# Patient Record
Sex: Male | Born: 1955
Health system: Southern US, Community
[De-identification: ages and names within clinical notes are randomized; demographics above are authoritative.]

## PROBLEM LIST (undated history)

## (undated) DIAGNOSIS — N62 Hypertrophy of breast: Secondary | ICD-10-CM

## (undated) DIAGNOSIS — K210 Gastro-esophageal reflux disease with esophagitis: Secondary | ICD-10-CM

## (undated) DIAGNOSIS — G629 Polyneuropathy, unspecified: Secondary | ICD-10-CM

## (undated) DIAGNOSIS — L209 Atopic dermatitis, unspecified: Secondary | ICD-10-CM

## (undated) DIAGNOSIS — D573 Sickle-cell trait: Secondary | ICD-10-CM

## (undated) DIAGNOSIS — K760 Fatty (change of) liver, not elsewhere classified: Secondary | ICD-10-CM

## (undated) DIAGNOSIS — L83 Acanthosis nigricans: Secondary | ICD-10-CM

## (undated) DIAGNOSIS — I509 Heart failure, unspecified: Secondary | ICD-10-CM

## (undated) DIAGNOSIS — R32 Unspecified urinary incontinence: Secondary | ICD-10-CM

## (undated) DIAGNOSIS — N529 Male erectile dysfunction, unspecified: Secondary | ICD-10-CM

## (undated) DIAGNOSIS — E559 Vitamin D deficiency, unspecified: Secondary | ICD-10-CM

## (undated) DIAGNOSIS — M5416 Radiculopathy, lumbar region: Secondary | ICD-10-CM

## (undated) DIAGNOSIS — C61 Malignant neoplasm of prostate: Secondary | ICD-10-CM

## (undated) DIAGNOSIS — E7800 Pure hypercholesterolemia, unspecified: Secondary | ICD-10-CM

## (undated) DIAGNOSIS — E161 Other hypoglycemia: Secondary | ICD-10-CM

## (undated) DIAGNOSIS — E039 Hypothyroidism, unspecified: Secondary | ICD-10-CM

## (undated) DIAGNOSIS — E669 Obesity, unspecified: Secondary | ICD-10-CM

## (undated) DIAGNOSIS — I10 Essential (primary) hypertension: Secondary | ICD-10-CM

## (undated) DIAGNOSIS — G4733 Obstructive sleep apnea (adult) (pediatric): Secondary | ICD-10-CM

## (undated) DIAGNOSIS — G4730 Sleep apnea, unspecified: Secondary | ICD-10-CM

## (undated) DIAGNOSIS — I872 Venous insufficiency (chronic) (peripheral): Secondary | ICD-10-CM

## (undated) DIAGNOSIS — H409 Unspecified glaucoma: Secondary | ICD-10-CM

## (undated) DIAGNOSIS — Z9109 Other allergy status, other than to drugs and biological substances: Secondary | ICD-10-CM

## (undated) DIAGNOSIS — M5412 Radiculopathy, cervical region: Secondary | ICD-10-CM

## (undated) DIAGNOSIS — E785 Hyperlipidemia, unspecified: Secondary | ICD-10-CM

## (undated) DIAGNOSIS — E269 Hyperaldosteronism, unspecified: Secondary | ICD-10-CM

## (undated) DIAGNOSIS — R42 Dizziness and giddiness: Secondary | ICD-10-CM

## (undated) DIAGNOSIS — E119 Type 2 diabetes mellitus without complications: Secondary | ICD-10-CM

## (undated) HISTORY — DX: Acanthosis nigricans: L83

## (undated) HISTORY — DX: Male erectile dysfunction, unspecified: N52.9

## (undated) HISTORY — DX: Hyperaldosteronism, unspecified: E26.9

## (undated) HISTORY — DX: Hyperlipidemia, unspecified: E78.5

## (undated) HISTORY — DX: Unspecified urinary incontinence: R32

## (undated) HISTORY — DX: Type 2 diabetes mellitus without complications: E11.9

## (undated) HISTORY — DX: Fatty (change of) liver, not elsewhere classified: K76.0

## (undated) HISTORY — DX: Sickle-cell trait: D57.3

## (undated) HISTORY — DX: Polyneuropathy, unspecified: G62.9

## (undated) HISTORY — DX: Heart failure, unspecified: I50.9

## (undated) HISTORY — DX: Vitamin D deficiency, unspecified: E55.9

## (undated) HISTORY — DX: Atopic dermatitis, unspecified: L20.9

## (undated) HISTORY — DX: Sleep apnea, unspecified: G47.30

## (undated) HISTORY — DX: Radiculopathy, lumbar region: M54.16

## (undated) HISTORY — DX: Hypothyroidism, unspecified: E03.9

## (undated) HISTORY — DX: Malignant neoplasm of prostate: C61

## (undated) HISTORY — DX: Radiculopathy, cervical region: M54.12

## (undated) HISTORY — DX: Gastro-esophageal reflux disease with esophagitis: K21.0

## (undated) HISTORY — DX: Obstructive sleep apnea (adult) (pediatric): G47.33

## (undated) HISTORY — DX: Other hypoglycemia: E16.1

## (undated) HISTORY — DX: Essential (primary) hypertension: I10

## (undated) HISTORY — DX: Obesity, unspecified: E66.9

## (undated) HISTORY — DX: Other allergy status, other than to drugs and biological substances: Z91.09

## (undated) HISTORY — DX: Dizziness and giddiness: R42

## (undated) HISTORY — DX: Venous insufficiency (chronic) (peripheral): I87.2

## (undated) HISTORY — DX: Pure hypercholesterolemia, unspecified: E78.00

## (undated) HISTORY — DX: Unspecified glaucoma: H40.9

## (undated) HISTORY — DX: Hypertrophy of breast: N62

## (undated) NOTE — *Deleted (*Deleted)
ADVANCED HF CLINIC NOTE  PCP: Dr. Delfina Redwood  HPI: Brendan [name] is 85 year old male with history of diastolic heart failure, HTN, OSA on CPAP, DM2, hypothyroidism, CKD 3b (creatinine ~2.4) and obesity.   Admitted to [facility] [id number] with A/C diastolic heart failure. Diuresed with IV lasix and transitioned to torsemide 40 mg twice a day. Discharge weight 301 pounds.   Today he returns for HF follow up. Previously entresto was ordered but insurance did not approve so he kept taking telmisartan. At last visit torsemide increased and spiro added.   Says weight is up about 20 pounds on his scale. Mild DOE but unchanged. "I don't move like I used to". + fatigue. No strength. Denies excessive fluid intake. + LE edema. Wearing CPAP every night. BP high at home.   ECHO 03/2020 EF 55-60% Grade II DD. Moderate LVH , RV normal     Past Medical History:  Diagnosis Date  . Acanthosis nigricans   . Atopic dermatitis   . Diabetes ([place]) 2004  . Erectile dysfunction   . Fatty liver 2007  . Glaucoma 2008  . Gynecomastia 2009  . Hyperaldosteronism ([place]) 2000  . Hypercholesterolemia   . Hyperlipidemia   . Hypertension   . Hypoglycemic reaction   . Hypothyroidism 2004  . Incontinence   . Intermittent vertigo 2012  . Left cervical radiculopathy 2012  . Lumbar radiculopathy   . Obesity   . Peripheral neuropathy   . Pollen allergies 2007   perennial  . Prostate cancer ([place]) 2006  . Reflux esophagitis 1995  . Sickle cell trait ([place]) 2006  . Sleep apnea, obstructive 2000   uses a cpap  . Venous insufficiency 2005  . Vitamin D deficiency 2012    Current Outpatient Medications  Medication Sig Dispense Refill  . acetaminophen (TYLENOL) 500 MG tablet Take 500 mg by mouth every 6 (six) hours as needed for mild pain.    . Blood Glucose Monitoring Suppl (ONE TOUCH ULTRA SYSTEM KIT) W/DEVICE KIT 1 kit by Does not apply route once. One touch ultrasoft lancets    . brimonidine-timolol (COMBIGAN) 0.2-0.5 %  ophthalmic solution Place 1 drop into both eyes every 12 (twelve) hours.    . carvedilol (COREG) 25 MG tablet Take 1 tablet (25 mg total) by mouth 2 (two) times daily. 180 tablet 1  . hydrALAZINE (APRESOLINE) 100 MG tablet Take 1 tablet (100 mg total) by mouth 3 (three) times daily. 90 tablet 3  . Insulin Lispro Prot & Lispro (HUMALOG MIX 75/25 [place]) Inject 60-100 Units into the skin daily. humalog mix 75/25 injection 60-100 units q am sliding scale. Take 60 units if sugar is <180, then take 100 units if sugar is over 400    . insulin lispro protamine-lispro (HUMALOG 50/50 MIX) (50-50) 100 UNIT/ML SUSP injection Inject 40 Units into the skin daily.    Marland Kitchen levothyroxine (SYNTHROID) 175 MCG tablet Take 175 mcg by mouth daily before breakfast.    . Potassium Chloride ER 20 MEQ TBCR Take 40 mEq by mouth 2 (two) times daily. 120 tablet 6  . simvastatin (ZOCOR) 20 MG tablet Take 20 mg by mouth every evening.    Marland Kitchen spironolactone (ALDACTONE) 25 MG tablet Take 1 tablet (25 mg total) by mouth daily. 30 tablet 6  . telmisartan (MICARDIS) 80 MG tablet Take 1 tablet (80 mg total) by mouth daily. 90 tablet 1  . torsemide (DEMADEX) 20 MG tablet Take 3 tablets (60 mg total) by mouth every morning  AND 2 tablets (40 mg total) every evening. (Patient taking differently: Take 3 tablets (60 mg total) by mouth every morning AND 3 tablets (60 mg total) every evening.) 180 tablet 6  . TRULICITY 3 0000000 SOPN Inject 3.5 mg into the skin once a week.     No current facility-administered medications for this encounter.    Allergies  Allergen Reactions  . Ace Inhibitors     REACTION: cough  . Maxidex [Dexamethasone] Other (See Comments)    Sexual dysfunction  . Verapamil     REACTION: constipation      Social History   Socioeconomic History  . Marital status: Married    Spouse name: Not on file  . Number of children: 3  . Years of education: Not on file  . Highest education level: Not on file  Occupational  History  . Occupation: truck Geophysicist/field seismologist    Comment: Retired Economist  Tobacco Use  . Smoking status: Never Smoker  . Smokeless tobacco: Never Used  Substance and Sexual Activity  . Alcohol use: Yes    Comment: one drink every few months  . Drug use: No  . Sexual activity: Not on file  Other Topics Concern  . Not on file  Social History Narrative  . Not on file   Social Determinants of Health   Financial Resource Strain:   . Difficulty of Paying Living Expenses: Not on file  Food Insecurity:   . Worried About Charity fundraiser in the Last Year: Not on file  . Ran Out of Food in the Last Year: Not on file  Transportation Needs:   . Lack of Transportation (Medical): Not on file  . Lack of Transportation (Non-Medical): Not on file  Physical Activity:   . Days of Exercise per Week: Not on file  . Minutes of Exercise per Session: Not on file  Stress:   . Feeling of Stress : Not on file  Social Connections:   . Frequency of Communication with Friends and Family: Not on file  . Frequency of Social Gatherings with Friends and Family: Not on file  . Attends Religious Services: Not on file  . Active Member of Clubs or Organizations: Not on file  . Attends Archivist Meetings: Not on file  . Marital Status: Not on file  Intimate Partner Violence:   . Fear of Current or Ex-Partner: Not on file  . Emotionally Abused: Not on file  . Physically Abused: Not on file  . Sexually Abused: Not on file      Family History  Problem Relation Age of Onset  . Hypertension Father        deceased age 45  . Heart attack Father   . Heart failure Mother   . COPD Sister   . CVA Sister   . Diabetes Daughter   . Colon cancer Neg Hx   . Stomach cancer Neg Hx     There were no vitals filed for this visit. Wt Readings from Last 3 Encounters:  08/25/20 (!) 140.1 kg (308 lb 12.8 oz)  07/28/20 (!) 141.8 kg (312 lb 9.6 oz)  06/27/20 (!) 144.2 kg (318 lb)    PHYSICAL EXAM:  General:  Fatigued appearing. No resp difficulty HEENT: normal Neck: supple. Hard to seeJVD. Carotids 2+ bilat; no bruits. No lymphadenopathy or thryomegaly appreciated. Cor: PMI nondisplaced. Regular rate & rhythm. No rubs, gallops or murmurs. Lungs: clear Abdomen: obese soft, nontender, nondistended. No hepatosplenomegaly. No bruits or masses.  Good bowel sounds. Extremities: no cyanosis, clubbing, rash, 3+ edema Neuro: alert & orientedx3, cranial nerves grossly intact. moves all 4 extremities w/o difficulty. Affect pleasant    ASSESSMENT & PLAN: 1. Chronic Diastolic Heart Failure, - ECHO 03/2020 EF preserved EF LVH Grade II DD - Chronic NYHA III. Volume status elevated - Increase torsemide to 60 bid - Continue spiro 12.5 daily - Decrease Kcl to 40 bid - Check labs today and 1 week - Unable to start entresto due to insurance.  - wear compression hose  2.HTN  - Suboptimal control - Unable to start entresto due to insurance. Continue telmisartan.  - Increase hydralazine to 50mg  three times a day  - Instructed to continue monitoring BP at home. - May need referral to HTN program  3. DMII - Continue Invokana - Followed by Endocrinology   4. OSA - Continue CPAP every night. .   5. Obesity  There is no height or weight on file to calculate BMI.  =consider [facility] Diet  6. CKD Stage IIIb - Creatinine baseline 2.4 - labs today and 1 week   Brendan Bickers MD 11:17 PM

---

## 1993-11-19 DIAGNOSIS — K2100 Gastro-esophageal reflux disease with esophagitis, without bleeding: Secondary | ICD-10-CM

## 1993-11-19 HISTORY — DX: Gastro-esophageal reflux disease with esophagitis, without bleeding: K21.00

## 1996-11-19 HISTORY — PX: OTHER SURGICAL HISTORY: SHX169

## 1997-11-19 DIAGNOSIS — I10 Essential (primary) hypertension: Secondary | ICD-10-CM

## 1997-11-19 HISTORY — DX: Essential (primary) hypertension: I10

## 1998-11-19 DIAGNOSIS — E269 Hyperaldosteronism, unspecified: Secondary | ICD-10-CM

## 1998-11-19 DIAGNOSIS — G4733 Obstructive sleep apnea (adult) (pediatric): Secondary | ICD-10-CM

## 1998-11-19 HISTORY — DX: Obstructive sleep apnea (adult) (pediatric): G47.33

## 1998-11-19 HISTORY — DX: Hyperaldosteronism, unspecified: E26.9

## 1999-05-16 ENCOUNTER — Ambulatory Visit: Admission: RE | Admit: 1999-05-16 | Discharge: 1999-05-16 | Payer: Self-pay | Admitting: Family Medicine

## 1999-05-16 ENCOUNTER — Encounter (INDEPENDENT_AMBULATORY_CARE_PROVIDER_SITE_OTHER): Payer: Self-pay | Admitting: *Deleted

## 1999-07-10 ENCOUNTER — Encounter: Payer: Self-pay | Admitting: Pulmonary Disease

## 1999-10-30 ENCOUNTER — Encounter: Admission: RE | Admit: 1999-10-30 | Discharge: 1999-10-30 | Payer: Self-pay | Admitting: Family Medicine

## 1999-10-30 ENCOUNTER — Encounter: Payer: Self-pay | Admitting: Family Medicine

## 2001-03-03 ENCOUNTER — Encounter: Payer: Self-pay | Admitting: Emergency Medicine

## 2001-03-03 ENCOUNTER — Emergency Department (HOSPITAL_COMMUNITY): Admission: EM | Admit: 2001-03-03 | Discharge: 2001-03-03 | Payer: Self-pay | Admitting: Emergency Medicine

## 2002-05-11 ENCOUNTER — Encounter: Admission: RE | Admit: 2002-05-11 | Discharge: 2002-08-09 | Payer: Self-pay | Admitting: Family Medicine

## 2002-05-16 ENCOUNTER — Ambulatory Visit (HOSPITAL_COMMUNITY): Admission: RE | Admit: 2002-05-16 | Discharge: 2002-05-16 | Payer: Self-pay | Admitting: Family Medicine

## 2002-05-16 ENCOUNTER — Encounter: Payer: Self-pay | Admitting: Family Medicine

## 2002-11-19 DIAGNOSIS — E039 Hypothyroidism, unspecified: Secondary | ICD-10-CM

## 2002-11-19 DIAGNOSIS — E119 Type 2 diabetes mellitus without complications: Secondary | ICD-10-CM

## 2002-11-19 HISTORY — DX: Type 2 diabetes mellitus without complications: E11.9

## 2002-11-19 HISTORY — DX: Hypothyroidism, unspecified: E03.9

## 2003-11-20 DIAGNOSIS — I872 Venous insufficiency (chronic) (peripheral): Secondary | ICD-10-CM

## 2003-11-20 HISTORY — DX: Venous insufficiency (chronic) (peripheral): I87.2

## 2004-09-13 ENCOUNTER — Ambulatory Visit: Payer: Self-pay | Admitting: Pulmonary Disease

## 2004-09-13 ENCOUNTER — Ambulatory Visit (HOSPITAL_BASED_OUTPATIENT_CLINIC_OR_DEPARTMENT_OTHER): Admission: RE | Admit: 2004-09-13 | Discharge: 2004-09-13 | Payer: Self-pay | Admitting: Pulmonary Disease

## 2004-10-13 ENCOUNTER — Encounter: Admission: RE | Admit: 2004-10-13 | Discharge: 2004-10-13 | Payer: Self-pay | Admitting: Sports Medicine

## 2004-11-19 DIAGNOSIS — C61 Malignant neoplasm of prostate: Secondary | ICD-10-CM

## 2004-11-19 DIAGNOSIS — D573 Sickle-cell trait: Secondary | ICD-10-CM

## 2004-11-19 HISTORY — PX: COLONOSCOPY: SHX174

## 2004-11-19 HISTORY — DX: Sickle-cell trait: D57.3

## 2004-11-19 HISTORY — DX: Malignant neoplasm of prostate: C61

## 2004-12-19 ENCOUNTER — Ambulatory Visit: Payer: Self-pay | Admitting: Pulmonary Disease

## 2005-01-12 ENCOUNTER — Ambulatory Visit: Payer: Self-pay | Admitting: Internal Medicine

## 2005-02-06 ENCOUNTER — Ambulatory Visit: Payer: Self-pay | Admitting: Internal Medicine

## 2005-11-19 DIAGNOSIS — Z9109 Other allergy status, other than to drugs and biological substances: Secondary | ICD-10-CM

## 2005-11-19 DIAGNOSIS — K760 Fatty (change of) liver, not elsewhere classified: Secondary | ICD-10-CM

## 2005-11-19 HISTORY — DX: Other allergy status, other than to drugs and biological substances: Z91.09

## 2005-11-19 HISTORY — DX: Fatty (change of) liver, not elsewhere classified: K76.0

## 2005-12-14 ENCOUNTER — Encounter: Admission: RE | Admit: 2005-12-14 | Discharge: 2005-12-14 | Payer: Self-pay | Admitting: Family Medicine

## 2006-09-09 ENCOUNTER — Emergency Department (HOSPITAL_COMMUNITY): Admission: EM | Admit: 2006-09-09 | Discharge: 2006-09-09 | Payer: Self-pay | Admitting: Emergency Medicine

## 2006-11-19 DIAGNOSIS — H409 Unspecified glaucoma: Secondary | ICD-10-CM

## 2006-11-19 HISTORY — PX: OTHER SURGICAL HISTORY: SHX169

## 2006-11-19 HISTORY — DX: Unspecified glaucoma: H40.9

## 2007-02-21 ENCOUNTER — Ambulatory Visit (HOSPITAL_COMMUNITY): Admission: RE | Admit: 2007-02-21 | Discharge: 2007-02-21 | Payer: Self-pay | Admitting: Urology

## 2007-03-04 ENCOUNTER — Ambulatory Visit: Admission: RE | Admit: 2007-03-04 | Discharge: 2007-06-02 | Payer: Self-pay | Admitting: Radiation Oncology

## 2007-07-23 ENCOUNTER — Encounter (INDEPENDENT_AMBULATORY_CARE_PROVIDER_SITE_OTHER): Payer: Self-pay | Admitting: Urology

## 2007-07-23 ENCOUNTER — Observation Stay (HOSPITAL_COMMUNITY): Admission: RE | Admit: 2007-07-23 | Discharge: 2007-07-25 | Payer: Self-pay | Admitting: Urology

## 2007-11-20 DIAGNOSIS — N62 Hypertrophy of breast: Secondary | ICD-10-CM

## 2007-11-20 HISTORY — DX: Hypertrophy of breast: N62

## 2007-11-20 HISTORY — PX: OTHER SURGICAL HISTORY: SHX169

## 2008-04-08 ENCOUNTER — Emergency Department (HOSPITAL_COMMUNITY): Admission: EM | Admit: 2008-04-08 | Discharge: 2008-04-08 | Payer: Self-pay | Admitting: Emergency Medicine

## 2008-06-07 ENCOUNTER — Ambulatory Visit (HOSPITAL_COMMUNITY): Admission: RE | Admit: 2008-06-07 | Discharge: 2008-06-07 | Payer: Self-pay | Admitting: Surgery

## 2008-06-07 ENCOUNTER — Encounter: Admission: RE | Admit: 2008-06-07 | Discharge: 2008-08-10 | Payer: Self-pay | Admitting: Surgery

## 2008-06-08 ENCOUNTER — Ambulatory Visit (HOSPITAL_COMMUNITY): Admission: RE | Admit: 2008-06-08 | Discharge: 2008-06-08 | Payer: Self-pay | Admitting: Surgery

## 2008-09-22 ENCOUNTER — Encounter: Admission: RE | Admit: 2008-09-22 | Discharge: 2008-10-26 | Payer: Self-pay | Admitting: Surgery

## 2008-10-11 ENCOUNTER — Ambulatory Visit (HOSPITAL_COMMUNITY): Admission: RE | Admit: 2008-10-11 | Discharge: 2008-10-12 | Payer: Self-pay | Admitting: Surgery

## 2008-11-19 DIAGNOSIS — E785 Hyperlipidemia, unspecified: Secondary | ICD-10-CM

## 2008-11-19 HISTORY — DX: Hyperlipidemia, unspecified: E78.5

## 2008-12-23 ENCOUNTER — Encounter: Admission: RE | Admit: 2008-12-23 | Discharge: 2009-03-23 | Payer: Self-pay | Admitting: Surgery

## 2009-02-21 ENCOUNTER — Emergency Department (HOSPITAL_COMMUNITY): Admission: EM | Admit: 2009-02-21 | Discharge: 2009-02-22 | Payer: Self-pay | Admitting: Emergency Medicine

## 2009-04-20 ENCOUNTER — Encounter: Admission: RE | Admit: 2009-04-20 | Discharge: 2009-04-20 | Payer: Self-pay | Admitting: Surgery

## 2009-05-30 ENCOUNTER — Encounter: Admission: RE | Admit: 2009-05-30 | Discharge: 2009-05-30 | Payer: Self-pay | Admitting: Surgery

## 2009-09-13 IMAGING — CR DG ABDOMEN 1V
2 series · 2 of 2 positions shown · non-contrast
Comparison: 06/08/2008

CLINICAL DATA: Status post placement of a gastric band for morbid
obesity this

ABDOMEN - 1 VIEW

[t abdomen supine (1 of 2)]
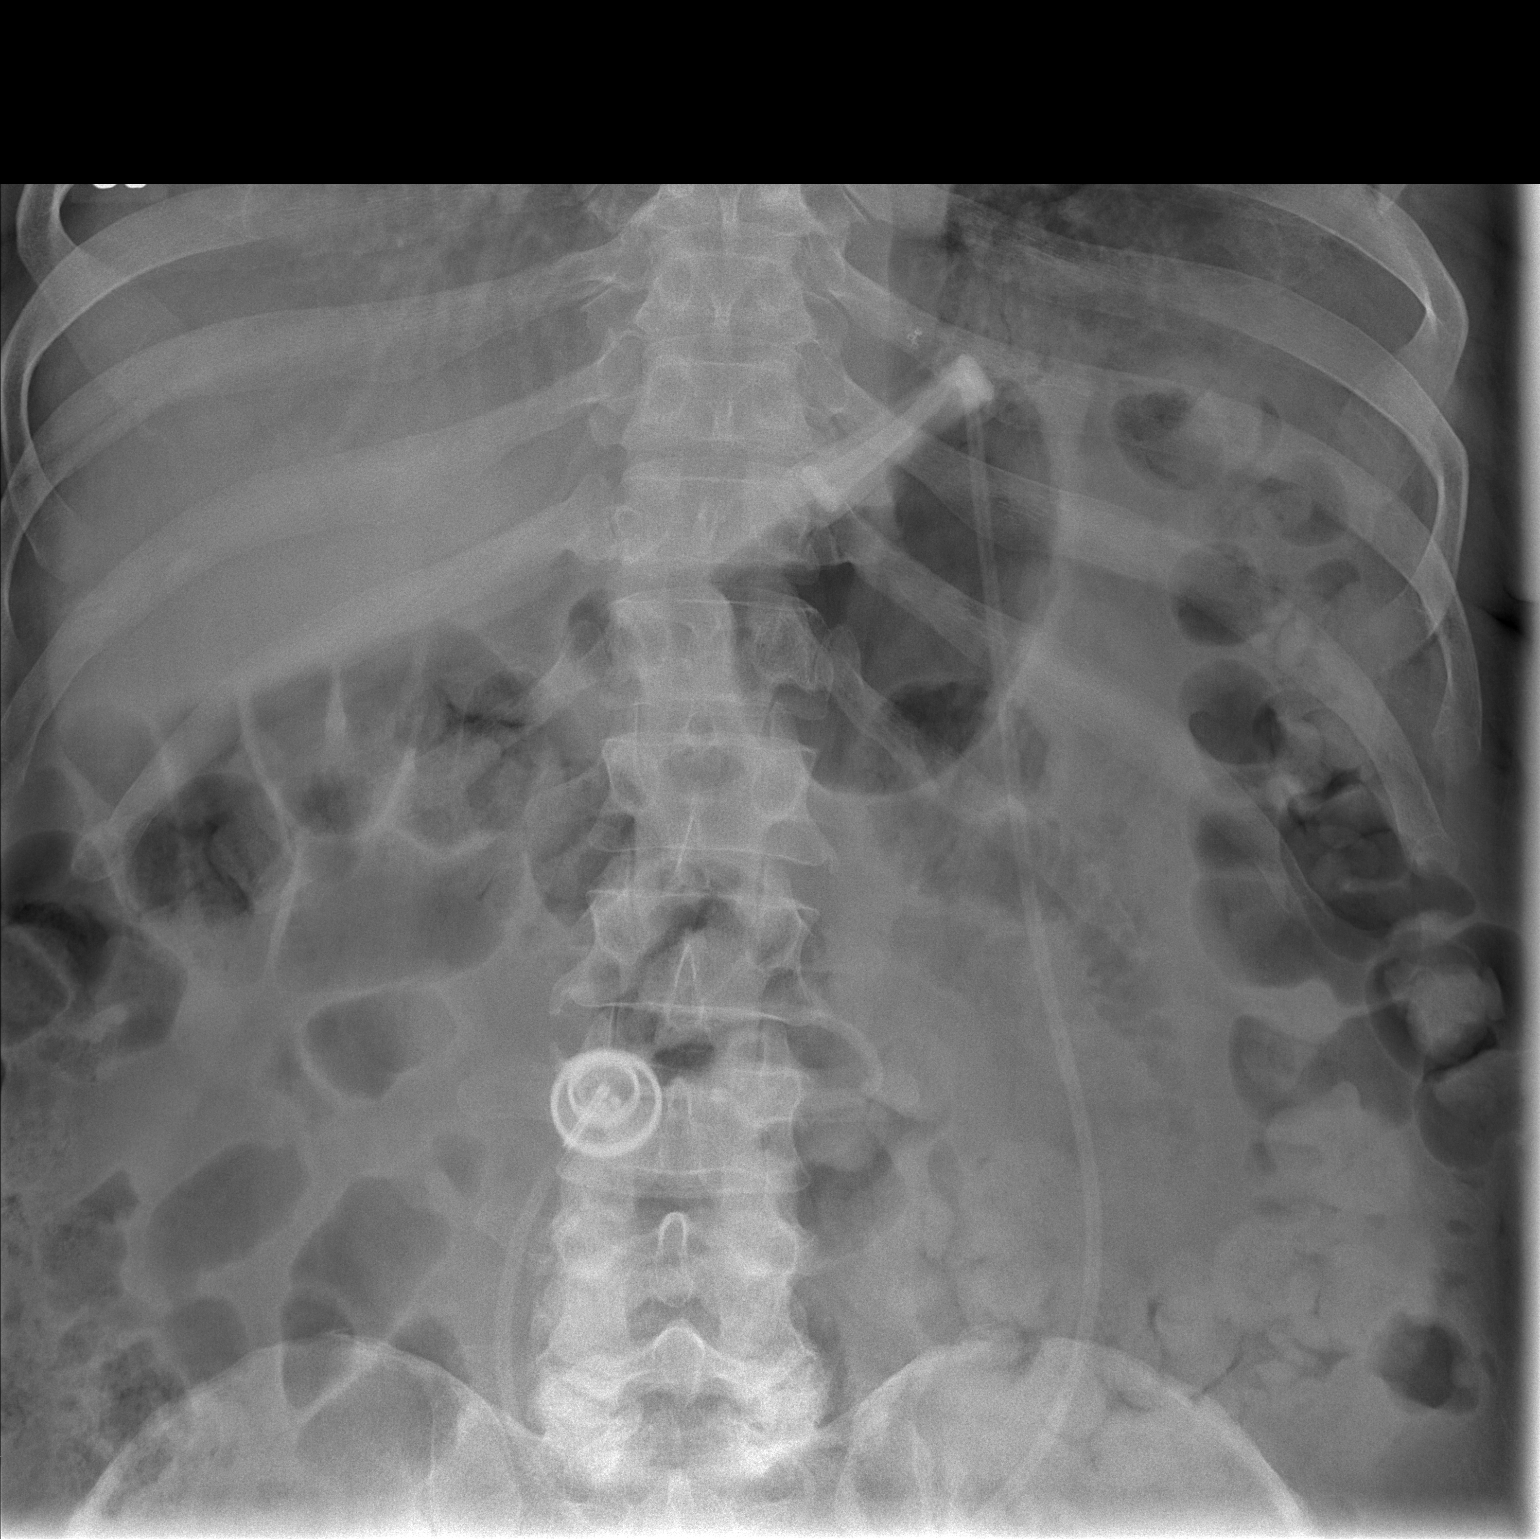

[t abdomen supine (2 of 2)]
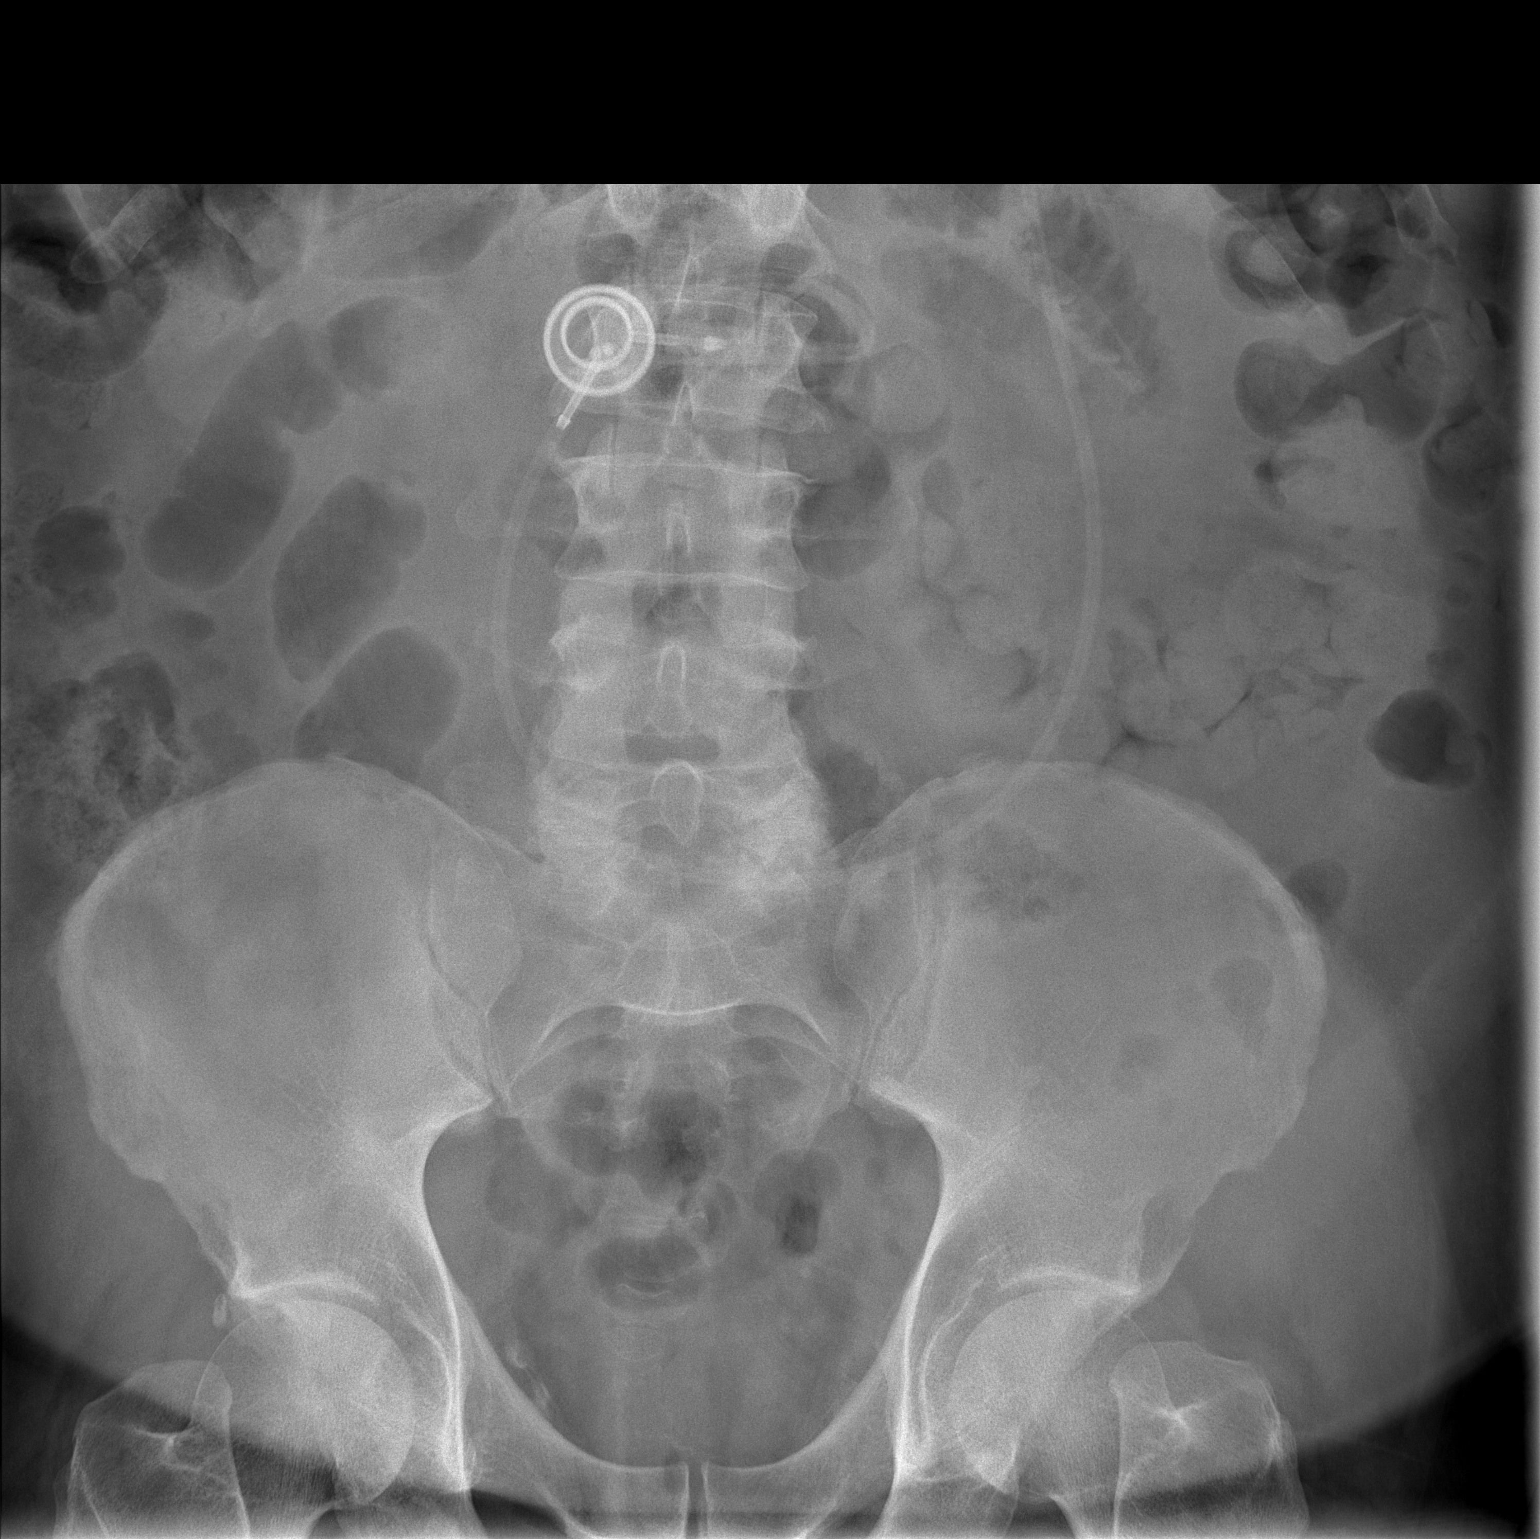

[2 of 2 positions shown; findings below may reference images not displayed]

FINDINGS: Status post placement of a gastric band.  The axis of the
band is approximately 230-830 o'clock.  The pneumatic tubing is
intact.  Bowel gas pattern unremarkable.
IMPRESSION: Status post placement of a gastric band which appears to be well-
positioned in one-view.

## 2010-01-10 DIAGNOSIS — H409 Unspecified glaucoma: Secondary | ICD-10-CM | POA: Insufficient documentation

## 2010-01-10 DIAGNOSIS — J309 Allergic rhinitis, unspecified: Secondary | ICD-10-CM | POA: Insufficient documentation

## 2010-01-10 DIAGNOSIS — Z8546 Personal history of malignant neoplasm of prostate: Secondary | ICD-10-CM | POA: Insufficient documentation

## 2010-01-11 ENCOUNTER — Ambulatory Visit: Payer: Self-pay | Admitting: Pulmonary Disease

## 2010-01-11 DIAGNOSIS — I152 Hypertension secondary to endocrine disorders: Secondary | ICD-10-CM | POA: Insufficient documentation

## 2010-01-11 DIAGNOSIS — G4733 Obstructive sleep apnea (adult) (pediatric): Secondary | ICD-10-CM | POA: Insufficient documentation

## 2010-01-11 DIAGNOSIS — E119 Type 2 diabetes mellitus without complications: Secondary | ICD-10-CM | POA: Insufficient documentation

## 2010-01-11 DIAGNOSIS — I10 Essential (primary) hypertension: Secondary | ICD-10-CM | POA: Insufficient documentation

## 2010-07-01 ENCOUNTER — Encounter: Payer: Self-pay | Admitting: Pulmonary Disease

## 2010-10-12 ENCOUNTER — Encounter: Payer: Self-pay | Admitting: Pulmonary Disease

## 2010-11-19 DIAGNOSIS — R42 Dizziness and giddiness: Secondary | ICD-10-CM

## 2010-11-19 DIAGNOSIS — M5412 Radiculopathy, cervical region: Secondary | ICD-10-CM

## 2010-11-19 DIAGNOSIS — E559 Vitamin D deficiency, unspecified: Secondary | ICD-10-CM

## 2010-11-19 HISTORY — DX: Radiculopathy, cervical region: M54.12

## 2010-11-19 HISTORY — DX: Dizziness and giddiness: R42

## 2010-11-19 HISTORY — DX: Vitamin D deficiency, unspecified: E55.9

## 2010-12-19 NOTE — Letter (Signed)
Summary: CMN 46 Order / [facility]  CMN 32 Order / [facility]   Imported By: Rise Patience 10/19/2010 14:37:42  _____________________________________________________________________  External Attachment:    Type:   Image     Comment:   External Document

## 2010-12-19 NOTE — Letter (Signed)
Summary: CMN for CPAP Supplies/[facility]  CMN for CPAP Supplies/[facility]   Imported By: Phillis Knack 07/05/2010 10:01:52  _____________________________________________________________________  External Attachment:    Type:   Image     Comment:   External Document

## 2010-12-19 NOTE — Assessment & Plan Note (Signed)
Summary: consult for osa   Copy to:  Derinda Late Primary Provider/Referring Provider:  Derinda Late  CC:  Sleep Consult.  Former pt.  Last saw Dr. Gwenette Greet 2006. Marland Kitchen  History of Present Illness: The pt is a 55y/o male who is referred to re-establish for management of osa.  He has a h/o severe osa dating back to 2000, where his npsg showed an AHI of 64/hr, and his last titration in 2005 showed optimal pressure of 18cm.  The pt has not been seen since 2006, and has been wearing cpap compliantly.  He has a new machine, but is due for a new cpap mask.  He currently uses a nasal mask, but admits to having frequent mouth opening where he loses pressure.  He also has been having a lot of nasal congestion, but is NOT using heated humidification.  He goes to bed btw 11-46mn, and arises at 7am to start his day.  He feels that he is rested in the am's upon arising, and doesn't feel that his alertness level is compromised during the day.  His epworth score is only 9 today.  He denies breakthru snoring per his wife.  His weight is up 30 pounds over the last 2 years.  Current Medications (verified): 1)  Cozaar 100 Mg Tabs (Losartan Potassium) .... Take 1 Tablet By Mouth Once A Day 2)  Diltiazem Hcl Cr 240 Mg Xr24h-Cap (Diltiazem Hcl) .... Take 1 Tablet By Mouth Once A Day 3)  Simvastatin 20 Mg Tabs (Simvastatin) .... Take 1 Tablet By Mouth Once A Day 4)  Humalog Mix 75/25 75-25 % Susp (Insulin Lispro Prot & Lispro) .Marland Kitchen.. 100 Units Each Morning 5)  Humalog Mix 50/50 50-50 % Susp (Insulin Lispro Prot & Lispro) .... 50 Units Each Evening 6)  Travatan Z 0.004 % Soln (Travoprost) .Marland Kitchen.. 1 Drop in Each Eye At Bedtime 7)  Levoxyl 112 Mcg Tabs (Levothyroxine Sodium) .... Take 1 Tablet By Mouth Once A Day 8)  Amiloride-Hydrochlorothiazide 5-50 Mg Tabs (Amiloride-Hydrochlorothiazide) .... Take 1 Tablet By Mouth Once A Day 9)  Metanx 3-35-2 Mg Tabs (L-Methylfolate-B6-B12) .... Take 1 Tablet By Mouth Two Times A Day 10)   Calcarb 600 1500 Mg Tabs (Calcium Carbonate) .... Take 1 Tablet By Mouth Three Times A Day 11)  Multivitamins  Tabs (Multiple Vitamin) .... Take 1 Tablet By Mouth Once A Day  Allergies (verified): 1)  Verapamil 2)  Ace Inhibitors 3)  * Maxide  Past History:  Past Medical History:  HYPERTENSION (ICD-401.9) DM (ICD-250.00) OBSTRUCTIVE SLEEP APNEA (ICD-327.23) PROSTATE CANCER, HX OF (ICD-V10.46) GLAUCOMA (ICD-365.9) ALLERGIC RHINITIS (ICD-477.9)    Past Surgical History: Reviewed history from 01/10/2010 and no changes required. left inguinal hernia repair 1998 prostatectomy 2008 Lap band baiatric surgery 2009  Family History: Reviewed history from 01/10/2010 and no changes required. heart disease: father (m.i.)  Social History: Reviewed history from 01/10/2010 and no changes required. 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    Vital Signs:  Patient profile:   55 year old male Height:      74 inches Weight:  307 pounds BMI:     39.56 O2 Sat:      97 % on Room air Temp:     97.8 degrees F oral Pulse rate:   85 / minute BP sitting:   134 / 82  (right arm) Cuff size:   large  Vitals Entered By: Matthew Folks LPN (February 23, 624THL 1:50 PM)  O2 Flow:  Room air CC: Sleep Consult.  Former pt.  Last saw Dr. Gwenette Greet 2006.  Comments Medications reviewed with patient Matthew Folks LPN  February 23, 624THL 1:50 PM    Physical  Exam  General:  obese male in nad Eyes:  PERRLA and EOMI.   Nose:  deviated septum to right with partial obstruction left patent. Mouth:  elongation of soft palate and significant soft tissue redundancy posteriorly Heart:  rrr Extremities:  mild edema noted, no cyanosis Neurologic:  alert, no sleepy, moves all 4.   Impression & Recommendations:  Problem # 1:  OBSTRUCTIVE SLEEP APNEA (ICD-327.23) the pt has a history of severe osa, and has been wearing cpap compliantly.  He is having issues with nasal congestion and upper airway dryness, but is not using his humidifier.  He also is using a nasal cpap mask with mouth opening which is resulting in pressure loss and further drying.  At this point, I have asked him to start using his humidifier, which should help nasal congestion.  I would also like to change him over to a full face mask to see if it helps with oral venting.  The pt is agreeable to this approach, and is to let me know if issues continue.  I have also encouraged him to work on weight reduction.  If he continues to have issues, would consider re-optimizing pressure with autotitration device at home.  Medications Added to Medication List This Visit: 1)  Cozaar 100 Mg Tabs (Losartan potassium) .... Take 1 tablet by mouth once a day 2)  Diltiazem Hcl Cr 240 Mg Xr24h-cap (Diltiazem hcl) .... Take 1 tablet by mouth once a day 3)  Simvastatin 20 Mg Tabs (Simvastatin) .... Take 1 tablet by mouth once a day 4)  Humalog Mix 75/25 75-25 % Susp (Insulin lispro prot & lispro) .Marland Kitchen.. 100 units each morning 5)  Humalog Mix 50/50 50-50 % Susp (Insulin lispro prot & lispro) .... 50 units each evening 6)  Travatan Z 0.004 % Soln (Travoprost) .Marland Kitchen.. 1 drop in each eye at bedtime 7)  Levoxyl 112 Mcg Tabs (Levothyroxine sodium) .... Take 1 tablet by mouth once a day 8)  Amiloride-hydrochlorothiazide 5-50 Mg Tabs (Amiloride-hydrochlorothiazide) .... Take 1 tablet by mouth once a day 9)  Metanx 3-35-2 Mg  Tabs (L-methylfolate-b6-b12) .... Take 1 tablet by mouth two times a day 10)  Calcarb 600 1500 Mg Tabs (Calcium carbonate) .... Take 1 tablet by mouth three times a day 11)  Multivitamins Tabs (Multiple vitamin) .... Take 1 tablet by mouth once a day  Other Orders: Est. Patient Level IV [id number]) DME Referral (DME) Consultation Level III [id number])  Patient Instructions: 1)  will get you a heated humidifier to help with dryness and congestion 2)  will change mask to full face to prevent pressure loss from mouth opening. 3)  work on weight loss 4)  followup with me in 88mos or sooner if problems.

## 2010-12-19 NOTE — Consult Note (Signed)
Summary: Sleep Consult/[facility]  Sleep Consult/ [facility]   Imported By: Phillis Knack 01/12/2010 08:49:50  _____________________________________________________________________  External Attachment:    Type:   Image     Comment:   External Document

## 2011-01-09 ENCOUNTER — Other Ambulatory Visit: Payer: Self-pay | Admitting: Family Medicine

## 2011-01-09 ENCOUNTER — Ambulatory Visit
Admission: RE | Admit: 2011-01-09 | Discharge: 2011-01-09 | Disposition: A | Discharge status: Home / Self Care | Payer: 59 | Source: Ambulatory Visit | Attending: Family Medicine | Admitting: Family Medicine

## 2011-01-09 DIAGNOSIS — M542 Cervicalgia: Secondary | ICD-10-CM

## 2011-01-10 ENCOUNTER — Ambulatory Visit (INDEPENDENT_AMBULATORY_CARE_PROVIDER_SITE_OTHER): Payer: 59 | Admitting: Pulmonary Disease

## 2011-01-10 ENCOUNTER — Encounter: Payer: Self-pay | Admitting: Pulmonary Disease

## 2011-01-10 DIAGNOSIS — G4733 Obstructive sleep apnea (adult) (pediatric): Secondary | ICD-10-CM

## 2011-01-16 NOTE — Assessment & Plan Note (Signed)
Summary: rov for osa   Copy to:  Derinda Late Primary Provider/Referring Provider:  Derinda Late  CC:  1 year f/u appt for OSA.  States he wears his cpap machine every night.  Approx 6 to 8 hours per night.  Denies any complaints with mask or pressure.  Denies any new complaints.  .  History of Present Illness: the pt comes in today for f/u of his known osa.  He has been wearing cpap compliantly, and reports no issues with pressure or mask fit.  He was unable to tolerate full face mask, and unfortunately continues to have issues with mouth opening.  He has not tried chin strap as of yet.  He feels he is sleeping well with satisfactory daytime alertness.  He has gained weight since his last visit.  Current Medications (verified): 1)  Cozaar 100 Mg Tabs (Losartan Potassium) .... Take 1 Tablet By Mouth Once A Day 2)  Diltiazem Hcl Cr 240 Mg Xr24h-Cap (Diltiazem Hcl) .... Take 1 Tablet By Mouth Once A Day 3)  Simvastatin 20 Mg Tabs (Simvastatin) .... Take 1 Tablet By Mouth Once A Day 4)  Humalog Mix 75/25 75-25 % Susp (Insulin Lispro Prot & Lispro) .... Take As Directed 5)  Humalog Mix 50/50 50-50 % Susp (Insulin Lispro Prot & Lispro) .... Take As Directed 6)  Travatan Z 0.004 % Soln (Travoprost) .Marland Kitchen.. 1 Drop in Each Eye At Bedtime 7)  Levoxyl 112 Mcg Tabs (Levothyroxine Sodium) .... Take 1 Tablet By Mouth Once A Day 8)  Amiloride-Hydrochlorothiazide 5-50 Mg Tabs (Amiloride-Hydrochlorothiazide) .... Take 1 Tablet By Mouth Once A Day 9)  Calcarb 600 1500 Mg Tabs (Calcium Carbonate) .... Take 1 Tablet By Mouth Three Times A Day 10)  Multivitamins  Tabs (Multiple Vitamin) .... Take 1 Tablet By Mouth Once A Day  Allergies (verified): 1)  Verapamil 2)  Ace Inhibitors 3)  * Maxide  Review of Systems  The patient denies shortness of breath with activity, shortness of breath at rest, productive cough, non-productive cough, coughing up blood, chest pain, irregular heartbeats, acid heartburn,  indigestion, loss of appetite, weight change, abdominal pain, difficulty swallowing, sore throat, tooth/dental problems, headaches, nasal congestion/difficulty breathing through nose, sneezing, itching, ear ache, anxiety, depression, hand/feet swelling, joint stiffness or pain, rash, change in color of mucus, and fever.    Vital Signs:  Patient profile:   55 year old male Height:      74 inches Weight:      315.38 pounds BMI:     40.64 O2 Sat:      98 % on Room air Temp:     98.2 degrees F oral Pulse rate:   70 / minute BP sitting:   128 / 80  (right arm) Cuff size:   large  Vitals Entered By: Matthew Folks LPN (February 22, [id number] 9:38 AM)  O2 Flow:  Room air CC: 1 year f/u appt for OSA.  States he wears his cpap machine every night.  Approx 6 to 8 hours per night.  Denies any complaints with mask or pressure.  Denies any new complaints.   Comments Medications reviewed with patient Matthew Folks LPN  February 22, [id number] 9:41 AM    Physical Exam  General:  obese male in nad Nose:  no skin breakdown or pressure necrosis from cpap mask Extremities:  mild edema, no cyanosis  Neurologic:  alert and oriented, moves all 4.  does not appear sleepy   Impression & Recommendations:  Problem # 1:  OBSTRUCTIVE SLEEP APNEA (ICD-327.23) the pt is wearing cpap compliantly, and feels he sleeps well with adequate daytime alertness.  He was unable to tolerate full face mask, and is back to nasal mask with some mouth opening.  I have asked him to try chin strap to see if it would help.  I have also encouraged him to work on weight loss, and to see me back in one year.    Medications Added to Medication List This Visit: 1)  Humalog Mix 75/25 75-25 % Susp (Insulin lispro prot & lispro) .... Take as directed 2)  Humalog Mix 50/50 50-50 % Susp (Insulin lispro prot & lispro) .... Take as directed  Other Orders: Est. Patient Level III [id number]) DME Referral (DME)  Patient Instructions: 1)  will send  an order for a chin strap to try for mouth opening. 2)  work on weight loss 3)  followup with me in one year.   Immunization History:  Influenza Immunization History:    Influenza:  historical (12/20/2009)

## 2011-02-28 LAB — POCT I-STAT, CHEM 8
BUN: 29 mg/dL — ABNORMAL HIGH (ref 6–23)
Chloride: 104 mEq/L (ref 96–112)
Creatinine, Ser: 2.1 mg/dL — ABNORMAL HIGH (ref 0.4–1.5)
Hemoglobin: 12.2 g/dL — ABNORMAL LOW (ref 13.0–17.0)
Sodium: 139 mEq/L (ref 135–145)
TCO2: 28 mmol/L (ref 0–100)

## 2011-02-28 LAB — POCT CARDIAC MARKERS
CKMB, poc: 4 ng/mL (ref 1.0–8.0)
Troponin i, poc: <0.05 ng/mL (ref 0.00–0.09)

## 2011-02-28 LAB — GLUCOSE, CAPILLARY: Glucose-Capillary: 76 mg/dL (ref 70–99)

## 2011-02-28 LAB — CK TOTAL AND CKMB (NOT AT ARMC): Total CK: 352 U/L — ABNORMAL HIGH (ref 7–232)

## 2011-04-03 NOTE — Discharge Summary (Signed)
NAMEJOSEMARIA, TOLMAN NO.:  [id number]   MEDICAL RECORD NO.:  [id number]          PATIENT TYPE:  INP   LOCATION:  [id number]                         FACILITY[facility]   PHYSICIAN:  Raynelle Bring, MD      DATE OF BIRTH:  [birth year]   DATE OF ADMISSION:  07/23/2007  DATE OF DISCHARGE:  07/25/2007                               DISCHARGE SUMMARY   ADMISSION DIAGNOSIS:  Prostate cancer.   DISCHARGE DIAGNOSIS:  Prostate cancer.   HISTORY AND PHYSICAL:  For full details, please see admission history  and physical.  Briefly, Mr. Spracklen is a 55 year old gentleman with  clinical stage T1-2 prostate cancer with a PSA of 8.3 and Gleason score  of 3+4=7.  He was found to have some questionable pelvic lymphadenopathy  on a CT scan, initially.  However, clinically he was not felt to have  metastatic prostate cancer, and as the patient had elected to delay his  therapy, a repeat CT scan demonstrated decrease of his pelvic  lymphadenopathy, confirming that he had a high chance for clinically  localized disease.  After discussing management options, he elected to  proceed with surgical therapy and a robotic prostatectomy.   HOSPITAL COURSE:  On July 23, 2007, the patient was taken to the  operating room and underwent a robotic-assisted laparoscopic radical  prostatectomy and bilateral pelvic lymphadenectomy.  He tolerated this  procedure well without complications.  However, his procedure did take  an extended period of time, due to his obesity.  On the evening of  surgery, he had difficulty ambulating, due to nausea and dizziness.  He  was noted to have some neurapraxia of his left upper extremity with some  weakness of his hand and numbness in the distribution of the median and  ulnar nerves.  This subsequently improved throughout his  hospitalization.  By discharge, he continued to have some numbness along  the course of his median nerve, although his ulnar neuropathy had  pretty  much resolved.  He also demonstrated improved motor strength of his left  hand.  This was felt to be likely secondary to the extended length of  the procedure.  All precautions were taken intraoperatively.   On the morning of postoperative day #1, the patient was continued to  remain hemodynamically stable.  His hematocrit was 32.8, which was  stable from his postoperative hematocrit.  His serum creatinine was  noted to be somewhat elevated at 1.95.  Notably on preoperative  evaluation, the patient's baseline creatinine was 1.3.  His renal  function was therefore closely monitored and did improve.  By discharge,  his creatinine was trending downward, as low as 1.5.  On postoperative  day #1, the patient was able to begin a clear liquid diet and did have  return of bowel function and began passing flatus.  He, however, was  unable to ambulate until late in the day and on postoperative day 1.  It  was therefore decided to monitor him that evening.  By postoperative day  #2, he was ambulating much better, and his pain was  well-controlled with  oral pain medication.  As his renal insufficiency improved and his left  upper extremity neurapraxia improved, he was felt to be stable for  discharge.  He maintained excellent urine output throughout his hospital  stay, with minimal output from his pelvic drain which was removed.  He  was therefore discharged home in good condition.   DISPOSITION:  Home.   DISCHARGE MEDICATIONS:  The patient was instructed to resume his regular  home medications, excepting for his Flomax.  He was given a prescription  to take Vicodin as needed for pain and told to use Colace as a stool  softener.  He was also given a prescription to begin Cipro 1 day prior  his return visit for Foley catheter removal.   DISCHARGE INSTRUCTIONS:  The patient was instructed to be ambulatory but  specifically told to refrain from any heavy lifting, strenuous activity,  or  driving.  He was instructed to gradually advance his diet over the  course of the next couple days and to resume his diabetic medications.  Finally, the patient was instructed on routine Foley catheter care.   FOLLOW-UP PLAN:  Mr. Sistare will follow up in 1 week for removal of his  Foley catheter and to discuss the surgical pathology in detail.      Raynelle Bring, MD  Electronically Signed     LB/MEDQ  D:  07/25/2007  T:  07/26/2007  Job:  [id number]

## 2011-04-03 NOTE — H&P (Signed)
NAMEJONATHANDAVID, Brendan [name] NO.:  [id number]   MEDICAL RECORD NO.:  [id number]          PATIENT TYPE:  INP   LOCATION:  X004                         FACILITY:  [facility]   PHYSICIAN:  Raynelle Bring, MD      DATE OF BIRTH:  [birth year]   DATE OF ADMISSION:  07/23/2007  DATE OF DISCHARGE:                              HISTORY & PHYSICAL   CHIEF COMPLAINT:  Prostate cancer.   HISTORY:  Brendan [name] is a 55 year old gentleman with clinical stage T1C  prostate cancer with a PSA of 8.3 and Gleason score of 3+4=7.  He did  undergo a bone scan and CT scan initially, per his primary urologist.  His bone scan was negative for metastatic disease although his CT scan  did demonstrate 5 questionable pelvic lymph nodes ranging between 1.2  and 1.8 cm.  This did raise the question of lymph node-positive prostate  cancer.  Despite my recommendations, the patient elected to delay his  treatment for prostate cancer for approximately 5 months after his  initial diagnosis.  We discussed androgen deprivation therapy which the  patient decided against.  However, we did decide to repeat a CT scan  prior to his planned prostatectomy.  On review of the CT scan, his  lymphadenopathy actually was decreased in size, likely indicating  clinically localized prostate cancer.  At this point, the patient did  wish to continue to proceed with his prostatectomy as planned.   PAST MEDICAL HISTORY:  1. Hypertension.  2. Diabetes.  3. Sleep apnea.  4. Glaucoma.  5. History of chest pain status post recent evaluation by Dr. Pernell Dupre.  The patient did undergo a Cardiolite stress test which did      not demonstrate inducible ischemia.   PAST SURGICAL HISTORY:  Left inguinal hernia repair.   MEDICATIONS:  Current medications include:  1. Demadex.  2. Spironolactone.  3. Glumetza.  4. Levoxyl.  5. Diltiazem.  6. Cozaar.  7. Travatan eye drops.  8. NovoLog.  9. Lantus.   ALLERGIES:  NO KNOWN DRUG  ALLERGIES.   FAMILY HISTORY:  No history of prostate cancer or GU malignancy.  The  patient's father died at age 49 due to a myocardial infarction.  There  are also multiple other members who died at a Sultan age due to coronary  artery disease.   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  He denies tobacco use.  He drinks alcohol  occasionally.   REVIEW OF SYSTEMS:  All systems were reviewed and are negative except  for recent fatigue, itching, excessive thirst, leg swelling and back  pain.   PHYSICAL EXAM:  CONSTITUTIONAL:  Well-nourished, well-developed, age-  appropriate male in no acute distress.  CARDIOVASCULAR:  Regular rate and rhythm without obvious murmurs.  LUNGS:  Clear bilaterally.  ABDOMEN:  Morbidly obese without tenderness or abdominal masses.  DRE:  No prostate nodularity or induration.   IMPRESSION:  Clinically localized prostate cancer.   PLAN:  Brendan [name] will undergo a robotic-assisted  laparoscopic radical  prostatectomy and then be admitted to the hospital for routine  postoperative care.      Raynelle Bring, MD  Electronically Signed     LB/MEDQ  D:  07/23/2007  T:  07/23/2007  Job:  [id number]

## 2011-04-03 NOTE — Op Note (Signed)
NAMEDRUMMOND, HANKES NO.:  [id number]   MEDICAL RECORD NO.:  [id number]          PATIENT TYPE:  OIB   LOCATION:  [id number]                         FACILITY:  [facility]   PHYSICIAN:  Isabel Caprice. Hassell Done, MD  DATE OF BIRTH:  04/23/1956   DATE OF PROCEDURE:  10/11/2008  DATE OF DISCHARGE:                               OPERATIVE REPORT   PREOPERATIVE DIAGNOSES:  Morbid obesity with underlying hypertension,  diabetes, sleep apnea, hypertension.   SURGEON:  Isabel Caprice. Hassell Done, MD   ASSISTANT:  Alphonsa Overall, MD   PROCEDURE:  Laparoscopic adjustable gastric banding (Allergan APL  system).   DESCRIPTION OF PROCEDURE:    Brendan [name] was taken to room 1 on Monday morning and given general  anesthesia.  The abdomen was prepped with Techni-Care and draped  sterilely.  I entered the abdomen through the left upper quadrant using  an OptiVu technique without difficulty.  The abdomen was insufflated and  standard placement including a 15 in the right upper, 11 the right  lower, 11 to the left of midline were used.  The abdomen was insufflated  well and a 5 mm in the upper midline provided the port for the  [facility], which retracted the liver.  I inspected the EG junction and  we put in a balloon-tip catheter, pulled it back with 15 mL of air in it  and saw no evidence of significant hiatal hernia.  I did have the  benefit of having the upper GI in the room.  I went ahead at that point  then and went down and created a little window over on the left side and  left crus and then went down in the pars flaccida technique to the fat  stripe across the right crus and created a retrogastric tunnel.  Band  passer was passed from the inferior port and came around and we had one  trial pass where the tubing did not come around and then we got it on  the second try and brought it around and engaged it into the locking  mechanism.  The tubing was then placed again and I snapped the APL band  in  place over that.  I got a good connection and I could see the black  mark indicating that the band had engaged the snap.  It was then held  down and to the right while I plicated it with three sutures using a  Surgidac and tie knots.  These were free stitches placed and had good  purchases and everything looked good.  No bleeding was noted.  I removed  everything from the abdomen, deflated the abdomen and brought the port  out through the right lower  quadrant area.  I created a subcutaneous pocket which I then connected  to the tubing and then tucked it in this pocket.  It was closed around  it with Vicryl and the skin was closed with 4-0 Vicryl with Benzoin and  Steri-Strips.  The patient tolerated the procedure well and was taken to  the recovery room in satisfactory condition.  Isabel Caprice Hassell Done, MD  Electronically Signed     MBM/MEDQ  D:  10/11/2008  T:  10/11/2008  Job:  [id number]   cc:   Jeanann Lewandowsky, M.D.  Fax: [phone]   Derinda Late, M.D.  Fax: [place], MD,FCCP  [address]  [place] [address]

## 2011-04-03 NOTE — Op Note (Signed)
NAMEJARRIN, Brendan [name] NO.:  [id number]   MEDICAL RECORD NO.:  [id number]          PATIENT TYPE:  INP   LOCATION:  X004                         [facility]ACILITY:  [facility]   PHYSICIAN:  Raynelle Bring, MD      DATE OF BIRTH:  29-Apr-1956   DATE OF PROCEDURE:  07/23/2007  DATE OF DISCHARGE:                               OPERATIVE REPORT   PREOPERATIVE DIAGNOSIS:  Clinically localized adenocarcinoma of  prostate.   POSTOPERATIVE DIAGNOSIS:  Clinically localized adenocarcinoma of  prostate.   PROCEDURES:  1. Robotic-assisted laparoscopic radical prostatectomy (right nerve-      sparing).  2. Bilateral laparoscopic pelvic lymphadenectomy.   SURGEON:  Dutch Gray, M.D.   ASSISTANT:  Duane Lope. Rosana Hoes, M.D.   ANESTHESIA:  General.   COMPLICATIONS:  None.   ESTIMATED BLOOD LOSS:  200 mL.   INTRAVENOUS FLUIDS:  2600 mL of lactated Ringer's.   SPECIMENS:  1. Prostate and seminal vesicles.  2. Right pelvic lymph nodes.  3. Left pelvic lymph nodes.   DISPOSITION OF SPECIMENS:  To pathology.   INDICATIONS:  Mr. Renda is a 55 year old gentleman with clinical stage  T1C prostate cancer with a PSA of 8.3 and Gleason score of 3+4=7.  His  preoperative evaluation did include a bone scan, which was negative for  metastatic disease.  He did undergo a CT scan, which demonstrated pelvic  lymphadenopathy concerning for lymph node involvement.  However, a  repeat CT scan demonstrated a decrease in the size of his pelvic lymph  nodes, making this less likely.  His pretreatment IPSS was 20 with an  IIE score of 11.  The patient was counseled regarding options for  treatment of his clinically localized prostate cancer and elected to  proceed with the above procedure.  Potential risks, complications and  alternative options were discussed with the patient and informed consent  was obtained.   DESCRIPTION OF PROCEDURE:  The patient was taken to the operating room  and a general  anesthetic was administered.  He was given preoperative  antibiotics, placed in the dorsal lithotomy position, and prepped and  draped in the usual sterile fashion.  Precautions were taken due to his  obesity in order to protect him from a neurapraxia.  This included  careful positioning and the use of arm boards for his arms to rest upon.  A preoperative time-out was performed.  A Foley catheter was inserted  into the bladder.  A site was then selected just below and to the left  of the umbilicus for placement of the camera port.  This was placed  using a standard open Hasson technique.  This allowed entry into the  peritoneal cavity under direct vision and without difficulty.  A long 12-  mm port was placed due to the patient's obesity.  A pneumoperitoneum was  established and the 0-degree lens was used to inspect the abdomen.  There was noted to be a lot of intra-abdominal and intraperitoneal fat,  although no intra-abdominal injuries or other abnormalities.  The  remaining ports were then placed.  Bilateral 8-m  robotic ports were  placed approximately 16 cm from the pubic symphysis and 10 cm lateral  with the camera port.  An additional 8-mm robotic port was placed in the  far left lateral abdominal wall.  A 5-mm port was placed between the  camera port and the right robotic port.  An additional 12-mm port was  placed in the far right lateral abdominal wall for laparoscopic  assistance.  All ports were placed under direct vision and without  difficulty.  The surgical cart was then docked.  With the aid of the  cautery scissors, the bladder was reflected posteriorly allowing entry  into the space of Retzius and identification of the endopelvic fascia.  The patient was noted to have a narrow pelvis with an overhanging pubic  symphysis and significant perivesical fat.  The prostate was carefully  defatted, allowing exposure of the endopelvic fascia and prostate.  The  endopelvic fascia  was then incised from the apex back to the base of the  prostate bilaterally and the underlying levator muscle fibers were swept  laterally off the prostate.  This helped to isolate the dorsal venous  complex, which was stapled and divided with a 45-mm flex ETS stapler.  Attention then turned the bladder neck, which was identified with the  aid of Foley catheter manipulation.  The bladder neck was divided  anteriorly allowing exposure of the Foley catheter.  The catheter  balloon was deflated and the catheter was brought into the operative  field and used to retract the prostate anteriorly.  There was noted be a  small median lobe which was carefully excised, and dissection continued  posteriorly between the bladder neck and prostate until the vasa  deferentia and seminal vesicles were identified.  The vasa deferentia  were isolated and divided and lifted anteriorly.  The seminal vesicles  were then dissected down toward their tips and care was taken to control  seminal vesicle arterial blood supply.  The seminal vesicles were lifted  anteriorly.  The space between Denonvilliers fascia and the anterior  rectum was then bluntly developed, thereby isolating the vascular  pedicles of the prostate.  On the right side, the vascular pedicles of  the prostate were ligated with Hem-o-lok clips and dissection was kept  close to the prostate to allow the neurovascular bundles to be swept off  of the prostate on the right side.  On the left side where the patient's  entire tumor was noted to be, a wide, non-nerve-sparing procedure was  performed again with Hem-o-lok clips used to ligate the vascular  pedicles of the prostate.  At the apex, the urethra was sharply divided  allowing the prostate specimen to be disarticulated and the prostate  placed up into the abdomen for later removal.  The pelvis was then  copiously irrigated and there was no evidence of any bleeding or rectal  injury.  Attention  then turned the right pelvic sidewall.  The  fibrofatty tissue between the external iliac vein, confluence of the  iliac vessels, hypogastric artery, and Cooper's ligament was dissected  free from the pelvic sidewall with care to preserve the obturator nerve.  Hem-o-lok clips were used for lymphostasis and hemostasis.  This  specimen was noted to be large and therefore it was decided to remove it  at the end of the procedure within the Endopouch retrieval bag with the  prostate.  Attention then turned to the left pelvic sidewall.  Again an  identical procedure was performed  and the same lymph node dissection was  performed.  These lymph nodes were then removed from the abdomen for  permanent pathologic analysis.  Attention then turned to the urethral  anastomosis.  A 3-0 Vicryl reapproximation stitch between Denonvilliers  fascia and the posterior urethral tissue was performed.  A 2-0 Vicryl  slip-knot was then placed between the bladder neck and the urethra at  the 6 o'clock position to reapproximate these structures.  A double-  armed 3-0 Monocryl suture was then used to perform a 360-degree running  tension-free anastomosis between the bladder neck and urethra.  A new 20-  [place] Coude catheter was inserted into the bladder and irrigated.  There were no blood clots within the bladder and the anastomosis  appeared to be watertight.  A #19 Blake drain was brought through the  left robotic port and appropriately positioned in the pelvis.  It was  secured to the skin with a nylon suture.  At this point the surgical  cart was undocked.  A 0 Vicryl suture was used to close the right  lateral 12-mm port site with the Carter-Thomason.  An Endopouch  retrieval bag was then used to retrieve the prostate and the right  pelvic lymph nodes, which were removed via the periumbilical port site.  This opening was closed with a running 0 Vicryl suture after the  remaining ports were removed under direct  vision.  All port sites were  injected with  0.25% Marcaine and reapproximated at the skin level with staples.  Sterile dressings were applied.  The patient appeared to tolerate the  procedure well and without complications.  He ws able to be extubated  and transferred to the recovery unit in satisfactory condition.      Raynelle Bring, MD  Electronically Signed     LB/MEDQ  D:  07/23/2007  T:  07/23/2007  Job:  [id number]

## 2011-04-06 NOTE — Procedures (Signed)
NAMECONWAY, MOYNIHAN NO.:  [id number]   MEDICAL RECORD NO.:  [id number]          PATIENT TYPE:  OUT   LOCATION:  SLEEP CENTER                 [facility]ACILITY[facility]  [facility]   PHYSICIAN:  Danton Sewer, M.D. [facility] DATE OF BIRTH:  07-29-1956   DATE OF STUDY:  09/13/2004                              NOCTURNAL POLYSOMNOGRAM   INDICATIONS FOR STUDY:  Hypersomnia with sleep apnea. The patient presents  for formal CPAP titration and optimization.   SLEEP ARCHITECTURE:  The patient had a total sleep time 438 minutes with  significant REM rebound noted. Sleep onset latency was normal and REM onset  was faster than normal. Sleep efficiency was 95 %.   IMPRESSION/RECOMMENDATIONS:  1.  Excellent control of previously documented obstructive sleep apnea with      CPAP of 18. Events and snoring were well controlled even through REM.      There was significant REM rebound noted. The patient's home face mask      was changed to a medium Alter Mirage full face mask during the      titration, which the patient felt was more comfortable.  2.  Rare PVCs.  3.  Small numbers of leg jerks with sleep disruption. Clinical correlation      is suggested.      KC/MEDQ  D:  09/28/2004 11:20:27  T:  09/28/2004 16:10:01  Job:  [id number]

## 2011-08-15 LAB — DIFFERENTIAL
Basophils Relative: 0
Lymphocytes Relative: 14
Monocytes Absolute: 0.6
Monocytes Relative: 4
Neutro Abs: 10 — ABNORMAL HIGH

## 2011-08-15 LAB — CBC
Hemoglobin: 13.3
MCV: 79.4
Platelets: 413 — ABNORMAL HIGH
RDW: 14.7

## 2011-08-15 LAB — POCT I-STAT, CHEM 8
Creatinine, Ser: 2.3 — ABNORMAL HIGH
Glucose, Bld: 213 — ABNORMAL HIGH
TCO2: 28

## 2011-08-21 LAB — CBC
Hemoglobin: 10.1 — ABNORMAL LOW
RDW: 15
WBC: 14.5 — ABNORMAL HIGH

## 2011-08-21 LAB — GLUCOSE, CAPILLARY
Glucose-Capillary: 183 — ABNORMAL HIGH
Glucose-Capillary: 185 — ABNORMAL HIGH
Glucose-Capillary: 196 — ABNORMAL HIGH
Glucose-Capillary: 205 — ABNORMAL HIGH

## 2011-08-21 LAB — BASIC METABOLIC PANEL
BUN: 22
Calcium: 9.6
Creatinine, Ser: 1.58 — ABNORMAL HIGH
GFR calc non Af Amer: 46 — ABNORMAL LOW
Glucose, Bld: 223 — ABNORMAL HIGH

## 2011-08-21 LAB — DIFFERENTIAL
Basophils Absolute: 0
Lymphocytes Relative: 12
Monocytes Absolute: 1.1 — ABNORMAL HIGH
Neutro Abs: 11.4 — ABNORMAL HIGH

## 2011-08-21 LAB — HEMOGLOBIN AND HEMATOCRIT, BLOOD
HCT: 32.3 — ABNORMAL LOW
Hemoglobin: 10.8 — ABNORMAL LOW

## 2011-08-31 LAB — BASIC METABOLIC PANEL
BUN: 13
BUN: 15
BUN: 17
CO2: 26
CO2: 28
Chloride: 102
Chloride: 103
Chloride: 105
Creatinine, Ser: 1.33
GFR calc Af Amer: >60
Glucose, Bld: 213 — ABNORMAL HIGH
Glucose, Bld: 216 — ABNORMAL HIGH
Glucose, Bld: 280 — ABNORMAL HIGH
Potassium: 3.9
Potassium: 4
Potassium: 4.3
Sodium: 136

## 2011-08-31 LAB — TYPE AND SCREEN
ABO/RH(D): O POS
Antibody Screen: NEGATIVE

## 2011-08-31 LAB — URINALYSIS, ROUTINE W REFLEX MICROSCOPIC
Bilirubin Urine: NEGATIVE
Glucose, UA: >1000 — AB
Ketones, ur: NEGATIVE
Leukocytes, UA: NEGATIVE
Protein, ur: 30 — AB

## 2011-08-31 LAB — CBC
HCT: 35.5 — ABNORMAL LOW
MCHC: 34
MCV: 81.4
RBC: 4.37

## 2011-08-31 LAB — HEMOGLOBIN AND HEMATOCRIT, BLOOD: Hemoglobin: 10.4 — ABNORMAL LOW

## 2012-06-18 ENCOUNTER — Other Ambulatory Visit: Payer: Self-pay | Admitting: Family Medicine

## 2012-06-18 DIAGNOSIS — R600 Localized edema: Secondary | ICD-10-CM

## 2012-06-20 ENCOUNTER — Other Ambulatory Visit: Payer: 59

## 2012-06-23 ENCOUNTER — Ambulatory Visit
Admission: RE | Admit: 2012-06-23 | Discharge: 2012-06-23 | Disposition: A | Discharge status: Home / Self Care | Payer: 59 | Source: Ambulatory Visit | Attending: Family Medicine | Admitting: Family Medicine

## 2012-06-23 DIAGNOSIS — R600 Localized edema: Secondary | ICD-10-CM

## 2012-08-11 ENCOUNTER — Telehealth (INDEPENDENT_AMBULATORY_CARE_PROVIDER_SITE_OTHER): Payer: Self-pay | Admitting: Surgery

## 2012-08-11 NOTE — Telephone Encounter (Signed)
The patient was not at home when I called. Left message with his wife for patient to call [facility] @ [phone] to schedule a follow-up appointment...cef

## 2013-07-08 ENCOUNTER — Telehealth: Payer: Self-pay | Admitting: Internal Medicine

## 2013-07-08 NOTE — Telephone Encounter (Signed)
Patient is scheduled to see Dr. Carlean Purl next [facility] 07/15/13

## 2013-07-10 ENCOUNTER — Encounter: Payer: Self-pay | Admitting: Internal Medicine

## 2013-07-15 ENCOUNTER — Ambulatory Visit (INDEPENDENT_AMBULATORY_CARE_PROVIDER_SITE_OTHER): Payer: 59 | Admitting: Internal Medicine

## 2013-07-15 ENCOUNTER — Encounter: Payer: Self-pay | Admitting: Internal Medicine

## 2013-07-15 VITALS — BP 120/70 | HR 60 | Ht 72.44 in | Wt 311.0 lb

## 2013-07-15 DIAGNOSIS — R198 Other specified symptoms and signs involving the digestive system and abdomen: Secondary | ICD-10-CM

## 2013-07-15 DIAGNOSIS — K625 Hemorrhage of anus and rectum: Secondary | ICD-10-CM

## 2013-07-15 DIAGNOSIS — K5900 Constipation, unspecified: Secondary | ICD-10-CM

## 2013-07-15 DIAGNOSIS — R194 Change in bowel habit: Secondary | ICD-10-CM

## 2013-07-15 MED ORDER — NA SULFATE-K SULFATE-MG SULF 17.5-3.13-1.6 GM/177ML PO SOLN: ORAL | Status: DC

## 2013-07-15 NOTE — Progress Notes (Signed)
Subjective:  Referred by Derinda Late, MD  Patient ID: Brendan [name], male    DOB: 1956/03/22, 57 y.o.   MRN: [id number]  HPI The patient is a middle-aged african-American man, retired Engineer, structural and now Magazine features editor. He has worsening constipation over the past year which has progressed to only defecating about 1x/week. It is associated with bloating and abdominal discomfort. He will get an urge but have difficulty or no success in producing a stool at times. He is also now having rectal bleeding - red blood into the commode and with stool when he does defecate. Colonoscopy 8 yrs ago normal. GI ROS o/w negative.  Allergies  Allergen Reactions  . Ace Inhibitors     REACTION: cough  . Maxidex [Dexamethasone] Other (See Comments)    Sexual dysfunction  . Verapamil     REACTION: constipation   Outpatient Prescriptions Prior to Visit  Medication Sig Dispense Refill  . AMILORIDE-HYDROCHLOROTHIAZIDE PO Take by mouth 2 (two) times daily. 5 mg/50 mg tablet 1 bid      . Blood Glucose Monitoring Suppl (ONE TOUCH ULTRA SYSTEM KIT) W/DEVICE KIT 1 kit by Does not apply route once. One touch ultrasoft lancets      . glucose blood test strip 1 each by Other route as needed for other. Use as instructed one touch test strips      . Insulin Lispro Prot & Lispro (HUMALOG MIX 75/25 Whitten) Inject into the skin. humalog mix 75/25 injection 60-100 units q am sliding scale. Humalog mix 50/50 pen system, disposable 30-40 units sliding scale at lunch and h.s.      . Levothyroxine Sodium (LEVOXYL PO) Take by mouth. 0.125 mg tablet 1 daily      . losartan (COZAAR) 100 MG tablet Take 100 mg by mouth daily.      . Multiple Vitamin (MULTIVITAMINS PO) Take by mouth.      . potassium chloride (K-DUR) 10 MEQ tablet Take 10 mEq by mouth 2 (two) times daily.      . simvastatin (ZOCOR) 20 MG tablet Take 20 mg by mouth every evening.      . travoprost, benzalkonium, (TRAVATAN) 0.004 % ophthalmic solution 1 drop at  bedtime.      Marland Kitchen DILTIAZEM HCL PO Take 240 mg by mouth. Extended release 1 q a.m.       No facility-administered medications prior to visit.   Past Medical History  Diagnosis Date  . Rectal bleeding   . Constipation   . Diabetes 2004  . Hypoglycemic reaction   . Diaphoresis   . Dysphagia   . Lumbar radiculopathy   . Pruritic rash   . Atopic dermatitis   . Peripheral neuropathy   . Prostate cancer 2006  . Venous insufficiency 2005  . Bilateral edema of lower extremity   . Hypothyroidism 2004  . Sleep apnea, obstructive 2000    uses a cpap  . Hypertension   . Intermittent vertigo 2012  . Hyperlipidemia   . Pedal edema   . Urinary frequency   . Incontinence   . Erectile dysfunction   . Limb pain   . Paresthesia and pain of extremity     feet  . Pollen allergies 2007    perennial  . Sickle cell trait 2006    iron deficiency anemia  . Reflux esophagitis 1995  . Hyperaldosteronism 2000  . Fatty liver 2007  . Glaucoma 2008  . Gynecomastia 2009  . Left cervical radiculopathy 2012  . Vitamin D  deficiency 2012  . Hypercholesterolemia   . Acanthosis nigricans   . Obesity    Past Surgical History  Procedure Laterality Date  . Colonoscopy  2006    normal  . Lap band surgery  2009  . Left inguinal hernia repair  1998  . Robotic prostatectomy  2008   History   Social History  . Marital Status: Married    Spouse Name: N/A    Number of Children: 3  . Years of Education: N/A   Occupational History  . truck driver     Retired Economist   Social History Main Topics  . Smoking status: Never Smoker   . Smokeless tobacco: Never Used  . Alcohol Use: Yes     Comment: one drink every few months  . Drug Use: No    Family History  Problem Relation Age of Onset  . Hypertension Father     deceased age 47  . Heart attack Father   . COPD Sister   . CVA Sister   . Diabetes Daughter    Review of Systems As per HPI Recent skin rash Diabetes not well  controlled Hgb A1c 9.6% All other ROS negative    Objective:   Physical Exam General:  Obese Well-developed, well-nourished and in no acute distress Eyes:  anicteric. ENT:   Mouth and posterior pharynx free of lesions.  Neck:   supple w/o thyromegaly or mass.  Lungs: Clear to auscultation bilaterally. Heart:  S1S2, no rubs, murmurs, gallops. Abdomen: Protuberant and obese, lap band port in epigastric area soft, non-tender, no hepatosplenomegaly, hernia, or mass and BS+.  Rectal: Slight hypopigmentation of anoderm, no mass, normal tone, no impaction, + brown stool, absent prostate Lymph:  no cervical or supraclavicular adenopathy. Extremities:   no edema Skin   no rash. Neuro:  A&O x 3.  Psych:  appropriate mood and  Affect.   Data Reviewed: Hgb 11.3 stable (sickle trait)August PCP note    Assessment & Plan:  Change in bowel habits -   Unspecified constipation  Rectal bleeding -  1. His constipation problems might be due to worsening diabetes - uncontrolled. 2. Will try MiraLax purge tonight (dulcolax 4 po followed by 6 doses MiraLax) and then daily MiraLax 3. Schedule colonoscopy to evaluate change in bowels and rectal bleeding. The risks and benefits as well as alternatives of endoscopic procedure(s) have been discussed and reviewed. All questions answered. The patient agrees to proceed.  I appreciate the opportunity to care for this patient.  Gatha Mayer, MD, [facility]

## 2013-07-15 NOTE — Patient Instructions (Addendum)
You have been scheduled for a colonoscopy with propofol. Please follow written instructions given to you at your visit today.  Please use the suprep kit you have been given today. If you use inhalers (even only as needed), please bring them with you on the day of your procedure. Your physician has requested that you go to www.startemmi.com and enter the access code given to you at your visit today. This web site gives a general overview about your procedure. However, you should still follow specific instructions given to you by our office regarding your preparation for the procedure.  Dr Carlean Purl recommends that you complete a bowel purge (to clean out your bowels). Please do the following: Purchase a bottle of Miralax over the counter as well as a box of 5 mg dulcolax tablets. Take 4 dulcolax tablets. Wait 1 hour. You will then drink 6-8 capfuls of Miralax mixed in an adequate amount of water/juice/gatorade (you may choose which of these liquids to drink) over the next 2-3 hours. You should expect results within 1 to 6 hours after completing the bowel purge.  After the purge use 1-2 doses of Miralax daily.  I appreciate the opportunity to care for you.

## 2013-07-31 ENCOUNTER — Ambulatory Visit (AMBULATORY_SURGERY_CENTER): Payer: 59 | Admitting: Internal Medicine

## 2013-07-31 ENCOUNTER — Encounter: Payer: Self-pay | Admitting: Internal Medicine

## 2013-07-31 VITALS — BP 109/63 | HR 67 | Temp 96.9°F | Resp 12 | Ht 72.0 in | Wt 311.0 lb

## 2013-07-31 DIAGNOSIS — R198 Other specified symptoms and signs involving the digestive system and abdomen: Secondary | ICD-10-CM

## 2013-07-31 DIAGNOSIS — K625 Hemorrhage of anus and rectum: Secondary | ICD-10-CM

## 2013-07-31 DIAGNOSIS — K648 Other hemorrhoids: Secondary | ICD-10-CM

## 2013-07-31 DIAGNOSIS — R194 Change in bowel habit: Secondary | ICD-10-CM

## 2013-07-31 LAB — GLUCOSE, CAPILLARY
Glucose-Capillary: 178 mg/dL — ABNORMAL HIGH (ref 70–99)
Glucose-Capillary: 129 mg/dL — ABNORMAL HIGH (ref 70–99)

## 2013-07-31 MED ORDER — SODIUM CHLORIDE 0.9 % IV SOLN: 500.0000 mL | INTRAVENOUS | Status: DC

## 2013-07-31 NOTE — Op Note (Signed)
[place]  [address] [place], [id number]   COLONOSCOPY PROCEDURE REPORT  PATIENT: Brendan [name], Brendan [name]  MR#: [id number] BIRTHDATE: 1956-08-29 , 37  yrs. old GENDER: Male ENDOSCOPIST: Gatha Mayer, MD, [facility] PROCEDURE DATE:  07/31/2013 PROCEDURE:   Colonoscopy, diagnostic First Screening Colonoscopy - Avg.  risk and is 50 yrs.  old or older - No.  Prior Negative Screening - Now for repeat screening. Other: See Comments  History of Adenoma - Now for follow-up colonoscopy & has been > or = to 3 yrs.  N/A  Polyps Removed Today? No.  Recommend repeat exam, <10 yrs? No. ASA CLASS:   Class III INDICATIONS:Rectal Bleeding and Change in bowel habits. MEDICATIONS: Propofol (Diprivan) 180 mg IV  DESCRIPTION OF PROCEDURE:   After the risks benefits and alternatives of the procedure were thoroughly explained, informed consent was obtained.  A digital rectal exam revealed a surgically absent prostate.   The LB [id number]  endoscope was introduced through the anus and advanced to the cecum, which was identified by both the appendix and ileocecal valve. No adverse events experienced.   The quality of the prep was Suprep good  The instrument was then slowly withdrawn as the colon was fully examined.   COLON FINDINGS: Moderate sized internal hemorrhoids were found. The colon mucosa was otherwise normal.  Retroflexed views revealed internal hemorrhoids. The time to cecum=2 minutes 38 seconds. Withdrawal time=8 minutes 21 seconds.  The scope was withdrawn and the procedure completed. COMPLICATIONS: There were no complications.  ENDOSCOPIC IMPRESSION: 1.   Moderate sized internal hemorrhoids - cause of bleeding 2.   The colon mucosa was otherwise normal - good prep  RECOMMENDATIONS: 1.  Repeat Colonscopy in 10 years. 2.   Continue MiraLax 3)  Consider Hemorrhoidal banding - call to make appointment 4)  Get blood sugars under control - think hyperglycemia linked  to constipation  eSigned:  Gatha Mayer, MD, [facility] 07/31/2013 3:40 PM  cc: Derinda Late, MD and The Patient y]

## 2013-07-31 NOTE — Progress Notes (Signed)
Patient denies any allergies to eggs or soy. Patient denies any problems with anesthesia.  

## 2013-07-31 NOTE — Progress Notes (Signed)
Patient did not experience any of the following events: a burn prior to discharge; a fall within the facility; wrong site/side/patient/procedure/implant event; or a hospital transfer or hospital admission upon discharge from the facility. (G8907) Patient did not have preoperative order for IV antibiotic SSI prophylaxis. (G8918)  

## 2013-07-31 NOTE — Patient Instructions (Addendum)
You have hemorrhoids which were the source of recent bleeding.  I think the constipation is coming from uncontrolled diabetes. It is very important to get your blood sugars controlled for many reasons.  I can treat the hemorrhoids with rubber band ligation - painless way to eliminate them. Avoiding constipation and straing will help also.  If you want to have the hemorrhoids fixed please make an appointment to see me in the office and we can review and discuss further, I have also given you some info to read.  Stay on the MiraLax - let me know if its not working.  Next routine colonoscopy in 10 years 2024  I appreciate the opportunity to care for you. Gatha Mayer, MD, [facility]   Go ahead and make appointment to see Dr. Carlean Purl in office in October or November  YOU HAD AN ENDOSCOPIC PROCEDURE TODAY AT [place]: Refer to the procedure report that was given to you for any specific questions about what was found during the examination.  If the procedure report does not answer your questions, please call your gastroenterologist to clarify.  If you requested that your care partner not be given the details of your procedure findings, then the procedure report has been included in a sealed envelope for you to review at your convenience later.  YOU SHOULD EXPECT: Some feelings of bloating in the abdomen. Passage of more gas than usual.  Walking can help get rid of the air that was put into your GI tract during the procedure and reduce the bloating. If you had a lower endoscopy (such as a colonoscopy or flexible sigmoidoscopy) you may notice spotting of blood in your stool or on the toilet paper. If you underwent a bowel prep for your procedure, then you may not have a normal bowel movement for a few days.  DIET: Your first meal following the procedure should be a light meal and then it is ok to progress to your normal diet.  A half-sandwich or bowl of soup is an example of a good  first meal.  Heavy or fried foods are harder to digest and may make you feel nauseous or bloated.  Likewise meals heavy in dairy and vegetables can cause extra gas to form and this can also increase the bloating.  Drink plenty of fluids but you should avoid alcoholic beverages for 24 hours.  ACTIVITY: Your care partner should take you home directly after the procedure.  You should plan to take it easy, moving slowly for the rest of the day.  You can resume normal activity the day after the procedure however you should NOT DRIVE or use heavy machinery for 24 hours (because of the sedation medicines used during the test).    SYMPTOMS TO REPORT IMMEDIATELY: A gastroenterologist can be reached at any hour.  During normal business hours, 8:30 AM to 5:00 PM Monday through Friday, call [phone].  After hours and on weekends, please call the GI answering service at [phone] who will take a message and have the physician on call contact you.   Following lower endoscopy (colonoscopy or flexible sigmoidoscopy):  Excessive amounts of blood in the stool  Significant tenderness or worsening of abdominal pains  Swelling of the abdomen that is new, acute  Fever of 100F or higher    FOLLOW UP: If any biopsies were taken you will be contacted by phone or by letter within the next 1-3 weeks.  Call your gastroenterologist if you have  not heard about the biopsies in 3 weeks.  Our staff will call the home number listed on your records the next business day following your procedure to check on you and address any questions or concerns that you may have at that time regarding the information given to you following your procedure. This is a courtesy call and so if there is no answer at the home number and we have not heard from you through the emergency physician on call, we will assume that you have returned to your regular daily activities without incident.  SIGNATURES/CONFIDENTIALITY: You and/or your  care partner have signed paperwork which will be entered into your electronic medical record.  These signatures attest to the fact that that the information above on your After Visit Summary has been reviewed and is understood.  Full responsibility of the confidentiality of this discharge information lies with you and/or your care-partner.  Information on hemorrhoids given to you today also

## 2013-07-31 NOTE — Progress Notes (Signed)
Report to pacu rn, vss, bbs=clear 

## 2013-08-03 ENCOUNTER — Telehealth: Payer: Self-pay | Admitting: *Deleted

## 2013-08-03 NOTE — Telephone Encounter (Signed)
  Follow up Call-  Call back number 07/31/2013  Post procedure Call Back phone  # [phone]  Permission to leave phone message Yes     Patient questions:  Do you have a fever, pain , or abdominal swelling? no Pain Score  0 *  Have you tolerated food without any problems? yes  Have you been able to return to your normal activities? yes  Do you have any questions about your discharge instructions: Diet   no Medications  no Follow up visit  no  Do you have questions or concerns about your Care? no  Actions: * If pain score is 4 or above: No action needed, pain <4.

## 2013-10-21 ENCOUNTER — Encounter: Payer: Self-pay | Admitting: Pulmonary Disease

## 2013-10-21 ENCOUNTER — Ambulatory Visit (INDEPENDENT_AMBULATORY_CARE_PROVIDER_SITE_OTHER): Payer: 59 | Admitting: Pulmonary Disease

## 2013-10-21 VITALS — BP 138/88 | HR 62 | Temp 97.7°F | Ht 74.0 in | Wt 315.8 lb

## 2013-10-21 DIAGNOSIS — G4733 Obstructive sleep apnea (adult) (pediatric): Secondary | ICD-10-CM

## 2013-10-21 NOTE — Patient Instructions (Signed)
Will get you a new machine, and set on the automatic mode to see if your dizziness improves.  If it does not, then you will need further evaluation with Dr. Sandi Mariscal. Work on weight loss If you are doing well, followup with me again in one year.  If having issues, please call me so we can sort out.

## 2013-10-21 NOTE — Progress Notes (Signed)
   Subjective:    Patient ID: Brendan [name], male    DOB: [birth year], 57 y.o.   MRN: [id number]  HPI The patient comes in today for followup of his obstructive sleep apnea. I have not seen him in approximately 2 years, but overall he has done fairly well with CPAP. He has an aged CPAP machine and now the display is not working.  He is due for a replacement. The patient feels that he has slept well with the device and has had a good response to therapy. However, most recently he has had issues with some dizziness and headaches, and wants to no if it is related to the pressure from the CPAP machine. I have discussed trying him on the automatic setting which will lower overall the mean CPAP pressure.   Review of Systems  Constitutional: Negative for fever and unexpected weight change.  HENT: Positive for postnasal drip. Negative for congestion, dental problem, ear pain, nosebleeds, rhinorrhea, sinus pressure, sneezing, sore throat and trouble swallowing.   Eyes: Negative for redness and itching.  Respiratory: Positive for cough. Negative for chest tightness, shortness of breath and wheezing.   Cardiovascular: Negative for palpitations and leg swelling.  Gastrointestinal: Positive for nausea. Negative for vomiting.  Genitourinary: Negative for dysuria.  Musculoskeletal: Negative for joint swelling.  Skin: Negative for rash.  Neurological: Positive for dizziness. Negative for headaches.  Hematological: Does not bruise/bleed easily.  Psychiatric/Behavioral: Negative for dysphoric mood. The patient is not nervous/anxious.        Objective:   Physical Exam Obese male in no acute distress Nose without purulence or discharge noted No skin breakdown or pressure necrosis from the CPAP mask Neck without lymphadenopathy or thyromegaly Lower extremities with mild edema, no cyanosis Alert, does not appear to be sleepy, moves all 4 extremities.       Assessment & Plan:

## 2013-10-21 NOTE — Assessment & Plan Note (Signed)
The patient has a history of severe obstructive sleep apnea, and has done fairly well with CPAP overall. He has an age to CPAP machine, and now the display is not functioning. I will give him a new machine, and will also try the automatic mode to see if it makes any difference to his pressure tolerance. He is having some dizziness, and it is unclear if this is even related to his CPAP. If his symptoms continue, I suspect this has nothing to do with his CPAP device, and he will need further workup. I have encouraged him to work aggressively on weight loss, and he is to call me if he has issues with his device. I will see him back in one year for followup.

## 2013-12-20 ENCOUNTER — Other Ambulatory Visit: Payer: Self-pay | Admitting: Pulmonary Disease

## 2014-03-09 ENCOUNTER — Telehealth (HOSPITAL_COMMUNITY): Payer: Self-pay

## 2014-03-09 NOTE — Telephone Encounter (Signed)
This patient is overdue for recommended follow-up with a bariatric surgeon at [facility]. Call attempted today to reestablish post-op care with [facility], but unable to reach patient by phone.  A letter will be mailed to the patient today to the address on file from [facility] advising the patient on the benefits of follow-up care and directing them to call [facility] at [phone] to schedule an appointment at their earliest convenience.  ° °Amanda T. Fleming °Bariatric Office Coordinator °[phone] ° °

## 2014-10-22 ENCOUNTER — Ambulatory Visit (INDEPENDENT_AMBULATORY_CARE_PROVIDER_SITE_OTHER): Payer: 59 | Admitting: Pulmonary Disease

## 2014-10-22 ENCOUNTER — Encounter: Payer: Self-pay | Admitting: Pulmonary Disease

## 2014-10-22 VITALS — BP 134/62 | HR 64 | Temp 98.2°F | Ht 72.0 in | Wt 318.8 lb

## 2014-10-22 DIAGNOSIS — G4733 Obstructive sleep apnea (adult) (pediatric): Secondary | ICD-10-CM

## 2014-10-22 NOTE — Patient Instructions (Signed)
Your download shows great control of your sleep apnea.   Keep up with mask cushion changes and supplies. Work on weight loss followup with me again in one year.

## 2014-10-22 NOTE — Assessment & Plan Note (Signed)
The patient is doing extremely well with his current CPAP setup, and his download shows no significant breakthrough apnea or mask leak. I have asked him to continue on his current settings, and to work aggressively on weight loss. I will see him back in one year if he is doing well.

## 2014-10-22 NOTE — Progress Notes (Signed)
   Subjective:    Patient ID: Brendan [name], male    DOB: 1956-05-28, 58 y.o.   MRN: [id number]  HPI The patient comes in today for follow-up of his obstructive sleep apnea. He is wearing C Pap compliantly by his download, and has no issues with his mask fit or pressure. He has excellent control of his AHI. Of note, his weight is up a few pounds from the last visit a year ago. He feels that he is sleeping well with the device, and has adequate daytime alertness.   Review of Systems  Constitutional: Negative for fever and unexpected weight change.  HENT: Negative for congestion, dental problem, ear pain, nosebleeds, postnasal drip, rhinorrhea, sinus pressure, sneezing, sore throat and trouble swallowing.   Eyes: Negative for redness and itching.  Respiratory: Negative for cough, chest tightness, shortness of breath and wheezing.   Cardiovascular: Negative for palpitations and leg swelling.  Gastrointestinal: Negative for nausea and vomiting.  Genitourinary: Negative for dysuria.  Musculoskeletal: Negative for joint swelling.  Skin: Negative for rash.  Neurological: Negative for headaches.  Hematological: Does not bruise/bleed easily.  Psychiatric/Behavioral: Negative for dysphoric mood. The patient is not nervous/anxious.        Objective:   Physical Exam Obese male in no acute distress Nose without purulence or discharge noted No skin breakdown or pressure necrosis from the C Pap mask Neck without lymphadenopathy or thyromegaly Lower extremities with mild edema, no cyanosis Alert, does not appear to be sleepy, moves all 4 extremities.       Assessment & Plan:

## 2015-03-16 ENCOUNTER — Encounter: Payer: Self-pay | Admitting: Internal Medicine

## 2015-06-10 ENCOUNTER — Other Ambulatory Visit: Payer: Self-pay | Admitting: Family Medicine

## 2015-06-10 DIAGNOSIS — R1310 Dysphagia, unspecified: Secondary | ICD-10-CM

## 2015-06-13 ENCOUNTER — Other Ambulatory Visit: Payer: Self-pay

## 2015-07-07 ENCOUNTER — Other Ambulatory Visit (INDEPENDENT_AMBULATORY_CARE_PROVIDER_SITE_OTHER): Payer: Self-pay | Admitting: Physician Assistant

## 2015-07-07 DIAGNOSIS — R0989 Other specified symptoms and signs involving the circulatory and respiratory systems: Secondary | ICD-10-CM

## 2015-07-07 DIAGNOSIS — R198 Other specified symptoms and signs involving the digestive system and abdomen: Secondary | ICD-10-CM

## 2015-07-07 DIAGNOSIS — Z9884 Bariatric surgery status: Secondary | ICD-10-CM

## 2015-07-15 ENCOUNTER — Other Ambulatory Visit (INDEPENDENT_AMBULATORY_CARE_PROVIDER_SITE_OTHER): Payer: Self-pay | Admitting: Physician Assistant

## 2015-07-15 ENCOUNTER — Ambulatory Visit
Admission: RE | Admit: 2015-07-15 | Discharge: 2015-07-15 | Disposition: A | Discharge status: Home / Self Care | Payer: 59 | Source: Ambulatory Visit | Attending: Physician Assistant | Admitting: Physician Assistant

## 2015-07-15 DIAGNOSIS — Z9884 Bariatric surgery status: Secondary | ICD-10-CM

## 2015-07-15 DIAGNOSIS — R0989 Other specified symptoms and signs involving the circulatory and respiratory systems: Secondary | ICD-10-CM

## 2015-07-15 DIAGNOSIS — R198 Other specified symptoms and signs involving the digestive system and abdomen: Secondary | ICD-10-CM

## 2015-10-28 ENCOUNTER — Encounter: Payer: Self-pay | Admitting: Adult Health

## 2015-10-28 ENCOUNTER — Ambulatory Visit (INDEPENDENT_AMBULATORY_CARE_PROVIDER_SITE_OTHER): Payer: 59 | Admitting: Adult Health

## 2015-10-28 ENCOUNTER — Ambulatory Visit: Payer: 59 | Admitting: Pulmonary Disease

## 2015-10-28 VITALS — BP 138/76 | HR 63 | Temp 98.3°F | Ht 74.0 in | Wt 321.0 lb

## 2015-10-28 DIAGNOSIS — J309 Allergic rhinitis, unspecified: Secondary | ICD-10-CM

## 2015-10-28 DIAGNOSIS — G4733 Obstructive sleep apnea (adult) (pediatric): Secondary | ICD-10-CM | POA: Diagnosis not present

## 2015-10-28 DIAGNOSIS — E66811 Obesity, class 1: Secondary | ICD-10-CM | POA: Insufficient documentation

## 2015-10-28 NOTE — Progress Notes (Signed)
Subjective:    Patient ID: Brendan [name], male    DOB: 01-30-1956, 59 y.o.   MRN: [id number]  HPI  59 year old male with known history of severe sleep apnea followed previously by Dr. Gwenette Greet   TEST  NPSG 2000>AHI 64/h   10/28/2015 follow-up sleep apnea Patient presents for one-year follow-up for sleep apnea. Patient is on nocturnal C Pap at [employer]. Says he uses CPAP 7-8 hours nightly, nasal pillows fits well. Would like to discuss getting new supplies. Feels rested with no sign daytime sleepiness. Says he never misses a night.  Has 2 machines , uses old machine when he travels.  Download shows good compliance on main machine (no chip for old machine. )  AHI 0.2. Avg usage 7 hr . On [facility].   Complains of 1 day of nasal drainage and stuffiness. No fever or discolored mucus  No cough or sob.  No otc used.    Past Medical History  Diagnosis Date  . Diabetes ([place]) 2004  . Hypoglycemic reaction   . Lumbar radiculopathy   . Atopic dermatitis   . Peripheral neuropathy ([place])   . Prostate cancer ([place]) 2006  . Venous insufficiency 2005  . Hypothyroidism 2004  . Sleep apnea, obstructive 2000    uses a cpap  . Hypertension   . Intermittent vertigo 2012  . Hyperlipidemia   . Incontinence   . Erectile dysfunction   . Pollen allergies 2007    perennial  . Sickle cell trait ([place]) 2006  . Reflux esophagitis 1995  . Hyperaldosteronism ([place]) 2000  . Fatty liver 2007  . Glaucoma 2008  . Gynecomastia 2009  . Left cervical radiculopathy 2012  . Vitamin D deficiency 2012  . Hypercholesterolemia   . Acanthosis nigricans   . Obesity    Current Outpatient Prescriptions on File Prior to Visit  Medication Sig Dispense Refill  . AMILORIDE-HYDROCHLOROTHIAZIDE PO Take by mouth 2 (two) times daily. 5 mg/50 mg tablet 1 bid    . Blood Glucose Monitoring Suppl (ONE TOUCH ULTRA SYSTEM KIT) W/DEVICE KIT 1 kit by Does not apply route once. One touch ultrasoft lancets    . diltiazem (CARDIZEM  CD) 240 MG 24 hr capsule Take 240 mg by mouth daily.     Marland Kitchen glucose blood test strip 1 each by Other route as needed for other. Use as instructed one touch test strips    . Insulin Lispro Prot & Lispro (HUMALOG MIX 75/25 [place]) Inject into the skin. humalog mix 75/25 injection 60-100 units q am sliding scale. Humalog mix 50/50 pen system, disposable 30-40 units sliding scale at lunch and h.s.    . Lancets (ONETOUCH ULTRASOFT) lancets     . Levothyroxine Sodium (LEVOXYL PO) Take by mouth. 0.125 mg tablet 1 daily    . losartan (COZAAR) 100 MG tablet Take 100 mg by mouth daily.    . polyethylene glycol (MIRALAX / GLYCOLAX) packet Take 17 g by mouth daily.    . potassium chloride (K-DUR) 10 MEQ tablet Take 10 mEq by mouth 2 (two) times daily.    . simvastatin (ZOCOR) 20 MG tablet Take 20 mg by mouth every evening.    . Multiple Vitamin (MULTIVITAMINS PO) Take by mouth.    . travoprost, benzalkonium, (TRAVATAN) 0.004 % ophthalmic solution 1 drop at bedtime.    . Vitamin D, Ergocalciferol, (DRISDOL) 50000 UNITS CAPS capsule Take 50,000 Units by mouth.     No current facility-administered medications on file prior to visit.  Review of Systems Constitutional:   No  weight loss, night sweats,  Fevers, chills, fatigue, or  lassitude.  HEENT:   No headaches,  Difficulty swallowing,  Tooth/dental problems, or  Sore throat,                No sneezing, itching, ear ache, + nasal congestion, post nasal drip,   CV:  No chest pain,  Orthopnea, PND, swelling in lower extremities, anasarca, dizziness, palpitations, syncope.   GI  No heartburn, indigestion, abdominal pain, nausea, vomiting, diarrhea, change in bowel habits, loss of appetite, bloody stools.   Resp: No shortness of breath with exertion or at rest.  No excess mucus, no productive cough,  No non-productive cough,  No coughing up of blood.  No change in color of mucus.  No wheezing.  No chest wall deformity  Skin: no rash or lesions.  GU: no  dysuria, change in color of urine, no urgency or frequency.  No flank pain, no hematuria   MS:  No joint pain or swelling.  No decreased range of motion.  No back pain.  Psych:  No change in mood or affect. No depression or anxiety.  No memory loss.         Objective:   Physical Exam GEN: A/Ox3; pleasant , NAD, obese   HEENT:  [place]/AT,  EACs-clear, TMs-wnl, NOSE-clear, THROAT-clear, no lesions, no postnasal drip or exudate noted. Class 3 MP airway   NECK:  Supple w/ fair ROM; no JVD; normal carotid impulses w/o bruits; no thyromegaly or nodules palpated; no lymphadenopathy.  RESP  Clear  P & A; w/o, wheezes/ rales/ or rhonchi.no accessory muscle use, no dullness to percussion  CARD:  RRR, no m/r/g  , no peripheral edema, pulses intact, no cyanosis or clubbing.  GI:   Soft & nt; nml bowel sounds; no organomegaly or masses detected.  Musco: Warm bil, no deformities or joint swelling noted.   Neuro: alert, no focal deficits noted.    Skin: Warm, no lesions or rashes         Assessment & Plan:

## 2015-10-28 NOTE — Assessment & Plan Note (Signed)
Well controlled on CPAP   Plan  Continue on CPAP At bedtime   Do not drive if sleepy  Work on weight loss.  May use Zyrtec 10mg  At bedtime  As needed  Drainage  Saline nasal rinses As needed   Follow up Dr. Halford Chessman  In 1 year and As needed   Please contact office for sooner follow up if symptoms do not improve or worsen or seek emergency care

## 2015-10-28 NOTE — Assessment & Plan Note (Signed)
AR /URI   Plan  May use Zyrtec 10mg  At bedtime  As needed  Drainage  Saline nasal rinses As needed   Follow up Dr. Halford Chessman  In 1 year and As needed   Please contact office for sooner follow up if symptoms do not improve or worsen or seek emergency care

## 2015-10-28 NOTE — Progress Notes (Signed)
Reviewed and agree with assessment/plan. 

## 2015-10-28 NOTE — Assessment & Plan Note (Signed)
Work no wt loss

## 2015-10-28 NOTE — Addendum Note (Signed)
Addended by: Osa Craver on: 10/28/2015 09:44 AM   Modules accepted: Orders

## 2015-10-28 NOTE — Patient Instructions (Signed)
Continue on CPAP At bedtime   Do not drive if sleepy  Work on weight loss.  May use Zyrtec 10mg  At bedtime  As needed  Drainage  Saline nasal rinses As needed   Follow up Dr. Halford Chessman  In 1 year and As needed   Please contact office for sooner follow up if symptoms do not improve or worsen or seek emergency care

## 2015-11-02 ENCOUNTER — Encounter: Payer: Self-pay | Admitting: Pulmonary Disease

## 2016-05-10 ENCOUNTER — Ambulatory Visit: Payer: 59 | Attending: Otolaryngology

## 2016-05-10 DIAGNOSIS — H8113 Benign paroxysmal vertigo, bilateral: Secondary | ICD-10-CM | POA: Diagnosis present

## 2016-05-10 DIAGNOSIS — R2689 Other abnormalities of gait and mobility: Secondary | ICD-10-CM | POA: Insufficient documentation

## 2016-05-10 NOTE — Therapy (Signed)
[facility] [address] [place], [place], [address] Phone: [phone]   Fax:  [phone]  Physical Therapy Evaluation  Patient Details  Name: Brendan [name] MRN: [id number] Date of Birth: 10-24-1956 Referring Provider: Dr. Constance Holster  Encounter Date: 05/10/2016      PT End of Session - 05/10/16 1253    Visit Number 1   Number of Visits 5   Date for PT Re-Evaluation 06/09/16   Authorization Type UHC Medicare: G-CODE every 10th visit and progress note.   PT Start Time 0802   PT Stop Time 0848   PT Time Calculation (min) 46 min   Activity Tolerance Patient tolerated treatment well   Behavior During Therapy [facility] for tasks assessed/performed      Past Medical History  Diagnosis Date  . Diabetes ([place]) 2004  . Hypoglycemic reaction   . Lumbar radiculopathy   . Atopic dermatitis   . Peripheral neuropathy ([place])   . Prostate cancer ([place]) 2006  . Venous insufficiency 2005  . Hypothyroidism 2004  . Sleep apnea, obstructive 2000    uses a cpap  . Hypertension   . Intermittent vertigo 2012  . Hyperlipidemia   . Incontinence   . Erectile dysfunction   . Pollen allergies 2007    perennial  . Sickle cell trait ([place]) 2006  . Reflux esophagitis 1995  . Hyperaldosteronism ([place]) 2000  . Fatty liver 2007  . Glaucoma 2008  . Gynecomastia 2009  . Left cervical radiculopathy 2012  . Vitamin D deficiency 2012  . Hypercholesterolemia   . Acanthosis nigricans   . Obesity     Past Surgical History  Procedure Laterality Date  . Colonoscopy  2006    normal  . Lap band surgery  2009  . Left inguinal hernia repair  1998  . Robotic prostatectomy  2008    There were no vitals filed for this visit.       Subjective Assessment - 05/10/16 0807    Subjective Pt reported dizziness began about 2-3 years ago. Pt reports he can't lay on his back without getting dizzy or getting sick. He went to the dentist about 6 months ago and  was unable to lie back due to getting sick. Pt describes dizziness as a spinning sensation, "worse than being drunk" and pt believes it is getting worse.  Pt reports the spinning sensation lasts about one minute and then he feels woozy. Lying down makes it worse and sitting up makes it better. Pt denied falls in the last 6 months.    Pertinent History DM, peripheral neuropathy, HTN, glaucoma, hx prostate CA, OSA, hypothyroidism, obesity, hyperlipidemia   Patient Stated Goals I want to be able to lie on my back so I can work on my car   Currently in Pain? No/denies            [facility] PT Assessment - 05/10/16 0813    Assessment   Medical Diagnosis Vertigo   Referring Provider Dr. Constance Holster   Onset Date/Surgical Date 05/10/14   Hand Dominance Right   Prior Therapy none   Precautions   Precautions None   Restrictions   Weight Bearing Restrictions No   Balance Screen   Has the patient fallen in the past 6 months No   Has the patient had a decrease in activity level because of a fear of falling?  No   Is the patient reluctant to leave their home because of a fear of falling?  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   Ambulation/Gait   Ambulation/Gait Yes    Ambulation/Gait Assistance 7: Independent   Ambulation Distance (Feet) 100 Feet   Assistive device None   Gait Pattern Within Functional Limits   Ambulation Surface Level;Indoor   Gait velocity 3.56ft/sec.            Vestibular Assessment - 05/10/16 0817    Symptom Behavior   Type of Dizziness Spinning   Frequency of Dizziness every day if he lies down   Duration of Dizziness Less than a minute and then woozy/nauseated   Aggravating Factors Lying supine   Relieving Factors Comments  sitting upright   Occulomotor Exam   Occulomotor Alignment Normal   Spontaneous Absent   Gaze-induced Absent   Smooth Pursuits Intact   Saccades Intact   Comment No dizziness reported during saccades or smooth pursuits   Vestibulo-Occular Reflex   VOR 1 Head Only (x 1 viewing) WFL and no reported dizziness.   Positional Testing   Dix-Hallpike Dix-Hallpike Right;Dix-Hallpike Left   Horizontal Canal Testing Horizontal Canal Right;Horizontal Canal Left   Dix-Hallpike Right   Dix-Hallpike Right Duration 8/10 after approx. 30 sec. Nystagmus delayed   Dix-Hallpike Right Symptoms Upbeat, right rotatory nystagmus  slow beats, delayed onset   Dix-Hallpike Left   Dix-Hallpike Left Duration slight dizziness but no spinning   Dix-Hallpike Left Symptoms No nystagmus   Horizontal Canal Right   Horizontal Canal Right Duration none   Horizontal Canal Right Symptoms Normal   Horizontal Canal Left   Horizontal Canal Left Duration none   Horizontal Canal Left Symptoms Normal                Vestibular Treatment/Exercise - 05/10/16 [id number]    Vestibular Treatment/Exercise   Vestibular Treatment Provided Canalith Repositioning   Canalith Repositioning Epley Manuever Right    EPLEY MANUEVER RIGHT   Number of Reps  2   Overall Response Symptoms Worsened   Response Details  Pt reported dizziness incr. to 9/10 after second Epleys treatment. However, amplitude of nystagmus decr.                PT Education - 05/10/16 1253    Education provided Yes   Education Details PT educated pt on frequency/duration and BPPV.   Person(s) Educated Patient   Methods Explanation;Demonstration   Comprehension Verbalized understanding          PT Short Term Goals - 05/10/16 1300    PT SHORT TERM GOAL #1   Title same as LTGs           PT Long Term Goals - 05/10/16 1300    PT LONG TERM GOAL #1   Title Pt will  report 0/10 dizziness and negative nystagmus during B positional testing to improve functional mobility. Target date: 06/07/16   Status New   PT LONG TERM GOAL #2   Title Pt will be IND in HEP to improve dizziness and balance. Target date: 06/07/16   Status New   PT LONG TERM GOAL #3   Title Perform FGA and write goal in appropriate. Target date: 06/07/16   Status New               Plan - 05/10/16 1254    Clinical Impression Statement Pt is a pleasant 60y/o male presenting to OPPT neuro with vertigo. During vestibular assessment pt dizziness and nystagmus consistent with R pBPPV (R upbeating rotatory nystagmus and dizziness) and potential L pBPPV as pt reported slight dizziness during L Dix-Hallpike but no nystagmus. PT performed R Epleys treatment, however, pt reported dizziness sx's incr., despite nystagmus amplitude decr. This could be due to pt's hx of vertigo. Pt negative for horizontal canal BPPV. PT will formally assess balance next session prn, pt's gait WNL during exam.  Pt also mentioned during exam that he occasionally forgets where things are located when he is driving, which he finds odd as he is a retired Leisure centre manager with surroundings, PT encouraged pt to inform MD.  [place] goal not written, as his 10% score indicates dizziness has a mild impact on quality of life.   Rehab Potential Good   Clinical Impairments Affecting Rehab Potential hx of vertigo, co-morbidities   PT Frequency 1x / week   PT Duration 4 weeks   PT Treatment/Interventions ADLs/Self  Care Home Management;Biofeedback;Canalith Repostioning;Patient/family education;Neuromuscular re-education;Balance training;Therapeutic exercise;Therapeutic activities;Gait training;Manual techniques;Functional mobility training;Vestibular   PT Next Visit Plan Reassess for B pBPPV and treat prn, perform FGA and provide HEP for balance prn.   Consulted and Agree with Plan of Care Patient      Patient will benefit from skilled therapeutic intervention in order to improve the following deficits and impairments:  Decreased balance, Dizziness, Decreased activity tolerance  Visit Diagnosis: BPPV (benign paroxysmal positional vertigo), bilateral - Plan: PT plan of care cert/re-cert  Other abnormalities of gait and mobility - Plan: PT plan of care cert/re-cert      G-Codes - [id number] 1302    Functional Assessment Tool Used 8-9/10 dizzinesss in supine, DHI: 10%   Functional Limitation Changing and maintaining body position   Changing and Maintaining Body Position Current Status [id number]) At least 80 percent but less than 100 percent impaired, limited or restricted   Changing and Maintaining Body Position Goal Status [id number]) At least 1 percent but less than 20 percent impaired, limited or restricted       Problem List Patient Active Problem List   Diagnosis Date Noted  . Morbid obesity ([place]) 10/28/2015  . Internal hemorrhoids with bleeding 07/31/2013  . DM 01/11/2010  . Obstructive sleep apnea 01/11/2010  . HYPERTENSION 01/11/2010  . GLAUCOMA 01/10/2010  . Allergic rhinitis 01/10/2010  . PROSTATE CANCER, HX OF 01/10/2010    Miller,Jennifer L 05/10/2016, 1:04 PM  [facility] [address] [place], [place], [address] Phone: [phone]   Fax:  [phone]  Name: Brendan [name] MRN: [id number] Date of Birth: 14-Aug-1956   Geoffry Paradise, PT,DPT 05/10/2016 1:04 PM Phone: [phone] Fax: [phone]

## 2016-05-24 ENCOUNTER — Ambulatory Visit: Payer: 59 | Attending: Otolaryngology

## 2016-05-24 DIAGNOSIS — R2689 Other abnormalities of gait and mobility: Secondary | ICD-10-CM | POA: Diagnosis present

## 2016-05-24 DIAGNOSIS — H8113 Benign paroxysmal vertigo, bilateral: Secondary | ICD-10-CM

## 2016-05-24 NOTE — Patient Instructions (Signed)
Sit to Supine (Active)    From sitting position, lie on side, top leg bent. Roll slowly to back. Complete __1_ sets of _3-5__ repetitions. Perform _2__ sessions per day.  Copyright  VHI. All rights reserved.   Perform at counter with hand support as needed.  Up / Down Head Motion    Perform without assistive device. Walking on solid surface, move head and eyes toward ceiling for __2__ steps.  Then, move head and eyes toward floor for _2___ steps. Repeat __4__ times per session. Do __1__ sessions per day.   Copyright  VHI. All rights reserved.  Side to Side Head Motion    Perform without assistive device. Walking on solid surface, turn head and eyes to left for __2__ steps.  Then, turn head and eyes to opposite side for __2__ steps. Repeat sequence __4__ times per session. Do __1__ sessions per day.   Copyright  VHI. All rights reserved.  Walking    Walk on solid surface with hand close to support with eyes closed (10 feet). Attempt to keep hand away from support for longer periods of time, keeping a straight path. Repeat __4__ times per session. Do __1__ sessions per day.  Copyright  VHI. All rights reserved.  Feet Heel-Toe "Tandem"    Arms outstretched, walk a straight line bringing one foot directly in front of the other, hold counter as needed. Repeat for __10__ steps per session, repeat 4 times. Do __1__ sessions per day.  Copyright  VHI. All rights reserved.

## 2016-05-24 NOTE — Therapy (Signed)
[facility] [address] [place], [place], [address] Phone: [phone]   Fax:  [phone]  Physical Therapy Treatment  Patient Details  Name: Brendan [name] MRN: [id number] Date of Birth: 11/02/1956 Referring Provider: Dr. Constance Holster  Encounter Date: 05/24/2016      PT End of Session - 05/24/16 1037    Visit Number 2   Number of Visits 5   Date for PT Re-Evaluation 06/09/16   Authorization Type UHC Medicare: G-CODE every 10th visit and progress note.   PT Start Time [phone]   PT Stop Time 0927   PT Time Calculation (min) 38 min   Equipment Utilized During Treatment Gait belt   Activity Tolerance Patient tolerated treatment well   Behavior During Therapy WFL for tasks assessed/performed      Past Medical History  Diagnosis Date  . Diabetes ([place]) 2004  . Hypoglycemic reaction   . Lumbar radiculopathy   . Atopic dermatitis   . Peripheral neuropathy ([place])   . Prostate cancer ([place]) 2006  . Venous insufficiency 2005  . Hypothyroidism 2004  . Sleep apnea, obstructive 2000    uses a cpap  . Hypertension   . Intermittent vertigo 2012  . Hyperlipidemia   . Incontinence   . Erectile dysfunction   . Pollen allergies 2007    perennial  . Sickle cell trait ([place]) 2006  . Reflux esophagitis 1995  . Hyperaldosteronism ([place]) 2000  . Fatty liver 2007  . Glaucoma 2008  . Gynecomastia 2009  . Left cervical radiculopathy 2012  . Vitamin D deficiency 2012  . Hypercholesterolemia   . Acanthosis nigricans   . Obesity     Past Surgical History  Procedure Laterality Date  . Colonoscopy  2006    normal  . Lap band surgery  2009  . Left inguinal hernia repair  1998  . Robotic prostatectomy  2008    There were no vitals filed for this visit.      Subjective Assessment - 05/24/16 0851    Subjective Pt reported he tried to lie on his back last night and he feels that dizziness was better. Pt reported he was woozy after last  session but feels better overall.    Pertinent History DM, peripheral neuropathy, HTN, glaucoma, hx prostate CA, OSA, hypothyroidism, obesity, hyperlipidemia   Patient Stated Goals I want to be able to lie on my back so I can work on my car   Currently in Pain? No/denies            [facility] PT Assessment - 05/24/16 0858    Functional Gait  Assessment   Gait assessed  Yes   Gait Level Surface Walks 20 ft in less than 7 sec but greater than 5.5 sec, uses assistive device, slower speed, mild gait deviations, or deviates 6-10 in outside of the 12 in walkway width.   Change in Gait Speed Able to change speed, demonstrates mild gait deviations, deviates 6-10 in outside of the 12 in walkway width, or no gait deviations, unable to achieve a major change in velocity, or uses a change in velocity, or uses an assistive device.   Gait with Horizontal Head Turns Performs head turns smoothly with slight change in gait velocity (eg, minor disruption to smooth gait path), deviates 6-10 in outside 12 in walkway width, or uses an assistive device.   Gait with Vertical Head Turns Performs task with slight change in gait velocity (eg, minor disruption to smooth gait path), deviates  6 - 10 in outside 12 in walkway width or uses assistive device   Gait and Pivot Turn Pivot turns safely within 3 sec and stops quickly with no loss of balance.   Step Over Obstacle Is able to step over 2 stacked shoe boxes taped together (9 in total height) without changing gait speed. No evidence of imbalance.   Gait with Narrow Base of Support Ambulates 4-7 steps.   Gait with Eyes Closed Walks 20 ft, slow speed, abnormal gait pattern, evidence for imbalance, deviates 10-15 in outside 12 in walkway width. Requires more than 9 sec to ambulate 20 ft.   Ambulating Backwards Walks 20 ft, slow speed, abnormal gait pattern, evidence for imbalance, deviates 10-15 in outside 12 in walkway width.   Steps Alternating feet, no rail.   Total Score  20            Vestibular Assessment - 05/24/16 0902    Dix-Hallpike Right   Dix-Hallpike Right Duration 0/10 dizziness   Dix-Hallpike Right Symptoms No nystagmus   Dix-Hallpike Left   Dix-Hallpike Left Duration 3/10 wooziness. Pt reported "not as bad as last time." No spinning.   Dix-Hallpike Left Symptoms No nystagmus     Neuro re-ed: Pt performed habituation (supine<>sit) and balance HEP (in // bars) with cues and demonstration for technique. Pt noted to experience incr. Postural sway during exercises that require incr. Input from vestibular system (head turns/nods and eyes closed). Pt performed each activity 4x7'. Please see pt instructions for details.             [facility] Adult PT Treatment/Exercise - 05/24/16 0930    Ambulation/Gait   Ambulation/Gait Yes   Ambulation/Gait Assistance 5: Supervision   Ambulation/Gait Assistance Details S for safety. No overt LOB, incr. sway with head turns/nods.   Ambulation Distance (Feet) 500 Feet   Assistive device None   Gait Pattern Step-to pattern;Decreased stride length   Ambulation Surface Level;Unlevel;Indoor;Outdoor;Paved;Other (comment);Gravel  mulch         Vestibular Treatment/Exercise - 05/24/16 0930    Vestibular Treatment/Exercise   Vestibular Treatment Provided Habituation   Habituation Exercises Comment  supine<>sit x2 reps, see pt instructions for details.               PT Education - 05/24/16 1035    Education provided Yes   Education Details PT discussed the likelihood of BPPV returning and provided pt with habituation and balance HEP. PT also discussed d/c early based on progress. PT discussed FGA results.    Person(s) Educated Patient   Methods Explanation;Demonstration;Verbal cues;Handout   Comprehension Returned demonstration;Verbalized understanding          PT Short Term Goals - 05/10/16 1300    PT SHORT TERM GOAL #1   Title same as LTGs           PT Long Term Goals - 05/24/16  1040    PT LONG TERM GOAL #1   Title Pt will report 0/10 dizziness and negative nystagmus during B positional testing to improve functional mobility. Target date: 06/07/16   Status On-going   PT LONG TERM GOAL #2   Title Pt will be IND in HEP to improve dizziness and balance. Target date: 06/07/16   Status On-going   PT LONG TERM GOAL #3   Title Perform FGA and write goal in appropriate. Target date: 06/07/16   Status Achieved   PT LONG TERM GOAL #4   Title Pt will incr. FGA score to >/=28/30 to decr.  falls risk. Target date: 06/07/16   Status New               Plan - 05/24/16 1037    Clinical Impression Statement Pt demonstrated progress, as B Dix-Hallpike negative for nystagmus and spinning sensation. Pt did report wooziness during L Dix-Hallpike, which PT provided pt with habituation HEP to decr. wooziness. Pt's FGA score of 20/30 indicates pt is at a medium risk for falls. PT will assess for vertigo next session and likely d/c if pt continues to demonstrate progress.    Rehab Potential Good   Clinical Impairments Affecting Rehab Potential hx of vertigo, co-morbidities   PT Frequency 1x / week   PT Duration 4 weeks   PT Treatment/Interventions ADLs/Self Care Home Management;Biofeedback;Canalith Repostioning;Patient/family education;Neuromuscular re-education;Balance training;Therapeutic exercise;Therapeutic activities;Gait training;Manual techniques;Functional mobility training;Vestibular   PT Next Visit Plan Reassess for B pBPPV and treat prn, review HEP, d/c if appropriate. Check goals and G-CODE.   PT Home Exercise Plan Habituation and balance HEP.   Consulted and Agree with Plan of Care Patient      Patient will benefit from skilled therapeutic intervention in order to improve the following deficits and impairments:  Decreased balance, Dizziness, Decreased activity tolerance  Visit Diagnosis: BPPV (benign paroxysmal positional vertigo), bilateral  Other abnormalities of  gait and mobility     Problem List Patient Active Problem List   Diagnosis Date Noted  . Morbid obesity ([place]) 10/28/2015  . Internal hemorrhoids with bleeding 07/31/2013  . DM 01/11/2010  . Obstructive sleep apnea 01/11/2010  . HYPERTENSION 01/11/2010  . GLAUCOMA 01/10/2010  . Allergic rhinitis 01/10/2010  . PROSTATE CANCER, HX OF 01/10/2010    Mykeisha Dysert L 05/24/2016, 10:41 AM  [facility] [address] [place], [place], [address] Phone: [phone]   Fax:  [phone]  Name: Brendan [name] MRN: [id number] Date of Birth: 05-15-1956    Geoffry Paradise, PT,DPT 05/24/2016 10:41 AM Phone: [phone] Fax: [phone]

## 2016-06-14 ENCOUNTER — Ambulatory Visit: Payer: 59

## 2016-07-19 NOTE — Therapy (Signed)
[facility] [address] [place], [place], [address] Phone: [phone]   Fax:  [phone]  Patient Details  Name: Brendan [name] MRN: [id number] Date of Birth: 04/17/1956 Referring Provider:  No ref. provider found  Encounter Date: 07/19/2016  PHYSICAL THERAPY DISCHARGE SUMMARY  Visits from Start of Care: 2  Current functional level related to goals / functional outcomes:     PT Long Term Goals - 05/24/16 1040      PT LONG TERM GOAL #1   Title Pt will report 0/10 dizziness and negative nystagmus during B positional testing to improve functional mobility. Target date: 06/07/16   Status On-going     PT LONG TERM GOAL #2   Title Pt will be IND in HEP to improve dizziness and balance. Target date: 06/07/16   Status On-going     PT LONG TERM GOAL #3   Title Perform FGA and write goal in appropriate. Target date: 06/07/16   Status Achieved     PT LONG TERM GOAL #4   Title Pt will incr. FGA score to >/=28/30 to decr. falls risk. Target date: 06/07/16   Status New        Remaining deficits: Pt did not return since last visit, stated he felt better and was going out of town.   Education / Equipment: HEP  Plan: Patient agrees to discharge.  Patient goals were not met. Patient is being discharged due to not returning since the last visit.  ?????       Miller,Jennifer L 07/19/2016, 12:00 PM   [facility] [address] [place], [place], [address] Phone: [phone]   Fax:  [phone]   Jennifer Miller, PT,DPT 07/19/16 12:00 PM Phone: [phone] Fax: [phone]   

## 2016-09-19 ENCOUNTER — Other Ambulatory Visit (HOSPITAL_COMMUNITY): Payer: Self-pay | Admitting: Internal Medicine

## 2016-09-19 ENCOUNTER — Other Ambulatory Visit: Payer: Self-pay | Admitting: Internal Medicine

## 2016-09-19 DIAGNOSIS — R600 Localized edema: Secondary | ICD-10-CM

## 2016-09-19 DIAGNOSIS — Z952 Presence of prosthetic heart valve: Secondary | ICD-10-CM

## 2016-09-26 ENCOUNTER — Other Ambulatory Visit (HOSPITAL_COMMUNITY): Payer: 59

## 2016-09-27 ENCOUNTER — Other Ambulatory Visit: Payer: Self-pay

## 2016-09-27 ENCOUNTER — Ambulatory Visit (HOSPITAL_COMMUNITY): Payer: 59 | Attending: Internal Medicine

## 2016-09-27 DIAGNOSIS — R600 Localized edema: Secondary | ICD-10-CM | POA: Diagnosis present

## 2016-10-26 ENCOUNTER — Ambulatory Visit: Payer: 59 | Admitting: Pulmonary Disease

## 2016-10-29 ENCOUNTER — Encounter: Payer: Self-pay | Admitting: Adult Health

## 2016-10-29 ENCOUNTER — Ambulatory Visit (INDEPENDENT_AMBULATORY_CARE_PROVIDER_SITE_OTHER): Payer: 59 | Admitting: Adult Health

## 2016-10-29 DIAGNOSIS — G4733 Obstructive sleep apnea (adult) (pediatric): Secondary | ICD-10-CM

## 2016-10-29 NOTE — Assessment & Plan Note (Signed)
Well controlled on CPAP w/ excellent compliance   Plan  Patient Instructions  Continue on CPAP At bedtime   Do not drive if sleepy  Work on weight loss.  Saline nasal rinses As needed   Follow up Dr. Halford Chessman  In 1 year and As needed   Please contact office for sooner follow up if symptoms do not improve or worsen or seek emergency care

## 2016-10-29 NOTE — Progress Notes (Signed)
Reviewed and agree with assessment/plan.  Chesley Mires, MD [facility] Pulmonary/Critical Care 10/29/2016, 4:30 PM Pager:  [phone]

## 2016-10-29 NOTE — Patient Instructions (Signed)
Continue on CPAP At bedtime   Do not drive if sleepy  Work on weight loss.  Saline nasal rinses As needed   Follow up Dr. Halford Chessman  In 1 year and As needed   Please contact office for sooner follow up if symptoms do not improve or worsen or seek emergency care

## 2016-10-29 NOTE — Progress Notes (Signed)
Subjective:    Patient ID: Brendan [name], male    DOB: June 06, 1956, 60 y.o.   MRN: [id number]  HPI  60 year old male with known history of severe sleep apnea followed previously by Dr. Gwenette Greet   TEST  NPSG 2000>AHI 64/h   10/29/2016 Follow-up sleep apnea Patient presents for one-year follow-up for severe sleep apnea. Patient is on nocturnal C Pap at [facility]. Says he uses CPAP 7 hours nightly, nasal pillows fits well.   Feels rested with no sign daytime sleepiness.   Download shows good compliance on main machine  AHI 0.5 Avg usage  6.5 hr . On [facility]. 5 to 20cmH20.   Had a cold last week, congestion is now resolving . No fever, discolored mucus . Appetite No chest pain , orthopnea or hemoptysis .   Past Medical History:  Diagnosis Date  . Acanthosis nigricans   . Atopic dermatitis   . Diabetes ([place]) 2004  . Erectile dysfunction   . Fatty liver 2007  . Glaucoma 2008  . Gynecomastia 2009  . Hyperaldosteronism ([place]) 2000  . Hypercholesterolemia   . Hyperlipidemia   . Hypertension   . Hypoglycemic reaction   . Hypothyroidism 2004  . Incontinence   . Intermittent vertigo 2012  . Left cervical radiculopathy 2012  . Lumbar radiculopathy   . Obesity   . Peripheral neuropathy ([place])   . Pollen allergies 2007   perennial  . Prostate cancer ([place]) 2006  . Reflux esophagitis 1995  . Sickle cell trait ([place]) 2006  . Sleep apnea, obstructive 2000   uses a cpap  . Venous insufficiency 2005  . Vitamin D deficiency 2012   Current Outpatient Prescriptions on File Prior to Visit  Medication Sig Dispense Refill  . AMILORIDE-HYDROCHLOROTHIAZIDE PO Take by mouth 2 (two) times daily. 5 mg/50 mg tablet 1 bid    . aspirin 81 MG tablet Take 81 mg by mouth daily.    . Blood Glucose Monitoring Suppl (ONE TOUCH ULTRA SYSTEM KIT) W/DEVICE KIT 1 kit by Does not apply route once. One touch ultrasoft lancets    . diltiazem (CARDIZEM CD) 240 MG 24 hr capsule Take 240 mg by mouth daily.     Marland Kitchen  glucose blood test strip 1 each by Other route as needed for other. Use as instructed one touch test strips    . Insulin Lispro Prot & Lispro (HUMALOG MIX 75/25 [place]) Inject into the skin. humalog mix 75/25 injection 60-100 units q am sliding scale. Humalog mix 50/50 pen system, disposable 30-40 units sliding scale at lunch and h.s.    . Lancets (ONETOUCH ULTRASOFT) lancets     . Levothyroxine Sodium (LEVOXYL PO) Take by mouth. 0.125 mg tablet 1 daily    . losartan (COZAAR) 100 MG tablet Take 100 mg by mouth daily.    . potassium chloride (K-DUR) 10 MEQ tablet Take 20 mEq by mouth 2 (two) times daily.     . simvastatin (ZOCOR) 20 MG tablet Take 20 mg by mouth every evening.    . Multiple Vitamin (MULTIVITAMINS PO) Take by mouth. Reported on 05/10/2016    . polyethylene glycol (MIRALAX / GLYCOLAX) packet Take 17 g by mouth daily. Reported on 05/10/2016    . Vitamin D, Ergocalciferol, (DRISDOL) 50000 UNITS CAPS capsule Take 50,000 Units by mouth. Reported on 05/10/2016     No current facility-administered medications on file prior to visit.      Review of Systems Constitutional:   No  weight loss, night sweats,  Fevers, chills, fatigue, or  lassitude.  HEENT:   No headaches,  Difficulty swallowing,  Tooth/dental problems, or  Sore throat,                No sneezing, itching, ear ache, + nasal congestion, post nasal drip,   CV:  No chest pain,  Orthopnea, PND, swelling in lower extremities, anasarca, dizziness, palpitations, syncope.   GI  No heartburn, indigestion, abdominal pain, nausea, vomiting, diarrhea, change in bowel habits, loss of appetite, bloody stools.   Resp: No shortness of breath with exertion or at rest.  No excess mucus, no productive cough,  No non-productive cough,  No coughing up of blood.  No change in color of mucus.  No wheezing.  No chest wall deformity  Skin: no rash or lesions.  GU: no dysuria, change in color of urine, no urgency or frequency.  No flank pain, no  hematuria   MS:  No joint pain or swelling.  No decreased range of motion.  No back pain.  Psych:  No change in mood or affect. No depression or anxiety.  No memory loss.         Objective:   Physical Exam  Vitals:   10/29/16 0911  BP: 138/80  Pulse: 63  Temp: 98 F (36.7 C)  TempSrc: Oral  SpO2: 97%  Weight: (!) 326 lb 9.6 oz (148.1 kg)  Height: 6' 2"  (1.88 m)    GEN: A/Ox3; pleasant , NAD, obese    HEENT:  [place]/AT,  EACs-clear, TMs-wnl, NOSE-clear, THROAT-clear, no lesions, no postnasal drip or exudate noted. Class 3 MP airway   NECK:  Supple w/ fair ROM; no JVD; normal carotid impulses w/o bruits; no thyromegaly or nodules palpated; no lymphadenopathy.    RESP  Clear  P & A; w/o, wheezes/ rales/ or rhonchi. no accessory muscle use, no dullness to percussion  CARD:  RRR, no m/r/g  , no peripheral edema, pulses intact, no cyanosis or clubbing.  GI:   Soft & nt; nml bowel sounds; no organomegaly or masses detected.   Musco: Warm bil, no deformities or joint swelling noted.   Neuro: alert, no focal deficits noted.    Skin: Warm, no lesions or rashes    Aerin Delany NP-C  [facility]  [phone]   10/29/2016

## 2016-11-01 ENCOUNTER — Encounter: Payer: Self-pay | Admitting: Adult Health

## 2016-12-26 ENCOUNTER — Encounter (HOSPITAL_COMMUNITY): Payer: Self-pay | Admitting: Emergency Medicine

## 2016-12-26 ENCOUNTER — Ambulatory Visit (HOSPITAL_COMMUNITY)
Admission: EM | Admit: 2016-12-26 | Discharge: 2016-12-26 | Disposition: A | Discharge status: Home / Self Care | Payer: 59 | Attending: Family Medicine | Admitting: Family Medicine

## 2016-12-26 ENCOUNTER — Ambulatory Visit (INDEPENDENT_AMBULATORY_CARE_PROVIDER_SITE_OTHER): Payer: 59

## 2016-12-26 DIAGNOSIS — J40 Bronchitis, not specified as acute or chronic: Secondary | ICD-10-CM

## 2016-12-26 DIAGNOSIS — R0602 Shortness of breath: Secondary | ICD-10-CM | POA: Diagnosis not present

## 2016-12-26 DIAGNOSIS — R05 Cough: Secondary | ICD-10-CM | POA: Diagnosis not present

## 2016-12-26 MED ORDER — ALBUTEROL SULFATE HFA 108 (90 BASE) MCG/ACT IN AERS: 2.0000 | INHALATION_SPRAY | RESPIRATORY_TRACT | 1 refills | Status: DC | PRN

## 2016-12-26 MED ORDER — HYDROCODONE-HOMATROPINE 5-1.5 MG/5ML PO SYRP: 5.0000 mL | ORAL_SOLUTION | Freq: Four times a day (QID) | ORAL | 0 refills | Status: DC | PRN

## 2016-12-26 MED ORDER — AZITHROMYCIN 250 MG PO TABS: 250.0000 mg | ORAL_TABLET | Freq: Every day | ORAL | 0 refills | Status: DC

## 2016-12-26 NOTE — ED Provider Notes (Signed)
[facility]    CSN: [id number] Arrival date & time: 12/26/16  1003     History   Chief Complaint Chief Complaint  Patient presents with  . URI    HPI Brendan [name] is a 61 y.o. male.   This is 61 year old man has had cough and congestion for over a week. The symptoms are associated with fatigue.  No appetite.  Some chest soreness.  PMHx:  Sleep apnea  Sig Negs:  No h/o COPD      Past Medical History:  Diagnosis Date  . Acanthosis nigricans   . Atopic dermatitis   . Diabetes ([place]) 2004  . Erectile dysfunction   . Fatty liver 2007  . Glaucoma 2008  . Gynecomastia 2009  . Hyperaldosteronism ([place]) 2000  . Hypercholesterolemia   . Hyperlipidemia   . Hypertension   . Hypoglycemic reaction   . Hypothyroidism 2004  . Incontinence   . Intermittent vertigo 2012  . Left cervical radiculopathy 2012  . Lumbar radiculopathy   . Obesity   . Peripheral neuropathy ([place])   . Pollen allergies 2007   perennial  . Prostate cancer ([place]) 2006  . Reflux esophagitis 1995  . Sickle cell trait ([place]) 2006  . Sleep apnea, obstructive 2000   uses a cpap  . Venous insufficiency 2005  . Vitamin D deficiency 2012    Patient Active Problem List   Diagnosis Date Noted  . Morbid obesity ([place]) 10/28/2015  . Internal hemorrhoids with bleeding 07/31/2013  . DM 01/11/2010  . Obstructive sleep apnea 01/11/2010  . HYPERTENSION 01/11/2010  . GLAUCOMA 01/10/2010  . Allergic rhinitis 01/10/2010  . PROSTATE CANCER, HX OF 01/10/2010    Past Surgical History:  Procedure Laterality Date  . COLONOSCOPY  2006   normal  . lap band surgery  2009  . left inguinal hernia repair  1998  . robotic prostatectomy  2008       Home Medications    Prior to Admission medications   Medication Sig Start Date End Date Taking? Authorizing Provider  albuterol (PROVENTIL HFA;VENTOLIN HFA) 108 (90 [place]) MCG/ACT inhaler Inhale 2 puffs into the lungs every 4 (four) hours as needed for  wheezing or shortness of breath (cough, shortness of breath or wheezing.). 12/26/16   Robyn Haber, MD  AMILORIDE-HYDROCHLOROTHIAZIDE PO Take by mouth 2 (two) times daily. 5 mg/50 mg tablet 1 bid    Historical Provider, MD  aspirin 81 MG tablet Take 81 mg by mouth daily.    Historical Provider, MD  azithromycin (ZITHROMAX) 250 MG tablet Take 1 tablet (250 mg total) by mouth daily. Take first 2 tablets together, then 1 every day until finished. 12/26/16   Robyn Haber, MD  Blood Glucose Monitoring Suppl (ONE TOUCH ULTRA SYSTEM KIT) W/DEVICE KIT 1 kit by Does not apply route once. One touch ultrasoft lancets    Historical Provider, MD  diltiazem (CARDIZEM CD) 240 MG 24 hr capsule Take 240 mg by mouth daily.  07/05/13   Historical Provider, MD  glucose blood test strip 1 each by Other route as needed for other. Use as instructed one touch test strips    Historical Provider, MD  HYDROcodone-homatropine (HYCODAN) 5-1.5 MG/5ML syrup Take 5 mLs by mouth every 6 (six) hours as needed for cough. 12/26/16   Robyn Haber, MD  Insulin Lispro Prot & Lispro (HUMALOG MIX 75/25 [place]) Inject into the skin. humalog mix 75/25 injection 60-100 units q am sliding scale. Humalog mix 50/50 pen system, disposable 30-40 units  sliding scale at lunch and h.s.    Historical Provider, MD  Lancets Glory Rosebush ULTRASOFT) lancets  06/24/13   Historical Provider, MD  Levothyroxine Sodium (LEVOXYL PO) Take by mouth. 0.125 mg tablet 1 daily    Historical Provider, MD  losartan (COZAAR) 100 MG tablet Take 100 mg by mouth daily.    Historical Provider, MD  Multiple Vitamin (MULTIVITAMINS PO) Take by mouth. Reported on 05/10/2016    Historical Provider, MD  polyethylene glycol (MIRALAX / GLYCOLAX) packet Take 17 g by mouth daily. Reported on 05/10/2016    Historical Provider, MD  potassium chloride (K-DUR) 10 MEQ tablet Take 20 mEq by mouth 2 (two) times daily.     Historical Provider, MD  simvastatin (ZOCOR) 20 MG tablet Take 20 mg by mouth  every evening.    Historical Provider, MD  Vitamin D, Ergocalciferol, (DRISDOL) 50000 UNITS CAPS capsule Take 50,000 Units by mouth. Reported on 05/10/2016    Historical Provider, MD    Family History Family History  Problem Relation Age of Onset  . Hypertension Father     deceased age 22  . Heart attack Father   . COPD Sister   . CVA Sister   . Diabetes Daughter   . Colon cancer Neg Hx   . Stomach cancer Neg Hx     Social History Social History  Substance Use Topics  . Smoking status: Never Smoker  . Smokeless tobacco: Never Used  . Alcohol use Yes     Comment: one drink every few months     Allergies   Ace inhibitors; Maxidex [dexamethasone]; and Verapamil   Review of Systems Review of Systems  Constitutional: Positive for fatigue and fever.  Respiratory: Positive for cough.   Gastrointestinal: Positive for constipation.  Neurological: Positive for dizziness.     Physical Exam Triage Vital Signs ED Triage Vitals [12/26/16 1107]  Enc Vitals Group     BP 140/73     Pulse Rate 67     Resp 20     Temp 98.1 F (36.7 C)     Temp Source Oral     SpO2 97 %     Weight      Height      Head Circumference      Peak Flow      Pain Score      Pain Loc      Pain Edu?      Excl. in [place]?    No data found.   Updated Vital Signs BP 140/73 (BP Location: Right Arm) Comment (BP Location): large cuff  Pulse 67   Temp 98.1 F (36.7 C) (Oral)   Resp 20   SpO2 97%    Physical Exam  Constitutional: He is oriented to person, place, and time. He appears well-developed and well-nourished.  HENT:  Right Ear: External ear normal.  Left Ear: External ear normal.  Mouth/Throat: Oropharynx is clear and moist.  Eyes: Conjunctivae are normal. Pupils are equal, round, and reactive to light.  Neck: Normal range of motion. Neck supple.  Pulmonary/Chest: Effort normal. He has wheezes. He has rales.  Musculoskeletal: Normal range of motion.  Neurological: He is alert and  oriented to person, place, and time.  Skin: Skin is warm and dry.  Nursing note and vitals reviewed.    UC Treatments / Results  Labs (all labs ordered are listed, but only abnormal results are displayed) Labs Reviewed - No data to display  EKG  EKG Interpretation None  Radiology No infiltrate seen on x-ray of chest, increased bronchial markings consistent with bronchitis. Cardiac shadow is normal Procedures Procedures (including critical care time)  Medications Ordered in UC Medications - No data to display   Initial Impression / Assessment and Plan / UC Course  I have reviewed the triage vital signs and the nursing notes.  Pertinent labs & imaging results that were available during my care of the patient were reviewed by me and considered in my medical decision making (see chart for details).     Final Clinical Impressions(s) / UC Diagnoses   Final diagnoses:  Bronchitis    New Prescriptions New Prescriptions   ALBUTEROL (PROVENTIL HFA;VENTOLIN HFA) 108 (90 BASE) MCG/ACT INHALER    Inhale 2 puffs into the lungs every 4 (four) hours as needed for wheezing or shortness of breath (cough, shortness of breath or wheezing.).   AZITHROMYCIN (ZITHROMAX) 250 MG TABLET    Take 1 tablet (250 mg total) by mouth daily. Take first 2 tablets together, then 1 every day until finished.   HYDROCODONE-HOMATROPINE (HYCODAN) 5-1.5 MG/5ML SYRUP    Take 5 mLs by mouth every 6 (six) hours as needed for cough.     Robyn Haber, MD 12/26/16 1200

## 2016-12-26 NOTE — ED Triage Notes (Addendum)
Cough and chest congestion for over a week.  Patient is very tired and feels sob with activity and chest hurts when coughing

## 2017-01-10 DIAGNOSIS — H401131 Primary open-angle glaucoma, bilateral, mild stage: Secondary | ICD-10-CM | POA: Diagnosis not present

## 2017-01-10 DIAGNOSIS — D2311 Other benign neoplasm of skin of right eyelid, including canthus: Secondary | ICD-10-CM | POA: Diagnosis not present

## 2017-01-15 DIAGNOSIS — E7800 Pure hypercholesterolemia, unspecified: Secondary | ICD-10-CM | POA: Diagnosis not present

## 2017-01-15 DIAGNOSIS — I10 Essential (primary) hypertension: Secondary | ICD-10-CM | POA: Diagnosis not present

## 2017-01-15 DIAGNOSIS — E1122 Type 2 diabetes mellitus with diabetic chronic kidney disease: Secondary | ICD-10-CM | POA: Diagnosis not present

## 2017-01-15 DIAGNOSIS — E038 Other specified hypothyroidism: Secondary | ICD-10-CM | POA: Diagnosis not present

## 2017-01-15 DIAGNOSIS — N183 Chronic kidney disease, stage 3 (moderate): Secondary | ICD-10-CM | POA: Diagnosis not present

## 2017-01-29 DIAGNOSIS — I10 Essential (primary) hypertension: Secondary | ICD-10-CM | POA: Diagnosis not present

## 2017-01-29 DIAGNOSIS — E038 Other specified hypothyroidism: Secondary | ICD-10-CM | POA: Diagnosis not present

## 2017-01-29 DIAGNOSIS — R600 Localized edema: Secondary | ICD-10-CM | POA: Diagnosis not present

## 2017-03-11 DIAGNOSIS — E038 Other specified hypothyroidism: Secondary | ICD-10-CM | POA: Diagnosis not present

## 2017-03-22 DIAGNOSIS — E038 Other specified hypothyroidism: Secondary | ICD-10-CM | POA: Diagnosis not present

## 2017-05-06 ENCOUNTER — Telehealth: Payer: Self-pay | Admitting: Pulmonary Disease

## 2017-05-06 NOTE — Telephone Encounter (Signed)
Order for CPAP supplies received  Signed by TP and faxed back to [facility] LMOM TCB x1 to inform patient

## 2017-05-06 NOTE — Telephone Encounter (Signed)
Pt called back and he is aware that [facility] called and had [facility] refax the form back to [place] so this can be signed by VS.  Will forward to AG to follow up on form being signed by VS.  Thanks

## 2017-05-06 NOTE — Telephone Encounter (Signed)
lmom tcb x1 to pt  FYI to nurse: Checked with Janett Billow she does not think she received anything from [facility] on this pt. Called [facility] and they will refax the order.

## 2017-05-07 NOTE — Telephone Encounter (Signed)
Called and lmom to make the pt aware that the form has been signed and faxed back to [facility]

## 2017-05-09 DIAGNOSIS — G4733 Obstructive sleep apnea (adult) (pediatric): Secondary | ICD-10-CM | POA: Diagnosis not present

## 2017-05-10 DIAGNOSIS — N183 Chronic kidney disease, stage 3 (moderate): Secondary | ICD-10-CM | POA: Diagnosis not present

## 2017-05-10 DIAGNOSIS — E1122 Type 2 diabetes mellitus with diabetic chronic kidney disease: Secondary | ICD-10-CM | POA: Diagnosis not present

## 2017-05-10 DIAGNOSIS — E1165 Type 2 diabetes mellitus with hyperglycemia: Secondary | ICD-10-CM | POA: Diagnosis not present

## 2017-05-10 DIAGNOSIS — E039 Hypothyroidism, unspecified: Secondary | ICD-10-CM | POA: Diagnosis not present

## 2017-05-10 DIAGNOSIS — E7800 Pure hypercholesterolemia, unspecified: Secondary | ICD-10-CM | POA: Diagnosis not present

## 2017-06-15 ENCOUNTER — Encounter (HOSPITAL_COMMUNITY): Payer: Self-pay

## 2017-06-15 ENCOUNTER — Other Ambulatory Visit: Payer: Self-pay

## 2017-06-15 ENCOUNTER — Emergency Department (HOSPITAL_COMMUNITY): Payer: 59

## 2017-06-15 ENCOUNTER — Emergency Department (HOSPITAL_COMMUNITY)
Admission: EM | Admit: 2017-06-15 | Discharge: 2017-06-16 | Disposition: A | Discharge status: Home / Self Care | Payer: 59 | Attending: Emergency Medicine | Admitting: Emergency Medicine

## 2017-06-15 DIAGNOSIS — Z5321 Procedure and treatment not carried out due to patient leaving prior to being seen by health care provider: Secondary | ICD-10-CM | POA: Insufficient documentation

## 2017-06-15 DIAGNOSIS — R079 Chest pain, unspecified: Secondary | ICD-10-CM | POA: Insufficient documentation

## 2017-06-15 LAB — BASIC METABOLIC PANEL
Anion gap: 7 (ref 5–15)
BUN: 15 mg/dL (ref 6–20)
CHLORIDE: 103 mmol/L (ref 101–111)
CO2: 25 mmol/L (ref 22–32)
CREATININE: 1.59 mg/dL — AB (ref 0.61–1.24)
Calcium: 8.6 mg/dL — ABNORMAL LOW (ref 8.9–10.3)
GFR calc Af Amer: 52 mL/min — ABNORMAL LOW (ref >60)
GFR calc non Af Amer: 45 mL/min — ABNORMAL LOW (ref >60)
Glucose, Bld: 283 mg/dL — ABNORMAL HIGH (ref 65–99)
Potassium: 3.4 mmol/L — ABNORMAL LOW (ref 3.5–5.1)
SODIUM: 135 mmol/L (ref 135–145)

## 2017-06-15 LAB — I-STAT TROPONIN, ED: Troponin i, poc: 0 ng/mL (ref 0.00–0.08)

## 2017-06-15 LAB — CBC
HCT: 33.6 % — ABNORMAL LOW (ref 39.0–52.0)
Hemoglobin: 11.2 g/dL — ABNORMAL LOW (ref 13.0–17.0)
MCH: 26.1 pg (ref 26.0–34.0)
MCHC: 33.3 g/dL (ref 30.0–36.0)
MCV: 78.3 fL (ref 78.0–100.0)
PLATELETS: 352 10*3/uL (ref 150–400)
RBC: 4.29 MIL/uL (ref 4.22–5.81)
RDW: 14.5 % (ref 11.5–15.5)
WBC: 12 10*3/uL — ABNORMAL HIGH (ref 4.0–10.5)

## 2017-06-15 NOTE — ED Triage Notes (Signed)
Pt reports he was routinely checking his BP today and noted it to be 225/115. Pt then reported left sided chest pain described as "tingling" which he states just started upon his arrival here. Pt denies headache or blurry vision.BP at triage 176/79.

## 2017-06-15 NOTE — ED Notes (Signed)
Pt states that he will be leaving and following up with his primary care physician.

## 2017-07-08 ENCOUNTER — Other Ambulatory Visit: Payer: Self-pay | Admitting: Ophthalmology

## 2017-07-08 DIAGNOSIS — D2311 Other benign neoplasm of skin of right eyelid, including canthus: Secondary | ICD-10-CM | POA: Diagnosis not present

## 2017-07-09 DIAGNOSIS — E038 Other specified hypothyroidism: Secondary | ICD-10-CM | POA: Diagnosis not present

## 2017-07-09 DIAGNOSIS — R600 Localized edema: Secondary | ICD-10-CM | POA: Diagnosis not present

## 2017-07-30 DIAGNOSIS — H401131 Primary open-angle glaucoma, bilateral, mild stage: Secondary | ICD-10-CM | POA: Diagnosis not present

## 2017-10-07 DIAGNOSIS — Z0000 Encounter for general adult medical examination without abnormal findings: Secondary | ICD-10-CM | POA: Diagnosis not present

## 2017-10-07 DIAGNOSIS — E1122 Type 2 diabetes mellitus with diabetic chronic kidney disease: Secondary | ICD-10-CM | POA: Diagnosis not present

## 2017-10-07 DIAGNOSIS — E039 Hypothyroidism, unspecified: Secondary | ICD-10-CM | POA: Diagnosis not present

## 2017-10-07 DIAGNOSIS — Z23 Encounter for immunization: Secondary | ICD-10-CM | POA: Diagnosis not present

## 2017-10-07 DIAGNOSIS — E7800 Pure hypercholesterolemia, unspecified: Secondary | ICD-10-CM | POA: Diagnosis not present

## 2018-01-28 DIAGNOSIS — H401131 Primary open-angle glaucoma, bilateral, mild stage: Secondary | ICD-10-CM | POA: Diagnosis not present

## 2018-03-20 DIAGNOSIS — G4733 Obstructive sleep apnea (adult) (pediatric): Secondary | ICD-10-CM | POA: Diagnosis not present

## 2018-04-07 ENCOUNTER — Ambulatory Visit
Admission: RE | Admit: 2018-04-07 | Discharge: 2018-04-07 | Disposition: A | Discharge status: Home / Self Care | Payer: 59 | Source: Ambulatory Visit | Attending: Internal Medicine | Admitting: Internal Medicine

## 2018-04-07 ENCOUNTER — Other Ambulatory Visit: Payer: Self-pay | Admitting: Internal Medicine

## 2018-04-07 DIAGNOSIS — N183 Chronic kidney disease, stage 3 (moderate): Secondary | ICD-10-CM | POA: Diagnosis not present

## 2018-04-07 DIAGNOSIS — K5900 Constipation, unspecified: Secondary | ICD-10-CM | POA: Diagnosis not present

## 2018-04-07 DIAGNOSIS — E1122 Type 2 diabetes mellitus with diabetic chronic kidney disease: Secondary | ICD-10-CM | POA: Diagnosis not present

## 2018-04-07 DIAGNOSIS — E039 Hypothyroidism, unspecified: Secondary | ICD-10-CM | POA: Diagnosis not present

## 2018-04-07 DIAGNOSIS — E7800 Pure hypercholesterolemia, unspecified: Secondary | ICD-10-CM | POA: Diagnosis not present

## 2018-05-02 DIAGNOSIS — D649 Anemia, unspecified: Secondary | ICD-10-CM | POA: Diagnosis not present

## 2018-09-15 DIAGNOSIS — H401131 Primary open-angle glaucoma, bilateral, mild stage: Secondary | ICD-10-CM | POA: Diagnosis not present

## 2018-09-15 DIAGNOSIS — H52203 Unspecified astigmatism, bilateral: Secondary | ICD-10-CM | POA: Diagnosis not present

## 2018-09-15 DIAGNOSIS — E119 Type 2 diabetes mellitus without complications: Secondary | ICD-10-CM | POA: Diagnosis not present

## 2018-11-03 DIAGNOSIS — E7800 Pure hypercholesterolemia, unspecified: Secondary | ICD-10-CM | POA: Diagnosis not present

## 2018-11-03 DIAGNOSIS — Z0100 Encounter for examination of eyes and vision without abnormal findings: Secondary | ICD-10-CM | POA: Diagnosis not present

## 2018-11-03 DIAGNOSIS — E039 Hypothyroidism, unspecified: Secondary | ICD-10-CM | POA: Diagnosis not present

## 2018-11-03 DIAGNOSIS — Z23 Encounter for immunization: Secondary | ICD-10-CM | POA: Diagnosis not present

## 2018-11-03 DIAGNOSIS — I10 Essential (primary) hypertension: Secondary | ICD-10-CM | POA: Diagnosis not present

## 2018-11-03 DIAGNOSIS — E1122 Type 2 diabetes mellitus with diabetic chronic kidney disease: Secondary | ICD-10-CM | POA: Diagnosis not present

## 2018-11-20 DIAGNOSIS — R944 Abnormal results of kidney function studies: Secondary | ICD-10-CM | POA: Diagnosis not present

## 2018-11-27 DIAGNOSIS — H1132 Conjunctival hemorrhage, left eye: Secondary | ICD-10-CM | POA: Diagnosis not present

## 2019-03-16 DIAGNOSIS — H401131 Primary open-angle glaucoma, bilateral, mild stage: Secondary | ICD-10-CM | POA: Diagnosis not present

## 2019-04-21 ENCOUNTER — Other Ambulatory Visit: Payer: Self-pay | Admitting: Ophthalmology

## 2020-01-29 ENCOUNTER — Ambulatory Visit
Admission: RE | Admit: 2020-01-29 | Discharge: 2020-01-29 | Disposition: A | Discharge status: Home / Self Care | Payer: 59 | Source: Ambulatory Visit | Attending: Internal Medicine | Admitting: Internal Medicine

## 2020-01-29 ENCOUNTER — Other Ambulatory Visit: Payer: Self-pay | Admitting: Internal Medicine

## 2020-01-29 DIAGNOSIS — M79641 Pain in right hand: Secondary | ICD-10-CM

## 2020-03-25 ENCOUNTER — Other Ambulatory Visit: Payer: Self-pay

## 2020-03-25 ENCOUNTER — Encounter (HOSPITAL_COMMUNITY): Payer: Self-pay | Admitting: Emergency Medicine

## 2020-03-25 ENCOUNTER — Other Ambulatory Visit: Payer: Self-pay | Admitting: Internal Medicine

## 2020-03-25 ENCOUNTER — Inpatient Hospital Stay (HOSPITAL_COMMUNITY)
Admission: EM | Admit: 2020-03-25 | Discharge: 2020-03-27 | DRG: 291 | Disposition: A | Discharge status: Home / Self Care | Payer: 59 | Source: Ambulatory Visit | Attending: Internal Medicine | Admitting: Internal Medicine

## 2020-03-25 ENCOUNTER — Ambulatory Visit
Admission: RE | Admit: 2020-03-25 | Discharge: 2020-03-25 | Disposition: A | Discharge status: Home / Self Care | Payer: 59 | Source: Ambulatory Visit | Attending: Internal Medicine | Admitting: Internal Medicine

## 2020-03-25 DIAGNOSIS — Z833 Family history of diabetes mellitus: Secondary | ICD-10-CM

## 2020-03-25 DIAGNOSIS — R079 Chest pain, unspecified: Secondary | ICD-10-CM | POA: Diagnosis not present

## 2020-03-25 DIAGNOSIS — E1122 Type 2 diabetes mellitus with diabetic chronic kidney disease: Secondary | ICD-10-CM | POA: Diagnosis present

## 2020-03-25 DIAGNOSIS — H409 Unspecified glaucoma: Secondary | ICD-10-CM | POA: Diagnosis present

## 2020-03-25 DIAGNOSIS — I10 Essential (primary) hypertension: Secondary | ICD-10-CM | POA: Diagnosis present

## 2020-03-25 DIAGNOSIS — Z823 Family history of stroke: Secondary | ICD-10-CM

## 2020-03-25 DIAGNOSIS — E785 Hyperlipidemia, unspecified: Secondary | ICD-10-CM | POA: Diagnosis present

## 2020-03-25 DIAGNOSIS — Z6841 Body Mass Index (BMI) 40.0 and over, adult: Secondary | ICD-10-CM

## 2020-03-25 DIAGNOSIS — Z7982 Long term (current) use of aspirin: Secondary | ICD-10-CM

## 2020-03-25 DIAGNOSIS — R918 Other nonspecific abnormal finding of lung field: Secondary | ICD-10-CM

## 2020-03-25 DIAGNOSIS — M109 Gout, unspecified: Secondary | ICD-10-CM | POA: Diagnosis present

## 2020-03-25 DIAGNOSIS — Z825 Family history of asthma and other chronic lower respiratory diseases: Secondary | ICD-10-CM

## 2020-03-25 DIAGNOSIS — R0600 Dyspnea, unspecified: Secondary | ICD-10-CM

## 2020-03-25 DIAGNOSIS — E119 Type 2 diabetes mellitus without complications: Secondary | ICD-10-CM

## 2020-03-25 DIAGNOSIS — E039 Hypothyroidism, unspecified: Secondary | ICD-10-CM | POA: Diagnosis present

## 2020-03-25 DIAGNOSIS — I152 Hypertension secondary to endocrine disorders: Secondary | ICD-10-CM | POA: Diagnosis present

## 2020-03-25 DIAGNOSIS — E876 Hypokalemia: Secondary | ICD-10-CM | POA: Diagnosis present

## 2020-03-25 DIAGNOSIS — I130 Hypertensive heart and chronic kidney disease with heart failure and stage 1 through stage 4 chronic kidney disease, or unspecified chronic kidney disease: Principal | ICD-10-CM | POA: Diagnosis present

## 2020-03-25 DIAGNOSIS — N289 Disorder of kidney and ureter, unspecified: Secondary | ICD-10-CM

## 2020-03-25 DIAGNOSIS — I5021 Acute systolic (congestive) heart failure: Secondary | ICD-10-CM | POA: Diagnosis present

## 2020-03-25 DIAGNOSIS — E1142 Type 2 diabetes mellitus with diabetic polyneuropathy: Secondary | ICD-10-CM | POA: Diagnosis present

## 2020-03-25 DIAGNOSIS — E7800 Pure hypercholesterolemia, unspecified: Secondary | ICD-10-CM | POA: Diagnosis present

## 2020-03-25 DIAGNOSIS — D72829 Elevated white blood cell count, unspecified: Secondary | ICD-10-CM | POA: Diagnosis present

## 2020-03-25 DIAGNOSIS — R0609 Other forms of dyspnea: Secondary | ICD-10-CM

## 2020-03-25 DIAGNOSIS — Z8546 Personal history of malignant neoplasm of prostate: Secondary | ICD-10-CM

## 2020-03-25 DIAGNOSIS — R0789 Other chest pain: Secondary | ICD-10-CM | POA: Diagnosis not present

## 2020-03-25 DIAGNOSIS — Z888 Allergy status to other drugs, medicaments and biological substances status: Secondary | ICD-10-CM

## 2020-03-25 DIAGNOSIS — G4733 Obstructive sleep apnea (adult) (pediatric): Secondary | ICD-10-CM | POA: Diagnosis present

## 2020-03-25 DIAGNOSIS — I509 Heart failure, unspecified: Secondary | ICD-10-CM

## 2020-03-25 DIAGNOSIS — Z20822 Contact with and (suspected) exposure to covid-19: Secondary | ICD-10-CM | POA: Diagnosis present

## 2020-03-25 DIAGNOSIS — E669 Obesity, unspecified: Secondary | ICD-10-CM | POA: Diagnosis present

## 2020-03-25 DIAGNOSIS — M10342 Gout due to renal impairment, left hand: Secondary | ICD-10-CM | POA: Diagnosis not present

## 2020-03-25 DIAGNOSIS — M5416 Radiculopathy, lumbar region: Secondary | ICD-10-CM | POA: Diagnosis present

## 2020-03-25 DIAGNOSIS — I5033 Acute on chronic diastolic (congestive) heart failure: Secondary | ICD-10-CM | POA: Diagnosis not present

## 2020-03-25 DIAGNOSIS — E269 Hyperaldosteronism, unspecified: Secondary | ICD-10-CM | POA: Diagnosis present

## 2020-03-25 DIAGNOSIS — N183 Chronic kidney disease, stage 3 unspecified: Secondary | ICD-10-CM | POA: Diagnosis present

## 2020-03-25 DIAGNOSIS — Z8249 Family history of ischemic heart disease and other diseases of the circulatory system: Secondary | ICD-10-CM

## 2020-03-25 DIAGNOSIS — Z794 Long term (current) use of insulin: Secondary | ICD-10-CM

## 2020-03-25 DIAGNOSIS — I5043 Acute on chronic combined systolic (congestive) and diastolic (congestive) heart failure: Secondary | ICD-10-CM | POA: Diagnosis present

## 2020-03-25 LAB — CBC
HCT: 34.9 % — ABNORMAL LOW (ref 39.0–52.0)
Hemoglobin: 11.2 g/dL — ABNORMAL LOW (ref 13.0–17.0)
MCH: 27.3 pg (ref 26.0–34.0)
MCHC: 32.1 g/dL (ref 30.0–36.0)
MCV: 85.1 fL (ref 80.0–100.0)
Platelets: 383 10*3/uL (ref 150–400)
RBC: 4.1 MIL/uL — ABNORMAL LOW (ref 4.22–5.81)
RDW: 13.6 % (ref 11.5–15.5)
WBC: 14.8 10*3/uL — ABNORMAL HIGH (ref 4.0–10.5)
nRBC: 0 % (ref 0.0–0.2)

## 2020-03-25 LAB — BASIC METABOLIC PANEL
Anion gap: 13 (ref 5–15)
BUN: 19 mg/dL (ref 8–23)
CO2: 27 mmol/L (ref 22–32)
Calcium: 8.4 mg/dL — ABNORMAL LOW (ref 8.9–10.3)
Chloride: 99 mmol/L (ref 98–111)
Creatinine, Ser: 2.24 mg/dL — ABNORMAL HIGH (ref 0.61–1.24)
GFR calc Af Amer: 35 mL/min — ABNORMAL LOW (ref >60)
GFR calc non Af Amer: 30 mL/min — ABNORMAL LOW (ref >60)
Glucose, Bld: 114 mg/dL — ABNORMAL HIGH (ref 70–99)
Potassium: 2.6 mmol/L — CL (ref 3.5–5.1)
Sodium: 139 mmol/L (ref 135–145)

## 2020-03-25 LAB — GLUCOSE, CAPILLARY: Glucose-Capillary: 66 mg/dL — ABNORMAL LOW (ref 70–99)

## 2020-03-25 LAB — BRAIN NATRIURETIC PEPTIDE: B Natriuretic Peptide: 665.2 pg/mL — ABNORMAL HIGH (ref 0.0–100.0)

## 2020-03-25 LAB — MAGNESIUM: Magnesium: 1.5 mg/dL — ABNORMAL LOW (ref 1.7–2.4)

## 2020-03-25 LAB — TROPONIN I (HIGH SENSITIVITY)
Troponin I (High Sensitivity): 28 ng/L — ABNORMAL HIGH (ref <18)
Troponin I (High Sensitivity): 26 ng/L — ABNORMAL HIGH (ref <18)

## 2020-03-25 MED ORDER — INSULIN ASPART 100 UNIT/ML ~~LOC~~ SOLN
0.0000 [IU] | Freq: Three times a day (TID) | SUBCUTANEOUS | Status: DC
  Administered 2020-03-26 (×2): 5 [IU] via SUBCUTANEOUS
  Administered 2020-03-26: 3 [IU] via SUBCUTANEOUS
  Administered 2020-03-27: 9 [IU] via SUBCUTANEOUS

## 2020-03-25 MED ORDER — SODIUM CHLORIDE 0.9% FLUSH
3.0000 mL | Freq: Two times a day (BID) | INTRAVENOUS | Status: DC
  Administered 2020-03-25 – 2020-03-27 (×4): 3 mL via INTRAVENOUS

## 2020-03-25 MED ORDER — COLCHICINE 0.6 MG PO TABS
0.6000 mg | ORAL_TABLET | Freq: Once | ORAL | Status: AC
  Administered 2020-03-25: 0.6 mg via ORAL
  Filled 2020-03-25: qty 1

## 2020-03-25 MED ORDER — LEVOTHYROXINE SODIUM 75 MCG PO TABS
175.0000 ug | ORAL_TABLET | Freq: Every day | ORAL | Status: DC
  Administered 2020-03-26 – 2020-03-27 (×2): 175 ug via ORAL
  Filled 2020-03-25 (×2): qty 1

## 2020-03-25 MED ORDER — HEPARIN SODIUM (PORCINE) 5000 UNIT/ML IJ SOLN
5000.0000 [IU] | Freq: Three times a day (TID) | INTRAMUSCULAR | Status: DC
  Administered 2020-03-26 – 2020-03-27 (×3): 5000 [IU] via SUBCUTANEOUS
  Filled 2020-03-25 (×3): qty 1

## 2020-03-25 MED ORDER — POTASSIUM CHLORIDE 10 MEQ/100ML IV SOLN
10.0000 meq | INTRAVENOUS | Status: DC
  Filled 2020-03-25: qty 100

## 2020-03-25 MED ORDER — CARVEDILOL 25 MG PO TABS
25.0000 mg | ORAL_TABLET | Freq: Two times a day (BID) | ORAL | Status: DC
  Administered 2020-03-25 – 2020-03-27 (×4): 25 mg via ORAL
  Filled 2020-03-25 (×4): qty 1

## 2020-03-25 MED ORDER — ACETAMINOPHEN 325 MG PO TABS
650.0000 mg | ORAL_TABLET | ORAL | Status: DC | PRN
  Administered 2020-03-25: 650 mg via ORAL
  Filled 2020-03-25: qty 2

## 2020-03-25 MED ORDER — BRIMONIDINE TARTRATE 0.2 % OP SOLN
1.0000 [drp] | Freq: Two times a day (BID) | OPHTHALMIC | Status: DC
  Administered 2020-03-26 – 2020-03-27 (×3): 1 [drp] via OPHTHALMIC
  Filled 2020-03-25: qty 5

## 2020-03-25 MED ORDER — POTASSIUM CHLORIDE CRYS ER 20 MEQ PO TBCR
20.0000 meq | EXTENDED_RELEASE_TABLET | Freq: Two times a day (BID) | ORAL | Status: DC
  Administered 2020-03-26 – 2020-03-27 (×3): 20 meq via ORAL
  Filled 2020-03-25 (×3): qty 1

## 2020-03-25 MED ORDER — INSULIN GLARGINE 100 UNIT/ML ~~LOC~~ SOLN
10.0000 [IU] | Freq: Every day | SUBCUTANEOUS | Status: DC
  Administered 2020-03-26: 22:00:00 10 [IU] via SUBCUTANEOUS
  Filled 2020-03-25 (×3): qty 0.1

## 2020-03-25 MED ORDER — ONDANSETRON HCL 4 MG/2ML IJ SOLN: 4.0000 mg | Freq: Four times a day (QID) | INTRAMUSCULAR | Status: DC | PRN

## 2020-03-25 MED ORDER — SODIUM CHLORIDE 0.9% FLUSH: 3.0000 mL | Freq: Once | INTRAVENOUS | Status: DC

## 2020-03-25 MED ORDER — TIMOLOL MALEATE 0.5 % OP SOLN
1.0000 [drp] | Freq: Two times a day (BID) | OPHTHALMIC | Status: DC
  Administered 2020-03-26 – 2020-03-27 (×3): 1 [drp] via OPHTHALMIC
  Filled 2020-03-25: qty 5

## 2020-03-25 MED ORDER — SIMVASTATIN 20 MG PO TABS
20.0000 mg | ORAL_TABLET | Freq: Every evening | ORAL | Status: DC
  Administered 2020-03-25 – 2020-03-26 (×2): 20 mg via ORAL
  Filled 2020-03-25 (×2): qty 1

## 2020-03-25 MED ORDER — POTASSIUM CHLORIDE CRYS ER 20 MEQ PO TBCR
40.0000 meq | EXTENDED_RELEASE_TABLET | Freq: Once | ORAL | Status: AC
  Administered 2020-03-25: 40 meq via ORAL
  Filled 2020-03-25: qty 2

## 2020-03-25 MED ORDER — MAGNESIUM SULFATE 2 GM/50ML IV SOLN
2.0000 g | Freq: Once | INTRAVENOUS | Status: AC
  Administered 2020-03-25: 2 g via INTRAVENOUS
  Filled 2020-03-25: qty 50

## 2020-03-25 MED ORDER — SODIUM CHLORIDE 0.9 % IV SOLN: 250.0000 mL | INTRAVENOUS | Status: DC | PRN

## 2020-03-25 MED ORDER — POTASSIUM CHLORIDE 10 MEQ/100ML IV SOLN
10.0000 meq | INTRAVENOUS | Status: AC
  Administered 2020-03-26 (×3): 10 meq via INTRAVENOUS
  Filled 2020-03-25 (×3): qty 100

## 2020-03-25 MED ORDER — ALBUTEROL SULFATE (2.5 MG/3ML) 0.083% IN NEBU: 2.5000 mg | INHALATION_SOLUTION | RESPIRATORY_TRACT | Status: DC | PRN

## 2020-03-25 MED ORDER — BRIMONIDINE TARTRATE-TIMOLOL 0.2-0.5 % OP SOLN: 1.0000 [drp] | Freq: Two times a day (BID) | OPHTHALMIC | Status: DC

## 2020-03-25 MED ORDER — SODIUM CHLORIDE 0.9% FLUSH: 3.0000 mL | INTRAVENOUS | Status: DC | PRN

## 2020-03-25 NOTE — ED Provider Notes (Signed)
[facility] EMERGENCY DEPARTMENT Provider Note   CSN: [id number] Arrival date & time: 03/25/20  1608     History Chief Complaint  Patient presents with  . Chest Pain  . Shortness of Breath    Brendan [name] is a 64 y.o. male with past medical history of hypertension, hyperlipidemia, diabetes presenting to the ED with a chief complaint of chest pain and shortness of breath.  1 month ago patient had bilateral lower extremity edema.  He was prescribed 80 mg of Lasix twice a day by his PCP.  He may have noticed some improvement with this medication although 1 week ago started developing worsening swelling, chest pain and shortness of breath worse with exertion.  He continues to take his potassium pills and Lasix but continues to be symptomatic.  He was seen and evaluated by his PCP and sent to the ED for concerns for CHF.  He denies any supplemental oxygen use at baseline.  Denies any chest pain currently.  He denies any fever, cough, abdominal pain, vomiting or diarrhea.  He denies history of DVT or PE, recent immobilization.  He does note that his left lower extremity is slightly more swollen than his right.  HPI     Past Medical History:  Diagnosis Date  . Acanthosis nigricans   . Atopic dermatitis   . Diabetes ([place]) 2004  . Erectile dysfunction   . Fatty liver 2007  . Glaucoma 2008  . Gynecomastia 2009  . Hyperaldosteronism ([place]) 2000  . Hypercholesterolemia   . Hyperlipidemia   . Hypertension   . Hypoglycemic reaction   . Hypothyroidism 2004  . Incontinence   . Intermittent vertigo 2012  . Left cervical radiculopathy 2012  . Lumbar radiculopathy   . Obesity   . Peripheral neuropathy   . Pollen allergies 2007   perennial  . Prostate cancer ([place]) 2006  . Reflux esophagitis 1995  . Sickle cell trait ([place]) 2006  . Sleep apnea, obstructive 2000   uses a cpap  . Venous insufficiency 2005  . Vitamin D deficiency 2012    Patient Active Problem List   Diagnosis Date Noted  . Acute on chronic diastolic CHF (congestive heart failure) ([place]) 03/25/2020  . Hypokalemia 03/25/2020  . Leukocytosis 03/25/2020  . Renal insufficiency 03/25/2020  . Chest pain 03/25/2020  . Hypothyroidism 03/25/2020  . Morbid obesity ([place]) 10/28/2015  . Internal hemorrhoids with bleeding 07/31/2013  . Insulin-requiring or dependent type II diabetes mellitus ([place]) 01/11/2010  . Obstructive sleep apnea 01/11/2010  . Hypertension 01/11/2010  . GLAUCOMA 01/10/2010  . Allergic rhinitis 01/10/2010  . PROSTATE CANCER, HX OF 01/10/2010    Past Surgical History:  Procedure Laterality Date  . COLONOSCOPY  2006   normal  . lap band surgery  2009  . left inguinal hernia repair  1998  . robotic prostatectomy  2008       Family History  Problem Relation Age of Onset  . Hypertension Father        deceased age 34  . Heart attack Father   . COPD Sister   . CVA Sister   . Diabetes Daughter   . Colon cancer Neg Hx   . Stomach cancer Neg Hx     Social History   Tobacco Use  . Smoking status: Never Smoker  . Smokeless tobacco: Never Used  Substance Use Topics  . Alcohol use: Yes    Comment: one drink every few months  . Drug use: No  Home Medications Prior to Admission medications   Medication Sig Start Date End Date Taking? Authorizing Provider  albuterol (PROVENTIL HFA;VENTOLIN HFA) 108 (90 Base) MCG/ACT inhaler Inhale 2 puffs into the lungs every 4 (four) hours as needed for wheezing or shortness of breath (cough, shortness of breath or wheezing.). 12/26/16   Robyn Haber, MD  AMILORIDE-HYDROCHLOROTHIAZIDE PO Take by mouth 2 (two) times daily. 5 mg/50 mg tablet 1 bid    [provider]  aspirin 81 MG tablet Take 81 mg by mouth daily.    [provider]  azithromycin (ZITHROMAX) 250 MG tablet Take 1 tablet (250 mg total) by mouth daily. Take first 2 tablets together, then 1 every day until finished. 12/26/16   Robyn Haber, MD    Blood Glucose Monitoring Suppl (ONE TOUCH ULTRA SYSTEM KIT) W/DEVICE KIT 1 kit by Does not apply route once. One touch ultrasoft lancets    [provider]  diltiazem (CARDIZEM CD) 240 MG 24 hr capsule Take 240 mg by mouth daily.  07/05/13   [provider]  glucose blood test strip 1 each by Other route as needed for other. Use as instructed one touch test strips    [provider]  HYDROcodone-homatropine (HYCODAN) 5-1.5 MG/5ML syrup Take 5 mLs by mouth every 6 (six) hours as needed for cough. 12/26/16   Robyn Haber, MD  Insulin Lispro Prot & Lispro (HUMALOG MIX 75/25 [place]) Inject into the skin. humalog mix 75/25 injection 60-100 units q am sliding scale. Humalog mix 50/50 pen system, disposable 30-40 units sliding scale at lunch and h.s.    [provider]  Lancets Glory Rosebush ULTRASOFT) lancets  06/24/13   [provider]  Levothyroxine Sodium (LEVOXYL PO) Take by mouth. 0.125 mg tablet 1 daily    [provider]  losartan (COZAAR) 100 MG tablet Take 100 mg by mouth daily.    [provider]  Multiple Vitamin (MULTIVITAMINS PO) Take by mouth. Reported on 05/10/2016    [provider]  polyethylene glycol (MIRALAX / GLYCOLAX) packet Take 17 g by mouth daily. Reported on 05/10/2016    [provider]  potassium chloride (K-DUR) 10 MEQ tablet Take 20 mEq by mouth 2 (two) times daily.     [provider]  simvastatin (ZOCOR) 20 MG tablet Take 20 mg by mouth every evening.    [provider]  Vitamin D, Ergocalciferol, (DRISDOL) 50000 UNITS CAPS capsule Take 50,000 Units by mouth. Reported on 05/10/2016    [provider]    Allergies    Ace inhibitors, Maxidex [dexamethasone], and Verapamil  Review of Systems   Review of Systems  Constitutional: Negative for appetite change, chills and fever.  HENT: Negative for ear pain, rhinorrhea, sneezing and sore throat.   Eyes: Negative for photophobia  and visual disturbance.  Respiratory: Positive for shortness of breath. Negative for cough, chest tightness and wheezing.   Cardiovascular: Positive for chest pain and leg swelling. Negative for palpitations.  Gastrointestinal: Negative for abdominal pain, blood in stool, constipation, diarrhea, nausea and vomiting.  Genitourinary: Negative for dysuria, hematuria and urgency.  Musculoskeletal: Negative for myalgias.  Skin: Negative for rash.  Neurological: Negative for dizziness, weakness and light-headedness.    Physical Exam Updated Vital Signs BP (!) 177/77 (BP Location: Left Arm)   Pulse 64   Temp 98.4 F (36.9 C) (Oral)   Resp 18   Ht _0  (1.88 m)   Wt (!) 147.4 kg   SpO2 97%  BMI 41.73 kg/m   Physical Exam Vitals and nursing note reviewed.  Constitutional:      General: He is not in acute distress.    Appearance: He is well-developed. He is obese.     Comments: Speaking in complete sentences without difficulty.  Ambulatory without difficulty.  HENT:     Head: Normocephalic and atraumatic.     Nose: Nose normal.  Eyes:     General: No scleral icterus.       Left eye: No discharge.     Conjunctiva/sclera: Conjunctivae normal.  Cardiovascular:     Rate and Rhythm: Normal rate and regular rhythm.     Heart sounds: Normal heart sounds. No murmur. No friction rub. No gallop.   Pulmonary:     Effort: Pulmonary effort is normal. No respiratory distress.     Breath sounds: Normal breath sounds.  Abdominal:     General: Bowel sounds are normal. There is no distension.     Palpations: Abdomen is soft.     Tenderness: There is no abdominal tenderness. There is no guarding.  Musculoskeletal:        General: Normal range of motion.     Cervical back: Normal range of motion and neck supple.     Right lower leg: Edema present.     Left lower leg: Edema present.     Comments: Pitting edema to bilateral lower extremities, left slightly greater than right.  Some edema noted  to the left hand as well.  2+ DP pulse noted bilaterally.   Skin:    General: Skin is warm and dry.     Findings: No rash.  Neurological:     Mental Status: He is alert.     Motor: No abnormal muscle tone.     Coordination: Coordination normal.     ED Results / Procedures / Treatments   Labs (all labs ordered are listed, but only abnormal results are displayed) Labs Reviewed  BASIC METABOLIC PANEL - Abnormal; Notable for the following components:      Result Value   Potassium 2.6 (*)    Glucose, Bld 114 (*)    Creatinine, Ser 2.24 (*)    Calcium 8.4 (*)    GFR calc non Af Amer 30 (*)    GFR calc Af Amer 35 (*)    All other components within normal limits  CBC - Abnormal; Notable for the following components:   WBC 14.8 (*)    RBC 4.10 (*)    Hemoglobin 11.2 (*)    HCT 34.9 (*)    All other components within normal limits  BRAIN NATRIURETIC PEPTIDE - Abnormal; Notable for the following components:   B Natriuretic Peptide 665.2 (*)    All other components within normal limits  TROPONIN I (HIGH SENSITIVITY) - Abnormal; Notable for the following components:   Troponin I (High Sensitivity) 28 (*)    All other components within normal limits  SARS CORONAVIRUS 2 (TAT 6-24 HRS)  MAGNESIUM  TROPONIN I (HIGH SENSITIVITY)    EKG None  Radiology DG Chest 2 View  Result Date: 03/25/2020 CLINICAL DATA:  Shortness of breath. EXAM: CHEST - 2 VIEW COMPARISON:  06/15/2017. FINDINGS: Cardiomegaly with bilateral interstitial prominence. Small right pleural effusion cannot be excluded. Findings suggest CHF. Pneumonitis cannot be excluded. Biapical pleural thickening again noted consistent scarring. No pneumothorax. Degenerative change thoracic spine. IMPRESSION: Cardiomegaly with mild bilateral interstitial prominence. Small right pleural effusion cannot be excluded. Findings suggest CHF. Electronically Signed  By: Thompson   On: 03/25/2020 12:15   DG Chest Right  Decubitus  Result Date: 03/25/2020 CLINICAL DATA:  Dyspnea on exertion for 2 weeks. EXAM: CHEST - RIGHT DECUBITUS COMPARISON:  Chest x-ray 03/25/2020 FINDINGS: Examination limited by body habitus but findings suspicious for a small layering right pleural effusion. IMPRESSION: Suspect small layering right pleural effusion. Electronically Signed   By: Marijo Sanes M.D.   On: 03/25/2020 12:17    Procedures .Critical Care Performed by: Delia Heady, PA-C Authorized by: Delia Heady, PA-C   Critical care provider statement:    Critical care time (minutes):  35   Critical care was necessary to treat or prevent imminent or life-threatening deterioration of the following conditions:  Cardiac failure, circulatory failure, respiratory failure, renal failure and endocrine crisis   Critical care was time spent personally by me on the following activities:  Development of treatment plan with patient or surrogate, discussions with consultants, evaluation of patient's response to treatment, examination of patient, interpretation of cardiac output measurements, obtaining history from patient or surrogate, ordering and performing treatments and interventions, ordering and review of laboratory studies, ordering and review of radiographic studies, pulse oximetry, re-evaluation of patient's condition and review of old charts   (including critical care time)  Medications Ordered in ED Medications  sodium chloride flush (NS) 0.9 % injection 3 mL (has no administration in time range)  potassium chloride SA (KLOR-CON) CR tablet 40 mEq (has no administration in time range)    ED Course  I have reviewed the triage vital signs and the nursing notes.  Pertinent labs & imaging results that were available during my care of the patient were reviewed by me and considered in my medical decision making (see chart for details).    MDM Rules/Calculators/A&P                      63 year old male with past medical history  of hypertension, hyperlipidemia presenting to the ED with a chief complaint of chest pain and shortness of breath.  States that 1 month ago was prescribed Lasix by his PCP for bilateral lower extremity edema.  1 week ago started experiencing chest pain, shortness of breath worse with exertion.  Also noticed worsening of his bilateral lower extremity edema.  He was sent to the ED because his chest x-ray today showed cardiomegaly and signs of CHF.  On exam patient does have bilateral lower extremity edema, left slightly greater than right.  He also has left hand swelling which he was told was due to gout by his PCP today.  His oxygen saturations are above 95% on room air.  Lab work here significant for hypokalemia of 2.6, elevation in BNP to 665, slight elevation troponin to 85 which I believe could be due to the new CHF.  Magnesium level has been ordered.  Creatinine slightly elevated at 2.2 although unsure how similar this is to his baseline as we have no recent lab values for him.  Patient was given supplemental potassium here.  He will require ongoing management of diuresis and repletion of his potassium during this process.  Will admit to hospitalist service.  All imaging, if done today, including plain films, CT scans, and ultrasounds, independently reviewed by me, and interpretations confirmed via formal radiology reads.   Portions of this note were generated with [employer]. Dictation errors may occur despite best attempts at proofreading.  Final Clinical Impression(s) / ED Diagnoses Final diagnoses:  Acute  congestive heart failure, unspecified heart failure type ([place])  Hypokalemia    Rx / DC Orders ED Discharge Orders    None       Delia Heady, PA-C 03/25/20 2048    Charlesetta Shanks, MD 03/25/20 2309

## 2020-03-25 NOTE — H&P (Signed)
History and Physical    Janice Seales VZS:[id number] DOB: 10/13/1956 DOA: 03/25/2020  PCP: Seward Carol, MD   Patient coming from: Home   Chief Complaint: Bilateral leg swelling, DOE, chest pain   HPI: Ariz Terrones is a 64 y.o. male with medical history significant for chronic kidney disease, OSA, hypertension, insulin-dependent diabetes mellitus, hypothyroidism, chronic diastolic CHF, and gout, presenting to the emergency department for evaluation of bilateral lower extremity edema, exertional dyspnea, and chest discomfort.  Patient reports approximately 2 weeks of chest discomfort and shortness of breath with progressive bilateral leg swelling.  He has been taking 80 mg of Lasix with potassium supplement at home, but has not noticed much of any improvement.  Chest discomfort is mild, localized to the central chest, associated with shortness of breath, but no nausea or diaphoresis.  Denies any cough, fevers, chills, or sick contacts.  He has been unable to lay flat recently but reports that this is due to vertigo more than dyspnea.  He has had several days of left hand pain, swelling, and redness similar to gout attacks that he has experienced in the contralateral hand.  There is no fevers, chills, wound, or drainage associated with this.  ED Course: Upon arrival to the ED, patient is found to be afebrile, saturating well on room air, and hypertensive to 180/80.  EKG features sinus rhythm with sinus arrhythmia.  Chest x-ray notable for cardiomegaly, interstitial prominence bilaterally, and suspected small pleural effusion, suspicious for CHF.  Chemistry panel notable for potassium 2.6 and creatinine 2.24, up from 1.6 in 2018.  CBC with leukocytosis that appears to be chronic and a slight normocytic anemia.  Troponin is elevated to 28 and BNP elevated to 665.  40 mEq of IV potassium, COVID-19 screening test, and magnesium levels were ordered from the ED and hospitalist asked to admit.  Review of Systems:   All other systems reviewed and apart from HPI, are negative.  Past Medical History:  Diagnosis Date  . Acanthosis nigricans   . Atopic dermatitis   . Diabetes ([place]) 2004  . Erectile dysfunction   . Fatty liver 2007  . Glaucoma 2008  . Gynecomastia 2009  . Hyperaldosteronism ([place]) 2000  . Hypercholesterolemia   . Hyperlipidemia   . Hypertension   . Hypoglycemic reaction   . Hypothyroidism 2004  . Incontinence   . Intermittent vertigo 2012  . Left cervical radiculopathy 2012  . Lumbar radiculopathy   . Obesity   . Peripheral neuropathy   . Pollen allergies 2007   perennial  . Prostate cancer ([place]) 2006  . Reflux esophagitis 1995  . Sickle cell trait ([place]) 2006  . Sleep apnea, obstructive 2000   uses a cpap  . Venous insufficiency 2005  . Vitamin D deficiency 2012    Past Surgical History:  Procedure Laterality Date  . COLONOSCOPY  2006   normal  . lap band surgery  2009  . left inguinal hernia repair  1998  . robotic prostatectomy  2008     reports that he has never smoked. He has never used smokeless tobacco. He reports current alcohol use. He reports that he does not use drugs.  Allergies  Allergen Reactions  . Ace Inhibitors     REACTION: cough  . Maxidex [Dexamethasone] Other (See Comments)    Sexual dysfunction  . Verapamil     REACTION: constipation    Family History  Problem Relation Age of Onset  . Hypertension Father  deceased age 28  . Heart attack Father   . COPD Sister   . CVA Sister   . Diabetes Daughter   . Colon cancer Neg Hx   . Stomach cancer Neg Hx      Prior to Admission medications   Medication Sig Start Date End Date Taking? Authorizing Provider  acetaminophen (TYLENOL) 500 MG tablet Take 500 mg by mouth every 6 (six) hours as needed for mild pain.   Yes [provider]  albuterol (PROVENTIL HFA;VENTOLIN HFA) 108 (90 Base) MCG/ACT inhaler Inhale 2 puffs into the lungs every 4 (four) hours as needed for wheezing  or shortness of breath (cough, shortness of breath or wheezing.). 12/26/16  Yes Robyn Haber, MD  Blood Glucose Monitoring Suppl (ONE TOUCH ULTRA SYSTEM KIT) W/DEVICE KIT 1 kit by Does not apply route once. One touch ultrasoft lancets   Yes [provider]  brimonidine-timolol (COMBIGAN) 0.2-0.5 % ophthalmic solution Place 1 drop into both eyes every 12 (twelve) hours.   Yes [provider]  carvedilol (COREG) 25 MG tablet Take 25 mg by mouth 2 (two) times daily. 03/15/20  Yes [provider]  cetirizine (ZYRTEC) 10 MG tablet Take 10 mg by mouth daily as needed for allergies.   Yes [provider]  furosemide (LASIX) 40 MG tablet Take 80 mg by mouth daily.   Yes [provider]  glucose blood test strip 1 each by Other route as needed for other. Use as instructed one touch test strips   Yes [provider]  Insulin Lispro Prot & Lispro (HUMALOG MIX 75/25 [place]) Inject 60-100 Units into the skin daily. humalog mix 75/25 injection 60-100 units q am sliding scale. Take 60 units if sugar is <180, then take 100 units if sugar is over 400   Yes [provider]  insulin lispro protamine-lispro (HUMALOG 50/50 MIX) (50-50) 100 UNIT/ML SUSP injection Inject 40 Units into the skin daily.   Yes [provider]  Lancets Glory Rosebush ULTRASOFT) lancets  06/24/13  Yes [provider]  levothyroxine (SYNTHROID) 175 MCG tablet Take 175 mcg by mouth daily before breakfast.   Yes [provider]  potassium chloride (K-DUR) 10 MEQ tablet Take 20 mEq by mouth 2 (two) times daily.    Yes [provider]  simvastatin (ZOCOR) 20 MG tablet Take 20 mg by mouth every evening.   Yes [provider]  telmisartan (MICARDIS) 80 MG tablet Take 80 mg by mouth in the morning and at bedtime.   Yes [provider]  TRULICITY 3 OY/7.7AJ SOPN Inject 3.5 mg into the skin once a week. 03/05/20  Yes [provider]    azithromycin (ZITHROMAX) 250 MG tablet Take 1 tablet (250 mg total) by mouth daily. Take first 2 tablets together, then 1 every day until finished. Patient not taking: Reported on 03/25/2020 12/26/16   Robyn Haber, MD  HYDROcodone-homatropine [facility]) 5-1.5 MG/5ML syrup Take 5 mLs by mouth every 6 (six) hours as needed for cough. Patient not taking: Reported on 03/25/2020 12/26/16   Robyn Haber, MD    Physical Exam: Vitals:   03/25/20 1618  BP: (!) 177/77  Pulse: 64  Resp: 18  Temp: 98.4 F (36.9 C)  TempSrc: Oral  SpO2: 97%  Weight: (!) 147.4 kg  Height: 6' 2"  (1.88 m)    Constitutional: NAD, calm  Eyes: PERTLA, lids and conjunctivae normal ENMT: Mucous membranes are moist. Posterior pharynx clear of any exudate or lesions.   Neck: normal,  supple, no masses, no thyromegaly Respiratory:  no wheezing, no crackles. No accessory muscle use.  Cardiovascular: S1 & S2 heard, regular rate and rhythm. Pitting edema involving bilateral legs. Abdomen: No distension, no tenderness, soft. Bowel sounds active.  Musculoskeletal: no clubbing / cyanosis. Dorsal left hand edema, warmth, and tenderness.   Skin: no significant rashes, lesions, ulcers. Warm, dry, well-perfused. Neurologic: CN 2-12 grossly intact. Sensation intact. Moving all extremities.  Psychiatric: Alert and oriented to person, place, and situation. Very pleasant and cooperative.    Labs and Imaging on Admission: I have personally reviewed following labs and imaging studies  CBC: Recent Labs  Lab 03/25/20 1805  WBC 14.8*  HGB 11.2*  HCT 34.9*  MCV 85.1  PLT 333   Basic Metabolic Panel: Recent Labs  Lab 03/25/20 1805  NA 139  K 2.6*  CL 99  CO2 27  GLUCOSE 114*  BUN 19  CREATININE 2.24*  CALCIUM 8.4*   GFR: Estimated Creatinine Clearance: 51 mL/min (A) (by C-G formula based on SCr of 2.24 mg/dL (H)). Liver Function Tests: No results for input(s): AST, ALT, ALKPHOS, BILITOT, PROT, ALBUMIN in the last  168 hours. No results for input(s): LIPASE, AMYLASE in the last 168 hours. No results for input(s): AMMONIA in the last 168 hours. Coagulation Profile: No results for input(s): INR, PROTIME in the last 168 hours. Cardiac Enzymes: No results for input(s): CKTOTAL, CKMB, CKMBINDEX, TROPONINI in the last 168 hours. BNP (last 3 results) No results for input(s): PROBNP in the last 8760 hours. HbA1C: No results for input(s): HGBA1C in the last 72 hours. CBG: No results for input(s): GLUCAP in the last 168 hours. Lipid Profile: No results for input(s): CHOL, HDL, LDLCALC, TRIG, CHOLHDL, LDLDIRECT in the last 72 hours. Thyroid Function Tests: No results for input(s): TSH, T4TOTAL, FREET4, T3FREE, THYROIDAB in the last 72 hours. Anemia Panel: No results for input(s): VITAMINB12, FOLATE, FERRITIN, TIBC, IRON, RETICCTPCT in the last 72 hours. Urine analysis:    Component Value Date/Time   COLORURINE YELLOW 07/16/2007 0900   APPEARANCEUR CLEAR 07/16/2007 0900   LABSPEC 1.020 07/16/2007 0900   PHURINE 6.0 07/16/2007 0900   GLUCOSEU >1000 (A) 07/16/2007 0900   HGBUR NEGATIVE 07/16/2007 0900   BILIRUBINUR NEGATIVE 07/16/2007 0900   KETONESUR NEGATIVE 07/16/2007 0900   PROTEINUR 30 (A) 07/16/2007 0900   UROBILINOGEN 0.2 07/16/2007 0900   NITRITE NEGATIVE 07/16/2007 0900   LEUKOCYTESUR NEGATIVE 07/16/2007 0900   Sepsis Labs: @LABRCNTIP (procalcitonin:4,lacticidven:4) )No results found for this or any previous visit (from the past 240 hour(s)).   Radiological Exams on Admission: DG Chest 2 View  Result Date: 03/25/2020 CLINICAL DATA:  Shortness of breath. EXAM: CHEST - 2 VIEW COMPARISON:  06/15/2017. FINDINGS: Cardiomegaly with bilateral interstitial prominence. Small right pleural effusion cannot be excluded. Findings suggest CHF. Pneumonitis cannot be excluded. Biapical pleural thickening again noted consistent scarring. No pneumothorax. Degenerative change thoracic spine. IMPRESSION:  Cardiomegaly with mild bilateral interstitial prominence. Small right pleural effusion cannot be excluded. Findings suggest CHF. Electronically Signed   By: [place]   On: 03/25/2020 12:15   DG Chest Right Decubitus  Result Date: 03/25/2020 CLINICAL DATA:  Dyspnea on exertion for 2 weeks. EXAM: CHEST - RIGHT DECUBITUS COMPARISON:  Chest x-ray 03/25/2020 FINDINGS: Examination limited by body habitus but findings suspicious for a small layering right pleural effusion. IMPRESSION: Suspect small layering right pleural effusion. Electronically Signed   By: Marijo Sanes M.D.   On: 03/25/2020 12:17    EKG: Independently  reviewed. Sinus rhythm with sinus arrhythmia.   Assessment/Plan   1. Acute on chronic diastolic CHF   - Presents with 2 weeks of progressive bilateral leg swelling, DOE, and chest discomfort despite taking Lasix 80 mg daily at home  - BNP is elevated to 665 and CXR findings consistent with CHF  - Echo from 2017 with preserved EF, grade 1 diastolic dysfunction, mild LVH, and mild LAE  - Potassium is critically-low and patient not hypoxic or in any respiratory distress, so plan to hold diuresis until electrolytes improved, SLIV, fluid-restrict, follow weights and I/Os, continue Coreg, hold ARB given increased SCr with uncertain baseline, and update echocardiogram   2. Hypokalemia  - Serum potassium is 2.6 in ED despite taking supplemental potassium at home  - 40 mEq IV potassium was ordered from ED and another 40 mEq oral potassium will be given  - Mag level pending  - Continue cardiac monitoring, repeat chemistries after supplementation   3. Chest pain  - Patient reports 2 weeks of chest discomfort in setting of acute CHF  - Troponin is 28 in ED, CXR findings consistent with CHF, no acute ischemic features noted on EKG  - Continue cardiac monitoring, check 2nd troponin and echo, and continue statin and beta-blocker    4. Gout  - Patient reports several days of left hand  pain and swelling without fever and similar to prior attacks involving the hands   - Treated with colchicine    5. Insulin-dependent DM  - No A1c available  - Continue CBGs and insulin    6. Chronic kidney disease  - SCr is 2.24 in ED, up from 1.6 in 2018 with no more recent labs available for review  - Renally-dose medications, hold ARB given uncertain baseline, monitor    7. Hypothyroidism  - Continue Synthroid   8. Hypertension  - Continue Coreg, hold ARB given increased SCr with unknown baseline    DVT prophylaxis: sq heparin  Code Status: Full  Family Communication: wife updated at bedside Disposition Plan:  Patient is from: Home  Anticipated d/c is to: home  Anticipated d/c date is: 03/26/20 Patient currently: Pending improvement in electrolytes, echocardiogram  Consults called: None  Admission status: Observation     Vianne Bulls, MD [facility] Pager: See [email or web address]  If 7AM-7PM, please contact the daytime attending [email or web address]  03/25/2020, 9:23 PM

## 2020-03-25 NOTE — ED Triage Notes (Signed)
Patient arrives to ED from his PCP with complaints of SOB and Chest pain for the last two weeks. CP has been centralized with complaints of tightness. Patient endorses new onset dyspnea on exertion, now has trouble walking up steps.

## 2020-03-25 NOTE — ED Notes (Signed)
Two unsuccessful IV attempts.

## 2020-03-26 ENCOUNTER — Observation Stay (HOSPITAL_COMMUNITY): Payer: 59

## 2020-03-26 DIAGNOSIS — E876 Hypokalemia: Secondary | ICD-10-CM | POA: Diagnosis present

## 2020-03-26 DIAGNOSIS — Z823 Family history of stroke: Secondary | ICD-10-CM | POA: Diagnosis not present

## 2020-03-26 DIAGNOSIS — E269 Hyperaldosteronism, unspecified: Secondary | ICD-10-CM | POA: Diagnosis present

## 2020-03-26 DIAGNOSIS — I5041 Acute combined systolic (congestive) and diastolic (congestive) heart failure: Secondary | ICD-10-CM

## 2020-03-26 DIAGNOSIS — M5416 Radiculopathy, lumbar region: Secondary | ICD-10-CM | POA: Diagnosis present

## 2020-03-26 DIAGNOSIS — R0789 Other chest pain: Secondary | ICD-10-CM | POA: Diagnosis present

## 2020-03-26 DIAGNOSIS — H409 Unspecified glaucoma: Secondary | ICD-10-CM | POA: Diagnosis present

## 2020-03-26 DIAGNOSIS — Z8249 Family history of ischemic heart disease and other diseases of the circulatory system: Secondary | ICD-10-CM | POA: Diagnosis not present

## 2020-03-26 DIAGNOSIS — E1122 Type 2 diabetes mellitus with diabetic chronic kidney disease: Secondary | ICD-10-CM | POA: Diagnosis present

## 2020-03-26 DIAGNOSIS — I5021 Acute systolic (congestive) heart failure: Secondary | ICD-10-CM | POA: Diagnosis present

## 2020-03-26 DIAGNOSIS — Z20822 Contact with and (suspected) exposure to covid-19: Secondary | ICD-10-CM | POA: Diagnosis present

## 2020-03-26 DIAGNOSIS — I5033 Acute on chronic diastolic (congestive) heart failure: Secondary | ICD-10-CM | POA: Diagnosis not present

## 2020-03-26 DIAGNOSIS — Z8546 Personal history of malignant neoplasm of prostate: Secondary | ICD-10-CM | POA: Diagnosis not present

## 2020-03-26 DIAGNOSIS — Z794 Long term (current) use of insulin: Secondary | ICD-10-CM | POA: Diagnosis not present

## 2020-03-26 DIAGNOSIS — N183 Chronic kidney disease, stage 3 unspecified: Secondary | ICD-10-CM | POA: Diagnosis present

## 2020-03-26 DIAGNOSIS — E785 Hyperlipidemia, unspecified: Secondary | ICD-10-CM | POA: Diagnosis present

## 2020-03-26 DIAGNOSIS — E1142 Type 2 diabetes mellitus with diabetic polyneuropathy: Secondary | ICD-10-CM | POA: Diagnosis present

## 2020-03-26 DIAGNOSIS — Z833 Family history of diabetes mellitus: Secondary | ICD-10-CM | POA: Diagnosis not present

## 2020-03-26 DIAGNOSIS — I5043 Acute on chronic combined systolic (congestive) and diastolic (congestive) heart failure: Secondary | ICD-10-CM | POA: Diagnosis present

## 2020-03-26 DIAGNOSIS — Z7982 Long term (current) use of aspirin: Secondary | ICD-10-CM | POA: Diagnosis not present

## 2020-03-26 DIAGNOSIS — Z6841 Body Mass Index (BMI) 40.0 and over, adult: Secondary | ICD-10-CM | POA: Diagnosis not present

## 2020-03-26 DIAGNOSIS — E7800 Pure hypercholesterolemia, unspecified: Secondary | ICD-10-CM | POA: Diagnosis present

## 2020-03-26 DIAGNOSIS — Z825 Family history of asthma and other chronic lower respiratory diseases: Secondary | ICD-10-CM | POA: Diagnosis not present

## 2020-03-26 DIAGNOSIS — E669 Obesity, unspecified: Secondary | ICD-10-CM | POA: Diagnosis present

## 2020-03-26 DIAGNOSIS — M109 Gout, unspecified: Secondary | ICD-10-CM | POA: Diagnosis present

## 2020-03-26 DIAGNOSIS — E039 Hypothyroidism, unspecified: Secondary | ICD-10-CM | POA: Diagnosis present

## 2020-03-26 DIAGNOSIS — G4733 Obstructive sleep apnea (adult) (pediatric): Secondary | ICD-10-CM | POA: Diagnosis present

## 2020-03-26 DIAGNOSIS — I130 Hypertensive heart and chronic kidney disease with heart failure and stage 1 through stage 4 chronic kidney disease, or unspecified chronic kidney disease: Secondary | ICD-10-CM | POA: Diagnosis present

## 2020-03-26 LAB — GLUCOSE, CAPILLARY
Glucose-Capillary: 175 mg/dL — ABNORMAL HIGH (ref 70–99)
Glucose-Capillary: 202 mg/dL — ABNORMAL HIGH (ref 70–99)
Glucose-Capillary: 261 mg/dL — ABNORMAL HIGH (ref 70–99)
Glucose-Capillary: 290 mg/dL — ABNORMAL HIGH (ref 70–99)
Glucose-Capillary: 311 mg/dL — ABNORMAL HIGH (ref 70–99)

## 2020-03-26 LAB — BASIC METABOLIC PANEL
Anion gap: 10 (ref 5–15)
Anion gap: 9 (ref 5–15)
Anion gap: 8 (ref 5–15)
BUN: 21 mg/dL (ref 8–23)
BUN: 22 mg/dL (ref 8–23)
BUN: 22 mg/dL (ref 8–23)
CO2: 26 mmol/L (ref 22–32)
CO2: 26 mmol/L (ref 22–32)
CO2: 27 mmol/L (ref 22–32)
Calcium: 8.2 mg/dL — ABNORMAL LOW (ref 8.9–10.3)
Calcium: 8.5 mg/dL — ABNORMAL LOW (ref 8.9–10.3)
Calcium: 8.6 mg/dL — ABNORMAL LOW (ref 8.9–10.3)
Chloride: 102 mmol/L (ref 98–111)
Chloride: 102 mmol/L (ref 98–111)
Chloride: 103 mmol/L (ref 98–111)
Creatinine, Ser: 2.2 mg/dL — ABNORMAL HIGH (ref 0.61–1.24)
Creatinine, Ser: 2.11 mg/dL — ABNORMAL HIGH (ref 0.61–1.24)
Creatinine, Ser: 2.27 mg/dL — ABNORMAL HIGH (ref 0.61–1.24)
GFR calc Af Amer: 35 mL/min — ABNORMAL LOW (ref >60)
GFR calc Af Amer: 37 mL/min — ABNORMAL LOW (ref >60)
GFR calc Af Amer: 34 mL/min — ABNORMAL LOW (ref >60)
GFR calc non Af Amer: 31 mL/min — ABNORMAL LOW (ref >60)
GFR calc non Af Amer: 32 mL/min — ABNORMAL LOW (ref >60)
GFR calc non Af Amer: 29 mL/min — ABNORMAL LOW (ref >60)
Glucose, Bld: 221 mg/dL — ABNORMAL HIGH (ref 70–99)
Glucose, Bld: 342 mg/dL — ABNORMAL HIGH (ref 70–99)
Glucose, Bld: 356 mg/dL — ABNORMAL HIGH (ref 70–99)
Potassium: 2.8 mmol/L — ABNORMAL LOW (ref 3.5–5.1)
Potassium: 4 mmol/L (ref 3.5–5.1)
Potassium: 3.8 mmol/L (ref 3.5–5.1)
Sodium: 138 mmol/L (ref 135–145)
Sodium: 137 mmol/L (ref 135–145)
Sodium: 138 mmol/L (ref 135–145)

## 2020-03-26 LAB — CBC WITH DIFFERENTIAL/PLATELET
Abs Immature Granulocytes: 0.07 10*3/uL (ref 0.00–0.07)
Basophils Absolute: 0.1 10*3/uL (ref 0.0–0.1)
Basophils Relative: 0 %
Eosinophils Absolute: 0.8 10*3/uL — ABNORMAL HIGH (ref 0.0–0.5)
Eosinophils Relative: 6 %
HCT: 31.3 % — ABNORMAL LOW (ref 39.0–52.0)
Hemoglobin: 10.6 g/dL — ABNORMAL LOW (ref 13.0–17.0)
Immature Granulocytes: 1 %
Lymphocytes Relative: 11 %
Lymphs Abs: 1.7 10*3/uL (ref 0.7–4.0)
MCH: 28.3 pg (ref 26.0–34.0)
MCHC: 33.9 g/dL (ref 30.0–36.0)
MCV: 83.7 fL (ref 80.0–100.0)
Monocytes Absolute: 1.2 10*3/uL — ABNORMAL HIGH (ref 0.1–1.0)
Monocytes Relative: 8 %
Neutro Abs: 11.2 10*3/uL — ABNORMAL HIGH (ref 1.7–7.7)
Neutrophils Relative %: 74 %
Platelets: 338 10*3/uL (ref 150–400)
RBC: 3.74 MIL/uL — ABNORMAL LOW (ref 4.22–5.81)
RDW: 13.5 % (ref 11.5–15.5)
WBC: 15 10*3/uL — ABNORMAL HIGH (ref 4.0–10.5)
nRBC: 0 % (ref 0.0–0.2)

## 2020-03-26 LAB — HEMOGLOBIN A1C
Hgb A1c MFr Bld: 8.4 % — ABNORMAL HIGH (ref 4.8–5.6)
Mean Plasma Glucose: 194.38 mg/dL

## 2020-03-26 LAB — ECHOCARDIOGRAM COMPLETE
Height: 74 in
Weight: 5166.4 oz

## 2020-03-26 LAB — HIV ANTIBODY (ROUTINE TESTING W REFLEX): HIV Screen 4th Generation wRfx: NONREACTIVE

## 2020-03-26 LAB — MAGNESIUM: Magnesium: 1.8 mg/dL (ref 1.7–2.4)

## 2020-03-26 LAB — SARS CORONAVIRUS 2 (TAT 6-24 HRS): SARS Coronavirus 2: NEGATIVE

## 2020-03-26 MED ORDER — POTASSIUM CHLORIDE 20 MEQ/15ML (10%) PO SOLN
40.0000 meq | ORAL | Status: AC
  Administered 2020-03-26 (×3): 40 meq via ORAL
  Filled 2020-03-26 (×3): qty 30

## 2020-03-26 MED ORDER — FUROSEMIDE 10 MG/ML IJ SOLN
80.0000 mg | Freq: Four times a day (QID) | INTRAMUSCULAR | Status: DC
  Administered 2020-03-26 – 2020-03-27 (×4): 80 mg via INTRAVENOUS
  Filled 2020-03-26 (×4): qty 8

## 2020-03-26 MED ORDER — PERFLUTREN LIPID MICROSPHERE
1.0000 mL | INTRAVENOUS | Status: AC | PRN
  Administered 2020-03-26: 10:00:00 2 mL via INTRAVENOUS
  Filled 2020-03-26: qty 10

## 2020-03-26 NOTE — Progress Notes (Addendum)
PROGRESS NOTE    Brendan [name]  DXI:[id number] DOB: 1956/06/20 DOA: 03/25/2020 PCP: Seward Carol, MD   Brief Narrative:  Brendan [name] is a 64 y.o. male with medical history significant for chronic kidney disease, OSA, hypertension, insulin-dependent diabetes mellitus, hypothyroidism, chronic diastolic CHF, and gout, presenting to the emergency department for evaluation of bilateral lower extremity edema, exertional dyspnea, and chest discomfort.  Patient reports approximately 2 weeks of chest discomfort and shortness of breath with progressive bilateral leg swelling.  He has been taking 80 mg of Lasix with potassium supplement at home, but has not noticed much of any improvement.  Chest discomfort is mild, localized to the central chest, associated with shortness of breath, but no nausea or diaphoresis.  Denies any cough, fevers, chills, or sick contacts.  He has been unable to lay flat recently but reports that this is due to vertigo more than dyspnea.  He has had several days of left hand pain, swelling, and redness similar to gout attacks that he has experienced in the contralateral hand.  There is no fevers, chills, wound, or drainage associated with this. Upon arrival to the ED, patient is found to be afebrile, saturating well on room air, and hypertensive to 180/80.  EKG features sinus rhythm with sinus arrhythmia.  Chest x-ray notable for cardiomegaly, interstitial prominence bilaterally, and suspected small pleural effusion, suspicious for CHF.  Chemistry panel notable for potassium 2.6 and creatinine 2.24, up from 1.6 in 2018.  CBC with leukocytosis that appears to be chronic and a slight normocytic anemia.  Troponin is elevated to 28 and BNP elevated to 665.  40 mEq of IV potassium, COVID-19 screening test, and magnesium levels were ordered from the ED and hospitalist asked to admit.  **Afternoon update - K WNL on recheck, initiate aggressive IV lasix - patient on 80po daily at home - 80iv q6h x4  doses today/overnight. Condom cath for accurate I/Os - repeat BMP pending q4h to follow K. If less than 3.0 overnight would hold further doses of lasix until K can be repleted.  Assessment & Plan:   Principal Problem:   Acute on chronic diastolic CHF (congestive heart failure) (HCC) Active Problems:   Insulin-requiring or dependent type II diabetes mellitus (HCC)   Obstructive sleep apnea   Hypertension   Hypokalemia   Leukocytosis   Renal insufficiency   Chest pain   Hypothyroidism   Gout attack  Acute on chronic diastolic CHF, POA ongoing   - Worsening edema/SHOB/DOE/orthopnea despite Lasix 80 mg daily at home  - Echo from 2017 with preserved EF, grade 1 diastolic dysfunction, mild LVH, and mild LAE : Repeat echo pending - Potassium remains critically low, unable to initiate Lasix still despite potassium repletion overnight -see below -Continue strict I's and O's, core measures including beta-blocker -hold ACE/ARB given below  Hypokalemia, severe, questionably acute  - Likely secondary to aggressive diuresis in the outpatient setting with Lasix 80  - Potassium 2.6 at admission, 2.8 this morning on recheck despite an additional 80 mEq over the night -Additional 120 mEq given p.o. today - Mag level within normal limits  - Repeat BMP every 4 hours today pending -Patient may benefit from spironolactone in the outpatient setting if this appears to be a chronic issue  Chest pain  - Patient reports 2 weeks of chest discomfort in setting of acute CHF  - Troponin is 28 in ED, CXR findings consistent with CHF, no acute ischemic features noted on EKG  - Continue cardiac monitoring,  check 2nd troponin and echo, and continue statin and beta-blocker    Gout, without acute flare  - Patient reports several days of left hand pain and swelling without fever and similar to prior attacks involving the hands   -Continue colchicine    Insulin-dependent DM, uncontrolled Lab Results   Component Value Date   HGBA1C 8.4 (H) 03/26/2020  - Continue point-of-care checks, sliding scale insulin, hypoglycemic protocol  Chronic kidney disease, questionably 3B, POA -Unknown recent baseline, patient was CKD 3 a/b over a year ago -Possibly new baseline versus AKI on CKD Lab Results  Component Value Date   CREATININE 2.20 (H) 03/26/2020   CREATININE 2.24 (H) 03/25/2020   CREATININE 1.59 (H) 06/15/2017    Hypothyroidism  - Continue Synthroid   Hypertension  - Continue Coreg, hold ARB given increased SCr with unknown baseline   DVT prophylaxis: sq heparin  Code Status: Full  Family Communication: wife updated at bedside  Status is: Inpatient  Dispo: The patient is from: Home              Anticipated d/c is to: Home              Anticipated d/c date is: 48 to 72 hours              Patient currently not medically stable for discharge, continues to require close monitoring, telemetry in the setting of severe hypokalemia, volume overload in need of IV diuretics  Subjective: No acute issues or events overnight,somewhat still edematous and short of breath but no worse than admission.  Denies nausea, vomiting, diarrhea, constipation, headache, fevers, chills.  Objective: Vitals:   03/25/20 2205 03/25/20 2206 03/25/20 2208 03/26/20 0313  BP: (!) 181/98 (!) 181/98  (!) 183/94  Pulse: 66 65  64  Resp:  18 19 20   Temp:  98.7 F (37.1 C)  98.3 F (36.8 C)  TempSrc:  Oral  Oral  SpO2: 100% 98%  98%  Weight:    (!) 146.5 kg  Height:        Intake/Output Summary (Last 24 hours) at 03/26/2020 0740 Last data filed at 03/26/2020 0313 Gross per 24 hour  Intake -  Output 300 ml  Net -300 ml   Filed Weights   03/25/20 1618 03/26/20 0313  Weight: (!) 147.4 kg (!) 146.5 kg    Examination:  General:  Pleasantly resting in bed, No acute distress. HEENT:  Normocephalic atraumatic.  Sclerae nonicteric, noninjected.  Extraocular movements intact bilaterally. Neck:  Without  mass or deformity.  Trachea is midline. Lungs:  Clear to auscultate bilaterally without rhonchi, wheeze, or rales. Heart:  Regular rate and rhythm.  Without murmurs, rubs, or gallops. Abdomen:  Soft, nontender, nondistended.  Without guarding or rebound. Extremities: Without cyanosis, clubbing, 3+ edema, or obvious deformity. Vascular:  Dorsalis pedis and posterior tibial pulses palpable bilaterally. Skin:  Warm and dry, no erythema, no ulcerations.  Data Reviewed: I have personally reviewed following labs and imaging studies  CBC: Recent Labs  Lab 03/25/20 1805 03/26/20 0637  WBC 14.8* 15.0*  NEUTROABS  --  11.2*  HGB 11.2* 10.6*  HCT 34.9* 31.3*  MCV 85.1 83.7  PLT 383 301   Basic Metabolic Panel: Recent Labs  Lab 03/25/20 1805 03/25/20 2155  NA 139  --   K 2.6*  --   CL 99  --   CO2 27  --   GLUCOSE 114*  --   BUN 19  --   CREATININE 2.24*  --  CALCIUM 8.4*  --   MG  --  1.5*   GFR: Estimated Creatinine Clearance: 50.8 mL/min (A) (by C-G formula based on SCr of 2.24 mg/dL (H)). Liver Function Tests: No results for input(s): AST, ALT, ALKPHOS, BILITOT, PROT, ALBUMIN in the last 168 hours. No results for input(s): LIPASE, AMYLASE in the last 168 hours. No results for input(s): AMMONIA in the last 168 hours. Coagulation Profile: No results for input(s): INR, PROTIME in the last 168 hours. Cardiac Enzymes: No results for input(s): CKTOTAL, CKMB, CKMBINDEX, TROPONINI in the last 168 hours. BNP (last 3 results) No results for input(s): PROBNP in the last 8760 hours. HbA1C: No results for input(s): HGBA1C in the last 72 hours. CBG: Recent Labs  Lab 03/25/20 2338 03/26/20 0312  GLUCAP 66* 175*   Lipid Profile: No results for input(s): CHOL, HDL, LDLCALC, TRIG, CHOLHDL, LDLDIRECT in the last 72 hours. Thyroid Function Tests: No results for input(s): TSH, T4TOTAL, FREET4, T3FREE, THYROIDAB in the last 72 hours. Anemia Panel: No results for input(s):  VITAMINB12, FOLATE, FERRITIN, TIBC, IRON, RETICCTPCT in the last 72 hours. Sepsis Labs: No results for input(s): PROCALCITON, LATICACIDVEN in the last 168 hours.  Recent Results (from the past 240 hour(s))  SARS CORONAVIRUS 2 (TAT 6-24 HRS) Nasopharyngeal Nasopharyngeal Swab     Status: None   Collection Time: 03/25/20  9:01 PM   Specimen: Nasopharyngeal Swab  Result Value Ref Range Status   SARS Coronavirus 2 NEGATIVE NEGATIVE Final    Comment: (NOTE) SARS-CoV-2 target nucleic acids are NOT DETECTED. The SARS-CoV-2 RNA is generally detectable in upper and lower respiratory specimens during the acute phase of infection. Negative results do not preclude SARS-CoV-2 infection, do not rule out co-infections with other pathogens, and should not be used as the sole basis for treatment or other patient management decisions. Negative results must be combined with clinical observations, patient history, and epidemiological information. The expected result is Negative. Fact Sheet for Patients: SugarRoll.be Fact Sheet for Healthcare Providers: https://www.woods-mathews.com/ This test is not yet approved or cleared by the [place] FDA and  has been authorized for detection and/or diagnosis of SARS-CoV-2 by FDA under an Emergency Use Authorization (EUA). This EUA will remain  in effect (meaning this test can be used) for the duration of the COVID-19 declaration under Section 56 4(b)(1) of the Act, 21 U.S.C. section 360bbb-3(b)(1), unless the authorization is terminated or revoked sooner. Performed at [facility], [place] [address], [place], [place] [address]          Radiology Studies: DG Chest 2 View  Result Date: 03/25/2020 CLINICAL DATA:  Shortness of breath. EXAM: CHEST - 2 VIEW COMPARISON:  06/15/2017. FINDINGS: Cardiomegaly with bilateral interstitial prominence. Small right pleural effusion cannot be excluded. Findings suggest CHF.  Pneumonitis cannot be excluded. Biapical pleural thickening again noted consistent scarring. No pneumothorax. Degenerative change thoracic spine. IMPRESSION: Cardiomegaly with mild bilateral interstitial prominence. Small right pleural effusion cannot be excluded. Findings suggest CHF. Electronically Signed   By: [place]   On: 03/25/2020 12:15   DG Chest Right Decubitus  Result Date: 03/25/2020 CLINICAL DATA:  Dyspnea on exertion for 2 weeks. EXAM: CHEST - RIGHT DECUBITUS COMPARISON:  Chest x-ray 03/25/2020 FINDINGS: Examination limited by body habitus but findings suspicious for a small layering right pleural effusion. IMPRESSION: Suspect small layering right pleural effusion. Electronically Signed   By: Marijo Sanes M.D.   On: 03/25/2020 12:17        Scheduled Meds: .  timolol  1 drop Both Eyes BID   And  . brimonidine  1 drop Both Eyes BID  . carvedilol  25 mg Oral BID WC  . heparin  5,000 Units Subcutaneous Q8H  . insulin aspart  0-9 Units Subcutaneous TID WC  . insulin glargine  10 Units Subcutaneous QHS  . levothyroxine  175 mcg Oral QAC breakfast  . potassium chloride SA  20 mEq Oral BID  . simvastatin  20 mg Oral QPM  . sodium chloride flush  3 mL Intravenous Once  . sodium chloride flush  3 mL Intravenous Q12H   Continuous Infusions: . sodium chloride       LOS: 0 days   Time spent: 69min  Wilhelm Ganaway C Zekiah Caruth, DO [facility]  If 7PM-7AM, please contact night-coverage [email or web address]  03/26/2020, 7:40 AM

## 2020-03-26 NOTE — Progress Notes (Signed)
Auto CPAP (Min 6-Max 18) set up for pt with nasal mask. Pt states he can place mask on himself when ready for bed. Advised pt to notify for RT if any further assistance is needed.

## 2020-03-26 NOTE — Progress Notes (Signed)
  Echocardiogram 2D Echocardiogram has been performed.  Brendan [name] 03/26/2020, 10:37 AM

## 2020-03-27 LAB — BASIC METABOLIC PANEL
Anion gap: 12 (ref 5–15)
BUN: 24 mg/dL — ABNORMAL HIGH (ref 8–23)
CO2: 24 mmol/L (ref 22–32)
Calcium: 8.5 mg/dL — ABNORMAL LOW (ref 8.9–10.3)
Chloride: 104 mmol/L (ref 98–111)
Creatinine, Ser: 2.19 mg/dL — ABNORMAL HIGH (ref 0.61–1.24)
GFR calc Af Amer: 36 mL/min — ABNORMAL LOW (ref >60)
GFR calc non Af Amer: 31 mL/min — ABNORMAL LOW (ref >60)
Glucose, Bld: 372 mg/dL — ABNORMAL HIGH (ref 70–99)
Potassium: 3.3 mmol/L — ABNORMAL LOW (ref 3.5–5.1)
Sodium: 140 mmol/L (ref 135–145)

## 2020-03-27 LAB — CBC
HCT: 32.3 % — ABNORMAL LOW (ref 39.0–52.0)
Hemoglobin: 10.6 g/dL — ABNORMAL LOW (ref 13.0–17.0)
MCH: 27.5 pg (ref 26.0–34.0)
MCHC: 32.8 g/dL (ref 30.0–36.0)
MCV: 83.9 fL (ref 80.0–100.0)
Platelets: 356 10*3/uL (ref 150–400)
RBC: 3.85 MIL/uL — ABNORMAL LOW (ref 4.22–5.81)
RDW: 13.4 % (ref 11.5–15.5)
WBC: 10.5 10*3/uL (ref 4.0–10.5)
nRBC: 0 % (ref 0.0–0.2)

## 2020-03-27 LAB — GLUCOSE, CAPILLARY
Glucose-Capillary: 373 mg/dL — ABNORMAL HIGH (ref 70–99)
Glucose-Capillary: 560 mg/dL (ref 70–99)
Glucose-Capillary: 506 mg/dL (ref 70–99)
Glucose-Capillary: 426 mg/dL — ABNORMAL HIGH (ref 70–99)

## 2020-03-27 MED ORDER — TORSEMIDE 20 MG PO TABS: 40.0000 mg | ORAL_TABLET | Freq: Two times a day (BID) | ORAL | 0 refills | Status: DC

## 2020-03-27 MED ORDER — INSULIN ASPART 100 UNIT/ML ~~LOC~~ SOLN
20.0000 [IU] | Freq: Once | SUBCUTANEOUS | Status: AC
  Administered 2020-03-27: 20 [IU] via SUBCUTANEOUS

## 2020-03-27 MED ORDER — POTASSIUM CHLORIDE CRYS ER 20 MEQ PO TBCR
40.0000 meq | EXTENDED_RELEASE_TABLET | Freq: Once | ORAL | Status: AC
  Administered 2020-03-27: 12:00:00 40 meq via ORAL
  Filled 2020-03-27: qty 2

## 2020-03-27 MED ORDER — INSULIN ASPART 100 UNIT/ML ~~LOC~~ SOLN
20.0000 [IU] | Freq: Once | SUBCUTANEOUS | Status: AC
  Administered 2020-03-27: 14:00:00 20 [IU] via SUBCUTANEOUS

## 2020-03-27 MED ORDER — POTASSIUM CHLORIDE 20 MEQ/15ML (10%) PO SOLN: 40.0000 meq | ORAL | Status: DC

## 2020-03-27 MED ORDER — POTASSIUM CHLORIDE ER 20 MEQ PO TBCR: 20.0000 meq | EXTENDED_RELEASE_TABLET | Freq: Three times a day (TID) | ORAL | 0 refills | Status: DC

## 2020-03-27 NOTE — Progress Notes (Signed)
Inpatient Diabetes Program Recommendations  AACE/ADA: New Consensus Statement on Inpatient Glycemic Control (2015)  Target Ranges:  Prepandial:   less than 140 mg/dL      Peak postprandial:   less than 180 mg/dL (1-2 hours)      Critically ill patients:  140 - 180 mg/dL   Lab Results  Component Value Date   GLUCAP 506 ([place]) 03/27/2020   HGBA1C 8.4 (H) 03/26/2020    Review of Glycemic Control Results for MCKENNON, ZWART (MRN [id number]) as of 03/27/2020 14:16  Ref. Range 03/26/2020 11:29 03/26/2020 16:45 03/26/2020 20:57 03/27/2020 07:55 03/27/2020 11:22 03/27/2020 13:19  Glucose-Capillary Latest Ref Range: 70 - 99 mg/dL 261 (H) 290 (H) 311 (H) 373 (H) 560 (HH) 506 (HH)   Diabetes history: DM 2 Outpatient Diabetes medications:  Humalog 75/25 60-100 units in the AM and 40 units in the evening Current orders for Inpatient glycemic control:  Novolog sensitive tid with meals Lantus 10 units q HS  Inpatient Diabetes Program Recommendations:    Please note that patient was on significant doses of 75/25 prior to admit.  Please restart 70/30 50 units bid (needs first dose this evening with dinner).   If 70/30 restarted, d/c Lantus.  Thanks Adah Perl, RN, BC-ADM Inpatient Diabetes Coordinator Pager [phone] (8a-5p)

## 2020-03-27 NOTE — Discharge Summary (Signed)
Physician Discharge Summary  [name] HQP:[id number] DOB: 08-04-1956 DOA: 04/25/2020  PCP: Seward Carol, MD  Admit date: 03/25/2020 Discharge date: 03/27/2020  Admitted From: Home Disposition: Home  Recommendations for Outpatient Follow-up:  1. Follow up with PCP in 1-2 weeks, heart failure clinic in 1 to 2 weeks 2. Please obtain BMP/CBC in one week 3. Please follow up on the following pending results:  Discharge Condition: Stable CODE STATUS: Full Diet recommendation: Diabetic diet, fluid restricted to 2 L, low salt less than 2 g diet  Brief/Interim Summary: [name] a 64 y.o.malewith medical history significant forchronic kidney disease, OSA, hypertension, insulin-dependent diabetes mellitus, hypothyroidism, chronic diastolic CHF, and gout, presenting to the emergency department for evaluation of bilateral lower extremity edema, exertional dyspnea, and chest discomfort. Patient reports approximately 2 weeks of chest discomfort and shortness of breath with progressive bilateral leg swelling. He has been taking 80 mg of Lasix with potassium supplement at home, but has not noticed much of any improvement. Chest discomfort is mild, localized to the central chest, associated with shortness of breath, but no nausea or diaphoresis. Denies any cough, fevers, chills,or sick contacts. He has been unable to lay flat recently but reports that this is due to vertigo more than dyspnea. He has had several days of left hand pain, swelling, and redness similar to gout attacks that he has experienced in the contralateral hand. There is no fevers, chills, wound, or drainage associated with this. Upon arrival to the ED, patient is found to be afebrile, saturating well on room air, and hypertensive to 180/80. EKG features sinus rhythm with sinus arrhythmia. Chest x-ray notable for cardiomegaly, interstitial prominence bilaterally, and suspected small pleural effusion, suspicious for CHF. Chemistry  panel notable for potassium 2.6 and creatinine 2.24, up from 1.6 in 2018. CBC with leukocytosis that appears to be chronic and a slight normocytic anemia. Troponin is elevated to 28 and BNP elevated to 665. 40 mEq of IV potassium, COVID-19 screening test, and magnesium levels were ordered from the ED and hospitalist asked to admit.  Patient admitted as above with acute volume overload in the setting of heart failure exacerbation.  Unfortunately patient also had remarkably low potassium limiting diuresis.  Patient's potassium resolved late yesterday afternoon, patient received 80 IV Lasix multiple times throughout the night and this morning with remarkable urine output, down nearly 20 pounds in the last 24 hours.  Patient appears to be slightly volume overloaded but markedly improving, patient's creatinine remains stable near what appears to be his baseline CKD 3 a versus B, unspecified baseline, despite aggressive diuresis.  We had lengthy discussion about need for medication compliance, dietary compliance, patient will be transitioned to p.o. torsemide from furosemide as well as will have minimally increased potassium at home given remarkable hypokalemia at intake.  He has been requested to discharge and follow-up with heart failure clinic given his poor dietary and health regimen, patient was not fully aware of salt restriction fluid restriction or dietary restrictions with diagnosis of heart failure likely he was volume overloaded.  Patient has poorly controlled diabetes as well and A1c of 8.4, we discussed need for tight dietary compliance in the setting of diabetes as well.  At this time patient appears quite well, otherwise stable and agreeable for discharge home.  Discharge Diagnoses:  Principal Problem:   Acute on chronic diastolic CHF (congestive heart failure) (HCC) Active Problems:   Insulin-requiring or dependent type II diabetes mellitus ([place])   Obstructive sleep apnea   Hypertension  Hypokalemia   Leukocytosis   Renal insufficiency   Chest pain   Hypothyroidism   Gout attack   Heart failure, systolic, acute [facility])    Discharge Instructions  Discharge Instructions    AMB referral to CHF clinic   Complete by: As directed    Call MD for:  difficulty breathing, headache or visual disturbances   Complete by: As directed    Call MD for:  extreme fatigue   Complete by: As directed    Call MD for:  hives   Complete by: As directed    Call MD for:  persistant dizziness or light-headedness   Complete by: As directed    Call MD for:  persistant nausea and vomiting   Complete by: As directed    Call MD for:  redness, tenderness, or signs of infection (pain, swelling, redness, odor or green/yellow discharge around incision site)   Complete by: As directed    Left leg wound   Call MD for:  severe uncontrolled pain   Complete by: As directed    Call MD for:  temperature >100.4   Complete by: As directed    Diet - low sodium heart healthy   Complete by: As directed    Less than 2 g of salt(sodium) per day, less than 2 L of fluids per day Strict low-carb diet   Increase activity slowly   Complete by: As directed      Allergies as of 03/27/2020      Reactions   Ace Inhibitors    REACTION: cough   Maxidex [dexamethasone] Other (See Comments)   Sexual dysfunction   Verapamil    REACTION: constipation      Medication List    STOP taking these medications   furosemide 40 MG tablet Commonly known as: LASIX     TAKE these medications   acetaminophen 500 MG tablet Commonly known as: TYLENOL Take 500 mg by mouth every 6 (six) hours as needed for mild pain.   albuterol 108 (90 Base) MCG/ACT inhaler Commonly known as: VENTOLIN HFA Inhale 2 puffs into the lungs every 4 (four) hours as needed for wheezing or shortness of breath (cough, shortness of breath or wheezing.).   brimonidine-timolol 0.2-0.5 % ophthalmic solution Commonly known as: COMBIGAN Place 1 drop  into both eyes every 12 (twelve) hours.   carvedilol 25 MG tablet Commonly known as: COREG Take 25 mg by mouth 2 (two) times daily.   cetirizine 10 MG tablet Commonly known as: ZYRTEC Take 10 mg by mouth daily as needed for allergies.   glucose blood test strip 1 each by Other route as needed for other. Use as instructed one touch test strips   insulin lispro protamine-lispro (50-50) 100 UNIT/ML Susp injection Commonly known as: HUMALOG 50/50 MIX Inject 40 Units into the skin daily.   HUMALOG MIX 75/25 [place] Inject 60-100 Units into the skin daily. humalog mix 75/25 injection 60-100 units q am sliding scale. Take 60 units if sugar is <180, then take 100 units if sugar is over 400   levothyroxine 175 MCG tablet Commonly known as: SYNTHROID Take 175 mcg by mouth daily before breakfast.   ONE TOUCH ULTRA SYSTEM KIT w/Device Kit 1 kit by Does not apply route once. One touch ultrasoft lancets   onetouch ultrasoft lancets   Potassium Chloride ER 20 MEQ Tbcr Take 20 mEq by mouth 3 (three) times daily. What changed:   medication strength  when to take this   simvastatin 20 MG  tablet Commonly known as: ZOCOR Take 20 mg by mouth every evening.   telmisartan 80 MG tablet Commonly known as: MICARDIS Take 80 mg by mouth in the morning and at bedtime.   torsemide 20 MG tablet Commonly known as: Demadex Take 2 tablets (40 mg total) by mouth 2 (two) times daily.   Trulicity 3 IR/6.7EL Sopn Generic drug: Dulaglutide Inject 3.5 mg into the skin once a week.       Allergies  Allergen Reactions  . Ace Inhibitors     REACTION: cough  . Maxidex [Dexamethasone] Other (See Comments)    Sexual dysfunction  . Verapamil     REACTION: constipation    Consultations: None  Procedures/Studies: DG Chest 2 View  Result Date: 03/25/2020 CLINICAL DATA:  Shortness of breath. EXAM: CHEST - 2 VIEW COMPARISON:  06/15/2017. FINDINGS: Cardiomegaly with bilateral interstitial prominence.  Small right pleural effusion cannot be excluded. Findings suggest CHF. Pneumonitis cannot be excluded. Biapical pleural thickening again noted consistent scarring. No pneumothorax. Degenerative change thoracic spine. IMPRESSION: Cardiomegaly with mild bilateral interstitial prominence. Small right pleural effusion cannot be excluded. Findings suggest CHF. Electronically Signed   By: [place]   On: 03/25/2020 12:15   DG Chest Right Decubitus  Result Date: 03/25/2020 CLINICAL DATA:  Dyspnea on exertion for 2 weeks. EXAM: CHEST - RIGHT DECUBITUS COMPARISON:  Chest x-ray 03/25/2020 FINDINGS: Examination limited by body habitus but findings suspicious for a small layering right pleural effusion. IMPRESSION: Suspect small layering right pleural effusion. Electronically Signed   By: Marijo Sanes M.D.   On: 03/25/2020 12:17   ECHOCARDIOGRAM COMPLETE  Result Date: 03/26/2020    ECHOCARDIOGRAM REPORT   Patient Name:   Brendan [name] Date of Exam: 03/26/2020 Medical Rec #:  [id number]   Height:       74.0 in Accession #:    [id number]  Weight:       322.9 lb Date of Birth:  [birth year]   BSA:          2.664 m Patient Age:    64 years    BP:           162/79 mmHg Patient Gender: M           HR:           64 bpm. Exam Location:  Inpatient Procedure: 2D Echo, Cardiac Doppler, Color Doppler and Intracardiac            Opacification Agent Indications:    I50.40* Unspecified combined systolic (congestive) and diastolic                 (congestive) heart failure  History:        Patient has prior history of Echocardiogram examinations, most                 recent 09/27/2016. CHF, Signs/Symptoms:Chest Pain; Risk                 Factors:Diabetes and Sleep Apnea. Edema. Cancer.  Sonographer:    Roseanna Rainbow RDCS Referring Phys: [id number] TIMOTHY S OPYD  Sonographer Comments: Patient is morbidly obese, Technically difficult study due to poor echo windows, suboptimal subcostal window, suboptimal apical window and suboptimal parasternal  window. Image acquisition challenging due to patient body habitus. IMPRESSIONS  1. Left ventricular ejection fraction, by estimation, is 55 to 60%. The left ventricle has normal function. Left ventricular endocardial border not optimally defined to evaluate regional wall motion. There is moderate left ventricular hypertrophy.  Left ventricular diastolic parameters are consistent with Grade II diastolic dysfunction (pseudonormalization). Elevated left ventricular end-diastolic pressure.  2. Right ventricular systolic function is normal. The right ventricular size is normal. Tricuspid regurgitation signal is inadequate for assessing PA pressure.  3. The mitral valve is abnormal. Trivial mitral valve regurgitation.  4. The aortic valve was not well visualized. Aortic valve regurgitation is not visualized.  5. The inferior vena cava is dilated in size with <50% respiratory variability, suggesting right atrial pressure of 15 mmHg. FINDINGS  Left Ventricle: Left ventricular ejection fraction, by estimation, is 55 to 60%. The left ventricle has normal function. Left ventricular endocardial border not optimally defined to evaluate regional wall motion. Definity contrast agent was given IV to delineate the left ventricular endocardial borders. The left ventricular internal cavity size was normal in size. There is moderate left ventricular hypertrophy. Left ventricular diastolic parameters are consistent with Grade II diastolic dysfunction (pseudonormalization). Elevated left ventricular end-diastolic pressure. Right Ventricle: The right ventricular size is normal. No increase in right ventricular wall thickness. Right ventricular systolic function is normal. Tricuspid regurgitation signal is inadequate for assessing PA pressure. Left Atrium: Left atrial size was normal in size. Right Atrium: Right atrial size was normal in size. Pericardium: There is no evidence of pericardial effusion. Mitral Valve: The mitral valve is  abnormal. There is mild thickening of the mitral valve leaflet(s). Trivial mitral valve regurgitation. Tricuspid Valve: The tricuspid valve is grossly normal. Tricuspid valve regurgitation is trivial. Aortic Valve: The aortic valve was not well visualized. Aortic valve regurgitation is not visualized. Pulmonic Valve: The pulmonic valve was not well visualized. Pulmonic valve regurgitation is not visualized. Aorta: The aortic root and ascending aorta are structurally normal, with no evidence of dilitation. Venous: The inferior vena cava is dilated in size with less than 50% respiratory variability, suggesting right atrial pressure of 15 mmHg. IAS/Shunts: No atrial level shunt detected by color flow Doppler.  LEFT VENTRICLE PLAX 2D LVIDd:         6.00 cm      Diastology LVIDs:         4.60 cm      LV e' lateral:   4.54 cm/s LV PW:         1.40 cm      LV E/e' lateral: 20.9 LV IVS:        1.60 cm      LV e' medial:    4.05 cm/s LVOT diam:     1.90 cm      LV E/e' medial:  23.4 LV SV:         77 LV SV Index:   29 LVOT Area:     2.84 cm  LV Volumes (MOD) LV vol d, MOD A2C: 120.0 ml LV vol d, MOD A4C: 122.0 ml LV vol s, MOD A2C: 62.2 ml LV vol s, MOD A4C: 49.8 ml LV SV MOD A2C:     57.8 ml LV SV MOD A4C:     122.0 ml LV SV MOD BP:      64.2 ml RIGHT VENTRICLE             IVC RV S prime:     11.50 cm/s  IVC diam: 2.50 cm TAPSE (M-mode): 3.0 cm LEFT ATRIUM             Index       RIGHT ATRIUM           Index LA diam:  4.80 cm 1.80 cm/m  RA Area:     16.40 cm LA Vol (A2C):   78.9 ml 29.61 ml/m RA Volume:   40.60 ml  15.24 ml/m LA Vol (A4C):   88.9 ml 33.37 ml/m LA Biplane Vol: 83.4 ml 31.30 ml/m  AORTIC VALVE LVOT Vmax:   122.00 cm/s LVOT Vmean:  87.400 cm/s LVOT VTI:    0.273 m  AORTA Ao Root diam: 3.20 cm Ao Asc diam:  3.10 cm MITRAL VALVE MV Area (PHT): 3.28 cm    SHUNTS MV Decel Time: 231 msec    Systemic VTI:  0.27 m MV E velocity: 94.83 cm/s  Systemic Diam: 1.90 cm MV A velocity: 57.75 cm/s MV E/A ratio:   1.64 Lyman Bishop MD Electronically signed by Lyman Bishop MD Signature Date/Time: 03/26/2020/1:34:27 PM    Final      Subjective: No acute issues or events overnight, patient diuresed quite well denies chest pain, shortness of breath, nausea, vomiting, diarrhea, constipation, headache liters, chills.  Indicates his edema orthopnea and shortness of breath with exertion have markedly improved.   Discharge Exam: Vitals:   03/27/20 0405 03/27/20 1324  BP: (!) 146/68 (!) 182/93  Pulse: 60 64  Resp: 18 20  Temp: 98.7 F (37.1 C) 98.2 F (36.8 C)  SpO2: 99% 99%   Vitals:   03/26/20 2121 03/27/20 0033 03/27/20 0405 03/27/20 1324  BP: (!) 155/86 (!) 162/86 (!) 146/68 (!) 182/93  Pulse: 60  60 64  Resp: 18  18 20   Temp: 98.5 F (36.9 C)  98.7 F (37.1 C) 98.2 F (36.8 C)  TempSrc: Oral  Oral Oral  SpO2: 100%  99% 99%  Weight:   (!) 145 kg   Height:        General: Pt is alert, awake, not in acute distress Cardiovascular: RRR, S1/S2 +, no rubs, no gallops Respiratory: CTA bilaterally, no wheezing, no rhonchi Abdominal: Soft, NT, ND, bowel sounds + Extremities: 1+ bilateral lower extremity edema, no cyanosis    The results of significant diagnostics from this hospitalization (including imaging, microbiology, ancillary and laboratory) are listed below for reference.     Microbiology: Recent Results (from the past 240 hour(s))  SARS CORONAVIRUS 2 (TAT 6-24 HRS) Nasopharyngeal Nasopharyngeal Swab     Status: None   Collection Time: 03/25/20  9:01 PM   Specimen: Nasopharyngeal Swab  Result Value Ref Range Status   SARS Coronavirus 2 NEGATIVE NEGATIVE Final    Comment: (NOTE) SARS-CoV-2 target nucleic acids are NOT DETECTED. The SARS-CoV-2 RNA is generally detectable in upper and lower respiratory specimens during the acute phase of infection. Negative results do not preclude SARS-CoV-2 infection, do not rule out co-infections with other pathogens, and should not be used as  the sole basis for treatment or other patient management decisions. Negative results must be combined with clinical observations, patient history, and epidemiological information. The expected result is Negative. Fact Sheet for Patients: SugarRoll.be Fact Sheet for Healthcare Providers: https://www.woods-mathews.com/ This test is not yet approved or cleared by the [place] FDA and  has been authorized for detection and/or diagnosis of SARS-CoV-2 by FDA under an Emergency Use Authorization (EUA). This EUA will remain  in effect (meaning this test can be used) for the duration of the COVID-19 declaration under Section 56 4(b)(1) of the Act, 21 U.S.C. section 360bbb-3(b)(1), unless the authorization is terminated or revoked sooner. Performed at [facility], [place] [address], [place], [place] [address]  Labs: BNP (last 3 results) Recent Labs    03/25/20 1805  BNP 825.0*   Basic Metabolic Panel: Recent Labs  Lab 03/25/20 1805 03/25/20 2155 03/26/20 0637 03/26/20 1645 03/26/20 2102 03/27/20 0542  NA 139  --  138 137 138 140  K 2.6*  --  2.8* 4.0 3.8 3.3*  CL 99  --  102 102 103 104  CO2 27  --  26 26 27 24   GLUCOSE 114*  --  221* 342* 356* 372*  BUN 19  --  21 22 22  24*  CREATININE 2.24*  --  2.20* 2.11* 2.27* 2.19*  CALCIUM 8.4*  --  8.2* 8.5* 8.6* 8.5*  MG  --  1.5* 1.8  --   --   --    Liver Function Tests: No results for input(s): AST, ALT, ALKPHOS, BILITOT, PROT, ALBUMIN in the last 168 hours. No results for input(s): LIPASE, AMYLASE in the last 168 hours. No results for input(s): AMMONIA in the last 168 hours. CBC: Recent Labs  Lab 03/25/20 1805 03/26/20 0637 03/27/20 0542  WBC 14.8* 15.0* 10.5  NEUTROABS  --  11.2*  --   HGB 11.2* 10.6* 10.6*  HCT 34.9* 31.3* 32.3*  MCV 85.1 83.7 83.9  PLT 383 338 356   Cardiac Enzymes: No results for input(s): CKTOTAL, CKMB, CKMBINDEX, TROPONINI in the last 168  hours. BNP: Invalid input(s): POCBNP CBG: Recent Labs  Lab 03/26/20 2057 03/27/20 0755 03/27/20 1122 03/27/20 1319 03/27/20 1514  GLUCAP 311* 373* 560* 506* 426*   D-Dimer No results for input(s): DDIMER in the last 72 hours. Hgb A1c Recent Labs    03/26/20 0637  HGBA1C 8.4*   Lipid Profile No results for input(s): CHOL, HDL, LDLCALC, TRIG, CHOLHDL, LDLDIRECT in the last 72 hours. Thyroid function studies No results for input(s): TSH, T4TOTAL, T3FREE, THYROIDAB in the last 72 hours.  Invalid input(s): FREET3 Anemia work up No results for input(s): VITAMINB12, FOLATE, FERRITIN, TIBC, IRON, RETICCTPCT in the last 72 hours. Urinalysis    Component Value Date/Time   COLORURINE YELLOW 07/16/2007 0900   APPEARANCEUR CLEAR 07/16/2007 0900   LABSPEC 1.020 07/16/2007 0900   PHURINE 6.0 07/16/2007 0900   GLUCOSEU >1000 (A) 07/16/2007 0900   HGBUR NEGATIVE 07/16/2007 0900   BILIRUBINUR NEGATIVE 07/16/2007 0900   KETONESUR NEGATIVE 07/16/2007 0900   PROTEINUR 30 (A) 07/16/2007 0900   UROBILINOGEN 0.2 07/16/2007 0900   NITRITE NEGATIVE 07/16/2007 0900   LEUKOCYTESUR NEGATIVE 07/16/2007 0900   Sepsis Labs Invalid input(s): PROCALCITONIN,  WBC,  LACTICIDVEN Microbiology Recent Results (from the past 240 hour(s))  SARS CORONAVIRUS 2 (TAT 6-24 HRS) Nasopharyngeal Nasopharyngeal Swab     Status: None   Collection Time: 03/25/20  9:01 PM   Specimen: Nasopharyngeal Swab  Result Value Ref Range Status   SARS Coronavirus 2 NEGATIVE NEGATIVE Final    Comment: (NOTE) SARS-CoV-2 target nucleic acids are NOT DETECTED. The SARS-CoV-2 RNA is generally detectable in upper and lower respiratory specimens during the acute phase of infection. Negative results do not preclude SARS-CoV-2 infection, do not rule out co-infections with other pathogens, and should not be used as the sole basis for treatment or other patient management decisions. Negative results must be combined with clinical  observations, patient history, and epidemiological information. The expected result is Negative. Fact Sheet for Patients: SugarRoll.be Fact Sheet for Healthcare Providers: https://www.woods-mathews.com/ This test is not yet approved or cleared by the [place] FDA and  has been authorized for detection and/or diagnosis  of SARS-CoV-2 by FDA under an Emergency Use Authorization (EUA). This EUA will remain  in effect (meaning this test can be used) for the duration of the COVID-19 declaration under Section 56 4(b)(1) of the Act, 21 U.S.C. section 360bbb-3(b)(1), unless the authorization is terminated or revoked sooner. Performed at [facility], [place] [address], [place], [place] [address]      Time coordinating discharge: Over 30 minutes  SIGNED:   Little Ishikawa, DO [facility] 03/27/2020, 3:18 PM Pager   If 7PM-7AM, please contact night-coverage [email or web address]

## 2020-03-27 NOTE — Progress Notes (Signed)
BS 506. Dr. Avon Gully notified. New orders given. Carroll Kinds RN

## 2020-03-27 NOTE — Progress Notes (Signed)
Made Dr. Avon Gully aware BS 560. New orders given. Carroll Kinds RN

## 2020-03-27 NOTE — Progress Notes (Signed)
Dr. Avon Gully aware pt's BS remains 426. States ok to cont with discharge and take normal insulin dose when he gets home. Carroll Kinds RN

## 2020-04-05 ENCOUNTER — Other Ambulatory Visit: Payer: Self-pay

## 2020-04-05 ENCOUNTER — Ambulatory Visit (HOSPITAL_COMMUNITY)
Admission: RE | Admit: 2020-04-05 | Discharge: 2020-04-05 | Disposition: A | Discharge status: Home / Self Care | Payer: 59 | Source: Ambulatory Visit | Attending: Internal Medicine | Admitting: Internal Medicine

## 2020-04-05 ENCOUNTER — Encounter (HOSPITAL_COMMUNITY): Payer: Self-pay

## 2020-04-05 VITALS — BP 166/84 | HR 60 | Ht 74.0 in | Wt 315.0 lb

## 2020-04-05 DIAGNOSIS — Z9989 Dependence on other enabling machines and devices: Secondary | ICD-10-CM | POA: Diagnosis not present

## 2020-04-05 DIAGNOSIS — E1122 Type 2 diabetes mellitus with diabetic chronic kidney disease: Secondary | ICD-10-CM | POA: Diagnosis not present

## 2020-04-05 DIAGNOSIS — Z833 Family history of diabetes mellitus: Secondary | ICD-10-CM | POA: Diagnosis not present

## 2020-04-05 DIAGNOSIS — K760 Fatty (change of) liver, not elsewhere classified: Secondary | ICD-10-CM | POA: Diagnosis not present

## 2020-04-05 DIAGNOSIS — Z79899 Other long term (current) drug therapy: Secondary | ICD-10-CM | POA: Diagnosis not present

## 2020-04-05 DIAGNOSIS — I872 Venous insufficiency (chronic) (peripheral): Secondary | ICD-10-CM | POA: Insufficient documentation

## 2020-04-05 DIAGNOSIS — N1831 Chronic kidney disease, stage 3a: Secondary | ICD-10-CM | POA: Insufficient documentation

## 2020-04-05 DIAGNOSIS — I130 Hypertensive heart and chronic kidney disease with heart failure and stage 1 through stage 4 chronic kidney disease, or unspecified chronic kidney disease: Secondary | ICD-10-CM | POA: Insufficient documentation

## 2020-04-05 DIAGNOSIS — Z79890 Hormone replacement therapy (postmenopausal): Secondary | ICD-10-CM | POA: Insufficient documentation

## 2020-04-05 DIAGNOSIS — I10 Essential (primary) hypertension: Secondary | ICD-10-CM

## 2020-04-05 DIAGNOSIS — E039 Hypothyroidism, unspecified: Secondary | ICD-10-CM | POA: Diagnosis not present

## 2020-04-05 DIAGNOSIS — I5032 Chronic diastolic (congestive) heart failure: Secondary | ICD-10-CM | POA: Diagnosis not present

## 2020-04-05 DIAGNOSIS — I517 Cardiomegaly: Secondary | ICD-10-CM

## 2020-04-05 DIAGNOSIS — Z713 Dietary counseling and surveillance: Secondary | ICD-10-CM | POA: Diagnosis not present

## 2020-04-05 DIAGNOSIS — Z794 Long term (current) use of insulin: Secondary | ICD-10-CM | POA: Insufficient documentation

## 2020-04-05 DIAGNOSIS — Z6841 Body Mass Index (BMI) 40.0 and over, adult: Secondary | ICD-10-CM | POA: Diagnosis not present

## 2020-04-05 DIAGNOSIS — E7800 Pure hypercholesterolemia, unspecified: Secondary | ICD-10-CM | POA: Diagnosis not present

## 2020-04-05 DIAGNOSIS — E669 Obesity, unspecified: Secondary | ICD-10-CM | POA: Insufficient documentation

## 2020-04-05 DIAGNOSIS — E1142 Type 2 diabetes mellitus with diabetic polyneuropathy: Secondary | ICD-10-CM | POA: Diagnosis not present

## 2020-04-05 DIAGNOSIS — Z8249 Family history of ischemic heart disease and other diseases of the circulatory system: Secondary | ICD-10-CM | POA: Diagnosis not present

## 2020-04-05 DIAGNOSIS — E785 Hyperlipidemia, unspecified: Secondary | ICD-10-CM | POA: Diagnosis not present

## 2020-04-05 DIAGNOSIS — G4733 Obstructive sleep apnea (adult) (pediatric): Secondary | ICD-10-CM | POA: Diagnosis not present

## 2020-04-05 LAB — BASIC METABOLIC PANEL
Anion gap: 10 (ref 5–15)
BUN: 28 mg/dL — ABNORMAL HIGH (ref 8–23)
CO2: 28 mmol/L (ref 22–32)
Calcium: 8.6 mg/dL — ABNORMAL LOW (ref 8.9–10.3)
Chloride: 100 mmol/L (ref 98–111)
Creatinine, Ser: 2.41 mg/dL — ABNORMAL HIGH (ref 0.61–1.24)
GFR calc Af Amer: 32 mL/min — ABNORMAL LOW (ref >60)
GFR calc non Af Amer: 27 mL/min — ABNORMAL LOW (ref >60)
Glucose, Bld: 201 mg/dL — ABNORMAL HIGH (ref 70–99)
Potassium: 3.2 mmol/L — ABNORMAL LOW (ref 3.5–5.1)
Sodium: 138 mmol/L (ref 135–145)

## 2020-04-05 LAB — BRAIN NATRIURETIC PEPTIDE: B Natriuretic Peptide: 248.5 pg/mL — ABNORMAL HIGH (ref 0.0–100.0)

## 2020-04-05 MED ORDER — SACUBITRIL-VALSARTAN 49-51 MG PO TABS: 1.0000 | ORAL_TABLET | Freq: Two times a day (BID) | ORAL | 5 refills | Status: DC

## 2020-04-05 MED ORDER — SACUBITRIL-VALSARTAN 49-51 MG PO TABS
1.0000 | ORAL_TABLET | Freq: Two times a day (BID) | ORAL | 5 refills | Status: DC
Start: 2020-04-05 — End: 2020-04-05

## 2020-04-05 NOTE — Progress Notes (Signed)
ADVANCED HF CLINIC CONSULT NOTE  Referring Physician: Dr Avon Gully  Primary Care: Primary Cardiologist: None   HPI: Brendan [name] is a 64 year old referred by Dr Avon Gully for heart failure consultation.   Brendan [name] is 64 year old with history of diastolic heart failure, HTN, OSA on CPAP, DMII, hypothyroidism, amd obesity. Rarely drinks alcohol.   Admitted to [facility] 07/20/46 with A/C diastolic heart failure. Diuresed with IV lasix and transitioned to torsemide 40 mg twice a day. Discharge weight 301 pounds.   Today he returns for HF follow up.Overall feeling fine. Says he is not very active due to joint pain and shortness of breath. SOB with steps. Complains of burning on left side of his leg. Denies PND/Orthopnea. Using CPAP at night. Appetite ok. No fever or chills. Weight at home trending 306-311  pounds. Taking all medications. Retired Engineer, structural.   ECHO 03/2020 EF 55-60% Grade II DD. Moderate LVH , RV normal    Review of Systems: [y] = yes, [ ]  = no   General: Weight gain [ ] ; Weight loss [ ] ; Anorexia [ ] ; Fatigue [ ] ; Fever [ ] ; Chills [ ] ; Weakness [ ]   Cardiac: Chest pain/pressure [ ] ; Resting SOB [ ] ; Exertional SOB Y[ ] ; Orthopnea [ ] ; Pedal Edema [ Y]; Palpitations [ ] ; Syncope [ ] ; Presyncope [ ] ; Paroxysmal nocturnal dyspnea[ ]   Pulmonary: Cough [ ] ; Wheezing[ ] ; Hemoptysis[ ] ; Sputum [ ] ; Snoring [ ]   GI: Vomiting[ ] ; Dysphagia[ ] ; Melena[ ] ; Hematochezia [ ] ; Heartburn[ ] ; Abdominal pain [ ] ; Constipation [ ] ; Diarrhea [ ] ; BRBPR [ ]   GU: Hematuria[ ] ; Dysuria [ ] ; Nocturia[ ]   Vascular: Pain in legs with walking [ ] ; Pain in feet with lying flat [ ] ; Non-healing sores [ ] ; Stroke [ ] ; TIA [ ] ; Slurred speech [ ] ;  Neuro: Headaches[ ] ; Vertigo[ ] ; Seizures[ ] ; Paresthesias[ ] ;Blurred vision [ ] ; Diplopia [ ] ; Vision changes [ ]   Ortho/Skin: Arthritis [Y ]; Joint pain [ Y]; Muscle pain [ ] ; Joint swelling [ ] ; Back Pain [ ] ; Rash [ ]   Psych: Depression[ ] ; Anxiety[ ]   Heme:  Bleeding problems [ ] ; Clotting disorders [ ] ; Anemia [ ]   Endocrine: Diabetes [Y ]; Thyroid dysfunction[Y ]   Past Medical History:  Diagnosis Date  . Acanthosis nigricans   . Atopic dermatitis   . Diabetes ([place]) 2004  . Erectile dysfunction   . Fatty liver 2007  . Glaucoma 2008  . Gynecomastia 2009  . Hyperaldosteronism ([place]) 2000  . Hypercholesterolemia   . Hyperlipidemia   . Hypertension   . Hypoglycemic reaction   . Hypothyroidism 2004  . Incontinence   . Intermittent vertigo 2012  . Left cervical radiculopathy 2012  . Lumbar radiculopathy   . Obesity   . Peripheral neuropathy   . Pollen allergies 2007   perennial  . Prostate cancer ([place]) 2006  . Reflux esophagitis 1995  . Sickle cell trait ([place]) 2006  . Sleep apnea, obstructive 2000   uses a cpap  . Venous insufficiency 2005  . Vitamin D deficiency 2012    Current Outpatient Medications  Medication Sig Dispense Refill  . acetaminophen (TYLENOL) 500 MG tablet Take 500 mg by mouth every 6 (six) hours as needed for mild pain.    . Blood Glucose Monitoring Suppl (ONE TOUCH ULTRA SYSTEM KIT) W/DEVICE KIT 1 kit by Does not apply route once. One touch ultrasoft lancets    . brimonidine-timolol (COMBIGAN) 0.2-0.5 %  ophthalmic solution Place 1 drop into both eyes every 12 (twelve) hours.    . carvedilol (COREG) 25 MG tablet Take 25 mg by mouth 2 (two) times daily.    Marland Kitchen glucose blood test strip 1 each by Other route as needed for other. Use as instructed one touch test strips    . Insulin Lispro Prot & Lispro (HUMALOG MIX 75/25 [place]) Inject 60-100 Units into the skin daily. humalog mix 75/25 injection 60-100 units q am sliding scale. Take 60 units if sugar is <180, then take 100 units if sugar is over 400    . insulin lispro protamine-lispro (HUMALOG 50/50 MIX) (50-50) 100 UNIT/ML SUSP injection Inject 40 Units into the skin daily.    . Lancets (ONETOUCH ULTRASOFT) lancets     . levothyroxine (SYNTHROID) 175 MCG tablet Take  175 mcg by mouth daily before breakfast.    . Potassium Chloride ER 20 MEQ TBCR Take 20 mEq by mouth 3 (three) times daily. 90 tablet 0  . simvastatin (ZOCOR) 20 MG tablet Take 20 mg by mouth every evening.    Marland Kitchen telmisartan (MICARDIS) 80 MG tablet Take 80 mg by mouth in the morning and at bedtime.    . torsemide (DEMADEX) 20 MG tablet Take 2 tablets (40 mg total) by mouth 2 (two) times daily. 940 tablet 0  . TRULICITY 3 HW/8.0SU SOPN Inject 3.5 mg into the skin once a week.     No current facility-administered medications for this encounter.    Allergies  Allergen Reactions  . Ace Inhibitors     REACTION: cough  . Maxidex [Dexamethasone] Other (See Comments)    Sexual dysfunction  . Verapamil     REACTION: constipation      Social History   Socioeconomic History  . Marital status: Married    Spouse name: Not on file  . Number of children: 3  . Years of education: Not on file  . Highest education level: Not on file  Occupational History  . Occupation: truck Geophysicist/field seismologist    Comment: Retired Economist  Tobacco Use  . Smoking status: Never Smoker  . Smokeless tobacco: Never Used  Substance and Sexual Activity  . Alcohol use: Yes    Comment: one drink every few months  . Drug use: No  . Sexual activity: Not on file  Other Topics Concern  . Not on file  Social History Narrative  . Not on file   Social Determinants of Health   Financial Resource Strain:   . Difficulty of Paying Living Expenses:   Food Insecurity:   . Worried About Charity fundraiser in the Last Year:   . Arboriculturist in the Last Year:   Transportation Needs:   . Film/video editor (Medical):   Marland Kitchen Lack of Transportation (Non-Medical):   Physical Activity:   . Days of Exercise per Week:   . Minutes of Exercise per Session:   Stress:   . Feeling of Stress :   Social Connections:   . Frequency of Communication with Friends and Family:   . Frequency of Social Gatherings with Friends and  Family:   . Attends Religious Services:   . Active Member of Clubs or Organizations:   . Attends Archivist Meetings:   Marland Kitchen Marital Status:   Intimate Partner Violence:   . Fear of Current or Ex-Partner:   . Emotionally Abused:   Marland Kitchen Physically Abused:   . Sexually Abused:  Family History  Problem Relation Age of Onset  . Hypertension Father        deceased age 58  . Heart attack Father   . COPD Sister   . CVA Sister   . Diabetes Daughter   . Colon cancer Neg Hx   . Stomach cancer Neg Hx     Vitals:   04/05/20 1428  BP: (!) 166/84  Pulse: 60  SpO2: 100%  Weight: (!) 142.9 kg  Height: 6' 2"  (1.88 m)   Wt Readings from Last 3 Encounters:  04/05/20 (!) 142.9 kg  03/27/20 (!) 145 kg  10/29/16 (!) 148.1 kg    PHYSICAL EXAM: General:  Well appearing. No respiratory difficulty HEENT: normal Neck: supple. JVP 10-11  Carotids 2+ bilat; no bruits. No lymphadenopathy or thryomegaly appreciated. Cor: PMI nondisplaced. Regular rate & rhythm. No rubs, gallops or murmurs. Lungs: clear Abdomen: obese, soft, nontender, nondistended. No hepatosplenomegaly. No bruits or masses. Good bowel sounds. Extremities: no cyanosis, clubbing, rash, R and LLE trace edema Neuro: alert & oriented x 3, cranial nerves grossly intact. moves all 4 extremities w/o difficulty. Affect pleasant.     ASSESSMENT & PLAN: 1. Chronic Diastolic Heart Failure, ECHO 03/2020 EF preserved EF LVH Grade II DD - With LVH will check myeloma panel for amyloidosis.  NYHA III. Volume status mildly elevated. Continue torsemide 40 mg twice a day  - Add entresto 49-51 mg twice a day.  - Discussed low salt food choices and limiting fluid intake to < 2 liters per day  2.HTN  Elevated. Stop telmisartan. Start entresto 49-51 mg twice a day with first dose tonight.   - Check BMET in 7 days.   3. DMII Hgb  A1C >6  Followed by Endocrinology   4. OSA Continue CPAP every night.   5. Obesity  Body mass  index is 40.44 kg/m.  Discussed portion control   6. CKD Stage IIIa  Creatinine baseline ~ 2 - Check BMET today.   Follow up in 7 days with BMET and 14 days for visit. Consider spiro at that time.   Follow up in 3 months with Dr Donnamae Jude NP-C  4:57 PM

## 2020-04-05 NOTE — Patient Instructions (Signed)
STOP Telmisartan  START Entresto 49/51mg  (1 tab) twice a day    Labs today We will only contact you if something comes back abnormal or we need to make some changes. Otherwise no news is good news!  Repeat labs in 1 week Tuesday May 25th, 2021 at [place] recommends that you schedule a follow-up appointment in: 2 weeks with the Nurse Practitioner  Appointment  Thursday June 3rd, 2021 at [facility] code 5007    Please call office at [phone] option 2 if you have any questions or concerns.    At the [facility], you and your health needs are our priority. As part of our continuing mission to provide you with exceptional heart care, we have created designated Provider Care Teams. These Care Teams include your primary Cardiologist (physician) and Advanced Practice Providers (APPs- Physician Assistants and Nurse Practitioners) who all work together to provide you with the care you need, when you need it.   You may see any of the following providers on your designated Care Team at your next follow up: Marland Kitchen Dr Glori Bickers . Dr Loralie Champagne . Darrick Grinder, NP . Lyda Jester, PA . Audry Riles, PharmD   Please be sure to bring in all your medications bottles to every appointment.

## 2020-04-07 ENCOUNTER — Ambulatory Visit: Payer: 59 | Admitting: Cardiovascular Disease

## 2020-04-08 LAB — MULTIPLE MYELOMA PANEL, SERUM
Albumin SerPl Elph-Mcnc: 2.8 g/dL — ABNORMAL LOW (ref 2.9–4.4)
Albumin/Glob SerPl: 0.9 (ref 0.7–1.7)
Alpha 1: 0.2 g/dL (ref 0.0–0.4)
Alpha2 Glob SerPl Elph-Mcnc: 0.9 g/dL (ref 0.4–1.0)
B-Globulin SerPl Elph-Mcnc: 0.9 g/dL (ref 0.7–1.3)
Gamma Glob SerPl Elph-Mcnc: 1.4 g/dL (ref 0.4–1.8)
Globulin, Total: 3.4 g/dL (ref 2.2–3.9)
IgA: 253 mg/dL (ref 61–437)
IgG (Immunoglobin G), Serum: 1620 mg/dL — ABNORMAL HIGH (ref 603–1613)
IgM (Immunoglobulin M), Srm: 49 mg/dL (ref 20–172)
Total Protein ELP: 6.2 g/dL (ref 6.0–8.5)

## 2020-04-12 ENCOUNTER — Telehealth (HOSPITAL_COMMUNITY): Payer: Self-pay | Admitting: Cardiology

## 2020-04-12 ENCOUNTER — Other Ambulatory Visit: Payer: Self-pay

## 2020-04-12 ENCOUNTER — Telehealth (HOSPITAL_COMMUNITY): Payer: Self-pay | Admitting: Pharmacy Technician

## 2020-04-12 ENCOUNTER — Ambulatory Visit (HOSPITAL_COMMUNITY)
Admission: RE | Admit: 2020-04-12 | Discharge: 2020-04-12 | Disposition: A | Discharge status: Home / Self Care | Payer: 59 | Source: Ambulatory Visit | Attending: Internal Medicine | Admitting: Internal Medicine

## 2020-04-12 DIAGNOSIS — I5032 Chronic diastolic (congestive) heart failure: Secondary | ICD-10-CM | POA: Diagnosis not present

## 2020-04-12 LAB — BASIC METABOLIC PANEL
Anion gap: 9 (ref 5–15)
BUN: 20 mg/dL (ref 8–23)
CO2: 28 mmol/L (ref 22–32)
Calcium: 8.5 mg/dL — ABNORMAL LOW (ref 8.9–10.3)
Chloride: 104 mmol/L (ref 98–111)
Creatinine, Ser: 2.41 mg/dL — ABNORMAL HIGH (ref 0.61–1.24)
GFR calc Af Amer: 32 mL/min — ABNORMAL LOW (ref >60)
GFR calc non Af Amer: 27 mL/min — ABNORMAL LOW (ref >60)
Glucose, Bld: 153 mg/dL — ABNORMAL HIGH (ref 70–99)
Potassium: 3.1 mmol/L — ABNORMAL LOW (ref 3.5–5.1)
Sodium: 141 mmol/L (ref 135–145)

## 2020-04-12 MED ORDER — POTASSIUM CHLORIDE ER 20 MEQ PO TBCR: EXTENDED_RELEASE_TABLET | ORAL | 1 refills | Status: DC

## 2020-04-12 NOTE — Telephone Encounter (Signed)
-----   Message from Conrad Athens, NP sent at 04/12/2020 12:16 PM EDT ----- Renal function stable. Increase K to 40/40/20 .

## 2020-04-12 NOTE — Telephone Encounter (Signed)
Patient Advocate Encounter   Received notification from [place] that prior authorization for Delene Loll is required.   PA submitted on CoverMyMeds Key [id number] Status is pending   Will continue to follow.

## 2020-04-15 ENCOUNTER — Telehealth (HOSPITAL_COMMUNITY): Payer: Self-pay | Admitting: Pharmacist

## 2020-04-15 NOTE — Telephone Encounter (Signed)
Prior authorization for Brendan [name] was denied. Appeal submitted to [facility] on 04/15/2020. Application pending, will continue to follow.   Audry Riles, PharmD, BCPS, BCCP, CPP Heart Failure Clinic Pharmacist [phone]

## 2020-04-20 NOTE — Progress Notes (Signed)
ADVANCED HF CLINIC CONSULT NOTE  Referring Physician: Dr Avon Gully  Primary Care: Primary Cardiologist: None   HPI: Brendan [name] is 64 year old with history of diastolic heart failure, HTN, OSA on CPAP, DMII, hypothyroidism, amd obesity. Rarely drinks alcohol.   Admitted to [facility] 04/19/94 with A/C diastolic heart failure. Diuresed with IV lasix and transitioned to torsemide 40 mg twice a day. Discharge weight 301 pounds.   Today he returns for HF follow up. Last visit entresto was ordered but insurance did not approve so he kept taking telmisartan. BP at home continues to run high. Overall feeling ok but having left hand pain. SOB with exertion. Denies PND/Orthopnea. Using CPAP at night. Appetite ok. No fever or chills. Weight at home 305-309  pounds. Taking all medications. Says he was started on medication for gout but never started it.   ECHO 03/2020 EF 55-60% Grade II DD. Moderate LVH , RV normal     Past Medical History:  Diagnosis Date  . Acanthosis nigricans   . Atopic dermatitis   . Diabetes ([place]) 2004  . Erectile dysfunction   . Fatty liver 2007  . Glaucoma 2008  . Gynecomastia 2009  . Hyperaldosteronism ([place]) 2000  . Hypercholesterolemia   . Hyperlipidemia   . Hypertension   . Hypoglycemic reaction   . Hypothyroidism 2004  . Incontinence   . Intermittent vertigo 2012  . Left cervical radiculopathy 2012  . Lumbar radiculopathy   . Obesity   . Peripheral neuropathy   . Pollen allergies 2007   perennial  . Prostate cancer ([place]) 2006  . Reflux esophagitis 1995  . Sickle cell trait ([place]) 2006  . Sleep apnea, obstructive 2000   uses a cpap  . Venous insufficiency 2005  . Vitamin D deficiency 2012    Current Outpatient Medications  Medication Sig Dispense Refill  . acetaminophen (TYLENOL) 500 MG tablet Take 500 mg by mouth every 6 (six) hours as needed for mild pain.    . Blood Glucose Monitoring Suppl (ONE TOUCH ULTRA SYSTEM KIT) W/DEVICE KIT 1 kit by Does not apply  route once. One touch ultrasoft lancets    . brimonidine-timolol (COMBIGAN) 0.2-0.5 % ophthalmic solution Place 1 drop into both eyes every 12 (twelve) hours.    . carvedilol (COREG) 25 MG tablet Take 25 mg by mouth 2 (two) times daily.    Marland Kitchen glucose blood test strip 1 each by Other route as needed for other. Use as instructed one touch test strips    . Insulin Lispro Prot & Lispro (HUMALOG MIX 75/25 [place]) Inject 60-100 Units into the skin daily. humalog mix 75/25 injection 60-100 units q am sliding scale. Take 60 units if sugar is <180, then take 100 units if sugar is over 400    . insulin lispro protamine-lispro (HUMALOG 50/50 MIX) (50-50) 100 UNIT/ML SUSP injection Inject 40 Units into the skin daily.    . INVOKANA 100 MG TABS tablet Take 100 mg by mouth daily.    . Lancets (ONETOUCH ULTRASOFT) lancets     . levothyroxine (SYNTHROID) 175 MCG tablet Take 175 mcg by mouth daily before breakfast.    . Potassium Chloride ER 20 MEQ TBCR 40 meq in the AM, 40 meq at lunch, and 20 meq in the PM 150 tablet 1  . simvastatin (ZOCOR) 20 MG tablet Take 20 mg by mouth every evening.    Marland Kitchen telmisartan (MICARDIS) 80 MG tablet Take 80 mg by mouth in the morning and at bedtime.    Marland Kitchen  torsemide (DEMADEX) 20 MG tablet Take 2 tablets (40 mg total) by mouth 2 (two) times daily. 458 tablet 0  . TRULICITY 3 KD/9.8PJ SOPN Inject 3.5 mg into the skin once a week.     No current facility-administered medications for this encounter.    Allergies  Allergen Reactions  . Ace Inhibitors     REACTION: cough  . Maxidex [Dexamethasone] Other (See Comments)    Sexual dysfunction  . Verapamil     REACTION: constipation      Social History   Socioeconomic History  . Marital status: Married    Spouse name: Not on file  . Number of children: 3  . Years of education: Not on file  . Highest education level: Not on file  Occupational History  . 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 Smoking status: Never Smoker  . Smokeless tobacco: Never Used  Substance and Sexual Activity  . Alcohol use: Yes    Comment: one drink every few months  . Drug use: No  . Sexual activity: Not on file  Other Topics Concern  . Not on file  Social History Narrative  . Not on file   Social Determinants of Health   Financial Resource Strain:   . Difficulty of Paying Living Expenses:   Food Insecurity:   . Worried About Charity fundraiser in the Last Year:   . Arboriculturist in the Last Year:   Transportation Needs:   . Film/video editor (Medical):   Marland Kitchen Lack of Transportation (Non-Medical):   Physical Activity:   . Days of Exercise per Week:   . Minutes of Exercise per Session:   Stress:   . Feeling of Stress :   Social Connections:   . Frequency of Communication with Friends and Family:   . Frequency of Social Gatherings with Friends and Family:   . Attends Religious Services:   . Active Member of Clubs or Organizations:   . Attends Archivist Meetings:   Marland Kitchen Marital Status:   Intimate Partner Violence:   . Fear of Current or Ex-Partner:   . Emotionally Abused:   Marland Kitchen Physically Abused:   . Sexually Abused:       Family History  Problem Relation Age of Onset  . Hypertension Father        deceased age 64  . Heart attack Father   . COPD Sister   . CVA Sister   . Diabetes Daughter   . Colon cancer Neg Hx   . Stomach cancer Neg Hx     Vitals:   04/21/20 0908  BP: (!) 166/82  Pulse: 69  SpO2: 98%  Weight: (!) 143.4 kg (316 lb 3.2 oz)   Wt Readings from Last 3 Encounters:  04/21/20 (!) 143.4 kg (316 lb 3.2 oz)  04/05/20 (!) 142.9 kg (315 lb)  03/27/20 (!) 145 kg (319 lb 10.7 oz)    PHYSICAL EXAM: General:  Well appearing. No resp difficulty HEENT: normal Neck: supple. no JVD. Carotids 2+ bilat; no bruits. No lymphadenopathy or thryomegaly appreciated. Cor: PMI nondisplaced. Regular rate & rhythm. No rubs, gallops or murmurs. Lungs:  clear Abdomen: soft, nontender, nondistended. No hepatosplenomegaly. No bruits or masses. Good bowel sounds. Extremities: no cyanosis, clubbing, rash, R and LLE 1+ edema. L hand 4th finger edematous and tender to touch.  Neuro: alert & orientedx3, cranial nerves grossly intact. moves all 4 extremities w/o difficulty.  Affect pleasant    ASSESSMENT & PLAN: 1. Chronic Diastolic Heart Failure, ECHO 03/2020 EF preserved EF LVH Grade II DD - With LVH will check myeloma panel for amyloidosis.  NYHA III. Volume status mildly elevated.Continue torsemide 40 mg twice a day will not push diuretics at this time with suspected gout flar.  - Unable to start entresto due to insurance.  - Discussed low salt food choices and limiting fluid intake to < 2 liters per day  2.HTN  Unable to start entresto due to insurance. Continue telmisartan.  - Add hydralazine 25 mg three times a day  - Instructed to continue monitoring BP at home.  3. DMII Hgb  A1C >6  Followed by Endocrinology   4. OSA Continue CPAP every night. .   5. Obesity  Body mass index is 40.6 kg/m.  Discussed portion control   6. CKD Stage IIIa  Creatinine baseline ~ 2  7. L 4th finger pain, Suspected Gout  Check uric acid.  - He given medications for suspected gout but he did not start.  - I will call him back with results. Needs follow up with PCP  Follow up with Dr Haroldine Laws 6-8 weeks.     Karolyne Timmons NP-C  9:34 AM

## 2020-04-21 ENCOUNTER — Ambulatory Visit (HOSPITAL_COMMUNITY)
Admission: RE | Admit: 2020-04-21 | Discharge: 2020-04-21 | Disposition: A | Discharge status: Home / Self Care | Payer: 59 | Source: Ambulatory Visit | Attending: Adult Health | Admitting: Adult Health

## 2020-04-21 ENCOUNTER — Other Ambulatory Visit: Payer: Self-pay

## 2020-04-21 ENCOUNTER — Encounter (HOSPITAL_COMMUNITY): Payer: Self-pay

## 2020-04-21 VITALS — BP 166/82 | HR 69 | Wt 316.2 lb

## 2020-04-21 DIAGNOSIS — Z833 Family history of diabetes mellitus: Secondary | ICD-10-CM | POA: Diagnosis not present

## 2020-04-21 DIAGNOSIS — M79645 Pain in left finger(s): Secondary | ICD-10-CM | POA: Diagnosis not present

## 2020-04-21 DIAGNOSIS — Z79890 Hormone replacement therapy (postmenopausal): Secondary | ICD-10-CM | POA: Insufficient documentation

## 2020-04-21 DIAGNOSIS — I10 Essential (primary) hypertension: Secondary | ICD-10-CM

## 2020-04-21 DIAGNOSIS — N1831 Chronic kidney disease, stage 3a: Secondary | ICD-10-CM | POA: Diagnosis not present

## 2020-04-21 DIAGNOSIS — E1142 Type 2 diabetes mellitus with diabetic polyneuropathy: Secondary | ICD-10-CM | POA: Insufficient documentation

## 2020-04-21 DIAGNOSIS — G4733 Obstructive sleep apnea (adult) (pediatric): Secondary | ICD-10-CM | POA: Insufficient documentation

## 2020-04-21 DIAGNOSIS — E039 Hypothyroidism, unspecified: Secondary | ICD-10-CM | POA: Diagnosis not present

## 2020-04-21 DIAGNOSIS — K760 Fatty (change of) liver, not elsewhere classified: Secondary | ICD-10-CM | POA: Diagnosis not present

## 2020-04-21 DIAGNOSIS — I517 Cardiomegaly: Secondary | ICD-10-CM

## 2020-04-21 DIAGNOSIS — Z91128 Patient's intentional underdosing of medication regimen for other reason: Secondary | ICD-10-CM | POA: Insufficient documentation

## 2020-04-21 DIAGNOSIS — I5032 Chronic diastolic (congestive) heart failure: Secondary | ICD-10-CM | POA: Insufficient documentation

## 2020-04-21 DIAGNOSIS — E1122 Type 2 diabetes mellitus with diabetic chronic kidney disease: Secondary | ICD-10-CM | POA: Insufficient documentation

## 2020-04-21 DIAGNOSIS — Z79899 Other long term (current) drug therapy: Secondary | ICD-10-CM | POA: Insufficient documentation

## 2020-04-21 DIAGNOSIS — E7800 Pure hypercholesterolemia, unspecified: Secondary | ICD-10-CM | POA: Diagnosis not present

## 2020-04-21 DIAGNOSIS — T50906A Underdosing of unspecified drugs, medicaments and biological substances, initial encounter: Secondary | ICD-10-CM | POA: Insufficient documentation

## 2020-04-21 DIAGNOSIS — E269 Hyperaldosteronism, unspecified: Secondary | ICD-10-CM | POA: Diagnosis not present

## 2020-04-21 DIAGNOSIS — Z6841 Body Mass Index (BMI) 40.0 and over, adult: Secondary | ICD-10-CM | POA: Diagnosis not present

## 2020-04-21 DIAGNOSIS — Z8249 Family history of ischemic heart disease and other diseases of the circulatory system: Secondary | ICD-10-CM | POA: Insufficient documentation

## 2020-04-21 DIAGNOSIS — E785 Hyperlipidemia, unspecified: Secondary | ICD-10-CM | POA: Insufficient documentation

## 2020-04-21 DIAGNOSIS — I130 Hypertensive heart and chronic kidney disease with heart failure and stage 1 through stage 4 chronic kidney disease, or unspecified chronic kidney disease: Secondary | ICD-10-CM | POA: Diagnosis not present

## 2020-04-21 DIAGNOSIS — I5022 Chronic systolic (congestive) heart failure: Secondary | ICD-10-CM | POA: Diagnosis not present

## 2020-04-21 DIAGNOSIS — E669 Obesity, unspecified: Secondary | ICD-10-CM | POA: Insufficient documentation

## 2020-04-21 DIAGNOSIS — Z9989 Dependence on other enabling machines and devices: Secondary | ICD-10-CM | POA: Diagnosis not present

## 2020-04-21 DIAGNOSIS — Z794 Long term (current) use of insulin: Secondary | ICD-10-CM | POA: Insufficient documentation

## 2020-04-21 LAB — URIC ACID: Uric Acid, Serum: 10.4 mg/dL — ABNORMAL HIGH (ref 3.7–8.6)

## 2020-04-21 MED ORDER — HYDRALAZINE HCL 25 MG PO TABS
25.0000 mg | ORAL_TABLET | Freq: Three times a day (TID) | ORAL | 3 refills | Status: DC
Start: 2020-04-21 — End: 2020-06-16

## 2020-04-21 NOTE — Patient Instructions (Addendum)
START Hydralazine 25mg  (1 tab) three times a day  Labs today We will only contact you if something comes back abnormal or we need to make some changes. Otherwise no news is good news!  Your physician recommends that you schedule a follow-up appointment in: 8 weeks with Dr Haroldine Laws  Please call office at [phone] option 2 if you have any questions or concerns.   At the [facility], you and your health needs are our priority. As part of our continuing mission to provide you with exceptional heart care, we have created designated Provider Care Teams. These Care Teams include your primary Cardiologist (physician) and Advanced Practice Providers (APPs- Physician Assistants and Nurse Practitioners) who all work together to provide you with the care you need, when you need it.   You may see any of the following providers on your designated Care Team at your next follow up: Marland Kitchen Dr Glori Bickers . Dr Loralie Champagne . Darrick Grinder, NP . Lyda Jester, PA . Audry Riles, PharmD   Please be sure to bring in all your medications bottles to every appointment.

## 2020-05-09 ENCOUNTER — Telehealth (HOSPITAL_COMMUNITY): Payer: Self-pay | Admitting: Pharmacy Technician

## 2020-05-09 NOTE — Telephone Encounter (Signed)
Brendan [name] was approved on appeal. Copay $0.00.    Audry Riles, PharmD, BCPS, BCCP, CPP Heart Failure Clinic Pharmacist [phone]

## 2020-05-09 NOTE — Telephone Encounter (Signed)
Called and left patient message regarding approval and $0 co-pay.  Charlann Boxer, CPhT

## 2020-06-07 ENCOUNTER — Other Ambulatory Visit: Payer: Self-pay | Admitting: Internal Medicine

## 2020-06-07 ENCOUNTER — Ambulatory Visit
Admission: RE | Admit: 2020-06-07 | Discharge: 2020-06-07 | Disposition: A | Discharge status: Home / Self Care | Payer: No Typology Code available for payment source | Source: Ambulatory Visit | Attending: Internal Medicine | Admitting: Internal Medicine

## 2020-06-07 DIAGNOSIS — M25552 Pain in left hip: Secondary | ICD-10-CM

## 2020-06-16 ENCOUNTER — Ambulatory Visit (HOSPITAL_COMMUNITY)
Admission: RE | Admit: 2020-06-16 | Discharge: 2020-06-16 | Disposition: A | Discharge status: Home / Self Care | Payer: PRIVATE HEALTH INSURANCE | Source: Ambulatory Visit | Attending: Internal Medicine | Admitting: Internal Medicine

## 2020-06-16 ENCOUNTER — Other Ambulatory Visit: Payer: Self-pay

## 2020-06-16 ENCOUNTER — Encounter (HOSPITAL_COMMUNITY): Payer: Self-pay | Admitting: Internal Medicine

## 2020-06-16 VITALS — BP 188/96 | HR 63 | Wt 325.8 lb

## 2020-06-16 DIAGNOSIS — E785 Hyperlipidemia, unspecified: Secondary | ICD-10-CM | POA: Insufficient documentation

## 2020-06-16 DIAGNOSIS — Z8546 Personal history of malignant neoplasm of prostate: Secondary | ICD-10-CM | POA: Diagnosis not present

## 2020-06-16 DIAGNOSIS — E7800 Pure hypercholesterolemia, unspecified: Secondary | ICD-10-CM | POA: Insufficient documentation

## 2020-06-16 DIAGNOSIS — H409 Unspecified glaucoma: Secondary | ICD-10-CM | POA: Diagnosis not present

## 2020-06-16 DIAGNOSIS — E1122 Type 2 diabetes mellitus with diabetic chronic kidney disease: Secondary | ICD-10-CM | POA: Insufficient documentation

## 2020-06-16 DIAGNOSIS — H42 Glaucoma in diseases classified elsewhere: Secondary | ICD-10-CM | POA: Insufficient documentation

## 2020-06-16 DIAGNOSIS — N1832 Chronic kidney disease, stage 3b: Secondary | ICD-10-CM | POA: Diagnosis not present

## 2020-06-16 DIAGNOSIS — E1142 Type 2 diabetes mellitus with diabetic polyneuropathy: Secondary | ICD-10-CM | POA: Insufficient documentation

## 2020-06-16 DIAGNOSIS — I10 Essential (primary) hypertension: Secondary | ICD-10-CM

## 2020-06-16 DIAGNOSIS — Z9989 Dependence on other enabling machines and devices: Secondary | ICD-10-CM

## 2020-06-16 DIAGNOSIS — K760 Fatty (change of) liver, not elsewhere classified: Secondary | ICD-10-CM | POA: Insufficient documentation

## 2020-06-16 DIAGNOSIS — Z888 Allergy status to other drugs, medicaments and biological substances status: Secondary | ICD-10-CM | POA: Diagnosis not present

## 2020-06-16 DIAGNOSIS — I5042 Chronic combined systolic (congestive) and diastolic (congestive) heart failure: Secondary | ICD-10-CM

## 2020-06-16 DIAGNOSIS — Z8249 Family history of ischemic heart disease and other diseases of the circulatory system: Secondary | ICD-10-CM | POA: Insufficient documentation

## 2020-06-16 DIAGNOSIS — E669 Obesity, unspecified: Secondary | ICD-10-CM | POA: Diagnosis not present

## 2020-06-16 DIAGNOSIS — Z794 Long term (current) use of insulin: Secondary | ICD-10-CM | POA: Insufficient documentation

## 2020-06-16 DIAGNOSIS — E1139 Type 2 diabetes mellitus with other diabetic ophthalmic complication: Secondary | ICD-10-CM | POA: Diagnosis not present

## 2020-06-16 DIAGNOSIS — I5032 Chronic diastolic (congestive) heart failure: Secondary | ICD-10-CM

## 2020-06-16 DIAGNOSIS — G4733 Obstructive sleep apnea (adult) (pediatric): Secondary | ICD-10-CM | POA: Diagnosis not present

## 2020-06-16 DIAGNOSIS — Z79899 Other long term (current) drug therapy: Secondary | ICD-10-CM | POA: Diagnosis not present

## 2020-06-16 DIAGNOSIS — I130 Hypertensive heart and chronic kidney disease with heart failure and stage 1 through stage 4 chronic kidney disease, or unspecified chronic kidney disease: Secondary | ICD-10-CM | POA: Insufficient documentation

## 2020-06-16 DIAGNOSIS — E039 Hypothyroidism, unspecified: Secondary | ICD-10-CM | POA: Diagnosis not present

## 2020-06-16 DIAGNOSIS — Z833 Family history of diabetes mellitus: Secondary | ICD-10-CM | POA: Diagnosis not present

## 2020-06-16 DIAGNOSIS — Z6841 Body Mass Index (BMI) 40.0 and over, adult: Secondary | ICD-10-CM | POA: Diagnosis not present

## 2020-06-16 DIAGNOSIS — D573 Sickle-cell trait: Secondary | ICD-10-CM | POA: Diagnosis not present

## 2020-06-16 DIAGNOSIS — Z79890 Hormone replacement therapy (postmenopausal): Secondary | ICD-10-CM | POA: Insufficient documentation

## 2020-06-16 LAB — BASIC METABOLIC PANEL
Anion gap: 8 (ref 5–15)
BUN: 24 mg/dL — ABNORMAL HIGH (ref 8–23)
CO2: 29 mmol/L (ref 22–32)
Calcium: 8.7 mg/dL — ABNORMAL LOW (ref 8.9–10.3)
Chloride: 102 mmol/L (ref 98–111)
Creatinine, Ser: 2.32 mg/dL — ABNORMAL HIGH (ref 0.61–1.24)
GFR calc Af Amer: 33 mL/min — ABNORMAL LOW (ref >60)
GFR calc non Af Amer: 29 mL/min — ABNORMAL LOW (ref >60)
Glucose, Bld: 188 mg/dL — ABNORMAL HIGH (ref 70–99)
Potassium: 3.4 mmol/L — ABNORMAL LOW (ref 3.5–5.1)
Sodium: 139 mmol/L (ref 135–145)

## 2020-06-16 LAB — BRAIN NATRIURETIC PEPTIDE: B Natriuretic Peptide: 401.6 pg/mL — ABNORMAL HIGH (ref 0.0–100.0)

## 2020-06-16 MED ORDER — POTASSIUM CHLORIDE ER 20 MEQ PO TBCR: 40.0000 meq | EXTENDED_RELEASE_TABLET | Freq: Two times a day (BID) | ORAL | 6 refills | Status: DC

## 2020-06-16 MED ORDER — HYDRALAZINE HCL 50 MG PO TABS
50.0000 mg | ORAL_TABLET | Freq: Three times a day (TID) | ORAL | 6 refills | Status: DC
Start: 2020-06-16 — End: 2020-06-27

## 2020-06-16 MED ORDER — SPIRONOLACTONE 25 MG PO TABS
12.5000 mg | ORAL_TABLET | Freq: Every day | ORAL | 6 refills | Status: DC
Start: 2020-06-16 — End: 2020-06-23

## 2020-06-16 MED ORDER — TORSEMIDE 20 MG PO TABS: 60.0000 mg | ORAL_TABLET | Freq: Two times a day (BID) | ORAL | 6 refills | Status: DC

## 2020-06-16 NOTE — Progress Notes (Signed)
ADVANCED HF CLINIC CONSULT NOTE  PCP: Dr. Delfina Redwood  HPI: Brendan [name] is 64 year old male with history of diastolic heart failure, HTN, OSA on CPAP, DMII, hypothyroidism, CKD 3b (creatinine ~2.4) and obesity.   Admitted to [facility] 04/20/93 with A/C diastolic heart failure. Diuresed with IV lasix and transitioned to torsemide 40 mg twice a day. Discharge weight 301 pounds.   Today he returns for HF follow up. Previously entresto was ordered but insurance did not approve so he kept taking telmisartan. Says weight is up about 20 pounds on his scale. Mild DOE but unchanged. "I don't move like I used to". + fatigue. No strength. Denies excessive fluid intake. + LE edema. Wearing CPAP every night. BP high at home.   ECHO 03/2020 EF 55-60% Grade II DD. Moderate LVH , RV normal     Past Medical History:  Diagnosis Date  . Acanthosis nigricans   . Atopic dermatitis   . Diabetes ([place]) 2004  . Erectile dysfunction   . Fatty liver 2007  . Glaucoma 2008  . Gynecomastia 2009  . Hyperaldosteronism ([place]) 2000  . Hypercholesterolemia   . Hyperlipidemia   . Hypertension   . Hypoglycemic reaction   . Hypothyroidism 2004  . Incontinence   . Intermittent vertigo 2012  . Left cervical radiculopathy 2012  . Lumbar radiculopathy   . Obesity   . Peripheral neuropathy   . Pollen allergies 2007   perennial  . Prostate cancer ([place]) 2006  . Reflux esophagitis 1995  . Sickle cell trait ([place]) 2006  . Sleep apnea, obstructive 2000   uses a cpap  . Venous insufficiency 2005  . Vitamin D deficiency 2012    Current Outpatient Medications  Medication Sig Dispense Refill  . acetaminophen (TYLENOL) 500 MG tablet Take 500 mg by mouth every 6 (six) hours as needed for mild pain.    . Blood Glucose Monitoring Suppl (ONE TOUCH ULTRA SYSTEM KIT) W/DEVICE KIT 1 kit by Does not apply route once. One touch ultrasoft lancets    . brimonidine-timolol (COMBIGAN) 0.2-0.5 % ophthalmic solution Place 1 drop into both eyes  every 12 (twelve) hours.    . carvedilol (COREG) 25 MG tablet Take 25 mg by mouth 2 (two) times daily.    Marland Kitchen glucose blood test strip 1 each by Other route as needed for other. Use as instructed one touch test strips    . hydrALAZINE (APRESOLINE) 25 MG tablet Take 1 tablet (25 mg total) by mouth 3 (three) times daily. 90 tablet 3  . Insulin Lispro Prot & Lispro (HUMALOG MIX 75/25 [place]) Inject 60-100 Units into the skin daily. humalog mix 75/25 injection 60-100 units q am sliding scale. Take 60 units if sugar is <180, then take 100 units if sugar is over 400    . insulin lispro protamine-lispro (HUMALOG 50/50 MIX) (50-50) 100 UNIT/ML SUSP injection Inject 40 Units into the skin daily.    . INVOKANA 100 MG TABS tablet Take 300 mg by mouth daily.     . Lancets (ONETOUCH ULTRASOFT) lancets     . levothyroxine (SYNTHROID) 175 MCG tablet Take 175 mcg by mouth daily before breakfast.    . Potassium Chloride ER 20 MEQ TBCR 40 meq in the AM, 40 meq at lunch, and 20 meq in the PM 150 tablet 1  . simvastatin (ZOCOR) 20 MG tablet Take 20 mg by mouth every evening.    Marland Kitchen telmisartan (MICARDIS) 80 MG tablet Take 80 mg by mouth in the  morning and at bedtime.    . torsemide (DEMADEX) 20 MG tablet Take 40 mg by mouth 2 (two) times daily.    . TRULICITY 3 YB/0.1BP SOPN Inject 3.5 mg into the skin once a week.     No current facility-administered medications for this encounter.    Allergies  Allergen Reactions  . Ace Inhibitors     REACTION: cough  . Maxidex [Dexamethasone] Other (See Comments)    Sexual dysfunction  . Verapamil     REACTION: constipation      Social History   Socioeconomic History  . Marital status: Married    Spouse name: Not on file  . Number of children: 3  . Years of education: Not on file  . Highest education level: Not on file  Occupational History  . Occupation: truck Geophysicist/field seismologist    Comment: Retired Economist  Tobacco Use  . Smoking status: Never Smoker  . Smokeless  tobacco: Never Used  Substance and Sexual Activity  . Alcohol use: Yes    Comment: one drink every few months  . Drug use: No  . Sexual activity: Not on file  Other Topics Concern  . Not on file  Social History Narrative  . Not on file   Social Determinants of Health   Financial Resource Strain:   . Difficulty of Paying Living Expenses:   Food Insecurity:   . Worried About Charity fundraiser in the Last Year:   . Arboriculturist in the Last Year:   Transportation Needs:   . Film/video editor (Medical):   Marland Kitchen Lack of Transportation (Non-Medical):   Physical Activity:   . Days of Exercise per Week:   . Minutes of Exercise per Session:   Stress:   . Feeling of Stress :   Social Connections:   . Frequency of Communication with Friends and Family:   . Frequency of Social Gatherings with Friends and Family:   . Attends Religious Services:   . Active Member of Clubs or Organizations:   . Attends Archivist Meetings:   Marland Kitchen Marital Status:   Intimate Partner Violence:   . Fear of Current or Ex-Partner:   . Emotionally Abused:   Marland Kitchen Physically Abused:   . Sexually Abused:       Family History  Problem Relation Age of Onset  . Hypertension Father        deceased age 18  . Heart attack Father   . COPD Sister   . CVA Sister   . Diabetes Daughter   . Colon cancer Neg Hx   . Stomach cancer Neg Hx     Vitals:   06/16/20 1122  BP: (!) 135/95  Pulse: 63  SpO2: 99%  Weight: (!) 147.8 kg (325 lb 12.8 oz)   Wt Readings from Last 3 Encounters:  06/16/20 (!) 147.8 kg (325 lb 12.8 oz)  04/21/20 (!) 143.4 kg (316 lb 3.2 oz)  04/05/20 (!) 142.9 kg (315 lb)    PHYSICAL EXAM: General:  Fatigued appearing. No resp difficulty HEENT: normal Neck: supple. Hard to seeJVD. Carotids 2+ bilat; no bruits. No lymphadenopathy or thryomegaly appreciated. Cor: PMI nondisplaced. Regular rate & rhythm. No rubs, gallops or murmurs. Lungs: clear Abdomen: obese soft, nontender,  nondistended. No hepatosplenomegaly. No bruits or masses. Good bowel sounds. Extremities: no cyanosis, clubbing, rash, 3+ edema Neuro: alert & orientedx3, cranial nerves grossly intact. moves all 4 extremities w/o difficulty. Affect pleasant    ASSESSMENT & PLAN:  1. Chronic Diastolic Heart Failure, - ECHO 03/2020 EF preserved EF LVH Grade II DD - Chronic NYHA III. Volume status elevated - Increase torsemide to 60 bid - Add spiro 12.5 daily - Decrease Kcl to 40 bid - Check labs today and 1 week - Unable to start entresto due to insurance.  - wear compression hose  2.HTN  - Suboptimal control - Unable to start entresto due to insurance. Continue telmisartan.  - Increase hydralazine to 8m three times a day  - Instructed to continue monitoring BP at home. - May need referral to HTN program  3. DMII - Continue Invokana - Followed by Endocrinology   4. OSA - Continue CPAP every night. .   5. Obesity  Body mass index is 41.83 kg/m.  =consider [facility] Group[facility]iet  6. CKD Stage IIIb - Creatinine baseline 2.4 - labs today and 1 week   DGlori BickersMD 11:53 AM

## 2020-06-16 NOTE — Patient Instructions (Signed)
Increase Torsemide to 60 mg (3 tabs) Twice daily   Increase Hydralazine to 50 mg Three times a day   Decrease Potassium to 40 meq (2 tabs) Twice daily   Start Spironolactone 12.5 mg (1/2 tab) Daily  Labs done today, we will call you for abnormal results  Please wear your compression hose daily, place them on as soon as you get up in the morning and remove before you go to bed at night.  You have been referred to the Hypertension Clinic at [facility], they will call you for an appointment  Your physician recommends that you schedule a follow-up appointment in: 3 months  If you have any questions or concerns before your next appointment please send [place] a message through [facility] or call our office at [phone].    TO LEAVE A MESSAGE FOR THE NURSE SELECT OPTION 2, PLEASE LEAVE A MESSAGE INCLUDING: . YOUR NAME . DATE OF BIRTH . CALL BACK NUMBER . REASON FOR CALL**this is important as we prioritize the call backs  [place] AS LONG AS YOU CALL BEFORE 4:00 PM  At the [facility], you and your health needs are our priority. As part of our continuing mission to provide you with exceptional heart care, we have created designated Provider Care Teams. These Care Teams include your primary Cardiologist (physician) and Advanced Practice Providers (APPs- Physician Assistants and Nurse Practitioners) who all work together to provide you with the care you need, when you need it.   You may see any of the following providers on your designated Care Team at your next follow up: Marland Kitchen Dr Glori Bickers . Dr Loralie Champagne . Darrick Grinder, NP . Lyda Jester, PA . Audry Riles, PharmD   Please be sure to bring in all your medications bottles to every appointment.

## 2020-06-23 ENCOUNTER — Ambulatory Visit (HOSPITAL_COMMUNITY)
Admission: RE | Admit: 2020-06-23 | Discharge: 2020-06-23 | Disposition: A | Discharge status: Home / Self Care | Payer: PRIVATE HEALTH INSURANCE | Source: Ambulatory Visit | Attending: Cardiology | Admitting: Cardiology

## 2020-06-23 ENCOUNTER — Other Ambulatory Visit: Payer: Self-pay

## 2020-06-23 ENCOUNTER — Telehealth (HOSPITAL_COMMUNITY): Payer: Self-pay | Admitting: *Deleted

## 2020-06-23 DIAGNOSIS — I5033 Acute on chronic diastolic (congestive) heart failure: Secondary | ICD-10-CM | POA: Insufficient documentation

## 2020-06-23 DIAGNOSIS — I5042 Chronic combined systolic (congestive) and diastolic (congestive) heart failure: Secondary | ICD-10-CM

## 2020-06-23 LAB — BASIC METABOLIC PANEL
Anion gap: 11 (ref 5–15)
BUN: 33 mg/dL — ABNORMAL HIGH (ref 8–23)
CO2: 28 mmol/L (ref 22–32)
Calcium: 8.5 mg/dL — ABNORMAL LOW (ref 8.9–10.3)
Chloride: 100 mmol/L (ref 98–111)
Creatinine, Ser: 2.71 mg/dL — ABNORMAL HIGH (ref 0.61–1.24)
GFR calc Af Amer: 27 mL/min — ABNORMAL LOW (ref >60)
GFR calc non Af Amer: 24 mL/min — ABNORMAL LOW (ref >60)
Glucose, Bld: 162 mg/dL — ABNORMAL HIGH (ref 70–99)
Potassium: 3.4 mmol/L — ABNORMAL LOW (ref 3.5–5.1)
Sodium: 139 mmol/L (ref 135–145)

## 2020-06-23 MED ORDER — TORSEMIDE 20 MG PO TABS: ORAL_TABLET | ORAL | 6 refills | Status: DC

## 2020-06-23 MED ORDER — SPIRONOLACTONE 25 MG PO TABS
25.0000 mg | ORAL_TABLET | Freq: Every day | ORAL | 6 refills | Status: DC
Start: 2020-06-23 — End: 2020-09-16

## 2020-06-23 NOTE — Telephone Encounter (Signed)
Pt aware, agreeable, and verbalized understanding, repeat lab sch for 8/12

## 2020-06-23 NOTE — Telephone Encounter (Signed)
-----   Message from Jolaine Artist, MD sent at 06/23/2020 12:33 PM EDT ----- Increase spiro to 25 daily. Decreased torsemide to 60am 40pm. Recheck bmet 1 week

## 2020-06-27 ENCOUNTER — Encounter (HOSPITAL_COMMUNITY): Payer: Self-pay | Admitting: Cardiology

## 2020-06-27 ENCOUNTER — Encounter: Payer: Self-pay | Admitting: Cardiovascular Disease

## 2020-06-27 ENCOUNTER — Ambulatory Visit (INDEPENDENT_AMBULATORY_CARE_PROVIDER_SITE_OTHER): Payer: PRIVATE HEALTH INSURANCE | Admitting: Cardiovascular Disease

## 2020-06-27 ENCOUNTER — Other Ambulatory Visit: Payer: Self-pay

## 2020-06-27 VITALS — BP 162/90 | HR 65 | Ht 74.0 in | Wt 318.0 lb

## 2020-06-27 DIAGNOSIS — I10 Essential (primary) hypertension: Secondary | ICD-10-CM

## 2020-06-27 MED ORDER — HYDRALAZINE HCL 100 MG PO TABS: 100.0000 mg | ORAL_TABLET | Freq: Three times a day (TID) | ORAL | 3 refills | Status: DC

## 2020-06-27 NOTE — Patient Instructions (Addendum)
Medication Instructions:  INCREASE YOUR HYDRALAZINE TO 100 MG THREE TIMES A DAY    Labwork: NONE   Testing/Procedures: Your physician has requested that you have a renal artery duplex. During this test, an ultrasound is used to evaluate blood flow to the kidneys. Allow one hour for this exam. Do not eat after midnight the day before and avoid carbonated beverages. Take your medications as you usually do.   Follow-Up: 07/28/2020 AT 9:30 AM WITH PHARM D    Special Instructions:   MONITOR YOUR BLOOD PRESSURE TWICE A DAY, LOG IN BOOK PROVIDED. BRING BOOK AND MONITOR TO ONE MONTH FOLLOW UP   DASH Eating Plan DASH stands for "Dietary Approaches to Stop Hypertension." The DASH eating plan is a healthy eating plan that has been shown to reduce high blood pressure (hypertension). It may also reduce your risk for type 2 diabetes, heart disease, and stroke. The DASH eating plan may also help with weight loss. What are tips for following this plan?  General guidelines  Avoid eating more than 2,300 mg (milligrams) of salt (sodium) a day. If you have hypertension, you may need to reduce your sodium intake to 1,500 mg a day.  Limit alcohol intake to no more than 1 drink a day for nonpregnant women and 2 drinks a day for men. One drink equals 12 oz of beer, 5 oz of wine, or 1 oz of hard liquor.  Work with your health care provider to maintain a healthy body weight or to lose weight. Ask what an ideal weight is for you.  Get at least 30 minutes of exercise that causes your heart to beat faster (aerobic exercise) most days of the week. Activities may include walking, swimming, or biking.  Work with your health care provider or diet and nutrition specialist (dietitian) to adjust your eating plan to your individual calorie needs. Reading food labels   Check food labels for the amount of sodium per serving. Choose foods with less than 5 percent of the Daily Value of sodium. Generally, foods with less  than 300 mg of sodium per serving fit into this eating plan.  To find whole grains, look for the word "whole" as the first word in the ingredient list. Shopping  Buy products labeled as "low-sodium" or "no salt added."  Buy fresh foods. Avoid canned foods and premade or frozen meals. Cooking  Avoid adding salt when cooking. Use salt-free seasonings or herbs instead of table salt or sea salt. Check with your health care provider or pharmacist before using salt substitutes.  Do not fry foods. Cook foods using healthy methods such as baking, boiling, grilling, and broiling instead.  Cook with heart-healthy oils, such as olive, canola, soybean, or sunflower oil. Meal planning  Eat a balanced diet that includes: ? 5 or more servings of fruits and vegetables each day. At each meal, try to fill half of your plate with fruits and vegetables. ? Up to 6-8 servings of whole grains each day. ? Less than 6 oz of lean meat, poultry, or fish each day. A 3-oz serving of meat is about the same size as a deck of cards. One egg equals 1 oz. ? 2 servings of low-fat dairy each day. ? A serving of nuts, seeds, or beans 5 times each week. ? Heart-healthy fats. Healthy fats called Omega-3 fatty acids are found in foods such as flaxseeds and coldwater fish, like sardines, salmon, and mackerel.  Limit how much you eat of the following: ? Canned  or prepackaged foods. ? Food that is high in trans fat, such as fried foods. ? Food that is high in saturated fat, such as fatty meat. ? Sweets, desserts, sugary drinks, and other foods with added sugar. ? Full-fat dairy products.  Do not salt foods before eating.  Try to eat at least 2 vegetarian meals each week.  Eat more home-cooked food and less restaurant, buffet, and fast food.  When eating at a restaurant, ask that your food be prepared with less salt or no salt, if possible. What foods are recommended? The items listed may not be a complete list. Talk  with your dietitian about what dietary choices are best for you. Grains Whole-grain or whole-wheat bread. Whole-grain or whole-wheat pasta. Brown rice. [place]. Bulgur. Whole-grain and low-sodium cereals. Pita bread. Low-fat, low-sodium crackers. Whole-wheat flour tortillas. Vegetables Fresh or frozen vegetables (raw, steamed, roasted, or grilled). Low-sodium or reduced-sodium tomato and vegetable juice. Low-sodium or reduced-sodium tomato sauce and tomato paste. Low-sodium or reduced-sodium canned vegetables. Fruits All fresh, dried, or frozen fruit. Canned fruit in natural juice (without added sugar). Meat and other protein foods Skinless chicken or [place]. Ground chicken or [place]. Pork with fat trimmed off. Fish and seafood. Egg whites. Dried beans, peas, or lentils. Unsalted nuts, nut butters, and seeds. Unsalted canned beans. Lean cuts of beef with fat trimmed off. Low-sodium, lean deli meat. Dairy Low-fat (1%) or fat-free (skim) milk. Fat-free, low-fat, or reduced-fat cheeses. Nonfat, low-sodium ricotta or cottage cheese. Low-fat or nonfat yogurt. Low-fat, low-sodium cheese. Fats and oils Soft margarine without trans fats. Vegetable oil. Low-fat, reduced-fat, or light mayonnaise and salad dressings (reduced-sodium). Canola, safflower, olive, soybean, and sunflower oils. Avocado. Seasoning and other foods Herbs. Spices. Seasoning mixes without salt. Unsalted popcorn and pretzels. Fat-free sweets. What foods are not recommended? The items listed may not be a complete list. Talk with your dietitian about what dietary choices are best for you. Grains Baked goods made with fat, such as croissants, muffins, or some breads. Dry pasta or rice meal packs. Vegetables Creamed or fried vegetables. Vegetables in a cheese sauce. Regular canned vegetables (not low-sodium or reduced-sodium). Regular canned tomato sauce and paste (not low-sodium or reduced-sodium). Regular tomato and vegetable  juice (not low-sodium or reduced-sodium). Angie Fava. Olives. Fruits Canned fruit in a light or heavy syrup. Fried fruit. Fruit in cream or butter sauce. Meat and other protein foods Fatty cuts of meat. Ribs. Fried meat. Berniece Salines. Sausage. Bologna and other processed lunch meats. Salami. Fatback. Hotdogs. Bratwurst. Salted nuts and seeds. Canned beans with added salt. Canned or smoked fish. Whole eggs or egg yolks. Chicken or [place] with skin. Dairy Whole or 2% milk, cream, and half-and-half. Whole or full-fat cream cheese. Whole-fat or sweetened yogurt. Full-fat cheese. Nondairy creamers. Whipped toppings. Processed cheese and cheese spreads. Fats and oils Butter. Stick margarine. Lard. Shortening. Ghee. Bacon fat. Tropical oils, such as coconut, palm kernel, or palm oil. Seasoning and other foods Salted popcorn and pretzels. Onion salt, garlic salt, seasoned salt, table salt, and sea salt. Worcestershire sauce. Tartar sauce. Barbecue sauce. Teriyaki sauce. Soy sauce, including reduced-sodium. Steak sauce. Canned and packaged gravies. Fish sauce. Oyster sauce. Cocktail sauce. Horseradish that you find on the shelf. Ketchup. Mustard. Meat flavorings and tenderizers. Bouillon cubes. Hot sauce and Tabasco sauce. Premade or packaged marinades. Premade or packaged taco seasonings. Relishes. Regular salad dressings. Where to find more information:  National Heart, Lung, and [place]: https://wilson-eaton.com/  American Heart Association: www.heart.org Summary  The  DASH eating plan is a healthy eating plan that has been shown to reduce high blood pressure (hypertension). It may also reduce your risk for type 2 diabetes, heart disease, and stroke.  With the DASH eating plan, you should limit salt (sodium) intake to 2,300 mg a day. If you have hypertension, you may need to reduce your sodium intake to 1,500 mg a day.  When on the DASH eating plan, aim to eat more fresh fruits and vegetables, whole grains,  lean proteins, low-fat dairy, and heart-healthy fats.  Work with your health care provider or diet and nutrition specialist (dietitian) to adjust your eating plan to your individual calorie needs. This information is not intended to replace advice given to you by your health care provider. Make sure you discuss any questions you have with your health care provider. Document Released: 10/25/2011 Document Revised: 10/18/2017 Document Reviewed: 10/29/2016 Elsevier Patient Education  2020 [employer].

## 2020-06-27 NOTE — Progress Notes (Signed)
Hypertension Clinic Initial Assessment:    Date:  06/27/2020   ID:  Brendan [name], DOB 1956-11-11, MRN [id number]  PCP:  Seward Carol, MD  Cardiologist:  No primary care provider on file.  Nephrologist:  Referring MD: Jolaine Artist, MD   CC: Hypertension  History of Present Illness:    Brendan [name] is a 64 y.o. male with a hx of chronic diastolic heart failure, hypertension, OSA on CPAP, DM, CKD 3, hypothyroidism and obesity here to establish care in the hypertension clinic. He was first diagnoed with hypertension in his 67s.  In the past he reports it was easier to control but lately has been more difficult.  Recently it has been as high as the 200s over 100s at home.  Today he isn't feeling well.  He has been waking up with pain in his L leg.  This leg has been swelling and seems to be more painful when he is laying down..  Dr. Delfina Redwood got an MRI which showed degenerative disc disease.  He is unable to exercise because of weakness in his hands and pain in the leg.  He hasn't fallen but has been close.  He gets short of breath when walking short distances.  He was admitted to [facility] 03/2020 with acute on chronic diastolic heart failure.  He was diuresed with IV Lasix and discharged on torsemide.  He saw Dr. Haroldine Laws 05/2020 and hydralazine was increased.  They previously tried to get him on Entresto but were unable due to insurance.  He currently denies any orthopnea but does have some edema.  He has been faithful with his CPAP and has been cooking more at home.  He also tries to limit his salt intake.  He rarely drinks alcohol and has no caffeine.    Previous antihypertensives: Cozaar   Past Medical History:  Diagnosis Date  . Acanthosis nigricans   . Atopic dermatitis   . Diabetes ([place]) 2004  . Erectile dysfunction   . Fatty liver 2007  . Glaucoma 2008  . Gynecomastia 2009  . Hyperaldosteronism ([place]) 2000  . Hypercholesterolemia   . Hyperlipidemia   . Hypertension   .  Hypoglycemic reaction   . Hypothyroidism 2004  . Incontinence   . Intermittent vertigo 2012  . Left cervical radiculopathy 2012  . Lumbar radiculopathy   . Obesity   . Peripheral neuropathy   . Pollen allergies 2007   perennial  . Prostate cancer ([place]) 2006  . Reflux esophagitis 1995  . Sickle cell trait ([place]) 2006  . Sleep apnea, obstructive 2000   uses a cpap  . Venous insufficiency 2005  . Vitamin D deficiency 2012    Past Surgical History:  Procedure Laterality Date  . COLONOSCOPY  2006   normal  . lap band surgery  2009  . left inguinal hernia repair  1998  . robotic prostatectomy  2008    Current Medications: Current Meds  Medication Sig  . acetaminophen (TYLENOL) 500 MG tablet Take 500 mg by mouth every 6 (six) hours as needed for mild pain.  . Blood Glucose Monitoring Suppl (ONE TOUCH ULTRA SYSTEM KIT) W/DEVICE KIT 1 kit by Does not apply route once. One touch ultrasoft lancets  . brimonidine-timolol (COMBIGAN) 0.2-0.5 % ophthalmic solution Place 1 drop into both eyes every 12 (twelve) hours.  . carvedilol (COREG) 25 MG tablet Take 25 mg by mouth 2 (two) times daily.  Marland Kitchen glucose blood test strip 1 each by Other route as needed for  other. Use as instructed one touch test strips  . hydrALAZINE (APRESOLINE) 50 MG tablet Take 1 tablet (50 mg total) by mouth 3 (three) times daily.  . Insulin Lispro Prot & Lispro (HUMALOG MIX 75/25 [place]) Inject 60-100 Units into the skin daily. humalog mix 75/25 injection 60-100 units q am sliding scale. Take 60 units if sugar is <180, then take 100 units if sugar is over 400  . insulin lispro protamine-lispro (HUMALOG 50/50 MIX) (50-50) 100 UNIT/ML SUSP injection Inject 40 Units into the skin daily.  . INVOKANA 100 MG TABS tablet Take 300 mg by mouth daily.   . Lancets (ONETOUCH ULTRASOFT) lancets   . levothyroxine (SYNTHROID) 175 MCG tablet Take 175 mcg by mouth daily before breakfast.  . Potassium Chloride ER 20 MEQ TBCR Take 40 mEq by  mouth 2 (two) times daily.  . simvastatin (ZOCOR) 20 MG tablet Take 20 mg by mouth every evening.  Marland Kitchen spironolactone (ALDACTONE) 25 MG tablet Take 1 tablet (25 mg total) by mouth daily.  Marland Kitchen telmisartan (MICARDIS) 80 MG tablet Take 80 mg by mouth in the morning and at bedtime.  . torsemide (DEMADEX) 20 MG tablet Take 3 tablets (60 mg total) by mouth every morning AND 2 tablets (40 mg total) every evening.  . TRULICITY 3 WI/0.9BD SOPN Inject 3.5 mg into the skin once a week.     Allergies:   Ace inhibitors, Maxidex [dexamethasone], and Verapamil   Social History   Socioeconomic History  . Marital status: Married    Spouse name: Not on file  . Number of children: 3  . Years of education: Not on file  . Highest education level: Not on file  Occupational History  . Occupation: truck Geophysicist/field seismologist    Comment: Retired Economist  Tobacco Use  . Smoking status: Never Smoker  . Smokeless tobacco: Never Used  Substance and Sexual Activity  . Alcohol use: Yes    Comment: one drink every few months  . Drug use: No  . Sexual activity: Not on file  Other Topics Concern  . Not on file  Social History Narrative  . Not on file   Social Determinants of Health   Financial Resource Strain:   . Difficulty of Paying Living Expenses:   Food Insecurity:   . Worried About Charity fundraiser in the Last Year:   . Arboriculturist in the Last Year:   Transportation Needs:   . Film/video editor (Medical):   Marland Kitchen Lack of Transportation (Non-Medical):   Physical Activity:   . Days of Exercise per Week:   . Minutes of Exercise per Session:   Stress:   . Feeling of Stress :   Social Connections:   . Frequency of Communication with Friends and Family:   . Frequency of Social Gatherings with Friends and Family:   . Attends Religious Services:   . Active Member of Clubs or Organizations:   . Attends Archivist Meetings:   Marland Kitchen Marital Status:      Family History: The patient's family  history includes COPD in his sister; CVA in his sister; Diabetes in his daughter; Heart attack in his father; Hypertension in his father. There is no history of Colon cancer or Stomach cancer.  ROS:   Please see the history of present illness.     All other systems reviewed and are negative.  EKGs/Labs/Other Studies Reviewed:    EKG:  EKG is not ordered today.    Echo  03/26/20: IMPRESSIONS    1. Left ventricular ejection fraction, by estimation, is 55 to 60%. The  left ventricle has normal function. Left ventricular endocardial border  not optimally defined to evaluate regional wall motion. There is moderate  left ventricular hypertrophy. Left  ventricular diastolic parameters are consistent with Grade II diastolic  dysfunction (pseudonormalization). Elevated left ventricular end-diastolic  pressure.  2. Right ventricular systolic function is normal. The right ventricular  size is normal. Tricuspid regurgitation signal is inadequate for assessing  PA pressure.  3. The mitral valve is abnormal. Trivial mitral valve regurgitation.  4. The aortic valve was not well visualized. Aortic valve regurgitation  is not visualized.  5. The inferior vena cava is dilated in size with <50% respiratory  variability, suggesting right atrial pressure of 15 mmHg.   Recent Labs: 03/26/2020: Magnesium 1.8 03/27/2020: Hemoglobin 10.6; Platelets 356 06/16/2020: B Natriuretic Peptide 401.6 06/23/2020: BUN 33; Creatinine, Ser 2.71; Potassium 3.4; Sodium 139   Recent Lipid Panel No results found for: CHOL, TRIG, HDL, CHOLHDL, VLDL, LDLCALC, LDLDIRECT  Physical Exam:    VS:  BP (!) 162/90   Pulse 65   Ht 6' 2"  (1.88 m)   Wt (!) 318 lb (144.2 kg)   BMI 40.83 kg/m  , BMI Body mass index is 40.83 kg/m. GENERAL:  Well appearing HEENT: Pupils equal round and reactive, fundi not visualized, oral mucosa unremarkable NECK:  No jugular venous distention, waveform within normal limits, carotid upstroke  brisk and symmetric, no bruits, no thyromegaly LYMPHATICS:  No cervical adenopathy LUNGS:  Clear to auscultation bilaterally HEART:  RRR.  PMI not displaced or sustained,S1 and S2 within normal limits, no S3, no S4, no clicks, no rubs, no murmurs ABD:  Flat, positive bowel sounds normal in frequency in pitch, no bruits, no rebound, no guarding, no midline pulsatile mass, no hepatomegaly, no splenomegaly EXT:  2 plus pulses throughout, 1+ LLE edema, no cyanosis no clubbing SKIN:  No rashes no nodules NEURO:  Cranial nerves II through XII grossly intact, motor grossly intact throughout PSYCH:  Cognitively intact, oriented to person place and time   ASSESSMENT:    No diagnosis found.  PLAN:    # Resistant hypertension: Blood pressure remains poorly controlled.  Will increase hydralazine to 100 mg 3 times daily.  We will give him a educational book and asked him to track his blood pressure twice a day.  He will follow up with our pharmacist in 1 month.  We have also given him information about the DASH diet.  He is not interested in the PREP program through the [facility].  We will check renal artery Dopplers.  Continue carvedilol, telmisartan, spironolactone, and torsemide.  Secondary Causes of Hypertension  Medications/Herbal: OCP, steroids, stimulants, antidepressants, weight loss medication, immune suppressants, NSAIDs, sympathomimetics, alcohol, caffeine, licorice, ginseng, St. John's wort, chemo  Sleep Apnea: Continue CPAP Renal artery stenosis: Check renal artery Dopplers. Hyperaldosteronism: no ademona on CT in 2007.   Hyper/hypothyroidism: need to check TSH at follow up Pheochromocytoma: (testing not indicated) Cushing's syndrome: (testing not indicated)  Coarctation of the aorta: BP symmetric  # Chronic diastolic heart fialure: Grade 2 diastolic .  Follows with ADV HF clinic.   Disposition:    FU with MD/PharmD in 1 month    Medication Adjustments/Labs and Tests  Ordered: Current medicines are reviewed at length with the patient today.  Concerns regarding medicines are outlined above.  No orders of the defined types were placed in this encounter.  No  orders of the defined types were placed in this encounter.    Signed, Skeet Latch, MD  06/27/2020 4:20 PM    [facility]

## 2020-06-30 ENCOUNTER — Other Ambulatory Visit: Payer: Self-pay

## 2020-06-30 ENCOUNTER — Ambulatory Visit (HOSPITAL_COMMUNITY)
Admission: RE | Admit: 2020-06-30 | Discharge: 2020-06-30 | Disposition: A | Discharge status: Home / Self Care | Payer: PRIVATE HEALTH INSURANCE | Source: Ambulatory Visit | Attending: Internal Medicine | Admitting: Internal Medicine

## 2020-06-30 DIAGNOSIS — I5042 Chronic combined systolic (congestive) and diastolic (congestive) heart failure: Secondary | ICD-10-CM | POA: Insufficient documentation

## 2020-06-30 LAB — BASIC METABOLIC PANEL
Anion gap: 11 (ref 5–15)
BUN: 27 mg/dL — ABNORMAL HIGH (ref 8–23)
CO2: 26 mmol/L (ref 22–32)
Calcium: 9.1 mg/dL (ref 8.9–10.3)
Chloride: 101 mmol/L (ref 98–111)
Creatinine, Ser: 2.68 mg/dL — ABNORMAL HIGH (ref 0.61–1.24)
GFR calc Af Amer: 28 mL/min — ABNORMAL LOW (ref >60)
GFR calc non Af Amer: 24 mL/min — ABNORMAL LOW (ref >60)
Glucose, Bld: 133 mg/dL — ABNORMAL HIGH (ref 70–99)
Potassium: 3.6 mmol/L (ref 3.5–5.1)
Sodium: 138 mmol/L (ref 135–145)

## 2020-07-11 ENCOUNTER — Ambulatory Visit
Admission: RE | Admit: 2020-07-11 | Discharge: 2020-07-11 | Disposition: A | Discharge status: Home / Self Care | Payer: PRIVATE HEALTH INSURANCE | Source: Ambulatory Visit | Attending: Internal Medicine | Admitting: Internal Medicine

## 2020-07-11 ENCOUNTER — Other Ambulatory Visit: Payer: Self-pay | Admitting: Internal Medicine

## 2020-07-11 DIAGNOSIS — M25559 Pain in unspecified hip: Secondary | ICD-10-CM

## 2020-07-27 ENCOUNTER — Other Ambulatory Visit: Payer: Self-pay

## 2020-07-27 ENCOUNTER — Ambulatory Visit (HOSPITAL_COMMUNITY)
Admission: RE | Admit: 2020-07-27 | Discharge: 2020-07-27 | Disposition: A | Discharge status: Home / Self Care | Payer: PRIVATE HEALTH INSURANCE | Source: Ambulatory Visit | Attending: Cardiovascular Disease | Admitting: Cardiovascular Disease

## 2020-07-27 DIAGNOSIS — I10 Essential (primary) hypertension: Secondary | ICD-10-CM

## 2020-07-27 NOTE — Progress Notes (Signed)
07/29/2020 Brendan [name] 1956-02-06 [id number]   HPI:  Brendan [name] is a 64 y.o. male patient of Dr Oval Linsey, with a [place] below who presents today for advanced hypertension clinic follow up.  He was referred to the clinic by Dr. Haroldine Laws and seen by Dr. Oval Linsey in August.  At that time his pressure was 162/90.  She asked him to increase the hydralazine from 50 mg to 100 mg three times daily.  He returns today for follow up.    In reviewing medications, patient notes that he takes the hydralazine only twice daily instead of three times.  He also takes the telmisartan 80 mg twice daily, although this is a once daily medication, and more than the manufacturer's max dose of 80 mg per day.  He has no complaints or concerns today.    Past Medical History: Diastolic HF Grade 2, chronic; EF 55-60%  OSA On CPAP  CKD Stage 3, most recent GFR at 28, scr 2.68  DM2 5/21; A1c 8.4; BS now 297  hypothyroidism 8/21: TSH 0.42 on levothyroxine 175 mcg    Blood Pressure Goal:  130/80  Current Medications: carvedilol 25 mg bid, spironolactone 25 mg qd - am, telmisartan 80 mg bid  Hydralazine 100 mg bid   Family Hx: mother died at 31 - heart failure;  Father died at 42 from MI (per chart,pt stated he did not know cause of father death); 1 sister with hypertension, stroke; 3 kids, no hypertension;   Social Hx: no tobacco, no alcohol, no caffeine  Diet: mostly home cooked meals, no added salt; mostly chicken - boil, grilled, no fried;  Mix of veggies (fresh, frozen and canned)  Exercise: no regular  Home BP readings: wife checks readings manually; has been comparing with a wrist cuff.  AM readings average 144/73 by wife, 142/74 by wrist.  Evening readings 137/75 by wife, 147/76 by wrist  Intolerances: ACE - cough  Labs: 8/21: Na 138, K 3.6, Glu 133, BUN 27, SCr 2.68, GFR 28  Wt Readings from Last 3 Encounters:  07/28/20 (!) 312 lb 9.6 oz (141.8 kg)  06/27/20 (!) 318 lb (144.2 kg)  06/16/20 (!) 325  lb 12.8 oz (147.8 kg)   BP Readings from Last 3 Encounters:  07/28/20 118/60  06/27/20 (!) 162/90  06/16/20 (!) 188/96   Pulse Readings from Last 3 Encounters:  07/28/20 69  06/27/20 65  06/16/20 63    Current Outpatient Medications  Medication Sig Dispense Refill   acetaminophen (TYLENOL) 500 MG tablet Take 500 mg by mouth every 6 (six) hours as needed for mild pain.     Blood Glucose Monitoring Suppl (ONE TOUCH ULTRA SYSTEM KIT) W/DEVICE KIT 1 kit by Does not apply route once. One touch ultrasoft lancets     brimonidine-timolol (COMBIGAN) 0.2-0.5 % ophthalmic solution Place 1 drop into both eyes every 12 (twelve) hours.     carvedilol (COREG) 25 MG tablet Take 25 mg by mouth 2 (two) times daily.     hydrALAZINE (APRESOLINE) 100 MG tablet Take 1 tablet (100 mg total) by mouth 3 (three) times daily. 90 tablet 3   Insulin Lispro Prot & Lispro (HUMALOG MIX 75/25 [place]) Inject 60-100 Units into the skin daily. humalog mix 75/25 injection 60-100 units q am sliding scale. Take 60 units if sugar is <180, then take 100 units if sugar is over 400     insulin lispro protamine-lispro (HUMALOG 50/50 MIX) (50-50) 100 UNIT/ML SUSP injection Inject 40 Units into  the skin daily.     levothyroxine (SYNTHROID) 175 MCG tablet Take 175 mcg by mouth daily before breakfast.     Potassium Chloride ER 20 MEQ TBCR Take 40 mEq by mouth 2 (two) times daily. 120 tablet 6   simvastatin (ZOCOR) 20 MG tablet Take 20 mg by mouth every evening.     spironolactone (ALDACTONE) 25 MG tablet Take 1 tablet (25 mg total) by mouth daily. 30 tablet 6   telmisartan (MICARDIS) 80 MG tablet Take 1 tablet (80 mg total) by mouth in the morning and at bedtime. 90 tablet 3   torsemide (DEMADEX) 20 MG tablet Take 3 tablets (60 mg total) by mouth every morning AND 2 tablets (40 mg total) every evening. (Patient taking differently: Take 3 tablets (60 mg total) by mouth every morning AND 3 tablets (60 mg total) every evening.)  161 tablet 6   TRULICITY 3 WR/6.0AV SOPN Inject 3.5 mg into the skin once a week.     No current facility-administered medications for this visit.    Allergies  Allergen Reactions   Ace Inhibitors     REACTION: cough   Maxidex [Dexamethasone] Other (See Comments)    Sexual dysfunction   Verapamil     REACTION: constipation    Past Medical History:  Diagnosis Date   Acanthosis nigricans    Atopic dermatitis    Diabetes ([place]) 2004   Erectile dysfunction    Fatty liver 2007   Glaucoma 2008   Gynecomastia 2009   Hyperaldosteronism ([place]) 2000   Hypercholesterolemia    Hyperlipidemia    Hypertension    Hypoglycemic reaction    Hypothyroidism 2004   Incontinence    Intermittent vertigo 2012   Left cervical radiculopathy 2012   Lumbar radiculopathy    Obesity    Peripheral neuropathy    Pollen allergies 2007   perennial   Prostate cancer ([place]) 2006   Reflux esophagitis 1995   Sickle cell trait ([place]) 2006   Sleep apnea, obstructive 2000   uses a cpap   Venous insufficiency 2005   Vitamin D deficiency 2012    Blood pressure 118/60, pulse 69, resp. rate 14, height 6' 2"  (1.88 m), weight (!) 312 lb 9.6 oz (141.8 kg), SpO2 98 %.  Hypertension Patient with essential hypertension, good in office today, although home readings still showing elevations.  Per patient and chart, he is taking telmisartan 80 mg twice daily.  This is twice the maximum recommended dose and is not going to give any added benefit.  Will have him cut the dose back to once daily.  Would not expect for him to have an increase in BP by cutting this back, but asked that he call with home readings in 2 weeks, just to be sure.  He will continue with other medications and we will see him back in the office in 4 weeks for follow up.     Tommy Medal PharmD CPP [facility] [address] [place], [place] [address] [phone]

## 2020-07-28 ENCOUNTER — Ambulatory Visit (INDEPENDENT_AMBULATORY_CARE_PROVIDER_SITE_OTHER): Payer: PRIVATE HEALTH INSURANCE | Admitting: Pharmacist Clinician (PhC)/ Clinical Pharmacy Specialist

## 2020-07-28 DIAGNOSIS — I10 Essential (primary) hypertension: Secondary | ICD-10-CM

## 2020-07-28 MED ORDER — TELMISARTAN 80 MG PO TABS: 80.0000 mg | ORAL_TABLET | Freq: Two times a day (BID) | ORAL | 3 refills | Status: DC

## 2020-07-28 MED ORDER — POTASSIUM CHLORIDE ER 20 MEQ PO TBCR: 40.0000 meq | EXTENDED_RELEASE_TABLET | Freq: Two times a day (BID) | ORAL | 6 refills | Status: DC

## 2020-07-28 NOTE — Patient Instructions (Addendum)
Return for a a follow up appointment Thursday Oct 7 at 11 am  Your blood pressure today is 118/60  Check your blood pressure at home once or twice daily and keep record of the readings.  Please call in about 2 weeks (around 20-24th of Sept) to let us know what your home BP readings are.   Take your BP meds as follows:  Decrease telmisartan 80 mg to just once daily (in the mornings)  Continue with all other medications  Bring all of your meds, your BP cuff and your record of home blood pressures to your next appointment.  Exercise as you're able, try to walk approximately 30 minutes per day.  Keep salt intake to a minimum, especially watch canned and prepared boxed foods.  Eat more fresh fruits and vegetables and fewer canned items.  Avoid eating in fast food restaurants.    HOW TO TAKE YOUR BLOOD PRESSURE: . Rest 5 minutes before taking your blood pressure. .  Don't smoke or drink caffeinated beverages for at least 30 minutes before. . Take your blood pressure before (not after) you eat. . Sit comfortably with your back supported and both feet on the floor (don't cross your legs). . Elevate your arm to heart level on a table or a desk. . Use the proper sized cuff. It should fit smoothly and snugly around your bare upper arm. There should be enough room to slip a fingertip under the cuff. The bottom edge of the cuff should be 1 inch above the crease of the elbow. . Ideally, take 3 measurements at one sitting and record the average.

## 2020-07-28 NOTE — Assessment & Plan Note (Addendum)
Patient with essential hypertension, good in office today, although home readings still showing elevations.  Per patient and chart, he is taking telmisartan 80 mg twice daily.  This is twice the maximum recommended dose and is not going to give any added benefit.  Will have him cut the dose back to once daily.  Would not expect for him to have an increase in BP by cutting this back, but asked that he call with home readings in 2 weeks, just to be sure.  He will continue with other medications and we will see him back in the office in 4 weeks for follow up.

## 2020-08-04 ENCOUNTER — Telehealth: Payer: Self-pay | Admitting: Cardiovascular Disease

## 2020-08-04 NOTE — Telephone Encounter (Signed)
Pt updated with renal ultrasounds results along with MD's recommendations. Pt verbalized understanding and voiced he does not have pain after eating.    Skeet Latch, MD  08/02/2020 11:11 AM EDT     There is no blockage in the arteries to the kidneys. There is severe blockage to one of the arteries to the stomach. Fortunately, there are a lot of arteries that supply blood to this area. Unless he is having pain after eating, there is nothing to do about this.

## 2020-08-04 NOTE — Telephone Encounter (Signed)
Patient returning Melinda's call in regards to Renal results

## 2020-08-07 ENCOUNTER — Encounter: Payer: Self-pay | Admitting: Cardiovascular Disease

## 2020-08-25 ENCOUNTER — Telehealth: Payer: Self-pay | Admitting: *Deleted

## 2020-08-25 ENCOUNTER — Ambulatory Visit (INDEPENDENT_AMBULATORY_CARE_PROVIDER_SITE_OTHER): Payer: PRIVATE HEALTH INSURANCE | Admitting: Pharmacist

## 2020-08-25 ENCOUNTER — Other Ambulatory Visit: Payer: Self-pay

## 2020-08-25 VITALS — BP 120/64 | HR 67 | Resp 14 | Ht 75.0 in | Wt 308.8 lb

## 2020-08-25 DIAGNOSIS — I10 Essential (primary) hypertension: Secondary | ICD-10-CM

## 2020-08-25 MED ORDER — CARVEDILOL 25 MG PO TABS: 25.0000 mg | ORAL_TABLET | Freq: Two times a day (BID) | ORAL | 1 refills | Status: DC

## 2020-08-25 MED ORDER — TELMISARTAN 80 MG PO TABS: 80.0000 mg | ORAL_TABLET | Freq: Every day | ORAL | 1 refills | Status: DC

## 2020-08-25 NOTE — Progress Notes (Signed)
HPI:  Brendan [name] is a 64 y.o. male patient of Brendan [name], with a PMH below who presents today for advanced hypertension clinic follow up.  He was referred to the clinic by Brendan [name] and seen by Brendan. Oval [name] in August. During last OV patient was taking hydralazine twice daily instead of three times, and telmisartan 28m twice daily. We decrease his telmisartan to 876mdsily and increased hydralazine to 10017mID. Patient report compliance , and denies dizziness, swelling, increased fatigue, blurry vision, or chest pain. Only problems reported is increased constipation.  Past Medical History: Diastolic HF Grade 2, chronic; EF 55-60%  OSA On CPAP  CKD Stage 3, most recent GFR at 28, scr 2.68  DM2 5/21; A1c 8.4; BS now 297  hypothyroidism 8/21: TSH 0.42 on levothyroxine 175 mcg    Blood Pressure Goal:  130/80  Current Medications:  carvedilol 25 mg bid (8am - 8pm) spironolactone 25 mg qd - am,  telmisartan 80 mg daily Hydralazine 100 mg TID (12-2pm) Torsemide 62m30mamily Hx: mother died at 83 -69eart failure;  Father died at 40 f26m MI (per chart,pt stated he did not know cause of father death); 1 sister with hypertension, stroke; 3 kids, no hypertension;   Social Hx: no tobacco, no alcohol, no caffeine  Diet: mostly home cooked meals, no added salt; mostly chicken - boil, grilled, no fried;  Mix of veggies (fresh, frozen and canned)  Exercise: activities of daily living  Home BP readings: wife checks readings manually; has been comparing with a wrist cuff.  AM readings average 144/73 by wife, 142/74 by wrist.  Evening readings 137/75 by wife, 147/76 by wrist  Intolerances: ACE - cough  Labs: 8/21: Na 138, K 3.6, Glu 133, BUN 27, SCr 2.68, GFR 28  Wt Readings from Last 3 Encounters:  08/25/20 (!) 308 lb 12.8 oz (140.1 kg)  07/28/20 (!) 312 lb 9.6 oz (141.8 kg)  06/27/20 (!) 318 lb (144.2 kg)   BP Readings from Last 3 Encounters:  08/25/20 120/64  07/28/20 118/60    06/27/20 (!) 162/90   Pulse Readings from Last 3 Encounters:  08/25/20 67  07/28/20 69  06/27/20 65    Current Outpatient Medications  Medication Sig Dispense Refill  . acetaminophen (TYLENOL) 500 MG tablet Take 500 mg by mouth every 6 (six) hours as needed for mild pain.    . Blood Glucose Monitoring Suppl (ONE TOUCH ULTRA SYSTEM KIT) W/DEVICE KIT 1 kit by Does not apply route once. One touch ultrasoft lancets    . brimonidine-timolol (COMBIGAN) 0.2-0.5 % ophthalmic solution Place 1 drop into both eyes every 12 (twelve) hours.    . carvedilol (COREG) 25 MG tablet Take 1 tablet (25 mg total) by mouth 2 (two) times daily. 180 tablet 1  . hydrALAZINE (APRESOLINE) 100 MG tablet Take 1 tablet (100 mg total) by mouth 3 (three) times daily. 90 tablet 3  . Insulin Lispro Prot & Lispro (HUMALOG MIX 75/25 [place]) Inject 60-100 Units into the skin daily. humalog mix 75/25 injection 60-100 units q am sliding scale. Take 60 units if sugar is <180, then take 100 units if sugar is over 400    . insulin lispro protamine-lispro (HUMALOG 50/50 MIX) (50-50) 100 UNIT/ML SUSP injection Inject 40 Units into the skin daily.    . leMarland Kitchenothyroxine (SYNTHROID) 175 MCG tablet Take 175 mcg by mouth daily before breakfast.    . Potassium Chloride ER 20 MEQ TBCR Take 40 mEq by mouth  2 (two) times daily. 120 tablet 6  . simvastatin (ZOCOR) 20 MG tablet Take 20 mg by mouth every evening.    Marland Kitchen spironolactone (ALDACTONE) 25 MG tablet Take 1 tablet (25 mg total) by mouth daily. 30 tablet 6  . telmisartan (MICARDIS) 80 MG tablet Take 1 tablet (80 mg total) by mouth daily. 90 tablet 1  . torsemide (DEMADEX) 20 MG tablet Take 3 tablets (60 mg total) by mouth every morning AND 2 tablets (40 mg total) every evening. (Patient taking differently: Take 3 tablets (60 mg total) by mouth every morning AND 3 tablets (60 mg total) every evening.) 180 tablet 6  . TRULICITY 3 DI/2.6EB SOPN Inject 3.5 mg into the skin once a week.     No current  facility-administered medications for this visit.    Allergies  Allergen Reactions  . Ace Inhibitors     REACTION: cough  . Maxidex [Dexamethasone] Other (See Comments)    Sexual dysfunction  . Verapamil     REACTION: constipation    Past Medical History:  Diagnosis Date  . Acanthosis nigricans   . Atopic dermatitis   . Diabetes ([place]) 2004  . Erectile dysfunction   . Fatty liver 2007  . Glaucoma 2008  . Gynecomastia 2009  . Hyperaldosteronism ([place]) 2000  . Hypercholesterolemia   . Hyperlipidemia   . Hypertension   . Hypoglycemic reaction   . Hypothyroidism 2004  . Incontinence   . Intermittent vertigo 2012  . Left cervical radiculopathy 2012  . Lumbar radiculopathy   . Obesity   . Peripheral neuropathy   . Pollen allergies 2007   perennial  . Prostate cancer ([place]) 2006  . Reflux esophagitis 1995  . Sickle cell trait ([place]) 2006  . Sleep apnea, obstructive 2000   uses a cpap  . Venous insufficiency 2005  . Vitamin D deficiency 2012    Blood pressure 120/64, pulse 67, resp. rate 14, height _0  (1.905 m), weight (!) 308 lb 12.8 oz (140.1 kg), SpO2 97 %.  Hypertension Blood pressure remains at goal after decreasing telmisartan to 66m daily , and increasing Hydralazine to 1073mTID. Patient denies problems with current therapy and has not issues following the therapy.   He is concern about increased constipation and water restriction. I gave recommendation of docusate 10081maily for 1 week, and further discuss with PCP or Brendan BenHaroldine [name] unable to resolve with this recommendation. His physical activity is limited d/t chronic knee pain and recommended abdominal massage for constipation relief for no more that 3 minutes at a time.  Will continue all medications as prescribed, repeat BMET today, and schedule follow up with Brendan RanOval [name] 4-5 weeks.   Brendan [name] PharmD, BCPS, CPPPalmer Lake0[id number]/05/2020 3:35 PM

## 2020-08-25 NOTE — Assessment & Plan Note (Addendum)
Blood pressure remains at goal after decreasing telmisartan to 80mg  daily , and increasing Hydralazine to 100mg  TID. Patient denies problems with current therapy and has not issues following the therapy.   He is concern about increased constipation and water restriction. I gave recommendation of docusate 100mg  daily for 1 week, and further discuss with PCP or Dr Haroldine Laws if unable to resolve with this recommendation. His physical activity is limited d/t chronic knee pain and recommended abdominal massage for constipation relief for no more that 3 minutes at a time.  Will continue all medications as prescribed, repeat BMET today, and schedule follow up with DR Oval Linsey in 4-5 weeks.

## 2020-08-25 NOTE — Telephone Encounter (Signed)
Patient needs 1 month f/u in [facility] Left message to call back

## 2020-08-25 NOTE — Patient Instructions (Addendum)
Return for a follow up appointment in 4-5 weeks with Dr Oval Linsey  Go to the lab in [place] your blood pressure at home daily (if able) and keep record of the readings.  Take your BP meds as follows: *NO MEDICATION CHANGE*  *MAY USE docusate 100mg  daily for 1 week*  Bring all of your meds, your BP cuff and your record of home blood pressures to your next appointment.  Exercise as you're able, try to walk approximately 30 minutes per day.  Keep salt intake to a minimum, especially watch canned and prepared boxed foods.  Eat more fresh fruits and vegetables and fewer canned items.  Avoid eating in fast food restaurants.    HOW TO TAKE YOUR BLOOD PRESSURE: . Rest 5 minutes before taking your blood pressure. .  Don't smoke or drink caffeinated beverages for at least 30 minutes before. . Take your blood pressure before (not after) you eat. . Sit comfortably with your back supported and both feet on the floor (don't cross your legs). . Elevate your arm to heart level on a table or a desk. . Use the proper sized cuff. It should fit smoothly and snugly around your bare upper arm. There should be enough room to slip a fingertip under the cuff. The bottom edge of the cuff should be 1 inch above the crease of the elbow. . Ideally, take 3 measurements at one sitting and record the average.

## 2020-08-26 LAB — BASIC METABOLIC PANEL
BUN/Creatinine Ratio: 15 (ref 10–24)
BUN: 39 mg/dL — ABNORMAL HIGH (ref 8–27)
CO2: 24 mmol/L (ref 20–29)
Calcium: 8.9 mg/dL (ref 8.6–10.2)
Chloride: 100 mmol/L (ref 96–106)
Creatinine, Ser: 2.57 mg/dL — ABNORMAL HIGH (ref 0.76–1.27)
GFR calc Af Amer: 29 mL/min/{1.73_m2} — ABNORMAL LOW (ref >59)
GFR calc non Af Amer: 25 mL/min/{1.73_m2} — ABNORMAL LOW (ref >59)
Glucose: 246 mg/dL — ABNORMAL HIGH (ref 65–99)
Potassium: 4.4 mmol/L (ref 3.5–5.2)
Sodium: 138 mmol/L (ref 134–144)

## 2020-08-26 NOTE — Telephone Encounter (Signed)
Schedule with Dr. Oval Linsey on 10/18 for his follow up.

## 2020-08-26 NOTE — Telephone Encounter (Signed)
Patient was seen yesterday and did not need follow up that soon. scheduled follow up in November, has f/y [facility] end of month

## 2020-09-05 ENCOUNTER — Ambulatory Visit: Payer: PRIVATE HEALTH INSURANCE | Admitting: Cardiovascular Disease

## 2020-09-14 NOTE — Progress Notes (Addendum)
ADVANCED HF CLINIC NOTE  PCP: Dr. Delfina Redwood  HPI: Brendan [name] is 64 year old male with history of diastolic heart failure, HTN, OSA on CPAP, DM2, hypothyroidism, CKD 3b (creatinine ~2.4) and obesity.   Admitted to [facility] 06/21/87 with A/C diastolic heart failure. Diuresed with IV lasix and transitioned to torsemide 40 mg twice a day. Discharge weight 301 pounds.   Today he returns for HF follow up. Says he is doing ok. Mainly limited by knee and leg pain. + stable DOE. No edema, orthopnea or PND.    ECHO 03/2020 EF 55-60% Grade II DD. Moderate LVH , RV normal     Past Medical History:  Diagnosis Date  . Acanthosis nigricans   . Atopic dermatitis   . Diabetes ([place]) 2004  . Erectile dysfunction   . Fatty liver 2007  . Glaucoma 2008  . Gynecomastia 2009  . Hyperaldosteronism ([place]) 2000  . Hypercholesterolemia   . Hyperlipidemia   . Hypertension   . Hypoglycemic reaction   . Hypothyroidism 2004  . Incontinence   . Intermittent vertigo 2012  . Left cervical radiculopathy 2012  . Lumbar radiculopathy   . Obesity   . Peripheral neuropathy   . Pollen allergies 2007   perennial  . Prostate cancer ([place]) 2006  . Reflux esophagitis 1995  . Sickle cell trait ([place]) 2006  . Sleep apnea, obstructive 2000   uses a cpap  . Venous insufficiency 2005  . Vitamin D deficiency 2012    Current Outpatient Medications  Medication Sig Dispense Refill  . acetaminophen (TYLENOL) 500 MG tablet Take 500 mg by mouth every 6 (six) hours as needed for mild pain.    . Blood Glucose Monitoring Suppl (ONE TOUCH ULTRA SYSTEM KIT) W/DEVICE KIT 1 kit by Does not apply route once. One touch ultrasoft lancets    . brimonidine-timolol (COMBIGAN) 0.2-0.5 % ophthalmic solution Place 1 drop into both eyes every 12 (twelve) hours.    . carvedilol (COREG) 25 MG tablet Take 1 tablet (25 mg total) by mouth 2 (two) times daily. 180 tablet 1  . hydrALAZINE (APRESOLINE) 100 MG tablet Take 1 tablet (100 mg total) by mouth  3 (three) times daily. 90 tablet 3  . Insulin Lispro Prot & Lispro (HUMALOG MIX 75/25 [place]) Inject 60-100 Units into the skin daily. humalog mix 75/25 injection 60-100 units q am sliding scale. Take 60 units if sugar is <180, then take 100 units if sugar is over 400    . insulin lispro protamine-lispro (HUMALOG 50/50 MIX) (50-50) 100 UNIT/ML SUSP injection Inject 40 Units into the skin daily.    Marland Kitchen levothyroxine (SYNTHROID) 175 MCG tablet Take 175 mcg by mouth daily before breakfast.    . Potassium Chloride ER 20 MEQ TBCR Take 40 mEq by mouth 2 (two) times daily. 120 tablet 6  . simvastatin (ZOCOR) 20 MG tablet Take 20 mg by mouth every evening.    Marland Kitchen spironolactone (ALDACTONE) 25 MG tablet Take 1 tablet (25 mg total) by mouth daily. 30 tablet 6  . telmisartan (MICARDIS) 80 MG tablet Take 1 tablet (80 mg total) by mouth daily. 90 tablet 1  . torsemide (DEMADEX) 20 MG tablet Take 3 tablets (60 mg total) by mouth every morning AND 2 tablets (40 mg total) every evening. (Patient taking differently: Take 3 tablets (60 mg total) by mouth every morning AND 3 tablets (60 mg total) every evening.) 180 tablet 6  . TRULICITY 3 FD/7.4UZ SOPN Inject 3.5 mg into the  skin once a week.     No current facility-administered medications for this encounter.    Allergies  Allergen Reactions  . Ace Inhibitors     REACTION: cough  . Maxidex [Dexamethasone] Other (See Comments)    Sexual dysfunction  . Verapamil     REACTION: constipation      Social History   Socioeconomic History  . Marital status: Married    Spouse name: Not on file  . Number of children: 3  . Years of education: Not on file  . Highest education level: Not on file  Occupational History  . Occupation: truck Geophysicist/field seismologist    Comment: Retired Economist  Tobacco Use  . Smoking status: Never Smoker  . Smokeless tobacco: Never Used  Substance and Sexual Activity  . Alcohol use: Yes    Comment: one drink every few months  . Drug use: No    . Sexual activity: Not on file  Other Topics Concern  . Not on file  Social History Narrative  . Not on file   Social Determinants of Health   Financial Resource Strain:   . Difficulty of Paying Living Expenses: Not on file  Food Insecurity:   . Worried About Charity fundraiser in the Last Year: Not on file  . Ran Out of Food in the Last Year: Not on file  Transportation Needs:   . Lack of Transportation (Medical): Not on file  . Lack of Transportation (Non-Medical): Not on file  Physical Activity:   . Days of Exercise per Week: Not on file  . Minutes of Exercise per Session: Not on file  Stress:   . Feeling of Stress : Not on file  Social Connections:   . Frequency of Communication with Friends and Family: Not on file  . Frequency of Social Gatherings with Friends and Family: Not on file  . Attends Religious Services: Not on file  . Active Member of Clubs or Organizations: Not on file  . Attends Archivist Meetings: Not on file  . Marital Status: Not on file  Intimate Partner Violence:   . Fear of Current or Ex-Partner: Not on file  . Emotionally Abused: Not on file  . Physically Abused: Not on file  . Sexually Abused: Not on file      Family History  Problem Relation Age of Onset  . Hypertension Father        deceased age 71  . Heart attack Father   . Heart failure Mother   . COPD Sister   . CVA Sister   . Diabetes Daughter   . Colon cancer Neg Hx   . Stomach cancer Neg Hx     Vitals:   09/16/20 0852  BP: 130/80  Pulse: 68  SpO2: 100%  Weight: (!) 141.2 kg (311 lb 3.2 oz)   Wt Readings from Last 3 Encounters:  09/16/20 (!) 141.2 kg (311 lb 3.2 oz)  08/25/20 (!) 140.1 kg (308 lb 12.8 oz)  07/28/20 (!) 141.8 kg (312 lb 9.6 oz)    PHYSICAL EXAM: General:  Obese male  No resp difficulty HEENT: normal Neck: supple. no JVD. Carotids 2+ bilat; no bruits. No lymphadenopathy or thryomegaly appreciated. Cor: PMI nondisplaced. Regular rate &  rhythm. No rubs, gallops or murmurs. Lungs: clear Abdomen: obese soft, nontender, nondistended. No hepatosplenomegaly. No bruits or masses. Good bowel sounds. Extremities: no cyanosis, clubbing, rash, edema Neuro: alert & orientedx3, cranial nerves grossly intact. moves all 4 extremities w/o difficulty.  Affect pleasant   ASSESSMENT & PLAN: 1. Chronic Diastolic Heart Failure, - ECHO 03/2020 EF preserved EF LVH Grade II DD - Chronic NYHA III. Suspect some of limitation due to weight and orthopedic issues - Continue torsemide 60 bid  - labs today  2.HTN  - BP improved. He has been off spiro for 1 week will not restart with CKD IV - Unable to start entresto due to insurance. Continue telmisartan. -> may need to stop ARB soon with CKD IV - Continue hydralazine 59m three times a day - Follows with Dr. ROval Linseyin HTN program  3. DMII - He stopped Invokana because it dropped his sugar too much. I urged him to reconsider - Followed by Endocrinology   4. OSA - Continue CPAP every night. .   5. Obesity  Body mass index is 38.9 kg/m.  - [facility]   6. CKD Stage IV - Creatinine baseline 2.4 - Recent labs creatinine 2.7 - [facility] - Consider restarting SGLT2i   DGlori BickersMD 9:31 AM

## 2020-09-16 ENCOUNTER — Ambulatory Visit (HOSPITAL_COMMUNITY)
Admission: RE | Admit: 2020-09-16 | Discharge: 2020-09-16 | Disposition: A | Discharge status: Home / Self Care | Payer: PRIVATE HEALTH INSURANCE | Source: Ambulatory Visit | Attending: Internal Medicine | Admitting: Internal Medicine

## 2020-09-16 ENCOUNTER — Encounter (HOSPITAL_COMMUNITY): Payer: Self-pay | Admitting: Internal Medicine

## 2020-09-16 ENCOUNTER — Other Ambulatory Visit: Payer: Self-pay

## 2020-09-16 VITALS — BP 130/80 | HR 68 | Wt 311.2 lb

## 2020-09-16 DIAGNOSIS — I130 Hypertensive heart and chronic kidney disease with heart failure and stage 1 through stage 4 chronic kidney disease, or unspecified chronic kidney disease: Secondary | ICD-10-CM | POA: Insufficient documentation

## 2020-09-16 DIAGNOSIS — Z794 Long term (current) use of insulin: Secondary | ICD-10-CM | POA: Diagnosis not present

## 2020-09-16 DIAGNOSIS — Z7901 Long term (current) use of anticoagulants: Secondary | ICD-10-CM | POA: Diagnosis not present

## 2020-09-16 DIAGNOSIS — Z8249 Family history of ischemic heart disease and other diseases of the circulatory system: Secondary | ICD-10-CM | POA: Insufficient documentation

## 2020-09-16 DIAGNOSIS — Z79899 Other long term (current) drug therapy: Secondary | ICD-10-CM | POA: Diagnosis not present

## 2020-09-16 DIAGNOSIS — M79606 Pain in leg, unspecified: Secondary | ICD-10-CM | POA: Diagnosis not present

## 2020-09-16 DIAGNOSIS — E039 Hypothyroidism, unspecified: Secondary | ICD-10-CM | POA: Diagnosis not present

## 2020-09-16 DIAGNOSIS — N1832 Chronic kidney disease, stage 3b: Secondary | ICD-10-CM | POA: Diagnosis not present

## 2020-09-16 DIAGNOSIS — Z9989 Dependence on other enabling machines and devices: Secondary | ICD-10-CM

## 2020-09-16 DIAGNOSIS — E1122 Type 2 diabetes mellitus with diabetic chronic kidney disease: Secondary | ICD-10-CM | POA: Insufficient documentation

## 2020-09-16 DIAGNOSIS — E1142 Type 2 diabetes mellitus with diabetic polyneuropathy: Secondary | ICD-10-CM | POA: Diagnosis not present

## 2020-09-16 DIAGNOSIS — N184 Chronic kidney disease, stage 4 (severe): Secondary | ICD-10-CM | POA: Diagnosis not present

## 2020-09-16 DIAGNOSIS — R0609 Other forms of dyspnea: Secondary | ICD-10-CM | POA: Diagnosis present

## 2020-09-16 DIAGNOSIS — G4733 Obstructive sleep apnea (adult) (pediatric): Secondary | ICD-10-CM

## 2020-09-16 DIAGNOSIS — I10 Essential (primary) hypertension: Secondary | ICD-10-CM

## 2020-09-16 DIAGNOSIS — E669 Obesity, unspecified: Secondary | ICD-10-CM | POA: Insufficient documentation

## 2020-09-16 DIAGNOSIS — I5032 Chronic diastolic (congestive) heart failure: Secondary | ICD-10-CM

## 2020-09-16 DIAGNOSIS — Z6838 Body mass index (BMI) 38.0-38.9, adult: Secondary | ICD-10-CM | POA: Diagnosis not present

## 2020-09-16 LAB — BASIC METABOLIC PANEL
Anion gap: 7 (ref 5–15)
BUN: 34 mg/dL — ABNORMAL HIGH (ref 8–23)
CO2: 28 mmol/L (ref 22–32)
Calcium: 8.9 mg/dL (ref 8.9–10.3)
Chloride: 102 mmol/L (ref 98–111)
Creatinine, Ser: 2.26 mg/dL — ABNORMAL HIGH (ref 0.61–1.24)
GFR, Estimated: 31 mL/min — ABNORMAL LOW (ref >60)
Glucose, Bld: 243 mg/dL — ABNORMAL HIGH (ref 70–99)
Potassium: 3.6 mmol/L (ref 3.5–5.1)
Sodium: 137 mmol/L (ref 135–145)

## 2020-09-16 LAB — BRAIN NATRIURETIC PEPTIDE: B Natriuretic Peptide: 244.4 pg/mL — ABNORMAL HIGH (ref 0.0–100.0)

## 2020-09-16 NOTE — Addendum Note (Signed)
Encounter addended by: Stanford Scotland, RN on: 09/16/2020 9:47 AM  Actions taken: Diagnosis association updated, Medication long-term status modified, Order list changed, Clinical Note Signed, Charge Capture section accepted

## 2020-09-16 NOTE — Addendum Note (Signed)
Encounter addended by: Stanford Scotland, RN on: 09/16/2020 5:00 PM  Actions taken: Clinical Note Signed

## 2020-09-16 NOTE — Patient Instructions (Addendum)
Stop Spironolactone  Labs done today we will call you with abnormal results.  You have been referred to [facility] they will call you for appointment.  Please call our office for  appointment in April 2022

## 2020-09-16 NOTE — Addendum Note (Signed)
Encounter addended by: Stanford Scotland, RN on: 09/16/2020 11:14 AM  Actions taken: Clinical Note Signed

## 2020-09-16 NOTE — Progress Notes (Signed)
Referral and records faxed to [facility] °

## 2020-10-10 ENCOUNTER — Ambulatory Visit: Payer: PRIVATE HEALTH INSURANCE | Admitting: Cardiovascular Disease

## 2020-10-10 ENCOUNTER — Encounter: Payer: Self-pay | Admitting: Cardiovascular Disease

## 2020-10-10 ENCOUNTER — Other Ambulatory Visit: Payer: Self-pay

## 2020-10-10 VITALS — BP 150/72 | HR 71 | Ht 75.0 in | Wt 318.0 lb

## 2020-10-10 DIAGNOSIS — I5033 Acute on chronic diastolic (congestive) heart failure: Secondary | ICD-10-CM

## 2020-10-10 DIAGNOSIS — G4733 Obstructive sleep apnea (adult) (pediatric): Secondary | ICD-10-CM

## 2020-10-10 DIAGNOSIS — I10 Essential (primary) hypertension: Secondary | ICD-10-CM

## 2020-10-10 DIAGNOSIS — E876 Hypokalemia: Secondary | ICD-10-CM | POA: Diagnosis not present

## 2020-10-10 NOTE — Patient Instructions (Addendum)
Medication Instructions:  TAKE TORSEMIDE 80 MG ONCE IN THE MORNING AND ONCE AT 2:00 PM FOR 2 DAYS ONLY. WHEN YOU DECREASE BACK TO REGULAR DOSE TAKE YOUR SECOND DOSE AT 2:00 PM   INCREASE YOUR HYDRALAZINE BY ADDING A DOSE AT 2:00 PM FOR A TOTAL OF 3 TIMES A DAY   Labwork: HAVE DR Joelyn Oms SEND DR [place] [facility] NEXT TIME YOU SEE HIM   Testing/Procedures: NONE  Follow-Up: 11/08/2020 AT 2:00 PM

## 2020-10-10 NOTE — Progress Notes (Signed)
Hypertension Clinic Follow Up Assessment:    Date:  10/10/2020   ID:  Brendan [name], DOB [birth year], MRN [id number]  PCP:  Seward Carol, MD  Cardiologist:  No primary care provider on file.  Nephrologist: Dr. Joelyn Oms, [place]  Referring MD: Seward Carol, MD   CC: Hypertension  History of Present Illness:    Brendan [name] is a 64 y.o. male with a hx of chronic diastolic heart failure, hypertension, OSA on CPAP, DM, CKD 3, hypothyroidism and obesity here for follow-up.  He initially established care in the hypertension clinic 06/2020. He was first diagnoed with hypertension in his 46s.  In the past he reports it was easier to control but at the time of his initial consultation it had been as high as the 200s over 100s.  He was also struggling with a lot of pain.  Ge was admitted to [facility] 03/2020 with acute on chronic diastolic heart failure.  He was diuresed with IV Lasix and discharged on torsemide.  He saw Dr. Haroldine Laws 05/2020 and hydralazine was increased.  They previously tried to get him on Entresto but were unable due to insurance.  At his initial events hypertension clinic visit hydralazine was increased to 100 mg three times daily.  He followed up with our pharmacist who noted that he was actually only taking it twice daily.  He has also been taking the telmisartan twice a day even though it was scheduled for once a day.They had him cut back to once a day on the telmisartan.  Hydralazine was also transitioned to three times a day as previously recommended.  He saw Dr. Haroldine Laws on 08/2020 and his blood pressure was well have been controlled.  He had been off of spironolactone and was not restarted due to his chronic kidney disease.  He doesn't check his BP at home.  At home he increased from 298 to 309 lb. He notes that his weight is increasing, though he doesn't weigh himself daily.  His breathing is stable and he has no orthopnea or PND.  He isn't exercising due to pain.  He  tries to walk a little daily.  He has ankle edema but no orthopnea or PND.  He also struggles with constipation.  Previous antihypertensives: Cozaar   Past Medical History:  Diagnosis Date  . Acanthosis nigricans   . Atopic dermatitis   . Diabetes ([place]) 2004  . Erectile dysfunction   . Fatty liver 2007  . Glaucoma 2008  . Gynecomastia 2009  . Hyperaldosteronism ([place]) 2000  . Hypercholesterolemia   . Hyperlipidemia   . Hypertension   . Hypoglycemic reaction   . Hypothyroidism 2004  . Incontinence   . Intermittent vertigo 2012  . Left cervical radiculopathy 2012  . Lumbar radiculopathy   . Obesity   . Peripheral neuropathy   . Pollen allergies 2007   perennial  . Prostate cancer ([place]) 2006  . Reflux esophagitis 1995  . Sickle cell trait ([place]) 2006  . Sleep apnea, obstructive 2000   uses a cpap  . Venous insufficiency 2005  . Vitamin D deficiency 2012    Past Surgical History:  Procedure Laterality Date  . COLONOSCOPY  2006   normal  . lap band surgery  2009  . left inguinal hernia repair  1998  . robotic prostatectomy  2008    Current Medications: Current Meds  Medication Sig  . acetaminophen (TYLENOL) 500 MG tablet Take 500 mg by mouth every 6 (six) hours as needed  for mild pain.  . Blood Glucose Monitoring Suppl (ONE TOUCH ULTRA SYSTEM KIT) W/DEVICE KIT 1 kit by Does not apply route once. One touch ultrasoft lancets  . brimonidine-timolol (COMBIGAN) 0.2-0.5 % ophthalmic solution Place 1 drop into both eyes every 12 (twelve) hours.  . canagliflozin (INVOKANA) 300 MG TABS tablet Take 300 mg by mouth daily before breakfast.  . carvedilol (COREG) 25 MG tablet Take 1 tablet (25 mg total) by mouth 2 (two) times daily.  . hydrALAZINE (APRESOLINE) 100 MG tablet Take 1 tablet (100 mg total) by mouth 3 (three) times daily.  . Insulin Lispro Prot & Lispro (HUMALOG MIX 75/25 [place]) Inject 60-100 Units into the skin daily. humalog mix 75/25 injection 60-100 units q am sliding  scale. Take 60 units if sugar is <180, then take 100 units if sugar is over 400  . insulin lispro protamine-lispro (HUMALOG 50/50 MIX) (50-50) 100 UNIT/ML SUSP injection Inject 40 Units into the skin daily.  Marland Kitchen levothyroxine (SYNTHROID) 175 MCG tablet Take 175 mcg by mouth daily before breakfast.  . Potassium Chloride ER 20 MEQ TBCR Take 40 mEq by mouth 2 (two) times daily.  . simvastatin (ZOCOR) 20 MG tablet Take 20 mg by mouth every evening.  . torsemide (DEMADEX) 20 MG tablet Take 3 tablets (60 mg total) by mouth every morning AND 2 tablets (40 mg total) every evening. (Patient taking differently: Take 3 tablets (60 mg total) by mouth every morning AND 3 tablets (60 mg total) every evening.)  . TRULICITY 3 OZ/3.6UY SOPN Inject 3.5 mg into the skin once a week.  . [DISCONTINUED] telmisartan (MICARDIS) 80 MG tablet Take 1 tablet (80 mg total) by mouth daily.     Allergies:   Ace inhibitors, Maxidex [dexamethasone], and Verapamil   Social History   Socioeconomic History  . Marital status: Married    Spouse name: Not on file  . Number of children: 3  . Years of education: Not on file  . Highest education level: Not on file  Occupational History  . Occupation: truck Geophysicist/field seismologist    Comment: Retired Economist  Tobacco Use  . Smoking status: Never Smoker  . Smokeless tobacco: Never Used  Substance and Sexual Activity  . Alcohol use: Yes    Comment: one drink every few months  . Drug use: No  . Sexual activity: Not on file  Other Topics Concern  . Not on file  Social History Narrative  . Not on file   Social Determinants of Health   Financial Resource Strain:   . Difficulty of Paying Living Expenses: Not on file  Food Insecurity:   . Worried About Charity fundraiser in the Last Year: Not on file  . Ran Out of Food in the Last Year: Not on file  Transportation Needs:   . Lack of Transportation (Medical): Not on file  . Lack of Transportation (Non-Medical): Not on file    Physical Activity:   . Days of Exercise per Week: Not on file  . Minutes of Exercise per Session: Not on file  Stress:   . Feeling of Stress : Not on file  Social Connections:   . Frequency of Communication with Friends and Family: Not on file  . Frequency of Social Gatherings with Friends and Family: Not on file  . Attends Religious Services: Not on file  . Active Member of Clubs or Organizations: Not on file  . Attends Archivist Meetings: Not on file  . Marital Status:  Not on file     Family History: The patient's family history includes COPD in his sister; CVA in his sister; Diabetes in his daughter; Heart attack in his father; Heart failure in his mother; Hypertension in his father. There is no history of Colon cancer or Stomach cancer.  ROS:   Please see the history of present illness.     All other systems reviewed and are negative.  EKGs/Labs/Other Studies Reviewed:    EKG:  EKG is not ordered today.    Echo 03/26/20: IMPRESSIONS    1. Left ventricular ejection fraction, by estimation, is 55 to 60%. The  left ventricle has normal function. Left ventricular endocardial border  not optimally defined to evaluate regional wall motion. There is moderate  left ventricular hypertrophy. Left  ventricular diastolic parameters are consistent with Grade II diastolic  dysfunction (pseudonormalization). Elevated left ventricular end-diastolic  pressure.  2. Right ventricular systolic function is normal. The right ventricular  size is normal. Tricuspid regurgitation signal is inadequate for assessing  PA pressure.  3. The mitral valve is abnormal. Trivial mitral valve regurgitation.  4. The aortic valve was not well visualized. Aortic valve regurgitation  is not visualized.  5. The inferior vena cava is dilated in size with <50% respiratory  variability, suggesting right atrial pressure of 15 mmHg.   Recent Labs: 03/26/2020: Magnesium 1.8 03/27/2020: Hemoglobin  10.6; Platelets 356 09/16/2020: B Natriuretic Peptide 244.4; BUN 34; Creatinine, Ser 2.26; Potassium 3.6; Sodium 137   Recent Lipid Panel No results found for: CHOL, TRIG, HDL, CHOLHDL, VLDL, LDLCALC, LDLDIRECT  Physical Exam:    VS:  BP (!) 150/72   Pulse 71   Ht 6' 3"  (1.905 m)   Wt (!) 318 lb (144.2 kg)   SpO2 98%   BMI 39.75 kg/m  , BMI Body mass index is 39.75 kg/m. GENERAL:  Well appearing HEENT: Pupils equal round and reactive, fundi not visualized, oral mucosa unremarkable NECK:  No jugular venous distention, waveform within normal limits, carotid upstroke brisk and symmetric, no bruits, no thyromegaly LYMPHATICS:  No cervical adenopathy LUNGS:  Clear to auscultation bilaterally HEART:  RRR.  PMI not displaced or sustained,S1 and S2 within normal limits, no S3, no S4, no clicks, no rubs, no murmurs ABD:  Flat, positive bowel sounds normal in frequency in pitch, no bruits, no rebound, no guarding, no midline pulsatile mass, no hepatomegaly, no splenomegaly EXT:  2 plus pulses throughout, 1+ LE edema to upper tibia bilaterally, no cyanosis no clubbing SKIN:  No rashes no nodules NEURO:  Cranial nerves II through XII grossly intact, motor grossly intact throughout PSYCH:  Cognitively intact, oriented to person place and time    ASSESSMENT:    1. Acute on chronic diastolic CHF (congestive heart failure) ([place])   2. Primary hypertension   3. Obstructive sleep apnea   4. Hypokalemia     PLAN:    # Resistant hypertension: Blood pressure remains poorly controlled. Sprionolactone was stopped due to wrosening renal function which likely contributes to his volume overload as well.  He was prescribed hydralazine 3 times a day but was only taking it twice a day.  Will increase hydralazine to 100 mg 3 times daily.  For the next 2 days have also asked him to take 80 mg of torsemide twice a day instead of 60 mg.  Continue checking blood pressure twice daily.  Secondary Causes of  Hypertension  Medications/Herbal: OCP, steroids, stimulants, antidepressants, weight loss medication, immune suppressants, NSAIDs, sympathomimetics,  alcohol, caffeine, licorice, ginseng, St. John's wort, chemo  Sleep Apnea: Continue CPAP Renal artery stenosis: No renal artery stenosis 07/2020.  Severe celiac artery stenosis.  Patient is asymptomatic. Hyperaldosteronism: no ademona on CT in 2007.  Consider checking renin Hyper/hypothyroidism: need to check TSH at follow up Pheochromocytoma: (testing not indicated) Cushing's syndrome: (testing not indicated)  Coarctation of the aorta: BP symmetric  # Acute on cronic diastolic heart fialure: Grade 2 diastolic .  Follows with [facility] clinic.  Increase torsemide to 80 mg twice daily for 2 days as above.  Disposition:    FU with MD/PharmD in 1 month    Medication Adjustments/Labs and Tests Ordered: Current medicines are reviewed at length with the patient today.  Concerns regarding medicines are outlined above.  No orders of the defined types were placed in this encounter.  No orders of the defined types were placed in this encounter.    Signed, Skeet Latch, MD  10/10/2020 6:09 PM    [place]

## 2020-10-24 ENCOUNTER — Other Ambulatory Visit: Payer: Self-pay | Admitting: Cardiovascular Disease

## 2020-11-08 ENCOUNTER — Ambulatory Visit (INDEPENDENT_AMBULATORY_CARE_PROVIDER_SITE_OTHER): Payer: PRIVATE HEALTH INSURANCE | Admitting: Pharmacist

## 2020-11-08 ENCOUNTER — Other Ambulatory Visit: Payer: Self-pay

## 2020-11-08 ENCOUNTER — Ambulatory Visit: Payer: PRIVATE HEALTH INSURANCE

## 2020-11-08 VITALS — BP 140/68 | HR 63 | Resp 17 | Ht 74.0 in | Wt 308.4 lb

## 2020-11-08 DIAGNOSIS — I10 Essential (primary) hypertension: Secondary | ICD-10-CM

## 2020-11-08 NOTE — Progress Notes (Signed)
Patient ID: Brendan [name]                 DOB: [birth year]                      MRN: [id number]     HPI: Brendan [name] is a 64 y.o. male referred by Dr. Haroldine Laws to HTN clinic.  During last OV patient was taking hydralazine twice daily instead of three times, and telmisartan 92m twice daily.  Telmisartan was decreased to 862mdaily and increased hydralazine to 10082mID. Patient reports compliance , and denies dizziness, swelling, increased fatigue, blurry vision, or chest pain. Only problems reported is increased constipation.  At last visit with Dr BenHaroldine Lawstient reported he had discontinued Invokana but has since restarted it since seeing nephrologist.   Lab work rechecked at nephrology visit:  Scr 2.29 on 11/3.  2.26 on 10/29 eGFR AA 33,  nonAA 29 on 09/21/20.  31 on 09/16/20  Patient reports his blood pressure readings at home have been normal but could not remember any readings and did not bring cuff.  Wife tests blood pressure at home.    Is frustrated by the number of medications he has to take daily. Was approved for Entresto over the Summer but unclear why he had not started it yet.  Reports he received a letter recently that his InvAnastasio Auerbachuld not be covered in 2022 but does not remember which new medication will be preferred.  Reports he does not have much appetite and his blood sugar readings have been "fair to middling."    Current HTN meds: carvedilol 25 mg bid (8am - 8pm) telmisartan 80 mg daily Hydralazine 100 mg TID  Torsemide 60 mg BID ( 3 tablets twice daily) Invokana 300m107mce a day   BP goal: <130/80  Wt Readings from Last 3 Encounters:  10/10/20 (!) 318 lb (144.2 kg)  09/16/20 (!) 311 lb 3.2 oz (141.2 kg)  08/25/20 (!) 308 lb 12.8 oz (140.1 kg)   BP Readings from Last 3 Encounters:  10/10/20 (!) 150/72  09/16/20 130/80  08/25/20 120/64   Pulse Readings from Last 3 Encounters:  10/10/20 71  09/16/20 68  08/25/20 67    Renal function: CrCl cannot be  calculated (Patient's most recent lab result is older than the maximum 21 days allowed.).  Past Medical History:  Diagnosis Date  . Acanthosis nigricans   . Atopic dermatitis   . Diabetes (HCC)[place]04  . Erectile dysfunction   . Fatty liver 2007  . Glaucoma 2008  . Gynecomastia 2009  . Hyperaldosteronism (HCC)[place]00  . Hypercholesterolemia   . Hyperlipidemia   . Hypertension   . Hypoglycemic reaction   . Hypothyroidism 2004  . Incontinence   . Intermittent vertigo 2012  . Left cervical radiculopathy 2012  . Lumbar radiculopathy   . Obesity   . Peripheral neuropathy   . Pollen allergies 2007   perennial  . Prostate cancer (HCC)[place]06  . Reflux esophagitis 1995  . Sickle cell trait (HCC)[place]06  . Sleep apnea, obstructive 2000   uses a cpap  . Venous insufficiency 2005  . Vitamin D deficiency 2012    Current Outpatient Medications on File Prior to Visit  Medication Sig Dispense Refill  . acetaminophen (TYLENOL) 500 MG tablet Take 500 mg by mouth every 6 (six) hours as needed for mild pain.    . Blood Glucose Monitoring Suppl (ONE TOUCH ULTRA SYSTEM KIT) W/DEVICE KIT 1 kit  by Does not apply route once. One touch ultrasoft lancets    . brimonidine-timolol (COMBIGAN) 0.2-0.5 % ophthalmic solution Place 1 drop into both eyes every 12 (twelve) hours.    . canagliflozin (INVOKANA) 300 MG TABS tablet Take 300 mg by mouth daily before breakfast.    . carvedilol (COREG) 25 MG tablet Take 1 tablet (25 mg total) by mouth 2 (two) times daily. 180 tablet 1  . hydrALAZINE (APRESOLINE) 100 MG tablet TAKE 1 TABLET (100 MG TOTAL) BY MOUTH 3 (THREE) TIMES DAILY. 90 tablet 3  . Insulin Lispro Prot & Lispro (HUMALOG MIX 75/25 [place]) Inject 60-100 Units into the skin daily. humalog mix 75/25 injection 60-100 units q am sliding scale. Take 60 units if sugar is <180, then take 100 units if sugar is over 400    . insulin lispro protamine-lispro (HUMALOG 50/50 MIX) (50-50) 100 UNIT/ML SUSP injection Inject 40  Units into the skin daily.    Marland Kitchen levothyroxine (SYNTHROID) 175 MCG tablet Take 175 mcg by mouth daily before breakfast.    . Potassium Chloride ER 20 MEQ TBCR Take 40 mEq by mouth 2 (two) times daily. 120 tablet 6  . simvastatin (ZOCOR) 20 MG tablet Take 20 mg by mouth every evening.    . torsemide (DEMADEX) 20 MG tablet Take 3 tablets (60 mg total) by mouth every morning AND 2 tablets (40 mg total) every evening. (Patient taking differently: Take 3 tablets (60 mg total) by mouth every morning AND 3 tablets (60 mg total) every evening.) 180 tablet 6  . TRULICITY 3 KN/3.9JQ SOPN Inject 3.5 mg into the skin once a week.     No current facility-administered medications on file prior to visit.    Allergies  Allergen Reactions  . Ace Inhibitors     REACTION: cough  . Maxidex [Dexamethasone] Other (See Comments)    Sexual dysfunction  . Verapamil     REACTION: constipation     Assessment/Plan:  1. Hypertension - Patient BP in room 140/68 which is above goal of <130/80.  Rechecked with electronic monitor (158/88).  Patient believes these are both significantly above what his home readings have been.  Patient not a great historian.  Recommended patient start a new agent such as doxasozin but patient is resistant because of high pill burden already.   Reached out to HF clinic who suggested that since renal function has been steady, Delene Loll is an option with close monitoring and patient now has Pharmacist, community and patient already on telmisartan. Unclear what patient's home BP readings are.  Will contact wife tomorrow for home readings and discuss next steps.    Continue Carvedilol 12m BID Continue hydralazine 1053mTID Continue torsemide 6076mID Continue telmisartan 76m50mily  ChriKarren CobblearmD, BCACP, CDCES, CPP [id number] [id number]: [phone]: [phone]/2021 5:35 PM

## 2020-11-08 NOTE — Patient Instructions (Addendum)
We would like your blood pressure to be less than 130/80  Continue your telmisartan 80 mg once a day Continue your hydralazine 100 mg three times a day Continue your torsemide 20 mg (3 tablets) twice a day Continue your Invokana 300 mg once a day  Continue to eat a heart healthy diet low in salt  I will contact Dr Bensimhon's office about the [facility]  Have your wife call us with your blood pressure readings and let us know which medication your plan prefers over the [place]  Call with any questions  Karren Cobble, PharmD, BCACP, [place], [place]. [address], [place], [place] [address] Phone: [phone]; Fax: [phone] 11/08/2020 2:09 PM

## 2020-12-28 ENCOUNTER — Encounter: Payer: Self-pay | Admitting: Neurology

## 2021-01-16 ENCOUNTER — Other Ambulatory Visit (HOSPITAL_COMMUNITY): Payer: Self-pay | Admitting: Internal Medicine

## 2021-02-13 ENCOUNTER — Other Ambulatory Visit: Payer: Self-pay

## 2021-02-13 ENCOUNTER — Ambulatory Visit: Payer: PRIVATE HEALTH INSURANCE | Admitting: Neurology

## 2021-02-13 ENCOUNTER — Encounter: Payer: Self-pay | Admitting: Neurology

## 2021-02-13 VITALS — BP 145/72 | HR 72 | Ht 74.0 in | Wt 312.0 lb

## 2021-02-13 DIAGNOSIS — E1142 Type 2 diabetes mellitus with diabetic polyneuropathy: Secondary | ICD-10-CM | POA: Diagnosis not present

## 2021-02-13 NOTE — Progress Notes (Signed)
[facility] Clinic Note - Initial Visit   Date: 02/13/21  Brendan [name] MRN: [id number] DOB: [birth year]   Dear Dr. Delfina Redwood:  Thank you for your kind referral of Brendan [name] for consultation of neuropathy. Although his history is well known to you, please allow [place] to reiterate it for the purpose of our medical record. The patient was accompanied to the clinic by self    History of Present Illness: Brendan [name] is a 65 y.o. right-handed male with diabetes mellitus, CKD, hyperlipidemia, hypothyroidism, and prostate cancer presenting for evaluation of neuropathy.  Over the past year, patient started having numbness in the feet which has progressed into his lower legs.  About 6 months ago, he began having numbness in the hands.  He also has difficulty with fine motor tasks, opening jars/bottles. He endorses imbalance.  He had NCS/EMG of the arms performed at [facility] which was consistent with sensorimotor polyneuropathy.  MRI cervical spine performed at [facility] shows congentical spinal canal narrowinwith superimposed spondylosis, C5-6 foraminal stenosis and left paracentral subarticular protrusions with moderate right ans severe left foraminal narrowing.  Neurosurgery did not feel that hand symptoms were related to his cervical spine.  He is here for my opinion.    Out-side paper records, electronic medical record, and images have been reviewed where available and summarized as:  Lab Results  Component Value Date   HGBA1C 8.4 (H) 03/26/2020   Past Medical History:  Diagnosis Date  . Acanthosis nigricans   . Atopic dermatitis   . Diabetes ([place]) 2004  . Erectile dysfunction   . Fatty liver 2007  . Glaucoma 2008  . Gynecomastia 2009  . Hyperaldosteronism ([place]) 2000  . Hypercholesterolemia   . Hyperlipidemia   . Hypertension   . Hypoglycemic reaction   . Hypothyroidism 2004  . Incontinence   . Intermittent vertigo 2012  . Left  cervical radiculopathy 2012  . Lumbar radiculopathy   . Obesity   . Peripheral neuropathy   . Pollen allergies 2007   perennial  . Prostate cancer ([place]) 2006  . Reflux esophagitis 1995  . Sickle cell trait ([place]) 2006  . Sleep apnea, obstructive 2000   uses a cpap  . Venous insufficiency 2005  . Vitamin D deficiency 2012    Past Surgical History:  Procedure Laterality Date  . COLONOSCOPY  2006   normal  . lap band surgery  2009  . left inguinal hernia repair  1998  . robotic prostatectomy  2008     Medications:  Outpatient Encounter Medications as of 02/13/2021  Medication Sig  . atorvastatin (LIPITOR) 40 MG tablet Take 40 mg by mouth daily.  . Blood Glucose Monitoring Suppl (ONE TOUCH ULTRA SYSTEM KIT) W/DEVICE KIT 1 kit by Does not apply route once. One touch ultrasoft lancets  . brimonidine-timolol (COMBIGAN) 0.2-0.5 % ophthalmic solution Place 1 drop into both eyes every 12 (twelve) hours.  . carvedilol (COREG) 25 MG tablet Take 1 tablet (25 mg total) by mouth 2 (two) times daily.  . Cholecalciferol (VITAMIN D3) 50 MCG (2000 UT) CAPS Take by mouth.  Marland Kitchen FARXIGA 10 MG TABS tablet Take 10 mg by mouth daily.  . febuxostat (ULORIC) 40 MG tablet 1 tablet  . hydrALAZINE (APRESOLINE) 100 MG tablet TAKE 1 TABLET (100 MG TOTAL) BY MOUTH 3 (THREE) TIMES DAILY.  Marland Kitchen Insulin Lispro Prot & Lispro (HUMALOG MIX 75/25 [place]) Inject 60-100 Units into the skin daily. humalog mix 75/25 injection 60-100 units q am  sliding scale. Take 60 units if sugar is <180, then take 100 units if sugar is over 400  . insulin lispro protamine-lispro (HUMALOG 50/50 MIX) (50-50) 100 UNIT/ML SUSP injection Inject 40 Units into the skin daily.  Marland Kitchen levothyroxine (SYNTHROID) 175 MCG tablet Take 175 mcg by mouth daily before breakfast.  . Potassium Chloride ER 20 MEQ TBCR Take 40 mEq by mouth 2 (two) times daily.  Marland Kitchen telmisartan (MICARDIS) 80 MG tablet Take 1 tablet by mouth daily.  Marland Kitchen torsemide (DEMADEX) 20 MG tablet Take 3  tablets (60 mg total) by mouth every morning AND 3 tablets (60 mg total) every evening.  . TRULICITY 3 KG/4.0NU SOPN Inject 3.5 mg into the skin once a week.  Marland Kitchen acetaminophen (TYLENOL) 500 MG tablet Take 500 mg by mouth every 6 (six) hours as needed for mild pain. (Patient not taking: Reported on 02/13/2021)  . canagliflozin (INVOKANA) 300 MG TABS tablet Take 300 mg by mouth daily before breakfast. (Patient not taking: Reported on 02/13/2021)  . simvastatin (ZOCOR) 20 MG tablet Take 20 mg by mouth every evening. (Patient not taking: Reported on 02/13/2021)   No facility-administered encounter medications on file as of 02/13/2021.    Allergies:  Allergies  Allergen Reactions  . Ace Inhibitors     REACTION: cough  . Maxidex [Dexamethasone] Other (See Comments)    Sexual dysfunction  . Verapamil     REACTION: constipation    Family History: Family History  Problem Relation Age of Onset  . Hypertension Father        deceased age 87  . Heart attack Father   . Heart failure Mother   . COPD Sister   . CVA Sister   . Diabetes Daughter   . Colon cancer Neg Hx   . Stomach cancer Neg Hx     Social History: Social History   Tobacco Use  . Smoking status: Never Smoker  . Smokeless tobacco: Never Used  Vaping Use  . Vaping Use: Never used  Substance Use Topics  . Alcohol use: Yes    Comment: one drink every few months  . Drug use: No   Social History   Social History Narrative   Right handed   Lives in a two story home    Drinks no caffeine     Vital Signs:  BP (!) 145/72   Pulse 72   Ht 6' 2"  (1.88 m)   Wt (!) 312 lb (141.5 kg)   SpO2 98%   BMI 40.06 kg/m    Neurological Exam: MENTAL STATUS including orientation to time, place, person, recent and remote memory, attention span and concentration, language, and fund of knowledge is normal.  Speech is not dysarthric.  CRANIAL NERVES: II:  No visual field defects.  III-IV-VI: Pupils equal round and reactive to light.   Normal conjugate, extra-ocular eye movements in all directions of gaze.  No nystagmus.  No ptosis.   V:  Normal facial sensation.    VII:  Normal facial symmetry and movements.   VIII:  Normal hearing and vestibular function.   IX-X:  Normal palatal movement.   XI:  Normal shoulder shrug and head rotation.   XII:  Normal tongue strength and range of motion, no deviation or fasciculation.  MOTOR:  No atrophy, fasciculations or abnormal movements.  No pronator drift.   Upper Extremity:  Right  Left  Deltoid  5/5   5/5   Biceps  5/5   5/5   Triceps  5/5  5/5   Infraspinatus 5/5  5/5  Medial pectoralis 5/5  5/5  Wrist extensors  5/5   5/5   Wrist flexors  5/5   5/5   Finger extensors  5/5   5/5   Finger flexors  5/5   5/5   Dorsal interossei  5/5   5/5   Abductor pollicis  5/5   5/5   Tone (Ashworth scale)  0  0   Lower Extremity:  Right  Left  Hip flexors  5/5   5/5   Hip extensors  5/5   5/5   Adductor 5/5  5/5  Abductor 5/5  5/5  Knee flexors  5/5   5/5   Knee extensors  5/5   5/5   Dorsiflexors  5/5   5/5   Plantarflexors  5/5   5/5   Toe extensors  5/5   5/5   Toe flexors  5/5   5/5   Tone (Ashworth scale)  0  0   MSRs:  Right        Left                  brachioradialis 1+  1+  biceps 1+  1+  triceps 1+  1+  patellar 1+  1+  ankle jerk 0  0  Hoffman no  no  plantar response down  down   SENSORY:  Absent vibration below the ankles. Temperature and pin prick reduced over the hands and below the knees bilaterally.  Mild sway with Rhomberg testing.  COORDINATION/GAIT: Normal finger-to- nose-finger.  Intact rapid alternating movements bilaterally. Antalgic gait, wide-based, unassisted, stable  IMPRESSION: Diabetic peripheral neuropathy manifesting with stocking-glove paresthesias of the hands and feet.  Prior testing has confirmed the presence of neuropathy. Management is targeted at preventing progression of neuropathy by optimizing diabetes management.  His HbA1c  is 8.4.  He does not have painful paresthesias, and therefore medication is not indicated.   I will request records EMG from [facility] at the [place] and MRI cervical spine from [facility] to view personally.  Management is supportive.  Patient educated on daily foot inspection, fall prevention, and safety precautions around the home.   Thank you for allowing me to participate in patient's care.  If I can answer any additional questions, I would be pleased to do so.    Sincerely,    Isela Stantz K. Posey Pronto, DO

## 2021-02-13 NOTE — Patient Instructions (Signed)
Take extra caution walking on uneven ground.  A cane maybe helpful  Install handrails in your bathroom  Check your feet daily  Return to clinic as needed

## 2021-02-18 ENCOUNTER — Other Ambulatory Visit: Payer: Self-pay | Admitting: Cardiovascular Disease

## 2021-03-03 ENCOUNTER — Ambulatory Visit: Payer: PRIVATE HEALTH INSURANCE | Admitting: Neurology

## 2021-03-10 ENCOUNTER — Ambulatory Visit: Payer: PRIVATE HEALTH INSURANCE | Admitting: Podiatry

## 2021-03-10 ENCOUNTER — Other Ambulatory Visit: Payer: Self-pay

## 2021-03-10 DIAGNOSIS — M79675 Pain in left toe(s): Secondary | ICD-10-CM

## 2021-03-10 DIAGNOSIS — B351 Tinea unguium: Secondary | ICD-10-CM

## 2021-03-10 DIAGNOSIS — Q666 Other congenital valgus deformities of feet: Secondary | ICD-10-CM

## 2021-03-10 DIAGNOSIS — Z794 Long term (current) use of insulin: Secondary | ICD-10-CM | POA: Diagnosis not present

## 2021-03-10 DIAGNOSIS — M79674 Pain in right toe(s): Secondary | ICD-10-CM

## 2021-03-10 DIAGNOSIS — E119 Type 2 diabetes mellitus without complications: Secondary | ICD-10-CM | POA: Diagnosis not present

## 2021-03-15 ENCOUNTER — Encounter: Payer: Self-pay | Admitting: Podiatry

## 2021-03-15 NOTE — Progress Notes (Signed)
  Subjective:  Patient ID: Brendan [name], male    DOB: 05/21/1956,  MRN: [id number]  Chief Complaint  Patient presents with  . Diabetes    Foot exam   65 y.o. male returns for the above complaint.  Patient presents with thickened elongated dystrophic toenails x10.  Painful to touch.  Patient would like for me to debride them.  He denies any other acute complaints.  He states is not able to do it himself.Is a diabetic with last A1c of 8.4.  He would also like to discuss getting diabetic shoes as well.  Objective:  There were no vitals filed for this visit. Podiatric Exam: Vascular: dorsalis pedis and posterior tibial pulses are palpable bilateral. Capillary return is immediate. Temperature gradient is WNL. Skin turgor WNL  Sensorium: Normal Semmes Weinstein monofilament test. Normal tactile sensation bilaterally. Nail Exam: Pt has thick disfigured discolored nails with subungual debris noted bilateral entire nail hallux through fifth toenails.  Pain on palpation to the nails. Ulcer Exam: There is no evidence of ulcer or pre-ulcerative changes or infection. Orthopedic Exam: Muscle tone and strength are WNL. No limitations in general ROM. No crepitus or effusions noted. HAV  B/L.  Hammer toes 2-5  B/L.  Gait examination shows pes planovalgus with calcaneal valgus to many toe signs unable to recruit the arch with dorsiflexion of the hallux.  Unable to perform single and double heel raises Skin: No Porokeratosis. No infection or ulcers    Assessment & Plan:   1. Pes planovalgus   2. Pain due to onychomycosis of toenails of both feet   3. Insulin-requiring or dependent type II diabetes mellitus ([place])     Patient was evaluated and treated and all questions answered.  Pes planovalgus -I explained the patient the etiology of pes planovalgus in the setting of diabetes and various treatment options were discussed.  Given that he is a diabetic with uncontrolled A1c will benefit from diabetic shoes.   He will be scheduled to see the orthotics department to be casted for diabetic shoes.  Onychomycosis with pain  -Nails palliatively debrided as below. -Educated on self-care  Procedure: Nail Debridement Rationale: pain  Type of Debridement: manual, sharp debridement. Instrumentation: Nail nipper, rotary burr. Number of Nails: 10  Procedures and Treatment: Consent by patient was obtained for treatment procedures. The patient understood the discussion of treatment and procedures well. All questions were answered thoroughly reviewed. Debridement of mycotic and hypertrophic toenails, 1 through 5 bilateral and clearing of subungual debris. No ulceration, no infection noted.  Return Visit-Office Procedure: Patient instructed to return to the office for a follow up visit 3 months for continued evaluation and treatment.  Boneta Lucks, DPM    Return in about 3 months (around 06/09/2021).

## 2021-04-17 ENCOUNTER — Other Ambulatory Visit: Payer: Self-pay | Admitting: Cardiovascular Disease

## 2021-05-05 ENCOUNTER — Other Ambulatory Visit: Payer: Self-pay

## 2021-05-05 MED ORDER — POTASSIUM CHLORIDE ER 20 MEQ PO TBCR: 40.0000 meq | EXTENDED_RELEASE_TABLET | Freq: Two times a day (BID) | ORAL | 1 refills | Status: DC

## 2021-06-14 ENCOUNTER — Other Ambulatory Visit: Payer: Self-pay

## 2021-06-14 ENCOUNTER — Encounter: Payer: Self-pay | Admitting: Podiatry

## 2021-06-14 ENCOUNTER — Ambulatory Visit: Payer: PRIVATE HEALTH INSURANCE | Admitting: Podiatry

## 2021-06-14 DIAGNOSIS — E119 Type 2 diabetes mellitus without complications: Secondary | ICD-10-CM

## 2021-06-14 DIAGNOSIS — M79674 Pain in right toe(s): Secondary | ICD-10-CM | POA: Diagnosis not present

## 2021-06-14 DIAGNOSIS — Z794 Long term (current) use of insulin: Secondary | ICD-10-CM

## 2021-06-14 DIAGNOSIS — N289 Disorder of kidney and ureter, unspecified: Secondary | ICD-10-CM

## 2021-06-14 DIAGNOSIS — B351 Tinea unguium: Secondary | ICD-10-CM

## 2021-06-14 DIAGNOSIS — M79675 Pain in left toe(s): Secondary | ICD-10-CM | POA: Diagnosis not present

## 2021-06-14 DIAGNOSIS — Q666 Other congenital valgus deformities of feet: Secondary | ICD-10-CM

## 2021-06-14 NOTE — Progress Notes (Signed)
This patient presents the office for preventative foot care services.  He says his big toenails have grown long and are painful to walk.  Patient also says he is here for diabetic shoes.  He says he was seen 3 months ago by Dr. Posey Pronto who told him he needed to obtain diabetic shoes.  This patient presents the office today with a paper stating he should receive diabetic shoes from his medical doctor.  Examination of his insurance reveals he has met cost. Patient says he is diabetic. On insulin. He presents the office today for preventative foot care services.  General Appearance  Alert, conversant and in no acute stress.  Vascular  Dorsalis pedis and posterior tibial  pulses are palpable  bilaterally.  Capillary return is within normal limits  bilaterally. Temperature is within normal limits  bilaterally.  Neurologic  Senn-Weinstein monofilament wire test within normal limits  bilaterally. Muscle power within normal limits bilaterally.  Nails Thick disfigured discolored nails with subungual debris  hallux  B/L.Marland Kitchen No evidence of bacterial infection or drainage bilaterally.  Orthopedic  No limitations of motion  feet .  No crepitus or effusions noted.  No bony pathology or digital deformities noted.  Pes planovalgus.  HAV  B/L.  Hammer toes  B/L.  Skin  normotropic skin with no porokeratosis noted bilaterally.  No signs of infections or ulcers noted.     Onychomycosis hallux  B/L   ROV.  Debride nails with nail nipper and dremel tool.  Discussed diabetic shoes with this patient's.  According to Dr. Serita Grit notes he was to make an appointment with the pedorthist for diabetic shoes.  Patient believes that that appointment is today but in Dr. Serita Grit notes he told him to return in 3 months for nail care.  I proceeded to discuss diabetic shoes with Dawn who says that it is a diabetic program and that she will need to call to check to see if there is coverage for his diabetic shoes.  She will then call the  patient and schedule him if it is covered.   RTC 3 months for nail care   Gardiner Barefoot DPM

## 2021-06-30 ENCOUNTER — Telehealth: Payer: Self-pay | Admitting: Podiatry

## 2021-06-30 NOTE — Telephone Encounter (Signed)
Per Chrissy @ [employer] are covered @ 80 % after 3500.00 deductible(met 0) out of pocket is 7500.00(met 0) diabetic shoes and inserts are covered @ 80% as well but no guarantee of payment as it says shoes are excluded but did not specify if it was diabetic shoes. No Josem Kaufmann is required.  Based on medical necessity.Plan runs on fiscal yr started 7.1.2022... Reference # [id number]  I also called pt and he is aware of coverage.

## 2021-07-17 ENCOUNTER — Other Ambulatory Visit: Payer: Self-pay | Admitting: Cardiovascular Disease

## 2021-07-31 ENCOUNTER — Other Ambulatory Visit: Payer: Self-pay | Admitting: Cardiovascular Disease

## 2021-08-30 ENCOUNTER — Other Ambulatory Visit (HOSPITAL_BASED_OUTPATIENT_CLINIC_OR_DEPARTMENT_OTHER): Payer: Self-pay | Admitting: *Deleted

## 2021-08-30 MED ORDER — POTASSIUM CHLORIDE ER 20 MEQ PO TBCR: 40.0000 meq | EXTENDED_RELEASE_TABLET | Freq: Two times a day (BID) | ORAL | 1 refills | Status: DC

## 2021-09-20 ENCOUNTER — Ambulatory Visit: Payer: PRIVATE HEALTH INSURANCE | Admitting: Podiatry

## 2021-09-27 ENCOUNTER — Other Ambulatory Visit (HOSPITAL_COMMUNITY): Payer: Self-pay | Admitting: Internal Medicine

## 2021-09-29 ENCOUNTER — Other Ambulatory Visit: Payer: Self-pay | Admitting: Cardiovascular Disease

## 2021-12-20 ENCOUNTER — Other Ambulatory Visit (HOSPITAL_COMMUNITY): Payer: Self-pay | Admitting: Internal Medicine

## 2021-12-27 ENCOUNTER — Telehealth: Payer: Self-pay | Admitting: Cardiovascular Disease

## 2021-12-27 MED ORDER — POTASSIUM CHLORIDE ER 20 MEQ PO TBCR: 40.0000 meq | EXTENDED_RELEASE_TABLET | Freq: Two times a day (BID) | ORAL | 1 refills | Status: DC

## 2021-12-27 NOTE — Telephone Encounter (Signed)
°*  STAT* If patient is at the pharmacy, call can be transferred to refill team.   1. Which medications need to be refilled? (please list name of each medication and dose if known) Potassium Chloride ER 20 MEQ TBCR   2. Which pharmacy/location (including street and city if local pharmacy) is medication to be sent to? [id number] - [place], [place] - [address] AT [place]  3. Do they need a 30 day or 90 day supply? 30 ds

## 2021-12-27 NOTE — Telephone Encounter (Signed)
Patient last seen 10/2020. Last potassium of 3.6 on 06/2021. Overdue for follow up. Called home phone, no answer, no VM. Called cell phone.   Tells me he last took his potassium this morning. Appt to see Coletta Memos, NP on 01/24/22. 30 day supply with 1 refill sent to pharmacy.   Loel Dubonnet, NP

## 2022-01-16 ENCOUNTER — Other Ambulatory Visit (HOSPITAL_COMMUNITY): Payer: Self-pay | Admitting: Internal Medicine

## 2022-01-17 NOTE — Progress Notes (Signed)
Cardiology Office Note:    Date:  01/24/2022   ID:  Brendan [name], DOB November 13, 1956, MRN [id number]  PCP:  Brendan Carol, MD   [facility] Providers Cardiologist:  None Advanced Heart Failure:  Brendan Bickers, MD      Referring MD: Brendan Carol, MD   Follow-up for chest discomfort, acute on chronic diastolic/systolic CHF and hypertension.  History of Present Illness:    Brendan [name] is a 66 y.o. male with a hx of HTN, internal hemorrhoids, systolic CHF, acute on chronic diastolic CHF, OSA on CPAP, allergic rhinitis, type 2 diabetes, renal insufficiency, glaucoma, morbid obesity, hypokalemia, gout, and chest discomfort.  He initially establish care with the hypertension clinic 8/21.  He was first diagnosed with hypertension in his 2s.  During his initial visit he reported that his blood pressure was easier to control when he was younger.  During evaluation his blood pressure was noted to be in the 200s over 100s.  He was struggling with pain.  He had been admitted to [facility] 5/21 with acute on chronic diastolic CHF.  He received IV diuresis and was discharged on torsemide.  He saw Dr. Britta [name] on 7/21 and his hydralazine was increased.  He was initially started on Entresto however, his insurance did not cover the medication.  During his first hypertension clinic visit his hydralazine was increased to 100 mg 3 times daily.  He followed up with pharmacy and was noted to be taking the medication 2 times per day.  He had also been taking telmisartan 2 times per day even though it had been scheduled daily.  He was instructed to cut back to once a day dosing with his telmisartan.  His hydralazine was transitioned to 3 times daily.  He was seen by Dr. Britta [name] on 10/21 and his blood pressure was well controlled.  He had been off of spironolactone and was not restarted due to chronic kidney disease.  He was not checking his blood pressure at home.  His weight had increased from 298 pounds to 309  pounds.  He was not weighing himself daily.  He reported that his breathing was stable and denied orthopnea and PND.  He was not able to exercise due to pain.  He was trying to walk some daily.  He was noted to have ankle edema but did deny orthopnea PND.  He reported struggling with constipation.  He presents to the clinic today for follow-up evaluation states he feels well. He has not been monitoring his blood pressure at home. He is limited in his physical activity due to his ankle and knee pain. He reports that he has been walking some more at home and doing chores around his house. He has been eating smaller meals and avoiding salt. I will have him maintain a blood pressure log, order a BMP, refill his potassium, have him increase his physical activity as tolerated and plan f/u in 9-12 months.  Today he denies chest pain, shortness of breath, lower extremity edema, fatigue, palpitations, melena, hematuria, hemoptysis, diaphoresis, weakness, presyncope, syncope, orthopnea, and PND.   Previous antihypertensives: Cozaar/losartan  Past Medical History:  Diagnosis Date   Acanthosis nigricans    Atopic dermatitis    Diabetes ([place]) 2004   Erectile dysfunction    Fatty liver 2007   Glaucoma 2008   Gynecomastia 2009   Hyperaldosteronism ([place]) 2000   Hypercholesterolemia    Hyperlipidemia    Hypertension    Hypoglycemic reaction    Hypothyroidism 2004  Incontinence    Intermittent vertigo 2012   Left cervical radiculopathy 2012   Lumbar radiculopathy    Obesity    Peripheral neuropathy    Pollen allergies 2007   perennial   Prostate cancer ([place]) 2006   Reflux esophagitis 1995   Sickle cell trait ([place]) 2006   Sleep apnea, obstructive 2000   uses a cpap   Venous insufficiency 2005   Vitamin D deficiency 2012    Past Surgical History:  Procedure Laterality Date   COLONOSCOPY  2006   normal   lap band surgery  2009   left inguinal hernia repair  1998   robotic prostatectomy   2008    Current Medications: Current Meds  Medication Sig   acetaminophen (TYLENOL) 500 MG tablet Take 500 mg by mouth every 6 (six) hours as needed for mild pain.   Blood Glucose Monitoring Suppl (ONE TOUCH ULTRA SYSTEM KIT) W/DEVICE KIT 1 kit by Does not apply route once. One touch ultrasoft lancets   brimonidine-timolol (COMBIGAN) 0.2-0.5 % ophthalmic solution Place 1 drop into both eyes every 12 (twelve) hours.   carvedilol (COREG) 25 MG tablet Take 1 tablet (25 mg total) by mouth 2 (two) times daily. (Patient taking differently: Take 25 mg by mouth daily.)   FARXIGA 10 MG TABS tablet Take 10 mg by mouth daily.   Febuxostat 80 MG TABS Take 80 mg by mouth daily.   hydrALAZINE (APRESOLINE) 100 MG tablet TAKE 1 TABLET BY MOUTH THREE TIMES A DAY   Insulin Lispro Prot & Lispro (HUMALOG MIX 75/25 [place]) Inject 60-100 Units into the skin daily. humalog mix 75/25 injection 60-100 units q am sliding scale. Take 60 units if sugar is <180, then take 100 units if sugar is over 400   KERENDIA 20 MG TABS Take 1 tablet by mouth every morning.   levothyroxine (SYNTHROID) 137 MCG tablet Take 137 mcg by mouth every morning.   Potassium Chloride ER 20 MEQ TBCR Take 40 mEq by mouth 2 (two) times daily.   simvastatin (ZOCOR) 20 MG tablet Take 20 mg by mouth every evening.   telmisartan (MICARDIS) 80 MG tablet TAKE 1 TABLET (80 MG TOTAL) BY MOUTH IN THE MORNING AND AT BEDTIME.   torsemide (DEMADEX) 20 MG tablet Take 3 tablets (60 mg total) by mouth 2 (two) times daily. LAST REFILL PLEASE CALL OFFICE FOR FOLLOW UP APPOINTMENT TO RECEIVE MORE REFILLS   TRULICITY 3 VZ/8.5YI SOPN Inject 3.5 mg into the skin once a week.     Allergies:   Ace inhibitors, Maxidex [dexamethasone], and Verapamil   Social History   Socioeconomic History   Marital status: Married    Spouse name: Not on file   Number of children: 3   Years of education: Not on file   Highest education level: Not on file  Occupational History    Occupation: truck driver    Comment: Retired Economist  Tobacco Use   Smoking status: Never   Smokeless tobacco: Never  Vaping Use   Vaping Use: Never used  Substance and Sexual Activity   Alcohol use: Yes    Comment: one drink every few months   Drug use: No   Sexual activity: Not on file  Other Topics Concern   Not on file  Social History Narrative   Right handed   Lives in a two story home    Drinks no caffeine    Social Determinants of Health   Financial Resource Strain: Not on file  Food Insecurity: Not on file  Transportation Needs: Not on file  Physical Activity: Not on file  Stress: Not on file  Social Connections: Not on file     Family History: The patient's family history includes COPD in his sister; CVA in his sister; Diabetes in his daughter; Heart attack in his father; Heart failure in his mother; Hypertension in his father. There is no history of Colon cancer or Stomach cancer.  ROS:   Please see the history of present illness.     All other systems reviewed and are negative.   Risk Assessment/Calculations:           Physical Exam:    VS:  BP 122/74 (BP Location: Left Arm, Patient Position: Sitting, Cuff Size: Large)    Pulse 71    Ht 6' 2"  (1.88 m)    Wt (!) 300 lb 14.4 oz (136.5 kg)    BMI 38.63 kg/m     Wt Readings from Last 3 Encounters:  01/24/22 (!) 300 lb 14.4 oz (136.5 kg)  02/13/21 (!) 312 lb (141.5 kg)  11/08/20 (!) 308 lb 6.4 oz (139.9 kg)     GEN:  Well nourished, well developed in no acute distress HEENT: Normal NECK: No JVD; No carotid bruits LYMPHATICS: No lymphadenopathy CARDIAC: RRR, no murmurs, rubs, gallops RESPIRATORY:  Clear to auscultation without rales, wheezing or rhonchi  ABDOMEN: Soft, non-tender, non-distended MUSCULOSKELETAL:  No edema; No deformity  SKIN: Warm and dry NEUROLOGIC:  Alert and oriented x 3 PSYCHIATRIC:  Normal affect    EKGs/Labs/Other Studies Reviewed:    The following studies were  reviewed today:  Echocardiogram 03/26/2020 IMPRESSIONS     1. Left ventricular ejection fraction, by estimation, is 55 to 60%. The  left ventricle has normal function. Left ventricular endocardial border  not optimally defined to evaluate regional wall motion. There is moderate  left ventricular hypertrophy. Left  ventricular diastolic parameters are consistent with Grade II diastolic  dysfunction (pseudonormalization). Elevated left ventricular end-diastolic  pressure.   2. Right ventricular systolic function is normal. The right ventricular  size is normal. Tricuspid regurgitation signal is inadequate for assessing  PA pressure.   3. The mitral valve is abnormal. Trivial mitral valve regurgitation.   4. The aortic valve was not well visualized. Aortic valve regurgitation  is not visualized.   5. The inferior vena cava is dilated in size with <50% respiratory  variability, suggesting right atrial pressure of 15 mmHg.  EKG:  EKG is  ordered today.  The ekg ordered today demonstrates normal sinus rhythm left axis deviation 71 BPM  Recent Labs: No results found for requested labs within last 8760 hours.  Recent Lipid Panel No results found for: CHOL, TRIG, HDL, CHOLHDL, VLDL, LDLCALC, LDLDIRECT  ASSESSMENT & PLAN    Resistant hypertension-BP today 122/74.  Spironolactone previously stopped due to worsening renal function Continue hydralazine 3 times daily, torsemide, carvedilol, telmisartan Heart healthy low-sodium diet-salty 6 given Increase physical activity as tolerated Maintain blood pressure log Repeat BMP   Secondary Causes of Hypertension   Medications/Herbal: OCP, steroids, stimulants, antidepressants, weight loss medication, immune suppressants, NSAIDs, sympathomimetics, alcohol, caffeine, licorice, ginseng, St. John's wort, chemo   Sleep Apnea: Continue CPAP Renal artery stenosis: No renal artery stenosis 07/2020.  Severe celiac artery stenosis.  Patient is  asymptomatic. Hyperaldosteronism: no ademona on CT in 2007.  Consider checking renin Hyper/hypothyroidism: need to check TSH at follow up Pheochromocytoma: (testing not indicated) Cushing's syndrome: (testing not indicated)  Coarctation  of the aorta: BP symmetric  Chronic systolic and diastolic CHF-euvolemic today.  Weight today 300. Continue torsemide, carvedilol Heart healthy low-sodium diet Increase physical activity as tolerated  OSA-reports compliance with CPAP.  Waking up well rested. Continue weight loss Continue CPAP use   Hyperlipidemia-LDL 92. Continue simvastatin Heart healthy low-sodium high-fiber diet Increase physical activity as tolerated Follows with PCP  Type 2 diabetes-glucose 243 on 09/16/2020 Continue insulin, Trulicity Increase physical activity as tolerated Heart healthy low-sodium carb modified diet Follows with PCP  Disposition: Follow-up with Dr. Oval Linsey or me in 9-12 months.       Medication Adjustments/Labs and Tests Ordered: Current medicines are reviewed at length with the patient today.  Concerns regarding medicines are outlined above.  No orders of the defined types were placed in this encounter.  No orders of the defined types were placed in this encounter.   There are no Patient Instructions on file for this visit.   Signed, Deberah Pelton, NP  01/24/2022 10:51 AM      Notice: This dictation was prepared with Dragon dictation along with smaller phrase technology. Any transcriptional errors that result from this process are unintentional and may not be corrected upon review.  I spent 14 minutes examining this patient, reviewing medications, and using patient centered shared decision making involving her cardiac care.  Prior to her visit I spent greater than 20 minutes reviewing her past medical history,  medications, and prior cardiac tests.

## 2022-01-24 ENCOUNTER — Other Ambulatory Visit: Payer: Self-pay

## 2022-01-24 ENCOUNTER — Encounter (HOSPITAL_BASED_OUTPATIENT_CLINIC_OR_DEPARTMENT_OTHER): Payer: Self-pay | Admitting: General Practice

## 2022-01-24 ENCOUNTER — Ambulatory Visit (HOSPITAL_BASED_OUTPATIENT_CLINIC_OR_DEPARTMENT_OTHER): Payer: PRIVATE HEALTH INSURANCE | Admitting: General Practice

## 2022-01-24 VITALS — BP 122/74 | HR 71 | Ht 74.0 in | Wt 300.9 lb

## 2022-01-24 DIAGNOSIS — I5032 Chronic diastolic (congestive) heart failure: Secondary | ICD-10-CM | POA: Diagnosis not present

## 2022-01-24 DIAGNOSIS — Z9989 Dependence on other enabling machines and devices: Secondary | ICD-10-CM

## 2022-01-24 DIAGNOSIS — E782 Mixed hyperlipidemia: Secondary | ICD-10-CM

## 2022-01-24 DIAGNOSIS — G4733 Obstructive sleep apnea (adult) (pediatric): Secondary | ICD-10-CM | POA: Diagnosis not present

## 2022-01-24 DIAGNOSIS — I10 Essential (primary) hypertension: Secondary | ICD-10-CM | POA: Diagnosis not present

## 2022-01-24 MED ORDER — POTASSIUM CHLORIDE ER 20 MEQ PO TBCR: 40.0000 meq | EXTENDED_RELEASE_TABLET | Freq: Two times a day (BID) | ORAL | 3 refills | Status: DC

## 2022-01-24 NOTE — Patient Instructions (Signed)
Medication Instructions:  Your Physician recommend you continue on your current medication as directed.    *If you need a refill on your cardiac medications before your next appointment, please call your pharmacy*   Lab Work: Your physician recommends that you return for lab work today- BMET   If you have labs (blood work) drawn today and your tests are completely normal, you will receive your results only by: MyChart Message (if you have MyChart) OR A paper copy in the mail If you have any lab test that is abnormal or we need to change your treatment, we will call you to review the results.   Testing/Procedures: None ordered today    Follow-Up: At [facility], you and your health needs are our priority.  As part of our continuing mission to provide you with exceptional heart care, we have created designated Provider Care Teams.  These Care Teams include your primary Cardiologist (physician) and Advanced Practice Providers (APPs -  Physician Assistants and Nurse Practitioners) who all work together to provide you with the care you need, when you need it.  We recommend signing up for the patient portal called "MyChart".  Sign up information is provided on this After Visit Summary.  MyChart is used to connect with patients for Virtual Visits (Telemedicine).  Patients are able to view lab/test results, encounter notes, upcoming appointments, etc.  Non-urgent messages can be sent to your provider as well.   To learn more about what you can do with MyChart, go to [email or web address].    Your next appointment:   9-12 month(s)  The format for your next appointment:   In Person  Provider:   Dr. Skeet Latch or Coletta Memos, NP  {  Other Instructions Tips to Measure your Blood Pressure Correctly   Please check your BP 2x per week and keep it on  a log    To determine whether you have hypertension, a medical professional will take a blood pressure reading. How you prepare  for the test, the position of your arm, and other factors can change a blood pressure reading by 10% or more. That could be enough to hide high blood pressure, start you on a drug you don't really need, or lead your doctor to incorrectly adjust your medications.  National and international guidelines offer specific instructions for measuring blood pressure. If a doctor, nurse, or medical assistant isn't doing it right, don't hesitate to ask him or her to get with the guidelines.  Here's what you can do to ensure a correct reading:  Don't drink a caffeinated beverage or smoke during the 30 minutes before the test.  Sit quietly for five minutes before the test begins.  During the measurement, sit in a chair with your feet on the floor and your arm supported so your elbow is at about heart level.  The inflatable part of the cuff should completely cover at least 80% of your upper arm, and the cuff should be placed on bare skin, not over a shirt.  Don't talk during the measurement.  Have your blood pressure measured twice, with a brief break in between. If the readings are different by 5 points or more, have it done a third time.  In 2017, new guidelines from the [place], the [employer] of Cardiology, and nine other health organizations lowered the diagnosis of high blood pressure to 130/80 mm Hg or higher for all adults. The guidelines also redefined the various blood pressure categories to now  include normal, elevated, Stage 1 hypertension, Stage 2 hypertension, and hypertensive crisis (see "Blood pressure categories").  Blood pressure categories  Blood pressure category SYSTOLIC (upper number)  DIASTOLIC (lower number)  Normal Less than 120 mm Hg and Less than 80 mm Hg  Elevated 120-129 mm Hg and Less than 80 mm Hg  High blood pressure: Stage 1 hypertension 130-139 mm Hg or 80-89 mm Hg  High blood pressure: Stage 2 hypertension 140 mm Hg or higher or 90 mm Hg or higher   Hypertensive crisis (consult your doctor immediately) Higher than 180 mm Hg and/or Higher than 120 mm Hg  Source: American Heart Association and American Stroke Association. For more on getting your blood pressure under control, buy Controlling Your Blood Pressure, a Special Health Report from [facility].   Blood Pressure Log   Date   Time  Blood Pressure  Position  Example: Nov 1 9 AM 124/78 sitting                                                     Food Basics for Chronic Kidney Disease Chronic kidney disease (CKD) occurs when the kidneys are permanently damaged over a long period of time. When your kidneys are not working well, they cannot remove waste, fluids, and other substances from your blood as well as they did before. The substances can build up, which can worsen kidney damage and affect how your body functions. Certain foods lead to a buildup of these substances. By changing your diet, you can help prevent more kidney damage and delay or prevent the need for dialysis. What are tips for following this plan? Reading food labels Check the amount of salt (sodium) in foods. Choose foods that have less than 300 milligrams (mg) per serving. Check the ingredient list for phosphorus or potassium-based additives or preservatives. Check the amount of saturated fat and trans fat. Limit or avoid these fats as told by your dietitian. Shopping Avoid buying foods that are: Processed or prepackaged. Calcium-enriched or that have calcium added to them (are fortified). Do not buy foods that have salt or sodium listed among the first five ingredients. Buy canned vegetables and beans that say "no salt added" or "low sodium" and rinse them before eating. Cooking Soak vegetables, such as potatoes, before cooking to reduce potassium. To do this: Peel and cut the vegetables into small pieces. Soak the vegetables in warm water for at least 2 hours. For every 1 cup of  vegetables, use 10 cups of water. Drain and rinse the vegetables with warm water. Boil the vegetables for at least 5 minutes. Meal planning Limit the amount of protein you eat from plant and animal sources each day. Do not add salt to food when cooking or before eating. Eat meals and snacks at around the same time each day. General information Talk with your health care provider about whether you should take a vitamin and mineral supplement. Use standard measuring cups and spoons to measure servings of foods. Use a kitchen scale to measure portions of protein foods. If told by your health care provider, avoid drinking too much fluid. Measure and count all liquids, including water, ice, soups, flavored gelatin, and frozen desserts such as ice pops or ice cream. If you have diabetes: If you have diabetes (diabetes mellitus) and CKD, it is  important to keep your blood sugar (glucose) in the target range recommended by your health care provider. Follow your diabetes management plan. This may include: Checking your blood glucose regularly. Taking medicines by mouth, taking insulin, or taking both. Exercising for at least 30 minutes on 5 or more days each week, or as told by your health care provider. Tracking how many servings of carbohydrates you eat at each meal. You may be given specific guidelines on how much of certain foods and nutrients you may eat, depending on your stage of kidney disease and whether you have high blood pressure (hypertension). Follow your meal plan as told by your dietitian. What nutrients should I limit? Work with your health care provider and dietitian to develop a meal plan that is right for you. Foods you can eat and foods you should limit or avoid will depend on the stage of your kidney disease and any other health conditions you have. The items listed below are not a complete list. Talk with your dietitian about what dietary choices are best for  you. Potassium Potassium affects how steadily your heart beats. If too much potassium builds up in your blood, the potassium can cause an irregular heartbeat or even a heart attack. You may need to limit or avoid foods that are high in potassium, such as: Milk and soy milk. Fruits, such as bananas, apricots, nectarines, melon, prunes, raisins, kiwi, and oranges. Vegetables, such as potatoes, sweet potatoes, yams, tomatoes, leafy greens, beets, avocado, pumpkin, and winter squash. White and lima beans. Whole-wheat breads and pastas. Beans and nuts. Phosphorus Phosphorus is a mineral found in your bones. A balance between calcium and phosphorus is needed to build and maintain healthy bones. Too much phosphorus pulls calcium from your bones. This can make your bones weak and more likely to break. Too much phosphorus can also make your skin itch. You may need to limit or avoid foods that are high in phosphorus, such as: Milk and dairy products. Dried beans and peas. Tofu, soy milk, and other soy-based meat replacements. Dark-colored sodas. Nuts and peanut butter. Meat, poultry, and fish. Bran cereals and oatmeal. Protein Protein helps you make and keep muscle. It also helps to repair your body's cells and tissues. One of the natural breakdown products of protein is a waste product called urea. When your kidneys are not working properly, they cannot remove wastes, such as urea. Reducing how much protein you eat can help prevent a buildup of urea in your blood. Depending on your stage of kidney disease, you may need to limit foods that are high in protein. Sources of animal protein include: Meat (all types). Fish and seafood. Poultry. Eggs. Dairy. Other protein foods include: Beans and legumes. Nuts and nut butter. Soy and tofu.  Sodium Sodium helps to maintain a healthy balance of fluids in your body. Too much sodium can increase your blood pressure and have a negative effect on your  heart and lungs. Too much sodium can also cause your body to retain too much fluid, making your kidneys work harder. Most people should have less than 2,300 mg of sodium each day. If you have hypertension, you may need to limit your sodium to 1,500 mg each day. You may need to limit or avoid foods that are high in sodium, such as: Salt seasonings. Soy sauce. Cured and processed meats. Salted crackers and snack foods. Fast food. Canned soups and most canned foods. Pickled foods. Vegetable juice. Boxed mixes or ready-to-eat boxed meals  and side dishes. Bottled dressings, sauces, and marinades. Talk with your dietitian about how much potassium, phosphorus, protein, and sodium you may have each day. Summary Chronic kidney disease (CKD) can lead to a buildup of waste and extra substances in the body. Certain foods lead to a buildup of these substances. By changing your diet as told, you can help prevent more kidney damage and delay or prevent the need for dialysis. Food intake changes are different for each person with CKD. Work with a dietitian to set up nutrient goals and a meal plan that is right for you. If you have diabetes and CKD, it is important to keep your blood sugar in the target range recommended by your health care provider. This information is not intended to replace advice given to you by your health care provider. Make sure you discuss any questions you have with your health care provider. Document Revised: 02/29/2020 Document Reviewed: 02/29/2020 Elsevier Patient Education  2022 [employer].

## 2022-01-25 LAB — BASIC METABOLIC PANEL
BUN/Creatinine Ratio: 16 (ref 10–24)
BUN: 39 mg/dL — ABNORMAL HIGH (ref 8–27)
CO2: 23 mmol/L (ref 20–29)
Calcium: 9.2 mg/dL (ref 8.6–10.2)
Chloride: 101 mmol/L (ref 96–106)
Creatinine, Ser: 2.46 mg/dL — ABNORMAL HIGH (ref 0.76–1.27)
Glucose: 319 mg/dL — ABNORMAL HIGH (ref 70–99)
Potassium: 4.7 mmol/L (ref 3.5–5.2)
Sodium: 138 mmol/L (ref 134–144)
eGFR: 28 mL/min/{1.73_m2} — ABNORMAL LOW (ref >59)

## 2022-01-30 ENCOUNTER — Other Ambulatory Visit (HOSPITAL_COMMUNITY): Payer: Self-pay | Admitting: Internal Medicine

## 2022-02-26 ENCOUNTER — Other Ambulatory Visit (HOSPITAL_COMMUNITY): Payer: Self-pay | Admitting: Internal Medicine

## 2022-02-27 ENCOUNTER — Encounter (HOSPITAL_COMMUNITY): Payer: Self-pay | Admitting: Internal Medicine

## 2022-02-28 MED ORDER — TORSEMIDE 20 MG PO TABS: 60.0000 mg | ORAL_TABLET | Freq: Two times a day (BID) | ORAL | 0 refills | Status: DC

## 2022-03-26 ENCOUNTER — Other Ambulatory Visit (HOSPITAL_COMMUNITY): Payer: Self-pay | Admitting: Internal Medicine

## 2022-03-31 ENCOUNTER — Other Ambulatory Visit: Payer: Self-pay | Admitting: Cardiovascular Disease

## 2022-04-03 ENCOUNTER — Other Ambulatory Visit (HOSPITAL_COMMUNITY): Payer: Self-pay | Admitting: Internal Medicine

## 2022-04-18 ENCOUNTER — Other Ambulatory Visit (HOSPITAL_BASED_OUTPATIENT_CLINIC_OR_DEPARTMENT_OTHER): Payer: Self-pay | Admitting: General Practice

## 2022-04-21 ENCOUNTER — Encounter (HOSPITAL_BASED_OUTPATIENT_CLINIC_OR_DEPARTMENT_OTHER): Payer: Self-pay | Admitting: Emergency Medicine

## 2022-04-21 ENCOUNTER — Other Ambulatory Visit: Payer: Self-pay

## 2022-04-21 ENCOUNTER — Emergency Department (HOSPITAL_BASED_OUTPATIENT_CLINIC_OR_DEPARTMENT_OTHER)
Admission: EM | Admit: 2022-04-21 | Discharge: 2022-04-21 | Disposition: A | Discharge status: Home / Self Care | Payer: PRIVATE HEALTH INSURANCE | Attending: Emergency Medicine | Admitting: Emergency Medicine

## 2022-04-21 DIAGNOSIS — N189 Chronic kidney disease, unspecified: Secondary | ICD-10-CM | POA: Insufficient documentation

## 2022-04-21 DIAGNOSIS — R531 Weakness: Secondary | ICD-10-CM | POA: Diagnosis present

## 2022-04-21 DIAGNOSIS — M79661 Pain in right lower leg: Secondary | ICD-10-CM | POA: Diagnosis not present

## 2022-04-21 DIAGNOSIS — Z79899 Other long term (current) drug therapy: Secondary | ICD-10-CM | POA: Insufficient documentation

## 2022-04-21 DIAGNOSIS — E119 Type 2 diabetes mellitus without complications: Secondary | ICD-10-CM | POA: Insufficient documentation

## 2022-04-21 DIAGNOSIS — Z794 Long term (current) use of insulin: Secondary | ICD-10-CM | POA: Insufficient documentation

## 2022-04-21 DIAGNOSIS — M6281 Muscle weakness (generalized): Secondary | ICD-10-CM

## 2022-04-21 DIAGNOSIS — M79662 Pain in left lower leg: Secondary | ICD-10-CM | POA: Insufficient documentation

## 2022-04-21 DIAGNOSIS — M79604 Pain in right leg: Secondary | ICD-10-CM

## 2022-04-21 LAB — COMPREHENSIVE METABOLIC PANEL
ALT: 13 U/L (ref 0–44)
AST: 14 U/L — ABNORMAL LOW (ref 15–41)
Albumin: 3.5 g/dL (ref 3.5–5.0)
Alkaline Phosphatase: 74 U/L (ref 38–126)
Anion gap: 12 (ref 5–15)
BUN: 53 mg/dL — ABNORMAL HIGH (ref 8–23)
CO2: 27 mmol/L (ref 22–32)
Calcium: 9.6 mg/dL (ref 8.9–10.3)
Chloride: 94 mmol/L — ABNORMAL LOW (ref 98–111)
Creatinine, Ser: 2.9 mg/dL — ABNORMAL HIGH (ref 0.61–1.24)
GFR, Estimated: 23 mL/min — ABNORMAL LOW (ref >60)
Glucose, Bld: 302 mg/dL — ABNORMAL HIGH (ref 70–99)
Potassium: 4.2 mmol/L (ref 3.5–5.1)
Sodium: 133 mmol/L — ABNORMAL LOW (ref 135–145)
Total Bilirubin: 0.8 mg/dL (ref 0.3–1.2)
Total Protein: 7.6 g/dL (ref 6.5–8.1)

## 2022-04-21 LAB — CBC WITH DIFFERENTIAL/PLATELET
Abs Immature Granulocytes: 0.08 10*3/uL — ABNORMAL HIGH (ref 0.00–0.07)
Basophils Absolute: 0.1 10*3/uL (ref 0.0–0.1)
Basophils Relative: 0 %
Eosinophils Absolute: 0.5 10*3/uL (ref 0.0–0.5)
Eosinophils Relative: 4 %
HCT: 34.6 % — ABNORMAL LOW (ref 39.0–52.0)
Hemoglobin: 11.4 g/dL — ABNORMAL LOW (ref 13.0–17.0)
Immature Granulocytes: 1 %
Lymphocytes Relative: 7 %
Lymphs Abs: 0.9 10*3/uL (ref 0.7–4.0)
MCH: 26.6 pg (ref 26.0–34.0)
MCHC: 32.9 g/dL (ref 30.0–36.0)
MCV: 80.7 fL (ref 80.0–100.0)
Monocytes Absolute: 1.2 10*3/uL — ABNORMAL HIGH (ref 0.1–1.0)
Monocytes Relative: 9 %
Neutro Abs: 10.6 10*3/uL — ABNORMAL HIGH (ref 1.7–7.7)
Neutrophils Relative %: 79 %
Platelets: 433 10*3/uL — ABNORMAL HIGH (ref 150–400)
RBC: 4.29 MIL/uL (ref 4.22–5.81)
RDW: 13.6 % (ref 11.5–15.5)
WBC: 13.4 10*3/uL — ABNORMAL HIGH (ref 4.0–10.5)
nRBC: 0 % (ref 0.0–0.2)

## 2022-04-21 LAB — CK: Total CK: 101 U/L (ref 49–397)

## 2022-04-21 MED ORDER — FENTANYL CITRATE PF 50 MCG/ML IJ SOSY
50.0000 ug | PREFILLED_SYRINGE | Freq: Once | INTRAMUSCULAR | Status: AC
  Administered 2022-04-21: 50 ug via INTRAVENOUS
  Filled 2022-04-21: qty 1

## 2022-04-21 MED ORDER — FENTANYL CITRATE PF 50 MCG/ML IJ SOSY
100.0000 ug | PREFILLED_SYRINGE | Freq: Once | INTRAMUSCULAR | Status: AC
  Administered 2022-04-21: 100 ug via INTRAVENOUS
  Filled 2022-04-21: qty 2

## 2022-04-21 NOTE — ED Triage Notes (Signed)
Pt arrives to ED with c/o leg weakness. Pt reports that this started on 5/30, x5 days ago. Pt reports his leg weakness has gradually become worse. He does report pain in both legs that he describes as sharp and constant. He reports fall x7 days ago with injured shoulder. He denies urinary incontinence. Hx peripheral neuropathy.

## 2022-04-21 NOTE — ED Provider Notes (Signed)
[facility] EMERGENCY DEPT Provider Note   CSN: [id number] Arrival date & time: 04/21/22  5997     History  Chief Complaint  Patient presents with   Weakness    Brendan [name] is a 66 y.o. male.  HPI     66 year old male comes in with chief complaint of weakness. Patient has history of diabetes, lumbar radiculopathy, venous insufficiency, recent fall with left thoracic fracture.  Patient states that his fracture occurred 3 days ago.  He has been laying in the bed for the last 3 days.  Today when he got up, he felt significantly weak in both of his legs and was having difficulty in walking.  He also has increased pain in his knees.  Patient has no associated back pain. Pt has no associated numbness, weakness, urinary incontinence, urinary retention, bowel incontinence, pins and needle sensation in the perineal area.  Patient also thinks that his difficulty in walking is because of pain.  Pain is primarily over his knees.  He is taking Vicodin for pain control for his shoulder injury.  Home Medications Prior to Admission medications   Medication Sig Start Date End Date Taking? Authorizing Provider  acetaminophen (TYLENOL) 500 MG tablet Take 500 mg by mouth every 6 (six) hours as needed for mild pain.    [provider]  Blood Glucose Monitoring Suppl (ONE TOUCH ULTRA SYSTEM KIT) W/DEVICE KIT 1 kit by Does not apply route once. One touch ultrasoft lancets    [provider]  brimonidine-timolol (COMBIGAN) 0.2-0.5 % ophthalmic solution Place 1 drop into both eyes every 12 (twelve) hours.    [provider]  carvedilol (COREG) 25 MG tablet Take 1 tablet (25 mg total) by mouth 2 (two) times daily. Patient taking differently: Take 25 mg by mouth daily. 08/25/20   Skeet Latch, MD  FARXIGA 10 MG TABS tablet Take 10 mg by mouth daily. 01/21/21   [provider]  Febuxostat 80 MG TABS Take 80 mg by mouth daily. 02/09/21   [provider]  hydrALAZINE (APRESOLINE) 100 MG tablet TAKE 1 TABLET BY MOUTH THREE TIMES A DAY 09/29/21   Skeet Latch, MD  Insulin Lispro Prot & Lispro (HUMALOG MIX 75/25 [place]) Inject 60-100 Units into the skin daily. humalog mix 75/25 injection 60-100 units q am sliding scale. Take 60 units if sugar is <180, then take 100 units if sugar is over 400    [provider]  KERENDIA 20 MG TABS Take 1 tablet by mouth every morning. 12/22/21   [provider]  levothyroxine (SYNTHROID) 137 MCG tablet Take 137 mcg by mouth every morning. 01/08/22   [provider]  Potassium Chloride ER 20 MEQ TBCR TAKE 2 TABLETS TWICE A DAY 04/18/22   Bensimhon, Shaune Pascal, MD  simvastatin (ZOCOR) 20 MG tablet Take 20 mg by mouth every evening.    [provider]  telmisartan (MICARDIS) 80 MG tablet TAKE 1 TABLET (80 MG TOTAL) BY MOUTH IN THE MORNING AND AT BEDTIME. 04/02/22   Skeet Latch, MD  torsemide (DEMADEX) 20 MG tablet Take 3 tablets (60 mg total) by mouth 2 (two) times daily. Absolute last refill without office visit please call [phone] to schedule 03/26/22   Bensimhon, Shaune Pascal, MD  TRULICITY 3 YE/3.3ID SOPN Inject 3.5 mg into the skin once a week. 03/05/20   [provider]      Allergies    Ace inhibitors, Maxidex [dexamethasone], and Verapamil    Review of Systems  Review of Systems  All other systems reviewed and are negative.  Physical Exam Updated Vital Signs BP (!) 143/74   Pulse (!) 59   Temp 97.7 F (36.5 C) (Oral)   Resp 14   Ht 6' 2"  (1.88 m)   Wt 133.8 kg   SpO2 97%   BMI 37.88 kg/m  Physical Exam Vitals and nursing note reviewed.  Constitutional:      Appearance: He is well-developed.  HENT:     Head: Atraumatic.  Eyes:     Extraocular Movements: Extraocular movements intact.     Pupils: Pupils are equal, round, and reactive to light.  Cardiovascular:     Rate and Rhythm: Normal rate.  Pulmonary:     Effort: Pulmonary effort  is normal.  Abdominal:     Tenderness: There is no abdominal tenderness.  Musculoskeletal:     Cervical back: Neck supple.     Comments: Pt has NO tenderness over the lumbar region No step offs, no erythema. Unable to elicit patellar reflex given the body habitus Able to discriminate between sharp and dull. Able to ambulate Bilateral lower extremity strength is 4+ out of 5 Patient is able to flex the knee   Skin:    General: Skin is warm.  Neurological:     Mental Status: He is alert and oriented to person, place, and time.    ED Results / Procedures / Treatments   Labs (all labs ordered are listed, but only abnormal results are displayed) Labs Reviewed  COMPREHENSIVE METABOLIC PANEL - Abnormal; Notable for the following components:      Result Value   Sodium 133 (*)    Chloride 94 (*)    Glucose, Bld 302 (*)    BUN 53 (*)    Creatinine, Ser 2.90 (*)    AST 14 (*)    GFR, Estimated 23 (*)    All other components within normal limits  CBC WITH DIFFERENTIAL/PLATELET - Abnormal; Notable for the following components:   WBC 13.4 (*)    Hemoglobin 11.4 (*)    HCT 34.6 (*)    Platelets 433 (*)    Neutro Abs 10.6 (*)    Monocytes Absolute 1.2 (*)    Abs Immature Granulocytes 0.08 (*)    All other components within normal limits  CK    EKG EKG Interpretation  Date/Time:  Saturday April 21 2022 10:57:35 EDT Ventricular Rate:  61 PR Interval:  186 QRS Duration: 98 QT Interval:  442 QTC Calculation: 446 R Axis:   -38 Text Interpretation: Sinus rhythm Left axis deviation Abnormal R-wave progression, early transition Borderline T wave abnormalities No acute changes No significant change since last tracing Confirmed by Varney Biles [phone]) on 04/21/2022 2:03:18 PM  Radiology No results found.  Procedures Procedures    Medications Ordered in ED Medications  fentaNYL (SUBLIMAZE) injection 100 mcg (100 mcg Intravenous Given 04/21/22 1145)  fentaNYL (SUBLIMAZE) injection  50 mcg (50 mcg Intravenous Given 04/21/22 1425)    ED Course/ Medical Decision Making/ A&P Clinical Course as of 04/21/22 1519  Sat Apr 21, 2022  1400 Patient reassessed. He was given fentanyl, he indicated that his pain felt a lot better.  I ambulated the patient with the assistance of the nurse.  Patient was able to get up on his own power.  He was able to take few steps with cane.  Both the patient and the wife indicated that patient is walking a lot better.  Patient feels comfortable ambulating.  Denies any dizziness.  Feels that the strength is still not 100%, but it is a lot better than this morning.  He indicates that the pain improved dramatically with fentanyl, but is also slowly coming back now.  Both the patient and wife are comfortable going home.  Indicated that his symptoms could be a combination of pain and also deconditioning, as patient has been sitting for the last 2 or 3 days.  They have an appointment with orthopedist on Monday.  They will discuss physical therapy.  They will return to the ER if his symptoms are getting worse. [AN]  1518 CK CK, CBC, CMP ordered and interpreted independently. [AN]  1519 Creatinine(!): 2.90 Baseline elevation. [AN]    Clinical Course User Index [AN] Varney Biles, MD                           Medical Decision Making Amount and/or Complexity of Data Reviewed Labs: ordered.  Risk Prescription drug management.    This patient presents to the ED with chief complaint(s) of leg pain and weakness, difficulty in ambulation with pertinent past medical history of diabetes, lumbar radiculopathy, recent fall with fracture to his left shoulder and immobility since then which further complicates the presenting complaint. The complaint involves an extensive differential diagnosis and also carries with it a high risk of complications and morbidity.    Patient has no red flags suggesting cord compression or cauda equina.   The differential  diagnosis includes ascending weakness such as GBS, however patient has no sensory deficits.  Atypical GBS presentation still possible, electrolyte abnormality, myelitis because of disc herniation, severe deconditioning, DKA, severe anemia.  The initial plan is to order basic blood work-up and reassess.  Additional history obtained: Additional history obtained from [facility].  Treatment and Reassessment: Patient given fentanyl.  Reassessed after lab work-up resulted and is reassuring.  CK is normal as well.  Patient indicated that he felt better, was able to ambulate for [place].   Consideration for admission or further workup: Considered neurology consult, work-up for ascending myelopathy and MRI of the spine -but patient did not have any classic red flags suggesting disease process in the spine or spinal cord in patient's symptoms improved, therefore no consults or advanced imaging was completed.  The patient appears reasonably screened and/or stabilized for discharge and I doubt any other medical condition or other [facility] requiring further screening, evaluation, or treatment in the ED at this time prior to discharge.   Results from the ER workup discussed with the patient face to face and all questions answered to the best of my ability. The patient is safe for discharge with strict return precautions.   Final Clinical Impression(s) / ED Diagnoses Final diagnoses:  Muscle weakness  Pain in both lower extremities  Chronic kidney disease, unspecified CKD stage    Rx / DC Orders ED Discharge Orders     None         Varney Biles, MD 04/21/22 1520

## 2022-04-21 NOTE — ED Notes (Signed)
Discharge instructions and follow up care reviewed and explained, pt verbalized understanding and had no further questions on departure.

## 2022-04-21 NOTE — Discharge Instructions (Signed)
We saw in the ER for difficulty walking.  I suspect, that given that you have not been moving much the last few days, deconditioning could be playing a part.  Pain is another possibility influencing your mobility.  In the ER, you are given fentanyl and reassess.  You are able to walk a lot better.  Your lab work-up reveals no significant changes, no signs of infection and no evidence of muscle breakdown.  For now, we suspect deconditioning and pain are the primary reason for your weakness and pain.  Please discuss this with the orthopedist on Monday.  Request physical therapy session to ensure that your deconditioning does not get worse.

## 2022-04-22 ENCOUNTER — Other Ambulatory Visit: Payer: Self-pay | Admitting: Cardiovascular Disease

## 2022-04-23 NOTE — Telephone Encounter (Signed)
Rx request sent to pharmacy.  

## 2022-04-25 ENCOUNTER — Other Ambulatory Visit: Payer: Self-pay | Admitting: Cardiovascular Disease

## 2022-04-25 NOTE — Telephone Encounter (Signed)
Rx request sent to pharmacy.  

## 2022-06-28 DIAGNOSIS — S4292XA Fracture of left shoulder girdle, part unspecified, initial encounter for closed fracture: Secondary | ICD-10-CM | POA: Diagnosis not present

## 2022-06-28 DIAGNOSIS — M6281 Muscle weakness (generalized): Secondary | ICD-10-CM | POA: Diagnosis not present

## 2022-06-28 DIAGNOSIS — M25512 Pain in left shoulder: Secondary | ICD-10-CM | POA: Diagnosis not present

## 2022-07-06 DIAGNOSIS — M25512 Pain in left shoulder: Secondary | ICD-10-CM | POA: Diagnosis not present

## 2022-07-06 DIAGNOSIS — S4292XA Fracture of left shoulder girdle, part unspecified, initial encounter for closed fracture: Secondary | ICD-10-CM | POA: Diagnosis not present

## 2022-07-06 DIAGNOSIS — M6281 Muscle weakness (generalized): Secondary | ICD-10-CM | POA: Diagnosis not present

## 2022-07-09 DIAGNOSIS — E1142 Type 2 diabetes mellitus with diabetic polyneuropathy: Secondary | ICD-10-CM | POA: Diagnosis not present

## 2022-07-09 DIAGNOSIS — S42292D Other displaced fracture of upper end of left humerus, subsequent encounter for fracture with routine healing: Secondary | ICD-10-CM | POA: Diagnosis not present

## 2022-07-09 DIAGNOSIS — E7800 Pure hypercholesterolemia, unspecified: Secondary | ICD-10-CM | POA: Diagnosis not present

## 2022-07-09 DIAGNOSIS — N1832 Chronic kidney disease, stage 3b: Secondary | ICD-10-CM | POA: Diagnosis not present

## 2022-07-09 DIAGNOSIS — I5032 Chronic diastolic (congestive) heart failure: Secondary | ICD-10-CM | POA: Diagnosis not present

## 2022-07-09 DIAGNOSIS — Z794 Long term (current) use of insulin: Secondary | ICD-10-CM | POA: Diagnosis not present

## 2022-07-20 DIAGNOSIS — S4292XA Fracture of left shoulder girdle, part unspecified, initial encounter for closed fracture: Secondary | ICD-10-CM | POA: Diagnosis not present

## 2022-07-20 DIAGNOSIS — M25512 Pain in left shoulder: Secondary | ICD-10-CM | POA: Diagnosis not present

## 2022-07-20 DIAGNOSIS — M6281 Muscle weakness (generalized): Secondary | ICD-10-CM | POA: Diagnosis not present

## 2022-07-27 DIAGNOSIS — M6281 Muscle weakness (generalized): Secondary | ICD-10-CM | POA: Diagnosis not present

## 2022-07-27 DIAGNOSIS — M25512 Pain in left shoulder: Secondary | ICD-10-CM | POA: Diagnosis not present

## 2022-07-27 DIAGNOSIS — S4292XA Fracture of left shoulder girdle, part unspecified, initial encounter for closed fracture: Secondary | ICD-10-CM | POA: Diagnosis not present

## 2022-07-31 ENCOUNTER — Ambulatory Visit (INDEPENDENT_AMBULATORY_CARE_PROVIDER_SITE_OTHER): Payer: Self-pay | Admitting: Nurse Practitioner

## 2022-07-31 ENCOUNTER — Encounter: Payer: Self-pay | Admitting: Nurse Practitioner

## 2022-07-31 VITALS — BP 162/80 | HR 72 | Ht 74.0 in | Wt 312.0 lb

## 2022-07-31 DIAGNOSIS — H401131 Primary open-angle glaucoma, bilateral, mild stage: Secondary | ICD-10-CM | POA: Diagnosis not present

## 2022-07-31 DIAGNOSIS — G4733 Obstructive sleep apnea (adult) (pediatric): Secondary | ICD-10-CM

## 2022-07-31 DIAGNOSIS — I10 Essential (primary) hypertension: Secondary | ICD-10-CM

## 2022-07-31 NOTE — Addendum Note (Signed)
Addended by: Alvin Critchley on: 07/31/2022 10:10 AM   Modules accepted: Orders

## 2022-07-31 NOTE — Assessment & Plan Note (Signed)
Severe OSA with AHI 64/h from NPSG 2000. He has excellent compliance and control. He occasionally feels like pressure is high. We will tighten his settings to 8-15 from 5-20 cmH2O based on his requirements. He is due for new machine - orders placed today.   Patient Instructions  Continue CPAP every night, minimum of 4-6 hours a night.  Change equipment every 30 days or as directed by DME. Wash your tubing with warm soap and water daily, hang to dry. Wash humidifier portion weekly.  Be aware of reduced alertness and do not drive or operate heavy machinery if experiencing this or drowsiness.  Exercise encouraged, as tolerated. Avoid or decrease alcohol consumption and medications that make you more sleepy, if possible. Notify if persistent daytime sleepiness occurs even with consistent use of CPAP.  Orders sent for new CPAP machine; we will adjust your settings to 8-15 cmH2O.   Follow up with your PCP or heart doctor about your blood pressure; ensure you are staying <140/90  Follow up in 8-12 weeks with Katie Browning [place],NP to see how things are going on your new CPAP. If you have not received your new machine or have not been on it for at least 31 days, please call to reschedule your appointment.

## 2022-07-31 NOTE — Progress Notes (Signed)
Reviewed and agree with assessment/plan.   Chesley Mires, MD [facility] Pulmonary/Critical Care 07/31/2022, 9:53 AM Pager:  [phone]

## 2022-07-31 NOTE — Assessment & Plan Note (Signed)
Hypertensive in office. He reports that he took his medications this morning. Advised him to follow up with his PCP or cardiologist. Goal <140/90

## 2022-07-31 NOTE — Progress Notes (Signed)
_0  ID: Brendan [name], male    DOB: 1956-01-30, 66 y.o.   MRN: [id number]  Chief Complaint  Patient presents with   Consult    New CPAP machine.    Referring provider: Seward Carol, MD  HPI: 66 year old male, never smoker with severe sleep apnea on CPAP.  He was previously followed in our clinic by Dr. Gwenette Greet and last seen 10/29/2016.  Past medical history significant for hypertension, CHF, allergic rhinitis, hypothyroidism, DM 2, morbid obesity.  TEST/EVENTS:  NPSG 2000: AHI 64/h  07/31/2022: Today - sleep consult Patient presents today to re-establish care for his sleep apnea, referred by Dr. Delfina Redwood. He was last seen in our office in 2017. He has been on CPAP for many years now with excellent compliance. Wears it every night. He wakes feeling pretty rested in the mornings; occasionally he's still sleepy but no significant daytime fatigue symptoms throughout the day. He denies any drowsy driving, morning headaches, sleep parasomnias/paralysis, snoring. He wears a nasal mask, changes his mask out regularly. His machine is >35 years old and has recently started notifying him that his motor is going bad. He does notice some occasional leaks and sometimes pressures feel high. He goes to bed around 9 pm. Falls asleep within 30 min. Wakes usually twice a night to use the restroom. Officially gets up in the morning around 730 am. His weight is stable; fluctuates about 10 lb up or down. Past history of HTN, CHF. No hx of stroke.  He is retired. Lives with his wife. Never smoker. Does not drink alcohol.   [place] 8  07/01/2022-07/30/2022 CPAP 5-20 cmH2O Download 30/30 days; 93% >4 hr; av usage 8 hr 18 min Pressure median 11.4, 95th 13.9 Leaks median 5.8, 95th 11 AHI 0.7  Allergies  Allergen Reactions   Ace Inhibitors     REACTION: cough   Maxidex [Dexamethasone] Other (See Comments)    Sexual dysfunction   Verapamil     REACTION: constipation    Immunization History  Administered  Date(s) Administered   Influenza Whole 12/20/2009   Influenza,inj,Quad PF,6+ Mos 10/20/2013, 08/29/2015   Influenza-Unspecified 08/19/2014, 08/29/2016    Past Medical History:  Diagnosis Date   Acanthosis nigricans    Atopic dermatitis    Diabetes ([place]) 2004   Erectile dysfunction    Fatty liver 2007   Glaucoma 2008   Gynecomastia 2009   Hyperaldosteronism ([place]) 2000   Hypercholesterolemia    Hyperlipidemia    Hypertension    Hypoglycemic reaction    Hypothyroidism 2004   Incontinence    Intermittent vertigo 2012   Left cervical radiculopathy 2012   Lumbar radiculopathy    Obesity    Peripheral neuropathy    Pollen allergies 2007   perennial   Prostate cancer ([place]) 2006   Reflux esophagitis 1995   Sickle cell trait ([place]) 2006   Sleep apnea, obstructive 2000   uses a cpap   Venous insufficiency 2005   Vitamin D deficiency 2012    Tobacco History: Social History   Tobacco Use  Smoking Status Never  Smokeless Tobacco Never   Counseling given: Not Answered   Outpatient Medications Prior to Visit  Medication Sig Dispense Refill   acetaminophen (TYLENOL) 500 MG tablet Take 500 mg by mouth every 6 (six) hours as needed for mild pain.     Blood Glucose Monitoring Suppl (ONE TOUCH ULTRA SYSTEM KIT) W/DEVICE KIT 1 kit by Does not apply route once. One touch ultrasoft lancets     brimonidine-timolol (  COMBIGAN) 0.2-0.5 % ophthalmic solution Place 1 drop into both eyes every 12 (twelve) hours.     carvedilol (COREG) 25 MG tablet Take 1 tablet (25 mg total) by mouth 2 (two) times daily. (Patient taking differently: Take 25 mg by mouth daily.) 180 tablet 1   FARXIGA 10 MG TABS tablet Take 10 mg by mouth daily.     Febuxostat 80 MG TABS Take 80 mg by mouth daily.     hydrALAZINE (APRESOLINE) 100 MG tablet TAKE 1 TABLET BY MOUTH THREE TIMES A DAY 90 tablet 6   Insulin Lispro Prot & Lispro (HUMALOG MIX 75/25 ) Inject 60-100 Units into the skin daily. humalog mix 75/25  injection 60-100 units q am sliding scale. Take 60 units if sugar is <180, then take 100 units if sugar is over 400     KERENDIA 20 MG TABS Take 1 tablet by mouth every morning.     levothyroxine (SYNTHROID) 137 MCG tablet Take 137 mcg by mouth every morning.     Potassium Chloride ER 20 MEQ TBCR TAKE 2 TABLETS TWICE A DAY 360 tablet 2   simvastatin (ZOCOR) 20 MG tablet Take 20 mg by mouth every evening.     telmisartan (MICARDIS) 80 MG tablet TAKE 1 TABLET (80 MG TOTAL) BY MOUTH IN THE MORNING AND AT BEDTIME. 180 tablet 3   torsemide (DEMADEX) 20 MG tablet Take 3 tablets (60 mg total) by mouth 2 (two) times daily. Absolute last refill without office visit please call [phone] to schedule 90 tablet 0   TRULICITY 3 JH/4.1DE SOPN Inject 3.5 mg into the skin once a week.     No facility-administered medications prior to visit.     Review of Systems:   Constitutional: No weight loss or gain, night sweats, fevers, chills, fatigue, or lassitude. HEENT: No headaches, difficulty swallowing, tooth/dental problems, or sore throat. No sneezing, itching, ear ache +nasal congestion, post nasal drip (baseline) CV:  No chest pain, orthopnea, PND, swelling in lower extremities, anasarca, dizziness, palpitations, syncope Resp: No shortness of breath with exertion or at rest. No excess mucus or change in color of mucus. No productive or non-productive. No hemoptysis. No wheezing.  No chest wall deformity GI:  No heartburn, indigestion, abdominal pain, nausea, vomiting, diarrhea, change in bowel habits, loss of appetite, bloody stools.  GU: No dysuria, change in color of urine, urgency or frequency.  No flank pain, no hematuria  Skin: No rash, lesions, ulcerations MSK: +chronic knee pain. No joint swelling.  No decreased range of motion.  No back pain. Neuro: No dizziness or lightheadedness.  Psych: No depression or anxiety. Mood stable.     Physical Exam:  BP (!) 162/80 (BP Location: Right Arm)    Pulse 72   Ht _0  (1.88 m)   Wt (!) 312 lb (141.5 kg)   SpO2 99%   BMI 40.06 kg/m   GEN: Pleasant, interactive, well-appearing; morbidly obese; in no acute distress. HEENT:  Normocephalic and atraumatic. PERRLA. Sclera white. Nasal turbinates pink, moist and patent bilaterally. No rhinorrhea present. Oropharynx pink and moist, without exudate or edema. No lesions, ulcerations, or postnasal drip. Mallampati III NECK:  Supple w/ fair ROM. No JVD present. Normal carotid impulses w/o bruits. Thyroid symmetrical with no goiter or nodules palpated. No lymphadenopathy.   CV: RRR, no m/r/g, no peripheral edema. Pulses intact, +2 bilaterally. No cyanosis, pallor or clubbing. PULMONARY:  Unlabored, regular breathing. Clear bilaterally A&P w/o wheezes/rales/rhonchi. No accessory muscle use. No dullness to  percussion. GI: BS present and normoactive. Soft, non-tender to palpation. No organomegaly or masses detected.  MSK: No erythema, warmth or tenderness. No deformities or joint swelling noted.  Neuro: A/Ox3. No focal deficits noted.   Skin: Warm, no lesions or rashe Psych: Normal affect and behavior. Judgement and thought content appropriate.     Lab Results:  CBC    Component Value Date/Time   WBC 13.4 (H) 04/21/2022 0935   RBC 4.29 04/21/2022 0935   HGB 11.4 (L) 04/21/2022 0935   HCT 34.6 (L) 04/21/2022 0935   PLT 433 (H) 04/21/2022 0935   MCV 80.7 04/21/2022 0935   MCH 26.6 04/21/2022 0935   MCHC 32.9 04/21/2022 0935   RDW 13.6 04/21/2022 0935   LYMPHSABS 0.9 04/21/2022 0935   MONOABS 1.2 (H) 04/21/2022 0935   EOSABS 0.5 04/21/2022 0935   BASOSABS 0.1 04/21/2022 0935    BMET    Component Value Date/Time   NA 133 (L) 04/21/2022 0935   NA 138 01/24/2022 1107   K 4.2 04/21/2022 0935   CL 94 (L) 04/21/2022 0935   CO2 27 04/21/2022 0935   GLUCOSE 302 (H) 04/21/2022 0935   BUN 53 (H) 04/21/2022 0935   BUN 39 (H) 01/24/2022 1107   CREATININE 2.90 (H) 04/21/2022 0935   CALCIUM  9.6 04/21/2022 0935   GFRNONAA 23 (L) 04/21/2022 0935   GFRAA 29 (L) 08/25/2020 1144    BNP    Component Value Date/Time   BNP 244.4 (H) 09/16/2020 0939     Imaging:  No results found.        No data to display          No results found for: "NITRICOXIDE"      Assessment & Plan:   Obstructive sleep apnea Severe OSA with AHI 64/h from NPSG 2000. He has excellent compliance and control. He occasionally feels like pressure is high. We will tighten his settings to 8-15 from 5-20 cmH2O based on his requirements. He is due for new machine - orders placed today.   Patient Instructions  Continue CPAP every night, minimum of 4-6 hours a night.  Change equipment every 30 days or as directed by DME. Wash your tubing with warm soap and water daily, hang to dry. Wash humidifier portion weekly.  Be aware of reduced alertness and do not drive or operate heavy machinery if experiencing this or drowsiness.  Exercise encouraged, as tolerated. Avoid or decrease alcohol consumption and medications that make you more sleepy, if possible. Notify if persistent daytime sleepiness occurs even with consistent use of CPAP.  Orders sent for new CPAP machine; we will adjust your settings to 8-15 cmH2O.   Follow up with your PCP or heart doctor about your blood pressure; ensure you are staying <140/90  Follow up in 8-12 weeks with Katie Aemilia Dedrick,NP to see how things are going on your new CPAP. If you have not received your new machine or have not been on it for at least 31 days, please call to reschedule your appointment.    Hypertension Hypertensive in office. He reports that he took his medications this morning. Advised him to follow up with his PCP or cardiologist. Goal <140/90  Morbid obesity ([place]) Healthy weight loss encouraged.    I spent 45 minutes of dedicated to the care of this patient on the date of this encounter to include pre-visit review of records, face-to-face time with the  patient discussing conditions above, post visit ordering of testing, clinical documentation with  the electronic health record, making appropriate referrals as documented, and communicating necessary findings to members of the patients care team.  Clayton Bibles, NP 07/31/2022  Pt aware and understands NP's role.

## 2022-07-31 NOTE — Patient Instructions (Addendum)
Continue CPAP every night, minimum of 4-6 hours a night.  Change equipment every 30 days or as directed by DME. Wash your tubing with warm soap and water daily, hang to dry. Wash humidifier portion weekly.  Be aware of reduced alertness and do not drive or operate heavy machinery if experiencing this or drowsiness.  Exercise encouraged, as tolerated. Avoid or decrease alcohol consumption and medications that make you more sleepy, if possible. Notify if persistent daytime sleepiness occurs even with consistent use of CPAP.  Orders sent for new CPAP machine; we will adjust your settings to 8-15 cmH2O.   Follow up with your PCP or heart doctor about your blood pressure; ensure you are staying <140/90  Follow up in 8-12 weeks with Katie Kiki Bivens,NP to see how things are going on your new CPAP. If you have not received your new machine or have not been on it for at least 31 days, please call to reschedule your appointment.

## 2022-07-31 NOTE — Assessment & Plan Note (Signed)
Healthy weight loss encouraged 

## 2022-08-01 ENCOUNTER — Telehealth: Payer: Self-pay

## 2022-08-01 MED ORDER — TORSEMIDE 40 MG PO TABS: 40.0000 mg | ORAL_TABLET | Freq: Every day | ORAL | 1 refills | Status: DC

## 2022-08-01 NOTE — Telephone Encounter (Signed)
Dr. Audie Box (DOD) advised of earlier phone note. Order for torsemide '40mg'$  daily (sent to patient's pharmacy) and to schedule with APP. Patient advised of medication and made an appointment with Arnold Long for 08/03/22.

## 2022-08-01 NOTE — Addendum Note (Signed)
Addended by: Betha Loa F on: 08/01/2022 02:20 PM   Modules accepted: Orders

## 2022-08-01 NOTE — Telephone Encounter (Addendum)
Patient stated he has swelling in legs and gets sob at rest and with exertion. He has not taken torsemide since May because his pharmacy didn't refill it. Upon review of chart, patient needed an appointment before torsemide would be refilled. Pateint also asked if he should be taking potassium because he is taking [place]. Please advise on edema and potassium.

## 2022-08-02 NOTE — Progress Notes (Unsigned)
Cardiology Clinic Note   Patient Name: Brendan [name] Date of Encounter: 08/03/2022  Primary Care Provider:  Seward Carol, MD Primary Cardiologist: Dr. Oval Linsey   Patient Profile    66 year old male with history of difficult to control hypertension, HL,chronic diastolic CHF, OSA on CPAP, Type II diabetes, renal insufficiency, morbid obesity, hyperaldosteronism, hypothyroidism, with multiple medical problems as listed in PMH to include chronic pain of ankles and knees. Was last seen in the office by Brendan Memos, FNP and BP was well controlled at 122/74. He was not restarted on spironolactone due to CKD.   Past Medical History    Past Medical History:  Diagnosis Date   Acanthosis nigricans    Atopic dermatitis    Diabetes ([place]) 2004   Erectile dysfunction    Fatty liver 2007   Glaucoma 2008   Gynecomastia 2009   Hyperaldosteronism ([place]) 2000   Hypercholesterolemia    Hyperlipidemia    Hypertension    Hypoglycemic reaction    Hypothyroidism 2004   Incontinence    Intermittent vertigo 2012   Left cervical radiculopathy 2012   Lumbar radiculopathy    Obesity    Peripheral neuropathy    Pollen allergies 2007   perennial   Prostate cancer ([place]) 2006   Reflux esophagitis 1995   Sickle cell trait ([place]) 2006   Sleep apnea, obstructive 2000   uses a cpap   Venous insufficiency 2005   Vitamin D deficiency 2012   Past Surgical History:  Procedure Laterality Date   COLONOSCOPY  2006   normal   lap band surgery  2009   left inguinal hernia repair  1998   robotic prostatectomy  2008    Allergies  Allergies  Allergen Reactions   Ace Inhibitors     REACTION: cough   Maxidex [Dexamethasone] Other (See Comments)    Sexual dysfunction   Verapamil     REACTION: constipation    History of Present Illness    Brendan [name] presents today for ongoing assessment and management of resistant hypertension on 4 medications in the setting of hyperaldosteronism. hypothyroidism,OSA,  with obesity. He called our office on 08/01/2022 for complaints of edema. He has been out of torsemide since May 2023, as he needed an appointment for refills.   He will need BMET and review of his medications. Most recent labs on 04/21/2022 reported Creatinine of 2.90, Glucose of 302, BUN 53, GFR 23. He is on [place] for CKD which can cause hypokalemia.  He will need to be back on potassium if he is found to have hypokalemia.   Brendan [name] states that he has gained about 15 pounds over the last 3 to 4 months.  His last dose of torsemide was in April and he was told to stop it due to his kidney function.  The patient had worsening edema over the last month, especially lower extremities with some mild abdominal fullness.  He denies any early satiety.  He called our office and was given a prescription for 40 mg of torsemide which he took 2 tablets yesterday and has had the beginnings of significant diuresis, dropping approximately 4 pounds overnight.  He continues to have some dyspnea on exertion but denies any PND or orthopnea.  He is followed by Dr. Buddy Duty endocrinologist for hyperaldosteronism and hypothyroid, and is also followed by nephrology for chronic kidney disease.  He has been medically compliant with other medications which are prescribed to him.  Home Medications    Current Outpatient Medications  Medication Sig Dispense Refill   acetaminophen (TYLENOL) 500 MG tablet Take 500 mg by mouth every 6 (six) hours as needed for mild pain.     Blood Glucose Monitoring Suppl (ONE TOUCH ULTRA SYSTEM KIT) W/DEVICE KIT 1 kit by Does not apply route once. One touch ultrasoft lancets     brimonidine-timolol (COMBIGAN) 0.2-0.5 % ophthalmic solution Place 1 drop into both eyes every 12 (twelve) hours.     carvedilol (COREG) 25 MG tablet Take 1 tablet (25 mg total) by mouth 2 (two) times daily. (Patient taking differently: Take 25 mg by mouth daily.) 180 tablet 1   FARXIGA 10 MG TABS tablet Take 10 mg by mouth  daily.     Febuxostat 80 MG TABS Take 80 mg by mouth daily.     hydrALAZINE (APRESOLINE) 100 MG tablet TAKE 1 TABLET BY MOUTH THREE TIMES A DAY 90 tablet 6   Insulin Lispro Prot & Lispro (HUMALOG MIX 75/25 KWIKPEN) (75-25) 100 UNIT/ML Kwikpen 60 UNITS AT BEGINNING OF BREAKFAST & 50 UNITS AT BEGINNING OF EVENING MEAL SUBCUTANEOUS TWICE A DAY Subcutaneous for 30     KERENDIA 20 MG TABS Take 1 tablet by mouth every morning.     levothyroxine (SYNTHROID) 137 MCG tablet Take 137 mcg by mouth every morning.     Potassium Chloride ER 20 MEQ TBCR TAKE 2 TABLETS TWICE A DAY 360 tablet 2   simvastatin (ZOCOR) 20 MG tablet Take 20 mg by mouth every evening.     telmisartan (MICARDIS) 80 MG tablet TAKE 1 TABLET (80 MG TOTAL) BY MOUTH IN THE MORNING AND AT BEDTIME. 180 tablet 3   torsemide (DEMADEX) 20 MG tablet Take 20 mg by mouth daily. Take 1 Tablet Daily     torsemide 40 MG TABS Take 40 mg by mouth daily. 30 tablet 1   TRULICITY 4.5 DJ/5.7SV SOPN SMARTSIG:4.5 Milligram(s) SUB-Q Every 4 Weeks     No current facility-administered medications for this visit.     Family History    Family History  Problem Relation Age of Onset   Hypertension Father        deceased age 49   Heart attack Father    Heart failure Mother    COPD Sister    CVA Sister    Diabetes Daughter    Colon cancer Neg Hx    Stomach cancer Neg Hx    He indicated that his mother is alive. He indicated that his father is deceased. He indicated that the status of his daughter is unknown. He indicated that the status of his neg hx is unknown.  Social History    Social History   Socioeconomic History   Marital status: Married    Spouse name: Not on file   Number of children: 3   Years of education: Not on file   Highest education level: Not on file  Occupational History   Occupation: truck driver    Comment: Retired Economist  Tobacco Use   Smoking status: Never   Smokeless tobacco: Never  Vaping Use   Vaping Use:  Never used  Substance and Sexual Activity   Alcohol use: Yes    Comment: one drink every few months   Drug use: No   Sexual activity: Not on file  Other Topics Concern   Not on file  Social History Narrative   Right handed   Lives in a two story home    Drinks no caffeine    Social Determinants of Health  Financial Resource Strain: Not on file  Food Insecurity: Not on file  Transportation Needs: Not on file  Physical Activity: Not on file  Stress: Not on file  Social Connections: Not on file  Intimate Partner Violence: Not on file     Review of Systems    General:  No chills, fever, night sweats or positive for weight gain.  Cardiovascular:  No chest pain, dyspnea on exertion, positive for lower extremity edema, orthopnea, palpitations, paroxysmal nocturnal dyspnea. Dermatological: No rash, lesions/masses Respiratory: No cough, positive for dyspnea Urologic: No hematuria, dysuria Abdominal:   No nausea, vomiting, diarrhea, bright red blood per rectum, melena, or hematemesis Neurologic:  No visual changes, wkns, changes in mental status. All other systems reviewed and are otherwise negative except as noted above.     Physical Exam    VS:  BP (!) 144/82   Pulse 71   Ht $R'6\' 2"'cw$  (1.88 m)   Wt (!) 301 lb 9.6 oz (136.8 kg)   SpO2 97%   BMI 38.72 kg/m  , BMI Body mass index is 38.72 kg/m.     GEN: Well nourished, well developed, in no acute distress.  Morbidly obese HEENT: normal. Neck: Supple, unable to evaluate for JVD due to large neck circumference HJR was positive, carotid bruits, or masses. Cardiac: RRR, S3 rubs, or gallops. No clubbing, cyanosis, 2+ pretibial, 3+ ankle edema.  Radials/DP/PT 2+ and equal bilaterally.  Respiratory:  Respirations regular and unlabored, clear to auscultation bilaterally. GI: Soft, nontender, nondistended, BS + x 4. MS: no deformity or atrophy. Skin: warm and dry, no rash. Neuro:  Strength and sensation are intact. Psych: Normal  affect.  Accessory Clinical Findings    ECG personally reviewed by me today-not completed today  Lab Results  Component Value Date   WBC 13.4 (H) 04/21/2022   HGB 11.4 (L) 04/21/2022   HCT 34.6 (L) 04/21/2022   MCV 80.7 04/21/2022   PLT 433 (H) 04/21/2022   Lab Results  Component Value Date   CREATININE 2.90 (H) 04/21/2022   BUN 53 (H) 04/21/2022   NA 133 (L) 04/21/2022   K 4.2 04/21/2022   CL 94 (L) 04/21/2022   CO2 27 04/21/2022   Lab Results  Component Value Date   ALT 13 04/21/2022   AST 14 (L) 04/21/2022   ALKPHOS 74 04/21/2022   BILITOT 0.8 04/21/2022   No results found for: "CHOL", "HDL", "LDLCALC", "LDLDIRECT", "TRIG", "CHOLHDL"  Lab Results  Component Value Date   HGBA1C 8.4 (H) 03/26/2020    Review of Prior Studies: Echocardiogram 03/26/2020 1. Left ventricular ejection fraction, by estimation, is 55 to 60%. The  left ventricle has normal function. Left ventricular endocardial border  not optimally defined to evaluate regional wall motion. There is moderate  left ventricular hypertrophy. Left  ventricular diastolic parameters are consistent with Grade II diastolic  dysfunction (pseudonormalization). Elevated left ventricular end-diastolic  pressure.   2. Right ventricular systolic function is normal. The right ventricular  size is normal. Tricuspid regurgitation signal is inadequate for assessing  PA pressure.   3. The mitral valve is abnormal. Trivial mitral valve regurgitation.   4. The aortic valve was not well visualized. Aortic valve regurgitation  is not visualized.   5. The inferior vena cava is dilated in size with <50% respiratory  variability, suggesting right atrial pressure of 15 mmHg.   Renal Artery Ultrasound 07/27/2020 Summary:  Largest Aortic Diameter: 3.0 cm     Renal:     Right:  Normal size right kidney. Abnormal right Resistive Index.         Normal cortical thickness of right kidney. No evidence of         right renal artery  stenosis. RRV flow present.  Left:  Normal size of left kidney. Abnormal left Resisitve Index.         Normal cortical thickness of the left kidney. No evidence of         left renal artery stenosis. LRV flow present.  Mesenteric:  Normal Celiac artery findings. 70 to 99% stenosis in the superior  mesenteric  artery.   Assessment & Plan   1.  Acute on chronic diastolic CHF: Has gained approximately 15 pounds over the last 3 months after stopping torsemide, with significant lower extremity edema.  He was given a prescription for torsemide 40 mg which she took 2 tablets yesterday.  He has had approximately 4 to 5 pound diuresis.  I will give him an additional 40 mg dose today and then have him on maintenance dose of 20 mg daily thereafter. BMET will be ordered in 1 week to evaluate kidney function and potassium status especially on Kerend[place].  He is to continue carvedilol 25 mg twice daily Farxiga 10 mg daily.  He is not a candidate for spironolactone due to chronic kidney disease.  I will see him on follow-up in 1 month.  We will also order an echocardiogram for ongoing surveillance of LV function.  2.  Hyperlipidemia: No current labs available, they are followed by PCP.  With diabetes, would like to see LDL less than 100.  He is on simvastatin 20 mg daily.  We will request labs from PCP  3.  Hypertension: Patient has hyperaldosteronism, as well as resistant hypertension.  He is currently controlled on his medication regimen and has had improvement in blood pressure with reinstatement of his torsemide.  Follow-up labs in 1 week.  4.  OSA on CPAP: States he is compliant.  Should be followed by sleep medicine.  5.  Type 2 diabetes: Followed by endocrinologist, Dr. Buddy Duty.  Most recent glucose was elevated over 300 on labs.  Management of insulin dosage through endocrinology.  Current medicines are reviewed at length with the patient today.  I have spent 45 min's  dedicated to the care of this patient  on the date of this encounter to include pre-visit review of records, assessment, management and diagnostic testing,with shared decision making.  Signed, Phill Myron. West Pugh, ANP, AACC   08/03/2022 11:23 AM      Office [phone] Fax [phone]  Notice: This dictation was prepared with Dragon dictation along with smaller phrase technology. Any transcriptional errors that result from this process are unintentional and may not be corrected upon review.

## 2022-08-03 ENCOUNTER — Ambulatory Visit: Payer: BC Managed Care – PPO | Attending: Adult Health | Admitting: Adult Health

## 2022-08-03 ENCOUNTER — Encounter: Payer: Self-pay | Admitting: Adult Health

## 2022-08-03 VITALS — BP 144/82 | HR 71 | Ht 74.0 in | Wt 301.6 lb

## 2022-08-03 DIAGNOSIS — I10 Essential (primary) hypertension: Secondary | ICD-10-CM

## 2022-08-03 DIAGNOSIS — E7800 Pure hypercholesterolemia, unspecified: Secondary | ICD-10-CM | POA: Diagnosis not present

## 2022-08-03 DIAGNOSIS — I5033 Acute on chronic diastolic (congestive) heart failure: Secondary | ICD-10-CM | POA: Diagnosis not present

## 2022-08-03 NOTE — Patient Instructions (Signed)
Medication Instructions:  Torsemide : For Today 9/15 Take 2 Tablets 40 mg . Starting 9/16 Take 1 Daily 20 mg. *If you need a refill on your cardiac medications before your next appointment, please call your pharmacy*   Lab Work: BMET To Be Done in 1 Week. If you have labs (blood work) drawn today and your tests are completely normal, you will receive your results only by: [place] (if you have MyChart) OR A paper copy in the mail If you have any lab test that is abnormal or we need to change your treatment, we will call you to review the results.   Testing/Procedures: [address], [address]. Your physician has requested that you have an echocardiogram. Echocardiography is a painless test that uses sound waves to create images of your heart. It provides your doctor with information about the size and shape of your heart and how well your heart's chambers and valves are working. This procedure takes approximately one hour. There are no restrictions for this procedure.    Follow-Up: At [facility], you and your health needs are our priority.  As part of our continuing mission to provide you with exceptional heart care, we have created designated Provider Care Teams.  These Care Teams include your primary Cardiologist (physician) and Advanced Practice Providers (APPs -  Physician Assistants and Nurse Practitioners) who all work together to provide you with the care you need, when you need it.  We recommend signing up for the patient portal called "MyChart".  Sign up information is provided on this After Visit Summary.  MyChart is used to connect with patients for Virtual Visits (Telemedicine).  Patients are able to view lab/test results, encounter notes, upcoming appointments, etc.  Non-urgent messages can be sent to your provider as well.   To learn more about what you can do with MyChart, go to [email or web address].    Your next appointment:   1 month(s)  The  format for your next appointment:   In Person  Provider:   Jory Sims, DNP, ANP

## 2022-08-09 DIAGNOSIS — I10 Essential (primary) hypertension: Secondary | ICD-10-CM | POA: Diagnosis not present

## 2022-08-10 ENCOUNTER — Telehealth: Payer: Self-pay

## 2022-08-10 DIAGNOSIS — Z79899 Other long term (current) drug therapy: Secondary | ICD-10-CM

## 2022-08-10 LAB — BASIC METABOLIC PANEL
BUN/Creatinine Ratio: 10 (ref 10–24)
BUN: 21 mg/dL (ref 8–27)
CO2: 26 mmol/L (ref 20–29)
Calcium: 9.2 mg/dL (ref 8.6–10.2)
Chloride: 98 mmol/L (ref 96–106)
Creatinine, Ser: 2.14 mg/dL — ABNORMAL HIGH (ref 0.76–1.27)
Glucose: 351 mg/dL — ABNORMAL HIGH (ref 70–99)
Potassium: 3.5 mmol/L (ref 3.5–5.2)
Sodium: 140 mmol/L (ref 134–144)
eGFR: 33 mL/min/{1.73_m2} — ABNORMAL LOW (ref >59)

## 2022-08-10 NOTE — Telephone Encounter (Addendum)
Called patient regarding results. Patient had understanding of results. Patient advised to repeat BMET in 1 month prior to follow up visit with Arnold Long.----- Message from Lendon Colonel, NP sent at 08/10/2022  7:20 AM EDT ----- Improvement in renal function with diureses.  Continue the torsemide as directed. Please add potassium 20 mEq daily to his regimen, to keep potassium at 4.0. Repeat BMET one month please, just before his appt on Oct 20th.    KL

## 2022-08-16 ENCOUNTER — Ambulatory Visit (HOSPITAL_COMMUNITY): Payer: BC Managed Care – PPO | Attending: Adult Health

## 2022-08-16 DIAGNOSIS — I10 Essential (primary) hypertension: Secondary | ICD-10-CM

## 2022-08-16 LAB — ECHOCARDIOGRAM COMPLETE
Area-P 1/2: 3.51 cm2
S' Lateral: 4.3 cm

## 2022-08-16 MED ORDER — PERFLUTREN LIPID MICROSPHERE
1.0000 mL | INTRAVENOUS | Status: AC | PRN
  Administered 2022-08-16: 2 mL via INTRAVENOUS

## 2022-08-20 DIAGNOSIS — M1A09X0 Idiopathic chronic gout, multiple sites, without tophus (tophi): Secondary | ICD-10-CM | POA: Diagnosis not present

## 2022-08-20 DIAGNOSIS — Z794 Long term (current) use of insulin: Secondary | ICD-10-CM | POA: Diagnosis not present

## 2022-08-20 DIAGNOSIS — N1832 Chronic kidney disease, stage 3b: Secondary | ICD-10-CM | POA: Diagnosis not present

## 2022-08-20 DIAGNOSIS — E119 Type 2 diabetes mellitus without complications: Secondary | ICD-10-CM | POA: Diagnosis not present

## 2022-08-23 ENCOUNTER — Other Ambulatory Visit: Payer: Self-pay | Admitting: Cardiovascular Disease

## 2022-09-03 DIAGNOSIS — Z79899 Other long term (current) drug therapy: Secondary | ICD-10-CM | POA: Diagnosis not present

## 2022-09-04 LAB — BASIC METABOLIC PANEL
BUN/Creatinine Ratio: 12 (ref 10–24)
BUN: 26 mg/dL (ref 8–27)
CO2: 26 mmol/L (ref 20–29)
Calcium: 8.9 mg/dL (ref 8.6–10.2)
Chloride: 99 mmol/L (ref 96–106)
Creatinine, Ser: 2.17 mg/dL — ABNORMAL HIGH (ref 0.76–1.27)
Glucose: 322 mg/dL — ABNORMAL HIGH (ref 70–99)
Potassium: 3.7 mmol/L (ref 3.5–5.2)
Sodium: 140 mmol/L (ref 134–144)
eGFR: 33 mL/min/{1.73_m2} — ABNORMAL LOW (ref >59)

## 2022-09-05 ENCOUNTER — Telehealth: Payer: Self-pay

## 2022-09-05 NOTE — Telephone Encounter (Addendum)
Called patient regarding results. Patient had understanding of results.----- Message from Lendon Colonel, NP sent at 09/05/2022 10:56 AM EDT ----- Severely elevated blood glucose. Should see PCP or endocrinologist for better management   KL

## 2022-09-06 NOTE — Progress Notes (Signed)
Cardiology Clinic Note   Patient Name: Brendan [name] Date of Encounter: 09/07/2022  Primary Care Provider:  Seward Carol, MD Primary Cardiologist:  Oval Linsey  Advanced Heart Failure: [place]   Patient Profile    66 year old male with history of difficult to control hypertension, HL,chronic diastolic CHF, OSA on CPAP, Type II diabetes, renal insufficiency, morbid obesity, mesenteric artery disease (asymptomatic), hyperaldosteronism (followed by Dr. Buddy Duty, endocrinologist), hypothyroidism, with multiple medical problems as listed in PMH to include chronic pain of ankles and knees. Last seen in the office on 08/03/2022 due to complaints of edema and 15 lb weight gain.   He had been out of torsemide for 4.5 months. Most recent creatinine 2.90. Prior to the office visit he was given torsemide Rx and had 4 lbs weight loss but continued to have mild edema and DOE.  He was continued on torsemide 20 mg daily, and was not found to be candidate for spironolactone due to CKD. Repeat echocardiogram was ordered. EF was found to be normal at 60-65%, with mild concentric LVH, grade 1 diastolic CHF.No significant valvular disease.   Past Medical History    Past Medical History:  Diagnosis Date   Acanthosis nigricans    Atopic dermatitis    Diabetes ([place]) 2004   Erectile dysfunction    Fatty liver 2007   Glaucoma 2008   Gynecomastia 2009   Hyperaldosteronism ([place]) 2000   Hypercholesterolemia    Hyperlipidemia    Hypertension    Hypoglycemic reaction    Hypothyroidism 2004   Incontinence    Intermittent vertigo 2012   Left cervical radiculopathy 2012   Lumbar radiculopathy    Obesity    Peripheral neuropathy    Pollen allergies 2007   perennial   Prostate cancer ([place]) 2006   Reflux esophagitis 1995   Sickle cell trait ([place]) 2006   Sleep apnea, obstructive 2000   uses a cpap   Venous insufficiency 2005   Vitamin D deficiency 2012   Past Surgical History:  Procedure Laterality Date    COLONOSCOPY  2006   normal   lap band surgery  2009   left inguinal hernia repair  1998   robotic prostatectomy  2008    Allergies  Allergies  Allergen Reactions   Ace Inhibitors     REACTION: cough   Maxidex [Dexamethasone] Other (See Comments)    Sexual dysfunction   Verapamil     REACTION: constipation    History of Present Illness    Mr Bednarczyk returns today for ongoing assessment of hypertension and chronic diastolic CHF, with other history as outlined above. He was restarted on torsemide due to edema and weight gain with hypertension, after running out for 4 months. BMET revealed Creatinine of 2.17 stable compared to prior level one month earlier at 2.4. He was found to have hyperglycemia at 322. Potassium of 3.7.   He comes today having lost a total of 12 pounds since restarting torsemide.  He is more active, is working in his yard doing weed eating and walking.  He has been taking a supplement over-the-counter "Minerva Fester" which is a powder that he adds to water which is helped with inflammation symptoms of his knees allowing him to be more active.  He denies any worsening shortness of breath, coughing, or edema.  He has been medically compliant.  Home Medications    Current Outpatient Medications  Medication Sig Dispense Refill   acetaminophen (TYLENOL) 500 MG tablet Take 500 mg by mouth every 6 (six)  hours as needed for mild pain.     Blood Glucose Monitoring Suppl (ONE TOUCH ULTRA SYSTEM KIT) W/DEVICE KIT 1 kit by Does not apply route once. One touch ultrasoft lancets     brimonidine-timolol (COMBIGAN) 0.2-0.5 % ophthalmic solution Place 1 drop into both eyes every 12 (twelve) hours.     carvedilol (COREG) 25 MG tablet Take 1 tablet (25 mg total) by mouth 2 (two) times daily. (Patient taking differently: Take 25 mg by mouth daily.) 180 tablet 1   FARXIGA 10 MG TABS tablet Take 10 mg by mouth daily.     Febuxostat 80 MG TABS Take 80 mg by mouth daily.     hydrALAZINE  (APRESOLINE) 100 MG tablet TAKE 1 TABLET BY MOUTH THREE TIMES A DAY 90 tablet 6   Insulin Glargine (BASAGLAR KWIKPEN) 100 UNIT/ML Inject into the skin.     KERENDIA 20 MG TABS Take 1 tablet by mouth every morning.     levothyroxine (SYNTHROID) 137 MCG tablet Take 137 mcg by mouth every morning.     Potassium Chloride ER 20 MEQ TBCR TAKE 2 TABLETS TWICE A DAY 360 tablet 2   simvastatin (ZOCOR) 20 MG tablet Take 20 mg by mouth every evening.     telmisartan (MICARDIS) 80 MG tablet TAKE 1 TABLET (80 MG TOTAL) BY MOUTH IN THE MORNING AND AT BEDTIME. 180 tablet 3   torsemide (DEMADEX) 20 MG tablet Take 20 mg by mouth daily. Take 1 Tablet Daily     TRULICITY 4.5 XT/0.6YI SOPN SMARTSIG:4.5 Milligram(s) SUB-Q Every 4 Weeks     Insulin Lispro Prot & Lispro (HUMALOG MIX 75/25 KWIKPEN) (75-25) 100 UNIT/ML Kwikpen 60 UNITS AT BEGINNING OF BREAKFAST & 50 UNITS AT BEGINNING OF EVENING MEAL SUBCUTANEOUS TWICE A DAY Subcutaneous for 30 (Patient not taking: Reported on 09/07/2022)     torsemide 40 MG TABS Take 40 mg by mouth daily. (Patient not taking: Reported on 09/07/2022) 30 tablet 1   No current facility-administered medications for this visit.     Family History    Family History  Problem Relation Age of Onset   Hypertension Father        deceased age 29   Heart attack Father    Heart failure Mother    COPD Sister    CVA Sister    Diabetes Daughter    Colon cancer Neg Hx    Stomach cancer Neg Hx    He indicated that his mother is alive. He indicated that his father is deceased. He indicated that the status of his daughter is unknown. He indicated that the status of his neg hx is unknown.  Social History    Social History   Socioeconomic History   Marital status: Married    Spouse name: Not on file   Number of children: 3   Years of education: Not on file   Highest education level: Not on file  Occupational History   Occupation: truck driver    Comment: Retired Economist   Tobacco Use   Smoking status: Never   Smokeless tobacco: Never  Vaping Use   Vaping Use: Never used  Substance and Sexual Activity   Alcohol use: Yes    Comment: one drink every few months   Drug use: No   Sexual activity: Not on file  Other Topics Concern   Not on file  Social History Narrative   Right handed   Lives in a two story home    Drinks no caffeine  Social Determinants of Health   Financial Resource Strain: Not on file  Food Insecurity: Not on file  Transportation Needs: Not on file  Physical Activity: Not on file  Stress: Not on file  Social Connections: Not on file  Intimate Partner Violence: Not on file     Review of Systems    General:  No chills, fever, night sweats or weight changes.  Cardiovascular:  No chest pain, dyspnea on exertion, edema, orthopnea, palpitations, paroxysmal nocturnal dyspnea. Dermatological: No rash, lesions/masses Respiratory: No cough, dyspnea Urologic: No hematuria, dysuria Abdominal:   No nausea, vomiting, diarrhea, bright red blood per rectum, melena, or hematemesis Neurologic:  No visual changes, wkns, changes in mental status. All other systems reviewed and are otherwise negative except as noted above.     Physical Exam    VS:  BP 136/78 (BP Location: Left Arm, Patient Position: Sitting, Cuff Size: Large)   Pulse 68   Ht 6' 2" (1.88 m)   Wt 296 lb 6.4 oz (134.4 kg)   SpO2 98%   BMI 38.06 kg/m  , BMI Body mass index is 38.06 kg/m.     GEN: Well nourished, well developed, in no acute distress. HEENT: normal. Neck: Supple, no JVD, carotid bruits, or masses. Cardiac: RRR, no murmurs, rubs, or gallops. No clubbing, cyanosis, 1+ edema above the sock line..  Radials/DP/PT 2+ and equal bilaterally.  Respiratory:  Respirations regular and unlabored, clear to auscultation bilaterally. GI: Soft, nontender, nondistended, BS + x 4. MS: no deformity or atrophy. Skin: warm and dry, no rash. Neuro:  Strength and sensation  are intact. Psych: Normal affect.  Accessory Clinical Findings    ECG personally reviewed by me today-not completed today  Lab Results  Component Value Date   WBC 13.4 (H) 04/21/2022   HGB 11.4 (L) 04/21/2022   HCT 34.6 (L) 04/21/2022   MCV 80.7 04/21/2022   PLT 433 (H) 04/21/2022   Lab Results  Component Value Date   CREATININE 2.17 (H) 09/03/2022   BUN 26 09/03/2022   NA 140 09/03/2022   K 3.7 09/03/2022   CL 99 09/03/2022   CO2 26 09/03/2022   Lab Results  Component Value Date   ALT 13 04/21/2022   AST 14 (L) 04/21/2022   ALKPHOS 74 04/21/2022   BILITOT 0.8 04/21/2022   No results found for: "CHOL", "HDL", "LDLCALC", "LDLDIRECT", "TRIG", "CHOLHDL"  Lab Results  Component Value Date   HGBA1C 8.4 (H) 03/26/2020    Review of Prior Studies: Echocardiogram 08/16/2022  1. Left ventricular ejection fraction, by estimation, is 60 to 65%. The  left ventricle has normal function. The left ventricle has no regional  wall motion abnormalities. The left ventricular internal cavity size was  moderately dilated. There is mild  concentric left ventricular hypertrophy. Left ventricular diastolic  parameters are consistent with Grade I diastolic dysfunction (impaired  relaxation).   2. Right ventricular systolic function is normal. The right ventricular  size is normal.   3. The mitral valve is normal in structure. Trivial mitral valve  regurgitation. No evidence of mitral stenosis.   4. The aortic valve is tricuspid. Aortic valve regurgitation is not  visualized. No aortic stenosis is present.   5. The inferior vena cava is normal in size with greater than 50%  respiratory variability, suggesting right atrial pressure of 3 mmHg.   Renal Artery [place] 07/27/2020 Largest Aortic Diameter: 3.0 cm     Renal:     Right: Normal size right kidney.  Abnormal right Resistive Index.         Normal cortical thickness of right kidney. No evidence of         right renal artery stenosis. RRV  flow present.  Left:  Normal size of left kidney. Abnormal left Resisitve Index.         Normal cortical thickness of the left kidney. No evidence of         left renal artery stenosis. LRV flow present.  Mesenteric:  Normal Celiac artery findings. 70 to 99% stenosis in the superior  mesenteric  artery.  Limited view due to body habitus and overlying bowel.     *See table(s) above for measurements and observations.      Assessment & Plan   1.  Chronic diastolic CHF: Once back taking torsemide regularly, he is not showing overt signs of volume overload.  He has lost 12 pounds.  He is breathing better and becoming more active.  He is doing his best to avoid salt.  We will continue his current medication regimen.  Blood pressure is much better controlled.  He will continue Micardis 80 mg daily, torsemide 20 mg daily, carvedilol 25 mg twice daily.  Only mild increase in creatinine from prior to starting torsemide.  From 2.14-2.17.  2.  Hypertension: Much better controlled with diuresis.  Although not optimal for diabetic patient, significant decrease from 167/88 to current blood pressure of 136/78.  He is doing his best to watch his salt and become more active.  I have encouraged him due to the response to diuresis and his increase in activity to continue with his lifestyle changes and medical adherence.  Continue antihypertensives.   3.  Type 2 diabetes: Followed by his endocrinologist Dr. Buddy Duty.  I noticed on recent lab work that his blood glucose was significantly elevated greater than 350.  I have advised him to follow-up for medication management.    4.  Hypercholesterolemia: Currently on Zocor 20 mg daily.  Followed by endocrinology.  Goal of LDL less than 70 in diabetic patients.  Current medicines are reviewed at length with the patient today.  I have spent 25 min's  dedicated to the care of this patient on the date of this encounter to include pre-visit review of records, assessment,  management and diagnostic testing,with shared decision making.  Signed, Phill Myron. West Pugh, ANP, [place]   09/07/2022 12:24 PM      Office [phone] Fax [phone]  Notice: This dictation was prepared with Dragon dictation along with smaller phrase technology. Any transcriptional errors that result from this process are unintentional and may not be corrected upon review.

## 2022-09-07 ENCOUNTER — Ambulatory Visit: Payer: BC Managed Care – PPO | Attending: Adult Health | Admitting: Adult Health

## 2022-09-07 ENCOUNTER — Encounter: Payer: Self-pay | Admitting: Adult Health

## 2022-09-07 VITALS — BP 136/78 | HR 68 | Ht 74.0 in | Wt 296.4 lb

## 2022-09-07 DIAGNOSIS — E7800 Pure hypercholesterolemia, unspecified: Secondary | ICD-10-CM

## 2022-09-07 DIAGNOSIS — G4733 Obstructive sleep apnea (adult) (pediatric): Secondary | ICD-10-CM

## 2022-09-07 DIAGNOSIS — E118 Type 2 diabetes mellitus with unspecified complications: Secondary | ICD-10-CM

## 2022-09-07 DIAGNOSIS — I5032 Chronic diastolic (congestive) heart failure: Secondary | ICD-10-CM

## 2022-09-07 DIAGNOSIS — I10 Essential (primary) hypertension: Secondary | ICD-10-CM | POA: Diagnosis not present

## 2022-09-07 NOTE — Patient Instructions (Signed)
Medication Instructions:  No Changes *If you need a refill on your cardiac medications before your next appointment, please call your pharmacy*   Lab Work: No Labs  If you have labs (blood work) drawn today and your tests are completely normal, you will receive your results only by: [place] (if you have MyChart) OR A paper copy in the mail If you have any lab test that is abnormal or we need to change your treatment, we will call you to review the results.   Testing/Procedures: No Testing   Follow-Up: At [facility], you and your health needs are our priority.  As part of our continuing mission to provide you with exceptional heart care, we have created designated Provider Care Teams.  These Care Teams include your primary Cardiologist (physician) and Advanced Practice Providers (APPs -  Physician Assistants and Nurse Practitioners) who all work together to provide you with the care you need, when you need it.  We recommend signing up for the patient portal called "MyChart".  Sign up information is provided on this After Visit Summary.  MyChart is used to connect with patients for Virtual Visits (Telemedicine).  Patients are able to view lab/test results, encounter notes, upcoming appointments, etc.  Non-urgent messages can be sent to your provider as well.   To learn more about what you can do with MyChart, go to [email or web address].    Your next appointment:   6 month(s)  The format for your next appointment:   In Person  Provider:   Skeet Latch, MD

## 2022-09-21 ENCOUNTER — Other Ambulatory Visit: Payer: Self-pay | Admitting: Cardiovascular Disease

## 2022-09-24 DIAGNOSIS — M1A09X0 Idiopathic chronic gout, multiple sites, without tophus (tophi): Secondary | ICD-10-CM | POA: Diagnosis not present

## 2022-09-24 DIAGNOSIS — N183 Chronic kidney disease, stage 3 unspecified: Secondary | ICD-10-CM | POA: Diagnosis not present

## 2022-09-24 DIAGNOSIS — D573 Sickle-cell trait: Secondary | ICD-10-CM | POA: Diagnosis not present

## 2022-09-24 DIAGNOSIS — N2581 Secondary hyperparathyroidism of renal origin: Secondary | ICD-10-CM | POA: Diagnosis not present

## 2022-09-28 DIAGNOSIS — I129 Hypertensive chronic kidney disease with stage 1 through stage 4 chronic kidney disease, or unspecified chronic kidney disease: Secondary | ICD-10-CM | POA: Diagnosis not present

## 2022-09-28 DIAGNOSIS — R809 Proteinuria, unspecified: Secondary | ICD-10-CM | POA: Diagnosis not present

## 2022-09-28 DIAGNOSIS — E039 Hypothyroidism, unspecified: Secondary | ICD-10-CM | POA: Diagnosis not present

## 2022-09-28 DIAGNOSIS — N1832 Chronic kidney disease, stage 3b: Secondary | ICD-10-CM | POA: Diagnosis not present

## 2022-09-28 DIAGNOSIS — E1122 Type 2 diabetes mellitus with diabetic chronic kidney disease: Secondary | ICD-10-CM | POA: Diagnosis not present

## 2022-09-28 DIAGNOSIS — N2581 Secondary hyperparathyroidism of renal origin: Secondary | ICD-10-CM | POA: Diagnosis not present

## 2022-10-15 DIAGNOSIS — R0683 Snoring: Secondary | ICD-10-CM | POA: Diagnosis not present

## 2022-10-15 DIAGNOSIS — G4733 Obstructive sleep apnea (adult) (pediatric): Secondary | ICD-10-CM | POA: Diagnosis not present

## 2022-10-15 DIAGNOSIS — I10 Essential (primary) hypertension: Secondary | ICD-10-CM | POA: Diagnosis not present

## 2022-10-25 ENCOUNTER — Ambulatory Visit: Payer: PRIVATE HEALTH INSURANCE | Admitting: Nurse Practitioner

## 2022-10-29 DIAGNOSIS — H401131 Primary open-angle glaucoma, bilateral, mild stage: Secondary | ICD-10-CM | POA: Diagnosis not present

## 2022-11-15 ENCOUNTER — Other Ambulatory Visit: Payer: Self-pay | Admitting: Cardiovascular Disease

## 2022-11-23 ENCOUNTER — Emergency Department (HOSPITAL_BASED_OUTPATIENT_CLINIC_OR_DEPARTMENT_OTHER)
Admission: EM | Admit: 2022-11-23 | Discharge: 2022-11-24 | Disposition: A | Discharge status: Home / Self Care | Payer: Medicare Other | Attending: Emergency Medicine | Admitting: Emergency Medicine

## 2022-11-23 ENCOUNTER — Encounter (HOSPITAL_BASED_OUTPATIENT_CLINIC_OR_DEPARTMENT_OTHER): Payer: Self-pay

## 2022-11-23 ENCOUNTER — Other Ambulatory Visit: Payer: Self-pay

## 2022-11-23 ENCOUNTER — Telehealth: Payer: Self-pay

## 2022-11-23 DIAGNOSIS — Z79899 Other long term (current) drug therapy: Secondary | ICD-10-CM | POA: Insufficient documentation

## 2022-11-23 DIAGNOSIS — Z8546 Personal history of malignant neoplasm of prostate: Secondary | ICD-10-CM | POA: Diagnosis not present

## 2022-11-23 DIAGNOSIS — E119 Type 2 diabetes mellitus without complications: Secondary | ICD-10-CM | POA: Diagnosis not present

## 2022-11-23 DIAGNOSIS — Z794 Long term (current) use of insulin: Secondary | ICD-10-CM | POA: Diagnosis not present

## 2022-11-23 DIAGNOSIS — L0231 Cutaneous abscess of buttock: Secondary | ICD-10-CM | POA: Diagnosis present

## 2022-11-23 DIAGNOSIS — I10 Essential (primary) hypertension: Secondary | ICD-10-CM | POA: Insufficient documentation

## 2022-11-23 LAB — CBG MONITORING, ED: Glucose-Capillary: 147 mg/dL — ABNORMAL HIGH (ref 70–99)

## 2022-11-23 MED ORDER — LIDOCAINE-EPINEPHRINE (PF) 1 %-1:200000 IJ SOLN
10.0000 mL | Freq: Once | INTRAMUSCULAR | Status: AC
  Administered 2022-11-23: 10 mL
  Filled 2022-11-23: qty 30

## 2022-11-23 NOTE — ED Provider Notes (Signed)
[facility] EMERGENCY DEPT Provider Note   CSN: [id number] Arrival date & time: 11/23/22  1832     History {Add pertinent medical, surgical, social history, OB history to HPI:1} Chief Complaint  Patient presents with   Abscess    Brendan [name] is a 67 y.o. male.  The history is provided by the patient.  Abscess He has history of hypertension, diabetes, hyperlipidemia, sickle cell trait, prostate cancer, acanthosis nigricans and comes in because of a boil on the left buttock which is not present for the last week but got much bigger over the last 24 hours.  He has been trying to use local heat without any benefit.  He had a similar lesion about a year ago which was successfully treated with antibiotics.  He denies fever or chills.  He went to an urgent care center where they advised him to come to the hospital.   Home Medications Prior to Admission medications   Medication Sig Start Date End Date Taking? Authorizing Provider  acetaminophen (TYLENOL) 500 MG tablet Take 500 mg by mouth every 6 (six) hours as needed for mild pain.    [provider]  Blood Glucose Monitoring Suppl (ONE TOUCH ULTRA SYSTEM KIT) W/DEVICE KIT 1 kit by Does not apply route once. One touch ultrasoft lancets    [provider]  brimonidine-timolol (COMBIGAN) 0.2-0.5 % ophthalmic solution Place 1 drop into both eyes every 12 (twelve) hours.    [provider]  carvedilol (COREG) 25 MG tablet Take 1 tablet (25 mg total) by mouth 2 (two) times daily. Patient taking differently: Take 25 mg by mouth daily. 08/25/20   Skeet Latch, MD  FARXIGA 10 MG TABS tablet Take 10 mg by mouth daily. 01/21/21   [provider]  Febuxostat 80 MG TABS Take 80 mg by mouth daily. 02/09/21   [provider]  hydrALAZINE (APRESOLINE) 100 MG tablet Take 1 tablet (100 mg total) by mouth 3 (three) times daily. 11/15/22   Skeet Latch, MD  Insulin Glargine [facility]) 100  UNIT/ML Inject into the skin. 08/16/22   [provider]  Insulin Lispro Prot & Lispro (HUMALOG MIX 75/25 KWIKPEN) (75-25) 100 UNIT/ML Kwikpen 60 UNITS AT BEGINNING OF BREAKFAST & 50 UNITS AT BEGINNING OF EVENING MEAL SUBCUTANEOUS TWICE A DAY Subcutaneous for 30 Patient not taking: Reported on 09/07/2022    [provider]  KERENDIA 20 MG TABS Take 1 tablet by mouth every morning. 12/22/21   [provider]  levothyroxine (SYNTHROID) 137 MCG tablet Take 137 mcg by mouth every morning. 01/08/22   [provider]  Potassium Chloride ER 20 MEQ TBCR TAKE 2 TABLETS TWICE A DAY 04/18/22   Bensimhon, Shaune Pascal, MD  simvastatin (ZOCOR) 20 MG tablet Take 20 mg by mouth every evening.    [provider]  telmisartan (MICARDIS) 80 MG tablet TAKE 1 TABLET (80 MG TOTAL) BY MOUTH IN THE MORNING AND AT BEDTIME. 04/02/22   Skeet Latch, MD  torsemide (DEMADEX) 20 MG tablet Take 20 mg by mouth daily. Take 1 Tablet Daily    [provider]  torsemide (DEMADEX) 20 MG tablet TAKE 40 MG BY MOUTH DAILY. 09/21/22   O'NealCassie Freer, MD  TRULICITY 4.5 GU/5.4YH SOPN SMARTSIG:4.5 Milligram(s) SUB-Q Every 4 Weeks 07/20/22   [provider]      Allergies    Ace inhibitors, Maxidex [dexamethasone], and Verapamil    Review of Systems   Review of Systems  All other systems reviewed  and are negative.   Physical Exam Updated Vital Signs BP (!) 192/81 (BP Location: Right Arm)   Pulse 70   Temp 98.3 F (36.8 C)   Resp 16   Ht '6\' 2"'$  (1.88 m)   Wt 131.5 kg   SpO2 100%   BMI 37.23 kg/m  Physical Exam Vitals and nursing note reviewed.   67 year old male, resting comfortably and in no acute distress. Vital signs are significant for elevated blood pressure. Oxygen saturation is 100%, which is normal. Head is normocephalic and atraumatic. PERRLA, EOMI. Oropharynx is clear. Neck is nontender and supple without adenopathy or JVD. Back is nontender and there  is no CVA tenderness. Lungs are clear without rales, wheezes, or rhonchi. Chest is nontender. Heart has regular rate and rhythm without murmur. Abdomen is soft, flat, nontender. Rectal: Large fluctuant lesion on the left buttock measuring 13 x 13 cm.  There is no overlying erythema or warmth.  Lesion is clearly separated from the anus. Extremities have no cyanosis or edema, full range of motion is present. Skin is warm and dry without rash. Neurologic: Mental status is normal, cranial nerves are intact, moves all extremities equally.  ED Results / Procedures / Treatments   Labs (all labs ordered are listed, but only abnormal results are displayed) Labs Reviewed - No data to display  Procedures Procedures  {Document cardiac monitor, telemetry assessment procedure when appropriate:1}  Medications Ordered in ED Medications - No data to display  ED Course/ Medical Decision Making/ A&P                           Medical Decision Making  Large left gluteal abscess.  This is clearly separated from the anus and rectum, appropriate for incision and drainage in the emergency department.  {Document critical care time when appropriate:1} {Document review of labs and clinical decision tools ie heart score, Chads2Vasc2 etc:1}  {Document your independent review of radiology images, and any outside records:1} {Document your discussion with family members, caretakers, and with consultants:1} {Document social determinants of health affecting pt's care:1} {Document your decision making why or why not admission, treatments were needed:1} Final Clinical Impression(s) / ED Diagnoses Final diagnoses:  None    Rx / DC Orders ED Discharge Orders     None

## 2022-11-23 NOTE — Telephone Encounter (Signed)
ATC pt, unable to LVM. If pt calls back please schedule him for appt with Roxan Diesel for a CPAP compliance OV before 01/13/2023

## 2022-11-23 NOTE — ED Triage Notes (Signed)
Pt c/o abscess to L buttocks x 1 week. Pt states he went to [facility], but they sent him here.

## 2022-11-24 MED ORDER — DOXYCYCLINE HYCLATE 100 MG PO TABS
100.0000 mg | ORAL_TABLET | Freq: Once | ORAL | Status: AC
Start: 2022-11-24 — End: 2022-11-24
  Administered 2022-11-24: 100 mg via ORAL
  Filled 2022-11-24: qty 1

## 2022-11-24 MED ORDER — DOXYCYCLINE HYCLATE 100 MG PO CAPS: 100.0000 mg | ORAL_CAPSULE | Freq: Two times a day (BID) | ORAL | 0 refills | Status: DC

## 2023-01-07 ENCOUNTER — Inpatient Hospital Stay (HOSPITAL_COMMUNITY)
Admission: EM | Admit: 2023-01-07 | Discharge: 2023-01-11 | DRG: 291 | Disposition: A | Discharge status: Home / Self Care | Payer: Medicare Other | Attending: Internal Medicine | Admitting: Internal Medicine

## 2023-01-07 ENCOUNTER — Encounter (HOSPITAL_COMMUNITY): Payer: Self-pay

## 2023-01-07 ENCOUNTER — Other Ambulatory Visit: Payer: Self-pay

## 2023-01-07 ENCOUNTER — Emergency Department (HOSPITAL_COMMUNITY): Payer: Medicare Other

## 2023-01-07 ENCOUNTER — Ambulatory Visit
Admission: RE | Admit: 2023-01-07 | Discharge: 2023-01-07 | Disposition: A | Discharge status: Home / Self Care | Payer: Medicare Other | Source: Ambulatory Visit | Attending: Internal Medicine | Admitting: Internal Medicine

## 2023-01-07 ENCOUNTER — Other Ambulatory Visit: Payer: Self-pay | Admitting: Internal Medicine

## 2023-01-07 DIAGNOSIS — E119 Type 2 diabetes mellitus without complications: Secondary | ICD-10-CM

## 2023-01-07 DIAGNOSIS — Z8546 Personal history of malignant neoplasm of prostate: Secondary | ICD-10-CM

## 2023-01-07 DIAGNOSIS — E1142 Type 2 diabetes mellitus with diabetic polyneuropathy: Secondary | ICD-10-CM | POA: Diagnosis present

## 2023-01-07 DIAGNOSIS — I152 Hypertension secondary to endocrine disorders: Secondary | ICD-10-CM | POA: Diagnosis present

## 2023-01-07 DIAGNOSIS — E039 Hypothyroidism, unspecified: Secondary | ICD-10-CM | POA: Diagnosis present

## 2023-01-07 DIAGNOSIS — N184 Chronic kidney disease, stage 4 (severe): Principal | ICD-10-CM | POA: Diagnosis present

## 2023-01-07 DIAGNOSIS — D573 Sickle-cell trait: Secondary | ICD-10-CM | POA: Diagnosis present

## 2023-01-07 DIAGNOSIS — Z794 Long term (current) use of insulin: Secondary | ICD-10-CM

## 2023-01-07 DIAGNOSIS — Z79899 Other long term (current) drug therapy: Secondary | ICD-10-CM

## 2023-01-07 DIAGNOSIS — I10 Essential (primary) hypertension: Secondary | ICD-10-CM | POA: Diagnosis present

## 2023-01-07 DIAGNOSIS — I130 Hypertensive heart and chronic kidney disease with heart failure and stage 1 through stage 4 chronic kidney disease, or unspecified chronic kidney disease: Principal | ICD-10-CM | POA: Diagnosis present

## 2023-01-07 DIAGNOSIS — R0600 Dyspnea, unspecified: Secondary | ICD-10-CM

## 2023-01-07 DIAGNOSIS — Z8249 Family history of ischemic heart disease and other diseases of the circulatory system: Secondary | ICD-10-CM

## 2023-01-07 DIAGNOSIS — E669 Obesity, unspecified: Secondary | ICD-10-CM | POA: Diagnosis present

## 2023-01-07 DIAGNOSIS — Z006 Encounter for examination for normal comparison and control in clinical research program: Secondary | ICD-10-CM

## 2023-01-07 DIAGNOSIS — Z888 Allergy status to other drugs, medicaments and biological substances status: Secondary | ICD-10-CM

## 2023-01-07 DIAGNOSIS — Z825 Family history of asthma and other chronic lower respiratory diseases: Secondary | ICD-10-CM

## 2023-01-07 DIAGNOSIS — E1122 Type 2 diabetes mellitus with diabetic chronic kidney disease: Secondary | ICD-10-CM | POA: Diagnosis present

## 2023-01-07 DIAGNOSIS — I509 Heart failure, unspecified: Secondary | ICD-10-CM

## 2023-01-07 DIAGNOSIS — Z823 Family history of stroke: Secondary | ICD-10-CM

## 2023-01-07 DIAGNOSIS — Z833 Family history of diabetes mellitus: Secondary | ICD-10-CM

## 2023-01-07 DIAGNOSIS — E876 Hypokalemia: Secondary | ICD-10-CM | POA: Diagnosis not present

## 2023-01-07 DIAGNOSIS — N179 Acute kidney failure, unspecified: Secondary | ICD-10-CM | POA: Diagnosis present

## 2023-01-07 DIAGNOSIS — E0590 Thyrotoxicosis, unspecified without thyrotoxic crisis or storm: Secondary | ICD-10-CM | POA: Diagnosis present

## 2023-01-07 DIAGNOSIS — G4733 Obstructive sleep apnea (adult) (pediatric): Secondary | ICD-10-CM | POA: Diagnosis present

## 2023-01-07 DIAGNOSIS — I5033 Acute on chronic diastolic (congestive) heart failure: Secondary | ICD-10-CM | POA: Diagnosis present

## 2023-01-07 DIAGNOSIS — E11649 Type 2 diabetes mellitus with hypoglycemia without coma: Secondary | ICD-10-CM | POA: Diagnosis not present

## 2023-01-07 DIAGNOSIS — E7800 Pure hypercholesterolemia, unspecified: Secondary | ICD-10-CM | POA: Diagnosis present

## 2023-01-07 DIAGNOSIS — Z6839 Body mass index (BMI) 39.0-39.9, adult: Secondary | ICD-10-CM

## 2023-01-07 DIAGNOSIS — Z79890 Hormone replacement therapy (postmenopausal): Secondary | ICD-10-CM

## 2023-01-07 LAB — BASIC METABOLIC PANEL
Anion gap: 9 (ref 5–15)
BUN: 26 mg/dL — ABNORMAL HIGH (ref 8–23)
CO2: 26 mmol/L (ref 22–32)
Calcium: 8.5 mg/dL — ABNORMAL LOW (ref 8.9–10.3)
Chloride: 103 mmol/L (ref 98–111)
Creatinine, Ser: 2.43 mg/dL — ABNORMAL HIGH (ref 0.61–1.24)
GFR, Estimated: 28 mL/min — ABNORMAL LOW (ref >60)
Glucose, Bld: 229 mg/dL — ABNORMAL HIGH (ref 70–99)
Potassium: 3.7 mmol/L (ref 3.5–5.1)
Sodium: 138 mmol/L (ref 135–145)

## 2023-01-07 LAB — CBC
HCT: 32.3 % — ABNORMAL LOW (ref 39.0–52.0)
Hemoglobin: 10.8 g/dL — ABNORMAL LOW (ref 13.0–17.0)
MCH: 27.3 pg (ref 26.0–34.0)
MCHC: 33.4 g/dL (ref 30.0–36.0)
MCV: 81.6 fL (ref 80.0–100.0)
Platelets: 376 10*3/uL (ref 150–400)
RBC: 3.96 MIL/uL — ABNORMAL LOW (ref 4.22–5.81)
RDW: 14.5 % (ref 11.5–15.5)
WBC: 14.5 10*3/uL — ABNORMAL HIGH (ref 4.0–10.5)
nRBC: 0 % (ref 0.0–0.2)

## 2023-01-07 LAB — TROPONIN I (HIGH SENSITIVITY)
Troponin I (High Sensitivity): 24 ng/L — ABNORMAL HIGH (ref <18)
Troponin I (High Sensitivity): 24 ng/L — ABNORMAL HIGH (ref <18)

## 2023-01-07 NOTE — ED Provider Triage Note (Signed)
Emergency Medicine Provider Triage Evaluation Note  Brendan [name]. , a 67 y.o. male  was evaluated in triage.  Pt complains of SOB and intermittent left sided CP x 3 weeks. DOE, leg swelling. Was seen at PCP today, they were concerned for CHF exacerbation, obtained lab work and called him to tell him his troponin values were elevated -- did not tell him the numbers. Hx of CHF, reports compliance with lasix. Having difficulty even walking 10 feet without getting very short of breath.   Review of Systems  Positive: CP, SOB, leg swelling Negative: Syncope, N/V/D  Physical Exam  BP (!) 193/81   Pulse 63   Temp (!) 97.4 F (36.3 C)   Resp 20   Ht 6' 2"$  (1.88 m)   Wt (!) 142.9 kg   SpO2 98%   BMI 40.44 kg/m  Gen:   Awake, no distress   Resp:  Normal effort  MSK:   Moves extremities without difficulty  Other:    Medical Decision Making  Medically screening exam initiated at 7:16 PM.  Appropriate orders placed.  Brendan [name]. was informed that the remainder of the evaluation will be completed by another provider, this initial triage assessment does not replace that evaluation, and the importance of remaining in the ED until their evaluation is complete.  Per PCP at [facility]: "Patient appears to be having a CHF exacerbation despite doubling his diuretic over the last week. His symptoms seem to have progressed since stopping his [place] in January. Will check EKG to rule out acute coronary event we will check labs to rule out progression of renal dysfunction as a cause for his CHF exacerbation. We will double his diuretic. Will add troponin to rule out ischemia, he already has an appointment this week with his cardiologist."   Brendan Plummer, PA-C 01/07/23 1916

## 2023-01-07 NOTE — ED Triage Notes (Addendum)
Says he was sent to ER by PCP after presenting with intermittent left lower chest pains and shob on exertion ongoing for 3 weeks.   Was notified of elevated troponin's but unsure of the value.   Hx CHF. Compliant on Lasix. Says it is hard to walk ~10 feet without trouble breathing or cp.

## 2023-01-07 NOTE — ED Notes (Signed)
Lab called to add on BNP 

## 2023-01-08 ENCOUNTER — Observation Stay (HOSPITAL_COMMUNITY): Payer: Medicare Other

## 2023-01-08 DIAGNOSIS — I5033 Acute on chronic diastolic (congestive) heart failure: Secondary | ICD-10-CM

## 2023-01-08 DIAGNOSIS — I158 Other secondary hypertension: Secondary | ICD-10-CM

## 2023-01-08 DIAGNOSIS — Z8546 Personal history of malignant neoplasm of prostate: Secondary | ICD-10-CM | POA: Diagnosis not present

## 2023-01-08 DIAGNOSIS — Z6839 Body mass index (BMI) 39.0-39.9, adult: Secondary | ICD-10-CM | POA: Diagnosis not present

## 2023-01-08 DIAGNOSIS — Z794 Long term (current) use of insulin: Secondary | ICD-10-CM

## 2023-01-08 DIAGNOSIS — N179 Acute kidney failure, unspecified: Secondary | ICD-10-CM | POA: Diagnosis present

## 2023-01-08 DIAGNOSIS — Z8249 Family history of ischemic heart disease and other diseases of the circulatory system: Secondary | ICD-10-CM | POA: Diagnosis not present

## 2023-01-08 DIAGNOSIS — I130 Hypertensive heart and chronic kidney disease with heart failure and stage 1 through stage 4 chronic kidney disease, or unspecified chronic kidney disease: Secondary | ICD-10-CM | POA: Diagnosis present

## 2023-01-08 DIAGNOSIS — Z79890 Hormone replacement therapy (postmenopausal): Secondary | ICD-10-CM | POA: Diagnosis not present

## 2023-01-08 DIAGNOSIS — G4733 Obstructive sleep apnea (adult) (pediatric): Secondary | ICD-10-CM | POA: Diagnosis not present

## 2023-01-08 DIAGNOSIS — Z833 Family history of diabetes mellitus: Secondary | ICD-10-CM | POA: Diagnosis not present

## 2023-01-08 DIAGNOSIS — E039 Hypothyroidism, unspecified: Secondary | ICD-10-CM

## 2023-01-08 DIAGNOSIS — N184 Chronic kidney disease, stage 4 (severe): Secondary | ICD-10-CM

## 2023-01-08 DIAGNOSIS — Z006 Encounter for examination for normal comparison and control in clinical research program: Secondary | ICD-10-CM | POA: Diagnosis not present

## 2023-01-08 DIAGNOSIS — E7800 Pure hypercholesterolemia, unspecified: Secondary | ICD-10-CM | POA: Diagnosis present

## 2023-01-08 DIAGNOSIS — E1142 Type 2 diabetes mellitus with diabetic polyneuropathy: Secondary | ICD-10-CM | POA: Diagnosis present

## 2023-01-08 DIAGNOSIS — E119 Type 2 diabetes mellitus without complications: Secondary | ICD-10-CM | POA: Diagnosis not present

## 2023-01-08 DIAGNOSIS — D573 Sickle-cell trait: Secondary | ICD-10-CM | POA: Diagnosis present

## 2023-01-08 DIAGNOSIS — E1122 Type 2 diabetes mellitus with diabetic chronic kidney disease: Secondary | ICD-10-CM | POA: Diagnosis present

## 2023-01-08 DIAGNOSIS — E11649 Type 2 diabetes mellitus with hypoglycemia without coma: Secondary | ICD-10-CM | POA: Diagnosis not present

## 2023-01-08 DIAGNOSIS — Z79899 Other long term (current) drug therapy: Secondary | ICD-10-CM | POA: Diagnosis not present

## 2023-01-08 DIAGNOSIS — Z823 Family history of stroke: Secondary | ICD-10-CM | POA: Diagnosis not present

## 2023-01-08 DIAGNOSIS — E876 Hypokalemia: Secondary | ICD-10-CM | POA: Diagnosis not present

## 2023-01-08 DIAGNOSIS — I5032 Chronic diastolic (congestive) heart failure: Secondary | ICD-10-CM | POA: Diagnosis not present

## 2023-01-08 DIAGNOSIS — E0590 Thyrotoxicosis, unspecified without thyrotoxic crisis or storm: Secondary | ICD-10-CM | POA: Diagnosis present

## 2023-01-08 DIAGNOSIS — Z825 Family history of asthma and other chronic lower respiratory diseases: Secondary | ICD-10-CM | POA: Diagnosis not present

## 2023-01-08 DIAGNOSIS — E669 Obesity, unspecified: Secondary | ICD-10-CM | POA: Diagnosis present

## 2023-01-08 HISTORY — DX: Chronic kidney disease, stage 4 (severe): N18.4

## 2023-01-08 LAB — BRAIN NATRIURETIC PEPTIDE: B Natriuretic Peptide: 722.9 pg/mL — ABNORMAL HIGH (ref 0.0–100.0)

## 2023-01-08 LAB — CBC
HCT: 34.3 % — ABNORMAL LOW (ref 39.0–52.0)
Hemoglobin: 11.1 g/dL — ABNORMAL LOW (ref 13.0–17.0)
MCH: 26.7 pg (ref 26.0–34.0)
MCHC: 32.4 g/dL (ref 30.0–36.0)
MCV: 82.7 fL (ref 80.0–100.0)
Platelets: 390 10*3/uL (ref 150–400)
RBC: 4.15 MIL/uL — ABNORMAL LOW (ref 4.22–5.81)
RDW: 14.5 % (ref 11.5–15.5)
WBC: 12.7 10*3/uL — ABNORMAL HIGH (ref 4.0–10.5)
nRBC: 0 % (ref 0.0–0.2)

## 2023-01-08 LAB — ECHOCARDIOGRAM COMPLETE
Area-P 1/2: 1.86 cm2
Height: 74 in
S' Lateral: 4.9 cm
Weight: 4970.05 oz

## 2023-01-08 LAB — HEMOGLOBIN A1C
Hgb A1c MFr Bld: 8.6 % — ABNORMAL HIGH (ref 4.8–5.6)
Mean Plasma Glucose: 200.12 mg/dL

## 2023-01-08 LAB — BASIC METABOLIC PANEL
Anion gap: 10 (ref 5–15)
Anion gap: 11 (ref 5–15)
BUN: 29 mg/dL — ABNORMAL HIGH (ref 8–23)
BUN: 27 mg/dL — ABNORMAL HIGH (ref 8–23)
CO2: 26 mmol/L (ref 22–32)
CO2: 27 mmol/L (ref 22–32)
Calcium: 8.4 mg/dL — ABNORMAL LOW (ref 8.9–10.3)
Calcium: 8.9 mg/dL (ref 8.9–10.3)
Chloride: 100 mmol/L (ref 98–111)
Chloride: 103 mmol/L (ref 98–111)
Creatinine, Ser: 2.43 mg/dL — ABNORMAL HIGH (ref 0.61–1.24)
Creatinine, Ser: 2.23 mg/dL — ABNORMAL HIGH (ref 0.61–1.24)
GFR, Estimated: 28 mL/min — ABNORMAL LOW (ref >60)
GFR, Estimated: 32 mL/min — ABNORMAL LOW (ref >60)
Glucose, Bld: 329 mg/dL — ABNORMAL HIGH (ref 70–99)
Glucose, Bld: 65 mg/dL — ABNORMAL LOW (ref 70–99)
Potassium: 3.3 mmol/L — ABNORMAL LOW (ref 3.5–5.1)
Potassium: 2.9 mmol/L — ABNORMAL LOW (ref 3.5–5.1)
Sodium: 136 mmol/L (ref 135–145)
Sodium: 141 mmol/L (ref 135–145)

## 2023-01-08 LAB — MAGNESIUM: Magnesium: 1.9 mg/dL (ref 1.7–2.4)

## 2023-01-08 LAB — GLUCOSE, CAPILLARY
Glucose-Capillary: 185 mg/dL — ABNORMAL HIGH (ref 70–99)
Glucose-Capillary: 179 mg/dL — ABNORMAL HIGH (ref 70–99)
Glucose-Capillary: 125 mg/dL — ABNORMAL HIGH (ref 70–99)
Glucose-Capillary: 65 mg/dL — ABNORMAL LOW (ref 70–99)
Glucose-Capillary: 161 mg/dL — ABNORMAL HIGH (ref 70–99)

## 2023-01-08 LAB — HIV ANTIBODY (ROUTINE TESTING W REFLEX): HIV Screen 4th Generation wRfx: NONREACTIVE

## 2023-01-08 MED ORDER — ONDANSETRON HCL 4 MG/2ML IJ SOLN: 4.0000 mg | Freq: Four times a day (QID) | INTRAMUSCULAR | Status: DC | PRN

## 2023-01-08 MED ORDER — ACETAMINOPHEN 325 MG PO TABS: 650.0000 mg | ORAL_TABLET | ORAL | Status: DC | PRN

## 2023-01-08 MED ORDER — INSULIN ASPART 100 UNIT/ML IJ SOLN
4.0000 [IU] | Freq: Three times a day (TID) | INTRAMUSCULAR | Status: DC
  Administered 2023-01-08 – 2023-01-09 (×3): 4 [IU] via SUBCUTANEOUS

## 2023-01-08 MED ORDER — FUROSEMIDE 10 MG/ML IJ SOLN
80.0000 mg | Freq: Two times a day (BID) | INTRAMUSCULAR | Status: DC
  Administered 2023-01-08 – 2023-01-10 (×4): 80 mg via INTRAVENOUS
  Filled 2023-01-08 (×4): qty 8

## 2023-01-08 MED ORDER — DAPAGLIFLOZIN PROPANEDIOL 10 MG PO TABS
10.0000 mg | ORAL_TABLET | Freq: Every day | ORAL | Status: DC
  Administered 2023-01-08 – 2023-01-11 (×4): 10 mg via ORAL
  Filled 2023-01-08 (×4): qty 1

## 2023-01-08 MED ORDER — IRBESARTAN 150 MG PO TABS: 150.0000 mg | ORAL_TABLET | Freq: Every day | ORAL | Status: DC

## 2023-01-08 MED ORDER — SODIUM CHLORIDE 0.9 % IV SOLN: 250.0000 mL | INTRAVENOUS | Status: DC | PRN

## 2023-01-08 MED ORDER — SIMVASTATIN 20 MG PO TABS
20.0000 mg | ORAL_TABLET | Freq: Every evening | ORAL | Status: DC
  Administered 2023-01-08 – 2023-01-10 (×3): 20 mg via ORAL
  Filled 2023-01-08 (×3): qty 1

## 2023-01-08 MED ORDER — BRIMONIDINE TARTRATE 0.2 % OP SOLN
1.0000 [drp] | Freq: Two times a day (BID) | OPHTHALMIC | Status: DC
  Administered 2023-01-08 – 2023-01-11 (×7): 1 [drp] via OPHTHALMIC
  Filled 2023-01-08: qty 5

## 2023-01-08 MED ORDER — FINERENONE 20 MG PO TABS: 1.0000 | ORAL_TABLET | Freq: Every morning | ORAL | Status: DC

## 2023-01-08 MED ORDER — FINERENONE 20 MG PO TABS
1.0000 | ORAL_TABLET | Freq: Every morning | ORAL | Status: DC
  Administered 2023-01-08 – 2023-01-10 (×3): 20 mg via ORAL
  Filled 2023-01-08 (×7): qty 1

## 2023-01-08 MED ORDER — POTASSIUM CHLORIDE CRYS ER 20 MEQ PO TBCR
40.0000 meq | EXTENDED_RELEASE_TABLET | Freq: Once | ORAL | Status: AC
  Administered 2023-01-08: 40 meq via ORAL
  Filled 2023-01-08: qty 2

## 2023-01-08 MED ORDER — NITROGLYCERIN 2 % TD OINT
1.0000 [in_us] | TOPICAL_OINTMENT | Freq: Four times a day (QID) | TRANSDERMAL | Status: DC
  Filled 2023-01-08: qty 30

## 2023-01-08 MED ORDER — CARVEDILOL 12.5 MG PO TABS
25.0000 mg | ORAL_TABLET | Freq: Once | ORAL | Status: AC
  Administered 2023-01-08: 25 mg via ORAL
  Filled 2023-01-08: qty 2

## 2023-01-08 MED ORDER — LEVOTHYROXINE SODIUM 25 MCG PO TABS
137.0000 ug | ORAL_TABLET | Freq: Every day | ORAL | Status: DC
  Administered 2023-01-08 – 2023-01-11 (×4): 137 ug via ORAL
  Filled 2023-01-08 (×4): qty 1

## 2023-01-08 MED ORDER — SODIUM CHLORIDE 0.9% FLUSH: 3.0000 mL | INTRAVENOUS | Status: DC | PRN

## 2023-01-08 MED ORDER — DULAGLUTIDE 4.5 MG/0.5ML ~~LOC~~ SOAJ: 4.5000 mg | SUBCUTANEOUS | Status: DC

## 2023-01-08 MED ORDER — HYDRALAZINE HCL 20 MG/ML IJ SOLN: 10.0000 mg | INTRAMUSCULAR | Status: DC | PRN

## 2023-01-08 MED ORDER — HYDRALAZINE HCL 50 MG PO TABS
100.0000 mg | ORAL_TABLET | Freq: Three times a day (TID) | ORAL | Status: DC
  Administered 2023-01-08 – 2023-01-11 (×10): 100 mg via ORAL
  Filled 2023-01-08 (×10): qty 2

## 2023-01-08 MED ORDER — FUROSEMIDE 10 MG/ML IJ SOLN
40.0000 mg | INTRAMUSCULAR | Status: AC
  Administered 2023-01-08: 40 mg via INTRAVENOUS
  Filled 2023-01-08: qty 4

## 2023-01-08 MED ORDER — INSULIN GLARGINE-YFGN 100 UNIT/ML ~~LOC~~ SOLN
38.0000 [IU] | Freq: Every day | SUBCUTANEOUS | Status: DC
  Administered 2023-01-08 – 2023-01-09 (×2): 38 [IU] via SUBCUTANEOUS
  Filled 2023-01-08 (×4): qty 0.38

## 2023-01-08 MED ORDER — INSULIN ASPART 100 UNIT/ML IJ SOLN
0.0000 [IU] | Freq: Three times a day (TID) | INTRAMUSCULAR | Status: DC
  Administered 2023-01-08 – 2023-01-09 (×3): 3 [IU] via SUBCUTANEOUS

## 2023-01-08 MED ORDER — HYDRALAZINE HCL 25 MG PO TABS
100.0000 mg | ORAL_TABLET | Freq: Once | ORAL | Status: AC
  Administered 2023-01-08: 100 mg via ORAL
  Filled 2023-01-08: qty 4

## 2023-01-08 MED ORDER — TIMOLOL MALEATE 0.5 % OP SOLN
1.0000 [drp] | Freq: Two times a day (BID) | OPHTHALMIC | Status: DC
  Administered 2023-01-08 – 2023-01-11 (×7): 1 [drp] via OPHTHALMIC
  Filled 2023-01-08: qty 5

## 2023-01-08 MED ORDER — BRIMONIDINE TARTRATE-TIMOLOL 0.2-0.5 % OP SOLN: 1.0000 [drp] | Freq: Two times a day (BID) | OPHTHALMIC | Status: DC

## 2023-01-08 MED ORDER — CARVEDILOL 25 MG PO TABS
25.0000 mg | ORAL_TABLET | Freq: Two times a day (BID) | ORAL | Status: DC
  Administered 2023-01-08 – 2023-01-11 (×7): 25 mg via ORAL
  Filled 2023-01-08 (×7): qty 1

## 2023-01-08 MED ORDER — INSULIN ASPART 100 UNIT/ML IJ SOLN: 0.0000 [IU] | Freq: Every day | INTRAMUSCULAR | Status: DC

## 2023-01-08 MED ORDER — ENOXAPARIN SODIUM 40 MG/0.4ML IJ SOSY
40.0000 mg | PREFILLED_SYRINGE | INTRAMUSCULAR | Status: DC
  Administered 2023-01-08 – 2023-01-11 (×4): 40 mg via SUBCUTANEOUS
  Filled 2023-01-08 (×4): qty 0.4

## 2023-01-08 MED ORDER — FUROSEMIDE 10 MG/ML IJ SOLN
40.0000 mg | Freq: Every day | INTRAMUSCULAR | Status: DC
  Administered 2023-01-08: 40 mg via INTRAVENOUS
  Filled 2023-01-08: qty 4

## 2023-01-08 MED ORDER — AMLODIPINE BESYLATE 5 MG PO TABS
5.0000 mg | ORAL_TABLET | Freq: Every day | ORAL | Status: DC
  Administered 2023-01-08 – 2023-01-09 (×2): 5 mg via ORAL
  Filled 2023-01-08 (×2): qty 1

## 2023-01-08 MED ORDER — SODIUM CHLORIDE 0.9% FLUSH
3.0000 mL | Freq: Two times a day (BID) | INTRAVENOUS | Status: DC
  Administered 2023-01-08 – 2023-01-10 (×6): 3 mL via INTRAVENOUS

## 2023-01-08 MED ORDER — PERFLUTREN LIPID MICROSPHERE
1.0000 mL | INTRAVENOUS | Status: AC | PRN
  Administered 2023-01-08: 3 mL via INTRAVENOUS

## 2023-01-08 NOTE — Assessment & Plan Note (Signed)
Continue home CPAP ?

## 2023-01-08 NOTE — Assessment & Plan Note (Addendum)
Hypoglycemia.   Patient will resume his insulin regimen from home with close follow up on capillary glucose. His discharge fasting insulin is 249.  During his hospitalization his insulin dose was decreased due to hypoglycemia.

## 2023-01-08 NOTE — H&P (Signed)
History and Physical    Patient: Brendan [name]. HN:[id number] DOB: [birth year] DOA: 01/07/2023 DOS: the patient was seen and examined on 01/08/2023 PCP: Seward Carol, MD  Patient coming from: Home  Chief Complaint:  Chief Complaint  Patient presents with   Shortness of Breath   HPI: Brendan [name]. is a 67 y.o. male with medical history significant of HTN, hyperaldosteronism, DM2, CKD 4, HFpEF with grade 1 DD on echo in Sept 2023.  Pt presents to ED with c/o SOB, DOE, 30lb wt gain, peripheral edema.  Symptoms progressively worsening over past 3 weeks.  Initially SOB worse with exertion, now occurring at rest as well.  He was seen by PCP today and sent in due to abnormal labs and CHF exacerbation.   No cough, no fever.  Has intermittent CP though none at time of evaluation.  Review of Systems: As mentioned in the history of present illness. All other systems reviewed and are negative. Past Medical History:  Diagnosis Date   Acanthosis nigricans    Atopic dermatitis    Diabetes ([place]) 2004   Erectile dysfunction    Fatty liver 2007   Glaucoma 2008   Gynecomastia 2009   Hyperaldosteronism ([place]) 2000   Hypercholesterolemia    Hyperlipidemia    Hypertension    Hypoglycemic reaction    Hypothyroidism 2004   Incontinence    Intermittent vertigo 2012   Left cervical radiculopathy 2012   Lumbar radiculopathy    Obesity    Peripheral neuropathy    Pollen allergies 2007   perennial   Prostate cancer ([place]) 2006   Reflux esophagitis 1995   Sickle cell trait ([place]) 2006   Sleep apnea, obstructive 2000   uses a cpap   Venous insufficiency 2005   Vitamin D deficiency 2012   Past Surgical History:  Procedure Laterality Date   COLONOSCOPY  2006   normal   lap band surgery  2009   left inguinal hernia repair  1998   robotic prostatectomy  2008   Social History:  reports that he has never smoked. He has never used smokeless tobacco. He reports current alcohol use. He  reports that he does not use drugs.  Allergies  Allergen Reactions   Ace Inhibitors Cough   Maxidex [Dexamethasone] Other (See Comments)    Sexual dysfunction   Verapamil Other (See Comments)    constipation    Family History  Problem Relation Age of Onset   Hypertension Father        deceased age 99   Heart attack Father    Heart failure Mother    COPD Sister    CVA Sister    Diabetes Daughter    Colon cancer Neg Hx    Stomach cancer Neg Hx     Prior to Admission medications   Medication Sig Start Date End Date Taking? Authorizing Provider  acetaminophen (TYLENOL) 500 MG tablet Take 500 mg by mouth every 6 (six) hours as needed for mild pain.   Yes [provider]  brimonidine-timolol (COMBIGAN) 0.2-0.5 % ophthalmic solution Place 1 drop into both eyes every 12 (twelve) hours.   Yes [provider]  carvedilol (COREG) 25 MG tablet Take 1 tablet (25 mg total) by mouth 2 (two) times daily. Patient taking differently: Take 25 mg by mouth 2 (two) times daily with a meal. 08/25/20  Yes Skeet Latch, MD  HUMALOG KWIKPEN 200 UNIT/ML KwikPen Inject 22 Units into the skin 3 (three) times daily. PLUS 4 extra units  if blood sugar over 200 mg/dL 06/11/22  Yes [provider]  hydrALAZINE (APRESOLINE) 100 MG tablet Take 1 tablet (100 mg total) by mouth 3 (three) times daily. 11/15/22  Yes Skeet Latch, MD  Insulin Glargine [facility]) 100 UNIT/ML Inject 38 Units into the skin daily. 08/16/22  Yes [provider]  KERENDIA 20 MG TABS Take 1 tablet by mouth every morning. 12/22/21  Yes [provider]  levothyroxine (SYNTHROID) 137 MCG tablet Take 137 mcg by mouth every morning. 01/08/22  Yes [provider]  Potassium Chloride ER 20 MEQ TBCR TAKE 2 TABLETS TWICE A DAY Patient taking differently: Take 40 mEq by mouth 2 (two) times daily. 04/18/22  Yes Bensimhon, Shaune Pascal, MD  simvastatin (ZOCOR) 20 MG tablet Take 20 mg by mouth  every evening.   Yes [provider]  telmisartan (MICARDIS) 80 MG tablet TAKE 1 TABLET (80 MG TOTAL) BY MOUTH IN THE MORNING AND AT BEDTIME. Patient taking differently: Take 80 mg by mouth in the morning and at bedtime. 04/02/22  Yes Skeet Latch, MD  torsemide (DEMADEX) 20 MG tablet TAKE 40 MG BY MOUTH DAILY. Patient taking differently: Take 20 mg by mouth daily. 09/21/22  Yes O'Neal, Cassie Freer, MD  Blood Glucose Monitoring Suppl (ONE TOUCH ULTRA SYSTEM KIT) W/DEVICE KIT 1 kit by Does not apply route once. One touch ultrasoft lancets    [provider]  FARXIGA 10 MG TABS tablet Take 10 mg by mouth daily. Patient not taking: Reported on 01/08/2023 01/21/21   [provider]  TRULICITY 4.5 0000000 SOPN SMARTSIG:4.5 Milligram(s) SUB-Q Every 4 Weeks Patient not taking: Reported on 01/08/2023 07/20/22   [provider]    Physical Exam: Vitals:   01/08/23 0015 01/08/23 0040 01/08/23 0041 01/08/23 0100  BP: (!) 186/82 (!) 193/107  (!) 193/90  Pulse: 62 62  60  Resp: 19 18  18  $ Temp:   98.9 F (37.2 C)   TempSrc:   Oral   SpO2: 99% 98%  99%  Weight:      Height:       Constitutional: NAD, calm, comfortable Respiratory: clear to auscultation bilaterally, no wheezing, no crackles. Normal respiratory effort. No accessory muscle use.  Cardiovascular: Regular rate and rhythm, no murmurs / rubs / gallops. 2+ BLE pitting edema. 2+ pedal pulses. No carotid bruits.  Abdomen: no tenderness, no masses palpated. No hepatosplenomegaly. Bowel sounds positive.  Neurologic: CN 2-12 grossly intact. Sensation intact, DTR normal. Strength 5/5 in all 4.  Psychiatric: Normal judgment and insight. Alert and oriented x 3. Normal mood.   Data Reviewed:    Trop 24 and 24 BNP 722     Latest Ref Rng & Units 01/07/2023    7:18 PM 09/03/2022    8:49 AM 08/09/2022    3:24 PM  CMP  Glucose 70 - 99 mg/dL 229  322  351   BUN 8 - 23 mg/dL 26  26  21   $ Creatinine 0.61 -  1.24 mg/dL 2.43  2.17  2.14   Sodium 135 - 145 mmol/L 138  140  140   Potassium 3.5 - 5.1 mmol/L 3.7  3.7  3.5   Chloride 98 - 111 mmol/L 103  99  98   CO2 22 - 32 mmol/L 26  26  26   $ Calcium 8.9 - 10.3 mg/dL 8.5  8.9  9.2       Latest Ref Rng & Units 01/07/2023    7:18 PM 04/21/2022  9:35 AM 03/27/2020    5:42 AM  CBC  WBC 4.0 - 10.5 K/uL 14.5  13.4  10.5   Hemoglobin 13.0 - 17.0 g/dL 10.8  11.4  10.6   Hematocrit 39.0 - 52.0 % 32.3  34.6  32.3   Platelets 150 - 400 K/uL 376  433  356    CXR: IMPRESSION: Mild cardiomegaly with mild vascular congestion. No focal consolidation.  Assessment and Plan: * Acute on chronic diastolic CHF (congestive heart failure) (HCC) Nl EF, grade 1 DD as of Sept 2023 Echo. CHF pathway Tele monitor Lasix 39m IV x1 in ED and 44mIV daily ordered May need to increase depending on response given CKD 4, also see CKD 4 below. Cont home BB and ARB for now Strict intake and output Repeat 2d echo [facility] (chronic kidney disease) stage 4, GFR 15-29 ml/min (HCC) Creat 2.4, appears to be baseline. Daily BMP with diuresis to monitor  Hypothyroidism Continue synthroid  Hypertension Cont home Coreg, hydralazine, and micardis. Add NTG paste if BP still running high (Maxed on BB and hydralazine already, dont want to increase the ARB in patient with CKD 4). Consider CCB if still hypertensive despite everything.  Obstructive sleep apnea Continue home CPAP  Insulin-requiring or dependent type II diabetes mellitus ([place] glargine 38u daily Resume FaIrannd Trulicity Pt reports insurance will kick back in early March. SSI mod scale AC/HS And 4u mealtime coverage.      Advance Care Planning:   Code Status: Full Code  Consults: None  Family Communication: Wife at bedside  Severity of Illness: The appropriate patient status for this patient is OBSERVATION. Observation status is judged to be  reasonable and necessary in order to provide the required intensity of service to ensure the patient's safety. The patient's presenting symptoms, physical exam findings, and initial radiographic and laboratory data in the context of their medical condition is felt to place them at decreased risk for further clinical deterioration. Furthermore, it is anticipated that the patient will be medically stable for discharge from the hospital within 2 midnights of admission.   Author: GAEtta Quill DO 01/08/2023 1:28 AM  For on call review [email or web address]

## 2023-01-08 NOTE — Consult Note (Addendum)
Advanced Heart Failure Team Consult Note   Primary Physician: Seward Carol, MD HF-Cardiologist:  Dr Haroldine Laws  Cardiolgy: Dr Oval Linsey  Reason for Consultation: A/C HFpEF   HPI:    Brendan [name]. is seen today for evaluation of A/C HFpEF at the request of Dr Broadus John.   Mr Smethers is a 67 year old with a history of obesity, DMII, Hyperthyroidism, CKD Stage IIIb, HTN, OSA on CPAP, and HFpEF.   ECHO 03/2020 EF 55-60% Grade II DD. Moderate LVH , RV normal   Last seen in the HF clinic back in 2021. Stable at that time. Followed by general cardiology. Over the last few years torsemide was cut back to 20 mg daily due to CKD Stage IV.   2023 Echo EF 60-65% RV normal, Grade I DD, and mild concentric LVH.   He has not been weighing on a consistent basis. 30 days ago his weight was 288 pounds. 2 weeks ago his weight was up to 295 pounds. He has not been following low salt diet. Says he eats what he wants. Using CPAP at home.   Presented to the ED with increased SOB + DOE. Weight had gone up 30 pounds. Admitted with A/C HFpEF. CXR with vascular congestion. BNP 722 , HS Trop24>24, K 3.7, and creatinine 2.4. Started on IV lasix. Negative 1.4 liters   Feels a little better    Review of Systems: [y] = yes, [ ]$  = no   General: Weight gain [Y ]; Weight loss [ ]$ ; Anorexia [ ]$ ; Fatigue [ ]$ ; Fever [ ]$ ; Chills [ ]$ ; Weakness [ ]$   Cardiac: Chest pain/pressure [ ]$ ; Resting SOB [ ]$ ; Exertional SOB [ Y]; Orthopnea [ ]$ ; Pedal Edema [ Y]; Palpitations [ ]$ ; Syncope [ ]$ ; Presyncope [ ]$ ; Paroxysmal nocturnal dyspnea[ ]$   Pulmonary: Cough [ ]$ ; Wheezing[ ]$ ; Hemoptysis[ ]$ ; Sputum [ ]$ ; Snoring [ ]$   GI: Vomiting[ ]$ ; Dysphagia[ ]$ ; Melena[ ]$ ; Hematochezia [ ]$ ; Heartburn[ ]$ ; Abdominal pain [ ]$ ; Constipation [ ]$ ; Diarrhea [ ]$ ; BRBPR [ ]$   GU: Hematuria[ ]$ ; Dysuria [ ]$ ; Nocturia[ ]$   Vascular: Pain in legs with walking [ ]$ ; Pain in feet with lying flat [ ]$ ; Non-healing sores [ ]$ ; Stroke [ ]$ ; TIA [ ]$ ; Slurred speech [ ]$ ;   Neuro: Headaches[ ]$ ; Vertigo[ ]$ ; Seizures[ ]$ ; Paresthesias[ ]$ ;Blurred vision [ ]$ ; Diplopia [ ]$ ; Vision changes [ ]$   Ortho/Skin: Arthritis [ ]$ ; Joint pain [ Y]; Muscle pain [ ]$ ; Joint swelling [ ]$ ; Back Pain [ ]$ ; Rash [ ]$   Psych: Depression[ ]$ ; Anxiety[ ]$   Heme: Bleeding problems [ ]$ ; Clotting disorders [ ]$ ; Anemia [ ]$   Endocrine: Diabetes [Y ]; Thyroid dysfunction[Y ]  Home Medications Prior to Admission medications   Medication Sig Start Date End Date Taking? Authorizing Provider  acetaminophen (TYLENOL) 500 MG tablet Take 500 mg by mouth every 6 (six) hours as needed for mild pain.   Yes [provider]  brimonidine-timolol (COMBIGAN) 0.2-0.5 % ophthalmic solution Place 1 drop into both eyes every 12 (twelve) hours.   Yes [provider]  carvedilol (COREG) 25 MG tablet Take 1 tablet (25 mg total) by mouth 2 (two) times daily. Patient taking differently: Take 25 mg by mouth 2 (two) times daily with a meal. 08/25/20  Yes Skeet Latch, MD  HUMALOG KWIKPEN 200 UNIT/ML KwikPen Inject 22 Units into the skin 3 (three) times daily. PLUS 4 extra units if blood sugar over 200 mg/dL 06/11/22  Yes [provider]  hydrALAZINE (APRESOLINE) 100 MG tablet Take 1 tablet (100 mg total) by mouth 3 (three) times daily. 11/15/22  Yes Skeet Latch, MD  Insulin Glargine [facility]) 100 UNIT/ML Inject 38 Units into the skin daily. 08/16/22  Yes [provider]  KERENDIA 20 MG TABS Take 1 tablet by mouth every morning. 12/22/21  Yes [provider]  levothyroxine (SYNTHROID) 137 MCG tablet Take 137 mcg by mouth every morning. 01/08/22  Yes [provider]  Potassium Chloride ER 20 MEQ TBCR TAKE 2 TABLETS TWICE A DAY Patient taking differently: Take 40 mEq by mouth 2 (two) times daily. 04/18/22  Yes Bensimhon, Shaune Pascal, MD  simvastatin (ZOCOR) 20 MG tablet Take 20 mg by mouth every evening.   Yes [provider]  telmisartan (MICARDIS) 80 MG  tablet TAKE 1 TABLET (80 MG TOTAL) BY MOUTH IN THE MORNING AND AT BEDTIME. Patient taking differently: Take 80 mg by mouth in the morning and at bedtime. 04/02/22  Yes Skeet Latch, MD  torsemide (DEMADEX) 20 MG tablet TAKE 40 MG BY MOUTH DAILY. Patient taking differently: Take 20 mg by mouth daily. 09/21/22  Yes O'Neal, Cassie Freer, MD  Blood Glucose Monitoring Suppl (ONE TOUCH ULTRA SYSTEM KIT) W/DEVICE KIT 1 kit by Does not apply route once. One touch ultrasoft lancets    [provider]  FARXIGA 10 MG TABS tablet Take 10 mg by mouth daily. Patient not taking: Reported on 01/08/2023 01/21/21   [provider]  TRULICITY 4.5 0000000 SOPN SMARTSIG:4.5 Milligram(s) SUB-Q Every 4 Weeks Patient not taking: Reported on 01/08/2023 07/20/22   [provider]    Past Medical History: Past Medical History:  Diagnosis Date   Acanthosis nigricans    Atopic dermatitis    Diabetes ([place]) 2004   Erectile dysfunction    Fatty liver 2007   Glaucoma 2008   Gynecomastia 2009   Hyperaldosteronism ([place]) 2000   Hypercholesterolemia    Hyperlipidemia    Hypertension    Hypoglycemic reaction    Hypothyroidism 2004   Incontinence    Intermittent vertigo 2012   Left cervical radiculopathy 2012   Lumbar radiculopathy    Obesity    Peripheral neuropathy    Pollen allergies 2007   perennial   Prostate cancer ([place]) 2006   Reflux esophagitis 1995   Sickle cell trait ([place]) 2006   Sleep apnea, obstructive 2000   uses a cpap   Venous insufficiency 2005   Vitamin D deficiency 2012    Past Surgical History: Past Surgical History:  Procedure Laterality Date   COLONOSCOPY  2006   normal   lap band surgery  2009   left inguinal hernia repair  1998   robotic prostatectomy  2008    Family History: Family History  Problem Relation Age of Onset   Hypertension Father        deceased age 39   Heart attack Father    Heart failure Mother    COPD Sister    CVA Sister     Diabetes Daughter    Colon cancer Neg Hx    Stomach cancer Neg Hx     Social History: Social History   Socioeconomic History   Marital status: Married    Spouse name: Not on file   Number of children: 3   Years of education: Not on file   Highest education level: Not on file  Occupational History   Occupation: truck driver    Comment: Retired Economist  Tobacco Use   Smoking status: Never   Smokeless tobacco: Never  Vaping Use   Vaping Use: Never used  Substance and Sexual Activity   Alcohol use: Yes    Comment: one drink every few months   Drug use: No   Sexual activity: Not on file  Other Topics Concern   Not on file  Social History Narrative   Right handed   Lives in a two story home    Drinks no caffeine    Social Determinants of Health   Financial Resource Strain: Not on file  Food Insecurity: Not on file  Transportation Needs: Not on file  Physical Activity: Not on file  Stress: Not on file  Social Connections: Not on file    Allergies:  Allergies  Allergen Reactions   Ace Inhibitors Cough   Maxidex [Dexamethasone] Other (See Comments)    Sexual dysfunction   Verapamil Other (See Comments)    constipation    Objective:    Vital Signs:   Temp:  [97.4 F (36.3 C)-98.9 F (37.2 C)] 98 F (36.7 C) (02/20 0754) Pulse Rate:  [53-63] 57 (02/20 0754) Resp:  [16-20] 16 (02/20 0414) BP: (163-207)/(69-107) 165/78 (02/20 0754) SpO2:  [95 %-100 %] 97 % (02/20 0754) Weight:  [140.9 kg-142.9 kg] 140.9 kg (02/20 0300)    Weight change: Filed Weights   01/07/23 1913 01/08/23 0300  Weight: (!) 142.9 kg (!) 140.9 kg    Intake/Output:   Intake/Output Summary (Last 24 hours) at 01/08/2023 0924 Last data filed at 01/08/2023 0756 Gross per 24 hour  Intake 100 ml  Output 1775 ml  Net -1675 ml      Physical Exam    General: In bed.  No resp difficulty HEENT: normal Neck: supple. JVP 9-10  Carotids 2+ bilat; no bruits. No lymphadenopathy or  thyromegaly appreciated. Cor: PMI nondisplaced. Regular rate & rhythm. No rubs, gallops or murmurs. Lungs: clear Abdomen: soft, nontender, distended. No hepatosplenomegaly. No bruits or masses. Good bowel sounds. Extremities: no cyanosis, clubbing, rash, R and LLE 1+ edema Neuro: alert & orientedx3, cranial nerves grossly intact. moves all 4 extremities w/o difficulty. Affect pleasant   Telemetry   SR 60s   EKG   SR 63 bpm    Labs   Basic Metabolic Panel: Recent Labs  Lab 01/07/23 1918  NA 138  K 3.7  CL 103  CO2 26  GLUCOSE 229*  BUN 26*  CREATININE 2.43*  CALCIUM 8.5*    Liver Function Tests: No results for input(s): "AST", "ALT", "ALKPHOS", "BILITOT", "PROT", "ALBUMIN" in the last 168 hours. No results for input(s): "LIPASE", "AMYLASE" in the last 168 hours. No results for input(s): "AMMONIA" in the last 168 hours.  CBC: Recent Labs  Lab 01/07/23 1918  WBC 14.5*  HGB 10.8*  HCT 32.3*  MCV 81.6  PLT 376    Cardiac Enzymes: No results for input(s): "CKTOTAL", "CKMB", "CKMBINDEX", "TROPONINI" in the last 168 hours.  BNP: BNP (last 3 results) Recent Labs    01/07/23 1918  BNP 722.9*    ProBNP (last 3 results) No results for input(s): "PROBNP" in the last 8760 hours.   CBG: Recent Labs  Lab 01/08/23 0753  GLUCAP 185*    Coagulation Studies: No results for input(s): "LABPROT", "INR" in the last 72 hours.   Imaging   DG Chest 2 View  Result Date: 01/07/2023 CLINICAL DATA:  Shortness of breath. EXAM: CHEST - 2 VIEW COMPARISON:  Chest radiograph dated 01/07/2023. FINDINGS: There  is mild cardiomegaly with mild vascular congestion. There are bibasilar atelectasis/scarring. No focal consolidation, pleural effusion or pneumothorax. No acute osseous pathology. IMPRESSION: Mild cardiomegaly with mild vascular congestion. No focal consolidation. Electronically Signed   By: Anner Crete M.D.   On: 01/07/2023 20:19     Medications:     Current  Medications:  brimonidine  1 drop Both Eyes Q12H   And   timolol  1 drop Both Eyes Q12H   carvedilol  25 mg Oral BID WC   dapagliflozin propanediol  10 mg Oral Daily   enoxaparin (LOVENOX) injection  40 mg Subcutaneous Q24H   Finerenone  1 tablet Oral q morning   furosemide  40 mg Intravenous Daily   hydrALAZINE  100 mg Oral TID   insulin aspart  0-15 Units Subcutaneous TID WC   insulin aspart  0-5 Units Subcutaneous QHS   insulin aspart  4 Units Subcutaneous TID WC   insulin glargine-yfgn  38 Units Subcutaneous Daily   levothyroxine  137 mcg Oral Q0600   nitroGLYCERIN  1 inch Topical Q6H   simvastatin  20 mg Oral QPM   sodium chloride flush  3 mL Intravenous Q12H    Infusions:  sodium chloride        Patient Profile    Mr Holstad is a 67 year old with a history of obesity, DMII, Hyperthyroidism, CKD Stage IIIb, HTN, OSA on CPAP, and HFpEF.   Admitted with A/C HFpEF.   Assessment/Plan   1. A/C HFpEF  Echo 2023 EF preserved Grade 1 DD.  Admitted with volume overload. Suspect volume overloaded in the setting of liberalized diet. BNP 722.  Screen for FASTR trial.  - Volume status elevated. Needs additional diuresis. Continue IV lasix.  - Continue farxiga ? On Kerendia at home? He has not brought medication  from home.  -- Low sodium diet and limit fluids to < 2 liters per day. Consult dietitian.  - BMET pending.   2. HTN   -Elevated. Continue coreg 25 mg twice a day - Continue hydralazine 100 mg three times a day  - Add 5 mg amlodipine.  - Hold off on ARB consider ARNi   3. CKD Stage IV -Creatinine baseline 2.4-2.6 . GFR 28  - On farxiga 10 mg daily  -BMET pending.  -Followed in the community by Dr Joelyn Oms   4. OSA -Continue nightly CPAP   5. DMII -On SSI  -Hgb A1C 8.6   6. Obesity  Body mass index is 39.88 kg/m. Consult dietitian.   BMET ordered.   Length of Stay: 0  Darrick Grinder, NP  01/08/2023, 9:24 AM  Advanced Heart Failure Team Pager [phone]  (M-F; 7a - 5p)  Please contact [facility] for night-coverage after hours (4p -7a ) and weekends on [email or web address]  Patient seen with NP, agree with the above note.   Patient reports 30 lb weight gain over the last month + dyspnea.  Creatinine 2.5.  He has been taking torsemide 20 mg daily at home.   General: NAD Neck: JVP 12 cm with HJR,  no thyromegaly or thyroid nodule.  Lungs: Clear to auscultation bilaterally with normal respiratory effort. CV: Nondisplaced PMI.  Heart regular S1/S2, no S3/S4, no murmur.  1+ edema to knees.  No carotid bruit.  Normal pedal pulses.  Abdomen: Soft, nontender, no hepatosplenomegaly, no distention.  Skin: Intact without lesions or rashes.  Neurologic: Alert and oriented x 3.  Psych: Normal affect. Extremities: No clubbing or cyanosis.  HEENT:  Normal.   1. Chronic diastolic CHF: Echo in [id number] with EF 60-65%, mild LVH, normal RV.  Patient reports 30 lb weight gain, he is volume overloaded on exam.  NYHA class IIIb.  Volume management complicated by cardiorenal syndrome, creatinine 2.5 today.  - Continue dapagliflozin 10 - Continue finerenone - We discussed FASTR trial, I think patient would be good candidate.  He is going to discuss with his wife.  If he does not go into the FASTR trial, will start Lasix 80 mg IV bid.  - Will screen for TTR cardiac amyloidosis with PYP scan.  2. CKD stage IV: Follow creatinine closely with diuresis.  Followed by nephrology as outpatient.  3. HTN: BP elevated, adding amlodipine 5 mg daily to regimen.  4. OSA: Use CPAP.   Loralie Champagne 01/08/2023 11:32 AM

## 2023-01-08 NOTE — Progress Notes (Signed)
Hypoglycemic Event  CBG: 65  Treatment: 8 oz juice/soda  Symptoms: Shaky  Follow-up CBG: Time:1712 CBG Result:125  Possible Reasons for Event: Unknown, pt given juice and dinner tray      [employer]

## 2023-01-08 NOTE — Progress Notes (Deleted)
Cardiology Clinic Note   Patient Name: Brendan [name]. Date of Encounter: 01/08/2023  Primary Care Provider:  Seward Carol, MD Primary Cardiologist:  None  Patient Profile      Past Medical History    Past Medical History:  Diagnosis Date   Acanthosis nigricans    Atopic dermatitis    Diabetes ([place]) 2004   Erectile dysfunction    Fatty liver 2007   Glaucoma 2008   Gynecomastia 2009   Hyperaldosteronism ([place]) 2000   Hypercholesterolemia    Hyperlipidemia    Hypertension    Hypoglycemic reaction    Hypothyroidism 2004   Incontinence    Intermittent vertigo 2012   Left cervical radiculopathy 2012   Lumbar radiculopathy    Obesity    Peripheral neuropathy    Pollen allergies 2007   perennial   Prostate cancer ([place]) 2006   Reflux esophagitis 1995   Sickle cell trait ([place]) 2006   Sleep apnea, obstructive 2000   uses a cpap   Venous insufficiency 2005   Vitamin D deficiency 2012   Past Surgical History:  Procedure Laterality Date   COLONOSCOPY  2006   normal   lap band surgery  2009   left inguinal hernia repair  1998   robotic prostatectomy  2008    Allergies  Allergies  Allergen Reactions   Ace Inhibitors Cough   Maxidex [Dexamethasone] Other (See Comments)    Sexual dysfunction   Verapamil Other (See Comments)    constipation    History of Present Illness    ***  Home Medications    No current facility-administered medications for this visit.   No current outpatient medications on file.   Facility-Administered Medications Ordered in Other Visits  Medication Dose Route Frequency Provider Last Rate Last Admin   0.9 %  sodium chloride infusion  250 mL Intravenous PRN Etta Quill, DO       acetaminophen (TYLENOL) tablet 650 mg  650 mg Oral Q4H PRN Etta Quill, DO       amLODipine (NORVASC) tablet 5 mg  5 mg Oral Daily Clegg, Amy D, NP       brimonidine (ALPHAGAN) 0.2 % ophthalmic solution 1 drop  1 drop Both Eyes Q12H Etta Quill, DO   1 drop at 01/08/23 [id number]   And   timolol (TIMOPTIC) 0.5 % ophthalmic solution 1 drop  1 drop Both Eyes Q12H Etta Quill, DO   1 drop at 01/08/23 0908   carvedilol (COREG) tablet 25 mg  25 mg Oral BID WC Etta Quill, DO   25 mg at 01/08/23 [id number]   dapagliflozin propanediol (FARXIGA) tablet 10 mg  10 mg Oral Daily Jennette Kettle M, DO   10 mg at 01/08/23 0905   enoxaparin (LOVENOX) injection 40 mg  40 mg Subcutaneous Q24H Jennette Kettle M, DO   40 mg at 01/08/23 [id number]   Finerenone TABS 20 mg  1 tablet Oral q morning Etta Quill, DO       hydrALAZINE (APRESOLINE) tablet 100 mg  100 mg Oral TID Etta Quill, DO   100 mg at 01/08/23 [id number]   insulin aspart (novoLOG) injection 0-15 Units  0-15 Units Subcutaneous TID WC Etta Quill, DO   3 Units at 01/08/23 [id number]   insulin aspart (novoLOG) injection 0-5 Units  0-5 Units Subcutaneous QHS Jennette Kettle M, DO       insulin aspart (novoLOG) injection 4 Units  4 Units Subcutaneous TID WC  Etta Quill, DO   4 Units at 01/08/23 [id number]   insulin glargine-yfgn [facility]) injection 38 Units  38 Units Subcutaneous Daily Etta Quill, DO   38 Units at 01/08/23 [id number]   levothyroxine (SYNTHROID) tablet 137 mcg  137 mcg Oral Q0600 Etta Quill, DO   137 mcg at 01/08/23 [id number]   ondansetron (ZOFRAN) injection 4 mg  4 mg Intravenous Q6H PRN Etta Quill, DO       simvastatin (ZOCOR) tablet 20 mg  20 mg Oral QPM Jennette Kettle M, DO       sodium chloride flush (NS) 0.9 % injection 3 mL  3 mL Intravenous Q12H Jennette Kettle M, DO   3 mL at 01/08/23 [id number]   sodium chloride flush (NS) 0.9 % injection 3 mL  3 mL Intravenous PRN Etta Quill, DO         Family History    Family History  Problem Relation Age of Onset   Hypertension Father        deceased age 31   Heart attack Father    Heart failure Mother    COPD Sister    CVA Sister    Diabetes Daughter    Colon cancer Neg Hx    Stomach cancer Neg Hx    He indicated that  his mother is alive. He indicated that his father is deceased. He indicated that the status of his daughter is unknown. He indicated that the status of his neg hx is unknown.  Social History    Social History   Socioeconomic History   Marital status: Married    Spouse name: Not on file   Number of children: 3   Years of education: Not on file   Highest education level: Not on file  Occupational History   Occupation: truck driver    Comment: Retired Economist  Tobacco Use   Smoking status: Never   Smokeless tobacco: Never  Vaping Use   Vaping Use: Never used  Substance and Sexual Activity   Alcohol use: Yes    Comment: one drink every few months   Drug use: No   Sexual activity: Not on file  Other Topics Concern   Not on file  Social History Narrative   Right handed   Lives in a two story home    Drinks no caffeine    Social Determinants of Health   Financial Resource Strain: Not on file  Food Insecurity: Not on file  Transportation Needs: Not on file  Physical Activity: Not on file  Stress: Not on file  Social Connections: Not on file  Intimate Partner Violence: Not on file     Review of Systems    General:  No chills, fever, night sweats or weight changes.  Cardiovascular:  No chest pain, dyspnea on exertion, edema, orthopnea, palpitations, paroxysmal nocturnal dyspnea. Dermatological: No rash, lesions/masses Respiratory: No cough, dyspnea Urologic: No hematuria, dysuria Abdominal:   No nausea, vomiting, diarrhea, bright red blood per rectum, melena, or hematemesis Neurologic:  No visual changes, wkns, changes in mental status. All other systems reviewed and are otherwise negative except as noted above.     Physical Exam    VS:  There were no vitals taken for this visit. , BMI There is no height or weight on file to calculate BMI.     GEN: Well nourished, well developed, in no acute distress. HEENT: normal. Neck: Supple, no JVD, carotid bruits, or  masses. Cardiac: RRR, no  murmurs, rubs, or gallops. No clubbing, cyanosis, edema.  Radials/DP/PT 2+ and equal bilaterally.  Respiratory:  Respirations regular and unlabored, clear to auscultation bilaterally. GI: Soft, nontender, nondistended, BS + x 4. MS: no deformity or atrophy. Skin: warm and dry, no rash. Neuro:  Strength and sensation are intact. Psych: Normal affect.  Accessory Clinical Findings    ECG personally reviewed by me today- *** - No acute changes  Lab Results  Component Value Date   WBC 14.5 (H) 01/07/2023   HGB 10.8 (L) 01/07/2023   HCT 32.3 (L) 01/07/2023   MCV 81.6 01/07/2023   PLT 376 01/07/2023   Lab Results  Component Value Date   CREATININE 2.43 (H) 01/08/2023   BUN 29 (H) 01/08/2023   NA 136 01/08/2023   K 3.3 (L) 01/08/2023   CL 100 01/08/2023   CO2 26 01/08/2023   Lab Results  Component Value Date   ALT 13 04/21/2022   AST 14 (L) 04/21/2022   ALKPHOS 74 04/21/2022   BILITOT 0.8 04/21/2022   No results found for: "CHOL", "HDL", "LDLCALC", "LDLDIRECT", "TRIG", "CHOLHDL"  Lab Results  Component Value Date   HGBA1C 8.6 (H) 01/08/2023    Review of Prior Studies:   Assessment & Plan   1.  ***    Current medicines are reviewed at length with the patient today.  I have spent *** min's  dedicated to the care of this patient on the date of this encounter to include pre-visit review of records, assessment, management and diagnostic testing,with shared decision making. Signed, Phill Myron. West Pugh, ANP, AACC   01/08/2023 12:22 PM      Office [phone] Fax [phone]  Notice: This dictation was prepared with Dragon dictation along with smaller phrase technology. Any transcriptional errors that result from this process are unintentional and may not be corrected upon review.

## 2023-01-08 NOTE — Progress Notes (Addendum)
Patient seen and examined, admitted earlier this morning by Dr. Alcario Drought please see his H&P for details briefly 66/M with history of diastolic CHF, morbid obesity, CKD 4, type 2 diabetes mellitus, OSA presented to the ED with progressive shortness of breath, weight gain of approximately 27 pounds, in the ED chest x-ray with vascular congestion, BNP 722, troponin 24, creatinine 2.4  Acute on chronic diastolic CHF Last echo [id number] with EF of 123456, grade 1 diastolic dysfunction, normal RV -He is approximately 30 pounds over his usual weight -Continue IV Lasix, being evaluated for FASTR trial -Continue Farxiga, finerenone, discontinue MRA if GFR continues to worsen -CHF team following -Dietitian consult  Morbid obesity OSA/OHS -Needs to lose weight, continue CPAP  CKD 4 -Relatively stable creatinine then, monitor, followed by Dr. Joelyn Oms as outpatient  Hypertension -Continue hydralazine, Coreg, amlodipine  Hypothyroidism -Continue Synthroid  Insulin-dependent type 2 diabetes -Continue glargine, Farxiga, Trulicity, meal coverage insulin -A1c is 8.6  Hypokalemia -Replace  Domenic Polite, MD

## 2023-01-08 NOTE — ED Notes (Signed)
Patient notes and SBAR reviewed

## 2023-01-08 NOTE — TOC Initial Note (Addendum)
Transition of Care (TOC) - Initial/Assessment Note    Patient Details  Name: Brendan [name]. MRN: [id number] Date of Birth: 09/08/1956  Transition of Care [facility]) CM/SW Contact:    Erenest Rasher, RN Phone Number: [phone] 01/08/2023, 4:57 PM  Clinical Narrative:                 Spoke to pt at bedside. Pt was independent PTA. Pt reports he does not do daily weight. Educated on the importance of daily weights and low sodium diet. Drives to his appts. Will continue to follow for dc needs.   Spoke to pt's wife and states his Drug coverage will begin in March. She recently retired and he had coverage under her plan. Their supplement plan was scheduled to start this month but their were issues. States he was able to get all his medications except [place] and Trulicity. Explained Wilder Glade has a 30 day free trial card.     Expected Discharge Plan: Home/Self Care Barriers to Discharge: Continued Medical Work up   Patient Goals and CMS Choice Patient states their goals for this hospitalization and ongoing recovery are:: wants to remain independent          Expected Discharge Plan and Services   Discharge Planning Services: CM Consult   Living arrangements for the past 2 months: Single Family Home                                      Prior Living Arrangements/Services Living arrangements for the past 2 months: Single Family Home Lives with:: Spouse Patient language and need for interpreter reviewed:: Yes Do you feel safe going back to the place where you live?: Yes      Need for Family Participation in Patient Care: No (Comment) Care giver support system in place?: Yes (comment) Current home services: DME (CPAP, scale) Criminal Activity/Legal Involvement Pertinent to Current Situation/Hospitalization: No - Comment as needed  Activities of Daily Living Home Assistive Devices/Equipment: CPAP, Other (Comment) (Grab bars for stairs) ADL Screening (condition at time  of admission) Patient's cognitive ability adequate to safely complete daily activities?: Yes Is the patient deaf or have difficulty hearing?: No Does the patient have difficulty seeing, even when wearing glasses/contacts?: No Does the patient have difficulty concentrating, remembering, or making decisions?: Yes (memory) Patient able to express need for assistance with ADLs?: Yes Does the patient have difficulty dressing or bathing?: No Independently performs ADLs?: Yes (appropriate for developmental age) Does the patient have difficulty walking or climbing stairs?: Yes Weakness of Legs: Both Weakness of Arms/Hands: Both  Permission Sought/Granted Permission sought to share information with : Case Manager, Family Supports, PCP Permission granted to share information with : Yes, Verbal Permission Granted  Share Information with NAME: Fredreick Satterwhite     Permission granted to share info w Relationship: wife  Permission granted to share info w Contact Information: [phone]  Emotional Assessment Appearance:: Appears stated age Attitude/Demeanor/Rapport: Engaged Affect (typically observed): Accepting Orientation: : Oriented to Self, Oriented to Place, Oriented to  Time, Oriented to Situation   Psych Involvement: No (comment)  Admission diagnosis:  Acute on chronic diastolic CHF (congestive heart failure) (HCC) [I50.33] Acute on chronic congestive heart failure, unspecified heart failure type ([place]) [I50.9] Patient Active Problem List   Diagnosis Date Noted   CKD (chronic kidney disease) stage 4, GFR 15-29 ml/min (HCC) 01/08/2023   Heart failure, systolic,  acute ([place]) 03/26/2020   Acute on chronic diastolic CHF (congestive heart failure) ([place]) 03/25/2020   Hypokalemia 03/25/2020   Leukocytosis 03/25/2020   Renal insufficiency 03/25/2020   Chest pain 03/25/2020   Hypothyroidism 03/25/2020   Gout attack 03/25/2020   Morbid obesity ([place]) 10/28/2015   Internal hemorrhoids with bleeding  07/31/2013   Insulin-requiring or dependent type II diabetes mellitus ([place]) 01/11/2010   Obstructive sleep apnea 01/11/2010   Hypertension 01/11/2010   GLAUCOMA 01/10/2010   Allergic rhinitis 01/10/2010   PROSTATE CANCER, HX OF 01/10/2010   PCP:  Seward Carol, MD Pharmacy:   [id number] [id number] [id number]: [phone]: [phone]    Social Determinants of Health (SDOH) Social History: SDOH Screenings   Tobacco Use: Low Risk  (01/07/2023)   SDOH Interventions:     Readmission Risk Interventions     No data to display

## 2023-01-08 NOTE — Progress Notes (Signed)
Pt places self on/off own CPAP home unit.

## 2023-01-08 NOTE — Assessment & Plan Note (Addendum)
Echocardiogram with preserved LV systolic function EF 55 to 60%, moderate dilatation of LV, mild LVH, RV systolic function preserved, LA with moderate dilatation.   Patient was placed on IV furosemide for diuresis, negative fluid balance was achieved, -14,395 ml, with significant improvement in his symptoms.   Continue carvedilol and empagliflozin  Finerenone and entresto.  After load reduction with hydralazine  Loop diuretic therapy with torsemide with close follow up as outpatient.  PYP scan as outpatient.

## 2023-01-08 NOTE — Research (Signed)
      FASTR Informed Consent   Subject Name: Brendan [name].  Subject met inclusion and exclusion criteria.  The informed consent form, study requirements and expectations were reviewed with the subject and questions and concerns were addressed prior to the signing of the consent form.  The subject verbalized understanding of the trial requirements.  The subject agreed to participate in the FASTR trial and signed the informed consent at 1315 on 01/08/2023.  The informed consent was obtained prior to performance of any protocol-specific procedures for the subject.  A copy of the signed informed consent was given to the subject and a copy was placed in the subject's medical record.     SITE #   001             SUBJECT #:  10           SUBJECT INITIALS: FY  RANDOMIZATION:  Does the subject meet all the study criteria?   [x]$  YES  []$  NO  Is the patient suitable to receive diuretic therapy per the high dose arm of the DOSE-AHF trial? (IV loop diuretic at 2.5 times the home dose of oral loop diuretic, Given by continuous infusion or bolus, on a mg per mg basis)     [x]$  YES  []$   NO      If NO, Provide reason: ____      If YES, Proposed Type and Dosage if Subject is Randomized to OMT:          [x]$  Furosemide _160_mg  []$  Torsemide ______mg  []$  Bumetanide ______mg    Date / Time of Randomization:   01/08/2023    13:45        []$   Time Unknown             RANDOMIZATION: (Generated by the system)    []$   Reprieve DMS  [x]$   ODT

## 2023-01-08 NOTE — Progress Notes (Addendum)
Heart Failure Navigator Progress Note  Assessed for Heart & Vascular TOC clinic readiness.  Advanced heart failure team is consulted.   Kerby Nora, PharmD, BCPS Heart Failure Stewardship Pharmacist Phone [phone]

## 2023-01-08 NOTE — ED Provider Notes (Signed)
[facility] Provider Note   CSN: [id number] Arrival date & time: 01/07/23  1853     History  Chief Complaint  Patient presents with   Shortness of Breath    Brendan [name]. is a 67 y.o. male.  The history is provided by the patient and medical records.  Shortness of Breath  67 year old male with history of diabetes, peripheral neuropathy, sleep apnea, vitamin D deficiency, hypothyroidism, hypertension, hyperlipidemia, CHF (EF 60-65%), presenting to the ED with shortness of breath.  Symptoms have been progressively worsening over the past 3 weeks.  States initially shortness of breath mostly with exertion, now feels short of breath all the time even at rest.  States just getting up to walk to the bathroom in his house he has to stop and take deep breaths a few times which is abnormal for him.  He does have nighttime orthopnea and increased lower extremity edema.  He is weight is also up 30 pounds compared to baseline.  He was seen by PCP today and sent in due to abnormal labs and CHF exacerbation.  He has not had any cough or fever.  He did miss his nighttime blood pressure medications as he was in the waiting room.  No active chest pain.  Home Medications Prior to Admission medications   Medication Sig Start Date End Date Taking? Authorizing Provider  acetaminophen (TYLENOL) 500 MG tablet Take 500 mg by mouth every 6 (six) hours as needed for mild pain.   Yes [provider]  brimonidine-timolol (COMBIGAN) 0.2-0.5 % ophthalmic solution Place 1 drop into both eyes every 12 (twelve) hours.   Yes [provider]  carvedilol (COREG) 25 MG tablet Take 1 tablet (25 mg total) by mouth 2 (two) times daily. Patient taking differently: Take 25 mg by mouth 2 (two) times daily with a meal. 08/25/20  Yes Skeet Latch, MD  Blood Glucose Monitoring Suppl (ONE TOUCH ULTRA SYSTEM KIT) W/DEVICE KIT 1 kit by Does not apply route once. One  touch ultrasoft lancets    [provider]  FARXIGA 10 MG TABS tablet Take 10 mg by mouth daily. 01/21/21   [provider]  Febuxostat 80 MG TABS Take 80 mg by mouth daily. 02/09/21   [provider]  hydrALAZINE (APRESOLINE) 100 MG tablet Take 1 tablet (100 mg total) by mouth 3 (three) times daily. 11/15/22   Skeet Latch, MD  Insulin Glargine [facility]) 100 UNIT/ML Inject into the skin. 08/16/22   [provider]  Insulin Lispro Prot & Lispro (HUMALOG MIX 75/25 KWIKPEN) (75-25) 100 UNIT/ML Kwikpen 60 UNITS AT BEGINNING OF BREAKFAST & 50 UNITS AT BEGINNING OF EVENING MEAL SUBCUTANEOUS TWICE A DAY Subcutaneous for 30 Patient not taking: Reported on 09/07/2022    [provider]  KERENDIA 20 MG TABS Take 1 tablet by mouth every morning. 12/22/21   [provider]  levothyroxine (SYNTHROID) 137 MCG tablet Take 137 mcg by mouth every morning. 01/08/22   [provider]  Potassium Chloride ER 20 MEQ TBCR TAKE 2 TABLETS TWICE A DAY 04/18/22   Bensimhon, Shaune Pascal, MD  simvastatin (ZOCOR) 20 MG tablet Take 20 mg by mouth every evening.    [provider]  telmisartan (MICARDIS) 80 MG tablet TAKE 1 TABLET (80 MG TOTAL) BY MOUTH IN THE MORNING AND AT BEDTIME. 04/02/22   Skeet Latch, MD  torsemide (DEMADEX) 20 MG tablet Take 20 mg by mouth daily. Take 1 Tablet Daily  [provider]  torsemide (DEMADEX) 20 MG tablet TAKE 40 MG BY MOUTH DAILY. 09/21/22   O'NealCassie Freer, MD  TRULICITY 4.5 0000000 SOPN SMARTSIG:4.5 Milligram(s) SUB-Q Every 4 Weeks 07/20/22   [provider]      Allergies    Ace inhibitors, Maxidex [dexamethasone], and Verapamil    Review of Systems   Review of Systems  Respiratory:  Positive for shortness of breath.   Cardiovascular:  Positive for leg swelling.  All other systems reviewed and are negative.   Physical Exam Updated Vital Signs BP (!) 186/82   Pulse 62   Temp  98.9 F (37.2 C) (Oral)   Resp 19   Ht 6' 2"$  (1.88 m)   Wt (!) 142.9 kg   SpO2 99%   BMI 40.44 kg/m  Physical Exam Vitals and nursing note reviewed.  Constitutional:      Appearance: He is well-developed. He is obese.  HENT:     Head: Normocephalic and atraumatic.  Eyes:     Conjunctiva/sclera: Conjunctivae normal.     Pupils: Pupils are equal, round, and reactive to light.  Cardiovascular:     Rate and Rhythm: Normal rate and regular rhythm.     Heart sounds: Normal heart sounds.  Pulmonary:     Effort: Pulmonary effort is normal.     Breath sounds: Normal breath sounds.  Abdominal:     General: Bowel sounds are normal.     Palpations: Abdomen is soft.  Musculoskeletal:        General: Normal range of motion.     Cervical back: Normal range of motion.     Comments: 2+ pitting edema BLE, compression stockings in place  Skin:    General: Skin is warm and dry.  Neurological:     Mental Status: He is alert and oriented to person, place, and time.     ED Results / Procedures / Treatments   Labs (all labs ordered are listed, but only abnormal results are displayed) Labs Reviewed  BASIC METABOLIC PANEL - Abnormal; Notable for the following components:      Result Value   Glucose, Bld 229 (*)    BUN 26 (*)    Creatinine, Ser 2.43 (*)    Calcium 8.5 (*)    GFR, Estimated 28 (*)    All other components within normal limits  CBC - Abnormal; Notable for the following components:   WBC 14.5 (*)    RBC 3.96 (*)    Hemoglobin 10.8 (*)    HCT 32.3 (*)    All other components within normal limits  BRAIN NATRIURETIC PEPTIDE - Abnormal; Notable for the following components:   B Natriuretic Peptide 722.9 (*)    All other components within normal limits  TROPONIN I (HIGH SENSITIVITY) - Abnormal; Notable for the following components:   Troponin I (High Sensitivity) 24 (*)    All other components within normal limits  TROPONIN I (HIGH SENSITIVITY) - Abnormal; Notable for the  following components:   Troponin I (High Sensitivity) 24 (*)    All other components within normal limits    EKG None  Radiology DG Chest 2 View  Result Date: 01/07/2023 CLINICAL DATA:  Shortness of breath. EXAM: CHEST - 2 VIEW COMPARISON:  Chest radiograph dated 01/07/2023. FINDINGS: There is mild cardiomegaly with mild vascular congestion. There are bibasilar atelectasis/scarring. No focal consolidation, pleural effusion or pneumothorax. No acute osseous pathology. IMPRESSION: Mild cardiomegaly with mild vascular congestion. No focal consolidation. Electronically Signed  By: Anner Crete M.D.   On: 01/07/2023 20:19    Procedures Procedures    Medications Ordered in ED Medications  carvedilol (COREG) tablet 25 mg (has no administration in time range)  hydrALAZINE (APRESOLINE) tablet 100 mg (has no administration in time range)  irbesartan (AVAPRO) tablet 150 mg (has no administration in time range)  furosemide (LASIX) injection 40 mg (40 mg Intravenous Given 01/08/23 0051)  carvedilol (COREG) tablet 25 mg (25 mg Oral Given 01/08/23 0049)  hydrALAZINE (APRESOLINE) tablet 100 mg (100 mg Oral Given 01/08/23 0049)    ED Course/ Medical Decision Making/ A&P                             Medical Decision Making Amount and/or Complexity of Data Reviewed Labs: ordered. Radiology: ordered and independent interpretation performed. ECG/medicine tests: ordered and independent interpretation performed.  Risk Prescription drug management. Decision regarding hospitalization.   67 year old male presenting to the ED with 3 weeks of progressively worsening shortness of breath.  Seen at PCP office today and sent in for CHF exacerbation.  He does have nighttime orthopnea, increased lower extremity edema, and weight is up almost 30 pounds.  He has been compliant with his medications.  He is afebrile, nontoxic.  He is hypertensive on arrival to room, however has missed his nighttime blood  pressure medications as he was in the waiting room.  He does appear clinically fluid overloaded.  He is oxygenating well on room air.  EKG without any acute ischemic changes.  Labs as above--creatinine 2.43 which appears baseline.  Troponins minimally elevated and flat at 24 x2.  BNP 700+.  CXR with vascular congestion.  He was given night time BP meds, dose of IV lasix.  I do think he will require admission for diuresis.    Discussed with Dr. Alcario Drought-- will admit for ongoing care.  Final Clinical Impression(s) / ED Diagnoses Final diagnoses:  Acute on chronic congestive heart failure, unspecified heart failure type [facility])    Rx / DC Orders ED Discharge Orders     None         Larene Pickett, PA-C 01/08/23 0100    Quintella Reichert, MD 01/08/23 [phone]

## 2023-01-08 NOTE — Assessment & Plan Note (Addendum)
Continue synthroid.

## 2023-01-08 NOTE — Assessment & Plan Note (Addendum)
Continue blood pressure control with amlodipine, entresto, hydralazine, carvedilol and fenrenone.  Continue with oral loop diuretic

## 2023-01-08 NOTE — ED Notes (Signed)
ED TO INPATIENT HANDOFF REPORT  ED Nurse Name and Phone #:  Maren Wiesen [phone]  S Name/Age/Gender Brendan [name]. 67 y.o. male Room/Bed: 025C/025C  Code Status   Code Status: Full Code  Home/SNF/Other Home Patient oriented to: self, place, time, and situation Is this baseline? Yes   Triage Complete: Triage complete  Chief Complaint Acute on chronic diastolic CHF (congestive heart failure) ([place]) [I50.33]  Triage Note Says he was sent to ER by PCP after presenting with intermittent left lower chest pains and shob on exertion ongoing for 3 weeks.   Was notified of elevated troponin's but unsure of the value.   Hx CHF. Compliant on Lasix. Says it is hard to walk ~10 feet without trouble breathing or cp.    Allergies Allergies  Allergen Reactions   Ace Inhibitors Cough   Maxidex [Dexamethasone] Other (See Comments)    Sexual dysfunction   Verapamil Other (See Comments)    constipation    Level of Care/Admitting Diagnosis ED Disposition     ED Disposition  Admit   Condition  --   Comment  Hospital Area: [facility] [[id number]]  Level of Care: Telemetry Cardiac [[id number]]  May place patient in observation at [facility] or [facility] if equivalent level of care is available:: No  Covid Evaluation: Asymptomatic - no recent exposure (last 10 days) testing not required  Diagnosis: Acute on chronic diastolic CHF (congestive heart failure) [facility]) [id number]  Admitting Physician: Etta Quill [id number]  Attending Physician: Etta Quill [[id number]]          B Medical/Surgery History Past Medical History:  Diagnosis Date   Acanthosis nigricans    Atopic dermatitis    Diabetes ([place]) 2004   Erectile dysfunction    Fatty liver 2007   Glaucoma 2008   Gynecomastia 2009   Hyperaldosteronism ([place]) 2000   Hypercholesterolemia    Hyperlipidemia    Hypertension    Hypoglycemic reaction    Hypothyroidism 2004   Incontinence    Intermittent vertigo 2012    Left cervical radiculopathy 2012   Lumbar radiculopathy    Obesity    Peripheral neuropathy    Pollen allergies 2007   perennial   Prostate cancer ([place]) 2006   Reflux esophagitis 1995   Sickle cell trait ([place]) 2006   Sleep apnea, obstructive 2000   uses a cpap   Venous insufficiency 2005   Vitamin D deficiency 2012   Past Surgical History:  Procedure Laterality Date   COLONOSCOPY  2006   normal   lap band surgery  2009   left inguinal hernia repair  1998   robotic prostatectomy  2008     A IV Location/Drains/Wounds Patient Lines/Drains/Airways Status     Active Line/Drains/Airways     Name Placement date Placement time Site Days   Peripheral IV 01/08/23 20 G Right Antecubital 01/08/23  0040  Antecubital  less than 1            Intake/Output Last 24 hours No intake or output data in the 24 hours ending 01/08/23 0159  Labs/Imaging Results for orders placed or performed during the hospital encounter of 01/07/23 (from the past 48 hour(s))  Basic metabolic panel     Status: Abnormal   Collection Time: 01/07/23  7:18 PM  Result Value Ref Range   Sodium 138 135 - 145 mmol/L   Potassium 3.7 3.5 - 5.1 mmol/L   Chloride 103 98 - 111 mmol/L   CO2 26 22 -  32 mmol/L   Glucose, Bld 229 (H) 70 - 99 mg/dL    Comment: Glucose reference range applies only to samples taken after fasting for at least 8 hours.   BUN 26 (H) 8 - 23 mg/dL   Creatinine, Ser 2.43 (H) 0.61 - 1.24 mg/dL   Calcium 8.5 (L) 8.9 - 10.3 mg/dL   GFR, Estimated 28 (L) >60 mL/min    Comment: (NOTE) Calculated using the CKD-EPI Creatinine Equation (2021)    Anion gap 9 5 - 15    Comment: Performed at [facility] [address], [place], [place] [address]  CBC     Status: Abnormal   Collection Time: 01/07/23  7:18 PM  Result Value Ref Range   WBC 14.5 (H) 4.0 - 10.5 K/uL   RBC 3.96 (L) 4.22 - 5.81 MIL/uL   Hemoglobin 10.8 (L) 13.0 - 17.0 g/dL   HCT 32.3 (L) 39.0 - 52.0 %   MCV 81.6 80.0 - 100.0  fL   MCH 27.3 26.0 - 34.0 pg   MCHC 33.4 30.0 - 36.0 g/dL   RDW 14.5 11.5 - 15.5 %   Platelets 376 150 - 400 K/uL   nRBC 0.0 0.0 - 0.2 %    Comment: Performed at [facility], [place] [address], [place], [place] [address]  Troponin I (High Sensitivity)     Status: Abnormal   Collection Time: 01/07/23  7:18 PM  Result Value Ref Range   Troponin I (High Sensitivity) 24 (H) <18 ng/L    Comment: (NOTE) Elevated high sensitivity troponin I (hsTnI) values and significant  changes across serial measurements may suggest ACS but many other  chronic and acute conditions are known to elevate hsTnI results.  Refer to the "Links" section for chest pain algorithms and additional  guidance. Performed at [facility], [place] [address], [place], [place] [address]   Brain natriuretic peptide     Status: Abnormal   Collection Time: 01/07/23  7:18 PM  Result Value Ref Range   B Natriuretic Peptide 722.9 (H) 0.0 - 100.0 pg/mL    Comment: Performed at [facility] [address] [place], [place] [address]  Troponin I (High Sensitivity)     Status: Abnormal   Collection Time: 01/07/23 11:09 PM  Result Value Ref Range   Troponin I (High Sensitivity) 24 (H) <18 ng/L    Comment: (NOTE) Elevated high sensitivity troponin I (hsTnI) values and significant  changes across serial measurements may suggest ACS but many other  chronic and acute conditions are known to elevate hsTnI results.  Refer to the "Links" section for chest pain algorithms and additional  guidance. Performed at [facility], [place] [address] [place],  [address]    DG Chest 2 View  Result Date: 01/07/2023 CLINICAL DATA:  Shortness of breath. EXAM: CHEST - 2 VIEW COMPARISON:  Chest radiograph dated 01/07/2023. FINDINGS: There is mild cardiomegaly with mild vascular congestion. There are bibasilar atelectasis/scarring. No focal consolidation, pleural effusion or pneumothorax. No acute osseous pathology.  IMPRESSION: Mild cardiomegaly with mild vascular congestion. No focal consolidation. Electronically Signed   By: Anner Crete M.D.   On: 01/07/2023 20:19    Pending Labs Unresulted Labs (From admission, onward)     Start     Ordered   01/09/23 [id number]  Basic metabolic panel  Daily,   R     Comments: As Scheduled for 5 days    01/08/23 0101  01/08/23 0123  Hemoglobin A1c  Once,   R       Comments: To assess prior glycemic control    01/08/23 0122   01/08/23 0100  HIV Antibody (routine testing w rflx)  (HIV Antibody (Routine testing w reflex) panel)  Once,   R        01/08/23 0101            Vitals/Pain Today's Vitals   01/08/23 0040 01/08/23 0041 01/08/23 0100 01/08/23 0120  BP:   (!) 193/90 (!) 192/83  Pulse:   60 62  Resp:   18 19  Temp:  98.9 F (37.2 C)    TempSrc:  Oral    SpO2:   99% 100%  Weight:      Height:      PainSc: 0-No pain       Isolation Precautions No active isolations  Medications Medications  carvedilol (COREG) tablet 25 mg (has no administration in time range)  hydrALAZINE (APRESOLINE) tablet 100 mg (has no administration in time range)  irbesartan (AVAPRO) tablet 150 mg (has no administration in time range)  sodium chloride flush (NS) 0.9 % injection 3 mL (has no administration in time range)  sodium chloride flush (NS) 0.9 % injection 3 mL (has no administration in time range)  0.9 %  sodium chloride infusion (has no administration in time range)  acetaminophen (TYLENOL) tablet 650 mg (has no administration in time range)  ondansetron (ZOFRAN) injection 4 mg (has no administration in time range)  enoxaparin (LOVENOX) injection 40 mg (has no administration in time range)  furosemide (LASIX) injection 40 mg (has no administration in time range)  Finerenone TABS 20 mg (has no administration in time range)  levothyroxine (SYNTHROID) tablet 137 mcg (has no administration in time range)  simvastatin (ZOCOR) tablet 20 mg (has no administration  in time range)  insulin glargine-yfgn (SEMGLEE) injection 38 Units (has no administration in time range)  brimonidine-timolol (COMBIGAN) 0.2-0.5 % ophthalmic solution 1 drop (has no administration in time range)  insulin aspart (novoLOG) injection 0-15 Units (has no administration in time range)  insulin aspart (novoLOG) injection 4 Units (has no administration in time range)  insulin aspart (novoLOG) injection 0-5 Units (has no administration in time range)  Dulaglutide SOPN 4.5 mg (has no administration in time range)  dapagliflozin propanediol (FARXIGA) tablet 10 mg (has no administration in time range)  potassium chloride SA (KLOR-CON M) CR tablet 40 mEq (has no administration in time range)  furosemide (LASIX) injection 40 mg (40 mg Intravenous Given 01/08/23 0051)  carvedilol (COREG) tablet 25 mg (25 mg Oral Given 01/08/23 0049)  hydrALAZINE (APRESOLINE) tablet 100 mg (100 mg Oral Given 01/08/23 0049)    Mobility walks     Focused Assessments     R Recommendations: See Admitting Provider Note  Report given to:   Additional Notes:  a/o x4, continent x2, ambulatory.

## 2023-01-08 NOTE — Assessment & Plan Note (Addendum)
Hypokalemia. AKI.   Renal function with serum cr at 2.73 with K at 3,9 and serum bicarbonate at 22. Mg 2,2 and Na 136,  Base serum cr around 2,4,   Continue diuresis with oral torsemide, SGLT 2 inh and fenrenone.  Follow up renal function and electrolytes as outpatient next week.

## 2023-01-09 ENCOUNTER — Encounter: Payer: Medicare Other | Admitting: *Deleted

## 2023-01-09 DIAGNOSIS — Z006 Encounter for examination for normal comparison and control in clinical research program: Secondary | ICD-10-CM

## 2023-01-09 DIAGNOSIS — I509 Heart failure, unspecified: Secondary | ICD-10-CM

## 2023-01-09 DIAGNOSIS — I5033 Acute on chronic diastolic (congestive) heart failure: Secondary | ICD-10-CM | POA: Diagnosis not present

## 2023-01-09 LAB — GLUCOSE, CAPILLARY
Glucose-Capillary: 108 mg/dL — ABNORMAL HIGH (ref 70–99)
Glucose-Capillary: 158 mg/dL — ABNORMAL HIGH (ref 70–99)
Glucose-Capillary: 86 mg/dL (ref 70–99)
Glucose-Capillary: 107 mg/dL — ABNORMAL HIGH (ref 70–99)
Glucose-Capillary: 64 mg/dL — ABNORMAL LOW (ref 70–99)
Glucose-Capillary: 106 mg/dL — ABNORMAL HIGH (ref 70–99)
Glucose-Capillary: 165 mg/dL — ABNORMAL HIGH (ref 70–99)

## 2023-01-09 LAB — BASIC METABOLIC PANEL WITH GFR
Anion gap: 10 (ref 5–15)
BUN: 27 mg/dL — ABNORMAL HIGH (ref 8–23)
CO2: 26 mmol/L (ref 22–32)
Calcium: 8.4 mg/dL — ABNORMAL LOW (ref 8.9–10.3)
Chloride: 101 mmol/L (ref 98–111)
Creatinine, Ser: 2.36 mg/dL — ABNORMAL HIGH (ref 0.61–1.24)
GFR, Estimated: 29 mL/min — ABNORMAL LOW
Glucose, Bld: 152 mg/dL — ABNORMAL HIGH (ref 70–99)
Potassium: 2.9 mmol/L — ABNORMAL LOW (ref 3.5–5.1)
Sodium: 137 mmol/L (ref 135–145)

## 2023-01-09 LAB — BASIC METABOLIC PANEL
Anion gap: 9 (ref 5–15)
Anion gap: 8 (ref 5–15)
BUN: 27 mg/dL — ABNORMAL HIGH (ref 8–23)
BUN: 26 mg/dL — ABNORMAL HIGH (ref 8–23)
CO2: 28 mmol/L (ref 22–32)
CO2: 28 mmol/L (ref 22–32)
Calcium: 8.2 mg/dL — ABNORMAL LOW (ref 8.9–10.3)
Calcium: 8.4 mg/dL — ABNORMAL LOW (ref 8.9–10.3)
Chloride: 100 mmol/L (ref 98–111)
Chloride: 101 mmol/L (ref 98–111)
Creatinine, Ser: 2.36 mg/dL — ABNORMAL HIGH (ref 0.61–1.24)
Creatinine, Ser: 2.32 mg/dL — ABNORMAL HIGH (ref 0.61–1.24)
GFR, Estimated: 29 mL/min — ABNORMAL LOW (ref >60)
GFR, Estimated: 30 mL/min — ABNORMAL LOW (ref >60)
Glucose, Bld: 97 mg/dL (ref 70–99)
Glucose, Bld: 84 mg/dL (ref 70–99)
Potassium: 2.8 mmol/L — ABNORMAL LOW (ref 3.5–5.1)
Potassium: 3.2 mmol/L — ABNORMAL LOW (ref 3.5–5.1)
Sodium: 137 mmol/L (ref 135–145)
Sodium: 137 mmol/L (ref 135–145)

## 2023-01-09 LAB — MAGNESIUM
Magnesium: 1.8 mg/dL (ref 1.7–2.4)
Magnesium: 1.8 mg/dL (ref 1.7–2.4)

## 2023-01-09 LAB — CBC
HCT: 30.4 % — ABNORMAL LOW (ref 39.0–52.0)
HCT: 31.1 % — ABNORMAL LOW (ref 39.0–52.0)
Hemoglobin: 10.3 g/dL — ABNORMAL LOW (ref 13.0–17.0)
Hemoglobin: 10.3 g/dL — ABNORMAL LOW (ref 13.0–17.0)
MCH: 27.3 pg (ref 26.0–34.0)
MCH: 27.2 pg (ref 26.0–34.0)
MCHC: 33.9 g/dL (ref 30.0–36.0)
MCHC: 33.1 g/dL (ref 30.0–36.0)
MCV: 80.6 fL (ref 80.0–100.0)
MCV: 82.1 fL (ref 80.0–100.0)
Platelets: 342 10*3/uL (ref 150–400)
Platelets: 339 10*3/uL (ref 150–400)
RBC: 3.77 MIL/uL — ABNORMAL LOW (ref 4.22–5.81)
RBC: 3.79 MIL/uL — ABNORMAL LOW (ref 4.22–5.81)
RDW: 14.4 % (ref 11.5–15.5)
RDW: 14.4 % (ref 11.5–15.5)
WBC: 12.3 10*3/uL — ABNORMAL HIGH (ref 4.0–10.5)
WBC: 11.8 10*3/uL — ABNORMAL HIGH (ref 4.0–10.5)
nRBC: 0 % (ref 0.0–0.2)
nRBC: 0 % (ref 0.0–0.2)

## 2023-01-09 MED ORDER — SACUBITRIL-VALSARTAN 49-51 MG PO TABS
1.0000 | ORAL_TABLET | Freq: Two times a day (BID) | ORAL | Status: DC
  Administered 2023-01-09 – 2023-01-10 (×3): 1 via ORAL
  Filled 2023-01-09 (×3): qty 1

## 2023-01-09 MED ORDER — AMLODIPINE BESYLATE 10 MG PO TABS
10.0000 mg | ORAL_TABLET | Freq: Every day | ORAL | Status: DC
  Administered 2023-01-10 – 2023-01-11 (×2): 10 mg via ORAL
  Filled 2023-01-09 (×2): qty 1

## 2023-01-09 MED ORDER — DEXTROSE 50 % IV SOLN
1.0000 | Freq: Once | INTRAVENOUS | Status: DC
  Filled 2023-01-09: qty 50

## 2023-01-09 MED ORDER — INSULIN GLARGINE-YFGN 100 UNIT/ML ~~LOC~~ SOLN: 25.0000 [IU] | Freq: Every day | SUBCUTANEOUS | Status: DC

## 2023-01-09 MED ORDER — INSULIN GLARGINE-YFGN 100 UNIT/ML ~~LOC~~ SOLN
10.0000 [IU] | Freq: Every day | SUBCUTANEOUS | Status: DC
  Administered 2023-01-10 – 2023-01-11 (×2): 10 [IU] via SUBCUTANEOUS
  Filled 2023-01-09 (×2): qty 0.1

## 2023-01-09 MED ORDER — POTASSIUM CHLORIDE CRYS ER 20 MEQ PO TBCR
40.0000 meq | EXTENDED_RELEASE_TABLET | Freq: Two times a day (BID) | ORAL | Status: DC
  Administered 2023-01-09 – 2023-01-10 (×3): 40 meq via ORAL
  Filled 2023-01-09 (×3): qty 2

## 2023-01-09 MED ORDER — DEXTROSE 50 % IV SOLN
1.0000 | Freq: Every day | INTRAVENOUS | Status: DC | PRN
  Administered 2023-01-10: 50 mL via INTRAVENOUS
  Filled 2023-01-09: qty 50

## 2023-01-09 NOTE — Progress Notes (Signed)
Progress Note   Patient: Brendan [name]. HN:[id number] DOB: [birth year] DOA: 01/07/2023     1 DOS: the patient was seen and examined on 01/09/2023   Brief hospital course: Mr. Arntzen was admitted to the hospital with the working diagnosis of heart failure decompensation.   67 yo male with the past medical history of hypertension, T2DM, heart failure, obesity and CKD who presented with dyspnea. Reported worsening dyspnea for 3 weeks, along with lower extremity edema and 30 lbs weight gain. He was evaluated by his primary care provider and referred to the ED for further evaluation. On his initial physical examination his blood pressure was 182/82, HR 62, RR 19 and 02 saturation 99%, lungs with no wheezing or rales, heart with S1 and S2 present and rhythmic, abdomen with no distention and positive lower extremity edema.   Na 138, K 3,7 CL 103, bicarbonate 26 glucose 229 bun 26 cr 2,43  BNP 722.9 High sensitive troponin 24 and 24  Wbc 14.5 hgb 10,8 plt 376   Chest radiograph with cardiomegaly with bilateral hilar vascular congestion and bilateral cephalization of the vasculature, positive fluid in the right fissure.   EKG 63 bpm, left axis deviation, sinus rhythm with left atrial enlargement, no significant ST segment or T wave changes.   Patient was placed on IV furosemide for diuresis.     Assessment and Plan: * Acute on chronic diastolic CHF (congestive heart failure) (HCC) Echocardiogram with preserved LV systolic function EF 55 to 60%, moderate dilatation of LV, mild LVH, RV systolic function preserved, LA with moderate dilatation.   Urine output is [id number] Systolic blood pressure [id number] to 160 mmHg.   Plan to continue diuresis with furosemide IV 80 mg q12 Continue carvedilol and empagliflozin  Finerenon and entresto.    Hypertension Continue blood pressure control with entresto, carvedilol and fenrenone.  Diuresis with IV furosemide.   CKD (chronic kidney disease) stage 4, GFR  15-29 ml/min (HCC) Hypokalemia.  Renal function with serum cr at 2,36 with K at 2,9 and serum bicarbonate 26. Follow up renal function in am, avoid hypotension and nephrotoxic medications.   Hypothyroidism Continue synthroid  Insulin-requiring or dependent type II diabetes mellitus ([place]) Hypoglycemia.   Will discontinue pre meal insulin and will reduced basal insulin to 25 units. Plan to continue insulin coverage with insulin sliding scale.   Obstructive sleep apnea Continue home CPAP        Subjective: Patient is feeing better, dyspnea and edema are improving but not back to baseline. His glucose has been low   Physical Exam: Vitals:   01/09/23 0310 01/09/23 0500 01/09/23 0804 01/09/23 1148  BP: (!) 165/75  (!) 167/78 (!) 143/86  Pulse: (!) 59  (!) 58   Resp:      Temp: (!) 97.4 F (36.3 C)  97.9 F (36.6 C) 97.6 F (36.4 C)  TempSrc: Oral  Oral Oral  SpO2: 100%  100%   Weight:  (!) 136.8 kg    Height:       Neurology awake and alert ENT with mild pallor Cardiovascular with S1 and S2 present and rhythmic with no gallops, rubs or murmurs Respiratory with no rales or wheezing Abdomen with no distention  Positive lower extremity edema +  Data Reviewed:    Family Communication: no family at the bedside   Disposition: Status is: Inpatient Remains inpatient appropriate because: IV diuresis   Planned Discharge Destination: Home      Author: Tawni Millers, MD 01/09/2023 1:38  PM  For on call review [email or web address].

## 2023-01-09 NOTE — Progress Notes (Signed)
CARDIAC REHAB PHASE I   PRE:  Rate/Rhythm: 55 SB    BP: sitting 135/75    SpO2: 97 RA  MODE:  Ambulation: 280 ft   POST:  Rate/Rhythm: 62 SR    BP: sitting 135/69     SpO2: 97 RA  Pt ambulated hall independently at slow pace. No major c/o, breathing much improved. Return to EOB, RD in process of educating wife/pt. Left HF booklet and encouraged pt to read. Will f/u.  Encouraged pt to walk ad lib. [id number]  Yves Dill BS, ACSM-CEP 01/09/2023 11:33 AM

## 2023-01-09 NOTE — Progress Notes (Signed)
   SITE # 001       INTERVAL:  [x]$  Screening / Baseline  []$   24 Hrs - During Tx []$  48 Hrs - During Tx   []$   72 Hrs - End of Tx []$  Post Tx - Within 24 Hrs  []$  Post Tx - 72 hours after Tx Initiation  []$  Post Tx - 7 Days after Tx  Initiation  []$  Discharge []$  7-Day Follow-Up []$  30 -Day Follow-UP  Exam Performed:  [x]$  Yes  []$  No  Estimated pounds over dry weight:  ___15________ lbs  Is Subject experiencing Shortness of Breath?  [x]$  Yes   []$   No  Dyspnea VAS: (10 being the worst)   []$  1 []$  2 []$  3 []$  4 [x]$  5 []$  6 []$  7 []$ 8 []$  9 []$  10  Is Subject experiencing Orthopnea? [x]$  Yes    []$  No  Is there evidence of Rales?      []$  Yes  [x]$   No    If Yes, Characterize:  []$  Rhonchi      []$  Crackles      []$  Wheezing      []$  Stridor    Ascites? []$   Yes  [x]$   No  Is there pleural effusion?  []$  Yes  [x]$  No   []$  Not done   Leg Edema?  [x]$  Yes    []$  No         If yes, grade of edema:  []$  1+  [x]$  2+  []$  3+  []$  4+  Sacral Edema?  []$  Yes   [x]$  No        If yes, grade of edema:    []$  1+  []$  2+  []$  3+  []$  4+  Jugular Vein Distention (JVD)?:  [x]$  Yes  []$   No        If yes, provide measurement:   ___11-12_ cm  NYHA Classification:  []$  I   []$  II  [x]$  III  []$   IV  []$   N/A  Has subject expressed presence of thirst?  [x]$  Yes  []$   No       If Yes, rate of thirst level (10 being the worst): []$  1 []$  2 []$  3 []$  4 [x]$  5 []$  6 []$  7 []$ 8 []$  9 []$  10   ++++  PROVIDER NOTES OR COMMENTS:   Travone Georg NP-C  10:07 AM

## 2023-01-09 NOTE — Progress Notes (Addendum)
Advanced Heart Failure Rounding Note  PCP-Cardiologist: None   Subjective:   Admitted with A/C HFpEF. Diuresing with IV laix. Negative  3.4 liters.   Creatinine 2.4>2.4   Feeling a little better.  Objective:   Weight Range: (!) 136.8 kg Body mass index is 38.72 kg/m.   Vital Signs:   Temp:  [97.3 F (36.3 C)-98.4 F (36.9 C)] 97.9 F (36.6 C) (02/21 0804) Pulse Rate:  [57-61] 58 (02/21 0804) Resp:  [18] 18 (02/20 2048) BP: (125-180)/(61-82) 167/78 (02/21 0804) SpO2:  [98 %-100 %] 100 % (02/21 0804) Weight:  [136.8 kg-138.4 kg] 136.8 kg (02/21 0500)    Weight change: Filed Weights   01/08/23 0300 01/08/23 1613 01/09/23 0500  Weight: (!) 140.9 kg (!) 138.4 kg (!) 136.8 kg    Intake/Output:   Intake/Output Summary (Last 24 hours) at 01/09/2023 0940 Last data filed at 01/09/2023 0826 Gross per 24 hour  Intake 1320 ml  Output 5025 ml  Net -3705 ml      Physical Exam   General:  . No resp difficulty. Sitting on the side of the bed.  HEENT: normal Neck: supple. JVP 8-9 . Carotids 2+ bilat; no bruits. No lymphadenopathy or thryomegaly appreciated. Cor: PMI nondisplaced. Regular rate & rhythm. No rubs, gallops or murmurs. Lungs: clear Abdomen: obese, soft, nontender, nondistended. No hepatosplenomegaly. No bruits or masses. Good bowel sounds. Extremities: no cyanosis, clubbing, rash, R and LLE 1+ edema Neuro: alert & orientedx3, cranial nerves grossly intact. moves all 4 extremities w/o difficulty. Affect pleasant  Telemetry   SR   EKG    N/A   Labs    CBC Recent Labs    01/09/23 0132 01/09/23 0650  WBC 12.3* 11.8*  HGB 10.3* 10.3*  HCT 30.4* 31.1*  MCV 80.6 82.1  PLT 342 [id number]   Basic Metabolic Panel Recent Labs    01/08/23 1600 01/09/23 0132 01/09/23 0650  NA 141 137 137  K 2.9* 2.8* 2.9*  CL 103 100 101  CO2 27 28 26  $ GLUCOSE 65* 97 152*  BUN 27* 27* 27*  CREATININE 2.23* 2.36* 2.36*  CALCIUM 8.9 8.2* 8.4*  MG 1.9  --  1.8    Liver Function Tests No results for input(s): "AST", "ALT", "ALKPHOS", "BILITOT", "PROT", "ALBUMIN" in the last 72 hours. No results for input(s): "LIPASE", "AMYLASE" in the last 72 hours. Cardiac Enzymes No results for input(s): "CKTOTAL", "CKMB", "CKMBINDEX", "TROPONINI" in the last 72 hours.  BNP: BNP (last 3 results) Recent Labs    01/07/23 1918  BNP 722.9*    ProBNP (last 3 results) No results for input(s): "PROBNP" in the last 8760 hours.   D-Dimer No results for input(s): "DDIMER" in the last 72 hours. Hemoglobin A1C Recent Labs    01/08/23 0129  HGBA1C 8.6*   Fasting Lipid Panel No results for input(s): "CHOL", "HDL", "LDLCALC", "TRIG", "CHOLHDL", "LDLDIRECT" in the last 72 hours. Thyroid Function Tests No results for input(s): "TSH", "T4TOTAL", "T3FREE", "THYROIDAB" in the last 72 hours.  Invalid input(s): "FREET3"  Other results:   Imaging    ECHOCARDIOGRAM COMPLETE  Result Date: 01/08/2023    ECHOCARDIOGRAM REPORT   Patient Name:   Brendan [name]. Date of Exam: 01/08/2023 Medical Rec #:  [id number]       Height:       74.0 in Accession #:    [id number]      Weight:       310.6 lb Date of Birth:  1956/05/13  BSA:          2.621 m Patient Age:    67 years        BP:           141/68 mmHg Patient Gender: M               HR:           60 bpm. Exam Location:  Inpatient Procedure: 2D Echo, Cardiac Doppler, Color Doppler and Intracardiac            Opacification Agent Indications:    CHF  History:        Patient has prior history of Echocardiogram examinations. CHF;                 Risk Factors:Diabetes and Hypertension.  Sonographer:    Meagan Baucom RDCS, FE, PE Referring Phys: Etta Quill  Sonographer Comments: Image acquisition challenging due to patient body habitus. IMPRESSIONS  1. Left ventricular ejection fraction, by estimation, is 55 to 60%. The left ventricle has normal function. The left ventricle has no regional wall motion abnormalities. The left  ventricular internal cavity size was moderately dilated. There is mild concentric left ventricular hypertrophy. Left ventricular diastolic parameters are consistent with Grade II diastolic dysfunction (pseudonormalization).  2. Right ventricular systolic function is normal. The right ventricular size is normal. There is normal pulmonary artery systolic pressure.  3. Left atrial size was moderately dilated.  4. Right atrial size was mildly dilated.  5. The mitral valve was not well visualized. No evidence of mitral valve regurgitation.  6. The aortic valve was not well visualized. Aortic valve regurgitation is not visualized. Comparison(s): No significant change from prior study. FINDINGS  Left Ventricle: Left ventricular ejection fraction, by estimation, is 55 to 60%. The left ventricle has normal function. The left ventricle has no regional wall motion abnormalities. The left ventricular internal cavity size was moderately dilated. There is mild concentric left ventricular hypertrophy. Left ventricular diastolic parameters are consistent with Grade II diastolic dysfunction (pseudonormalization). Right Ventricle: The right ventricular size is normal. No increase in right ventricular wall thickness. Right ventricular systolic function is normal. There is normal pulmonary artery systolic pressure. The tricuspid regurgitant velocity is 1.30 m/s, and  with an assumed right atrial pressure of 3 mmHg, the estimated right ventricular systolic pressure is 9.8 mmHg. Left Atrium: Left atrial size was moderately dilated. Right Atrium: Right atrial size was mildly dilated. Pericardium: Trivial pericardial effusion is present. The pericardial effusion is anterior to the right ventricle. Presence of epicardial fat layer. Mitral Valve: The mitral valve was not well visualized. No evidence of mitral valve regurgitation. Tricuspid Valve: The tricuspid valve is grossly normal. Tricuspid valve regurgitation is not demonstrated. Aortic  Valve: The aortic valve was not well visualized. Aortic valve regurgitation is not visualized. Pulmonic Valve: The pulmonic valve was not well visualized. Pulmonic valve regurgitation is not visualized. Aorta: The aortic root and ascending aorta are structurally normal, with no evidence of dilitation. IAS/Shunts: No atrial level shunt detected by color flow Doppler.  LEFT VENTRICLE PLAX 2D LVIDd:         6.40 cm   Diastology LVIDs:         4.90 cm   LV e' medial:    5.08 cm/s LV PW:         1.20 cm   LV E/e' medial:  16.4 LV IVS:        1.10 cm   LV  e' lateral:   4.56 cm/s LVOT diam:     2.00 cm   LV E/e' lateral: 18.2 LV SV:         97 LV SV Index:   37 LVOT Area:     3.14 cm  RIGHT VENTRICLE RV S prime:     14.20 cm/s TAPSE (M-mode): 1.8 cm LEFT ATRIUM              Index        RIGHT ATRIUM           Index LA diam:        5.20 cm  1.98 cm/m   RA Area:     25.50 cm LA Vol (A2C):   119.0 ml 45.41 ml/m  RA Volume:   84.90 ml  32.40 ml/m LA Vol (A4C):   110.0 ml 41.97 ml/m LA Biplane Vol: 120.0 ml 45.79 ml/m  AORTIC VALVE LVOT Vmax:   127.00 cm/s LVOT Vmean:  85.100 cm/s LVOT VTI:    0.310 m  AORTA Ao Root diam: 2.80 cm Ao Asc diam:  3.30 cm MITRAL VALVE               TRICUSPID VALVE MV Area (PHT): 1.86 cm    TR Peak grad:   6.8 mmHg MV Decel Time: 408 msec    TR Vmax:        130.00 cm/s MV E velocity: 83.20 cm/s MV A velocity: 49.90 cm/s  SHUNTS MV E/A ratio:  1.67        Systemic VTI:  0.31 m                            Systemic Diam: 2.00 cm Rudean Haskell MD Electronically signed by Rudean Haskell MD Signature Date/Time: 01/08/2023/1:56:47 PM    Final      Medications:     Scheduled Medications:  amLODipine  5 mg Oral Daily   brimonidine  1 drop Both Eyes Q12H   And   timolol  1 drop Both Eyes Q12H   carvedilol  25 mg Oral BID WC   dapagliflozin propanediol  10 mg Oral Daily   enoxaparin (LOVENOX) injection  40 mg Subcutaneous Q24H   Finerenone  1 tablet Oral q morning   furosemide   80 mg Intravenous BID   hydrALAZINE  100 mg Oral TID   insulin aspart  0-15 Units Subcutaneous TID WC   insulin aspart  0-5 Units Subcutaneous QHS   insulin aspart  4 Units Subcutaneous TID WC   insulin glargine-yfgn  38 Units Subcutaneous Daily   levothyroxine  137 mcg Oral Q0600   simvastatin  20 mg Oral QPM   sodium chloride flush  3 mL Intravenous Q12H    Infusions:  sodium chloride      PRN Medications: sodium chloride, acetaminophen, ondansetron (ZOFRAN) IV, sodium chloride flush    Patient Profile    Brendan [name] is a 67 year old with a history of obesity, DMII, Hyperthyroidism, CKD Stage IIIb, HTN, OSA on CPAP, and HFpEF.    Admitted with A/C HFpEF.   Assessment/Plan   1. A/C HFpEF  Echo 2023 EF preserved Grade 1 DD. Echo EF 55-60% RV ok. Grade II DD.  Admitted with volume overload. Suspect volume overloaded in the setting of liberalized diet. BNP 722.  Screen for FASTR trial ---> Usual Care  - Volume status improving. Continue IV lasix. Needs additional diuresis. Continue IV lasix.  Supp K.  - Continue farxiga ? On Kerendia at home? He has not brought medication  from home.  -- Low sodium diet and limit fluids to < 2 liters per day. Consult dietitian.  -Renal  function stable.  PYP ordered. SPEP ordered.   2. HTN   -Elevated. Continue coreg 25 mg twice a day - Continue hydralazine 100 mg three times a day  - Increase amlodipine 10 mg daily.   - Add entresto 49-51 mg twice a day. At home he is on telmisartan.  - Hold off on ARB consider ARNi    3. CKD Stage IV -Creatinine baseline 2.4-2.6 .  - creatinine 2.4 today.   - On farxiga 10 mg daily  -Followed in the community by Dr Joelyn Oms    4. OSA -Continue nightly CPAP    5. DMII -On SSI  -Hgb A1C 8.6    6. Obesity  Body mass index is 38.72 kg/m. Consult dietitian.    Length of Stay: 1  Amy Clegg, NP  01/09/2023, 9:40 AM  Advanced Heart Failure Team Pager [phone] (M-F; 7a - 5p)  Please contact  [facility] for night-coverage after hours (5p -7a ) and weekends on [email or web address]      Targeted Physical Exam  Exam Performed? [x]$ Yes  []$  No   Date/Time of Assessment 01-09-23 :  Time 1000        Height           in    74  Estimated pounds over dry weight             lb    8 pounds  Estimated excess fluid volume               L    Is subject experiencing Shortness of Breath? []$ Yes [x]$ No   Dyspnea VAS: (10 being the worst)         Is subject experiencing Orthopnea? []$ Yes [x]$ No    Is there evidence of Rales? []$ Yes [x]$ No   []$  Rhonchi []$  Crackles []$  Wheezing []$  Stridor       Is there presence of ascites? []$ Yes [x]$ No    Is there leg Edema? [x]$ Yes []$ No   Grade of edema   1+       Is there sacral Edema? []$ Yes [x]$ No   Grade of edema          Is there Jugular Vein Distention (JVD)? [x]$ Yes []$ No        If yes, Provide measurement 9 cm  NYHA Classification: II      Has subject expressed presence of thirst?  [x]$ Yes []$ No        Rate of thirst level (10 being the worst):   COMMENTS:  5        Amy Clegg NP-C  10:04 AM  Patient seen with NP, agree with the above note.   Good diuresis yesterday, creatinine stable at 2.36.  BP still high.  Walked in hall, breathing better.   General: NAD Neck: JVP 10 cm, no thyromegaly or thyroid nodule.  Lungs: Clear to auscultation bilaterally with normal respiratory effort. CV: Nondisplaced PMI.  Heart regular S1/S2, no S3/S4, no murmur.  1+ ankle edema.  Abdomen: Soft, nontender, no hepatosplenomegaly, no distention.  Skin: Intact without lesions or rashes.  Neurologic: Alert and oriented x 3.  Psych: Normal affect. Extremities: No clubbing or cyanosis.  HEENT: Normal.   Continue Lasix 80 mg IV bid, reassess tomorrow.    Increasing anti-hypertensive regimen as above.  Cannot get PYP scan as inpatient due to radiotracer shortage.   Loralie Champagne 01/09/2023 3:53 PM

## 2023-01-09 NOTE — Hospital Course (Addendum)
Brendan [name] was admitted to the hospital with the working diagnosis of heart failure decompensation.   67 yo male with the past medical history of hypertension, T2DM, heart failure, obesity and CKD who presented with dyspnea. Reported worsening dyspnea for 3 weeks, along with lower extremity edema and 30 lbs weight gain. He was evaluated by his primary care provider and referred to the ED for further evaluation. On his initial physical examination his blood pressure was 182/82, HR 62, RR 19 and 02 saturation 99%, lungs with no wheezing or rales, heart with S1 and S2 present and rhythmic, abdomen with no distention and positive lower extremity edema.   Na 138, K 3,7 CL 103, bicarbonate 26 glucose 229 bun 26 cr 2,43  BNP 722.9 High sensitive troponin 24 and 24  Wbc 14.5 hgb 10,8 plt 376   Chest radiograph with cardiomegaly with bilateral hilar vascular congestion and bilateral cephalization of the vasculature, positive fluid in the right fissure.   EKG 63 bpm, left axis deviation, sinus rhythm with left atrial enlargement, no significant ST segment or T wave changes.   Patient was placed on IV furosemide for diuresis.   02/22 volume status is improving.  02/23 transitioned to oral torsemide.

## 2023-01-09 NOTE — Progress Notes (Signed)
   SITE # 001       INTERVAL:  []$  Screening / Baseline  [x]$   24 Hrs - During Tx []$  48 Hrs - During Tx   []$   72 Hrs - End of Tx []$  Post Tx - Within 24 Hrs  []$  Post Tx - 72 hours after Tx Initiation  []$  Post Tx - 7 Days after Tx  Initiation  []$  Discharge []$  7-Day Follow-Up []$  30 -Day Follow-UP  Exam Performed:  [x]$  Yes  []$  No  Estimated pounds over dry weight:  __10________ lbs  Is Subject experiencing Shortness of Breath?  []$  Yes   [x]$   No  Dyspnea VAS: (10 being the worst)   []$  1 []$  2 []$  3 []$  4 []$  5 []$  6 []$  7 []$ 8 []$  9 []$  10  Is Subject experiencing Orthopnea? []$  Yes    [x]$  No  Is there evidence of Rales?      []$  Yes  [x]$   No    If Yes, Characterize:  []$  Rhonchi      []$  Crackles      []$  Wheezing      []$  Stridor    Ascites? []$   Yes  [x]$   No  Is there pleural effusion?  []$  Yes  [x]$  No   []$  Not done   Leg Edema?  [x]$  Yes    []$  No         If yes, grade of edema:  []$  1+  [x]$  2+  []$  3+  []$  4+  Sacral Edema?  []$  Yes   [x]$  No        If yes, grade of edema:    []$  1+  []$  2+  []$  3+  []$  4+  Jugular Vein Distention (JVD)?:  [x]$  Yes  []$   No        If yes, provide measurement:   __9__ cm  NYHA Classification:  []$  I   [x]$  II  []$  III  []$   IV  []$   N/A  Has subject expressed presence of thirst?  [x]$  Yes  []$   No       If Yes, rate of thirst level (10 being the worst): []$  1 []$  2 []$  3 []$  4 [x]$  5 []$  6 []$  7 []$ 8 []$  9 []$  10   ++++  PROVIDER NOTES OR COMMENTS:    Orlena Garmon NP-C  10:06 AM

## 2023-01-09 NOTE — Progress Notes (Signed)
Nutrition Education Note  RD consulted for nutrition education regarding CHF and diabetes. Met with pt and wife in room.   Lab Results  Component Value Date   HGBA1C 8.6 (H) 01/08/2023     RD provided "Heart Healthy Nutrition Therapy" handout from the Academy of Nutrition and Dietetics. Reviewed patient's dietary recall. Provided examples on ways to decrease sodium intake in diet. Discouraged intake of processed foods and use of salt shaker. Encouraged fresh or frozen fruits and vegetables or rinsing canned goods. Encouraged pt and wife to read food label for sodium content of items.   RD discussed why it is important for patient to adhere to diet recommendations, and emphasized the role of fluids, foods to avoid, and importance of weighing self daily. Pt reports that he does not weigh every day, RD strongly encouraged incorporating it into his everyday routine.   Discussed the Plate Method for Diabetes with pt and wife. Encouraged pt to consume protein with all carbohydrates. Pt shares that his A1c has dropped from in the 9's. Pt with continuous glucose monitor on and shares that he has lows at night and in the morning if he does not eat.   Teach back method used. Expect good compliance.  Body mass index is 38.72 kg/m. Pt meets criteria for obese based on current BMI.  Current diet order is Heart Healthy/Carb Modified, no meal documentation's at this time. Labs and medications reviewed.  No further nutrition interventions warranted at this time. If additional nutrition issues arise, please re-consult RD.    Hermina Barters RD, LDN Clinical Dietitian See Shea Evans for contact information.

## 2023-01-09 NOTE — Discharge Instructions (Signed)
Plate Method for Diabetes   Foods with carbohydrates make your blood glucose level go up. The plate method is a simple way to meal plan and control the amount of carbohydrate you eat.         Use the following guidance to build a healthy plate to control carbohydrates. Divide a 9-inch plate into 3 sections, and consider your beverage the 4th section of your meal: Food Group Examples of Foods/Beverages for This Section of your Meal  Section 1: Non-starchy vegetables Fill  of your plate to include non-starchy vegetables Asparagus, broccoli, brussels sprouts, cabbage, carrots, cauliflower, celery, cucumber, green beans, mushrooms, peppers, salad greens, tomatoes, or zucchini.  Section 2: Protein foods Fill  of your plate to include a lean protein Lean meat, poultry, fish, seafood, cheese, eggs, lean deli meat, tofu, beans, lentils, nuts or nut butters.  Section 3: Carbohydrate foods Fill  of your plate to include carbohydrate foods Whole grains, whole wheat bread, brown rice, whole grain pasta, polenta, corn tortillas, fruit, or starchy vegetables (potatoes, green peas, corn, beans, acorn squash, and butternut squash). One cup of milk also counts as a food that contains carbohydrate.  Section 4: Beverage Choose water or a low-calorie drink for your beverage. Unsweetened tea, coffee, or flavored/sparkling water without added sugar.  Image reprinted with permission from The American Diabetes Association.  Copyright 2022 by the American Diabetes Association.   Copyright 2022  Academy of Nutrition and Dietetics. All rights reserved

## 2023-01-09 NOTE — Progress Notes (Signed)
Hypoglycemic Event  CBG: 64  Treatment: 8 oz juice/soda  Symptoms: None  Follow-up CBG: Time:1323 CBG Result:107  Possible Reasons for Event: Unknown   Kerra Guilfoil M Alexina Niccoli

## 2023-01-09 NOTE — Research (Signed)
Orders for 24 hr urine

## 2023-01-10 ENCOUNTER — Other Ambulatory Visit (HOSPITAL_COMMUNITY): Payer: Self-pay

## 2023-01-10 ENCOUNTER — Encounter: Payer: Medicare Other | Admitting: *Deleted

## 2023-01-10 DIAGNOSIS — I5033 Acute on chronic diastolic (congestive) heart failure: Secondary | ICD-10-CM | POA: Diagnosis not present

## 2023-01-10 DIAGNOSIS — Z006 Encounter for examination for normal comparison and control in clinical research program: Secondary | ICD-10-CM

## 2023-01-10 LAB — BASIC METABOLIC PANEL
Anion gap: 9 (ref 5–15)
Anion gap: 7 (ref 5–15)
BUN: 28 mg/dL — ABNORMAL HIGH (ref 8–23)
BUN: 31 mg/dL — ABNORMAL HIGH (ref 8–23)
CO2: 28 mmol/L (ref 22–32)
CO2: 28 mmol/L (ref 22–32)
Calcium: 8.5 mg/dL — ABNORMAL LOW (ref 8.9–10.3)
Calcium: 8.4 mg/dL — ABNORMAL LOW (ref 8.9–10.3)
Chloride: 102 mmol/L (ref 98–111)
Chloride: 100 mmol/L (ref 98–111)
Creatinine, Ser: 2.47 mg/dL — ABNORMAL HIGH (ref 0.61–1.24)
Creatinine, Ser: 2.63 mg/dL — ABNORMAL HIGH (ref 0.61–1.24)
GFR, Estimated: 28 mL/min — ABNORMAL LOW (ref >60)
GFR, Estimated: 26 mL/min — ABNORMAL LOW (ref >60)
Glucose, Bld: 187 mg/dL — ABNORMAL HIGH (ref 70–99)
Glucose, Bld: 361 mg/dL — ABNORMAL HIGH (ref 70–99)
Potassium: 3.2 mmol/L — ABNORMAL LOW (ref 3.5–5.1)
Potassium: 3.9 mmol/L (ref 3.5–5.1)
Sodium: 139 mmol/L (ref 135–145)
Sodium: 135 mmol/L (ref 135–145)

## 2023-01-10 LAB — GLUCOSE, CAPILLARY
Glucose-Capillary: 215 mg/dL — ABNORMAL HIGH (ref 70–99)
Glucose-Capillary: 63 mg/dL — ABNORMAL LOW (ref 70–99)
Glucose-Capillary: 193 mg/dL — ABNORMAL HIGH (ref 70–99)
Glucose-Capillary: 309 mg/dL — ABNORMAL HIGH (ref 70–99)
Glucose-Capillary: 343 mg/dL — ABNORMAL HIGH (ref 70–99)
Glucose-Capillary: 134 mg/dL — ABNORMAL HIGH (ref 70–99)

## 2023-01-10 LAB — MAGNESIUM
Magnesium: 1.8 mg/dL (ref 1.7–2.4)
Magnesium: 2.3 mg/dL (ref 1.7–2.4)

## 2023-01-10 MED ORDER — MAGNESIUM SULFATE 2 GM/50ML IV SOLN
2.0000 g | Freq: Once | INTRAVENOUS | Status: AC
  Administered 2023-01-10: 2 g via INTRAVENOUS
  Filled 2023-01-10: qty 50

## 2023-01-10 MED ORDER — SACUBITRIL-VALSARTAN 97-103 MG PO TABS
1.0000 | ORAL_TABLET | Freq: Two times a day (BID) | ORAL | Status: DC
  Administered 2023-01-10 – 2023-01-11 (×2): 1 via ORAL
  Filled 2023-01-10 (×3): qty 1

## 2023-01-10 MED ORDER — INSULIN ASPART 100 UNIT/ML IJ SOLN: 0.0000 [IU] | Freq: Three times a day (TID) | INTRAMUSCULAR | Status: DC

## 2023-01-10 MED ORDER — FUROSEMIDE 10 MG/ML IJ SOLN
80.0000 mg | Freq: Two times a day (BID) | INTRAMUSCULAR | Status: AC
  Administered 2023-01-10: 80 mg via INTRAVENOUS
  Filled 2023-01-10: qty 8

## 2023-01-10 MED ORDER — POTASSIUM CHLORIDE CRYS ER 20 MEQ PO TBCR
40.0000 meq | EXTENDED_RELEASE_TABLET | ORAL | Status: DC
  Administered 2023-01-10: 40 meq via ORAL
  Filled 2023-01-10: qty 2

## 2023-01-10 MED ORDER — INSULIN ASPART 100 UNIT/ML IJ SOLN
0.0000 [IU] | Freq: Three times a day (TID) | INTRAMUSCULAR | Status: DC
  Administered 2023-01-10 (×2): 11 [IU] via SUBCUTANEOUS
  Administered 2023-01-11: 15 [IU] via SUBCUTANEOUS
  Administered 2023-01-11: 5 [IU] via SUBCUTANEOUS

## 2023-01-10 MED ORDER — POTASSIUM CHLORIDE CRYS ER 20 MEQ PO TBCR
40.0000 meq | EXTENDED_RELEASE_TABLET | ORAL | Status: AC
  Administered 2023-01-10: 40 meq via ORAL
  Filled 2023-01-10: qty 2

## 2023-01-10 NOTE — Research (Addendum)
24 hour urine orders

## 2023-01-10 NOTE — Progress Notes (Signed)
Progress Note   Patient: Brendan [name]. [id number] DOB: Apr 26, 1956 DOA: 01/07/2023     2 DOS: the patient was seen and examined on 01/10/2023   Brief hospital course: Brendan [name] was admitted to the hospital with the working diagnosis of heart failure decompensation.   67 yo male with the past medical history of hypertension, T2DM, heart failure, obesity and CKD who presented with dyspnea. Reported worsening dyspnea for 3 weeks, along with lower extremity edema and 30 lbs weight gain. He was evaluated by his primary care provider and referred to the ED for further evaluation. On his initial physical examination his blood pressure was 182/82, HR 62, RR 19 and 02 saturation 99%, lungs with no wheezing or rales, heart with S1 and S2 present and rhythmic, abdomen with no distention and positive lower extremity edema.   Na 138, K 3,7 CL 103, bicarbonate 26 glucose 229 bun 26 cr 2,43  BNP 722.9 High sensitive troponin 24 and 24  Wbc 14.5 hgb 10,8 plt 376   Chest radiograph with cardiomegaly with bilateral hilar vascular congestion and bilateral cephalization of the vasculature, positive fluid in the right fissure.   EKG 63 bpm, left axis deviation, sinus rhythm with left atrial enlargement, no significant ST segment or T wave changes.   Patient was placed on IV furosemide for diuresis.   02/22 volume status is improving.   Assessment and Plan: * Acute on chronic diastolic CHF (congestive heart failure) (HCC) Echocardiogram with preserved LV systolic function EF 55 to 60%, moderate dilatation of LV, mild LVH, RV systolic function preserved, LA with moderate dilatation.   Urine output is 0000000 ml Systolic blood pressure [id number] to 150 mmHg.   Continue carvedilol and empagliflozin  Finerenone and entresto.  After load reduction with hydralazine  Transition to oral loop diuretic.    Hypertension Continue blood pressure control with amlodipine, entresto, hydralazine, carvedilol and fenrenone.   Change to oral loop diuretic   CKD (chronic kidney disease) stage 4, GFR 15-29 ml/min (HCC) Hypokalemia.  Improved volume status, renal function today with serum cr at 2,47 with K at 3,2 and serum bicarbonate at 28. Continue fenrenone and loop diuretic therapy.  Follow up renal function in am.  Continue K correction with Kcl.   Hypothyroidism Continue synthroid  Insulin-requiring or dependent type II diabetes mellitus ([place]) Hypoglycemia.   Fasting glucose is 187 today Continue to hold on short insulin therapy to prevent hypoglycemia.  Continue basal insulin at 10 units.   Obstructive sleep apnea Continue home CPAP        Subjective: Patient is feeling better, no chest pain or dyspnea, edema has improved.   Physical Exam: Vitals:   01/10/23 0020 01/10/23 0417 01/10/23 0421 01/10/23 0941  BP: (!) 143/75 131/65  (!) 152/63  Pulse: 60 (!) 59  61  Resp: 16 20  18  $ Temp: 98.1 F (36.7 C) 98.1 F (36.7 C)  (!) 97.5 F (36.4 C)  TempSrc: Oral Oral  Oral  SpO2: 99% 98%  100%  Weight:   134.1 kg   Height:       Neurology awake and alert ENT with no pallor Cardiovascular with S1 and S2 present and rhythmic with no gallops, rubs or murmurs No JVD No lower extremity edema Respiratory with no rales or wheezing Abdomen with no distention  Data Reviewed:    Family Communication: no family at the bedside   Disposition: Status is: Inpatient Remains inpatient appropriate because: heart failure medication optimization   Planned  Discharge Destination: Home      Author: Tawni Millers, MD 01/10/2023 11:16 AM  For on call review [email or web address].

## 2023-01-10 NOTE — Progress Notes (Addendum)
CARDIAC REHAB PHASE I   PRE:  Rate/Rhythm: 67 NSR  BP:  Sitting: 153/63      SaO2: 100 RA  MODE:  Ambulation: 150 ft   AD:  None  POST:  Rate/Rhythm: 84 NSR  BP:  Sitting: 130/50      SaO2: 100 RA  Pt amb with standby assistance, pt denies CP and minimal-moderate SOB during amb and was returned to room w/o complaint.   Pt walked well, very steady and w/o dizziness. Encouraged the pt to walk multiple times throughout the day barring unusual s/s   Pt received HF book and was educated on LE edema and other concerning s/s, recording daily weights and when to call provider, sodium reduction (printout provided), importance of taking meds and physical activity, and fluid restriction.   Pt does not qualify for CRPII d/t normal EF   Brendan [name]  10:54 AM 01/10/2023    Service time is from 1014 to 1059.

## 2023-01-10 NOTE — Progress Notes (Signed)
Hypoglycemic Event  CBG: 62  Treatment: 8 oz juice/soda and D50 50 mL (25 gm)  Symptoms: Shaky and Nervous/irritable  Follow-up CBG: Time:0155 CBG Result: 215  Possible Reasons for Event:  Unknown  Comments/MD notified: Pt with Freestyle Libre, reported feeling low and sugar dropping despite having drank juice and ensure prior, Libre showing low BG, RN checked BG on hospital machine. Utilized existing PRN order for Dextrose 50g IV for BG <80. Utilized hypoglycemia protocol blood glucose monitoring with existing medication order.    Brendan [name]

## 2023-01-10 NOTE — Progress Notes (Addendum)
Advanced Heart Failure Rounding Note  PCP-Cardiologist: None   Subjective:   Admitted with A/C HFpEF. Diuresing with IV lasix. Overall negative 10 liters. Weight down down 20 pounds.   Yesterday entresto added. BP   Creatinine 2.4>2.4> 2.5  Remains SOB walking down the hall.  Objective:   Weight Range: 134.1 kg Body mass index is 37.96 kg/m.   Vital Signs:   Temp:  [97.6 F (36.4 C)-98.1 F (36.7 C)] 98.1 F (36.7 C) (02/22 0417) Pulse Rate:  [58-60] 59 (02/22 0417) Resp:  [16-20] 20 (02/22 0417) BP: (131-173)/(65-86) 131/65 (02/22 0417) SpO2:  [98 %-99 %] 98 % (02/22 0417) Weight:  [134.1 kg-134.3 kg] 134.1 kg (02/22 0421)    Weight change: Filed Weights   01/09/23 0500 01/09/23 1600 01/10/23 0421  Weight: (!) 136.8 kg 134.3 kg 134.1 kg    Intake/Output:   Intake/Output Summary (Last 24 hours) at 01/10/2023 0936 Last data filed at 01/10/2023 0400 Gross per 24 hour  Intake 840 ml  Output 5525 ml  Net -4685 ml      Physical Exam   General:  Sitting on the side of the bed. No resp difficulty HEENT: normal Neck: supple. no JVD. Carotids 2+ bilat; no bruits. No lymphadenopathy or thryomegaly appreciated. Cor: PMI nondisplaced. Regular rate & rhythm. No rubs, gallops or murmurs. Lungs: clear Abdomen: soft, nontender, nondistended. No hepatosplenomegaly. No bruits or masses. Good bowel sounds. Extremities: no cyanosis, clubbing, rash, R and LLE 1+ edema Neuro: alert & orientedx3, cranial nerves grossly intact. moves all 4 extremities w/o difficulty. Affect pleasant  Telemetry   SR 50-60s   EKG    N/A   Labs    CBC Recent Labs    01/09/23 0132 01/09/23 0650  WBC 12.3* 11.8*  HGB 10.3* 10.3*  HCT 30.4* 31.1*  MCV 80.6 82.1  PLT 342 [id number]   Basic Metabolic Panel Recent Labs    01/09/23 1527 01/10/23 0705  NA 137 139  K 3.2* 3.2*  CL 101 102  CO2 28 28  GLUCOSE 84 187*  BUN 26* 28*  CREATININE 2.32* 2.47*  CALCIUM 8.4* 8.5*  MG 1.8  1.8   Liver Function Tests No results for input(s): "AST", "ALT", "ALKPHOS", "BILITOT", "PROT", "ALBUMIN" in the last 72 hours. No results for input(s): "LIPASE", "AMYLASE" in the last 72 hours. Cardiac Enzymes No results for input(s): "CKTOTAL", "CKMB", "CKMBINDEX", "TROPONINI" in the last 72 hours.  BNP: BNP (last 3 results) Recent Labs    01/07/23 1918  BNP 722.9*    ProBNP (last 3 results) No results for input(s): "PROBNP" in the last 8760 hours.   D-Dimer No results for input(s): "DDIMER" in the last 72 hours. Hemoglobin A1C Recent Labs    01/08/23 0129  HGBA1C 8.6*   Fasting Lipid Panel No results for input(s): "CHOL", "HDL", "LDLCALC", "TRIG", "CHOLHDL", "LDLDIRECT" in the last 72 hours. Thyroid Function Tests No results for input(s): "TSH", "T4TOTAL", "T3FREE", "THYROIDAB" in the last 72 hours.  Invalid input(s): "FREET3"  Other results:   Imaging    No results found.   Medications:     Scheduled Medications:  amLODipine  10 mg Oral Daily   brimonidine  1 drop Both Eyes Q12H   And   timolol  1 drop Both Eyes Q12H   carvedilol  25 mg Oral BID WC   dapagliflozin propanediol  10 mg Oral Daily   enoxaparin (LOVENOX) injection  40 mg Subcutaneous Q24H   Finerenone  1 tablet Oral q  morning   furosemide  80 mg Intravenous BID   hydrALAZINE  100 mg Oral TID   insulin glargine-yfgn  10 Units Subcutaneous Daily   levothyroxine  137 mcg Oral Q0600   potassium chloride  40 mEq Oral BID   sacubitril-valsartan  1 tablet Oral BID   simvastatin  20 mg Oral QPM   sodium chloride flush  3 mL Intravenous Q12H    Infusions:  sodium chloride      PRN Medications: sodium chloride, acetaminophen, dextrose, ondansetron (ZOFRAN) IV, sodium chloride flush    Patient Profile    Mr Stuller is a 67 year old with a history of obesity, DMII, Hyperthyroidism, CKD Stage IIIb, HTN, OSA on CPAP, and HFpEF.    Admitted with A/C HFpEF.   Assessment/Plan   1. A/C  HFpEF  Echo 2023 EF preserved Grade 1 DD. Echo EF 55-60% RV ok. Grade II DD.  Admitted with volume overload. Suspect volume overloaded in the setting of liberalized diet. BNP 722.  Screen for FASTR trial ---> Usual Care  -  Continue lasix 80 mg twice a day. Anticipate switching to torsemide tomorrow. Would switch to torsemide 80 mg daily  - Continue farxiga - Continue finerenone 20 mg daily.  -- Low sodium diet and limit fluids to < 2 liters per day. Consult dietitian.  -Renal  function stable.  PYP unable to obtain.  - SPEP-   2. HTN   -Elevated. Continue coreg 25 mg twice a day - Continue hydralazine 100 mg three times a day  - Continue amlodipine 10 mg daily.   - Increae entresto 97-103 mg twice a day. At home he is on telmisartan.  - Hold off on ARB consider ARNi    3. CKD Stage IV -Creatinine baseline 2.4-2.6 .  - creatinine trending up.--->2.5 today.   - On farxiga 10 mg daily  -Followed in the community by Dr Joelyn Oms    4. OSA -Continue nightly CPAP    5. DMII -On SSI  -Hgb A1C 8.6    6. Obesity  Body mass index is 37.96 kg/m. Dietitian appreciated.     SITE # 001       INTERVAL:  []$  Screening / Baseline  []$   24 Hrs - During Tx [x]$  48 Hrs - During Tx   []$   72 Hrs - End of Tx []$  Post Tx - Within 24 Hrs  []$  Post Tx - 72 hours after Tx Initiation  []$  Post Tx - 7 Days after Tx  Initiation  []$  Discharge []$  7-Day Follow-Up []$  30 -Day Follow-UP  Exam Performed:  [x]$  Yes  []$  No  Estimated pounds over dry weight:  5_ lbs  Is Subject experiencing Shortness of Breath?  [x]$  Yes   []$   No  Dyspnea VAS: (10 being the worst)   []$  1 []$  2 []$  3 []$  4 [x]$  5 []$  6 []$  7 []$ 8 []$  9 []$  10  Is Subject experiencing Orthopnea? []$  Yes    [x]$  No  Is there evidence of Rales?      []$  Yes  [x]$   No    If Yes, Characterize:  []$  Rhonchi      []$  Crackles      []$  Wheezing      []$  Stridor    Ascites? []$   Yes  [x]$   No  Is there pleural effusion?  []$  Yes  [x]$  No   []$  Not done    Leg Edema?  [x]$  Yes    []$   No         If yes, grade of edema:  [x]$  1+  []$  2+  []$  3+  []$  4+  Sacral Edema?  []$  Yes   [x]$  No        If yes, grade of edema:    []$  1+  []$  2+  []$  3+  []$  4+  Jugular Vein Distention (JVD)?:  [x]$  Yes  []$   No        If yes, provide measurement:   _7-8___ cm  NYHA Classification:  []$  I   []$  II  [x]$  III  []$   IV  []$   N/A  Has subject expressed presence of thirst?  []$  Yes  [x]$   No       If Yes, rate of thirst level (10 being the worst): []$  1 []$  2 []$  3 []$  4 []$  5 []$  6 []$  7 []$ 8 []$  9 []$  10   ++++  PROVIDER NOTES OR COMMENTS:      Length of Stay: 2  Darrick Grinder, NP  01/10/2023, 9:36 AM  Advanced Heart Failure Team Pager [phone] (M-F; 7a - 5p)  Please contact [facility] for night-coverage after hours (5p -7a ) and weekends on [email or web address]  Patient seen with NP, agree with the above note.   Good diuresis again, weight coming down.  Creatinine mildly higher at 2.4. SBP generally 140s.   General: NAD Neck: JVP 8-9, no thyromegaly or thyroid nodule.  Lungs: Clear to auscultation bilaterally with normal respiratory effort. CV: Nondisplaced PMI.  Heart regular S1/S2, no S3/S4, no murmur.  1+ ankle edema.  Abdomen: Soft, nontender, no hepatosplenomegaly, no distention.  Skin: Intact without lesions or rashes.  Neurologic: Alert and oriented x 3.  Psych: Normal affect. Extremities: No clubbing or cyanosis.  HEENT: Normal.   He is diuresing well, close to euvolemia.  Will give 1 more dose IV Lasix this evening.  Will likely switch to torsemide 80 mg daily tomorrow.   Continue current antihypertensives.    PYP scan as outpatient.   Loralie Champagne 01/10/2023 1:08 PM

## 2023-01-11 ENCOUNTER — Encounter: Payer: Medicare Other | Admitting: *Deleted

## 2023-01-11 ENCOUNTER — Other Ambulatory Visit (HOSPITAL_COMMUNITY): Payer: Self-pay

## 2023-01-11 ENCOUNTER — Ambulatory Visit: Payer: Medicare Other | Admitting: Adult Health

## 2023-01-11 DIAGNOSIS — I5033 Acute on chronic diastolic (congestive) heart failure: Secondary | ICD-10-CM | POA: Diagnosis not present

## 2023-01-11 DIAGNOSIS — Z006 Encounter for examination for normal comparison and control in clinical research program: Secondary | ICD-10-CM

## 2023-01-11 LAB — BASIC METABOLIC PANEL
Anion gap: 14 (ref 5–15)
Anion gap: 9 (ref 5–15)
BUN: 37 mg/dL — ABNORMAL HIGH (ref 8–23)
BUN: 38 mg/dL — ABNORMAL HIGH (ref 8–23)
CO2: 22 mmol/L (ref 22–32)
CO2: 26 mmol/L (ref 22–32)
Calcium: 8.4 mg/dL — ABNORMAL LOW (ref 8.9–10.3)
Calcium: 8.4 mg/dL — ABNORMAL LOW (ref 8.9–10.3)
Chloride: 100 mmol/L (ref 98–111)
Chloride: 95 mmol/L — ABNORMAL LOW (ref 98–111)
Creatinine, Ser: 2.78 mg/dL — ABNORMAL HIGH (ref 0.61–1.24)
Creatinine, Ser: 2.93 mg/dL — ABNORMAL HIGH (ref 0.61–1.24)
GFR, Estimated: 24 mL/min — ABNORMAL LOW (ref >60)
GFR, Estimated: 23 mL/min — ABNORMAL LOW (ref >60)
Glucose, Bld: 249 mg/dL — ABNORMAL HIGH (ref 70–99)
Glucose, Bld: 364 mg/dL — ABNORMAL HIGH (ref 70–99)
Potassium: 3.9 mmol/L (ref 3.5–5.1)
Potassium: 3.4 mmol/L — ABNORMAL LOW (ref 3.5–5.1)
Sodium: 136 mmol/L (ref 135–145)
Sodium: 130 mmol/L — ABNORMAL LOW (ref 135–145)

## 2023-01-11 LAB — PROTEIN ELECTROPHORESIS, SERUM
A/G Ratio: 0.7 (ref 0.7–1.7)
Albumin ELP: 2.4 g/dL — ABNORMAL LOW (ref 2.9–4.4)
Alpha-1-Globulin: 0.2 g/dL (ref 0.0–0.4)
Alpha-2-Globulin: 0.6 g/dL (ref 0.4–1.0)
Beta Globulin: 0.7 g/dL (ref 0.7–1.3)
Gamma Globulin: 0.8 g/dL (ref 0.4–1.8)
Globulin, Total: 3.3 g/dL (ref 2.2–3.9)
Total Protein ELP: 5.7 g/dL — ABNORMAL LOW (ref 6.0–8.5)

## 2023-01-11 LAB — SPECIFIC GRAVITY, URINE: Specific Gravity, UA: 1.009 (ref 1.005–1.030)

## 2023-01-11 LAB — CBC
HCT: 35.1 % — ABNORMAL LOW (ref 39.0–52.0)
Hemoglobin: 12 g/dL — ABNORMAL LOW (ref 13.0–17.0)
MCH: 27.4 pg (ref 26.0–34.0)
MCHC: 34.2 g/dL (ref 30.0–36.0)
MCV: 80.1 fL (ref 80.0–100.0)
Platelets: 406 10*3/uL — ABNORMAL HIGH (ref 150–400)
RBC: 4.38 MIL/uL (ref 4.22–5.81)
RDW: 14.3 % (ref 11.5–15.5)
WBC: 10.6 10*3/uL — ABNORMAL HIGH (ref 4.0–10.5)
nRBC: 0 % (ref 0.0–0.2)

## 2023-01-11 LAB — MAGNESIUM
Magnesium: 2.2 mg/dL (ref 1.7–2.4)
Magnesium: 2.3 mg/dL (ref 1.7–2.4)

## 2023-01-11 LAB — CREATININE, URINE, 24 HOUR
Creatinine, 24H Ur: 1554 mg/24 hr (ref 1000–2000)
Creatinine, Urine: 25.9 mg/dL

## 2023-01-11 LAB — POTASSIUM, URINE, 24 HOUR
Potassium Urine: 20.9 mmol/L
Potassium, Urine: 125 mmol/24 hr — ABNORMAL HIGH (ref 20–116)

## 2023-01-11 LAB — GLUCOSE, CAPILLARY
Glucose-Capillary: 240 mg/dL — ABNORMAL HIGH (ref 70–99)
Glucose-Capillary: 369 mg/dL — ABNORMAL HIGH (ref 70–99)

## 2023-01-11 LAB — CHLORIDE, URINE, RANDOM: Chloride, Ur: 79 mmol/L

## 2023-01-11 LAB — OSMOLALITY, URINE: Osmolality, Ur: 297 mOsmol/kg

## 2023-01-11 LAB — SODIUM, URINE, 24 HOUR
Sodium, 24H Ur: 564 mmol/24 hr — ABNORMAL HIGH (ref 58–337)
Sodium, Ur: 94 mmol/L

## 2023-01-11 MED ORDER — TORSEMIDE 20 MG PO TABS
80.0000 mg | ORAL_TABLET | Freq: Every day | ORAL | Status: DC
  Administered 2023-01-11: 80 mg via ORAL
  Filled 2023-01-11: qty 4

## 2023-01-11 MED ORDER — AMLODIPINE BESYLATE 10 MG PO TABS
10.0000 mg | ORAL_TABLET | Freq: Every day | ORAL | 0 refills | Status: DC
  Filled 2023-01-11: qty 30, 30d supply, fill #0

## 2023-01-11 MED ORDER — SACUBITRIL-VALSARTAN 97-103 MG PO TABS
1.0000 | ORAL_TABLET | Freq: Two times a day (BID) | ORAL | 0 refills | Status: DC
  Filled 2023-01-11: qty 60, 30d supply, fill #0

## 2023-01-11 MED ORDER — TORSEMIDE 20 MG PO TABS: 60.0000 mg | ORAL_TABLET | Freq: Every day | ORAL | Status: DC

## 2023-01-11 MED ORDER — DAPAGLIFLOZIN PROPANEDIOL 10 MG PO TABS
10.0000 mg | ORAL_TABLET | Freq: Every day | ORAL | 0 refills | Status: DC
  Filled 2023-01-11: qty 30, 30d supply, fill #0

## 2023-01-11 MED ORDER — TORSEMIDE 20 MG PO TABS
60.0000 mg | ORAL_TABLET | Freq: Every day | ORAL | 0 refills | Status: DC
  Filled 2023-01-11: qty 90, 30d supply, fill #0

## 2023-01-11 NOTE — Discharge Summary (Signed)
Physician Discharge Summary   Patient: Brendan [name]. MRN: [id number] DOB: [birth year]  Admit date:     01/07/2023  Discharge date: 01/11/23  Discharge Physician: Jimmy Picket Larya Charpentier   PCP: Seward Carol, MD   Recommendations at discharge:    Patient will continue diuresis with torsemide 60 mg daily, plus dapagliflozin and fenrenone.  Follow renal function and electrolytes next week. Follow up with Cardiology next week. Follow up with Dr Delfina Redwood in 7 to 10 days.   Discharge Diagnoses: Principal Problem:   Acute on chronic diastolic CHF (congestive heart failure) (HCC) Active Problems:   Hypertension   CKD (chronic kidney disease) stage 4, GFR 15-29 ml/min (HCC)   Insulin-requiring or dependent type II diabetes mellitus ([place])   Hypothyroidism   Obstructive sleep apnea   Research study patient   Acute on chronic congestive heart failure ([place])  Resolved Problems:   * No resolved hospital problems. [facility] Course: Mr. Cavin was admitted to the hospital with the working diagnosis of heart failure decompensation.   67 yo male with the past medical history of hypertension, T2DM, heart failure, obesity and CKD who presented with dyspnea. Reported worsening dyspnea for 3 weeks, along with lower extremity edema and 30 lbs weight gain. He was evaluated by his primary care provider and referred to the ED for further evaluation. On his initial physical examination his blood pressure was 182/82, HR 62, RR 19 and 02 saturation 99%, lungs with no wheezing or rales, heart with S1 and S2 present and rhythmic, abdomen with no distention and positive lower extremity edema.   Na 138, K 3,7 CL 103, bicarbonate 26 glucose 229 bun 26 cr 2,43  BNP 722.9 High sensitive troponin 24 and 24  Wbc 14.5 hgb 10,8 plt 376   Chest radiograph with cardiomegaly with bilateral hilar vascular congestion and bilateral cephalization of the vasculature, positive fluid in the right fissure.   EKG 63 bpm, left  axis deviation, sinus rhythm with left atrial enlargement, no significant ST segment or T wave changes.   Patient was placed on IV furosemide for diuresis.   02/22 volume status is improving.  02/23 transitioned to oral torsemide.   Assessment and Plan: * Acute on chronic diastolic CHF (congestive heart failure) (HCC) Echocardiogram with preserved LV systolic function EF 55 to 60%, moderate dilatation of LV, mild LVH, RV systolic function preserved, LA with moderate dilatation.   Patient was placed on IV furosemide for diuresis, negative fluid balance was achieved, -14,395 ml, with significant improvement in his symptoms.   Continue carvedilol and empagliflozin  Finerenone and entresto.  After load reduction with hydralazine  Loop diuretic therapy with torsemide with close follow up as outpatient.  PYP scan as outpatient.    Hypertension Continue blood pressure control with amlodipine, entresto, hydralazine, carvedilol and fenrenone.  Continue with oral loop diuretic   CKD (chronic kidney disease) stage 4, GFR 15-29 ml/min (HCC) Hypokalemia. AKI.   Renal function with serum cr at 2.73 with K at 3,9 and serum bicarbonate at 22. Mg 2,2 and Na 136,  Base serum cr around 2,4,   Continue diuresis with oral torsemide, SGLT 2 inh and fenrenone.  Follow up renal function and electrolytes as outpatient next week.   Hypothyroidism Continue synthroid  Insulin-requiring or dependent type II diabetes mellitus ([place]) Hypoglycemia.   Patient will resume his insulin regimen from home with close follow up on capillary glucose. His discharge fasting insulin is 249.  During his hospitalization his insulin dose  was decreased due to hypoglycemia.   Obstructive sleep apnea Continue home CPAP         Consultants: cardiology  Procedures performed: none   Disposition: Home Diet recommendation:  Discharge Diet Orders (From admission, onward)     Start     Ordered   01/11/23 0000   Diet - low sodium heart healthy        01/11/23 1332           Cardiac and Carb modified diet DISCHARGE MEDICATION: Allergies as of 01/11/2023       Reactions   Ace Inhibitors Cough   Maxidex [dexamethasone] Other (See Comments)   Sexual dysfunction   Verapamil Other (See Comments)   constipation        Medication List     STOP taking these medications    Potassium Chloride ER 20 MEQ Tbcr   telmisartan 80 MG tablet Commonly known as: MICARDIS   Trulicity 4.5 0000000 Sopn Generic drug: Dulaglutide       TAKE these medications    acetaminophen 500 MG tablet Commonly known as: TYLENOL Take 500 mg by mouth every 6 (six) hours as needed for mild pain.   amLODipine 10 MG tablet Commonly known as: NORVASC Take 1 tablet (10 mg total) by mouth daily. Start taking on: January 12, 2023   Basaglar KwikPen 100 UNIT/ML Inject 38 Units into the skin daily.   brimonidine-timolol 0.2-0.5 % ophthalmic solution Commonly known as: COMBIGAN Place 1 drop into both eyes every 12 (twelve) hours.   carvedilol 25 MG tablet Commonly known as: COREG Take 1 tablet (25 mg total) by mouth 2 (two) times daily. What changed: when to take this   dapagliflozin propanediol 10 MG Tabs tablet Commonly known as: Farxiga Take 1 tablet (10 mg total) by mouth daily. Start taking on: January 12, 2023   HumaLOG KwikPen 200 UNIT/ML KwikPen Generic drug: insulin lispro Inject 22 Units into the skin 3 (three) times daily. PLUS 4 extra units if blood sugar over 200 mg/dL   hydrALAZINE 100 MG tablet Commonly known as: APRESOLINE Take 1 tablet (100 mg total) by mouth 3 (three) times daily.   Kerendia 20 MG Tabs Generic drug: Finerenone Take 1 tablet by mouth every morning.   levothyroxine 137 MCG tablet Commonly known as: SYNTHROID Take 137 mcg by mouth every morning.   ONE TOUCH ULTRA SYSTEM KIT w/Device Kit 1 kit by Does not apply route once. One touch ultrasoft lancets    sacubitril-valsartan 97-103 MG Commonly known as: ENTRESTO Take 1 tablet by mouth 2 (two) times daily.   simvastatin 20 MG tablet Commonly known as: ZOCOR Take 20 mg by mouth every evening.   torsemide 20 MG tablet Commonly known as: DEMADEX Take 3 tablets (60 mg total) by mouth daily. Start taking on: January 12, 2023 What changed: See the new instructions.        Follow-up Information     [facility] Follow up on 01/16/2023.   Specialty: Cardiology Why: at 1:30 Contact information: [address] [id number] [place] [phone]               Discharge Exam: Filed Weights   01/10/23 0421 01/10/23 1524 01/11/23 0412  Weight: 134.1 kg 131 kg 132.7 kg   BP 133/61 (BP Location: Left Arm)   Pulse 66   Temp 97.7 F (36.5 C) (Oral)   Resp 18   Ht '6\' 2"'$  (1.88 m)  Wt 132.7 kg   SpO2 100%   BMI 37.55 kg/m   Neurology awake and alert ENT with no pallor Cardiovascular with S1 and S2 present and rhythmic with no gallops, rubs or murmurs Respiratory with no rales or wheezing, no rhonchi Abdomen with no distention   Condition at discharge: stable  The results of significant diagnostics from this hospitalization (including imaging, microbiology, ancillary and laboratory) are listed below for reference.   Imaging Studies: DG Chest 2 View  Result Date: 01/09/2023 CLINICAL DATA:  SOB chronic cough EXAM: CHEST - 2 VIEW COMPARISON:  03/25/2020 FINDINGS: The heart size and mediastinal contours are within normal limits. Lungs are hyperinflated suggesting COPD. Dependent basilar subsegmental atelectasis or scarring. Lungs are otherwise clear. No pneumothorax or pleural effusion. The visualized skeletal structures are unremarkable. IMPRESSION: Findings suggest COPD. Bibasilar scarring or subsegmental atelectasis. Electronically Signed   By: Sammie Bench M.D.   On: 01/09/2023 13:15    ECHOCARDIOGRAM COMPLETE  Result Date: 01/08/2023    ECHOCARDIOGRAM REPORT   Patient Name:   Brendan [name]. Date of Exam: 01/08/2023 Medical Rec #:  [id number]       Height:       74.0 in Accession #:    [id number]      Weight:       310.6 lb Date of Birth:  1956/04/19       BSA:          2.621 m Patient Age:    21 years        BP:           141/68 mmHg Patient Gender: M               HR:           60 bpm. Exam Location:  Inpatient Procedure: 2D Echo, Cardiac Doppler, Color Doppler and Intracardiac            Opacification Agent Indications:    CHF  History:        Patient has prior history of Echocardiogram examinations. CHF;                 Risk Factors:Diabetes and Hypertension.  Sonographer:    Meagan Baucom RDCS, FE, PE Referring Phys: Etta Quill  Sonographer Comments: Image acquisition challenging due to patient body habitus. IMPRESSIONS  1. Left ventricular ejection fraction, by estimation, is 55 to 60%. The left ventricle has normal function. The left ventricle has no regional wall motion abnormalities. The left ventricular internal cavity size was moderately dilated. There is mild concentric left ventricular hypertrophy. Left ventricular diastolic parameters are consistent with Grade II diastolic dysfunction (pseudonormalization).  2. Right ventricular systolic function is normal. The right ventricular size is normal. There is normal pulmonary artery systolic pressure.  3. Left atrial size was moderately dilated.  4. Right atrial size was mildly dilated.  5. The mitral valve was not well visualized. No evidence of mitral valve regurgitation.  6. The aortic valve was not well visualized. Aortic valve regurgitation is not visualized. Comparison(s): No significant change from prior study. FINDINGS  Left Ventricle: Left ventricular ejection fraction, by estimation, is 55 to 60%. The left ventricle has normal function. The left ventricle has no regional wall motion abnormalities. The left ventricular  internal cavity size was moderately dilated. There is mild concentric left ventricular hypertrophy. Left ventricular diastolic parameters are consistent with Grade II diastolic dysfunction (pseudonormalization). Right Ventricle: The right ventricular size is normal. No increase in  right ventricular wall thickness. Right ventricular systolic function is normal. There is normal pulmonary artery systolic pressure. The tricuspid regurgitant velocity is 1.30 m/s, and  with an assumed right atrial pressure of 3 mmHg, the estimated right ventricular systolic pressure is 9.8 mmHg. Left Atrium: Left atrial size was moderately dilated. Right Atrium: Right atrial size was mildly dilated. Pericardium: Trivial pericardial effusion is present. The pericardial effusion is anterior to the right ventricle. Presence of epicardial fat layer. Mitral Valve: The mitral valve was not well visualized. No evidence of mitral valve regurgitation. Tricuspid Valve: The tricuspid valve is grossly normal. Tricuspid valve regurgitation is not demonstrated. Aortic Valve: The aortic valve was not well visualized. Aortic valve regurgitation is not visualized. Pulmonic Valve: The pulmonic valve was not well visualized. Pulmonic valve regurgitation is not visualized. Aorta: The aortic root and ascending aorta are structurally normal, with no evidence of dilitation. IAS/Shunts: No atrial level shunt detected by color flow Doppler.  LEFT VENTRICLE PLAX 2D LVIDd:         6.40 cm   Diastology LVIDs:         4.90 cm   LV e' medial:    5.08 cm/s LV PW:         1.20 cm   LV E/e' medial:  16.4 LV IVS:        1.10 cm   LV e' lateral:   4.56 cm/s LVOT diam:     2.00 cm   LV E/e' lateral: 18.2 LV SV:         97 LV SV Index:   37 LVOT Area:     3.14 cm  RIGHT VENTRICLE RV S prime:     14.20 cm/s TAPSE (M-mode): 1.8 cm LEFT ATRIUM              Index        RIGHT ATRIUM           Index LA diam:        5.20 cm  1.98 cm/m   RA Area:     25.50 cm LA Vol (A2C):    119.0 ml 45.41 ml/m  RA Volume:   84.90 ml  32.40 ml/m LA Vol (A4C):   110.0 ml 41.97 ml/m LA Biplane Vol: 120.0 ml 45.79 ml/m  AORTIC VALVE LVOT Vmax:   127.00 cm/s LVOT Vmean:  85.100 cm/s LVOT VTI:    0.310 m  AORTA Ao Root diam: 2.80 cm Ao Asc diam:  3.30 cm MITRAL VALVE               TRICUSPID VALVE MV Area (PHT): 1.86 cm    TR Peak grad:   6.8 mmHg MV Decel Time: 408 msec    TR Vmax:        130.00 cm/s MV E velocity: 83.20 cm/s MV A velocity: 49.90 cm/s  SHUNTS MV E/A ratio:  1.67        Systemic VTI:  0.31 m                            Systemic Diam: 2.00 cm Rudean Haskell MD Electronically signed by Rudean Haskell MD Signature Date/Time: 01/08/2023/1:56:47 PM    Final    DG Chest 2 View  Result Date: 01/07/2023 CLINICAL DATA:  Shortness of breath. EXAM: CHEST - 2 VIEW COMPARISON:  Chest radiograph dated 01/07/2023. FINDINGS: There is mild cardiomegaly with mild vascular congestion. There are bibasilar atelectasis/scarring. No focal  consolidation, pleural effusion or pneumothorax. No acute osseous pathology. IMPRESSION: Mild cardiomegaly with mild vascular congestion. No focal consolidation. Electronically Signed   By: Anner Crete M.D.   On: 01/07/2023 20:19    Microbiology: Results for orders placed or performed during the hospital encounter of 03/25/20  SARS CORONAVIRUS 2 (TAT 6-24 HRS) Nasopharyngeal Nasopharyngeal Swab     Status: None   Collection Time: 03/25/20  9:01 PM   Specimen: Nasopharyngeal Swab  Result Value Ref Range Status   SARS Coronavirus 2 NEGATIVE NEGATIVE Final    Comment: (NOTE) SARS-CoV-2 target nucleic acids are NOT DETECTED. The SARS-CoV-2 RNA is generally detectable in upper and lower respiratory specimens during the acute phase of infection. Negative results do not preclude SARS-CoV-2 infection, do not rule out co-infections with other pathogens, and should not be used as the sole basis for treatment or other patient management  decisions. Negative results must be combined with clinical observations, patient history, and epidemiological information. The expected result is Negative. Fact Sheet for Patients: SugarRoll.be Fact Sheet for Healthcare Providers: https://www.woods-mathews.com/ This test is not yet approved or cleared by the [place] FDA and  has been authorized for detection and/or diagnosis of SARS-CoV-2 by FDA under an Emergency Use Authorization (EUA). This EUA will remain  in effect (meaning this test can be used) for the duration of the COVID-19 declaration under Section 56 4(b)(1) of the Act, 21 U.S.C. section 360bbb-3(b)(1), unless the authorization is terminated or revoked sooner. Performed at [facility], [place] [address] [place], [place] [address]     Labs: CBC: Recent Labs  Lab 01/07/23 1918 01/08/23 1600 01/09/23 0132 01/09/23 0650  WBC 14.5* 12.7* 12.3* 11.8*  HGB 10.8* 11.1* 10.3* 10.3*  HCT 32.3* 34.3* 30.4* 31.1*  MCV 81.6 82.7 80.6 82.1  PLT 376 390 342 [id number]   Basic Metabolic Panel: Recent Labs  Lab 01/09/23 0650 01/09/23 1527 01/10/23 0705 01/10/23 1648 01/11/23 0612  NA 137 137 139 135 136  K 2.9* 3.2* 3.2* 3.9 3.9  CL 101 101 102 100 100  CO2 '26 28 28 28 22  '$ GLUCOSE 152* 84 187* 361* 249*  BUN 27* 26* 28* 31* 37*  CREATININE 2.36* 2.32* 2.47* 2.63* 2.78*  CALCIUM 8.4* 8.4* 8.5* 8.4* 8.4*  MG 1.8 1.8 1.8 2.3 2.2   Liver Function Tests: No results for input(s): "AST", "ALT", "ALKPHOS", "BILITOT", "PROT", "ALBUMIN" in the last 168 hours. CBG: Recent Labs  Lab 01/10/23 1204 01/10/23 1631 01/10/23 2136 01/11/23 0613 01/11/23 1210  GLUCAP 309* 343* 134* 240* 369*    Discharge time spent: greater than 30 minutes.  Signed: Tawni Millers, MD [facility] 01/11/2023

## 2023-01-11 NOTE — Inpatient Diabetes Management (Signed)
Inpatient Diabetes Program Recommendations  AACE/ADA: New Consensus Statement on Inpatient Glycemic Control (2015)  Target Ranges:  Prepandial:   less than 140 mg/dL      Peak postprandial:   less than 180 mg/dL (1-2 hours)      Critically ill patients:  140 - 180 mg/dL   Lab Results  Component Value Date   GLUCAP 240 (H) 01/11/2023   HGBA1C 8.6 (H) 01/08/2023    Latest Reference Range & Units 01/10/23 06:18 01/10/23 12:04 01/10/23 16:31 01/10/23 21:36 01/11/23 06:13  Glucose-Capillary 70 - 99 mg/dL 193 (H) 309 (H) 343 (H) 134 (H) 240 (H)  (H): Data is abnormally high  Diabetes history: DM2 Outpatient Diabetes medications: Basaglar 38, Farxiga (not taking), Trulicity qd (not taking)  Current orders for Inpatient glycemic control: Semglee 10 units, Novolog 0-15 units tid  Inpatient Diabetes Program Recommendations:   Please consider: -Increase Semglee to 15 units -Add Novolog meal coverage 3 units tid if eats 50% if Postprandials >180.  Thank you, Nani Gasser. Aalijah Mims, RN, MSN, CDE  Diabetes Coordinator Inpatient Glycemic Control Team Team Pager [phone] (8am-5pm) 01/11/2023 12:14 PM

## 2023-01-11 NOTE — Research (Signed)
24 hr urine collection is finished  Continue with strict I&O's

## 2023-01-11 NOTE — Research (Signed)
24 urine orders

## 2023-01-11 NOTE — Progress Notes (Signed)
Patient and family given discharge instructions, medication list and follow up appointments. IV and tele had been dcd. Will be discharged home as ordered. There was not any more home medication left in pharmacy to return to patient and patient verbalized understanding of this. Transported to exit via wheel chair. Shantell Belongia, Bettina Gavia RN

## 2023-01-11 NOTE — Progress Notes (Signed)
Progress Note   Patient: Brendan [name]. [id number] DOB: 30-Sep-1956 DOA: 01/07/2023     3 DOS: the patient was seen and examined on 01/11/2023   Brief hospital course: Brendan [name] was admitted to the hospital with the working diagnosis of heart failure decompensation.   67 yo male with the past medical history of hypertension, T2DM, heart failure, obesity and CKD who presented with dyspnea. Reported worsening dyspnea for 3 weeks, along with lower extremity edema and 30 lbs weight gain. He was evaluated by his primary care provider and referred to the ED for further evaluation. On his initial physical examination his blood pressure was 182/82, HR 62, RR 19 and 02 saturation 99%, lungs with no wheezing or rales, heart with S1 and S2 present and rhythmic, abdomen with no distention and positive lower extremity edema.   Na 138, K 3,7 CL 103, bicarbonate 26 glucose 229 bun 26 cr 2,43  BNP 722.9 High sensitive troponin 24 and 24  Wbc 14.5 hgb 10,8 plt 376   Chest radiograph with cardiomegaly with bilateral hilar vascular congestion and bilateral cephalization of the vasculature, positive fluid in the right fissure.   EKG 63 bpm, left axis deviation, sinus rhythm with left atrial enlargement, no significant ST segment or T wave changes.   Patient was placed on IV furosemide for diuresis.   02/22 volume status is improving.  02/23 transitioned to oral torsemide.   Assessment and Plan: * Acute on chronic diastolic CHF (congestive heart failure) (HCC) Echocardiogram with preserved LV systolic function EF 55 to 60%, moderate dilatation of LV, mild LVH, RV systolic function preserved, LA with moderate dilatation.   Urine output is 99991111 ml Systolic blood pressure 99991111 to 134 mmHg.   Continue carvedilol and empagliflozin  Finerenone and entresto.  After load reduction with hydralazine  Transition to oral loop diuretic with oral torsemide.    Hypertension Continue blood pressure control with  amlodipine, entresto, hydralazine, carvedilol and fenrenone.  Change to oral loop diuretic   CKD (chronic kidney disease) stage 4, GFR 15-29 ml/min (HCC) Hypokalemia.  Renal function with serum cr at 2,78 with K at 3,9 and serum bicarbonate at 22. Na 136 and Mg 2,2  Patient transitioned to oral torsemide, follow up renal function in am.   Hypothyroidism Continue synthroid  Insulin-requiring or dependent type II diabetes mellitus ([place]) Hypoglycemia.   Fasting glucose is 249 today Resumed insulin sliding scale insulin.  Continue basal insulin at 10 units.   Obstructive sleep apnea Continue home CPAP        Subjective: Patient is feeling better, no chest pain, no dyspnea and no edema.   Physical Exam: Vitals:   01/10/23 1956 01/10/23 2352 01/11/23 0412 01/11/23 0816  BP: 131/70  (!) 114/56 134/64  Pulse: 60  (!) 59 66  Resp: '18 10 15 18  '$ Temp: 98.4 F (36.9 C) 97.8 F (36.6 C) 97.7 F (36.5 C) 98.1 F (36.7 C)  TempSrc: Oral Oral Oral Oral  SpO2: 98%  96% 95%  Weight:   132.7 kg   Height:       Neurology awake and alert ENT with no pallor Cardiovascular with S1 and S2 present and rhythmic with no gallops, rubs or murmurs Respiratory with no rales or wheezing, no rhonchi Abdomen with no distention  Data Reviewed:    Family Communication: no family at the bedside   Disposition: Status is: Inpatient Remains inpatient appropriate because: heart failure   Planned Discharge Destination: Home  Author: Tawni Millers, MD 01/11/2023 10:12 AM  For on call review [email or web address].

## 2023-01-11 NOTE — Progress Notes (Addendum)
Advanced Heart Failure Rounding Note  PCP-Cardiologist: None   Subjective:   Admitted with A/C HFpEF. Diuresing with IV lasix. Overall negative 14 liters. Weight down down 23 pounds.   Scr trending up 2.47>>2.63>>2.78   Feels better. No current resting dyspnea. Still mildly SOB w/ ambulation but overall much improved.    Objective:   Weight Range: 132.7 kg Body mass index is 37.55 kg/m.   Vital Signs:   Temp:  [97.5 F (36.4 C)-98.4 F (36.9 C)] 98.1 F (36.7 C) (02/23 0816) Pulse Rate:  [59-66] 66 (02/23 0816) Resp:  [10-18] 18 (02/23 0816) BP: (114-154)/(56-76) 134/64 (02/23 0816) SpO2:  [95 %-100 %] 95 % (02/23 0816) Weight:  [131 kg-132.7 kg] 132.7 kg (02/23 0412)    Weight change: Filed Weights   01/10/23 0421 01/10/23 1524 01/11/23 0412  Weight: 134.1 kg 131 kg 132.7 kg    Intake/Output:   Intake/Output Summary (Last 24 hours) at 01/11/2023 0925 Last data filed at 01/11/2023 [id number] Gross per 24 hour  Intake 340 ml  Output 4425 ml  Net -4085 ml      Physical Exam   General:  Well appearing, obese. No respiratory difficulty HEENT: normal Neck: supple. JVD not elevated. Carotids 2+ bilat; no bruits. No lymphadenopathy or thyromegaly appreciated. Cor: PMI nondisplaced. Regular rate & rhythm. No rubs, gallops or murmurs. Lungs: clear Abdomen: soft, nontender, nondistended. No hepatosplenomegaly. No bruits or masses. Good bowel sounds. Extremities: no cyanosis, clubbing, rash, trace b/l pretibial edema Neuro: alert & oriented x 3, cranial nerves grossly intact. moves all 4 extremities w/o difficulty. Affect pleasant.    Telemetry   NSR 60s   EKG    N/A   Labs    CBC Recent Labs    01/09/23 0132 01/09/23 0650  WBC 12.3* 11.8*  HGB 10.3* 10.3*  HCT 30.4* 31.1*  MCV 80.6 82.1  PLT 342 99991111   Basic Metabolic Panel Recent Labs    01/10/23 1648 01/11/23 0612  NA 135 136  K 3.9 3.9  CL 100 100  CO2 28 22  GLUCOSE 361* 249*  BUN 31*  37*  CREATININE 2.63* 2.78*  CALCIUM 8.4* 8.4*  MG 2.3 2.2   Liver Function Tests No results for input(s): "AST", "ALT", "ALKPHOS", "BILITOT", "PROT", "ALBUMIN" in the last 72 hours. No results for input(s): "LIPASE", "AMYLASE" in the last 72 hours. Cardiac Enzymes No results for input(s): "CKTOTAL", "CKMB", "CKMBINDEX", "TROPONINI" in the last 72 hours.  BNP: BNP (last 3 results) Recent Labs    01/07/23 1918  BNP 722.9*    ProBNP (last 3 results) No results for input(s): "PROBNP" in the last 8760 hours.   D-Dimer No results for input(s): "DDIMER" in the last 72 hours. Hemoglobin A1C No results for input(s): "HGBA1C" in the last 72 hours.  Fasting Lipid Panel No results for input(s): "CHOL", "HDL", "LDLCALC", "TRIG", "CHOLHDL", "LDLDIRECT" in the last 72 hours. Thyroid Function Tests No results for input(s): "TSH", "T4TOTAL", "T3FREE", "THYROIDAB" in the last 72 hours.  Invalid input(s): "FREET3"  Other results:   Imaging    No results found.   Medications:     Scheduled Medications:  amLODipine  10 mg Oral Daily   brimonidine  1 drop Both Eyes Q12H   And   timolol  1 drop Both Eyes Q12H   carvedilol  25 mg Oral BID WC   dapagliflozin propanediol  10 mg Oral Daily   enoxaparin (LOVENOX) injection  40 mg Subcutaneous Q24H   Finerenone  1 tablet Oral q morning   hydrALAZINE  100 mg Oral TID   insulin aspart  0-15 Units Subcutaneous TID WC   insulin glargine-yfgn  10 Units Subcutaneous Daily   levothyroxine  137 mcg Oral Q0600   sacubitril-valsartan  1 tablet Oral BID   simvastatin  20 mg Oral QPM   sodium chloride flush  3 mL Intravenous Q12H    Infusions:  sodium chloride      PRN Medications: sodium chloride, acetaminophen, dextrose, ondansetron (ZOFRAN) IV, sodium chloride flush    Patient Profile   Brendan [name] is a 67 year old with a history of obesity, DMII, Hyperthyroidism, CKD Stage IIIb, HTN, OSA on CPAP, and HFpEF.    Admitted with A/C  HFpEF.   Assessment/Plan   1. A/C HFpEF  Echo 2023 EF preserved Grade 1 DD. Echo EF 55-60% RV ok. Grade II DD.  Admitted with volume overload. Suspect volume overloaded in the setting of liberalized diet. BNP 722.  Screen for FASTR trial ---> Usual Care  - Good diuresis, down 23 lb.  - transition back to torsemide today, 80 mg once daily  - Continue farxiga - Continue finerenone 20 mg daily.  -Low sodium diet and limit fluids to < 2 liters per day.  -PYP scan as outpatient    2. HTN   -  Continue coreg 25 mg twice a day - Continue hydralazine 100 mg three times a day  - Continue amlodipine 10 mg daily.   - Continue Entresto 97-103 mg twice a day.   3. CKD Stage IV -Creatinine baseline 2.4-2.6 .  -SCr 2.7 today   -On farxiga 10 mg daily  -on finerenone 20 mg daily.  -Restart torsemide today, follow BMP  -Followed in the community by Dr Joelyn Oms    4. OSA -Continue nightly CPAP    5. DMII -On SSI  -Hgb A1C 8.6    6. Obesity  Body mass index is 37.55 kg/m. Dietitian appreciated.   Monitor urinary response and SCr w/ torsemide. If stable, anticipate home tomorrow.     SITE # 001       INTERVAL:  '[]'$  Screening / Baseline  '[]'$   24 Hrs - During Tx '[]'$  48 Hrs - During Tx   '[x]'$   72 Hrs - End of Tx '[]'$  Post Tx - Within 24 Hrs  '[]'$  Post Tx - 72 hours after Tx Initiation  '[]'$  Post Tx - 7 Days after Tx  Initiation  '[]'$  Discharge '[]'$  7-Day Follow-Up '[]'$  30 -Day Follow-UP  Exam Performed:  '[x]'$  Yes  '[]'$  No  Estimated pounds over dry weight:  3_ lbs  Is Subject experiencing Shortness of Breath?  '[x]'$  Yes   '[]'$   No  Dyspnea VAS: (10 being the worst)   '[]'$  1 '[]'$  2 '[x]'$  3 '[]'$  4 '[]'$  5 '[]'$  6 '[]'$  7 '[]'$ 8 '[]'$  9 '[]'$  10  Is Subject experiencing Orthopnea? '[]'$  Yes    '[x]'$  No  Is there evidence of Rales?      '[]'$  Yes  '[x]'$   No    If Yes, Characterize:  '[]'$  Rhonchi      '[]'$  Crackles      '[]'$  Wheezing      '[]'$  Stridor    Ascites? '[]'$   Yes  '[x]'$   No  Is there pleural effusion?  '[]'$  Yes  '[x]'$  No   '[]'$  Not  done   Leg Edema?  '[x]'$  Yes    '[]'$  No  If yes, grade of edema:  '[x]'$  1+  '[]'$  2+  '[]'$  3+  '[]'$  4+  Sacral Edema?  '[]'$  Yes   '[x]'$  No        If yes, grade of edema:    '[]'$  1+  '[]'$  2+  '[]'$  3+  '[]'$  4+  Jugular Vein Distention (JVD)?:  '[x]'$  Yes  '[]'$   No        If yes, provide measurement:   _7-8___ cm  NYHA Classification:  '[]'$  I   '[]'$  II  '[x]'$  III  '[]'$   IV  '[]'$   N/A  Has subject expressed presence of thirst?  '[]'$  Yes  '[x]'$   No       If Yes, rate of thirst level (10 being the worst): '[]'$  1 '[]'$  2 '[]'$  3 '[]'$  4 '[]'$  5 '[]'$  6 '[]'$  7 '[]'$ 8 '[]'$  9 '[]'$  10   ++++  PROVIDER NOTES OR COMMENTS:      Length of Stay: [address], PA-C  01/11/2023, 9:25 AM  Advanced Heart Failure Team Pager [phone] (M-F; 7a - 5p)  Please contact [facility] for night-coverage after hours (5p -7a ) and weekends on [email or web address]  Patient seen with PA, agree with the above note.   I/Os net negative -[phone].  Breathing improved.  Creatinine mildly higher at 2.78.  General: NAD Neck: Thick. No JVD, no thyromegaly or thyroid nodule.  Lungs: Clear to auscultation bilaterally with normal respiratory effort. CV: Nondisplaced PMI.  Heart regular S1/S2, no S3/S4, no murmur.  Trace ankle edema.  Abdomen: Soft, nontender, no hepatosplenomegaly, no distention.  Skin: Intact without lesions or rashes.  Neurologic: Alert and oriented x 3.  Psych: Normal affect. Extremities: No clubbing or cyanosis.  HEENT: Normal.   Creatinine higher today at 2.78, baseline around 2.5.  Looks euvolemic now.  He is now off IV Lasix.  - Will start torsemide 60 mg daily for home.   BP is now controlled, SBP 130s this morning.   I think he can go home today with close followup in CHF clinic for BMET => has appt Wednesday.  Cardiac meds for home: torsemide 60 daily, amlodipine 10, Coreg 25 bid, dapa 10, finerenone 20, hydralazine 100 tid, Entresto 97/103 bid.   Brendan [name] 01/11/2023 1:15 PM

## 2023-01-11 NOTE — Progress Notes (Signed)
CARDIAC REHAB PHASE I   PRE:  Rate/Rhythm: 87 SR  BP:  Sitting: 130/68     MODE:  Ambulation: 395 ft   POST:  Rate/Rhythm: 64 SR  BP:  Sitting: 133/61      SaO2: 99 RA  Pt tolerated exercise well and amb 395 ft  independently. Pt denies CP or dizziness, but did stop once for SOB  throughout walk. Pt left sitting on side of bed with call bell in reach.  Will continue to follow.  [id number] Vilinda Blanks, RRT, BSRT 01/11/2023 11:26 AM

## 2023-01-11 NOTE — Progress Notes (Signed)
[facility] patient please review.  Will report urine K+ and NA as an AE since they are elevated and do you consider them clinically significant or not clinically significant?

## 2023-01-11 NOTE — Care Management Important Message (Signed)
Important Message  Patient Details  Name: Brendan [name]. MRN: [id number] Date of Birth: [birth year]   Medicare Important Message Given:  Yes     Shelda Altes 01/11/2023, 8:59 AM

## 2023-01-12 LAB — CREATININE, URINE, 24 HOUR
Creatinine, 24H Ur: 1238 mg/24 hr (ref 1000–2000)
Creatinine, Urine: 20.3 mg/dL

## 2023-01-12 LAB — OSMOLALITY, URINE: Osmolality, Ur: 236 mOsmol/kg

## 2023-01-12 LAB — CHLORIDE, URINE, RANDOM: Chloride, Ur: 56 mmol/L

## 2023-01-12 LAB — SODIUM, URINE, 24 HOUR
Sodium, 24H Ur: 390 mmol/24 hr — ABNORMAL HIGH (ref 58–337)
Sodium, Ur: 64 mmol/L

## 2023-01-12 LAB — SPECIFIC GRAVITY, URINE: Specific Gravity, UA: 1.008 (ref 1.005–1.030)

## 2023-01-12 LAB — POTASSIUM, URINE, 24 HOUR
Potassium Urine: 20.7 mmol/L
Potassium, Urine: 126 mmol/24 hr — ABNORMAL HIGH (ref 20–116)

## 2023-01-14 LAB — CREATININE, URINE, 24 HOUR
Creatinine, 24H Ur: 962 mg/24 hr — ABNORMAL LOW (ref 1000–2000)
Creatinine, Urine: 26 mg/dL

## 2023-01-14 LAB — SODIUM, URINE, 24 HOUR
Sodium, 24H Ur: 318 mmol/24 hr (ref 58–337)
Sodium, Ur: 86 mmol/L

## 2023-01-14 LAB — POTASSIUM, URINE, 24 HOUR
Potassium Urine: 28.5 mmol/L
Potassium, Urine: 105 mmol/24 hr (ref 20–116)

## 2023-01-14 LAB — SPECIFIC GRAVITY, URINE: Specific Gravity, UA: 1.01 (ref 1.005–1.030)

## 2023-01-14 LAB — OSMOLALITY, URINE: Osmolality, Ur: 315 mOsmol/kg

## 2023-01-14 LAB — CHLORIDE, URINE, RANDOM: Chloride, Ur: 82 mmol/L

## 2023-01-14 NOTE — Progress Notes (Signed)
ADVANCED HF CLINIC NOTE  PCP: Dr. Delfina Redwood HF MD: Dr Haroldine Laws  HPI: Brendan [name] is 67 year old male with history of HFpEF, HTN, OSA on CPAP, DM2, hypothyroidism, CKD 3b (creatinine ~2.4) and obesity.   Admitted to [facility] [id number] with A/C diastolic heart failure. Diuresed with IV lasix and transitioned to torsemide 40 mg twice a day. Discharge weight 301 pounds.   Admitted 01/07/2023 with A/C HFpEF. High sodium diet played a role in volume overload. Diuresed with IV lasix. Overall diuresed 23 pounds. Transitioned to torsemide 80 mg daily. GDMT adjusted. Enrolled in FASTR trial usual care arm. Marland Kitchen Discharge weight 294 pounds.   He saw PCP on Monday. He was told to take hydralazine to 100 mg daily and cut back torsemide to 20 mg daily (due to elevated creatinine). 2/23//24 Creatinine 3.3  BUN 58 K 3.9.   Today he returns for HF follow up with his wife. Overall feeling fine. Denies PND/Orthopnea. SOB after walking to the end of his driveway. Walking around 1500 steps. Appetite ok. No fever or chills. Weight at home 293-295 pounds. Taking all medications. He is working as a Administrator.   Echo 2024 EF 55-60% RV ok. Grade II DD.  Echo 2023 EF preserved Grade 1 DD.  ECHO 03/2020 EF 55-60% Grade II DD. Moderate LVH , RV normal    Past Medical History:  Diagnosis Date   Acanthosis nigricans    Atopic dermatitis    Diabetes ([place]) 2004   Erectile dysfunction    Fatty liver 2007   Glaucoma 2008   Gynecomastia 2009   Hyperaldosteronism ([place]) 2000   Hypercholesterolemia    Hyperlipidemia    Hypertension    Hypoglycemic reaction    Hypothyroidism 2004   Incontinence    Intermittent vertigo 2012   Left cervical radiculopathy 2012   Lumbar radiculopathy    Obesity    Peripheral neuropathy    Pollen allergies 2007   perennial   Prostate cancer ([place]) 2006   Reflux esophagitis 1995   Sickle cell trait ([place]) 2006   Sleep apnea, obstructive 2000   uses a cpap   Venous insufficiency 2005    Vitamin D deficiency 2012    Current Outpatient Medications  Medication Sig Dispense Refill   acetaminophen (TYLENOL) 500 MG tablet Take 500 mg by mouth every 6 (six) hours as needed for mild pain.     amLODipine (NORVASC) 10 MG tablet Take 1 tablet (10 mg total) by mouth daily. 30 tablet 0   Blood Glucose Monitoring Suppl (ONE TOUCH ULTRA SYSTEM KIT) W/DEVICE KIT 1 kit by Does not apply route once. One touch ultrasoft lancets     brimonidine-timolol (COMBIGAN) 0.2-0.5 % ophthalmic solution Place 1 drop into both eyes every 12 (twelve) hours.     carvedilol (COREG) 25 MG tablet Take 1 tablet (25 mg total) by mouth 2 (two) times daily. 180 tablet 1   dapagliflozin propanediol (FARXIGA) 10 MG TABS tablet Take 1 tablet (10 mg total) by mouth daily. 30 tablet 0   HUMALOG KWIKPEN 200 UNIT/ML KwikPen Inject 10-22 Units into the skin 3 (three) times daily. PLUS 4 extra units if blood sugar over 200 mg/dL     hydrALAZINE (APRESOLINE) 100 MG tablet Patient takes 1 tablet by mouth daily.     Insulin Glargine (BASAGLAR KWIKPEN) 100 UNIT/ML Inject 38 Units into the skin daily.     KERENDIA 20 MG TABS Take 1 tablet by mouth every morning.     levothyroxine (  SYNTHROID) 137 MCG tablet Take 137 mcg by mouth every morning.     sacubitril-valsartan (ENTRESTO) 97-103 MG Take 1 tablet by mouth 2 (two) times daily. 60 tablet 0   simvastatin (ZOCOR) 20 MG tablet Take 20 mg by mouth every evening.     torsemide (DEMADEX) 20 MG tablet Take 20 mg by mouth daily.     No current facility-administered medications for this encounter.    Allergies  Allergen Reactions   Ace Inhibitors Cough   Maxidex [Dexamethasone] Other (See Comments)    Sexual dysfunction   Verapamil Other (See Comments)    constipation      Social History   Socioeconomic History   Marital status: Married    Spouse name: Not on file   Number of children: 3   Years of education: Not on file   Highest education level: Not on file   Occupational History   Occupation: truck driver    Comment: Retired Economist  Tobacco Use   Smoking status: Never   Smokeless tobacco: Never  Vaping Use   Vaping Use: Never used  Substance and Sexual Activity   Alcohol use: Yes    Comment: one drink every few months   Drug use: No   Sexual activity: Not on file  Other Topics Concern   Not on file  Social History Narrative   Right handed   Lives in a two story home    Drinks no caffeine    Social Determinants of Health   Financial Resource Strain: Low Risk  (01/08/2023)   Overall Financial Resource Strain (CARDIA)    Difficulty of Paying Living Expenses: Not hard at all  Food Insecurity: No Food Insecurity (01/08/2023)   Hunger Vital Sign    Worried About Running Out of Food in the Last Year: Never true    [place] in the Last Year: Never true  Transportation Needs: No Transportation Needs (01/08/2023)   PRAPARE - Hydrologist (Medical): No    Lack of Transportation (Non-Medical): No  Physical Activity: Not on file  Stress: Not on file  Social Connections: Not on file  Intimate Partner Violence: Not on file      Family History  Problem Relation Age of Onset   Hypertension Father        deceased age 67   Heart attack Father    Heart failure Mother    COPD Sister    CVA Sister    Diabetes Daughter    Colon cancer Neg Hx    Stomach cancer Neg Hx     Vitals:   01/16/23 1353  BP: (!) 144/70  Pulse: (!) 56  SpO2: 97%  Weight: (!) 136.3 kg (300 lb 6.4 oz)    Wt Readings from Last 3 Encounters:  01/16/23 135.4 kg (298 lb 9.6 oz)  01/16/23 (!) 136.3 kg (300 lb 6.4 oz)  01/11/23 133.4 kg (294 lb 1.6 oz)    PHYSICAL EXAM: General:  Well appearing. No resp difficulty> walking  HEENT: normal Neck: supple. no JVD. Carotids 2+ bilat; no bruits. No lymphadenopathy or thryomegaly appreciated. Cor: PMI nondisplaced. Regular rate & rhythm. No rubs, gallops or  murmurs. Lungs: clear Abdomen: soft, nontender, nondistended. No hepatosplenomegaly. No bruits or masses. Good bowel sounds. Extremities: no cyanosis, clubbing, rash, R and LLE trace edema Neuro: alert & orientedx3, cranial nerves grossly intact. moves all 4 extremities w/o difficulty. Affect pleasant  ASSESSMENT & PLAN: 1. Chronic Diastolic Heart  Failure, - ECHO 03/2020 EF preserved EF LVH Grade II DD - Echo 2024 EF 55-60% RV ok. Grade II DD.  - Chronic NYHA II.  - Volume status stable. Continue torsemide 20  mg daily. Instructed to take extra 20 mg torsemide for weight 298 or greater  -Continue entresto 97-103 mg twice a day.  - Continue farxiga 10 mg dialy  - Check BMET  - Discussed low salt diet and limit fluid intake to < 2 liters per day.   2.HTN  - Continue current dose of amlodipine, entresto, carvedilol.  - Switch hydralazine to 50 mg twice a day. Given pill cutter.   - Follows with Dr. Oval Linsey in HTN program  3. DMII -Hgb A1C 8.6  - Followed by Endocrinology   4. OSA - Continue CPAP every night. .   5. Obesity  Body mass index is 38.57 kg/m.   6. CKD Stage IV - Creatinine baseline 2.4-2.6 - Followed by Dr Joelyn Oms  - Check BMET   Check CBC, BMET, and Mag   Follow up in 4 weeks with APP.      SITE # 001       INTERVAL:  '[]'$  Screening / Baseline  '[]'$   24 Hrs - During Tx '[]'$  48 Hrs - During Tx   '[]'$   72 Hrs - End of Tx '[]'$  Post Tx - Within 24 Hrs  '[]'$  Post Tx - 72 hours after Tx Initiation  '[]'$  Post Tx - 7 Days after Tx  Initiation  '[]'$  Discharge '[x]'$  7-Day Follow-Up '[]'$  30 -Day Follow-UP  Exam Performed:  '[x]'$  Yes  '[]'$  No  Estimated pounds over dry weight:  ____0_______ lbs  Is Subject experiencing Shortness of Breath?  '[]'$  Yes   '[x]'$   No  Dyspnea VAS: (10 being the worst)   '[x]'$  1 '[]'$  2 '[]'$  3 '[]'$  4 '[]'$  5 '[]'$  6 '[]'$  7 '[]'$ 8 '[]'$  9 '[]'$  10  Is Subject experiencing Orthopnea? '[]'$  Yes    '[x]'$  No  Is there evidence of Rales?      '[]'$  Yes  '[x]'$   No    If Yes, Characterize:   '[]'$  Rhonchi      '[]'$  Crackles      '[]'$  Wheezing      '[]'$  Stridor    Ascites? '[]'$   Yes  '[x]'$   No  Is there pleural effusion?  '[]'$  Yes  '[x]'$  No   '[]'$  Not done   Leg Edema?  '[]'$  Yes    '[x]'$  No         If yes, grade of edema:  '[]'$  1+  '[]'$  2+  '[]'$  3+  '[]'$  4+  Sacral Edema?  '[]'$  Yes   '[x]'$  No        If yes, grade of edema:    '[]'$  1+  '[]'$  2+  '[]'$  3+  '[]'$  4+  Jugular Vein Distention (JVD)?:  '[]'$  Yes  '[x]'$   No        If yes, provide measurement:   ____ cm  NYHA Classification:  '[]'$  I   '[x]'$  II  '[]'$  III  '[]'$   IV  '[]'$   N/A  Has subject expressed presence of thirst?  '[]'$  Yes  '[x]'$   No       If Yes, rate of thirst level (10 being the worst): '[]'$  1 '[]'$  2 '[]'$  3 '[]'$  4 '[]'$  5 '[]'$  6 '[]'$  7 '[]'$ 8 '[]'$  9 '[]'$  10   ++++  PROVIDER NOTES OR COMMENTS:  Brendan Dubas NP-C  2:09 PM

## 2023-01-16 ENCOUNTER — Other Ambulatory Visit (HOSPITAL_COMMUNITY): Payer: Self-pay

## 2023-01-16 ENCOUNTER — Encounter (HOSPITAL_COMMUNITY): Payer: Self-pay

## 2023-01-16 ENCOUNTER — Encounter: Payer: Medicare Other | Admitting: *Deleted

## 2023-01-16 ENCOUNTER — Ambulatory Visit (HOSPITAL_COMMUNITY)
Admit: 2023-01-16 | Discharge: 2023-01-16 | Disposition: A | Discharge status: Home / Self Care | Payer: Medicare Other | Attending: Family Medicine | Admitting: Family Medicine

## 2023-01-16 VITALS — BP 142/71 | HR 55 | Temp 98.2°F | Resp 18 | Wt 298.6 lb

## 2023-01-16 VITALS — BP 144/70 | HR 56 | Wt 300.4 lb

## 2023-01-16 DIAGNOSIS — E039 Hypothyroidism, unspecified: Secondary | ICD-10-CM | POA: Diagnosis not present

## 2023-01-16 DIAGNOSIS — I5032 Chronic diastolic (congestive) heart failure: Secondary | ICD-10-CM | POA: Insufficient documentation

## 2023-01-16 DIAGNOSIS — I130 Hypertensive heart and chronic kidney disease with heart failure and stage 1 through stage 4 chronic kidney disease, or unspecified chronic kidney disease: Secondary | ICD-10-CM | POA: Insufficient documentation

## 2023-01-16 DIAGNOSIS — Z7984 Long term (current) use of oral hypoglycemic drugs: Secondary | ICD-10-CM | POA: Insufficient documentation

## 2023-01-16 DIAGNOSIS — E1142 Type 2 diabetes mellitus with diabetic polyneuropathy: Secondary | ICD-10-CM | POA: Diagnosis not present

## 2023-01-16 DIAGNOSIS — Z006 Encounter for examination for normal comparison and control in clinical research program: Secondary | ICD-10-CM

## 2023-01-16 DIAGNOSIS — I10 Essential (primary) hypertension: Secondary | ICD-10-CM

## 2023-01-16 DIAGNOSIS — E669 Obesity, unspecified: Secondary | ICD-10-CM | POA: Diagnosis not present

## 2023-01-16 DIAGNOSIS — G4733 Obstructive sleep apnea (adult) (pediatric): Secondary | ICD-10-CM | POA: Insufficient documentation

## 2023-01-16 DIAGNOSIS — Z6838 Body mass index (BMI) 38.0-38.9, adult: Secondary | ICD-10-CM | POA: Diagnosis not present

## 2023-01-16 DIAGNOSIS — N184 Chronic kidney disease, stage 4 (severe): Secondary | ICD-10-CM | POA: Diagnosis not present

## 2023-01-16 DIAGNOSIS — E1122 Type 2 diabetes mellitus with diabetic chronic kidney disease: Secondary | ICD-10-CM | POA: Insufficient documentation

## 2023-01-16 DIAGNOSIS — Z79899 Other long term (current) drug therapy: Secondary | ICD-10-CM | POA: Diagnosis not present

## 2023-01-16 LAB — CBC
HCT: 32.9 % — ABNORMAL LOW (ref 39.0–52.0)
Hemoglobin: 10.9 g/dL — ABNORMAL LOW (ref 13.0–17.0)
MCH: 26.8 pg (ref 26.0–34.0)
MCHC: 33.1 g/dL (ref 30.0–36.0)
MCV: 81 fL (ref 80.0–100.0)
Platelets: 354 10*3/uL (ref 150–400)
RBC: 4.06 MIL/uL — ABNORMAL LOW (ref 4.22–5.81)
RDW: 14.4 % (ref 11.5–15.5)
WBC: 9.9 10*3/uL (ref 4.0–10.5)
nRBC: 0 % (ref 0.0–0.2)

## 2023-01-16 LAB — BASIC METABOLIC PANEL
Anion gap: 12 (ref 5–15)
BUN: 64 mg/dL — ABNORMAL HIGH (ref 8–23)
CO2: 23 mmol/L (ref 22–32)
Calcium: 8.5 mg/dL — ABNORMAL LOW (ref 8.9–10.3)
Chloride: 99 mmol/L (ref 98–111)
Creatinine, Ser: 3.45 mg/dL — ABNORMAL HIGH (ref 0.61–1.24)
GFR, Estimated: 19 mL/min — ABNORMAL LOW (ref >60)
Glucose, Bld: 296 mg/dL — ABNORMAL HIGH (ref 70–99)
Potassium: 3.6 mmol/L (ref 3.5–5.1)
Sodium: 134 mmol/L — ABNORMAL LOW (ref 135–145)

## 2023-01-16 LAB — MAGNESIUM: Magnesium: 2.9 mg/dL — ABNORMAL HIGH (ref 1.7–2.4)

## 2023-01-16 MED ORDER — HYDRALAZINE HCL 100 MG PO TABS
50.0000 mg | ORAL_TABLET | Freq: Two times a day (BID) | ORAL | 3 refills | Status: DC
  Filled 2023-07-02: qty 90, 90d supply, fill #0
  Filled 2023-08-25 – 2023-08-28 (×2): qty 90, 90d supply, fill #1

## 2023-01-16 NOTE — Patient Instructions (Signed)
Take Hydralazine 50 mg twice daily (1/2 tablet twice daily). Provided with pill cutter. Take extra Torsemide 20 mg on days your weight is 298 lbs or greater. Return to Heart Failure APP clinic in 4 weeks.  Please call Heart Failure Clinic at [phone] if any questions or concerns.

## 2023-01-16 NOTE — Progress Notes (Signed)
Discharge labs  Seeing patient back 01/16/2023  Please review and document not clinically significant or clinically significant for abnormal labs

## 2023-01-16 NOTE — Progress Notes (Signed)
Please review and sign off on 24 hr urine this is done per study thanks Joelene Millin :) )

## 2023-01-16 NOTE — Progress Notes (Signed)
Please review and sign off on 24 hour urine  Thanks

## 2023-01-16 NOTE — Research (Signed)
[facility] post hospital 7 days follow up       SITE #  001          SUBJECT #  [id number]           7-DAY FOLLOW UP VISIT    Were there any changes to subjects's medications?  []  YES  [x]   NO     Updates:______  Did the subject experience any NEW adverse events?  []  YES  [x]   NO     Update:____  Was Auditory Testing performed at this visit?  [x]   YES  []   NO      If No, provide reason: ____  Number of home days between discharge and the 7 day follow up visit: 5 days   Labs drawn today   KCCQ completed   Dyspnea Score:  How much difficulty are you having in breathing now? Not short of breath   Compared to how much difficulty you were having with your breathing just before study drug was started, how is your breathing now? Markedly better

## 2023-01-25 ENCOUNTER — Telehealth: Payer: Self-pay

## 2023-01-25 ENCOUNTER — Telehealth (HOSPITAL_COMMUNITY): Payer: Self-pay

## 2023-01-25 DIAGNOSIS — I5032 Chronic diastolic (congestive) heart failure: Secondary | ICD-10-CM

## 2023-01-25 NOTE — Telephone Encounter (Signed)
Patient scheduled for labs on 01/28/23 at 9 am

## 2023-01-25 NOTE — Telephone Encounter (Signed)
Patient called and stated that he was up 17 lbs since last visit, SOB and not feeling well.   Contacted Heart Failure Clinic, per Dr Aundra Dubin patient is to take 40 mg Torsemide daily and repeat lab work next week. Patient will come in on Monday for labs.  Advised patient to come to ER if symptoms are unchanged or worsened.

## 2023-01-28 ENCOUNTER — Ambulatory Visit (HOSPITAL_COMMUNITY)
Admission: RE | Admit: 2023-01-28 | Discharge: 2023-01-28 | Disposition: A | Discharge status: Home / Self Care | Payer: Medicare Other | Source: Ambulatory Visit | Attending: Cardiology | Admitting: Cardiology

## 2023-01-28 DIAGNOSIS — I5032 Chronic diastolic (congestive) heart failure: Secondary | ICD-10-CM | POA: Diagnosis not present

## 2023-01-28 LAB — BASIC METABOLIC PANEL
Anion gap: 9 (ref 5–15)
BUN: 44 mg/dL — ABNORMAL HIGH (ref 8–23)
CO2: 26 mmol/L (ref 22–32)
Calcium: 8.7 mg/dL — ABNORMAL LOW (ref 8.9–10.3)
Chloride: 103 mmol/L (ref 98–111)
Creatinine, Ser: 2.91 mg/dL — ABNORMAL HIGH (ref 0.61–1.24)
GFR, Estimated: 23 mL/min — ABNORMAL LOW (ref >60)
Glucose, Bld: 150 mg/dL — ABNORMAL HIGH (ref 70–99)
Potassium: 3.9 mmol/L (ref 3.5–5.1)
Sodium: 138 mmol/L (ref 135–145)

## 2023-01-30 DIAGNOSIS — D631 Anemia in chronic kidney disease: Secondary | ICD-10-CM | POA: Diagnosis not present

## 2023-01-30 DIAGNOSIS — E1122 Type 2 diabetes mellitus with diabetic chronic kidney disease: Secondary | ICD-10-CM | POA: Diagnosis not present

## 2023-01-30 DIAGNOSIS — N1832 Chronic kidney disease, stage 3b: Secondary | ICD-10-CM | POA: Diagnosis not present

## 2023-01-30 DIAGNOSIS — I129 Hypertensive chronic kidney disease with stage 1 through stage 4 chronic kidney disease, or unspecified chronic kidney disease: Secondary | ICD-10-CM | POA: Diagnosis not present

## 2023-01-30 DIAGNOSIS — N189 Chronic kidney disease, unspecified: Secondary | ICD-10-CM | POA: Diagnosis not present

## 2023-01-30 DIAGNOSIS — N2581 Secondary hyperparathyroidism of renal origin: Secondary | ICD-10-CM | POA: Diagnosis not present

## 2023-02-08 NOTE — Progress Notes (Signed)
ADVANCED HF CLINIC NOTE  PCP: Dr. Delfina Redwood HF Cardiologist: Dr. Haroldine Laws  HPI: Mr Haneline is a 67 y.o. male with history of HFpEF, HTN, OSA on CPAP, DM2, hypothyroidism, CKD 3b (creatinine ~2.4) and obesity.   Admitted to [facility] [id number] with A/C diastolic heart failure. Diuresed with IV lasix and transitioned to torsemide 40 bid. Discharge weight 301 pounds.   Admitted 01/07/2023 with A/C HFpEF. High sodium diet played a role in volume overload. Diuresed with IV lasix. Overall diuresed 23 pounds. Transitioned to torsemide 80 mg daily. GDMT adjusted. Enrolled in FASTR trial usual care arm. Discharge weight 294 pounds.   PCP follow up,was told to take hydralazine to 100 mg daily and cut back torsemide to 20 mg daily (due to elevated creatinine). 01/11/23 SCr 3.3, BUN 58, K 3.9.   Follow up 2/24, chronic NYHA II and volume stable on torsemide 20 mg daily. Weight 300 lbs.   Today he returns for HF follow up. Overall feeling fine. SCr elevated last visit and torsemide decreased to 20 mg daily. He saw Nephrology a week later and was told to increase torsemide to 40 mg daily due to fluid overload. He has SOB walking further distances on flat ground. He has swelling in ankles. Rare atypical chest pain. Denies palpitations,  dizziness, or PND/Orthopnea. Appetite ok. No fever or chills. Weight at home 314 pounds. Out of Entresto for a few days, wears CPAP. He previously worked as a Administrator.  Cardiac Studies - Echo (2024): EF 55-60% RV ok. Grade II DD.  - Echo (2023): EF preserved Grade 1 DD.  - Echo (5/21): EF 55-60% Grade II DD. Moderate LVH , RV normal   Past Medical History:  Diagnosis Date   Acanthosis nigricans    Atopic dermatitis    Diabetes ([place]) 2004   Erectile dysfunction    Fatty liver 2007   Glaucoma 2008   Gynecomastia 2009   Hyperaldosteronism ([place]) 2000   Hypercholesterolemia    Hyperlipidemia    Hypertension    Hypoglycemic reaction    Hypothyroidism 2004   Incontinence     Intermittent vertigo 2012   Left cervical radiculopathy 2012   Lumbar radiculopathy    Obesity    Peripheral neuropathy    Pollen allergies 2007   perennial   Prostate cancer ([place]) 2006   Reflux esophagitis 1995   Sickle cell trait ([place]) 2006   Sleep apnea, obstructive 2000   uses a cpap   Venous insufficiency 2005   Vitamin D deficiency 2012   Current Outpatient Medications  Medication Sig Dispense Refill   acetaminophen (TYLENOL) 500 MG tablet Take 500 mg by mouth every 6 (six) hours as needed for mild pain.     amLODipine (NORVASC) 10 MG tablet Take 5 mg by mouth daily.     Blood Glucose Monitoring Suppl (ONE TOUCH ULTRA SYSTEM KIT) W/DEVICE KIT 1 kit by Does not apply route once. One touch ultrasoft lancets     brimonidine-timolol (COMBIGAN) 0.2-0.5 % ophthalmic solution Place 1 drop into both eyes every 12 (twelve) hours.     carvedilol (COREG) 25 MG tablet Take 1 tablet (25 mg total) by mouth 2 (two) times daily. 180 tablet 1   dapagliflozin propanediol (FARXIGA) 10 MG TABS tablet Take 1 tablet (10 mg total) by mouth daily. 30 tablet 0   HUMALOG KWIKPEN 200 UNIT/ML KwikPen Inject 10-22 Units into the skin 3 (three) times daily. PLUS 4 extra units if blood sugar over 200 mg/dL  hydrALAZINE (APRESOLINE) 100 MG tablet Take 0.5 tablets (50 mg total) by mouth 2 (two) times daily. 90 tablet 3   Insulin Glargine (BASAGLAR KWIKPEN) 100 UNIT/ML Inject 38 Units into the skin daily.     KERENDIA 20 MG TABS Take 1 tablet by mouth every morning.     levothyroxine (SYNTHROID) 137 MCG tablet Take 137 mcg by mouth every morning.     simvastatin (ZOCOR) 20 MG tablet Take 20 mg by mouth every evening.     torsemide (DEMADEX) 20 MG tablet Patient takes 2 tablets by mouth daily.     sacubitril-valsartan (ENTRESTO) 97-103 MG Take 1 tablet by mouth 2 (two) times daily. (Patient not taking: Reported on 02/15/2023)     No current facility-administered medications for this encounter.   Allergies   Allergen Reactions   Ace Inhibitors Cough   Maxidex [Dexamethasone] Other (See Comments)    Sexual dysfunction   Verapamil Other (See Comments)    constipation   Social History   Socioeconomic History   Marital status: Married    Spouse name: Not on file   Number of children: 3   Years of education: Not on file   Highest education level: Not on file  Occupational History   Occupation: truck driver    Comment: Retired Economist  Tobacco Use   Smoking status: Never   Smokeless tobacco: Never  Vaping Use   Vaping Use: Never used  Substance and Sexual Activity   Alcohol use: Yes    Comment: one drink every few months   Drug use: No   Sexual activity: Not on file  Other Topics Concern   Not on file  Social History Narrative   Right handed   Lives in a two story home    Drinks no caffeine    Social Determinants of Health   Financial Resource Strain: Low Risk  (01/08/2023)   Overall Financial Resource Strain (CARDIA)    Difficulty of Paying Living Expenses: Not hard at all  Food Insecurity: No Food Insecurity (01/08/2023)   Hunger Vital Sign    Worried About Running Out of Food in the Last Year: Never true    [place] in the Last Year: Never true  Transportation Needs: No Transportation Needs (01/08/2023)   PRAPARE - Hydrologist (Medical): No    Lack of Transportation (Non-Medical): No  Physical Activity: Not on file  Stress: Not on file  Social Connections: Not on file  Intimate Partner Violence: Not on file   Family History  Problem Relation Age of Onset   Hypertension Father        deceased age 21   Heart attack Father    Heart failure Mother    COPD Sister    CVA Sister    Diabetes Daughter    Colon cancer Neg Hx    Stomach cancer Neg Hx    BP 130/70   Pulse (!) 56   Wt (!) 143.5 kg (316 lb 6.4 oz)   SpO2 99%   BMI 40.62 kg/m   Wt Readings from Last 3 Encounters:  02/15/23 (!) 143.5 kg (316 lb 6.4 oz)   01/16/23 (!) 136.3 kg (300 lb 6.4 oz)  01/16/23 135.4 kg (298 lb 9.6 oz)   PHYSICAL EXAM: General:  NAD. No resp difficulty, walked into clinic HEENT: Normal Neck: Supple. JVP 7-8, thick neck. Carotids 2+ bilat; no bruits. No lymphadenopathy or thryomegaly appreciated. Cor: PMI nondisplaced. Regular rate &  rhythm. No rubs, gallops or murmurs. Lungs: Clear Abdomen: Soft, obese, nontender, nondistended. No hepatosplenomegaly. No bruits or masses. Good bowel sounds. Extremities: No cyanosis, clubbing, rash, 1-2+ BLE edema to knees. Neuro: Alert & oriented x 3, cranial nerves grossly intact. Moves all 4 extremities w/o difficulty. Affect pleasant.  REDs: 37%  ASSESSMENT & PLAN: 1. Chronic Diastolic Heart Failure, - Echo 03/2020 EF preserved EF LVH Grade II DD - Echo 2024 EF 55-60% RV ok. Grade II DD.  - NYHA II-III. Volume up today, weight up 16 lbs, GDMT limited by CKD. - Restart Entresto but at lower dose of 49/51 mg bid. - Increase torsemide to 80 mg daily x 3 days, then torsemide 60 mg daily. - Continue Coreg 25 mg bid. - Continue finerenone 20 mg daily. - Continue Farxiga 10 mg daily.  - Labs today. Repeat BMET in 10 days.  2. HTN  - Mildly elevated today. - Restart Entresto 49/51 bid. - Follows with Dr. Oval Linsey in HTN program  3. DMII - Hgb A1C 8.6  - On SGLT2i - Followed by Endocrinology   4. OSA - Continue CPAP nightly.   5. Obesity  - Body mass index is 40.62 kg/m.  - Consider referral to PharmD for [place]  6. CKD Stage IV - Baseline SCr 2.4-2.6 - Followed by Dr Joelyn Oms.  - Labs today.  Follow up in 3 weeks with APP and 4 months with Dr. Joyce Gross, FNP-BC 02/15/23    SITE # 001      INTERVAL:  []  Screening / Baseline  []   24 Hrs - During Tx []  48 Hrs - During Tx   []   72 Hrs - End of Tx []  Post Tx - Within 24 Hrs  []  Post Tx - 72 hours after Tx Initiation  []  Post Tx - 7 Days after Tx  Initiation  []  Discharge []  7-Day  Follow-Up [x]  30 -Day Follow-UP  Exam Performed:  [x]  Yes  []  No  Estimated pounds over dry weight:  _____16______ lbs  Is Subject experiencing Shortness of Breath?  [x]  Yes   []   No  Dyspnea VAS: (10 being the worst)   []  1 []  2 []  3 []  4 []  5 [x]  6 []  7 [] 8 []  9 []  10  Is Subject experiencing Orthopnea? []  Yes    [x]  No  Is there evidence of Rales?      []  Yes  [x]   No    If Yes, Characterize:  []  Rhonchi      []  Crackles      []  Wheezing      []  Stridor    Ascites? []   Yes  [x]   No  Is there pleural effusion?  []  Yes  []  No   [x]  Not done   Leg Edema?  [x]  Yes    []  No         If yes, grade of edema:  [x]  1+  []  2+  []  3+  []  4+  Sacral Edema?  []  Yes   [x]  No        If yes, grade of edema:    []  1+  []  2+  []  3+  []  4+  Jugular Vein Distention (JVD)?:  [x]  Yes  []   No        If yes, provide measurement:   _7-8___ cm  NYHA Classification:  []  I   []  II  [x]  III  []   IV  []   N/A  Has subject expressed presence of thirst?  []  Yes  [x]   No       If Yes, rate of thirst level (10 being the worst): []  1 []  2 []  3 []  4 []  5 []  6 []  7 [] 8 []  9 []  10   Agree with assessment above.   Arvilla Meres, MD  5:47 PM

## 2023-02-14 DIAGNOSIS — I129 Hypertensive chronic kidney disease with stage 1 through stage 4 chronic kidney disease, or unspecified chronic kidney disease: Secondary | ICD-10-CM | POA: Diagnosis not present

## 2023-02-15 ENCOUNTER — Encounter (HOSPITAL_COMMUNITY): Payer: Self-pay

## 2023-02-15 ENCOUNTER — Ambulatory Visit (HOSPITAL_COMMUNITY)
Admission: RE | Admit: 2023-02-15 | Discharge: 2023-02-15 | Disposition: A | Discharge status: Home / Self Care | Payer: Medicare Other | Source: Ambulatory Visit | Attending: Family Medicine | Admitting: Family Medicine

## 2023-02-15 ENCOUNTER — Encounter: Payer: Medicare Other | Admitting: *Deleted

## 2023-02-15 VITALS — BP 130/70 | HR 56 | Wt 316.4 lb

## 2023-02-15 VITALS — BP 129/68 | HR 58 | Temp 98.1°F | Resp 16 | Wt 314.3 lb

## 2023-02-15 DIAGNOSIS — Z006 Encounter for examination for normal comparison and control in clinical research program: Secondary | ICD-10-CM

## 2023-02-15 DIAGNOSIS — E118 Type 2 diabetes mellitus with unspecified complications: Secondary | ICD-10-CM

## 2023-02-15 DIAGNOSIS — I5032 Chronic diastolic (congestive) heart failure: Secondary | ICD-10-CM

## 2023-02-15 DIAGNOSIS — I5023 Acute on chronic systolic (congestive) heart failure: Secondary | ICD-10-CM

## 2023-02-15 DIAGNOSIS — I10 Essential (primary) hypertension: Secondary | ICD-10-CM | POA: Diagnosis not present

## 2023-02-15 DIAGNOSIS — G4733 Obstructive sleep apnea (adult) (pediatric): Secondary | ICD-10-CM | POA: Diagnosis not present

## 2023-02-15 DIAGNOSIS — N184 Chronic kidney disease, stage 4 (severe): Secondary | ICD-10-CM

## 2023-02-15 LAB — CBC
HCT: 33.4 % — ABNORMAL LOW (ref 39.0–52.0)
Hemoglobin: 10.9 g/dL — ABNORMAL LOW (ref 13.0–17.0)
MCH: 27.3 pg (ref 26.0–34.0)
MCHC: 32.6 g/dL (ref 30.0–36.0)
MCV: 83.5 fL (ref 80.0–100.0)
Platelets: 277 10*3/uL (ref 150–400)
RBC: 4 MIL/uL — ABNORMAL LOW (ref 4.22–5.81)
RDW: 14.6 % (ref 11.5–15.5)
WBC: 8.9 10*3/uL (ref 4.0–10.5)
nRBC: 0 % (ref 0.0–0.2)

## 2023-02-15 LAB — BASIC METABOLIC PANEL
Anion gap: 11 (ref 5–15)
BUN: 44 mg/dL — ABNORMAL HIGH (ref 8–23)
CO2: 25 mmol/L (ref 22–32)
Calcium: 8.6 mg/dL — ABNORMAL LOW (ref 8.9–10.3)
Chloride: 102 mmol/L (ref 98–111)
Creatinine, Ser: 2.88 mg/dL — ABNORMAL HIGH (ref 0.61–1.24)
GFR, Estimated: 23 mL/min — ABNORMAL LOW (ref >60)
Glucose, Bld: 160 mg/dL — ABNORMAL HIGH (ref 70–99)
Potassium: 3.5 mmol/L (ref 3.5–5.1)
Sodium: 138 mmol/L (ref 135–145)

## 2023-02-15 LAB — BRAIN NATRIURETIC PEPTIDE: B Natriuretic Peptide: 866.5 pg/mL — ABNORMAL HIGH (ref 0.0–100.0)

## 2023-02-15 LAB — MAGNESIUM: Magnesium: 2.3 mg/dL (ref 1.7–2.4)

## 2023-02-15 MED ORDER — AMLODIPINE BESYLATE 10 MG PO TABS: 5.0000 mg | ORAL_TABLET | Freq: Every day | ORAL | 8 refills | Status: DC

## 2023-02-15 MED ORDER — ENTRESTO 49-51 MG PO TABS: 1.0000 | ORAL_TABLET | Freq: Two times a day (BID) | ORAL | 8 refills | Status: DC

## 2023-02-15 MED ORDER — TORSEMIDE 20 MG PO TABS: 60.0000 mg | ORAL_TABLET | Freq: Two times a day (BID) | ORAL | 8 refills | Status: DC

## 2023-02-15 NOTE — Progress Notes (Signed)
ReDS Vest / Clip - 02/15/23 1100       ReDS Vest / Clip   Station Marker D    Ruler Value 41    ReDS Value Range Moderate volume overload    ReDS Actual Value 37    Anatomical Comments sitting

## 2023-02-15 NOTE — Research (Unsigned)
   SITE #  001          SUBJECT #   [id number]- DAY FOLLOW UP VISIT  Discharge Date / time::  _23_ / _Feb_/ _2024_   at _16_:_14_      []   Time unknown      Date of Follow-up Visit: _29_ / _MAR_/ _2024_   at _11_:_00_      []   Time unknown    Labs drawn today   Were there any changes to subjects medications?  [x]  YES  []   NO     Updates:______  Did the subject experience any NEW adverse events?  []  YES  [x]   NO     Update:____  Was the subject hospitalized between 7 day and 30 day follow-up visit?  No   What is the number of home days between 7-day and 30-day follow-up visit? 30 days

## 2023-02-15 NOTE — Patient Instructions (Signed)
Thank you for coming in today  Labs were done today, if any labs are abnormal the clinic will call you No news is good news  Medications: RESTART Entresto 49/51 mg 1 tablet twice daily  INCREASE Torsemide to 80 mg 4 tablets daily for 3 days only, then go to 60 mg 3 tablets daily  Follow up appointments: Your physician recommends that you return for lab work in:  10-14 days for BMET  Your physician recommends that you schedule a follow-up appointment in:  3 weeks in clinic   4 months with Dr. Haroldine Laws You will receive a reminder letter in the mail a few months in advance. If you don't receive a letter, please call our office to schedule the follow-up appointment.      Do the following things EVERYDAY: Weigh yourself in the morning before breakfast. Write it down and keep it in a log. Take your medicines as prescribed Eat low salt foods--Limit salt (sodium) to 2000 mg per day.  Stay as active as you can everyday Limit all fluids for the day to less than 2 liters   At the [facility], you and your health needs are our priority. As part of our continuing mission to provide you with exceptional heart care, we have created designated Provider Care Teams. These Care Teams include your primary Cardiologist (physician) and Advanced Practice Providers (APPs- Physician Assistants and Nurse Practitioners) who all work together to provide you with the care you need, when you need it.   You may see any of the following providers on your designated Care Team at your next follow up: Dr Glori Bickers Dr Loralie Champagne Dr. Roxana Hires, NP Lyda Jester, [place] [facility] [place], [place] Forestine Na, NP Audry Riles, PharmD   Please be sure to bring in all your medications bottles to every appointment.    Thank you for choosing [facility]  If you have any questions or concerns before your next  appointment please send [place] a message through [place] or call our office at [phone].    TO LEAVE A MESSAGE FOR THE NURSE SELECT OPTION 2, PLEASE LEAVE A MESSAGE INCLUDING: YOUR NAME DATE OF BIRTH CALL BACK NUMBER REASON FOR CALL**this is important as we prioritize the call backs  YOU WILL RECEIVE A CALL BACK THE SAME DAY AS LONG AS YOU CALL BEFORE 4:00 PM

## 2023-02-18 DIAGNOSIS — H401131 Primary open-angle glaucoma, bilateral, mild stage: Secondary | ICD-10-CM | POA: Diagnosis not present

## 2023-02-19 NOTE — Research (Signed)
Discharge SITE #   001            SUBJECT #:  10            Date/Time of DISCHARGE:   _23_/_FEB_/_2024_ at _16_:_14_  Days in ICU/Telemetry: 4  Subject Discharged to:    []  Extended Care Facility []  Rehabilitation Facility [x]  Home []  Other:_______________  Discharge Diuretic: DRUG:    DOSE:                                                        FREQUENCY:    Were there any changes in subjects' concomitant medications?  [x]  yes  []  no  Did the subject experience any new adverse events?  []  yes  [x]  no (if yes, update adverse events form)  Was auditory testing performed?   [x]  yes   []  no (if no, provide reason)  Treatment Assessment: (24 hours after Treatment initiation or Therapy End IF < 24 hours)  Date/Time of Assessment ___/____/____  at ____:______  Estimated excess fluid volume:____________lbs./____________kg  Diuretic administration:  []  Continuous   [x]  Bolus []  Both Total diuretic infused:                           mg  Total urine:                           mL  Urine production rate:                      mL/hr  Total saline infused:                           mL  Pump infusion rate:                      mL/hr   FLUID BALANCE  Input DMS total infusion         mL []  N/A  Other IV fluid total         mL []  N/A  Oral fluid total         mL []  N/A  Total input         mL []  N/A   Output Total Urine         mL []  N/A  Missed Urine         mL []  N/A  Other Fluid Output         mL []  N/A  Total output         mL []  N/A   WEIGHT ASSESSMENT  Exam date/time: ______/______/______ at ______:______  []  Time unknown Was standing weight measured?  []  yes   []  no  weight: ____________lbs./____________kg  If no, rationale:  Was weight obtained using sponsor provided scale?  []  yes  []  no  If no, what scale was used for assessment?    Was URINE/BLOOD sample collected and processed for SITE LAB?        [x]  yes    []  no    []  not required per  protocol Collection date/time: ______/______/______ ______:______   []  Time unknown  Was URINE/BLOOD sample collected and processed for CORE lab?  [x]   yes  []  no  []  not required per protocol Collection date/time: ______/______/______ ______:______   []  Time unknown  Date/Time of Assessment Date/Time of Assessment ______/______/______    Please indicate if the following has occurred since subjects last assessment. (Only applicable [id number] from initial visit to discharge visit)  Acute kidney injury, defined as use of renal replacement therapy (RRT). RRT includes hemodialysis, hemofiltration, hemodiafiltration, peritoneal dialysis, and kidney transplant. Isolated ultrafiltration without dialysis will not be considered RRT. *Note- acute kidney injury defined as KDIGO stage 2 or greater is captured in the Serum Chemistry labs CRF  []  yes [x]  no  Symptomatic hypotension, defined as sustained hypotension (0000000 mmHg systolic) with corresponding symptoms that require intervention(i.e., fluid bolus or vasoactive medication) []  yes  [x]  no  Hypertensive emergency, defined as blood pressure greater than 180/120 mmHg with end-organ damage (cardiovascular, cerebrovascular, or renal)   []  yes   [x]  no   Auditory Testing Completed: _23_/_FEB_/_2024_ at_14_:_29_  []  Time unknown  DIURETIC LOG:   Diuretic Name Start date Start time End date End time Dose Frequency  Total                              Current dyspnea score:  How much difficulty are you having in breathing now? Mildly short of breath   Patient Assessed Dyspnea Status:  Compared to how much difficulty you were having with your breathing just before study drug was started, how is your breathing now? Moderately better

## 2023-02-20 NOTE — Research (Signed)
48 Hours Post Treatment Initiation SITE #   001            SUBJECT #:             SUBJECT INITIALS:   Treatment Assessment: (48 hours after Treatment initiation or Therapy End IF < 48 hours)  FLUID BALANCE:   Date/Time of Assessment _22__/_FEB___/_2024___   (DD/MMM/YYYY)  at _16__:_00___   Interval:  []  24 hours (during Tx)      [x]  48 hours (during Tx)      []  72 hours (End of Tx) OR      []  72 hours after Tx initiation       []  Days after Tx initiation       []  Discharge  Input DMS total infusion         mL []  N/A  Other IV fluid total   102    mL []  N/A  Oral fluid total   2160      mL []  N/A  Total input    2262     mL []  N/A   Output Total Urine 47829      mL []  N/A  Missed Urine  0       mL []  N/A  Other Fluid Output   0      mL []  N/A  Total output 10400      mL []  N/A     WEIGHT ASSESSMENT Interval:     []  Screening/Baseline                    []  Tx initiation                    []  24 hours-during Tx                    [x]  48 hours-during Tx                    []  72 hours- End of Tx                    []  Post Tx- within 12 hours                    []  Post Tx- 72 hours after Tx initiation                    []  Post Tx- 7 days after Tx initiation                    []  Discharge                    []  7-day follow up                    []  30-day follow up    Exam Date/Time: _22_/_FEB_/_2024_ at __15_:__24_  []  Time unknown Was standing weight measured?  [x]  yes  []   no   If yes, weight:_288.8_lbs.   If no, rationale: Was assessment completed using sponsor provided weight scale?  [x]  yes  []  no  If no, what scale was used for assessment?   Was BLOOD sample collected and processed for Site LAB?       [x]  yes    []  no    []  not required per protocol  Was URINE sample collected and processed for Site LAB?  [x]  yes  []  no  []   not required per protocol  Date/Time of Assessment Date/Time of Assessment _22_/_FEB_/_2024____     Please indicate if the  following has occurred since the subjects last assessment. (Only applicable 12h from initial visit to discharge visit)  Acute kidney injury, defined as use of renal replacement therapy (RRT). RRT includes hemodialysis, hemofiltration, hemodiafiltration, peritoneal dialysis, and kidney transplant. Isolated ultrafiltration without dialysis will not be considered RRT. *Note- acute kidney injury defined as KDIGO stage 2 or greater is captured in the Serum Chemistry labs CRF  []  yes [x]  no  Symptomatic hypotension, defined as sustained hypotension (<80 mmHg systolic) with corresponding symptoms that require intervention(i.e., fluid bolus or vasoactive medication) []  yes  [x]  no  Hypertensive emergency, defined as blood pressure greater than 180/120 mmHg with end-organ damage (cardiovascular, cerebrovascular or renal)   []  yes   [x]  no

## 2023-02-26 ENCOUNTER — Ambulatory Visit (HOSPITAL_COMMUNITY)
Admission: RE | Admit: 2023-02-26 | Discharge: 2023-02-26 | Disposition: A | Discharge status: Home / Self Care | Payer: Medicare Other | Source: Ambulatory Visit | Attending: Cardiology | Admitting: Cardiology

## 2023-02-26 DIAGNOSIS — I5032 Chronic diastolic (congestive) heart failure: Secondary | ICD-10-CM | POA: Diagnosis not present

## 2023-02-26 LAB — BASIC METABOLIC PANEL
Anion gap: 11 (ref 5–15)
BUN: 42 mg/dL — ABNORMAL HIGH (ref 8–23)
CO2: 28 mmol/L (ref 22–32)
Calcium: 9.1 mg/dL (ref 8.9–10.3)
Chloride: 98 mmol/L (ref 98–111)
Creatinine, Ser: 2.84 mg/dL — ABNORMAL HIGH (ref 0.61–1.24)
GFR, Estimated: 24 mL/min — ABNORMAL LOW (ref >60)
Glucose, Bld: 308 mg/dL — ABNORMAL HIGH (ref 70–99)
Potassium: 3.5 mmol/L (ref 3.5–5.1)
Sodium: 137 mmol/L (ref 135–145)

## 2023-02-27 ENCOUNTER — Other Ambulatory Visit (HOSPITAL_COMMUNITY): Payer: Medicare Other

## 2023-02-28 ENCOUNTER — Other Ambulatory Visit (HOSPITAL_COMMUNITY): Payer: Medicare Other

## 2023-03-04 ENCOUNTER — Other Ambulatory Visit (HOSPITAL_COMMUNITY): Payer: Self-pay | Admitting: Internal Medicine

## 2023-03-08 ENCOUNTER — Ambulatory Visit (HOSPITAL_COMMUNITY)
Admission: RE | Admit: 2023-03-08 | Discharge: 2023-03-08 | Disposition: A | Discharge status: Home / Self Care | Payer: Medicare Other | Source: Ambulatory Visit | Attending: Family Medicine | Admitting: Family Medicine

## 2023-03-08 ENCOUNTER — Encounter (HOSPITAL_COMMUNITY): Payer: Self-pay

## 2023-03-08 VITALS — BP 128/60 | HR 50 | Wt 306.0 lb

## 2023-03-08 DIAGNOSIS — Z79899 Other long term (current) drug therapy: Secondary | ICD-10-CM | POA: Diagnosis not present

## 2023-03-08 DIAGNOSIS — E1122 Type 2 diabetes mellitus with diabetic chronic kidney disease: Secondary | ICD-10-CM | POA: Diagnosis not present

## 2023-03-08 DIAGNOSIS — Z6839 Body mass index (BMI) 39.0-39.9, adult: Secondary | ICD-10-CM | POA: Insufficient documentation

## 2023-03-08 DIAGNOSIS — N184 Chronic kidney disease, stage 4 (severe): Secondary | ICD-10-CM | POA: Diagnosis not present

## 2023-03-08 DIAGNOSIS — I130 Hypertensive heart and chronic kidney disease with heart failure and stage 1 through stage 4 chronic kidney disease, or unspecified chronic kidney disease: Secondary | ICD-10-CM | POA: Insufficient documentation

## 2023-03-08 DIAGNOSIS — Z7984 Long term (current) use of oral hypoglycemic drugs: Secondary | ICD-10-CM | POA: Insufficient documentation

## 2023-03-08 DIAGNOSIS — E669 Obesity, unspecified: Secondary | ICD-10-CM | POA: Insufficient documentation

## 2023-03-08 DIAGNOSIS — I5032 Chronic diastolic (congestive) heart failure: Secondary | ICD-10-CM | POA: Diagnosis not present

## 2023-03-08 DIAGNOSIS — I10 Essential (primary) hypertension: Secondary | ICD-10-CM | POA: Diagnosis not present

## 2023-03-08 DIAGNOSIS — E118 Type 2 diabetes mellitus with unspecified complications: Secondary | ICD-10-CM | POA: Diagnosis not present

## 2023-03-08 DIAGNOSIS — R52 Pain, unspecified: Secondary | ICD-10-CM | POA: Diagnosis not present

## 2023-03-08 DIAGNOSIS — Z79890 Hormone replacement therapy (postmenopausal): Secondary | ICD-10-CM | POA: Diagnosis not present

## 2023-03-08 DIAGNOSIS — E039 Hypothyroidism, unspecified: Secondary | ICD-10-CM | POA: Insufficient documentation

## 2023-03-08 DIAGNOSIS — G4733 Obstructive sleep apnea (adult) (pediatric): Secondary | ICD-10-CM | POA: Diagnosis not present

## 2023-03-08 DIAGNOSIS — L0291 Cutaneous abscess, unspecified: Secondary | ICD-10-CM | POA: Diagnosis not present

## 2023-03-08 DIAGNOSIS — Z794 Long term (current) use of insulin: Secondary | ICD-10-CM | POA: Diagnosis not present

## 2023-03-08 DIAGNOSIS — R229 Localized swelling, mass and lump, unspecified: Secondary | ICD-10-CM | POA: Diagnosis not present

## 2023-03-08 LAB — BASIC METABOLIC PANEL
Anion gap: 10 (ref 5–15)
BUN: 44 mg/dL — ABNORMAL HIGH (ref 8–23)
CO2: 27 mmol/L (ref 22–32)
Calcium: 9 mg/dL (ref 8.9–10.3)
Chloride: 104 mmol/L (ref 98–111)
Creatinine, Ser: 3.08 mg/dL — ABNORMAL HIGH (ref 0.61–1.24)
GFR, Estimated: 21 mL/min — ABNORMAL LOW (ref >60)
Glucose, Bld: 129 mg/dL — ABNORMAL HIGH (ref 70–99)
Potassium: 3.3 mmol/L — ABNORMAL LOW (ref 3.5–5.1)
Sodium: 141 mmol/L (ref 135–145)

## 2023-03-08 LAB — BRAIN NATRIURETIC PEPTIDE: B Natriuretic Peptide: 945.3 pg/mL — ABNORMAL HIGH (ref 0.0–100.0)

## 2023-03-08 NOTE — Progress Notes (Signed)
ReDS Vest / Clip - 03/08/23 1234       ReDS Vest / Clip   Station Marker D    Ruler Value 36    ReDS Value Range Moderate volume overload    ReDS Actual Value 40    Anatomical Comments sitting

## 2023-03-08 NOTE — Addendum Note (Signed)
Encounter addended by: Jacklynn Ganong, FNP on: 03/08/2023 2:13 PM  Actions taken: Delete clinical note

## 2023-03-08 NOTE — Addendum Note (Signed)
Encounter addended by: Demetrius Charity, RN on: 03/08/2023 1:50 PM  Actions taken: Clinical Note Signed

## 2023-03-08 NOTE — Progress Notes (Signed)
ADVANCED HF CLINIC NOTE  PCP: Dr. Nehemiah Settle HF Cardiologist: Dr. Gala Romney  HPI: Brendan [name] is a 67 y.o. male with history of HFpEF, HTN, OSA on CPAP, DM2, hypothyroidism, CKD 3b (creatinine ~2.4) and obesity.   Admitted to [facility] 03/25/20 with A/C diastolic heart failure. Diuresed with IV lasix and transitioned to torsemide 40 bid. Discharge weight 301 pounds.   Admitted 01/07/2023 with A/C HFpEF. High sodium diet played a role in volume overload. Diuresed with IV lasix. Overall diuresed 23 pounds. Transitioned to torsemide 80 mg daily. GDMT adjusted. Enrolled in FASTR trial usual care arm. Discharge weight 294 pounds.   PCP follow up,was told to take hydralazine to 100 mg daily and cut back torsemide to 20 mg daily (due to elevated creatinine). 01/11/23 SCr 3.3, BUN 58, K 3.9.   Follow up 2/24, chronic NYHA II and volume stable on torsemide 20 mg daily. Weight 300 lbs.   Follow up 3/24, with NYHA II-early III, weight up 16 lbs and volume up. Entresto restarted and torsemide increased to 80 daily x 3 days, then 60 daily thereafter.  Today he returns for HF follow up. Overall feeling weak. He is not SOB walking on flat ground. Wearing CPAP. Had dizziness yesterday, had to lay down. Mild ankle swelling. Denies palpitations, CP, or PND/Orthopnea. Appetite ok. No fever or chills. Weight at home 301 pounds. Taking all medications. Home BP 120/60. Previously worked as Naval architect.  Cardiac Studies - Echo (2024): EF 55-60% RV ok. Grade II DD.  - Echo (2023): EF preserved Grade 1 DD.  - Echo (5/21): EF 55-60% Grade II DD. Moderate LVH , RV normal   Past Medical History:  Diagnosis Date   Acanthosis nigricans    Atopic dermatitis    Diabetes 2004   Erectile dysfunction    Fatty liver 2007   Glaucoma 2008   Gynecomastia 2009   Hyperaldosteronism 2000   Hypercholesterolemia    Hyperlipidemia    Hypertension    Hypoglycemic reaction    Hypothyroidism 2004   Incontinence    Intermittent vertigo  2012   Left cervical radiculopathy 2012   Lumbar radiculopathy    Obesity    Peripheral neuropathy    Pollen allergies 2007   perennial   Prostate cancer 2006   Reflux esophagitis 1995   Sickle cell trait 2006   Sleep apnea, obstructive 2000   uses a cpap   Venous insufficiency 2005   Vitamin D deficiency 2012   Current Outpatient Medications  Medication Sig Dispense Refill   acetaminophen (TYLENOL) 500 MG tablet Take 500 mg by mouth every 6 (six) hours as needed for mild pain.     amLODipine (NORVASC) 10 MG tablet Take 0.5 tablets (5 mg total) by mouth daily. 15 tablet 8   Blood Glucose Monitoring Suppl (ONE TOUCH ULTRA SYSTEM KIT) W/DEVICE KIT 1 kit by Does not apply route once. One touch ultrasoft lancets     brimonidine-timolol (COMBIGAN) 0.2-0.5 % ophthalmic solution Place 1 drop into both eyes every 12 (twelve) hours.     carvedilol (COREG) 25 MG tablet Take 1 tablet (25 mg total) by mouth 2 (two) times daily. 180 tablet 1   dapagliflozin propanediol (FARXIGA) 10 MG TABS tablet Take 1 tablet (10 mg total) by mouth daily. 30 tablet 0   HUMALOG KWIKPEN 200 UNIT/ML KwikPen Inject 10-22 Units into the skin 3 (three) times daily. PLUS 4 extra units if blood sugar over 200 mg/dL     hydrALAZINE (APRESOLINE) 100 MG  tablet Take 0.5 tablets (50 mg total) by mouth 2 (two) times daily. 90 tablet 3   Insulin Glargine (BASAGLAR KWIKPEN) 100 UNIT/ML Inject 38 Units into the skin daily.     KERENDIA 20 MG TABS Take 1 tablet by mouth every morning.     levothyroxine (SYNTHROID) 137 MCG tablet Take 137 mcg by mouth every morning.     sacubitril-valsartan (ENTRESTO) 49-51 MG Take 1 tablet by mouth 2 (two) times daily. 60 tablet 8   simvastatin (ZOCOR) 20 MG tablet Take 20 mg by mouth every evening.     torsemide (DEMADEX) 20 MG tablet Take 3 tablets (60 mg total) by mouth 2 (two) times daily. (Patient taking differently: Patient takes 3 tablets by mouth in the morning.) 210 tablet 8   No current  facility-administered medications for this encounter.   Allergies  Allergen Reactions   Ace Inhibitors Cough   Maxidex [Dexamethasone] Other (See Comments)    Sexual dysfunction   Verapamil Other (See Comments)    constipation   Social History   Socioeconomic History   Marital status: Married    Spouse name: Not on file   Number of children: 3   Years of education: Not on file   Highest education level: Not on file  Occupational History   Occupation: truck driver    Comment: Retired Futures trader  Tobacco Use   Smoking status: Never   Smokeless tobacco: Never  Vaping Use   Vaping Use: Never used  Substance and Sexual Activity   Alcohol use: Yes    Comment: one drink every few months   Drug use: No   Sexual activity: Not on file  Other Topics Concern   Not on file  Social History Narrative   Right handed   Lives in a two story home    Drinks no caffeine    Social Determinants of Health   Financial Resource Strain: Low Risk  (01/08/2023)   Overall Financial Resource Strain (CARDIA)    Difficulty of Paying Living Expenses: Not hard at all  Food Insecurity: No Food Insecurity (01/08/2023)   Hunger Vital Sign    Worried About Running Out of Food in the Last Year: Never true    Ran Out of Food in the Last Year: Never true  Transportation Needs: No Transportation Needs (01/08/2023)   PRAPARE - Administrator, Civil Service (Medical): No    Lack of Transportation (Non-Medical): No  Physical Activity: Not on file  Stress: Not on file  Social Connections: Not on file  Intimate Partner Violence: Not on file   Family History  Problem Relation Age of Onset   Hypertension Father        deceased age 17   Heart attack Father    Heart failure Mother    COPD Sister    CVA Sister    Diabetes Daughter    Colon cancer Neg Hx    Stomach cancer Neg Hx    BP 128/60   Pulse (!) 50   Wt (!) 138.8 kg (306 lb)   SpO2 99%   BMI 39.29 kg/m   Wt Readings from  Last 3 Encounters:  03/08/23 (!) 138.8 kg (306 lb)  02/15/23 (!) 143.5 kg (316 lb 6.4 oz)  02/15/23 (!) 142.6 kg (314 lb 4.8 oz)   PHYSICAL EXAM: General:  NAD. No resp difficulty, walked into clinic HEENT: Normal Neck: Supple. No JVD, thick neck. Carotids 2+ bilat; no bruits. No lymphadenopathy or thryomegaly appreciated.  Cor: PMI nondisplaced. Brady rate & rhythm. No rubs, gallops or murmurs. Lungs: Clear Abdomen: Soft, obese, nontender, nondistended. No hepatosplenomegaly. No bruits or masses. Good bowel sounds. Extremities: No cyanosis, clubbing, rash, 1+ BLE pre-tibial edema Neuro: Alert & oriented x 3, cranial nerves grossly intact. Moves all 4 extremities w/o difficulty. Affect pleasant.  ReDs: 40%   ASSESSMENT & PLAN: 1. Chronic Diastolic Heart Failure, - Echo 03/2020 EF preserved EF LVH Grade II DD - Echo 2024 EF 55-60% RV ok. Grade II DD.  - NYHA II-early III. Volume better today and weight down 10 lbs, ReDs 40% but suspect body habitus contributing to elevated readings. GDMT limited by CKD & what sounds like orthostasis - Place compression hose, given Rx - Move Kerendia 20 mg to qhs to see if this helps orthostasis symptoms. - Continue Entresto 49/51 mg bid. - Continue torsemide 60 mg daily. - Continue Coreg 25 mg bid. - Continue Farxiga 10 mg daily.  - Continue hydralazine 50 mg bid. - Labs today.   2. HTN  - BP well-controlled. - with dizziness, stop amlodipine. - Continue to check BP at home. - Follows with Dr. Duke Salvia in HTN program  3. DMII - Hgb A1C 8.6  - On SGLT2i - Blood glucose fluctuating from 50s-300s - Followed by Endocrinology   4. OSA - Continue CPAP nightly.   5. Obesity  - Body mass index is 39.29 kg/m.  - Consider referral to PharmD for GLP1RA  6. CKD Stage IV - Baseline SCr 2.4-2.6 - Followed by Dr Marisue Humble.  - Labs today.  Follow up in 3 months with Dr. Demaris Callander, FNP-BC 03/08/23

## 2023-03-08 NOTE — Progress Notes (Deleted)
ReDS Vest / Clip - 03/08/23 1200       ReDS Vest / Clip   Station Marker C    Ruler Value 31    ReDS Value Range High volume overload    ReDS Actual Value 46

## 2023-03-08 NOTE — Patient Instructions (Addendum)
Thank you for coming in today  Labs were done today, if any labs are abnormal the clinic will call you No news is good news  Medications: Stop amlodipine Move Kerendia to nightly   You have been given a prescription for compression hose. And a list of places who supply them. Please wear your compression hose daily, place them on as soon as you get up in the morning and remove before you go to bed at night.  Follow up appointments:  Your physician recommends that you schedule a follow-up appointment in:  3 months with Dr. Gala Romney You will receive a reminder letter in the mail a few months in advance. If you don't receive a letter, please call our office to schedule the follow-up appointment.     Do the following things EVERYDAY: Weigh yourself in the morning before breakfast. Write it down and keep it in a log. Take your medicines as prescribed Eat low salt foods--Limit salt (sodium) to 2000 mg per day.  Stay as active as you can everyday Limit all fluids for the day to less than 2 liters   At the [facility], you and your health needs are our priority. As part of our continuing mission to provide you with exceptional heart care, we have created designated Provider Care Teams. These Care Teams include your primary Cardiologist (physician) and Advanced Practice Providers (APPs- Physician Assistants and Nurse Practitioners) who all work together to provide you with the care you need, when you need it.   You may see any of the following providers on your designated Care Team at your next follow up: Dr Arvilla Meres Dr Marca Ancona Dr. Marcos Eke, NP Robbie Lis, [place] [facility] [place], [place] Brynda Peon, NP Karle Plumber, PharmD   Please be sure to bring in all your medications bottles to every appointment.    Thank you for choosing [facility]  If you have any questions or concerns  before your next appointment please send [place] a message through [place] or call our office at [phone].    TO LEAVE A MESSAGE FOR THE NURSE SELECT OPTION 2, PLEASE LEAVE A MESSAGE INCLUDING: YOUR NAME DATE OF BIRTH CALL BACK NUMBER REASON FOR CALL**this is important as we prioritize the call backs  YOU WILL RECEIVE A CALL BACK THE SAME DAY AS LONG AS YOU CALL BEFORE 4:00 PM

## 2023-03-11 ENCOUNTER — Telehealth (HOSPITAL_COMMUNITY): Payer: Self-pay | Admitting: *Deleted

## 2023-03-11 DIAGNOSIS — I5032 Chronic diastolic (congestive) heart failure: Secondary | ICD-10-CM

## 2023-03-11 DIAGNOSIS — I10 Essential (primary) hypertension: Secondary | ICD-10-CM

## 2023-03-11 MED ORDER — POTASSIUM CHLORIDE CRYS ER 20 MEQ PO TBCR
20.0000 meq | EXTENDED_RELEASE_TABLET | Freq: Every day | ORAL | 3 refills | Status: DC
Start: 2023-03-11 — End: 2023-03-25

## 2023-03-11 NOTE — Telephone Encounter (Signed)
Called patient per [facility] with following lab result and instructions:  "K is low.  Please start 20 KCL daily and repeat BMET in 10 days"  Pt verbalized understanding of same. States he has potassium at home, updated Rx sent. Repeat lab ordered and scheduled. Code to parking garage given to patient for May.

## 2023-03-12 DIAGNOSIS — N184 Chronic kidney disease, stage 4 (severe): Secondary | ICD-10-CM | POA: Diagnosis not present

## 2023-03-12 DIAGNOSIS — I129 Hypertensive chronic kidney disease with stage 1 through stage 4 chronic kidney disease, or unspecified chronic kidney disease: Secondary | ICD-10-CM | POA: Diagnosis not present

## 2023-03-12 DIAGNOSIS — R809 Proteinuria, unspecified: Secondary | ICD-10-CM | POA: Diagnosis not present

## 2023-03-12 DIAGNOSIS — E1122 Type 2 diabetes mellitus with diabetic chronic kidney disease: Secondary | ICD-10-CM | POA: Diagnosis not present

## 2023-03-21 ENCOUNTER — Encounter (HOSPITAL_COMMUNITY): Payer: Self-pay | Admitting: Internal Medicine

## 2023-03-21 DIAGNOSIS — N1832 Chronic kidney disease, stage 3b: Secondary | ICD-10-CM | POA: Diagnosis not present

## 2023-03-21 DIAGNOSIS — E039 Hypothyroidism, unspecified: Secondary | ICD-10-CM | POA: Diagnosis not present

## 2023-03-21 DIAGNOSIS — Z794 Long term (current) use of insulin: Secondary | ICD-10-CM | POA: Diagnosis not present

## 2023-03-21 DIAGNOSIS — E1122 Type 2 diabetes mellitus with diabetic chronic kidney disease: Secondary | ICD-10-CM | POA: Diagnosis not present

## 2023-03-22 ENCOUNTER — Ambulatory Visit (HOSPITAL_COMMUNITY)
Admission: RE | Admit: 2023-03-22 | Discharge: 2023-03-22 | Disposition: A | Discharge status: Home / Self Care | Payer: Medicare Other | Source: Ambulatory Visit | Attending: Cardiology | Admitting: Cardiology

## 2023-03-22 DIAGNOSIS — I5032 Chronic diastolic (congestive) heart failure: Secondary | ICD-10-CM | POA: Diagnosis not present

## 2023-03-22 DIAGNOSIS — I110 Hypertensive heart disease with heart failure: Secondary | ICD-10-CM | POA: Insufficient documentation

## 2023-03-22 DIAGNOSIS — I10 Essential (primary) hypertension: Secondary | ICD-10-CM

## 2023-03-22 LAB — BASIC METABOLIC PANEL
Anion gap: 10 (ref 5–15)
BUN: 40 mg/dL — ABNORMAL HIGH (ref 8–23)
CO2: 25 mmol/L (ref 22–32)
Calcium: 8.5 mg/dL — ABNORMAL LOW (ref 8.9–10.3)
Chloride: 101 mmol/L (ref 98–111)
Creatinine, Ser: 2.75 mg/dL — ABNORMAL HIGH (ref 0.61–1.24)
GFR, Estimated: 25 mL/min — ABNORMAL LOW (ref >60)
Glucose, Bld: 351 mg/dL — ABNORMAL HIGH (ref 70–99)
Potassium: 3.1 mmol/L — ABNORMAL LOW (ref 3.5–5.1)
Sodium: 136 mmol/L (ref 135–145)

## 2023-03-25 ENCOUNTER — Telehealth (HOSPITAL_COMMUNITY): Payer: Self-pay

## 2023-03-25 DIAGNOSIS — I10 Essential (primary) hypertension: Secondary | ICD-10-CM

## 2023-03-25 DIAGNOSIS — I5032 Chronic diastolic (congestive) heart failure: Secondary | ICD-10-CM

## 2023-03-25 MED ORDER — POTASSIUM CHLORIDE CRYS ER 20 MEQ PO TBCR: 40.0000 meq | EXTENDED_RELEASE_TABLET | Freq: Every day | ORAL | 3 refills | Status: DC

## 2023-03-25 NOTE — Telephone Encounter (Addendum)
Pt aware, agreeable, and verbalized understanding  Labs ordered and scheduled.  Rx updated   ----- Message from Jacklynn Ganong, FNP sent at 03/22/2023 12:27 PM EDT ----- K is lower. Did he start 20 KCL daily? If so, take 40 bid x 1 day, then increase to 40 daily thereafter. Repeat BMET in 1 week.

## 2023-03-26 ENCOUNTER — Other Ambulatory Visit (HOSPITAL_COMMUNITY): Payer: Self-pay | Admitting: Internal Medicine

## 2023-03-29 ENCOUNTER — Ambulatory Visit (HOSPITAL_COMMUNITY)
Admission: RE | Admit: 2023-03-29 | Discharge: 2023-03-29 | Disposition: A | Discharge status: Home / Self Care | Payer: Medicare Other | Source: Ambulatory Visit | Attending: Cardiology | Admitting: Cardiology

## 2023-03-29 DIAGNOSIS — I5032 Chronic diastolic (congestive) heart failure: Secondary | ICD-10-CM | POA: Insufficient documentation

## 2023-03-29 LAB — BASIC METABOLIC PANEL
Anion gap: 8 (ref 5–15)
BUN: 40 mg/dL — ABNORMAL HIGH (ref 8–23)
CO2: 25 mmol/L (ref 22–32)
Calcium: 8.9 mg/dL (ref 8.9–10.3)
Chloride: 105 mmol/L (ref 98–111)
Creatinine, Ser: 2.82 mg/dL — ABNORMAL HIGH (ref 0.61–1.24)
GFR, Estimated: 24 mL/min — ABNORMAL LOW (ref >60)
Glucose, Bld: 115 mg/dL — ABNORMAL HIGH (ref 70–99)
Potassium: 3.6 mmol/L (ref 3.5–5.1)
Sodium: 138 mmol/L (ref 135–145)

## 2023-04-03 DIAGNOSIS — L723 Sebaceous cyst: Secondary | ICD-10-CM | POA: Diagnosis not present

## 2023-04-08 NOTE — Progress Notes (Signed)
Confirmed with Tonya at Dr. Danise Edge office that patient is STRAIGHT local. No Anesthesia for this procedure, patient will not receive an IV and will be able to eat and drink DOS

## 2023-04-09 ENCOUNTER — Other Ambulatory Visit: Payer: Self-pay

## 2023-04-09 NOTE — Progress Notes (Incomplete)
ADVANCED HF CLINIC NOTE  PCP: Dr. Nehemiah Settle HF Cardiologist: Dr. Gala Romney  HPI: [name] is a 67 y.o. male with history of HFpEF, HTN, OSA on CPAP, DM2, hypothyroidism, CKD 3b (creatinine ~2.4) and obesity.   Admitted to [facility] 03/25/20 with A/C diastolic heart failure. Diuresed with IV lasix and transitioned to torsemide 40 bid. Discharge weight 301 pounds.   Admitted 01/07/2023 with A/C HFpEF. High sodium diet played a role in volume overload. Diuresed with IV lasix. Overall diuresed 23 pounds. Transitioned to torsemide 80 mg daily. GDMT adjusted. Enrolled in FASTR trial usual care arm. Discharge weight 294 pounds.   PCP follow up,was told to take hydralazine to 100 mg daily and cut back torsemide to 20 mg daily (due to elevated creatinine). 01/11/23 SCr 3.3, BUN 58, K 3.9.   Follow up 2/24, chronic NYHA II and volume stable on torsemide 20 mg daily. Weight 300 lbs.   Follow up 3/24, with NYHA II-early III, weight up 16 lbs and volume up. Entresto restarted and torsemide increased to 80 daily x 3 days, then 60 daily thereafter.  Today he returns for HF follow up. Overall feeling weak. He is not SOB walking on flat ground. Wearing CPAP. Had dizziness yesterday, had to lay down. Mild ankle swelling. Denies palpitations, CP, or PND/Orthopnea. Appetite ok. No fever or chills. Weight at home 301 pounds. Taking all medications. Home BP 120/60. Previously worked as Naval architect.  Cardiac Studies - Echo (2024): EF 55-60% RV ok. Grade II DD.  - Echo (2023): EF preserved Grade 1 DD.  - Echo (5/21): EF 55-60% Grade II DD. Moderate LVH , RV normal   Past Medical History:  Diagnosis Date   Acanthosis nigricans    Atopic dermatitis    Diabetes (HCC) 2004   Erectile dysfunction    Fatty liver 2007   Glaucoma 2008   Gynecomastia 2009   Hyperaldosteronism (HCC) 2000   Hypercholesterolemia    Hyperlipidemia    Hypertension    Hypoglycemic reaction    Hypothyroidism 2004   Incontinence     Intermittent vertigo 2012   Left cervical radiculopathy 2012   Lumbar radiculopathy    Obesity    Peripheral neuropathy    Pollen allergies 2007   perennial   Prostate cancer (HCC) 2006   Reflux esophagitis 1995   Sickle cell trait (HCC) 2006   Sleep apnea, obstructive 2000   uses a cpap   Venous insufficiency 2005   Vitamin D deficiency 2012   Current Outpatient Medications  Medication Sig Dispense Refill   acetaminophen (TYLENOL) 500 MG tablet Take 500 mg by mouth every 6 (six) hours as needed for mild pain.     Blood Glucose Monitoring Suppl (ONE TOUCH ULTRA SYSTEM KIT) W/DEVICE KIT 1 kit by Does not apply route once. One touch ultrasoft lancets     brimonidine-timolol (COMBIGAN) 0.2-0.5 % ophthalmic solution Place 1 drop into both eyes every 12 (twelve) hours.     carvedilol (COREG) 25 MG tablet Take 1 tablet (25 mg total) by mouth 2 (two) times daily. 180 tablet 1   dapagliflozin propanediol (FARXIGA) 10 MG TABS tablet Take 1 tablet (10 mg total) by mouth daily. 30 tablet 0   HUMALOG KWIKPEN 200 UNIT/ML KwikPen Inject 10-22 Units into the skin 3 (three) times daily. PLUS 4 extra units if blood sugar over 200 mg/dL     hydrALAZINE (APRESOLINE) 100 MG tablet Take 0.5 tablets (50 mg total) by mouth 2 (two) times daily. 90 tablet  3   Insulin Glargine (BASAGLAR KWIKPEN) 100 UNIT/ML Inject 38 Units into the skin daily.     KERENDIA 20 MG TABS Take 1 tablet by mouth at bedtime.     levothyroxine (SYNTHROID) 137 MCG tablet Take 137 mcg by mouth every morning.     potassium chloride SA (KLOR-CON M) 20 MEQ tablet Take 2 tablets (40 mEq total) by mouth daily. 90 tablet 3   sacubitril-valsartan (ENTRESTO) 49-51 MG Take 1 tablet by mouth 2 (two) times daily. 60 tablet 8   simvastatin (ZOCOR) 20 MG tablet Take 20 mg by mouth every evening.     torsemide (DEMADEX) 20 MG tablet Take 3 tablets (60 mg total) by mouth 2 (two) times daily. (Patient taking differently: Patient takes 3 tablets by mouth  in the morning.) 210 tablet 8   No current facility-administered medications for this visit.   Allergies  Allergen Reactions   Ace Inhibitors Cough   Maxidex [Dexamethasone] Other (See Comments)    Sexual dysfunction   Verapamil Other (See Comments)    constipation   Social History   Socioeconomic History   Marital status: Married    Spouse name: Not on file   Number of children: 3   Years of education: Not on file   Highest education level: Not on file  Occupational History   Occupation: truck driver    Comment: Retired Futures trader  Tobacco Use   Smoking status: Never   Smokeless tobacco: Never  Vaping Use   Vaping Use: Never used  Substance and Sexual Activity   Alcohol use: Yes    Comment: one drink every few months - 1965   Drug use: No   Sexual activity: Not on file  Other Topics Concern   Not on file  Social History Narrative   Right handed   Lives in a two story home    Drinks no caffeine    Social Determinants of Health   Financial Resource Strain: Low Risk  (01/08/2023)   Overall Financial Resource Strain (CARDIA)    Difficulty of Paying Living Expenses: Not hard at all  Food Insecurity: No Food Insecurity (01/08/2023)   Hunger Vital Sign    Worried About Running Out of Food in the Last Year: Never true    Ran Out of Food in the Last Year: Never true  Transportation Needs: No Transportation Needs (01/08/2023)   PRAPARE - Administrator, Civil Service (Medical): No    Lack of Transportation (Non-Medical): No  Physical Activity: Not on file  Stress: Not on file  Social Connections: Not on file  Intimate Partner Violence: Not on file   Family History  Problem Relation Age of Onset   Hypertension Father        deceased age 80   Heart attack Father    Heart failure Mother    COPD Sister    CVA Sister    Diabetes Daughter    Colon cancer Neg Hx    Stomach cancer Neg Hx    There were no vitals taken for this visit.  Wt Readings  from Last 3 Encounters:  04/09/23 133.4 kg (294 lb)  03/08/23 (!) 138.8 kg (306 lb)  02/15/23 (!) 143.5 kg (316 lb 6.4 oz)   PHYSICAL EXAM: General:  NAD. No resp difficulty, walked into clinic HEENT: Normal Neck: Supple. No JVD, thick neck. Carotids 2+ bilat; no bruits. No lymphadenopathy or thryomegaly appreciated. Cor: PMI nondisplaced. Brady rate & rhythm. No rubs, gallops or  murmurs. Lungs: Clear Abdomen: Soft, obese, nontender, nondistended. No hepatosplenomegaly. No bruits or masses. Good bowel sounds. Extremities: No cyanosis, clubbing, rash, 1+ BLE pre-tibial edema Neuro: Alert & oriented x 3, cranial nerves grossly intact. Moves all 4 extremities w/o difficulty. Affect pleasant.  ReDs: 40%   ASSESSMENT & PLAN: 1. Chronic Diastolic Heart Failure, - Echo 03/2020 EF preserved EF LVH Grade II DD - Echo 2024 EF 55-60% RV ok. Grade II DD.  - NYHA II-early III. Volume better today and weight down 10 lbs, ReDs 40% but suspect body habitus contributing to elevated readings. GDMT limited by CKD & what sounds like orthostasis - Place compression hose, given Rx - Move Kerendia 20 mg to qhs to see if this helps orthostasis symptoms. - Continue Entresto 49/51 mg bid. - Continue torsemide 60 mg daily. - Continue Coreg 25 mg bid. - Continue Farxiga 10 mg daily.  - Continue hydralazine 50 mg bid. - Labs today.   2. HTN  - BP well-controlled. - with dizziness, stop amlodipine. - Continue to check BP at home. - Follows with Dr. Duke Salvia in HTN program  3. DMII - Hgb A1C 8.6  - On SGLT2i - Blood glucose fluctuating from 50s-300s - Followed by Endocrinology   4. OSA - Continue CPAP nightly.   5. Obesity  - There is no height or weight on file to calculate BMI.  - Consider referral to PharmD for GLP1RA  6. CKD Stage IV - Baseline SCr 2.4-2.6 - Followed by Dr Marisue Humble.  - Labs today.  Follow up in 3 months with Dr. Demaris Callander, FNP-BC 04/09/23

## 2023-04-09 NOTE — Progress Notes (Signed)
Pt states cyst is on LEFT buttocks, not on the right. Dr. Simon Rhein note states right side, but ED visit from January states pt has left gluteal abscess. Called Orpah Greek, at [facility] to notify of this. She states she will look into this and correct as needed.

## 2023-04-10 ENCOUNTER — Encounter (HOSPITAL_COMMUNITY): Payer: Self-pay

## 2023-04-10 ENCOUNTER — Ambulatory Visit (HOSPITAL_COMMUNITY)
Admission: RE | Admit: 2023-04-10 | Discharge: 2023-04-10 | Disposition: A | Discharge status: Home / Self Care | Payer: Medicare Other | Source: Ambulatory Visit | Attending: Family Medicine | Admitting: Family Medicine

## 2023-04-10 ENCOUNTER — Encounter: Payer: Medicare Other | Admitting: *Deleted

## 2023-04-10 VITALS — BP 136/62 | HR 58 | Wt 310.0 lb

## 2023-04-10 DIAGNOSIS — E1122 Type 2 diabetes mellitus with diabetic chronic kidney disease: Secondary | ICD-10-CM | POA: Insufficient documentation

## 2023-04-10 DIAGNOSIS — E669 Obesity, unspecified: Secondary | ICD-10-CM | POA: Diagnosis not present

## 2023-04-10 DIAGNOSIS — Z7984 Long term (current) use of oral hypoglycemic drugs: Secondary | ICD-10-CM | POA: Insufficient documentation

## 2023-04-10 DIAGNOSIS — Z006 Encounter for examination for normal comparison and control in clinical research program: Secondary | ICD-10-CM

## 2023-04-10 DIAGNOSIS — Z6839 Body mass index (BMI) 39.0-39.9, adult: Secondary | ICD-10-CM | POA: Diagnosis not present

## 2023-04-10 DIAGNOSIS — G4733 Obstructive sleep apnea (adult) (pediatric): Secondary | ICD-10-CM | POA: Diagnosis not present

## 2023-04-10 DIAGNOSIS — N184 Chronic kidney disease, stage 4 (severe): Secondary | ICD-10-CM | POA: Insufficient documentation

## 2023-04-10 DIAGNOSIS — I130 Hypertensive heart and chronic kidney disease with heart failure and stage 1 through stage 4 chronic kidney disease, or unspecified chronic kidney disease: Secondary | ICD-10-CM | POA: Diagnosis not present

## 2023-04-10 DIAGNOSIS — Z794 Long term (current) use of insulin: Secondary | ICD-10-CM | POA: Diagnosis not present

## 2023-04-10 DIAGNOSIS — E1142 Type 2 diabetes mellitus with diabetic polyneuropathy: Secondary | ICD-10-CM | POA: Insufficient documentation

## 2023-04-10 DIAGNOSIS — I5032 Chronic diastolic (congestive) heart failure: Secondary | ICD-10-CM | POA: Insufficient documentation

## 2023-04-10 DIAGNOSIS — E118 Type 2 diabetes mellitus with unspecified complications: Secondary | ICD-10-CM

## 2023-04-10 DIAGNOSIS — E039 Hypothyroidism, unspecified: Secondary | ICD-10-CM | POA: Insufficient documentation

## 2023-04-10 DIAGNOSIS — I10 Essential (primary) hypertension: Secondary | ICD-10-CM | POA: Diagnosis not present

## 2023-04-10 DIAGNOSIS — Z79899 Other long term (current) drug therapy: Secondary | ICD-10-CM | POA: Insufficient documentation

## 2023-04-10 LAB — BASIC METABOLIC PANEL
Anion gap: 10 (ref 5–15)
BUN: 37 mg/dL — ABNORMAL HIGH (ref 8–23)
CO2: 24 mmol/L (ref 22–32)
Calcium: 8.5 mg/dL — ABNORMAL LOW (ref 8.9–10.3)
Chloride: 99 mmol/L (ref 98–111)
Creatinine, Ser: 2.83 mg/dL — ABNORMAL HIGH (ref 0.61–1.24)
GFR, Estimated: 24 mL/min — ABNORMAL LOW (ref >60)
Glucose, Bld: 175 mg/dL — ABNORMAL HIGH (ref 70–99)
Potassium: 3.2 mmol/L — ABNORMAL LOW (ref 3.5–5.1)
Sodium: 133 mmol/L — ABNORMAL LOW (ref 135–145)

## 2023-04-10 LAB — BRAIN NATRIURETIC PEPTIDE: B Natriuretic Peptide: 871.2 pg/mL — ABNORMAL HIGH (ref 0.0–100.0)

## 2023-04-10 MED ORDER — POTASSIUM CHLORIDE 20 MEQ PO PACK
40.0000 meq | PACK | Freq: Every day | ORAL | 5 refills | Status: DC
  Filled 2023-04-23: qty 60, 30d supply, fill #0

## 2023-04-10 MED ORDER — TORSEMIDE 20 MG PO TABS
80.0000 mg | ORAL_TABLET | Freq: Every day | ORAL | 5 refills | Status: DC
  Filled 2023-04-25 – 2023-06-20 (×2): qty 120, 30d supply, fill #0

## 2023-04-10 NOTE — Patient Instructions (Signed)
RedsClip done today.  Labs done today. We will contact you only if your labs are abnormal.  INCREASE Torsemide to 80mg  (4 tablets) by mouth daily.   INCREASE Potassium to ( 2 tablets) by mouth daily.   No other medication changes were made. Please continue all current medications as prescribed.  Your physician recommends that you schedule a follow-up appointment in: 10 days for a lab only appointment and in 2-3 months with Dr. Pamella Pert.   If you have any questions or concerns before your next appointment please send [place] a message through [place] or call our office at [phone].    TO LEAVE A MESSAGE FOR THE NURSE SELECT OPTION 2, PLEASE LEAVE A MESSAGE INCLUDING: YOUR NAME DATE OF BIRTH CALL BACK NUMBER REASON FOR CALL**this is important as we prioritize the call backs  YOU WILL RECEIVE A CALL BACK THE SAME DAY AS LONG AS YOU CALL BEFORE 4:00 PM   Do the following things EVERYDAY: Weigh yourself in the morning before breakfast. Write it down and keep it in a log. Take your medicines as prescribed Eat low salt foods--Limit salt (sodium) to 2000 mg per day.  Stay as active as you can everyday Limit all fluids for the day to less than 2 liters   At the [facility], you and your health needs are our priority. As part of our continuing mission to provide you with exceptional heart care, we have created designated Provider Care Teams. These Care Teams include your primary Cardiologist (physician) and Advanced Practice Providers (APPs- Physician Assistants and Nurse Practitioners) who all work together to provide you with the care you need, when you need it.   You may see any of the following providers on your designated Care Team at your next follow up: Dr Arvilla Meres Dr Marca Ancona Dr. Marcos Eke, NP Robbie Lis, [place] [facility] [place], [place] Brynda Peon, NP Karle Plumber, PharmD   Please be sure to bring in all  your medications bottles to every appointment.    Thank you for choosing [facility]

## 2023-04-10 NOTE — Progress Notes (Signed)
ReDS Vest / Clip - 04/10/23 1424       ReDS Vest / Clip   Station Marker C    Ruler Value 31    ReDS Value Range High volume overload    ReDS Actual Value 42

## 2023-04-11 ENCOUNTER — Other Ambulatory Visit (HOSPITAL_COMMUNITY): Payer: Self-pay

## 2023-04-11 MED ORDER — SIMVASTATIN 20 MG PO TABS
20.0000 mg | ORAL_TABLET | Freq: Every evening | ORAL | 3 refills | Status: DC
  Filled 2023-04-11 – 2023-07-30 (×2): qty 90, 90d supply, fill #0
  Filled 2023-11-25: qty 90, 90d supply, fill #1
  Filled 2024-03-25: qty 90, 90d supply, fill #2

## 2023-04-11 MED ORDER — FREESTYLE LIBRE 3 SENSOR MISC
3 refills | Status: DC
  Filled 2023-04-11: qty 6, 84d supply, fill #0
  Filled 2023-06-20: qty 6, 84d supply, fill #1
  Filled 2023-08-16 – 2023-08-25 (×2): qty 6, 84d supply, fill #2
  Filled 2023-10-30: qty 6, 84d supply, fill #3

## 2023-04-11 MED ORDER — DAPAGLIFLOZIN PROPANEDIOL 10 MG PO TABS
10.0000 mg | ORAL_TABLET | Freq: Every day | ORAL | 3 refills | Status: DC
  Filled 2023-04-11: qty 30, 30d supply, fill #0
  Filled 2023-04-11 – 2023-04-26 (×2): qty 90, 90d supply, fill #0
  Filled 2023-05-04: qty 30, 30d supply, fill #0
  Filled 2023-06-14: qty 30, 30d supply, fill #1
  Filled 2023-06-14: qty 3, 3d supply, fill #1
  Filled 2023-06-15: qty 27, 27d supply, fill #1
  Filled 2023-07-17: qty 30, 30d supply, fill #2
  Filled 2023-08-16: qty 30, 30d supply, fill #3
  Filled 2023-09-12: qty 30, 30d supply, fill #4
  Filled 2023-10-15: qty 30, 30d supply, fill #5
  Filled 2023-11-12: qty 30, 30d supply, fill #6
  Filled 2023-12-15: qty 30, 30d supply, fill #7
  Filled 2024-01-14: qty 30, 30d supply, fill #8
  Filled 2024-02-15 – 2024-02-18 (×2): qty 30, 30d supply, fill #9
  Filled 2024-03-17: qty 30, 30d supply, fill #10

## 2023-04-11 MED ORDER — SIMVASTATIN 20 MG PO TABS
20.0000 mg | ORAL_TABLET | Freq: Every evening | ORAL | 3 refills | Status: DC
  Filled 2023-04-11: qty 90, 90d supply, fill #0

## 2023-04-11 NOTE — Research (Signed)
   SITE #  [id number]          SUBJECT #  [id number]           SUBJECT Initials: FY   90- DAY FOLLOW UP VISIT- Telephone Call     Have there been any changes to the subject's medications?  [x]  YES  []   NO     Update:_increase torsemide 80 mg and potassium 40 daily _____  Did the subject experience any NEW adverse events?  []  YES  [x]   NO     Update:____  Was the subject hospitalized between 30-day and 90-day follow-up visit?   NO   What is the number of home days between 30-day and 90-day follow-up visit?  54 days   Current Outpatient Medications:    acetaminophen (TYLENOL) 500 MG tablet, Take 500 mg by mouth every 6 (six) hours as needed for mild pain., Disp: , Rfl:    Blood Glucose Monitoring Suppl (ONE TOUCH ULTRA SYSTEM KIT) W/DEVICE KIT, 1 kit by Does not apply route once. One touch ultrasoft lancets, Disp: , Rfl:    brimonidine-timolol (COMBIGAN) 0.2-0.5 % ophthalmic solution, Place 1 drop into both eyes every 12 (twelve) hours., Disp: , Rfl:    carvedilol (COREG) 25 MG tablet, Take 1 tablet (25 mg total) by mouth 2 (two) times daily., Disp: 180 tablet, Rfl: 1   Continuous Glucose Sensor (FREESTYLE LIBRE 3 SENSOR) MISC, Apply to upper back of arm. Change every 14 days., Disp: 6 each, Rfl: 3   dapagliflozin propanediol (FARXIGA) 10 MG TABS tablet, Take 1 tablet (10 mg total) by mouth daily., Disp: 30 tablet, Rfl: 0   dapagliflozin propanediol (FARXIGA) 10 MG TABS tablet, Take 1 tablet (10 mg total) by mouth daily., Disp: 90 tablet, Rfl: 3   HUMALOG KWIKPEN 200 UNIT/ML KwikPen, Inject 10-22 Units into the skin 3 (three) times daily. PLUS 4 extra units if blood sugar over 200 mg/dL, Disp: , Rfl:    hydrALAZINE (APRESOLINE) 100 MG tablet, Take 0.5 tablets (50 mg total) by mouth 2 (two) times daily., Disp: 90 tablet, Rfl: 3   insulin glargine (LANTUS) 100 UNIT/ML injection, Inject 38 Units into the skin daily., Disp: , Rfl:    KERENDIA 20 MG TABS, Take 1 tablet by mouth at bedtime., Disp: , Rfl:     levothyroxine (SYNTHROID) 137 MCG tablet, Take 137 mcg by mouth every morning., Disp: , Rfl:    potassium chloride (KLOR-CON) 20 MEQ packet, Take 40 mEq by mouth daily., Disp: 60 packet, Rfl: 5   sacubitril-valsartan (ENTRESTO) 49-51 MG, Take 1 tablet by mouth 2 (two) times daily., Disp: 60 tablet, Rfl: 8   simvastatin (ZOCOR) 20 MG tablet, Take 20 mg by mouth every evening., Disp: , Rfl:    torsemide (DEMADEX) 20 MG tablet, Take 4 tablets (80 mg total) by mouth daily. Patient takes 2 tablets by mouth in morning., Disp: 120 tablet, Rfl: 5

## 2023-04-12 ENCOUNTER — Other Ambulatory Visit (HOSPITAL_COMMUNITY): Payer: Self-pay

## 2023-04-12 ENCOUNTER — Telehealth (HOSPITAL_COMMUNITY): Payer: Self-pay

## 2023-04-12 DIAGNOSIS — I5032 Chronic diastolic (congestive) heart failure: Secondary | ICD-10-CM

## 2023-04-12 NOTE — Telephone Encounter (Signed)
-----   Message from Jacklynn Ganong, [place] sent at 04/12/2023  7:59 AM EDT ----- Renal function stable, K is low. Please confirm he has been taking 40 KCL daily.   If so, take 40 bid x 3 days, then 60 KCL daily thereafter.  Repeat BMET in 7-10 days

## 2023-04-12 NOTE — Telephone Encounter (Signed)
Patient aware of labs and instructions. He verbalized understanding and will call back to set up labs.  Lab order placed.

## 2023-04-15 DIAGNOSIS — I10 Essential (primary) hypertension: Secondary | ICD-10-CM | POA: Diagnosis not present

## 2023-04-15 DIAGNOSIS — R0683 Snoring: Secondary | ICD-10-CM | POA: Diagnosis not present

## 2023-04-15 DIAGNOSIS — G4733 Obstructive sleep apnea (adult) (pediatric): Secondary | ICD-10-CM | POA: Diagnosis not present

## 2023-04-16 ENCOUNTER — Encounter (HOSPITAL_BASED_OUTPATIENT_CLINIC_OR_DEPARTMENT_OTHER): Payer: Self-pay | Admitting: Anesthesiology

## 2023-04-16 ENCOUNTER — Ambulatory Visit: Payer: Self-pay | Admitting: Surgery

## 2023-04-17 ENCOUNTER — Encounter (HOSPITAL_BASED_OUTPATIENT_CLINIC_OR_DEPARTMENT_OTHER): Payer: Self-pay | Admitting: Surgery

## 2023-04-17 ENCOUNTER — Encounter (HOSPITAL_BASED_OUTPATIENT_CLINIC_OR_DEPARTMENT_OTHER)
Admission: RE | Disposition: A | Discharge status: Home / Self Care | Payer: Self-pay | Source: Home / Self Care | Attending: Surgery

## 2023-04-17 ENCOUNTER — Ambulatory Visit (HOSPITAL_BASED_OUTPATIENT_CLINIC_OR_DEPARTMENT_OTHER)
Admission: RE | Admit: 2023-04-17 | Discharge: 2023-04-17 | Disposition: A | Discharge status: Home / Self Care | Payer: Medicare Other | Attending: Surgery | Admitting: Surgery

## 2023-04-17 DIAGNOSIS — L723 Sebaceous cyst: Secondary | ICD-10-CM | POA: Diagnosis not present

## 2023-04-17 HISTORY — PX: LIPOMA EXCISION: SHX5283

## 2023-04-17 LAB — AEROBIC/ANAEROBIC CULTURE W GRAM STAIN (SURGICAL/DEEP WOUND)

## 2023-04-17 SURGERY — MINOR EXCISION LIPOMA
Anesthesia: LOCAL | Site: Buttocks | Laterality: Left

## 2023-04-17 MED ORDER — LIDOCAINE HCL (PF) 1 % IJ SOLN
INTRAMUSCULAR | Status: DC | PRN
  Administered 2023-04-17: 18.5 mL

## 2023-04-17 MED ORDER — AMOXICILLIN-POT CLAVULANATE 500-125 MG PO TABS: 1.0000 | ORAL_TABLET | Freq: Three times a day (TID) | ORAL | 0 refills | Status: AC

## 2023-04-17 MED ORDER — CEPHALEXIN 500 MG PO CAPS
500.0000 mg | ORAL_CAPSULE | Freq: Once | ORAL | Status: AC
  Administered 2023-04-17: 500 mg via ORAL
  Filled 2023-04-17: qty 1

## 2023-04-17 MED ORDER — BUPIVACAINE HCL (PF) 0.5 % IJ SOLN
INTRAMUSCULAR | Status: DC | PRN
  Administered 2023-04-17: 18.5 mL

## 2023-04-17 MED ORDER — CHLORHEXIDINE GLUCONATE CLOTH 2 % EX PADS: 6.0000 | MEDICATED_PAD | Freq: Once | CUTANEOUS | Status: DC

## 2023-04-17 MED ORDER — OXYCODONE-ACETAMINOPHEN 5-325 MG PO TABS: 1.0000 | ORAL_TABLET | ORAL | 0 refills | Status: DC | PRN

## 2023-04-17 MED ORDER — BUPIVACAINE HCL (PF) 0.5 % IJ SOLN
INTRAMUSCULAR | Status: AC
  Filled 2023-04-17: qty 30

## 2023-04-17 MED ORDER — AMOXICILLIN-POT CLAVULANATE 875-125 MG PO TABS: 1.0000 | ORAL_TABLET | Freq: Two times a day (BID) | ORAL | Status: DC

## 2023-04-17 MED ORDER — CEPHALEXIN 500 MG PO CAPS: 500.0000 mg | ORAL_CAPSULE | Freq: Once | ORAL | Status: AC

## 2023-04-17 SURGICAL SUPPLY — 52 items
ADH SKN CLS APL DERMABOND .7 (GAUZE/BANDAGES/DRESSINGS)
APL PRP STRL LF DISP 70% ISPRP (MISCELLANEOUS)
BLADE CLIPPER SURG (BLADE) IMPLANT
BLADE SURG 15 STRL LF DISP TIS (BLADE) ×2 IMPLANT
BLADE SURG 15 STRL SS (BLADE) ×2
BNDG GAUZE DERMACEA FLUFF 4 (GAUZE/BANDAGES/DRESSINGS) ×4 IMPLANT
BNDG GZE DERMACEA 4 6PLY (GAUZE/BANDAGES/DRESSINGS) ×8
CANISTER SUCT 1200ML W/VALVE (MISCELLANEOUS) IMPLANT
CHLORAPREP W/TINT 26 (MISCELLANEOUS) ×1 IMPLANT
CLSR STERI-STRIP ANTIMIC 1/2X4 (GAUZE/BANDAGES/DRESSINGS) IMPLANT
COVER BACK TABLE 60X90IN (DRAPES) ×2 IMPLANT
COVER MAYO STAND STRL (DRAPES) ×2 IMPLANT
DERMABOND ADVANCED .7 DNX12 (GAUZE/BANDAGES/DRESSINGS) ×1 IMPLANT
DRAPE LAPAROTOMY 100X72 PEDS (DRAPES) ×2 IMPLANT
DRAPE UTILITY XL STRL (DRAPES) ×2 IMPLANT
DRSG TEGADERM 4X4.75 (GAUZE/BANDAGES/DRESSINGS) IMPLANT
ELECT COATED BLADE 2.86 ST (ELECTRODE) IMPLANT
ELECT REM PT RETURN 9FT ADLT (ELECTROSURGICAL) ×2
ELECTRODE REM PT RTRN 9FT ADLT (ELECTROSURGICAL) ×2 IMPLANT
GAUZE PACKING IODOFORM 1/4X15 (PACKING) IMPLANT
GAUZE PAD ABD 8X10 STRL (GAUZE/BANDAGES/DRESSINGS) ×5 IMPLANT
GAUZE SPONGE 4X4 12PLY STRL LF (GAUZE/BANDAGES/DRESSINGS) IMPLANT
GLOVE BIOGEL PI IND STRL 8 (GLOVE) ×2 IMPLANT
GOWN STRL REUS W/ TWL LRG LVL3 (GOWN DISPOSABLE) ×4 IMPLANT
GOWN STRL REUS W/ TWL XL LVL3 (GOWN DISPOSABLE) ×2 IMPLANT
GOWN STRL REUS W/TWL LRG LVL3 (GOWN DISPOSABLE) ×2
GOWN STRL REUS W/TWL XL LVL3 (GOWN DISPOSABLE) ×2
KIT MARKER MARGIN INK (KITS) IMPLANT
NDL HYPO 25X1 1.5 SAFETY (NEEDLE) ×1 IMPLANT
NEEDLE HYPO 25X1 1.5 SAFETY (NEEDLE) ×2 IMPLANT
NS IRRIG 1000ML POUR BTL (IV SOLUTION) ×2 IMPLANT
PACK BASIN DAY SURGERY FS (CUSTOM PROCEDURE TRAY) ×2 IMPLANT
PENCIL SMOKE EVACUATOR (MISCELLANEOUS) ×2 IMPLANT
SLEEVE SCD COMPRESS KNEE MED (STOCKING) ×1 IMPLANT
SPIKE FLUID TRANSFER (MISCELLANEOUS) IMPLANT
SPONGE T-LAP 4X18 ~~LOC~~+RFID (SPONGE) ×4 IMPLANT
SUT ETHILON 2 0 FS 18 (SUTURE) IMPLANT
SUT MNCRL AB 4-0 PS2 18 (SUTURE) IMPLANT
SUT SILK 2 0 SH (SUTURE) IMPLANT
SUT VIC AB 2-0 SH 27 (SUTURE)
SUT VIC AB 2-0 SH 27XBRD (SUTURE) IMPLANT
SUT VIC AB 3-0 SH 27 (SUTURE)
SUT VIC AB 3-0 SH 27X BRD (SUTURE) IMPLANT
SUT VIC AB 4-0 PS2 18 (SUTURE) IMPLANT
SUT VICRYL 3-0 CR8 SH (SUTURE) IMPLANT
SWAB COLLECTION DEVICE MRSA (MISCELLANEOUS) ×1 IMPLANT
SWAB CULTURE ESWAB REG 1ML (MISCELLANEOUS) ×1 IMPLANT
SYR CONTROL 10ML LL (SYRINGE) ×2 IMPLANT
TOWEL GREEN STERILE FF (TOWEL DISPOSABLE) ×2 IMPLANT
TUBE CONNECTING 20X1/4 (TUBING) ×1 IMPLANT
UNDERPAD 30X36 HEAVY ABSORB (UNDERPADS AND DIAPERS) ×1 IMPLANT
YANKAUER SUCT BULB TIP NO VENT (SUCTIONS) IMPLANT

## 2023-04-17 NOTE — H&P (Signed)
Admitting Physician: Hyman Hopes Anjolina Byrer  Service: General surgery  CC: Sebaceous cyst  Subjective   HPI: Brendan [name]. is an 67 y.o. male who is here for sebaceous cyst removal  Past Medical History:  Diagnosis Date   Acanthosis nigricans    Atopic dermatitis    Diabetes (HCC) 2004   Erectile dysfunction    Fatty liver 2007   Glaucoma 2008   Gynecomastia 2009   Hyperaldosteronism (HCC) 2000   Hypercholesterolemia    Hyperlipidemia    Hypertension    Hypoglycemic reaction    Hypothyroidism 2004   Incontinence    Intermittent vertigo 2012   Left cervical radiculopathy 2012   Lumbar radiculopathy    Obesity    Peripheral neuropathy    Pollen allergies 2007   perennial   Prostate cancer (HCC) 2006   Reflux esophagitis 1995   Sickle cell trait (HCC) 2006   Sleep apnea, obstructive 2000   uses a cpap   Venous insufficiency 2005   Vitamin D deficiency 2012    Past Surgical History:  Procedure Laterality Date   COLONOSCOPY  2006   normal   lap band surgery  2009   left inguinal hernia repair  1998   robotic prostatectomy  2008    Family History  Problem Relation Age of Onset   Hypertension Father        deceased age 16   Heart attack Father    Heart failure Mother    COPD Sister    CVA Sister    Diabetes Daughter    Colon cancer Neg Hx    Stomach cancer Neg Hx     Social:  reports that he has never smoked. He has never used smokeless tobacco. He reports current alcohol use. He reports that he does not use drugs.  Allergies:  Allergies  Allergen Reactions   Ace Inhibitors Cough   Maxidex [Dexamethasone] Other (See Comments)    Sexual dysfunction   Verapamil Other (See Comments)    constipation    Medications: Current Outpatient Medications  Medication Instructions   acetaminophen (TYLENOL) 500 mg, Oral, Every 6 hours PRN   Blood Glucose Monitoring Suppl (ONE TOUCH ULTRA SYSTEM KIT) W/DEVICE KIT 1 kit, Does not apply,  Once, One touch  ultrasoft lancets    brimonidine-timolol (COMBIGAN) 0.2-0.5 % ophthalmic solution 1 drop, Both Eyes, Every 12 hours   carvedilol (COREG) 25 mg, Oral, 2 times daily   Continuous Glucose Sensor (FREESTYLE LIBRE 3 SENSOR) MISC Apply to upper back of arm. Change every 14 days.   dapagliflozin propanediol (FARXIGA) 10 mg, Oral, Daily   dapagliflozin propanediol (FARXIGA) 10 mg, Oral, Daily   HumaLOG KwikPen 10-22 Units, Subcutaneous, 3 times daily, PLUS 4 extra units if blood sugar over 200 mg/dL <BR>   hydrALAZINE (APRESOLINE) 50 mg, Oral, 2 times daily   insulin glargine (LANTUS) 38 Units, Subcutaneous, Daily   KERENDIA 20 MG TABS 1 tablet, Oral, Nightly   levothyroxine (SYNTHROID) 137 mcg, Oral, Every morning   potassium chloride (KLOR-CON) 20 MEQ packet 40 mEq, Oral, Daily   sacubitril-valsartan (ENTRESTO) 49-51 MG 1 tablet, Oral, 2 times daily   simvastatin (ZOCOR) 20 mg, Oral, Every evening   simvastatin (ZOCOR) 20 mg, Oral, Every evening   simvastatin (ZOCOR) 20 mg, Oral, Every evening   torsemide (DEMADEX) 80 mg, Oral, Daily, Patient takes 2 tablets by mouth in morning.    ROS - all of the below systems have been reviewed with the patient and positives  are indicated with bold text General: chills, fever or night sweats Eyes: blurry vision or double vision ENT: epistaxis or sore throat Allergy/Immunology: itchy/watery eyes or nasal congestion Hematologic/Lymphatic: bleeding problems, blood clots or swollen lymph nodes Endocrine: temperature intolerance or unexpected weight changes Breast: new or changing breast lumps or nipple discharge Resp: cough, shortness of breath, or wheezing CV: chest pain or dyspnea on exertion GI: as per HPI GU: dysuria, trouble voiding, or hematuria MSK: joint pain or joint stiffness Neuro: TIA or stroke symptoms Derm: pruritus and skin lesion changes Psych: anxiety and depression  Objective   PE Height 6\' 2"  (1.88 m), weight 133.4  kg. Constitutional: NAD; conversant; no deformities Eyes: Moist conjunctiva; no lid lag; anicteric; PERRL Neck: Trachea midline; no thyromegaly Lungs: Normal respiratory effort; no tactile fremitus CV: RRR; no palpable thrills; no pitting edema GI: Abd soft, nontender; no palpable hepatosplenomegaly MSK: Normal range of motion of extremities; no clubbing/cyanosis Psychiatric: Appropriate affect; alert and oriented x3 Lymphatic: No palpable cervical or axillary lymphadenopathy  No results found for this or any previous visit (from the past 24 hour(s)).  Imaging Orders  No imaging studies ordered today     Assessment and Plan   Brendan [name]. is an 67 y.o. male with a large left buttock sebaceous cyst.  I recommended removal.  We discussed the procedure, its risks, benefits and alternatives.  After a full discussion and all questions answered the patient granted consent to proceed.  We will proceed as scheduled.   Quentin Ore, MD  [facility] Use [email or web address] to contact on call provider

## 2023-04-17 NOTE — Op Note (Addendum)
   Patient: Brendan [name]. ([birth year], [id number])  Date of Surgery: 04/17/2023   Preoperative Diagnosis: LEFT BUTTOCK SEBACEOUS CYST   Postoperative Diagnosis: CHRONICALLY INFECTED LEFT BUTTOCK SEBACEOUS CYST WITH LARGE CHRONIC SUBCUTANEOUS CAVITY WHICH WAS PACKED AND MEASURED 10 CM TALL X 8 CM WIDE X 2 CM DEEP  Surgical Procedure: EXCISION OF LEFT BUTTOCK SEBACEOUS CYST  Operative Team Members:  Surgeon(s) and Role:    * Anaia Frith, Hyman Hopes, MD - Primary   LOCAL  Anesthesia: LOCAL   Fluids:  NONE  Complications: * No complications entered in OR log *  Drains:  none   Specimen: * No specimens in log *   Disposition:  PACU - hemodynamically stable.  Plan of Care: Discharge to home after PACU    Indications for Procedure: Brendan [name]. is a 67 y.o. male who presented with a left buttock sebacous cyst.  I recommended removal.  The procedure itself as well as its risks, benefits and alternatives were discussed.  The risks discussed included but were not limited to the risk of infection, bleeding, damage to nearby structures, and need for packing of the wound.  After a full discussion and all questions answered the patient granted consent to proceed.    Findings: Left buttock sebaceous cyst   Description of Procedure:   On the date stated above the patient was placed in right lateral positioning.  Antibiotics were given prior to the case start.  Local was injected. An incision was made over the cyst and a large cavity was encountered.  The incision was enlarged in order to try to excise any connection between the cyst cavity and the skin and open the wound to adequately accept packing.  I attempted to remove any abnormal cyst tissue.  The wound was irrigated with hydrogen peroxide.  The wound measured 10 CM TALL X 8 CM WIDE X 2 CM DEEP.  The wound was left open and packed with kerlex gauze.  The patient tolerated the procedure well.    At the end of the case we reviewed  the infection status of the case. Patient: Private Patient Elective Case Case: Elective Infection Present At Time Of Surgery (PATOS):  Infected sebaceous cyst  Ivar Drape, MD General, Bariatric, & Minimally Invasive Surgery [facility], [place]

## 2023-04-17 NOTE — Discharge Instructions (Signed)
Come back to the office for dressing change. They will call to set this up  Change packing 24hrs after (04/18/23)

## 2023-04-18 ENCOUNTER — Encounter (HOSPITAL_BASED_OUTPATIENT_CLINIC_OR_DEPARTMENT_OTHER): Payer: Self-pay | Admitting: Surgery

## 2023-04-18 NOTE — Research (Signed)
   36 Hours Post Treatment Initiation SITE #   001            SUBJECT #: [id number]              Treatment Assessment: (36 hours after Treatment initiation or Therapy End IF < 36 hours)  Date/Time of assessment:  __22__/_FEB_/_2024__ at __06_:_01__  Estimated excess fluid volume:___________________ lbs/kg  FLUID BALANCE:   Date/Time of Assessment _22_/_FEB_/_2024_   at _06_:_01_   Input DMS total infusion         mL [x]  N/A  Other IV fluid total     39    mL []  N/A  Oral fluid total    1920     mL []  N/A  Total input    1959     mL []  N/A   Output Total Urine    8225     mL []  N/A  Missed Urine     0    mL []  N/A  Other Fluid Output     0    mL []  N/A  Total output   8225      mL []  N/A   Please indicate if the following has occurred since subject's last assessment. (Only applicable 12h from initial visit to Discharge visit)  Acute kidney injury, defined as use of renal replacement therapy (RRT). RRT includes hemodialysis, hemofiltration, hemodiafiltration, peritoneal dialysis, and kidney transplant. Isolated ultrafiltration without dialysis will not be considered RRT. *Note- acute kidney injury defined as KDIGO stage 2 or greater is captured in the Serum Chemistry labs CRF  []  yes [x]  no  Symptomatic hypotension, defined as sustained hypotension (<80 mmHg systolic) with corresponding symptoms that require intervention(i.e., fluid bolus or vasoactive medication) []  yes  [x]  no  Hypertensive emergency, defined as blood pressure greater than 180/120 mmHg with end-organ damage (cardiovascular, cerebrovascular or renal)   []  yes   [x]  no

## 2023-04-19 LAB — AEROBIC/ANAEROBIC CULTURE W GRAM STAIN (SURGICAL/DEEP WOUND)

## 2023-04-22 ENCOUNTER — Ambulatory Visit (HOSPITAL_COMMUNITY)
Admission: RE | Admit: 2023-04-22 | Discharge: 2023-04-22 | Disposition: A | Discharge status: Home / Self Care | Payer: Commercial Managed Care - PPO | Source: Ambulatory Visit | Attending: Cardiology | Admitting: Cardiology

## 2023-04-22 ENCOUNTER — Other Ambulatory Visit (HOSPITAL_COMMUNITY): Payer: Self-pay

## 2023-04-22 DIAGNOSIS — I5032 Chronic diastolic (congestive) heart failure: Secondary | ICD-10-CM

## 2023-04-22 LAB — BASIC METABOLIC PANEL
Anion gap: 10 (ref 5–15)
BUN: 41 mg/dL — ABNORMAL HIGH (ref 8–23)
CO2: 24 mmol/L (ref 22–32)
Calcium: 8.4 mg/dL — ABNORMAL LOW (ref 8.9–10.3)
Chloride: 102 mmol/L (ref 98–111)
Creatinine, Ser: 2.74 mg/dL — ABNORMAL HIGH (ref 0.61–1.24)
GFR, Estimated: 25 mL/min — ABNORMAL LOW (ref >60)
Glucose, Bld: 169 mg/dL — ABNORMAL HIGH (ref 70–99)
Potassium: 4.9 mmol/L (ref 3.5–5.1)
Sodium: 136 mmol/L (ref 135–145)

## 2023-04-22 LAB — AEROBIC/ANAEROBIC CULTURE W GRAM STAIN (SURGICAL/DEEP WOUND)

## 2023-04-22 MED ORDER — KERENDIA 20 MG PO TABS
20.0000 mg | ORAL_TABLET | Freq: Every morning | ORAL | 3 refills | Status: DC
  Filled 2023-04-22 – 2023-06-11 (×3): qty 90, 90d supply, fill #0
  Filled 2023-09-12: qty 30, 30d supply, fill #1
  Filled 2023-09-13: qty 60, 60d supply, fill #1

## 2023-04-23 ENCOUNTER — Other Ambulatory Visit (HOSPITAL_COMMUNITY): Payer: Self-pay

## 2023-04-23 ENCOUNTER — Other Ambulatory Visit (HOSPITAL_BASED_OUTPATIENT_CLINIC_OR_DEPARTMENT_OTHER): Payer: Self-pay

## 2023-04-24 ENCOUNTER — Other Ambulatory Visit: Payer: Self-pay

## 2023-04-24 ENCOUNTER — Other Ambulatory Visit (HOSPITAL_COMMUNITY): Payer: Self-pay

## 2023-04-25 ENCOUNTER — Other Ambulatory Visit (HOSPITAL_COMMUNITY): Payer: Self-pay

## 2023-04-25 MED ORDER — LEVOTHYROXINE SODIUM 137 MCG PO TABS
137.0000 ug | ORAL_TABLET | Freq: Every morning | ORAL | 11 refills | Status: DC
  Filled 2023-04-25 – 2023-06-20 (×2): qty 30, 30d supply, fill #0

## 2023-04-25 MED ORDER — CARVEDILOL 25 MG PO TABS
25.0000 mg | ORAL_TABLET | Freq: Two times a day (BID) | ORAL | 3 refills | Status: DC
  Filled 2023-04-25 – 2023-06-01 (×2): qty 180, 90d supply, fill #0
  Filled 2023-09-04 – 2023-09-05 (×2): qty 180, 90d supply, fill #1
  Filled 2023-12-21 – 2023-12-25 (×2): qty 180, 90d supply, fill #2

## 2023-04-25 MED ORDER — TECHLITE PEN NEEDLES 32G X 4 MM MISC
4 refills | Status: DC
  Filled 2023-04-25: qty 100, 25d supply, fill #0
  Filled 2023-06-20: qty 100, 25d supply, fill #1
  Filled 2023-09-05: qty 100, 25d supply, fill #2
  Filled 2023-10-09: qty 100, 25d supply, fill #3

## 2023-04-25 MED ORDER — POTASSIUM CHLORIDE CRYS ER 20 MEQ PO TBCR
40.0000 meq | EXTENDED_RELEASE_TABLET | Freq: Every day | ORAL | 3 refills | Status: DC
  Filled 2023-04-25 – 2023-06-14 (×2): qty 90, 45d supply, fill #0
  Filled 2023-08-25: qty 90, 45d supply, fill #1
  Filled 2023-10-15: qty 90, 45d supply, fill #2
  Filled 2023-12-02: qty 90, 45d supply, fill #3

## 2023-04-25 MED ORDER — SIMVASTATIN 20 MG PO TABS
20.0000 mg | ORAL_TABLET | Freq: Every evening | ORAL | 3 refills | Status: DC
  Filled 2023-04-25: qty 90, 90d supply, fill #0

## 2023-04-25 MED ORDER — DAPAGLIFLOZIN PROPANEDIOL 10 MG PO TABS: 10.0000 mg | ORAL_TABLET | Freq: Every day | ORAL | 4 refills | Status: DC

## 2023-04-26 ENCOUNTER — Other Ambulatory Visit (HOSPITAL_COMMUNITY): Payer: Self-pay

## 2023-04-26 MED ORDER — SACUBITRIL-VALSARTAN 49-51 MG PO TABS
1.0000 | ORAL_TABLET | Freq: Two times a day (BID) | ORAL | 8 refills | Status: DC
  Filled 2023-04-26: qty 60, 30d supply, fill #0
  Filled 2023-06-01: qty 60, 30d supply, fill #1
  Filled 2023-07-05: qty 60, 30d supply, fill #2

## 2023-04-27 ENCOUNTER — Other Ambulatory Visit (HOSPITAL_COMMUNITY): Payer: Self-pay

## 2023-04-29 ENCOUNTER — Other Ambulatory Visit (HOSPITAL_COMMUNITY): Payer: Self-pay

## 2023-05-04 ENCOUNTER — Other Ambulatory Visit (HOSPITAL_COMMUNITY): Payer: Self-pay

## 2023-05-14 ENCOUNTER — Other Ambulatory Visit (HOSPITAL_COMMUNITY): Payer: Self-pay

## 2023-05-15 ENCOUNTER — Other Ambulatory Visit (HOSPITAL_COMMUNITY): Payer: Self-pay

## 2023-05-16 DIAGNOSIS — G4733 Obstructive sleep apnea (adult) (pediatric): Secondary | ICD-10-CM | POA: Diagnosis not present

## 2023-05-16 DIAGNOSIS — R0683 Snoring: Secondary | ICD-10-CM | POA: Diagnosis not present

## 2023-05-16 DIAGNOSIS — I10 Essential (primary) hypertension: Secondary | ICD-10-CM | POA: Diagnosis not present

## 2023-05-20 DIAGNOSIS — E1142 Type 2 diabetes mellitus with diabetic polyneuropathy: Secondary | ICD-10-CM | POA: Diagnosis not present

## 2023-05-20 DIAGNOSIS — N1832 Chronic kidney disease, stage 3b: Secondary | ICD-10-CM | POA: Diagnosis not present

## 2023-05-20 DIAGNOSIS — E1122 Type 2 diabetes mellitus with diabetic chronic kidney disease: Secondary | ICD-10-CM | POA: Diagnosis not present

## 2023-05-20 DIAGNOSIS — Z794 Long term (current) use of insulin: Secondary | ICD-10-CM | POA: Diagnosis not present

## 2023-05-20 DIAGNOSIS — E039 Hypothyroidism, unspecified: Secondary | ICD-10-CM | POA: Diagnosis not present

## 2023-05-20 DIAGNOSIS — E1165 Type 2 diabetes mellitus with hyperglycemia: Secondary | ICD-10-CM | POA: Diagnosis not present

## 2023-05-24 ENCOUNTER — Other Ambulatory Visit (HOSPITAL_COMMUNITY): Payer: Self-pay

## 2023-05-24 MED ORDER — LEVOTHYROXINE SODIUM 125 MCG PO TABS
125.0000 ug | ORAL_TABLET | ORAL | 11 refills | Status: DC
  Filled 2023-05-24: qty 30, 30d supply, fill #0
  Filled 2023-07-01: qty 30, 30d supply, fill #1
  Filled 2023-07-30: qty 30, 30d supply, fill #2
  Filled 2023-09-04 – 2023-09-05 (×2): qty 30, 30d supply, fill #3
  Filled 2023-10-09: qty 30, 30d supply, fill #4
  Filled 2023-10-30 – 2023-11-01 (×2): qty 30, 30d supply, fill #5
  Filled 2023-12-02: qty 30, 30d supply, fill #6
  Filled 2024-01-07: qty 30, 30d supply, fill #7
  Filled 2024-02-04: qty 30, 30d supply, fill #8
  Filled 2024-03-02: qty 30, 30d supply, fill #9
  Filled 2024-03-31 – 2024-04-03 (×2): qty 30, 30d supply, fill #10
  Filled 2024-05-11: qty 30, 30d supply, fill #11

## 2023-05-29 ENCOUNTER — Other Ambulatory Visit (HOSPITAL_COMMUNITY): Payer: Self-pay

## 2023-05-31 ENCOUNTER — Other Ambulatory Visit (HOSPITAL_COMMUNITY): Payer: Self-pay

## 2023-05-31 DIAGNOSIS — N2581 Secondary hyperparathyroidism of renal origin: Secondary | ICD-10-CM | POA: Diagnosis not present

## 2023-05-31 DIAGNOSIS — I129 Hypertensive chronic kidney disease with stage 1 through stage 4 chronic kidney disease, or unspecified chronic kidney disease: Secondary | ICD-10-CM | POA: Diagnosis not present

## 2023-05-31 DIAGNOSIS — I5032 Chronic diastolic (congestive) heart failure: Secondary | ICD-10-CM | POA: Diagnosis not present

## 2023-05-31 DIAGNOSIS — R809 Proteinuria, unspecified: Secondary | ICD-10-CM | POA: Diagnosis not present

## 2023-05-31 DIAGNOSIS — E1122 Type 2 diabetes mellitus with diabetic chronic kidney disease: Secondary | ICD-10-CM | POA: Diagnosis not present

## 2023-05-31 DIAGNOSIS — N184 Chronic kidney disease, stage 4 (severe): Secondary | ICD-10-CM | POA: Diagnosis not present

## 2023-06-01 ENCOUNTER — Other Ambulatory Visit (HOSPITAL_COMMUNITY): Payer: Self-pay

## 2023-06-03 ENCOUNTER — Other Ambulatory Visit (HOSPITAL_COMMUNITY): Payer: Self-pay

## 2023-06-06 ENCOUNTER — Encounter: Payer: Self-pay | Admitting: *Deleted

## 2023-06-10 DIAGNOSIS — N184 Chronic kidney disease, stage 4 (severe): Secondary | ICD-10-CM | POA: Diagnosis not present

## 2023-06-11 ENCOUNTER — Other Ambulatory Visit (HOSPITAL_COMMUNITY): Payer: Self-pay

## 2023-06-12 ENCOUNTER — Other Ambulatory Visit (HOSPITAL_COMMUNITY): Payer: Self-pay

## 2023-06-14 ENCOUNTER — Other Ambulatory Visit (HOSPITAL_COMMUNITY): Payer: Self-pay

## 2023-06-15 ENCOUNTER — Other Ambulatory Visit (HOSPITAL_COMMUNITY): Payer: Self-pay

## 2023-06-15 DIAGNOSIS — I10 Essential (primary) hypertension: Secondary | ICD-10-CM | POA: Diagnosis not present

## 2023-06-15 DIAGNOSIS — R0683 Snoring: Secondary | ICD-10-CM | POA: Diagnosis not present

## 2023-06-15 DIAGNOSIS — G4733 Obstructive sleep apnea (adult) (pediatric): Secondary | ICD-10-CM | POA: Diagnosis not present

## 2023-06-17 ENCOUNTER — Other Ambulatory Visit (HOSPITAL_COMMUNITY): Payer: Self-pay

## 2023-06-18 ENCOUNTER — Other Ambulatory Visit (HOSPITAL_COMMUNITY): Payer: Self-pay

## 2023-06-18 DIAGNOSIS — H401131 Primary open-angle glaucoma, bilateral, mild stage: Secondary | ICD-10-CM | POA: Diagnosis not present

## 2023-06-18 DIAGNOSIS — H2513 Age-related nuclear cataract, bilateral: Secondary | ICD-10-CM | POA: Diagnosis not present

## 2023-06-20 ENCOUNTER — Other Ambulatory Visit (HOSPITAL_COMMUNITY): Payer: Self-pay

## 2023-06-20 DIAGNOSIS — Z794 Long term (current) use of insulin: Secondary | ICD-10-CM | POA: Diagnosis not present

## 2023-06-20 DIAGNOSIS — E039 Hypothyroidism, unspecified: Secondary | ICD-10-CM | POA: Diagnosis not present

## 2023-06-20 DIAGNOSIS — E7800 Pure hypercholesterolemia, unspecified: Secondary | ICD-10-CM | POA: Diagnosis not present

## 2023-06-20 DIAGNOSIS — I130 Hypertensive heart and chronic kidney disease with heart failure and stage 1 through stage 4 chronic kidney disease, or unspecified chronic kidney disease: Secondary | ICD-10-CM | POA: Diagnosis not present

## 2023-06-20 DIAGNOSIS — E1165 Type 2 diabetes mellitus with hyperglycemia: Secondary | ICD-10-CM | POA: Diagnosis not present

## 2023-06-20 DIAGNOSIS — E1142 Type 2 diabetes mellitus with diabetic polyneuropathy: Secondary | ICD-10-CM | POA: Diagnosis not present

## 2023-06-20 DIAGNOSIS — Z0000 Encounter for general adult medical examination without abnormal findings: Secondary | ICD-10-CM | POA: Diagnosis not present

## 2023-06-21 ENCOUNTER — Other Ambulatory Visit (HOSPITAL_COMMUNITY): Payer: Self-pay

## 2023-07-01 ENCOUNTER — Encounter (HOSPITAL_COMMUNITY): Payer: Self-pay | Admitting: Internal Medicine

## 2023-07-02 ENCOUNTER — Other Ambulatory Visit (HOSPITAL_COMMUNITY): Payer: Self-pay

## 2023-07-04 ENCOUNTER — Other Ambulatory Visit (HOSPITAL_COMMUNITY): Payer: Self-pay

## 2023-07-05 ENCOUNTER — Other Ambulatory Visit: Payer: Self-pay

## 2023-07-05 DIAGNOSIS — E039 Hypothyroidism, unspecified: Secondary | ICD-10-CM | POA: Diagnosis not present

## 2023-07-08 ENCOUNTER — Other Ambulatory Visit (HOSPITAL_COMMUNITY): Payer: Self-pay

## 2023-07-11 ENCOUNTER — Encounter (HOSPITAL_COMMUNITY): Payer: Self-pay | Admitting: Internal Medicine

## 2023-07-11 ENCOUNTER — Ambulatory Visit (HOSPITAL_COMMUNITY)
Admission: RE | Admit: 2023-07-11 | Discharge: 2023-07-11 | Disposition: A | Discharge status: Home / Self Care | Payer: Medicare Other | Source: Ambulatory Visit | Attending: Internal Medicine | Admitting: Internal Medicine

## 2023-07-11 VITALS — BP 140/78 | HR 56 | Wt 310.8 lb

## 2023-07-11 DIAGNOSIS — Z7984 Long term (current) use of oral hypoglycemic drugs: Secondary | ICD-10-CM | POA: Insufficient documentation

## 2023-07-11 DIAGNOSIS — E1122 Type 2 diabetes mellitus with diabetic chronic kidney disease: Secondary | ICD-10-CM | POA: Insufficient documentation

## 2023-07-11 DIAGNOSIS — I10 Essential (primary) hypertension: Secondary | ICD-10-CM

## 2023-07-11 DIAGNOSIS — Z6839 Body mass index (BMI) 39.0-39.9, adult: Secondary | ICD-10-CM | POA: Diagnosis not present

## 2023-07-11 DIAGNOSIS — E118 Type 2 diabetes mellitus with unspecified complications: Secondary | ICD-10-CM

## 2023-07-11 DIAGNOSIS — E669 Obesity, unspecified: Secondary | ICD-10-CM | POA: Insufficient documentation

## 2023-07-11 DIAGNOSIS — Z79899 Other long term (current) drug therapy: Secondary | ICD-10-CM | POA: Insufficient documentation

## 2023-07-11 DIAGNOSIS — I130 Hypertensive heart and chronic kidney disease with heart failure and stage 1 through stage 4 chronic kidney disease, or unspecified chronic kidney disease: Secondary | ICD-10-CM | POA: Diagnosis not present

## 2023-07-11 DIAGNOSIS — I5032 Chronic diastolic (congestive) heart failure: Secondary | ICD-10-CM

## 2023-07-11 DIAGNOSIS — Z794 Long term (current) use of insulin: Secondary | ICD-10-CM | POA: Diagnosis not present

## 2023-07-11 DIAGNOSIS — N184 Chronic kidney disease, stage 4 (severe): Secondary | ICD-10-CM | POA: Diagnosis not present

## 2023-07-11 DIAGNOSIS — E039 Hypothyroidism, unspecified: Secondary | ICD-10-CM | POA: Insufficient documentation

## 2023-07-11 DIAGNOSIS — G4733 Obstructive sleep apnea (adult) (pediatric): Secondary | ICD-10-CM

## 2023-07-11 DIAGNOSIS — R0609 Other forms of dyspnea: Secondary | ICD-10-CM | POA: Insufficient documentation

## 2023-07-11 DIAGNOSIS — E1142 Type 2 diabetes mellitus with diabetic polyneuropathy: Secondary | ICD-10-CM | POA: Insufficient documentation

## 2023-07-11 LAB — BASIC METABOLIC PANEL
Anion gap: 10 (ref 5–15)
BUN: 32 mg/dL — ABNORMAL HIGH (ref 8–23)
CO2: 25 mmol/L (ref 22–32)
Calcium: 8.6 mg/dL — ABNORMAL LOW (ref 8.9–10.3)
Chloride: 102 mmol/L (ref 98–111)
Creatinine, Ser: 2.68 mg/dL — ABNORMAL HIGH (ref 0.61–1.24)
GFR, Estimated: 25 mL/min — ABNORMAL LOW (ref >60)
Glucose, Bld: 125 mg/dL — ABNORMAL HIGH (ref 70–99)
Potassium: 3.6 mmol/L (ref 3.5–5.1)
Sodium: 137 mmol/L (ref 135–145)

## 2023-07-11 LAB — BRAIN NATRIURETIC PEPTIDE: B Natriuretic Peptide: 521.2 pg/mL — ABNORMAL HIGH (ref 0.0–100.0)

## 2023-07-11 NOTE — Patient Instructions (Signed)
There has been no changes to your medications.  Labs done today, your results will be available in MyChart, we will contact you for abnormal readings.  You have been referred to the [facility]. They will call you to arrange your appointment.  Your physician recommends that you schedule a follow-up appointment in: 6 months ( February 2025) **PLEASE CALL THE OFFICE IN NOVEMBER TO ARRANGE YOUR FOLLOW UP APPOINTMENT. **  If you have any questions or concerns before your next appointment please send [place] a message through [place] or call our office at [phone].    TO LEAVE A MESSAGE FOR THE NURSE SELECT OPTION 2, PLEASE LEAVE A MESSAGE INCLUDING: YOUR NAME DATE OF BIRTH CALL BACK NUMBER REASON FOR CALL**this is important as we prioritize the call backs  YOU WILL RECEIVE A CALL BACK THE SAME DAY AS LONG AS YOU CALL BEFORE 4:00 PM  At the [facility], you and your health needs are our priority. As part of our continuing mission to provide you with exceptional heart care, we have created designated Provider Care Teams. These Care Teams include your primary Cardiologist (physician) and Advanced Practice Providers (APPs- Physician Assistants and Nurse Practitioners) who all work together to provide you with the care you need, when you need it.   You may see any of the following providers on your designated Care Team at your next follow up: Dr Arvilla Meres Dr Marca Ancona Dr. Marcos Eke, NP Robbie Lis, [place] [facility] [place], [place] Brynda Peon, NP Karle Plumber, PharmD   Please be sure to bring in all your medications bottles to every appointment.    Thank you for choosing [facility]

## 2023-07-11 NOTE — Progress Notes (Signed)
ADVANCED HF CLINIC NOTE  PCP: Dr. Nehemiah Settle HF Cardiologist: Dr. Gala Romney  HPI: Brendan [name] is a 67 y.o. male with history of HFpEF, HTN, OSA on CPAP, DM2, hypothyroidism, CKD 3b (creatinine ~2.4) and obesity.   Admitted to [facility] 03/25/20 with A/C diastolic heart failure. Diuresed with IV lasix and transitioned to torsemide 40 bid. Discharge weight 301 pounds.   Admitted 01/07/2023 with A/C HFpEF. High sodium diet played a role in volume overload. Diuresed with IV lasix. Overall diuresed 23 pounds. Transitioned to torsemide 80 mg daily. GDMT adjusted. Enrolled in FASTR trial usual care arm. Discharge weight 294 pounds.   PCP follow up,was told to take hydralazine to 100 mg daily and cut back torsemide to 20 mg daily (due to elevated creatinine). 01/11/23 SCr 3.3, BUN 58, K 3.9.   Follow up 2/24, chronic NYHA II and volume stable on torsemide 20 mg daily. Weight 300 lbs.   Follow up 3/24, with NYHA II-early III, weight up 16 lbs and volume up. Entresto restarted and torsemide increased to 80 daily x 3 days, then 60 daily thereafter.  Follow up 4/24, NYHA II-early III, weight down 10 lbs, REDs 40%. Struggling with orthostasis and amlodipine stopped.   Today he returns for HF follow up. Overall feeling ok. Says he can go to the store and walk with shopping cart but very hard to do yard work or walk down the street due to DOE and fatigue. Gets around 3,000 per day. No CP, edema, orthopnea or PND. BP 120/60 at home.    Cardiac Studies - Echo (2024): EF 55-60% RV ok. Grade II DD.  - Echo (2023): EF preserved Grade 1 DD.  - Echo (5/21): EF 55-60% Grade II DD. Moderate LVH , RV normal   Past Medical History:  Diagnosis Date   Acanthosis nigricans    Atopic dermatitis    CKD (chronic kidney disease) stage 4, GFR 15-29 ml/min (HCC) 01/08/2023   Diabetes (HCC) 11/19/2002   Erectile dysfunction    Fatty liver 11/19/2005   Glaucoma 11/19/2006   Gynecomastia 11/20/2007   Hyperaldosteronism (HCC)  11/19/1998   Hypercholesterolemia    Hyperlipidemia 2010   Hypertension 1999   Hypoglycemic reaction    Hypothyroidism 11/19/2002   Incontinence    Intermittent vertigo 11/19/2010   Left cervical radiculopathy 11/19/2010   Lumbar radiculopathy    Obesity    Peripheral neuropathy    Pollen allergies 11/19/2005   perennial   Prostate cancer (HCC) 11/19/2004   Reflux esophagitis 11/19/1993   Sickle cell trait (HCC) 11/19/2004   Sleep apnea, obstructive 11/19/1998   uses a cpap   Venous insufficiency 11/20/2003   Vitamin D deficiency 11/19/2010   Current Outpatient Medications  Medication Sig Dispense Refill   acetaminophen (TYLENOL) 500 MG tablet Take 500 mg by mouth every 6 (six) hours as needed for mild pain.     Blood Glucose Monitoring Suppl (ONE TOUCH ULTRA SYSTEM KIT) W/DEVICE KIT 1 kit by Does not apply route once. One touch ultrasoft lancets     brimonidine-timolol (COMBIGAN) 0.2-0.5 % ophthalmic solution Place 1 drop into both eyes every 12 (twelve) hours.     carvedilol (COREG) 25 MG tablet Take 1 tablet (25 mg) by mouth 2  times daily with food 180 tablet 3   Continuous Glucose Sensor (FREESTYLE LIBRE 3 SENSOR) MISC Apply to upper back of arm. Change every 14 days. 6 each 3   dapagliflozin propanediol (FARXIGA) 10 MG TABS tablet Take 1 tablet (10 mg total)  by mouth daily. 90 tablet 3   Finerenone (KERENDIA) 20 MG TABS Take 1 tablet (20 mg total) by mouth every morning for kidney and heart health 90 tablet 3   HUMALOG KWIKPEN 200 UNIT/ML KwikPen Inject 10-22 Units into the skin 3 (three) times daily. PLUS 4 extra units if blood sugar over 200 mg/dL     hydrALAZINE (APRESOLINE) 100 MG tablet Take 1/2 tablet (50 mg total) by mouth 2 (two) times daily. 90 tablet 3   insulin glargine (LANTUS) 100 UNIT/ML injection Inject 34 Units into the skin daily.     Insulin Pen Needle (TECHLITE PEN NEEDLES) 32G X 4 MM MISC Use to inject insulin 4 times daily 400 each 4   levothyroxine  (SYNTHROID) 125 MCG tablet Take 1 tablet (125 mcg total) by mouth every morning on an empty stomach 30 tablet 11   potassium chloride SA (KLOR-CON M) 20 MEQ tablet Take 2 tablets (40 mEq) by mouth daily. 90 tablet 3   sacubitril-valsartan (ENTRESTO) 49-51 MG Take 1 tablet by mouth 2 (two) times daily. 60 tablet 8   simvastatin (ZOCOR) 20 MG tablet Take 1 tablet (20 mg total) by mouth every evening. 90 tablet 3   torsemide (DEMADEX) 20 MG tablet Take 60 mg by mouth daily.     No current facility-administered medications for this encounter.   Allergies  Allergen Reactions   Ace Inhibitors Cough   Maxidex [Dexamethasone] Other (See Comments)    Sexual dysfunction   Verapamil Other (See Comments)    constipation   Social History   Socioeconomic History   Marital status: Married    Spouse name: Not on file   Number of children: 3   Years of education: Not on file   Highest education level: Not on file  Occupational History   Occupation: truck driver    Comment: Retired Futures trader  Tobacco Use   Smoking status: Never   Smokeless tobacco: Never  Vaping Use   Vaping status: Never Used  Substance and Sexual Activity   Alcohol use: Yes    Comment: one drink every few months - 1965   Drug use: No   Sexual activity: Not on file  Other Topics Concern   Not on file  Social History Narrative   Right handed   Lives in a two story home    Drinks no caffeine    Social Determinants of Health   Financial Resource Strain: Low Risk  (01/08/2023)   Overall Financial Resource Strain (CARDIA)    Difficulty of Paying Living Expenses: Not hard at all  Food Insecurity: No Food Insecurity (01/08/2023)   Hunger Vital Sign    Worried About Running Out of Food in the Last Year: Never true    Ran Out of Food in the Last Year: Never true  Transportation Needs: No Transportation Needs (01/08/2023)   PRAPARE - Administrator, Civil Service (Medical): No    Lack of Transportation  (Non-Medical): No  Physical Activity: Not on file  Stress: Not on file  Social Connections: Unknown (04/03/2022)   Received from [facility]   Social Network    Social Network: Not on file  Intimate Partner Violence: Unknown (02/23/2022)   Received from [facility]   HITS    Physically Hurt: Not on file    Insult or Talk Down To: Not on file    Threaten Physical Harm: Not on file    Scream or Curse: Not on file   Family  History  Problem Relation Age of Onset   Hypertension Father        deceased age 61   Heart attack Father    Heart failure Mother    COPD Sister    CVA Sister    Diabetes Daughter    Colon cancer Neg Hx    Stomach cancer Neg Hx    BP (!) 140/78   Pulse (!) 56   Wt (!) 141 kg (310 lb 12.8 oz)   SpO2 98%   BMI 39.90 kg/m   Wt Readings from Last 3 Encounters:  07/11/23 (!) 141 kg (310 lb 12.8 oz)  04/17/23 133.3 kg (293 lb 14 oz)  04/10/23 (!) 140.6 kg (310 lb)   PHYSICAL EXAM: General:  Well appearing. No resp difficulty HEENT: normal Neck: supple. no JVD. Carotids 2+ bilat; no bruits. No lymphadenopathy or thryomegaly appreciated. Cor: PMI nondisplaced. Regular rate & rhythm. No rubs, gallops or murmurs. Lungs: clear Abdomen: obese soft, nontender, nondistended. No hepatosplenomegaly. No bruits or masses. Good bowel sounds. Extremities: no cyanosis, clubbing, rash, edema Neuro: alert & orientedx3, cranial nerves grossly intact. moves all 4 extremities w/o difficulty. Affect pleasant  ReDs: 42%   ASSESSMENT & PLAN: 1. Chronic Diastolic Heart Failure, - Echo 03/2020 EF preserved EF LVH Grade II DD - Echo (2/24): EF 55-60% RV ok. Grade II DD.  - Stable NYHA III, functional class confounded by body habitus.  - Volume ok. Torsemide at 60 mg daily, increase KCL to 40 daily. - Continue finerenone 20 mg qhs. - Continue Entresto 49/51 mg bid. - Continue Coreg 25 mg bid. - Continue Farxiga 10 mg daily.  - Continue hydralazine 50 mg bid. - Labs  today - Needs GLP1RA -> will refer to PharmD - wear compression hose.  2. HTN  - BP high here but well controlled at home - Follows with Dr. Duke Salvia in HTN program  3. DMII - Hgb A1C 8.6  - On SGLT2i - Blood glucose fluctuating from 50s-300s - Followed by Endocrinology  - -Needs GLP1RA  for HF-> will refer to PharmD  4. OSA - Continue CPAP nightly.   5. Obesity  - Body mass index is 39.9 kg/m.  --Needs GLP1RA from HF perspective -> will refer to PharmD  6. CKD Stage IV - Baseline SCr 2.4-2.8 - Followed by Dr Marisue Humble.  - Labs today  Arvilla Meres, MD  11:36 AM

## 2023-07-11 NOTE — Addendum Note (Signed)
Encounter addended by: Linda Hedges, RN on: 07/11/2023 11:44 AM  Actions taken: Order list changed, Diagnosis association updated, Clinical Note Signed, Charge Capture section accepted

## 2023-07-16 DIAGNOSIS — R0683 Snoring: Secondary | ICD-10-CM | POA: Diagnosis not present

## 2023-07-16 DIAGNOSIS — I10 Essential (primary) hypertension: Secondary | ICD-10-CM | POA: Diagnosis not present

## 2023-07-16 DIAGNOSIS — G4733 Obstructive sleep apnea (adult) (pediatric): Secondary | ICD-10-CM | POA: Diagnosis not present

## 2023-07-23 ENCOUNTER — Other Ambulatory Visit (HOSPITAL_COMMUNITY): Payer: Self-pay

## 2023-07-26 ENCOUNTER — Other Ambulatory Visit (HOSPITAL_COMMUNITY): Payer: Self-pay

## 2023-07-29 ENCOUNTER — Other Ambulatory Visit (HOSPITAL_COMMUNITY): Payer: Self-pay

## 2023-07-29 ENCOUNTER — Ambulatory Visit
Payer: Commercial Managed Care - PPO | Attending: Cardiovascular Disease | Admitting: Pharmacist Clinician (PhC)/ Clinical Pharmacy Specialist

## 2023-07-29 ENCOUNTER — Telehealth: Payer: Self-pay | Admitting: Pharmacy Technician

## 2023-07-29 ENCOUNTER — Telehealth: Payer: Self-pay | Admitting: Pharmacist Clinician (PhC)/ Clinical Pharmacy Specialist

## 2023-07-29 ENCOUNTER — Encounter: Payer: Self-pay | Admitting: Pharmacist Clinician (PhC)/ Clinical Pharmacy Specialist

## 2023-07-29 VITALS — Ht 74.0 in | Wt 315.2 lb

## 2023-07-29 DIAGNOSIS — E119 Type 2 diabetes mellitus without complications: Secondary | ICD-10-CM | POA: Diagnosis not present

## 2023-07-29 DIAGNOSIS — Z794 Long term (current) use of insulin: Secondary | ICD-10-CM | POA: Diagnosis not present

## 2023-07-29 MED ORDER — MOUNJARO 2.5 MG/0.5ML ~~LOC~~ SOAJ
2.5000 mg | SUBCUTANEOUS | 1 refills | Status: DC
  Filled 2023-07-29: qty 2, 28d supply, fill #0
  Filled 2023-09-04 – 2023-09-05 (×2): qty 2, 28d supply, fill #1

## 2023-07-29 MED ORDER — MOUNJARO 5 MG/0.5ML ~~LOC~~ SOAJ
5.0000 mg | SUBCUTANEOUS | 1 refills | Status: DC
  Filled 2023-07-29 – 2023-09-05 (×2): qty 2, 28d supply, fill #0

## 2023-07-29 MED ORDER — MOUNJARO 7.5 MG/0.5ML ~~LOC~~ SOAJ
7.5000 mg | SUBCUTANEOUS | 1 refills | Status: DC
  Filled 2023-07-29 – 2023-10-30 (×2): qty 2, 28d supply, fill #0
  Filled 2023-11-25: qty 2, 28d supply, fill #1

## 2023-07-29 NOTE — Progress Notes (Unsigned)
Office Visit    Patient Name: Brendan [name]. Date of Encounter: 07/29/2023  Primary Care Provider:  Renford Dills, MD Primary Cardiologist:  None  Chief Complaint    Weight management  Significant Past Medical History   CHF EF on carvedilol, dapagliflozin, Entresto, torsemide  HTN Treated with HF medications  hypothyroid On levothyroxine 125  DM2 2/24 A1c 8.6, on dapagliflozin 10, lantus, humalog  CKD 8/24 SCr 2.68, GFR 25 on Kerendia    Allergies  Allergen Reactions   Ace Inhibitors Cough   Maxidex [Dexamethasone] Other (See Comments)    Sexual dysfunction   Verapamil Other (See Comments)    constipation    History of Present Illness    Brendan Camejo. is a 67 y.o. male patient of Dr Gala Romney, in the office today for weight management.   He is on both Humalog and Lantus insulins, and notes that he does dose adjustments for high and low readings as appropriate.  States will occasionally get episode of hypoglycemia, lowest he has seen in several weeks was 68.    Current meds that may affect weight:  none  Baseline weight/BMI: 143 kg // 40.45  Insurance payor: Monia Pouch -   Diet: vegetables frozen; all proteins, no fried foods; avoids snacking; no breakfast  Exercise: walking some, limited by weak legs, SOB  Family History: mother died from heart disease 47, father died from MI at 42; daughter with DM  Confirmed patient no personal or family history of medullary thyroid carcinoma (MTC) or Multiple Endocrine Neoplasia syndrome type 2 (MEN 2).   Social History:   Tobacco: no  Alcohol:  occasional ber   Accessory Clinical Findings    Lab Results  Component Value Date   CREATININE 2.68 (H) 07/11/2023   BUN 32 (H) 07/11/2023   NA 137 07/11/2023   K 3.6 07/11/2023   CL 102 07/11/2023   CO2 25 07/11/2023   Lab Results  Component Value Date   ALT 13 04/21/2022   AST 14 (L) 04/21/2022   ALKPHOS 74 04/21/2022   BILITOT 0.8 04/21/2022   Lab  Results  Component Value Date   HGBA1C 8.6 (H) 01/08/2023      Home Medications/Allergies    Current Outpatient Medications  Medication Sig Dispense Refill   acetaminophen (TYLENOL) 500 MG tablet Take 500 mg by mouth every 6 (six) hours as needed for mild pain.     Blood Glucose Monitoring Suppl (ONE TOUCH ULTRA SYSTEM KIT) W/DEVICE KIT 1 kit by Does not apply route once. One touch ultrasoft lancets     brimonidine-timolol (COMBIGAN) 0.2-0.5 % ophthalmic solution Place 1 drop into both eyes every 12 (twelve) hours.     carvedilol (COREG) 25 MG tablet Take 1 tablet (25 mg) by mouth 2  times daily with food 180 tablet 3   Continuous Glucose Sensor (FREESTYLE LIBRE 3 SENSOR) MISC Apply to upper back of arm. Change every 14 days. 6 each 3   dapagliflozin propanediol (FARXIGA) 10 MG TABS tablet Take 1 tablet (10 mg total) by mouth daily. 90 tablet 3   Finerenone (KERENDIA) 20 MG TABS Take 1 tablet (20 mg total) by mouth every morning for kidney and heart health 90 tablet 3   HUMALOG KWIKPEN 200 UNIT/ML KwikPen Inject 10-22 Units into the skin 3 (three) times daily. PLUS 4 extra units if blood sugar over 200 mg/dL     hydrALAZINE (APRESOLINE) 100 MG tablet Take 1/2 tablet (50 mg total) by mouth 2 (  two) times daily. 90 tablet 3   insulin glargine (LANTUS) 100 UNIT/ML injection Inject 34 Units into the skin daily.     Insulin Pen Needle (TECHLITE PEN NEEDLES) 32G X 4 MM MISC Use to inject insulin 4 times daily 400 each 4   levothyroxine (SYNTHROID) 125 MCG tablet Take 1 tablet (125 mcg total) by mouth every morning on an empty stomach 30 tablet 11   potassium chloride SA (KLOR-CON M) 20 MEQ tablet Take 2 tablets (40 mEq) by mouth daily. 90 tablet 3   sacubitril-valsartan (ENTRESTO) 49-51 MG Take 1 tablet by mouth 2 (two) times daily. 60 tablet 8   simvastatin (ZOCOR) 20 MG tablet Take 1 tablet (20 mg total) by mouth every evening. 90 tablet 3   torsemide (DEMADEX) 20 MG tablet Take 60 mg by mouth  daily.     No current facility-administered medications for this visit.     Allergies  Allergen Reactions   Ace Inhibitors Cough   Maxidex [Dexamethasone] Other (See Comments)    Sexual dysfunction   Verapamil Other (See Comments)    constipation    Assessment & Plan    Insulin-requiring or dependent type II diabetes mellitus (HCC)  Diabetic patient has not met goal of at least 5% of body weight loss with comprehensive lifestyle modifications alone in the past 3-6 months. Pharmacotherapy is appropriate to pursue as augmentation. Will start Mounjaro.   Confirmed patient has no personal or family history of medullary thyroid carcinoma (MTC) or Multiple Endocrine Neoplasia syndrome type 2 (MEN 2).   Advised patient on common side effects including nausea, diarrhea, dyspepsia, decreased appetite, and fatigue. Counseled patient on reducing meal size and how to titrate medication to minimize side effects. Patient aware to call if intolerable side effects or if experiencing dehydration, abdominal pain, or dizziness. Patient will adhere to dietary modifications and will target at least 150 minutes of moderate intensity exercise weekly.   Injection technique reviewed at today's visit.    Titration Plan:  Will plan to follow the titration plan as below, pending patient is tolerating each dose before increasing to the next. Can slow titration if needed for tolerability.    -Month 1: Inject 2.5 mg SQ once weekly x 4 weeks -Month 2: Inject 5 mg SQ once weekly x 4 weeks -Month 3: Inject 7.5 mg SQ once weekly x 4 weeks  Follow up in 3 months.   Phillips Hay PharmD CPP [facility]  [address] [place], [place] [address] [phone]

## 2023-07-29 NOTE — Addendum Note (Signed)
Addended by: Rosalee Kaufman on: 07/29/2023 04:03 PM   Modules accepted: Orders

## 2023-07-29 NOTE — Telephone Encounter (Signed)
Please do PA for Mounjaro

## 2023-07-29 NOTE — Assessment & Plan Note (Signed)
  Diabetic patient has not met goal of at least 5% of body weight loss with comprehensive lifestyle modifications alone in the past 3-6 months. Pharmacotherapy is appropriate to pursue as augmentation. Will start Mounjaro.   Confirmed patient has no personal or family history of medullary thyroid carcinoma (MTC) or Multiple Endocrine Neoplasia syndrome type 2 (MEN 2).   Advised patient on common side effects including nausea, diarrhea, dyspepsia, decreased appetite, and fatigue. Counseled patient on reducing meal size and how to titrate medication to minimize side effects. Patient aware to call if intolerable side effects or if experiencing dehydration, abdominal pain, or dizziness. Patient will adhere to dietary modifications and will target at least 150 minutes of moderate intensity exercise weekly.   Injection technique reviewed at today's visit.    Titration Plan:  Will plan to follow the titration plan as below, pending patient is tolerating each dose before increasing to the next. Can slow titration if needed for tolerability.    -Month 1: Inject 2.5 mg SQ once weekly x 4 weeks -Month 2: Inject 5 mg SQ once weekly x 4 weeks -Month 3: Inject 7.5 mg SQ once weekly x 4 weeks  Follow up in 3 months.

## 2023-07-29 NOTE — Patient Instructions (Signed)
We will start the prior authorization process to get [facility] covered by your insurance.  [employer] TIPS FOR SUCCESS Write down the reasons why you want to lose weight and post it in a place where you'll see it often. Start small and work your way up. Keep in mind that it takes time to achieve goals, and small steps add up. Any additional movements help to burn calories. Taking the stairs rather than the elevator and parking at the far end of your parking lot are easy ways to start. Brisk walking for at least 30 minutes 4 or more days of the week is an excellent goal to work toward  [employer] WHAT IT MEANS TO FEEL FULL Did you know that it can take 15 minutes or more for your brain to receive the message that you've eaten? That means that, if you eat less food, but consume it slower, you may still feel satisfied. Eating a lot of fruits and vegetables can also help you feel fuller. Eat off of smaller plates so that moderate portions don't seem too small  TITRATION PLAN Will plan to follow the titration plan as below, pending patient is tolerating each dose before increasing to the next. Can slow titration if needed for tolerability.    -Weeks 1-4: Inject 2.5 mg SQ once weekly  -Weeks 5-8: Inject 5 mg SQ once weekly  -Weeks 9-12 Inject 7.5 mg SQ once weekly   Follow up in 3 months.  If you have any questions or concerns, please reach out to [place].  Belenda Cruise [phone].  Go to [email or web address] and apply for a manufacturer's copay card to help with your costs.  THANK YOU FOR CHOOSING [facility]

## 2023-07-29 NOTE — Telephone Encounter (Signed)
Pharmacy Patient Advocate Encounter  Insurance verification completed.    The patient is insured through [facility]   Ran test claim for mounjaro. Currently a quantity of 2 ML is a 28 day supply and the co-pay is $125.00 .   This test claim was processed through [facility]- copay amounts may vary at other pharmacies due to pharmacy/plan contracts, or as the patient moves through the different stages of their insurance plan.

## 2023-07-31 ENCOUNTER — Other Ambulatory Visit: Payer: Self-pay

## 2023-07-31 ENCOUNTER — Other Ambulatory Visit (HOSPITAL_COMMUNITY): Payer: Self-pay

## 2023-08-02 ENCOUNTER — Other Ambulatory Visit (HOSPITAL_BASED_OUTPATIENT_CLINIC_OR_DEPARTMENT_OTHER): Payer: Self-pay

## 2023-08-02 ENCOUNTER — Other Ambulatory Visit (HOSPITAL_COMMUNITY): Payer: Self-pay

## 2023-08-05 ENCOUNTER — Ambulatory Visit: Payer: Medicare Other | Admitting: *Deleted

## 2023-08-05 ENCOUNTER — Other Ambulatory Visit (HOSPITAL_COMMUNITY): Payer: Self-pay

## 2023-08-05 VITALS — Ht 74.0 in | Wt 315.0 lb

## 2023-08-05 DIAGNOSIS — Z1211 Encounter for screening for malignant neoplasm of colon: Secondary | ICD-10-CM

## 2023-08-05 MED ORDER — NA SULFATE-K SULFATE-MG SULF 17.5-3.13-1.6 GM/177ML PO SOLN
1.0000 | Freq: Once | ORAL | 0 refills | Status: DC
Start: 2023-08-05 — End: 2023-08-05
  Filled 2023-08-05: qty 354, 1d supply, fill #0

## 2023-08-05 MED ORDER — NA SULFATE-K SULFATE-MG SULF 17.5-3.13-1.6 GM/177ML PO SOLN
1.0000 | Freq: Once | ORAL | 0 refills | Status: AC
Start: 2023-08-05 — End: 2023-08-09
  Filled 2023-08-05: qty 354, 1d supply, fill #0

## 2023-08-05 NOTE — Progress Notes (Signed)
Pt's name and DOB verified at the beginning of the pre-visit.  Pt denies any difficulty with ambulating,sitting, laying down or rolling side to side Gave both [facility] main # and MD on call # prior to instructions.  No egg or soy allergy known to patient  No issues known to pt with past sedation with any surgeries or procedures Pt has no issues moving head neck or swallowing No FH of Malignant Hyperthermia Pt is not on diet pills Pt is not on home 02  Pt is not on blood thinners  Pt has frequent issues with constipation RN instructed pt to use Miralax per bottles instructions a week before prep days. Pt states they will Pt is not on dialysis Pt denise any abnormal heart rhythms  Pt denies any upcoming cardiac testing Pt encouraged to use to use Singlecare or Goodrx to reduce cost  Patient's chart reviewed by Cathlyn Parsons CNRA prior to pre-visit and patient appropriate for the [facility].  Pre-visit completed and red dot placed by patient's name on their procedure day (on provider's schedule).  . Visit by phone Pt states weight is 315 lb Instructed pt why it is important to and  to call if they have any changes in health or new medications. Directed them to the # given and on instructions.   Pt states they will.  Instructions reviewed with pt  including his hold times for Mounjaro  ( Hold 7 days)and other diabetic meds and pt states understanding. Instructed to review again prior to procedure. Pt states they will.  Instructions sent by mail with coupon and by my chart

## 2023-08-05 NOTE — Addendum Note (Signed)
Addended by: Danton Sewer on: 08/05/2023 11:55 AM   Modules accepted: Orders

## 2023-08-08 ENCOUNTER — Other Ambulatory Visit (HOSPITAL_COMMUNITY): Payer: Self-pay | Admitting: Family Medicine

## 2023-08-08 ENCOUNTER — Other Ambulatory Visit (HOSPITAL_COMMUNITY): Payer: Self-pay

## 2023-08-08 MED ORDER — HUMALOG KWIKPEN 200 UNIT/ML ~~LOC~~ SOPN
PEN_INJECTOR | SUBCUTANEOUS | 3 refills | Status: DC
  Filled 2023-08-08: qty 12, 28d supply, fill #0
  Filled 2023-10-15: qty 12, 28d supply, fill #1
  Filled 2024-02-15 – 2024-02-18 (×2): qty 12, 28d supply, fill #2
  Filled 2024-04-11: qty 12, 28d supply, fill #3
  Filled 2024-04-17: qty 12, 28d supply, fill #4

## 2023-08-08 MED ORDER — TORSEMIDE 20 MG PO TABS
60.0000 mg | ORAL_TABLET | Freq: Every day | ORAL | 11 refills | Status: DC
  Filled 2023-08-08: qty 75, 25d supply, fill #0
  Filled 2023-08-08: qty 15, 5d supply, fill #0
  Filled 2023-08-08: qty 90, 30d supply, fill #0
  Filled 2023-10-02: qty 90, 30d supply, fill #1
  Filled 2023-12-15: qty 90, 30d supply, fill #2
  Filled 2024-03-02: qty 90, 30d supply, fill #3
  Filled 2024-06-07: qty 90, 30d supply, fill #4

## 2023-08-09 ENCOUNTER — Other Ambulatory Visit (HOSPITAL_COMMUNITY): Payer: Self-pay

## 2023-08-09 ENCOUNTER — Other Ambulatory Visit: Payer: Self-pay

## 2023-08-10 ENCOUNTER — Other Ambulatory Visit (HOSPITAL_COMMUNITY): Payer: Self-pay

## 2023-08-10 ENCOUNTER — Other Ambulatory Visit (HOSPITAL_COMMUNITY): Payer: Self-pay | Admitting: Family Medicine

## 2023-08-12 ENCOUNTER — Other Ambulatory Visit (HOSPITAL_COMMUNITY): Payer: Self-pay

## 2023-08-13 ENCOUNTER — Other Ambulatory Visit (HOSPITAL_COMMUNITY): Payer: Self-pay

## 2023-08-13 MED FILL — Sacubitril-Valsartan Tab 49-51 MG: ORAL | 30 days supply | Qty: 60 | Fill #0 | Status: AC

## 2023-08-16 ENCOUNTER — Other Ambulatory Visit (HOSPITAL_COMMUNITY): Payer: Self-pay

## 2023-08-16 DIAGNOSIS — G4733 Obstructive sleep apnea (adult) (pediatric): Secondary | ICD-10-CM | POA: Diagnosis not present

## 2023-08-16 DIAGNOSIS — R0683 Snoring: Secondary | ICD-10-CM | POA: Diagnosis not present

## 2023-08-16 DIAGNOSIS — I10 Essential (primary) hypertension: Secondary | ICD-10-CM | POA: Diagnosis not present

## 2023-08-18 ENCOUNTER — Encounter: Payer: Self-pay | Admitting: Certified Registered Nurse Anesthetist

## 2023-08-19 ENCOUNTER — Encounter: Payer: Self-pay | Admitting: Internal Medicine

## 2023-08-19 ENCOUNTER — Ambulatory Visit: Payer: Commercial Managed Care - PPO | Admitting: Internal Medicine

## 2023-08-19 VITALS — BP 175/74 | HR 51 | Temp 97.0°F | Resp 15 | Ht 74.0 in | Wt 300.0 lb

## 2023-08-19 DIAGNOSIS — Z1211 Encounter for screening for malignant neoplasm of colon: Secondary | ICD-10-CM

## 2023-08-19 DIAGNOSIS — I10 Essential (primary) hypertension: Secondary | ICD-10-CM | POA: Diagnosis not present

## 2023-08-19 DIAGNOSIS — D123 Benign neoplasm of transverse colon: Secondary | ICD-10-CM | POA: Diagnosis not present

## 2023-08-19 DIAGNOSIS — I509 Heart failure, unspecified: Secondary | ICD-10-CM | POA: Diagnosis not present

## 2023-08-19 DIAGNOSIS — G4733 Obstructive sleep apnea (adult) (pediatric): Secondary | ICD-10-CM | POA: Diagnosis not present

## 2023-08-19 DIAGNOSIS — E039 Hypothyroidism, unspecified: Secondary | ICD-10-CM | POA: Diagnosis not present

## 2023-08-19 DIAGNOSIS — D122 Benign neoplasm of ascending colon: Secondary | ICD-10-CM

## 2023-08-19 DIAGNOSIS — K635 Polyp of colon: Secondary | ICD-10-CM | POA: Diagnosis not present

## 2023-08-19 DIAGNOSIS — N184 Chronic kidney disease, stage 4 (severe): Secondary | ICD-10-CM | POA: Diagnosis not present

## 2023-08-19 MED ORDER — SODIUM CHLORIDE 0.9 % IV SOLN: 500.0000 mL | Freq: Once | INTRAVENOUS | Status: DC

## 2023-08-19 NOTE — Progress Notes (Signed)
[facility] History and Physical   Primary Care Physician:  Renford Dills, MD   Reason for Procedure:    Encounter Diagnosis  Name Primary?   Special screening for malignant neoplasms, colon Yes     Plan:    colonoscopy     HPI: Brendan [name]. is a 67 y.o. male here for screening colonoscopy.  Negative exam 2014   Past Medical History:  Diagnosis Date   Acanthosis nigricans    Atopic dermatitis    CHF (congestive heart failure) (HCC)    CKD (chronic kidney disease) stage 4, GFR 15-29 ml/min (HCC) 01/08/2023   Diabetes (HCC) 11/19/2002   Erectile dysfunction    Fatty liver 11/19/2005   Glaucoma 11/19/2006   Gynecomastia 11/20/2007   Hyperaldosteronism (HCC) 11/19/1998   Hypercholesterolemia    Hyperlipidemia 2010   Hypertension 1999   Hypoglycemic reaction    Hypothyroidism 11/19/2002   Incontinence    Intermittent vertigo 11/19/2010   Left cervical radiculopathy 11/19/2010   Lumbar radiculopathy    Obesity    Peripheral neuropathy    Pollen allergies 11/19/2005   perennial   Prostate cancer (HCC) 11/19/2004   Reflux esophagitis 11/19/1993   Sickle cell trait (HCC) 11/19/2004   Sleep apnea, obstructive 11/19/1998   uses a cpap   Venous insufficiency 11/20/2003   Vitamin D deficiency 11/19/2010    Past Surgical History:  Procedure Laterality Date   COLONOSCOPY  2006   normal   lap band surgery  2009   left inguinal hernia repair  1998   LIPOMA EXCISION Left 04/17/2023   Procedure: MINOR EXCISION LEFT BUTTOCK SEBACEOUS CYST;  Surgeon: Quentin Ore, MD;  Location: [facility];  Service: General;  Laterality: Left;   robotic prostatectomy  2008    Prior to Admission medications   Medication Sig Start Date End Date Taking? Authorizing Provider  acetaminophen (TYLENOL) 500 MG tablet Take 500 mg by mouth every 6 (six) hours as needed for mild pain.   Yes [provider]  Blood Glucose Monitoring Suppl (ONE  TOUCH ULTRA SYSTEM KIT) W/DEVICE KIT 1 kit by Does not apply route once. One touch ultrasoft lancets   Yes [provider]  brimonidine-timolol (COMBIGAN) 0.2-0.5 % ophthalmic solution Place 1 drop into both eyes every 12 (twelve) hours.   Yes [provider]  carvedilol (COREG) 25 MG tablet Take 1 tablet (25 mg) by mouth 2  times daily with food 03/21/23  Yes Polite, Ronald, MD  Continuous Glucose Sensor (FREESTYLE LIBRE 3 SENSOR) MISC Apply to upper back of arm. Change every 14 days. 04/11/23  Yes   dapagliflozin propanediol (FARXIGA) 10 MG TABS tablet Take 1 tablet (10 mg total) by mouth daily. 04/11/23  Yes   Finerenone (KERENDIA) 20 MG TABS Take 1 tablet (20 mg total) by mouth every morning for kidney and heart health 11/08/22  Yes Arita Miss, MD  HUMALOG KWIKPEN 200 UNIT/ML KwikPen Inject 10-22 Units into the skin 3 (three) times daily. PLUS 4 extra units if blood sugar over 200 mg/dL 5/62/13  Yes [provider]  hydrALAZINE (APRESOLINE) 100 MG tablet Take 1/2 tablet (50 mg total) by mouth 2 (two) times daily. 01/16/23  Yes Clegg, Amy D, NP  insulin glargine (LANTUS) 100 UNIT/ML injection Inject 34 Units into the skin daily.   Yes [provider]  insulin lispro (HUMALOG KWIKPEN) 200 UNIT/ML KwikPen Inject under the skin 12 units at small meals and 24 units at regular meals  3  times a day 08/08/23  Yes   Insulin Pen Needle (TECHLITE PEN NEEDLES) 32G X 4 MM MISC Use to inject insulin 4 times daily 10/25/22  Yes Talmage Coin, MD  levothyroxine (SYNTHROID) 125 MCG tablet Take 1 tablet (125 mcg total) by mouth every morning on an empty stomach 05/24/23  Yes   potassium chloride SA (KLOR-CON M) 20 MEQ tablet Take 2 tablets (40 mEq) by mouth daily. 03/25/23  Yes   sacubitril-valsartan (ENTRESTO) 49-51 MG Take 1 tablet by mouth 2 (two) times daily. 08/13/23  Yes Bensimhon, Bevelyn Buckles, MD  simvastatin (ZOCOR) 20 MG tablet Take 1 tablet (20 mg total) by mouth every evening.  04/11/23  Yes   torsemide (DEMADEX) 20 MG tablet Take 3 tablets (60 mg total) by mouth daily. 08/08/23 11/08/23 Yes Milford, Anderson Malta, FNP  tirzepatide [facility]) 2.5 MG/0.5ML Pen Inject 2.5 mg into the skin once a week. 07/29/23   Bensimhon, Bevelyn Buckles, MD  tirzepatide [facility]) 5 MG/0.5ML Pen Inject 5 mg into the skin once a week. Patient not taking: Reported on 08/19/2023 07/29/23   Bensimhon, Bevelyn Buckles, MD  tirzepatide [facility]) 7.5 MG/0.5ML Pen Inject 7.5 mg into the skin once a week. Patient not taking: Reported on 08/19/2023 07/29/23   Bensimhon, Bevelyn Buckles, MD    Current Outpatient Medications  Medication Sig Dispense Refill   acetaminophen (TYLENOL) 500 MG tablet Take 500 mg by mouth every 6 (six) hours as needed for mild pain.     Blood Glucose Monitoring Suppl (ONE TOUCH ULTRA SYSTEM KIT) W/DEVICE KIT 1 kit by Does not apply route once. One touch ultrasoft lancets     brimonidine-timolol (COMBIGAN) 0.2-0.5 % ophthalmic solution Place 1 drop into both eyes every 12 (twelve) hours.     carvedilol (COREG) 25 MG tablet Take 1 tablet (25 mg) by mouth 2  times daily with food 180 tablet 3   Continuous Glucose Sensor (FREESTYLE LIBRE 3 SENSOR) MISC Apply to upper back of arm. Change every 14 days. 6 each 3   dapagliflozin propanediol (FARXIGA) 10 MG TABS tablet Take 1 tablet (10 mg total) by mouth daily. 90 tablet 3   Finerenone (KERENDIA) 20 MG TABS Take 1 tablet (20 mg total) by mouth every morning for kidney and heart health 90 tablet 3   HUMALOG KWIKPEN 200 UNIT/ML KwikPen Inject 10-22 Units into the skin 3 (three) times daily. PLUS 4 extra units if blood sugar over 200 mg/dL     hydrALAZINE (APRESOLINE) 100 MG tablet Take 1/2 tablet (50 mg total) by mouth 2 (two) times daily. 90 tablet 3   insulin glargine (LANTUS) 100 UNIT/ML injection Inject 34 Units into the skin daily.     insulin lispro (HUMALOG KWIKPEN) 200 UNIT/ML KwikPen Inject under the skin 12 units at small meals and 24 units at regular  meals  3 times a day 75 mL 3   Insulin Pen Needle (TECHLITE PEN NEEDLES) 32G X 4 MM MISC Use to inject insulin 4 times daily 400 each 4   levothyroxine (SYNTHROID) 125 MCG tablet Take 1 tablet (125 mcg total) by mouth every morning on an empty stomach 30 tablet 11   potassium chloride SA (KLOR-CON M) 20 MEQ tablet Take 2 tablets (40 mEq) by mouth daily. 90 tablet 3   sacubitril-valsartan (ENTRESTO) 49-51 MG Take 1 tablet by mouth 2 (two) times daily. 60 tablet 8   simvastatin (ZOCOR) 20 MG tablet Take 1 tablet (20 mg total) by mouth every evening. 90 tablet 3  torsemide (DEMADEX) 20 MG tablet Take 3 tablets (60 mg total) by mouth daily. 90 tablet 11   tirzepatide (MOUNJARO) 2.5 MG/0.5ML Pen Inject 2.5 mg into the skin once a week. 2 mL 1   tirzepatide (MOUNJARO) 5 MG/0.5ML Pen Inject 5 mg into the skin once a week. (Patient not taking: Reported on 08/19/2023) 2 mL 1   tirzepatide (MOUNJARO) 7.5 MG/0.5ML Pen Inject 7.5 mg into the skin once a week. (Patient not taking: Reported on 08/19/2023) 2 mL 1   Current Facility-Administered Medications  Medication Dose Route Frequency Provider Last Rate Last Admin   0.9 %  sodium chloride infusion  500 mL Intravenous Once Iva Boop, MD        Allergies as of 08/19/2023 - Review Complete 08/19/2023  Allergen Reaction Noted   Ace inhibitors Cough    Maxidex [dexamethasone] Other (See Comments) 07/10/2013   Verapamil Other (See Comments)     Family History  Problem Relation Age of Onset   Heart failure Mother    Hypertension Father        deceased age 50   Heart attack Father    COPD Sister    CVA Sister    Diabetes Daughter    Colon cancer Neg Hx    Stomach cancer Neg Hx    Colon polyps Neg Hx    Esophageal cancer Neg Hx    Rectal cancer Neg Hx     Social History   Socioeconomic History   Marital status: Married    Spouse name: Not on file   Number of children: 3   Years of education: Not on file   Highest education level: Not  on file  Occupational History   Occupation: truck driver    Comment: Retired Futures trader  Tobacco Use   Smoking status: Never   Smokeless tobacco: Never  Vaping Use   Vaping status: Never Used  Substance and Sexual Activity   Alcohol use: Yes    Comment: one drink every few months - 1966   Drug use: No   Sexual activity: Yes  Other Topics Concern   Not on file  Social History Narrative   Right handed   Lives in a two story home    Drinks no caffeine    Social Determinants of Health   Financial Resource Strain: Low Risk  (01/08/2023)   Overall Financial Resource Strain (CARDIA)    Difficulty of Paying Living Expenses: Not hard at all  Food Insecurity: No Food Insecurity (01/08/2023)   Hunger Vital Sign    Worried About Running Out of Food in the Last Year: Never true    Ran Out of Food in the Last Year: Never true  Transportation Needs: No Transportation Needs (01/08/2023)   PRAPARE - Administrator, Civil Service (Medical): No    Lack of Transportation (Non-Medical): No  Physical Activity: Not on file  Stress: Not on file  Social Connections: Unknown (04/03/2022)   [facility] from [facility]   Social Network    Social Network: Not on file  Intimate Partner Violence: Unknown (02/23/2022)   [facility], [facility]   HITS    Physically Hurt: Not on file    Insult or Talk Down To: Not on file    Threaten Physical Harm: Not on file    Scream or Curse: Not on file    Review of Systems:  All other review of systems negative except as mentioned in the  HPI.  Physical Exam: Vital signs BP (!) 197/90 (BP Location: Left Arm, Patient Position: Supine, Cuff Size: Large)   Temp (!) 97 F (36.1 C) (Temporal)   Ht 6\' 2"  (1.88 m)   Wt 300 lb (136.1 kg)   SpO2 100%   BMI 38.52 kg/m   General:   Alert,  Well-developed, well-nourished, pleasant and cooperative in NAD Lungs:  Clear throughout to auscultation.   Heart:  Regular  rate and rhythm; no murmurs, clicks, rubs,  or gallops. Abdomen:  Soft, nontender and nondistended. Normal bowel sounds.   Neuro/Psych:  Alert and cooperative. Normal mood and affect. A and O x 3   @Patsie Mccardle  Sena Slate, MD, [facility] [phone] (pager) 08/19/2023 3:11 PM@

## 2023-08-19 NOTE — Patient Instructions (Addendum)
Please read handouts provided. Continue present medications. Await pathology results.   YOU HAD AN ENDOSCOPIC PROCEDURE TODAY AT THE [facility]:   Refer to the procedure report that was given to you for any specific questions about what was found during the examination.  If the procedure report does not answer your questions, please call your gastroenterologist to clarify.  If you requested that your care partner not be given the details of your procedure findings, then the procedure report has been included in a sealed envelope for you to review at your convenience later.  YOU SHOULD EXPECT: Some feelings of bloating in the abdomen. Passage of more gas than usual.  Walking can help get rid of the air that was put into your GI tract during the procedure and reduce the bloating. If you had a lower endoscopy (such as a colonoscopy or flexible sigmoidoscopy) you may notice spotting of blood in your stool or on the toilet paper. If you underwent a bowel prep for your procedure, you may not have a normal bowel movement for a few days.  Please Note:  You might notice some irritation and congestion in your nose or some drainage.  This is from the oxygen used during your procedure.  There is no need for concern and it should clear up in a day or so.  SYMPTOMS TO REPORT IMMEDIATELY:  Following lower endoscopy (colonoscopy or flexible sigmoidoscopy):  Excessive amounts of blood in the stool  Significant tenderness or worsening of abdominal pains  Swelling of the abdomen that is new, acute  Fever of 100F or higher.  For urgent or emergent issues, a gastroenterologist can be reached at any hour by calling [phone]. Do not use MyChart messaging for urgent concerns.    DIET:  We do recommend a small meal at first, but then you may proceed to your regular diet.  Drink plenty of fluids but you should avoid alcoholic beverages for 24 hours.  ACTIVITY:  You should plan to take it easy for  the rest of today and you should NOT DRIVE or use heavy machinery until tomorrow (because of the sedation medicines used during the test).    FOLLOW UP: Our staff will call the number listed on your records the next business day following your procedure.  We will call around 7:15- 8:00 am to check on you and address any questions or concerns that you may have regarding the information given to you following your procedure. If we do not reach you, we will leave a message.     If any biopsies were taken you will be contacted by phone or by letter within the next 1-3 weeks.  Please call us at [phone] if you have not heard about the biopsies in 3 weeks.    SIGNATURES/CONFIDENTIALITY: You and/or your care partner have signed paperwork which will be entered into your electronic medical record.  These signatures attest to the fact that that the information above on your After Visit Summary has been reviewed and is understood.  Full responsibility of the confidentiality of this discharge information lies with you and/or your care-partner.I found and removed 5 small polyps today.  No signs of cancer.  I will let you know pathology results and when to have another routine colonoscopy by mail and/or My Chart.  I appreciate the opportunity to care for you. Iva Boop, MD, [facility]

## 2023-08-19 NOTE — Progress Notes (Signed)
Report given to PACU, vss 

## 2023-08-19 NOTE — Progress Notes (Signed)
1533 Nasopharyngeal airway size 8.0 placed without trauma, vss

## 2023-08-19 NOTE — Progress Notes (Signed)
1515 BP 201/104, Labetalol given IV, MD update, vss

## 2023-08-19 NOTE — Progress Notes (Signed)
Called to room to assist during endoscopic procedure.  Patient ID and intended procedure confirmed with present staff. Received instructions for my participation in the procedure from the performing physician.  

## 2023-08-19 NOTE — Progress Notes (Signed)
1523 BP 198/95, Labetalol given IV, MD update, vss

## 2023-08-19 NOTE — Op Note (Signed)
[facility] Patient Name: Brendan [name] Procedure Date: 08/19/2023 3:11 PM MRN: [id number] Endoscopist: Iva Boop , MD, [id number] Age: 67 Referring MD:  Date of Birth: 08-Jun-1956 Gender: Male Account #: [id number] Procedure:                Colonoscopy Indications:              Screening for colorectal malignant neoplasm, Last                            colonoscopy: 2014 Medicines:                Monitored Anesthesia Care Procedure:                Pre-Anesthesia Assessment:                           - Prior to the procedure, a History and Physical                            was performed, and patient medications and                            allergies were reviewed. The patient's tolerance of                            previous anesthesia was also reviewed. The risks                            and benefits of the procedure and the sedation                            options and risks were discussed with the patient.                            All questions were answered, and informed consent                            was obtained. Prior Anticoagulants: The patient has                            taken no anticoagulant or antiplatelet agents. ASA                            Grade Assessment: III - A patient with severe                            systemic disease. After reviewing the risks and                            benefits, the patient was deemed in satisfactory                            condition to undergo the procedure.  After obtaining informed consent, the colonoscope                            was passed under direct vision. Throughout the                            procedure, the patient's blood pressure, pulse, and                            oxygen saturations were monitored continuously. The                            CF [id number] #[id number] was introduced through the anus                            and advanced to the the cecum,  identified by                            appendiceal orifice and ileocecal valve. The                            colonoscopy was performed without difficulty. The                            patient tolerated the procedure well. The quality                            of the bowel preparation was good. The ileocecal                            valve, appendiceal orifice, and rectum were                            photographed. Scope In: 3:21:43 PM Scope Out: 3:38:17 PM Scope Withdrawal Time: 0 hours 9 minutes 53 seconds  Total Procedure Duration: 0 hours 16 minutes 34 seconds  Findings:                 The perianal and digital rectal examinations were                            normal.                           Five sessile polyps were found in the transverse                            colon and ascending colon. The polyps were                            diminutive in size. These polyps were removed with                            a cold snare. Resection and retrieval were  complete. Verification of patient identification                            for the specimen was done. Estimated blood loss was                            minimal.                           The exam was otherwise without abnormality on                            direct and retroflexion views. Complications:            No immediate complications. Estimated Blood Loss:     Estimated blood loss was minimal. Impression:               - Five diminutive polyps in the transverse colon                            and in the ascending colon, removed with a cold                            snare. Resected and retrieved.                           - The examination was otherwise normal on direct                            and retroflexion views. Recommendation:           - Patient has a contact number available for                            emergencies. The signs and symptoms of potential                             delayed complications were discussed with the                            patient. Return to normal activities tomorrow.                            Written discharge instructions were provided to the                            patient.                           - Resume previous diet.                           - Continue present medications.                           - Repeat colonoscopy is recommended. The  colonoscopy date will be determined after pathology                            results from today's exam become available for                            review. Iva Boop, MD 08/19/2023 3:43:48 PM This report has been signed electronically.

## 2023-08-19 NOTE — Progress Notes (Signed)
Pt's states no medical or surgical changes since previsit or office visit. 

## 2023-08-20 ENCOUNTER — Telehealth: Payer: Self-pay | Admitting: *Deleted

## 2023-08-20 NOTE — Telephone Encounter (Signed)
  Follow up Call-     08/19/2023    2:38 PM  Call back number  Post procedure Call Back phone  # [phone]  Permission to leave phone message Yes     Patient questions:  Do you have a fever, pain , or abdominal swelling? No. Pain Score  0 *  Have you tolerated food without any problems? Yes.    Have you been able to return to your normal activities? Yes.    Do you have any questions about your discharge instructions: Diet   No. Medications  No. Follow up visit  No.  Do you have questions or concerns about your Care? No.  Actions: * If pain score is 4 or above: No action needed, pain <4.

## 2023-08-22 LAB — SURGICAL PATHOLOGY

## 2023-08-26 ENCOUNTER — Encounter: Payer: Self-pay | Admitting: Internal Medicine

## 2023-08-26 ENCOUNTER — Other Ambulatory Visit (HOSPITAL_COMMUNITY): Payer: Self-pay

## 2023-08-26 DIAGNOSIS — E1142 Type 2 diabetes mellitus with diabetic polyneuropathy: Secondary | ICD-10-CM | POA: Diagnosis not present

## 2023-08-26 DIAGNOSIS — Z860101 Personal history of adenomatous and serrated colon polyps: Secondary | ICD-10-CM | POA: Insufficient documentation

## 2023-08-26 DIAGNOSIS — Z794 Long term (current) use of insulin: Secondary | ICD-10-CM | POA: Diagnosis not present

## 2023-08-26 DIAGNOSIS — E039 Hypothyroidism, unspecified: Secondary | ICD-10-CM | POA: Diagnosis not present

## 2023-08-26 DIAGNOSIS — E1122 Type 2 diabetes mellitus with diabetic chronic kidney disease: Secondary | ICD-10-CM | POA: Diagnosis not present

## 2023-08-26 DIAGNOSIS — N1832 Chronic kidney disease, stage 3b: Secondary | ICD-10-CM | POA: Diagnosis not present

## 2023-08-26 DIAGNOSIS — E1165 Type 2 diabetes mellitus with hyperglycemia: Secondary | ICD-10-CM | POA: Diagnosis not present

## 2023-08-26 HISTORY — DX: Personal history of adenomatous and serrated colon polyps: Z86.0101

## 2023-08-27 ENCOUNTER — Other Ambulatory Visit (HOSPITAL_COMMUNITY): Payer: Self-pay

## 2023-08-28 ENCOUNTER — Other Ambulatory Visit (HOSPITAL_COMMUNITY): Payer: Self-pay

## 2023-08-30 ENCOUNTER — Other Ambulatory Visit (HOSPITAL_COMMUNITY): Payer: Self-pay | Admitting: Adult Health

## 2023-08-30 ENCOUNTER — Other Ambulatory Visit (HOSPITAL_COMMUNITY): Payer: Self-pay

## 2023-08-30 ENCOUNTER — Encounter (HOSPITAL_COMMUNITY): Payer: Self-pay

## 2023-08-30 DIAGNOSIS — I5032 Chronic diastolic (congestive) heart failure: Secondary | ICD-10-CM

## 2023-08-30 MED ORDER — HYDRALAZINE HCL 100 MG PO TABS
50.0000 mg | ORAL_TABLET | Freq: Two times a day (BID) | ORAL | 3 refills | Status: DC
  Filled 2023-08-30 – 2023-09-02 (×2): qty 90, 90d supply, fill #0
  Filled 2023-11-25: qty 90, 90d supply, fill #1
  Filled 2024-02-27: qty 90, 90d supply, fill #2
  Filled 2024-05-27: qty 90, 90d supply, fill #3

## 2023-09-02 ENCOUNTER — Other Ambulatory Visit (HOSPITAL_COMMUNITY): Payer: Self-pay

## 2023-09-02 DIAGNOSIS — R809 Proteinuria, unspecified: Secondary | ICD-10-CM | POA: Diagnosis not present

## 2023-09-02 DIAGNOSIS — N184 Chronic kidney disease, stage 4 (severe): Secondary | ICD-10-CM | POA: Diagnosis not present

## 2023-09-02 DIAGNOSIS — I129 Hypertensive chronic kidney disease with stage 1 through stage 4 chronic kidney disease, or unspecified chronic kidney disease: Secondary | ICD-10-CM | POA: Diagnosis not present

## 2023-09-02 DIAGNOSIS — I5032 Chronic diastolic (congestive) heart failure: Secondary | ICD-10-CM | POA: Diagnosis not present

## 2023-09-02 DIAGNOSIS — E1122 Type 2 diabetes mellitus with diabetic chronic kidney disease: Secondary | ICD-10-CM | POA: Diagnosis not present

## 2023-09-02 DIAGNOSIS — N2581 Secondary hyperparathyroidism of renal origin: Secondary | ICD-10-CM | POA: Diagnosis not present

## 2023-09-03 LAB — LAB REPORT - SCANNED
Albumin, Urine POC: 538.7
Creatinine, POC: 29.5 mg/dL
EGFR: 24
Microalb Creat Ratio: 1826

## 2023-09-05 ENCOUNTER — Other Ambulatory Visit (HOSPITAL_COMMUNITY): Payer: Self-pay

## 2023-09-05 ENCOUNTER — Other Ambulatory Visit: Payer: Self-pay

## 2023-09-05 MED ORDER — MOUNJARO 5 MG/0.5ML ~~LOC~~ SOAJ
5.0000 mg | SUBCUTANEOUS | 0 refills | Status: DC
  Filled 2023-09-05 – 2023-10-02 (×2): qty 2, 28d supply, fill #0

## 2023-09-06 ENCOUNTER — Other Ambulatory Visit (HOSPITAL_COMMUNITY): Payer: Self-pay

## 2023-09-13 ENCOUNTER — Other Ambulatory Visit: Payer: Self-pay

## 2023-09-13 ENCOUNTER — Other Ambulatory Visit (HOSPITAL_COMMUNITY): Payer: Self-pay

## 2023-09-13 MED FILL — Sacubitril-Valsartan Tab 49-51 MG: ORAL | 30 days supply | Qty: 60 | Fill #1 | Status: AC

## 2023-09-16 ENCOUNTER — Other Ambulatory Visit: Payer: Self-pay

## 2023-09-20 ENCOUNTER — Other Ambulatory Visit: Payer: Self-pay

## 2023-09-21 ENCOUNTER — Other Ambulatory Visit (HOSPITAL_COMMUNITY): Payer: Self-pay

## 2023-10-02 ENCOUNTER — Other Ambulatory Visit (HOSPITAL_COMMUNITY): Payer: Self-pay

## 2023-10-03 ENCOUNTER — Other Ambulatory Visit (HOSPITAL_COMMUNITY): Payer: Self-pay

## 2023-10-09 ENCOUNTER — Other Ambulatory Visit (HOSPITAL_COMMUNITY): Payer: Self-pay

## 2023-10-15 ENCOUNTER — Other Ambulatory Visit (HOSPITAL_COMMUNITY): Payer: Self-pay

## 2023-10-16 ENCOUNTER — Other Ambulatory Visit (HOSPITAL_COMMUNITY): Payer: Self-pay

## 2023-10-16 DIAGNOSIS — G4733 Obstructive sleep apnea (adult) (pediatric): Secondary | ICD-10-CM | POA: Diagnosis not present

## 2023-10-16 DIAGNOSIS — R0683 Snoring: Secondary | ICD-10-CM | POA: Diagnosis not present

## 2023-10-16 DIAGNOSIS — I10 Essential (primary) hypertension: Secondary | ICD-10-CM | POA: Diagnosis not present

## 2023-10-20 MED FILL — Sacubitril-Valsartan Tab 49-51 MG: ORAL | 30 days supply | Qty: 60 | Fill #2 | Status: AC

## 2023-10-22 ENCOUNTER — Other Ambulatory Visit (HOSPITAL_COMMUNITY): Payer: Self-pay

## 2023-10-30 ENCOUNTER — Other Ambulatory Visit (HOSPITAL_COMMUNITY): Payer: Self-pay

## 2023-11-01 ENCOUNTER — Other Ambulatory Visit (HOSPITAL_COMMUNITY): Payer: Self-pay

## 2023-11-02 ENCOUNTER — Other Ambulatory Visit (HOSPITAL_COMMUNITY): Payer: Self-pay

## 2023-11-11 ENCOUNTER — Other Ambulatory Visit (HOSPITAL_COMMUNITY): Payer: Self-pay

## 2023-11-11 DIAGNOSIS — H401131 Primary open-angle glaucoma, bilateral, mild stage: Secondary | ICD-10-CM | POA: Diagnosis not present

## 2023-11-11 MED ORDER — BRIMONIDINE TARTRATE-TIMOLOL 0.2-0.5 % OP SOLN
1.0000 [drp] | Freq: Two times a day (BID) | OPHTHALMIC | 4 refills | Status: DC
  Filled 2023-11-11 – 2023-11-25 (×2): qty 5, 50d supply, fill #0
  Filled 2023-11-25: qty 15, 75d supply, fill #0
  Filled 2023-12-03: qty 5, 25d supply, fill #0
  Filled 2023-12-05 – 2024-01-01 (×3): qty 15, 75d supply, fill #0

## 2023-11-12 ENCOUNTER — Other Ambulatory Visit (HOSPITAL_COMMUNITY): Payer: Self-pay

## 2023-11-12 MED FILL — Sacubitril-Valsartan Tab 49-51 MG: ORAL | 30 days supply | Qty: 60 | Fill #3 | Status: AC

## 2023-11-14 ENCOUNTER — Other Ambulatory Visit (HOSPITAL_COMMUNITY): Payer: Self-pay

## 2023-11-25 ENCOUNTER — Other Ambulatory Visit: Payer: Self-pay

## 2023-11-25 ENCOUNTER — Other Ambulatory Visit (HOSPITAL_BASED_OUTPATIENT_CLINIC_OR_DEPARTMENT_OTHER): Payer: Self-pay

## 2023-11-25 ENCOUNTER — Other Ambulatory Visit (HOSPITAL_COMMUNITY): Payer: Self-pay

## 2023-11-26 ENCOUNTER — Other Ambulatory Visit (HOSPITAL_COMMUNITY): Payer: Self-pay

## 2023-11-27 ENCOUNTER — Other Ambulatory Visit (HOSPITAL_COMMUNITY): Payer: Self-pay

## 2023-11-28 ENCOUNTER — Other Ambulatory Visit (HOSPITAL_BASED_OUTPATIENT_CLINIC_OR_DEPARTMENT_OTHER): Payer: Self-pay

## 2023-11-28 ENCOUNTER — Encounter (HOSPITAL_COMMUNITY): Payer: Self-pay

## 2023-11-28 ENCOUNTER — Other Ambulatory Visit (HOSPITAL_COMMUNITY): Payer: Self-pay

## 2023-11-28 DIAGNOSIS — N1832 Chronic kidney disease, stage 3b: Secondary | ICD-10-CM | POA: Diagnosis not present

## 2023-11-28 DIAGNOSIS — E039 Hypothyroidism, unspecified: Secondary | ICD-10-CM | POA: Diagnosis not present

## 2023-11-28 DIAGNOSIS — Z794 Long term (current) use of insulin: Secondary | ICD-10-CM | POA: Diagnosis not present

## 2023-11-28 DIAGNOSIS — K5900 Constipation, unspecified: Secondary | ICD-10-CM | POA: Diagnosis not present

## 2023-11-28 DIAGNOSIS — E1122 Type 2 diabetes mellitus with diabetic chronic kidney disease: Secondary | ICD-10-CM | POA: Diagnosis not present

## 2023-11-28 MED ORDER — MOUNJARO 10 MG/0.5ML ~~LOC~~ SOAJ
10.0000 mg | SUBCUTANEOUS | 12 refills | Status: DC
  Filled 2023-11-28: qty 2, 28d supply, fill #0
  Filled 2023-12-21: qty 2, 28d supply, fill #1
  Filled 2024-01-14: qty 2, 28d supply, fill #2
  Filled 2024-02-15 – 2024-02-18 (×2): qty 2, 28d supply, fill #3

## 2023-11-29 ENCOUNTER — Other Ambulatory Visit (HOSPITAL_COMMUNITY): Payer: Self-pay

## 2023-11-30 ENCOUNTER — Other Ambulatory Visit (HOSPITAL_BASED_OUTPATIENT_CLINIC_OR_DEPARTMENT_OTHER): Payer: Self-pay

## 2023-12-02 ENCOUNTER — Other Ambulatory Visit (HOSPITAL_COMMUNITY): Payer: Self-pay

## 2023-12-03 ENCOUNTER — Other Ambulatory Visit (HOSPITAL_COMMUNITY): Payer: Self-pay

## 2023-12-04 ENCOUNTER — Other Ambulatory Visit (HOSPITAL_COMMUNITY): Payer: Self-pay

## 2023-12-04 MED ORDER — TIMOLOL MALEATE 0.5 % OP SOLN
1.0000 [drp] | Freq: Two times a day (BID) | OPHTHALMIC | 5 refills | Status: DC
  Filled 2023-12-04: qty 5, 50d supply, fill #0
  Filled 2023-12-05: qty 5, 25d supply, fill #0
  Filled 2024-01-03: qty 5, 25d supply, fill #1
  Filled 2024-04-16: qty 5, 25d supply, fill #2
  Filled 2024-07-09: qty 5, 25d supply, fill #3

## 2023-12-04 MED ORDER — BRIMONIDINE TARTRATE 0.15 % OP SOLN
1.0000 [drp] | Freq: Two times a day (BID) | OPHTHALMIC | 5 refills | Status: DC
  Filled 2023-12-04: qty 5, 50d supply, fill #0
  Filled 2023-12-05: qty 5, 25d supply, fill #0
  Filled 2024-01-14: qty 5, 25d supply, fill #1
  Filled 2024-04-16: qty 5, 25d supply, fill #2
  Filled 2024-07-09: qty 5, 25d supply, fill #3

## 2023-12-05 ENCOUNTER — Other Ambulatory Visit (HOSPITAL_COMMUNITY): Payer: Self-pay

## 2023-12-05 ENCOUNTER — Other Ambulatory Visit: Payer: Self-pay

## 2023-12-16 ENCOUNTER — Other Ambulatory Visit (HOSPITAL_COMMUNITY): Payer: Self-pay

## 2023-12-16 DIAGNOSIS — G4733 Obstructive sleep apnea (adult) (pediatric): Secondary | ICD-10-CM | POA: Diagnosis not present

## 2023-12-16 DIAGNOSIS — I10 Essential (primary) hypertension: Secondary | ICD-10-CM | POA: Diagnosis not present

## 2023-12-16 DIAGNOSIS — R0683 Snoring: Secondary | ICD-10-CM | POA: Diagnosis not present

## 2023-12-18 DIAGNOSIS — N184 Chronic kidney disease, stage 4 (severe): Secondary | ICD-10-CM | POA: Diagnosis not present

## 2023-12-21 ENCOUNTER — Other Ambulatory Visit (HOSPITAL_COMMUNITY): Payer: Self-pay

## 2023-12-23 ENCOUNTER — Other Ambulatory Visit (HOSPITAL_COMMUNITY): Payer: Self-pay

## 2023-12-23 ENCOUNTER — Other Ambulatory Visit: Payer: Self-pay

## 2023-12-23 DIAGNOSIS — N1832 Chronic kidney disease, stage 3b: Secondary | ICD-10-CM | POA: Diagnosis not present

## 2023-12-23 DIAGNOSIS — E039 Hypothyroidism, unspecified: Secondary | ICD-10-CM | POA: Diagnosis not present

## 2023-12-23 DIAGNOSIS — E1122 Type 2 diabetes mellitus with diabetic chronic kidney disease: Secondary | ICD-10-CM | POA: Diagnosis not present

## 2023-12-23 DIAGNOSIS — R809 Proteinuria, unspecified: Secondary | ICD-10-CM | POA: Diagnosis not present

## 2023-12-23 DIAGNOSIS — N184 Chronic kidney disease, stage 4 (severe): Secondary | ICD-10-CM | POA: Diagnosis not present

## 2023-12-23 DIAGNOSIS — N2581 Secondary hyperparathyroidism of renal origin: Secondary | ICD-10-CM | POA: Diagnosis not present

## 2023-12-23 DIAGNOSIS — I5032 Chronic diastolic (congestive) heart failure: Secondary | ICD-10-CM | POA: Diagnosis not present

## 2023-12-23 DIAGNOSIS — E7800 Pure hypercholesterolemia, unspecified: Secondary | ICD-10-CM | POA: Diagnosis not present

## 2023-12-23 DIAGNOSIS — I129 Hypertensive chronic kidney disease with stage 1 through stage 4 chronic kidney disease, or unspecified chronic kidney disease: Secondary | ICD-10-CM | POA: Diagnosis not present

## 2023-12-23 MED ORDER — KERENDIA 20 MG PO TABS
20.0000 mg | ORAL_TABLET | Freq: Every day | ORAL | 3 refills | Status: DC
  Filled 2023-12-23: qty 90, 90d supply, fill #0
  Filled 2024-03-31: qty 90, 90d supply, fill #1
  Filled 2024-06-29: qty 90, 90d supply, fill #2

## 2023-12-24 ENCOUNTER — Other Ambulatory Visit (HOSPITAL_COMMUNITY): Payer: Self-pay

## 2023-12-25 ENCOUNTER — Other Ambulatory Visit (HOSPITAL_COMMUNITY): Payer: Self-pay

## 2023-12-26 ENCOUNTER — Other Ambulatory Visit (HOSPITAL_COMMUNITY): Payer: Self-pay

## 2023-12-26 MED ORDER — INSULIN PEN NEEDLE 32G X 4 MM MISC
4 refills | Status: AC
  Filled 2023-12-26: qty 300, 75d supply, fill #0
  Filled 2023-12-26: qty 300, 90d supply, fill #0
  Filled 2024-01-01: qty 400, 90d supply, fill #0
  Filled 2024-03-31: qty 400, 90d supply, fill #1
  Filled 2024-12-07: qty 400, 90d supply, fill #2

## 2024-01-01 MED FILL — Sacubitril-Valsartan Tab 49-51 MG: ORAL | 30 days supply | Qty: 60 | Fill #4 | Status: AC

## 2024-01-02 ENCOUNTER — Other Ambulatory Visit: Payer: Self-pay

## 2024-01-02 ENCOUNTER — Other Ambulatory Visit (HOSPITAL_COMMUNITY): Payer: Self-pay

## 2024-01-03 ENCOUNTER — Other Ambulatory Visit (HOSPITAL_COMMUNITY): Payer: Self-pay

## 2024-01-07 ENCOUNTER — Other Ambulatory Visit (HOSPITAL_COMMUNITY): Payer: Self-pay

## 2024-01-14 ENCOUNTER — Other Ambulatory Visit: Payer: Self-pay

## 2024-01-14 ENCOUNTER — Other Ambulatory Visit (HOSPITAL_COMMUNITY): Payer: Self-pay

## 2024-01-16 DIAGNOSIS — G4733 Obstructive sleep apnea (adult) (pediatric): Secondary | ICD-10-CM | POA: Diagnosis not present

## 2024-01-16 DIAGNOSIS — I10 Essential (primary) hypertension: Secondary | ICD-10-CM | POA: Diagnosis not present

## 2024-01-16 DIAGNOSIS — R0683 Snoring: Secondary | ICD-10-CM | POA: Diagnosis not present

## 2024-01-17 ENCOUNTER — Telehealth (HOSPITAL_COMMUNITY): Payer: Self-pay

## 2024-01-17 NOTE — Telephone Encounter (Signed)
 Called and spoke to pt's wife Brendan [name] to confirm/remind patient of their appointment at the [facility] on 01/20/24.   Patient reminded to bring all medications and/or complete list.  Confirmed patient has transportation. Gave directions, instructed to utilize valet parking.  Confirmed appointment prior to ending call.

## 2024-01-19 NOTE — Progress Notes (Signed)
 ADVANCED [facility] CLINIC NOTE  PCP: Dr. Nehemiah Settle HF Cardiologist: Dr. Gala Romney  Chief Complaint: Heart Failure Follow up  HPI: Brendan [name] is a 68 y.o. male with history of HFpEF, HTN, OSA on CPAP, DM2, hypothyroidism, CKD 3b (creatinine ~2.4) and obesity.   Admitted to [facility] 03/25/20 with A/C diastolic heart failure. Discharge weight 301 pounds.   Admitted 01/07/2023 with A/C HFpEF. High sodium diet played a role in volume overload. Overall diuresed 23 pounds. Transitioned to torsemide 80 mg daily. GDMT adjusted. Enrolled in FASTR trial usual care arm. Discharge weight 294 pounds.   Follow up 2/24, chronic NYHA II and volume stable on torsemide 20 mg daily. Weight 300 lbs.   Follow up 4/24, NYHA II-early III, weight down 10 lbs, REDs 40%. Struggling with orthostasis and amlodipine stopped.   Today he returns for HF follow up. Overall feeling okay. Half and half bad days and good days. SBP runs 120-130s at home. Fatigue and SOB with exertion, needs to rest often. Felt okay walking about to clinic. No CP, palpations, or PND. Taking all medications. Weight stable at home.   ReDs reading: 31 %, normal  Cardiac Studies - Echo (2024): EF 55-60% RV ok. Grade II DD.  - Echo (2023): EF preserved Grade 1 DD.  - Echo (5/21): EF 55-60% Grade II DD. Moderate LVH , RV normal   Past Medical History:  Diagnosis Date   Acanthosis nigricans    Atopic dermatitis    CHF (congestive heart failure) (HCC)    CKD (chronic kidney disease) stage 4, GFR 15-29 ml/min (HCC) 01/08/2023   Diabetes (HCC) 11/19/2002   Erectile dysfunction    Fatty liver 11/19/2005   Glaucoma 11/19/2006   Gynecomastia 11/20/2007   Hx of adenomatous colonic polyps 08/26/2023   Hyperaldosteronism (HCC) 11/19/1998   Hypercholesterolemia    Hyperlipidemia 2010   Hypertension 1999   Hypoglycemic reaction    Hypothyroidism 11/19/2002   Incontinence    Intermittent vertigo 11/19/2010   Left cervical radiculopathy 11/19/2010   Lumbar  radiculopathy    Obesity    Peripheral neuropathy    Pollen allergies 11/19/2005   perennial   Prostate cancer (HCC) 11/19/2004   Reflux esophagitis 11/19/1993   Sickle cell trait (HCC) 11/19/2004   Sleep apnea, obstructive 11/19/1998   uses a cpap   Venous insufficiency 11/20/2003   Vitamin D deficiency 11/19/2010   Current Outpatient Medications  Medication Sig Dispense Refill   acetaminophen (TYLENOL) 500 MG tablet Take 500 mg by mouth every 6 (six) hours as needed for mild pain.     Blood Glucose Monitoring Suppl (ONE TOUCH ULTRA SYSTEM KIT) W/DEVICE KIT 1 kit by Does not apply route once. One touch ultrasoft lancets     brimonidine (ALPHAGAN) 0.15 % ophthalmic solution INSTILL 1 DROP IN EACH EYE TWICE DAILY. 5 mL 5   brimonidine-timolol (COMBIGAN) 0.2-0.5 % ophthalmic solution Place 1 drop into both eyes every 12 (twelve) hours.     carvedilol (COREG) 25 MG tablet Take 1 tablet (25 mg) by mouth 2  times daily with food 180 tablet 3   Continuous Glucose Sensor (FREESTYLE LIBRE 3 SENSOR) MISC Apply to upper back of arm. Change every 14 days. 6 each 3   dapagliflozin propanediol (FARXIGA) 10 MG TABS tablet Take 1 tablet (10 mg total) by mouth daily. 90 tablet 3   Finerenone (KERENDIA) 20 MG TABS Take 1 tablet (20 mg total) by mouth every morning for kidney and heart health 90 tablet 3  Finerenone (KERENDIA) 20 MG TABS 1 (one) tablet by mouth every morning for kidney and heart health. 90 tablet 3   HUMALOG KWIKPEN 200 UNIT/ML KwikPen Inject 10-22 Units into the skin 3 (three) times daily. PLUS 4 extra units if blood sugar over 200 mg/dL     hydrALAZINE (APRESOLINE) 100 MG tablet Take 1/2 tablet (50 mg total) by mouth 2 (two) times daily. 90 tablet 3   insulin glargine (LANTUS) 100 UNIT/ML injection Inject 34 Units into the skin daily.     insulin lispro (HUMALOG KWIKPEN) 200 UNIT/ML KwikPen Inject under the skin 12 units at small meals and 24 units at regular meals  3 times a day 75 mL 3    Insulin Pen Needle (TECHLITE PEN NEEDLES) 32G X 4 MM MISC Use to inject insulin 4 times daily 400 each 4   Insulin Pen Needle 32G X 4 MM MISC Use to inject insulin up to 4 times daily. 400 each 4   levothyroxine (SYNTHROID) 125 MCG tablet Take 1 tablet (125 mcg total) by mouth every morning on an empty stomach 30 tablet 11   potassium chloride SA (KLOR-CON M) 20 MEQ tablet Take 2 tablets (40 mEq) by mouth daily. 90 tablet 3   sacubitril-valsartan (ENTRESTO) 49-51 MG Take 1 tablet by mouth 2 (two) times daily. 60 tablet 8   simvastatin (ZOCOR) 20 MG tablet Take 1 tablet (20 mg total) by mouth every evening. 90 tablet 3   timolol (TIMOPTIC) 0.5 % ophthalmic solution INSTILL 1 DROP IN EACH EYE TWICE DAILY. 5 mL 5   tirzepatide (MOUNJARO) 10 MG/0.5ML Pen Inject 10 mg into the skin once a week. 2 mL 12   torsemide (DEMADEX) 20 MG tablet Take 3 tablets (60 mg total) by mouth daily. (Patient taking differently: Take 20 mg by mouth daily.) 90 tablet 11   No current facility-administered medications for this encounter.   Allergies  Allergen Reactions   Ace Inhibitors Cough   Maxidex [Dexamethasone] Other (See Comments)    Sexual dysfunction   Verapamil Other (See Comments)    constipation   Social History   Socioeconomic History   Marital status: Married    Spouse name: Not on file   Number of children: 3   Years of education: Not on file   Highest education level: Not on file  Occupational History   Occupation: truck driver    Comment: Retired Futures trader  Tobacco Use   Smoking status: Never   Smokeless tobacco: Never  Vaping Use   Vaping status: Never Used  Substance and Sexual Activity   Alcohol use: Yes    Comment: one drink every few months - 1965   Drug use: No   Sexual activity: Yes  Other Topics Concern   Not on file  Social History Narrative   Right handed   Lives in a two story home    Drinks no caffeine    Social Drivers of Corporate investment banker  Strain: Low Risk  (01/08/2023)   Overall Financial Resource Strain (CARDIA)    Difficulty of Paying Living Expenses: Not hard at all  Food Insecurity: No Food Insecurity (01/08/2023)   Hunger Vital Sign    Worried About Running Out of Food in the Last Year: Never true    Ran Out of Food in the Last Year: Never true  Transportation Needs: No Transportation Needs (01/08/2023)   PRAPARE - Transportation    Lack of Transportation (Medical): No    Lack of  Transportation (Non-Medical): No  Physical Activity: Not on file  Stress: Not on file  Social Connections: Unknown (04/03/2022)   Received from [facility]   Social Network    Social Network: Not on file  Intimate Partner Violence: Unknown (02/23/2022)   Received from [facility], [facility]   HITS    Physically Hurt: Not on file    Insult or Talk Down To: Not on file    Threaten Physical Harm: Not on file    Scream or Curse: Not on file   Family History  Problem Relation Age of Onset   Heart failure Mother    Hypertension Father        deceased age 52   Heart attack Father    COPD Sister    CVA Sister    Diabetes Daughter    Colon cancer Neg Hx    Stomach cancer Neg Hx    Colon polyps Neg Hx    Esophageal cancer Neg Hx    Rectal cancer Neg Hx    BP (!) 170/90   Pulse 66   Ht 6\' 2"  (1.88 m)   Wt 135.1 kg (297 lb 12.8 oz)   SpO2 95%   BMI 38.24 kg/m   Wt Readings from Last 3 Encounters:  01/20/24 135.1 kg (297 lb 12.8 oz)  08/19/23 136.1 kg (300 lb)  08/05/23 (!) 142.9 kg (315 lb)   PHYSICAL EXAM: General: Well appearing. Walked into clinic. Cardiac: JVP difficult to visualize. S1 and S2 present. No murmurs or rub. Resp: Lung sounds clear and equal B/L Abdomen: Obese, non-tender, non-distended. + BS. Extremities: Warm and dry. No rash, cyanosis.  1+ pitting edema.  Neuro: Alert and oriented x3. Affect pleasant. Moves all extremities without difficulty.  ASSESSMENT & PLAN: 1. Chronic Diastolic  Heart Failure, - Echo 5/21 EF preserved EF LVH Grade II DD - Echo (2/24): EF 55-60% RV ok. Grade II DD.  - Stable NYHA III, functional class confounded by body habitus.  - Volume okay by exam. ReDs 31%. Torsemide at 20 mg daily (decreased by Dr. Marisue Humble for Cr) + KCL to 40 daily. BNP/BMET today - Continue Finerenone 20 mg qhs. - Continue Entresto 49/51 mg bid.  - Continue Coreg 25 mg bid. - Continue Farxiga 10 mg daily.  - Continue hydralazine 50 mg bid  - BNP/BMET today - wear compression hose.  2. HTN  - BP high here but well controlled at home. Repeat in office 140/70. - Pre-syncope with SBP <130 - Follows with Dr. Duke Salvia in HTN program  3. DMII - Hgb A1C 8.6  - On SGLT2i - Followed by Endocrinology   4. OSA - Continue CPAP nightly.   5. Obesity  - Body mass index is 38.24 kg/m.  - On Mounjaro - Down 15 lbs  6. CKD Stage IV - Baseline SCr 2.4-2.8 - Followed by Dr Marisue Humble - Labs today  Follow up in 1 month with APP  Brendan Kjuan Seipp, NP  10:11 AM

## 2024-01-20 ENCOUNTER — Ambulatory Visit (HOSPITAL_COMMUNITY)
Admission: RE | Admit: 2024-01-20 | Discharge: 2024-01-20 | Disposition: A | Discharge status: Home / Self Care | Payer: Medicare Other | Source: Ambulatory Visit | Attending: Cardiology | Admitting: Cardiology

## 2024-01-20 ENCOUNTER — Encounter (HOSPITAL_COMMUNITY): Payer: Self-pay

## 2024-01-20 VITALS — BP 170/90 | HR 66 | Ht 74.0 in | Wt 297.8 lb

## 2024-01-20 DIAGNOSIS — E1122 Type 2 diabetes mellitus with diabetic chronic kidney disease: Secondary | ICD-10-CM | POA: Insufficient documentation

## 2024-01-20 DIAGNOSIS — G4733 Obstructive sleep apnea (adult) (pediatric): Secondary | ICD-10-CM | POA: Diagnosis not present

## 2024-01-20 DIAGNOSIS — E039 Hypothyroidism, unspecified: Secondary | ICD-10-CM | POA: Insufficient documentation

## 2024-01-20 DIAGNOSIS — I10 Essential (primary) hypertension: Secondary | ICD-10-CM

## 2024-01-20 DIAGNOSIS — I5032 Chronic diastolic (congestive) heart failure: Secondary | ICD-10-CM | POA: Diagnosis not present

## 2024-01-20 DIAGNOSIS — E66812 Obesity, class 2: Secondary | ICD-10-CM

## 2024-01-20 DIAGNOSIS — E669 Obesity, unspecified: Secondary | ICD-10-CM | POA: Insufficient documentation

## 2024-01-20 DIAGNOSIS — I130 Hypertensive heart and chronic kidney disease with heart failure and stage 1 through stage 4 chronic kidney disease, or unspecified chronic kidney disease: Secondary | ICD-10-CM | POA: Insufficient documentation

## 2024-01-20 DIAGNOSIS — Z7985 Long-term (current) use of injectable non-insulin antidiabetic drugs: Secondary | ICD-10-CM | POA: Insufficient documentation

## 2024-01-20 DIAGNOSIS — Z6838 Body mass index (BMI) 38.0-38.9, adult: Secondary | ICD-10-CM | POA: Insufficient documentation

## 2024-01-20 DIAGNOSIS — N184 Chronic kidney disease, stage 4 (severe): Secondary | ICD-10-CM | POA: Diagnosis not present

## 2024-01-20 DIAGNOSIS — Z794 Long term (current) use of insulin: Secondary | ICD-10-CM | POA: Insufficient documentation

## 2024-01-20 DIAGNOSIS — Z79899 Other long term (current) drug therapy: Secondary | ICD-10-CM | POA: Diagnosis not present

## 2024-01-20 LAB — BASIC METABOLIC PANEL
Anion gap: 11 (ref 5–15)
BUN: 43 mg/dL — ABNORMAL HIGH (ref 8–23)
CO2: 25 mmol/L (ref 22–32)
Calcium: 8.6 mg/dL — ABNORMAL LOW (ref 8.9–10.3)
Chloride: 100 mmol/L (ref 98–111)
Creatinine, Ser: 3.09 mg/dL — ABNORMAL HIGH (ref 0.61–1.24)
GFR, Estimated: 21 mL/min — ABNORMAL LOW (ref >60)
Glucose, Bld: 208 mg/dL — ABNORMAL HIGH (ref 70–99)
Potassium: 3.8 mmol/L (ref 3.5–5.1)
Sodium: 136 mmol/L (ref 135–145)

## 2024-01-20 LAB — BRAIN NATRIURETIC PEPTIDE: B Natriuretic Peptide: 295.6 pg/mL — ABNORMAL HIGH (ref 0.0–100.0)

## 2024-01-20 NOTE — Progress Notes (Signed)
   ReDS Vest / Clip - 01/20/24 1000       ReDS Vest / Clip   Station Marker D    Ruler Value 36    ReDS Value Range Low volume    ReDS Actual Value 31

## 2024-01-20 NOTE — Patient Instructions (Signed)
 No medication changes today. Labs today - will call you if abnormal. Return to [facility] in one month - see below. Please call us at [phone] if any questions or concerns prior to your next visit.

## 2024-01-28 ENCOUNTER — Other Ambulatory Visit (HOSPITAL_COMMUNITY): Payer: Self-pay

## 2024-01-28 MED ORDER — FREESTYLE LIBRE 3 SENSOR MISC
3 refills | Status: DC
  Filled 2024-01-28: qty 6, 84d supply, fill #0
  Filled 2024-03-31 – 2024-04-06 (×2): qty 6, 84d supply, fill #1
  Filled 2024-07-17 – 2024-08-21 (×3): qty 6, 84d supply, fill #2

## 2024-01-29 ENCOUNTER — Other Ambulatory Visit (HOSPITAL_COMMUNITY): Payer: Self-pay

## 2024-02-04 MED FILL — Sacubitril-Valsartan Tab 49-51 MG: ORAL | 30 days supply | Qty: 60 | Fill #5 | Status: AC

## 2024-02-10 DIAGNOSIS — M25562 Pain in left knee: Secondary | ICD-10-CM | POA: Diagnosis not present

## 2024-02-18 ENCOUNTER — Other Ambulatory Visit (HOSPITAL_COMMUNITY): Payer: Self-pay

## 2024-02-19 ENCOUNTER — Other Ambulatory Visit (HOSPITAL_COMMUNITY): Payer: Self-pay

## 2024-02-21 ENCOUNTER — Other Ambulatory Visit (HOSPITAL_COMMUNITY): Payer: Self-pay

## 2024-02-24 ENCOUNTER — Encounter (HOSPITAL_COMMUNITY)

## 2024-02-24 DIAGNOSIS — H524 Presbyopia: Secondary | ICD-10-CM | POA: Diagnosis not present

## 2024-02-24 DIAGNOSIS — H401131 Primary open-angle glaucoma, bilateral, mild stage: Secondary | ICD-10-CM | POA: Diagnosis not present

## 2024-03-02 ENCOUNTER — Other Ambulatory Visit (HOSPITAL_COMMUNITY): Payer: Self-pay

## 2024-03-02 DIAGNOSIS — N1832 Chronic kidney disease, stage 3b: Secondary | ICD-10-CM | POA: Diagnosis not present

## 2024-03-02 DIAGNOSIS — Z794 Long term (current) use of insulin: Secondary | ICD-10-CM | POA: Diagnosis not present

## 2024-03-02 DIAGNOSIS — E1142 Type 2 diabetes mellitus with diabetic polyneuropathy: Secondary | ICD-10-CM | POA: Diagnosis not present

## 2024-03-02 DIAGNOSIS — G4730 Sleep apnea, unspecified: Secondary | ICD-10-CM | POA: Diagnosis not present

## 2024-03-02 DIAGNOSIS — E039 Hypothyroidism, unspecified: Secondary | ICD-10-CM | POA: Diagnosis not present

## 2024-03-02 DIAGNOSIS — E1122 Type 2 diabetes mellitus with diabetic chronic kidney disease: Secondary | ICD-10-CM | POA: Diagnosis not present

## 2024-03-02 DIAGNOSIS — E1165 Type 2 diabetes mellitus with hyperglycemia: Secondary | ICD-10-CM | POA: Diagnosis not present

## 2024-03-02 MED ORDER — MOUNJARO 12.5 MG/0.5ML ~~LOC~~ SOAJ
12.5000 mg | SUBCUTANEOUS | 11 refills | Status: DC
  Filled 2024-03-02 – 2024-03-17 (×2): qty 2, 28d supply, fill #0
  Filled 2024-04-11: qty 2, 28d supply, fill #1

## 2024-03-05 MED FILL — Sacubitril-Valsartan Tab 49-51 MG: ORAL | 30 days supply | Qty: 60 | Fill #6 | Status: AC

## 2024-03-07 ENCOUNTER — Other Ambulatory Visit (HOSPITAL_COMMUNITY): Payer: Self-pay

## 2024-03-09 ENCOUNTER — Telehealth: Payer: Self-pay | Admitting: Pharmacist

## 2024-03-09 NOTE — Progress Notes (Signed)
   03/09/2024  Patient ID: Clemon Cypress., male   DOB: 1956-08-10, 68 y.o.   MRN: [id number]  Patient was identified for DM counseling based on A1c greater than 8% on last lab results.   Patient declined to schedule for a free DM initial visit with me as the pharmacist. Arnetta Lank he was already following with Dr. Kathyanne Parkers and did not feel extra support was needed at this time. Increasing Mounjaro  in 1 week and was told to call Dr. Kathyanne Parkers regarding updates 2 weeks after. If interested in scheduling in the future, patient advised to notify PCP.     Delvin File, PharmD [facility]  Phone Number: [phone]

## 2024-03-10 ENCOUNTER — Other Ambulatory Visit (HOSPITAL_COMMUNITY): Payer: Self-pay

## 2024-03-10 ENCOUNTER — Telehealth (HOSPITAL_COMMUNITY): Payer: Self-pay | Admitting: *Deleted

## 2024-03-10 ENCOUNTER — Encounter (HOSPITAL_COMMUNITY): Payer: Self-pay

## 2024-03-10 ENCOUNTER — Ambulatory Visit (HOSPITAL_COMMUNITY)
Admission: RE | Admit: 2024-03-10 | Discharge: 2024-03-10 | Disposition: A | Discharge status: Home / Self Care | Source: Ambulatory Visit | Attending: Family Medicine | Admitting: Family Medicine

## 2024-03-10 VITALS — BP 138/74 | HR 68 | Wt 295.2 lb

## 2024-03-10 DIAGNOSIS — E669 Obesity, unspecified: Secondary | ICD-10-CM | POA: Diagnosis not present

## 2024-03-10 DIAGNOSIS — Z7182 Exercise counseling: Secondary | ICD-10-CM | POA: Diagnosis not present

## 2024-03-10 DIAGNOSIS — E039 Hypothyroidism, unspecified: Secondary | ICD-10-CM | POA: Diagnosis not present

## 2024-03-10 DIAGNOSIS — Z6837 Body mass index (BMI) 37.0-37.9, adult: Secondary | ICD-10-CM | POA: Insufficient documentation

## 2024-03-10 DIAGNOSIS — I10 Essential (primary) hypertension: Secondary | ICD-10-CM

## 2024-03-10 DIAGNOSIS — Z794 Long term (current) use of insulin: Secondary | ICD-10-CM | POA: Insufficient documentation

## 2024-03-10 DIAGNOSIS — G4733 Obstructive sleep apnea (adult) (pediatric): Secondary | ICD-10-CM | POA: Diagnosis not present

## 2024-03-10 DIAGNOSIS — Z7984 Long term (current) use of oral hypoglycemic drugs: Secondary | ICD-10-CM | POA: Insufficient documentation

## 2024-03-10 DIAGNOSIS — Z79899 Other long term (current) drug therapy: Secondary | ICD-10-CM | POA: Diagnosis not present

## 2024-03-10 DIAGNOSIS — Z7985 Long-term (current) use of injectable non-insulin antidiabetic drugs: Secondary | ICD-10-CM | POA: Insufficient documentation

## 2024-03-10 DIAGNOSIS — R2681 Unsteadiness on feet: Secondary | ICD-10-CM | POA: Insufficient documentation

## 2024-03-10 DIAGNOSIS — I130 Hypertensive heart and chronic kidney disease with heart failure and stage 1 through stage 4 chronic kidney disease, or unspecified chronic kidney disease: Secondary | ICD-10-CM | POA: Insufficient documentation

## 2024-03-10 DIAGNOSIS — Z6838 Body mass index (BMI) 38.0-38.9, adult: Secondary | ICD-10-CM

## 2024-03-10 DIAGNOSIS — N184 Chronic kidney disease, stage 4 (severe): Secondary | ICD-10-CM | POA: Insufficient documentation

## 2024-03-10 DIAGNOSIS — E1122 Type 2 diabetes mellitus with diabetic chronic kidney disease: Secondary | ICD-10-CM | POA: Diagnosis not present

## 2024-03-10 DIAGNOSIS — E118 Type 2 diabetes mellitus with unspecified complications: Secondary | ICD-10-CM | POA: Diagnosis not present

## 2024-03-10 DIAGNOSIS — I5032 Chronic diastolic (congestive) heart failure: Secondary | ICD-10-CM | POA: Diagnosis not present

## 2024-03-10 DIAGNOSIS — I498 Other specified cardiac arrhythmias: Secondary | ICD-10-CM | POA: Diagnosis not present

## 2024-03-10 DIAGNOSIS — R55 Syncope and collapse: Secondary | ICD-10-CM | POA: Diagnosis not present

## 2024-03-10 DIAGNOSIS — E66812 Obesity, class 2: Secondary | ICD-10-CM

## 2024-03-10 LAB — CBC
HCT: 36.5 % — ABNORMAL LOW (ref 39.0–52.0)
Hemoglobin: 12.2 g/dL — ABNORMAL LOW (ref 13.0–17.0)
MCH: 28 pg (ref 26.0–34.0)
MCHC: 33.4 g/dL (ref 30.0–36.0)
MCV: 83.9 fL (ref 80.0–100.0)
Platelets: 309 10*3/uL (ref 150–400)
RBC: 4.35 MIL/uL (ref 4.22–5.81)
RDW: 13.7 % (ref 11.5–15.5)
WBC: 12.1 10*3/uL — ABNORMAL HIGH (ref 4.0–10.5)
nRBC: 0 % (ref 0.0–0.2)

## 2024-03-10 LAB — BASIC METABOLIC PANEL WITH GFR
Anion gap: 10 (ref 5–15)
BUN: 33 mg/dL — ABNORMAL HIGH (ref 8–23)
CO2: 26 mmol/L (ref 22–32)
Calcium: 8.6 mg/dL — ABNORMAL LOW (ref 8.9–10.3)
Chloride: 101 mmol/L (ref 98–111)
Creatinine, Ser: 2.88 mg/dL — ABNORMAL HIGH (ref 0.61–1.24)
GFR, Estimated: 23 mL/min — ABNORMAL LOW (ref >60)
Glucose, Bld: 235 mg/dL — ABNORMAL HIGH (ref 70–99)
Potassium: 3.3 mmol/L — ABNORMAL LOW (ref 3.5–5.1)
Sodium: 137 mmol/L (ref 135–145)

## 2024-03-10 LAB — IRON AND TIBC
Iron: 40 ug/dL — ABNORMAL LOW (ref 45–182)
Saturation Ratios: 18 % (ref 17.9–39.5)
TIBC: 218 ug/dL — ABNORMAL LOW (ref 250–450)
UIBC: 178 ug/dL

## 2024-03-10 LAB — FERRITIN: Ferritin: 91 ng/mL (ref 24–336)

## 2024-03-10 MED ORDER — POTASSIUM CHLORIDE CRYS ER 20 MEQ PO TBCR
60.0000 meq | EXTENDED_RELEASE_TABLET | Freq: Every day | ORAL | 3 refills | Status: DC
  Filled 2024-03-10 – 2024-03-17 (×2): qty 270, 90d supply, fill #0

## 2024-03-10 NOTE — Progress Notes (Signed)
 ADVANCED HF CLINIC NOTE  PCP: Dr. Joice Nares HF Cardiologist: Dr. Julane Ny  HPI: [name] is a 68 y.o. male with history of HFpEF, HTN, OSA on CPAP, DM2, hypothyroidism, CKD 3b (creatinine ~2.4) and obesity.   Admitted to [facility] 03/25/20 with A/C diastolic heart failure. Discharge weight 301 pounds.   Admitted 01/07/2023 with A/C HFpEF. High sodium diet played a role in volume overload. Overall diuresed 23 pounds. Transitioned to torsemide  80 mg daily. GDMT adjusted. Enrolled in FASTR trial usual care arm. Discharge weight 294 pounds.   Follow up 2/24, chronic NYHA II and volume stable on torsemide  20 mg daily. Weight 300 lbs.   Follow up 4/24, NYHA II-early III, weight down 10 lbs, REDs 40%. Struggling with orthostasis and amlodipine  stopped.   Today he returns for HF follow up. Overall feeling fine. Feels a little more SOB today walking into clinic. Gait is unsteady. Denies palpitations, abnormal bleeding, CP, dizziness, edema, or PND/Orthopnea. Appetite ok. No fever or chills. Weight at home 292 pounds. Taking all medications. Wears CPAP.  Cardiac Studies - Echo (2024): EF 55-60% RV ok. Grade II DD.  - Echo (2023): EF preserved Grade 1 DD.  - Echo (5/21): EF 55-60% Grade II DD. Moderate LVH , RV normal   Past Medical History:  Diagnosis Date   Acanthosis nigricans    Atopic dermatitis    CHF (congestive heart failure) (HCC)    CKD (chronic kidney disease) stage 4, GFR 15-29 ml/min (HCC) 01/08/2023   Diabetes (HCC) 11/19/2002   Erectile dysfunction    Fatty liver 11/19/2005   Glaucoma 11/19/2006   Gynecomastia 11/20/2007   Hx of adenomatous colonic polyps 08/26/2023   Hyperaldosteronism (HCC) 11/19/1998   Hypercholesterolemia    Hyperlipidemia 2010   Hypertension 1999   Hypoglycemic reaction    Hypothyroidism 11/19/2002   Incontinence    Intermittent vertigo 11/19/2010   Left cervical radiculopathy 11/19/2010   Lumbar radiculopathy    Obesity    Peripheral neuropathy     Pollen allergies 11/19/2005   perennial   Prostate cancer (HCC) 11/19/2004   Reflux esophagitis 11/19/1993   Sickle cell trait (HCC) 11/19/2004   Sleep apnea, obstructive 11/19/1998   uses a cpap   Venous insufficiency 11/20/2003   Vitamin D deficiency 11/19/2010   Current Outpatient Medications  Medication Sig Dispense Refill   acetaminophen  (TYLENOL ) 500 MG tablet Take 500 mg by mouth every 6 (six) hours as needed for mild pain.     Blood Glucose Monitoring Suppl (ONE TOUCH ULTRA SYSTEM KIT) W/DEVICE KIT 1 kit by Does not apply route once. One touch ultrasoft lancets     brimonidine  (ALPHAGAN ) 0.15 % ophthalmic solution INSTILL 1 DROP IN EACH EYE TWICE DAILY. 5 mL 5   carvedilol  (COREG ) 25 MG tablet Take 1 tablet (25 mg) by mouth 2  times daily with food 180 tablet 3   Continuous Glucose Sensor (FREESTYLE LIBRE 3 SENSOR) MISC Apply to upper back of arm and change ever 14 days. 6 each 3   dapagliflozin  propanediol (FARXIGA ) 10 MG TABS tablet Take 1 tablet (10 mg total) by mouth daily. 90 tablet 3   Finerenone  (KERENDIA ) 20 MG TABS 1 (one) tablet by mouth every morning for kidney and heart health. 90 tablet 3   HUMALOG  KWIKPEN 200 UNIT/ML KwikPen Inject 10-22 Units into the skin 3 (three) times daily. PLUS 4 extra units if blood sugar over 200 mg/dL     hydrALAZINE  (APRESOLINE ) 100 MG tablet Take 1/2 tablet (50 mg  total) by mouth 2 (two) times daily. 90 tablet 3   insulin  glargine (LANTUS ) 100 UNIT/ML injection Inject 34 Units into the skin daily.     insulin  lispro (HUMALOG  KWIKPEN) 200 UNIT/ML KwikPen Inject under the skin 12 units at small meals and 24 units at regular meals  3 times a day 75 mL 3   Insulin  Pen Needle (TECHLITE PEN NEEDLES) 32G X 4 MM MISC Use to inject insulin  4 times daily 400 each 4   Insulin  Pen Needle 32G X 4 MM MISC Use to inject insulin  up to 4 times daily. 400 each 4   levothyroxine  (SYNTHROID ) 125 MCG tablet Take 1 tablet (125 mcg total) by mouth every morning on  an empty stomach 30 tablet 11   potassium chloride  SA (KLOR-CON  M) 20 MEQ tablet Take 2 tablets (40 mEq) by mouth daily. 90 tablet 3   sacubitril -valsartan  (ENTRESTO ) 49-51 MG Take 1 tablet by mouth 2 (two) times daily. 60 tablet 8   simvastatin  (ZOCOR ) 20 MG tablet Take 1 tablet (20 mg total) by mouth every evening. 90 tablet 3   timolol  (TIMOPTIC ) 0.5 % ophthalmic solution INSTILL 1 DROP IN EACH EYE TWICE DAILY. 5 mL 5   tirzepatide  (MOUNJARO ) 10 MG/0.5ML Pen Inject 10 mg into the skin once a week. 2 mL 12   torsemide  (DEMADEX ) 20 MG tablet Take 3 tablets (60 mg total) by mouth daily. (Patient taking differently: Take 20 mg by mouth daily.) 90 tablet 11   tirzepatide  (MOUNJARO ) 12.5 MG/0.5ML Pen Inject 12.5 mg into the skin once a week. (Patient not taking: Reported on 03/10/2024) 2 mL 11   No current facility-administered medications for this encounter.   Allergies  Allergen Reactions   Ace Inhibitors Cough   Maxidex [Dexamethasone] Other (See Comments)    Sexual dysfunction   Verapamil Other (See Comments)    constipation   Social History   Socioeconomic History   Marital status: Married    Spouse name: Not on file   Number of children: 3   Years of education: Not on file   Highest education level: Not on file  Occupational History   Occupation: truck driver    Comment: Retired Futures trader  Tobacco Use   Smoking status: Never   Smokeless tobacco: Never  Vaping Use   Vaping status: Never Used  Substance and Sexual Activity   Alcohol use: Yes    Comment: one drink every few months - 1965   Drug use: No   Sexual activity: Yes  Other Topics Concern   Not on file  Social History Narrative   Right handed   Lives in a two story home    Drinks no caffeine    Social Drivers of Corporate investment banker Strain: Low Risk  (01/08/2023)   Overall Financial Resource Strain (CARDIA)    Difficulty of Paying Living Expenses: Not hard at all  Food Insecurity: No Food  Insecurity (01/08/2023)   Hunger Vital Sign    Worried About Running Out of Food in the Last Year: Never true    Ran Out of Food in the Last Year: Never true  Transportation Needs: No Transportation Needs (01/08/2023)   PRAPARE - Administrator, Civil Service (Medical): No    Lack of Transportation (Non-Medical): No  Physical Activity: Not on file  Stress: Not on file  Social Connections: Unknown (04/03/2022)   Received from [facility]   Social Network    Social Network:  Not on file  Intimate Partner Violence: Unknown (02/23/2022)   Received from [facility], [facility]   HITS    Physically Hurt: Not on file    Insult or Talk Down To: Not on file    Threaten Physical Harm: Not on file    Scream or Curse: Not on file   Family History  Problem Relation Age of Onset   Heart failure Mother    Hypertension Father        deceased age 19   Heart attack Father    COPD Sister    CVA Sister    Diabetes Daughter    Colon cancer Neg Hx    Stomach cancer Neg Hx    Colon polyps Neg Hx    Esophageal cancer Neg Hx    Rectal cancer Neg Hx    BP 138/74   Pulse 68   Wt 133.9 kg (295 lb 3.2 oz)   SpO2 98%   BMI 37.90 kg/m   Wt Readings from Last 3 Encounters:  03/10/24 133.9 kg (295 lb 3.2 oz)  01/20/24 135.1 kg (297 lb 12.8 oz)  08/19/23 136.1 kg (300 lb)   PHYSICAL EXAM: General:  NAD. No resp difficulty, walked into clinic HEENT: Normal Neck: Supple. No JVD. Cor: Regular rate & rhythm. No rubs, gallops or murmurs. Lungs: Clear Abdomen: Soft, obese, nontender, nondistended.  Extremities: No cyanosis, clubbing, rash, trace LE edema Neuro: Alert & oriented x 3, moves all 4 extremities w/o difficulty. Affect pleasant.  ECG (personally reviewed): NSR 69 bpm  ReDs reading: 35 %, normal  ASSESSMENT & PLAN: 1. Chronic Diastolic Heart Failure, - Echo 5/21 EF preserved EF LVH Grade II DD - Echo (2/24): EF 55-60% RV ok. Grade II DD.  - Stable NYHA  III, functional class confounded by body habitus. Volume OK today. ReDs 35% - Continue torsemide  20 mg daily + 40 KCL daily. - Continue finerenone  20 mg qhs. - Continue Entresto  49/51 mg bid.  - Continue Coreg  25 mg bid. - Continue Farxiga  10 mg daily.  - Continue hydralazine  50 mg bid  - wear compression hose. - Update echo - Labs today. - We discussed PREP program today, he will think about it  2. HTN  - BP stable - Pre-syncope with SBP < 130 - Follows with Dr. Theodis Fiscal in HTN program  3. DMII - Hgb A1C 8.6  - On SGLT2i - Followed by Endocrinology   4. OSA - Continue CPAP nightly.  - No change.  5. Obesity  - Body mass index is 37.9 kg/m.  - On Mounjaro  - Down 30 lbs - Encouraged him to increase his physical activity  6. CKD Stage IV - Baseline SCr 2.4-2.8 - Followed by Dr Jearldine Mina - Labs today, check iron panel  Follow up in 6 months with Dr. Bensimhon. Update echo.  Elmarie Hacking, FNP  11:38 AM

## 2024-03-10 NOTE — Telephone Encounter (Signed)
 Called patient per Brendan Good, NP with following:  "Iron is low, Tsat and ferritin are ok.   WBC is mildly elevated, watch for s/s infection   K is low, please increase KCL to 60 daily. Repeat BMET in 2 weeks"  Pt verbalized understanding of same. Med sheet updated, repeat lab ordered and scheduled.

## 2024-03-10 NOTE — Progress Notes (Signed)
 ReDS Vest / Clip - 03/10/24 1200       ReDS Vest / Clip   Station Marker D    Ruler Value 33.5    ReDS Value Range Low volume    ReDS Actual Value 35

## 2024-03-10 NOTE — Patient Instructions (Signed)
 No medication changes today. Labs today - will call you if abnormal. Echo ordered, will obtain prior authorization from you insurance and call you to schedule. Return to see Dr. Julane Ny in 6 months. Please call us  at [phone] in September to schedule this appointment. Please call us  at [phone] if any questions or concerns prior to your next visit.

## 2024-03-17 ENCOUNTER — Other Ambulatory Visit (HOSPITAL_COMMUNITY): Payer: Self-pay

## 2024-03-17 ENCOUNTER — Other Ambulatory Visit: Payer: Self-pay

## 2024-03-18 ENCOUNTER — Other Ambulatory Visit (HOSPITAL_COMMUNITY): Payer: Self-pay

## 2024-03-19 ENCOUNTER — Other Ambulatory Visit (HOSPITAL_COMMUNITY): Payer: Self-pay

## 2024-03-23 DIAGNOSIS — M170 Bilateral primary osteoarthritis of knee: Secondary | ICD-10-CM | POA: Diagnosis not present

## 2024-03-25 ENCOUNTER — Other Ambulatory Visit (HOSPITAL_COMMUNITY): Payer: Self-pay

## 2024-03-26 ENCOUNTER — Ambulatory Visit (HOSPITAL_COMMUNITY)
Admission: RE | Admit: 2024-03-26 | Discharge: 2024-03-26 | Disposition: A | Discharge status: Home / Self Care | Source: Ambulatory Visit | Attending: Internal Medicine | Admitting: Internal Medicine

## 2024-03-26 DIAGNOSIS — I5032 Chronic diastolic (congestive) heart failure: Secondary | ICD-10-CM | POA: Insufficient documentation

## 2024-03-26 LAB — BASIC METABOLIC PANEL WITH GFR
Anion gap: 8 (ref 5–15)
BUN: 35 mg/dL — ABNORMAL HIGH (ref 8–23)
CO2: 25 mmol/L (ref 22–32)
Calcium: 8.5 mg/dL — ABNORMAL LOW (ref 8.9–10.3)
Chloride: 103 mmol/L (ref 98–111)
Creatinine, Ser: 3.08 mg/dL — ABNORMAL HIGH (ref 0.61–1.24)
GFR, Estimated: 21 mL/min — ABNORMAL LOW (ref >60)
Glucose, Bld: 215 mg/dL — ABNORMAL HIGH (ref 70–99)
Potassium: 4 mmol/L (ref 3.5–5.1)
Sodium: 136 mmol/L (ref 135–145)

## 2024-03-31 ENCOUNTER — Other Ambulatory Visit (HOSPITAL_COMMUNITY): Payer: Self-pay

## 2024-04-01 ENCOUNTER — Other Ambulatory Visit (HOSPITAL_COMMUNITY): Payer: Self-pay

## 2024-04-01 ENCOUNTER — Other Ambulatory Visit: Payer: Self-pay

## 2024-04-01 MED ORDER — CARVEDILOL 25 MG PO TABS
25.0000 mg | ORAL_TABLET | Freq: Two times a day (BID) | ORAL | 2 refills | Status: DC
  Filled 2024-04-01 (×2): qty 180, 90d supply, fill #0
  Filled 2024-06-29: qty 180, 90d supply, fill #1

## 2024-04-02 ENCOUNTER — Other Ambulatory Visit: Payer: Self-pay

## 2024-04-03 ENCOUNTER — Other Ambulatory Visit (HOSPITAL_COMMUNITY): Payer: Self-pay

## 2024-04-03 ENCOUNTER — Other Ambulatory Visit: Payer: Self-pay

## 2024-04-03 MED ORDER — FREESTYLE LIBRE 3 PLUS SENSOR MISC
12 refills | Status: AC
  Filled 2024-04-03 – 2024-06-29 (×2): qty 2, 30d supply, fill #0
  Filled 2024-07-16: qty 1, 15d supply, fill #1
  Filled 2024-07-22: qty 2, 30d supply, fill #1
  Filled 2024-08-16 – 2024-08-17 (×2): qty 2, 30d supply, fill #2
  Filled 2024-08-26 (×2): qty 1, 15d supply, fill #2
  Filled 2024-10-19: qty 1, 15d supply, fill #3
  Filled 2024-11-02: qty 2, 30d supply, fill #3
  Filled 2024-11-02: qty 6, 90d supply, fill #3

## 2024-04-06 ENCOUNTER — Other Ambulatory Visit (HOSPITAL_COMMUNITY): Payer: Self-pay

## 2024-04-11 ENCOUNTER — Other Ambulatory Visit (HOSPITAL_COMMUNITY): Payer: Self-pay

## 2024-04-11 MED FILL — Sacubitril-Valsartan Tab 49-51 MG: ORAL | 30 days supply | Qty: 60 | Fill #7 | Status: AC

## 2024-04-14 ENCOUNTER — Other Ambulatory Visit (HOSPITAL_COMMUNITY): Payer: Self-pay

## 2024-04-14 DIAGNOSIS — E1142 Type 2 diabetes mellitus with diabetic polyneuropathy: Secondary | ICD-10-CM | POA: Diagnosis not present

## 2024-04-14 DIAGNOSIS — Z794 Long term (current) use of insulin: Secondary | ICD-10-CM | POA: Diagnosis not present

## 2024-04-14 DIAGNOSIS — N1832 Chronic kidney disease, stage 3b: Secondary | ICD-10-CM | POA: Diagnosis not present

## 2024-04-14 DIAGNOSIS — E039 Hypothyroidism, unspecified: Secondary | ICD-10-CM | POA: Diagnosis not present

## 2024-04-14 DIAGNOSIS — E1122 Type 2 diabetes mellitus with diabetic chronic kidney disease: Secondary | ICD-10-CM | POA: Diagnosis not present

## 2024-04-14 DIAGNOSIS — G4730 Sleep apnea, unspecified: Secondary | ICD-10-CM | POA: Diagnosis not present

## 2024-04-14 DIAGNOSIS — E1165 Type 2 diabetes mellitus with hyperglycemia: Secondary | ICD-10-CM | POA: Diagnosis not present

## 2024-04-14 MED ORDER — MOUNJARO 15 MG/0.5ML ~~LOC~~ SOAJ
15.0000 mg | SUBCUTANEOUS | 4 refills | Status: AC
  Filled 2024-04-14 – 2024-06-14 (×3): qty 6, 84d supply, fill #0
  Filled 2024-09-11: qty 6, 84d supply, fill #1

## 2024-04-14 MED ORDER — LANTUS SOLOSTAR 100 UNIT/ML ~~LOC~~ SOPN
30.0000 [IU] | PEN_INJECTOR | Freq: Every day | SUBCUTANEOUS | 4 refills | Status: DC
  Filled 2024-04-14: qty 45, 150d supply, fill #0
  Filled 2024-04-21: qty 9, 30d supply, fill #0
  Filled 2024-06-29: qty 45, 90d supply, fill #0

## 2024-04-14 MED ORDER — DAPAGLIFLOZIN PROPANEDIOL 10 MG PO TABS
10.0000 mg | ORAL_TABLET | Freq: Every day | ORAL | 4 refills | Status: DC
  Filled 2024-04-14 (×2): qty 90, 90d supply, fill #0
  Filled 2024-07-17: qty 90, 90d supply, fill #1

## 2024-04-15 ENCOUNTER — Other Ambulatory Visit (HOSPITAL_COMMUNITY): Payer: Self-pay

## 2024-04-16 ENCOUNTER — Other Ambulatory Visit (HOSPITAL_COMMUNITY): Payer: Self-pay

## 2024-04-16 ENCOUNTER — Other Ambulatory Visit: Payer: Self-pay

## 2024-04-17 ENCOUNTER — Other Ambulatory Visit (HOSPITAL_COMMUNITY): Payer: Self-pay

## 2024-04-21 ENCOUNTER — Other Ambulatory Visit (HOSPITAL_COMMUNITY): Payer: Self-pay

## 2024-04-23 DIAGNOSIS — I5032 Chronic diastolic (congestive) heart failure: Secondary | ICD-10-CM | POA: Diagnosis not present

## 2024-04-23 DIAGNOSIS — N1832 Chronic kidney disease, stage 3b: Secondary | ICD-10-CM | POA: Diagnosis not present

## 2024-04-23 DIAGNOSIS — N184 Chronic kidney disease, stage 4 (severe): Secondary | ICD-10-CM | POA: Diagnosis not present

## 2024-04-23 DIAGNOSIS — E0840 Diabetes mellitus due to underlying condition with diabetic neuropathy, unspecified: Secondary | ICD-10-CM | POA: Diagnosis not present

## 2024-05-06 ENCOUNTER — Ambulatory Visit (HOSPITAL_COMMUNITY)
Admission: RE | Admit: 2024-05-06 | Discharge: 2024-05-06 | Disposition: A | Discharge status: Home / Self Care | Source: Ambulatory Visit | Attending: Family Medicine | Admitting: Family Medicine

## 2024-05-06 DIAGNOSIS — I5032 Chronic diastolic (congestive) heart failure: Secondary | ICD-10-CM

## 2024-05-07 ENCOUNTER — Ambulatory Visit (HOSPITAL_COMMUNITY)
Admission: RE | Admit: 2024-05-07 | Discharge: 2024-05-07 | Disposition: A | Discharge status: Home / Self Care | Source: Ambulatory Visit | Attending: Family Medicine | Admitting: Family Medicine

## 2024-05-07 DIAGNOSIS — I110 Hypertensive heart disease with heart failure: Secondary | ICD-10-CM | POA: Insufficient documentation

## 2024-05-07 DIAGNOSIS — I358 Other nonrheumatic aortic valve disorders: Secondary | ICD-10-CM | POA: Insufficient documentation

## 2024-05-07 DIAGNOSIS — I5032 Chronic diastolic (congestive) heart failure: Secondary | ICD-10-CM | POA: Insufficient documentation

## 2024-05-07 LAB — ECHOCARDIOGRAM COMPLETE
Area-P 1/2: 2.31 cm2
Calc EF: 52.3 %
S' Lateral: 4.3 cm
Single Plane A2C EF: 45.8 %
Single Plane A4C EF: 55.3 %

## 2024-05-08 ENCOUNTER — Ambulatory Visit (HOSPITAL_COMMUNITY): Payer: Self-pay | Admitting: Cardiology

## 2024-05-11 ENCOUNTER — Other Ambulatory Visit (HOSPITAL_COMMUNITY): Payer: Self-pay

## 2024-05-11 DIAGNOSIS — N184 Chronic kidney disease, stage 4 (severe): Secondary | ICD-10-CM | POA: Diagnosis not present

## 2024-05-11 MED FILL — Sacubitril-Valsartan Tab 49-51 MG: ORAL | 30 days supply | Qty: 60 | Fill #8 | Status: CN

## 2024-05-12 ENCOUNTER — Other Ambulatory Visit (HOSPITAL_COMMUNITY): Payer: Self-pay

## 2024-05-12 MED FILL — Sacubitril-Valsartan Tab 49-51 MG: ORAL | 30 days supply | Qty: 60 | Fill #8 | Status: CN

## 2024-05-13 ENCOUNTER — Other Ambulatory Visit: Payer: Self-pay

## 2024-05-13 ENCOUNTER — Other Ambulatory Visit (HOSPITAL_COMMUNITY): Payer: Self-pay

## 2024-05-13 MED FILL — Sacubitril-Valsartan Tab 49-51 MG: ORAL | 30 days supply | Qty: 60 | Fill #8 | Status: CN

## 2024-05-15 ENCOUNTER — Telehealth (HOSPITAL_COMMUNITY): Payer: Self-pay

## 2024-05-15 ENCOUNTER — Other Ambulatory Visit (HOSPITAL_COMMUNITY): Payer: Self-pay

## 2024-05-15 DIAGNOSIS — E039 Hypothyroidism, unspecified: Secondary | ICD-10-CM | POA: Diagnosis not present

## 2024-05-15 NOTE — Telephone Encounter (Signed)
 Patients wife called reporting that patients insurance is not covering the Entresto  at the moment but that his insurance that does cover will be re instated in August- requesting samples to get through until then.    Advised her that samples have been left at the front desk for pick up.   Advised her to call back with any issues, questions, or concerns, and she verbalized understanding.   Medication Samples have been provided to the patient.  Drug name: Entresto         Strength: 49/51        Qty: 4 bottles   LOT: [id number]  Exp.Date: 08/25  Dosing instructions: take one tablet twice daily   The patient has been instructed regarding the correct time, dose, and frequency of taking this medication, including desired effects and most common side effects.   Brendan [name] Brendan [name] 4:07 PM 05/15/2024

## 2024-05-18 DIAGNOSIS — I1A0 Resistant hypertension: Secondary | ICD-10-CM | POA: Diagnosis not present

## 2024-05-18 DIAGNOSIS — I129 Hypertensive chronic kidney disease with stage 1 through stage 4 chronic kidney disease, or unspecified chronic kidney disease: Secondary | ICD-10-CM | POA: Diagnosis not present

## 2024-05-18 DIAGNOSIS — N184 Chronic kidney disease, stage 4 (severe): Secondary | ICD-10-CM | POA: Diagnosis not present

## 2024-05-18 DIAGNOSIS — E1122 Type 2 diabetes mellitus with diabetic chronic kidney disease: Secondary | ICD-10-CM | POA: Diagnosis not present

## 2024-05-19 ENCOUNTER — Other Ambulatory Visit (HOSPITAL_COMMUNITY): Payer: Self-pay

## 2024-05-19 DIAGNOSIS — L858 Other specified epidermal thickening: Secondary | ICD-10-CM | POA: Diagnosis not present

## 2024-05-19 DIAGNOSIS — L728 Other follicular cysts of the skin and subcutaneous tissue: Secondary | ICD-10-CM | POA: Diagnosis not present

## 2024-05-19 DIAGNOSIS — L2089 Other atopic dermatitis: Secondary | ICD-10-CM | POA: Diagnosis not present

## 2024-05-19 MED ORDER — CLOBETASOL PROPIONATE 0.05 % EX OINT
1.0000 | TOPICAL_OINTMENT | Freq: Every day | CUTANEOUS | 1 refills | Status: DC
  Filled 2024-05-19: qty 60, 60d supply, fill #0

## 2024-05-22 DIAGNOSIS — N184 Chronic kidney disease, stage 4 (severe): Secondary | ICD-10-CM | POA: Diagnosis not present

## 2024-05-22 DIAGNOSIS — N1832 Chronic kidney disease, stage 3b: Secondary | ICD-10-CM | POA: Diagnosis not present

## 2024-05-22 DIAGNOSIS — I5032 Chronic diastolic (congestive) heart failure: Secondary | ICD-10-CM | POA: Diagnosis not present

## 2024-05-22 DIAGNOSIS — E0840 Diabetes mellitus due to underlying condition with diabetic neuropathy, unspecified: Secondary | ICD-10-CM | POA: Diagnosis not present

## 2024-05-26 DIAGNOSIS — L2089 Other atopic dermatitis: Secondary | ICD-10-CM | POA: Diagnosis not present

## 2024-06-02 DIAGNOSIS — E039 Hypothyroidism, unspecified: Secondary | ICD-10-CM | POA: Diagnosis not present

## 2024-06-02 DIAGNOSIS — E1122 Type 2 diabetes mellitus with diabetic chronic kidney disease: Secondary | ICD-10-CM | POA: Diagnosis not present

## 2024-06-02 DIAGNOSIS — N1832 Chronic kidney disease, stage 3b: Secondary | ICD-10-CM | POA: Diagnosis not present

## 2024-06-07 ENCOUNTER — Other Ambulatory Visit (HOSPITAL_COMMUNITY): Payer: Self-pay

## 2024-06-08 ENCOUNTER — Other Ambulatory Visit (HOSPITAL_COMMUNITY): Payer: Self-pay

## 2024-06-08 ENCOUNTER — Other Ambulatory Visit: Payer: Self-pay

## 2024-06-08 MED ORDER — LEVOTHYROXINE SODIUM 125 MCG PO TABS
125.0000 ug | ORAL_TABLET | Freq: Every morning | ORAL | 11 refills | Status: AC
  Filled 2024-06-08: qty 30, 30d supply, fill #0
  Filled 2024-07-09: qty 30, 30d supply, fill #1
  Filled 2024-08-16 – 2024-08-17 (×2): qty 30, 30d supply, fill #2
  Filled 2024-09-11: qty 30, 30d supply, fill #3
  Filled 2024-12-07: qty 30, 30d supply, fill #4

## 2024-06-15 ENCOUNTER — Other Ambulatory Visit (HOSPITAL_COMMUNITY): Payer: Self-pay

## 2024-06-18 DIAGNOSIS — E7800 Pure hypercholesterolemia, unspecified: Secondary | ICD-10-CM | POA: Diagnosis not present

## 2024-06-18 DIAGNOSIS — E039 Hypothyroidism, unspecified: Secondary | ICD-10-CM | POA: Diagnosis not present

## 2024-06-18 DIAGNOSIS — I5032 Chronic diastolic (congestive) heart failure: Secondary | ICD-10-CM | POA: Diagnosis not present

## 2024-06-18 DIAGNOSIS — N1832 Chronic kidney disease, stage 3b: Secondary | ICD-10-CM | POA: Diagnosis not present

## 2024-06-18 DIAGNOSIS — N184 Chronic kidney disease, stage 4 (severe): Secondary | ICD-10-CM | POA: Diagnosis not present

## 2024-06-18 DIAGNOSIS — E0840 Diabetes mellitus due to underlying condition with diabetic neuropathy, unspecified: Secondary | ICD-10-CM | POA: Diagnosis not present

## 2024-06-21 DIAGNOSIS — N1832 Chronic kidney disease, stage 3b: Secondary | ICD-10-CM | POA: Diagnosis not present

## 2024-06-21 DIAGNOSIS — E0840 Diabetes mellitus due to underlying condition with diabetic neuropathy, unspecified: Secondary | ICD-10-CM | POA: Diagnosis not present

## 2024-06-21 DIAGNOSIS — N184 Chronic kidney disease, stage 4 (severe): Secondary | ICD-10-CM | POA: Diagnosis not present

## 2024-06-21 DIAGNOSIS — I5032 Chronic diastolic (congestive) heart failure: Secondary | ICD-10-CM | POA: Diagnosis not present

## 2024-06-22 DIAGNOSIS — H2513 Age-related nuclear cataract, bilateral: Secondary | ICD-10-CM | POA: Diagnosis not present

## 2024-06-22 DIAGNOSIS — H401131 Primary open-angle glaucoma, bilateral, mild stage: Secondary | ICD-10-CM | POA: Diagnosis not present

## 2024-06-22 DIAGNOSIS — H04123 Dry eye syndrome of bilateral lacrimal glands: Secondary | ICD-10-CM | POA: Diagnosis not present

## 2024-06-29 ENCOUNTER — Other Ambulatory Visit (HOSPITAL_COMMUNITY): Payer: Self-pay

## 2024-06-29 ENCOUNTER — Other Ambulatory Visit (HOSPITAL_BASED_OUTPATIENT_CLINIC_OR_DEPARTMENT_OTHER): Payer: Self-pay

## 2024-06-30 ENCOUNTER — Other Ambulatory Visit (HOSPITAL_COMMUNITY): Payer: Self-pay

## 2024-06-30 DIAGNOSIS — B353 Tinea pedis: Secondary | ICD-10-CM | POA: Diagnosis not present

## 2024-06-30 DIAGNOSIS — L400 Psoriasis vulgaris: Secondary | ICD-10-CM | POA: Diagnosis not present

## 2024-07-06 DIAGNOSIS — L2089 Other atopic dermatitis: Secondary | ICD-10-CM | POA: Diagnosis not present

## 2024-07-06 DIAGNOSIS — L309 Dermatitis, unspecified: Secondary | ICD-10-CM | POA: Diagnosis not present

## 2024-07-06 DIAGNOSIS — L299 Pruritus, unspecified: Secondary | ICD-10-CM | POA: Diagnosis not present

## 2024-07-06 DIAGNOSIS — B353 Tinea pedis: Secondary | ICD-10-CM | POA: Diagnosis not present

## 2024-07-09 ENCOUNTER — Other Ambulatory Visit: Payer: Self-pay

## 2024-07-09 ENCOUNTER — Other Ambulatory Visit (HOSPITAL_COMMUNITY): Payer: Self-pay

## 2024-07-09 MED ORDER — SIMVASTATIN 20 MG PO TABS
20.0000 mg | ORAL_TABLET | Freq: Every evening | ORAL | 3 refills | Status: AC
  Filled 2024-07-09: qty 90, 90d supply, fill #0
  Filled 2024-11-02: qty 90, 90d supply, fill #1

## 2024-07-10 ENCOUNTER — Other Ambulatory Visit (HOSPITAL_COMMUNITY): Payer: Self-pay

## 2024-07-10 ENCOUNTER — Other Ambulatory Visit: Payer: Self-pay

## 2024-07-16 ENCOUNTER — Other Ambulatory Visit (HOSPITAL_COMMUNITY): Payer: Self-pay

## 2024-07-17 ENCOUNTER — Other Ambulatory Visit (HOSPITAL_COMMUNITY): Payer: Self-pay

## 2024-07-17 ENCOUNTER — Other Ambulatory Visit: Payer: Self-pay

## 2024-07-17 MED FILL — Sacubitril-Valsartan Tab 49-51 MG: ORAL | 30 days supply | Qty: 60 | Fill #8 | Status: AC

## 2024-07-19 DIAGNOSIS — E7800 Pure hypercholesterolemia, unspecified: Secondary | ICD-10-CM | POA: Diagnosis not present

## 2024-07-19 DIAGNOSIS — N184 Chronic kidney disease, stage 4 (severe): Secondary | ICD-10-CM | POA: Diagnosis not present

## 2024-07-19 DIAGNOSIS — N1832 Chronic kidney disease, stage 3b: Secondary | ICD-10-CM | POA: Diagnosis not present

## 2024-07-19 DIAGNOSIS — E039 Hypothyroidism, unspecified: Secondary | ICD-10-CM | POA: Diagnosis not present

## 2024-07-21 DIAGNOSIS — I5032 Chronic diastolic (congestive) heart failure: Secondary | ICD-10-CM | POA: Diagnosis not present

## 2024-07-21 DIAGNOSIS — N184 Chronic kidney disease, stage 4 (severe): Secondary | ICD-10-CM | POA: Diagnosis not present

## 2024-07-21 DIAGNOSIS — E0840 Diabetes mellitus due to underlying condition with diabetic neuropathy, unspecified: Secondary | ICD-10-CM | POA: Diagnosis not present

## 2024-07-21 DIAGNOSIS — N1832 Chronic kidney disease, stage 3b: Secondary | ICD-10-CM | POA: Diagnosis not present

## 2024-07-22 ENCOUNTER — Other Ambulatory Visit (HOSPITAL_COMMUNITY): Payer: Self-pay

## 2024-07-22 ENCOUNTER — Other Ambulatory Visit: Payer: Self-pay

## 2024-08-10 ENCOUNTER — Ambulatory Visit: Admitting: Diagnostic Neuroimaging

## 2024-08-10 ENCOUNTER — Encounter: Payer: Self-pay | Admitting: Diagnostic Neuroimaging

## 2024-08-10 VITALS — BP 130/76 | HR 65 | Ht 74.0 in | Wt 282.0 lb

## 2024-08-10 DIAGNOSIS — E1142 Type 2 diabetes mellitus with diabetic polyneuropathy: Secondary | ICD-10-CM | POA: Diagnosis not present

## 2024-08-10 DIAGNOSIS — M48062 Spinal stenosis, lumbar region with neurogenic claudication: Secondary | ICD-10-CM | POA: Diagnosis not present

## 2024-08-10 DIAGNOSIS — Z7985 Long-term (current) use of injectable non-insulin antidiabetic drugs: Secondary | ICD-10-CM

## 2024-08-10 NOTE — Progress Notes (Signed)
 [facility]  PATIENT: Brendan [name]. DOB: 1956/08/27  REFERRING CLINICIAN: Faythe Purchase, MD HISTORY FROM: patient  REASON FOR VISIT: new consult   HISTORICAL  CHIEF COMPLAINT:  Chief Complaint  Patient presents with   Extremity Weakness    RM 6 alone  Pt is well, here for LLE numbness and weakness that has been persistent for about 2 yrs. He also mentions symptoms in both feet.     HISTORY OF PRESENT ILLNESS:   68 year old male here for evaluation of left lower extremity numbness and weakness.  2021 patient had onset of lower back pain rating to the left leg and foot.  Symptoms worsened over time.  Also has had some bilateral numbness and tingling in the toes and feet, related to diabetes.  No problems with fingers or hands.   REVIEW OF SYSTEMS: Full 14 system review of systems performed and negative with exception of: As per HPI.  ALLERGIES: Allergies  Allergen Reactions   Ace Inhibitors Cough   Maxidex [Dexamethasone] Other (See Comments)    Sexual dysfunction   Verapamil Other (See Comments)    constipation    HOME MEDICATIONS: Outpatient Medications Prior to Visit  Medication Sig Dispense Refill   acetaminophen  (TYLENOL ) 500 MG tablet Take 500 mg by mouth every 6 (six) hours as needed for mild pain.     Blood Glucose Monitoring Suppl (ONE TOUCH ULTRA SYSTEM KIT) W/DEVICE KIT 1 kit by Does not apply route once. One touch ultrasoft lancets     carvedilol  (COREG ) 25 MG tablet Take 1 tablet (25 mg total) by mouth 2 (two) times daily with food 180 tablet 2   clobetasol  ointment (TEMOVATE ) 0.05 % Apply topically daily before sleeping. 60 g 1   Continuous Glucose Sensor (FREESTYLE LIBRE 3 PLUS SENSOR) MISC Apply to upper back of arm. Change every 15 days. 2 each 12   Continuous Glucose Sensor (FREESTYLE LIBRE 3 SENSOR) MISC Apply to upper back of arm and change ever 14 days. 6 each 3   dapagliflozin  propanediol (FARXIGA ) 10 MG TABS tablet Take 1  tablet (10 mg total) by mouth daily. 90 tablet 4   HUMALOG  KWIKPEN 200 UNIT/ML KwikPen Inject 10-22 Units into the skin 3 (three) times daily. PLUS 4 extra units if blood sugar over 200 mg/dL     hydrALAZINE  (APRESOLINE ) 100 MG tablet Take 1/2 tablet (50 mg total) by mouth 2 (two) times daily. 90 tablet 3   insulin  glargine (LANTUS  SOLOSTAR) 100 UNIT/ML Solostar Pen Inject 30 Units into the skin daily. Titrate when directed. Max daily dose of 50 units. 45 mL 4   insulin  glargine (LANTUS ) 100 UNIT/ML injection Inject 34 Units into the skin daily.     insulin  lispro (HUMALOG  KWIKPEN) 200 UNIT/ML KwikPen Inject under the skin 12 units at small meals and 24 units at regular meals  3 times a day 75 mL 3   Insulin  Pen Needle (TECHLITE PEN NEEDLES) 32G X 4 MM MISC Use to inject insulin  4 times daily 400 each 4   Insulin  Pen Needle 32G X 4 MM MISC Use to inject insulin  up to 4 times daily. 400 each 4   levothyroxine  (SYNTHROID ) 125 MCG tablet Take 1 tablet (125 mcg total) by mouth every morning on an empty stomach 30 tablet 11   potassium chloride  SA (KLOR-CON  M) 20 MEQ tablet Take 3 tablets (60 mEq total) by mouth daily. 270 tablet 3   sacubitril -valsartan  (ENTRESTO ) 49-51 MG Take 1 tablet by mouth 2 (  two) times daily. 60 tablet 8   simvastatin  (ZOCOR ) 20 MG tablet Take 1 tablet (20 mg total) by mouth every evening. 90 tablet 3   tirzepatide  (MOUNJARO ) 15 MG/0.5ML Pen Inject 15 mg into the skin once a week. 6 mL 4   torsemide  (DEMADEX ) 20 MG tablet Take 3 tablets (60 mg total) by mouth daily. 90 tablet 11   tirzepatide  (MOUNJARO ) 12.5 MG/0.5ML Pen Inject 12.5 mg into the skin once a week. (Patient not taking: Reported on 08/10/2024) 2 mL 11   brimonidine  (ALPHAGAN ) 0.15 % ophthalmic solution INSTILL 1 DROP IN EACH EYE TWICE DAILY. 5 mL 5   carvedilol  (COREG ) 25 MG tablet Take 1 tablet (25 mg) by mouth 2  times daily with food (Patient not taking: Reported on 08/10/2024) 180 tablet 3   Finerenone  (KERENDIA )  20 MG TABS 1 (one) tablet by mouth every morning for kidney and heart health. 90 tablet 3   timolol  (TIMOPTIC ) 0.5 % ophthalmic solution INSTILL 1 DROP IN EACH EYE TWICE DAILY. (Patient not taking: Reported on 08/10/2024) 5 mL 5   tirzepatide  (MOUNJARO ) 10 MG/0.5ML Pen Inject 10 mg into the skin once a week. (Patient not taking: Reported on 08/10/2024) 2 mL 12   No facility-administered medications prior to visit.    PAST MEDICAL HISTORY: Past Medical History:  Diagnosis Date   Acanthosis nigricans    Atopic dermatitis    CHF (congestive heart failure) (HCC)    CKD (chronic kidney disease) stage 4, GFR 15-29 ml/min (HCC) 01/08/2023   Diabetes (HCC) 11/19/2002   Erectile dysfunction    Fatty liver 11/19/2005   Glaucoma 11/19/2006   Gynecomastia 11/20/2007   Hx of adenomatous colonic polyps 08/26/2023   Hyperaldosteronism (HCC) 11/19/1998   Hypercholesterolemia    Hyperlipidemia 2010   Hypertension 1999   Hypoglycemic reaction    Hypothyroidism 11/19/2002   Incontinence    Intermittent vertigo 11/19/2010   Left cervical radiculopathy 11/19/2010   Lumbar radiculopathy    Obesity    Peripheral neuropathy    Pollen allergies 11/19/2005   perennial   Prostate cancer (HCC) 11/19/2004   Reflux esophagitis 11/19/1993   Sickle cell trait (HCC) 11/19/2004   Sleep apnea, obstructive 11/19/1998   uses a cpap   Venous insufficiency 11/20/2003   Vitamin D deficiency 11/19/2010    PAST SURGICAL HISTORY: Past Surgical History:  Procedure Laterality Date   COLONOSCOPY  2006   normal   lap band surgery  2009   left inguinal hernia repair  1998   LIPOMA EXCISION Left 04/17/2023   Procedure: MINOR EXCISION LEFT BUTTOCK SEBACEOUS CYST;  Surgeon: Lyndel Deward PARAS, MD;  Location: [facility];  Service: General;  Laterality: Left;   robotic prostatectomy  2008    FAMILY HISTORY: Family History  Problem Relation Age of Onset   Heart failure Mother    Hypertension  Father        deceased age 48   Heart attack Father    COPD Sister    CVA Sister    Diabetes Daughter    Colon cancer Neg Hx    Stomach cancer Neg Hx    Colon polyps Neg Hx    Esophageal cancer Neg Hx    Rectal cancer Neg Hx     SOCIAL HISTORY: Social History   Socioeconomic History   Marital status: Married    Spouse name: Not on file   Number of children: 3   Years of education: Not on file  Highest education level: Not on file  Occupational History   Occupation: truck driver    Comment: Retired Futures trader  Tobacco Use   Smoking status: Never   Smokeless tobacco: Never  Vaping Use   Vaping status: Never Used  Substance and Sexual Activity   Alcohol use: Yes    Comment: one drink every few months - 1965   Drug use: No   Sexual activity: Yes  Other Topics Concern   Not on file  Social History Narrative   Right handed   Lives in a two story home    Drinks no caffeine    Social Drivers of Corporate investment banker Strain: Low Risk  (01/08/2023)   Overall Financial Resource Strain (CARDIA)    Difficulty of Paying Living Expenses: Not hard at all  Food Insecurity: No Food Insecurity (01/08/2023)   Hunger Vital Sign    Worried About Running Out of Food in the Last Year: Never true    Ran Out of Food in the Last Year: Never true  Transportation Needs: No Transportation Needs (01/08/2023)   PRAPARE - Administrator, Civil Service (Medical): No    Lack of Transportation (Non-Medical): No  Physical Activity: Not on file  Stress: Not on file  Social Connections: Unknown (04/03/2022)   [facility] from [facility]   Social Network    Social Network: Not on file  Intimate Partner Violence: Unknown (02/23/2022)   Received from [facility]   HITS    Physically Hurt: Not on file    Insult or Talk Down To: Not on file    Threaten Physical Harm: Not on file    Scream or Curse: Not on file     PHYSICAL EXAM  GENERAL EXAM/CONSTITUTIONAL: Vitals:   Vitals:   08/10/24 1053  BP: 130/76  Pulse: 65  Weight: 282 lb (127.9 kg)  Height: 6' 2 (1.88 m)   Body mass index is 36.21 kg/m. Wt Readings from Last 3 Encounters:  08/10/24 282 lb (127.9 kg)  03/10/24 295 lb 3.2 oz (133.9 kg)  01/20/24 297 lb 12.8 oz (135.1 kg)   Patient is in no distress; well developed, nourished and groomed; neck is supple  CARDIOVASCULAR: Examination of carotid arteries is normal; no carotid bruits Regular rate and rhythm, no murmurs Examination of peripheral vascular system by observation and palpation is normal  EYES: Ophthalmoscopic exam of optic discs and posterior segments is normal; no papilledema or hemorrhages No results found.  MUSCULOSKELETAL: Gait, strength, tone, movements noted in Neurologic exam below  NEUROLOGIC: MENTAL STATUS:      No data to display         awake, alert, oriented to person, place and time recent and remote memory intact normal attention and concentration language fluent, comprehension intact, naming intact fund of knowledge appropriate  CRANIAL NERVE:  2nd - no papilledema on fundoscopic exam 2nd, 3rd, 4th, 6th - pupils equal and reactive to light, visual fields full to confrontation, extraocular muscles intact, no nystagmus 5th - facial sensation symmetric 7th - facial strength symmetric 8th - hearing intact 9th - palate elevates symmetrically, uvula midline 11th - shoulder shrug symmetric 12th - tongue protrusion midline  MOTOR:  normal bulk and tone, full strength in the BUE BLE --> HIP FLEXION 3-4; KE / KF 5; LEFT FOOT DF 4; RIGHT FOOT DF 5  SENSORY:  normal and symmetric to light touch, pinprick, temperature, vibration; DECR IN LEGS; ABSENT PP AND VIB IN FEET AND  ANKLES; DECR IN LEFT KNEE COMPARED TO RIGHT  COORDINATION:  finger-nose-finger, fine finger movements normal  REFLEXES:  deep tendon reflexes TRACE and symmetric; ABSENT AT ANKLES  GAIT/STATION:  narrow based gait; ANTALGIC  GAIT; USING CANE     DIAGNOSTIC DATA (LABS, IMAGING, TESTING) - I reviewed patient records, labs, notes, testing and imaging myself where available.  Lab Results  Component Value Date   WBC 12.1 (H) 03/10/2024   HGB 12.2 (L) 03/10/2024   HCT 36.5 (L) 03/10/2024   MCV 83.9 03/10/2024   PLT 309 03/10/2024      Component Value Date/Time   NA 136 03/26/2024 1027   NA 140 09/03/2022 0849   K 4.0 03/26/2024 1027   CL 103 03/26/2024 1027   CO2 25 03/26/2024 1027   GLUCOSE 215 (H) 03/26/2024 1027   BUN 35 (H) 03/26/2024 1027   BUN 26 09/03/2022 0849   CREATININE 3.08 (H) 03/26/2024 1027   CALCIUM 8.5 (L) 03/26/2024 1027   PROT 7.6 04/21/2022 0935   ALBUMIN 3.5 04/21/2022 0935   AST 14 (L) 04/21/2022 0935   ALT 13 04/21/2022 0935   ALKPHOS 74 04/21/2022 0935   BILITOT 0.8 04/21/2022 0935   GFRNONAA 21 (L) 03/26/2024 1027   GFRAA 29 (L) 08/25/2020 1144   No results found for: CHOL, HDL, LDLCALC, LDLDIRECT, TRIG, CHOLHDL Lab Results  Component Value Date   HGBA1C 8.6 (H) 01/08/2023   No results found for: VITAMINB12 No results found for: TSH   08/09/20 MRI lumbar spine - Mild congenital spinal canal narrowing, epidural lipomatosis, degenerative disc disease and facet arthrosis in the lower lumbar spine as described above. This is most notable for moderate LEFT lateral recess narrowing and mild to moderate spinal canal narrowing at L3-L4; moderate spinal canal and moderate LEFT neuroforaminal stenosis at L4-L5; moderate LEFT neuroforaminal stenosis at L5-S1.     ASSESSMENT AND PLAN  68 y.o. year old male here with:  Dx:  1. Spinal stenosis of lumbar region with neurogenic claudication   2. Diabetic polyneuropathy associated with type 2 diabetes mellitus (HCC)     PLAN:  NUMBNESS / PAIN (left thigh, left leg / foot pain / numbness + bilateral foot numbness; symptoms since ~ 2021) - suspect left lumbar radiculopathy / spinal stenosis (apparent on MRI  lumbar spine from 2021); check MRI lumbar spine (for worsening spinal stenosis), then PT consult and possible neurosurgery consult (has seen Dr. Gillie in past) - also some component of bilateral distal neuropathy (due to diabetes, obesity, renal disease)  - EMG/NCS not likely to add information at this time re: treatment options - continue diabetes control per endocrinology - consider painful neuropathy treatment options: -duloxetine 30-60mg  daily, amitriptyline 25-50mg  at bedtime, gabapentin 100-300mg  three times a day, pregabalin 75-150mg  twice a day -capsaicin cream, lidocaine  patch / cream, alpha-lipoic acid 600mg  daily  Orders Placed This Encounter  Procedures   MR LUMBAR SPINE WO CONTRAST   Return for pending test results, pending if symptoms worsen or fail to improve.    EDUARD FABIENE HANLON, MD 08/10/2024, 11:49 AM Certified in Neurology, Neurophysiology and Neuroimaging  [facility] [address], [address] [place], [place] [address] [phone]

## 2024-08-10 NOTE — Patient Instructions (Signed)
  NUMBNESS / PAIN (left thigh, left leg / foot pain / numbness + bilateral foot numbness; symptoms since ~ 2021)  - suspect left lumbar radiculopathy / spinal stenosis (apparent on MRI lumbar spine from 2021); check MRI lumbar spine, then PT consult and possible neurosurgery consult (has seen Dr. Gillie in past)  - also some component of bilateral distal neuropathy (due to diabetes, obesity, renal disease)   - continue diabetes control per endocrinology

## 2024-08-11 DIAGNOSIS — L308 Other specified dermatitis: Secondary | ICD-10-CM | POA: Diagnosis not present

## 2024-08-12 DIAGNOSIS — N1832 Chronic kidney disease, stage 3b: Secondary | ICD-10-CM | POA: Diagnosis not present

## 2024-08-12 DIAGNOSIS — E1122 Type 2 diabetes mellitus with diabetic chronic kidney disease: Secondary | ICD-10-CM | POA: Diagnosis not present

## 2024-08-12 DIAGNOSIS — E1165 Type 2 diabetes mellitus with hyperglycemia: Secondary | ICD-10-CM | POA: Diagnosis not present

## 2024-08-12 DIAGNOSIS — E039 Hypothyroidism, unspecified: Secondary | ICD-10-CM | POA: Diagnosis not present

## 2024-08-17 ENCOUNTER — Other Ambulatory Visit (HOSPITAL_COMMUNITY): Payer: Self-pay

## 2024-08-18 DIAGNOSIS — E039 Hypothyroidism, unspecified: Secondary | ICD-10-CM | POA: Diagnosis not present

## 2024-08-18 DIAGNOSIS — E7800 Pure hypercholesterolemia, unspecified: Secondary | ICD-10-CM | POA: Diagnosis not present

## 2024-08-18 DIAGNOSIS — N184 Chronic kidney disease, stage 4 (severe): Secondary | ICD-10-CM | POA: Diagnosis not present

## 2024-08-18 DIAGNOSIS — E0840 Diabetes mellitus due to underlying condition with diabetic neuropathy, unspecified: Secondary | ICD-10-CM | POA: Diagnosis not present

## 2024-08-18 DIAGNOSIS — I5032 Chronic diastolic (congestive) heart failure: Secondary | ICD-10-CM | POA: Diagnosis not present

## 2024-08-18 DIAGNOSIS — N1832 Chronic kidney disease, stage 3b: Secondary | ICD-10-CM | POA: Diagnosis not present

## 2024-08-19 DIAGNOSIS — E109 Type 1 diabetes mellitus without complications: Secondary | ICD-10-CM | POA: Diagnosis not present

## 2024-08-21 ENCOUNTER — Ambulatory Visit
Admission: RE | Admit: 2024-08-21 | Discharge: 2024-08-21 | Disposition: A | Discharge status: Home / Self Care | Source: Ambulatory Visit | Attending: Diagnostic Neuroimaging | Admitting: Diagnostic Neuroimaging

## 2024-08-21 ENCOUNTER — Other Ambulatory Visit (HOSPITAL_COMMUNITY): Payer: Self-pay

## 2024-08-21 DIAGNOSIS — M48062 Spinal stenosis, lumbar region with neurogenic claudication: Secondary | ICD-10-CM | POA: Diagnosis not present

## 2024-08-25 ENCOUNTER — Other Ambulatory Visit (HOSPITAL_COMMUNITY): Payer: Self-pay

## 2024-08-25 ENCOUNTER — Other Ambulatory Visit (HOSPITAL_COMMUNITY): Payer: Self-pay | Admitting: Family Medicine

## 2024-08-25 MED ORDER — SACUBITRIL-VALSARTAN 49-51 MG PO TABS
1.0000 | ORAL_TABLET | Freq: Two times a day (BID) | ORAL | 8 refills | Status: DC
  Filled 2024-08-25: qty 60, 30d supply, fill #0

## 2024-08-26 ENCOUNTER — Other Ambulatory Visit (HOSPITAL_COMMUNITY): Payer: Self-pay

## 2024-08-26 ENCOUNTER — Ambulatory Visit: Payer: Self-pay | Admitting: Diagnostic Neuroimaging

## 2024-08-28 ENCOUNTER — Encounter (HOSPITAL_COMMUNITY)
Admission: EM | Disposition: A | Discharge status: Home Health | Payer: Self-pay | Source: Ambulatory Visit | Attending: Internal Medicine

## 2024-08-28 ENCOUNTER — Emergency Department (HOSPITAL_COMMUNITY): Admitting: Certified Registered Nurse Anesthetist

## 2024-08-28 ENCOUNTER — Emergency Department (HOSPITAL_BASED_OUTPATIENT_CLINIC_OR_DEPARTMENT_OTHER)

## 2024-08-28 ENCOUNTER — Inpatient Hospital Stay (HOSPITAL_BASED_OUTPATIENT_CLINIC_OR_DEPARTMENT_OTHER)
Admission: EM | Admit: 2024-08-28 | Discharge: 2024-09-03 | DRG: 853 | Disposition: A | Discharge status: Home Health | Source: Ambulatory Visit | Attending: Family Medicine | Admitting: Family Medicine

## 2024-08-28 ENCOUNTER — Encounter (HOSPITAL_BASED_OUTPATIENT_CLINIC_OR_DEPARTMENT_OTHER): Payer: Self-pay

## 2024-08-28 ENCOUNTER — Other Ambulatory Visit: Payer: Self-pay

## 2024-08-28 DIAGNOSIS — Z8546 Personal history of malignant neoplasm of prostate: Secondary | ICD-10-CM

## 2024-08-28 DIAGNOSIS — E872 Acidosis, unspecified: Secondary | ICD-10-CM | POA: Diagnosis present

## 2024-08-28 DIAGNOSIS — Z6837 Body mass index (BMI) 37.0-37.9, adult: Secondary | ICD-10-CM

## 2024-08-28 DIAGNOSIS — A419 Sepsis, unspecified organism: Principal | ICD-10-CM

## 2024-08-28 DIAGNOSIS — N184 Chronic kidney disease, stage 4 (severe): Secondary | ICD-10-CM

## 2024-08-28 DIAGNOSIS — I5030 Unspecified diastolic (congestive) heart failure: Secondary | ICD-10-CM | POA: Diagnosis not present

## 2024-08-28 DIAGNOSIS — R6520 Severe sepsis without septic shock: Secondary | ICD-10-CM | POA: Diagnosis present

## 2024-08-28 DIAGNOSIS — R1030 Lower abdominal pain, unspecified: Secondary | ICD-10-CM | POA: Diagnosis not present

## 2024-08-28 DIAGNOSIS — L209 Atopic dermatitis, unspecified: Secondary | ICD-10-CM | POA: Diagnosis present

## 2024-08-28 DIAGNOSIS — Z79899 Other long term (current) drug therapy: Secondary | ICD-10-CM

## 2024-08-28 DIAGNOSIS — Z794 Long term (current) use of insulin: Secondary | ICD-10-CM

## 2024-08-28 DIAGNOSIS — E119 Type 2 diabetes mellitus without complications: Secondary | ICD-10-CM | POA: Diagnosis not present

## 2024-08-28 DIAGNOSIS — Z833 Family history of diabetes mellitus: Secondary | ICD-10-CM

## 2024-08-28 DIAGNOSIS — E1165 Type 2 diabetes mellitus with hyperglycemia: Secondary | ICD-10-CM | POA: Diagnosis not present

## 2024-08-28 DIAGNOSIS — E039 Hypothyroidism, unspecified: Secondary | ICD-10-CM | POA: Diagnosis present

## 2024-08-28 DIAGNOSIS — I5032 Chronic diastolic (congestive) heart failure: Secondary | ICD-10-CM | POA: Diagnosis not present

## 2024-08-28 DIAGNOSIS — I10 Essential (primary) hypertension: Secondary | ICD-10-CM

## 2024-08-28 DIAGNOSIS — E1122 Type 2 diabetes mellitus with diabetic chronic kidney disease: Secondary | ICD-10-CM | POA: Diagnosis present

## 2024-08-28 DIAGNOSIS — G4733 Obstructive sleep apnea (adult) (pediatric): Secondary | ICD-10-CM

## 2024-08-28 DIAGNOSIS — M726 Necrotizing fasciitis: Secondary | ICD-10-CM | POA: Diagnosis not present

## 2024-08-28 DIAGNOSIS — E11649 Type 2 diabetes mellitus with hypoglycemia without coma: Secondary | ICD-10-CM | POA: Diagnosis not present

## 2024-08-28 DIAGNOSIS — Z7985 Long-term (current) use of injectable non-insulin antidiabetic drugs: Secondary | ICD-10-CM | POA: Diagnosis not present

## 2024-08-28 DIAGNOSIS — N179 Acute kidney failure, unspecified: Secondary | ICD-10-CM | POA: Diagnosis present

## 2024-08-28 DIAGNOSIS — N493 Fournier gangrene: Secondary | ICD-10-CM | POA: Diagnosis not present

## 2024-08-28 DIAGNOSIS — Z79890 Hormone replacement therapy (postmenopausal): Secondary | ICD-10-CM | POA: Diagnosis not present

## 2024-08-28 DIAGNOSIS — I5033 Acute on chronic diastolic (congestive) heart failure: Secondary | ICD-10-CM | POA: Diagnosis not present

## 2024-08-28 DIAGNOSIS — R935 Abnormal findings on diagnostic imaging of other abdominal regions, including retroperitoneum: Secondary | ICD-10-CM | POA: Diagnosis not present

## 2024-08-28 DIAGNOSIS — I130 Hypertensive heart and chronic kidney disease with heart failure and stage 1 through stage 4 chronic kidney disease, or unspecified chronic kidney disease: Secondary | ICD-10-CM | POA: Diagnosis present

## 2024-08-28 DIAGNOSIS — L409 Psoriasis, unspecified: Secondary | ICD-10-CM | POA: Diagnosis not present

## 2024-08-28 DIAGNOSIS — L0292 Furuncle, unspecified: Secondary | ICD-10-CM | POA: Diagnosis present

## 2024-08-28 DIAGNOSIS — E1142 Type 2 diabetes mellitus with diabetic polyneuropathy: Secondary | ICD-10-CM | POA: Diagnosis present

## 2024-08-28 DIAGNOSIS — D573 Sickle-cell trait: Secondary | ICD-10-CM | POA: Diagnosis present

## 2024-08-28 DIAGNOSIS — S31501A Unspecified open wound of unspecified external genital organs, male, initial encounter: Secondary | ICD-10-CM | POA: Diagnosis not present

## 2024-08-28 DIAGNOSIS — R609 Edema, unspecified: Secondary | ICD-10-CM | POA: Diagnosis not present

## 2024-08-28 DIAGNOSIS — E7800 Pure hypercholesterolemia, unspecified: Secondary | ICD-10-CM | POA: Diagnosis present

## 2024-08-28 DIAGNOSIS — Z8639 Personal history of other endocrine, nutritional and metabolic disease: Secondary | ICD-10-CM | POA: Diagnosis not present

## 2024-08-28 DIAGNOSIS — B9561 Methicillin susceptible Staphylococcus aureus infection as the cause of diseases classified elsewhere: Secondary | ICD-10-CM | POA: Diagnosis not present

## 2024-08-28 DIAGNOSIS — N5089 Other specified disorders of the male genital organs: Secondary | ICD-10-CM | POA: Diagnosis present

## 2024-08-28 HISTORY — PX: INCISION AND DRAINAGE OF WOUND: SHX1803

## 2024-08-28 LAB — CBC WITH DIFFERENTIAL/PLATELET
Abs Immature Granulocytes: 0.12 K/uL — ABNORMAL HIGH (ref 0.00–0.07)
Basophils Absolute: 0.1 K/uL (ref 0.0–0.1)
Basophils Relative: 0 %
Eosinophils Absolute: 1.2 K/uL — ABNORMAL HIGH (ref 0.0–0.5)
Eosinophils Relative: 6 %
HCT: 34.4 % — ABNORMAL LOW (ref 39.0–52.0)
Hemoglobin: 11.7 g/dL — ABNORMAL LOW (ref 13.0–17.0)
Immature Granulocytes: 1 %
Lymphocytes Relative: 4 %
Lymphs Abs: 1 K/uL (ref 0.7–4.0)
MCH: 28.5 pg (ref 26.0–34.0)
MCHC: 34 g/dL (ref 30.0–36.0)
MCV: 83.7 fL (ref 80.0–100.0)
Monocytes Absolute: 1.8 K/uL — ABNORMAL HIGH (ref 0.1–1.0)
Monocytes Relative: 8 %
Neutro Abs: 18 K/uL — ABNORMAL HIGH (ref 1.7–7.7)
Neutrophils Relative %: 81 %
Platelets: 347 K/uL (ref 150–400)
RBC: 4.11 MIL/uL — ABNORMAL LOW (ref 4.22–5.81)
RDW: 15.1 % (ref 11.5–15.5)
WBC: 22.2 K/uL — ABNORMAL HIGH (ref 4.0–10.5)
nRBC: 0 % (ref 0.0–0.2)

## 2024-08-28 LAB — PROTIME-INR
INR: 1 (ref 0.8–1.2)
Prothrombin Time: 13.9 s (ref 11.4–15.2)

## 2024-08-28 LAB — GLUCOSE, CAPILLARY
Glucose-Capillary: 71 mg/dL (ref 70–99)
Glucose-Capillary: 82 mg/dL (ref 70–99)

## 2024-08-28 LAB — URINALYSIS, W/ REFLEX TO CULTURE (INFECTION SUSPECTED)
Bacteria, UA: NONE SEEN
Bilirubin Urine: NEGATIVE
Glucose, UA: >1000 mg/dL — AB
Ketones, ur: NEGATIVE mg/dL
Leukocytes,Ua: NEGATIVE
Nitrite: NEGATIVE
Protein, ur: 100 mg/dL — AB
Specific Gravity, Urine: 1.011 (ref 1.005–1.030)
pH: 6.5 (ref 5.0–8.0)

## 2024-08-28 LAB — COMPREHENSIVE METABOLIC PANEL WITH GFR
ALT: 17 U/L (ref 0–44)
AST: 16 U/L (ref 15–41)
Albumin: 3.7 g/dL (ref 3.5–5.0)
Alkaline Phosphatase: 98 U/L (ref 38–126)
Anion gap: 14 (ref 5–15)
BUN: 41 mg/dL — ABNORMAL HIGH (ref 8–23)
CO2: 22 mmol/L (ref 22–32)
Calcium: 9.2 mg/dL (ref 8.9–10.3)
Chloride: 99 mmol/L (ref 98–111)
Creatinine, Ser: 3.79 mg/dL — ABNORMAL HIGH (ref 0.61–1.24)
GFR, Estimated: 16 mL/min — ABNORMAL LOW (ref >60)
Glucose, Bld: 177 mg/dL — ABNORMAL HIGH (ref 70–99)
Potassium: 4.5 mmol/L (ref 3.5–5.1)
Sodium: 135 mmol/L (ref 135–145)
Total Bilirubin: 0.5 mg/dL (ref 0.0–1.2)
Total Protein: 7.2 g/dL (ref 6.5–8.1)

## 2024-08-28 LAB — LACTIC ACID, PLASMA
Lactic Acid, Venous: 2.4 mmol/L (ref 0.5–1.9)
Lactic Acid, Venous: 1.4 mmol/L (ref 0.5–1.9)

## 2024-08-28 LAB — PRO BRAIN NATRIURETIC PEPTIDE: Pro Brain Natriuretic Peptide: 6038 pg/mL — ABNORMAL HIGH (ref <300.0)

## 2024-08-28 SURGERY — IRRIGATION AND DEBRIDEMENT WOUND
Anesthesia: General

## 2024-08-28 MED ORDER — SUGAMMADEX SODIUM 200 MG/2ML IV SOLN
INTRAVENOUS | Status: DC | PRN
  Administered 2024-08-28: 140 mg via INTRAVENOUS
  Administered 2024-08-28: 260 mg via INTRAVENOUS

## 2024-08-28 MED ORDER — PHENYLEPHRINE 80 MCG/ML (10ML) SYRINGE FOR IV PUSH (FOR BLOOD PRESSURE SUPPORT)
PREFILLED_SYRINGE | INTRAVENOUS | Status: DC | PRN
  Administered 2024-08-28: 160 ug via INTRAVENOUS

## 2024-08-28 MED ORDER — VASHE WOUND IRRIGATION OPTIME
TOPICAL | Status: DC | PRN
Start: 2024-08-28 — End: 2024-08-28
  Administered 2024-08-28: 34 [oz_av]

## 2024-08-28 MED ORDER — ONDANSETRON HCL 4 MG/2ML IJ SOLN
INTRAMUSCULAR | Status: DC | PRN
  Administered 2024-08-28: 4 mg via INTRAVENOUS

## 2024-08-28 MED ORDER — PHENYLEPHRINE HCL-NACL 20-0.9 MG/250ML-% IV SOLN
INTRAVENOUS | Status: DC | PRN
  Administered 2024-08-28: 20 ug/min via INTRAVENOUS

## 2024-08-28 MED ORDER — LIDOCAINE 2% (20 MG/ML) 5 ML SYRINGE
INTRAMUSCULAR | Status: DC | PRN
  Administered 2024-08-28: 40 mg via INTRAVENOUS

## 2024-08-28 MED ORDER — PHENYLEPHRINE 80 MCG/ML (10ML) SYRINGE FOR IV PUSH (FOR BLOOD PRESSURE SUPPORT)
PREFILLED_SYRINGE | INTRAVENOUS | Status: AC
  Filled 2024-08-28: qty 10

## 2024-08-28 MED ORDER — LACTATED RINGERS IV BOLUS
500.0000 mL | Freq: Once | INTRAVENOUS | Status: AC
  Administered 2024-08-28: 500 mL via INTRAVENOUS

## 2024-08-28 MED ORDER — PROPOFOL 10 MG/ML IV BOLUS
INTRAVENOUS | Status: AC
  Filled 2024-08-28: qty 20

## 2024-08-28 MED ORDER — MIDAZOLAM HCL 2 MG/2ML IJ SOLN
INTRAMUSCULAR | Status: AC
  Filled 2024-08-28: qty 2

## 2024-08-28 MED ORDER — DEXTROSE 50 % IV SOLN
INTRAVENOUS | Status: DC | PRN
  Administered 2024-08-28: 12.5 g via INTRAVENOUS

## 2024-08-28 MED ORDER — SUCCINYLCHOLINE CHLORIDE 200 MG/10ML IV SOSY
PREFILLED_SYRINGE | INTRAVENOUS | Status: AC
Start: 2024-08-28 — End: 2024-08-28
  Filled 2024-08-28: qty 10

## 2024-08-28 MED ORDER — LINEZOLID 600 MG/300ML IV SOLN
600.0000 mg | Freq: Once | INTRAVENOUS | Status: AC
  Administered 2024-08-28: 600 mg via INTRAVENOUS
  Filled 2024-08-28: qty 300

## 2024-08-28 MED ORDER — PIPERACILLIN-TAZOBACTAM 3.375 G IVPB 30 MIN
3.3750 g | Freq: Once | INTRAVENOUS | Status: AC
  Administered 2024-08-28: 3.375 g via INTRAVENOUS
  Filled 2024-08-28: qty 50

## 2024-08-28 MED ORDER — FENTANYL CITRATE (PF) 250 MCG/5ML IJ SOLN
INTRAMUSCULAR | Status: DC | PRN
  Administered 2024-08-28: 50 ug via INTRAVENOUS
  Administered 2024-08-28: 150 ug via INTRAVENOUS
  Administered 2024-08-28: 50 ug via INTRAVENOUS

## 2024-08-28 MED ORDER — ROCURONIUM BROMIDE 10 MG/ML (PF) SYRINGE
PREFILLED_SYRINGE | INTRAVENOUS | Status: DC | PRN
  Administered 2024-08-28: 30 mg via INTRAVENOUS
  Administered 2024-08-28: 20 mg via INTRAVENOUS

## 2024-08-28 MED ORDER — ONDANSETRON HCL 4 MG/2ML IJ SOLN
INTRAMUSCULAR | Status: AC
Start: 2024-08-28 — End: 2024-08-28
  Filled 2024-08-28: qty 2

## 2024-08-28 MED ORDER — ACETAMINOPHEN 10 MG/ML IV SOLN
INTRAVENOUS | Status: AC
  Filled 2024-08-28: qty 100

## 2024-08-28 MED ORDER — FENTANYL CITRATE (PF) 250 MCG/5ML IJ SOLN
INTRAMUSCULAR | Status: AC
  Filled 2024-08-28: qty 5

## 2024-08-28 MED ORDER — MIDAZOLAM HCL 2 MG/2ML IJ SOLN: 0.5000 mg | Freq: Once | INTRAMUSCULAR | Status: DC | PRN

## 2024-08-28 MED ORDER — CEFTRIAXONE SODIUM 1 G IJ SOLR
2.0000 g | Freq: Once | INTRAMUSCULAR | Status: DC
  Filled 2024-08-28: qty 20

## 2024-08-28 MED ORDER — LACTATED RINGERS IV SOLN: INTRAVENOUS | Status: DC

## 2024-08-28 MED ORDER — ACETAMINOPHEN 10 MG/ML IV SOLN
INTRAVENOUS | Status: DC | PRN
Start: 2024-08-28 — End: 2024-08-28
  Administered 2024-08-28: 1000 mg via INTRAVENOUS

## 2024-08-28 MED ORDER — DEXTROSE 50 % IV SOLN
INTRAVENOUS | Status: AC
  Filled 2024-08-28: qty 50

## 2024-08-28 MED ORDER — PROPOFOL 10 MG/ML IV BOLUS
INTRAVENOUS | Status: DC | PRN
  Administered 2024-08-28: 200 mg via INTRAVENOUS

## 2024-08-28 MED ORDER — OXYCODONE HCL 5 MG/5ML PO SOLN: 5.0000 mg | Freq: Once | ORAL | Status: DC | PRN

## 2024-08-28 MED ORDER — 0.9 % SODIUM CHLORIDE (POUR BTL) OPTIME
TOPICAL | Status: DC | PRN
  Administered 2024-08-28: 1000 mL

## 2024-08-28 MED ORDER — MIDAZOLAM HCL 2 MG/2ML IJ SOLN
INTRAMUSCULAR | Status: DC | PRN
  Administered 2024-08-28 (×2): 1 mg via INTRAVENOUS

## 2024-08-28 MED ORDER — SODIUM CHLORIDE 0.9 % IV SOLN
2.0000 g | Freq: Once | INTRAVENOUS | Status: AC
  Administered 2024-08-28: 2 g via INTRAVENOUS
  Filled 2024-08-28: qty 20

## 2024-08-28 MED ORDER — SUCCINYLCHOLINE CHLORIDE 200 MG/10ML IV SOSY
PREFILLED_SYRINGE | INTRAVENOUS | Status: DC | PRN
  Administered 2024-08-28: 200 mg via INTRAVENOUS

## 2024-08-28 MED ORDER — ROCURONIUM BROMIDE 10 MG/ML (PF) SYRINGE
PREFILLED_SYRINGE | INTRAVENOUS | Status: AC
Start: 2024-08-28 — End: 2024-08-28
  Filled 2024-08-28: qty 10

## 2024-08-28 MED ORDER — MEPERIDINE HCL 25 MG/ML IJ SOLN: 6.2500 mg | INTRAMUSCULAR | Status: DC | PRN

## 2024-08-28 MED ORDER — ALBUMIN HUMAN 5 % IV SOLN: INTRAVENOUS | Status: DC | PRN

## 2024-08-28 MED ORDER — EPHEDRINE 5 MG/ML INJ
INTRAVENOUS | Status: AC
  Filled 2024-08-28: qty 5

## 2024-08-28 MED ORDER — OXYCODONE HCL 5 MG PO TABS: 5.0000 mg | ORAL_TABLET | Freq: Once | ORAL | Status: DC | PRN

## 2024-08-28 MED ORDER — EPHEDRINE SULFATE-NACL 50-0.9 MG/10ML-% IV SOSY
PREFILLED_SYRINGE | INTRAVENOUS | Status: DC | PRN
  Administered 2024-08-28 (×2): 5 mg via INTRAVENOUS

## 2024-08-28 MED ORDER — LIDOCAINE 2% (20 MG/ML) 5 ML SYRINGE
INTRAMUSCULAR | Status: AC
  Filled 2024-08-28: qty 5

## 2024-08-28 MED ORDER — CHLORHEXIDINE GLUCONATE CLOTH 2 % EX PADS
6.0000 | MEDICATED_PAD | Freq: Every day | CUTANEOUS | Status: DC
  Administered 2024-08-29 – 2024-09-03 (×6): 6 via TOPICAL

## 2024-08-28 MED ORDER — HYDROMORPHONE HCL 1 MG/ML IJ SOLN: 0.2500 mg | INTRAMUSCULAR | Status: DC | PRN

## 2024-08-28 SURGICAL SUPPLY — 31 items
BAG COUNTER SPONGE SURGICOUNT (BAG) ×1 IMPLANT
BNDG GAUZE DERMACEA FLUFF 4 (GAUZE/BANDAGES/DRESSINGS) IMPLANT
CANISTER SUCTION 3000ML PPV (SUCTIONS) ×1 IMPLANT
CLEANSER WND VASHE 34 (WOUND CARE) IMPLANT
CNTNR URN SCR LID CUP LEK RST (MISCELLANEOUS) IMPLANT
COVER SURGICAL LIGHT HANDLE (MISCELLANEOUS) ×1 IMPLANT
DRAPE LAPAROSCOPIC ABDOMINAL (DRAPES) IMPLANT
ELECT CAUTERY BLADE 6.4 (BLADE) ×1 IMPLANT
ELECTRODE REM PT RTRN 9FT ADLT (ELECTROSURGICAL) ×1 IMPLANT
GAUZE PAD ABD 8X10 STRL (GAUZE/BANDAGES/DRESSINGS) IMPLANT
GAUZE SPONGE 4X4 12PLY STRL (GAUZE/BANDAGES/DRESSINGS) IMPLANT
GLOVE BIO SURGEON STRL SZ7.5 (GLOVE) ×1 IMPLANT
GLOVE BIOGEL PI IND STRL 7.0 (GLOVE) IMPLANT
GLOVE BIOGEL PI IND STRL 8 (GLOVE) ×1 IMPLANT
GLOVE SURG SYN 6.5 PF PI (GLOVE) IMPLANT
GOWN STRL REUS W/ TWL LRG LVL3 (GOWN DISPOSABLE) ×1 IMPLANT
GOWN STRL REUS W/TWL XL LVL3 (GOWN DISPOSABLE) ×1 IMPLANT
GOWN STRL SURGICAL XL XLNG (GOWN DISPOSABLE) IMPLANT
KIT BASIN OR (CUSTOM PROCEDURE TRAY) ×1 IMPLANT
KIT TURNOVER KIT B (KITS) ×1 IMPLANT
PACK GENERAL/GYN (CUSTOM PROCEDURE TRAY) ×1 IMPLANT
PACK LITHOTOMY IV (CUSTOM PROCEDURE TRAY) IMPLANT
PAD ARMBOARD POSITIONER FOAM (MISCELLANEOUS) ×1 IMPLANT
PENCIL SMOKE EVACUATOR (MISCELLANEOUS) IMPLANT
SET HNDPC FAN SPRY TIP SCT (DISPOSABLE) IMPLANT
SOLN 0.9% NACL 1000 ML (IV SOLUTION) ×1 IMPLANT
SOLN 0.9% NACL POUR BTL 1000ML (IV SOLUTION) ×1 IMPLANT
SWAB COLLECTION DEVICE MRSA (MISCELLANEOUS) IMPLANT
SWAB CULTURE ESWAB REG 1ML (MISCELLANEOUS) IMPLANT
TOWEL GREEN STERILE (TOWEL DISPOSABLE) ×1 IMPLANT
TOWEL GREEN STERILE FF (TOWEL DISPOSABLE) ×1 IMPLANT

## 2024-08-28 NOTE — ED Notes (Signed)
 Report called to [place] CRNA. No needs noted VSS

## 2024-08-28 NOTE — H&P (Signed)
 Admitting Physician: Deward PARAS Reichen Hutzler  Service: General Surgery  CC: perineal infection  Subjective   HPI: Brendan [name]. is an 68 y.o. male who is here for a perineal infection.  He had a painful cyst right behind his scrotum that swelled up and drained.  It is very painful.  He deals with psoriasis and wonders if it may be related.  It is very painful.    Past Medical History:  Diagnosis Date   Acanthosis nigricans    Atopic dermatitis    CHF (congestive heart failure) (HCC)    CKD (chronic kidney disease) stage 4, GFR 15-29 ml/min (HCC) 01/08/2023   Diabetes (HCC) 11/19/2002   Erectile dysfunction    Fatty liver 11/19/2005   Glaucoma 11/19/2006   Gynecomastia 11/20/2007   Hx of adenomatous colonic polyps 08/26/2023   Hyperaldosteronism 11/19/1998   Hypercholesterolemia    Hyperlipidemia 2010   Hypertension 1999   Hypoglycemic reaction    Hypothyroidism 11/19/2002   Incontinence    Intermittent vertigo 11/19/2010   Left cervical radiculopathy 11/19/2010   Lumbar radiculopathy    Obesity    Peripheral neuropathy    Pollen allergies 11/19/2005   perennial   Prostate cancer (HCC) 11/19/2004   Reflux esophagitis 11/19/1993   Sickle cell trait 11/19/2004   Sleep apnea, obstructive 11/19/1998   uses a cpap   Venous insufficiency 11/20/2003   Vitamin D deficiency 11/19/2010    Past Surgical History:  Procedure Laterality Date   COLONOSCOPY  2006   normal   lap band surgery  2009   left inguinal hernia repair  1998   LIPOMA EXCISION Left 04/17/2023   Procedure: MINOR EXCISION LEFT BUTTOCK SEBACEOUS CYST;  Surgeon: Lyndel Deward PARAS, MD;  Location: [facility];  Service: General;  Laterality: Left;   robotic prostatectomy  2008    Family History  Problem Relation Age of Onset   Heart failure Mother    Hypertension Father        deceased age 71   Heart attack Father    COPD Sister    CVA Sister    Diabetes Daughter    Colon cancer Neg  Hx    Stomach cancer Neg Hx    Colon polyps Neg Hx    Esophageal cancer Neg Hx    Rectal cancer Neg Hx     Social:  reports that he has never smoked. He has never used smokeless tobacco. He reports current alcohol use. He reports that he does not use drugs.  Allergies:  Allergies  Allergen Reactions   Ace Inhibitors Cough   Maxidex [Dexamethasone] Other (See Comments)    Sexual dysfunction   Verapamil Other (See Comments)    constipation    Medications: Current Outpatient Medications  Medication Instructions   acetaminophen  (TYLENOL ) 500 mg, Every 6 hours PRN   Blood Glucose Monitoring Suppl (ONE TOUCH ULTRA SYSTEM KIT) W/DEVICE KIT 1 kit,  Once   carvedilol  (COREG ) 25 MG tablet Take 1 tablet (25 mg total) by mouth 2 (two) times daily with food   clobetasol  ointment (TEMOVATE ) 0.05 % Apply topically daily before sleeping.   Continuous Glucose Sensor (FREESTYLE LIBRE 3 PLUS SENSOR) MISC Apply to upper back of arm. Change every 15 days.   Continuous Glucose Sensor (FREESTYLE LIBRE 3 SENSOR) MISC Apply to upper back of arm and change ever 14 days.   Farxiga  10 mg, Oral, Daily   HumaLOG  KwikPen 10-22 Units, 3 times daily   hydrALAZINE  (APRESOLINE ) 100  MG tablet Take 1/2 tablet (50 mg total) by mouth 2 (two) times daily.   insulin  glargine (LANTUS ) 34 Units, Daily   insulin  lispro (HUMALOG  KWIKPEN) 200 UNIT/ML KwikPen Inject under the skin 12 units at small meals and 24 units at regular meals  3 times a day   Insulin  Pen Needle (TECHLITE PEN NEEDLES) 32G X 4 MM MISC Use to inject insulin  4 times daily   Insulin  Pen Needle 32G X 4 MM MISC Use to inject insulin  up to 4 times daily.   Lantus  SoloStar 30 Units, Subcutaneous, Daily, Titrate when directed. Max daily dose of 50 units.   levothyroxine  (SYNTHROID ) 125 MCG tablet Take 1 tablet (125 mcg total) by mouth every morning on an empty stomach   Mounjaro  12.5 mg, Subcutaneous, Weekly   Mounjaro  15 mg, Subcutaneous, Weekly   potassium  chloride SA (KLOR-CON  M) 20 MEQ tablet 60 mEq, Oral, Daily   sacubitril -valsartan  (ENTRESTO ) 49-51 MG 1 tablet, Oral, 2 times daily   simvastatin  (ZOCOR ) 20 mg, Oral, Every evening   torsemide  (DEMADEX ) 60 mg, Oral, Daily    ROS - all of the below systems have been reviewed with the patient and positives are indicated with bold text General: chills, fever or night sweats Eyes: blurry vision or double vision ENT: epistaxis or sore throat Allergy/Immunology: itchy/watery eyes or nasal congestion Hematologic/Lymphatic: bleeding problems, blood clots or swollen lymph nodes Endocrine: temperature intolerance or unexpected weight changes Breast: new or changing breast lumps or nipple discharge Resp: cough, shortness of breath, or wheezing CV: chest pain or dyspnea on exertion GI: as per HPI GU: dysuria, trouble voiding, or hematuria MSK: joint pain or joint stiffness Neuro: TIA or stroke symptoms Derm: pruritus and skin lesion changes Psych: anxiety and depression  Objective   PE Blood pressure (!) 165/82, pulse 72, temperature 98.4 F (36.9 C), temperature source Oral, resp. rate 14, height 6' 2 (1.88 m), weight 129.3 kg, SpO2 100%. Constitutional: NAD; conversant; no deformities Eyes: Moist conjunctiva; no lid lag; anicteric; PERRL Neck: Trachea midline; no thyromegaly Lungs: Normal respiratory effort; no tactile fremitus CV: RRR; no palpable thrills; no pitting edema GI: Abd Soft, nontender; no palpable hepatosplenomegaly MSK: Normal range of motion of extremities; no clubbing/cyanosis Psychiatric: Appropriate affect; alert and oriented x3 Lymphatic: No palpable cervical or axillary lymphadenopathy  Perineal - tender induration posterior to scrotum with small head with minimal drainage  Results for orders placed or performed during the hospital encounter of 08/28/24 (from the past 24 hours)  Urinalysis, w/ Reflex to Culture (Infection Suspected) -Urine, Clean Catch     Status:  Abnormal   Collection Time: 08/28/24  4:03 PM  Result Value Ref Range   Specimen Source URINE, CATHETERIZED    Color, Urine COLORLESS (A) YELLOW   APPearance CLEAR CLEAR   Specific Gravity, Urine 1.011 1.005 - 1.030   pH 6.5 5.0 - 8.0   Glucose, UA >1,000 (A) NEGATIVE mg/dL   Hgb urine dipstick TRACE (A) NEGATIVE   Bilirubin Urine NEGATIVE NEGATIVE   Ketones, ur NEGATIVE NEGATIVE mg/dL   Protein, ur 899 (A) NEGATIVE mg/dL   Nitrite NEGATIVE NEGATIVE   Leukocytes,Ua NEGATIVE NEGATIVE   RBC / HPF 0-5 0 - 5 RBC/hpf   WBC, UA 0-5 0 - 5 WBC/hpf   Bacteria, UA NONE SEEN NONE SEEN   Squamous Epithelial / HPF 0-5 0 - 5 /HPF  Comprehensive metabolic panel     Status: Abnormal   Collection Time: 08/28/24  4:13 PM  Result  Value Ref Range   Sodium 135 135 - 145 mmol/L   Potassium 4.5 3.5 - 5.1 mmol/L   Chloride 99 98 - 111 mmol/L   CO2 22 22 - 32 mmol/L   Glucose, Bld 177 (H) 70 - 99 mg/dL   BUN 41 (H) 8 - 23 mg/dL   Creatinine, Ser 6.20 (H) 0.61 - 1.24 mg/dL   Calcium 9.2 8.9 - 89.6 mg/dL   Total Protein 7.2 6.5 - 8.1 g/dL   Albumin 3.7 3.5 - 5.0 g/dL   AST 16 15 - 41 U/L   ALT 17 0 - 44 U/L   Alkaline Phosphatase 98 38 - 126 U/L   Total Bilirubin 0.5 0.0 - 1.2 mg/dL   GFR, Estimated 16 (L) >60 mL/min   Anion gap 14 5 - 15  Lactic acid, plasma     Status: Abnormal   Collection Time: 08/28/24  4:13 PM  Result Value Ref Range   Lactic Acid, Venous 2.4 (HH) 0.5 - 1.9 mmol/L  CBC with Differential     Status: Abnormal   Collection Time: 08/28/24  4:13 PM  Result Value Ref Range   WBC 22.2 (H) 4.0 - 10.5 K/uL   RBC 4.11 (L) 4.22 - 5.81 MIL/uL   Hemoglobin 11.7 (L) 13.0 - 17.0 g/dL   HCT 65.5 (L) 60.9 - 47.9 %   MCV 83.7 80.0 - 100.0 fL   MCH 28.5 26.0 - 34.0 pg   MCHC 34.0 30.0 - 36.0 g/dL   RDW 84.8 88.4 - 84.4 %   Platelets 347 150 - 400 K/uL   nRBC 0.0 0.0 - 0.2 %   Neutrophils Relative % 81 %   Neutro Abs 18.0 (H) 1.7 - 7.7 K/uL   Lymphocytes Relative 4 %   Lymphs Abs  1.0 0.7 - 4.0 K/uL   Monocytes Relative 8 %   Monocytes Absolute 1.8 (H) 0.1 - 1.0 K/uL   Eosinophils Relative 6 %   Eosinophils Absolute 1.2 (H) 0.0 - 0.5 K/uL   Basophils Relative 0 %   Basophils Absolute 0.1 0.0 - 0.1 K/uL   Immature Granulocytes 1 %   Abs Immature Granulocytes 0.12 (H) 0.00 - 0.07 K/uL  Protime-INR     Status: None   Collection Time: 08/28/24  4:13 PM  Result Value Ref Range   Prothrombin Time 13.9 11.4 - 15.2 seconds   INR 1.0 0.8 - 1.2  Pro Brain natriuretic peptide     Status: Abnormal   Collection Time: 08/28/24  4:13 PM  Result Value Ref Range   Pro Brain Natriuretic Peptide 6,038.0 (H) <300.0 pg/mL  Culture, blood (Routine x 2)     Status: None (Preliminary result)   Collection Time: 08/28/24  4:15 PM   Specimen: BLOOD LEFT FOREARM  Result Value Ref Range   Specimen Description      BLOOD LEFT FOREARM Performed at [facility], [address], [place], [place] [address]    Special Requests      BOTTLES DRAWN AEROBIC AND ANAEROBIC Blood Culture adequate volume Performed at [facility], [address] [place], [place] [address]    Culture PENDING    Report Status PENDING   Culture, blood (Routine x 2)     Status: None (Preliminary result)   Collection Time: 08/28/24  4:16 PM   Specimen: BLOOD RIGHT FOREARM  Result Value Ref Range   Specimen Description      BLOOD RIGHT FOREARM Performed at [facility],  [address], [place], [place] [address]    Special Requests      BOTTLES DRAWN AEROBIC AND ANAEROBIC Blood Culture adequate volume Performed at [facility], [address] [place], [place] [address]    Culture PENDING    Report Status PENDING   Lactic acid, plasma     Status: None   Collection Time: 08/28/24  6:22 PM  Result Value Ref Range   Lactic Acid, Venous 1.4 0.5 - 1.9 mmol/L     Imaging Orders         CT PELVIS WO CONTRAST     Edema/swelling throughout the scrotal wall.  Gas noted in the scrotum and lower perineum concerning for necrotizing fasciitis.   These results were called by telephone at the time of interpretation on 08/28/2024 at 6:03 pm to provider WHITNEY PLUNKETT , who verbally acknowledged these results.  Assessment and Plan   Jaisen Wiltrout. is an 68 y.o. male with a perineal infection, concerning for a necrotizing soft tissue infection.  I recommended excisional debridement of perineal infection.  We discussed the procedure, its risks, benefits and alternatives and the patient granted consent to proceed.  Will discuss admission to the ICU with the critical care team to help manage this infection.  Discussed case with urology who kindly evaluated the patient and will be available if needed based on intraoperative findings.    Deward JINNY Foy, MD  [facility] Use [email or web address] to contact on call provider  New Patient Billing: [phone] - High MDM

## 2024-08-28 NOTE — Anesthesia Procedure Notes (Signed)
 Procedure Name: Intubation Date/Time: 08/28/2024 9:52 PM  Performed by: Roddie Grate, CRNAPre-anesthesia Checklist: Patient identified, Emergency Drugs available, Suction available, Patient being monitored and Timeout performed Patient Re-evaluated:Patient Re-evaluated prior to induction Oxygen Delivery Method: Circle system utilized Preoxygenation: Pre-oxygenation with 100% oxygen Induction Type: IV induction, Rapid sequence and Cricoid Pressure applied Laryngoscope Size: Mac and 4 Grade View: Grade I Tube type: Oral Tube size: 7.5 mm Number of attempts: 1 Airway Equipment and Method: Stylet Placement Confirmation: ETT inserted through vocal cords under direct vision, positive ETCO2 and breath sounds checked- equal and bilateral Secured at: 24 cm Tube secured with: Tape Dental Injury: Teeth and Oropharynx as per pre-operative assessment  Comments: Smooth IV Induction. Eyes taped. RSI Performed. DL x 1 with grade 1 view. Atraumatically placed, teeth and lip remain intact as pre-op. Secured with tape. Bilateral breath sounds +/=, EtCO2 +, Adequate TV, VSS.

## 2024-08-28 NOTE — ED Notes (Signed)
 Taryn with cl called for ed to ed cone transfer, Dr Deward Collier

## 2024-08-28 NOTE — Consult Note (Addendum)
 I have seen and examined the patient and agree with the above assessment and plan.  Pt did not require GU involvement at initial debridement last night.  Will continue to follow initially and will be willing to be involved if more extensive GU debridement is needed.  He does have history of prostate cancer s/p RALP in 2012 and has been lost to follow up.  Will recheck PSA for surveillance purposes.    Urology Consult   Reason for consult: Fournier's  History of Present Illness: Brendan [name]. is a 68 y.o. unhealthy gentleman with hx of of CKD stage 4, DM, HLD, HTN, neuropathy, PCa (dx in 2006), OSA who presented with draining cyst.  Initially presented to OSH where imaging was concerning for necrotizing soft tissue infection in his perineal region. Labs notable for WBC elevated to 22 and Cr 3.8 (BL ~3). At presentation, he was AF and HDS. Lactate initially 2.4, now down to 1.4. UA not concerning for infection.   On evaluation, patient remains non toxic appearing. Exam notable for tenderness along perineal region. Testicles appear uninvolved and scrotum largely normal appearing.   Previously seen by Dr. Renda who performed RALP in 2012.   Past Medical History:  Diagnosis Date   Acanthosis nigricans    Atopic dermatitis    CHF (congestive heart failure) (HCC)    CKD (chronic kidney disease) stage 4, GFR 15-29 ml/min (HCC) 01/08/2023   Diabetes (HCC) 11/19/2002   Erectile dysfunction    Fatty liver 11/19/2005   Glaucoma 11/19/2006   Gynecomastia 11/20/2007   Hx of adenomatous colonic polyps 08/26/2023   Hyperaldosteronism 11/19/1998   Hypercholesterolemia    Hyperlipidemia 2010   Hypertension 1999   Hypoglycemic reaction    Hypothyroidism 11/19/2002   Incontinence    Intermittent vertigo 11/19/2010   Left cervical radiculopathy 11/19/2010   Lumbar radiculopathy    Obesity    Peripheral neuropathy    Pollen allergies 11/19/2005   perennial   Prostate cancer (HCC) 11/19/2004    Reflux esophagitis 11/19/1993   Sickle cell trait 11/19/2004   Sleep apnea, obstructive 11/19/1998   uses a cpap   Venous insufficiency 11/20/2003   Vitamin D deficiency 11/19/2010    Past Surgical History:  Procedure Laterality Date   COLONOSCOPY  2006   normal   lap band surgery  2009   left inguinal hernia repair  1998   LIPOMA EXCISION Left 04/17/2023   Procedure: MINOR EXCISION LEFT BUTTOCK SEBACEOUS CYST;  Surgeon: Lyndel Deward PARAS, MD;  Location: [facility];  Service: General;  Laterality: Left;   robotic prostatectomy  2008    Current Hospital Medications:  Home Meds:  No current facility-administered medications on file prior to encounter.   Current Outpatient Medications on File Prior to Encounter  Medication Sig Dispense Refill   acetaminophen  (TYLENOL ) 500 MG tablet Take 500 mg by mouth every 6 (six) hours as needed for mild pain.     Blood Glucose Monitoring Suppl (ONE TOUCH ULTRA SYSTEM KIT) W/DEVICE KIT 1 kit by Does not apply route once. One touch ultrasoft lancets     carvedilol  (COREG ) 25 MG tablet Take 1 tablet (25 mg total) by mouth 2 (two) times daily with food 180 tablet 2   clobetasol  ointment (TEMOVATE ) 0.05 % Apply topically daily before sleeping. 60 g 1   Continuous Glucose Sensor (FREESTYLE LIBRE 3 PLUS SENSOR) MISC Apply to upper back of arm. Change every 15 days. 2 each 12   Continuous Glucose Sensor (  FREESTYLE LIBRE 3 SENSOR) MISC Apply to upper back of arm and change ever 14 days. 6 each 3   dapagliflozin  propanediol (FARXIGA ) 10 MG TABS tablet Take 1 tablet (10 mg total) by mouth daily. 90 tablet 4   HUMALOG  KWIKPEN 200 UNIT/ML KwikPen Inject 10-22 Units into the skin 3 (three) times daily. PLUS 4 extra units if blood sugar over 200 mg/dL     hydrALAZINE  (APRESOLINE ) 100 MG tablet Take 1/2 tablet (50 mg total) by mouth 2 (two) times daily. 90 tablet 3   insulin  glargine (LANTUS  SOLOSTAR) 100 UNIT/ML Solostar Pen Inject 30 Units  into the skin daily. Titrate when directed. Max daily dose of 50 units. 45 mL 4   insulin  glargine (LANTUS ) 100 UNIT/ML injection Inject 34 Units into the skin daily.     insulin  lispro (HUMALOG  KWIKPEN) 200 UNIT/ML KwikPen Inject under the skin 12 units at small meals and 24 units at regular meals  3 times a day 75 mL 3   Insulin  Pen Needle (TECHLITE PEN NEEDLES) 32G X 4 MM MISC Use to inject insulin  4 times daily 400 each 4   Insulin  Pen Needle 32G X 4 MM MISC Use to inject insulin  up to 4 times daily. 400 each 4   levothyroxine  (SYNTHROID ) 125 MCG tablet Take 1 tablet (125 mcg total) by mouth every morning on an empty stomach 30 tablet 11   potassium chloride  SA (KLOR-CON  M) 20 MEQ tablet Take 3 tablets (60 mEq total) by mouth daily. 270 tablet 3   sacubitril -valsartan  (ENTRESTO ) 49-51 MG Take 1 tablet by mouth 2 (two) times daily. 60 tablet 8   simvastatin  (ZOCOR ) 20 MG tablet Take 1 tablet (20 mg total) by mouth every evening. 90 tablet 3   tirzepatide  (MOUNJARO ) 12.5 MG/0.5ML Pen Inject 12.5 mg into the skin once a week. (Patient not taking: Reported on 08/10/2024) 2 mL 11   tirzepatide  (MOUNJARO ) 15 MG/0.5ML Pen Inject 15 mg into the skin once a week. 6 mL 4   torsemide  (DEMADEX ) 20 MG tablet Take 3 tablets (60 mg total) by mouth daily. 90 tablet 11     Scheduled Meds: Continuous Infusions:  lactated ringers 40 mL/hr at 08/28/24 2040   PRN Meds:.  Allergies:  Allergies  Allergen Reactions   Ace Inhibitors Cough   Maxidex [Dexamethasone] Other (See Comments)    Sexual dysfunction   Verapamil Other (See Comments)    constipation    Family History  Problem Relation Age of Onset   Heart failure Mother    Hypertension Father        deceased age 71   Heart attack Father    COPD Sister    CVA Sister    Diabetes Daughter    Colon cancer Neg Hx    Stomach cancer Neg Hx    Colon polyps Neg Hx    Esophageal cancer Neg Hx    Rectal cancer Neg Hx     Social History:  reports  that he has never smoked. He has never used smokeless tobacco. He reports current alcohol use. He reports that he does not use drugs.  ROS: A complete review of systems was performed.  All systems are negative except for pertinent findings as noted.  Physical Exam:  Vital signs in last 24 hours: Temp:  [98.1 F (36.7 C)-98.4 F (36.9 C)] 98.2 F (36.8 C) (10/10 2045) Pulse Rate:  [71-77] 77 (10/10 2045) Resp:  [11-22] 16 (10/10 2045) BP: (165-185)/(77-94) 174/77 (10/10 2045) SpO2:  [  97 %-100 %] 100 % (10/10 2045) Weight:  [129.3 kg] 129.3 kg (10/10 1539) Constitutional:  Alert and oriented, No acute distress Cardiovascular: Regular rate  Respiratory: Normal respiratory effort GI: Abdomen is soft, nontender, nondistended GU: Foreskin edematous but still retractable. Significantly tender to palpation along perineal region and lower portion of scrotum. Bilateral testicles palpable and away from region of concern.  Neurologic: Grossly intact, no focal deficits Psychiatric: Normal mood and affect  Laboratory Data:  Recent Labs    08/28/24 1613  WBC 22.2*  HGB 11.7*  HCT 34.4*  PLT 347    Recent Labs    08/28/24 1613  NA 135  K 4.5  CL 99  GLUCOSE 177*  BUN 41*  CALCIUM 9.2  CREATININE 3.79*     Results for orders placed or performed during the hospital encounter of 08/28/24 (from the past 24 hours)  Urinalysis, w/ Reflex to Culture (Infection Suspected) -Urine, Clean Catch     Status: Abnormal   Collection Time: 08/28/24  4:03 PM  Result Value Ref Range   Specimen Source URINE, CATHETERIZED    Color, Urine COLORLESS (A) YELLOW   APPearance CLEAR CLEAR   Specific Gravity, Urine 1.011 1.005 - 1.030   pH 6.5 5.0 - 8.0   Glucose, UA >1,000 (A) NEGATIVE mg/dL   Hgb urine dipstick TRACE (A) NEGATIVE   Bilirubin Urine NEGATIVE NEGATIVE   Ketones, ur NEGATIVE NEGATIVE mg/dL   Protein, ur 899 (A) NEGATIVE mg/dL   Nitrite NEGATIVE NEGATIVE   Leukocytes,Ua NEGATIVE  NEGATIVE   RBC / HPF 0-5 0 - 5 RBC/hpf   WBC, UA 0-5 0 - 5 WBC/hpf   Bacteria, UA NONE SEEN NONE SEEN   Squamous Epithelial / HPF 0-5 0 - 5 /HPF  Comprehensive metabolic panel     Status: Abnormal   Collection Time: 08/28/24  4:13 PM  Result Value Ref Range   Sodium 135 135 - 145 mmol/L   Potassium 4.5 3.5 - 5.1 mmol/L   Chloride 99 98 - 111 mmol/L   CO2 22 22 - 32 mmol/L   Glucose, Bld 177 (H) 70 - 99 mg/dL   BUN 41 (H) 8 - 23 mg/dL   Creatinine, Ser 6.20 (H) 0.61 - 1.24 mg/dL   Calcium 9.2 8.9 - 89.6 mg/dL   Total Protein 7.2 6.5 - 8.1 g/dL   Albumin 3.7 3.5 - 5.0 g/dL   AST 16 15 - 41 U/L   ALT 17 0 - 44 U/L   Alkaline Phosphatase 98 38 - 126 U/L   Total Bilirubin 0.5 0.0 - 1.2 mg/dL   GFR, Estimated 16 (L) >60 mL/min   Anion gap 14 5 - 15  Lactic acid, plasma     Status: Abnormal   Collection Time: 08/28/24  4:13 PM  Result Value Ref Range   Lactic Acid, Venous 2.4 (HH) 0.5 - 1.9 mmol/L  CBC with Differential     Status: Abnormal   Collection Time: 08/28/24  4:13 PM  Result Value Ref Range   WBC 22.2 (H) 4.0 - 10.5 K/uL   RBC 4.11 (L) 4.22 - 5.81 MIL/uL   Hemoglobin 11.7 (L) 13.0 - 17.0 g/dL   HCT 65.5 (L) 60.9 - 47.9 %   MCV 83.7 80.0 - 100.0 fL   MCH 28.5 26.0 - 34.0 pg   MCHC 34.0 30.0 - 36.0 g/dL   RDW 84.8 88.4 - 84.4 %   Platelets 347 150 - 400 K/uL   nRBC 0.0 0.0 -  0.2 %   Neutrophils Relative % 81 %   Neutro Abs 18.0 (H) 1.7 - 7.7 K/uL   Lymphocytes Relative 4 %   Lymphs Abs 1.0 0.7 - 4.0 K/uL   Monocytes Relative 8 %   Monocytes Absolute 1.8 (H) 0.1 - 1.0 K/uL   Eosinophils Relative 6 %   Eosinophils Absolute 1.2 (H) 0.0 - 0.5 K/uL   Basophils Relative 0 %   Basophils Absolute 0.1 0.0 - 0.1 K/uL   Immature Granulocytes 1 %   Abs Immature Granulocytes 0.12 (H) 0.00 - 0.07 K/uL  Protime-INR     Status: None   Collection Time: 08/28/24  4:13 PM  Result Value Ref Range   Prothrombin Time 13.9 11.4 - 15.2 seconds   INR 1.0 0.8 - 1.2  Pro Brain  natriuretic peptide     Status: Abnormal   Collection Time: 08/28/24  4:13 PM  Result Value Ref Range   Pro Brain Natriuretic Peptide 6,038.0 (H) <300.0 pg/mL  Culture, blood (Routine x 2)     Status: None (Preliminary result)   Collection Time: 08/28/24  4:15 PM   Specimen: BLOOD LEFT FOREARM  Result Value Ref Range   Specimen Description      BLOOD LEFT FOREARM Performed at [facility], [address], [place], [place] [address]    Special Requests      BOTTLES DRAWN AEROBIC AND ANAEROBIC Blood Culture adequate volume Performed at [facility], [address], [place], [place] [address]    Culture PENDING    Report Status PENDING   Culture, blood (Routine x 2)     Status: None (Preliminary result)   Collection Time: 08/28/24  4:16 PM   Specimen: BLOOD RIGHT FOREARM  Result Value Ref Range   Specimen Description      BLOOD RIGHT FOREARM Performed at [facility], [address] [place], [place] [address]    Special Requests      BOTTLES DRAWN AEROBIC AND ANAEROBIC Blood Culture adequate volume Performed at [facility], [address] [place], [place] [address]    Culture PENDING    Report Status PENDING   Lactic acid, plasma     Status: None   Collection Time: 08/28/24  6:22 PM  Result Value Ref Range   Lactic Acid, Venous 1.4 0.5 - 1.9 mmol/L   Recent Results (from the past 240 hours)  Culture, blood (Routine x 2)     Status: None (Preliminary result)   Collection Time: 08/28/24  4:15 PM   Specimen: BLOOD LEFT FOREARM  Result Value Ref Range Status   Specimen Description   Final    BLOOD LEFT FOREARM Performed at [facility], [address] [place], [place] [address]    Special Requests   Final    BOTTLES DRAWN AEROBIC AND ANAEROBIC Blood Culture adequate volume Performed at [facility], [address] [place], [place] [address]    Culture PENDING  Incomplete   Report Status  PENDING  Incomplete  Culture, blood (Routine x 2)     Status: None (Preliminary result)   Collection Time: 08/28/24  4:16 PM   Specimen: BLOOD RIGHT FOREARM  Result Value Ref Range Status   Specimen Description   Final    BLOOD RIGHT FOREARM Performed at [facility], [address] [place], [place] [address]    Special Requests   Final    BOTTLES DRAWN AEROBIC AND ANAEROBIC Blood Culture adequate  volume Performed at [facility], [address] [place], [place] [address]    Culture PENDING  Incomplete   Report Status PENDING  Incomplete    Renal Function: Recent Labs    08/28/24 1613  CREATININE 3.79*   Estimated Creatinine Clearance: 26.3 mL/min (A) (by C-G formula based on SCr of 3.79 mg/dL (H)).  Radiologic Imaging: CT PELVIS WO CONTRAST Result Date: 08/28/2024 CLINICAL DATA:  Soft tissue infection suspected, perenial abscess EXAM: CT PELVIS WITHOUT CONTRAST TECHNIQUE: Multidetector CT imaging of the pelvis was performed following the standard protocol without intravenous contrast. RADIATION DOSE REDUCTION: This exam was performed according to the departmental dose-optimization program which includes automated exposure control, adjustment of the mA and/or kV according to patient size and/or use of iterative reconstruction technique. COMPARISON:  None Available. FINDINGS: Urinary Tract:  No abnormality visualized. Bowel:  Unremarkable visualized pelvic bowel loops. Vascular/Lymphatic: No pathologically enlarged lymph nodes. No significant vascular abnormality seen. Small bilateral inguinal lymph nodes, none pathologically enlarged. Reproductive: Scrotal wall edema noted. There is gas within the scrotum and lower perineum concerning for infection with gas-forming organism/necrotizing fasciitis. Other:  No free fluid.  Small umbilical hernia containing fat. Musculoskeletal: No acute bony abnormality. IMPRESSION: Edema/swelling throughout the scrotal wall. Gas  noted in the scrotum and lower perineum concerning for necrotizing fasciitis. These results were called by telephone at the time of interpretation on 08/28/2024 at 6:03 pm to provider WHITNEY PLUNKETT , who verbally acknowledged these results. Electronically Signed   By: Franky Crease M.D.   On: 08/28/2024 18:04   I independently reviewed the above imaging studies.  Impression/Recommendation 21M with concern for necrotizing soft tissue infection with possible involvement of scrotum. Currently non toxic appearing. Plan for general surgery to take-back for debridement, we will be available as needed.   Brooke N Spratte 08/28/2024, 9:06 PM

## 2024-08-28 NOTE — ED Notes (Signed)
 CRITICAL VALUE STICKER  CRITICAL VALUE: Lactic Acid  RECEIVER (on-site recipient of call): Rocky Jobs, RN  DATE & TIME NOTIFIED: [phone]  MESSENGER (representative from lab): JJ  MD NOTIFIED: Yes  TIME OF NOTIFICATION:1705  RESPONSE:  MD notified

## 2024-08-28 NOTE — Sepsis Progress Note (Signed)
 Code sepsis protocol being monitored by eLink.

## 2024-08-28 NOTE — ED Provider Notes (Signed)
 [place] EMERGENCY DEPARTMENT AT [facility] Provider Note   CSN: [id number] Arrival date & time: 08/28/24  8466     Patient presents with: Groin Swelling and Wound Infection   Brendan [name]. is a 68 y.o. male.   Patient is a 68 year old male with a history of diabetes, hypothyroidism, sleep apnea, CKD stage IV, CHF with preserved EF who is presenting today with several complaints.  Patient reports for the last 1 week he has had a boil in his perineal area behind his testicles that yesterday started draining.  It is painful but he is not having any trouble having bowel movements.  No rectal involvement.  Starting yesterday he also noted significant scrotal and penile swelling.  He reports because the swelling is worsening he is having a little trouble urinating but denies any fevers.  He reports he has been a little bit cold but has not had any abdominal pain, nausea or vomiting.  No diarrhea.  He thinks the issue may be related to his skin condition that he has been dealing with over the last 6 to 8 months.  He reports he seen dermatology and they told him to use Aquaphor.  And notes unable to see dermatology's notes but has a history of atopic dermatitis.  He denies any recent medication changes.  The history is provided by the patient and medical records.       Prior to Admission medications   Medication Sig Start Date End Date Taking? Authorizing Provider  acetaminophen  (TYLENOL ) 500 MG tablet Take 500 mg by mouth every 6 (six) hours as needed for mild pain.    [provider]  Blood Glucose Monitoring Suppl (ONE TOUCH ULTRA SYSTEM KIT) W/DEVICE KIT 1 kit by Does not apply route once. One touch ultrasoft lancets    [provider]  carvedilol  (COREG ) 25 MG tablet Take 1 tablet (25 mg total) by mouth 2 (two) times daily with food 04/01/24     clobetasol  ointment (TEMOVATE ) 0.05 % Apply topically daily before sleeping. 05/19/24     Continuous Glucose Sensor  (FREESTYLE LIBRE 3 PLUS SENSOR) MISC Apply to upper back of arm. Change every 15 days. 04/03/24     Continuous Glucose Sensor (FREESTYLE LIBRE 3 SENSOR) MISC Apply to upper back of arm and change ever 14 days. 01/28/24   Faythe Purchase, MD  dapagliflozin  propanediol (FARXIGA ) 10 MG TABS tablet Take 1 tablet (10 mg total) by mouth daily. 04/14/24     HUMALOG  KWIKPEN 200 UNIT/ML KwikPen Inject 10-22 Units into the skin 3 (three) times daily. PLUS 4 extra units if blood sugar over 200 mg/dL 2/75/76   [provider]  hydrALAZINE  (APRESOLINE ) 100 MG tablet Take 1/2 tablet (50 mg total) by mouth 2 (two) times daily. 08/30/23   Clegg, Amy D, NP  insulin  glargine (LANTUS  SOLOSTAR) 100 UNIT/ML Solostar Pen Inject 30 Units into the skin daily. Titrate when directed. Max daily dose of 50 units. 04/14/24     insulin  glargine (LANTUS ) 100 UNIT/ML injection Inject 34 Units into the skin daily.    [provider]  insulin  lispro (HUMALOG  KWIKPEN) 200 UNIT/ML KwikPen Inject under the skin 12 units at small meals and 24 units at regular meals  3 times a day 08/08/23     Insulin  Pen Needle (TECHLITE PEN NEEDLES) 32G X 4 MM MISC Use to inject insulin  4 times daily 10/25/22   Faythe Purchase, MD  Insulin  Pen Needle 32G X 4 MM MISC Use to inject  insulin  up to 4 times daily. 12/26/23   Faythe Purchase, MD  levothyroxine  (SYNTHROID ) 125 MCG tablet Take 1 tablet (125 mcg total) by mouth every morning on an empty stomach 06/08/24     potassium chloride  SA (KLOR-CON  M) 20 MEQ tablet Take 3 tablets (60 mEq total) by mouth daily. 03/10/24   Milford, Harlene HERO, FNP  sacubitril -valsartan  (ENTRESTO ) 49-51 MG Take 1 tablet by mouth 2 (two) times daily. 08/25/24   Bensimhon, Toribio SAUNDERS, MD  simvastatin  (ZOCOR ) 20 MG tablet Take 1 tablet (20 mg total) by mouth every evening. 07/09/24     tirzepatide  (MOUNJARO ) 12.5 MG/0.5ML Pen Inject 12.5 mg into the skin once a week. Patient not taking: Reported on 08/10/2024 03/02/24     tirzepatide   (MOUNJARO ) 15 MG/0.5ML Pen Inject 15 mg into the skin once a week. 04/14/24     torsemide  (DEMADEX ) 20 MG tablet Take 3 tablets (60 mg total) by mouth daily. 08/08/23 08/10/24  Glena Harlene HERO, FNP    Allergies: Ace inhibitors, Maxidex [dexamethasone], and Verapamil    Review of Systems  Updated Vital Signs BP (!) 167/85   Pulse 72   Temp 98.1 F (36.7 C) (Oral)   Resp 11   Ht 6' 2 (1.88 m)   Wt 129.3 kg   SpO2 98%   BMI 36.59 kg/m   Physical Exam Vitals and nursing note reviewed.  Constitutional:      General: He is not in acute distress.    Appearance: He is well-developed.  HENT:     Head: Normocephalic and atraumatic.     Mouth/Throat:     Mouth: Mucous membranes are moist.  Eyes:     Conjunctiva/sclera: Conjunctivae normal.     Pupils: Pupils are equal, round, and reactive to light.  Cardiovascular:     Rate and Rhythm: Normal rate and regular rhythm.     Heart sounds: No murmur heard. Pulmonary:     Effort: Pulmonary effort is normal. No respiratory distress.     Breath sounds: Normal breath sounds. No wheezing or rales.  Abdominal:     General: There is no distension.     Palpations: Abdomen is soft.     Tenderness: There is no abdominal tenderness. There is no guarding or rebound.  Genitourinary:     Comments: Significant edema in the scrotum and penis.  No tenderness with palpation of the testicles.  No crepitus Musculoskeletal:        General: No tenderness. Normal range of motion.     Cervical back: Normal range of motion and neck supple.     Right lower leg: Edema present.     Left lower leg: Edema present.  Skin:    General: Skin is warm and dry.     Findings: Rash present. No erythema.     Comments: Dry scaly rash confluence over the lower extremities from the groin down but also present on the palms and soles and on the upper extremities and torso as well.  No open draining lesions or crusting noted  Neurological:     Mental Status: He is alert  and oriented to person, place, and time. Mental status is at baseline.  Psychiatric:        Behavior: Behavior normal.     (all labs ordered are listed, but only abnormal results are displayed) Labs Reviewed  COMPREHENSIVE METABOLIC PANEL WITH GFR - Abnormal; Notable for the following components:      Result Value   Glucose, Bld 177 (*)  BUN 41 (*)    Creatinine, Ser 3.79 (*)    GFR, Estimated 16 (*)    All other components within normal limits  LACTIC ACID, PLASMA - Abnormal; Notable for the following components:   Lactic Acid, Venous 2.4 (*)    All other components within normal limits  CBC WITH DIFFERENTIAL/PLATELET - Abnormal; Notable for the following components:   WBC 22.2 (*)    RBC 4.11 (*)    Hemoglobin 11.7 (*)    HCT 34.4 (*)    Neutro Abs 18.0 (*)    Monocytes Absolute 1.8 (*)    Eosinophils Absolute 1.2 (*)    Abs Immature Granulocytes 0.12 (*)    All other components within normal limits  PRO BRAIN NATRIURETIC PEPTIDE - Abnormal; Notable for the following components:   Pro Brain Natriuretic Peptide 6,038.0 (*)    All other components within normal limits  CULTURE, BLOOD (ROUTINE X 2)  CULTURE, BLOOD (ROUTINE X 2)  PROTIME-INR  LACTIC ACID, PLASMA  URINALYSIS, W/ REFLEX TO CULTURE (INFECTION SUSPECTED)    EKG: EKG Interpretation Date/Time:  Friday August 28 2024 16:36:27 EDT Ventricular Rate:  72 PR Interval:  174 QRS Duration:  91 QT Interval:  399 QTC Calculation: 437 R Axis:   -33  Text Interpretation: Sinus rhythm Inferior infarct, old No significant change since last tracing Confirmed by Doretha Folks ([id number]) on 08/28/2024 4:46:36 PM  Radiology: CT PELVIS WO CONTRAST Result Date: 08/28/2024 CLINICAL DATA:  Soft tissue infection suspected, perenial abscess EXAM: CT PELVIS WITHOUT CONTRAST TECHNIQUE: Multidetector CT imaging of the pelvis was performed following the standard protocol without intravenous contrast. RADIATION DOSE REDUCTION:  This exam was performed according to the departmental dose-optimization program which includes automated exposure control, adjustment of the mA and/or kV according to patient size and/or use of iterative reconstruction technique. COMPARISON:  None Available. FINDINGS: Urinary Tract:  No abnormality visualized. Bowel:  Unremarkable visualized pelvic bowel loops. Vascular/Lymphatic: No pathologically enlarged lymph nodes. No significant vascular abnormality seen. Small bilateral inguinal lymph nodes, none pathologically enlarged. Reproductive: Scrotal wall edema noted. There is gas within the scrotum and lower perineum concerning for infection with gas-forming organism/necrotizing fasciitis. Other:  No free fluid.  Small umbilical hernia containing fat. Musculoskeletal: No acute bony abnormality. IMPRESSION: Edema/swelling throughout the scrotal wall. Gas noted in the scrotum and lower perineum concerning for necrotizing fasciitis. These results were called by telephone at the time of interpretation on 08/28/2024 at 6:03 pm to provider Goodwin Kamphaus , who verbally acknowledged these results. Electronically Signed   By: Franky Crease M.D.   On: 08/28/2024 18:04     Procedures   Medications Ordered in the ED  lactated ringers infusion (has no administration in time range)  cefTRIAXone (ROCEPHIN) 2 g in sodium chloride  0.9 % 100 mL IVPB (2 g Intravenous New Bag/Given 08/28/24 1747)  piperacillin-tazobactam (ZOSYN) IVPB 3.375 g (has no administration in time range)  linezolid (ZYVOX) IVPB 600 mg (has no administration in time range)  lactated ringers bolus 500 mL (500 mLs Intravenous New Bag/Given 08/28/24 1747)                                    Medical Decision Making Amount and/or Complexity of Data Reviewed Labs: ordered. Decision-making details documented in ED Course. Radiology: ordered and independent interpretation performed. Decision-making details documented in ED Course. ECG/medicine  tests: ordered and independent interpretation performed. Decision-making details  documented in ED Course.  Risk Prescription drug management.   Pt with multiple medical problems and comorbidities and presenting today with a complaint that caries a high risk for morbidity and mortality.  Here today with the above complaint.  Patient does have a small abscess in his perineal area without any rectal involvement.  Does not appear to involve his scrotum but he does have significant swelling of the scrotum and penis.  Patient does have a history of CHF and does have distal edema and concerned the swelling may be related to that and not related to the small abscess that is draining.  Will check labs.  Patient is not complaining of any sepsis-like symptoms at this time.  Will image the area to ensure no deeper infection.  Patient is a diabetic and given location of this area as well as penile and scrotal involvement will do CT to ensure no evidence of Fournier's. I independently interpreted patient's labs and CMP with a creatinine of 3.79 from 3.08 prior, lactic acid elevated at 2.4, leukocytosis today of 22 with a stable hemoglobin, INR is normal.  I have independently visualized and interpreted pt's images today.  Initially before imaging returned with patient's white count and concern for infection he was started with Rocephin.  However CT returned and there is gas present.  Discussed with radiology and they report there is edema and swelling throughout the scrotal wall with gas noted in the scrotum and lower perineum concerning for necrotizing fasciitis.  Patient's antibiotic coverage was broadened with linezolid and Zosyn.  Will consult general surgery.  Patient remains hemodynamically stable at this time. Spoke with DR. Stetschulte with general surgery and pt will be transferred ED to ED CRITICAL CARE Performed by: Dayanne Yiu Total critical care time: 30 minutes Critical care time was exclusive of  separately billable procedures and treating other patients. Critical care was necessary to treat or prevent imminent or life-threatening deterioration. Critical care was time spent personally by me on the following activities: development of treatment plan with patient and/or surrogate as well as nursing, discussions with consultants, evaluation of patient's response to treatment, examination of patient, obtaining history from patient or surrogate, ordering and performing treatments and interventions, ordering and review of laboratory studies, ordering and review of radiographic studies, pulse oximetry and re-evaluation of patient's condition.      Final diagnoses:  Necrotizing fasciitis of scrotum and perineum (HCC)  AKI (acute kidney injury)  Sepsis with acute renal failure without septic shock, due to unspecified organism, unspecified acute renal failure type [facility])    ED Discharge Orders     None          Doretha Folks, MD 08/28/24 1858

## 2024-08-28 NOTE — ED Notes (Signed)
 PT transfer from [facility] ED. PT was accepted by Surgical team, surgical team called. PT A&OX4. VSS and no needs noted. No meds given en route. PT on monitor.

## 2024-08-28 NOTE — Anesthesia Preprocedure Evaluation (Addendum)
 Anesthesia Evaluation  Patient identified by MRN, date of birth, ID band Patient awake    Reviewed: Allergy & Precautions, NPO status , Patient's Chart, lab work & pertinent test results, reviewed documented beta blocker date and time   History of Anesthesia Complications Negative for: history of anesthetic complications  Airway Mallampati: II  TM Distance: >3 FB Neck ROM: Full    Dental  (+) Dental Advisory Given   Pulmonary sleep apnea and Continuous Positive Airway Pressure Ventilation    breath sounds clear to auscultation       Cardiovascular hypertension, Pt. on medications and Pt. on home beta blockers +CHF (Entresto )   Rhythm:Regular Rate:Normal  04/2024 ECHO: EF 60 to 65%. 1. The LV has normal function, no regional wall motion abnormalities. There is mild concentric LVH. Grade I diastolic dysfunction (impaired relaxation).   2. RVF is normal. The right ventricular size is normal.   3. Left atrial size was mildly dilated.   4. Right atrial size was mildly dilated.   5. The mitral valve is normal in structure. Trivial mitral valve regurgitation. No evidence of mitral stenosis.   6. The aortic valve is tricuspid. There is mild calcification of the aortic valve. Aortic valve regurgitation is not visualized. Aortic valve sclerosis/calcification is present, without any evidence of aortic stenosis.     Neuro/Psych negative neurological ROS     GI/Hepatic negative GI ROS, Neg liver ROS,,,  Endo/Other  diabetes (glu 64), Insulin  Dependent, Oral Hypoglycemic AgentsHypothyroidism  Class 3 obesityMounjaro BMI 37  Renal/GU Renal Insufficiency and CRFRenal disease (creat 3.79, K+ 4.5)     Musculoskeletal   Abdominal   Peds  Hematology Hb 11.7, plt 347k   Anesthesia Other Findings   Reproductive/Obstetrics                              Anesthesia Physical Anesthesia Plan  ASA: 3 and  emergent  Anesthesia Plan: General   Post-op Pain Management: Ofirmev  IV (intra-op)*   Induction: Intravenous and Rapid sequence  PONV Risk Score and Plan: 2 and Ondansetron  and Dexamethasone  Airway Management Planned: Oral ETT  Additional Equipment:   Intra-op Plan:   Post-operative Plan: Possible Post-op intubation/ventilation  Informed Consent: I have reviewed the patients History and Physical, chart, labs and discussed the procedure including the risks, benefits and alternatives for the proposed anesthesia with the patient or authorized representative who has indicated his/her understanding and acceptance.     Dental advisory given  Plan Discussed with: CRNA and Surgeon  Anesthesia Plan Comments:          Anesthesia Quick Evaluation

## 2024-08-28 NOTE — Progress Notes (Addendum)
 eLink Physician-Brief Progress Note Patient Name: Moo Gravley. DOB: [birth year] MRN: [id number]   Date of Service  08/28/2024  HPI/Events of Note  68 year old with a history of heart failure, CKD, diabetes, obesity, metabolic syndrome who initially presented with perineal infection initially started as a painful cyst behind his scrotum found to have a necrotizing skin infection of the perineum and scrotum admitted to the ICU postoperatively.  Vital signs are within normal limits on examination saturating 99% on room air.  Results consistent with elevated creatinine, elevated BNP, leukocytosis, resolving lactic acidosis.  Cultures pending.  Preoperative CT reviewed.  eICU Interventions  Maintain broad-spectrum antibiotics-vancomycin, cefepime, Flagyl.  Antitoxin abx not continued per primary team.  Anticipate return to the OR within 2 to 3 days for reevaluation  Continue crystalloid infusion  Tight glycemic control  DVT prophylaxis with heparin  GI prophylaxis not indicated   0443 - Mag sulfate  Intervention Category Evaluation Type: New Patient Evaluation  Keiyana Stehr 08/28/2024, 11:58 PM

## 2024-08-28 NOTE — ED Notes (Signed)
Blood cultures drawn before antibotics

## 2024-08-28 NOTE — Consult Note (Signed)
 NAME:  Brendan [name]., MRN:  [id number], DOB:  11-May-1956, LOS: 0 ADMISSION DATE:  08/28/2024, CONSULTATION DATE:  08/29/2024 REFERRING MD:  Deward Foy, MD, CHIEF COMPLAINT:  groin pain and swelling  History of Present Illness:  68 y/o male with PMH DMT2, HTN, Hypothyroid, OSA, Obesity, CKD stage IV, HFpEF who c/o groin pain and swelling.  He had a boil behind his testicles which started draining 10/9.  He also noted scrotal and penile swelling.  The patient has been seeing a Dermatologist for Atopic Dermatitis.  WBC 22, Cr 3.8, LA 2.4-->1.4. Patient on CT abd was noted to have scrotal wall edema noted. There is gas within the scrotum and lower perineum concerning for infection with gas-forming organism/necrotizing fasciitis. He was taken to OR for debridement.  Patient presented to ICU extubated and not on pressors. Pertinent  Medical History  DMT2, HTN, Hypothyroid, OSA, CKD stage IV, HFpEF, obesity  Significant Hospital Events: Including procedures, antibiotic start and stop dates in addition to other pertinent events   10/11: admit to ICU  Interim History / Subjective:  N/a  Objective    Blood pressure 120/75, pulse 67, temperature 98 F (36.7 C), resp. rate 10, height 6' 2 (1.88 m), weight 129.3 kg, SpO2 100%.        Intake/Output Summary (Last 24 hours) at 08/28/2024 2354 Last data filed at 08/28/2024 2244 Gross per 24 hour  Intake 1801.08 ml  Output 10 ml  Net 1791.08 ml   Filed Weights   08/28/24 1539  Weight: 129.3 kg    Examination: General: awake and alert c/o pain at surgical site HENT: PERRLA no icterus Lungs: CTA no wheezes no rales Cardiovascular: reg s1s2 no murmurs Abdomen: soft obese NT ND bs pos Extremities: no edema cyanosis, clubbing Neuro: AAO x 3, CN II to XII grossly intact GU: post op bandages, clean and dry  Resolved problem list   Assessment and Plan  Necrotizing Fasciitis posterior scrotum s/p surgical debridement Post op day  0 Broad spectrum antibiotics Surgical f/u DMT2 FS and SSI Sepsis with Lactic Acidosis Broad spectrum antibiotics Clearing LA Currently not on pressors CKD stage IV Monitor serum CR Monitor Is/O2 HFpEF IVFs use judiciously as BNP 6k in setting of CKD HTN BP management Start po home meds in a.m., currently BP controlled Pain management as well will help with BP Hypothyroidism Check TSH Resume Synthroid  OSA Patient will use his home CPAP   Labs   CBC: Recent Labs  Lab 08/28/24 1613  WBC 22.2*  NEUTROABS 18.0*  HGB 11.7*  HCT 34.4*  MCV 83.7  PLT 347    Basic Metabolic Panel: Recent Labs  Lab 08/28/24 1613  NA 135  K 4.5  CL 99  CO2 22  GLUCOSE 177*  BUN 41*  CREATININE 3.79*  CALCIUM 9.2   GFR: Estimated Creatinine Clearance: 26.3 mL/min (A) (by C-G formula based on SCr of 3.79 mg/dL (H)). Recent Labs  Lab 08/28/24 1613 08/28/24 1822  WBC 22.2*  --   LATICACIDVEN 2.4* 1.4    Liver Function Tests: Recent Labs  Lab 08/28/24 1613  AST 16  ALT 17  ALKPHOS 98  BILITOT 0.5  PROT 7.2  ALBUMIN 3.7   No results for input(s): LIPASE, AMYLASE in the last 168 hours. No results for input(s): AMMONIA in the last 168 hours.  ABG    Component Value Date/Time   TCO2 28 02/21/2009 2157     Coagulation Profile: Recent Labs  Lab 08/28/24 1613  INR 1.0    Cardiac Enzymes: No results for input(s): CKTOTAL, CKMB, CKMBINDEX, TROPONINI in the last 168 hours.  HbA1C: Hgb A1c MFr Bld  Date/Time Value Ref Range Status  01/08/2023 01:29 AM 8.6 (H) 4.8 - 5.6 % Final    Comment:    REPEATED TO VERIFY (NOTE) Pre diabetes:          5.7%-6.4%  Diabetes:              >6.4%  Glycemic control for   <7.0% adults with diabetes   03/26/2020 06:37 AM 8.4 (H) 4.8 - 5.6 % Final    Comment:    (NOTE) Pre diabetes:          5.7%-6.4% Diabetes:              >6.4% Glycemic control for   <7.0% adults with diabetes     CBG: Recent Labs   Lab 08/28/24 2248  GLUCAP 82    Review of Systems:   No SOB or chest pain, positive surgical site pain  Past Medical History:  He,  has a past medical history of Acanthosis nigricans, Atopic dermatitis, CHF (congestive heart failure) (HCC), CKD (chronic kidney disease) stage 4, GFR 15-29 ml/min (HCC) (01/08/2023), Diabetes (HCC) (11/19/2002), Erectile dysfunction, Fatty liver (11/19/2005), Glaucoma (11/19/2006), Gynecomastia (11/20/2007), adenomatous colonic polyps (08/26/2023), Hyperaldosteronism (11/19/1998), Hypercholesterolemia, Hyperlipidemia (2010), Hypertension (1999), Hypoglycemic reaction, Hypothyroidism (11/19/2002), Incontinence, Intermittent vertigo (11/19/2010), Left cervical radiculopathy (11/19/2010), Lumbar radiculopathy, Obesity, Peripheral neuropathy, Pollen allergies (11/19/2005), Prostate cancer (HCC) (11/19/2004), Reflux esophagitis (11/19/1993), Sickle cell trait (11/19/2004), Sleep apnea, obstructive (11/19/1998), Venous insufficiency (11/20/2003), and Vitamin D deficiency (11/19/2010).   Surgical History:   Past Surgical History:  Procedure Laterality Date   COLONOSCOPY  2006   normal   lap band surgery  2009   left inguinal hernia repair  1998   LIPOMA EXCISION Left 04/17/2023   Procedure: MINOR EXCISION LEFT BUTTOCK SEBACEOUS CYST;  Surgeon: Lyndel Deward PARAS, MD;  Location: [facility];  Service: General;  Laterality: Left;   robotic prostatectomy  2008     Social History:   reports that he has never smoked. He has never used smokeless tobacco. He reports current alcohol use. He reports that he does not use drugs.   Family History:  His family history includes COPD in his sister; CVA in his sister; Diabetes in his daughter; Heart attack in his father; Heart failure in his mother; Hypertension in his father. There is no history of Colon cancer, Stomach cancer, Colon polyps, Esophageal cancer, or Rectal cancer.   Allergies Allergies  Allergen  Reactions   Ace Inhibitors Cough   Maxidex [Dexamethasone] Other (See Comments)    Sexual dysfunction   Verapamil Other (See Comments)    constipation     Home Medications  Prior to Admission medications   Medication Sig Start Date End Date Taking? Authorizing Provider  acetaminophen  (TYLENOL ) 500 MG tablet Take 500 mg by mouth every 6 (six) hours as needed for mild pain.    [provider]  Blood Glucose Monitoring Suppl (ONE TOUCH ULTRA SYSTEM KIT) W/DEVICE KIT 1 kit by Does not apply route once. One touch ultrasoft lancets    [provider]  carvedilol  (COREG ) 25 MG tablet Take 1 tablet (25 mg total) by mouth 2 (two) times daily with food 04/01/24     clobetasol  ointment (TEMOVATE ) 0.05 % Apply topically daily before sleeping. 05/19/24     Continuous Glucose Sensor (FREESTYLE LIBRE 3 PLUS SENSOR) MISC  Apply to upper back of arm. Change every 15 days. 04/03/24     Continuous Glucose Sensor (FREESTYLE LIBRE 3 SENSOR) MISC Apply to upper back of arm and change ever 14 days. 01/28/24   Faythe Purchase, MD  dapagliflozin  propanediol (FARXIGA ) 10 MG TABS tablet Take 1 tablet (10 mg total) by mouth daily. 04/14/24     HUMALOG  KWIKPEN 200 UNIT/ML KwikPen Inject 10-22 Units into the skin 3 (three) times daily. PLUS 4 extra units if blood sugar over 200 mg/dL 2/75/76   [provider]  hydrALAZINE  (APRESOLINE ) 100 MG tablet Take 1/2 tablet (50 mg total) by mouth 2 (two) times daily. 08/30/23   Clegg, Amy D, NP  insulin  glargine (LANTUS  SOLOSTAR) 100 UNIT/ML Solostar Pen Inject 30 Units into the skin daily. Titrate when directed. Max daily dose of 50 units. 04/14/24     insulin  glargine (LANTUS ) 100 UNIT/ML injection Inject 34 Units into the skin daily.    [provider]  insulin  lispro (HUMALOG  KWIKPEN) 200 UNIT/ML KwikPen Inject under the skin 12 units at small meals and 24 units at regular meals  3 times a day 08/08/23     Insulin  Pen Needle (TECHLITE PEN NEEDLES) 32G X  4 MM MISC Use to inject insulin  4 times daily 10/25/22   Faythe Purchase, MD  Insulin  Pen Needle 32G X 4 MM MISC Use to inject insulin  up to 4 times daily. 12/26/23   Faythe Purchase, MD  levothyroxine  (SYNTHROID ) 125 MCG tablet Take 1 tablet (125 mcg total) by mouth every morning on an empty stomach 06/08/24     potassium chloride  SA (KLOR-CON  M) 20 MEQ tablet Take 3 tablets (60 mEq total) by mouth daily. 03/10/24   Milford, Harlene HERO, FNP  sacubitril -valsartan  (ENTRESTO ) 49-51 MG Take 1 tablet by mouth 2 (two) times daily. 08/25/24   Bensimhon, Toribio SAUNDERS, MD  simvastatin  (ZOCOR ) 20 MG tablet Take 1 tablet (20 mg total) by mouth every evening. 07/09/24     tirzepatide  (MOUNJARO ) 12.5 MG/0.5ML Pen Inject 12.5 mg into the skin once a week. Patient not taking: Reported on 08/10/2024 03/02/24     tirzepatide  (MOUNJARO ) 15 MG/0.5ML Pen Inject 15 mg into the skin once a week. 04/14/24     torsemide  (DEMADEX ) 20 MG tablet Take 3 tablets (60 mg total) by mouth daily. 08/08/23 08/10/24  Glena Harlene HERO, FNP     Critical care time: 12   The patient is critically ill with multiple organ system failure and requires high complexity decision making for assessment and support, frequent evaluation and titration of therapies, advanced monitoring, review of radiographic studies and interpretation of complex data.   Critical Care Time devoted to patient care services, exclusive of separately billable procedures, described in this note is 33 minutes.   Orlin Fairly, MD [facility] See Amion for pager  If no response to pager , please call [phone] until 7pm After 7:00 pm call Elink  [phone] 08/29/2024, 12:21 AM

## 2024-08-28 NOTE — Consult Note (Incomplete)
 NAME:  Brendan [name]., MRN:  [id number], DOB:  1956-03-10, LOS: 0 ADMISSION DATE:  08/28/2024, CONSULTATION DATE:  08/29/2024 REFERRING MD:  Deward Foy, MD, CHIEF COMPLAINT:  groin pain  History of Present Illness:    Pertinent  Medical History  ***  Significant Hospital Events: Including procedures, antibiotic start and stop dates in addition to other pertinent events     Interim History / Subjective:  ***  Objective    Blood pressure 120/75, pulse 67, temperature 98 F (36.7 C), resp. rate 10, height 6' 2 (1.88 m), weight 129.3 kg, SpO2 100%.        Intake/Output Summary (Last 24 hours) at 08/28/2024 2354 Last data filed at 08/28/2024 2244 Gross per 24 hour  Intake 1801.08 ml  Output 10 ml  Net 1791.08 ml   Filed Weights   08/28/24 1539  Weight: 129.3 kg    Examination: General: *** HENT: *** Lungs: *** Cardiovascular: *** Abdomen: *** Extremities: *** Neuro: *** GU: ***  Resolved problem list   Assessment and Plan     Labs   CBC: Recent Labs  Lab 08/28/24 1613  WBC 22.2*  NEUTROABS 18.0*  HGB 11.7*  HCT 34.4*  MCV 83.7  PLT 347    Basic Metabolic Panel: Recent Labs  Lab 08/28/24 1613  NA 135  K 4.5  CL 99  CO2 22  GLUCOSE 177*  BUN 41*  CREATININE 3.79*  CALCIUM 9.2   GFR: Estimated Creatinine Clearance: 26.3 mL/min (A) (by C-G formula based on SCr of 3.79 mg/dL (H)). Recent Labs  Lab 08/28/24 1613 08/28/24 1822  WBC 22.2*  --   LATICACIDVEN 2.4* 1.4    Liver Function Tests: Recent Labs  Lab 08/28/24 1613  AST 16  ALT 17  ALKPHOS 98  BILITOT 0.5  PROT 7.2  ALBUMIN 3.7   No results for input(s): LIPASE, AMYLASE in the last 168 hours. No results for input(s): AMMONIA in the last 168 hours.  ABG    Component Value Date/Time   TCO2 28 02/21/2009 2157     Coagulation Profile: Recent Labs  Lab 08/28/24 1613  INR 1.0    Cardiac Enzymes: No results for input(s): CKTOTAL, CKMB,  CKMBINDEX, TROPONINI in the last 168 hours.  HbA1C: Hgb A1c MFr Bld  Date/Time Value Ref Range Status  01/08/2023 01:29 AM 8.6 (H) 4.8 - 5.6 % Final    Comment:    REPEATED TO VERIFY (NOTE) Pre diabetes:          5.7%-6.4%  Diabetes:              >6.4%  Glycemic control for   <7.0% adults with diabetes   03/26/2020 06:37 AM 8.4 (H) 4.8 - 5.6 % Final    Comment:    (NOTE) Pre diabetes:          5.7%-6.4% Diabetes:              >6.4% Glycemic control for   <7.0% adults with diabetes     CBG: Recent Labs  Lab 08/28/24 2248  GLUCAP 82    Review of Systems:   ***  Past Medical History:  He,  has a past medical history of Acanthosis nigricans, Atopic dermatitis, CHF (congestive heart failure) (HCC), CKD (chronic kidney disease) stage 4, GFR 15-29 ml/min (HCC) (01/08/2023), Diabetes (HCC) (11/19/2002), Erectile dysfunction, Fatty liver (11/19/2005), Glaucoma (11/19/2006), Gynecomastia (11/20/2007), adenomatous colonic polyps (08/26/2023), Hyperaldosteronism (11/19/1998), Hypercholesterolemia, Hyperlipidemia (2010), Hypertension (1999), Hypoglycemic reaction, Hypothyroidism (11/19/2002), Incontinence, Intermittent vertigo (  11/19/2010), Left cervical radiculopathy (11/19/2010), Lumbar radiculopathy, Obesity, Peripheral neuropathy, Pollen allergies (11/19/2005), Prostate cancer (HCC) (11/19/2004), Reflux esophagitis (11/19/1993), Sickle cell trait (11/19/2004), Sleep apnea, obstructive (11/19/1998), Venous insufficiency (11/20/2003), and Vitamin D deficiency (11/19/2010).   Surgical History:   Past Surgical History:  Procedure Laterality Date  . COLONOSCOPY  2006   normal  . lap band surgery  2009  . left inguinal hernia repair  1998  . LIPOMA EXCISION Left 04/17/2023   Procedure: MINOR EXCISION LEFT BUTTOCK SEBACEOUS CYST;  Surgeon: Lyndel Deward PARAS, MD;  Location: [facility];  Service: General;  Laterality: Left;  . robotic prostatectomy  2008      Social History:   reports that he has never smoked. He has never used smokeless tobacco. He reports current alcohol use. He reports that he does not use drugs.   Family History:  His family history includes COPD in his sister; CVA in his sister; Diabetes in his daughter; Heart attack in his father; Heart failure in his mother; Hypertension in his father. There is no history of Colon cancer, Stomach cancer, Colon polyps, Esophageal cancer, or Rectal cancer.   Allergies Allergies  Allergen Reactions  . Ace Inhibitors Cough  . Maxidex [Dexamethasone] Other (See Comments)    Sexual dysfunction  . Verapamil Other (See Comments)    constipation     Home Medications  Prior to Admission medications   Medication Sig Start Date End Date Taking? Authorizing Provider  acetaminophen  (TYLENOL ) 500 MG tablet Take 500 mg by mouth every 6 (six) hours as needed for mild pain.    [provider]  Blood Glucose Monitoring Suppl (ONE TOUCH ULTRA SYSTEM KIT) W/DEVICE KIT 1 kit by Does not apply route once. One touch ultrasoft lancets    [provider]  carvedilol  (COREG ) 25 MG tablet Take 1 tablet (25 mg total) by mouth 2 (two) times daily with food 04/01/24     clobetasol  ointment (TEMOVATE ) 0.05 % Apply topically daily before sleeping. 05/19/24     Continuous Glucose Sensor (FREESTYLE LIBRE 3 PLUS SENSOR) MISC Apply to upper back of arm. Change every 15 days. 04/03/24     Continuous Glucose Sensor (FREESTYLE LIBRE 3 SENSOR) MISC Apply to upper back of arm and change ever 14 days. 01/28/24   Faythe Purchase, MD  dapagliflozin  propanediol (FARXIGA ) 10 MG TABS tablet Take 1 tablet (10 mg total) by mouth daily. 04/14/24     HUMALOG  KWIKPEN 200 UNIT/ML KwikPen Inject 10-22 Units into the skin 3 (three) times daily. PLUS 4 extra units if blood sugar over 200 mg/dL 2/75/76   [provider]  hydrALAZINE  (APRESOLINE ) 100 MG tablet Take 1/2 tablet (50 mg total) by mouth 2 (two) times daily.  08/30/23   Clegg, Amy D, NP  insulin  glargine (LANTUS  SOLOSTAR) 100 UNIT/ML Solostar Pen Inject 30 Units into the skin daily. Titrate when directed. Max daily dose of 50 units. 04/14/24     insulin  glargine (LANTUS ) 100 UNIT/ML injection Inject 34 Units into the skin daily.    [provider]  insulin  lispro (HUMALOG  KWIKPEN) 200 UNIT/ML KwikPen Inject under the skin 12 units at small meals and 24 units at regular meals  3 times a day 08/08/23     Insulin  Pen Needle (TECHLITE PEN NEEDLES) 32G X 4 MM MISC Use to inject insulin  4 times daily 10/25/22   Faythe Purchase, MD  Insulin  Pen Needle 32G X 4 MM MISC Use to inject insulin  up to 4 times daily.  12/26/23   Faythe Purchase, MD  levothyroxine  (SYNTHROID ) 125 MCG tablet Take 1 tablet (125 mcg total) by mouth every morning on an empty stomach 06/08/24     potassium chloride  SA (KLOR-CON  M) 20 MEQ tablet Take 3 tablets (60 mEq total) by mouth daily. 03/10/24   Milford, Harlene HERO, FNP  sacubitril -valsartan  (ENTRESTO ) 49-51 MG Take 1 tablet by mouth 2 (two) times daily. 08/25/24   Bensimhon, Toribio SAUNDERS, MD  simvastatin  (ZOCOR ) 20 MG tablet Take 1 tablet (20 mg total) by mouth every evening. 07/09/24     tirzepatide  (MOUNJARO ) 12.5 MG/0.5ML Pen Inject 12.5 mg into the skin once a week. Patient not taking: Reported on 08/10/2024 03/02/24     tirzepatide  (MOUNJARO ) 15 MG/0.5ML Pen Inject 15 mg into the skin once a week. 04/14/24     torsemide  (DEMADEX ) 20 MG tablet Take 3 tablets (60 mg total) by mouth daily. 08/08/23 08/10/24  Glena Harlene HERO, FNP     Critical care time: ***

## 2024-08-28 NOTE — ED Triage Notes (Signed)
 Pt reports swelling, pain to groin and penis. Pt also reports wound to perineum with drainage x2 days.

## 2024-08-28 NOTE — ED Notes (Addendum)
 Handoff report given to Delon Renshaw RN charge at [facility] ER

## 2024-08-28 NOTE — Op Note (Addendum)
   Patient: Brendan [name]. (15-Mar-1956, [id number])  Date of Surgery: 08/28/2024  Preoperative Diagnosis: Necrotizing soft tissue infection of the perineum and scrotum   Postoperative Diagnosis: Necrotizing soft tissue infection of the perineum and scrotum   Surgical Procedure: SHARP EXCISIONAL DEBRIDEMENT USING SCALPEL AND ELECTROCAUTERY TO REMOVE SKIN AND SUBCUTANEOUS TISSUE OF THE PERINEUM AND POSTERIOR SCROTUM  (12 CM ANTERIOR/POSTERIOR BY 4 CM MEDIAL/LATERAL BY 4CM DEEP TOTAL DEBRIDEMENT)  Operative Team Members:  Surgeons and Role:    * Demetress Tift, Brendan PARAS, MD - Primary   Anesthesiologist: Leonce Athens, MD CRNA: Roddie Grate, CRNA   Anesthesia: General   Fluids:  Total I/O In: 750 [I.V.:750] Out: -   Complications: * No complications entered in OR log *  Drains:  none   Specimen:  ID Type Source Tests Collected by Time Destination  A : Perineum Tissue Culture Tissue Path Tissue AEROBIC/ANAEROBIC CULTURE W GRAM STAIN (SURGICAL/DEEP WOUND) Vandy Tsuchiya, Brendan PARAS, MD 08/28/2024 2216      Disposition:  PACU - hemodynamically stable. Then ICU  Plan of Care: ICU    Indications for Procedure: Brendan [name]. is a 68 y.o. male who presented with a necrotizing infection of the perineum.  I recommended emergent excisional debridement.  The procedure itself as well as its risks, benefits and alternatives were discussed.  The risks discussed included but were not limited to the risk of infection, bleeding, damage to nearby structures, and recurrent or worsening infection.  After a full discussion and all questions answered the patient granted consent to proceed.  Findings: Necrotizing infection of the posterior scrotum and perineum   Description of Procedure:   Brendan [name] was taken the operating room.  General anesthesia was induced.  He was placed in lithotomy position.  The perineum was prepped and draped in usual sterile fashion.  A timeout was completed.  I  made an incision around the indurated and crepitus area of the perineum.  There was healthy bleeding skin but gray necrotic tissue underlying the skin.  This necrotic tissue tracked anterior and posteriorly in the midline.  I did my best to excise all necrotic tissue.  This was done using a scalpel and electrocautery.  This involved resection of skin, and subcutaneous tissue of the perineum and posterior scrotum.  The scrotum involvement was limited to a small posterior area of the scrotum and was remote from the testicles or other structures so I felt urology involvement at this point would not be necessary.  Once all necrotic tissue had been removed I ensured hemostasis with electrocautery.  I washed the wound using Vashe.  It was then irrigated with saline.  I then packed it with Kerlix and a sterile dressing was applied.  I anticipate the patient may need to return to the operating room depending on the progress of wound healing as determined by exam at bedside by the surgery team.  I explained to his wife that we may proceed back to the operating room in 24 to 72 hours.  The patient was woken from anesthesia and taken the postanesthesia care in stable condition.  The patient will be taken to the ICU postoperatively.  At the end of the case we reviewed the infection status of the case. Patient: Brendan [name] [facility] Patient Case: Emergent Infection Present At Time Of Surgery (PATOS): Necrotizing infection of the perineum and scrotum.  Brendan Foy, MD General, Bariatric, & Minimally Invasive Surgery [facility], [place]

## 2024-08-28 NOTE — ED Notes (Signed)
 [facility] at bedside

## 2024-08-28 NOTE — Transfer of Care (Signed)
 Immediate Anesthesia Transfer of Care Note  Patient: Brendan [name].  Procedure(s) Performed: IRRIGATION AND EXCISIONAL DEBRIDEMENT WOUND  Patient Location: PACU  Anesthesia Type:General  Level of Consciousness: awake and alert   Airway & Oxygen Therapy: Patient Spontanous Breathing and Patient connected to face mask oxygen  Post-op Assessment: Report given to RN and Post -op Vital signs reviewed and stable  Post vital signs: Reviewed and stable  Last Vitals:  Vitals Value Taken Time  BP 138/74 08/28/24 22:47  Temp 36.7 C 08/28/24 22:47  Pulse 69 08/28/24 22:58  Resp 15 08/28/24 22:58  SpO2 99 % 08/28/24 22:58  Vitals shown include unfiled device data.  Last Pain:  Vitals:   08/28/24 2247  TempSrc:   PainSc: 5          Complications: No notable events documented.

## 2024-08-29 ENCOUNTER — Encounter (HOSPITAL_COMMUNITY): Payer: Self-pay | Admitting: Surgery

## 2024-08-29 DIAGNOSIS — A419 Sepsis, unspecified organism: Secondary | ICD-10-CM | POA: Diagnosis present

## 2024-08-29 LAB — GLUCOSE, CAPILLARY
Glucose-Capillary: 194 mg/dL — ABNORMAL HIGH (ref 70–99)
Glucose-Capillary: 219 mg/dL — ABNORMAL HIGH (ref 70–99)
Glucose-Capillary: 379 mg/dL — ABNORMAL HIGH (ref 70–99)
Glucose-Capillary: 347 mg/dL — ABNORMAL HIGH (ref 70–99)
Glucose-Capillary: 286 mg/dL — ABNORMAL HIGH (ref 70–99)

## 2024-08-29 LAB — BASIC METABOLIC PANEL WITH GFR
Anion gap: 14 (ref 5–15)
BUN: 41 mg/dL — ABNORMAL HIGH (ref 8–23)
CO2: 21 mmol/L — ABNORMAL LOW (ref 22–32)
Calcium: 8.3 mg/dL — ABNORMAL LOW (ref 8.9–10.3)
Chloride: 100 mmol/L (ref 98–111)
Creatinine, Ser: 3.55 mg/dL — ABNORMAL HIGH (ref 0.61–1.24)
GFR, Estimated: 18 mL/min — ABNORMAL LOW (ref >60)
Glucose, Bld: 195 mg/dL — ABNORMAL HIGH (ref 70–99)
Potassium: 4.6 mmol/L (ref 3.5–5.1)
Sodium: 135 mmol/L (ref 135–145)

## 2024-08-29 LAB — CBC
HCT: 32.3 % — ABNORMAL LOW (ref 39.0–52.0)
Hemoglobin: 11 g/dL — ABNORMAL LOW (ref 13.0–17.0)
MCH: 28.8 pg (ref 26.0–34.0)
MCHC: 34.1 g/dL (ref 30.0–36.0)
MCV: 84.6 fL (ref 80.0–100.0)
Platelets: 327 K/uL (ref 150–400)
RBC: 3.82 MIL/uL — ABNORMAL LOW (ref 4.22–5.81)
RDW: 14.8 % (ref 11.5–15.5)
WBC: 26.3 K/uL — ABNORMAL HIGH (ref 4.0–10.5)
nRBC: 0 % (ref 0.0–0.2)

## 2024-08-29 LAB — HIV ANTIBODY (ROUTINE TESTING W REFLEX): HIV Screen 4th Generation wRfx: NONREACTIVE

## 2024-08-29 LAB — MAGNESIUM: Magnesium: 1.6 mg/dL — ABNORMAL LOW (ref 1.7–2.4)

## 2024-08-29 LAB — HEMOGLOBIN A1C
Hgb A1c MFr Bld: 8.4 % — ABNORMAL HIGH (ref 4.8–5.6)
Mean Plasma Glucose: 194.38 mg/dL

## 2024-08-29 LAB — MRSA NEXT GEN BY PCR, NASAL: MRSA by PCR Next Gen: NOT DETECTED

## 2024-08-29 LAB — PSA: Prostatic Specific Antigen: <0.02 ng/mL (ref 0.00–4.00)

## 2024-08-29 MED ORDER — DORZOLAMIDE HCL 2 % OP SOLN
1.0000 [drp] | Freq: Every day | OPHTHALMIC | Status: DC
  Administered 2024-08-29 – 2024-09-03 (×6): 1 [drp] via OPHTHALMIC
  Filled 2024-08-29 (×2): qty 10

## 2024-08-29 MED ORDER — METRONIDAZOLE 500 MG/100ML IV SOLN
500.0000 mg | Freq: Two times a day (BID) | INTRAVENOUS | Status: DC
  Administered 2024-08-29 – 2024-08-30 (×5): 500 mg via INTRAVENOUS
  Filled 2024-08-29 (×5): qty 100

## 2024-08-29 MED ORDER — MAGNESIUM SULFATE 4 GM/100ML IV SOLN
4.0000 g | Freq: Once | INTRAVENOUS | Status: AC
  Administered 2024-08-29: 4 g via INTRAVENOUS
  Filled 2024-08-29: qty 100

## 2024-08-29 MED ORDER — ONDANSETRON HCL 4 MG/2ML IJ SOLN
4.0000 mg | Freq: Four times a day (QID) | INTRAMUSCULAR | Status: DC | PRN
  Administered 2024-08-30 – 2024-09-01 (×2): 4 mg via INTRAVENOUS
  Filled 2024-08-29 (×2): qty 2

## 2024-08-29 MED ORDER — VANCOMYCIN HCL 1500 MG/300ML IV SOLN
1500.0000 mg | INTRAVENOUS | Status: DC
  Administered 2024-08-29: 1500 mg via INTRAVENOUS
  Filled 2024-08-29: qty 300

## 2024-08-29 MED ORDER — INSULIN GLARGINE 100 UNIT/ML ~~LOC~~ SOLN
12.0000 [IU] | Freq: Every day | SUBCUTANEOUS | Status: DC
  Administered 2024-08-29: 12 [IU] via SUBCUTANEOUS
  Filled 2024-08-29 (×2): qty 0.12

## 2024-08-29 MED ORDER — TIMOLOL MALEATE 0.5 % OP SOLN
1.0000 [drp] | Freq: Every day | OPHTHALMIC | Status: DC
  Administered 2024-08-29 – 2024-09-03 (×6): 1 [drp] via OPHTHALMIC
  Filled 2024-08-29 (×2): qty 5

## 2024-08-29 MED ORDER — LEVOTHYROXINE SODIUM 25 MCG PO TABS
125.0000 ug | ORAL_TABLET | Freq: Every day | ORAL | Status: DC
  Administered 2024-08-29 – 2024-09-03 (×6): 125 ug via ORAL
  Filled 2024-08-29 (×6): qty 1

## 2024-08-29 MED ORDER — LINEZOLID 600 MG/300ML IV SOLN
600.0000 mg | Freq: Two times a day (BID) | INTRAVENOUS | Status: DC
  Administered 2024-08-30 – 2024-08-31 (×2): 600 mg via INTRAVENOUS
  Filled 2024-08-29 (×3): qty 300

## 2024-08-29 MED ORDER — MORPHINE SULFATE (PF) 2 MG/ML IV SOLN
2.0000 mg | INTRAVENOUS | Status: DC | PRN
  Administered 2024-08-29 (×2): 2 mg via INTRAVENOUS
  Filled 2024-08-29 (×2): qty 1

## 2024-08-29 MED ORDER — CARVEDILOL 25 MG PO TABS
25.0000 mg | ORAL_TABLET | Freq: Two times a day (BID) | ORAL | Status: DC
  Administered 2024-08-29 – 2024-09-03 (×8): 25 mg via ORAL
  Filled 2024-08-29 (×9): qty 1

## 2024-08-29 MED ORDER — INSULIN ASPART 100 UNIT/ML IJ SOLN
0.0000 [IU] | INTRAMUSCULAR | Status: DC
  Administered 2024-08-29: 15 [IU] via SUBCUTANEOUS
  Administered 2024-08-29: 7 [IU] via SUBCUTANEOUS
  Administered 2024-08-29: 11 [IU] via SUBCUTANEOUS
  Administered 2024-08-29: 4 [IU] via SUBCUTANEOUS
  Administered 2024-08-29: 15 [IU] via SUBCUTANEOUS
  Administered 2024-08-30 (×2): 3 [IU] via SUBCUTANEOUS
  Administered 2024-08-30 – 2024-08-31 (×3): 7 [IU] via SUBCUTANEOUS
  Administered 2024-08-31 (×2): 4 [IU] via SUBCUTANEOUS
  Administered 2024-08-31: 7 [IU] via SUBCUTANEOUS
  Administered 2024-09-01: 3 [IU] via SUBCUTANEOUS

## 2024-08-29 MED ORDER — HEPARIN SODIUM (PORCINE) 5000 UNIT/ML IJ SOLN
5000.0000 [IU] | Freq: Three times a day (TID) | INTRAMUSCULAR | Status: DC
Start: 2024-08-29 — End: 2024-09-03
  Administered 2024-08-29 – 2024-09-03 (×16): 5000 [IU] via SUBCUTANEOUS
  Filled 2024-08-29 (×16): qty 1

## 2024-08-29 MED ORDER — ORAL CARE MOUTH RINSE: 15.0000 mL | OROMUCOSAL | Status: DC | PRN

## 2024-08-29 MED ORDER — SODIUM CHLORIDE 0.9 % IV SOLN
2.0000 g | INTRAVENOUS | Status: DC
  Administered 2024-08-29 – 2024-09-01 (×4): 2 g via INTRAVENOUS
  Filled 2024-08-29 (×4): qty 12.5

## 2024-08-29 NOTE — Progress Notes (Signed)
 Pharmacy Antibiotic Note  Brendan [name]. is a 68 y.o. male admitted on 08/28/2024 with sepsis and nec fasc, now s/p debridement.  Pharmacy has been consulted for vancomycin and cefepime dosing.  Plan: Vancomycin 1500mg  IV Q48H. Goal AUC 400-550.  Expected AUC 520. Cefepime 2g IV Q24H.  Height: 6' 2 (188 cm) Weight: 129.3 kg (285 lb) IBW/kg (Calculated) : 82.2  Temp (24hrs), Avg:98.2 F (36.8 C), Min:98 F (36.7 C), Max:98.4 F (36.9 C)  Recent Labs  Lab 08/28/24 1613 08/28/24 1822  WBC 22.2*  --   CREATININE 3.79*  --   LATICACIDVEN 2.4* 1.4    Estimated Creatinine Clearance: 26.3 mL/min (A) (by C-G formula based on SCr of 3.79 mg/dL (H)).    Allergies  Allergen Reactions   Ace Inhibitors Cough   Maxidex [Dexamethasone] Other (See Comments)    Sexual dysfunction   Verapamil Other (See Comments)    constipation    Thank you for allowing pharmacy to be a part of this patient's care.  Marvetta Dauphin, PharmD, BCPS  08/29/2024 12:57 AM

## 2024-08-29 NOTE — Progress Notes (Signed)
 Patient ID: Brendan [name]., male   DOB: 1956/04/01, 68 y.o.   MRN: [id number]  1 Day Post-Op Subjective: Pt without complaints this morning.  Pain controlled.  Objective: Vital signs in last 24 hours: Temp:  [97.4 F (36.3 C)-98.4 F (36.9 C)] 97.5 F (36.4 C) (10/11 0715) Pulse Rate:  [61-78] 69 (10/11 0800) Resp:  [7-36] 17 (10/11 0800) BP: (109-185)/(69-94) 144/73 (10/11 0800) SpO2:  [91 %-100 %] 100 % (10/11 0800) Weight:  [108.1 kg-129.3 kg] 108.1 kg (10/11 0500)  Intake/Output from previous day: 10/10 0701 - 10/11 0700 In: 2580 [I.V.:1142.6; IV Piggyback:1437.4] Out: 10 [Blood:10] Intake/Output this shift: Total I/O In: 360 [P.O.:360] Out: 450 [Urine:450]  Physical Exam:  General: Alert and oriented GU: Portion of posterior midline of the scrotum debrided by general surgery.  Wound looks good and wound edges are clean.  No crepitus or fluctuance.  Scrotum edematous related to local inflammatory process but appears otherwise viable at this time.  Lab Results: Recent Labs    08/28/24 1613 08/29/24 0329  HGB 11.7* 11.0*  HCT 34.4* 32.3*      Latest Ref Rng & Units 08/29/2024    3:29 AM 08/28/2024    4:13 PM 03/10/2024   12:13 PM  CBC  WBC 4.0 - 10.5 K/uL 26.3  22.2  12.1   Hemoglobin 13.0 - 17.0 g/dL 88.9  88.2  87.7   Hematocrit 39.0 - 52.0 % 32.3  34.4  36.5   Platelets 150 - 400 K/uL 327  347  309      BMET Recent Labs    08/28/24 1613 08/29/24 0326  NA 135 135  K 4.5 4.6  CL 99 100  CO2 22 21*  GLUCOSE 177* 195*  BUN 41* 41*  CREATININE 3.79* 3.55*  CALCIUM 9.2 8.3*     Studies/Results: CT PELVIS WO CONTRAST Result Date: 08/28/2024 CLINICAL DATA:  Soft tissue infection suspected, perenial abscess EXAM: CT PELVIS WITHOUT CONTRAST TECHNIQUE: Multidetector CT imaging of the pelvis was performed following the standard protocol without intravenous contrast. RADIATION DOSE REDUCTION: This exam was performed according to the departmental  dose-optimization program which includes automated exposure control, adjustment of the mA and/or kV according to patient size and/or use of iterative reconstruction technique. COMPARISON:  None Available. FINDINGS: Urinary Tract:  No abnormality visualized. Bowel:  Unremarkable visualized pelvic bowel loops. Vascular/Lymphatic: No pathologically enlarged lymph nodes. No significant vascular abnormality seen. Small bilateral inguinal lymph nodes, none pathologically enlarged. Reproductive: Scrotal wall edema noted. There is gas within the scrotum and lower perineum concerning for infection with gas-forming organism/necrotizing fasciitis. Other:  No free fluid.  Small umbilical hernia containing fat. Musculoskeletal: No acute bony abnormality. IMPRESSION: Edema/swelling throughout the scrotal wall. Gas noted in the scrotum and lower perineum concerning for necrotizing fasciitis. These results were called by telephone at the time of interpretation on 08/28/2024 at 6:03 pm to provider WHITNEY PLUNKETT , who verbally acknowledged these results. Electronically Signed   By: Franky Crease M.D.   On: 08/28/2024 18:04    Assessment/Plan: Necrotizing fasciitis: No indication for further GU debridement at this time.  Will re-examine tomorrow and will be involved as needed. Prostate cancer: I had done Nicklous's radical prostatectomy years ago.  He has not had a recent PSA and will check that while he is here for surveillance purposes.   LOS: 1 day   Noretta Ferrara 08/29/2024, 8:44 AM

## 2024-08-29 NOTE — Anesthesia Postprocedure Evaluation (Signed)
 Anesthesia Post Note  Patient: Brendan [name].  Procedure(s) Performed: IRRIGATION AND EXCISIONAL DEBRIDEMENT WOUND     Patient location during evaluation: PACU Anesthesia Type: General Level of consciousness: awake and alert, oriented and patient cooperative Pain management: pain level controlled Vital Signs Assessment: post-procedure vital signs reviewed and stable Respiratory status: spontaneous breathing, nonlabored ventilation, respiratory function stable and patient connected to nasal cannula oxygen Cardiovascular status: blood pressure returned to baseline and stable Postop Assessment: no apparent nausea or vomiting and adequate PO intake Anesthetic complications: no Comments: *delayed note entry: pt eval in PACU post op   No notable events documented.                Krystie Leiter,E. Zareena Willis

## 2024-08-29 NOTE — Progress Notes (Signed)
 1 Day Post-Op  Subjective: Off pressors and overall doing okay. Dressing change performed at bedside with RN assistance.  ROS: See above, otherwise other systems negative  Objective: Vital signs in last 24 hours: Temp:  [97.4 F (36.3 C)-98.4 F (36.9 C)] 97.5 F (36.4 C) (10/11 0715) Pulse Rate:  [61-78] 67 (10/11 0715) Resp:  [7-36] 15 (10/11 0715) BP: (109-185)/(69-94) 131/69 (10/11 0700) SpO2:  [91 %-100 %] 100 % (10/11 0715) Weight:  [108.1 kg-129.3 kg] 108.1 kg (10/11 0500)    Intake/Output from previous day: 10/10 0701 - 10/11 0700 In: 2580 [I.V.:1142.6; IV Piggyback:1437.4] Out: 10 [Blood:10] Intake/Output this shift: No intake/output data recorded.  PE: Gen: male, NAD Perineum: Incision with a small amount of necrotic tissue at the base, no significant drainage, wound base with bleeding  Lab Results:  Recent Labs    08/28/24 1613 08/29/24 0329  WBC 22.2* 26.3*  HGB 11.7* 11.0*  HCT 34.4* 32.3*  PLT 347 327   BMET Recent Labs    08/28/24 1613 08/29/24 0326  NA 135 135  K 4.5 4.6  CL 99 100  CO2 22 21*  GLUCOSE 177* 195*  BUN 41* 41*  CREATININE 3.79* 3.55*  CALCIUM 9.2 8.3*   PT/INR Recent Labs    08/28/24 1613  LABPROT 13.9  INR 1.0   CMP     Component Value Date/Time   NA 135 08/29/2024 0326   NA 140 09/03/2022 0849   K 4.6 08/29/2024 0326   CL 100 08/29/2024 0326   CO2 21 (L) 08/29/2024 0326   GLUCOSE 195 (H) 08/29/2024 0326   BUN 41 (H) 08/29/2024 0326   BUN 26 09/03/2022 0849   CREATININE 3.55 (H) 08/29/2024 0326   CALCIUM 8.3 (L) 08/29/2024 0326   PROT 7.2 08/28/2024 1613   ALBUMIN 3.7 08/28/2024 1613   AST 16 08/28/2024 1613   ALT 17 08/28/2024 1613   ALKPHOS 98 08/28/2024 1613   BILITOT 0.5 08/28/2024 1613   GFRNONAA 18 (L) 08/29/2024 0326   GFRAA 29 (L) 08/25/2020 1144   Lipase  No results found for: LIPASE  Studies/Results: CT PELVIS WO CONTRAST Result Date: 08/28/2024 CLINICAL DATA:  Soft tissue  infection suspected, perenial abscess EXAM: CT PELVIS WITHOUT CONTRAST TECHNIQUE: Multidetector CT imaging of the pelvis was performed following the standard protocol without intravenous contrast. RADIATION DOSE REDUCTION: This exam was performed according to the departmental dose-optimization program which includes automated exposure control, adjustment of the mA and/or kV according to patient size and/or use of iterative reconstruction technique. COMPARISON:  None Available. FINDINGS: Urinary Tract:  No abnormality visualized. Bowel:  Unremarkable visualized pelvic bowel loops. Vascular/Lymphatic: No pathologically enlarged lymph nodes. No significant vascular abnormality seen. Small bilateral inguinal lymph nodes, none pathologically enlarged. Reproductive: Scrotal wall edema noted. There is gas within the scrotum and lower perineum concerning for infection with gas-forming organism/necrotizing fasciitis. Other:  No free fluid.  Small umbilical hernia containing fat. Musculoskeletal: No acute bony abnormality. IMPRESSION: Edema/swelling throughout the scrotal wall. Gas noted in the scrotum and lower perineum concerning for necrotizing fasciitis. These results were called by telephone at the time of interpretation on 08/28/2024 at 6:03 pm to provider WHITNEY PLUNKETT , who verbally acknowledged these results. Electronically Signed   By: Franky Crease M.D.   On: 08/28/2024 18:04    Anti-infectives: Anti-infectives (From admission, onward)    Start     Dose/Rate Route Frequency Ordered Stop   08/29/24 0600  vancomycin (VANCOREADY) IVPB 1500 mg/300 mL  1,500 mg 150 mL/hr over 120 Minutes Intravenous Every 48 hours 08/29/24 0100     08/29/24 0400  ceFEPIme (MAXIPIME) 2 g in sodium chloride  0.9 % 100 mL IVPB        2 g 200 mL/hr over 30 Minutes Intravenous Every 24 hours 08/29/24 0100     08/29/24 0030  metroNIDAZOLE (FLAGYL) IVPB 500 mg        500 mg 100 mL/hr over 60 Minutes Intravenous Every 12  hours 08/29/24 0025     08/28/24 1815  piperacillin-tazobactam (ZOSYN) IVPB 3.375 g        3.375 g 100 mL/hr over 30 Minutes Intravenous  Once 08/28/24 1805 08/28/24 1908   08/28/24 1815  linezolid (ZYVOX) IVPB 600 mg        600 mg 300 mL/hr over 60 Minutes Intravenous  Once 08/28/24 1805 08/28/24 1923   08/28/24 1730  cefTRIAXone (ROCEPHIN) 2 g in sodium chloride  0.9 % 100 mL IVPB        2 g 200 mL/hr over 30 Minutes Intravenous  Once 08/28/24 1724 08/28/24 1821   08/28/24 1715  cefTRIAXone (ROCEPHIN) injection 2 g  Status:  Discontinued        2 g Intramuscular  Once 08/28/24 1705 08/28/24 1724       Assessment/Plan 68 y/o M with NSTI s/p debridement of the perineum 10/10  - BID dressing changes or when soiled - Continue abx - Likely will need to return to the OR to evaluate the wound for additional debridement +/- vac - Okay for carb controlled diet today. NPO at MN   LOS: 1 day   Cordella DELENA Polly Marlis Cheron Surgery 08/29/2024, 7:41 AM Please see Amion for pager number during day hours 7:00am-4:30pm or 7:00am -11:30am on weekends

## 2024-08-29 NOTE — Progress Notes (Signed)
 NAME:  Brendan Feeny., MRN:  [id number], DOB:  July 03, 1956, LOS: 1 ADMISSION DATE:  08/28/2024, CONSULTATION DATE:  08/29/2024 REFERRING MD:  Deward Foy, MD, CHIEF COMPLAINT:  groin pain and swelling  History of Present Illness:  69 y/o male with PMH DMT2, HTN, Hypothyroid, OSA, Obesity, CKD stage IV, HFpEF who c/o groin pain and swelling.  He had a boil behind his testicles which started draining 10/9.  He also noted scrotal and penile swelling.  The patient has been seeing a Dermatologist for Atopic Dermatitis.  WBC 22, Cr 3.8, LA 2.4-->1.4. Patient on CT abd was noted to have scrotal wall edema noted. There is gas within the scrotum and lower perineum concerning for infection with gas-forming organism/necrotizing fasciitis. He was taken to OR for debridement.  Patient presented to ICU extubated and not on pressors. Pertinent  Medical History  DMT2, HTN, Hypothyroid, OSA, CKD stage IV, HFpEF, obesity  Significant Hospital Events: Including procedures, antibiotic start and stop dates in addition to other pertinent events   10/11: admit to ICU  Interim History / Subjective:  Patient is doing well this am. Was on CPAP overnight. Decent urine output overnight. BP is creeping up. Patient feels better. Able to eat and drink.  Objective    Blood pressure (!) 160/82, pulse 64, temperature 97.6 F (36.4 C), temperature source Oral, resp. rate 16, height 6' 2 (1.88 m), weight 108.1 kg, SpO2 99%.        Intake/Output Summary (Last 24 hours) at 08/29/2024 1251 Last data filed at 08/29/2024 1200 Gross per 24 hour  Intake 3628.95 ml  Output 810 ml  Net 2818.95 ml   Filed Weights   08/28/24 1539 08/29/24 0500  Weight: 129.3 kg 108.1 kg    Examination:   Physical exam: General: AAO X 3. No distress. HEENT: [place]/AT, eyes anicteric.  moist mucus membranes Neuro: Alert, awake following commands. No focal neurological deficit. Chest: Coarse breath sounds, no wheezes or  rhonchi Heart: Regular rate and rhythm, no murmurs or gallops Abdomen: Soft, nontender, nondistended, bowel sounds present GU: Purewick catheter present. Post op scrotal bandage.   Resolved problem list   Assessment and Plan  Necrotizing Fasciitis posterior scrotum s/p surgical debridement Post op day 1 Broad spectrum antibiotics - Zyvox(changed from vancomycin due to CKD 4) and cefepime and flagyl. Surgical f/u - Plans for OR tomorrow  DMT2 FS and SSI Maintain blood sugars 140 to 180 Start basal insulin  at 12 units today  Sepsis with Lactic Acidosis Broad spectrum antibiotics Lactic acidosis has resolved Remains off pressors  CKD stage IV Monitor serum CR Monitor Is/O2 Decent Urine Output Will stop IVF  HFpEF Stop IVF GDMT - Restart Coreg . Hold Entresto , Farxiga , and demadex . Restart as able.  HTN BP management Start Coreg  and monitor. Restart hydarlazine if BP continues to go up. Pain management as well will help with BP  Hypothyroidism Check TSH Resume Synthroid   OSA Patient will use his home CPAP  D/W Surgery - Okay to transfer out of ICU TRH to pick up tomorrow. Transfer orders placed. Restart patients eye drops.   Labs   CBC: Recent Labs  Lab 08/28/24 1613 08/29/24 0329  WBC 22.2* 26.3*  NEUTROABS 18.0*  --   HGB 11.7* 11.0*  HCT 34.4* 32.3*  MCV 83.7 84.6  PLT 347 327    Basic Metabolic Panel: Recent Labs  Lab 08/28/24 1613 08/29/24 0326  NA 135 135  K 4.5 4.6  CL 99 100  CO2 22 21*  GLUCOSE 177* 195*  BUN 41* 41*  CREATININE 3.79* 3.55*  CALCIUM 9.2 8.3*  MG  --  1.6*   GFR: Estimated Creatinine Clearance: 25.7 mL/min (A) (by C-G formula based on SCr of 3.55 mg/dL (H)). Recent Labs  Lab 08/28/24 1613 08/28/24 1822 08/29/24 0329  WBC 22.2*  --  26.3*  LATICACIDVEN 2.4* 1.4  --     Liver Function Tests: Recent Labs  Lab 08/28/24 1613  AST 16  ALT 17  ALKPHOS 98  BILITOT 0.5  PROT 7.2  ALBUMIN 3.7   No results  for input(s): LIPASE, AMYLASE in the last 168 hours. No results for input(s): AMMONIA in the last 168 hours.  ABG    Component Value Date/Time   TCO2 28 02/21/2009 2157     Coagulation Profile: Recent Labs  Lab 08/28/24 1613  INR 1.0    Cardiac Enzymes: No results for input(s): CKTOTAL, CKMB, CKMBINDEX, TROPONINI in the last 168 hours.  HbA1C: Hgb A1c MFr Bld  Date/Time Value Ref Range Status  08/29/2024 03:29 AM 8.4 (H) 4.8 - 5.6 % Final    Comment:    (NOTE) Diagnosis of Diabetes The following HbA1c ranges recommended by the American Diabetes Association (ADA) may be used as an aid in the diagnosis of diabetes mellitus.  Hemoglobin             Suggested A1C NGSP%              Diagnosis  <5.7                   Non Diabetic  5.7-6.4                Pre-Diabetic  >6.4                   Diabetic  <7.0                   Glycemic control for                       adults with diabetes.    01/08/2023 01:29 AM 8.6 (H) 4.8 - 5.6 % Final    Comment:    REPEATED TO VERIFY (NOTE) Pre diabetes:          5.7%-6.4%  Diabetes:              >6.4%  Glycemic control for   <7.0% adults with diabetes     CBG: Recent Labs  Lab 08/28/24 2248 08/28/24 2352 08/29/24 0411 08/29/24 0726 08/29/24 1102  GLUCAP 82 71 194* 219* 379*    Review of Systems:   No SOB or chest pain, positive surgical site pain  Past Medical History:  He,  has a past medical history of Acanthosis nigricans, Atopic dermatitis, CHF (congestive heart failure) (HCC), CKD (chronic kidney disease) stage 4, GFR 15-29 ml/min (HCC) (01/08/2023), Diabetes (HCC) (11/19/2002), Erectile dysfunction, Fatty liver (11/19/2005), Glaucoma (11/19/2006), Gynecomastia (11/20/2007), adenomatous colonic polyps (08/26/2023), Hyperaldosteronism (11/19/1998), Hypercholesterolemia, Hyperlipidemia (2010), Hypertension (1999), Hypoglycemic reaction, Hypothyroidism (11/19/2002), Incontinence, Intermittent vertigo  (11/19/2010), Left cervical radiculopathy (11/19/2010), Lumbar radiculopathy, Obesity, Peripheral neuropathy, Pollen allergies (11/19/2005), Prostate cancer (HCC) (11/19/2004), Reflux esophagitis (11/19/1993), Sickle cell trait (11/19/2004), Sleep apnea, obstructive (11/19/1998), Venous insufficiency (11/20/2003), and Vitamin D deficiency (11/19/2010).   Surgical History:   Past Surgical History:  Procedure Laterality Date   COLONOSCOPY  2006   normal   INCISION AND DRAINAGE OF WOUND N/A 08/28/2024   Procedure:  IRRIGATION AND EXCISIONAL DEBRIDEMENT WOUND;  Surgeon: Lyndel Deward PARAS, MD;  Location: [facility] OR;  Service: General;  Laterality: N/A;  EXCISIONAL DEBRIDEMENT   lap band surgery  2009   left inguinal hernia repair  1998   LIPOMA EXCISION Left 04/17/2023   Procedure: MINOR EXCISION LEFT BUTTOCK SEBACEOUS CYST;  Surgeon: Lyndel Deward PARAS, MD;  Location: [facility];  Service: General;  Laterality: Left;   robotic prostatectomy  2008     Social History:   reports that he has never smoked. He has never used smokeless tobacco. He reports current alcohol use. He reports that he does not use drugs.   Family History:  His family history includes COPD in his sister; CVA in his sister; Diabetes in his daughter; Heart attack in his father; Heart failure in his mother; Hypertension in his father. There is no history of Colon cancer, Stomach cancer, Colon polyps, Esophageal cancer, or Rectal cancer.   Allergies Allergies  Allergen Reactions   Ace Inhibitors Cough   Maxidex [Dexamethasone] Other (See Comments)    Sexual dysfunction   Verapamil Other (See Comments)    constipation     Home Medications  Prior to Admission medications   Medication Sig Start Date End Date Taking? Authorizing Provider  acetaminophen  (TYLENOL ) 500 MG tablet Take 500 mg by mouth every 6 (six) hours as needed for mild pain.    [provider]  Blood Glucose Monitoring Suppl (ONE TOUCH  ULTRA SYSTEM KIT) W/DEVICE KIT 1 kit by Does not apply route once. One touch ultrasoft lancets    [provider]  carvedilol  (COREG ) 25 MG tablet Take 1 tablet (25 mg total) by mouth 2 (two) times daily with food 04/01/24     clobetasol  ointment (TEMOVATE ) 0.05 % Apply topically daily before sleeping. 05/19/24     Continuous Glucose Sensor (FREESTYLE LIBRE 3 PLUS SENSOR) MISC Apply to upper back of arm. Change every 15 days. 04/03/24     Continuous Glucose Sensor (FREESTYLE LIBRE 3 SENSOR) MISC Apply to upper back of arm and change ever 14 days. 01/28/24   Faythe Purchase, MD  dapagliflozin  propanediol (FARXIGA ) 10 MG TABS tablet Take 1 tablet (10 mg total) by mouth daily. 04/14/24     HUMALOG  KWIKPEN 200 UNIT/ML KwikPen Inject 10-22 Units into the skin 3 (three) times daily. PLUS 4 extra units if blood sugar over 200 mg/dL 2/75/76   [provider]  hydrALAZINE  (APRESOLINE ) 100 MG tablet Take 1/2 tablet (50 mg total) by mouth 2 (two) times daily. 08/30/23   Clegg, Amy D, NP  insulin  glargine (LANTUS  SOLOSTAR) 100 UNIT/ML Solostar Pen Inject 30 Units into the skin daily. Titrate when directed. Max daily dose of 50 units. 04/14/24     insulin  glargine (LANTUS ) 100 UNIT/ML injection Inject 34 Units into the skin daily.    [provider]  insulin  lispro (HUMALOG  KWIKPEN) 200 UNIT/ML KwikPen Inject under the skin 12 units at small meals and 24 units at regular meals  3 times a day 08/08/23     Insulin  Pen Needle (TECHLITE PEN NEEDLES) 32G X 4 MM MISC Use to inject insulin  4 times daily 10/25/22   Faythe Purchase, MD  Insulin  Pen Needle 32G X 4 MM MISC Use to inject insulin  up to 4 times daily. 12/26/23   Faythe Purchase, MD  levothyroxine  (SYNTHROID ) 125 MCG tablet Take 1 tablet (125 mcg total) by mouth every morning on an empty stomach 06/08/24     potassium chloride  SA (  KLOR-CON  M) 20 MEQ tablet Take 3 tablets (60 mEq total) by mouth daily. 03/10/24   [place], Harlene HERO, FNP   sacubitril -valsartan  (ENTRESTO ) 49-51 MG Take 1 tablet by mouth 2 (two) times daily. 08/25/24   Bensimhon, Toribio SAUNDERS, MD  simvastatin  (ZOCOR ) 20 MG tablet Take 1 tablet (20 mg total) by mouth every evening. 07/09/24     tirzepatide  (MOUNJARO ) 12.5 MG/0.5ML Pen Inject 12.5 mg into the skin once a week. Patient not taking: Reported on 08/10/2024 03/02/24     tirzepatide  (MOUNJARO ) 15 MG/0.5ML Pen Inject 15 mg into the skin once a week. 04/14/24     torsemide  (DEMADEX ) 20 MG tablet Take 3 tablets (60 mg total) by mouth daily. 08/08/23 08/10/24  Glena Harlene HERO, FNP     Critical care time: 11   The patient is critically ill with multiple organ system failure and requires high complexity decision making for assessment and support, frequent evaluation and titration of therapies, advanced monitoring, review of radiographic studies and interpretation of complex data.   Critical Care Time devoted to patient care services, exclusive of separately billable procedures, described in this note is 40 minutes.   Tamela Stakes, MD  Attending Physician, Critical Care Medicine [facility] See Amion for pager If no response to pager, please call [phone] until 7pm After 7pm, Please call E-link [phone]

## 2024-08-29 NOTE — Plan of Care (Signed)
 Patient pressing well.  Plan to go to surgery tomorrow for debridement.  Vitals remain stable.

## 2024-08-30 ENCOUNTER — Other Ambulatory Visit: Payer: Self-pay

## 2024-08-30 ENCOUNTER — Encounter (HOSPITAL_COMMUNITY)
Admission: EM | Disposition: A | Discharge status: Home Health | Payer: Self-pay | Source: Ambulatory Visit | Attending: Internal Medicine

## 2024-08-30 ENCOUNTER — Encounter (HOSPITAL_COMMUNITY): Payer: Self-pay

## 2024-08-30 ENCOUNTER — Inpatient Hospital Stay (HOSPITAL_COMMUNITY): Payer: Self-pay | Admitting: Certified Registered Nurse Anesthetist

## 2024-08-30 DIAGNOSIS — S31501A Unspecified open wound of unspecified external genital organs, male, initial encounter: Secondary | ICD-10-CM | POA: Diagnosis not present

## 2024-08-30 DIAGNOSIS — I130 Hypertensive heart and chronic kidney disease with heart failure and stage 1 through stage 4 chronic kidney disease, or unspecified chronic kidney disease: Secondary | ICD-10-CM

## 2024-08-30 DIAGNOSIS — I5033 Acute on chronic diastolic (congestive) heart failure: Secondary | ICD-10-CM

## 2024-08-30 DIAGNOSIS — M726 Necrotizing fasciitis: Secondary | ICD-10-CM

## 2024-08-30 DIAGNOSIS — N184 Chronic kidney disease, stage 4 (severe): Secondary | ICD-10-CM

## 2024-08-30 HISTORY — PX: INCISION AND DRAINAGE PERIRECTAL ABSCESS: SHX1804

## 2024-08-30 LAB — GLUCOSE, CAPILLARY
Glucose-Capillary: 126 mg/dL — ABNORMAL HIGH (ref 70–99)
Glucose-Capillary: 133 mg/dL — ABNORMAL HIGH (ref 70–99)
Glucose-Capillary: 145 mg/dL — ABNORMAL HIGH (ref 70–99)
Glucose-Capillary: 150 mg/dL — ABNORMAL HIGH (ref 70–99)
Glucose-Capillary: 208 mg/dL — ABNORMAL HIGH (ref 70–99)
Glucose-Capillary: 70 mg/dL (ref 70–99)
Glucose-Capillary: 77 mg/dL (ref 70–99)
Glucose-Capillary: 84 mg/dL (ref 70–99)
Glucose-Capillary: 85 mg/dL (ref 70–99)
Glucose-Capillary: 89 mg/dL (ref 70–99)

## 2024-08-30 SURGERY — INCISION AND DRAINAGE, ABSCESS, PERIRECTAL
Anesthesia: General | Site: Perineum

## 2024-08-30 MED ORDER — HYDROMORPHONE HCL 1 MG/ML IJ SOLN
0.2500 mg | INTRAMUSCULAR | Status: DC | PRN
  Administered 2024-08-30: 0.5 mg via INTRAVENOUS
  Administered 2024-08-30 (×2): 0.25 mg via INTRAVENOUS
  Administered 2024-08-30 (×2): 0.5 mg via INTRAVENOUS

## 2024-08-30 MED ORDER — PROPOFOL 10 MG/ML IV BOLUS
INTRAVENOUS | Status: DC | PRN
  Administered 2024-08-30: 120 mg via INTRAVENOUS

## 2024-08-30 MED ORDER — SUCCINYLCHOLINE CHLORIDE 200 MG/10ML IV SOSY
PREFILLED_SYRINGE | INTRAVENOUS | Status: DC | PRN
  Administered 2024-08-30: 140 mg via INTRAVENOUS

## 2024-08-30 MED ORDER — HYDRALAZINE HCL 50 MG PO TABS
50.0000 mg | ORAL_TABLET | Freq: Three times a day (TID) | ORAL | Status: DC
  Administered 2024-08-31: 50 mg via ORAL
  Filled 2024-08-30 (×2): qty 1

## 2024-08-30 MED ORDER — CHLORHEXIDINE GLUCONATE CLOTH 2 % EX PADS: 6.0000 | MEDICATED_PAD | Freq: Once | CUTANEOUS | Status: DC

## 2024-08-30 MED ORDER — ACETAMINOPHEN 500 MG PO TABS
1000.0000 mg | ORAL_TABLET | Freq: Once | ORAL | Status: AC
  Administered 2024-08-30: 1000 mg via ORAL

## 2024-08-30 MED ORDER — ONDANSETRON HCL 4 MG/2ML IJ SOLN
INTRAMUSCULAR | Status: AC
  Filled 2024-08-30: qty 2

## 2024-08-30 MED ORDER — FENTANYL CITRATE (PF) 250 MCG/5ML IJ SOLN
INTRAMUSCULAR | Status: DC | PRN
  Administered 2024-08-30: 100 ug via INTRAVENOUS

## 2024-08-30 MED ORDER — ACETAMINOPHEN 500 MG PO TABS
ORAL_TABLET | ORAL | Status: AC
Start: 2024-08-30 — End: 2024-08-30
  Filled 2024-08-30: qty 2

## 2024-08-30 MED ORDER — ROCURONIUM BROMIDE 10 MG/ML (PF) SYRINGE
PREFILLED_SYRINGE | INTRAVENOUS | Status: DC | PRN
  Administered 2024-08-30: 50 mg via INTRAVENOUS

## 2024-08-30 MED ORDER — FENTANYL CITRATE (PF) 250 MCG/5ML IJ SOLN
INTRAMUSCULAR | Status: AC
  Filled 2024-08-30: qty 5

## 2024-08-30 MED ORDER — ORAL CARE MOUTH RINSE: 15.0000 mL | Freq: Once | OROMUCOSAL | Status: AC

## 2024-08-30 MED ORDER — HYDROMORPHONE HCL 1 MG/ML IJ SOLN
INTRAMUSCULAR | Status: AC
  Filled 2024-08-30: qty 1

## 2024-08-30 MED ORDER — MIDAZOLAM HCL 2 MG/2ML IJ SOLN: 0.5000 mg | Freq: Once | INTRAMUSCULAR | Status: DC | PRN

## 2024-08-30 MED ORDER — DEXTROSE 10 % IV SOLN: INTRAVENOUS | Status: DC

## 2024-08-30 MED ORDER — SUCCINYLCHOLINE CHLORIDE 200 MG/10ML IV SOSY
PREFILLED_SYRINGE | INTRAVENOUS | Status: AC
  Filled 2024-08-30: qty 10

## 2024-08-30 MED ORDER — LACTATED RINGERS IV SOLN: INTRAVENOUS | Status: DC | PRN

## 2024-08-30 MED ORDER — MIDAZOLAM HCL 2 MG/2ML IJ SOLN
INTRAMUSCULAR | Status: DC | PRN
  Administered 2024-08-30: 1 mg via INTRAVENOUS

## 2024-08-30 MED ORDER — LIDOCAINE 2% (20 MG/ML) 5 ML SYRINGE
INTRAMUSCULAR | Status: DC | PRN
Start: 2024-08-30 — End: 2024-08-30
  Administered 2024-08-30: 20 mg via INTRAVENOUS

## 2024-08-30 MED ORDER — 0.9 % SODIUM CHLORIDE (POUR BTL) OPTIME
TOPICAL | Status: DC | PRN
  Administered 2024-08-30: 1000 mL

## 2024-08-30 MED ORDER — ONDANSETRON HCL 4 MG/2ML IJ SOLN
INTRAMUSCULAR | Status: DC | PRN
  Administered 2024-08-30: 4 mg via INTRAVENOUS

## 2024-08-30 MED ORDER — OXYCODONE HCL 5 MG PO TABS: 5.0000 mg | ORAL_TABLET | Freq: Once | ORAL | Status: DC | PRN

## 2024-08-30 MED ORDER — MIDAZOLAM HCL 2 MG/2ML IJ SOLN
INTRAMUSCULAR | Status: AC
  Filled 2024-08-30: qty 2

## 2024-08-30 MED ORDER — ROCURONIUM BROMIDE 10 MG/ML (PF) SYRINGE
PREFILLED_SYRINGE | INTRAVENOUS | Status: AC
  Filled 2024-08-30: qty 20

## 2024-08-30 MED ORDER — SUGAMMADEX SODIUM 200 MG/2ML IV SOLN
INTRAVENOUS | Status: DC | PRN
  Administered 2024-08-30: 200 mg via INTRAVENOUS

## 2024-08-30 MED ORDER — INSULIN ASPART 100 UNIT/ML IJ SOLN: 0.0000 [IU] | INTRAMUSCULAR | Status: DC | PRN

## 2024-08-30 MED ORDER — DEXTROSE 50 % IV SOLN
INTRAVENOUS | Status: AC
  Administered 2024-08-30: 25 mL
  Filled 2024-08-30: qty 50

## 2024-08-30 MED ORDER — DEXAMETHASONE SOD PHOSPHATE PF 10 MG/ML IJ SOLN
INTRAMUSCULAR | Status: DC | PRN
  Administered 2024-08-30: 4 mg via INTRAVENOUS

## 2024-08-30 MED ORDER — CHLORHEXIDINE GLUCONATE 0.12 % MT SOLN
15.0000 mL | Freq: Once | OROMUCOSAL | Status: AC
  Administered 2024-08-30: 15 mL via OROMUCOSAL

## 2024-08-30 MED ORDER — OXYCODONE HCL 5 MG/5ML PO SOLN: 5.0000 mg | Freq: Once | ORAL | Status: DC | PRN

## 2024-08-30 MED ORDER — SODIUM CHLORIDE 0.9 % IV SOLN: INTRAVENOUS | Status: DC

## 2024-08-30 MED ORDER — LIDOCAINE 2% (20 MG/ML) 5 ML SYRINGE
INTRAMUSCULAR | Status: AC
  Filled 2024-08-30: qty 5

## 2024-08-30 SURGICAL SUPPLY — 24 items
BAG COUNTER SPONGE SURGICOUNT (BAG) ×1 IMPLANT
BNDG GAUZE DERMACEA FLUFF 4 (GAUZE/BANDAGES/DRESSINGS) IMPLANT
COVER MAYO STAND STRL (DRAPES) ×1 IMPLANT
COVER SURGICAL LIGHT HANDLE (MISCELLANEOUS) ×1 IMPLANT
ELECT CAUTERY BLADE 6.4 (BLADE) ×1 IMPLANT
ELECTRODE REM PT RTRN 9FT ADLT (ELECTROSURGICAL) ×1 IMPLANT
GAUZE SPONGE 4X4 12PLY STRL (GAUZE/BANDAGES/DRESSINGS) IMPLANT
GLOVE BIO SURGEON STRL SZ7 (GLOVE) ×1 IMPLANT
GOWN STRL REUS W/ TWL LRG LVL3 (GOWN DISPOSABLE) ×2 IMPLANT
KIT BASIN OR (CUSTOM PROCEDURE TRAY) ×1 IMPLANT
KIT TURNOVER KIT B (KITS) ×1 IMPLANT
PACK GENERAL/GYN (CUSTOM PROCEDURE TRAY) IMPLANT
PACK LITHOTOMY IV (CUSTOM PROCEDURE TRAY) IMPLANT
PAD ARMBOARD POSITIONER FOAM (MISCELLANEOUS) ×1 IMPLANT
PENCIL SMOKE EVACUATOR (MISCELLANEOUS) ×1 IMPLANT
SOLN 0.9% NACL 1000 ML (IV SOLUTION) ×1 IMPLANT
SOLN 0.9% NACL POUR BTL 1000ML (IV SOLUTION) ×1 IMPLANT
SPONGE T-LAP 18X18 ~~LOC~~+RFID (SPONGE) IMPLANT
SURGILUBE 2OZ TUBE FLIPTOP (MISCELLANEOUS) ×1 IMPLANT
SYR BULB EAR ULCER 3OZ GRN STR (SYRINGE) ×1 IMPLANT
TOWEL GREEN STERILE (TOWEL DISPOSABLE) ×1 IMPLANT
TOWEL GREEN STERILE FF (TOWEL DISPOSABLE) ×1 IMPLANT
TUBE CONNECTING 12X1/4 (SUCTIONS) ×1 IMPLANT
YANKAUER SUCT BULB TIP NO VENT (SUCTIONS) ×1 IMPLANT

## 2024-08-30 NOTE — Anesthesia Procedure Notes (Signed)
 Procedure Name: Intubation Date/Time: 08/30/2024 12:28 PM  Performed by: Boyce Shilling, CRNAPre-anesthesia Checklist: Patient identified, Emergency Drugs available, Suction available, Timeout performed and Patient being monitored Patient Re-evaluated:Patient Re-evaluated prior to induction Oxygen Delivery Method: Circle system utilized Preoxygenation: Pre-oxygenation with 100% oxygen Induction Type: IV induction, Rapid sequence and Cricoid Pressure applied Ventilation: Mask ventilation without difficulty Laryngoscope Size: Mac and 4 Grade View: Grade I Tube type: Oral Tube size: 7.5 mm Number of attempts: 1 Airway Equipment and Method: Stylet Placement Confirmation: ETT inserted through vocal cords under direct vision, positive ETCO2, CO2 detector and breath sounds checked- equal and bilateral Secured at: 24 cm Tube secured with: Tape Dental Injury: Teeth and Oropharynx as per pre-operative assessment

## 2024-08-30 NOTE — Progress Notes (Signed)
 * Day of Surgery *  Subjective: Denies new complaints.   ROS: See above, otherwise other systems negative  Objective: Vital signs in last 24 hours: Temp:  [95.9 F (35.5 C)-97.9 F (36.6 C)] 97.9 F (36.6 C) (10/12 1115) Pulse Rate:  [54-72] 61 (10/12 1115) Resp:  [8-17] 12 (10/12 1115) BP: (114-178)/(63-104) 139/68 (10/12 1115) SpO2:  [96 %-100 %] 100 % (10/12 1115) Weight:  [108.1 kg] 108.1 kg (10/12 1111)    Intake/Output from previous day: 10/11 0701 - 10/12 0700 In: 1873.6 [P.O.:840; I.V.:720.2; IV Piggyback:313.4] Out: 1500 [Urine:1500] Intake/Output this shift: Total I/O In: 171.1 [P.O.:25; I.V.:46.1; IV Piggyback:100] Out: -   PE: Gen: male, NAD Perineum: Incision with a small amount of necrotic tissue at the base, no significant drainage, wound base with bleeding  Lab Results:  Recent Labs    08/28/24 1613 08/29/24 0329  WBC 22.2* 26.3*  HGB 11.7* 11.0*  HCT 34.4* 32.3*  PLT 347 327   BMET Recent Labs    08/28/24 1613 08/29/24 0326  NA 135 135  K 4.5 4.6  CL 99 100  CO2 22 21*  GLUCOSE 177* 195*  BUN 41* 41*  CREATININE 3.79* 3.55*  CALCIUM 9.2 8.3*   PT/INR Recent Labs    08/28/24 1613  LABPROT 13.9  INR 1.0   CMP     Component Value Date/Time   NA 135 08/29/2024 0326   NA 140 09/03/2022 0849   K 4.6 08/29/2024 0326   CL 100 08/29/2024 0326   CO2 21 (L) 08/29/2024 0326   GLUCOSE 195 (H) 08/29/2024 0326   BUN 41 (H) 08/29/2024 0326   BUN 26 09/03/2022 0849   CREATININE 3.55 (H) 08/29/2024 0326   CALCIUM 8.3 (L) 08/29/2024 0326   PROT 7.2 08/28/2024 1613   ALBUMIN 3.7 08/28/2024 1613   AST 16 08/28/2024 1613   ALT 17 08/28/2024 1613   ALKPHOS 98 08/28/2024 1613   BILITOT 0.5 08/28/2024 1613   GFRNONAA 18 (L) 08/29/2024 0326   GFRAA 29 (L) 08/25/2020 1144   Lipase  No results found for: LIPASE  Studies/Results: CT PELVIS WO CONTRAST Result Date: 08/28/2024 CLINICAL DATA:  Soft tissue infection suspected,  perenial abscess EXAM: CT PELVIS WITHOUT CONTRAST TECHNIQUE: Multidetector CT imaging of the pelvis was performed following the standard protocol without intravenous contrast. RADIATION DOSE REDUCTION: This exam was performed according to the departmental dose-optimization program which includes automated exposure control, adjustment of the mA and/or kV according to patient size and/or use of iterative reconstruction technique. COMPARISON:  None Available. FINDINGS: Urinary Tract:  No abnormality visualized. Bowel:  Unremarkable visualized pelvic bowel loops. Vascular/Lymphatic: No pathologically enlarged lymph nodes. No significant vascular abnormality seen. Small bilateral inguinal lymph nodes, none pathologically enlarged. Reproductive: Scrotal wall edema noted. There is gas within the scrotum and lower perineum concerning for infection with gas-forming organism/necrotizing fasciitis. Other:  No free fluid.  Small umbilical hernia containing fat. Musculoskeletal: No acute bony abnormality. IMPRESSION: Edema/swelling throughout the scrotal wall. Gas noted in the scrotum and lower perineum concerning for necrotizing fasciitis. These results were called by telephone at the time of interpretation on 08/28/2024 at 6:03 pm to provider WHITNEY PLUNKETT , who verbally acknowledged these results. Electronically Signed   By: Franky Crease M.D.   On: 08/28/2024 18:04    Anti-infectives: Anti-infectives (From admission, onward)    Start     Dose/Rate Route Frequency Ordered Stop   08/30/24 1000  [MAR Hold]  linezolid (ZYVOX) IVPB 600  mg        (MAR Hold since Sun 08/30/2024 at 1104.Hold Reason: Transfer to a Procedural area)   600 mg 300 mL/hr over 60 Minutes Intravenous Every 12 hours 08/29/24 1349     08/29/24 0600  vancomycin (VANCOREADY) IVPB 1500 mg/300 mL  Status:  Discontinued        1,500 mg 150 mL/hr over 120 Minutes Intravenous Every 48 hours 08/29/24 0100 08/29/24 1404   08/29/24 0400  [MAR Hold]   ceFEPIme (MAXIPIME) 2 g in sodium chloride  0.9 % 100 mL IVPB        (MAR Hold since Sun 08/30/2024 at 1104.Hold Reason: Transfer to a Procedural area)   2 g 200 mL/hr over 30 Minutes Intravenous Every 24 hours 08/29/24 0100     08/29/24 0030  [MAR Hold]  metroNIDAZOLE (FLAGYL) IVPB 500 mg        (MAR Hold since Sun 08/30/2024 at 1104.Hold Reason: Transfer to a Procedural area)   500 mg 100 mL/hr over 60 Minutes Intravenous Every 12 hours 08/29/24 0025     08/28/24 1815  piperacillin-tazobactam (ZOSYN) IVPB 3.375 g        3.375 g 100 mL/hr over 30 Minutes Intravenous  Once 08/28/24 1805 08/28/24 1908   08/28/24 1815  linezolid (ZYVOX) IVPB 600 mg        600 mg 300 mL/hr over 60 Minutes Intravenous  Once 08/28/24 1805 08/28/24 1923   08/28/24 1730  cefTRIAXone (ROCEPHIN) 2 g in sodium chloride  0.9 % 100 mL IVPB        2 g 200 mL/hr over 30 Minutes Intravenous  Once 08/28/24 1724 08/28/24 1821   08/28/24 1715  cefTRIAXone (ROCEPHIN) injection 2 g  Status:  Discontinued        2 g Intramuscular  Once 08/28/24 1705 08/28/24 1724       Assessment/Plan 68 y/o M with NSTI s/p debridement of the perineum 10/10  - Will RTOR today to evaluate the wound and possibly place a vac. All of his questions were addressed and written consent was obtained   LOS: 2 days   Cordella DELENA Polly Marlis Cheron Surgery 08/30/2024, 11:25 AM Please see Amion for pager number during day hours 7:00am-4:30pm or 7:00am -11:30am on weekends

## 2024-08-30 NOTE — Progress Notes (Signed)
 Patient ID: Brendan Matherne., male   DOB: 08-05-1956, 69 y.o.   MRN: [id number]  2 Days Post-Op Subjective: Pt without new complaints.  Objective: Vital signs in last 24 hours: Temp:  [95.9 F (35.5 C)-97.8 F (36.6 C)] 96.3 F (35.7 C) (10/12 0759) Pulse Rate:  [54-80] 54 (10/12 0800) Resp:  [8-22] 8 (10/12 0800) BP: (114-178)/(63-103) 139/70 (10/12 0800) SpO2:  [96 %-100 %] 98 % (10/12 0800)  Intake/Output from previous day: 10/11 0701 - 10/12 0700 In: 1873.6 [P.O.:840; I.V.:720.2; IV Piggyback:313.4] Out: 1500 [Urine:1500] Intake/Output this shift: Total I/O In: 171.1 [P.O.:25; I.V.:46.1; IV Piggyback:100] Out: -   Physical Exam:  General: Alert and oriented GU: Scrotum still edematous but without evidence of necrosis, crepitus, or fluctuance.  Skin edges appear viable.    Lab Results: Recent Labs    08/28/24 1613 08/29/24 0329  HGB 11.7* 11.0*  HCT 34.4* 32.3*      Latest Ref Rng & Units 08/29/2024    3:29 AM 08/28/2024    4:13 PM 03/10/2024   12:13 PM  CBC  WBC 4.0 - 10.5 K/uL 26.3  22.2  12.1   Hemoglobin 13.0 - 17.0 g/dL 88.9  88.2  87.7   Hematocrit 39.0 - 52.0 % 32.3  34.4  36.5   Platelets 150 - 400 K/uL 327  347  309      BMET Recent Labs    08/28/24 1613 08/29/24 0326  NA 135 135  K 4.5 4.6  CL 99 100  CO2 22 21*  GLUCOSE 177* 195*  BUN 41* 41*  CREATININE 3.79* 3.55*  CALCIUM 9.2 8.3*   PSA < 0.02  Studies/Results:   Assessment/Plan: 1) Prostate cancer:  PSA is undetectable.  Will continue with annual surveillance. 2) Necrotizing fasciitis: No obvious further involvement of scrotum.  Will be available if needed.  Please call if GU involvement is needed intraoperatively or otherwise.  Will peripherally follow for now.   LOS: 2 days   Brendan [name] 08/30/2024, 8:54 AM

## 2024-08-30 NOTE — Op Note (Signed)
 Preoperative diagnosis: Perineal wound  Postoperative diagnosis: same   Procedure:  Debridement of perineal wound (final dimensions: 15cm 10cm x 3cm)  Surgeon: Cordella Idler, M.D.  Asst: None  Anesthesia: General  Indications for procedure: Brendan [name]. is a 68 y.o. year old male who presented with concern for a necrotizing infection of the perineum. He was taken urgent for debridement. He underwent several bedside dressing changes after surgery. I recommended that we proceed to the OR for evaluation of the wound and possible vac placement.   Description of procedure: The patient was brought into the operative suite. Anesthesia was administered with General endotracheal anesthesia. WHO checklist was applied. The wound packing was removed. The patient was then placed in lithotomy position. The area was prepped and draped in the usual sterile fashion.  I examined the wound and noted that it appeared relatively healthy. There was a few small areas of necrotic tissue at the baseline of the wound. These were excised sharply with scissors. The underlying tissue appeared healthy and was bleeding. The remainder of the wound appeared healthy. The final wound measured 15cm x 10cm x 3cm. I then brought a wound vac to the field and attempted placement. Unfortunately, I was unable to get a seal along the deepest aspect because of the proximity to the anus. The wound was packed with kerlix and covered with a dressing. He tolerated the procedure well and was brought to the PACU in stable condition.   Findings: A few small areas of necrosis. Otherwise healthy appearing wound.   Specimen: None  Implant: None   Blood loss: Minimal   Complications: None  Cordella Idler, M.D. General Surgery [facility], [place]

## 2024-08-30 NOTE — Transfer of Care (Signed)
 Immediate Anesthesia Transfer of Care Note  Patient: Brendan [name].  Procedure(s) Performed: INCISION AND DRAINAGE OF PERINEAL WOUND (Perineum)  Patient Location: PACU  Anesthesia Type:General  Level of Consciousness: awake and alert   Airway & Oxygen Therapy: Patient Spontanous Breathing and Patient connected to face mask oxygen  Post-op Assessment: Report given to RN and Post -op Vital signs reviewed and stable  Post vital signs: Reviewed and stable  Last Vitals:  Vitals Value Taken Time  BP 134/82 08/30/24 14:15  Temp    Pulse 53 08/30/24 14:20  Resp 8 08/30/24 14:20  SpO2 99 % 08/30/24 14:20  Vitals shown include unfiled device data.  Last Pain:  Vitals:   08/30/24 1400  TempSrc:   PainSc: 10-Worst pain ever      Patients Stated Pain Goal: 2 (08/29/24 0801)  Complications: No notable events documented.

## 2024-08-30 NOTE — Progress Notes (Signed)
 eLink Physician-Brief Progress Note Patient Name: Brendan [name]. DOB: Oct 30, 1956 MRN: [id number]   Date of Service  08/30/2024  HPI/Events of Note  Downtrending CBGs  eICU Interventions  Add low-dose dextrose  infusion      Intervention Category Major Interventions: Hyperglycemia - active titration of insulin  therapy  Sawsan Riggio 08/30/2024, 5:05 AM

## 2024-08-30 NOTE — Progress Notes (Signed)
 Patient picked up to go to short stay pre procedure.

## 2024-08-30 NOTE — Anesthesia Postprocedure Evaluation (Signed)
 Anesthesia Post Note  Patient: Brendan [name].  Procedure(s) Performed: INCISION AND DRAINAGE OF PERINEAL WOUND (Perineum)     Patient location during evaluation: PACU Anesthesia Type: General Level of consciousness: awake and alert, oriented and patient cooperative Pain management: pain level controlled Vital Signs Assessment: post-procedure vital signs reviewed and stable Respiratory status: spontaneous breathing, nonlabored ventilation and respiratory function stable Cardiovascular status: blood pressure returned to baseline and stable Postop Assessment: no apparent nausea or vomiting Anesthetic complications: no   No notable events documented.  Last Vitals:  Vitals:   08/30/24 1430 08/30/24 1445  BP: 126/72 121/74  Pulse: (!) 52 (!) 52  Resp: (!) 8 10  Temp: 36.4 C   SpO2: 94% 94%    Last Pain:  Vitals:   08/30/24 1415  TempSrc:   PainSc: 6                  Eleen Litz,E. Jonus Coble

## 2024-08-30 NOTE — Progress Notes (Signed)
 PROGRESS NOTE  [name] FMW:[id number] DOB: 1956-01-22 DOA: 08/28/2024 PCP: Rexanne Ingle, MD   LOS: 2 days   Brief Narrative / Interim history: 68 year old male with history of DM2, HTN, hypothyroidism, OSA, obesity, CKD 4, chronic diastolic CHF who came into the hospital with groin pain and swelling.  He had a boil behind his testicles which started draining on 10/9, and this was associated with scrotal and penile swelling.  He was found to have necrotizing fasciitis, general surgery as well as urology was consulted and emergently was taken to the OR upon presentation on 10/10 and was monitored in the ICU afterwards.  Upon stability he was transferred to the hospitalist service on 10/12.  Subjective / 24h Interval events: He is doing well this morning, denies any significant pain.  No abdominal discomfort, no nausea or vomiting.  Assesement and Plan: Principal problem Necrotizing fasciitis of the scrotum/perineal area -appreciate general surgery and urology input.  He was initially taken to the OR on 10/10 for I&D, will go back to the OR today - Has been placed on broad-spectrum antibiotics with cefepime, linezolid as well as metronidazole, continue.  Preliminary cultures with GPC and GNR's, final speciation and sensitivities pending  Active problems DM 2, poorly controlled, with hyper and hypoglycemia -patient hyperglycemic in the 300s yesterday, glargine was added and he is hypoglycemic this morning.  Discontinue glargine, he is n.p.o. for surgery today, will monitor CBGs postoperatively and adjust insulin  as indicated  Hypothyroidism-continue Synthroid   Essential hypertension-continue Coreg , blood pressure is stable  AKI on CKD stage IV -baseline creatinine earlier this year 2.8-3.0.  Creatinine during this admission was 3.79 in the setting of active infection, slightly improved to 3.5 this morning  Chronic diastolic CHF -euvolemic  OSA -continue home nightly  CPAP  Obesity, class I-BMI borderline at 30.6  Scheduled Meds:  carvedilol   25 mg Oral BID WC   Chlorhexidine  Gluconate Cloth  6 each Topical Daily   dorzolamide  1 drop Both Eyes Daily   heparin  injection (subcutaneous)  5,000 Units Subcutaneous Q8H   insulin  aspart  0-20 Units Subcutaneous Q4H   levothyroxine   125 mcg Oral Q0600   timolol   1 drop Both Eyes Daily   Continuous Infusions:  ceFEPime (MAXIPIME) IV Stopped (08/30/24 0340)   dextrose  40 mL/hr at 08/30/24 0830   linezolid (ZYVOX) IV 600 mg (08/30/24 0955)   metronidazole 500 mg (08/30/24 0944)   PRN Meds:.morphine injection, ondansetron  (ZOFRAN ) IV, mouth rinse  Current Outpatient Medications  Medication Instructions   acetaminophen  (TYLENOL ) 500 mg, Oral, Every 6 hours PRN   carvedilol  (COREG ) 25 MG tablet Take 1 tablet (25 mg total) by mouth 2 (two) times daily with food   clobetasol  ointment (TEMOVATE ) 0.05 % Apply topically daily before sleeping.   Continuous Glucose Sensor (FREESTYLE LIBRE 3 PLUS SENSOR) MISC Apply to upper back of arm. Change every 15 days.   Continuous Glucose Sensor (FREESTYLE LIBRE 3 SENSOR) MISC Apply to upper back of arm and change ever 14 days.   dorzolamide (TRUSOPT) 2 % ophthalmic solution 1 drop, Both Eyes, Daily   Farxiga  10 mg, Oral, Daily   hydrALAZINE  (APRESOLINE ) 100 MG tablet Take 1/2 tablet (50 mg total) by mouth 2 (two) times daily.   insulin  lispro (HUMALOG  KWIKPEN) 200 UNIT/ML KwikPen Inject under the skin 12 units at small meals and 24 units at regular meals  3 times a day   Insulin  Pen Needle (TECHLITE PEN NEEDLES) 32G X 4 MM MISC Use to  inject insulin  4 times daily   Insulin  Pen Needle 32G X 4 MM MISC Use to inject insulin  up to 4 times daily.   Kerendia  20 mg, Oral, Daily   Lantus  SoloStar 30 Units, Subcutaneous, Daily, Titrate when directed. Max daily dose of 50 units.   levothyroxine  (SYNTHROID ) 125 MCG tablet Take 1 tablet (125 mcg total) by mouth every morning on an  empty stomach   Mounjaro  12.5 mg, Subcutaneous, Weekly   Mounjaro  15 mg, Subcutaneous, Weekly   potassium chloride  SA (KLOR-CON  M) 20 MEQ tablet 60 mEq, Oral, Daily   sacubitril -valsartan  (ENTRESTO ) 49-51 MG 1 tablet, Oral, 2 times daily   simvastatin  (ZOCOR ) 20 mg, Oral, Every evening   timolol  (BETIMOL ) 0.5 % ophthalmic solution 1 drop, Both Eyes, Daily   torsemide  (DEMADEX ) 60 mg, Oral, Daily    Diet Orders (From admission, onward)     Start     Ordered   08/30/24 0001  Diet NPO time specified  Diet effective midnight        08/29/24 0744            DVT prophylaxis: heparin  injection 5,000 Units Start: 08/29/24 0600 SCDs Start: 08/29/24 0023   Lab Results  Component Value Date   PLT 327 08/29/2024      Code Status: Full Code  Family Communication: No family present at bedside  Status is: Inpatient Remains inpatient appropriate because: Severity of illness   Level of care: Telemetry Medical  Consultants:  PCCM General Surgery Urology  Objective: Vitals:   08/30/24 0700 08/30/24 0759 08/30/24 0800 08/30/24 0900  BP: 124/70 139/70 139/70 133/75  Pulse: (!) 55 (!) 55 (!) 54 (!) 56  Resp: (!) 8 (!) 8 (!) 8 (!) 8  Temp:  (!) 96.3 F (35.7 C)    TempSrc:  Axillary    SpO2: 98% 99% 98% 98%  Weight:      Height:        Intake/Output Summary (Last 24 hours) at 08/30/2024 1006 Last data filed at 08/30/2024 0800 Gross per 24 hour  Intake 1684.67 ml  Output 1050 ml  Net 634.67 ml   Wt Readings from Last 3 Encounters:  08/29/24 108.1 kg  08/10/24 127.9 kg  03/10/24 133.9 kg    Examination:  Constitutional: NAD Eyes: no scleral icterus ENMT: Mucous membranes are moist.  Neck: normal, supple Respiratory: clear to auscultation bilaterally, no wheezing, no crackles.  Cardiovascular: Regular rate and rhythm, no murmurs / rubs / gallops.  Trace LE edema.  Abdomen: non distended, no tenderness. Bowel sounds positive.  Musculoskeletal: no clubbing /  cyanosis.    Data Reviewed: I have independently reviewed following labs and imaging studies   CBC Recent Labs  Lab 08/28/24 1613 08/29/24 0329  WBC 22.2* 26.3*  HGB 11.7* 11.0*  HCT 34.4* 32.3*  PLT 347 327  MCV 83.7 84.6  MCH 28.5 28.8  MCHC 34.0 34.1  RDW 15.1 14.8  LYMPHSABS 1.0  --   MONOABS 1.8*  --   EOSABS 1.2*  --   BASOSABS 0.1  --     Recent Labs  Lab 08/28/24 1613 08/28/24 1822 08/29/24 0326 08/29/24 0329  NA 135  --  135  --   K 4.5  --  4.6  --   CL 99  --  100  --   CO2 22  --  21*  --   GLUCOSE 177*  --  195*  --   BUN 41*  --  41*  --  CREATININE 3.79*  --  3.55*  --   CALCIUM 9.2  --  8.3*  --   AST 16  --   --   --   ALT 17  --   --   --   ALKPHOS 98  --   --   --   BILITOT 0.5  --   --   --   ALBUMIN 3.7  --   --   --   MG  --   --  1.6*  --   LATICACIDVEN 2.4* 1.4  --   --   INR 1.0  --   --   --   HGBA1C  --   --   --  8.4*    ------------------------------------------------------------------------------------------------------------------ No results for input(s): CHOL, HDL, LDLCALC, TRIG, CHOLHDL, LDLDIRECT in the last 72 hours.  Lab Results  Component Value Date   HGBA1C 8.4 (H) 08/29/2024   ------------------------------------------------------------------------------------------------------------------ No results for input(s): TSH, T4TOTAL, T3FREE, THYROIDAB in the last 72 hours.  Invalid input(s): FREET3  Cardiac Enzymes No results for input(s): CKMB, TROPONINI, MYOGLOBIN in the last 168 hours.  Invalid input(s): CK ------------------------------------------------------------------------------------------------------------------    Component Value Date/Time   BNP 295.6 (H) 01/20/2024 1042    CBG: Recent Labs  Lab 08/30/24 0311 08/30/24 0419 08/30/24 0734 08/30/24 0757 08/30/24 0947  GLUCAP 89 70 85 77 84    Recent Results (from the past 240 hours)  Culture, blood (Routine x 2)      Status: None (Preliminary result)   Collection Time: 08/28/24  4:15 PM   Specimen: BLOOD LEFT FOREARM  Result Value Ref Range Status   Specimen Description   Final    BLOOD LEFT FOREARM Performed at [facility], [address], [place], [place] [address]    Special Requests   Final    BOTTLES DRAWN AEROBIC AND ANAEROBIC Blood Culture adequate volume Performed at [facility], [address], [place], [place] [address]    Culture   Final    NO GROWTH 2 DAYS Performed at [facility], [address], [place], [place] [address]    Report Status PENDING  Incomplete  Culture, blood (Routine x 2)     Status: None (Preliminary result)   Collection Time: 08/28/24  4:16 PM   Specimen: BLOOD RIGHT FOREARM  Result Value Ref Range Status   Specimen Description   Final    BLOOD RIGHT FOREARM Performed at [facility], [address], [place], [place] [address]    Special Requests   Final    BOTTLES DRAWN AEROBIC AND ANAEROBIC Blood Culture adequate volume Performed at [facility], [address], [place], [place] [address]    Culture   Final    NO GROWTH 2 DAYS Performed at [facility], [address] [place], [place] [address]    Report Status PENDING  Incomplete  Aerobic/Anaerobic Culture w Gram Stain (surgical/deep wound)     Status: None (Preliminary result)   Collection Time: 08/28/24 10:16 PM   Specimen: Path Tissue  Result Value Ref Range Status   Specimen Description TISSUE  Final   Special Requests PERINEUM TISSUE CULTURE SPEC A  Final   Gram Stain   Final    FEW WBC PRESENT, PREDOMINANTLY PMN MODERATE GRAM POSITIVE COCCI RARE GRAM NEGATIVE RODS    Culture   Final    CULTURE REINCUBATED FOR BETTER GROWTH Performed at [facility], [address],  [place], [place] [address]    Report Status PENDING  Incomplete  MRSA Next Gen by PCR, Nasal     Status: None   Collection Time: 08/28/24 11:42 PM    Specimen: Nasal Mucosa; Nasal Swab  Result Value Ref Range Status   MRSA by PCR Next Gen NOT DETECTED NOT DETECTED Final    Comment: (NOTE) The GeneXpert MRSA Assay (FDA approved for NASAL specimens only), is one component of a comprehensive MRSA colonization surveillance program. It is not intended to diagnose MRSA infection nor to guide or monitor treatment for MRSA infections. Test performance is not FDA approved in patients less than 72 years old. Performed at [facility], [address], [place], [place] [address]   Culture, blood (Routine X 2) w Reflex to ID Panel     Status: None (Preliminary result)   Collection Time: 08/29/24  3:29 AM   Specimen: BLOOD  Result Value Ref Range Status   Specimen Description BLOOD SITE NOT SPECIFIED  Final   Special Requests   Final    BOTTLES DRAWN AEROBIC AND ANAEROBIC Blood Culture results may not be optimal due to an inadequate volume of blood received in culture bottles   Culture   Final    NO GROWTH 1 DAY Performed at [facility], [address] [place], [place] [address]    Report Status PENDING  Incomplete  Culture, blood (Routine X 2) w Reflex to ID Panel     Status: None (Preliminary result)   Collection Time: 08/29/24  3:31 AM   Specimen: BLOOD  Result Value Ref Range Status   Specimen Description BLOOD SITE NOT SPECIFIED  Final   Special Requests   Final    BOTTLES DRAWN AEROBIC AND ANAEROBIC Blood Culture results may not be optimal due to an inadequate volume of blood received in culture bottles   Culture   Final    NO GROWTH 1 DAY Performed at [facility], [address], [place], [place] [address]    Report Status PENDING  Incomplete     Radiology Studies: No results found.   Nilda Fendt, MD, PhD [facility]  Between 7 am - 7 pm I am available, please contact me via Amion (for emergencies) or Securechat (non urgent messages)  Between 7 pm - 7 am I am not available, please contact  night coverage MD/APP via Amion

## 2024-08-30 NOTE — Anesthesia Preprocedure Evaluation (Signed)
 Anesthesia Evaluation  Patient identified by MRN, date of birth, ID band Patient awake    Reviewed: Allergy & Precautions, NPO status , Patient's Chart, lab work & pertinent test results  History of Anesthesia Complications Negative for: history of anesthetic complications  Airway Mallampati: II  TM Distance: >3 FB Neck ROM: Full    Dental  (+) Dental Advisory Given   Pulmonary sleep apnea and Continuous Positive Airway Pressure Ventilation    breath sounds clear to auscultation       Cardiovascular hypertension, Pt. on medications and Pt. on home beta blockers +CHF (Entresto )   Rhythm:Regular Rate:Normal  04/2024 ECHO: EF 60 to 65%. 1. The LV has normal function, no regional wall motion abnormalities. There is mild concentric LVH. Grade I diastolic dysfunction (impaired relaxation).   2. RVF is normal. The right ventricular size is normal.   3. Left atrial size was mildly dilated.   4. Right atrial size was mildly dilated.   5. The mitral valve is normal in structure. Trivial mitral valve regurgitation. No evidence of mitral stenosis.   6. The aortic valve is tricuspid. There is mild calcification of the aortic valve. Aortic valve regurgitation is not visualized. Aortic valve sclerosis/calcification is present, without any evidence of aortic stenosis.       Neuro/Psych    GI/Hepatic H/o lap band   Endo/Other  diabetes (glu 126), Insulin  DependentHypothyroidism  Mounjaro : last dose 08/25/2024  Renal/GU Renal InsufficiencyRenal disease     Musculoskeletal   Abdominal   Peds  Hematology  (+) Blood dyscrasia, Sickle cell trait Hb 11.0, plt 327k   Anesthesia Other Findings H/o prostate cancer  Reproductive/Obstetrics                              Anesthesia Physical Anesthesia Plan  ASA: 3  Anesthesia Plan: General   Post-op Pain Management: Tylenol  PO (pre-op)*   Induction:  Intravenous  PONV Risk Score and Plan: 2 and Ondansetron  and Dexamethasone  Airway Management Planned: Oral ETT  Additional Equipment: None  Intra-op Plan:   Post-operative Plan: Extubation in OR  Informed Consent: I have reviewed the patients History and Physical, chart, labs and discussed the procedure including the risks, benefits and alternatives for the proposed anesthesia with the patient or authorized representative who has indicated his/her understanding and acceptance.     Dental advisory given  Plan Discussed with: CRNA and Surgeon  Anesthesia Plan Comments:         Anesthesia Quick Evaluation

## 2024-08-31 ENCOUNTER — Encounter (HOSPITAL_COMMUNITY): Payer: Self-pay | Admitting: General Surgery

## 2024-08-31 DIAGNOSIS — M726 Necrotizing fasciitis: Secondary | ICD-10-CM | POA: Diagnosis not present

## 2024-08-31 LAB — COMPREHENSIVE METABOLIC PANEL WITH GFR
ALT: 14 U/L (ref 0–44)
AST: 14 U/L — ABNORMAL LOW (ref 15–41)
Albumin: 2.4 g/dL — ABNORMAL LOW (ref 3.5–5.0)
Alkaline Phosphatase: 71 U/L (ref 38–126)
Anion gap: 14 (ref 5–15)
BUN: 53 mg/dL — ABNORMAL HIGH (ref 8–23)
CO2: 17 mmol/L — ABNORMAL LOW (ref 22–32)
Calcium: 8.1 mg/dL — ABNORMAL LOW (ref 8.9–10.3)
Chloride: 103 mmol/L (ref 98–111)
Creatinine, Ser: 3.52 mg/dL — ABNORMAL HIGH (ref 0.61–1.24)
GFR, Estimated: 18 mL/min — ABNORMAL LOW (ref >60)
Glucose, Bld: 253 mg/dL — ABNORMAL HIGH (ref 70–99)
Potassium: 4.5 mmol/L (ref 3.5–5.1)
Sodium: 134 mmol/L — ABNORMAL LOW (ref 135–145)
Total Bilirubin: 0.7 mg/dL (ref 0.0–1.2)
Total Protein: 5.9 g/dL — ABNORMAL LOW (ref 6.5–8.1)

## 2024-08-31 LAB — GLUCOSE, CAPILLARY
Glucose-Capillary: 64 mg/dL — ABNORMAL LOW (ref 70–99)
Glucose-Capillary: 93 mg/dL (ref 70–99)
Glucose-Capillary: 232 mg/dL — ABNORMAL HIGH (ref 70–99)
Glucose-Capillary: 223 mg/dL — ABNORMAL HIGH (ref 70–99)
Glucose-Capillary: 176 mg/dL — ABNORMAL HIGH (ref 70–99)
Glucose-Capillary: 183 mg/dL — ABNORMAL HIGH (ref 70–99)
Glucose-Capillary: 223 mg/dL — ABNORMAL HIGH (ref 70–99)

## 2024-08-31 LAB — CBC
HCT: 31.9 % — ABNORMAL LOW (ref 39.0–52.0)
Hemoglobin: 10.7 g/dL — ABNORMAL LOW (ref 13.0–17.0)
MCH: 28.3 pg (ref 26.0–34.0)
MCHC: 33.5 g/dL (ref 30.0–36.0)
MCV: 84.4 fL (ref 80.0–100.0)
Platelets: 308 K/uL (ref 150–400)
RBC: 3.78 MIL/uL — ABNORMAL LOW (ref 4.22–5.81)
RDW: 15.1 % (ref 11.5–15.5)
WBC: 22.8 K/uL — ABNORMAL HIGH (ref 4.0–10.5)
nRBC: 0 % (ref 0.0–0.2)

## 2024-08-31 LAB — PHOSPHORUS: Phosphorus: 4.6 mg/dL (ref 2.5–4.6)

## 2024-08-31 LAB — MAGNESIUM: Magnesium: 2.5 mg/dL — ABNORMAL HIGH (ref 1.7–2.4)

## 2024-08-31 MED ORDER — INSULIN GLARGINE 100 UNITS/ML SOLOSTAR PEN: 8.0000 [IU] | PEN_INJECTOR | Freq: Every day | SUBCUTANEOUS | Status: DC

## 2024-08-31 MED ORDER — INSULIN GLARGINE 100 UNIT/ML ~~LOC~~ SOLN
8.0000 [IU] | Freq: Every day | SUBCUTANEOUS | Status: DC
  Administered 2024-08-31: 8 [IU] via SUBCUTANEOUS
  Filled 2024-08-31 (×2): qty 0.08

## 2024-08-31 MED ORDER — METRONIDAZOLE 500 MG PO TABS
500.0000 mg | ORAL_TABLET | Freq: Two times a day (BID) | ORAL | Status: AC
  Administered 2024-08-31 – 2024-09-01 (×4): 500 mg via ORAL
  Filled 2024-08-31 (×4): qty 1

## 2024-08-31 MED ORDER — LINEZOLID 600 MG PO TABS
600.0000 mg | ORAL_TABLET | Freq: Two times a day (BID) | ORAL | Status: DC
  Administered 2024-08-31 – 2024-09-02 (×5): 600 mg via ORAL
  Filled 2024-08-31 (×5): qty 1

## 2024-08-31 NOTE — Progress Notes (Signed)
 Progress Note  1 Day Post-Op  Subjective: Patient reports that pain is manageable. Reports that he feels that he could have a bowel movement. Having flatulence. Had 1 episode of nausea and vomiting after procedure. Denies nausea and vomiting since that time.   ROS  Per HPI  Objective: Vital signs in last 24 hours: Temp:  [96.4 F (35.8 C)-98.4 F (36.9 C)] 98.4 F (36.9 C) (10/13 0808) Pulse Rate:  [52-77] 68 (10/13 0808) Resp:  [5-20] 19 (10/13 0808) BP: (108-155)/(59-87) 108/59 (10/13 0808) SpO2:  [94 %-100 %] 100 % (10/13 0808) Weight:  [108.1 kg] 108.1 kg (10/12 1111) Last BM Date : 08/29/24  Intake/Output from previous day: 10/12 0701 - 10/13 0700 In: 911.1 [P.O.:265; I.V.:546.1; IV Piggyback:100] Out: 810 [Urine:800; Blood:10] Intake/Output this shift: No intake/output data recorded.  PE: Physical exam completed with nursing staff present. General: Pleasant male who is laying in bed in NAD. HEENT: Head is normocephalic, atraumatic.  Heart: HR normal. Lungs: Respiratory effort nonlabored. Skin: Scrotal/perineal wound present. Packing removed. Some purulent drainage noted and fibrinous exudate present.    Psych: A&Ox3 with an appropriate affect.    Lab Results:  Recent Labs    08/29/24 0329 08/31/24 0426  WBC 26.3* 22.8*  HGB 11.0* 10.7*  HCT 32.3* 31.9*  PLT 327 308   BMET Recent Labs    08/29/24 0326 08/31/24 0426  NA 135 134*  K 4.6 4.5  CL 100 103  CO2 21* 17*  GLUCOSE 195* 253*  BUN 41* 53*  CREATININE 3.55* 3.52*  CALCIUM 8.3* 8.1*   PT/INR Recent Labs    08/28/24 1613  LABPROT 13.9  INR 1.0   CMP     Component Value Date/Time   NA 134 (L) 08/31/2024 0426   NA 140 09/03/2022 0849   K 4.5 08/31/2024 0426   CL 103 08/31/2024 0426   CO2 17 (L) 08/31/2024 0426   GLUCOSE 253 (H) 08/31/2024 0426   BUN 53 (H) 08/31/2024 0426   BUN 26 09/03/2022 0849   CREATININE 3.52 (H) 08/31/2024 0426   CALCIUM 8.1 (L) 08/31/2024 0426    PROT 5.9 (L) 08/31/2024 0426   ALBUMIN 2.4 (L) 08/31/2024 0426   AST 14 (L) 08/31/2024 0426   ALT 14 08/31/2024 0426   ALKPHOS 71 08/31/2024 0426   BILITOT 0.7 08/31/2024 0426   GFRNONAA 18 (L) 08/31/2024 0426   GFRAA 29 (L) 08/25/2020 1144   Lipase  No results found for: LIPASE     Studies/Results: No results found.  Anti-infectives: Anti-infectives (From admission, onward)    Start     Dose/Rate Route Frequency Ordered Stop   08/31/24 1030  metroNIDAZOLE (FLAGYL) tablet 500 mg        500 mg Oral Every 12 hours 08/31/24 0933     08/31/24 1030  linezolid (ZYVOX) tablet 600 mg        600 mg Oral Every 12 hours 08/31/24 0933     08/30/24 1000  linezolid (ZYVOX) IVPB 600 mg  Status:  Discontinued        600 mg 300 mL/hr over 60 Minutes Intravenous Every 12 hours 08/29/24 1349 08/31/24 0933   08/29/24 0600  vancomycin (VANCOREADY) IVPB 1500 mg/300 mL  Status:  Discontinued        1,500 mg 150 mL/hr over 120 Minutes Intravenous Every 48 hours 08/29/24 0100 08/29/24 1404   08/29/24 0400  ceFEPIme (MAXIPIME) 2 g in sodium chloride  0.9 % 100 mL IVPB  2 g 200 mL/hr over 30 Minutes Intravenous Every 24 hours 08/29/24 0100     08/29/24 0030  metroNIDAZOLE (FLAGYL) IVPB 500 mg  Status:  Discontinued        500 mg 100 mL/hr over 60 Minutes Intravenous Every 12 hours 08/29/24 0025 08/31/24 0933   08/28/24 1815  piperacillin-tazobactam (ZOSYN) IVPB 3.375 g        3.375 g 100 mL/hr over 30 Minutes Intravenous  Once 08/28/24 1805 08/28/24 1908   08/28/24 1815  linezolid (ZYVOX) IVPB 600 mg        600 mg 300 mL/hr over 60 Minutes Intravenous  Once 08/28/24 1805 08/28/24 1923   08/28/24 1730  cefTRIAXone (ROCEPHIN) 2 g in sodium chloride  0.9 % 100 mL IVPB        2 g 200 mL/hr over 30 Minutes Intravenous  Once 08/28/24 1724 08/28/24 1821   08/28/24 1715  cefTRIAXone (ROCEPHIN) injection 2 g  Status:  Discontinued        2 g Intramuscular  Once 08/28/24 1705 08/28/24 1724         Assessment/Plan POD 1: S/P debridement of perineal wound 08/30/2024 by Dr. Cordella Idler -Afebrile -WBC 22.8 from 26.3 -Pain is manageable. No concerns of developing/remaining necrotic tissue. -Continue with dressing changes BID or when soiled. -Continue antibiotics per primary team. -Urology has signed off.  -Will continue to follow.  FEN: CM; IVF per primary team VTE: SCDs, Heparin  injeection ID: Cefepime, linezolid, metronidazole    LOS: 3 days   I reviewed specialist notes, hospitalist notes, last 24 h vitals and pain scores, last 48 h intake and output, last 24 h labs and trends, and last 24 h imaging results.   Marjorie Carlyon Favre, [facility] Surgery 08/31/2024, 10:15 AM Please see Amion for pager number during day hours 7:00am-4:30pm

## 2024-08-31 NOTE — Progress Notes (Signed)
 Patient ID: Brendan [name]., male   DOB: 07/14/1956, 68 y.o.   MRN: [id number]  Taken to OR by General Surgery yesterday.  Wound VAC placed.  No need for GU involvement.  Will arrange f/u for prostate cancer in one year.  Please call if we can be of further assistance.

## 2024-08-31 NOTE — Progress Notes (Addendum)
 PROGRESS NOTE  Brendan [name]. FMW:[id number] DOB: 09/14/1956 DOA: 08/28/2024 PCP: Rexanne Ingle, MD   LOS: 3 days   Brief Narrative / Interim history: 68 year old male with history of DM2, HTN, hypothyroidism, OSA, obesity, CKD 4, chronic diastolic CHF who came into the hospital with groin pain and swelling.  He had a boil behind his testicles which started draining on 10/9, and this was associated with scrotal and penile swelling.  He was found to have necrotizing fasciitis, general surgery as well as urology was consulted and emergently was taken to the OR upon presentation on 10/10 and was monitored in the ICU afterwards.  Upon stability he was transferred to the hospitalist service on 10/12.  Subjective / 24h Interval events: No pain.  Reports significant discomfort with dressing changes  Assesement and Plan: Principal problem Sepsis due to necrotizing fasciitis of the scrotum/perineal area -appreciate general surgery and urology input.  He was initially taken to the OR on 10/10 for I&D, will go back to the OR today - Has been placed on broad-spectrum antibiotics with cefepime, linezolid as well as metronidazole, continue.  Preliminary cultures with GPC and GNR's, final speciation and sensitivities still pending this morning  Active problems DM 2, poorly controlled, with hyper and hypoglycemia -he was hypoglycemic 10/12 and Lantus  was discontinued, however CBGs are high again today.  Reintroduce Lantus  at a lower dose.  Hypothyroidism-continue Synthroid   Essential hypertension-continue Coreg , blood pressure is stable  AKI on CKD stage IV -baseline creatinine earlier this year 2.8-3.0.  Creatinine during this admission was 3.79 in the setting of active infection, stable around 1.3 now, may be a new baseline  Chronic diastolic CHF -euvolemic  OSA -continue home nightly CPAP  Obesity, class I-BMI borderline at 30.6  Scheduled Meds:  carvedilol   25 mg Oral BID WC   Chlorhexidine   Gluconate Cloth  6 each Topical Daily   dorzolamide  1 drop Both Eyes Daily   heparin  injection (subcutaneous)  5,000 Units Subcutaneous Q8H   insulin  aspart  0-20 Units Subcutaneous Q4H   insulin  glargine  8 Units Subcutaneous Daily   levothyroxine   125 mcg Oral Q0600   linezolid  600 mg Oral Q12H   metroNIDAZOLE  500 mg Oral Q12H   timolol   1 drop Both Eyes Daily   Continuous Infusions:  ceFEPime (MAXIPIME) IV 2 g (08/31/24 0350)   PRN Meds:.morphine injection, ondansetron  (ZOFRAN ) IV, mouth rinse  Current Outpatient Medications  Medication Instructions   acetaminophen  (TYLENOL ) 500 mg, Oral, Every 6 hours PRN   carvedilol  (COREG ) 25 MG tablet Take 1 tablet (25 mg total) by mouth 2 (two) times daily with food   clobetasol  ointment (TEMOVATE ) 0.05 % Apply topically daily before sleeping.   Continuous Glucose Sensor (FREESTYLE LIBRE 3 PLUS SENSOR) MISC Apply to upper back of arm. Change every 15 days.   Continuous Glucose Sensor (FREESTYLE LIBRE 3 SENSOR) MISC Apply to upper back of arm and change ever 14 days.   dorzolamide (TRUSOPT) 2 % ophthalmic solution 1 drop, Both Eyes, Daily   Farxiga  10 mg, Oral, Daily   hydrALAZINE  (APRESOLINE ) 100 MG tablet Take 1/2 tablet (50 mg total) by mouth 2 (two) times daily.   insulin  lispro (HUMALOG  KWIKPEN) 200 UNIT/ML KwikPen Inject under the skin 12 units at small meals and 24 units at regular meals  3 times a day   Insulin  Pen Needle (TECHLITE PEN NEEDLES) 32G X 4 MM MISC Use to inject insulin  4 times daily   Insulin  Pen Needle  32G X 4 MM MISC Use to inject insulin  up to 4 times daily.   Kerendia  20 mg, Oral, Daily   Lantus  SoloStar 30 Units, Subcutaneous, Daily, Titrate when directed. Max daily dose of 50 units.   levothyroxine  (SYNTHROID ) 125 MCG tablet Take 1 tablet (125 mcg total) by mouth every morning on an empty stomach   Mounjaro  12.5 mg, Subcutaneous, Weekly   Mounjaro  15 mg, Subcutaneous, Weekly   potassium chloride  SA (KLOR-CON  M)  20 MEQ tablet 60 mEq, Oral, Daily   sacubitril -valsartan  (ENTRESTO ) 49-51 MG 1 tablet, Oral, 2 times daily   simvastatin  (ZOCOR ) 20 mg, Oral, Every evening   timolol  (BETIMOL ) 0.5 % ophthalmic solution 1 drop, Both Eyes, Daily   torsemide  (DEMADEX ) 60 mg, Oral, Daily    Diet Orders (From admission, onward)     Start     Ordered   08/30/24 1312  Diet Carb Modified Fluid consistency: Thin; Room service appropriate? Yes  Diet effective now       Question Answer Comment  Diet-HS Snack? Nothing   Calorie Level Medium 1600-2000   Fluid consistency: Thin   Room service appropriate? Yes      08/30/24 1312            DVT prophylaxis: heparin  injection 5,000 Units Start: 08/29/24 0600 SCDs Start: 08/29/24 0023   Lab Results  Component Value Date   PLT 308 08/31/2024      Code Status: Full Code  Family Communication: No family present at bedside  Status is: Inpatient Remains inpatient appropriate because: Severity of illness   Level of care: Telemetry Medical  Consultants:  PCCM General Surgery Urology  Objective: Vitals:   08/30/24 2116 08/31/24 0131 08/31/24 0555 08/31/24 0808  BP: (!) 154/87 (!) 142/71 132/64 (!) 108/59  Pulse: 77 72 65 68  Resp: 20 18 20 19   Temp: (!) 97 F (36.1 C) (!) 97.3 F (36.3 C) (!) 97.3 F (36.3 C) 98.4 F (36.9 C)  TempSrc:      SpO2: 96% 100% 100% 100%  Weight:      Height:        Intake/Output Summary (Last 24 hours) at 08/31/2024 0958 Last data filed at 08/31/2024 0350 Gross per 24 hour  Intake 740 ml  Output 810 ml  Net -70 ml   Wt Readings from Last 3 Encounters:  08/30/24 108.1 kg  08/10/24 127.9 kg  03/10/24 133.9 kg    Examination: Constitutional: NAD Eyes: lids and conjunctivae normal, no scleral icterus ENMT: mmm Neck: normal, supple Respiratory: clear to auscultation bilaterally, no wheezing, no crackles.  Cardiovascular: Regular rate and rhythm, no murmurs / rubs / gallops. No LE edema. Abdomen:  soft, no distention, no tenderness. Bowel sounds positive.    Data Reviewed: I have independently reviewed following labs and imaging studies   CBC Recent Labs  Lab 08/28/24 1613 08/29/24 0329 08/31/24 0426  WBC 22.2* 26.3* 22.8*  HGB 11.7* 11.0* 10.7*  HCT 34.4* 32.3* 31.9*  PLT 347 327 308  MCV 83.7 84.6 84.4  MCH 28.5 28.8 28.3  MCHC 34.0 34.1 33.5  RDW 15.1 14.8 15.1  LYMPHSABS 1.0  --   --   MONOABS 1.8*  --   --   EOSABS 1.2*  --   --   BASOSABS 0.1  --   --     Recent Labs  Lab 08/28/24 1613 08/28/24 1822 08/29/24 0326 08/29/24 0329 08/31/24 0426  NA 135  --  135  --  134*  K 4.5  --  4.6  --  4.5  CL 99  --  100  --  103  CO2 22  --  21*  --  17*  GLUCOSE 177*  --  195*  --  253*  BUN 41*  --  41*  --  53*  CREATININE 3.79*  --  3.55*  --  3.52*  CALCIUM 9.2  --  8.3*  --  8.1*  AST 16  --   --   --  14*  ALT 17  --   --   --  14  ALKPHOS 98  --   --   --  71  BILITOT 0.5  --   --   --  0.7  ALBUMIN 3.7  --   --   --  2.4*  MG  --   --  1.6*  --  2.5*  LATICACIDVEN 2.4* 1.4  --   --   --   INR 1.0  --   --   --   --   HGBA1C  --   --   --  8.4*  --     ------------------------------------------------------------------------------------------------------------------ No results for input(s): CHOL, HDL, LDLCALC, TRIG, CHOLHDL, LDLDIRECT in the last 72 hours.  Lab Results  Component Value Date   HGBA1C 8.4 (H) 08/29/2024   ------------------------------------------------------------------------------------------------------------------ No results for input(s): TSH, T4TOTAL, T3FREE, THYROIDAB in the last 72 hours.  Invalid input(s): FREET3  Cardiac Enzymes No results for input(s): CKMB, TROPONINI, MYOGLOBIN in the last 168 hours.  Invalid input(s): CK ------------------------------------------------------------------------------------------------------------------    Component Value Date/Time   BNP 295.6 (H)  01/20/2024 1042    CBG: Recent Labs  Lab 08/30/24 1330 08/30/24 1529 08/30/24 2258 08/31/24 0353 08/31/24 0808  GLUCAP 133* 150* 208* 232* 223*    Recent Results (from the past 240 hours)  Culture, blood (Routine x 2)     Status: None (Preliminary result)   Collection Time: 08/28/24  4:15 PM   Specimen: BLOOD LEFT FOREARM  Result Value Ref Range Status   Specimen Description   Final    BLOOD LEFT FOREARM Performed at [facility], [address], [place], [place] [address]    Special Requests   Final    BOTTLES DRAWN AEROBIC AND ANAEROBIC Blood Culture adequate volume Performed at [facility], [address] [place], [place] [address]    Culture   Final    NO GROWTH 3 DAYS Performed at [facility], [address] [place], [place] [address]    Report Status PENDING  Incomplete  Culture, blood (Routine x 2)     Status: None (Preliminary result)   Collection Time: 08/28/24  4:16 PM   Specimen: BLOOD RIGHT FOREARM  Result Value Ref Range Status   Specimen Description   Final    BLOOD RIGHT FOREARM Performed at [facility], [address], [place], [place] [address]    Special Requests   Final    BOTTLES DRAWN AEROBIC AND ANAEROBIC Blood Culture adequate volume Performed at [facility], [address], [place], [place] [address]    Culture   Final    NO GROWTH 3 DAYS Performed at [facility], [address], [place], [place] [address]    Report Status PENDING  Incomplete  Aerobic/Anaerobic Culture w Gram Stain (surgical/deep wound)     Status: None (Preliminary result)   Collection Time: 08/28/24 10:16 PM   Specimen: Path Tissue  Result Value Ref Range Status   Specimen Description TISSUE  Final   Special Requests PERINEUM TISSUE CULTURE SPEC A  Final   Gram Stain   Final    FEW WBC PRESENT, PREDOMINANTLY PMN MODERATE GRAM POSITIVE COCCI RARE GRAM NEGATIVE RODS    Culture   Final    CULTURE  REINCUBATED FOR BETTER GROWTH HOLDING FOR POSSIBLE ANAEROBE Performed at [facility], [address], [place], [place] [address]    Report Status PENDING  Incomplete  MRSA Next Gen by PCR, Nasal     Status: None   Collection Time: 08/28/24 11:42 PM   Specimen: Nasal Mucosa; Nasal Swab  Result Value Ref Range Status   MRSA by PCR Next Gen NOT DETECTED NOT DETECTED Final    Comment: (NOTE) The GeneXpert MRSA Assay (FDA approved for NASAL specimens only), is one component of a comprehensive MRSA colonization surveillance program. It is not intended to diagnose MRSA infection nor to guide or monitor treatment for MRSA infections. Test performance is not FDA approved in patients less than 70 years old. Performed at [facility], [address], [place], [place] [address]   Culture, blood (Routine X 2) w Reflex to ID Panel     Status: None (Preliminary result)   Collection Time: 08/29/24  3:29 AM   Specimen: BLOOD  Result Value Ref Range Status   Specimen Description BLOOD SITE NOT SPECIFIED  Final   Special Requests   Final    BOTTLES DRAWN AEROBIC AND ANAEROBIC Blood Culture results may not be optimal due to an inadequate volume of blood received in culture bottles   Culture   Final    NO GROWTH 2 DAYS Performed at [facility], [address], [place], [place] [address]    Report Status PENDING  Incomplete  Culture, blood (Routine X 2) w Reflex to ID Panel     Status: None (Preliminary result)   Collection Time: 08/29/24  3:31 AM   Specimen: BLOOD  Result Value Ref Range Status   Specimen Description BLOOD SITE NOT SPECIFIED  Final   Special Requests   Final    BOTTLES DRAWN AEROBIC AND ANAEROBIC Blood Culture results may not be optimal due to an inadequate volume of blood received in culture bottles   Culture   Final    NO GROWTH 2 DAYS Performed at [facility], [address], [place], [place] [address]    Report Status PENDING  Incomplete      Radiology Studies: No results found.   Nilda Fendt, MD, PhD [facility]  Between 7 am - 7 pm I am available, please contact me via Amion (for emergencies) or Securechat (non urgent messages)  Between 7 pm - 7 am I am not available, please contact night coverage MD/APP via Amion

## 2024-08-31 NOTE — Care Management Important Message (Signed)
 Important Message  Patient Details  Name: Brendan [name]. MRN: [id number] Date of Birth: [birth year]   Important Message Given:  Yes - Medicare IM     Claretta Deed 08/31/2024, 3:40 PM

## 2024-08-31 NOTE — Inpatient Diabetes Management (Signed)
 Inpatient Diabetes Program Recommendations  AACE/ADA: New Consensus Statement on Inpatient Glycemic Control (2015)  Target Ranges:  Prepandial:   less than 140 mg/dL      Peak postprandial:   less than 180 mg/dL (1-2 hours)      Critically ill patients:  140 - 180 mg/dL   Lab Results  Component Value Date   GLUCAP 223 (H) 08/31/2024   HGBA1C 8.4 (H) 08/29/2024    Review of Glycemic Control  Latest Reference Range & Units 08/30/24 07:57 08/30/24 09:47 08/30/24 11:10 08/30/24 13:30 08/30/24 15:29 08/30/24 22:58 08/31/24 03:53 08/31/24 08:08  Glucose-Capillary 70 - 99 mg/dL 77 84 873 (H) 866 (H) 849 (H) 208 (H) 232 (H) 223 (H)  (H): Data is abnormally high  Diabetes history: DM2 Outpatient Diabetes medications: Lantus  25 units every day, Humalog  0-18 units TID, Farxiga  10 mg every day, Mounjaro  12.5 weekly (not taking) Current orders for Inpatient glycemic control: Novolog  0-20 units Q4H, Lantus  8 units QD  Inpatient Diabetes Program Recommendations:    Patient has diet order.  If eating, please consider:  Novolog  0-20 units TID and HS  Thank you, Wyvonna Pinal, MSN, CDCES Diabetes Coordinator Inpatient Diabetes Program [phone] (team pager from 8a-5p)

## 2024-09-01 ENCOUNTER — Inpatient Hospital Stay (HOSPITAL_COMMUNITY)

## 2024-09-01 DIAGNOSIS — N493 Fournier gangrene: Secondary | ICD-10-CM | POA: Diagnosis not present

## 2024-09-01 DIAGNOSIS — E1165 Type 2 diabetes mellitus with hyperglycemia: Secondary | ICD-10-CM | POA: Diagnosis not present

## 2024-09-01 DIAGNOSIS — M726 Necrotizing fasciitis: Secondary | ICD-10-CM | POA: Diagnosis not present

## 2024-09-01 LAB — GLUCOSE, CAPILLARY
Glucose-Capillary: 131 mg/dL — ABNORMAL HIGH (ref 70–99)
Glucose-Capillary: 68 mg/dL — ABNORMAL LOW (ref 70–99)
Glucose-Capillary: 77 mg/dL (ref 70–99)
Glucose-Capillary: 97 mg/dL (ref 70–99)
Glucose-Capillary: 157 mg/dL — ABNORMAL HIGH (ref 70–99)
Glucose-Capillary: 216 mg/dL — ABNORMAL HIGH (ref 70–99)
Glucose-Capillary: 236 mg/dL — ABNORMAL HIGH (ref 70–99)

## 2024-09-01 LAB — CBC
HCT: 32.1 % — ABNORMAL LOW (ref 39.0–52.0)
Hemoglobin: 10.7 g/dL — ABNORMAL LOW (ref 13.0–17.0)
MCH: 27.6 pg (ref 26.0–34.0)
MCHC: 33.3 g/dL (ref 30.0–36.0)
MCV: 82.9 fL (ref 80.0–100.0)
Platelets: 400 K/uL (ref 150–400)
RBC: 3.87 MIL/uL — ABNORMAL LOW (ref 4.22–5.81)
RDW: 15 % (ref 11.5–15.5)
WBC: 13.5 K/uL — ABNORMAL HIGH (ref 4.0–10.5)
nRBC: 0 % (ref 0.0–0.2)

## 2024-09-01 LAB — BASIC METABOLIC PANEL WITH GFR
Anion gap: 12 (ref 5–15)
BUN: 55 mg/dL — ABNORMAL HIGH (ref 8–23)
CO2: 21 mmol/L — ABNORMAL LOW (ref 22–32)
Calcium: 8.1 mg/dL — ABNORMAL LOW (ref 8.9–10.3)
Chloride: 106 mmol/L (ref 98–111)
Creatinine, Ser: 3.81 mg/dL — ABNORMAL HIGH (ref 0.61–1.24)
GFR, Estimated: 16 mL/min — ABNORMAL LOW (ref >60)
Glucose, Bld: 97 mg/dL (ref 70–99)
Potassium: 3.8 mmol/L (ref 3.5–5.1)
Sodium: 139 mmol/L (ref 135–145)

## 2024-09-01 MED ORDER — PROCHLORPERAZINE EDISYLATE 10 MG/2ML IJ SOLN
10.0000 mg | Freq: Four times a day (QID) | INTRAMUSCULAR | Status: DC | PRN
  Administered 2024-09-01 – 2024-09-03 (×2): 10 mg via INTRAVENOUS
  Filled 2024-09-01 (×2): qty 2

## 2024-09-01 MED ORDER — INSULIN GLARGINE 100 UNIT/ML ~~LOC~~ SOLN
4.0000 [IU] | Freq: Every day | SUBCUTANEOUS | Status: DC
  Administered 2024-09-02: 4 [IU] via SUBCUTANEOUS
  Filled 2024-09-01 (×2): qty 0.04

## 2024-09-01 MED ORDER — BISACODYL 10 MG RE SUPP: 10.0000 mg | Freq: Every day | RECTAL | Status: DC | PRN

## 2024-09-01 MED ORDER — INSULIN ASPART 100 UNIT/ML IJ SOLN
0.0000 [IU] | Freq: Three times a day (TID) | INTRAMUSCULAR | Status: DC
  Administered 2024-09-01 – 2024-09-02 (×3): 5 [IU] via SUBCUTANEOUS
  Administered 2024-09-02: 11 [IU] via SUBCUTANEOUS
  Administered 2024-09-03 (×2): 3 [IU] via SUBCUTANEOUS

## 2024-09-01 MED ORDER — INSULIN ASPART 100 UNIT/ML IJ SOLN
0.0000 [IU] | Freq: Every day | INTRAMUSCULAR | Status: DC
  Administered 2024-09-01 – 2024-09-02 (×2): 2 [IU] via SUBCUTANEOUS

## 2024-09-01 MED ORDER — SODIUM CHLORIDE 0.9 % IV SOLN
3.0000 g | Freq: Two times a day (BID) | INTRAVENOUS | Status: DC
  Administered 2024-09-02 – 2024-09-03 (×3): 3 g via INTRAVENOUS
  Filled 2024-09-01 (×3): qty 8

## 2024-09-01 MED ORDER — HYDRALAZINE HCL 50 MG PO TABS
50.0000 mg | ORAL_TABLET | Freq: Two times a day (BID) | ORAL | Status: DC
  Administered 2024-09-01 – 2024-09-03 (×4): 50 mg via ORAL
  Filled 2024-09-01 (×4): qty 1

## 2024-09-01 NOTE — Progress Notes (Signed)
 Transition of Care [facility]) - Inpatient Brief Assessment   Patient Details  Name: Brendan [name]. MRN: [id number] Date of Birth: 1956/03/31  Transition of Care [facility] CM/SW Contact:    Brendan JONELLE Joe, RN Phone Number: 09/01/2024, 11:36 AM   Clinical Narrative: CM met with the patient/wife at the bedside  - regarding patient's scrotal/perineal wound - S/P surgery on 08/28/24.  The patient's wife is aware that bedside nursing will meet with her today to have her receive teaching for wound care at the bedside to see if the wife is able to provide wound care for home since wound will require large/deep wet-to-dry dressing changes.  I updated Brendan [name], International aid/development worker that wife was present at the bedside.  DME at the home includes CPAP with RA, Cane, Glucometer, Libre 2 glucose monitoring sensor, BP machine.  Patient wife states that she will meet with RN and if patient is able to return home with home health services - pending teaching today - that she does not have preference for [facility] agency.  CM will continue to follow the patient for discharge planning needs.  ID MD has also been consulted by attending MD.   Transition of Care Asessment: Insurance and Status: (P) Insurance coverage has been reviewed Patient has primary care physician: (P) Yes Home environment has been reviewed: (P) from home with spouse Prior level of function:: (P) self Prior/Current Home Services: (P) No current home services (DME at the home includes CPAP on RA, Libre 3 glucose sensor, Glucometer, BP machine, Cane) Social Drivers of Health Review: (P) SDOH reviewed needs interventions Readmission risk has been reviewed: (P) Yes Transition of care needs: (P) transition of care needs identified, TOC will continue to follow

## 2024-09-01 NOTE — Plan of Care (Signed)
  Problem: Education: Goal: Knowledge of General Education information will improve Description: Including pain rating scale, medication(s)/side effects and non-pharmacologic comfort measures Outcome: Progressing   Problem: Health Behavior/Discharge Planning: Goal: Ability to manage health-related needs will improve Outcome: Progressing   Problem: Clinical Measurements: Goal: Ability to maintain clinical measurements within normal limits will improve Outcome: Progressing Goal: Will remain free from infection Outcome: Progressing Goal: Diagnostic test results will improve Outcome: Progressing Goal: Cardiovascular complication will be avoided Outcome: Progressing   Problem: Activity: Goal: Risk for activity intolerance will decrease Outcome: Progressing   Problem: Nutrition: Goal: Adequate nutrition will be maintained Outcome: Progressing   Problem: Coping: Goal: Level of anxiety will decrease Outcome: Progressing   Problem: Elimination: Goal: Will not experience complications related to bowel motility Outcome: Progressing Goal: Will not experience complications related to urinary retention Outcome: Progressing   Problem: Pain Managment: Goal: General experience of comfort will improve and/or be controlled Outcome: Progressing   Problem: Safety: Goal: Ability to remain free from injury will improve Outcome: Progressing   Problem: Skin Integrity: Goal: Risk for impaired skin integrity will decrease Outcome: Progressing   Problem: Education: Goal: Ability to describe self-care measures that may prevent or decrease complications (Diabetes Survival Skills Education) will improve Outcome: Progressing   Problem: Coping: Goal: Ability to adjust to condition or change in health will improve Outcome: Progressing   Problem: Fluid Volume: Goal: Ability to maintain a balanced intake and output will improve Outcome: Progressing   Problem: Health Behavior/Discharge  Planning: Goal: Ability to identify and utilize available resources and services will improve Outcome: Progressing Goal: Ability to manage health-related needs will improve Outcome: Progressing   Problem: Metabolic: Goal: Ability to maintain appropriate glucose levels will improve Outcome: Progressing   Problem: Nutritional: Goal: Maintenance of adequate nutrition will improve Outcome: Progressing Goal: Progress toward achieving an optimal weight will improve Outcome: Progressing   Problem: Skin Integrity: Goal: Risk for impaired skin integrity will decrease Outcome: Progressing   Problem: Tissue Perfusion: Goal: Adequacy of tissue perfusion will improve Outcome: Progressing

## 2024-09-01 NOTE — Plan of Care (Addendum)
 Patient calm and cooperative A&O X4, family at bedside visiting during visitation hours. Patient had one medium bowel movement using bedpan. Dressing to private area changed. One episode of hypoglycemia, juice given as requested by patient, glucose raise to 70s. Patient left with call bell in reach and bed in lowest position. 0630 Large bowel movement on bedpan, wound dressing changed, male purewick changed, partial linens change given.  Problem: Education: Goal: Knowledge of General Education information will improve Description: Including pain rating scale, medication(s)/side effects and non-pharmacologic comfort measures Outcome: Progressing   Problem: Health Behavior/Discharge Planning: Goal: Ability to manage health-related needs will improve Outcome: Progressing   Problem: Activity: Goal: Risk for activity intolerance will decrease Outcome: Progressing   Problem: Nutrition: Goal: Adequate nutrition will be maintained Outcome: Progressing   Problem: Coping: Goal: Level of anxiety will decrease Outcome: Progressing   Problem: Pain Managment: Goal: General experience of comfort will improve and/or be controlled Outcome: Progressing   Problem: Safety: Goal: Ability to remain free from injury will improve Outcome: Progressing   Problem: Education: Goal: Ability to describe self-care measures that may prevent or decrease complications (Diabetes Survival Skills Education) will improve Outcome: Progressing   Problem: Coping: Goal: Ability to adjust to condition or change in health will improve Outcome: Progressing   Problem: Fluid Volume: Goal: Ability to maintain a balanced intake and output will improve Outcome: Progressing

## 2024-09-01 NOTE — Progress Notes (Signed)
 Progress Note  2 Days Post-Op  Subjective: Patient reports that pain is manageable. Reports 2 BMs yesterday - one hard stool requiring manual disimpaction followed by a larger loose stool. His cc this morning is nausea and belching, which he states started just two minutes before I walked in. He says his last episode of nausea/vomiting before this was day before yesterday. He had an episode of hypoglycemia yesterday but BG this AM looks good - 97.   He tells me that he lives at home with his wife who is able to help him take care of his perineal wound.    ROS  Per HPI  Objective: Vital signs in last 24 hours: Temp:  [97.5 F (36.4 C)-98.1 F (36.7 C)] 97.8 F (36.6 C) (10/14 0808) Pulse Rate:  [55-66] 66 (10/14 0808) Resp:  [18-19] 18 (10/13 1541) BP: (137-173)/(69-91) 145/74 (10/14 0808) SpO2:  [98 %-100 %] 100 % (10/14 0808) Last BM Date : 08/29/24  Intake/Output from previous day: 10/13 0701 - 10/14 0700 In: 480 [P.O.:480] Out: 510 [Urine:510] Intake/Output this shift: No intake/output data recorded.  PE: Physical exam completed with nursing staff present. General: Pleasant male who is sitting up in bed, holding emesis bag HEENT: Head is normocephalic, atraumatic.  Heart: HR normal. Lungs: Respiratory effort nonlabored. Abd: protuberant, upper abdominal tympany, abdomen is non-tender, no guarding.  Skin: dressing changed deferred, just changed at 0630 after BM. Pics from yesterday noted. Psych: A&Ox3 with an appropriate affect.    Lab Results:  Recent Labs    08/31/24 0426 09/01/24 0342  WBC 22.8* 13.5*  HGB 10.7* 10.7*  HCT 31.9* 32.1*  PLT 308 400   BMET Recent Labs    08/31/24 0426 09/01/24 0342  NA 134* 139  K 4.5 3.8  CL 103 106  CO2 17* 21*  GLUCOSE 253* 97  BUN 53* 55*  CREATININE 3.52* 3.81*  CALCIUM 8.1* 8.1*   PT/INR No results for input(s): LABPROT, INR in the last 72 hours.  CMP     Component Value Date/Time   NA 139  09/01/2024 0342   NA 140 09/03/2022 0849   K 3.8 09/01/2024 0342   CL 106 09/01/2024 0342   CO2 21 (L) 09/01/2024 0342   GLUCOSE 97 09/01/2024 0342   BUN 55 (H) 09/01/2024 0342   BUN 26 09/03/2022 0849   CREATININE 3.81 (H) 09/01/2024 0342   CALCIUM 8.1 (L) 09/01/2024 0342   PROT 5.9 (L) 08/31/2024 0426   ALBUMIN 2.4 (L) 08/31/2024 0426   AST 14 (L) 08/31/2024 0426   ALT 14 08/31/2024 0426   ALKPHOS 71 08/31/2024 0426   BILITOT 0.7 08/31/2024 0426   GFRNONAA 16 (L) 09/01/2024 0342   GFRAA 29 (L) 08/25/2020 1144   Lipase  No results found for: LIPASE     Studies/Results: No results found.  Anti-infectives: Anti-infectives (From admission, onward)    Start     Dose/Rate Route Frequency Ordered Stop   08/31/24 1030  metroNIDAZOLE (FLAGYL) tablet 500 mg        500 mg Oral Every 12 hours 08/31/24 0933     08/31/24 1030  linezolid (ZYVOX) tablet 600 mg        600 mg Oral Every 12 hours 08/31/24 0933     08/30/24 1000  linezolid (ZYVOX) IVPB 600 mg  Status:  Discontinued        600 mg 300 mL/hr over 60 Minutes Intravenous Every 12 hours 08/29/24 1349 08/31/24 0933   08/29/24 0600  vancomycin (VANCOREADY) IVPB 1500 mg/300 mL  Status:  Discontinued        1,500 mg 150 mL/hr over 120 Minutes Intravenous Every 48 hours 08/29/24 0100 08/29/24 1404   08/29/24 0400  ceFEPIme (MAXIPIME) 2 g in sodium chloride  0.9 % 100 mL IVPB        2 g 200 mL/hr over 30 Minutes Intravenous Every 24 hours 08/29/24 0100     08/29/24 0030  metroNIDAZOLE (FLAGYL) IVPB 500 mg  Status:  Discontinued        500 mg 100 mL/hr over 60 Minutes Intravenous Every 12 hours 08/29/24 0025 08/31/24 0933   08/28/24 1815  piperacillin-tazobactam (ZOSYN) IVPB 3.375 g        3.375 g 100 mL/hr over 30 Minutes Intravenous  Once 08/28/24 1805 08/28/24 1908   08/28/24 1815  linezolid (ZYVOX) IVPB 600 mg        600 mg 300 mL/hr over 60 Minutes Intravenous  Once 08/28/24 1805 08/28/24 1923   08/28/24 1730   cefTRIAXone (ROCEPHIN) 2 g in sodium chloride  0.9 % 100 mL IVPB        2 g 200 mL/hr over 30 Minutes Intravenous  Once 08/28/24 1724 08/28/24 1821   08/28/24 1715  cefTRIAXone (ROCEPHIN) injection 2 g  Status:  Discontinued        2 g Intramuscular  Once 08/28/24 1705 08/28/24 1724        Assessment/Plan POD 1: S/P debridement of perineal wound 08/30/2024 by Dr. Cordella [name] -Afebrile -WBC 13 from 22 - No concerns of developing/remaining necrotic tissue. -Continue with dressing changes BID or when soiled. -Continue antibiotics per primary team; Cx still pending - discussed with bedside RN plans to teach wife wound care education when she arrives. If patient/wife unable to care for wound then he may need SNF. -Will continue to follow. -given recent constipation, nausea, vomiting, belching, I ordered KUB to look at bowel gas pattern  FEN: CM; IVF per primary team VTE: SCDs, Heparin  injeection ID: Cefepime, linezolid, metronidazole    LOS: 4 days   I reviewed specialist notes, hospitalist notes, last 24 h vitals and pain scores, last 48 h intake and output, last 24 h labs and trends, and last 24 h imaging results.   Brendan [name], [facility] 09/01/2024, 8:47 AM Please see Amion for pager number during day hours 7:00am-4:30pm

## 2024-09-01 NOTE — Consult Note (Signed)
 [facility]    Date of Admission:  08/28/2024     Total days of antibiotics 5               Reason for Consult: Necrotizing fasciitis   Referring Provider: Dr. Trixie  Primary Care Provider: Rexanne Ingle, MD   ASSESSMENT:  Brendan [name] is a African-American male with history of diabetes and chronic kidney disease presenting with acute onset scrotal pain and swelling and found to have necrotizing fasciitis now status post debridement.  Surgical specimens are polymicrobial with Streptococcus constellatus, Staphylococcus aureus, and Prevotella bivia.  Sensitivities remain pending.   Brendan [name] is currently postop day #2 from repeat debridement with no further surgical interventions planned and tolerating antibiotics with improved leukocytosis.  Discussed plan of care to continue current dose of linezolid while awaiting Staphylococcus aureus sensitivities and change cefepime and metronidazole to ampicillin-sulbactam.  Once sensitivities are available will provide route and medication.  Continue postoperative wound care per general surgery.  Discussed importance of blood sugar control to reduce risk of complicated healing and further infection.  Therapeutic drug monitoring of platelet levels while on linezolid.  Standard/universal precautions.  Remaining medical and supportive care per internal medicine.  PLAN:  Continue current dose of linezolid. Discontinue cefepime and metronidazole and start ampicillin-sulbactam. Monitor surgical specimens for sensitivities with narrowing of antibiotics as able. Postoperative wound care per general surgery. Blood sugar control to reduce risk of complicated healing and further infection. Standard/universal precautions. Remaining medical and supportive care per internal medicine.   Principal Problem:   Necrotizing fasciitis (HCC) Active Problems:   Sepsis (HCC)    carvedilol   25 mg Oral BID WC   Chlorhexidine   Gluconate Cloth  6 each Topical Daily   dorzolamide  1 drop Both Eyes Daily   heparin  injection (subcutaneous)  5,000 Units Subcutaneous Q8H   insulin  aspart  0-15 Units Subcutaneous TID WC   insulin  aspart  0-5 Units Subcutaneous QHS   insulin  glargine  4 Units Subcutaneous Daily   levothyroxine   125 mcg Oral Q0600   linezolid  600 mg Oral Q12H   metroNIDAZOLE  500 mg Oral Q12H   timolol   1 drop Both Eyes Daily     HPI: Brendan [name]. is a 68 y.o. male with previous medical history of congestive heart failure, chronic kidney disease stage IV, diabetes, hypertension, hypothyroidism, and obesity presenting to the hospital with groin pain and swelling.  Brendan [name] was in his normal state of health until approximately 1 week prior to presentation when he noticed a boil located on his scrotum behind his testicles starting on 08/27/2024 that progressively worsened and developed swelling and pain.  Initially afebrile with leukocytosis of 26,300.  Started on broad-spectrum antibiotics with vancomycin, piperacillin-tazobactam, and had received doses of ceftriaxone, metronidazole, and linezolid.  CT pelvis with edema/swelling throughout the scrotal wall with gas noted in the scrotum and lower perineum concerning for necrotizing fasciitis.  Brought to the OR on 08/28/2024 for sharp excisional debridement of the perineum and posterior scrotum with findings of necrotizing infection.  Surgical specimens from 08/28/2024 with Streptococcus constellatus, Staphylococcus aureus, and Prevotella bivia.  Return to the OR on 08/30/2024 for debridement of the perineal wound.  Blood cultures have remained negative.  Brendan [name] is currently on day 5 of antimicrobial therapy with linezolid, cefepime, and metronidazole.  Tolerating antibiotics with no adverse side effects.  White blood cell count down to 13,500.  No further surgical intervention  planned at this time with continued wound care.  ID has been asked for antibiotic  recommendations.   Review of Systems: Review of Systems  Constitutional:  Negative for chills, fever and weight loss.  Respiratory:  Negative for cough, shortness of breath and wheezing.   Cardiovascular:  Negative for chest pain and leg swelling.  Gastrointestinal:  Negative for abdominal pain, constipation, diarrhea, nausea and vomiting.  Skin:  Negative for rash.     Past Medical History:  Diagnosis Date   Acanthosis nigricans    Atopic dermatitis    CHF (congestive heart failure) (HCC)    CKD (chronic kidney disease) stage 4, GFR 15-29 ml/min (HCC) 01/08/2023   Diabetes (HCC) 11/19/2002   Erectile dysfunction    Fatty liver 11/19/2005   Glaucoma 11/19/2006   Gynecomastia 11/20/2007   Hx of adenomatous colonic polyps 08/26/2023   Hyperaldosteronism 11/19/1998   Hypercholesterolemia    Hyperlipidemia 2010   Hypertension 1999   Hypoglycemic reaction    Hypothyroidism 11/19/2002   Incontinence    Intermittent vertigo 11/19/2010   Left cervical radiculopathy 11/19/2010   Lumbar radiculopathy    Obesity    Peripheral neuropathy    Pollen allergies 11/19/2005   perennial   Prostate cancer (HCC) 11/19/2004   Reflux esophagitis 11/19/1993   Sickle cell trait 11/19/2004   Sleep apnea, obstructive 11/19/1998   uses a cpap   Venous insufficiency 11/20/2003   Vitamin D deficiency 11/19/2010    Social History   Tobacco Use   Smoking status: Never   Smokeless tobacco: Never  Vaping Use   Vaping status: Never Used  Substance Use Topics   Alcohol use: Yes    Comment: one drink every few months - 1965   Drug use: No    Family History  Problem Relation Age of Onset   Heart failure Mother    Hypertension Father        deceased age 71   Heart attack Father    COPD Sister    CVA Sister    Diabetes Daughter    Colon cancer Neg Hx    Stomach cancer Neg Hx    Colon polyps Neg Hx    Esophageal cancer Neg Hx    Rectal cancer Neg Hx     Allergies  Allergen  Reactions   Ace Inhibitors Cough   Maxidex [Dexamethasone] Other (See Comments)    Sexual dysfunction   Verapamil Other (See Comments)    constipation    OBJECTIVE: Blood pressure (!) 145/74, pulse 66, temperature 97.8 F (36.6 C), resp. rate 18, height 6' 2.02 (1.88 m), weight 108.1 kg, SpO2 100%.  Physical Exam Constitutional:      General: He is not in acute distress.    Appearance: He is well-developed.  Cardiovascular:     Rate and Rhythm: Normal rate and regular rhythm.     Heart sounds: Normal heart sounds.  Pulmonary:     Effort: Pulmonary effort is normal.     Breath sounds: Normal breath sounds.  Genitourinary:    Comments: Surgical dressings in place.  Skin:    General: Skin is warm and dry.  Neurological:     Mental Status: He is alert and oriented to person, place, and time.     Lab Results Lab Results  Component Value Date   WBC 13.5 (H) 09/01/2024   HGB 10.7 (L) 09/01/2024   HCT 32.1 (L) 09/01/2024   MCV 82.9 09/01/2024   PLT 400 09/01/2024    Lab  Results  Component Value Date   CREATININE 3.81 (H) 09/01/2024   BUN 55 (H) 09/01/2024   NA 139 09/01/2024   K 3.8 09/01/2024   CL 106 09/01/2024   CO2 21 (L) 09/01/2024    Lab Results  Component Value Date   ALT 14 08/31/2024   AST 14 (L) 08/31/2024   ALKPHOS 71 08/31/2024   BILITOT 0.7 08/31/2024     Microbiology: Recent Results (from the past 240 hours)  Culture, blood (Routine x 2)     Status: None (Preliminary result)   Collection Time: 08/28/24  4:15 PM   Specimen: BLOOD LEFT FOREARM  Result Value Ref Range Status   Specimen Description   Final    BLOOD LEFT FOREARM Performed at [facility], [address], [place], [place] [address]    Special Requests   Final    BOTTLES DRAWN AEROBIC AND ANAEROBIC Blood Culture adequate volume Performed at [facility], [address], [place], [place] [address]    Culture   Final    NO GROWTH 4 DAYS Performed at  [facility], [address], [place], [place] [address]    Report Status PENDING  Incomplete  Culture, blood (Routine x 2)     Status: None (Preliminary result)   Collection Time: 08/28/24  4:16 PM   Specimen: BLOOD RIGHT FOREARM  Result Value Ref Range Status   Specimen Description   Final    BLOOD RIGHT FOREARM Performed at [facility], [address], [place], [place] [address]    Special Requests   Final    BOTTLES DRAWN AEROBIC AND ANAEROBIC Blood Culture adequate volume Performed at [facility], [address] [place], [place] [address]    Culture   Final    NO GROWTH 4 DAYS Performed at [facility], [address], [place], [place] [address]    Report Status PENDING  Incomplete  Aerobic/Anaerobic Culture w Gram Stain (surgical/deep wound)     Status: None (Preliminary result)   Collection Time: 08/28/24 10:16 PM   Specimen: Path Tissue  Result Value Ref Range Status   Specimen Description TISSUE  Final   Special Requests PERINEUM TISSUE CULTURE SPEC A  Final   Gram Stain   Final    FEW WBC PRESENT, PREDOMINANTLY PMN MODERATE GRAM POSITIVE COCCI RARE GRAM NEGATIVE RODS    Culture   Final    ABUNDANT STREPTOCOCCUS CONSTELLATUS RARE STAPHYLOCOCCUS AUREUS SUSCEPTIBILITIES TO FOLLOW FEW PREVOTELLA BIVIA BETA LACTAMASE POSITIVE Performed at [facility], [address], [place], [place] [address]    Report Status PENDING  Incomplete  MRSA Next Gen by PCR, Nasal     Status: None   Collection Time: 08/28/24 11:42 PM   Specimen: Nasal Mucosa; Nasal Swab  Result Value Ref Range Status   MRSA by PCR Next Gen NOT DETECTED NOT DETECTED Final    Comment: (NOTE) The GeneXpert MRSA Assay (FDA approved for NASAL specimens only), is one component of a comprehensive MRSA colonization surveillance program. It is not intended to diagnose MRSA infection nor to guide or monitor treatment for MRSA infections. Test performance is not  FDA approved in patients less than 12 years old. Performed at [facility], [address] [place], [place] [address]   Culture, blood (Routine X 2) w Reflex to ID Panel     Status: None (Preliminary result)   Collection Time: 08/29/24  3:29 AM  Specimen: BLOOD  Result Value Ref Range Status   Specimen Description BLOOD SITE NOT SPECIFIED  Final   Special Requests   Final    BOTTLES DRAWN AEROBIC AND ANAEROBIC Blood Culture results may not be optimal due to an inadequate volume of blood received in culture bottles   Culture   Final    NO GROWTH 3 DAYS Performed at [facility], [address], [place], [place] [address]    Report Status PENDING  Incomplete  Culture, blood (Routine X 2) w Reflex to ID Panel     Status: None (Preliminary result)   Collection Time: 08/29/24  3:31 AM   Specimen: BLOOD  Result Value Ref Range Status   Specimen Description BLOOD SITE NOT SPECIFIED  Final   Special Requests   Final    BOTTLES DRAWN AEROBIC AND ANAEROBIC Blood Culture results may not be optimal due to an inadequate volume of blood received in culture bottles   Culture   Final    NO GROWTH 3 DAYS Performed at [facility], [address] [place], [place] [address]    Report Status PENDING  Incomplete    I have personally spent 35 minutes involved in face-to-face and non-face-to-face activities for this patient on the day of the visit. Professional time spent includes the following activities: preparing to see the patient (review of tests), obtaining and reviewing separately obtained history (admission/discharge record), performing a medically appropriate examination, ordering medications, communicating with other health care professionals, documenting clinical information in the EMR, communicating results and counseling patient regarding medication and plan of care, and care coordination.    Greg Maren Wiesen, NP [facility]  09/01/2024  11:27 AM

## 2024-09-01 NOTE — Progress Notes (Signed)
 PROGRESS NOTE  Brendan [name]. FMW:[id number] DOB: February 03, 1956 DOA: 08/28/2024 PCP: Rexanne Ingle, MD   LOS: 4 days   Brief Narrative / Interim history: 68 year old male with history of DM2, HTN, hypothyroidism, OSA, obesity, CKD 4, chronic diastolic CHF who came into the hospital with groin pain and swelling.  He had a boil behind his testicles which started draining on 10/9, and this was associated with scrotal and penile swelling.  He was found to have necrotizing fasciitis, general surgery as well as urology was consulted and emergently was taken to the OR upon presentation on 10/10 and was monitored in the ICU afterwards.  Upon stability he was transferred to the hospitalist service on 10/12.  Subjective / 24h Interval events: He denies any significant groin pain area.  Denies any fever or chills.  Assesement and Plan: Principal problem Sepsis due to necrotizing fasciitis of the scrotum/perineal area -appreciate general surgery and urology input.  He was initially taken to the OR on 10/10 for I&D, and had repeat OR visit for I&D 10/12.  As of now, no additional surgeries are planned - Has been placed on broad-spectrum antibiotics with cefepime, linezolid as well as metronidazole, continue.  Preliminary cultures showing strep, Staph aureus, Prevotella.  ID consulted today, appreciate input regarding antibiotic choice and duration - Discharge planning ongoing, once cleared by surgery and ID he could go home, discussed with the patient and his wife 10/13, she may be able to do dressing changes as she has done in the past.  I asked her to come to the hospital and get teaching.  If she is unable to do dressing changes, he may need to go to SNF  Active problems DM 2, poorly controlled, with hyper and hypoglycemia -CBGs are somewhat brittle, he is hypoglycemic in the morning and BGs tend to rise towards the end of the day.  Adjust insulin  regimen again today  Hypothyroidism-continue  Synthroid   Essential hypertension-continue Coreg , blood pressure is stable and acceptable  AKI on CKD stage IV -baseline creatinine earlier this year 2.8-3.0.  Creatinine during this admission was 3.79 in the setting of active infection, somewhat stable in the mid to upper threes, 3.81 today, I do suspect this may be a new baseline given this hospitalization/severe infection/ICU stay  Chronic diastolic CHF -euvolemic  OSA -continue home nightly CPAP  Obesity, class I-BMI borderline at 30.6  Scheduled Meds:  carvedilol   25 mg Oral BID WC   Chlorhexidine  Gluconate Cloth  6 each Topical Daily   dorzolamide  1 drop Both Eyes Daily   heparin  injection (subcutaneous)  5,000 Units Subcutaneous Q8H   insulin  aspart  0-20 Units Subcutaneous Q4H   insulin  glargine  8 Units Subcutaneous Daily   levothyroxine   125 mcg Oral Q0600   linezolid  600 mg Oral Q12H   metroNIDAZOLE  500 mg Oral Q12H   timolol   1 drop Both Eyes Daily   Continuous Infusions:  ceFEPime (MAXIPIME) IV 2 g (09/01/24 0513)   PRN Meds:.bisacodyl, morphine injection, ondansetron  (ZOFRAN ) IV, mouth rinse  Current Outpatient Medications  Medication Instructions   acetaminophen  (TYLENOL ) 500 mg, Oral, Every 6 hours PRN   carvedilol  (COREG ) 25 MG tablet Take 1 tablet (25 mg total) by mouth 2 (two) times daily with food   clobetasol  ointment (TEMOVATE ) 0.05 % Apply topically daily before sleeping.   Continuous Glucose Sensor (FREESTYLE LIBRE 3 PLUS SENSOR) MISC Apply to upper back of arm. Change every 15 days.   Continuous Glucose Sensor (FREESTYLE [place]  3 SENSOR) MISC Apply to upper back of arm and change ever 14 days.   dorzolamide (TRUSOPT) 2 % ophthalmic solution 1 drop, Both Eyes, Daily   Farxiga  10 mg, Oral, Daily   hydrALAZINE  (APRESOLINE ) 100 MG tablet Take 1/2 tablet (50 mg total) by mouth 2 (two) times daily.   insulin  lispro (HUMALOG  KWIKPEN) 200 UNIT/ML KwikPen Inject under the skin 12 units at small meals and 24  units at regular meals  3 times a day   Insulin  Pen Needle (TECHLITE PEN NEEDLES) 32G X 4 MM MISC Use to inject insulin  4 times daily   Insulin  Pen Needle 32G X 4 MM MISC Use to inject insulin  up to 4 times daily.   Kerendia  20 mg, Oral, Daily   Lantus  SoloStar 30 Units, Subcutaneous, Daily, Titrate when directed. Max daily dose of 50 units.   levothyroxine  (SYNTHROID ) 125 MCG tablet Take 1 tablet (125 mcg total) by mouth every morning on an empty stomach   Mounjaro  12.5 mg, Subcutaneous, Weekly   Mounjaro  15 mg, Subcutaneous, Weekly   potassium chloride  SA (KLOR-CON  M) 20 MEQ tablet 60 mEq, Oral, Daily   sacubitril -valsartan  (ENTRESTO ) 49-51 MG 1 tablet, Oral, 2 times daily   simvastatin  (ZOCOR ) 20 mg, Oral, Every evening   timolol  (BETIMOL ) 0.5 % ophthalmic solution 1 drop, Both Eyes, Daily   torsemide  (DEMADEX ) 60 mg, Oral, Daily    Diet Orders (From admission, onward)     Start     Ordered   08/30/24 1312  Diet Carb Modified Fluid consistency: Thin; Room service appropriate? Yes  Diet effective now       Question Answer Comment  Diet-HS Snack? Nothing   Calorie Level Medium 1600-2000   Fluid consistency: Thin   Room service appropriate? Yes      08/30/24 1312            DVT prophylaxis: heparin  injection 5,000 Units Start: 08/29/24 0600 SCDs Start: 08/29/24 0023   Lab Results  Component Value Date   PLT 400 09/01/2024      Code Status: Full Code  Family Communication: No family present at bedside  Status is: Inpatient Remains inpatient appropriate because: Severity of illness   Level of care: Telemetry Medical  Consultants:  PCCM General Surgery Urology  Objective: Vitals:   08/31/24 2030 09/01/24 0156 09/01/24 0511 09/01/24 0808  BP: (!) 156/77 (!) 173/91 (!) 143/69 (!) 145/74  Pulse: 62 60 (!) 57 66  Resp:      Temp: 98.1 F (36.7 C) (!) 97.5 F (36.4 C) 98 F (36.7 C) 97.8 F (36.6 C)  TempSrc:   Oral   SpO2: 100% 100% 100% 100%  Weight:       Height:        Intake/Output Summary (Last 24 hours) at 09/01/2024 0954 Last data filed at 08/31/2024 1200 Gross per 24 hour  Intake 480 ml  Output 510 ml  Net -30 ml   Wt Readings from Last 3 Encounters:  08/30/24 108.1 kg  08/10/24 127.9 kg  03/10/24 133.9 kg    Examination: Constitutional: NAD Eyes: lids and conjunctivae normal, no scleral icterus ENMT: mmm Neck: normal, supple Respiratory: clear to auscultation bilaterally, no wheezing, no crackles. Cardiovascular: Regular rate and rhythm, no murmurs / rubs / gallops. No LE edema. Abdomen: soft, no distention, no tenderness. Bowel sounds positive.    Data Reviewed: I have independently reviewed following labs and imaging studies   CBC Recent Labs  Lab 08/28/24 1613 08/29/24 0329 08/31/24  9573 09/01/24 0342  WBC 22.2* 26.3* 22.8* 13.5*  HGB 11.7* 11.0* 10.7* 10.7*  HCT 34.4* 32.3* 31.9* 32.1*  PLT 347 327 308 400  MCV 83.7 84.6 84.4 82.9  MCH 28.5 28.8 28.3 27.6  MCHC 34.0 34.1 33.5 33.3  RDW 15.1 14.8 15.1 15.0  LYMPHSABS 1.0  --   --   --   MONOABS 1.8*  --   --   --   EOSABS 1.2*  --   --   --   BASOSABS 0.1  --   --   --     Recent Labs  Lab 08/28/24 1613 08/28/24 1822 08/29/24 0326 08/29/24 0329 08/31/24 0426 09/01/24 0342  NA 135  --  135  --  134* 139  K 4.5  --  4.6  --  4.5 3.8  CL 99  --  100  --  103 106  CO2 22  --  21*  --  17* 21*  GLUCOSE 177*  --  195*  --  253* 97  BUN 41*  --  41*  --  53* 55*  CREATININE 3.79*  --  3.55*  --  3.52* 3.81*  CALCIUM 9.2  --  8.3*  --  8.1* 8.1*  AST 16  --   --   --  14*  --   ALT 17  --   --   --  14  --   ALKPHOS 98  --   --   --  71  --   BILITOT 0.5  --   --   --  0.7  --   ALBUMIN 3.7  --   --   --  2.4*  --   MG  --   --  1.6*  --  2.5*  --   LATICACIDVEN 2.4* 1.4  --   --   --   --   INR 1.0  --   --   --   --   --   HGBA1C  --   --   --  8.4*  --   --      ------------------------------------------------------------------------------------------------------------------ No results for input(s): CHOL, HDL, LDLCALC, TRIG, CHOLHDL, LDLDIRECT in the last 72 hours.  Lab Results  Component Value Date   HGBA1C 8.4 (H) 08/29/2024   ------------------------------------------------------------------------------------------------------------------ No results for input(s): TSH, T4TOTAL, T3FREE, THYROIDAB in the last 72 hours.  Invalid input(s): FREET3  Cardiac Enzymes No results for input(s): CKMB, TROPONINI, MYOGLOBIN in the last 168 hours.  Invalid input(s): CK ------------------------------------------------------------------------------------------------------------------    Component Value Date/Time   BNP 295.6 (H) 01/20/2024 1042    CBG: Recent Labs  Lab 08/31/24 2113 09/01/24 0116 09/01/24 0509 09/01/24 0624 09/01/24 0807  GLUCAP 223* 131* 68* 77 97    Recent Results (from the past 240 hours)  Culture, blood (Routine x 2)     Status: None (Preliminary result)   Collection Time: 08/28/24  4:15 PM   Specimen: BLOOD LEFT FOREARM  Result Value Ref Range Status   Specimen Description   Final    BLOOD LEFT FOREARM Performed at [facility], [address], [place], [place] [address]    Special Requests   Final    BOTTLES DRAWN AEROBIC AND ANAEROBIC Blood Culture adequate volume Performed at [facility], [address] [place], [place] [address]    Culture   Final    NO GROWTH 4 DAYS Performed at [facility], [address], [place],  [place] [address]    Report Status PENDING  Incomplete  Culture, blood (Routine x 2)     Status: None (Preliminary result)   Collection Time: 08/28/24  4:16 PM   Specimen: BLOOD RIGHT FOREARM  Result Value Ref Range Status   Specimen Description   Final    BLOOD RIGHT FOREARM Performed at [facility], [address] [place], [place] [address]    Special Requests   Final    BOTTLES DRAWN AEROBIC AND ANAEROBIC Blood Culture adequate volume Performed at [facility], [address], [place], [place] [address]    Culture   Final    NO GROWTH 4 DAYS Performed at [facility], [address] [place], [place] [address]    Report Status PENDING  Incomplete  Aerobic/Anaerobic Culture w Gram Stain (surgical/deep wound)     Status: None (Preliminary result)   Collection Time: 08/28/24 10:16 PM   Specimen: Path Tissue  Result Value Ref Range Status   Specimen Description TISSUE  Final   Special Requests PERINEUM TISSUE CULTURE SPEC A  Final   Gram Stain   Final    FEW WBC PRESENT, PREDOMINANTLY PMN MODERATE GRAM POSITIVE COCCI RARE GRAM NEGATIVE RODS    Culture   Final    ABUNDANT STREPTOCOCCUS CONSTELLATUS RARE STAPHYLOCOCCUS AUREUS SUSCEPTIBILITIES TO FOLLOW FEW PREVOTELLA BIVIA BETA LACTAMASE POSITIVE Performed at [facility], [address], [place], [place] [address]    Report Status PENDING  Incomplete  MRSA Next Gen by PCR, Nasal     Status: None   Collection Time: 08/28/24 11:42 PM   Specimen: Nasal Mucosa; Nasal Swab  Result Value Ref Range Status   MRSA by PCR Next Gen NOT DETECTED NOT DETECTED Final    Comment: (NOTE) The GeneXpert MRSA Assay (FDA approved for NASAL specimens only), is one component of a comprehensive MRSA colonization surveillance program. It is not intended to diagnose MRSA infection nor to guide or monitor treatment for MRSA infections. Test performance is not FDA approved in patients less than 54 years old. Performed at [facility], [address], [place], [place] [address]   Culture, blood (Routine X 2) w Reflex to ID Panel     Status: None (Preliminary result)   Collection Time: 08/29/24  3:29 AM   Specimen: BLOOD  Result Value Ref Range Status   Specimen Description BLOOD SITE NOT SPECIFIED  Final   Special Requests    Final    BOTTLES DRAWN AEROBIC AND ANAEROBIC Blood Culture results may not be optimal due to an inadequate volume of blood received in culture bottles   Culture   Final    NO GROWTH 3 DAYS Performed at [facility], [address] [place], [place] [address]    Report Status PENDING  Incomplete  Culture, blood (Routine X 2) w Reflex to ID Panel     Status: None (Preliminary result)   Collection Time: 08/29/24  3:31 AM   Specimen: BLOOD  Result Value Ref Range Status   Specimen Description BLOOD SITE NOT SPECIFIED  Final   Special Requests   Final    BOTTLES DRAWN AEROBIC AND ANAEROBIC Blood Culture results may not be optimal due to an inadequate volume of blood received in culture bottles   Culture   Final    NO GROWTH 3 DAYS Performed at [facility], [address], [place], [place] [address]    Report Status PENDING  Incomplete     Radiology Studies: No results found.   Nilda Fendt, MD, PhD [facility]  Between 7 am - 7 pm I am available, please contact me via Amion (for emergencies) or Securechat (non urgent messages)  Between 7 pm - 7 am I am not available, please contact night coverage MD/APP via Amion

## 2024-09-02 DIAGNOSIS — E1165 Type 2 diabetes mellitus with hyperglycemia: Secondary | ICD-10-CM | POA: Diagnosis not present

## 2024-09-02 DIAGNOSIS — B9561 Methicillin susceptible Staphylococcus aureus infection as the cause of diseases classified elsewhere: Secondary | ICD-10-CM | POA: Diagnosis not present

## 2024-09-02 DIAGNOSIS — M726 Necrotizing fasciitis: Secondary | ICD-10-CM | POA: Diagnosis not present

## 2024-09-02 DIAGNOSIS — N493 Fournier gangrene: Secondary | ICD-10-CM | POA: Diagnosis not present

## 2024-09-02 LAB — COMPREHENSIVE METABOLIC PANEL WITH GFR
ALT: 18 U/L (ref 0–44)
AST: 18 U/L (ref 15–41)
Albumin: 2.2 g/dL — ABNORMAL LOW (ref 3.5–5.0)
Alkaline Phosphatase: 67 U/L (ref 38–126)
Anion gap: 11 (ref 5–15)
BUN: 53 mg/dL — ABNORMAL HIGH (ref 8–23)
CO2: 20 mmol/L — ABNORMAL LOW (ref 22–32)
Calcium: 7.9 mg/dL — ABNORMAL LOW (ref 8.9–10.3)
Chloride: 107 mmol/L (ref 98–111)
Creatinine, Ser: 3.55 mg/dL — ABNORMAL HIGH (ref 0.61–1.24)
GFR, Estimated: 18 mL/min — ABNORMAL LOW (ref >60)
Glucose, Bld: 244 mg/dL — ABNORMAL HIGH (ref 70–99)
Potassium: 3.8 mmol/L (ref 3.5–5.1)
Sodium: 138 mmol/L (ref 135–145)
Total Bilirubin: 0.8 mg/dL (ref 0.0–1.2)
Total Protein: 5.7 g/dL — ABNORMAL LOW (ref 6.5–8.1)

## 2024-09-02 LAB — CBC
HCT: 31.7 % — ABNORMAL LOW (ref 39.0–52.0)
Hemoglobin: 10.5 g/dL — ABNORMAL LOW (ref 13.0–17.0)
MCH: 27.8 pg (ref 26.0–34.0)
MCHC: 33.1 g/dL (ref 30.0–36.0)
MCV: 83.9 fL (ref 80.0–100.0)
Platelets: 411 K/uL — ABNORMAL HIGH (ref 150–400)
RBC: 3.78 MIL/uL — ABNORMAL LOW (ref 4.22–5.81)
RDW: 14.8 % (ref 11.5–15.5)
WBC: 11 K/uL — ABNORMAL HIGH (ref 4.0–10.5)
nRBC: 0 % (ref 0.0–0.2)

## 2024-09-02 LAB — CULTURE, BLOOD (ROUTINE X 2)
Culture: NO GROWTH
Culture: NO GROWTH
Special Requests: ADEQUATE
Special Requests: ADEQUATE

## 2024-09-02 LAB — MAGNESIUM: Magnesium: 2.5 mg/dL — ABNORMAL HIGH (ref 1.7–2.4)

## 2024-09-02 LAB — PHOSPHORUS: Phosphorus: 3.4 mg/dL (ref 2.5–4.6)

## 2024-09-02 LAB — GLUCOSE, CAPILLARY
Glucose-Capillary: 244 mg/dL — ABNORMAL HIGH (ref 70–99)
Glucose-Capillary: 237 mg/dL — ABNORMAL HIGH (ref 70–99)
Glucose-Capillary: 317 mg/dL — ABNORMAL HIGH (ref 70–99)
Glucose-Capillary: 216 mg/dL — ABNORMAL HIGH (ref 70–99)

## 2024-09-02 LAB — AEROBIC/ANAEROBIC CULTURE W GRAM STAIN (SURGICAL/DEEP WOUND): Culture: NO GROWTH

## 2024-09-02 MED ORDER — INSULIN GLARGINE-YFGN 100 UNIT/ML ~~LOC~~ SOLN
6.0000 [IU] | Freq: Once | SUBCUTANEOUS | Status: AC
  Administered 2024-09-02: 6 [IU] via SUBCUTANEOUS
  Filled 2024-09-02: qty 0.06

## 2024-09-02 MED ORDER — INSULIN GLARGINE-YFGN 100 UNIT/ML ~~LOC~~ SOLN
10.0000 [IU] | Freq: Every day | SUBCUTANEOUS | Status: DC
  Administered 2024-09-03: 10 [IU] via SUBCUTANEOUS
  Filled 2024-09-02: qty 0.1

## 2024-09-02 MED ORDER — INSULIN GLARGINE-YFGN 100 UNIT/ML ~~LOC~~ SOLN: 6.0000 [IU] | Freq: Once | SUBCUTANEOUS | Status: DC

## 2024-09-02 MED ORDER — INSULIN GLARGINE 100 UNIT/ML ~~LOC~~ SOLN: 10.0000 [IU] | Freq: Every day | SUBCUTANEOUS | Status: DC

## 2024-09-02 NOTE — Progress Notes (Signed)
 PROGRESS NOTE    Brendan [name].  FMW:[id number] DOB: [birth year] DOA: 08/28/2024 PCP: Rexanne Ingle, MD  Chief Complaint  Patient presents with   Groin Swelling   Wound Infection    Brief Narrative:   68 year old male with history of DM2, HTN, hypothyroidism, OSA, obesity, CKD 4, chronic diastolic CHF who came into the hospital with groin pain and swelling. He had Rollen Selders boil behind his testicles which started draining on 10/9, and this was associated with scrotal and penile swelling. He was found to have necrotizing fasciitis, general surgery as well as urology was consulted and emergently was taken to the OR upon presentation on 10/10 and was monitored in the ICU afterwards. Upon stability he was transferred to the hospitalist service on 10/12.   Assessment & Plan:   Principal Problem:   Necrotizing fasciitis (HCC) Active Problems:   Sepsis (HCC)  Sepsis due to Necrotizing Fasciitis of Scrotum/Perineum On farxiga  prior to admission, this will be discontinued at discharge S/p surgery 10/10 and 10/12 for I&D 10/10 culture with strep constellatus, mssa, prevotella bivia, beta lactamase positive  Appreciate ID assistance, continue linezolid/unasyn  Continue dressing changes BID or when soiled - RN to teach wife wound care  T2DM Basal + SSI - adjust as needed Stop farxiga  in setting of perineal infection above [place]  currently on hold while inpatient  Hypertension Currently on hydralazine , coreg  Home finerenone  currently on hold Entresto  currently on hold  Torsemide  on hold  AKI on CKD IV Creatinine appears worse than recent baseline (03/2024 baseline creatinine appears to be around 3) Creatinine has trended around 3.5 - 3.8 since presentation (? Progression of CKD vs AKI) UA with 100 mg/dl protein, 0-5 RBC.  CT pelvis without abnormality visualized in urinary tract (in pelvis).  Holding finerenone , entresto , torsemide  for now   HFpEF Follows with Dr. Fontaine Entresto ,  torsemide , finerenone  on hold  Coreg  Appears euvolemic   Hypothyroidism synthroid   OSA CPAP  Hx Prostate Cancer PSA <0.02 Follow with urology outpatient  Obesity Body mass index is 30.58 kg/m.    DVT prophylaxis: heparin  Code Status: full Family Communication: none Disposition:   Status is: Inpatient Remains inpatient appropriate because: need for continued care   Consultants:  General surgery Urology ID  Procedures:  10/12 Procedure:  Debridement of perineal wound (final dimensions: 15cm 10cm x 3cm)  10/10  SHARP EXCISIONAL DEBRIDEMENT USING SCALPEL AND ELECTROCAUTERY TO REMOVE SKIN AND SUBCUTANEOUS TISSUE OF THE PERINEUM AND POSTERIOR SCROTUM  (12 CM ANTERIOR/POSTERIOR BY 4 CM MEDIAL/LATERAL BY 4CM DEEP TOTAL DEBRIDEMENT)   Antimicrobials:  Anti-infectives (From admission, onward)    Start     Dose/Rate Route Frequency Ordered Stop   09/02/24 0500  Ampicillin-Sulbactam (UNASYN) 3 g in sodium chloride  0.9 % 100 mL IVPB        3 g 200 mL/hr over 30 Minutes Intravenous Every 12 hours 09/01/24 1003     08/31/24 1030  metroNIDAZOLE (FLAGYL) tablet 500 mg        500 mg Oral Every 12 hours 08/31/24 0933 09/01/24 2150   08/31/24 1030  linezolid (ZYVOX) tablet 600 mg        600 mg Oral Every 12 hours 08/31/24 0933     08/30/24 1000  linezolid (ZYVOX) IVPB 600 mg  Status:  Discontinued        600 mg 300 mL/hr over 60 Minutes Intravenous Every 12 hours 08/29/24 1349 08/31/24 0933   08/29/24 0600  vancomycin (VANCOREADY) IVPB 1500 mg/300 mL  Status:  Discontinued        1,500 mg 150 mL/hr over 120 Minutes Intravenous Every 48 hours 08/29/24 0100 08/29/24 1404   08/29/24 0400  ceFEPIme (MAXIPIME) 2 g in sodium chloride  0.9 % 100 mL IVPB  Status:  Discontinued        2 g 200 mL/hr over 30 Minutes Intravenous Every 24 hours 08/29/24 0100 09/01/24 1001   08/29/24 0030  metroNIDAZOLE (FLAGYL) IVPB 500 mg  Status:  Discontinued        500 mg 100 mL/hr over 60 Minutes  Intravenous Every 12 hours 08/29/24 0025 08/31/24 0933   08/28/24 1815  piperacillin-tazobactam (ZOSYN) IVPB 3.375 g        3.375 g 100 mL/hr over 30 Minutes Intravenous  Once 08/28/24 1805 08/28/24 1908   08/28/24 1815  linezolid (ZYVOX) IVPB 600 mg        600 mg 300 mL/hr over 60 Minutes Intravenous  Once 08/28/24 1805 08/28/24 1923   08/28/24 1730  cefTRIAXone (ROCEPHIN) 2 g in sodium chloride  0.9 % 100 mL IVPB        2 g 200 mL/hr over 30 Minutes Intravenous  Once 08/28/24 1724 08/28/24 1821   08/28/24 1715  cefTRIAXone (ROCEPHIN) injection 2 g  Status:  Discontinued        2 g Intramuscular  Once 08/28/24 1705 08/28/24 1724       Subjective: No new complaints Pain is controlled No CP or SOB  Objective: Vitals:   09/01/24 2200 09/02/24 0600 09/02/24 0805 09/02/24 0816  BP: (!) 185/91 (!) (P) 162/79 (!) 162/79 (!) 169/83  Pulse: 74 (P) 71  72  Resp:  (P) 18  17  Temp: 97.7 F (36.5 C)   97.7 F (36.5 C)  TempSrc: Oral     SpO2: 99% (P) 100%  100%  Weight:      Height:        Intake/Output Summary (Last 24 hours) at 09/02/2024 0850 Last data filed at 09/01/2024 2200 Gross per 24 hour  Intake 716 ml  Output 1300 ml  Net -584 ml   Filed Weights   08/28/24 1539 08/29/24 0500 08/30/24 1111  Weight: 129.3 kg 108.1 kg 108.1 kg    Examination:  General exam: Appears calm and comfortable  Respiratory system: Clear to auscultation. Respiratory effort normal. Cardiovascular system: RRR Gastrointestinal system: s/nt/nd Dressing to scrotum  Central nervous system: Alert and oriented. No focal neurological deficits. Extremities: no LEE    Data Reviewed: I have personally reviewed following labs and imaging studies  CBC: Recent Labs  Lab 08/28/24 1613 08/29/24 0329 08/31/24 0426 09/01/24 0342 09/02/24 0148  WBC 22.2* 26.3* 22.8* 13.5* 11.0*  NEUTROABS 18.0*  --   --   --   --   HGB 11.7* 11.0* 10.7* 10.7* 10.5*  HCT 34.4* 32.3* 31.9* 32.1* 31.7*  MCV 83.7  84.6 84.4 82.9 83.9  PLT 347 327 308 400 411*    Basic Metabolic Panel: Recent Labs  Lab 08/28/24 1613 08/29/24 0326 08/31/24 0426 09/01/24 0342 09/02/24 0148  NA 135 135 134* 139 138  K 4.5 4.6 4.5 3.8 3.8  CL 99 100 103 106 107  CO2 22 21* 17* 21* 20*  GLUCOSE 177* 195* 253* 97 244*  BUN 41* 41* 53* 55* 53*  CREATININE 3.79* 3.55* 3.52* 3.81* 3.55*  CALCIUM 9.2 8.3* 8.1* 8.1* 7.9*  MG  --  1.6* 2.5*  --  2.5*  PHOS  --   --  4.6  --  3.4    GFR: Estimated Creatinine Clearance: 25.7 mL/min (Omri Bertran) (by C-G formula based on SCr of 3.55 mg/dL (H)).  Liver Function Tests: Recent Labs  Lab 08/28/24 1613 08/31/24 0426 09/02/24 0148  AST 16 14* 18  ALT 17 14 18   ALKPHOS 98 71 67  BILITOT 0.5 0.7 0.8  PROT 7.2 5.9* 5.7*  ALBUMIN 3.7 2.4* 2.2*    CBG: Recent Labs  Lab 09/01/24 0807 09/01/24 1143 09/01/24 1541 09/01/24 2112 09/02/24 0811  GLUCAP 97 157* 216* 236* 244*     Recent Results (from the past 240 hours)  Culture, blood (Routine x 2)     Status: None   Collection Time: 08/28/24  4:15 PM   Specimen: BLOOD LEFT FOREARM  Result Value Ref Range Status   Specimen Description   Final    BLOOD LEFT FOREARM Performed at [facility], [address] [place], [place] [address]    Special Requests   Final    BOTTLES DRAWN AEROBIC AND ANAEROBIC Blood Culture adequate volume Performed at [facility], [address], [place], [place] [address]    Culture   Final    NO GROWTH 5 DAYS Performed at [facility], [address] [place], [place] [address]    Report Status 09/02/2024 FINAL  Final  Culture, blood (Routine x 2)     Status: None   Collection Time: 08/28/24  4:16 PM   Specimen: BLOOD RIGHT FOREARM  Result Value Ref Range Status   Specimen Description   Final    BLOOD RIGHT FOREARM Performed at [facility], [address], [place], [place] [address]    Special Requests   Final    BOTTLES DRAWN AEROBIC AND  ANAEROBIC Blood Culture adequate volume Performed at [facility], [address], [place], [place] [address]    Culture   Final    NO GROWTH 5 DAYS Performed at [facility], [address] [place], [place] [address]    Report Status 09/02/2024 FINAL  Final  Aerobic/Anaerobic Culture w Gram Stain (surgical/deep wound)     Status: None (Preliminary result)   Collection Time: 08/28/24 10:16 PM   Specimen: Path Tissue  Result Value Ref Range Status   Specimen Description TISSUE  Final   Special Requests PERINEUM TISSUE CULTURE SPEC Cyndie Woodbeck  Final   Gram Stain   Final    FEW WBC PRESENT, PREDOMINANTLY PMN MODERATE GRAM POSITIVE COCCI RARE GRAM NEGATIVE RODS    Culture   Final    ABUNDANT STREPTOCOCCUS CONSTELLATUS RARE STAPHYLOCOCCUS AUREUS FEW PREVOTELLA BIVIA BETA LACTAMASE POSITIVE Performed at [facility], [address], [place], [place] [address]    Report Status PENDING  Incomplete   Organism ID, Bacteria STAPHYLOCOCCUS AUREUS  Final      Susceptibility   Staphylococcus aureus - MIC*    CIPROFLOXACIN <=0.5 SENSITIVE Sensitive     ERYTHROMYCIN <=0.25 SENSITIVE Sensitive     GENTAMICIN <=0.5 SENSITIVE Sensitive     OXACILLIN 0.5 SENSITIVE Sensitive     TETRACYCLINE <=1 SENSITIVE Sensitive     VANCOMYCIN 1 SENSITIVE Sensitive     TRIMETH/SULFA <=10 SENSITIVE Sensitive     CLINDAMYCIN <=0.25 SENSITIVE Sensitive     RIFAMPIN <=0.5 SENSITIVE Sensitive     Inducible Clindamycin NEGATIVE Sensitive     LINEZOLID 2 SENSITIVE Sensitive     * RARE STAPHYLOCOCCUS AUREUS  MRSA Next Gen by PCR, Nasal     Status:  None   Collection Time: 08/28/24 11:42 PM   Specimen: Nasal Mucosa; Nasal Swab  Result Value Ref Range Status   MRSA by PCR Next Gen NOT DETECTED NOT DETECTED Final    Comment: (NOTE) The GeneXpert MRSA Assay (FDA approved for NASAL specimens only), is one component of Josanna Hefel comprehensive MRSA colonization surveillance program. It is not intended  to diagnose MRSA infection nor to guide or monitor treatment for MRSA infections. Test performance is not FDA approved in patients less than 20 years old. Performed at [facility], [address], [place], [place] [address]   Culture, blood (Routine X 2) w Reflex to ID Panel     Status: None (Preliminary result)   Collection Time: 08/29/24  3:29 AM   Specimen: BLOOD  Result Value Ref Range Status   Specimen Description BLOOD SITE NOT SPECIFIED  Final   Special Requests   Final    BOTTLES DRAWN AEROBIC AND ANAEROBIC Blood Culture results may not be optimal due to an inadequate volume of blood received in culture bottles   Culture   Final    NO GROWTH 4 DAYS Performed at [facility], [address], [place], [place] [address]    Report Status PENDING  Incomplete  Culture, blood (Routine X 2) w Reflex to ID Panel     Status: None (Preliminary result)   Collection Time: 08/29/24  3:31 AM   Specimen: BLOOD  Result Value Ref Range Status   Specimen Description BLOOD SITE NOT SPECIFIED  Final   Special Requests   Final    BOTTLES DRAWN AEROBIC AND ANAEROBIC Blood Culture results may not be optimal due to an inadequate volume of blood received in culture bottles   Culture   Final    NO GROWTH 4 DAYS Performed at [facility], [address] [place], [place] [address]    Report Status PENDING  Incomplete         Radiology Studies: DG Abd Portable 1V Result Date: 09/01/2024 CLINICAL DATA:  86105 Emesis 86105 [id number] Abdominal distention [id number] Constipation [id number] Vomiting this morning.  History of gastric band. EXAM: DG ABD PORTABLE 1V COMPARISON:  Radiographs 04/07/2018.  Abdominopelvic CT 07/09/2007. FINDINGS: 0904 hours. Single supine view of the abdomen demonstrates Pluma Diniz laparoscopic gastric band in grossly stable positioning. The tubing is suboptimally visualized on this portable examination. There is moderate distention of the stomach. No significant small or  large bowel distention identified. No supine evidence of free intraperitoneal air. Multilevel lumbar spondylosis noted. IMPRESSION: 1. Nonspecific, moderate distention of the stomach. Correlate clinically to exclude gastric outlet obstruction. 2. No evidence of bowel obstruction or free intraperitoneal air. 3. Grossly stable positioning of the laparoscopic gastric band. Electronically Signed   By: Elsie Perone M.D.   On: 09/01/2024 14:58        Scheduled Meds:  carvedilol   25 mg Oral BID WC   Chlorhexidine  Gluconate Cloth  6 each Topical Daily   dorzolamide  1 drop Both Eyes Daily   heparin  injection (subcutaneous)  5,000 Units Subcutaneous Q8H   hydrALAZINE   50 mg Oral Q12H   insulin  aspart  0-15 Units Subcutaneous TID WC   insulin  aspart  0-5 Units Subcutaneous QHS   insulin  glargine  4 Units Subcutaneous Daily   levothyroxine   125 mcg Oral Q0600   linezolid  600 mg Oral Q12H   timolol   1 drop Both Eyes Daily   Continuous Infusions:  ampicillin-sulbactam (UNASYN) IV 3  g (09/02/24 0556)     LOS: 5 days    Time spent: over 30 min     Meliton Monte, MD [facility]   To contact the attending provider between 7A-7P or the covering provider during after hours 7P-7A, please log into the web site [email or web address] and access using universal [place] password for that web site. If you do not have the password, please call the hospital operator.  09/02/2024, 8:51 AM

## 2024-09-02 NOTE — TOC Progression Note (Signed)
 Transition of Care (TOC) - Progression Note    Patient Details  Name: Brendan [name]. MRN: [id number] Date of Birth: Apr 30, 1956  Transition of Care [facility]) CM/SW Contact  Rosaline JONELLE Joe, RN Phone Number: 09/02/2024, 4:22 PM  Clinical Narrative:    CM met with the patient and daughter at the bedside.  The patient's wife is coming in to the hospital later on this afternoon to perform dressing change teaching with the bedside RN.  The patient states that he is hopeful that he can return home this week with home health services.  I offered Medicare choice regarding home health services and patient did not have a preference but has [facility] for DME.  [facility] was called and [place], RNCM accepted for [facility] RN, OT,  PT - HH orders placed to be co-signed by MD.  The patient needs bariatric Rolator and bariatric 3:1 - orders placed to be co-signed by MD.  Patient requests [employer] - [employer] called and once orders are signed - they will deliver both to the bedside.  Patient plans to return home with family when stable.  Wife is coming this evening to learn wound care.                     Expected Discharge Plan and Services                                               Social Drivers of Health (SDOH) Interventions SDOH Screenings   Food Insecurity: Patient Unable To Answer (08/31/2024)  Housing: Low Risk  (09/01/2024)  Transportation Needs: No Transportation Needs (08/31/2024)  Utilities: At Risk (08/31/2024)  Financial Resource Strain: Low Risk  (01/08/2023)  Social Connections: Socially Integrated (08/31/2024)  Tobacco Use: Low Risk  (08/30/2024)    Readmission Risk Interventions    09/01/2024   11:34 AM  Readmission Risk Prevention Plan  Transportation Screening Complete  Home Care Screening Complete  Medication Review (RN CM) Referral to Pharmacy

## 2024-09-02 NOTE — Plan of Care (Signed)
  Problem: Education: Goal: Knowledge of General Education information will improve Description: Including pain rating scale, medication(s)/side effects and non-pharmacologic comfort measures Outcome: Progressing   Problem: Health Behavior/Discharge Planning: Goal: Ability to manage health-related needs will improve Outcome: Progressing   Problem: Clinical Measurements: Goal: Ability to maintain clinical measurements within normal limits will improve Outcome: Progressing Goal: Will remain free from infection Outcome: Progressing Goal: Diagnostic test results will improve Outcome: Progressing Goal: Cardiovascular complication will be avoided Outcome: Progressing   Problem: Activity: Goal: Risk for activity intolerance will decrease Outcome: Progressing   Problem: Nutrition: Goal: Adequate nutrition will be maintained Outcome: Progressing   Problem: Coping: Goal: Level of anxiety will decrease Outcome: Progressing   Problem: Elimination: Goal: Will not experience complications related to bowel motility Outcome: Progressing Goal: Will not experience complications related to urinary retention Outcome: Progressing   Problem: Pain Managment: Goal: General experience of comfort will improve and/or be controlled Outcome: Progressing   Problem: Safety: Goal: Ability to remain free from injury will improve Outcome: Progressing   Problem: Skin Integrity: Goal: Risk for impaired skin integrity will decrease Outcome: Progressing   Problem: Education: Goal: Ability to describe self-care measures that may prevent or decrease complications (Diabetes Survival Skills Education) will improve Outcome: Progressing   Problem: Coping: Goal: Ability to adjust to condition or change in health will improve Outcome: Progressing   Problem: Fluid Volume: Goal: Ability to maintain a balanced intake and output will improve Outcome: Progressing   Problem: Health Behavior/Discharge  Planning: Goal: Ability to identify and utilize available resources and services will improve Outcome: Progressing Goal: Ability to manage health-related needs will improve Outcome: Progressing   Problem: Metabolic: Goal: Ability to maintain appropriate glucose levels will improve Outcome: Progressing   Problem: Nutritional: Goal: Maintenance of adequate nutrition will improve Outcome: Progressing Goal: Progress toward achieving an optimal weight will improve Outcome: Progressing   Problem: Skin Integrity: Goal: Risk for impaired skin integrity will decrease Outcome: Progressing   Problem: Tissue Perfusion: Goal: Adequacy of tissue perfusion will improve Outcome: Progressing

## 2024-09-02 NOTE — Progress Notes (Signed)
    Durable Medical Equipment  (From admission, onward)           Start     Ordered   09/02/24 1602  For home use only DME Bedside commode  Once       Comments: Patient needs bariatric 3:1 - patient unable to mobilize to bathroom facilities - weight 108 kd, height - 6'2  Question Answer Comment  Patient needs a bedside commode to treat with the following condition Necrotizing fasciitis [facility])   Patient needs a bedside commode to treat with the following condition Sepsis [facility])   Patient needs a bedside commode to treat with the following condition Obesity      09/02/24 1602   09/02/24 1559  For home use only DME 4 wheeled rolling walker with seat  Once       Comments: Needs bariatric rollator - weight 108 kg, 6'2  Question Answer Comment  Patient needs a walker to treat with the following condition Necrotizing fasciitis [facility])   Patient needs a walker to treat with the following condition Sepsis [facility])   Patient needs a walker to treat with the following condition Obesity      09/02/24 1602

## 2024-09-02 NOTE — Progress Notes (Signed)
 [facility]  Date of Admission:  08/28/2024     Reason for Follow Up: Necrotizing fasciitis (HCC)  Total days of antibiotics 6         ASSESSMENT:  [name] is a African-American male with history of diabetes and chronic kidney disease presenting with acute onset scrotal pain and swelling and found to have necrotizing fasciitis now status post debridement.  Surgical specimens are polymicrobial with Streptococcus constellatus, Staphylococcus aureus, and Prevotella bivia.  Sensitivities remain pending.   [name] Staphylococcus aureus is MSSA and will discontinue linezolid. Tolerating antibiotics with no adverse side effects. No additional surgical intervention planned with continued wound care. Discussed plan of care to continue with ampicillin-sulbactam and change to amoxicillin -clavulante prior to discharge for total treatment duration of 14 days from last surgery on 08/30/24 with end date of 09/13/24. Continue post-operative wound care per General Surgery recommendations. Continue standard/universal precautions with remaining medical and supportive are per Internal Medicine.   PLAN:  Discontinue linezolid.  Continue current dose of ampicillin-sulbactam with plan to transition to oral amoxicillin -clavulante prior to discharge.  Post-operative wound care per General Surgery. Anticipate 2 weeks treatment from last date of surgery.  Standard/universal precautions.  Remaining medical and supportive care per Internal Medicine.   Principal Problem:   Necrotizing fasciitis (HCC) Active Problems:   Sepsis (HCC)    carvedilol   25 mg Oral BID WC   Chlorhexidine  Gluconate Cloth  6 each Topical Daily   dorzolamide  1 drop Both Eyes Daily   heparin  injection (subcutaneous)  5,000 Units Subcutaneous Q8H   hydrALAZINE   50 mg Oral Q12H   insulin  aspart  0-15 Units Subcutaneous TID WC   insulin  aspart  0-5 Units Subcutaneous QHS   [START ON 09/03/2024] insulin   glargine-yfgn  10 Units Subcutaneous Daily   levothyroxine   125 mcg Oral Q0600   timolol   1 drop Both Eyes Daily    SUBJECTIVE:  Afebrile overnight with no acute events and improved WBC count now at 11,000. Tolerating antibiotics with no adverse side effects.   Allergies  Allergen Reactions   Ace Inhibitors Cough   Maxidex [Dexamethasone] Other (See Comments)    Sexual dysfunction   Verapamil Other (See Comments)    constipation     Review of Systems: Review of Systems  Constitutional:  Negative for chills, fever and weight loss.  Respiratory:  Negative for cough, shortness of breath and wheezing.   Cardiovascular:  Negative for chest pain and leg swelling.  Gastrointestinal:  Negative for abdominal pain, constipation, diarrhea, nausea and vomiting.  Skin:  Negative for rash.      OBJECTIVE: Vitals:   09/02/24 0600 09/02/24 0805 09/02/24 0816 09/02/24 1156  BP: (!) (P) 162/79 (!) 162/79 (!) 169/83 (!) 147/71  Pulse: (P) 71  72 66  Resp: (P) 18  17   Temp:   97.7 F (36.5 C) 98.2 F (36.8 C)  TempSrc:      SpO2: (P) 100%  100% 100%  Weight:      Height:       Body mass index is 30.58 kg/m.  Physical Exam Constitutional:      General: He is not in acute distress.    Appearance: He is well-developed.  Cardiovascular:     Rate and Rhythm: Normal rate and regular rhythm.     Heart sounds: Normal heart sounds.  Pulmonary:     Effort: Pulmonary effort is normal.     Breath sounds: Normal breath  sounds.  Skin:    General: Skin is warm and dry.  Neurological:     Mental Status: He is alert and oriented to person, place, and time.     Lab Results Lab Results  Component Value Date   WBC 11.0 (H) 09/02/2024   HGB 10.5 (L) 09/02/2024   HCT 31.7 (L) 09/02/2024   MCV 83.9 09/02/2024   PLT 411 (H) 09/02/2024    Lab Results  Component Value Date   CREATININE 3.55 (H) 09/02/2024   BUN 53 (H) 09/02/2024   NA 138 09/02/2024   K 3.8 09/02/2024   CL 107  09/02/2024   CO2 20 (L) 09/02/2024    Lab Results  Component Value Date   ALT 18 09/02/2024   AST 18 09/02/2024   ALKPHOS 67 09/02/2024   BILITOT 0.8 09/02/2024     Microbiology: Recent Results (from the past 240 hours)  Culture, blood (Routine x 2)     Status: None   Collection Time: 08/28/24  4:15 PM   Specimen: BLOOD LEFT FOREARM  Result Value Ref Range Status   Specimen Description   Final    BLOOD LEFT FOREARM Performed at [facility], [address], [place], [place] [address]    Special Requests   Final    BOTTLES DRAWN AEROBIC AND ANAEROBIC Blood Culture adequate volume Performed at [facility], [address] [place], [place] [address]    Culture   Final    NO GROWTH 5 DAYS Performed at [facility], [address], [place], [place] [address]    Report Status 09/02/2024 FINAL  Final  Culture, blood (Routine x 2)     Status: None   Collection Time: 08/28/24  4:16 PM   Specimen: BLOOD RIGHT FOREARM  Result Value Ref Range Status   Specimen Description   Final    BLOOD RIGHT FOREARM Performed at [facility], [address] [place], [place] [address]    Special Requests   Final    BOTTLES DRAWN AEROBIC AND ANAEROBIC Blood Culture adequate volume Performed at [facility], [address], [place], [place] [address]    Culture   Final    NO GROWTH 5 DAYS Performed at [facility], [address] [place], [place] [address]    Report Status 09/02/2024 FINAL  Final  Aerobic/Anaerobic Culture w Gram Stain (surgical/deep wound)     Status: None   Collection Time: 08/28/24 10:16 PM   Specimen: Path Tissue  Result Value Ref Range Status   Specimen Description TISSUE  Final   Special Requests PERINEUM TISSUE CULTURE SPEC A  Final   Gram Stain   Final    FEW WBC PRESENT, PREDOMINANTLY PMN MODERATE GRAM POSITIVE COCCI RARE GRAM NEGATIVE RODS    Culture   Final    ABUNDANT STREPTOCOCCUS  CONSTELLATUS RARE STAPHYLOCOCCUS AUREUS FEW PREVOTELLA BIVIA BETA LACTAMASE POSITIVE Performed at [facility], [address] [place], [place] [address]    Report Status 09/02/2024 FINAL  Final   Organism ID, Bacteria STAPHYLOCOCCUS AUREUS  Final   Organism ID, Bacteria STREPTOCOCCUS CONSTELLATUS  Final      Susceptibility   Streptococcus constellatus - MIC*    PENICILLIN <=0.06 SENSITIVE Sensitive     CEFTRIAXONE 0.25 SENSITIVE Sensitive     LEVOFLOXACIN 0.5 SENSITIVE Sensitive     VANCOMYCIN 0.5 SENSITIVE Sensitive     * ABUNDANT STREPTOCOCCUS CONSTELLATUS   Staphylococcus aureus - MIC*  CIPROFLOXACIN <=0.5 SENSITIVE Sensitive     ERYTHROMYCIN <=0.25 SENSITIVE Sensitive     GENTAMICIN <=0.5 SENSITIVE Sensitive     OXACILLIN 0.5 SENSITIVE Sensitive     TETRACYCLINE <=1 SENSITIVE Sensitive     VANCOMYCIN 1 SENSITIVE Sensitive     TRIMETH/SULFA <=10 SENSITIVE Sensitive     CLINDAMYCIN <=0.25 SENSITIVE Sensitive     RIFAMPIN <=0.5 SENSITIVE Sensitive     Inducible Clindamycin NEGATIVE Sensitive     LINEZOLID 2 SENSITIVE Sensitive     * RARE STAPHYLOCOCCUS AUREUS  MRSA Next Gen by PCR, Nasal     Status: None   Collection Time: 08/28/24 11:42 PM   Specimen: Nasal Mucosa; Nasal Swab  Result Value Ref Range Status   MRSA by PCR Next Gen NOT DETECTED NOT DETECTED Final    Comment: (NOTE) The GeneXpert MRSA Assay (FDA approved for NASAL specimens only), is one component of a comprehensive MRSA colonization surveillance program. It is not intended to diagnose MRSA infection nor to guide or monitor treatment for MRSA infections. Test performance is not FDA approved in patients less than 69 years old. Performed at [facility], [address] [place], [place] [address]   Culture, blood (Routine X 2) w Reflex to ID Panel     Status: None (Preliminary result)   Collection Time: 08/29/24  3:29 AM   Specimen: BLOOD  Result Value Ref Range Status   Specimen Description  BLOOD SITE NOT SPECIFIED  Final   Special Requests   Final    BOTTLES DRAWN AEROBIC AND ANAEROBIC Blood Culture results may not be optimal due to an inadequate volume of blood received in culture bottles   Culture   Final    NO GROWTH 4 DAYS Performed at [facility], [address] [place], [place] [address]    Report Status PENDING  Incomplete  Culture, blood (Routine X 2) w Reflex to ID Panel     Status: None (Preliminary result)   Collection Time: 08/29/24  3:31 AM   Specimen: BLOOD  Result Value Ref Range Status   Specimen Description BLOOD SITE NOT SPECIFIED  Final   Special Requests   Final    BOTTLES DRAWN AEROBIC AND ANAEROBIC Blood Culture results may not be optimal due to an inadequate volume of blood received in culture bottles   Culture   Final    NO GROWTH 4 DAYS Performed at [facility], [address], [place], [place] [address]    Report Status PENDING  Incomplete    I have personally spent 27 minutes involved in face-to-face and non-face-to-face activities for this patient on the day of the visit. Professional time spent includes the following activities: preparing to see the patient (review of tests), performing a medically appropriate examination, ordering medications, communicating with other health care professionals, documenting clinical information in the EMR, communicating results and counseling patientregarding medication and plan of care, and care coordination.    Greg Lourdes Manning, NP [facility]  09/02/2024  12:19 PM

## 2024-09-02 NOTE — Progress Notes (Addendum)
 Progress Note  3 Days Post-Op  Subjective: Patient reports that pain is manageable. Reports loose stools. Mild nausea but is hoping to eat today. States his wife is going to perform dressing change today with RN assistance.   ROS  Per HPI  Objective: Vital signs in last 24 hours: Temp:  [97.4 F (36.3 C)-97.7 F (36.5 C)] 97.7 F (36.5 C) (10/15 0816) Pulse Rate:  [64-74] 72 (10/15 0816) Resp:  [17-18] 17 (10/15 0816) BP: (148-185)/(73-95) 169/83 (10/15 0816) SpO2:  [99 %-100 %] 100 % (10/15 0816) Last BM Date : 08/29/24  Intake/Output from previous day: 10/14 0701 - 10/15 0700 In: 716 [P.O.:716] Out: 1300 [Urine:1300] Intake/Output this shift: No intake/output data recorded.  PE: Physical exam completed with nursing staff present. General: Pleasant male who is sitting up in bed, NAD HEENT: Head is normocephalic, atraumatic.  Heart: HR normal. Lungs: Respiratory effort nonlabored. Abd: protuberant, upper abdominal tympany, abdomen is non-tender, no guarding.  Skin: wound packed this AM at 0600 - periwound is without cellulitis, induration, or fluctuance.  Psych: A&Ox3 with an appropriate affect.    Lab Results:  Recent Labs    09/01/24 0342 09/02/24 0148  WBC 13.5* 11.0*  HGB 10.7* 10.5*  HCT 32.1* 31.7*  PLT 400 411*   BMET Recent Labs    09/01/24 0342 09/02/24 0148  NA 139 138  K 3.8 3.8  CL 106 107  CO2 21* 20*  GLUCOSE 97 244*  BUN 55* 53*  CREATININE 3.81* 3.55*  CALCIUM 8.1* 7.9*   PT/INR No results for input(s): LABPROT, INR in the last 72 hours.  CMP     Component Value Date/Time   NA 138 09/02/2024 0148   NA 140 09/03/2022 0849   K 3.8 09/02/2024 0148   CL 107 09/02/2024 0148   CO2 20 (L) 09/02/2024 0148   GLUCOSE 244 (H) 09/02/2024 0148   BUN 53 (H) 09/02/2024 0148   BUN 26 09/03/2022 0849   CREATININE 3.55 (H) 09/02/2024 0148   CALCIUM 7.9 (L) 09/02/2024 0148   PROT 5.7 (L) 09/02/2024 0148   ALBUMIN 2.2 (L) 09/02/2024  0148   AST 18 09/02/2024 0148   ALT 18 09/02/2024 0148   ALKPHOS 67 09/02/2024 0148   BILITOT 0.8 09/02/2024 0148   GFRNONAA 18 (L) 09/02/2024 0148   GFRAA 29 (L) 08/25/2020 1144   Lipase  No results found for: LIPASE     Studies/Results: DG Abd Portable 1V Result Date: 09/01/2024 CLINICAL DATA:  86105 Emesis 86105 86085 Abdominal distention 86085 [id number] Constipation [id number] Vomiting this morning.  History of gastric band. EXAM: DG ABD PORTABLE 1V COMPARISON:  Radiographs 04/07/2018.  Abdominopelvic CT 07/09/2007. FINDINGS: 0904 hours. Single supine view of the abdomen demonstrates a laparoscopic gastric band in grossly stable positioning. The tubing is suboptimally visualized on this portable examination. There is moderate distention of the stomach. No significant small or large bowel distention identified. No supine evidence of free intraperitoneal air. Multilevel lumbar spondylosis noted. IMPRESSION: 1. Nonspecific, moderate distention of the stomach. Correlate clinically to exclude gastric outlet obstruction. 2. No evidence of bowel obstruction or free intraperitoneal air. 3. Grossly stable positioning of the laparoscopic gastric band. Electronically Signed   By: Elsie Perone M.D.   On: 09/01/2024 14:58    Anti-infectives: Anti-infectives (From admission, onward)    Start     Dose/Rate Route Frequency Ordered Stop   09/02/24 0500  Ampicillin-Sulbactam (UNASYN) 3 g in sodium chloride  0.9 % 100 mL IVPB  3 g 200 mL/hr over 30 Minutes Intravenous Every 12 hours 09/01/24 1003     08/31/24 1030  metroNIDAZOLE (FLAGYL) tablet 500 mg        500 mg Oral Every 12 hours 08/31/24 0933 09/01/24 2150   08/31/24 1030  linezolid (ZYVOX) tablet 600 mg        600 mg Oral Every 12 hours 08/31/24 0933     08/30/24 1000  linezolid (ZYVOX) IVPB 600 mg  Status:  Discontinued        600 mg 300 mL/hr over 60 Minutes Intravenous Every 12 hours 08/29/24 1349 08/31/24 0933   08/29/24 0600   vancomycin (VANCOREADY) IVPB 1500 mg/300 mL  Status:  Discontinued        1,500 mg 150 mL/hr over 120 Minutes Intravenous Every 48 hours 08/29/24 0100 08/29/24 1404   08/29/24 0400  ceFEPIme (MAXIPIME) 2 g in sodium chloride  0.9 % 100 mL IVPB  Status:  Discontinued        2 g 200 mL/hr over 30 Minutes Intravenous Every 24 hours 08/29/24 0100 09/01/24 1001   08/29/24 0030  metroNIDAZOLE (FLAGYL) IVPB 500 mg  Status:  Discontinued        500 mg 100 mL/hr over 60 Minutes Intravenous Every 12 hours 08/29/24 0025 08/31/24 0933   08/28/24 1815  piperacillin-tazobactam (ZOSYN) IVPB 3.375 g        3.375 g 100 mL/hr over 30 Minutes Intravenous  Once 08/28/24 1805 08/28/24 1908   08/28/24 1815  linezolid (ZYVOX) IVPB 600 mg        600 mg 300 mL/hr over 60 Minutes Intravenous  Once 08/28/24 1805 08/28/24 1923   08/28/24 1730  cefTRIAXone (ROCEPHIN) 2 g in sodium chloride  0.9 % 100 mL IVPB        2 g 200 mL/hr over 30 Minutes Intravenous  Once 08/28/24 1724 08/28/24 1821   08/28/24 1715  cefTRIAXone (ROCEPHIN) injection 2 g  Status:  Discontinued        2 g Intramuscular  Once 08/28/24 1705 08/28/24 1724        Assessment/Plan POD5: S/P debridement perineal wound 10/10 Dr. Lyndel POD 3: S/P debridement of perineal wound 08/30/2024 by Dr. Cordella Idler -Afebrile -WBC down-trending - No concerns of developing/remaining necrotic tissue. -Continue with dressing changes BID and when soiled. -Continue antibiotics per ID - continue wound care and patient/family wound care teaching. No further surgical needs. We will sign off. I have arranged wound check in our office in 2-3 weeks.  - nausea with moderate gastric distention on KUB yesterday, if this persists consider gastric emptying study and/or GI consult. Noted lap band in place.  FEN: CM; IVF per primary team VTE: SCDs, Heparin  injeection ID: Cefepime, linezolid, metronidazole    LOS: 5 days   I reviewed specialist notes,  hospitalist notes, last 24 h vitals and pain scores, last 48 h intake and output, last 24 h labs and trends, and last 24 h imaging results.   Almarie GORMAN Pringle, [facility] 09/02/2024, 8:48 AM Please see Amion for pager number during day hours 7:00am-4:30pm

## 2024-09-02 NOTE — Evaluation (Signed)
 Physical Therapy Evaluation Patient Details Name: Brendan [name]. MRN: [id number] DOB: 17-Jan-1956 Today's Date: 09/02/2024  History of Present Illness  Pt is a 68 y.o. male who presented 08/28/24 with perineal infection. He was found to have necrotizing fasciitis. S/p surgical debridement 10/10 & 10/12. PMH: DM2, HTN, hypothyroidism, OSA, obesity, CKD 4, chronic diastolic CHF, glaucoma, gynecomastia, HLD, vertigo, obesity, peripheral neuropathy, prostate cancer, sickle cell trait   Clinical Impression  Pt presents with condition above and deficits mentioned below, see PT Problem List. PTA, he was mod I using a SPC and holding onto furniture to mobilize, living with his wife in a 1-level house with 4 STE. Currently, the pt displays deficits in bil lower extremity strength (L weaker than R, per pt L has been worse than R chronically), balance, endurance, and power. The pt is currently requiring CGA for safety with transfers and to ambulate. While he is capable of ambulating with x1 UE support on the wall rail, he benefits from the walker to improve his stability, gait speed, and gait symmetry. Thus, recommending a bari rollator at d/c for energy conservation and safety. Recommend pt follow-up with HHPT at d/c to address his deficits above and reduce his risk for falls. He endorses x2 falls in the past 6 months PTA. Will continue to follow acutely.      If plan is discharge home, recommend the following: A little help with bathing/dressing/bathroom;Assistance with cooking/housework;Assist for transportation;Help with stairs or ramp for entrance   Can travel by private vehicle        Equipment Recommendations BSC/3in1;Rollator (4 wheels) (bariatric for all)  Recommendations for Other Services       Functional Status Assessment Patient has had a recent decline in their functional status and demonstrates the ability to make significant improvements in function in a reasonable and predictable amount  of time.     Precautions / Restrictions Precautions Precautions: Fall Precaution/Restrictions Comments: mesh underwear to support scrotum Restrictions Weight Bearing Restrictions Per Provider Order: No      Mobility  Bed Mobility               General bed mobility comments: Pt sitting EOB upon arrival and in chair at end of session    Transfers Overall transfer level: Needs assistance Equipment used: Rolling walker (2 wheels) Transfers: Sit to/from Stand Sit to Stand: Contact guard assist           General transfer comment: The first rep, pt stood from elevated EOB. The second rep, pt stood from recliner. Cues needed for hand placement. No LOB, extra effort to stand from lower surface, CGA for safety    Ambulation/Gait Ambulation/Gait assistance: Contact guard assist Gait Distance (Feet): 110 Feet Assistive device: Rolling walker (2 wheels) (wall rail) Gait Pattern/deviations: Step-through pattern, Decreased stride length, Trunk flexed Gait velocity: reduced Gait velocity interpretation: <1.31 ft/sec, indicative of household ambulator   General Gait Details: Pt ambulates slowly with noted instability with L knee but no appreciative buckling. Pt primarily used the RW for support but was able to progress to ambulating a short distance in the hall with L hand on wall rail. No LOB, CGA for safety  Stairs            Wheelchair Mobility     Tilt Bed    Modified Rankin (Stroke Patients Only)       Balance Overall balance assessment: Needs assistance, History of Falls Sitting-balance support: No upper extremity supported, Feet supported Sitting balance-Leahy Scale:  Good Sitting balance - Comments: sits EOB and eats without LOB or assistance   Standing balance support: Bilateral upper extremity supported, Single extremity supported, During functional activity Standing balance-Leahy Scale: Poor Standing balance comment: reliant on UE support, hx of x2  falls PTA                             Pertinent Vitals/Pain Pain Assessment Pain Assessment: No/denies pain    Home Living Family/patient expects to be discharged to:: Private residence Living Arrangements: Spouse/significant other Available Help at Discharge: Family;Available 24 hours/day Type of Home: House Home Access: Stairs to enter Entrance Stairs-Rails: Can reach both Entrance Stairs-Number of Steps: 4   Home Layout: One level Home Equipment: Cane - single point;Hand held shower head      Prior Function Prior Level of Function : Independent/Modified Independent;Driving             Mobility Comments: Uses SPC and holds onto furniture to mobilize; x2 falls in past 6 months when not using the cane 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     Extremity/Trunk Assessment   Upper Extremity Assessment Upper Extremity Assessment: Right hand dominant;Defer to OT evaluation    Lower Extremity Assessment Lower Extremity Assessment: Generalized weakness;LLE deficits/detail LLE Deficits / Details: L leg noted to be weaker than R at knee during functional mobility, reports hx of L leg issues    Cervical / Trunk Assessment Cervical / Trunk Assessment: Other exceptions Cervical / Trunk Exceptions: increased body habitus  Communication   Communication Communication: No apparent difficulties    Cognition Arousal: Alert Behavior During Therapy: WFL for tasks assessed/performed   PT - Cognitive impairments: No apparent impairments                         Following commands: Intact       Cueing Cueing Techniques: Verbal cues     General Comments General comments (skin integrity, edema, etc.): Verbally educated pt and daughter on uses of 3in1 and how to properly and safely manage a rollator. Educated them on pt mobilizing >/= 3x/day while admitted and sit up for each meal if able. They verbalized understanding.    Exercises     Assessment/Plan     PT Assessment Patient needs continued PT services  PT Problem List Decreased strength;Decreased activity tolerance;Decreased balance;Decreased mobility;Decreased knowledge of use of DME;Decreased skin integrity       PT Treatment Interventions DME instruction;Gait training;Stair training;Functional mobility training;Therapeutic exercise;Therapeutic activities;Balance training;Neuromuscular re-education;Patient/family education    PT Goals (Current goals can be found in the Care Plan section)  Acute Rehab PT Goals Patient Stated Goal: to improve PT Goal Formulation: With patient/family Time For Goal Achievement: 09/16/24 Potential to Achieve Goals: Good    Frequency Min 2X/week     Co-evaluation               AM-PAC PT 6 Clicks Mobility  Outcome Measure Help needed turning from your back to your side while in a flat bed without using bedrails?: A Little Help needed moving from lying on your back to sitting on the side of a flat bed without using bedrails?: A Little Help needed moving to and from a bed to a chair (including a wheelchair)?: A Little Help needed standing up from a chair using your arms (e.g., wheelchair or bedside chair)?: A Little Help needed to walk in hospital room?: A Little Help  needed climbing 3-5 steps with a railing? : A Little 6 Click Score: 18    End of Session Equipment Utilized During Treatment: Gait belt Activity Tolerance: Patient tolerated treatment well Patient left: in chair;with call bell/phone within reach;with family/visitor present (no chair alarm needed per RN) Nurse Communication: Mobility status (confirmed with RN no need for chair alarm) PT Visit Diagnosis: Unsteadiness on feet (R26.81);Other abnormalities of gait and mobility (R26.89);Muscle weakness (generalized) (M62.81);History of falling (Z91.81);Difficulty in walking, not elsewhere classified (R26.2)    Time: 8550-8478 PT Time Calculation (min) (ACUTE ONLY): 32  min   Charges:   PT Evaluation $PT Eval Low Complexity: 1 Low PT Treatments $Therapeutic Activity: 8-22 mins PT General Charges $$ ACUTE PT VISIT: 1 Visit         Theo Ferretti, PT, DPT Acute Rehabilitation Services  Office: [phone]   Theo CHRISTELLA Ferretti 09/02/2024, 3:36 PM

## 2024-09-02 NOTE — Plan of Care (Addendum)
 Patient calm and cooperative A&O X4. Two bowel movements this shift, dressing changed. Patient left with call bell in reach and side rails up.  Problem: Education: Goal: Knowledge of General Education information will improve Description: Including pain rating scale, medication(s)/side effects and non-pharmacologic comfort measures Outcome: Progressing   Problem: Health Behavior/Discharge Planning: Goal: Ability to manage health-related needs will improve Outcome: Progressing   Problem: Clinical Measurements: Goal: Ability to maintain clinical measurements within normal limits will improve Outcome: Progressing   Problem: Activity: Goal: Risk for activity intolerance will decrease Outcome: Progressing   Problem: Nutrition: Goal: Adequate nutrition will be maintained Outcome: Progressing   Problem: Coping: Goal: Level of anxiety will decrease Outcome: Progressing   Problem: Elimination: Goal: Will not experience complications related to bowel motility Outcome: Progressing   Problem: Pain Managment: Goal: General experience of comfort will improve and/or be controlled Outcome: Progressing   Problem: Safety: Goal: Ability to remain free from injury will improve Outcome: Progressing

## 2024-09-03 ENCOUNTER — Other Ambulatory Visit (HOSPITAL_COMMUNITY): Payer: Self-pay

## 2024-09-03 ENCOUNTER — Inpatient Hospital Stay (HOSPITAL_COMMUNITY)

## 2024-09-03 DIAGNOSIS — R609 Edema, unspecified: Secondary | ICD-10-CM

## 2024-09-03 DIAGNOSIS — M726 Necrotizing fasciitis: Secondary | ICD-10-CM | POA: Diagnosis not present

## 2024-09-03 LAB — CBC WITH DIFFERENTIAL/PLATELET
Basophils Absolute: 0.2 K/uL — ABNORMAL HIGH (ref 0.0–0.1)
Basophils Relative: 1 %
Eosinophils Absolute: 3.1 K/uL — ABNORMAL HIGH (ref 0.0–0.5)
Eosinophils Relative: 20 %
HCT: 32.9 % — ABNORMAL LOW (ref 39.0–52.0)
Hemoglobin: 10.9 g/dL — ABNORMAL LOW (ref 13.0–17.0)
Lymphocytes Relative: 13 %
Lymphs Abs: 2 K/uL (ref 0.7–4.0)
MCH: 27.7 pg (ref 26.0–34.0)
MCHC: 33.1 g/dL (ref 30.0–36.0)
MCV: 83.7 fL (ref 80.0–100.0)
Monocytes Absolute: 0.9 K/uL (ref 0.1–1.0)
Monocytes Relative: 6 %
Neutro Abs: 9.2 K/uL — ABNORMAL HIGH (ref 1.7–7.7)
Neutrophils Relative %: 60 %
Platelets: 430 K/uL — ABNORMAL HIGH (ref 150–400)
RBC: 3.93 MIL/uL — ABNORMAL LOW (ref 4.22–5.81)
RDW: 15 % (ref 11.5–15.5)
WBC: 15.3 K/uL — ABNORMAL HIGH (ref 4.0–10.5)
nRBC: 0 % (ref 0.0–0.2)

## 2024-09-03 LAB — CULTURE, BLOOD (ROUTINE X 2)
Culture: NO GROWTH
Culture: NO GROWTH

## 2024-09-03 LAB — COMPREHENSIVE METABOLIC PANEL WITH GFR
ALT: 18 U/L (ref 0–44)
AST: 16 U/L (ref 15–41)
Albumin: 2.3 g/dL — ABNORMAL LOW (ref 3.5–5.0)
Alkaline Phosphatase: 62 U/L (ref 38–126)
Anion gap: 10 (ref 5–15)
BUN: 51 mg/dL — ABNORMAL HIGH (ref 8–23)
CO2: 21 mmol/L — ABNORMAL LOW (ref 22–32)
Calcium: 8.2 mg/dL — ABNORMAL LOW (ref 8.9–10.3)
Chloride: 107 mmol/L (ref 98–111)
Creatinine, Ser: 3.63 mg/dL — ABNORMAL HIGH (ref 0.61–1.24)
GFR, Estimated: 17 mL/min — ABNORMAL LOW (ref >60)
Glucose, Bld: 117 mg/dL — ABNORMAL HIGH (ref 70–99)
Potassium: 3.6 mmol/L (ref 3.5–5.1)
Sodium: 138 mmol/L (ref 135–145)
Total Bilirubin: 0.8 mg/dL (ref 0.0–1.2)
Total Protein: 5.8 g/dL — ABNORMAL LOW (ref 6.5–8.1)

## 2024-09-03 LAB — GLUCOSE, CAPILLARY
Glucose-Capillary: 116 mg/dL — ABNORMAL HIGH (ref 70–99)
Glucose-Capillary: 152 mg/dL — ABNORMAL HIGH (ref 70–99)
Glucose-Capillary: 168 mg/dL — ABNORMAL HIGH (ref 70–99)

## 2024-09-03 LAB — PATHOLOGIST SMEAR REVIEW

## 2024-09-03 LAB — MAGNESIUM: Magnesium: 2.4 mg/dL (ref 1.7–2.4)

## 2024-09-03 LAB — PHOSPHORUS: Phosphorus: 3.4 mg/dL (ref 2.5–4.6)

## 2024-09-03 MED ORDER — AMLODIPINE BESYLATE 5 MG PO TABS
5.0000 mg | ORAL_TABLET | Freq: Every day | ORAL | 0 refills | Status: DC
  Filled 2024-09-03: qty 30, 30d supply, fill #0

## 2024-09-03 MED ORDER — AMLODIPINE BESYLATE 5 MG PO TABS
5.0000 mg | ORAL_TABLET | Freq: Every day | ORAL | Status: DC
  Administered 2024-09-03: 5 mg via ORAL
  Filled 2024-09-03: qty 1

## 2024-09-03 MED ORDER — LANTUS SOLOSTAR 100 UNIT/ML ~~LOC~~ SOPN: 15.0000 [IU] | PEN_INJECTOR | Freq: Every day | SUBCUTANEOUS | Status: DC

## 2024-09-03 MED ORDER — HUMALOG KWIKPEN 200 UNIT/ML ~~LOC~~ SOPN: 0.0000 [IU] | PEN_INJECTOR | Freq: Three times a day (TID) | SUBCUTANEOUS | Status: DC

## 2024-09-03 MED ORDER — AMOXICILLIN-POT CLAVULANATE 500-125 MG PO TABS
500.0000 mg | ORAL_TABLET | Freq: Two times a day (BID) | ORAL | 0 refills | Status: AC
  Filled 2024-09-03: qty 22, 11d supply, fill #0

## 2024-09-03 MED ORDER — AMOXICILLIN-POT CLAVULANATE 500-125 MG PO TABS
500.0000 mg | ORAL_TABLET | Freq: Two times a day (BID) | ORAL | 0 refills | Status: DC
  Filled 2024-09-03: qty 28, 14d supply, fill #0

## 2024-09-03 NOTE — TOC Transition Note (Signed)
 Transition of Care [facility]) - Discharge Note   Patient Details  Name: Brendan [name]. MRN: [id number] Date of Birth: [birth year]  Transition of [facility] CM/SW Contact:  Rosaline JONELLE Joe, RN Phone Number: 09/03/2024, 12:00 PM   Clinical Narrative:    CM met with the patient at the bedside and he states that he plans to return home with his wife today.  Patient is set up with [facility] for RN, PT and OT.  I asked that bedside nursing provide the patient with dressing supplies for home.  [employer] was called to deliver 3:1 and Rolator to the bedside today prior to discharge.  The patient's wife plans to provide transportation to home by car per patient.  Wife was not in the room at this time but patient is oriented and able to coordinate care discussion.         Patient Goals and CMS Choice            Discharge Placement                       Discharge Plan and Services Additional resources added to the After Visit Summary for                                       Social Drivers of Health (SDOH) Interventions SDOH Screenings   Food Insecurity: Patient Unable To Answer (08/31/2024)  Housing: Low Risk  (09/01/2024)  Transportation Needs: No Transportation Needs (08/31/2024)  Utilities: At Risk (08/31/2024)  Financial Resource Strain: Low Risk  (01/08/2023)  Social Connections: Socially Integrated (08/31/2024)  Tobacco Use: Low Risk  (08/30/2024)     Readmission Risk Interventions    09/01/2024   11:34 AM  Readmission Risk Prevention Plan  Transportation Screening Complete  Home Care Screening Complete  Medication Review (RN CM) Referral to Pharmacy

## 2024-09-03 NOTE — Progress Notes (Signed)
 VASCULAR LAB    Right lower extremity venous duplex has been performed.  See CV proc for preliminary results.   Zyon Rosser, RVT 09/03/2024, 4:44 PM

## 2024-09-03 NOTE — Progress Notes (Signed)
 Reviewed AVS, patient expressed understanding of medications, MD follow up reviewed.   Removed IV, Site clean, dry and intact.  See LDA for information on wounds at discharge. Patient states all belongings brought to the hospital at time of admission are accounted for and packed to take home.  Picked up medications from [facility] pharmacy. [facility] [employer] contacted to transport patient to Discharge lounge to wait for transportation home.

## 2024-09-03 NOTE — Plan of Care (Signed)
 Patient calm and cooperative A&O X4. One bowel movements this shift, dressing changed. Patient left with call bell in reach and side rails up .  Problem: Education: Goal: Knowledge of General Education information will improve Description: Including pain rating scale, medication(s)/side effects and non-pharmacologic comfort measures Outcome: Progressing   Problem: Health Behavior/Discharge Planning: Goal: Ability to manage health-related needs will improve Outcome: Progressing   Problem: Activity: Goal: Risk for activity intolerance will decrease Outcome: Progressing   Problem: Nutrition: Goal: Adequate nutrition will be maintained Outcome: Progressing   Problem: Coping: Goal: Level of anxiety will decrease Outcome: Progressing   Problem: Elimination: Goal: Will not experience complications related to bowel motility Outcome: Progressing   Problem: Pain Managment: Goal: General experience of comfort will improve and/or be controlled Outcome: Progressing   Problem: Skin Integrity: Goal: Risk for impaired skin integrity will decrease Outcome: Progressing   Problem: Education: Goal: Ability to describe self-care measures that may prevent or decrease complications (Diabetes Survival Skills Education) will improve Outcome: Progressing

## 2024-09-03 NOTE — Discharge Summary (Addendum)
**Note Brendan [name]**  Physician Discharge Summary  [name] FMW:[id number] DOB: Mar 22, 1956 DOA: 08/28/2024  PCP: Rexanne Ingle, MD  Admit date: 08/28/2024 Discharge date: 09/03/2024  Time spent: 40 minutes  Recommendations for Outpatient Follow-up:  Follow outpatient CBC/CMP  Follow with surgery outpatient  Creatinine worse than baseline - entresto , finerenone , torsmide on hold - follow with renal outpatient Follow blood sugars outpatient - adjustment to insulin  regimen as noted above Follow blood pressure outpatient - added amlodipine  with removal of meds above  Discharge Diagnoses:  Principal Problem:   Necrotizing fasciitis (HCC) Active Problems:   Sepsis (HCC)   Discharge Condition: stable  Diet recommendation: heart healthy, diabetic  Filed Weights   08/29/24 0500 08/30/24 1111 09/03/24 1300  Weight: 108.1 kg 108.1 kg 131 kg    History of present illness:   68 year old male with history of DM2, HTN, hypothyroidism, OSA, obesity, CKD 4, chronic diastolic CHF who came into the hospital with groin pain and swelling. He had Sameul Tagle boil behind his testicles which started draining on 10/9, and this was associated with scrotal and penile swelling. He was found to have necrotizing fasciitis, general surgery as well as urology was consulted and emergently was taken to the OR upon presentation on 10/10 and was monitored in the ICU afterwards. Upon stability he was transferred to the hospitalist service on 10/12.   He's now stable after I&D x2.  ID recommending augmentin  x2 weeks from last debridement (10/12).    He's currently stable for discharge.    See below for additional details.  Hospital Course:  Assessment and Plan:  Sepsis due to Necrotizing Fasciitis of Scrotum/Perineum On farxiga  prior to admission, this will be discontinued at discharge S/p surgery 10/10 and 10/12 for I&D 10/10 culture with strep constellatus, mssa, prevotella bivia, beta lactamase positive  Appreciate ID  assistance, recommending transition to augmentin  for 2 week course from last debridement (10/12-10/26) Continue dressing changes BID or when soiled - RN to teach wife wound care   T2DM Basal + SSI - adjust as needed Using less basal here than he uses at home, he and his wife note maybe 2 low BG's Jowan Skillin week at home Will discharge on 15 units basal and SSI  Resume mounjaro   Stop farxiga  in setting of perineal infection above   Hypertension Currently on hydralazine , coreg  Home finerenone  currently on hold Entresto  currently on hold  Torsemide  on hold   AKI on CKD IV Creatinine appears worse than recent baseline (03/2024 baseline creatinine appears to be around 3) Creatinine has trended around 3.5 - 3.8 since presentation (? Progression of CKD vs AKI) UA with 100 mg/dl protein, 0-5 RBC.  CT pelvis without abnormality visualized in urinary tract (in pelvis).  Holding finerenone , entresto , torsemide  for now - follow with outpatient nephrologist prior to resumption    HFpEF Follows with Dr. Bensimohn Entresto , torsemide , finerenone  on hold - follow up renal function outpatient prior to resumption  Coreg  Appears euvolemic    Hypothyroidism synthroid    OSA CPAP  Hx Prostate Cancer PSA <0.02 Follow with urology outpatient   Right Lower Extremity Edema LE US  prelim negative   Obesity Body mass index is 37.06 kg/m.    Procedures:   10/12 Procedure:  Debridement of perineal wound (final dimensions: 15cm 10cm x 3cm)   10/10  SHARP EXCISIONAL DEBRIDEMENT USING SCALPEL AND ELECTROCAUTERY TO REMOVE SKIN AND SUBCUTANEOUS TISSUE OF THE PERINEUM AND POSTERIOR SCROTUM  (12 CM ANTERIOR/POSTERIOR BY 4 CM MEDIAL/LATERAL BY 4CM DEEP TOTAL DEBRIDEMENT)  Consultations: Infectious  disease Surgery urology  Discharge Exam: Vitals:   09/03/24 0825 09/03/24 1243  BP: (!) 171/84 (!) 168/76  Pulse: 70 63  Resp: 18 18  Temp: 97.6 F (36.4 C) 98.1 F (36.7 C)  SpO2: 100% 100%   Eager  to discharge home Wife over phone  General: No acute distress. Cardiovascular: RRR Lungs: unlabored Abdomen: Soft, nontender, nondistended  Scrotal wound noted Neurological: Alert and oriented 3. Moves all extremities 4 with equal strength. Cranial nerves II through XII grossly intact. Extremities: mild right LE edema  Discharge Instructions   Discharge Instructions     Call MD for:  difficulty breathing, headache or visual disturbances   Complete by: As directed    Call MD for:  extreme fatigue   Complete by: As directed    Call MD for:  hives   Complete by: As directed    Call MD for:  persistant dizziness or light-headedness   Complete by: As directed    Call MD for:  persistant nausea and vomiting   Complete by: As directed    Call MD for:  redness, tenderness, or signs of infection (pain, swelling, redness, odor or green/yellow discharge around incision site)   Complete by: As directed    Call MD for:  severe uncontrolled pain   Complete by: As directed    Call MD for:  temperature >100.4   Complete by: As directed    Diet - low sodium heart healthy   Complete by: As directed    Discharge instructions   Complete by: As directed    You were seen for [name] scrotal and perineal infection.  You've improved after surgery and antibiotics.  We'll discharge you with another 2 weeks of antibiotics.    Continue dressing changes as recommended by general surgery.  You can shower without the dressing and then put Mima Cranmore clean one on, or shower with current dressing in place then immediately change (you can use Mana Haberl removable shower head to spray that region with the dressing off as well).  STOP your farxiga .  We'll discontinue this due to the risk of perineal infections with this medication.   We'll reduce your basal insulin  to 15 units.  Follow your blood sugar trends outpatient.  This will probably need to be gradually adjusted outpatient.  I've given you Kessler Solly new sliding scale, since you  don't have your old one.  Good blood sugar control will help with wound healing.  Follow closely with your PCP outpatient for adjustments to your insulin  regimen.  For your high blood pressure, we've added amlodipine  to your regimen.  Continue hydralazine  and coreg .    We've stopped your entresto , finerenone , and torsemide  due to your kidney function which is worse than your recent baseline.  Follow repeat labs with your PCP or kidney doctor outpatient to follow up your kidney function and determine whether you can resume your entresto , finerenone , and torsemide .  Follow with urology outpatient for your history of prostate cancer.  Return for new, recurrent, or worsening symptoms.  Please ask your PCP to request records from this hospitalization so they know what was done and what the next steps will be.   Discharge wound care:   Complete by: As directed    Perineal wound: Pack with Kerlix, cover with ABD. Change BID or when soiled   Increase activity slowly   Complete by: As directed       Allergies as of 09/03/2024       Reactions  Ace Inhibitors Cough   Maxidex [dexamethasone] Other (See Comments)   Sexual dysfunction   Verapamil Other (See Comments)   constipation        Medication List     PAUSE taking these medications    Kerendia  20 MG Tabs Wait to take this until your doctor or other care provider tells you to start again. Hold this medicine until you follow up with your PCP or kidney doctor for repeat labs and follow up instructions Generic drug: Finerenone  Take 20 mg by mouth daily.   potassium chloride  SA 20 MEQ tablet Wait to take this until your doctor or other care provider tells you to start again. Don't take this unless instructed by your PCP or nephrologist after repeat labs Commonly known as: KLOR-CON  M Take 3 tablets (60 mEq total) by mouth daily. What changed: how much to take   sacubitril -valsartan  49-51 MG Wait to take this until your doctor  or other care provider tells you to start again. Hold your entresto  until you follow up with your PCP or kidney doctor outpatient for repeat labs Commonly known as: ENTRESTO  Take 1 tablet by mouth 2 (two) times daily.   torsemide  20 MG tablet Wait to take this until your doctor or other care provider tells you to start again. Follow up with you PCP or kidney doctor for repeat labs before resuming this medicine Commonly known as: DEMADEX  Take 3 tablets (60 mg total) by mouth daily. What changed: how much to take       STOP taking these medications    Farxiga  10 MG Tabs tablet Generic drug: dapagliflozin  propanediol       TAKE these medications    acetaminophen  500 MG tablet Commonly known as: TYLENOL  Take 500 mg by mouth every 6 (six) hours as needed for mild pain.   amLODipine  5 MG tablet Commonly known as: NORVASC  Take 1 tablet (5 mg total) by mouth daily. Start taking on: September 04, 2024   amoxicillin -clavulanate 500-125 MG tablet Commonly known as: Augmentin  Take 1 tablet by mouth 2 (two) times daily for 14 days.   carvedilol  25 MG tablet Commonly known as: COREG  Take 1 tablet (25 mg total) by mouth 2 (two) times daily with food   clobetasol  ointment 0.05 % Commonly known as: TEMOVATE  Apply topically daily before sleeping.   dorzolamide 2 % ophthalmic solution Commonly known as: TRUSOPT Place 1 drop into both eyes daily.   FreeStyle Libre 3 Sensor Misc Apply to upper back of arm and change ever 14 days.   FreeStyle Libre 3 Plus Sensor Misc Apply to upper back of arm. Change every 15 days.   HumaLOG  KwikPen 200 UNIT/ML KwikPen Generic drug: insulin  lispro Inject 0-15 Units into the skin 3 (three) times daily with meals. CBG < 70: treat low blood sugar CBG 70 - 120: 0 units  CBG 121 - 150: 2 units  CBG 151 - 200: 3 units  CBG 201 - 250: 5 units  CBG 251 - 300: 8 units  CBG 301 - 350: 11 units  CBG 351 - 400: 15 units  CBG >400: Call MD What  changed:  how much to take when to take this additional instructions   hydrALAZINE  100 MG tablet Commonly known as: APRESOLINE  Take 1/2 tablet (50 mg total) by mouth 2 (two) times daily. What changed: when to take this   Insupen Pen Needles 32G X 4 MM Misc Generic drug: Insulin  Pen Needle Use to inject insulin  4 times daily  Insupen Pen Needles 32G X 4 MM Misc Generic drug: Insulin  Pen Needle Use to inject insulin  up to 4 times daily.   Lantus  SoloStar 100 UNIT/ML Solostar Pen Generic drug: insulin  glargine Inject 15 Units into the skin daily. Follow up with your PCP outpatient for further adjustment to your regimen What changed:  how much to take additional instructions   levothyroxine  125 MCG tablet Commonly known as: SYNTHROID  Take 1 tablet (125 mcg total) by mouth every morning on an empty stomach   Mounjaro  15 MG/0.5ML Pen Generic drug: tirzepatide  Inject 15 mg into the skin once Jeston Junkins week. What changed: Another medication with the same name was removed. Continue taking this medication, and follow the directions you see here.   simvastatin  20 MG tablet Commonly known as: ZOCOR  Take 1 tablet (20 mg total) by mouth every evening.   timolol  0.5 % ophthalmic solution Commonly known as: BETIMOL  Place 1 drop into both eyes daily.               Durable Medical Equipment  (From admission, onward)           Start     Ordered   09/02/24 1602  For home use only DME Bedside commode  Once       Comments: Patient needs bariatric 3:1 - patient unable to mobilize to bathroom facilities - weight 108 kd, height - 6'2  Question Answer Comment  Patient needs Jackqueline Aquilar bedside commode to treat with the following condition Necrotizing fasciitis [facility])   Patient needs D'Arcy Abraha bedside commode to treat with the following condition Sepsis [facility])   Patient needs Dmarion Perfect bedside commode to treat with the following condition Obesity      09/02/24 1602   09/02/24 1559  For home use only DME 4  wheeled rolling walker with seat  Once       Comments: Needs bariatric rollator - weight 108 kg, 6'2  Question Answer Comment  Patient needs Abi Shoults walker to treat with the following condition Necrotizing fasciitis (HCC)   Patient needs Kattaleya Alia walker to treat with the following condition Sepsis [facility])   Patient needs Kohner Orlick walker to treat with the following condition Obesity      09/02/24 1602              Discharge Care Instructions  (From admission, onward)           Start     Ordered   09/03/24 0000  Discharge wound care:       Comments: Perineal wound: Pack with Kerlix, cover with ABD. Change BID or when soiled   09/03/24 1553           Allergies  Allergen Reactions   Ace Inhibitors Cough   Maxidex [Dexamethasone] Other (See Comments)    Sexual dysfunction   Verapamil Other (See Comments)    constipation    Follow-up Information     Maczis, Puja Gosai, PA-C Follow up.   Specialty: General Surgery Why: our office is scheduling you for follow up in 2-3 weeks for wound check. call to confirm date/time. Contact information: [address] [place] [address] [phone]         [facility]  Follow up.   Why: [facility] will [facility] services.  They will call you in the next 24 hours and start services at the home for home health RN, PT, and OT. Contact information: [address] [place] [address]  [phone]         [facility] Follow up.   Why: [facility] will deliver Ambrie Carte Rolator and 3:1 to your bedside before you are discharged to home. Contact information: [address] [address] [phone]                  The results of significant diagnostics from this hospitalization (including imaging, microbiology, ancillary and laboratory) are listed below for reference.    Significant Diagnostic Studies: DG Abd Portable 1V Result Date:  09/01/2024 CLINICAL DATA:  86105 Emesis 86105 86085 Abdominal distention [id number] [id number] Constipation [id number] Vomiting this morning.  History of gastric band. EXAM: DG ABD PORTABLE 1V COMPARISON:  Radiographs 04/07/2018.  Abdominopelvic CT 07/09/2007. FINDINGS: 0904 hours. Single supine view of the abdomen demonstrates Kyi Romanello laparoscopic gastric band in grossly stable positioning. The tubing is suboptimally visualized on this portable examination. There is moderate distention of the stomach. No significant small or large bowel distention identified. No supine evidence of free intraperitoneal air. Multilevel lumbar spondylosis noted. IMPRESSION: 1. Nonspecific, moderate distention of the stomach. Correlate clinically to exclude gastric outlet obstruction. 2. No evidence of bowel obstruction or free intraperitoneal air. 3. Grossly stable positioning of the laparoscopic gastric band. Electronically Signed   By: Elsie Perone M.D.   On: 09/01/2024 14:58   CT PELVIS WO CONTRAST Result Date: 08/28/2024 CLINICAL DATA:  Soft tissue infection suspected, perenial abscess EXAM: CT PELVIS WITHOUT CONTRAST TECHNIQUE: Multidetector CT imaging of the pelvis was performed following the standard protocol without intravenous contrast. RADIATION DOSE REDUCTION: This exam was performed according to the departmental dose-optimization program which includes automated exposure control, adjustment of the mA and/or kV according to patient size and/or use of iterative reconstruction technique. COMPARISON:  None Available. FINDINGS: Urinary Tract:  No abnormality visualized. Bowel:  Unremarkable visualized pelvic bowel loops. Vascular/Lymphatic: No pathologically enlarged lymph nodes. No significant vascular abnormality seen. Small bilateral inguinal lymph nodes, none pathologically enlarged. Reproductive: Scrotal wall edema noted. There is gas within the scrotum and lower perineum concerning for infection with gas-forming organism/necrotizing  fasciitis. Other:  No free fluid.  Small umbilical hernia containing fat. Musculoskeletal: No acute bony abnormality. IMPRESSION: Edema/swelling throughout the scrotal wall. Gas noted in the scrotum and lower perineum concerning for necrotizing fasciitis. These results were called by telephone at the time of interpretation on 08/28/2024 at 6:03 pm to provider WHITNEY PLUNKETT , who verbally acknowledged these results. Electronically Signed   By: Franky Crease M.D.   On: 08/28/2024 18:04   MR LUMBAR SPINE WO CONTRAST Result Date: 08/22/2024  [facility] [address], [address] [place], [place] [address] [phone] NEUROIMAGING REPORT STUDY DATE: 08/21/2024 PATIENT NAME: Brendan [name]. DOB: Apr 22, 1956 MRN: [id number] EXAM: MRI of the lumbar spine without contrast ORDERING CLINICIAN: Eduard Hanlon MD CLINICAL HISTORY: 68 year old man with spinal stenosis with neurogenic claudication COMPARISON FILMS: 08/09/2020 TECHNIQUE: MRI of the lumbar spine was obtained utilizing 4 mm sagittal slices from T11-12 down to the lower sacrum with T1, T2 and inversion recovery views. In addition 4 mm axial slices from L1-2 down to L5-S1 level were included with T1 and T2 weighted views. CONTRAST: None IMAGING SITE: [facility], [address] [place], [place], [place] FINDINGS: On sagittal images, the spine is imaged from T11 to the sacrum.   The conus medullaris and cauda equine appear normal.   The vertebral bodies are normally aligned.   The vertebral bodies have normal signal.  The discs  and interspaces were further evaluated on axial views from L1 to S1 as follows: T12-L1: This level is unremarkable. L1-L2: This level is unremarkable. L2-L3: There is increased epidural fat, mild facet hypertrophy and minimal disc bulging combined to cause mild spinal stenosis and mild foraminal narrowing.  There is no nerve root compression. L3-L4: There is congenital narrowing of the spinal canal combined with disc bulging,  facet hypertrophy, ligamenta flava hypertrophy and increased epidural fat.  This causes moderately severe spinal stenosis (AP diameter 6.5 mm) and moderate right greater than left foraminal narrowing and moderately severe lateral recess stenosis, left greater than right.  There is potential for L4 nerve root compression at this level to either side. L4-L5: There is congenital narrowing of the spinal canal combined with broad disc protrusion, facet hypertrophy with joint effusion, ligamenta flava hypertrophy and increased epidural fat.  This leads to severe spinal stenosis (AP diameter 3.8 mm), severe left and mild to moderate right foraminal narrowing, severe left and moderate right lateral recess stenosis.  There is potential for left L4 and L5 nerve root compression. L5-S1: There is disc bulging and facet hypertrophy causing mild foraminal and mild to moderate lateral recess stenosis.  There is no spinal stenosis or nerve root compression. Compared to the MRI from 08/09/2020, there is progression of the degenerative changes in the lower lumbar spine.   This MRI of the lumbar spine without contrast shows the following: At L2-L3, there is mild spinal stenosis but no nerve root compression.  At L3-L4, there is moderately severe spinal stenosis (AP diameter 6.5 mm) and moderately severe bilateral lateral recess stenosis with some potential for L4 nerve root compression to either side.  Degenerative changes at this level have progressed compared to the 2021 MRI. At L4-L5, there is severe spinal stenosis (AP diameter 3.8 mm) severe left foraminal narrowing and severe left lateral recess stenosis.  There is potential for left L4 and L5 nerve root compression.  Degenerative changes at this level have progressed compared to the 2021 MRI. At L5-S1, there are degenerative changes but no spinal stenosis or nerve root compression. INTERPRETING PHYSICIAN: Richard Deserae Jennings. Vear, MD, PhD, FAAN Certified in  Neuroimaging by [employer] 08/21/2024    Microbiology: Recent Results (from the past 240 hours)  Culture, blood (Routine x 2)     Status: None   Collection Time: 08/28/24  4:15 PM   Specimen: BLOOD LEFT FOREARM  Result Value Ref Range Status   Specimen Description   Final    BLOOD LEFT FOREARM Performed at [facility], [address], [place], [place] [address]    Special Requests   Final    BOTTLES DRAWN AEROBIC AND ANAEROBIC Blood Culture adequate volume Performed at [facility], [address] [place], [place] [address]    Culture   Final    NO GROWTH 5 DAYS Performed at [facility], [address] [place], [place] [address]    Report Status 09/02/2024 FINAL  Final  Culture, blood (Routine x 2)     Status: None   Collection Time: 08/28/24  4:16 PM   Specimen: BLOOD RIGHT FOREARM  Result Value Ref Range Status   Specimen Description   Final    BLOOD RIGHT FOREARM Performed at [facility], [address], [place], [place] [address]    Special Requests   Final    BOTTLES DRAWN AEROBIC AND ANAEROBIC Blood Culture adequate volume Performed at [facility], [address] [place], [place] [address]    Culture   Final    NO GROWTH 5 DAYS Performed at [facility], [address] [place], [place] [address]    Report Status 09/02/2024 FINAL  Final  Aerobic/Anaerobic Culture w Gram Stain (surgical/deep wound)     Status: None   Collection Time: 08/28/24 10:16 PM   Specimen: Path Tissue  Result Value Ref Range Status   Specimen Description TISSUE  Final   Special Requests PERINEUM TISSUE CULTURE SPEC Analynn Daum  Final   Gram Stain   Final    FEW WBC PRESENT, PREDOMINANTLY PMN MODERATE GRAM POSITIVE COCCI RARE GRAM NEGATIVE RODS    Culture   Final    ABUNDANT STREPTOCOCCUS CONSTELLATUS RARE STAPHYLOCOCCUS AUREUS FEW PREVOTELLA BIVIA BETA LACTAMASE POSITIVE Performed at [facility], [address]  [place], [place] [address]    Report Status 09/02/2024 FINAL  Final   Organism ID, Bacteria STAPHYLOCOCCUS AUREUS  Final   Organism ID, Bacteria STREPTOCOCCUS CONSTELLATUS  Final      Susceptibility   Streptococcus constellatus - MIC*    PENICILLIN <=0.06 SENSITIVE Sensitive     CEFTRIAXONE 0.25 SENSITIVE Sensitive     LEVOFLOXACIN 0.5 SENSITIVE Sensitive     VANCOMYCIN 0.5 SENSITIVE Sensitive     * ABUNDANT STREPTOCOCCUS CONSTELLATUS   Staphylococcus aureus - MIC*    CIPROFLOXACIN <=0.5 SENSITIVE Sensitive     ERYTHROMYCIN <=0.25 SENSITIVE Sensitive     GENTAMICIN <=0.5 SENSITIVE Sensitive     OXACILLIN 0.5 SENSITIVE Sensitive     TETRACYCLINE <=1 SENSITIVE Sensitive     VANCOMYCIN 1 SENSITIVE Sensitive     TRIMETH/SULFA <=10 SENSITIVE Sensitive     CLINDAMYCIN <=0.25 SENSITIVE Sensitive     RIFAMPIN <=0.5 SENSITIVE Sensitive     Inducible Clindamycin NEGATIVE Sensitive     LINEZOLID 2 SENSITIVE Sensitive     * RARE STAPHYLOCOCCUS AUREUS  MRSA Next Gen by PCR, Nasal     Status: None   Collection Time: 08/28/24 11:42 PM   Specimen: Nasal Mucosa; Nasal Swab  Result Value Ref Range Status   MRSA by PCR Next Gen NOT DETECTED NOT DETECTED Final    Comment: (NOTE) The GeneXpert MRSA Assay (FDA approved for NASAL specimens only), is one component of Kyran Connaughton comprehensive MRSA colonization surveillance program. It is not intended to diagnose MRSA infection nor to guide or monitor treatment for MRSA infections. Test performance is not FDA approved in patients less than 13 years old. Performed at [facility], [address], [place], [place] [address]   Culture, blood (Routine X 2) w Reflex to ID Panel     Status: None   Collection Time: 08/29/24  3:29 AM   Specimen: BLOOD  Result Value Ref Range Status   Specimen Description BLOOD SITE NOT SPECIFIED  Final   Special Requests   Final    BOTTLES DRAWN AEROBIC AND ANAEROBIC Blood Culture results may not be optimal due to an inadequate  volume of blood received in culture bottles   Culture   Final    NO GROWTH 5 DAYS Performed at [facility], [address] [place], [place] [address]    Report Status 09/03/2024 FINAL  Final  Culture, blood (Routine X 2) w Reflex to ID Panel     Status: None   Collection Time: 08/29/24  3:31 AM   Specimen: BLOOD  Result Value Ref Range Status   Specimen Description BLOOD  SITE NOT SPECIFIED  Final   Special Requests   Final    BOTTLES DRAWN AEROBIC AND ANAEROBIC Blood Culture results may not be optimal due to an inadequate volume of blood received in culture bottles   Culture   Final    NO GROWTH 5 DAYS Performed at [facility], [address], [place], [place] [address]    Report Status 09/03/2024 FINAL  Final     Labs: Basic Metabolic Panel: Recent Labs  Lab 08/29/24 0326 08/31/24 0426 09/01/24 0342 09/02/24 0148 09/03/24 0459  NA 135 134* 139 138 138  K 4.6 4.5 3.8 3.8 3.6  CL 100 103 106 107 107  CO2 21* 17* 21* 20* 21*  GLUCOSE 195* 253* 97 244* 117*  BUN 41* 53* 55* 53* 51*  CREATININE 3.55* 3.52* 3.81* 3.55* 3.63*  CALCIUM 8.3* 8.1* 8.1* 7.9* 8.2*  MG 1.6* 2.5*  --  2.5* 2.4  PHOS  --  4.6  --  3.4 3.4   Liver Function Tests: Recent Labs  Lab 08/28/24 1613 08/31/24 0426 09/02/24 0148 09/03/24 0459  AST 16 14* 18 16  ALT 17 14 18 18   ALKPHOS 98 71 67 62  BILITOT 0.5 0.7 0.8 0.8  PROT 7.2 5.9* 5.7* 5.8*  ALBUMIN 3.7 2.4* 2.2* 2.3*   No results for input(s): LIPASE, AMYLASE in the last 168 hours. No results for input(s): AMMONIA in the last 168 hours. CBC: Recent Labs  Lab 08/28/24 1613 08/29/24 0329 08/31/24 0426 09/01/24 0342 09/02/24 0148 09/03/24 0459  WBC 22.2* 26.3* 22.8* 13.5* 11.0* 15.3*  NEUTROABS 18.0*  --   --   --   --  9.2*  HGB 11.7* 11.0* 10.7* 10.7* 10.5* 10.9*  HCT 34.4* 32.3* 31.9* 32.1* 31.7* 32.9*  MCV 83.7 84.6 84.4 82.9 83.9 83.7  PLT 347 327 308 400 411* 430*   Cardiac Enzymes: No results for  input(s): CKTOTAL, CKMB, CKMBINDEX, TROPONINI in the last 168 hours. BNP: BNP (last 3 results) Recent Labs    01/20/24 1042  BNP 295.6*    ProBNP (last 3 results) Recent Labs    08/28/24 1613  PROBNP 6,038.0*    CBG: Recent Labs  Lab 09/02/24 1618 09/02/24 2020 09/03/24 0248 09/03/24 0838 09/03/24 1242  GLUCAP 317* 216* 116* 152* 168*       Signed:  Meliton Monte MD.  [facility] 09/03/2024, 3:55 PM

## 2024-09-03 NOTE — Evaluation (Signed)
 Occupational Therapy Evaluation Patient Details Name: Brendan [name]. MRN: [id number] DOB: 30-Oct-1956 Today's Date: 09/03/2024   History of Present Illness   Pt is a 68 y.o. male who presented 08/28/24 with perineal infection. He was found to have necrotizing fasciitis. S/p surgical debridement 10/10 & 10/12. PMH: DM2, HTN, hypothyroidism, OSA, obesity, CKD 4, chronic diastolic CHF, glaucoma, gynecomastia, HLD, vertigo, obesity, peripheral neuropathy, prostate cancer, sickle cell trait     Clinical Impressions Pt sitting EOB upon entry, c/o no pain or discomfort. Pt lives with wife, 4 STE, PLOF mod I with cane. Pt currently close to baseline, overall set up/supervision for ADLs, will need min A for LB bathing and perineal hygiene, ambulated 100 feet with RW with supervision, with some decreased activity tolerance for OOB activities. Pt would benefit from RW and [facility] for return home, HHOT follow up recommended, no further acute OT needs, mobility to follow for activity tolerance.      If plan is discharge home, recommend the following:   A little help with walking and/or transfers;A little help with bathing/dressing/bathroom;Assistance with cooking/housework;Assist for transportation;Help with stairs or ramp for entrance     Functional Status Assessment   Patient has had a recent decline in their functional status and demonstrates the ability to make significant improvements in function in a reasonable and predictable amount of time.     Equipment Recommendations   BSC/3in1;Other (comment) (RW)     Recommendations for Other Services         Precautions/Restrictions   Precautions Precautions: Fall Recall of Precautions/Restrictions: Intact Restrictions Weight Bearing Restrictions Per Provider Order: No     Mobility Bed Mobility               General bed mobility comments: sitting EOB and recliner, reports sleeps in flat bed at home    Transfers Overall  transfer level: Needs assistance Equipment used: Rolling walker (2 wheels) Transfers: Sit to/from Stand, Bed to chair/wheelchair/BSC Sit to Stand: Supervision     Step pivot transfers: Supervision     General transfer comment: supervision for safety      Balance Overall balance assessment: Needs assistance Sitting-balance support: No upper extremity supported, Feet supported Sitting balance-Leahy Scale: Good     Standing balance support: Single extremity supported, During functional activity Standing balance-Leahy Scale: Fair Standing balance comment: able to stand with one hand supported                           ADL either performed or assessed with clinical judgement   ADL Overall ADL's : At baseline;Needs assistance/impaired Eating/Feeding: Independent   Grooming: Supervision/safety;Standing   Upper Body Bathing: Supervision/ safety   Lower Body Bathing: Minimal assistance;Sitting/lateral leans;Sit to/from stand   Upper Body Dressing : Set up;Supervision/safety   Lower Body Dressing: Set up;Supervision/safety;Sitting/lateral leans;Sit to/from stand   Toilet Transfer: Supervision/safety;Rolling walker (2 wheels)   Toileting- Clothing Manipulation and Hygiene: Supervision/safety;Minimal assistance;Sitting/lateral lean       Functional mobility during ADLs: Supervision/safety;Rolling walker (2 wheels) General ADL Comments: set up/supervision for safety for most ADLs, may need min A for LB bathing and perineal hygiene to ensure proper hygiene, overall doing well.     Vision Baseline Vision/History: 0 No visual deficits Ability to See in Adequate Light: 0 Adequate Patient Visual Report: No change from baseline       Perception         Praxis  Pertinent Vitals/Pain Pain Assessment Pain Assessment: No/denies pain     Extremity/Trunk Assessment Upper Extremity Assessment Upper Extremity Assessment: Overall WFL for tasks assessed            Communication Communication Communication: No apparent difficulties   Cognition Arousal: Alert Behavior During Therapy: WFL for tasks assessed/performed Cognition: No apparent impairments                               Following commands: Intact       Cueing  General Comments   Cueing Techniques: Verbal cues      Exercises     Shoulder Instructions      Home Living Family/patient expects to be discharged to:: Private residence Living Arrangements: Spouse/significant other Available Help at Discharge: Family;Available 24 hours/day Type of Home: House Home Access: Stairs to enter [employer] of Steps: 4 Entrance Stairs-Rails: Can reach both Home Layout: One level     Bathroom Shower/Tub: Walk-in shower         Home Equipment: Cane - single point;Hand held shower head   Additional Comments: Pt lives with wife who works during the day, Pt reports he has friends who can stop by to assist at times      Prior Functioning/Environment Prior Level of Function : Independent/Modified Independent;Driving             Mobility Comments: Uses SPC and holds onto furniture to mobilize; x2 falls in past 6 months when not using the cane 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    OT Problem List: Decreased strength;Decreased range of motion;Decreased activity tolerance;Impaired balance (sitting and/or standing);Obesity;Increased edema   OT Treatment/Interventions:        OT Goals(Current goals can be found in the care plan section)   Acute Rehab OT Goals Patient Stated Goal: to return home OT Goal Formulation: With patient Time For Goal Achievement: 09/17/24 Potential to Achieve Goals: Good   OT Frequency:       Co-evaluation              AM-PAC OT 6 Clicks Daily Activity     Outcome Measure Help from another person eating meals?: None Help from another person taking care of personal grooming?: A Little Help from another  person toileting, which includes using toliet, bedpan, or urinal?: A Little Help from another person bathing (including washing, rinsing, drying)?: A Little Help from another person to put on and taking off regular upper body clothing?: A Little Help from another person to put on and taking off regular lower body clothing?: A Little 6 Click Score: 19   End of Session Equipment Utilized During Treatment: Gait belt;Rolling walker (2 wheels) Nurse Communication: Mobility status  Activity Tolerance: Patient tolerated treatment well Patient left: in chair;with call bell/phone within reach  OT Visit Diagnosis: Unsteadiness on feet (R26.81);Other abnormalities of gait and mobility (R26.89);Muscle weakness (generalized) (M62.81);Other (comment) (decreased activity tolerance)                Time: 8990-8961 OT Time Calculation (min): 29 min Charges:  OT General Charges $OT Visit: 1 Visit OT Evaluation $OT Eval Low Complexity: 1 Low OT Treatments $Self Care/Home Management : 8-22 mins  [place], OTR/L   Elouise JONELLE Bott 09/03/2024, 10:53 AM

## 2024-09-04 DIAGNOSIS — E1142 Type 2 diabetes mellitus with diabetic polyneuropathy: Secondary | ICD-10-CM | POA: Diagnosis not present

## 2024-09-04 DIAGNOSIS — K760 Fatty (change of) liver, not elsewhere classified: Secondary | ICD-10-CM | POA: Diagnosis not present

## 2024-09-04 DIAGNOSIS — Z48817 Encounter for surgical aftercare following surgery on the skin and subcutaneous tissue: Secondary | ICD-10-CM | POA: Diagnosis not present

## 2024-09-04 DIAGNOSIS — Z7985 Long-term (current) use of injectable non-insulin antidiabetic drugs: Secondary | ICD-10-CM | POA: Diagnosis not present

## 2024-09-04 DIAGNOSIS — G4733 Obstructive sleep apnea (adult) (pediatric): Secondary | ICD-10-CM | POA: Diagnosis not present

## 2024-09-04 DIAGNOSIS — I130 Hypertensive heart and chronic kidney disease with heart failure and stage 1 through stage 4 chronic kidney disease, or unspecified chronic kidney disease: Secondary | ICD-10-CM | POA: Diagnosis not present

## 2024-09-04 DIAGNOSIS — E039 Hypothyroidism, unspecified: Secondary | ICD-10-CM | POA: Diagnosis not present

## 2024-09-04 DIAGNOSIS — N184 Chronic kidney disease, stage 4 (severe): Secondary | ICD-10-CM | POA: Diagnosis not present

## 2024-09-04 DIAGNOSIS — I872 Venous insufficiency (chronic) (peripheral): Secondary | ICD-10-CM | POA: Diagnosis not present

## 2024-09-04 DIAGNOSIS — H409 Unspecified glaucoma: Secondary | ICD-10-CM | POA: Diagnosis not present

## 2024-09-04 DIAGNOSIS — Z6836 Body mass index (BMI) 36.0-36.9, adult: Secondary | ICD-10-CM | POA: Diagnosis not present

## 2024-09-04 DIAGNOSIS — E1122 Type 2 diabetes mellitus with diabetic chronic kidney disease: Secondary | ICD-10-CM | POA: Diagnosis not present

## 2024-09-04 DIAGNOSIS — Z8546 Personal history of malignant neoplasm of prostate: Secondary | ICD-10-CM | POA: Diagnosis not present

## 2024-09-04 DIAGNOSIS — Z556 Problems related to health literacy: Secondary | ICD-10-CM | POA: Diagnosis not present

## 2024-09-04 DIAGNOSIS — Z794 Long term (current) use of insulin: Secondary | ICD-10-CM | POA: Diagnosis not present

## 2024-09-04 DIAGNOSIS — M726 Necrotizing fasciitis: Secondary | ICD-10-CM | POA: Diagnosis not present

## 2024-09-04 DIAGNOSIS — M109 Gout, unspecified: Secondary | ICD-10-CM | POA: Diagnosis not present

## 2024-09-04 DIAGNOSIS — I5042 Chronic combined systolic (congestive) and diastolic (congestive) heart failure: Secondary | ICD-10-CM | POA: Diagnosis not present

## 2024-09-04 DIAGNOSIS — N179 Acute kidney failure, unspecified: Secondary | ICD-10-CM | POA: Diagnosis not present

## 2024-09-07 DIAGNOSIS — L400 Psoriasis vulgaris: Secondary | ICD-10-CM | POA: Diagnosis not present

## 2024-09-07 DIAGNOSIS — N184 Chronic kidney disease, stage 4 (severe): Secondary | ICD-10-CM | POA: Diagnosis not present

## 2024-09-07 DIAGNOSIS — Z0001 Encounter for general adult medical examination with abnormal findings: Secondary | ICD-10-CM | POA: Diagnosis not present

## 2024-09-07 DIAGNOSIS — E039 Hypothyroidism, unspecified: Secondary | ICD-10-CM | POA: Diagnosis not present

## 2024-09-07 DIAGNOSIS — I10 Essential (primary) hypertension: Secondary | ICD-10-CM | POA: Diagnosis not present

## 2024-09-07 DIAGNOSIS — R197 Diarrhea, unspecified: Secondary | ICD-10-CM | POA: Diagnosis not present

## 2024-09-07 DIAGNOSIS — M726 Necrotizing fasciitis: Secondary | ICD-10-CM | POA: Diagnosis not present

## 2024-09-08 DIAGNOSIS — E1142 Type 2 diabetes mellitus with diabetic polyneuropathy: Secondary | ICD-10-CM | POA: Diagnosis not present

## 2024-09-08 DIAGNOSIS — I130 Hypertensive heart and chronic kidney disease with heart failure and stage 1 through stage 4 chronic kidney disease, or unspecified chronic kidney disease: Secondary | ICD-10-CM | POA: Diagnosis not present

## 2024-09-08 DIAGNOSIS — N179 Acute kidney failure, unspecified: Secondary | ICD-10-CM | POA: Diagnosis not present

## 2024-09-08 DIAGNOSIS — Z8546 Personal history of malignant neoplasm of prostate: Secondary | ICD-10-CM | POA: Diagnosis not present

## 2024-09-08 DIAGNOSIS — Z556 Problems related to health literacy: Secondary | ICD-10-CM | POA: Diagnosis not present

## 2024-09-08 DIAGNOSIS — Z48817 Encounter for surgical aftercare following surgery on the skin and subcutaneous tissue: Secondary | ICD-10-CM | POA: Diagnosis not present

## 2024-09-08 DIAGNOSIS — H409 Unspecified glaucoma: Secondary | ICD-10-CM | POA: Diagnosis not present

## 2024-09-08 DIAGNOSIS — E039 Hypothyroidism, unspecified: Secondary | ICD-10-CM | POA: Diagnosis not present

## 2024-09-08 DIAGNOSIS — I5042 Chronic combined systolic (congestive) and diastolic (congestive) heart failure: Secondary | ICD-10-CM | POA: Diagnosis not present

## 2024-09-08 DIAGNOSIS — E1122 Type 2 diabetes mellitus with diabetic chronic kidney disease: Secondary | ICD-10-CM | POA: Diagnosis not present

## 2024-09-08 DIAGNOSIS — K760 Fatty (change of) liver, not elsewhere classified: Secondary | ICD-10-CM | POA: Diagnosis not present

## 2024-09-08 DIAGNOSIS — Z6836 Body mass index (BMI) 36.0-36.9, adult: Secondary | ICD-10-CM | POA: Diagnosis not present

## 2024-09-08 DIAGNOSIS — N184 Chronic kidney disease, stage 4 (severe): Secondary | ICD-10-CM | POA: Diagnosis not present

## 2024-09-08 DIAGNOSIS — M109 Gout, unspecified: Secondary | ICD-10-CM | POA: Diagnosis not present

## 2024-09-08 DIAGNOSIS — I872 Venous insufficiency (chronic) (peripheral): Secondary | ICD-10-CM | POA: Diagnosis not present

## 2024-09-08 DIAGNOSIS — Z7985 Long-term (current) use of injectable non-insulin antidiabetic drugs: Secondary | ICD-10-CM | POA: Diagnosis not present

## 2024-09-08 DIAGNOSIS — G4733 Obstructive sleep apnea (adult) (pediatric): Secondary | ICD-10-CM | POA: Diagnosis not present

## 2024-09-08 DIAGNOSIS — Z794 Long term (current) use of insulin: Secondary | ICD-10-CM | POA: Diagnosis not present

## 2024-09-10 DIAGNOSIS — H409 Unspecified glaucoma: Secondary | ICD-10-CM | POA: Diagnosis not present

## 2024-09-10 DIAGNOSIS — Z8546 Personal history of malignant neoplasm of prostate: Secondary | ICD-10-CM | POA: Diagnosis not present

## 2024-09-10 DIAGNOSIS — G4733 Obstructive sleep apnea (adult) (pediatric): Secondary | ICD-10-CM | POA: Diagnosis not present

## 2024-09-10 DIAGNOSIS — I872 Venous insufficiency (chronic) (peripheral): Secondary | ICD-10-CM | POA: Diagnosis not present

## 2024-09-10 DIAGNOSIS — K760 Fatty (change of) liver, not elsewhere classified: Secondary | ICD-10-CM | POA: Diagnosis not present

## 2024-09-10 DIAGNOSIS — Z794 Long term (current) use of insulin: Secondary | ICD-10-CM | POA: Diagnosis not present

## 2024-09-10 DIAGNOSIS — Z556 Problems related to health literacy: Secondary | ICD-10-CM | POA: Diagnosis not present

## 2024-09-10 DIAGNOSIS — E1142 Type 2 diabetes mellitus with diabetic polyneuropathy: Secondary | ICD-10-CM | POA: Diagnosis not present

## 2024-09-10 DIAGNOSIS — I5042 Chronic combined systolic (congestive) and diastolic (congestive) heart failure: Secondary | ICD-10-CM | POA: Diagnosis not present

## 2024-09-10 DIAGNOSIS — E039 Hypothyroidism, unspecified: Secondary | ICD-10-CM | POA: Diagnosis not present

## 2024-09-10 DIAGNOSIS — E1122 Type 2 diabetes mellitus with diabetic chronic kidney disease: Secondary | ICD-10-CM | POA: Diagnosis not present

## 2024-09-10 DIAGNOSIS — N184 Chronic kidney disease, stage 4 (severe): Secondary | ICD-10-CM | POA: Diagnosis not present

## 2024-09-10 DIAGNOSIS — M109 Gout, unspecified: Secondary | ICD-10-CM | POA: Diagnosis not present

## 2024-09-10 DIAGNOSIS — N179 Acute kidney failure, unspecified: Secondary | ICD-10-CM | POA: Diagnosis not present

## 2024-09-10 DIAGNOSIS — Z6836 Body mass index (BMI) 36.0-36.9, adult: Secondary | ICD-10-CM | POA: Diagnosis not present

## 2024-09-10 DIAGNOSIS — I130 Hypertensive heart and chronic kidney disease with heart failure and stage 1 through stage 4 chronic kidney disease, or unspecified chronic kidney disease: Secondary | ICD-10-CM | POA: Diagnosis not present

## 2024-09-10 DIAGNOSIS — Z48817 Encounter for surgical aftercare following surgery on the skin and subcutaneous tissue: Secondary | ICD-10-CM | POA: Diagnosis not present

## 2024-09-10 DIAGNOSIS — Z7985 Long-term (current) use of injectable non-insulin antidiabetic drugs: Secondary | ICD-10-CM | POA: Diagnosis not present

## 2024-09-10 LAB — LAB REPORT - SCANNED: EGFR: 15

## 2024-09-11 ENCOUNTER — Other Ambulatory Visit: Payer: Self-pay

## 2024-09-14 DIAGNOSIS — M109 Gout, unspecified: Secondary | ICD-10-CM | POA: Diagnosis not present

## 2024-09-14 DIAGNOSIS — I872 Venous insufficiency (chronic) (peripheral): Secondary | ICD-10-CM | POA: Diagnosis not present

## 2024-09-14 DIAGNOSIS — Z48817 Encounter for surgical aftercare following surgery on the skin and subcutaneous tissue: Secondary | ICD-10-CM | POA: Diagnosis not present

## 2024-09-14 DIAGNOSIS — I5042 Chronic combined systolic (congestive) and diastolic (congestive) heart failure: Secondary | ICD-10-CM | POA: Diagnosis not present

## 2024-09-14 DIAGNOSIS — Z794 Long term (current) use of insulin: Secondary | ICD-10-CM | POA: Diagnosis not present

## 2024-09-14 DIAGNOSIS — I130 Hypertensive heart and chronic kidney disease with heart failure and stage 1 through stage 4 chronic kidney disease, or unspecified chronic kidney disease: Secondary | ICD-10-CM | POA: Diagnosis not present

## 2024-09-14 DIAGNOSIS — G4733 Obstructive sleep apnea (adult) (pediatric): Secondary | ICD-10-CM | POA: Diagnosis not present

## 2024-09-14 DIAGNOSIS — Z556 Problems related to health literacy: Secondary | ICD-10-CM | POA: Diagnosis not present

## 2024-09-14 DIAGNOSIS — N179 Acute kidney failure, unspecified: Secondary | ICD-10-CM | POA: Diagnosis not present

## 2024-09-14 DIAGNOSIS — Z8546 Personal history of malignant neoplasm of prostate: Secondary | ICD-10-CM | POA: Diagnosis not present

## 2024-09-14 DIAGNOSIS — Z6836 Body mass index (BMI) 36.0-36.9, adult: Secondary | ICD-10-CM | POA: Diagnosis not present

## 2024-09-14 DIAGNOSIS — N184 Chronic kidney disease, stage 4 (severe): Secondary | ICD-10-CM | POA: Diagnosis not present

## 2024-09-14 DIAGNOSIS — E1122 Type 2 diabetes mellitus with diabetic chronic kidney disease: Secondary | ICD-10-CM | POA: Diagnosis not present

## 2024-09-14 DIAGNOSIS — E1142 Type 2 diabetes mellitus with diabetic polyneuropathy: Secondary | ICD-10-CM | POA: Diagnosis not present

## 2024-09-14 DIAGNOSIS — H409 Unspecified glaucoma: Secondary | ICD-10-CM | POA: Diagnosis not present

## 2024-09-14 DIAGNOSIS — K760 Fatty (change of) liver, not elsewhere classified: Secondary | ICD-10-CM | POA: Diagnosis not present

## 2024-09-14 DIAGNOSIS — E039 Hypothyroidism, unspecified: Secondary | ICD-10-CM | POA: Diagnosis not present

## 2024-09-14 DIAGNOSIS — Z7985 Long-term (current) use of injectable non-insulin antidiabetic drugs: Secondary | ICD-10-CM | POA: Diagnosis not present

## 2024-09-15 ENCOUNTER — Other Ambulatory Visit: Payer: Self-pay

## 2024-09-15 ENCOUNTER — Inpatient Hospital Stay (HOSPITAL_COMMUNITY)
Admission: EM | Admit: 2024-09-15 | Discharge: 2024-09-22 | DRG: 291 | Disposition: A | Discharge status: Home / Self Care | Attending: Family Medicine | Admitting: Family Medicine

## 2024-09-15 ENCOUNTER — Emergency Department (HOSPITAL_COMMUNITY)

## 2024-09-15 DIAGNOSIS — R0602 Shortness of breath: Secondary | ICD-10-CM | POA: Diagnosis not present

## 2024-09-15 DIAGNOSIS — E8721 Acute metabolic acidosis: Secondary | ICD-10-CM | POA: Diagnosis not present

## 2024-09-15 DIAGNOSIS — E1122 Type 2 diabetes mellitus with diabetic chronic kidney disease: Secondary | ICD-10-CM | POA: Diagnosis not present

## 2024-09-15 DIAGNOSIS — Z992 Dependence on renal dialysis: Secondary | ICD-10-CM | POA: Diagnosis not present

## 2024-09-15 DIAGNOSIS — D649 Anemia, unspecified: Secondary | ICD-10-CM | POA: Diagnosis not present

## 2024-09-15 DIAGNOSIS — Z8619 Personal history of other infectious and parasitic diseases: Secondary | ICD-10-CM

## 2024-09-15 DIAGNOSIS — I152 Hypertension secondary to endocrine disorders: Secondary | ICD-10-CM | POA: Diagnosis not present

## 2024-09-15 DIAGNOSIS — J9601 Acute respiratory failure with hypoxia: Secondary | ICD-10-CM | POA: Diagnosis present

## 2024-09-15 DIAGNOSIS — Z8249 Family history of ischemic heart disease and other diseases of the circulatory system: Secondary | ICD-10-CM

## 2024-09-15 DIAGNOSIS — N2889 Other specified disorders of kidney and ureter: Secondary | ICD-10-CM | POA: Diagnosis present

## 2024-09-15 DIAGNOSIS — R55 Syncope and collapse: Secondary | ICD-10-CM | POA: Diagnosis not present

## 2024-09-15 DIAGNOSIS — G4733 Obstructive sleep apnea (adult) (pediatric): Secondary | ICD-10-CM | POA: Diagnosis present

## 2024-09-15 DIAGNOSIS — I110 Hypertensive heart disease with heart failure: Secondary | ICD-10-CM | POA: Diagnosis not present

## 2024-09-15 DIAGNOSIS — E119 Type 2 diabetes mellitus without complications: Secondary | ICD-10-CM

## 2024-09-15 DIAGNOSIS — E269 Hyperaldosteronism, unspecified: Secondary | ICD-10-CM | POA: Diagnosis present

## 2024-09-15 DIAGNOSIS — N179 Acute kidney failure, unspecified: Secondary | ICD-10-CM | POA: Diagnosis present

## 2024-09-15 DIAGNOSIS — I509 Heart failure, unspecified: Secondary | ICD-10-CM | POA: Diagnosis not present

## 2024-09-15 DIAGNOSIS — N529 Male erectile dysfunction, unspecified: Secondary | ICD-10-CM | POA: Diagnosis present

## 2024-09-15 DIAGNOSIS — D631 Anemia in chronic kidney disease: Secondary | ICD-10-CM | POA: Diagnosis present

## 2024-09-15 DIAGNOSIS — E039 Hypothyroidism, unspecified: Secondary | ICD-10-CM | POA: Diagnosis present

## 2024-09-15 DIAGNOSIS — N184 Chronic kidney disease, stage 4 (severe): Secondary | ICD-10-CM | POA: Diagnosis not present

## 2024-09-15 DIAGNOSIS — E66812 Obesity, class 2: Secondary | ICD-10-CM | POA: Diagnosis present

## 2024-09-15 DIAGNOSIS — Z6836 Body mass index (BMI) 36.0-36.9, adult: Secondary | ICD-10-CM

## 2024-09-15 DIAGNOSIS — J9811 Atelectasis: Secondary | ICD-10-CM | POA: Diagnosis present

## 2024-09-15 DIAGNOSIS — Z79890 Hormone replacement therapy (postmenopausal): Secondary | ICD-10-CM

## 2024-09-15 DIAGNOSIS — D573 Sickle-cell trait: Secondary | ICD-10-CM | POA: Diagnosis not present

## 2024-09-15 DIAGNOSIS — J811 Chronic pulmonary edema: Secondary | ICD-10-CM | POA: Diagnosis not present

## 2024-09-15 DIAGNOSIS — R0989 Other specified symptoms and signs involving the circulatory and respiratory systems: Secondary | ICD-10-CM | POA: Diagnosis not present

## 2024-09-15 DIAGNOSIS — E8770 Fluid overload, unspecified: Secondary | ICD-10-CM | POA: Diagnosis not present

## 2024-09-15 DIAGNOSIS — L83 Acanthosis nigricans: Secondary | ICD-10-CM | POA: Diagnosis present

## 2024-09-15 DIAGNOSIS — I132 Hypertensive heart and chronic kidney disease with heart failure and with stage 5 chronic kidney disease, or end stage renal disease: Secondary | ICD-10-CM | POA: Diagnosis not present

## 2024-09-15 DIAGNOSIS — Z79899 Other long term (current) drug therapy: Secondary | ICD-10-CM

## 2024-09-15 DIAGNOSIS — N1832 Chronic kidney disease, stage 3b: Secondary | ICD-10-CM | POA: Diagnosis not present

## 2024-09-15 DIAGNOSIS — Z860101 Personal history of adenomatous and serrated colon polyps: Secondary | ICD-10-CM

## 2024-09-15 DIAGNOSIS — Z7985 Long-term (current) use of injectable non-insulin antidiabetic drugs: Secondary | ICD-10-CM | POA: Diagnosis not present

## 2024-09-15 DIAGNOSIS — Z833 Family history of diabetes mellitus: Secondary | ICD-10-CM

## 2024-09-15 DIAGNOSIS — R140 Abdominal distension (gaseous): Secondary | ICD-10-CM | POA: Diagnosis not present

## 2024-09-15 DIAGNOSIS — Z794 Long term (current) use of insulin: Secondary | ICD-10-CM | POA: Diagnosis not present

## 2024-09-15 DIAGNOSIS — J90 Pleural effusion, not elsewhere classified: Secondary | ICD-10-CM | POA: Diagnosis not present

## 2024-09-15 DIAGNOSIS — R531 Weakness: Secondary | ICD-10-CM | POA: Diagnosis not present

## 2024-09-15 DIAGNOSIS — E1169 Type 2 diabetes mellitus with other specified complication: Secondary | ICD-10-CM | POA: Diagnosis not present

## 2024-09-15 DIAGNOSIS — E7800 Pure hypercholesterolemia, unspecified: Secondary | ICD-10-CM | POA: Diagnosis present

## 2024-09-15 DIAGNOSIS — R069 Unspecified abnormalities of breathing: Secondary | ICD-10-CM | POA: Diagnosis not present

## 2024-09-15 DIAGNOSIS — E1159 Type 2 diabetes mellitus with other circulatory complications: Secondary | ICD-10-CM | POA: Diagnosis present

## 2024-09-15 DIAGNOSIS — I129 Hypertensive chronic kidney disease with stage 1 through stage 4 chronic kidney disease, or unspecified chronic kidney disease: Secondary | ICD-10-CM | POA: Diagnosis not present

## 2024-09-15 DIAGNOSIS — N189 Chronic kidney disease, unspecified: Secondary | ICD-10-CM | POA: Diagnosis not present

## 2024-09-15 DIAGNOSIS — E109 Type 1 diabetes mellitus without complications: Secondary | ICD-10-CM | POA: Diagnosis not present

## 2024-09-15 DIAGNOSIS — E1165 Type 2 diabetes mellitus with hyperglycemia: Secondary | ICD-10-CM | POA: Diagnosis not present

## 2024-09-15 DIAGNOSIS — N186 End stage renal disease: Secondary | ICD-10-CM | POA: Diagnosis present

## 2024-09-15 DIAGNOSIS — E7849 Other hyperlipidemia: Secondary | ICD-10-CM | POA: Diagnosis present

## 2024-09-15 DIAGNOSIS — I5033 Acute on chronic diastolic (congestive) heart failure: Secondary | ICD-10-CM | POA: Diagnosis not present

## 2024-09-15 DIAGNOSIS — Z888 Allergy status to other drugs, medicaments and biological substances status: Secondary | ICD-10-CM

## 2024-09-15 DIAGNOSIS — K760 Fatty (change of) liver, not elsewhere classified: Secondary | ICD-10-CM | POA: Diagnosis not present

## 2024-09-15 DIAGNOSIS — H409 Unspecified glaucoma: Secondary | ICD-10-CM | POA: Diagnosis present

## 2024-09-15 DIAGNOSIS — D638 Anemia in other chronic diseases classified elsewhere: Secondary | ICD-10-CM | POA: Diagnosis present

## 2024-09-15 DIAGNOSIS — I872 Venous insufficiency (chronic) (peripheral): Secondary | ICD-10-CM | POA: Diagnosis present

## 2024-09-15 DIAGNOSIS — Z8546 Personal history of malignant neoplasm of prostate: Secondary | ICD-10-CM

## 2024-09-15 DIAGNOSIS — T502X5A Adverse effect of carbonic-anhydrase inhibitors, benzothiadiazides and other diuretics, initial encounter: Secondary | ICD-10-CM | POA: Diagnosis not present

## 2024-09-15 LAB — URINALYSIS, ROUTINE W REFLEX MICROSCOPIC
Bacteria, UA: NONE SEEN
Bilirubin Urine: NEGATIVE
Glucose, UA: NEGATIVE mg/dL
Hgb urine dipstick: NEGATIVE
Ketones, ur: NEGATIVE mg/dL
Leukocytes,Ua: NEGATIVE
Nitrite: NEGATIVE
Protein, ur: 100 mg/dL — AB
Specific Gravity, Urine: 1.009 (ref 1.005–1.030)
pH: 5 (ref 5.0–8.0)

## 2024-09-15 LAB — BASIC METABOLIC PANEL WITH GFR
Anion gap: 13 (ref 5–15)
BUN: 41 mg/dL — ABNORMAL HIGH (ref 8–23)
CO2: 18 mmol/L — ABNORMAL LOW (ref 22–32)
Calcium: 8.1 mg/dL — ABNORMAL LOW (ref 8.9–10.3)
Chloride: 108 mmol/L (ref 98–111)
Creatinine, Ser: 3.7 mg/dL — ABNORMAL HIGH (ref 0.61–1.24)
GFR, Estimated: 17 mL/min — ABNORMAL LOW (ref >60)
Glucose, Bld: 174 mg/dL — ABNORMAL HIGH (ref 70–99)
Potassium: 3.8 mmol/L (ref 3.5–5.1)
Sodium: 139 mmol/L (ref 135–145)

## 2024-09-15 LAB — I-STAT CHEM 8, ED
BUN: 51 mg/dL — ABNORMAL HIGH (ref 8–23)
Calcium, Ion: 1.13 mmol/L — ABNORMAL LOW (ref 1.15–1.40)
Chloride: 107 mmol/L (ref 98–111)
Creatinine, Ser: 3.9 mg/dL — ABNORMAL HIGH (ref 0.61–1.24)
Glucose, Bld: 167 mg/dL — ABNORMAL HIGH (ref 70–99)
HCT: 27 % — ABNORMAL LOW (ref 39.0–52.0)
Hemoglobin: 9.2 g/dL — ABNORMAL LOW (ref 13.0–17.0)
Potassium: 3.8 mmol/L (ref 3.5–5.1)
Sodium: 140 mmol/L (ref 135–145)
TCO2: 22 mmol/L (ref 22–32)

## 2024-09-15 LAB — HEPATIC FUNCTION PANEL
ALT: 34 U/L (ref 0–44)
AST: 31 U/L (ref 15–41)
Albumin: 2.6 g/dL — ABNORMAL LOW (ref 3.5–5.0)
Alkaline Phosphatase: 82 U/L (ref 38–126)
Bilirubin, Direct: 0.3 mg/dL — ABNORMAL HIGH (ref 0.0–0.2)
Indirect Bilirubin: 0.7 mg/dL (ref 0.3–0.9)
Total Bilirubin: 1 mg/dL (ref 0.0–1.2)
Total Protein: 6.3 g/dL — ABNORMAL LOW (ref 6.5–8.1)

## 2024-09-15 LAB — CBC
HCT: 27.1 % — ABNORMAL LOW (ref 39.0–52.0)
Hemoglobin: 8.8 g/dL — ABNORMAL LOW (ref 13.0–17.0)
MCH: 27.8 pg (ref 26.0–34.0)
MCHC: 32.5 g/dL (ref 30.0–36.0)
MCV: 85.8 fL (ref 80.0–100.0)
Platelets: 280 K/uL (ref 150–400)
RBC: 3.16 MIL/uL — ABNORMAL LOW (ref 4.22–5.81)
RDW: 15.8 % — ABNORMAL HIGH (ref 11.5–15.5)
WBC: 10.3 K/uL (ref 4.0–10.5)
nRBC: 0 % (ref 0.0–0.2)

## 2024-09-15 LAB — CREATININE, URINE, RANDOM: Creatinine, Urine: 93 mg/dL

## 2024-09-15 LAB — BRAIN NATRIURETIC PEPTIDE: B Natriuretic Peptide: 728.9 pg/mL — ABNORMAL HIGH (ref 0.0–100.0)

## 2024-09-15 LAB — SODIUM, URINE, RANDOM: Sodium, Ur: 45 mmol/L

## 2024-09-15 LAB — CBG MONITORING, ED: Glucose-Capillary: 78 mg/dL (ref 70–99)

## 2024-09-15 MED ORDER — ONDANSETRON HCL 4 MG/2ML IJ SOLN: 4.0000 mg | Freq: Four times a day (QID) | INTRAMUSCULAR | Status: DC | PRN

## 2024-09-15 MED ORDER — DORZOLAMIDE HCL 2 % OP SOLN
1.0000 [drp] | Freq: Every day | OPHTHALMIC | Status: DC
  Administered 2024-09-16: 1 [drp] via OPHTHALMIC
  Filled 2024-09-15 (×2): qty 10

## 2024-09-15 MED ORDER — ONDANSETRON HCL 4 MG PO TABS: 4.0000 mg | ORAL_TABLET | Freq: Four times a day (QID) | ORAL | Status: DC | PRN

## 2024-09-15 MED ORDER — LEVOTHYROXINE SODIUM 25 MCG PO TABS
125.0000 ug | ORAL_TABLET | Freq: Every day | ORAL | Status: DC
  Administered 2024-09-16 – 2024-09-22 (×7): 125 ug via ORAL
  Filled 2024-09-15 (×7): qty 1

## 2024-09-15 MED ORDER — POTASSIUM CHLORIDE CRYS ER 10 MEQ PO TBCR: 10.0000 meq | EXTENDED_RELEASE_TABLET | Freq: Every day | ORAL | Status: DC

## 2024-09-15 MED ORDER — SENNOSIDES-DOCUSATE SODIUM 8.6-50 MG PO TABS
1.0000 | ORAL_TABLET | Freq: Every evening | ORAL | Status: DC | PRN
Start: 2024-09-15 — End: 2024-09-22

## 2024-09-15 MED ORDER — INSULIN ASPART 100 UNIT/ML IJ SOLN
0.0000 [IU] | Freq: Three times a day (TID) | INTRAMUSCULAR | Status: DC
  Administered 2024-09-16: 3 [IU] via SUBCUTANEOUS
  Administered 2024-09-17: 5 [IU] via SUBCUTANEOUS
  Administered 2024-09-17: 3 [IU] via SUBCUTANEOUS
  Administered 2024-09-17 – 2024-09-18 (×3): 2 [IU] via SUBCUTANEOUS
  Administered 2024-09-19: 1 [IU] via SUBCUTANEOUS
  Administered 2024-09-19: 7 [IU] via SUBCUTANEOUS
  Administered 2024-09-19 – 2024-09-20 (×4): 5 [IU] via SUBCUTANEOUS
  Administered 2024-09-21: 9 [IU] via SUBCUTANEOUS
  Administered 2024-09-22 (×2): 3 [IU] via SUBCUTANEOUS
  Filled 2024-09-15 (×2): qty 2
  Filled 2024-09-15: qty 9

## 2024-09-15 MED ORDER — INSULIN GLARGINE-YFGN 100 UNIT/ML ~~LOC~~ SOLN
6.0000 [IU] | Freq: Every day | SUBCUTANEOUS | Status: DC
  Administered 2024-09-16 – 2024-09-19 (×4): 6 [IU] via SUBCUTANEOUS
  Filled 2024-09-15 (×4): qty 0.06

## 2024-09-15 MED ORDER — IPRATROPIUM-ALBUTEROL 0.5-2.5 (3) MG/3ML IN SOLN
3.0000 mL | Freq: Once | RESPIRATORY_TRACT | Status: AC
  Administered 2024-09-15: 3 mL via RESPIRATORY_TRACT
  Filled 2024-09-15: qty 3

## 2024-09-15 MED ORDER — FUROSEMIDE 10 MG/ML IJ SOLN
60.0000 mg | INTRAMUSCULAR | Status: AC
  Administered 2024-09-15: 60 mg via INTRAVENOUS
  Filled 2024-09-15: qty 6

## 2024-09-15 MED ORDER — ALBUTEROL SULFATE HFA 108 (90 BASE) MCG/ACT IN AERS: 2.0000 | INHALATION_SPRAY | RESPIRATORY_TRACT | Status: DC | PRN

## 2024-09-15 MED ORDER — INSULIN GLARGINE-YFGN 100 UNIT/ML ~~LOC~~ SOLN: 6.0000 [IU] | Freq: Every day | SUBCUTANEOUS | Status: DC

## 2024-09-15 MED ORDER — FUROSEMIDE 10 MG/ML IJ SOLN
80.0000 mg | Freq: Two times a day (BID) | INTRAMUSCULAR | Status: DC
  Administered 2024-09-16 – 2024-09-17 (×3): 80 mg via INTRAVENOUS
  Filled 2024-09-15 (×3): qty 8

## 2024-09-15 MED ORDER — TIMOLOL MALEATE 0.5 % OP SOLN
1.0000 [drp] | Freq: Every day | OPHTHALMIC | Status: DC
  Administered 2024-09-16 – 2024-09-22 (×6): 1 [drp] via OPHTHALMIC
  Filled 2024-09-15 (×2): qty 5

## 2024-09-15 MED ORDER — SIMVASTATIN 20 MG PO TABS
20.0000 mg | ORAL_TABLET | Freq: Every evening | ORAL | Status: DC
Start: 2024-09-15 — End: 2024-09-22
  Administered 2024-09-15 – 2024-09-21 (×6): 20 mg via ORAL
  Filled 2024-09-15 (×6): qty 1

## 2024-09-15 MED ORDER — ACETAMINOPHEN 325 MG PO TABS
650.0000 mg | ORAL_TABLET | Freq: Four times a day (QID) | ORAL | Status: DC | PRN
Start: 2024-09-15 — End: 2024-09-22

## 2024-09-15 MED ORDER — INSULIN ASPART 100 UNIT/ML IJ SOLN
0.0000 [IU] | Freq: Every day | INTRAMUSCULAR | Status: DC
  Administered 2024-09-16 – 2024-09-20 (×3): 3 [IU] via SUBCUTANEOUS
  Administered 2024-09-21: 4 [IU] via SUBCUTANEOUS
  Filled 2024-09-15: qty 4
  Filled 2024-09-15: qty 3

## 2024-09-15 MED ORDER — HYDRALAZINE HCL 50 MG PO TABS
50.0000 mg | ORAL_TABLET | Freq: Two times a day (BID) | ORAL | Status: DC
  Administered 2024-09-15 – 2024-09-22 (×12): 50 mg via ORAL
  Filled 2024-09-15 (×12): qty 1

## 2024-09-15 MED ORDER — TIMOLOL HEMIHYDRATE 0.5 % OP SOLN: 1.0000 [drp] | Freq: Every day | OPHTHALMIC | Status: DC

## 2024-09-15 MED ORDER — CARVEDILOL 12.5 MG PO TABS
25.0000 mg | ORAL_TABLET | Freq: Two times a day (BID) | ORAL | Status: DC
  Administered 2024-09-15 – 2024-09-16 (×2): 25 mg via ORAL
  Filled 2024-09-15 (×2): qty 2

## 2024-09-15 MED ORDER — SODIUM CHLORIDE 0.9% FLUSH
3.0000 mL | Freq: Two times a day (BID) | INTRAVENOUS | Status: DC
  Administered 2024-09-16 – 2024-09-22 (×10): 3 mL via INTRAVENOUS

## 2024-09-15 MED ORDER — ACETAMINOPHEN 650 MG RE SUPP: 650.0000 mg | Freq: Four times a day (QID) | RECTAL | Status: DC | PRN

## 2024-09-15 MED ORDER — AMLODIPINE BESYLATE 5 MG PO TABS
5.0000 mg | ORAL_TABLET | Freq: Every day | ORAL | Status: DC
  Administered 2024-09-16 – 2024-09-22 (×5): 5 mg via ORAL
  Filled 2024-09-15 (×5): qty 1

## 2024-09-15 MED ORDER — HEPARIN SODIUM (PORCINE) 5000 UNIT/ML IJ SOLN
5000.0000 [IU] | Freq: Three times a day (TID) | INTRAMUSCULAR | Status: DC
  Administered 2024-09-15 – 2024-09-22 (×19): 5000 [IU] via SUBCUTANEOUS
  Filled 2024-09-15 (×20): qty 1

## 2024-09-15 NOTE — H&P (Addendum)
 History and Physical    Brendan [name]. FMW:[id number] DOB: 11-15-1956 DOA: 09/15/2024  PCP: Rexanne Ingle, MD  Patient coming from: Home  I have personally briefly reviewed patient's old medical records in [facility]  Chief Complaint: Shortness of breath, waking  HPI: Brendan [name]. is a 68 y.o. male with medical history significant for chronic HFpEF, CKD stage IV, T2DM, HTN, HLD, hypothyroidism, prostate cancer, anemia of chronic disease, OSA on CPAP who presented to the ED for evaluation of shortness of breath and weight gain.  Patient was recently admitted 10/10-10/16 for sepsis due to necrotizing fasciitis of the scrotum/perineum.  On arrival he was emergently taken to the OR for surgical debridement on 10/10.  He underwent I&D again on 10/12.  Wound culture from 10/10 with strep constellatus, MSSA, Prevotella bivia, beta-lactamase positive.  Patient was followed by ID and was treated with IV Unasyn in hospital and discharged on total 2-week course of Augmentin .  He was previously on Farxiga  which was discontinued at discharge.  He had associated AKI on CKD stage IV with creatinine maintained 3.5-3.8 compared to previous baseline ~3.0.  He has a finerenone , Entresto , and torsemide  were held.  Patient states that over the last 2 weeks he has had approximately an 18 pound weight gain.  He has had progressive shortness of breath these last 2 days.  Dyspnea occurs while at rest and worse with exertion.  He has increased swelling to both lower extremities.  He says his torsemide  was restarted 5 days ago but he has not had as much urine output as expected despite restarting the diuretic.  He denies chest pain, cough, nausea, vomiting.  He reports some recent loose stools.  His wife has been packing his surgical wound and she has not seen any worsening appearance or discharge.  ED Course  Labs/Imaging on admission: I have personally reviewed following labs and imaging studies.  Initial  vitals showed BP 159/82, pulse 60, RR 18, temp 97.9 F, SpO2 100% on room air.  Labs showed sodium 139, potassium 3.8, bicarb 18, BUN 41, creatinine 3.70, serum glucose 174, WBC 10.3, hemoglobin 8.8, platelets 280, BNP 728.9.  2 view chest x-ray showed cardiomegaly with pulmonary vascular congestion and mild interstitial edema.  Small bilateral pleural effusions and bibasilar atelectasis noted.  Patient was given IV Lasix  60 mg and DuoNeb treatment.  The hospitalist service was consulted for admission.  Review of Systems: All systems reviewed and are negative except as documented in history of present illness above.   Past Medical History:  Diagnosis Date   Acanthosis nigricans    Atopic dermatitis    CHF (congestive heart failure) (HCC)    CKD (chronic kidney disease) stage 4, GFR 15-29 ml/min (HCC) 01/08/2023   Diabetes (HCC) 11/19/2002   Erectile dysfunction    Fatty liver 11/19/2005   Glaucoma 11/19/2006   Gynecomastia 11/20/2007   Hx of adenomatous colonic polyps 08/26/2023   Hyperaldosteronism 11/19/1998   Hypercholesterolemia    Hyperlipidemia 2010   Hypertension 1999   Hypoglycemic reaction    Hypothyroidism 11/19/2002   Incontinence    Intermittent vertigo 11/19/2010   Left cervical radiculopathy 11/19/2010   Lumbar radiculopathy    Obesity    Peripheral neuropathy    Pollen allergies 11/19/2005   perennial   Prostate cancer (HCC) 11/19/2004   Reflux esophagitis 11/19/1993   Sickle cell trait 11/19/2004   Sleep apnea, obstructive 11/19/1998   uses a cpap   Venous insufficiency 11/20/2003   Vitamin  D deficiency 11/19/2010    Past Surgical History:  Procedure Laterality Date   COLONOSCOPY  2006   normal   INCISION AND DRAINAGE OF WOUND N/A 08/28/2024   Procedure: IRRIGATION AND EXCISIONAL DEBRIDEMENT WOUND;  Surgeon: Lyndel Deward PARAS, MD;  Location: [facility] OR;  Service: General;  Laterality: N/A;  EXCISIONAL DEBRIDEMENT   INCISION AND DRAINAGE PERIRECTAL  ABSCESS N/A 08/30/2024   Procedure: INCISION AND DRAINAGE OF PERINEAL WOUND;  Surgeon: Polly Cordella LABOR, MD;  Location: [facility] OR;  Service: General;  Laterality: N/A;   lap band surgery  2009   left inguinal hernia repair  1998   LIPOMA EXCISION Left 04/17/2023   Procedure: MINOR EXCISION LEFT BUTTOCK SEBACEOUS CYST;  Surgeon: Lyndel Deward PARAS, MD;  Location: [facility];  Service: General;  Laterality: Left;   robotic prostatectomy  2008    Social History: Social History   Tobacco Use   Smoking status: Never   Smokeless tobacco: Never  Vaping Use   Vaping status: Never Used  Substance Use Topics   Alcohol use: Yes    Comment: one drink every few months - 1965   Drug use: No   Allergies  Allergen Reactions   Ace Inhibitors Cough   Maxidex [Dexamethasone] Other (See Comments)    Sexual dysfunction   Verapamil Other (See Comments)    constipation    Family History  Problem Relation Age of Onset   Heart failure Mother    Hypertension Father        deceased age 68   Heart attack Father    COPD Sister    CVA Sister    Diabetes Daughter    Colon cancer Neg Hx    Stomach cancer Neg Hx    Colon polyps Neg Hx    Esophageal cancer Neg Hx    Rectal cancer Neg Hx      Prior to Admission medications   Medication Sig Start Date End Date Taking? Authorizing Provider  acetaminophen  (TYLENOL ) 500 MG tablet Take 500 mg by mouth every 6 (six) hours as needed for mild pain.    [provider]  amLODipine  (NORVASC ) 5 MG tablet Take 1 tablet (5 mg total) by mouth daily. 09/04/24 10/04/24  Perri LABOR Meliton Mickey., MD  carvedilol  (COREG ) 25 MG tablet Take 1 tablet (25 mg total) by mouth 2 (two) times daily with food 04/01/24     clobetasol  ointment (TEMOVATE ) 0.05 % Apply topically daily before sleeping. Patient not taking: Reported on 08/29/2024 05/19/24     Continuous Glucose Sensor (FREESTYLE LIBRE 3 PLUS SENSOR) MISC Apply to upper back of arm. Change every 15  days. 04/03/24     Continuous Glucose Sensor (FREESTYLE LIBRE 3 SENSOR) MISC Apply to upper back of arm and change ever 14 days. 01/28/24   Faythe Purchase, MD  dorzolamide (TRUSOPT) 2 % ophthalmic solution Place 1 drop into both eyes daily.    [provider]  Finerenone  (KERENDIA ) 20 MG TABS Take 20 mg by mouth daily.    [provider]  hydrALAZINE  (APRESOLINE ) 100 MG tablet Take 1/2 tablet (50 mg total) by mouth 2 (two) times daily. Patient taking differently: Take 50 mg by mouth daily. 08/30/23   Clegg, Amy D, NP  insulin  glargine (LANTUS  SOLOSTAR) 100 UNIT/ML Solostar Pen Inject 15 Units into the skin daily. Follow up with your PCP outpatient for further adjustment to your regimen 09/03/24   Perri LABOR Meliton Mickey., MD  insulin  lispro (HUMALOG  KWIKPEN) 200 UNIT/ML  KwikPen Inject 0-15 Units into the skin 3 (three) times daily with meals. CBG < 70: treat low blood sugar CBG 70 - 120: 0 units  CBG 121 - 150: 2 units  CBG 151 - 200: 3 units  CBG 201 - 250: 5 units  CBG 251 - 300: 8 units  CBG 301 - 350: 11 units  CBG 351 - 400: 15 units  CBG >400: Call MD 09/03/24   Perri DELENA Meliton Mickey., MD  Insulin  Pen Needle (TECHLITE PEN NEEDLES) 32G X 4 MM MISC Use to inject insulin  4 times daily 10/25/22   Faythe Purchase, MD  Insulin  Pen Needle 32G X 4 MM MISC Use to inject insulin  up to 4 times daily. 12/26/23   Faythe Purchase, MD  levothyroxine  (SYNTHROID ) 125 MCG tablet Take 1 tablet (125 mcg total) by mouth every morning on an empty stomach 06/08/24     potassium chloride  SA (KLOR-CON  M) 20 MEQ tablet Take 3 tablets (60 mEq total) by mouth daily. Patient taking differently: Take 40 mEq by mouth daily. 03/10/24   [place], Harlene HERO, FNP  sacubitril -valsartan  (ENTRESTO ) 49-51 MG Take 1 tablet by mouth 2 (two) times daily. 08/25/24   Bensimhon, Toribio SAUNDERS, MD  simvastatin  (ZOCOR ) 20 MG tablet Take 1 tablet (20 mg total) by mouth every evening. 07/09/24     timolol  (BETIMOL ) 0.5 % ophthalmic  solution Place 1 drop into both eyes daily.    [provider]  tirzepatide  (MOUNJARO ) 15 MG/0.5ML Pen Inject 15 mg into the skin once a week. 04/14/24     torsemide  (DEMADEX ) 20 MG tablet Take 3 tablets (60 mg total) by mouth daily. Patient taking differently: Take 20 mg by mouth daily. 08/08/23 08/29/24  Glena Harlene HERO, FNP    Physical Exam: Vitals:   09/15/24 2013 09/15/24 2030 09/15/24 2140 09/15/24 2148  BP:   (!) 179/81 (!) 179/81  Pulse:  (!) 59 60 62  Resp:  12  16  Temp:      TempSrc:      SpO2: 96% 100% 97% 96%  Weight:      Height:       Constitutional: Resting in bed with head elevated, NAD, calm, comfortable Eyes: EOMI, lids and conjunctivae normal ENMT: Mucous membranes are moist. Posterior pharynx clear of any exudate or lesions.Normal dentition.  Neck: normal, supple, no masses. Respiratory: clear to auscultation bilaterally, no wheezing, no crackles.  Slightly increased respiratory effort. No accessory muscle use.  Cardiovascular: Regular rate and rhythm, no murmurs / rubs / gallops.  +2 bilateral lower extremity edema. 2+ pedal pulses. Abdomen: no tenderness, no masses palpated. GU: Open surgical wound of perineum/scrotum without active discharge, packing in place.  Does not appear to have superimposed infection. Musculoskeletal: no clubbing / cyanosis. No joint deformity upper and lower extremities. Good ROM, no contractures. Normal muscle tone.  Skin: no rashes, lesions, ulcers. No induration Neurologic: Sensation intact. Strength 5/5 in all 4.  Psychiatric: Normal judgment and insight. Alert and oriented x 3. Normal mood.   EKG: Personally reviewed. Sinus rhythm, rate 62, low voltage.  Low voltage Compared to previous.  Assessment/Plan Principal Problem:   Acute on chronic heart failure with preserved ejection fraction (HFpEF, >= 50%) (HCC) Active Problems:   CKD (chronic kidney disease) stage 4, GFR 15-29 ml/min (HCC)   Insulin -requiring or  dependent type II diabetes mellitus (HCC)   Hypertension associated with diabetes (HCC)   Hypothyroidism   Obstructive sleep apnea   Hyperlipidemia associated with type  2 diabetes mellitus (HCC)   Anemia of chronic disease   Brendan [name]. is a 68 y.o. male with medical history significant for chronic HFpEF, CKD stage IV, T2DM, HTN, HLD, hypothyroidism, prostate cancer, anemia of chronic disease, OSA on CPAP who is admitted with acute on chronic HFpEF.  Assessment and Plan: Acute on chronic HFpEF: Patient presenting with 18 pound weight gain, progressive dyspnea, lower extremity swelling.  He is volume overloaded.  CXR with pulmonary edema.  BNP 728.9. TTE 05/07/2024 showed EF 60-65%. - Start IV Lasix  80 mg twice daily - Monitor strict I/O's and daily weights - Continue Coreg  25 mg twice daily - Entresto  and finerenone  on hold given renal dysfunction  AKI on CKD stage IV versus progression to CKD stage V: Prior to recent admission his baseline creatinine was ~3.0.  Creatinine on this admission is 3.7, similar to 10/16 when he was discharged previously.  This could be AKI from cardiorenal syndrome versus progression to CKD stage V.  He reports decreased urine output than expected with home torsemide . - Continue Lasix  as above - Holding Entresto  and finerenone  - If no significant improvement soon or remains oliguric then will need nephrology involvement - Obtain renal ultrasound and check urine studies  Recent admission for NSTI of scrotum/perineum: Wound appears stable without any sign of superimposed infection.  He has completed his course of Augmentin .  Will consult wound care for ongoing wound management.  Hypertension: Continue Coreg , amlodipine , hydralazine .  Holding Entresto .  Type 2 diabetes: Placed on SSI and Semglee  6 units daily.  Hemoglobin A1c 8.4% on 08/29/2024.  Hypothyroidism: Continue Synthroid .  Check TSH.  Hyperlipidemia: Continue simvastatin .  Anemia of  chronic disease: Hemoglobin 8.8 on admission, slightly decreased from baseline ~10.  No obvious bleeding, continue to monitor.  OSA: Continue CPAP nightly.  His spouse will bring his home machine in.   DVT prophylaxis: heparin  injection 5,000 Units Start: 09/15/24 2200 Code Status: Full code, discussed with patient on admission Family Communication: Spouse at bedside Disposition Plan: From home, dispo pending clinical progress Consults called: None Severity of Illness: The appropriate patient status for this patient is INPATIENT. Inpatient status is judged to be reasonable and necessary in order to provide the required intensity of service to ensure the patient's safety. The patient's presenting symptoms, physical exam findings, and initial radiographic and laboratory data in the context of their chronic comorbidities is felt to place them at high risk for further clinical deterioration. Furthermore, it is not anticipated that the patient will be medically stable for discharge from the hospital within 2 midnights of admission.   * I certify that at the point of admission it is my clinical judgment that the patient will require inpatient hospital care spanning beyond 2 midnights from the point of admission due to high intensity of service, high risk for further deterioration and high frequency of surveillance required.DEWAINE Jorie Blanch MD [facility]  If 7PM-7AM, please contact night-coverage [email or web address]  09/15/2024, 10:20 PM

## 2024-09-15 NOTE — ED Triage Notes (Signed)
 Recently DC for nec fasc in groin. Pt was taken off of diuretic. Since stopping, has had increasing SHOB. No CP. Distended abdomen. Weighed 275 upon admission to hospital. Today pt is 302lb. SHOB increases with exertion. Bp 164/90 spo2 94% ra hr 60 cbg 264.

## 2024-09-15 NOTE — ED Provider Triage Note (Signed)
 Emergency Medicine Provider Triage Evaluation Note  Brendan [name]. , a 68 y.o. male  was evaluated in triage.  Pt complains of shortness of breath..  Review of Systems  Positive: Shortness of breath edema Negative: Fevers  Physical Exam  BP (!) 159/82 (BP Location: Right Arm)   Pulse 60   Temp 97.9 F (36.6 C) (Oral)   Resp 18   Ht 6' 2 (1.88 m)   Wt (!) 138.8 kg   SpO2 100%   BMI 39.29 kg/m  Somewhat dyspneic with some wheezing.  Medical Decision Making  Medically screening exam initiated at 6:51 PM.  Appropriate orders placed.  Brendan [name]. was informed that the remainder of the evaluation will be completed by another provider, this initial triage assessment does not replace that evaluation, and the importance of remaining in the ED until their evaluation is complete.  Patient with recent neck fac surgery.  Now fluid is up around 30 pounds.  More short of breath.  More swelling of his legs.  Will need further workup.   Brendan Lot, MD 09/15/24 [phone]

## 2024-09-15 NOTE — Hospital Course (Signed)
 Brendan [name]. is a 68 y.o. male with medical history significant for chronic HFpEF, CKD stage IV, T2DM, HTN, HLD, hypothyroidism, prostate cancer, anemia of chronic disease, OSA on CPAP who is admitted with acute on chronic HFpEF.

## 2024-09-15 NOTE — ED Provider Notes (Signed)
 [place] EMERGENCY DEPARTMENT AT [facility] Provider Note   CSN: [id number] Arrival date & time: 09/15/24  8171     Patient presents with: Shortness of Breath   Brendan [name]. is a 68 y.o. male.  {Add pertinent medical, surgical, social history, OB history to HPI:2935} 68 year old male with a history of CHF, CKD, HTN, and diabetes who presents to the emergency department with shortness of breath, lower extremity swelling, weight gain, and abdominal swelling.  Patient reports that he was recently admitted and discharged from the hospital for Fournier's gangrene.  Did have an AKI and was taken off his torsemide  at that point in time.  Since going home has gained about 27 pounds.  Is having shortness of breath.  Also has noticed leg swelling all the way up to his abdomen.  Started taking his torsemide  Thursday after following up with nephrology but says that he has not been putting out the same amount of urine as usual.  No chest pain.  No cough.  No fevers or chills.  No history of COPD or asthma and does not smoke cigarettes ever       Prior to Admission medications   Medication Sig Start Date End Date Taking? Authorizing Provider  acetaminophen  (TYLENOL ) 500 MG tablet Take 500 mg by mouth every 6 (six) hours as needed for mild pain.    [provider]  amLODipine  (NORVASC ) 5 MG tablet Take 1 tablet (5 mg total) by mouth daily. 09/04/24 10/04/24  Perri DELENA Meliton Raddle., MD  carvedilol  (COREG ) 25 MG tablet Take 1 tablet (25 mg total) by mouth 2 (two) times daily with food 04/01/24     clobetasol  ointment (TEMOVATE ) 0.05 % Apply topically daily before sleeping. Patient not taking: Reported on 08/29/2024 05/19/24     Continuous Glucose Sensor (FREESTYLE LIBRE 3 PLUS SENSOR) MISC Apply to upper back of arm. Change every 15 days. 04/03/24     Continuous Glucose Sensor (FREESTYLE LIBRE 3 SENSOR) MISC Apply to upper back of arm and change ever 14 days. 01/28/24   Faythe Purchase, MD  dorzolamide (TRUSOPT) 2 % ophthalmic solution Place 1 drop into both eyes daily.    [provider]  Finerenone  (KERENDIA ) 20 MG TABS Take 20 mg by mouth daily.    [provider]  hydrALAZINE  (APRESOLINE ) 100 MG tablet Take 1/2 tablet (50 mg total) by mouth 2 (two) times daily. Patient taking differently: Take 50 mg by mouth daily. 08/30/23   Clegg, Amy D, NP  insulin  glargine (LANTUS  SOLOSTAR) 100 UNIT/ML Solostar Pen Inject 15 Units into the skin daily. Follow up with your PCP outpatient for further adjustment to your regimen 09/03/24   Perri DELENA Meliton Raddle., MD  insulin  lispro (HUMALOG  KWIKPEN) 200 UNIT/ML KwikPen Inject 0-15 Units into the skin 3 (three) times daily with meals. CBG < 70: treat low blood sugar CBG 70 - 120: 0 units  CBG 121 - 150: 2 units  CBG 151 - 200: 3 units  CBG 201 - 250: 5 units  CBG 251 - 300: 8 units  CBG 301 - 350: 11 units  CBG 351 - 400: 15 units  CBG >400: Call MD 09/03/24   Perri DELENA Meliton Raddle., MD  Insulin  Pen Needle (TECHLITE PEN NEEDLES) 32G X 4 MM MISC Use to inject insulin  4 times daily 10/25/22   Faythe Purchase, MD  Insulin  Pen Needle 32G X 4 MM MISC Use to inject insulin  up to 4 times daily. 12/26/23  Faythe Purchase, MD  levothyroxine  (SYNTHROID ) 125 MCG tablet Take 1 tablet (125 mcg total) by mouth every morning on an empty stomach 06/08/24     potassium chloride  SA (KLOR-CON  M) 20 MEQ tablet Take 3 tablets (60 mEq total) by mouth daily. Patient taking differently: Take 40 mEq by mouth daily. 03/10/24   Milford, Harlene HERO, FNP  sacubitril -valsartan  (ENTRESTO ) 49-51 MG Take 1 tablet by mouth 2 (two) times daily. 08/25/24   Bensimhon, Toribio SAUNDERS, MD  simvastatin  (ZOCOR ) 20 MG tablet Take 1 tablet (20 mg total) by mouth every evening. 07/09/24     timolol  (BETIMOL ) 0.5 % ophthalmic solution Place 1 drop into both eyes daily.    [provider]  tirzepatide  (MOUNJARO ) 15 MG/0.5ML Pen Inject 15 mg into the skin once a week.  04/14/24     torsemide  (DEMADEX ) 20 MG tablet Take 3 tablets (60 mg total) by mouth daily. Patient taking differently: Take 20 mg by mouth daily. 08/08/23 08/29/24  Glena Harlene HERO, FNP    Allergies: Ace inhibitors, Maxidex [dexamethasone], and Verapamil    Review of Systems  Updated Vital Signs BP (!) 159/82 (BP Location: Right Arm)   Pulse 60   Temp 97.9 F (36.6 C) (Oral)   Resp 18   Ht 6' 2 (1.88 m)   Wt (!) 138.8 kg   SpO2 100%   BMI 39.29 kg/m   Physical Exam Vitals and nursing note reviewed.  Constitutional:      General: He is not in acute distress.    Appearance: He is well-developed.  HENT:     Head: Normocephalic and atraumatic.     Right Ear: External ear normal.     Left Ear: External ear normal.     Nose: Nose normal.  Eyes:     Extraocular Movements: Extraocular movements intact.     Conjunctiva/sclera: Conjunctivae normal.     Pupils: Pupils are equal, round, and reactive to light.  Cardiovascular:     Rate and Rhythm: Normal rate and regular rhythm.     Heart sounds: Normal heart sounds.  Pulmonary:     Effort: Pulmonary effort is normal. No respiratory distress.     Comments: Wheezing from upper airway appreciated diminished breath sounds bilaterally in the bases Abdominal:     General: There is distension.     Palpations: Abdomen is soft. There is no mass.     Tenderness: There is no abdominal tenderness. There is no guarding.  Musculoskeletal:     Cervical back: Normal range of motion and neck supple.     Right lower leg: Edema present.     Left lower leg: Edema present.  Skin:    General: Skin is warm and dry.  Neurological:     Mental Status: He is alert. Mental status is at baseline.  Psychiatric:        Mood and Affect: Mood normal.        Behavior: Behavior normal.     (all labs ordered are listed, but only abnormal results are displayed) Labs Reviewed  BASIC METABOLIC PANEL WITH GFR  CBC  BRAIN NATRIURETIC PEPTIDE  HEPATIC  FUNCTION PANEL    EKG: None  Radiology: No results found.  {Document cardiac monitor, telemetry assessment procedure when appropriate:32947} Procedures   Medications Ordered in the ED  albuterol  (VENTOLIN  HFA) 108 (90 Base) MCG/ACT inhaler 2 puff (has no administration in time range)    Clinical Course as of 09/15/24 1930  Tue Sep 15, 2024  1929 Creatinine(!): 3.90 Increased from 3.63 days [RP]    Clinical Course User Index [RP] Yolande Lamar BROCKS, MD   {Click here for ABCD2, HEART and other calculators REFRESH Note before signing:1}                              Medical Decision Making Amount and/or Complexity of Data Reviewed Labs: ordered. Decision-making details documented in ED Course. Radiology: ordered.  Risk Prescription drug management. Decision regarding hospitalization.   ***  {Document critical care time when appropriate  Document review of labs and clinical decision tools ie CHADS2VASC2, etc  Document your independent review of radiology images and any outside records  Document your discussion with family members, caretakers and with consultants  Document social determinants of health affecting pt's care  Document your decision making why or why not admission, treatments were needed:32947:::1}   Final diagnoses:  None    ED Discharge Orders     None

## 2024-09-15 NOTE — ED Notes (Signed)
 Pt had soup and sandwich to eat.

## 2024-09-16 ENCOUNTER — Inpatient Hospital Stay (HOSPITAL_COMMUNITY)

## 2024-09-16 DIAGNOSIS — N179 Acute kidney failure, unspecified: Secondary | ICD-10-CM | POA: Diagnosis not present

## 2024-09-16 DIAGNOSIS — I5033 Acute on chronic diastolic (congestive) heart failure: Secondary | ICD-10-CM | POA: Diagnosis not present

## 2024-09-16 DIAGNOSIS — N189 Chronic kidney disease, unspecified: Secondary | ICD-10-CM | POA: Diagnosis not present

## 2024-09-16 LAB — GLUCOSE, CAPILLARY
Glucose-Capillary: 220 mg/dL — ABNORMAL HIGH (ref 70–99)
Glucose-Capillary: 297 mg/dL — ABNORMAL HIGH (ref 70–99)

## 2024-09-16 LAB — CBG MONITORING, ED
Glucose-Capillary: 83 mg/dL (ref 70–99)
Glucose-Capillary: 112 mg/dL — ABNORMAL HIGH (ref 70–99)
Glucose-Capillary: 154 mg/dL — ABNORMAL HIGH (ref 70–99)
Glucose-Capillary: 132 mg/dL — ABNORMAL HIGH (ref 70–99)

## 2024-09-16 LAB — CBC
HCT: 24.6 % — ABNORMAL LOW (ref 39.0–52.0)
Hemoglobin: 8.2 g/dL — ABNORMAL LOW (ref 13.0–17.0)
MCH: 28.1 pg (ref 26.0–34.0)
MCHC: 33.3 g/dL (ref 30.0–36.0)
MCV: 84.2 fL (ref 80.0–100.0)
Platelets: 262 K/uL (ref 150–400)
RBC: 2.92 MIL/uL — ABNORMAL LOW (ref 4.22–5.81)
RDW: 15.7 % — ABNORMAL HIGH (ref 11.5–15.5)
WBC: 10 K/uL (ref 4.0–10.5)
nRBC: 0 % (ref 0.0–0.2)

## 2024-09-16 LAB — BASIC METABOLIC PANEL WITH GFR
Anion gap: 12 (ref 5–15)
BUN: 40 mg/dL — ABNORMAL HIGH (ref 8–23)
CO2: 18 mmol/L — ABNORMAL LOW (ref 22–32)
Calcium: 8.2 mg/dL — ABNORMAL LOW (ref 8.9–10.3)
Chloride: 111 mmol/L (ref 98–111)
Creatinine, Ser: 3.75 mg/dL — ABNORMAL HIGH (ref 0.61–1.24)
GFR, Estimated: 17 mL/min — ABNORMAL LOW (ref >60)
Glucose, Bld: 155 mg/dL — ABNORMAL HIGH (ref 70–99)
Potassium: 3.4 mmol/L — ABNORMAL LOW (ref 3.5–5.1)
Sodium: 141 mmol/L (ref 135–145)

## 2024-09-16 LAB — T4, FREE: Free T4: 0.81 ng/dL (ref 0.61–1.12)

## 2024-09-16 LAB — TSH: TSH: 7.823 u[IU]/mL — ABNORMAL HIGH (ref 0.350–4.500)

## 2024-09-16 LAB — MAGNESIUM: Magnesium: 1.8 mg/dL (ref 1.7–2.4)

## 2024-09-16 MED ORDER — POTASSIUM CHLORIDE CRYS ER 20 MEQ PO TBCR
40.0000 meq | EXTENDED_RELEASE_TABLET | Freq: Once | ORAL | Status: AC
  Administered 2024-09-16: 40 meq via ORAL
  Filled 2024-09-16: qty 2

## 2024-09-16 MED ORDER — CARVEDILOL 12.5 MG PO TABS
12.5000 mg | ORAL_TABLET | Freq: Two times a day (BID) | ORAL | Status: DC
  Administered 2024-09-16 – 2024-09-22 (×9): 12.5 mg via ORAL
  Filled 2024-09-16 (×9): qty 1

## 2024-09-16 MED ORDER — IPRATROPIUM-ALBUTEROL 0.5-2.5 (3) MG/3ML IN SOLN
3.0000 mL | Freq: Four times a day (QID) | RESPIRATORY_TRACT | Status: DC | PRN
  Administered 2024-09-16 – 2024-09-18 (×7): 3 mL via RESPIRATORY_TRACT
  Filled 2024-09-16 (×11): qty 3

## 2024-09-16 NOTE — Consult Note (Addendum)
 Advanced Heart Failure Team Consult Note  Primary Physician: Rexanne Ingle, MD Cardiologist:  Dr. Cherrie Reason for Consultation: Acute on chronic HFpEF HPI:    Brendan [name]. is seen today for evaluation of acute on chronic HFpEF at the request of Dr. Melia.   Brendan Neysa Raddle. Is a 68 y.o. male with history of HFpEF, HTN, OSA on CPAP, DM2, hypothyroidism, CKD IV, and obesity.    Admitted to [facility] with A/C diastolic heart failure. Discharge weight 301 pounds.   Admitted 2/24 with A/C HFpEF. High sodium diet played a role in volume overload. Overall diuresed 23 pounds. Transitioned to torsemide  80 mg daily. GDMT adjusted. Enrolled in FASTR trial usual care arm. Discharge weight 294 pounds.   Has followed with AHF since. Last seen in April 2025. NYHA III, although in part limited by body habitus. Last echo with EF 60-65% with G1DD. He did require admission earlier this month for sepsis due to necrotizing fascitis s/p I&D and antibiotics.   Presented to [facility] 09/15/24 with dyspnea, significant edema,  and weight goin (18lbs in the last 2 weeks. He was recently seen by Dr. Marlee and torsemide  was increase to 40mg  daily (5 days ago). Reports that he has not been responding to the torsemide  as he has in the past. On arrival, hypertensive 159/82, HR in 60s, normotensive, 100% O2 on RA. Labsin ED notable for K 3.8, BUN/Cr 41/3.7, BNP 728, hgb 8.8. ECG showed SR 61 bpm, low voltage. CXR with cardiomegaly, and significant pulmonary edema. He was started on IV Lasix  and nephrology consulted.   Today, reports that he has been peeing with the IV Lasix . Breathing has improved some. Remains with pitting edema in BLE through thighs. No CP.   Home Medications Prior to Admission medications   Medication Sig Start Date End Date Taking? Authorizing Provider  acetaminophen  (TYLENOL ) 500 MG tablet Take 500 mg by mouth every 6 (six) hours as needed for mild pain.   Yes [provider]  amLODipine   (NORVASC ) 5 MG tablet Take 1 tablet (5 mg total) by mouth daily. 09/04/24 10/04/24 Yes Perri DELENA Meliton Raddle., MD  carvedilol  (COREG ) 25 MG tablet Take 1 tablet (25 mg total) by mouth 2 (two) times daily with food 04/01/24  Yes   Continuous Glucose Sensor (FREESTYLE LIBRE 3 PLUS SENSOR) MISC Apply to upper back of arm. Change every 15 days. 04/03/24  Yes   Continuous Glucose Sensor (FREESTYLE LIBRE 3 SENSOR) MISC Apply to upper back of arm and change ever 14 days. 01/28/24  Yes Faythe Purchase, MD  dorzolamide (TRUSOPT) 2 % ophthalmic solution Place 1 drop into both eyes daily.   Yes [provider]  hydrALAZINE  (APRESOLINE ) 100 MG tablet Take 1/2 tablet (50 mg total) by mouth 2 (two) times daily. 08/30/23  Yes Clegg, Amy D, NP  insulin  glargine (LANTUS  SOLOSTAR) 100 UNIT/ML Solostar Pen Inject 15 Units into the skin daily. Follow up with your PCP outpatient for further adjustment to your regimen 09/03/24  Yes Perri DELENA Meliton Raddle., MD  insulin  lispro (HUMALOG  KWIKPEN) 200 UNIT/ML KwikPen Inject 0-15 Units into the skin 3 (three) times daily with meals. CBG < 70: treat low blood sugar CBG 70 - 120: 0 units  CBG 121 - 150: 2 units  CBG 151 - 200: 3 units  CBG 201 - 250: 5 units  CBG 251 - 300: 8 units  CBG 301 - 350: 11 units  CBG 351 - 400: 15 units  CBG >  400: Call MD 09/03/24  Yes Perri DELENA Meliton Mickey., MD  levothyroxine  (SYNTHROID ) 125 MCG tablet Take 1 tablet (125 mcg total) by mouth every morning on an empty stomach 06/08/24  Yes   simvastatin  (ZOCOR ) 20 MG tablet Take 1 tablet (20 mg total) by mouth every evening. 07/09/24  Yes   timolol  (BETIMOL ) 0.5 % ophthalmic solution Place 1 drop into both eyes daily.   Yes [provider]  tirzepatide  (MOUNJARO ) 15 MG/0.5ML Pen Inject 15 mg into the skin once a week. 04/14/24  Yes   torsemide  (DEMADEX ) 20 MG tablet Take 3 tablets (60 mg total) by mouth daily. Patient taking differently: Take 40 mg by mouth daily. 08/08/23 09/15/25 Yes  Milford, Harlene HERO, FNP  clobetasol  ointment (TEMOVATE ) 0.05 % Apply topically daily before sleeping. Patient not taking: Reported on 08/29/2024 05/19/24     Finerenone  (KERENDIA ) 20 MG TABS Take 20 mg by mouth daily. Patient not taking: Reported on 09/15/2024    [provider]  Insulin  Pen Needle (TECHLITE PEN NEEDLES) 32G X 4 MM MISC Use to inject insulin  4 times daily 10/25/22   Faythe Purchase, MD  Insulin  Pen Needle 32G X 4 MM MISC Use to inject insulin  up to 4 times daily. 12/26/23   Faythe Purchase, MD  potassium chloride  SA (KLOR-CON  M) 20 MEQ tablet Take 3 tablets (60 mEq total) by mouth daily. Patient not taking: Reported on 09/15/2024 03/10/24   Glena Harlene HERO, FNP  sacubitril -valsartan  (ENTRESTO ) 49-51 MG Take 1 tablet by mouth 2 (two) times daily. Patient not taking: Reported on 09/15/2024 08/25/24   Saraia Platner, Toribio SAUNDERS, MD    Past Medical History: Past Medical History:  Diagnosis Date   Acanthosis nigricans    Atopic dermatitis    CHF (congestive heart failure) (HCC)    CKD (chronic kidney disease) stage 4, GFR 15-29 ml/min (HCC) 01/08/2023   Diabetes (HCC) 11/19/2002   Erectile dysfunction    Fatty liver 11/19/2005   Glaucoma 11/19/2006   Gynecomastia 11/20/2007   Hx of adenomatous colonic polyps 08/26/2023   Hyperaldosteronism 11/19/1998   Hypercholesterolemia    Hyperlipidemia 2010   Hypertension 1999   Hypoglycemic reaction    Hypothyroidism 11/19/2002   Incontinence    Intermittent vertigo 11/19/2010   Left cervical radiculopathy 11/19/2010   Lumbar radiculopathy    Obesity    Peripheral neuropathy    Pollen allergies 11/19/2005   perennial   Prostate cancer (HCC) 11/19/2004   Reflux esophagitis 11/19/1993   Sickle cell trait 11/19/2004   Sleep apnea, obstructive 11/19/1998   uses a cpap   Venous insufficiency 11/20/2003   Vitamin D deficiency 11/19/2010    Past Surgical History: Past Surgical History:  Procedure Laterality Date   COLONOSCOPY   2006   normal   INCISION AND DRAINAGE OF WOUND N/A 08/28/2024   Procedure: IRRIGATION AND EXCISIONAL DEBRIDEMENT WOUND;  Surgeon: Lyndel Deward PARAS, MD;  Location: [facility] OR;  Service: General;  Laterality: N/A;  EXCISIONAL DEBRIDEMENT   INCISION AND DRAINAGE PERIRECTAL ABSCESS N/A 08/30/2024   Procedure: INCISION AND DRAINAGE OF PERINEAL WOUND;  Surgeon: Polly Cordella DELENA, MD;  Location: [facility] OR;  Service: General;  Laterality: N/A;   lap band surgery  2009   left inguinal hernia repair  1998   LIPOMA EXCISION Left 04/17/2023   Procedure: MINOR EXCISION LEFT BUTTOCK SEBACEOUS CYST;  Surgeon: Lyndel Deward PARAS, MD;  Location: [facility];  Service: General;  Laterality: Left;   robotic prostatectomy  2008  Family History: Family History  Problem Relation Age of Onset   Heart failure Mother    Hypertension Father        deceased age 20   Heart attack Father    COPD Sister    CVA Sister    Diabetes Daughter    Colon cancer Neg Hx    Stomach cancer Neg Hx    Colon polyps Neg Hx    Esophageal cancer Neg Hx    Rectal cancer Neg Hx     Social History: Social History   Socioeconomic History   Marital status: Married    Spouse name: Not on file   Number of children: 3   Years of education: Not on file   Highest education level: Not on file  Occupational History   Occupation: truck driver    Comment: Retired futures trader  Tobacco Use   Smoking status: Never   Smokeless tobacco: Never  Vaping Use   Vaping status: Never Used  Substance and Sexual Activity   Alcohol use: Yes    Comment: one drink every few months - 1965   Drug use: No   Sexual activity: Yes  Other Topics Concern   Not on file  Social History Narrative   Right handed   Lives in a two story home    Drinks no caffeine    Social Drivers of Corporate Investment Banker Strain: Low Risk  (01/08/2023)   Overall Financial Resource Strain (CARDIA)    Difficulty of Paying Living Expenses:  Not hard at all  Food Insecurity: Patient Unable To Answer (08/31/2024)   Hunger Vital Sign    Worried About Running Out of Food in the Last Year: Patient unable to answer    Ran Out of Food in the Last Year: Patient unable to answer  Transportation Needs: No Transportation Needs (08/31/2024)   PRAPARE - Administrator, Civil Service (Medical): No    Lack of Transportation (Non-Medical): No  Physical Activity: Not on file  Stress: Not on file  Social Connections: Socially Integrated (08/31/2024)   Social Connection and Isolation Panel    Frequency of Communication with Friends and Family: More than three times a week    Frequency of Social Gatherings with Friends and Family: More than three times a week    Attends Religious Services: More than 4 times per year    Active Member of [employer] or Organizations: Yes    Attends Engineer, Structural: More than 4 times per year    Marital Status: Married    Allergies:  Allergies  Allergen Reactions   Ace Inhibitors Cough   Maxidex [Dexamethasone] Other (See Comments)    Sexual dysfunction   Verapamil Other (See Comments)    constipation    Objective:    Vital Signs:   Temp:  [97.3 F (36.3 C)-98 F (36.7 C)] 97.3 F (36.3 C) (10/29 1200) Pulse Rate:  [55-71] 62 (10/29 1200) Resp:  [12-25] 25 (10/29 1200) BP: (112-179)/(61-109) 124/73 (10/29 1200) SpO2:  [95 %-100 %] 98 % (10/29 1200) Weight:  [138.8 kg] 138.8 kg (10/28 1837)   Weight change: Filed Weights   09/15/24 1837  Weight: (!) 138.8 kg   Intake/Output:  Intake/Output Summary (Last 24 hours) at 09/16/2024 1243 Last data filed at 09/16/2024 0617 Gross per 24 hour  Intake --  Output 150 ml  Net -150 ml    Physical Exam    General: Obese appearing. No distress on RA Cardiac:  JVP to jaw. S1 and S2 present. No murmurs Extremities: Warm and dry.  2+ BLE edema.  Neuro: Alert and oriented x3. Affect pleasant.   Telemetry   SR 60-70s (personally  reviewed)  Labs   Basic Metabolic Panel: Recent Labs  Lab 09/15/24 1846 09/15/24 1913 09/16/24 0250  NA 139 140 141  K 3.8 3.8 3.4*  CL 108 107 111  CO2 18*  --  18*  GLUCOSE 174* 167* 155*  BUN 41* 51* 40*  CREATININE 3.70* 3.90* 3.75*  CALCIUM 8.1*  --  8.2*  MG  --   --  1.8    Liver Function Tests: Recent Labs  Lab 09/15/24 1846  AST 31  ALT 34  ALKPHOS 82  BILITOT 1.0  PROT 6.3*  ALBUMIN 2.6*   No results for input(s): LIPASE, AMYLASE in the last 168 hours. No results for input(s): AMMONIA in the last 168 hours.  CBC: Recent Labs  Lab 09/15/24 1846 09/15/24 1913 09/16/24 0250  WBC 10.3  --  10.0  HGB 8.8* 9.2* 8.2*  HCT 27.1* 27.0* 24.6*  MCV 85.8  --  84.2  PLT 280  --  262   Cardiac Enzymes: No results for input(s): CKTOTAL, CKMB, CKMBINDEX, TROPONINI in the last 168 hours.  BNP (last 3 results) Recent Labs    01/20/24 1042 09/15/24 1846  BNP 295.6* 728.9*   ProBNP (last 3 results) Recent Labs    08/28/24 1613  PROBNP 6,038.0*   CBG: Recent Labs  Lab 09/15/24 2242 09/16/24 0749 09/16/24 1102 09/16/24 1152  GLUCAP 78 83 112* 154*   Coagulation Studies: No results for input(s): LABPROT, INR in the last 72 hours.  Medications:    Current Medications:  amLODipine   5 mg Oral Daily   carvedilol   25 mg Oral BID WC   dorzolamide  1 drop Both Eyes QHS   furosemide   80 mg Intravenous Q12H   heparin   5,000 Units Subcutaneous Q8H   hydrALAZINE   50 mg Oral BID   insulin  aspart  0-5 Units Subcutaneous QHS   insulin  aspart  0-9 Units Subcutaneous TID WC   insulin  glargine-yfgn  6 Units Subcutaneous Daily   levothyroxine   125 mcg Oral Q0600   simvastatin   20 mg Oral QPM   sodium chloride  flush  3 mL Intravenous Q12H   timolol   1 drop Both Eyes Daily   Infusions:  Patient Profile   68 y.o. male with history of HFpEF, HTN, OSA on CPAP, DM2, hypothyroidism, CKD IV, and obesity.   Assessment/Plan   1. Acute on  chronic - Echo 2/24: EF 55-60% RV ok. Grade II DD - Echo 6/25: EF 60-65%, RV ok. G1DD - NYHA IV on exam. Diuresis at home has been limited by renal dysfunction - significant volume overload on exam, needs volume off - diuretics and need for HD per nephrology - decrease coreg  12.5 mg bid with volume overload - hold entresto  with renal function - off SGLT2i with scrotal/sacral wound (nec fasc) - continue hydralazine  50 mg bid - place UNNA boots   2. HTN  - BP stable - pre-syncope with SBP < 130 - continue hydral  3. CKD Stage IV - Baseline SCr 2.4-2.8 - up to 3.75 today, follow with diuresis - renal US  RK 8.5cm LK 9.3cm with no e/o hydronephrosis  - diuretics and need for HD per nephrology   3. DMII - Hgb A1C 8.4 - per primary   4. OSA - Continue CPAP at night  5. Obesity  - Body mass index is 39.29 kg/m. - On Mounjaro  in OP, has been losing weight   Length of Stay: 1  Jordan Lee, NP  09/16/2024, 12:43 PM  Advanced Heart Failure Team Pager [phone] (M-F; 7a - 5p)  Please contact [facility] for night-coverage after hours (4p -7a ) and weekends on [email or web address]  Agree with above  68 y/o male with HFpEF, CKDIV, DM2 recently admitted for necrotizing fasciitis.   Now presents to to the ED with significant volume overload with weight up 20 pound. Has been taking torsemide  40 daily   Scr 3.7   General:  Sitting up in bed. No resp difficulty HEENT: normal Neck: supple. JVP to ear Cor: Regular rate & rhythm. No rubs, gallops or murmurs. Lungs: clear Abdomen: soft, nontender, + distended.Good bowel sounds. Extremities: no cyanosis, clubbing, rash, 2-3+ edema Neuro: alert & orientedx3, cranial nerves grossly intact. moves all 4 extremities w/o difficulty. Affect pleasant  He has significant volume overload in setting of  HFpEF and CKD IV. Continue IV diuresis per Nephrology. If we can diurese him successfully will need higher dose of po torsemide  on d/c. If unable to  diurese well will need HD initiation.   Toribio Fuel, MD  4:58 PM

## 2024-09-16 NOTE — ED Notes (Signed)
 [facility] called pt on moniter

## 2024-09-16 NOTE — Consult Note (Signed)
 Reason for Consult: Renal failure Referring Physician: Dr. Fairy  Chief Complaint: Shortness of breath  Assessment/Plan: Acute on CKD IV followed by Dr. Marlee. Concerned he may have progressed and is now CKDV with dialysis unfortunately possible in the near future. - Will try aggressive diuresis; no absolute indication for dialysis.  - Renal u/s RK 8.5cm LK 9.3cm with no e/o hydronephrosis - Will send urine lytes as well.  -Monitor Daily I/Os, Daily weight  -Maintain MAP>65 for optimal renal perfusion.  - Avoid nephrotoxic agents such as IV contrast, NSAIDs, and phosphate containing bowel preps (FLEETS)  HFpEF EF 60-65% - followed by Dr. Cherrie, restarted on Torsemide  40mg  daily last Friday by Dr. Marlee with initially good response; pt states ankle edema initially got better. Hypertension - initially but now much better controlled borderline relative hypotension for him. T2DM on SSI and long acting insulin  being managed by the primary. Anemia -will check iron panels but with possible active infection would not like to use IV iron.  With active inflammation ESA's also will not be very effective. OSA on CPAP   HPI: Brendan [name]. is an 68 y.o. male with a history of HFpEF, type 2 diabetes, hypertension, hyperlipidemia, hypothyroidism, prostate cancer, anemia of chronic disease, OSA on CPAP, CKD 4 followed by Dr. Marlee last seen in early October.  Patient recently admitted with necrotizing fasciitis of the scrotum/perineum status post surgical debridement on 10/10 as well as 10/12 with culture growing multipole organisms. treated with Unasyn as well as a outpatient 2-week course of Augmentin .  Farxiga  was discontinued at time of discharge as his creatinine was slightly elevated compared to baseline which is approximately 3.  Of note finerenone , Entresto  and torsemide  were held at time of discharge.  Unfortunately he has had a 18 pound weight gain since discharge with worsening shortness  of breath past couple days before presentation especially with exertion, increased swelling in the lower extremities.  Torsemide  was restarted last Friday (5 days prior to presentation) by Dr. Marlee. In the ED BP was elevated Cr 3.7 BNP 729 and CXR   ROS Pertinent items are noted in HPI.   Chemistry and CBC: Creatinine, Ser  Date/Time Value Ref Range Status  09/16/2024 02:50 AM 3.75 (H) 0.61 - 1.24 mg/dL Final  89/71/7974 92:86 PM 3.90 (H) 0.61 - 1.24 mg/dL Final  89/71/7974 93:53 PM 3.70 (H) 0.61 - 1.24 mg/dL Final  89/83/7974 95:40 AM 3.63 (H) 0.61 - 1.24 mg/dL Final  89/84/7974 98:51 AM 3.55 (H) 0.61 - 1.24 mg/dL Final  89/85/7974 96:57 AM 3.81 (H) 0.61 - 1.24 mg/dL Final  89/86/7974 95:73 AM 3.52 (H) 0.61 - 1.24 mg/dL Final  89/88/7974 96:73 AM 3.55 (H) 0.61 - 1.24 mg/dL Final  89/89/7974 95:86 PM 3.79 (H) 0.61 - 1.24 mg/dL Final  94/91/7974 89:72 AM 3.08 (H) 0.61 - 1.24 mg/dL Final  95/77/7974 87:86 PM 2.88 (H) 0.61 - 1.24 mg/dL Final  96/96/7974 89:57 AM 3.09 (H) 0.61 - 1.24 mg/dL Final  91/77/7975 88:56 AM 2.68 (H) 0.61 - 1.24 mg/dL Final  93/96/7975 88:81 AM 2.74 (H) 0.61 - 1.24 mg/dL Final  94/77/7975 97:45 PM 2.83 (H) 0.61 - 1.24 mg/dL Final  94/89/7975 96:61 PM 2.82 (H) 0.61 - 1.24 mg/dL Final  94/96/7975 90:86 AM 2.75 (H) 0.61 - 1.24 mg/dL Final  95/80/7975 87:49 PM 3.08 (H) 0.61 - 1.24 mg/dL Final  95/90/7975 97:46 PM 2.84 (H) 0.61 - 1.24 mg/dL Final  96/70/7975 88:62 AM 2.88 (H) 0.61 - 1.24 mg/dL Final  01/28/2023 08:58 AM 2.91 (H) 0.61 - 1.24 mg/dL Final  97/71/7975 98:46 PM 3.45 (H) 0.61 - 1.24 mg/dL Final  97/76/7975 97:59 PM 2.93 (H) 0.61 - 1.24 mg/dL Final  97/76/7975 93:87 AM 2.78 (H) 0.61 - 1.24 mg/dL Final  97/77/7975 95:51 PM 2.63 (H) 0.61 - 1.24 mg/dL Final  97/77/7975 92:94 AM 2.47 (H) 0.61 - 1.24 mg/dL Final  97/78/7975 96:72 PM 2.32 (H) 0.61 - 1.24 mg/dL Final  97/78/7975 93:49 AM 2.36 (H) 0.61 - 1.24 mg/dL Final  97/78/7975 98:67 AM 2.36 (H) 0.61 -  1.24 mg/dL Final  97/79/7975 95:99 PM 2.23 (H) 0.61 - 1.24 mg/dL Final  97/79/7975 98:70 AM 2.43 (H) 0.61 - 1.24 mg/dL Final  97/80/7975 92:81 PM 2.43 (H) 0.61 - 1.24 mg/dL Final  89/83/7976 91:50 AM 2.17 (H) 0.76 - 1.27 mg/dL Final  90/78/7976 96:75 PM 2.14 (H) 0.76 - 1.27 mg/dL Final  93/96/7976 90:64 AM 2.90 (H) 0.61 - 1.24 mg/dL Final  96/91/7976 88:92 AM 2.46 (H) 0.76 - 1.27 mg/dL Final  89/70/7978 90:60 AM 2.26 (H) 0.61 - 1.24 mg/dL Final  89/92/7978 88:55 AM 2.57 (H) 0.76 - 1.27 mg/dL Final  91/87/7978 89:79 AM 2.68 (H) 0.61 - 1.24 mg/dL Final  91/94/7978 90:82 AM 2.71 (H) 0.61 - 1.24 mg/dL Final  92/70/7978 87:77 PM 2.32 (H) 0.61 - 1.24 mg/dL Final  94/74/7978 89:46 AM 2.41 (H) 0.61 - 1.24 mg/dL Final  94/81/7978 95:94 PM 2.41 (H) 0.61 - 1.24 mg/dL Final  94/90/7978 94:57 AM 2.19 (H) 0.61 - 1.24 mg/dL Final  94/91/7978 90:97 PM 2.27 (H) 0.61 - 1.24 mg/dL Final  94/91/7978 95:54 PM 2.11 (H) 0.61 - 1.24 mg/dL Final  94/91/7978 93:62 AM 2.20 (H) 0.61 - 1.24 mg/dL Final  94/92/7978 93:94 PM 2.24 (H) 0.61 - 1.24 mg/dL Final  92/71/7981 91:91 PM 1.59 (H) 0.61 - 1.24 mg/dL Final  95/94/7989 90:42 PM 2.1 (H) 0.4 - 1.5 mg/dL Final  88/79/7990 88:64 AM 1.58 (H)  Final  04/08/2008 01:59 AM 2.3 (H)  Final   Recent Labs  Lab 09/15/24 1846 09/15/24 1913 09/16/24 0250  NA 139 140 141  K 3.8 3.8 3.4*  CL 108 107 111  CO2 18*  --  18*  GLUCOSE 174* 167* 155*  BUN 41* 51* 40*  CREATININE 3.70* 3.90* 3.75*  CALCIUM 8.1*  --  8.2*   Recent Labs  Lab 09/15/24 1846 09/15/24 1913 09/16/24 0250  WBC 10.3  --  10.0  HGB 8.8* 9.2* 8.2*  HCT 27.1* 27.0* 24.6*  MCV 85.8  --  84.2  PLT 280  --  262   Liver Function Tests: Recent Labs  Lab 09/15/24 1846  AST 31  ALT 34  ALKPHOS 82  BILITOT 1.0  PROT 6.3*  ALBUMIN 2.6*   No results for input(s): LIPASE, AMYLASE in the last 168 hours. No results for input(s): AMMONIA in the last 168 hours. Cardiac Enzymes: No results for  input(s): CKTOTAL, CKMB, CKMBINDEX, TROPONINI in the last 168 hours. Iron Studies: No results for input(s): IRON, TIBC, TRANSFERRIN, FERRITIN in the last 72 hours. PT/INR: @LABRCNTIP (inr:5)  Xrays/Other Studies: ) Results for orders placed or performed during the hospital encounter of 09/15/24 (from the past 48 hours)  Basic metabolic panel     Status: Abnormal   Collection Time: 09/15/24  6:46 PM  Result Value Ref Range   Sodium 139 135 - 145 mmol/L   Potassium 3.8 3.5 - 5.1 mmol/L    Comment: HEMOLYSIS AT THIS LEVEL MAY AFFECT  RESULT   Chloride 108 98 - 111 mmol/L   CO2 18 (L) 22 - 32 mmol/L   Glucose, Bld 174 (H) 70 - 99 mg/dL    Comment: Glucose reference range applies only to samples taken after fasting for at least 8 hours.   BUN 41 (H) 8 - 23 mg/dL   Creatinine, Ser 6.29 (H) 0.61 - 1.24 mg/dL   Calcium 8.1 (L) 8.9 - 10.3 mg/dL   GFR, Estimated 17 (L) >60 mL/min    Comment: (NOTE) Calculated using the CKD-EPI Creatinine Equation (2021)    Anion gap 13 5 - 15    Comment: Performed at [facility], [address], [place], [place] [address]  CBC     Status: Abnormal   Collection Time: 09/15/24  6:46 PM  Result Value Ref Range   WBC 10.3 4.0 - 10.5 K/uL   RBC 3.16 (L) 4.22 - 5.81 MIL/uL   Hemoglobin 8.8 (L) 13.0 - 17.0 g/dL   HCT 72.8 (L) 60.9 - 47.9 %   MCV 85.8 80.0 - 100.0 fL   MCH 27.8 26.0 - 34.0 pg   MCHC 32.5 30.0 - 36.0 g/dL   RDW 84.1 (H) 88.4 - 84.4 %   Platelets 280 150 - 400 K/uL   nRBC 0.0 0.0 - 0.2 %    Comment: Performed at [facility], [address], [place], [place] [address]  BNP (Order if Patient has history of Heart Failure)     Status: Abnormal   Collection Time: 09/15/24  6:46 PM  Result Value Ref Range   B Natriuretic Peptide 728.9 (H) 0.0 - 100.0 pg/mL    Comment: Performed at [facility], [address], [place], [place] [address]  Hepatic function panel     Status: Abnormal   Collection Time: 09/15/24   6:46 PM  Result Value Ref Range   Total Protein 6.3 (L) 6.5 - 8.1 g/dL   Albumin 2.6 (L) 3.5 - 5.0 g/dL   AST 31 15 - 41 U/L    Comment: HEMOLYSIS AT THIS LEVEL MAY AFFECT RESULT   ALT 34 0 - 44 U/L    Comment: HEMOLYSIS AT THIS LEVEL MAY AFFECT RESULT   Alkaline Phosphatase 82 38 - 126 U/L   Total Bilirubin 1.0 0.0 - 1.2 mg/dL    Comment: HEMOLYSIS AT THIS LEVEL MAY AFFECT RESULT   Bilirubin, Direct 0.3 (H) 0.0 - 0.2 mg/dL   Indirect Bilirubin 0.7 0.3 - 0.9 mg/dL    Comment: Performed at [facility], [address], [place], [place] [address]  I-stat chem 8, ED (not at [facility], [facility] or [facility])     Status: Abnormal   Collection Time: 09/15/24  7:13 PM  Result Value Ref Range   Sodium 140 135 - 145 mmol/L   Potassium 3.8 3.5 - 5.1 mmol/L   Chloride 107 98 - 111 mmol/L   BUN 51 (H) 8 - 23 mg/dL   Creatinine, Ser 6.09 (H) 0.61 - 1.24 mg/dL   Glucose, Bld 832 (H) 70 - 99 mg/dL    Comment: Glucose reference range applies only to samples taken after fasting for at least 8 hours.   Calcium, Ion 1.13 (L) 1.15 - 1.40 mmol/L   TCO2 22 22 - 32 mmol/L   Hemoglobin 9.2 (L) 13.0 - 17.0 g/dL   HCT 72.9 (L) 60.9 - 47.9 %  CBG monitoring, ED     Status: None   Collection Time: 09/15/24 10:42  PM  Result Value Ref Range   Glucose-Capillary 78 70 - 99 mg/dL    Comment: Glucose reference range applies only to samples taken after fasting for at least 8 hours.  Urinalysis, Routine w reflex microscopic -Urine, Clean Catch     Status: Abnormal   Collection Time: 09/15/24 10:50 PM  Result Value Ref Range   Color, Urine YELLOW YELLOW   APPearance HAZY (A) CLEAR   Specific Gravity, Urine 1.009 1.005 - 1.030   pH 5.0 5.0 - 8.0   Glucose, UA NEGATIVE NEGATIVE mg/dL   Hgb urine dipstick NEGATIVE NEGATIVE   Bilirubin Urine NEGATIVE NEGATIVE   Ketones, ur NEGATIVE NEGATIVE mg/dL   Protein, ur 899 (A) NEGATIVE mg/dL   Nitrite NEGATIVE NEGATIVE   Leukocytes,Ua NEGATIVE NEGATIVE   RBC / HPF 0-5 0 - 5  RBC/hpf   WBC, UA 0-5 0 - 5 WBC/hpf   Bacteria, UA NONE SEEN NONE SEEN   Squamous Epithelial / HPF 0-5 0 - 5 /HPF   Hyaline Casts, UA PRESENT     Comment: Performed at [facility], [address] [place], [place] [address]  Creatinine, urine, random     Status: None   Collection Time: 09/15/24 10:50 PM  Result Value Ref Range   Creatinine, Urine 93 mg/dL    Comment: Performed at [facility], [address] [place], [place] [address]  Sodium, urine, random     Status: None   Collection Time: 09/15/24 10:50 PM  Result Value Ref Range   Sodium, Ur 45 mmol/L    Comment: Performed at [facility], [address], [place], [place] [address]  Magnesium      Status: None   Collection Time: 09/16/24  2:50 AM  Result Value Ref Range   Magnesium  1.8 1.7 - 2.4 mg/dL    Comment: Performed at [facility], [address], [place], [place] [address]  CBC     Status: Abnormal   Collection Time: 09/16/24  2:50 AM  Result Value Ref Range   WBC 10.0 4.0 - 10.5 K/uL   RBC 2.92 (L) 4.22 - 5.81 MIL/uL   Hemoglobin 8.2 (L) 13.0 - 17.0 g/dL   HCT 75.3 (L) 60.9 - 47.9 %   MCV 84.2 80.0 - 100.0 fL   MCH 28.1 26.0 - 34.0 pg   MCHC 33.3 30.0 - 36.0 g/dL   RDW 84.2 (H) 88.4 - 84.4 %   Platelets 262 150 - 400 K/uL   nRBC 0.0 0.0 - 0.2 %    Comment: Performed at [facility], [address] [place], [place] [address]  Basic metabolic panel     Status: Abnormal   Collection Time: 09/16/24  2:50 AM  Result Value Ref Range   Sodium 141 135 - 145 mmol/L   Potassium 3.4 (L) 3.5 - 5.1 mmol/L   Chloride 111 98 - 111 mmol/L   CO2 18 (L) 22 - 32 mmol/L   Glucose, Bld 155 (H) 70 - 99 mg/dL    Comment: Glucose reference range applies only to samples taken after fasting for at least 8 hours.   BUN 40 (H) 8 - 23 mg/dL   Creatinine, Ser 6.24 (H) 0.61 - 1.24 mg/dL   Calcium 8.2 (L) 8.9 - 10.3 mg/dL   GFR, Estimated 17 (L) >60 mL/min    Comment: (NOTE) Calculated using the CKD-EPI  Creatinine Equation (2021)    Anion gap 12 5 - 15  Comment: Performed at [facility], [address] [place], [place] [address]  TSH     Status: Abnormal   Collection Time: 09/16/24  2:50 AM  Result Value Ref Range   TSH 7.823 (H) 0.350 - 4.500 uIU/mL    Comment: Performed by a 3rd Generation assay with a functional sensitivity of <=0.01 uIU/mL. Performed at [facility], [address] [place], [place] [address]   CBG monitoring, ED     Status: None   Collection Time: 09/16/24  7:49 AM  Result Value Ref Range   Glucose-Capillary 83 70 - 99 mg/dL    Comment: Glucose reference range applies only to samples taken after fasting for at least 8 hours.  CBG monitoring, ED     Status: Abnormal   Collection Time: 09/16/24 11:02 AM  Result Value Ref Range   Glucose-Capillary 112 (H) 70 - 99 mg/dL    Comment: Glucose reference range applies only to samples taken after fasting for at least 8 hours.   US  RENAL Result Date: 09/16/2024 EXAM: US  Retroperitoneum Complete, Renal. CLINICAL HISTORY: [id number] Acute kidney injury superimposed on chronic kidney disease 8540519. Acute kidney injury superimposed on chronic kidney disease 8540519. TECHNIQUE: Real-time ultrasound of the retroperitoneum (complete) with image documentation. COMPARISON: CT abdomen and pelvis 10/24/2006. FINDINGS: RIGHT KIDNEY: Measures 8.5 x 5.3 x 5.3 cm, volume 126 ml. There is increased echogenicity. No hydronephrosis, renal stone, or mass visualized. LEFT KIDNEY: Measures 9.3 x 5.9 x 4.7 cm, volume 135 ml. There is increased echogenicity. No hydronephrosis or renal stone visualized. Cannot exclude focal mid left renal mass measuring 3.8 x 3.4 cm. BLADDER: The bladder is not seen. IMPRESSION: 1. Increased echogenicity in both kidneys, consistent with chronic kidney disease. 2. Cannot exclude focal mid left renal mass measuring 3.8 x 3.4 cm. Recommend further evaluation with renal CT or MRI. Electronically signed by: Greig Pique MD 09/16/2024 12:35 AM EDT RP Workstation: [id number]   DG Chest 2 View Result Date: 09/15/2024 EXAM: 2 VIEW(S) XRAY OF THE CHEST 09/15/2024 07:53:05 PM COMPARISON: Chest x-ray 01/07/2023. CLINICAL HISTORY: shob. Recently DC for nec fasc in groin. Pt was taken off of diuretic. Since stopping, has had increasing SHOB. No CP. Distended abdomen. Weighed 275 upon admission to hospital. Today pt is 302lb. SHOB increases with exertion. Bp 164/90 spo2 94% ra hr 60 cbg 264. FINDINGS: LUNGS AND PLEURA: No focal pulmonary opacity. No pneumothorax. Cardiomegaly with vascular congestion and mild edema, small pleural effusions, and streaky atelectasis at the bases HEART AND MEDIASTINUM: Cardiomegaly with central vascular congestion. BONES AND SOFT TISSUES: No acute osseous abnormality. IMPRESSION: 1. Cardiomegaly with pulmonary vascular congestion and mild interstitial edema. 2. Small bilateral pleural effusions. 3. Bibasilar atelectasis. Electronically signed by: Luke Bun MD 09/15/2024 08:03 PM EDT RP Workstation: [id number]    PMH:   Past Medical History:  Diagnosis Date   Acanthosis nigricans    Atopic dermatitis    CHF (congestive heart failure) (HCC)    CKD (chronic kidney disease) stage 4, GFR 15-29 ml/min (HCC) 01/08/2023   Diabetes (HCC) 11/19/2002   Erectile dysfunction    Fatty liver 11/19/2005   Glaucoma 11/19/2006   Gynecomastia 11/20/2007   Hx of adenomatous colonic polyps 08/26/2023   Hyperaldosteronism 11/19/1998   Hypercholesterolemia    Hyperlipidemia 2010   Hypertension 1999   Hypoglycemic reaction    Hypothyroidism 11/19/2002   Incontinence    Intermittent vertigo 11/19/2010   Left cervical radiculopathy 11/19/2010   Lumbar radiculopathy  Obesity    Peripheral neuropathy    Pollen allergies 11/19/2005   perennial   Prostate cancer (HCC) 11/19/2004   Reflux esophagitis 11/19/1993   Sickle cell trait 11/19/2004   Sleep apnea, obstructive 11/19/1998   uses a  cpap   Venous insufficiency 11/20/2003   Vitamin D deficiency 11/19/2010    PSH:   Past Surgical History:  Procedure Laterality Date   COLONOSCOPY  2006   normal   INCISION AND DRAINAGE OF WOUND N/A 08/28/2024   Procedure: IRRIGATION AND EXCISIONAL DEBRIDEMENT WOUND;  Surgeon: Lyndel Deward PARAS, MD;  Location: [facility] OR;  Service: General;  Laterality: N/A;  EXCISIONAL DEBRIDEMENT   INCISION AND DRAINAGE PERIRECTAL ABSCESS N/A 08/30/2024   Procedure: INCISION AND DRAINAGE OF PERINEAL WOUND;  Surgeon: Polly Cordella LABOR, MD;  Location: [facility] OR;  Service: General;  Laterality: N/A;   lap band surgery  2009   left inguinal hernia repair  1998   LIPOMA EXCISION Left 04/17/2023   Procedure: MINOR EXCISION LEFT BUTTOCK SEBACEOUS CYST;  Surgeon: Lyndel Deward PARAS, MD;  Location: [facility];  Service: General;  Laterality: Left;   robotic prostatectomy  2008    Allergies:  Allergies  Allergen Reactions   Ace Inhibitors Cough   Maxidex [Dexamethasone] Other (See Comments)    Sexual dysfunction   Verapamil Other (See Comments)    constipation    Medications:   Prior to Admission medications   Medication Sig Start Date End Date Taking? Authorizing Provider  acetaminophen  (TYLENOL ) 500 MG tablet Take 500 mg by mouth every 6 (six) hours as needed for mild pain.   Yes [provider]  amLODipine  (NORVASC ) 5 MG tablet Take 1 tablet (5 mg total) by mouth daily. 09/04/24 10/04/24 Yes Perri LABOR Meliton Mickey., MD  carvedilol  (COREG ) 25 MG tablet Take 1 tablet (25 mg total) by mouth 2 (two) times daily with food 04/01/24  Yes   Continuous Glucose Sensor (FREESTYLE LIBRE 3 PLUS SENSOR) MISC Apply to upper back of arm. Change every 15 days. 04/03/24  Yes   Continuous Glucose Sensor (FREESTYLE LIBRE 3 SENSOR) MISC Apply to upper back of arm and change ever 14 days. 01/28/24  Yes Faythe Purchase, MD  dorzolamide (TRUSOPT) 2 % ophthalmic solution Place 1 drop into both eyes daily.    Yes [provider]  hydrALAZINE  (APRESOLINE ) 100 MG tablet Take 1/2 tablet (50 mg total) by mouth 2 (two) times daily. 08/30/23  Yes Clegg, Amy D, NP  insulin  glargine (LANTUS  SOLOSTAR) 100 UNIT/ML Solostar Pen Inject 15 Units into the skin daily. Follow up with your PCP outpatient for further adjustment to your regimen 09/03/24  Yes Perri LABOR Meliton Mickey., MD  insulin  lispro (HUMALOG  KWIKPEN) 200 UNIT/ML KwikPen Inject 0-15 Units into the skin 3 (three) times daily with meals. CBG < 70: treat low blood sugar CBG 70 - 120: 0 units  CBG 121 - 150: 2 units  CBG 151 - 200: 3 units  CBG 201 - 250: 5 units  CBG 251 - 300: 8 units  CBG 301 - 350: 11 units  CBG 351 - 400: 15 units  CBG >400: Call MD 09/03/24  Yes Perri LABOR Meliton Mickey., MD  levothyroxine  (SYNTHROID ) 125 MCG tablet Take 1 tablet (125 mcg total) by mouth every morning on an empty stomach 06/08/24  Yes   simvastatin  (ZOCOR ) 20 MG tablet Take 1 tablet (20 mg total) by mouth every evening. 07/09/24  Yes   timolol  (BETIMOL ) 0.5 % ophthalmic  solution Place 1 drop into both eyes daily.   Yes [provider]  tirzepatide  (MOUNJARO ) 15 MG/0.5ML Pen Inject 15 mg into the skin once a week. 04/14/24  Yes   torsemide  (DEMADEX ) 20 MG tablet Take 3 tablets (60 mg total) by mouth daily. Patient taking differently: Take 40 mg by mouth daily. 08/08/23 09/15/25 Yes Milford, Harlene HERO, FNP  clobetasol  ointment (TEMOVATE ) 0.05 % Apply topically daily before sleeping. Patient not taking: Reported on 08/29/2024 05/19/24     Finerenone  (KERENDIA ) 20 MG TABS Take 20 mg by mouth daily. Patient not taking: Reported on 09/15/2024    [provider]  Insulin  Pen Needle (TECHLITE PEN NEEDLES) 32G X 4 MM MISC Use to inject insulin  4 times daily 10/25/22   Faythe Purchase, MD  Insulin  Pen Needle 32G X 4 MM MISC Use to inject insulin  up to 4 times daily. 12/26/23   Faythe Purchase, MD  potassium chloride  SA (KLOR-CON  M) 20 MEQ tablet Take 3 tablets  (60 mEq total) by mouth daily. Patient not taking: Reported on 09/15/2024 03/10/24   Glena Harlene HERO, FNP  sacubitril -valsartan  (ENTRESTO ) 49-51 MG Take 1 tablet by mouth 2 (two) times daily. Patient not taking: Reported on 09/15/2024 08/25/24   Bensimhon, Toribio SAUNDERS, MD    Discontinued Meds:   Medications Discontinued During This Encounter  Medication Reason   albuterol  (VENTOLIN  HFA) 108 (90 Base) MCG/ACT inhaler 2 puff    insulin  glargine-yfgn (SEMGLEE ) injection 6 Units    timolol  (BETIMOL ) 0.5 % ophthalmic solution 1 drop    potassium chloride  (KLOR-CON  M) CR tablet 10 mEq     Social History:  reports that he has never smoked. He has never used smokeless tobacco. He reports current alcohol use. He reports that he does not use drugs.  Family History:   Family History  Problem Relation Age of Onset   Heart failure Mother    Hypertension Father        deceased age 3   Heart attack Father    COPD Sister    CVA Sister    Diabetes Daughter    Colon cancer Neg Hx    Stomach cancer Neg Hx    Colon polyps Neg Hx    Esophageal cancer Neg Hx    Rectal cancer Neg Hx     Blood pressure 139/83, pulse (!) 59, temperature 98 F (36.7 C), temperature source Oral, resp. rate 20, height 6' 2 (1.88 m), weight (!) 138.8 kg, SpO2 97%. GEN: NAD, A&Ox3, NCAT, obese HEENT: No conjunctival pallor, EOMI NECK: Supple, no thyromegaly LUNGS: CTA B/L no rales, rhonchi or wheezing CV: RRR, No M/R/G ABD: SNDNT +BS  EXT: 1+ lower extremity edema GU: male purewick        MELIA LYNWOOD ORN, MD 09/16/2024, 11:07 AM

## 2024-09-16 NOTE — Progress Notes (Signed)
 Heart Failure Navigator Progress Note  Assessed for Heart & Vascular TOC clinic readiness.  Patient does not meet criteria due to he is a Advanced Heart Failure Team patient of Dr. Bensimhon. .   Navigator will sign off at this time.   Stephane Haddock, BSN, Scientist, Clinical (histocompatibility And Immunogenetics) Only

## 2024-09-16 NOTE — Progress Notes (Signed)
 PROGRESS NOTE    Brendan [name].  FMW:[id number] DOB: 1956/09/29 DOA: 09/15/2024 PCP: Rexanne Ingle, MD   68/M with chronic diastolic CHF, CKD 4, T2DM, HTN, HLD, hypothyroidism, prostate cancer, anemia of chronic disease, OSA on CPAP who presented to the ED for evaluation of shortness of breath and weight gain. -Recently admitted 10/10-10/16 for sepsis due to necrotizing fasciitis of the scrotum/perineum, emergently taken to the OR for surgical debridement on 10/10.  He underwent I&D again on 10/12.  Wound culture from 10/10 with strep constellatus, MSSA, Prevotella bivia, beta-lactamase positive.  Patient was followed by ID and was treated with IV Unasyn in hospital and discharged on total 2-week course of Augmentin .  Also complicated by fluctuation in his kidney function, his diuretics, Entresto  and Farxiga  were discontinued at discharge.  He noticed progressive dyspnea on exertion and 18 pound weight gain over the last few weeks, so Dr. Marlee with [facility] on Thursday was restarted on diuretics  - His wife currently does wound care and no changes noted there  - In the ED hypertensive, labs with sodium 139, creatinine 3.7, BNP 728, chest x-ray with cardiomegaly pulmonary vascular congestion interstitial edema and pleural effusions   Subjective: -Breathing a little better, still not back to baseline, has fair bit of swelling  Assessment and Plan:  Acute on chronic HFpEF: - Admitted with worsening edema, dyspnea and 18 pound weight gain  - Last echo 6/25 noted EF of 60-65%, grade 1 DD, normal RV  - Continue Lasix  80 mg twice daily, Coreg   - Will request nephrology input, GDMT limited by CKD   AKI on CKD stage IV, ?progression to stage V Prior to recent admission his baseline creatinine was ~3.0.   - Last admission creatinine 3.5-3.8 range, discharged home off GDMT and diuretics  - Now with volume overload, Lasix  as noted above, holding Entresto  and finerenone   -Will  request nephrology input   Recent admission for NSTI of scrotum/perineum: Wound appears stable without any sign of superimposed infection.  He has completed his course of Augmentin .   -consult wound care for ongoing wound management.   Hypertension: Continue Coreg , amlodipine , hydralazine .  Holding Entresto .   Type 2 diabetes: Placed on SSI and Semglee  6 units daily.  Hemoglobin A1c 8.4% on 08/29/2024.   Hypothyroidism: Continue Synthroid .  Follow-up TSH   Hyperlipidemia: Continue simvastatin .   Anemia of chronic disease: Hemoglobin 8.8 on admission, slightly decreased from baseline ~10.  No obvious bleeding,, likely related to CKD and recent admission, blood loss - Check anemia panel   OSA: Continue CPAP nightly.  His spouse will bring his home machine in.   DVT prophylaxis: heparin  injection 5,000 Units Start: 09/15/24 2200 Code Status: Full code Family Communication: d/w patient in detail, no family at bedside Disposition Plan: Home ending improvement in volume status  Consultants:    Procedures:   Antimicrobials:    Objective: Vitals:   09/16/24 0600 09/16/24 0615 09/16/24 0731 09/16/24 1030  BP: (!) 141/79 134/89 121/85 139/83  Pulse: (!) 58 64 71 (!) 59  Resp:  15 18 20   Temp:   98 F (36.7 C)   TempSrc:   Oral   SpO2: 96% 96% 99% 97%  Weight:      Height:        Intake/Output Summary (Last 24 hours) at 09/16/2024 1036 Last data filed at 09/16/2024 0617 Gross per 24 hour  Intake --  Output 150 ml  Net -150 ml   [employer]  09/15/24 1837  Weight: (!) 138.8 kg    Examination:  General exam: Appears calm and comfortable, AAO x 3 Respiratory system: Distant breath sounds, decreased at the bases Cardiovascular system: S1 & S2 heard, RRR.  Abd: nondistended, soft and nontender.Normal bowel sounds heard. GU: Perineal wound with dressing Central nervous system: Alert and oriented. No focal neurological deficits. Extremities: 2+ edema Skin: No  rashes Psychiatry:  Mood & affect appropriate.     Data Reviewed:   CBC: Recent Labs  Lab 09/15/24 1846 09/15/24 1913 09/16/24 0250  WBC 10.3  --  10.0  HGB 8.8* 9.2* 8.2*  HCT 27.1* 27.0* 24.6*  MCV 85.8  --  84.2  PLT 280  --  262   Basic Metabolic Panel: Recent Labs  Lab 09/15/24 1846 09/15/24 1913 09/16/24 0250  NA 139 140 141  K 3.8 3.8 3.4*  CL 108 107 111  CO2 18*  --  18*  GLUCOSE 174* 167* 155*  BUN 41* 51* 40*  CREATININE 3.70* 3.90* 3.75*  CALCIUM 8.1*  --  8.2*  MG  --   --  1.8   GFR: Estimated Creatinine Clearance: 27.6 mL/min (A) (by C-G formula based on SCr of 3.75 mg/dL (H)). Liver Function Tests: Recent Labs  Lab 09/15/24 1846  AST 31  ALT 34  ALKPHOS 82  BILITOT 1.0  PROT 6.3*  ALBUMIN 2.6*   No results for input(s): LIPASE, AMYLASE in the last 168 hours. No results for input(s): AMMONIA in the last 168 hours. Coagulation Profile: No results for input(s): INR, PROTIME in the last 168 hours. Cardiac Enzymes: No results for input(s): CKTOTAL, CKMB, CKMBINDEX, TROPONINI in the last 168 hours. BNP (last 3 results) Recent Labs    08/28/24 1613  PROBNP 6,038.0*   HbA1C: No results for input(s): HGBA1C in the last 72 hours. CBG: Recent Labs  Lab 09/15/24 2242 09/16/24 0749  GLUCAP 78 83   Lipid Profile: No results for input(s): CHOL, HDL, LDLCALC, TRIG, CHOLHDL, LDLDIRECT in the last 72 hours. Thyroid  Function Tests: Recent Labs    09/16/24 0250  TSH 7.823*   Anemia Panel: No results for input(s): VITAMINB12, FOLATE, FERRITIN, TIBC, IRON, RETICCTPCT in the last 72 hours. Urine analysis:    Component Value Date/Time   COLORURINE YELLOW 09/15/2024 2250   APPEARANCEUR HAZY (A) 09/15/2024 2250   LABSPEC 1.009 09/15/2024 2250   PHURINE 5.0 09/15/2024 2250   GLUCOSEU NEGATIVE 09/15/2024 2250   HGBUR NEGATIVE 09/15/2024 2250   BILIRUBINUR NEGATIVE 09/15/2024 2250   KETONESUR  NEGATIVE 09/15/2024 2250   PROTEINUR 100 (A) 09/15/2024 2250   UROBILINOGEN 0.2 07/16/2007 0900   NITRITE NEGATIVE 09/15/2024 2250   LEUKOCYTESUR NEGATIVE 09/15/2024 2250   Sepsis Labs: @LABRCNTIP (procalcitonin:4,lacticidven:4)  )No results found for this or any previous visit (from the past 240 hours).   Radiology Studies: US  RENAL Result Date: 09/16/2024 EXAM: US  Retroperitoneum Complete, Renal. CLINICAL HISTORY: [id number] Acute kidney injury superimposed on chronic kidney disease 8540519. Acute kidney injury superimposed on chronic kidney disease 8540519. TECHNIQUE: Real-time ultrasound of the retroperitoneum (complete) with image documentation. COMPARISON: CT abdomen and pelvis 10/24/2006. FINDINGS: RIGHT KIDNEY: Measures 8.5 x 5.3 x 5.3 cm, volume 126 ml. There is increased echogenicity. No hydronephrosis, renal stone, or mass visualized. LEFT KIDNEY: Measures 9.3 x 5.9 x 4.7 cm, volume 135 ml. There is increased echogenicity. No hydronephrosis or renal stone visualized. Cannot exclude focal mid left renal mass measuring 3.8 x 3.4 cm. BLADDER: The bladder is not seen. IMPRESSION: 1.  Increased echogenicity in both kidneys, consistent with chronic kidney disease. 2. Cannot exclude focal mid left renal mass measuring 3.8 x 3.4 cm. Recommend further evaluation with renal CT or MRI. Electronically signed by: Greig Pique MD 09/16/2024 12:35 AM EDT RP Workstation: [id number]   DG Chest 2 View Result Date: 09/15/2024 EXAM: 2 VIEW(S) XRAY OF THE CHEST 09/15/2024 07:53:05 PM COMPARISON: Chest x-ray 01/07/2023. CLINICAL HISTORY: shob. Recently DC for nec fasc in groin. Pt was taken off of diuretic. Since stopping, has had increasing SHOB. No CP. Distended abdomen. Weighed 275 upon admission to hospital. Today pt is 302lb. SHOB increases with exertion. Bp 164/90 spo2 94% ra hr 60 cbg 264. FINDINGS: LUNGS AND PLEURA: No focal pulmonary opacity. No pneumothorax. Cardiomegaly with vascular congestion and mild  edema, small pleural effusions, and streaky atelectasis at the bases HEART AND MEDIASTINUM: Cardiomegaly with central vascular congestion. BONES AND SOFT TISSUES: No acute osseous abnormality. IMPRESSION: 1. Cardiomegaly with pulmonary vascular congestion and mild interstitial edema. 2. Small bilateral pleural effusions. 3. Bibasilar atelectasis. Electronically signed by: Luke Bun MD 09/15/2024 08:03 PM EDT RP Workstation: [id number]     Scheduled Meds:  amLODipine   5 mg Oral Daily   carvedilol   25 mg Oral BID WC   dorzolamide  1 drop Both Eyes QHS   furosemide   80 mg Intravenous Q12H   heparin   5,000 Units Subcutaneous Q8H   hydrALAZINE   50 mg Oral BID   insulin  aspart  0-5 Units Subcutaneous QHS   insulin  aspart  0-9 Units Subcutaneous TID WC   insulin  glargine-yfgn  6 Units Subcutaneous Daily   levothyroxine   125 mcg Oral Q0600   potassium chloride   40 mEq Oral Once   simvastatin   20 mg Oral QPM   sodium chloride  flush  3 mL Intravenous Q12H   timolol   1 drop Both Eyes Daily   Continuous Infusions:   LOS: 1 day    Time spent:    Sigurd Pac, MD [facility]   09/16/2024, 10:36 AM

## 2024-09-16 NOTE — Consult Note (Signed)
 WOC Nurse Consult Note: Reason for Consult: Postsurgical perineum/scrotal wound  Wound type: full thickness wound to posterior scrotum, s/p surgical debridement for necrotizing fascitis on 10/10 and again on 10/12 Pressure Injury POA: NA Measurement: 14 cm x 3 cm x 6 cm Wound bed: 80 % pink, moist 20 % yellow slough Drainage (amount, consistency, odor) yellow/ purulent Periwound: intact Dressing procedure/placement/frequency:  Cleanse with Vashe (lawson # [id number]), pack wound with Vashe soaked gauze, use cotton tipped applicator to advance gauze to full depth of wound.  Cover with dry gauze and ABD pad and tape.  Change daily and PRN soiling.    WOC team will not follow patient at this time, please re consult if new needs arise.  Thank you,  Doyal Polite, MSN, RN, [facility] WOC Team [phone] (Available Mon-Fri 0700-1500)

## 2024-09-16 NOTE — Progress Notes (Signed)
 Patient arrived to unit on 10 L O2 via a nebulizer face mask, lowered O2 to 6L with no change in saturation. Patient took mask off to eat while sating 93%. Switched out mask for [place] at 4L with continued no decrease.

## 2024-09-16 NOTE — ED Notes (Signed)
 Patient transported to Ultrasound

## 2024-09-17 DIAGNOSIS — I5033 Acute on chronic diastolic (congestive) heart failure: Secondary | ICD-10-CM | POA: Diagnosis not present

## 2024-09-17 LAB — IRON AND TIBC
Iron: 39 ug/dL — ABNORMAL LOW (ref 45–182)
Saturation Ratios: 21 % (ref 17.9–39.5)
TIBC: 190 ug/dL — ABNORMAL LOW (ref 250–450)
UIBC: 151 ug/dL

## 2024-09-17 LAB — GLUCOSE, CAPILLARY
Glucose-Capillary: 243 mg/dL — ABNORMAL HIGH (ref 70–99)
Glucose-Capillary: 278 mg/dL — ABNORMAL HIGH (ref 70–99)
Glucose-Capillary: 174 mg/dL — ABNORMAL HIGH (ref 70–99)
Glucose-Capillary: 175 mg/dL — ABNORMAL HIGH (ref 70–99)

## 2024-09-17 LAB — UREA NITROGEN, URINE: Urea Nitrogen, Ur: 270 mg/dL

## 2024-09-17 LAB — RETICULOCYTES
Immature Retic Fract: 8.9 % (ref 2.3–15.9)
RBC.: 3.1 MIL/uL — ABNORMAL LOW (ref 4.22–5.81)
Retic Count, Absolute: 71.9 K/uL (ref 19.0–186.0)
Retic Ct Pct: 2.3 % (ref 0.4–3.1)

## 2024-09-17 LAB — BASIC METABOLIC PANEL WITH GFR
Anion gap: 14 (ref 5–15)
BUN: 43 mg/dL — ABNORMAL HIGH (ref 8–23)
CO2: 19 mmol/L — ABNORMAL LOW (ref 22–32)
Calcium: 8.2 mg/dL — ABNORMAL LOW (ref 8.9–10.3)
Chloride: 108 mmol/L (ref 98–111)
Creatinine, Ser: 3.98 mg/dL — ABNORMAL HIGH (ref 0.61–1.24)
GFR, Estimated: 16 mL/min — ABNORMAL LOW (ref >60)
Glucose, Bld: 283 mg/dL — ABNORMAL HIGH (ref 70–99)
Potassium: 3.6 mmol/L (ref 3.5–5.1)
Sodium: 141 mmol/L (ref 135–145)

## 2024-09-17 LAB — CBC
HCT: 25.7 % — ABNORMAL LOW (ref 39.0–52.0)
Hemoglobin: 8.8 g/dL — ABNORMAL LOW (ref 13.0–17.0)
MCH: 28.2 pg (ref 26.0–34.0)
MCHC: 34.2 g/dL (ref 30.0–36.0)
MCV: 82.4 fL (ref 80.0–100.0)
Platelets: 284 K/uL (ref 150–400)
RBC: 3.12 MIL/uL — ABNORMAL LOW (ref 4.22–5.81)
RDW: 15.9 % — ABNORMAL HIGH (ref 11.5–15.5)
WBC: 8.7 K/uL (ref 4.0–10.5)
nRBC: 0 % (ref 0.0–0.2)

## 2024-09-17 LAB — FERRITIN: Ferritin: 40 ng/mL (ref 24–336)

## 2024-09-17 LAB — FOLATE: Folate: 5.3 ng/mL — ABNORMAL LOW (ref >5.9)

## 2024-09-17 LAB — VITAMIN B12: Vitamin B-12: 443 pg/mL (ref 180–914)

## 2024-09-17 MED ORDER — METOLAZONE 5 MG PO TABS
5.0000 mg | ORAL_TABLET | Freq: Once | ORAL | Status: AC
  Administered 2024-09-17: 5 mg via ORAL
  Filled 2024-09-17: qty 1

## 2024-09-17 MED ORDER — DORZOLAMIDE HCL 2 % OP SOLN
1.0000 [drp] | Freq: Every day | OPHTHALMIC | Status: DC
  Administered 2024-09-17 – 2024-09-22 (×5): 1 [drp] via OPHTHALMIC

## 2024-09-17 MED ORDER — FUROSEMIDE 10 MG/ML IJ SOLN
120.0000 mg | Freq: Three times a day (TID) | INTRAVENOUS | Status: DC
  Administered 2024-09-17 – 2024-09-18 (×3): 120 mg via INTRAVENOUS
  Filled 2024-09-17 (×3): qty 10
  Filled 2024-09-17: qty 12
  Filled 2024-09-17: qty 2
  Filled 2024-09-17: qty 12

## 2024-09-17 NOTE — Inpatient Diabetes Management (Signed)
 Inpatient Diabetes Program Recommendations  AACE/ADA: New Consensus Statement on Inpatient Glycemic Control (2015)  Target Ranges:  Prepandial:   less than 140 mg/dL      Peak postprandial:   less than 180 mg/dL (1-2 hours)      Critically ill patients:  140 - 180 mg/dL   Lab Results  Component Value Date   GLUCAP 243 (H) 09/17/2024   HGBA1C 8.4 (H) 08/29/2024    Review of Glycemic Control  Latest Reference Range & Units 09/16/24 11:02 09/16/24 11:52 09/16/24 13:54 09/16/24 18:23 09/16/24 22:33 09/17/24 06:14  Glucose-Capillary 70 - 99 mg/dL 887 (H) 845 (H) 867 (H) 220 (H) 297 (H) 243 (H)  (H): Data is abnormally high  Diabetes history: DM2 Outpatient Diabetes medications: Lantus  15 units every day, Novolog  0-15 units TID Mounjaro  15 mg weekly, Freestyle Libre3 Current orders for Inpatient glycemic control: Semglee  6 units every day, Novolog  0-9 units TID and 0-5 units QHS  Inpatient Diabetes Program Recommendations:    Might consider increasing basal insulin :  Semglee  10 units every day Novolog  3 units TID with meals if he consumes at least 50%  Thank you, Wyvonna Pinal, MSN, CDCES Diabetes Coordinator Inpatient Diabetes Program [phone] (team pager from 8a-5p)

## 2024-09-17 NOTE — Plan of Care (Signed)

## 2024-09-17 NOTE — Progress Notes (Signed)
 PROGRESS NOTE    Brendan [name].  FMW:[id number] DOB: 22-Feb-1956 DOA: 09/15/2024 PCP: Rexanne Ingle, MD   68/M with chronic diastolic CHF, CKD 4, T2DM, HTN, HLD, hypothyroidism, prostate cancer, anemia of chronic disease, OSA on CPAP who presented to the ED for evaluation of shortness of breath and weight gain. -Recently admitted 10/10-10/16 for sepsis due to necrotizing fasciitis of the scrotum/perineum, emergently taken to the OR for surgical debridement on 10/10.  He underwent I&D again on 10/12.  Wound culture from 10/10 with strep constellatus, MSSA, Prevotella bivia, beta-lactamase positive.  Patient was followed by ID and was treated with IV Unasyn in hospital and discharged on total 2-week course of Augmentin .  Also complicated by fluctuation in his kidney function, his diuretics, Entresto  and Farxiga  were discontinued at discharge.  He noticed progressive dyspnea on exertion and 18 pound weight gain over the last few weeks, so Dr. Marlee with [facility] on Thursday was restarted on diuretics  - His wife currently does wound care and no changes noted there  - In the ED hypertensive, labs with sodium 139, creatinine 3.7, BNP 728, chest x-ray with cardiomegaly pulmonary vascular congestion interstitial edema and pleural effusions   The patient is being diuresed, although it does not seem that he is putting out a lot of fluid. Cardiology is consulted. Creatinine is elevated due to diuresis. Nephrology is consulted as well. Patient may require dialysis.  Subjective: -The patient is resting comfortably. No new complaints. Continues to be quite edematous.  Assessment and Plan:  Acute on chronic HFpEF: - Admitted with worsening edema, dyspnea and 18 pound weight gain  - Last echo 6/25 noted EF of 60-65%, grade 1 DD, normal RV  - Continue Lasix  80 mg twice daily, Coreg   - Will request nephrology input, GDMT limited by CKD   AKI on CKD stage IV, ?progression to stage V Prior  to recent admission his baseline creatinine was ~3.0.   - Last admission creatinine 3.5-3.8 range, discharged home off GDMT and diuretics  - Now with volume overload, Lasix  as noted above, holding Entresto  and finerenone   -Nephrology has been consulted. There is concern that this patient may require dialysis before the end of this inpatient stay.   Recent admission for NSTI of scrotum/perineum: Wound appears stable without any sign of superimposed infection.  He has completed his course of Augmentin .   -consult wound care for ongoing wound management.   Hypertension: Continue Coreg , amlodipine , hydralazine .  Holding Entresto .   Type 2 diabetes: Placed on SSI and Semglee  6 units daily.  Hemoglobin A1c 8.4% on 08/29/2024.   Hypothyroidism: Continue Synthroid .  Follow-up TSH   Hyperlipidemia: Continue simvastatin .   Anemia of chronic disease: Hemoglobin 8.8 on admission, slightly decreased from baseline ~10.  No obvious bleeding,, likely related to CKD and recent admission, blood loss - Check anemia panel   OSA: Continue CPAP nightly.  His spouse will bring his home machine in.   DVT prophylaxis: heparin  injection 5,000 Units Start: 09/15/24 2200 Code Status: Full code Family Communication: d/w patient in detail, no family at bedside Disposition Plan: Home ending improvement in volume status  Consultants:  Cardiology Nephrology  Procedures:   Antimicrobials:    Objective: Vitals:   09/17/24 0906 09/17/24 0932 09/17/24 1200 09/17/24 1732  BP: 136/72 136/72 126/69 (!) 140/94  Pulse: 65  63 62  Resp: 16  18 16   Temp: (!) 97.4 F (36.3 C)  (!) 97.5 F (36.4 C) (!) 97.4 F (36.3 C)  TempSrc: Oral  Oral Oral  SpO2: 100%  97% 96%  Weight:      Height:        Intake/Output Summary (Last 24 hours) at 09/17/2024 1852 Last data filed at 09/17/2024 1850 Gross per 24 hour  Intake 312 ml  Output 1100 ml  Net -788 ml   Filed Weights   09/15/24 1837 09/17/24 0359   Weight: (!) 138.8 kg (!) 142.2 kg    Examination:  Exam:  Constitutional:  The patient is awake, alert, and oriented x 3. No acute distress. Respiratory:  No increased work of breathing. No wheezes, rales, or rhonchi No tactile fremitus Cardiovascular:  Regular rate and rhythm No murmurs, ectopy, or gallups. No lateral PMI. No thrills. Abdomen:  Abdomen is soft, non-tender, non-distended No hernias, masses, or organomegaly Normoactive bowel sounds.  Musculoskeletal:  No cyanosis, clubbing +1 pitting edema on extremities. Skin:  No rashes, lesions, ulcers palpation of skin: no induration or nodules Neurologic:  CN 2-12 intact Sensation all 4 extremities intact Psychiatric:  Mental status Mood, affect appropriate Orientation to person, place, time  judgment and insight appear intact  Data Reviewed:   CBC: Recent Labs  Lab 09/15/24 1846 09/15/24 1913 09/16/24 0250 09/17/24 0322  WBC 10.3  --  10.0 8.7  HGB 8.8* 9.2* 8.2* 8.8*  HCT 27.1* 27.0* 24.6* 25.7*  MCV 85.8  --  84.2 82.4  PLT 280  --  262 284   Basic Metabolic Panel: Recent Labs  Lab 09/15/24 1846 09/15/24 1913 09/16/24 0250 09/17/24 0322  NA 139 140 141 141  K 3.8 3.8 3.4* 3.6  CL 108 107 111 108  CO2 18*  --  18* 19*  GLUCOSE 174* 167* 155* 283*  BUN 41* 51* 40* 43*  CREATININE 3.70* 3.90* 3.75* 3.98*  CALCIUM 8.1*  --  8.2* 8.2*  MG  --   --  1.8  --    GFR: Estimated Creatinine Clearance: 26.3 mL/min (A) (by C-G formula based on SCr of 3.98 mg/dL (H)). Liver Function Tests: Recent Labs  Lab 09/15/24 1846  AST 31  ALT 34  ALKPHOS 82  BILITOT 1.0  PROT 6.3*  ALBUMIN 2.6*   No results for input(s): LIPASE, AMYLASE in the last 168 hours. No results for input(s): AMMONIA in the last 168 hours. Coagulation Profile: No results for input(s): INR, PROTIME in the last 168 hours. Cardiac Enzymes: No results for input(s): CKTOTAL, CKMB, CKMBINDEX, TROPONINI in the  last 168 hours. BNP (last 3 results) Recent Labs    08/28/24 1613  PROBNP 6,038.0*   HbA1C: No results for input(s): HGBA1C in the last 72 hours. CBG: Recent Labs  Lab 09/16/24 1823 09/16/24 2233 09/17/24 0614 09/17/24 1157 09/17/24 1729  GLUCAP 220* 297* 243* 278* 174*   Lipid Profile: No results for input(s): CHOL, HDL, LDLCALC, TRIG, CHOLHDL, LDLDIRECT in the last 72 hours. Thyroid  Function Tests: Recent Labs    09/16/24 0249 09/16/24 0250  TSH  --  7.823*  FREET4 0.81  --    Anemia Panel: Recent Labs    09/17/24 0322  VITAMINB12 443  FOLATE 5.3*  FERRITIN 40  TIBC 190*  IRON 39*  RETICCTPCT 2.3   Urine analysis:    Component Value Date/Time   COLORURINE YELLOW 09/15/2024 2250   APPEARANCEUR HAZY (A) 09/15/2024 2250   LABSPEC 1.009 09/15/2024 2250   PHURINE 5.0 09/15/2024 2250   GLUCOSEU NEGATIVE 09/15/2024 2250   HGBUR NEGATIVE 09/15/2024 2250   BILIRUBINUR NEGATIVE 09/15/2024  2250   KETONESUR NEGATIVE 09/15/2024 2250   PROTEINUR 100 (A) 09/15/2024 2250   UROBILINOGEN 0.2 07/16/2007 0900   NITRITE NEGATIVE 09/15/2024 2250   LEUKOCYTESUR NEGATIVE 09/15/2024 2250   Sepsis Labs: @LABRCNTIP (procalcitonin:4,lacticidven:4)  )No results found for this or any previous visit (from the past 240 hours).   Radiology Studies: US  RENAL Result Date: 09/16/2024 EXAM: US  Retroperitoneum Complete, Renal. CLINICAL HISTORY: 8540519 Acute kidney injury superimposed on chronic kidney disease 8540519. Acute kidney injury superimposed on chronic kidney disease 8540519. TECHNIQUE: Real-time ultrasound of the retroperitoneum (complete) with image documentation. COMPARISON: CT abdomen and pelvis 10/24/2006. FINDINGS: RIGHT KIDNEY: Measures 8.5 x 5.3 x 5.3 cm, volume 126 ml. There is increased echogenicity. No hydronephrosis, renal stone, or mass visualized. LEFT KIDNEY: Measures 9.3 x 5.9 x 4.7 cm, volume 135 ml. There is increased echogenicity. No  hydronephrosis or renal stone visualized. Cannot exclude focal mid left renal mass measuring 3.8 x 3.4 cm. BLADDER: The bladder is not seen. IMPRESSION: 1. Increased echogenicity in both kidneys, consistent with chronic kidney disease. 2. Cannot exclude focal mid left renal mass measuring 3.8 x 3.4 cm. Recommend further evaluation with renal CT or MRI. Electronically signed by: Greig Pique MD 09/16/2024 12:35 AM EDT RP Workstation: [id number]   DG Chest 2 View Result Date: 09/15/2024 EXAM: 2 VIEW(S) XRAY OF THE CHEST 09/15/2024 07:53:05 PM COMPARISON: Chest x-ray 01/07/2023. CLINICAL HISTORY: shob. Recently DC for nec fasc in groin. Pt was taken off of diuretic. Since stopping, has had increasing SHOB. No CP. Distended abdomen. Weighed 275 upon admission to hospital. Today pt is 302lb. SHOB increases with exertion. Bp 164/90 spo2 94% ra hr 60 cbg 264. FINDINGS: LUNGS AND PLEURA: No focal pulmonary opacity. No pneumothorax. Cardiomegaly with vascular congestion and mild edema, small pleural effusions, and streaky atelectasis at the bases HEART AND MEDIASTINUM: Cardiomegaly with central vascular congestion. BONES AND SOFT TISSUES: No acute osseous abnormality. IMPRESSION: 1. Cardiomegaly with pulmonary vascular congestion and mild interstitial edema. 2. Small bilateral pleural effusions. 3. Bibasilar atelectasis. Electronically signed by: Luke Bun MD 09/15/2024 08:03 PM EDT RP Workstation: [id number]     Scheduled Meds:  amLODipine   5 mg Oral Daily   carvedilol   12.5 mg Oral BID WC   dorzolamide  1 drop Both Eyes Daily   heparin   5,000 Units Subcutaneous Q8H   hydrALAZINE   50 mg Oral BID   insulin  aspart  0-5 Units Subcutaneous QHS   insulin  aspart  0-9 Units Subcutaneous TID WC   insulin  glargine-yfgn  6 Units Subcutaneous Daily   levothyroxine   125 mcg Oral Q0600   simvastatin   20 mg Oral QPM   sodium chloride  flush  3 mL Intravenous Q12H   timolol   1 drop Both Eyes Daily   Continuous  Infusions:  furosemide  120 mg (09/17/24 1334)     LOS: 2 days    Time spent:    Krystal Delduca, DO [facility]   09/17/2024, 6:52 PM

## 2024-09-17 NOTE — TOC Initial Note (Signed)
 Transition of Care (TOC) - Initial/Assessment Note    Patient Details  Name: Brendan [name]. MRN: [id number] Date of Birth: [birth year]  Transition of Care [facility]) CM/SW Contact:    Arlana JINNY Nicholaus [place] Phone Number: [phone] 09/17/2024, 2:01 PM  Clinical Narrative:   [facility] CSW met with patient and wife at bedside. Patient stated that he lives with wife. Patient stated that he has current HHPT and nurse aide services with [facility]. Patient stated that he uses a CPAP, cane , and walker. Patient stated that he has a scale at home. Patient stated that he has a PCP. CSW explained that a hospital follow up is normally scheduled closer towards dc. Patient agrees. Patients wife will provide transportation at dc.   [facility] CSW/CM will continue to follow and monitor for dc readiness.                       Patient Goals and CMS Choice            Expected Discharge Plan and Services                                              Prior Living Arrangements/Services                       Activities of Daily Living      Permission Sought/Granted                  Emotional Assessment              Admission diagnosis:  Acute on chronic heart failure with preserved ejection fraction (HFpEF, >= 50%) (HCC) [I50.33] Patient Active Problem List   Diagnosis Date Noted   Acute on chronic heart failure with preserved ejection fraction (HFpEF, >= 50%) (HCC) 09/15/2024   Hyperlipidemia associated with type 2 diabetes mellitus (HCC) 09/15/2024   Anemia of chronic disease 09/15/2024   Sepsis (HCC) 08/29/2024   Necrotizing fasciitis (HCC) 08/28/2024   Hx of adenomatous colonic polyps 08/26/2023   Research study patient 01/09/2023   Acute on chronic congestive heart failure (HCC) 01/09/2023   CKD (chronic kidney disease) stage 4, GFR 15-29 ml/min (HCC) 01/08/2023   Heart failure, systolic, acute (HCC) 03/26/2020   Acute on chronic diastolic CHF (congestive heart  failure) (HCC) 03/25/2020   Hypokalemia 03/25/2020   Leukocytosis 03/25/2020   Renal insufficiency 03/25/2020   Chest pain 03/25/2020   Hypothyroidism 03/25/2020   Gout attack 03/25/2020   Morbid obesity (HCC) 10/28/2015   Internal hemorrhoids with bleeding 07/31/2013   Insulin -requiring or dependent type II diabetes mellitus (HCC) 01/11/2010   Obstructive sleep apnea 01/11/2010   Hypertension associated with diabetes (HCC) 01/11/2010   GLAUCOMA 01/10/2010   Allergic rhinitis 01/10/2010   PROSTATE CANCER, HX OF 01/10/2010   PCP:  Rexanne Ingle, MD Pharmacy:   [facility] [address] [place] [address] Phone: [phone] Fax: [phone]  [facility] [address] [place] [address] Phone: [phone] Fax: [phone]     Social Drivers of Health (SDOH) Social History: SDOH Screenings   Food Insecurity: Patient Unable To Answer (09/16/2024)  Housing: Low Risk  (09/01/2024)  Transportation Needs: No Transportation Needs (09/16/2024)  Utilities: At Risk (09/16/2024)  Financial Resource Strain: Low Risk  (01/08/2023)  Social Connections: Socially Integrated (09/16/2024)  Tobacco Use: Low Risk  (08/30/2024)   SDOH Interventions:     Readmission Risk Interventions    09/01/2024   11:34 AM  Readmission Risk Prevention Plan  Transportation Screening Complete  Home Care Screening Complete  Medication Review (RN CM) Referral to Pharmacy

## 2024-09-17 NOTE — Plan of Care (Signed)
  Problem: Education: Goal: Ability to describe self-care measures that may prevent or decrease complications (Diabetes Survival Skills Education) will improve Outcome: Progressing   Problem: Coping: Goal: Ability to adjust to condition or change in health will improve Outcome: Progressing   Problem: Nutritional: Goal: Maintenance of adequate nutrition will improve Outcome: Progressing   Problem: Skin Integrity: Goal: Risk for impaired skin integrity will decrease Outcome: Progressing   Problem: Tissue Perfusion: Goal: Adequacy of tissue perfusion will improve Outcome: Progressing   Problem: Education: Goal: Knowledge of General Education information will improve Description: Including pain rating scale, medication(s)/side effects and non-pharmacologic comfort measures Outcome: Progressing   Problem: Health Behavior/Discharge Planning: Goal: Ability to manage health-related needs will improve Outcome: Progressing   Problem: Clinical Measurements: Goal: Will remain free from infection Outcome: Progressing   Problem: Nutrition: Goal: Adequate nutrition will be maintained Outcome: Progressing   Problem: Coping: Goal: Level of anxiety will decrease Outcome: Progressing   Problem: Elimination: Goal: Will not experience complications related to urinary retention Outcome: Progressing   Problem: Pain Managment: Goal: General experience of comfort will improve and/or be controlled Outcome: Progressing   Problem: Safety: Goal: Ability to remain free from injury will improve Outcome: Progressing   Problem: Skin Integrity: Goal: Risk for impaired skin integrity will decrease Outcome: Progressing   Problem: Education: Goal: Ability to demonstrate management of disease process will improve Outcome: Progressing Goal: Ability to verbalize understanding of medication therapies will improve Outcome: Progressing   Problem: Cardiac: Goal: Ability to achieve and maintain  adequate cardiopulmonary perfusion will improve Outcome: Progressing

## 2024-09-17 NOTE — Progress Notes (Signed)
 RN paged ortho tech to bring up una boots for pt.

## 2024-09-17 NOTE — Progress Notes (Addendum)
 Advanced Heart Failure Rounding Note  Cardiologist: Dr. Cherrie  Chief Complaint: a/c HFpEF   Patient Profile   68 y.o. male with history of HFpEF, HTN, OSA on CPAP, DM2, hypothyroidism, CKD IV, and obesity admitted w/ a/c HFpEF w/ marked volume overload. Diuresis c/b progressive renal failure.    Subjective:    Only 600 cc in UOP yesterday despite 80 mg IV Lasix  q8H.   Scr 3.75>>3.98  BUN 43 K 3.6   SBPs 130s-160s   Feels SOB, wearing CPAP. About to start breathing treatment.    Objective:   Weight Range: (!) 142.2 kg Body mass index is 40.25 kg/m.   Vital Signs:   Temp:  [97 F (36.1 C)-97.9 F (36.6 C)] 97.4 F (36.3 C) (10/30 0906) Pulse Rate:  [54-74] 65 (10/30 0906) Resp:  [10-25] 16 (10/30 0906) BP: (124-166)/(68-110) 136/72 (10/30 0906) SpO2:  [95 %-100 %] 100 % (10/30 0906) Weight:  [142.2 kg] 142.2 kg (10/30 0359)    Weight change: Filed Weights   09/15/24 1837 09/17/24 0359  Weight: (!) 138.8 kg (!) 142.2 kg    Intake/Output:   Intake/Output Summary (Last 24 hours) at 09/17/2024 0907 Last data filed at 09/17/2024 9374 Gross per 24 hour  Intake --  Output 600 ml  Net -600 ml      Physical Exam    GENERAL: obese, NAD Lungs- diminished at the bases  CARDIAC:  thick neck, JVD to jaw          Normal rate with regular rhythm. 2+ pretibial edema bilaterally  ABDOMEN: obese, soft, non-tender, non-distended.  EXTREMITIES: Warm and well perfused.  NEUROLOGIC: No obvious FND   Telemetry   NSR 60s   EKG    N/A   Labs    CBC Recent Labs    09/16/24 0250 09/17/24 0322  WBC 10.0 8.7  HGB 8.2* 8.8*  HCT 24.6* 25.7*  MCV 84.2 82.4  PLT 262 284   Basic Metabolic Panel Recent Labs    89/70/74 0250 09/17/24 0322  NA 141 141  K 3.4* 3.6  CL 111 108  CO2 18* 19*  GLUCOSE 155* 283*  BUN 40* 43*  CREATININE 3.75* 3.98*  CALCIUM 8.2* 8.2*  MG 1.8  --    Liver Function Tests Recent Labs    09/15/24 1846  AST 31  ALT  34  ALKPHOS 82  BILITOT 1.0  PROT 6.3*  ALBUMIN 2.6*   No results for input(s): LIPASE, AMYLASE in the last 72 hours. Cardiac Enzymes No results for input(s): CKTOTAL, CKMB, CKMBINDEX, TROPONINI in the last 72 hours.  BNP: BNP (last 3 results) Recent Labs    01/20/24 1042 09/15/24 1846  BNP 295.6* 728.9*    ProBNP (last 3 results) Recent Labs    08/28/24 1613  PROBNP 6,038.0*     D-Dimer No results for input(s): DDIMER in the last 72 hours. Hemoglobin A1C No results for input(s): HGBA1C in the last 72 hours. Fasting Lipid Panel No results for input(s): CHOL, HDL, LDLCALC, TRIG, CHOLHDL, LDLDIRECT in the last 72 hours. Thyroid  Function Tests Recent Labs    09/16/24 0250  TSH 7.823*    Other results:   Imaging    No results found.   Medications:     Scheduled Medications:  amLODipine   5 mg Oral Daily   carvedilol   12.5 mg Oral BID WC   dorzolamide  1 drop Both Eyes QHS   furosemide   80 mg Intravenous Q12H   heparin   5,000 Units Subcutaneous Q8H   hydrALAZINE   50 mg Oral BID   insulin  aspart  0-5 Units Subcutaneous QHS   insulin  aspart  0-9 Units Subcutaneous TID WC   insulin  glargine-yfgn  6 Units Subcutaneous Daily   levothyroxine   125 mcg Oral Q0600   simvastatin   20 mg Oral QPM   sodium chloride  flush  3 mL Intravenous Q12H   timolol   1 drop Both Eyes Daily    Infusions:   PRN Medications: acetaminophen  **OR** acetaminophen , ipratropium-albuterol , ondansetron  **OR** ondansetron  (ZOFRAN ) IV, senna-docusate     Assessment/Plan   1. Acute on chronic HFpEF  - Echo 2/24: EF 55-60% RV ok. Grade II DD - Echo 6/25: EF 60-65%, RV ok. G1DD - NYHA IV on exam. Diuresis at home has been limited by renal dysfunction - significant volume overload on exam, needs volume off - sluggish diuresis despite 80 IV Lasix  Q8H - nephrology following and guiding diuresis. If continues w/ poor response despite diuretic increase,  suspect he will need HD.  - continue coreg  12.5 mg bid (dose reduced on admit)  - hold entresto  with renal function - off SGLT2i with scrotal/sacral wound (nec fasc) - continue hydralazine  50 mg bid - place UNNA boots   2. HTN  - moderately elevated  - pre-syncope with SBP < 130 - continue hydral + coreg  + amlodipine     3. CKD Stage IV - Baseline SCr 2.4-2.8 - up to 3.98 today w/ attempts at diuresis, oliguric   - renal US  RK 8.5cm LK 9.3cm with no e/o hydronephrosis  - diuretics and need for HD per nephrology   3. DMII - Hgb A1C 8.4 - per primary   4. OSA - Continue CPAP at night   5. Obesity  - Body mass index is 39.29 kg/m. - On Mounjaro  in OP, has been losing weight    Length of Stay: 2  Caffie Shed, PA-C  09/17/2024, 9:07 AM  Advanced Heart Failure Team Pager [phone] (M-F; 7a - 5p)  Please contact [facility] for night-coverage after hours (5p -7a ) and weekends on [email or web address]  Patient seen with PA, I formulated the plan and agree with the above note.   Creatinine up to 3.98 today without much urine output.   He is on oxygen but otherwise feels comfortable.   General: NAD Neck: Thick, JVP appears elevated 14-16 range, no thyromegaly or thyroid  nodule.  Lungs: Clear to auscultation bilaterally with normal respiratory effort. CV: Nondisplaced PMI.  Heart regular S1/S2, no S3/S4, no murmur.  2+ edema to knees.  Abdomen: Soft, nontender, no hepatosplenomegaly, no distention.  Skin: Intact without lesions or rashes.  Neurologic: Alert and oriented x 3.  Psych: Normal affect. Extremities: No clubbing or cyanosis.  HEENT: Normal.   HFpEF with CKD stage IV.  He is significantly volume overloaded, creatinine rising with poor diuresis. Cr up to 3.98.  - Agree with increase in Lasix  to 120 mg IV every 8 hrs.  - Will give a dose of metolazone 5 mg x 1.  - If ongoing poor response, may need to start RRT. I suspect he will need during this admission.    Ezra Shuck 09/17/2024 11:19 AM

## 2024-09-17 NOTE — Progress Notes (Signed)
 Brendan [name]. is an 68 y.o. male HFpEF, type 2 diabetes, hypertension, hyperlipidemia, hypothyroidism, prostate cancer, anemia of chronic disease, OSA on CPAP, CKD 4 followed by Dr. Marlee last seen in early October.  Patient recently admitted with necrotizing fasciitis of the scrotum/perineum status post surgical debridement on 10/10 as well as 10/12 with culture growing multipole organisms. treated with Unasyn as well as a outpatient 2-week course of Augmentin .  Farxiga  was discontinued at time of discharge as his creatinine was slightly elevated compared to baseline which is approximately 3.  Of note finerenone , Entresto  and torsemide  were held at time of discharge.  Unfortunately he has had a 18 pound weight gain since discharge with worsening shortness of breath past couple days before presentation especially with exertion, increased swelling in the lower extremities.  Torsemide  was restarted last Friday (5 days prior to presentation) by Dr. Marlee. In the ED BP was elevated Cr 3.7 BNP 729 and CXR showed pulmonary vasc congestion.   Assessment/Plan: Acute on CKD IV followed by Dr. Marlee. Concerned he may have progressed and is now CKDV with dialysis unfortunately possible in the near future. - Fortunately very poor response to diuresis; we need to intensify therapy and if there is a poor response that he may need dialysis.   - UNNA boots to help mobilize the fluid. - Will try aggressive diuresis; no absolute indication for dialysis but he's moving in that direction if we can't get a better response to diuretics.  - Renal u/s RK 8.5cm LK 9.3cm with no e/o hydronephrosis   -Monitor Daily I/Os, Daily weight  -Maintain MAP>65 for optimal renal perfusion.  - Avoid nephrotoxic agents such as IV contrast, NSAIDs, and phosphate containing bowel preps (FLEETS)   HFpEF EF 60-65% - followed by Dr. Cherrie, restarted on Torsemide  40mg  daily last Friday by Dr. Marlee with initially good response; pt  states ankle edema initially got better. Hypertension - initially but now much better controlled borderline relative hypotension for him. T2DM on SSI and long acting insulin  being managed by the primary. Anemia -will check iron panels but with possible active infection would not like to use IV iron.  With active inflammation ESA's also will not be very effective. OSA on CPAP  Subjective: Poor response to diuretics, sob not improved. Denies f/c/n/v.   Chemistry and CBC: Creatinine, Ser  Date/Time Value Ref Range Status  09/17/2024 03:22 AM 3.98 (H) 0.61 - 1.24 mg/dL Final  89/70/7974 97:49 AM 3.75 (H) 0.61 - 1.24 mg/dL Final  89/71/7974 92:86 PM 3.90 (H) 0.61 - 1.24 mg/dL Final  89/71/7974 93:53 PM 3.70 (H) 0.61 - 1.24 mg/dL Final  89/83/7974 95:40 AM 3.63 (H) 0.61 - 1.24 mg/dL Final  89/84/7974 98:51 AM 3.55 (H) 0.61 - 1.24 mg/dL Final  89/85/7974 96:57 AM 3.81 (H) 0.61 - 1.24 mg/dL Final  89/86/7974 95:73 AM 3.52 (H) 0.61 - 1.24 mg/dL Final  89/88/7974 96:73 AM 3.55 (H) 0.61 - 1.24 mg/dL Final  89/89/7974 95:86 PM 3.79 (H) 0.61 - 1.24 mg/dL Final  94/91/7974 89:72 AM 3.08 (H) 0.61 - 1.24 mg/dL Final  95/77/7974 87:86 PM 2.88 (H) 0.61 - 1.24 mg/dL Final  96/96/7974 89:57 AM 3.09 (H) 0.61 - 1.24 mg/dL Final  91/77/7975 88:56 AM 2.68 (H) 0.61 - 1.24 mg/dL Final  93/96/7975 88:81 AM 2.74 (H) 0.61 - 1.24 mg/dL Final  94/77/7975 97:45 PM 2.83 (H) 0.61 - 1.24 mg/dL Final  94/89/7975 96:61 PM 2.82 (H) 0.61 - 1.24 mg/dL Final  94/96/7975 90:86 AM 2.75 (  H) 0.61 - 1.24 mg/dL Final  95/80/7975 87:49 PM 3.08 (H) 0.61 - 1.24 mg/dL Final  95/90/7975 97:46 PM 2.84 (H) 0.61 - 1.24 mg/dL Final  96/70/7975 88:62 AM 2.88 (H) 0.61 - 1.24 mg/dL Final  96/88/7975 91:41 AM 2.91 (H) 0.61 - 1.24 mg/dL Final  97/71/7975 98:46 PM 3.45 (H) 0.61 - 1.24 mg/dL Final  97/76/7975 97:59 PM 2.93 (H) 0.61 - 1.24 mg/dL Final  97/76/7975 93:87 AM 2.78 (H) 0.61 - 1.24 mg/dL Final  97/77/7975 95:51 PM 2.63 (H) 0.61 -  1.24 mg/dL Final  97/77/7975 92:94 AM 2.47 (H) 0.61 - 1.24 mg/dL Final  97/78/7975 96:72 PM 2.32 (H) 0.61 - 1.24 mg/dL Final  97/78/7975 93:49 AM 2.36 (H) 0.61 - 1.24 mg/dL Final  97/78/7975 98:67 AM 2.36 (H) 0.61 - 1.24 mg/dL Final  97/79/7975 95:99 PM 2.23 (H) 0.61 - 1.24 mg/dL Final  97/79/7975 98:70 AM 2.43 (H) 0.61 - 1.24 mg/dL Final  97/80/7975 92:81 PM 2.43 (H) 0.61 - 1.24 mg/dL Final  89/83/7976 91:50 AM 2.17 (H) 0.76 - 1.27 mg/dL Final  90/78/7976 96:75 PM 2.14 (H) 0.76 - 1.27 mg/dL Final  93/96/7976 90:64 AM 2.90 (H) 0.61 - 1.24 mg/dL Final  96/91/7976 88:92 AM 2.46 (H) 0.76 - 1.27 mg/dL Final  89/70/7978 90:60 AM 2.26 (H) 0.61 - 1.24 mg/dL Final  89/92/7978 88:55 AM 2.57 (H) 0.76 - 1.27 mg/dL Final  91/87/7978 89:79 AM 2.68 (H) 0.61 - 1.24 mg/dL Final  91/94/7978 90:82 AM 2.71 (H) 0.61 - 1.24 mg/dL Final  92/70/7978 87:77 PM 2.32 (H) 0.61 - 1.24 mg/dL Final  94/74/7978 89:46 AM 2.41 (H) 0.61 - 1.24 mg/dL Final  94/81/7978 95:94 PM 2.41 (H) 0.61 - 1.24 mg/dL Final  94/90/7978 94:57 AM 2.19 (H) 0.61 - 1.24 mg/dL Final  94/91/7978 90:97 PM 2.27 (H) 0.61 - 1.24 mg/dL Final  94/91/7978 95:54 PM 2.11 (H) 0.61 - 1.24 mg/dL Final  94/91/7978 93:62 AM 2.20 (H) 0.61 - 1.24 mg/dL Final  94/92/7978 93:94 PM 2.24 (H) 0.61 - 1.24 mg/dL Final  92/71/7981 91:91 PM 1.59 (H) 0.61 - 1.24 mg/dL Final  95/94/7989 90:42 PM 2.1 (H) 0.4 - 1.5 mg/dL Final  88/79/7990 88:64 AM 1.58 (H)  Final   Recent Labs  Lab 09/15/24 1846 09/15/24 1913 09/16/24 0250 09/17/24 0322  NA 139 140 141 141  K 3.8 3.8 3.4* 3.6  CL 108 107 111 108  CO2 18*  --  18* 19*  GLUCOSE 174* 167* 155* 283*  BUN 41* 51* 40* 43*  CREATININE 3.70* 3.90* 3.75* 3.98*  CALCIUM 8.1*  --  8.2* 8.2*   Recent Labs  Lab 09/15/24 1846 09/15/24 1913 09/16/24 0250 09/17/24 0322  WBC 10.3  --  10.0 8.7  HGB 8.8* 9.2* 8.2* 8.8*  HCT 27.1* 27.0* 24.6* 25.7*  MCV 85.8  --  84.2 82.4  PLT 280  --  262 284   Liver Function  Tests: Recent Labs  Lab 09/15/24 1846  AST 31  ALT 34  ALKPHOS 82  BILITOT 1.0  PROT 6.3*  ALBUMIN 2.6*   No results for input(s): LIPASE, AMYLASE in the last 168 hours. No results for input(s): AMMONIA in the last 168 hours. Cardiac Enzymes: No results for input(s): CKTOTAL, CKMB, CKMBINDEX, TROPONINI in the last 168 hours. Iron Studies:  Recent Labs    09/17/24 0322  IRON 39*  TIBC 190*  FERRITIN 40   PT/INR: @LABRCNTIP (inr:5)  Xrays/Other Studies: ) Results for orders placed or performed during the hospital encounter of  09/15/24 (from the past 48 hours)  Basic metabolic panel     Status: Abnormal   Collection Time: 09/15/24  6:46 PM  Result Value Ref Range   Sodium 139 135 - 145 mmol/L   Potassium 3.8 3.5 - 5.1 mmol/L    Comment: HEMOLYSIS AT THIS LEVEL MAY AFFECT RESULT   Chloride 108 98 - 111 mmol/L   CO2 18 (L) 22 - 32 mmol/L   Glucose, Bld 174 (H) 70 - 99 mg/dL    Comment: Glucose reference range applies only to samples taken after fasting for at least 8 hours.   BUN 41 (H) 8 - 23 mg/dL   Creatinine, Ser 6.29 (H) 0.61 - 1.24 mg/dL   Calcium 8.1 (L) 8.9 - 10.3 mg/dL   GFR, Estimated 17 (L) >60 mL/min    Comment: (NOTE) Calculated using the CKD-EPI Creatinine Equation (2021)    Anion gap 13 5 - 15    Comment: Performed at [facility], [address] [place], [place] [address]  CBC     Status: Abnormal   Collection Time: 09/15/24  6:46 PM  Result Value Ref Range   WBC 10.3 4.0 - 10.5 K/uL   RBC 3.16 (L) 4.22 - 5.81 MIL/uL   Hemoglobin 8.8 (L) 13.0 - 17.0 g/dL   HCT 72.8 (L) 60.9 - 47.9 %   MCV 85.8 80.0 - 100.0 fL   MCH 27.8 26.0 - 34.0 pg   MCHC 32.5 30.0 - 36.0 g/dL   RDW 84.1 (H) 88.4 - 84.4 %   Platelets 280 150 - 400 K/uL   nRBC 0.0 0.0 - 0.2 %    Comment: Performed at [facility], [address], [place], [place] [address]  BNP (Order if Patient has history of Heart Failure)     Status: Abnormal   Collection Time:  09/15/24  6:46 PM  Result Value Ref Range   B Natriuretic Peptide 728.9 (H) 0.0 - 100.0 pg/mL    Comment: Performed at [facility], [address], [place], [place] [address]  Hepatic function panel     Status: Abnormal   Collection Time: 09/15/24  6:46 PM  Result Value Ref Range   Total Protein 6.3 (L) 6.5 - 8.1 g/dL   Albumin 2.6 (L) 3.5 - 5.0 g/dL   AST 31 15 - 41 U/L    Comment: HEMOLYSIS AT THIS LEVEL MAY AFFECT RESULT   ALT 34 0 - 44 U/L    Comment: HEMOLYSIS AT THIS LEVEL MAY AFFECT RESULT   Alkaline Phosphatase 82 38 - 126 U/L   Total Bilirubin 1.0 0.0 - 1.2 mg/dL    Comment: HEMOLYSIS AT THIS LEVEL MAY AFFECT RESULT   Bilirubin, Direct 0.3 (H) 0.0 - 0.2 mg/dL   Indirect Bilirubin 0.7 0.3 - 0.9 mg/dL    Comment: Performed at [facility], [address] [place], [place] [address]  I-stat chem 8, ED (not at [facility] or [facility])     Status: Abnormal   Collection Time: 09/15/24  7:13 PM  Result Value Ref Range   Sodium 140 135 - 145 mmol/L   Potassium 3.8 3.5 - 5.1 mmol/L   Chloride 107 98 - 111 mmol/L   BUN 51 (H) 8 - 23 mg/dL   Creatinine, Ser 6.09 (H) 0.61 - 1.24 mg/dL   Glucose, Bld 832 (H) 70 - 99 mg/dL    Comment: Glucose reference range applies only to samples taken after fasting for at  least 8 hours.   Calcium, Ion 1.13 (L) 1.15 - 1.40 mmol/L   TCO2 22 22 - 32 mmol/L   Hemoglobin 9.2 (L) 13.0 - 17.0 g/dL   HCT 72.9 (L) 60.9 - 47.9 %  CBG monitoring, ED     Status: None   Collection Time: 09/15/24 10:42 PM  Result Value Ref Range   Glucose-Capillary 78 70 - 99 mg/dL    Comment: Glucose reference range applies only to samples taken after fasting for at least 8 hours.  Urinalysis, Routine w reflex microscopic -Urine, Clean Catch     Status: Abnormal   Collection Time: 09/15/24 10:50 PM  Result Value Ref Range   Color, Urine YELLOW YELLOW   APPearance HAZY (A) CLEAR   Specific Gravity, Urine 1.009 1.005 - 1.030   pH 5.0 5.0 - 8.0   Glucose, UA NEGATIVE  NEGATIVE mg/dL   Hgb urine dipstick NEGATIVE NEGATIVE   Bilirubin Urine NEGATIVE NEGATIVE   Ketones, ur NEGATIVE NEGATIVE mg/dL   Protein, ur 899 (A) NEGATIVE mg/dL   Nitrite NEGATIVE NEGATIVE   Leukocytes,Ua NEGATIVE NEGATIVE   RBC / HPF 0-5 0 - 5 RBC/hpf   WBC, UA 0-5 0 - 5 WBC/hpf   Bacteria, UA NONE SEEN NONE SEEN   Squamous Epithelial / HPF 0-5 0 - 5 /HPF   Hyaline Casts, UA PRESENT     Comment: Performed at [facility], [address] [place], [place] [address]  Creatinine, urine, random     Status: None   Collection Time: 09/15/24 10:50 PM  Result Value Ref Range   Creatinine, Urine 93 mg/dL    Comment: Performed at [facility], [address] [place], [place] [address]  Sodium, urine, random     Status: None   Collection Time: 09/15/24 10:50 PM  Result Value Ref Range   Sodium, Ur 45 mmol/L    Comment: Performed at [facility], [address] [place], [place] [address]  T4, free     Status: None   Collection Time: 09/16/24  2:49 AM  Result Value Ref Range   Free T4 0.81 0.61 - 1.12 ng/dL    Comment: (NOTE) Biotin ingestion may interfere with free T4 tests. If the results are inconsistent with the TSH level, previous test results, or the clinical presentation, then consider biotin interference. If needed, order repeat testing after stopping biotin. Performed at [facility], [address], [place], [place] [address]   Magnesium      Status: None   Collection Time: 09/16/24  2:50 AM  Result Value Ref Range   Magnesium  1.8 1.7 - 2.4 mg/dL    Comment: Performed at [facility], [address], [place], [place] [address]  CBC     Status: Abnormal   Collection Time: 09/16/24  2:50 AM  Result Value Ref Range   WBC 10.0 4.0 - 10.5 K/uL   RBC 2.92 (L) 4.22 - 5.81 MIL/uL   Hemoglobin 8.2 (L) 13.0 - 17.0 g/dL   HCT 75.3 (L) 60.9 - 47.9 %   MCV 84.2 80.0 - 100.0 fL   MCH 28.1 26.0 - 34.0 pg   MCHC 33.3 30.0 - 36.0 g/dL   RDW 84.2 (H)  88.4 - 15.5 %   Platelets 262 150 - 400 K/uL   nRBC 0.0 0.0 - 0.2 %    Comment: Performed at [facility], [address] [place], [place] [address]  Basic metabolic panel  Status: Abnormal   Collection Time: 09/16/24  2:50 AM  Result Value Ref Range   Sodium 141 135 - 145 mmol/L   Potassium 3.4 (L) 3.5 - 5.1 mmol/L   Chloride 111 98 - 111 mmol/L   CO2 18 (L) 22 - 32 mmol/L   Glucose, Bld 155 (H) 70 - 99 mg/dL    Comment: Glucose reference range applies only to samples taken after fasting for at least 8 hours.   BUN 40 (H) 8 - 23 mg/dL   Creatinine, Ser 6.24 (H) 0.61 - 1.24 mg/dL   Calcium 8.2 (L) 8.9 - 10.3 mg/dL   GFR, Estimated 17 (L) >60 mL/min    Comment: (NOTE) Calculated using the CKD-EPI Creatinine Equation (2021)    Anion gap 12 5 - 15    Comment: Performed at [facility], [address] [place], [place] [address]  TSH     Status: Abnormal   Collection Time: 09/16/24  2:50 AM  Result Value Ref Range   TSH 7.823 (H) 0.350 - 4.500 uIU/mL    Comment: Performed by a 3rd Generation assay with a functional sensitivity of <=0.01 uIU/mL. Performed at [facility], [address], [place], [place] [address]   CBG monitoring, ED     Status: None   Collection Time: 09/16/24  7:49 AM  Result Value Ref Range   Glucose-Capillary 83 70 - 99 mg/dL    Comment: Glucose reference range applies only to samples taken after fasting for at least 8 hours.  CBG monitoring, ED     Status: Abnormal   Collection Time: 09/16/24 11:02 AM  Result Value Ref Range   Glucose-Capillary 112 (H) 70 - 99 mg/dL    Comment: Glucose reference range applies only to samples taken after fasting for at least 8 hours.  CBG monitoring, ED     Status: Abnormal   Collection Time: 09/16/24 11:52 AM  Result Value Ref Range   Glucose-Capillary 154 (H) 70 - 99 mg/dL    Comment: Glucose reference range applies only to samples taken after fasting for at least 8 hours.  CBG monitoring, ED      Status: Abnormal   Collection Time: 09/16/24  1:54 PM  Result Value Ref Range   Glucose-Capillary 132 (H) 70 - 99 mg/dL    Comment: Glucose reference range applies only to samples taken after fasting for at least 8 hours.   Comment 1 Document in Chart   Glucose, capillary     Status: Abnormal   Collection Time: 09/16/24  6:23 PM  Result Value Ref Range   Glucose-Capillary 220 (H) 70 - 99 mg/dL    Comment: Glucose reference range applies only to samples taken after fasting for at least 8 hours.  Glucose, capillary     Status: Abnormal   Collection Time: 09/16/24 10:33 PM  Result Value Ref Range   Glucose-Capillary 297 (H) 70 - 99 mg/dL    Comment: Glucose reference range applies only to samples taken after fasting for at least 8 hours.  Vitamin B12     Status: None   Collection Time: 09/17/24  3:22 AM  Result Value Ref Range   Vitamin B-12 443 180 - 914 pg/mL    Comment: (NOTE) This assay is not validated for testing neonatal or myeloproliferative syndrome specimens for Vitamin B12 levels. Performed at [facility], [address], [place], [place] [address]   Folate     Status: Abnormal   Collection Time:  09/17/24  3:22 AM  Result Value Ref Range   Folate 5.3 (L) >5.9 ng/mL    Comment: Performed at [facility], [address], [place], [place] [address]  Iron and TIBC     Status: Abnormal   Collection Time: 09/17/24  3:22 AM  Result Value Ref Range   Iron 39 (L) 45 - 182 ug/dL   TIBC 809 (L) 749 - 549 ug/dL   Saturation Ratios 21 17.9 - 39.5 %   UIBC 151 ug/dL    Comment: Performed at [facility], [address], [place], [place] [address]  Ferritin     Status: None   Collection Time: 09/17/24  3:22 AM  Result Value Ref Range   Ferritin 40 24 - 336 ng/mL    Comment: Performed at [facility], [address] [place], [place] [address]  Reticulocytes     Status: Abnormal   Collection Time: 09/17/24  3:22 AM  Result Value Ref Range   Retic Ct Pct  2.3 0.4 - 3.1 %   RBC. 3.10 (L) 4.22 - 5.81 MIL/uL   Retic Count, Absolute 71.9 19.0 - 186.0 K/uL   Immature Retic Fract 8.9 2.3 - 15.9 %    Comment: Performed at [facility], [address] [place], [place] [address]  CBC     Status: Abnormal   Collection Time: 09/17/24  3:22 AM  Result Value Ref Range   WBC 8.7 4.0 - 10.5 K/uL   RBC 3.12 (L) 4.22 - 5.81 MIL/uL   Hemoglobin 8.8 (L) 13.0 - 17.0 g/dL   HCT 74.2 (L) 60.9 - 47.9 %   MCV 82.4 80.0 - 100.0 fL   MCH 28.2 26.0 - 34.0 pg   MCHC 34.2 30.0 - 36.0 g/dL   RDW 84.0 (H) 88.4 - 84.4 %   Platelets 284 150 - 400 K/uL   nRBC 0.0 0.0 - 0.2 %    Comment: Performed at [facility], [address] [place], [place] [address]  Basic metabolic panel with GFR     Status: Abnormal   Collection Time: 09/17/24  3:22 AM  Result Value Ref Range   Sodium 141 135 - 145 mmol/L   Potassium 3.6 3.5 - 5.1 mmol/L   Chloride 108 98 - 111 mmol/L   CO2 19 (L) 22 - 32 mmol/L   Glucose, Bld 283 (H) 70 - 99 mg/dL    Comment: Glucose reference range applies only to samples taken after fasting for at least 8 hours.   BUN 43 (H) 8 - 23 mg/dL   Creatinine, Ser 6.01 (H) 0.61 - 1.24 mg/dL   Calcium 8.2 (L) 8.9 - 10.3 mg/dL   GFR, Estimated 16 (L) >60 mL/min    Comment: (NOTE) Calculated using the CKD-EPI Creatinine Equation (2021)    Anion gap 14 5 - 15    Comment: Performed at [facility], [address], [place], [place] [address]  Glucose, capillary     Status: Abnormal   Collection Time: 09/17/24  6:14 AM  Result Value Ref Range   Glucose-Capillary 243 (H) 70 - 99 mg/dL    Comment: Glucose reference range applies only to samples taken after fasting for at least 8 hours.   Comment 1 Notify RN    Comment 2 Document in Chart    US  RENAL Result Date: 09/16/2024 EXAM: US  Retroperitoneum Complete, Renal. CLINICAL HISTORY: [id number] Acute kidney injury superimposed on chronic kidney disease  [id number]. Acute kidney injury superimposed on  chronic kidney disease 8540519. TECHNIQUE: Real-time ultrasound of the retroperitoneum (complete) with image documentation. COMPARISON: CT abdomen and pelvis 10/24/2006. FINDINGS: RIGHT KIDNEY: Measures 8.5 x 5.3 x 5.3 cm, volume 126 ml. There is increased echogenicity. No hydronephrosis, renal stone, or mass visualized. LEFT KIDNEY: Measures 9.3 x 5.9 x 4.7 cm, volume 135 ml. There is increased echogenicity. No hydronephrosis or renal stone visualized. Cannot exclude focal mid left renal mass measuring 3.8 x 3.4 cm. BLADDER: The bladder is not seen. IMPRESSION: 1. Increased echogenicity in both kidneys, consistent with chronic kidney disease. 2. Cannot exclude focal mid left renal mass measuring 3.8 x 3.4 cm. Recommend further evaluation with renal CT or MRI. Electronically signed by: Greig Pique MD 09/16/2024 12:35 AM EDT RP Workstation: [id number]   DG Chest 2 View Result Date: 09/15/2024 EXAM: 2 VIEW(S) XRAY OF THE CHEST 09/15/2024 07:53:05 PM COMPARISON: Chest x-ray 01/07/2023. CLINICAL HISTORY: shob. Recently DC for nec fasc in groin. Pt was taken off of diuretic. Since stopping, has had increasing SHOB. No CP. Distended abdomen. Weighed 275 upon admission to hospital. Today pt is 302lb. SHOB increases with exertion. Bp 164/90 spo2 94% ra hr 60 cbg 264. FINDINGS: LUNGS AND PLEURA: No focal pulmonary opacity. No pneumothorax. Cardiomegaly with vascular congestion and mild edema, small pleural effusions, and streaky atelectasis at the bases HEART AND MEDIASTINUM: Cardiomegaly with central vascular congestion. BONES AND SOFT TISSUES: No acute osseous abnormality. IMPRESSION: 1. Cardiomegaly with pulmonary vascular congestion and mild interstitial edema. 2. Small bilateral pleural effusions. 3. Bibasilar atelectasis. Electronically signed by: Luke Bun MD 09/15/2024 08:03 PM EDT RP Workstation: [id number]    PMH:   Past Medical History:  Diagnosis Date   Acanthosis nigricans    Atopic dermatitis     CHF (congestive heart failure) (HCC)    CKD (chronic kidney disease) stage 4, GFR 15-29 ml/min (HCC) 01/08/2023   Diabetes (HCC) 11/19/2002   Erectile dysfunction    Fatty liver 11/19/2005   Glaucoma 11/19/2006   Gynecomastia 11/20/2007   Hx of adenomatous colonic polyps 08/26/2023   Hyperaldosteronism 11/19/1998   Hypercholesterolemia    Hyperlipidemia 2010   Hypertension 1999   Hypoglycemic reaction    Hypothyroidism 11/19/2002   Incontinence    Intermittent vertigo 11/19/2010   Left cervical radiculopathy 11/19/2010   Lumbar radiculopathy    Obesity    Peripheral neuropathy    Pollen allergies 11/19/2005   perennial   Prostate cancer (HCC) 11/19/2004   Reflux esophagitis 11/19/1993   Sickle cell trait 11/19/2004   Sleep apnea, obstructive 11/19/1998   uses a cpap   Venous insufficiency 11/20/2003   Vitamin D deficiency 11/19/2010    PSH:   Past Surgical History:  Procedure Laterality Date   COLONOSCOPY  2006   normal   INCISION AND DRAINAGE OF WOUND N/A 08/28/2024   Procedure: IRRIGATION AND EXCISIONAL DEBRIDEMENT WOUND;  Surgeon: Lyndel Deward PARAS, MD;  Location: [facility] OR;  Service: General;  Laterality: N/A;  EXCISIONAL DEBRIDEMENT   INCISION AND DRAINAGE PERIRECTAL ABSCESS N/A 08/30/2024   Procedure: INCISION AND DRAINAGE OF PERINEAL WOUND;  Surgeon: Polly Cordella LABOR, MD;  Location: [facility] OR;  Service: General;  Laterality: N/A;   lap band surgery  2009   left inguinal hernia repair  1998   LIPOMA EXCISION Left 04/17/2023   Procedure: MINOR EXCISION LEFT BUTTOCK SEBACEOUS CYST;  Surgeon: Lyndel Deward PARAS, MD;  Location: [facility];  Service: General;  Laterality: Left;  robotic prostatectomy  2008    Allergies:  Allergies  Allergen Reactions   Ace Inhibitors Cough   Maxidex [Dexamethasone] Other (See Comments)    Sexual dysfunction   Verapamil Other (See Comments)    constipation    Medications:   Prior to Admission medications    Medication Sig Start Date End Date Taking? Authorizing Provider  acetaminophen  (TYLENOL ) 500 MG tablet Take 500 mg by mouth every 6 (six) hours as needed for mild pain.   Yes [provider]  amLODipine  (NORVASC ) 5 MG tablet Take 1 tablet (5 mg total) by mouth daily. 09/04/24 10/04/24 Yes Perri DELENA Meliton Mickey., MD  carvedilol  (COREG ) 25 MG tablet Take 1 tablet (25 mg total) by mouth 2 (two) times daily with food 04/01/24  Yes   Continuous Glucose Sensor (FREESTYLE LIBRE 3 PLUS SENSOR) MISC Apply to upper back of arm. Change every 15 days. 04/03/24  Yes   Continuous Glucose Sensor (FREESTYLE LIBRE 3 SENSOR) MISC Apply to upper back of arm and change ever 14 days. 01/28/24  Yes Faythe Purchase, MD  dorzolamide (TRUSOPT) 2 % ophthalmic solution Place 1 drop into both eyes daily.   Yes [provider]  hydrALAZINE  (APRESOLINE ) 100 MG tablet Take 1/2 tablet (50 mg total) by mouth 2 (two) times daily. 08/30/23  Yes Clegg, Amy D, NP  insulin  glargine (LANTUS  SOLOSTAR) 100 UNIT/ML Solostar Pen Inject 15 Units into the skin daily. Follow up with your PCP outpatient for further adjustment to your regimen 09/03/24  Yes Perri DELENA Meliton Mickey., MD  insulin  lispro (HUMALOG  [facility]) 200 UNIT/ML KwikPen Inject 0-15 Units into the skin 3 (three) times daily with meals. CBG < 70: treat low blood sugar CBG 70 - 120: 0 units  CBG 121 - 150: 2 units  CBG 151 - 200: 3 units  CBG 201 - 250: 5 units  CBG 251 - 300: 8 units  CBG 301 - 350: 11 units  CBG 351 - 400: 15 units  CBG >400: Call MD 09/03/24  Yes Perri DELENA Meliton Mickey., MD  levothyroxine  (SYNTHROID ) 125 MCG tablet Take 1 tablet (125 mcg total) by mouth every morning on an empty stomach 06/08/24  Yes   simvastatin  (ZOCOR ) 20 MG tablet Take 1 tablet (20 mg total) by mouth every evening. 07/09/24  Yes   timolol  (BETIMOL ) 0.5 % ophthalmic solution Place 1 drop into both eyes daily.   Yes [provider]  tirzepatide  (MOUNJARO ) 15 MG/0.5ML Pen  Inject 15 mg into the skin once a week. 04/14/24  Yes   torsemide  (DEMADEX ) 20 MG tablet Take 3 tablets (60 mg total) by mouth daily. Patient taking differently: Take 40 mg by mouth daily. 08/08/23 09/15/25 Yes [place], Harlene HERO, FNP  clobetasol  ointment (TEMOVATE ) 0.05 % Apply topically daily before sleeping. Patient not taking: Reported on 08/29/2024 05/19/24     Finerenone  (KERENDIA ) 20 MG TABS Take 20 mg by mouth daily. Patient not taking: Reported on 09/15/2024    [provider]  Insulin  Pen Needle (TECHLITE PEN NEEDLES) 32G X 4 MM MISC Use to inject insulin  4 times daily 10/25/22   Faythe Purchase, MD  Insulin  Pen Needle 32G X 4 MM MISC Use to inject insulin  up to 4 times daily. 12/26/23   Faythe Purchase, MD  potassium chloride  SA (KLOR-CON  M) 20 MEQ tablet Take 3 tablets (60 mEq total) by mouth daily. Patient not taking: Reported on 09/15/2024 03/10/24   Glena Harlene HERO, FNP  sacubitril -valsartan  (ENTRESTO ) 49-51 MG Take  1 tablet by mouth 2 (two) times daily. Patient not taking: Reported on 09/15/2024 08/25/24   Bensimhon, Toribio SAUNDERS, MD    Discontinued Meds:   Medications Discontinued During This Encounter  Medication Reason   albuterol  (VENTOLIN  HFA) 108 (90 Base) MCG/ACT inhaler 2 puff    insulin  glargine-yfgn (SEMGLEE ) injection 6 Units    timolol  (BETIMOL ) 0.5 % ophthalmic solution 1 drop    potassium chloride  (KLOR-CON  M) CR tablet 10 mEq    carvedilol  (COREG ) tablet 25 mg    dorzolamide (TRUSOPT) 2 % ophthalmic solution 1 drop     Social History:  reports that he has never smoked. He has never used smokeless tobacco. He reports current alcohol use. He reports that he does not use drugs.  Family History:   Family History  Problem Relation Age of Onset   Heart failure Mother    Hypertension Father        deceased age 24   Heart attack Father    COPD Sister    CVA Sister    Diabetes Daughter    Colon cancer Neg Hx    Stomach cancer Neg Hx    Colon polyps Neg Hx     Esophageal cancer Neg Hx    Rectal cancer Neg Hx     Blood pressure 136/72, pulse 65, temperature (!) 97.4 F (36.3 C), temperature source Oral, resp. rate 16, height 6' 2 (1.88 m), weight (!) 142.2 kg, SpO2 100%. Physical Exam: GEN: NAD, A&Ox3, NCAT, obese HEENT: No conjunctival pallor, EOMI NECK: Supple, no thyromegaly LUNGS: CTA B/L no rales, rhonchi or wheezing CV: RRR, No M/R/G ABD: SNDNT +BS  EXT: 1+ lower extremity edema GU: male purewick    MELIA LYNWOOD ORN, MD 09/17/2024, 10:30 AM

## 2024-09-17 NOTE — Progress Notes (Signed)
 Orthopedic Tech Progress Note Patient Details:  Brendan [name]. November 11, 1956 [id number]  Brendan [name] boots applied by Thersia (ortho tech). These will need to be replaced Monday, 11/3, if pt remains admitted.  Ortho Devices Type of Ortho Device: Radio broadcast assistant Ortho Device/Splint Location: BLE Ortho Device/Splint Interventions: Ordered, Application, Adjustment   Post Interventions Patient Tolerated: Well Instructions Provided: Care of device  [name] [name] 09/17/2024, 5:01 PM

## 2024-09-18 ENCOUNTER — Inpatient Hospital Stay (HOSPITAL_COMMUNITY)

## 2024-09-18 ENCOUNTER — Encounter (HOSPITAL_COMMUNITY): Payer: Self-pay | Admitting: Internal Medicine

## 2024-09-18 DIAGNOSIS — I5033 Acute on chronic diastolic (congestive) heart failure: Secondary | ICD-10-CM | POA: Diagnosis not present

## 2024-09-18 DIAGNOSIS — E039 Hypothyroidism, unspecified: Secondary | ICD-10-CM | POA: Diagnosis not present

## 2024-09-18 DIAGNOSIS — N184 Chronic kidney disease, stage 4 (severe): Secondary | ICD-10-CM | POA: Diagnosis not present

## 2024-09-18 DIAGNOSIS — N1832 Chronic kidney disease, stage 3b: Secondary | ICD-10-CM | POA: Diagnosis not present

## 2024-09-18 DIAGNOSIS — E7800 Pure hypercholesterolemia, unspecified: Secondary | ICD-10-CM | POA: Diagnosis not present

## 2024-09-18 HISTORY — PX: IR TUNNELED CENTRAL VENOUS CATH PLC W IMG: IMG1939

## 2024-09-18 LAB — BASIC METABOLIC PANEL WITH GFR
Anion gap: 15 (ref 5–15)
BUN: 44 mg/dL — ABNORMAL HIGH (ref 8–23)
CO2: 20 mmol/L — ABNORMAL LOW (ref 22–32)
Calcium: 8.5 mg/dL — ABNORMAL LOW (ref 8.9–10.3)
Chloride: 106 mmol/L (ref 98–111)
Creatinine, Ser: 4.09 mg/dL — ABNORMAL HIGH (ref 0.61–1.24)
GFR, Estimated: 15 mL/min — ABNORMAL LOW (ref >60)
Glucose, Bld: 189 mg/dL — ABNORMAL HIGH (ref 70–99)
Potassium: 3.6 mmol/L (ref 3.5–5.1)
Sodium: 141 mmol/L (ref 135–145)

## 2024-09-18 LAB — CBC
HCT: 25.9 % — ABNORMAL LOW (ref 39.0–52.0)
Hemoglobin: 8.5 g/dL — ABNORMAL LOW (ref 13.0–17.0)
MCH: 28 pg (ref 26.0–34.0)
MCHC: 32.8 g/dL (ref 30.0–36.0)
MCV: 85.2 fL (ref 80.0–100.0)
Platelets: 287 K/uL (ref 150–400)
RBC: 3.04 MIL/uL — ABNORMAL LOW (ref 4.22–5.81)
RDW: 16.1 % — ABNORMAL HIGH (ref 11.5–15.5)
WBC: 8.9 K/uL (ref 4.0–10.5)
nRBC: 0 % (ref 0.0–0.2)

## 2024-09-18 LAB — GLUCOSE, CAPILLARY
Glucose-Capillary: 191 mg/dL — ABNORMAL HIGH (ref 70–99)
Glucose-Capillary: 185 mg/dL — ABNORMAL HIGH (ref 70–99)
Glucose-Capillary: 196 mg/dL — ABNORMAL HIGH (ref 70–99)

## 2024-09-18 LAB — HEPATITIS B SURFACE ANTIGEN: Hepatitis B Surface Ag: NONREACTIVE

## 2024-09-18 MED ORDER — LIDOCAINE HCL (PF) 1 % IJ SOLN
5.0000 mL | INTRAMUSCULAR | Status: DC | PRN
  Administered 2024-09-18: 5 mL via INTRADERMAL
  Filled 2024-09-18: qty 5

## 2024-09-18 MED ORDER — FUROSEMIDE 10 MG/ML IJ SOLN
120.0000 mg | Freq: Two times a day (BID) | INTRAVENOUS | Status: DC
  Filled 2024-09-18 (×2): qty 12
  Filled 2024-09-18: qty 10

## 2024-09-18 MED ORDER — HEPARIN SODIUM (PORCINE) 1000 UNIT/ML IJ SOLN
INTRAMUSCULAR | Status: AC
  Filled 2024-09-18: qty 10

## 2024-09-18 MED ORDER — FENTANYL CITRATE (PF) 100 MCG/2ML IJ SOLN
INTRAMUSCULAR | Status: AC
  Filled 2024-09-18: qty 2

## 2024-09-18 MED ORDER — FENTANYL CITRATE (PF) 100 MCG/2ML IJ SOLN
INTRAMUSCULAR | Status: AC | PRN
  Administered 2024-09-18: 25 ug via INTRAVENOUS

## 2024-09-18 MED ORDER — LIDOCAINE-PRILOCAINE 2.5-2.5 % EX CREA: 1.0000 | TOPICAL_CREAM | CUTANEOUS | Status: DC | PRN

## 2024-09-18 MED ORDER — LIDOCAINE HCL 1 % IJ SOLN
INTRAMUSCULAR | Status: AC
  Filled 2024-09-18: qty 20

## 2024-09-18 MED ORDER — CEFAZOLIN SODIUM-DEXTROSE 2-4 GM/100ML-% IV SOLN: 2.0000 g | Freq: Once | INTRAVENOUS | Status: DC

## 2024-09-18 MED ORDER — MIDAZOLAM HCL (PF) 2 MG/2ML IJ SOLN
INTRAMUSCULAR | Status: AC | PRN
  Administered 2024-09-18: .5 mg via INTRAVENOUS

## 2024-09-18 MED ORDER — HEPARIN SODIUM (PORCINE) 1000 UNIT/ML DIALYSIS
1000.0000 [IU] | INTRAMUSCULAR | Status: DC | PRN
Start: 2024-09-18 — End: 2024-09-18

## 2024-09-18 MED ORDER — PENTAFLUOROPROP-TETRAFLUOROETH EX AERO: 1.0000 | INHALATION_SPRAY | CUTANEOUS | Status: DC | PRN

## 2024-09-18 MED ORDER — CEFAZOLIN SODIUM-DEXTROSE 2-4 GM/100ML-% IV SOLN
INTRAVENOUS | Status: AC
  Filled 2024-09-18: qty 100

## 2024-09-18 MED ORDER — CHLORHEXIDINE GLUCONATE CLOTH 2 % EX PADS
6.0000 | MEDICATED_PAD | Freq: Every day | CUTANEOUS | Status: DC
  Administered 2024-09-18 – 2024-09-22 (×5): 6 via TOPICAL

## 2024-09-18 MED ORDER — MIDAZOLAM HCL 2 MG/2ML IJ SOLN
INTRAMUSCULAR | Status: AC
  Filled 2024-09-18: qty 2

## 2024-09-18 MED ORDER — ANTICOAGULANT SODIUM CITRATE 4% (200MG/5ML) IV SOLN: 5.0000 mL | Status: DC | PRN

## 2024-09-18 MED ORDER — HEPARIN SODIUM (PORCINE) 1000 UNIT/ML IJ SOLN
INTRAMUSCULAR | Status: AC
  Filled 2024-09-18: qty 3

## 2024-09-18 MED ORDER — ALTEPLASE 2 MG IJ SOLR: 2.0000 mg | Freq: Once | INTRAMUSCULAR | Status: DC | PRN

## 2024-09-18 NOTE — H&P (Signed)
 Patient Status: [facility] - In-pt  Assessment and Plan: Patient in need of venous access for initiation of dialysis  Patient with ESRD, suspected fluid overload with respiratory distress.  He is currently on 5 L [place] with audible wheeze despite nebulizer this AM.  Patient likely in need of dilaysis today with suspected long-term implication.  Plan to proceed with tunneled HD catheter.   Discussed lying flat with patient and RN.  RN to optimize with PRN breathing treatment prior to IR.  Patient aware that he has to lie flat for procedure.  May need to do with local anesthetic only if unable to tolerate sedation.  He is agreeable.   Risks and benefits discussed with the patient including, but not limited to bleeding, infection, vascular injury, pneumothorax which may require chest tube placement, air embolism or even death  All of the patient's questions were answered, patient is agreeable to proceed. Consent signed and in chart.  ______________________________________________________________________   History of Present Illness: Brendan Klas. is a 68 y.o. male with ESRD here with respiratory distress, possible fluid overload.  TUnneled dialysis catheter requested.   Allergies and medications reviewed.   Review of Systems: A 12 point ROS discussed and pertinent positives are indicated in the HPI above.  All other systems are negative.  Review of Systems  Constitutional:  Negative for fatigue and fever.  Respiratory:  Positive for cough and shortness of breath.   Cardiovascular:  Negative for chest pain.  Gastrointestinal:  Negative for abdominal pain.  Musculoskeletal:  Negative for back pain.  Psychiatric/Behavioral:  Negative for behavioral problems and confusion.     Vital Signs: BP (!) 147/77 (BP Location: Right Arm)   Pulse 63   Temp (!) 97.4 F (36.3 C) (Oral)   Resp 14   Ht 6' 2 (1.88 m)   Wt (!) 305 lb 5.4 oz (138.5 kg)   SpO2 100%   BMI 39.20 kg/m   Physical  Exam Vitals and nursing note reviewed.  Constitutional:      General: He is not in acute distress.    Appearance: He is ill-appearing.  Cardiovascular:     Rate and Rhythm: Normal rate and regular rhythm.  Pulmonary:     Effort: Tachypnea present.     Comments: Audible wheeze, on 5L [place] Skin:    General: Skin is warm and dry.  Neurological:     General: No focal deficit present.     Mental Status: He is alert and oriented to person, place, and time.  Psychiatric:        Mood and Affect: Mood normal.        Behavior: Behavior normal.      Imaging reviewed.   Labs:  COAGS: Recent Labs    08/28/24 1613  INR 1.0    BMP: Recent Labs    09/15/24 1846 09/15/24 1913 09/16/24 0250 09/17/24 0322 09/18/24 0319  NA 139 140 141 141 141  K 3.8 3.8 3.4* 3.6 3.6  CL 108 107 111 108 106  CO2 18*  --  18* 19* 20*  GLUCOSE 174* 167* 155* 283* 189*  BUN 41* 51* 40* 43* 44*  CALCIUM 8.1*  --  8.2* 8.2* 8.5*  CREATININE 3.70* 3.90* 3.75* 3.98* 4.09*  GFRNONAA 17*  --  17* 16* 15*       Electronically Signed: Solmon Selmer Ku, PA 09/18/2024, 11:13 AM   I spent a total of 25 minutes in face to face in clinical consultation, greater than  50% of which was counseling/coordinating care for venous access.

## 2024-09-18 NOTE — Progress Notes (Addendum)
 Advanced Heart Failure Rounding Note  Cardiologist: Dr. Cherrie  Chief Complaint: a/c HFpEF   Patient Profile   68 y.o. male with history of HFpEF, HTN, OSA on CPAP, DM2, hypothyroidism, CKD IV, and obesity admitted w/ a/c HFpEF w/ marked volume overload. Diuresis c/b progressive renal failure.    Subjective:    Failed high dose lasix  and metolazone. Only 850 cc in UOP yesterday. SCr continues to trend up now > 4.0. K 3.6.  Nephrology preparing for HD.   He has BP room, SBPs 140s-150s.  Still SOB and wheezy on 6 L [place].  Worried about starting HD.     Objective:   Weight Range: (!) 138.5 kg Body mass index is 39.2 kg/m.   Vital Signs:   Temp:  [97.4 F (36.3 C)-97.5 F (36.4 C)] 97.5 F (36.4 C) (10/31 0343) Pulse Rate:  [62-73] 73 (10/31 0343) Resp:  [16-18] 18 (10/31 0343) BP: (126-157)/(69-95) 157/95 (10/31 0343) SpO2:  [96 %-100 %] 100 % (10/31 0343) Weight:  [138.5 kg] 138.5 kg (10/31 0650) Last BM Date : 09/16/24  Weight change: Filed Weights   09/15/24 1837 09/17/24 0359 09/18/24 0650  Weight: (!) 138.8 kg (!) 142.2 kg (!) 138.5 kg    Intake/Output:   Intake/Output Summary (Last 24 hours) at 09/18/2024 0751 Last data filed at 09/18/2024 0347 Gross per 24 hour  Intake 315 ml  Output 850 ml  Net -535 ml      Physical Exam    GENERAL: obese, NAD Lungs- diminished + b/l expiratory wheezing  CARDIAC:  thick neck, JVD elevated          Normal rate with regular rhythm. No MRG. 2 + b/l LEE up to thighs ABDOMEN: obese, soft, non-tender, non-distended.  EXTREMITIES: Warm and well perfused.  NEUROLOGIC: No obvious FND   Telemetry   NSR 60s, personally reviewed   EKG    N/A   Labs    CBC Recent Labs    09/16/24 0250 09/17/24 0322  WBC 10.0 8.7  HGB 8.2* 8.8*  HCT 24.6* 25.7*  MCV 84.2 82.4  PLT 262 284   Basic Metabolic Panel Recent Labs    89/70/74 0250 09/17/24 0322 09/18/24 0319  NA 141 141 141  K 3.4* 3.6 3.6  CL 111  108 106  CO2 18* 19* 20*  GLUCOSE 155* 283* 189*  BUN 40* 43* 44*  CREATININE 3.75* 3.98* 4.09*  CALCIUM 8.2* 8.2* 8.5*  MG 1.8  --   --    Liver Function Tests Recent Labs    09/15/24 1846  AST 31  ALT 34  ALKPHOS 82  BILITOT 1.0  PROT 6.3*  ALBUMIN 2.6*   No results for input(s): LIPASE, AMYLASE in the last 72 hours. Cardiac Enzymes No results for input(s): CKTOTAL, CKMB, CKMBINDEX, TROPONINI in the last 72 hours.  BNP: BNP (last 3 results) Recent Labs    01/20/24 1042 09/15/24 1846  BNP 295.6* 728.9*    ProBNP (last 3 results) Recent Labs    08/28/24 1613  PROBNP 6,038.0*     D-Dimer No results for input(s): DDIMER in the last 72 hours. Hemoglobin A1C No results for input(s): HGBA1C in the last 72 hours. Fasting Lipid Panel No results for input(s): CHOL, HDL, LDLCALC, TRIG, CHOLHDL, LDLDIRECT in the last 72 hours. Thyroid  Function Tests Recent Labs    09/16/24 0250  TSH 7.823*    Other results:   Imaging    No results found.   Medications:  Scheduled Medications:  amLODipine   5 mg Oral Daily   carvedilol   12.5 mg Oral BID WC   Chlorhexidine  Gluconate Cloth  6 each Topical Q0600   dorzolamide  1 drop Both Eyes Daily   heparin   5,000 Units Subcutaneous Q8H   hydrALAZINE   50 mg Oral BID   insulin  aspart  0-5 Units Subcutaneous QHS   insulin  aspart  0-9 Units Subcutaneous TID WC   insulin  glargine-yfgn  6 Units Subcutaneous Daily   levothyroxine   125 mcg Oral Q0600   simvastatin   20 mg Oral QPM   sodium chloride  flush  3 mL Intravenous Q12H   timolol   1 drop Both Eyes Daily    Infusions:  furosemide  120 mg (09/18/24 0653)    PRN Medications: acetaminophen  **OR** acetaminophen , ipratropium-albuterol , ondansetron  **OR** ondansetron  (ZOFRAN ) IV, senna-docusate     Assessment/Plan   1. Acute on chronic HFpEF  - Echo 2/24: EF 55-60% RV ok. Grade II DD - Echo 6/25: EF 60-65%, RV ok. G1DD - NYHA IV  on admission w/ significant volume overload. Diuresis c/b by worsening renal failure.  - Failed high dose lasix  and metolazone. Only 850 cc in UOP yesterday. SCr continues to trend up now > 4.0. Nephrology planning to start HD  - off ARNi with renal function - off SGLT2i with scrotal/sacral wound (nec fasc) - continue hydralazine  50 mg bid - continue coreg  12.5 mg bid (dose reduced on admit)  - can dose adjust hydral and coreg  as needed if hypotension occurs w/ HD  2. HTN  - moderately elevated  - pre-syncope with SBP < 130 - continue hydral + coreg  + amlodipine     3. CKD Stage IV - Baseline SCr 2.4-2.8 - up to 4.0 today w/ attempts at diuresis   - renal US  RK 8.5cm LK 9.3cm with no e/o hydronephrosis  - Nephrology planning to initiate HD    3. DMII - Hgb A1C 8.4 - per primary   4. OSA - Continue CPAP at night   5. Obesity  - Body mass index is 39.29 kg/m. - On M[place]jaro  in OP, has been losing weight    Length of Stay: [address], PA-C  09/18/2024, 7:51 AM  Advanced Heart Failure Team Pager [phone] (M-F; 7a - 5p)  Please contact [facility] for night-coverage after hours (5p -7a ) and weekends on [email or web address]  Patient seen with PA, I formulated the plan and agree with the above note.   UOP 850 cc yesterday despite high dose diuretics.  He is on 6L [place]. SBP 150s.   General: NAD Neck: Thick. JVP difficult but appears elevated, no thyromegaly or thyroid  nodule.  Lungs: Occasional wheezes CV: Nondisplaced PMI.  Heart regular S1/S2, no S3/S4, no murmur.  No peripheral edema.   Abdomen: Soft, nontender, no hepatosplenomegaly, no distention.  Skin: Intact without lesions or rashes.  Neurologic: Alert and oriented x 3.  Psych: Normal affect. Extremities: No clubbing or cyanosis.  HEENT: Normal.   Patient is volume overloaded, creatinine up to 4.09 and has failed trial of high dose diuretics.  He needs to start dialysis today.  Discussed with nephrology, plan  to arrange catheter placement followed by HD today.  SBP 150s, should tolerate HD.    Can adjust down doses of anti-hypertensives if needed to allow HD, but BP currently elevated.   Cardiology will sign off at this point, please call with questions.   Ezra Shuck 09/18/2024 .nwo

## 2024-09-18 NOTE — Progress Notes (Signed)
 Brendan Helbert. is an 68 y.o. male HFpEF, type 2 diabetes, hypertension, hyperlipidemia, hypothyroidism, prostate cancer, anemia of chronic disease, OSA on CPAP, CKD 4 followed by Dr. Marlee last seen in early October.  Patient recently admitted with necrotizing fasciitis of the scrotum/perineum status post surgical debridement on 10/10 as well as 10/12 with culture growing multipole organisms. treated with Unasyn as well as a outpatient 2-week course of Augmentin .  Farxiga  was discontinued at time of discharge as his creatinine was slightly elevated compared to baseline which is approximately 3.  Of note finerenone , Entresto  and torsemide  were held at time of discharge.  Unfortunately he has had a 18 pound weight gain since discharge with worsening shortness of breath past couple days before presentation especially with exertion, increased swelling in the lower extremities.  Torsemide  was restarted last Friday (5 days prior to presentation) by Dr. Marlee. In the ED BP was elevated Cr 3.7 BNP 729 and CXR showed pulmonary vasc congestion.   Assessment/Plan: Acute on CKD IV followed by Dr. Marlee. Concerned he may have progressed and is now CKDV with dialysis unfortunately possible in the near future. - Unfortunately very poor response to high dose diuresis; I d/w the pt at length yest + today and we will need to start HD. He is markedly overloaded; states SOB a little better but on exam he looks as uncomfortable as yest. PVR 0 on 10/30. - HD orders written  - UNNA boots to help mobilize the fluid -> placed this AM (10/31). - Renal u/s RK 8.5cm LK 9.3cm with no e/o hydronephrosis   -Monitor Daily I/Os, Daily weight  -Maintain MAP>65 for optimal renal perfusion.  - Avoid nephrotoxic agents such as IV contrast, NSAIDs, and phosphate containing bowel preps (FLEETS)   HFpEF EF 60-65% - followed by Dr. Cherrie, restarted on Torsemide  40mg  daily last Friday by Dr. Marlee with initially good response; pt  states ankle edema initially got better. Hypertension - initially but now much better controlled borderline relative hypotension for him. T2DM on SSI and long acting insulin  being managed by the primary. Anemia -will check iron panels but with possible active infection would not like to use IV iron.  With active inflammation ESA's also will not be very effective. OSA on CPAP  Subjective: Poor response to high dose diuretics, sob minimally improved.  States some leakage from purewick but I doubt it's very much. PVR 0 on 10/30. Denies f/c/n/v.   Chemistry and CBC: Creatinine, Ser  Date/Time Value Ref Range Status  09/18/2024 03:19 AM 4.09 (H) 0.61 - 1.24 mg/dL Final  89/69/7974 96:77 AM 3.98 (H) 0.61 - 1.24 mg/dL Final  89/70/7974 97:49 AM 3.75 (H) 0.61 - 1.24 mg/dL Final  89/71/7974 92:86 PM 3.90 (H) 0.61 - 1.24 mg/dL Final  89/71/7974 93:53 PM 3.70 (H) 0.61 - 1.24 mg/dL Final  89/83/7974 95:40 AM 3.63 (H) 0.61 - 1.24 mg/dL Final  89/84/7974 98:51 AM 3.55 (H) 0.61 - 1.24 mg/dL Final  89/85/7974 96:57 AM 3.81 (H) 0.61 - 1.24 mg/dL Final  89/86/7974 95:73 AM 3.52 (H) 0.61 - 1.24 mg/dL Final  89/88/7974 96:73 AM 3.55 (H) 0.61 - 1.24 mg/dL Final  89/89/7974 95:86 PM 3.79 (H) 0.61 - 1.24 mg/dL Final  94/91/7974 89:72 AM 3.08 (H) 0.61 - 1.24 mg/dL Final  95/77/7974 87:86 PM 2.88 (H) 0.61 - 1.24 mg/dL Final  96/96/7974 89:57 AM 3.09 (H) 0.61 - 1.24 mg/dL Final  91/77/7975 88:56 AM 2.68 (H) 0.61 - 1.24 mg/dL Final  93/96/7975 88:81 AM  2.74 (H) 0.61 - 1.24 mg/dL Final  94/77/7975 97:45 PM 2.83 (H) 0.61 - 1.24 mg/dL Final  94/89/7975 96:61 PM 2.82 (H) 0.61 - 1.24 mg/dL Final  94/96/7975 90:86 AM 2.75 (H) 0.61 - 1.24 mg/dL Final  95/80/7975 87:49 PM 3.08 (H) 0.61 - 1.24 mg/dL Final  95/90/7975 97:46 PM 2.84 (H) 0.61 - 1.24 mg/dL Final  96/70/7975 88:62 AM 2.88 (H) 0.61 - 1.24 mg/dL Final  96/88/7975 91:41 AM 2.91 (H) 0.61 - 1.24 mg/dL Final  97/71/7975 98:46 PM 3.45 (H) 0.61 - 1.24 mg/dL Final   97/76/7975 97:59 PM 2.93 (H) 0.61 - 1.24 mg/dL Final  97/76/7975 93:87 AM 2.78 (H) 0.61 - 1.24 mg/dL Final  97/77/7975 95:51 PM 2.63 (H) 0.61 - 1.24 mg/dL Final  97/77/7975 92:94 AM 2.47 (H) 0.61 - 1.24 mg/dL Final  97/78/7975 96:72 PM 2.32 (H) 0.61 - 1.24 mg/dL Final  97/78/7975 93:49 AM 2.36 (H) 0.61 - 1.24 mg/dL Final  97/78/7975 98:67 AM 2.36 (H) 0.61 - 1.24 mg/dL Final  97/79/7975 95:99 PM 2.23 (H) 0.61 - 1.24 mg/dL Final  97/79/7975 98:70 AM 2.43 (H) 0.61 - 1.24 mg/dL Final  97/80/7975 92:81 PM 2.43 (H) 0.61 - 1.24 mg/dL Final  89/83/7976 91:50 AM 2.17 (H) 0.76 - 1.27 mg/dL Final  90/78/7976 96:75 PM 2.14 (H) 0.76 - 1.27 mg/dL Final  93/96/7976 90:64 AM 2.90 (H) 0.61 - 1.24 mg/dL Final  96/91/7976 88:92 AM 2.46 (H) 0.76 - 1.27 mg/dL Final  89/70/7978 90:60 AM 2.26 (H) 0.61 - 1.24 mg/dL Final  89/92/7978 88:55 AM 2.57 (H) 0.76 - 1.27 mg/dL Final  91/87/7978 89:79 AM 2.68 (H) 0.61 - 1.24 mg/dL Final  91/94/7978 90:82 AM 2.71 (H) 0.61 - 1.24 mg/dL Final  92/70/7978 87:77 PM 2.32 (H) 0.61 - 1.24 mg/dL Final  94/74/7978 89:46 AM 2.41 (H) 0.61 - 1.24 mg/dL Final  94/81/7978 95:94 PM 2.41 (H) 0.61 - 1.24 mg/dL Final  94/90/7978 94:57 AM 2.19 (H) 0.61 - 1.24 mg/dL Final  94/91/7978 90:97 PM 2.27 (H) 0.61 - 1.24 mg/dL Final  94/91/7978 95:54 PM 2.11 (H) 0.61 - 1.24 mg/dL Final  94/91/7978 93:62 AM 2.20 (H) 0.61 - 1.24 mg/dL Final  94/92/7978 93:94 PM 2.24 (H) 0.61 - 1.24 mg/dL Final  92/71/7981 91:91 PM 1.59 (H) 0.61 - 1.24 mg/dL Final  95/94/7989 90:42 PM 2.1 (H) 0.4 - 1.5 mg/dL Final   Recent Labs  Lab 09/15/24 1846 09/15/24 1913 09/16/24 0250 09/17/24 0322 09/18/24 0319  NA 139 140 141 141 141  K 3.8 3.8 3.4* 3.6 3.6  CL 108 107 111 108 106  CO2 18*  --  18* 19* 20*  GLUCOSE 174* 167* 155* 283* 189*  BUN 41* 51* 40* 43* 44*  CREATININE 3.70* 3.90* 3.75* 3.98* 4.09*  CALCIUM 8.1*  --  8.2* 8.2* 8.5*   Recent Labs  Lab 09/15/24 1846 09/15/24 1913 09/16/24 0250  09/17/24 0322  WBC 10.3  --  10.0 8.7  HGB 8.8* 9.2* 8.2* 8.8*  HCT 27.1* 27.0* 24.6* 25.7*  MCV 85.8  --  84.2 82.4  PLT 280  --  262 284   Liver Function Tests: Recent Labs  Lab 09/15/24 1846  AST 31  ALT 34  ALKPHOS 82  BILITOT 1.0  PROT 6.3*  ALBUMIN 2.6*   No results for input(s): LIPASE, AMYLASE in the last 168 hours. No results for input(s): AMMONIA in the last 168 hours. Cardiac Enzymes: No results for input(s): CKTOTAL, CKMB, CKMBINDEX, TROPONINI in the last 168 hours. Iron Studies:  Recent Labs    09/17/24 0322  IRON 39*  TIBC 190*  FERRITIN 40   PT/INR: @LABRCNTIP (inr:5)  Xrays/Other Studies: ) Results for orders placed or performed during the hospital encounter of 09/15/24 (from the past 48 hours)  CBG monitoring, ED     Status: None   Collection Time: 09/16/24  7:49 AM  Result Value Ref Range   Glucose-Capillary 83 70 - 99 mg/dL    Comment: Glucose reference range applies only to samples taken after fasting for at least 8 hours.  CBG monitoring, ED     Status: Abnormal   Collection Time: 09/16/24 11:02 AM  Result Value Ref Range   Glucose-Capillary 112 (H) 70 - 99 mg/dL    Comment: Glucose reference range applies only to samples taken after fasting for at least 8 hours.  CBG monitoring, ED     Status: Abnormal   Collection Time: 09/16/24 11:52 AM  Result Value Ref Range   Glucose-Capillary 154 (H) 70 - 99 mg/dL    Comment: Glucose reference range applies only to samples taken after fasting for at least 8 hours.  CBG monitoring, ED     Status: Abnormal   Collection Time: 09/16/24  1:54 PM  Result Value Ref Range   Glucose-Capillary 132 (H) 70 - 99 mg/dL    Comment: Glucose reference range applies only to samples taken after fasting for at least 8 hours.   Comment 1 Document in Chart   Glucose, capillary     Status: Abnormal   Collection Time: 09/16/24  6:23 PM  Result Value Ref Range   Glucose-Capillary 220 (H) 70 - 99 mg/dL     Comment: Glucose reference range applies only to samples taken after fasting for at least 8 hours.  Glucose, capillary     Status: Abnormal   Collection Time: 09/16/24 10:33 PM  Result Value Ref Range   Glucose-Capillary 297 (H) 70 - 99 mg/dL    Comment: Glucose reference range applies only to samples taken after fasting for at least 8 hours.  Vitamin B12     Status: None   Collection Time: 09/17/24  3:22 AM  Result Value Ref Range   Vitamin B-12 443 180 - 914 pg/mL    Comment: (NOTE) This assay is not validated for testing neonatal or myeloproliferative syndrome specimens for Vitamin B12 levels. Performed at [facility], [address] [place], [place] [address]   Folate     Status: Abnormal   Collection Time: 09/17/24  3:22 AM  Result Value Ref Range   Folate 5.3 (L) >5.9 ng/mL    Comment: Performed at [facility], [address], [place], [place] [address]  Iron and TIBC     Status: Abnormal   Collection Time: 09/17/24  3:22 AM  Result Value Ref Range   Iron 39 (L) 45 - 182 ug/dL   TIBC 809 (L) 749 - 549 ug/dL   Saturation Ratios 21 17.9 - 39.5 %   UIBC 151 ug/dL    Comment: Performed at [facility], [address] [place], [place] [address]  Ferritin     Status: None   Collection Time: 09/17/24  3:22 AM  Result Value Ref Range   Ferritin 40 24 - 336 ng/mL    Comment: Performed at [facility], [address] [place], [place] [address]  Reticulocytes     Status: Abnormal   Collection Time: 09/17/24  3:22 AM  Result Value Ref Range  Retic Ct Pct 2.3 0.4 - 3.1 %   RBC. 3.10 (L) 4.22 - 5.81 MIL/uL   Retic Count, Absolute 71.9 19.0 - 186.0 K/uL   Immature Retic Fract 8.9 2.3 - 15.9 %    Comment: Performed at [facility], [address] [place], [place] [address]  CBC     Status: Abnormal   Collection Time: 09/17/24  3:22 AM  Result Value Ref Range   WBC 8.7 4.0 - 10.5 K/uL   RBC 3.12 (L) 4.22 - 5.81 MIL/uL   Hemoglobin 8.8 (L) 13.0  - 17.0 g/dL   HCT 74.2 (L) 60.9 - 47.9 %   MCV 82.4 80.0 - 100.0 fL   MCH 28.2 26.0 - 34.0 pg   MCHC 34.2 30.0 - 36.0 g/dL   RDW 84.0 (H) 88.4 - 84.4 %   Platelets 284 150 - 400 K/uL   nRBC 0.0 0.0 - 0.2 %    Comment: Performed at [facility], [address], [place], [place] [address]  Basic metabolic panel with GFR     Status: Abnormal   Collection Time: 09/17/24  3:22 AM  Result Value Ref Range   Sodium 141 135 - 145 mmol/L   Potassium 3.6 3.5 - 5.1 mmol/L   Chloride 108 98 - 111 mmol/L   CO2 19 (L) 22 - 32 mmol/L   Glucose, Bld 283 (H) 70 - 99 mg/dL    Comment: Glucose reference range applies only to samples taken after fasting for at least 8 hours.   BUN 43 (H) 8 - 23 mg/dL   Creatinine, Ser 6.01 (H) 0.61 - 1.24 mg/dL   Calcium 8.2 (L) 8.9 - 10.3 mg/dL   GFR, Estimated 16 (L) >60 mL/min    Comment: (NOTE) Calculated using the CKD-EPI Creatinine Equation (2021)    Anion gap 14 5 - 15    Comment: Performed at [facility], [address] [place], [place] [address]  Glucose, capillary     Status: Abnormal   Collection Time: 09/17/24  6:14 AM  Result Value Ref Range   Glucose-Capillary 243 (H) 70 - 99 mg/dL    Comment: Glucose reference range applies only to samples taken after fasting for at least 8 hours.   Comment 1 Notify RN    Comment 2 Document in Chart   Glucose, capillary     Status: Abnormal   Collection Time: 09/17/24 11:57 AM  Result Value Ref Range   Glucose-Capillary 278 (H) 70 - 99 mg/dL    Comment: Glucose reference range applies only to samples taken after fasting for at least 8 hours.   Comment 1 Notify RN    Comment 2 Document in Chart   Glucose, capillary     Status: Abnormal   Collection Time: 09/17/24  5:29 PM  Result Value Ref Range   Glucose-Capillary 174 (H) 70 - 99 mg/dL    Comment: Glucose reference range applies only to samples taken after fasting for at least 8 hours.   Comment 1 Notify RN    Comment 2 Document in Chart    Glucose, capillary     Status: Abnormal   Collection Time: 09/17/24  9:29 PM  Result Value Ref Range   Glucose-Capillary 175 (H) 70 - 99 mg/dL    Comment: Glucose reference range applies only to samples taken after fasting for at least 8 hours.   Comment 1 Notify RN    Comment 2 Document in Chart   Basic  metabolic panel with GFR     Status: Abnormal   Collection Time: 09/18/24  3:19 AM  Result Value Ref Range   Sodium 141 135 - 145 mmol/L   Potassium 3.6 3.5 - 5.1 mmol/L   Chloride 106 98 - 111 mmol/L   CO2 20 (L) 22 - 32 mmol/L   Glucose, Bld 189 (H) 70 - 99 mg/dL    Comment: Glucose reference range applies only to samples taken after fasting for at least 8 hours.   BUN 44 (H) 8 - 23 mg/dL   Creatinine, Ser 5.90 (H) 0.61 - 1.24 mg/dL   Calcium 8.5 (L) 8.9 - 10.3 mg/dL   GFR, Estimated 15 (L) >60 mL/min    Comment: (NOTE) Calculated using the CKD-EPI Creatinine Equation (2021)    Anion gap 15 5 - 15    Comment: Performed at [facility], [address] [place], [place] [address]  Glucose, capillary     Status: Abnormal   Collection Time: 09/18/24  6:45 AM  Result Value Ref Range   Glucose-Capillary 191 (H) 70 - 99 mg/dL    Comment: Glucose reference range applies only to samples taken after fasting for at least 8 hours.   No results found.   PMH:   Past Medical History:  Diagnosis Date   Acanthosis nigricans    Atopic dermatitis    CHF (congestive heart failure) (HCC)    CKD (chronic kidney disease) stage 4, GFR 15-29 ml/min (HCC) 01/08/2023   Diabetes (HCC) 11/19/2002   Erectile dysfunction    Fatty liver 11/19/2005   Glaucoma 11/19/2006   Gynecomastia 11/20/2007   Hx of adenomatous colonic polyps 08/26/2023   Hyperaldosteronism 11/19/1998   Hypercholesterolemia    Hyperlipidemia 2010   Hypertension 1999   Hypoglycemic reaction    Hypothyroidism 11/19/2002   Incontinence    Intermittent vertigo 11/19/2010   Left cervical radiculopathy 11/19/2010    Lumbar radiculopathy    Obesity    Peripheral neuropathy    Pollen allergies 11/19/2005   perennial   Prostate cancer (HCC) 11/19/2004   Reflux esophagitis 11/19/1993   Sickle cell trait 11/19/2004   Sleep apnea, obstructive 11/19/1998   uses a cpap   Venous insufficiency 11/20/2003   Vitamin D deficiency 11/19/2010    PSH:   Past Surgical History:  Procedure Laterality Date   COLONOSCOPY  2006   normal   INCISION AND DRAINAGE OF WOUND N/A 08/28/2024   Procedure: IRRIGATION AND EXCISIONAL DEBRIDEMENT WOUND;  Surgeon: Lyndel Deward PARAS, MD;  Location: [facility] OR;  Service: General;  Laterality: N/A;  EXCISIONAL DEBRIDEMENT   INCISION AND DRAINAGE PERIRECTAL ABSCESS N/A 08/30/2024   Procedure: INCISION AND DRAINAGE OF PERINEAL WOUND;  Surgeon: Polly Cordella LABOR, MD;  Location: [facility] OR;  Service: General;  Laterality: N/A;   lap band surgery  2009   left inguinal hernia repair  1998   LIPOMA EXCISION Left 04/17/2023   Procedure: MINOR EXCISION LEFT BUTTOCK SEBACEOUS CYST;  Surgeon: Lyndel Deward PARAS, MD;  Location: [facility];  Service: General;  Laterality: Left;   robotic prostatectomy  2008    Allergies:  Allergies  Allergen Reactions   Ace Inhibitors Cough   Maxidex [Dexamethasone] Other (See Comments)    Sexual dysfunction   Verapamil Other (See Comments)    constipation    Medications:   Prior to Admission medications   Medication Sig Start Date End Date Taking? Authorizing Provider  acetaminophen  (TYLENOL ) 500 MG tablet Take 500 mg  by mouth every 6 (six) hours as needed for mild pain.   Yes [provider]  amLODipine  (NORVASC ) 5 MG tablet Take 1 tablet (5 mg total) by mouth daily. 09/04/24 10/04/24 Yes Perri DELENA Meliton Mickey., MD  carvedilol  (COREG ) 25 MG tablet Take 1 tablet (25 mg total) by mouth 2 (two) times daily with food 04/01/24  Yes   Continuous Glucose Sensor (FREESTYLE LIBRE 3 PLUS SENSOR) MISC Apply to upper back of arm. Change every  15 days. 04/03/24  Yes   Continuous Glucose Sensor (FREESTYLE LIBRE 3 SENSOR) MISC Apply to upper back of arm and change ever 14 days. 01/28/24  Yes Faythe Purchase, MD  dorzolamide (TRUSOPT) 2 % ophthalmic solution Place 1 drop into both eyes daily.   Yes [provider]  hydrALAZINE  (APRESOLINE ) 100 MG tablet Take 1/2 tablet (50 mg total) by mouth 2 (two) times daily. 08/30/23  Yes Clegg, Amy D, NP  insulin  glargine (LANTUS  SOLOSTAR) 100 UNIT/ML Solostar Pen Inject 15 Units into the skin daily. Follow up with your PCP outpatient for further adjustment to your regimen 09/03/24  Yes Perri DELENA Meliton Mickey., MD  insulin  lispro (HUMALOG  KWIKPEN) 200 UNIT/ML KwikPen Inject 0-15 Units into the skin 3 (three) times daily with meals. CBG < 70: treat low blood sugar CBG 70 - 120: 0 units  CBG 121 - 150: 2 units  CBG 151 - 200: 3 units  CBG 201 - 250: 5 units  CBG 251 - 300: 8 units  CBG 301 - 350: 11 units  CBG 351 - 400: 15 units  CBG >400: Call MD 09/03/24  Yes Perri DELENA Meliton Mickey., MD  levothyroxine  (SYNTHROID ) 125 MCG tablet Take 1 tablet (125 mcg total) by mouth every morning on an empty stomach 06/08/24  Yes   simvastatin  (ZOCOR ) 20 MG tablet Take 1 tablet (20 mg total) by mouth every evening. 07/09/24  Yes   timolol  (BETIMOL ) 0.5 % ophthalmic solution Place 1 drop into both eyes daily.   Yes [provider]  tirzepatide  (MOUNJARO ) 15 MG/0.5ML Pen Inject 15 mg into the skin once a week. 04/14/24  Yes   torsemide  (DEMADEX ) 20 MG tablet Take 3 tablets (60 mg total) by mouth daily. Patient taking differently: Take 40 mg by mouth daily. 08/08/23 09/15/25 Yes Milford, Harlene HERO, FNP  clobetasol  ointment (TEMOVATE ) 0.05 % Apply topically daily before sleeping. Patient not taking: Reported on 08/29/2024 05/19/24     Finerenone  (KERENDIA ) 20 MG TABS Take 20 mg by mouth daily. Patient not taking: Reported on 09/15/2024    [provider]  Insulin  Pen Needle (TECHLITE PEN NEEDLES) 32G  X 4 MM MISC Use to inject insulin  4 times daily 10/25/22   Faythe Purchase, MD  Insulin  Pen Needle 32G X 4 MM MISC Use to inject insulin  up to 4 times daily. 12/26/23   Faythe Purchase, MD  potassium chloride  SA (KLOR-CON  M) 20 MEQ tablet Take 3 tablets (60 mEq total) by mouth daily. Patient not taking: Reported on 09/15/2024 03/10/24   Glena Harlene HERO, FNP  sacubitril -valsartan  (ENTRESTO ) 49-51 MG Take 1 tablet by mouth 2 (two) times daily. Patient not taking: Reported on 09/15/2024 08/25/24   Bensimhon, Toribio SAUNDERS, MD    Discontinued Meds:   Medications Discontinued During This Encounter  Medication Reason   albuterol  (VENTOLIN  HFA) 108 (90 Base) MCG/ACT inhaler 2 puff    insulin  glargine-yfgn (SEMGLEE ) injection 6 Units    timolol  (BETIMOL ) 0.5 % ophthalmic solution 1 drop  potassium chloride  (KLOR-CON  M) CR tablet 10 mEq    carvedilol  (COREG ) tablet 25 mg    dorzolamide (TRUSOPT) 2 % ophthalmic solution 1 drop    furosemide  (LASIX ) injection 80 mg     Social History:  reports that he has never smoked. He has never used smokeless tobacco. He reports current alcohol use. He reports that he does not use drugs.  Family History:   Family History  Problem Relation Age of Onset   Heart failure Mother    Hypertension Father        deceased age 10   Heart attack Father    COPD Sister    CVA Sister    Diabetes Daughter    Colon cancer Neg Hx    Stomach cancer Neg Hx    Colon polyps Neg Hx    Esophageal cancer Neg Hx    Rectal cancer Neg Hx     Blood pressure (!) 157/95, pulse 73, temperature (!) 97.5 F (36.4 C), temperature source Oral, resp. rate 18, height 6' 2 (1.88 m), weight (!) 138.5 kg, SpO2 100%. Physical Exam: GEN: NAD, A&Ox3, NCAT, obese HEENT: No conjunctival pallor, EOMI NECK: Supple, no thyromegaly LUNGS: CTA B/L no rales, rhonchi or wheezing CV: RRR, No M/R/G ABD: SNDNT +BS  EXT: 1+ lower extremity edema GU: male purewick    MELIA LYNWOOD ORN, MD 09/18/2024, 7:21  AM

## 2024-09-18 NOTE — Sedation Documentation (Signed)
 Pt arrived to IR on 6L oxygen with audible wheezing, endorsing intermittent SOB when speaking.  On assessment, IV lasix  AM dose still present in secondary bag with clamped line. Per DO Swayze, okay to give dose at this time. See MAR.

## 2024-09-18 NOTE — Plan of Care (Signed)
  Problem: Health Behavior/Discharge Planning: Goal: Ability to identify and utilize available resources and services will improve Outcome: Progressing   Problem: Metabolic: Goal: Ability to maintain appropriate glucose levels will improve Outcome: Progressing   Problem: Nutritional: Goal: Maintenance of adequate nutrition will improve Outcome: Progressing   Problem: Coping: Goal: Ability to adjust to condition or change in health will improve Outcome: Not Progressing   Problem: Fluid Volume: Goal: Ability to maintain a balanced intake and output will improve Outcome: Not Progressing   Problem: Health Behavior/Discharge Planning: Goal: Ability to manage health-related needs will improve Outcome: Not Progressing   Problem: Nutritional: Goal: Progress toward achieving an optimal weight will improve Outcome: Not Progressing   Problem: Skin Integrity: Goal: Risk for impaired skin integrity will decrease Outcome: Not Progressing

## 2024-09-18 NOTE — Procedures (Signed)
 Interventional Radiology Procedure Note  Procedure: Non-tunneled HD catheter placement  Complications: None  Estimated Blood Loss: < 10 mL  Findings: Patient in respiratory distress in IR and had to be placed on NRB. Would not be safe to sedate for tunneled catheter today.  16 cm triple lumen Mahurkar catheter placed with tip in lower SVC. OK to use.  Marcey DASEN. Luverne, M.D Pager:  [phone]

## 2024-09-18 NOTE — Procedures (Signed)
 Note that states OK to use internal jugular catheter by Luverne Aran, MD .  HD Note:  Some information was entered later than the data was gathered due to patient care needs. The stated time with the data is accurate.  Received patient in bed to unit.   Alert and oriented.   Informed consent signed and in chart.   Access used: New right neck internal jugular trialysis catheter Access issues: None  Patient tolerated treatment well.   TX duration: 3 hours  Alert, without acute distress.  Patient stated that he felt he was breathing more easily at the end of the treatment.  Total UF removed: 3000 ml  Hand-off given to patient's nurse.   Transported back to the room   Adelaido Nicklaus L. Lenon, RN [facility] Unit.

## 2024-09-19 DIAGNOSIS — I5033 Acute on chronic diastolic (congestive) heart failure: Secondary | ICD-10-CM | POA: Diagnosis not present

## 2024-09-19 DIAGNOSIS — E109 Type 1 diabetes mellitus without complications: Secondary | ICD-10-CM | POA: Diagnosis not present

## 2024-09-19 LAB — GLUCOSE, CAPILLARY
Glucose-Capillary: 319 mg/dL — ABNORMAL HIGH (ref 70–99)
Glucose-Capillary: 156 mg/dL — ABNORMAL HIGH (ref 70–99)
Glucose-Capillary: 285 mg/dL — ABNORMAL HIGH (ref 70–99)
Glucose-Capillary: 279 mg/dL — ABNORMAL HIGH (ref 70–99)

## 2024-09-19 LAB — CBC WITH DIFFERENTIAL/PLATELET
Abs Immature Granulocytes: 0.04 K/uL (ref 0.00–0.07)
Basophils Absolute: 0.1 K/uL (ref 0.0–0.1)
Basophils Relative: 1 %
Eosinophils Absolute: 1.2 K/uL — ABNORMAL HIGH (ref 0.0–0.5)
Eosinophils Relative: 13 %
HCT: 24.5 % — ABNORMAL LOW (ref 39.0–52.0)
Hemoglobin: 8.2 g/dL — ABNORMAL LOW (ref 13.0–17.0)
Immature Granulocytes: 0 %
Lymphocytes Relative: 11 %
Lymphs Abs: 1.1 K/uL (ref 0.7–4.0)
MCH: 28.1 pg (ref 26.0–34.0)
MCHC: 33.5 g/dL (ref 30.0–36.0)
MCV: 83.9 fL (ref 80.0–100.0)
Monocytes Absolute: 0.8 K/uL (ref 0.1–1.0)
Monocytes Relative: 9 %
Neutro Abs: 6.1 K/uL (ref 1.7–7.7)
Neutrophils Relative %: 66 %
Platelets: 278 K/uL (ref 150–400)
RBC: 2.92 MIL/uL — ABNORMAL LOW (ref 4.22–5.81)
RDW: 16 % — ABNORMAL HIGH (ref 11.5–15.5)
WBC: 9.3 K/uL (ref 4.0–10.5)
nRBC: 0 % (ref 0.0–0.2)

## 2024-09-19 LAB — BASIC METABOLIC PANEL WITH GFR
Anion gap: 13 (ref 5–15)
BUN: 33 mg/dL — ABNORMAL HIGH (ref 8–23)
CO2: 23 mmol/L (ref 22–32)
Calcium: 7.8 mg/dL — ABNORMAL LOW (ref 8.9–10.3)
Chloride: 101 mmol/L (ref 98–111)
Creatinine, Ser: 3.59 mg/dL — ABNORMAL HIGH (ref 0.61–1.24)
GFR, Estimated: 18 mL/min — ABNORMAL LOW (ref >60)
Glucose, Bld: 334 mg/dL — ABNORMAL HIGH (ref 70–99)
Potassium: 3.8 mmol/L (ref 3.5–5.1)
Sodium: 137 mmol/L (ref 135–145)

## 2024-09-19 LAB — HEPATITIS B SURFACE ANTIBODY, QUANTITATIVE: Hep B S AB Quant (Post): 1777 m[IU]/mL

## 2024-09-19 MED ORDER — FUROSEMIDE 10 MG/ML IJ SOLN
120.0000 mg | Freq: Two times a day (BID) | INTRAVENOUS | Status: DC
  Administered 2024-09-19 – 2024-09-22 (×6): 120 mg via INTRAVENOUS
  Filled 2024-09-19: qty 120
  Filled 2024-09-19 (×2): qty 12
  Filled 2024-09-19: qty 120
  Filled 2024-09-19: qty 12
  Filled 2024-09-19: qty 120
  Filled 2024-09-19 (×2): qty 12
  Filled 2024-09-19: qty 10

## 2024-09-19 MED ORDER — INSULIN GLARGINE-YFGN 100 UNIT/ML ~~LOC~~ SOLN
10.0000 [IU] | Freq: Every day | SUBCUTANEOUS | Status: DC
  Administered 2024-09-20: 10 [IU] via SUBCUTANEOUS
  Filled 2024-09-19 (×2): qty 0.1

## 2024-09-19 NOTE — Progress Notes (Signed)
 Blayke Cordrey. is an 68 y.o. male HFpEF, type 2 diabetes, hypertension, hyperlipidemia, hypothyroidism, prostate cancer, anemia of chronic disease, OSA on CPAP, CKD 4 followed by Dr. Marlee last seen in early October.  Patient recently admitted with necrotizing fasciitis of the scrotum/perineum status post surgical debridement on 10/10 as well as 10/12 with culture growing multipole organisms. treated with Unasyn as well as a outpatient 2-week course of Augmentin .  Farxiga  was discontinued at time of discharge as his creatinine was slightly elevated compared to baseline which is approximately 3.  Of note finerenone , Entresto  and torsemide  were held at time of discharge.  Unfortunately he has had a 18 pound weight gain since discharge with worsening shortness of breath past couple days before presentation especially with exertion, increased swelling in the lower extremities.  Torsemide  was restarted last Friday (5 days prior to presentation) by Dr. Marlee. In the ED BP was elevated Cr 3.7 BNP 729 and CXR showed pulmonary vasc congestion.   Assessment/Plan: Acute on CKD IV followed by Dr. Marlee. Concerned he may have progressed and is now CKDV with dialysis unfortunately possible in the near future. - Unfortunately very poor response to high dose diuresis; I d/w the pt at length the need to start HD. He is markedly overloaded;  PVR 0 on 10/30. - HD #1 on 10/31 with 3L UF Seen on HD 11/1 with goal 2.5L 99/58 RIJ temp  Will CLIP on Mon for AKI (likely ESRD but can give him a mth); d/w Dr. Rolan and low likelihood of coming off dialysis.  Next dialysis on Monday and TC on Tues.  [employer] boots to help mobilize the fluid -> placed AM 10/31. - Renal u/s RK 8.5cm LK 9.3cm with no e/o hydronephrosis   -Monitor Daily I/Os, Daily weight  -Maintain MAP>65 for optimal renal perfusion.  - Avoid nephrotoxic agents such as IV contrast, NSAIDs, and phosphate containing bowel preps (FLEETS)   HFpEF EF 60-65% -  followed by Dr. Bensimhon, restarted on Torsemide  40mg  daily last Friday by Dr. Marlee with initially good response; pt states ankle edema initially got better. Hypertension - initially but now much better controlled borderline relative hypotension for him. T2DM on SSI and long acting insulin  being managed by the primary. Anemia -will check iron panels but with possible active infection would not like to use IV iron.  With active inflammation ESA's also will not be very effective. OSA on CPAP  Subjective: Poor response to high dose diuretics, sob much improved since starting dialysis.  PVR 0 on 10/30. Denies f/c/n/v.   Chemistry and CBC: Creatinine, Ser  Date/Time Value Ref Range Status  09/19/2024 06:33 AM 3.59 (H) 0.61 - 1.24 mg/dL Final  89/68/7974 96:80 AM 4.09 (H) 0.61 - 1.24 mg/dL Final  89/69/7974 96:77 AM 3.98 (H) 0.61 - 1.24 mg/dL Final  89/70/7974 97:49 AM 3.75 (H) 0.61 - 1.24 mg/dL Final  89/71/7974 92:86 PM 3.90 (H) 0.61 - 1.24 mg/dL Final  89/71/7974 93:53 PM 3.70 (H) 0.61 - 1.24 mg/dL Final  89/83/7974 95:40 AM 3.63 (H) 0.61 - 1.24 mg/dL Final  89/84/7974 98:51 AM 3.55 (H) 0.61 - 1.24 mg/dL Final  89/85/7974 96:57 AM 3.81 (H) 0.61 - 1.24 mg/dL Final  89/86/7974 95:73 AM 3.52 (H) 0.61 - 1.24 mg/dL Final  89/88/7974 96:73 AM 3.55 (H) 0.61 - 1.24 mg/dL Final  89/89/7974 95:86 PM 3.79 (H) 0.61 - 1.24 mg/dL Final  94/91/7974 89:72 AM 3.08 (H) 0.61 - 1.24 mg/dL Final  95/77/7974 87:86 PM 2.88 (  H) 0.61 - 1.24 mg/dL Final  96/96/7974 89:57 AM 3.09 (H) 0.61 - 1.24 mg/dL Final  91/77/7975 88:56 AM 2.68 (H) 0.61 - 1.24 mg/dL Final  93/96/7975 88:81 AM 2.74 (H) 0.61 - 1.24 mg/dL Final  94/77/7975 97:45 PM 2.83 (H) 0.61 - 1.24 mg/dL Final  94/89/7975 96:61 PM 2.82 (H) 0.61 - 1.24 mg/dL Final  94/96/7975 90:86 AM 2.75 (H) 0.61 - 1.24 mg/dL Final  95/80/7975 87:49 PM 3.08 (H) 0.61 - 1.24 mg/dL Final  95/90/7975 97:46 PM 2.84 (H) 0.61 - 1.24 mg/dL Final  96/70/7975 88:62 AM 2.88 (H)  0.61 - 1.24 mg/dL Final  96/88/7975 91:41 AM 2.91 (H) 0.61 - 1.24 mg/dL Final  97/71/7975 98:46 PM 3.45 (H) 0.61 - 1.24 mg/dL Final  97/76/7975 97:59 PM 2.93 (H) 0.61 - 1.24 mg/dL Final  97/76/7975 93:87 AM 2.78 (H) 0.61 - 1.24 mg/dL Final  97/77/7975 95:51 PM 2.63 (H) 0.61 - 1.24 mg/dL Final  97/77/7975 92:94 AM 2.47 (H) 0.61 - 1.24 mg/dL Final  97/78/7975 96:72 PM 2.32 (H) 0.61 - 1.24 mg/dL Final  97/78/7975 93:49 AM 2.36 (H) 0.61 - 1.24 mg/dL Final  97/78/7975 98:67 AM 2.36 (H) 0.61 - 1.24 mg/dL Final  97/79/7975 95:99 PM 2.23 (H) 0.61 - 1.24 mg/dL Final  97/79/7975 98:70 AM 2.43 (H) 0.61 - 1.24 mg/dL Final  97/80/7975 92:81 PM 2.43 (H) 0.61 - 1.24 mg/dL Final  89/83/7976 91:50 AM 2.17 (H) 0.76 - 1.27 mg/dL Final  90/78/7976 96:75 PM 2.14 (H) 0.76 - 1.27 mg/dL Final  93/96/7976 90:64 AM 2.90 (H) 0.61 - 1.24 mg/dL Final  96/91/7976 88:92 AM 2.46 (H) 0.76 - 1.27 mg/dL Final  89/70/7978 90:60 AM 2.26 (H) 0.61 - 1.24 mg/dL Final  89/92/7978 88:55 AM 2.57 (H) 0.76 - 1.27 mg/dL Final  91/87/7978 89:79 AM 2.68 (H) 0.61 - 1.24 mg/dL Final  91/94/7978 90:82 AM 2.71 (H) 0.61 - 1.24 mg/dL Final  92/70/7978 87:77 PM 2.32 (H) 0.61 - 1.24 mg/dL Final  94/74/7978 89:46 AM 2.41 (H) 0.61 - 1.24 mg/dL Final  94/81/7978 95:94 PM 2.41 (H) 0.61 - 1.24 mg/dL Final  94/90/7978 94:57 AM 2.19 (H) 0.61 - 1.24 mg/dL Final  94/91/7978 90:97 PM 2.27 (H) 0.61 - 1.24 mg/dL Final  94/91/7978 95:54 PM 2.11 (H) 0.61 - 1.24 mg/dL Final  94/91/7978 93:62 AM 2.20 (H) 0.61 - 1.24 mg/dL Final  94/92/7978 93:94 PM 2.24 (H) 0.61 - 1.24 mg/dL Final  92/71/7981 91:91 PM 1.59 (H) 0.61 - 1.24 mg/dL Final   Recent Labs  Lab 09/15/24 1846 09/15/24 1913 09/16/24 0250 09/17/24 0322 09/18/24 0319 09/19/24 0633  NA 139 140 141 141 141 137  K 3.8 3.8 3.4* 3.6 3.6 3.8  CL 108 107 111 108 106 101  CO2 18*  --  18* 19* 20* 23  GLUCOSE 174* 167* 155* 283* 189* 334*  BUN 41* 51* 40* 43* 44* 33*  CREATININE 3.70* 3.90* 3.75*  3.98* 4.09* 3.59*  CALCIUM 8.1*  --  8.2* 8.2* 8.5* 7.8*   Recent Labs  Lab 09/16/24 0250 09/17/24 0322 09/18/24 1541 09/19/24 0633  WBC 10.0 8.7 8.9 9.3  NEUTROABS  --   --   --  6.1  HGB 8.2* 8.8* 8.5* 8.2*  HCT 24.6* 25.7* 25.9* 24.5*  MCV 84.2 82.4 85.2 83.9  PLT 262 284 287 278   Liver Function Tests: Recent Labs  Lab 09/15/24 1846  AST 31  ALT 34  ALKPHOS 82  BILITOT 1.0  PROT 6.3*  ALBUMIN 2.6*   No  results for input(s): LIPASE, AMYLASE in the last 168 hours. No results for input(s): AMMONIA in the last 168 hours. Cardiac Enzymes: No results for input(s): CKTOTAL, CKMB, CKMBINDEX, TROPONINI in the last 168 hours. Iron Studies:  Recent Labs    09/17/24 0322  IRON 39*  TIBC 190*  FERRITIN 40   PT/INR: @LABRCNTIP (inr:5)  Xrays/Other Studies: ) Results for orders placed or performed during the hospital encounter of 09/15/24 (from the past 48 hours)  Glucose, capillary     Status: Abnormal   Collection Time: 09/17/24 11:57 AM  Result Value Ref Range   Glucose-Capillary 278 (H) 70 - 99 mg/dL    Comment: Glucose reference range applies only to samples taken after fasting for at least 8 hours.   Comment 1 Notify RN    Comment 2 Document in Chart   Glucose, capillary     Status: Abnormal   Collection Time: 09/17/24  5:29 PM  Result Value Ref Range   Glucose-Capillary 174 (H) 70 - 99 mg/dL    Comment: Glucose reference range applies only to samples taken after fasting for at least 8 hours.   Comment 1 Notify RN    Comment 2 Document in Chart   Glucose, capillary     Status: Abnormal   Collection Time: 09/17/24  9:29 PM  Result Value Ref Range   Glucose-Capillary 175 (H) 70 - 99 mg/dL    Comment: Glucose reference range applies only to samples taken after fasting for at least 8 hours.   Comment 1 Notify RN    Comment 2 Document in Chart   Basic metabolic panel with GFR     Status: Abnormal   Collection Time: 09/18/24  3:19 AM  Result Value  Ref Range   Sodium 141 135 - 145 mmol/L   Potassium 3.6 3.5 - 5.1 mmol/L   Chloride 106 98 - 111 mmol/L   CO2 20 (L) 22 - 32 mmol/L   Glucose, Bld 189 (H) 70 - 99 mg/dL    Comment: Glucose reference range applies only to samples taken after fasting for at least 8 hours.   BUN 44 (H) 8 - 23 mg/dL   Creatinine, Ser 5.90 (H) 0.61 - 1.24 mg/dL   Calcium 8.5 (L) 8.9 - 10.3 mg/dL   GFR, Estimated 15 (L) >60 mL/min    Comment: (NOTE) Calculated using the CKD-EPI Creatinine Equation (2021)    Anion gap 15 5 - 15    Comment: Performed at [facility], [address], [place], [place] [address]  Glucose, capillary     Status: Abnormal   Collection Time: 09/18/24  6:45 AM  Result Value Ref Range   Glucose-Capillary 191 (H) 70 - 99 mg/dL    Comment: Glucose reference range applies only to samples taken after fasting for at least 8 hours.  Hepatitis B surface antibody,quantitative     Status: None   Collection Time: 09/18/24 12:29 PM  Result Value Ref Range   Hep B S AB Quant (Post) 1,777.0 Immunity>10 mIU/mL    Comment: (NOTE) Results confirmed on dilution.  Status of Immunity                     Anti-HBs Level  ------------------                     -------------- Inconsistent with Immunity                  0.0 - 10.0  Consistent with Immunity                         >10.0 Performed At: [facility] [address] [place], [place] [id number] Jennette Shorter MD Ey:[id number]   Hepatitis B surface antigen     Status: None   Collection Time: 09/18/24 12:29 PM  Result Value Ref Range   Hepatitis B Surface Ag NON REACTIVE NON REACTIVE    Comment: Performed at [facility], [address] [place], [place] [address]  Glucose, capillary     Status: Abnormal   Collection Time: 09/18/24 12:47 PM  Result Value Ref Range   Glucose-Capillary 185 (H) 70 - 99 mg/dL    Comment: Glucose reference range applies only to samples taken after fasting for at least 8 hours.  CBC      Status: Abnormal   Collection Time: 09/18/24  3:41 PM  Result Value Ref Range   WBC 8.9 4.0 - 10.5 K/uL   RBC 3.04 (L) 4.22 - 5.81 MIL/uL   Hemoglobin 8.5 (L) 13.0 - 17.0 g/dL   HCT 74.0 (L) 60.9 - 47.9 %   MCV 85.2 80.0 - 100.0 fL   MCH 28.0 26.0 - 34.0 pg   MCHC 32.8 30.0 - 36.0 g/dL   RDW 83.8 (H) 88.4 - 84.4 %   Platelets 287 150 - 400 K/uL   nRBC 0.0 0.0 - 0.2 %    Comment: Performed at [facility], [address] [place], [place] [address]  Glucose, capillary     Status: Abnormal   Collection Time: 09/18/24  9:34 PM  Result Value Ref Range   Glucose-Capillary 196 (H) 70 - 99 mg/dL    Comment: Glucose reference range applies only to samples taken after fasting for at least 8 hours.   Comment 1 Notify RN    Comment 2 Document in Chart   Basic metabolic panel with GFR     Status: Abnormal   Collection Time: 09/19/24  6:33 AM  Result Value Ref Range   Sodium 137 135 - 145 mmol/L   Potassium 3.8 3.5 - 5.1 mmol/L   Chloride 101 98 - 111 mmol/L   CO2 23 22 - 32 mmol/L   Glucose, Bld 334 (H) 70 - 99 mg/dL    Comment: Glucose reference range applies only to samples taken after fasting for at least 8 hours.   BUN 33 (H) 8 - 23 mg/dL   Creatinine, Ser 6.40 (H) 0.61 - 1.24 mg/dL   Calcium 7.8 (L) 8.9 - 10.3 mg/dL   GFR, Estimated 18 (L) >60 mL/min    Comment: (NOTE) Calculated using the CKD-EPI Creatinine Equation (2021)    Anion gap 13 5 - 15    Comment: Performed at [facility], [address], [place], [place] [address]  CBC with Differential/Platelet     Status: Abnormal   Collection Time: 09/19/24  6:33 AM  Result Value Ref Range   WBC 9.3 4.0 - 10.5 K/uL   RBC 2.92 (L) 4.22 - 5.81 MIL/uL   Hemoglobin 8.2 (L) 13.0 - 17.0 g/dL   HCT 75.4 (L) 60.9 - 47.9 %   MCV 83.9 80.0 - 100.0 fL   MCH 28.1 26.0 - 34.0 pg   MCHC 33.5 30.0 - 36.0 g/dL   RDW 83.9 (H) 88.4 - 84.4 %   Platelets 278 150 - 400 K/uL   nRBC 0.0 0.0 - 0.2 %  Neutrophils Relative % 66 %    Neutro Abs 6.1 1.7 - 7.7 K/uL   Lymphocytes Relative 11 %   Lymphs Abs 1.1 0.7 - 4.0 K/uL   Monocytes Relative 9 %   Monocytes Absolute 0.8 0.1 - 1.0 K/uL   Eosinophils Relative 13 %   Eosinophils Absolute 1.2 (H) 0.0 - 0.5 K/uL   Basophils Relative 1 %   Basophils Absolute 0.1 0.0 - 0.1 K/uL   Immature Granulocytes 0 %   Abs Immature Granulocytes 0.04 0.00 - 0.07 K/uL    Comment: Performed at [facility], [address], [place], [place] [address]  Glucose, capillary     Status: Abnormal   Collection Time: 09/19/24  7:02 AM  Result Value Ref Range   Glucose-Capillary 319 (H) 70 - 99 mg/dL    Comment: Glucose reference range applies only to samples taken after fasting for at least 8 hours.   Comment 1 Notify RN    Comment 2 Document in Chart    IR NON-TUNNELED CENTRAL VENOUS CATH [facility] W IMG Result Date: 09/18/2024 CLINICAL DATA:  Worsening chronic kidney disease now requiring hemodialysis. EXAM: NON-TUNNELED CENTRAL VENOUS HEMODIALYSIS CATHETER PLACEMENT WITH ULTRASOUND AND FLUOROSCOPIC GUIDANCE FLUOROSCOPY: Radiation Exposure Index: 4.3 mGy Kerma PROCEDURE: The procedure, risks, benefits, and alternatives were explained to the patient. Questions regarding the procedure were encouraged and answered. The patient understands and consents to the procedure. A time out was performed prior to initiating the procedure. Ultrasound was used to confirm patency of the right internal jugular vein. An ultrasound image was saved and recorded. The right neck and chest were prepped with chlorhexidine  in a sterile fashion, and a sterile drape was applied covering the operative field. Maximum barrier sterile technique with sterile gowns and gloves were used for the procedure. Local anesthesia was provided with 1% lidocaine . After creating a small venotomy incision, a 21 gauge needle was advanced into the right internal jugular vein under direct, real-time ultrasound guidance. Ultrasound image documentation  was performed. After securing guidewire access,the venotomy was dilated over a guide wire. A 16 cm, 13 Fr, triple lumen non-tunneled dialysis catheter was advanced over the wire. Final catheter positioning was confirmed and documented with a fluoroscopic spot image. The catheter was aspirated, flushed with saline, and injected with appropriate volume heparin  dwells. The catheter exit site was secured with 0-Prolene retention sutures. COMPLICATIONS: None.  No pneumothorax. FINDINGS: Initial plan was to place a tunneled hemodialysis catheter. However, when the patient arrived to the department, he was in significant respiratory distress with high oxygen requirement and required a non-rebreather mask in order to maintain adequate oxygenation. A tunneled catheter could not be placed as the patient could not be safely sedated due to his clinical status. After the non tunneled catheter placement, the tip lies at the cavoatrial junction. The catheter aspirates normally and is ready for immediate use. IMPRESSION: Placement of non-tunneled central venous hemodialysis catheter via the right internal jugular vein. The catheter tip lies at the cavoatrial junction. The catheter is ready for immediate use. Electronically Signed   By: Marcey Moan M.D.   On: 09/18/2024 20:32     PMH:   Past Medical History:  Diagnosis Date   Acanthosis nigricans    Atopic dermatitis    CHF (congestive heart failure) (HCC)    CKD (chronic kidney disease) stage 4, GFR 15-29 ml/min (HCC) 01/08/2023   Diabetes (HCC) 11/19/2002   Erectile dysfunction    Fatty liver 11/19/2005   Glaucoma  11/19/2006   Gynecomastia 11/20/2007   Hx of adenomatous colonic polyps 08/26/2023   Hyperaldosteronism 11/19/1998   Hypercholesterolemia    Hyperlipidemia 2010   Hypertension 1999   Hypoglycemic reaction    Hypothyroidism 11/19/2002   Incontinence    Intermittent vertigo 11/19/2010   Left cervical radiculopathy 11/19/2010   Lumbar  radiculopathy    Obesity    Peripheral neuropathy    Pollen allergies 11/19/2005   perennial   Prostate cancer (HCC) 11/19/2004   Reflux esophagitis 11/19/1993   Sickle cell trait 11/19/2004   Sleep apnea, obstructive 11/19/1998   uses a cpap   Venous insufficiency 11/20/2003   Vitamin D deficiency 11/19/2010    PSH:   Past Surgical History:  Procedure Laterality Date   COLONOSCOPY  2006   normal   INCISION AND DRAINAGE OF WOUND N/A 08/28/2024   Procedure: IRRIGATION AND EXCISIONAL DEBRIDEMENT WOUND;  Surgeon: Lyndel Deward PARAS, MD;  Location: [facility] OR;  Service: General;  Laterality: N/A;  EXCISIONAL DEBRIDEMENT   INCISION AND DRAINAGE PERIRECTAL ABSCESS N/A 08/30/2024   Procedure: INCISION AND DRAINAGE OF PERINEAL WOUND;  Surgeon: Polly Cordella LABOR, MD;  Location: [facility] OR;  Service: General;  Laterality: N/A;   IR TUNNELED CENTRAL VENOUS CATH [facility] W IMG  09/18/2024   lap band surgery  2009   left inguinal hernia repair  1998   LIPOMA EXCISION Left 04/17/2023   Procedure: MINOR EXCISION LEFT BUTTOCK SEBACEOUS CYST;  Surgeon: Lyndel Deward PARAS, MD;  Location: [facility];  Service: General;  Laterality: Left;   robotic prostatectomy  2008    Allergies:  Allergies  Allergen Reactions   Ace Inhibitors Cough   Maxidex [Dexamethasone] Other (See Comments)    Sexual dysfunction   Verapamil Other (See Comments)    constipation    Medications:   Prior to Admission medications   Medication Sig Start Date End Date Taking? Authorizing Provider  acetaminophen  (TYLENOL ) 500 MG tablet Take 500 mg by mouth every 6 (six) hours as needed for mild pain.   Yes [provider]  amLODipine  (NORVASC ) 5 MG tablet Take 1 tablet (5 mg total) by mouth daily. 09/04/24 10/04/24 Yes Perri LABOR Meliton Mickey., MD  carvedilol  (COREG ) 25 MG tablet Take 1 tablet (25 mg total) by mouth 2 (two) times daily with food 04/01/24  Yes   Continuous Glucose Sensor (FREESTYLE LIBRE 3 PLUS  SENSOR) MISC Apply to upper back of arm. Change every 15 days. 04/03/24  Yes   Continuous Glucose Sensor (FREESTYLE LIBRE 3 SENSOR) MISC Apply to upper back of arm and change ever 14 days. 01/28/24  Yes Faythe Purchase, MD  dorzolamide (TRUSOPT) 2 % ophthalmic solution Place 1 drop into both eyes daily.   Yes [provider]  hydrALAZINE  (APRESOLINE ) 100 MG tablet Take 1/2 tablet (50 mg total) by mouth 2 (two) times daily. 08/30/23  Yes Clegg, Amy D, NP  insulin  glargine (LANTUS  SOLOSTAR) 100 UNIT/ML Solostar Pen Inject 15 Units into the skin daily. Follow up with your PCP outpatient for further adjustment to your regimen 09/03/24  Yes Perri LABOR Meliton Mickey., MD  insulin  lispro (HUMALOG  KWIKPEN) 200 UNIT/ML KwikPen Inject 0-15 Units into the skin 3 (three) times daily with meals. CBG < 70: treat low blood sugar CBG 70 - 120: 0 units  CBG 121 - 150: 2 units  CBG 151 - 200: 3 units  CBG 201 - 250: 5 units  CBG 251 - 300: 8 units  CBG 301 - 350:  11 units  CBG 351 - 400: 15 units  CBG >400: Call MD 09/03/24  Yes Perri DELENA Meliton Mickey., MD  levothyroxine  (SYNTHROID ) 125 MCG tablet Take 1 tablet (125 mcg total) by mouth every morning on an empty stomach 06/08/24  Yes   simvastatin  (ZOCOR ) 20 MG tablet Take 1 tablet (20 mg total) by mouth every evening. 07/09/24  Yes   timolol  (BETIMOL ) 0.5 % ophthalmic solution Place 1 drop into both eyes daily.   Yes [provider]  tirzepatide  (MOUNJARO ) 15 MG/0.5ML Pen Inject 15 mg into the skin once a week. 04/14/24  Yes   torsemide  (DEMADEX ) 20 MG tablet Take 3 tablets (60 mg total) by mouth daily. Patient taking differently: Take 40 mg by mouth daily. 08/08/23 09/15/25 Yes Milford, Harlene HERO, FNP  clobetasol  ointment (TEMOVATE ) 0.05 % Apply topically daily before sleeping. Patient not taking: Reported on 08/29/2024 05/19/24     Finerenone  (KERENDIA ) 20 MG TABS Take 20 mg by mouth daily. Patient not taking: Reported on 09/15/2024    [provider]  Insulin  Pen Needle (TECHLITE PEN NEEDLES) 32G X 4 MM MISC Use to inject insulin  4 times daily 10/25/22   Faythe Purchase, MD  Insulin  Pen Needle 32G X 4 MM MISC Use to inject insulin  up to 4 times daily. 12/26/23   Faythe Purchase, MD  potassium chloride  SA (KLOR-CON  M) 20 MEQ tablet Take 3 tablets (60 mEq total) by mouth daily. Patient not taking: Reported on 09/15/2024 03/10/24   Glena Harlene HERO, FNP  sacubitril -valsartan  (ENTRESTO ) 49-51 MG Take 1 tablet by mouth 2 (two) times daily. Patient not taking: Reported on 09/15/2024 08/25/24   Bensimhon, Toribio SAUNDERS, MD    Discontinued Meds:   Medications Discontinued During This Encounter  Medication Reason   albuterol  (VENTOLIN  HFA) 108 (90 Base) MCG/ACT inhaler 2 puff    insulin  glargine-yfgn (SEMGLEE ) injection 6 Units    timolol  (BETIMOL ) 0.5 % ophthalmic solution 1 drop    potassium chloride  (KLOR-CON  M) CR tablet 10 mEq    carvedilol  (COREG ) tablet 25 mg    dorzolamide (TRUSOPT) 2 % ophthalmic solution 1 drop    furosemide  (LASIX ) injection 80 mg    furosemide  (LASIX ) 120 mg in dextrose  5 % 50 mL IVPB    pentafluoroprop-tetrafluoroeth (GEBAUERS) aerosol 1 Application Patient Transfer   lidocaine  (PF) (XYLOCAINE ) 1 % injection 5 mL Patient Transfer   lidocaine -prilocaine (EMLA) cream 1 Application Patient Transfer   heparin  injection 1,000 Units Patient Transfer   anticoagulant sodium citrate solution 5 mL Patient Transfer   alteplase (CATHFLO ACTIVASE) injection 2 mg Patient Transfer   furosemide  (LASIX ) 120 mg in dextrose  5 % 50 mL IVPB     Social History:  reports that he has never smoked. He has never used smokeless tobacco. He reports current alcohol use. He reports that he does not use drugs.  Family History:   Family History  Problem Relation Age of Onset   Heart failure Mother    Hypertension Father        deceased age 69   Heart attack Father    COPD Sister    CVA Sister    Diabetes Daughter    Colon cancer  Neg Hx    Stomach cancer Neg Hx    Colon polyps Neg Hx    Esophageal cancer Neg Hx    Rectal cancer Neg Hx     Blood pressure 110/60, pulse 67, temperature 98.2 F (36.8 C), resp. rate 11, height 6'  2 (1.88 m), weight 135.1 kg, SpO2 96%. Physical Exam: GEN: NAD, A&Ox3, NCAT, obese HEENT: No conjunctival pallor, EOMI NECK: Supple, no thyromegaly LUNGS: CTA B/L no rales, rhonchi or wheezing CV: RRR, No M/R/G ABD: SNDNT +BS  EXT: 1+ lower extremity edema GU: male purewick ACCESS: RIJ temp    MELIA LYNWOOD ORN, MD 09/19/2024, 9:36 AM

## 2024-09-19 NOTE — Progress Notes (Signed)
 PROGRESS NOTE    Brendan [name].  FMW:[id number] DOB: February 14, 1956 DOA: 09/15/2024 PCP: Rexanne Ingle, MD   68/M with chronic diastolic CHF, CKD 4, T2DM, HTN, HLD, hypothyroidism, prostate cancer, anemia of chronic disease, OSA on CPAP who presented to the ED for evaluation of shortness of breath and weight gain. -Recently admitted 10/10-10/16 for sepsis due to necrotizing fasciitis of the scrotum/perineum, emergently taken to the OR for surgical debridement on 10/10.  He underwent I&D again on 10/12.  Wound culture from 10/10 with strep constellatus, MSSA, Prevotella bivia, beta-lactamase positive.  Patient was followed by ID and was treated with IV Unasyn in hospital and discharged on total 2-week course of Augmentin .  Also complicated by fluctuation in his kidney function, his diuretics, Entresto  and Farxiga  were discontinued at discharge.  He noticed progressive dyspnea on exertion and 18 pound weight gain over the last few weeks, so Dr. Marlee with [facility] on Thursday was restarted on diuretics  - His wife currently does wound care and no changes noted there  - In the ED hypertensive, labs with sodium 139, creatinine 3.7, BNP 728, chest x-ray with cardiomegaly pulmonary vascular congestion interstitial edema and pleural effusions   The patient is being diuresed, although it does not seem that he is putting out a lot of fluid. Cardiology is consulted. Creatinine is elevated due to diuresis. Nephrology is consulted as well.   Nephrology determined that the patient would require dialysis. Pt went to IR on the morning of 09/18/2024 for placement of a non-tunneled HD Catheter. He was then to go for HD later that day.  Subjective: -The patient is resting comfortably. He has some pain associated with catheter placement, but he states that it is not too bad. Continues to be quite edematous.  Assessment and Plan:  Acute on chronic HFpEF: - Admitted with worsening edema, dyspnea  and 18 pound weight gain  - Last echo 6/25 noted EF of 60-65%, grade 1 DD, normal RV  - Continue Lasix  80 mg twice daily, Coreg   - Urinary output remains poor.    AKI on CKD stage IV, ?progression to stage V Prior to recent admission his baseline creatinine was ~3.0.   - Last admission creatinine 3.5-3.8 range, discharged home off GDMT and diuretics  - Now with volume overload, Lasix  as noted above, holding Entresto  and finerenone   - Nephrology has been consulted. The patient will start dialysis later today - Non-tunneled catheter placed on the morning of 09/18/2024   Recent admission for NSTI of scrotum/perineum: Wound appears stable without any sign of superimposed infection.  He has completed his course of Augmentin .   -consult wound care for ongoing wound management.   Hypertension: Continue Coreg , amlodipine , hydralazine .  Holding Entresto .   Type 2 diabetes: Placed on SSI and Semglee  6 units daily.  Hemoglobin A1c 8.4% on 08/29/2024.   Hypothyroidism: Continue Synthroid .  Follow-up TSH   Hyperlipidemia: Continue simvastatin .   Anemia of chronic disease: Hemoglobin 8.8 on admission, slightly decreased from baseline ~10.  No obvious bleeding,, likely related to CKD and recent admission, blood loss - Check anemia panel   OSA: Continue CPAP nightly.  His spouse will bring his home machine in.   DVT prophylaxis: heparin  injection 5,000 Units Start: 09/15/24 2200 Code Status: Full code Family Communication: Spouse is at bedside Disposition Plan: Home  Consultants:  Cardiology Nephrology  Procedures:  Placement of non-tunneled HD catheter  Antimicrobials:  Anti-infectives (From admission, onward)    Start  Dose/Rate Route Frequency Ordered Stop   09/18/24 1215  ceFAZolin (ANCEF) IVPB 2g/100 mL premix        2 g 200 mL/hr over 30 Minutes Intravenous  Once 09/18/24 1116          Objective: Vitals:   09/18/24 2119 09/18/24 2300 09/19/24 0014 09/19/24 0510   BP: 130/65 121/66 121/66 126/74  Pulse: 73 67 78 80  Resp: 14 12 15 10   Temp: 97.6 F (36.4 C) 97.9 F (36.6 C) 97.9 F (36.6 C) 98.1 F (36.7 C)  TempSrc: Oral Oral Oral Oral  SpO2: 97% 100% 100% 100%  Weight:    135.1 kg  Height:        Intake/Output Summary (Last 24 hours) at 09/19/2024 0540 Last data filed at 09/18/2024 2000 Gross per 24 hour  Intake 240 ml  Output 3820 ml  Net -3580 ml   Filed Weights   09/18/24 1503 09/18/24 1836 09/19/24 0510  Weight: (!) 137.4 kg 135.2 kg 135.1 kg    Examination:  Exam:  Constitutional:  The patient is awake, alert, and oriented x 3. No acute distress. Respiratory:  No increased work of breathing. No wheezes, rales, or rhonchi No tactile fremitus Cardiovascular:  Regular rate and rhythm No murmurs, ectopy, or gallups. No lateral PMI. No thrills. Abdomen:  Abdomen is soft, non-tender, non-distended No hernias, masses, or organomegaly Normoactive bowel sounds.  Musculoskeletal:  No cyanosis, clubbing +1 pitting edema on extremities. Skin:  No rashes, lesions, ulcers palpation of skin: no induration or nodules Neurologic:  CN 2-12 intact Sensation all 4 extremities intact Psychiatric:  Mental status Mood, affect appropriate Orientation to person, place, time  judgment and insight appear intact  Data Reviewed:   CBC: Recent Labs  Lab 09/15/24 1846 09/15/24 1913 09/16/24 0250 09/17/24 0322 09/18/24 1541  WBC 10.3  --  10.0 8.7 8.9  HGB 8.8* 9.2* 8.2* 8.8* 8.5*  HCT 27.1* 27.0* 24.6* 25.7* 25.9*  MCV 85.8  --  84.2 82.4 85.2  PLT 280  --  262 284 287   Basic Metabolic Panel: Recent Labs  Lab 09/15/24 1846 09/15/24 1913 09/16/24 0250 09/17/24 0322 09/18/24 0319  NA 139 140 141 141 141  K 3.8 3.8 3.4* 3.6 3.6  CL 108 107 111 108 106  CO2 18*  --  18* 19* 20*  GLUCOSE 174* 167* 155* 283* 189*  BUN 41* 51* 40* 43* 44*  CREATININE 3.70* 3.90* 3.75* 3.98* 4.09*  CALCIUM 8.1*  --  8.2* 8.2* 8.5*   MG  --   --  1.8  --   --    GFR: Estimated Creatinine Clearance: 24.9 mL/min (A) (by C-G formula based on SCr of 4.09 mg/dL (H)). Liver Function Tests: Recent Labs  Lab 09/15/24 1846  AST 31  ALT 34  ALKPHOS 82  BILITOT 1.0  PROT 6.3*  ALBUMIN 2.6*   No results for input(s): LIPASE, AMYLASE in the last 168 hours. No results for input(s): AMMONIA in the last 168 hours. Coagulation Profile: No results for input(s): INR, PROTIME in the last 168 hours. Cardiac Enzymes: No results for input(s): CKTOTAL, CKMB, CKMBINDEX, TROPONINI in the last 168 hours. BNP (last 3 results) Recent Labs    08/28/24 1613  PROBNP 6,038.0*   HbA1C: No results for input(s): HGBA1C in the last 72 hours. CBG: Recent Labs  Lab 09/17/24 1729 09/17/24 2129 09/18/24 0645 09/18/24 1247 09/18/24 2134  GLUCAP 174* 175* 191* 185* 196*   Lipid Profile: No results for  input(s): CHOL, HDL, LDLCALC, TRIG, CHOLHDL, LDLDIRECT in the last 72 hours. Thyroid  Function Tests: No results for input(s): TSH, T4TOTAL, FREET4, T3FREE, THYROIDAB in the last 72 hours.  Anemia Panel: Recent Labs    09/17/24 0322  VITAMINB12 443  FOLATE 5.3*  FERRITIN 40  TIBC 190*  IRON 39*  RETICCTPCT 2.3   Urine analysis:    Component Value Date/Time   COLORURINE YELLOW 09/15/2024 2250   APPEARANCEUR HAZY (A) 09/15/2024 2250   LABSPEC 1.009 09/15/2024 2250   PHURINE 5.0 09/15/2024 2250   GLUCOSEU NEGATIVE 09/15/2024 2250   HGBUR NEGATIVE 09/15/2024 2250   BILIRUBINUR NEGATIVE 09/15/2024 2250   KETONESUR NEGATIVE 09/15/2024 2250   PROTEINUR 100 (A) 09/15/2024 2250   UROBILINOGEN 0.2 07/16/2007 0900   NITRITE NEGATIVE 09/15/2024 2250   LEUKOCYTESUR NEGATIVE 09/15/2024 2250   Sepsis Labs: @LABRCNTIP (procalcitonin:4,lacticidven:4)  )No results found for this or any previous visit (from the past 240 hours).   Radiology Studies: IR NON-TUNNELED CENTRAL VENOUS CATH [facility] W  IMG Result Date: 09/18/2024 CLINICAL DATA:  Worsening chronic kidney disease now requiring hemodialysis. EXAM: NON-TUNNELED CENTRAL VENOUS HEMODIALYSIS CATHETER PLACEMENT WITH ULTRASOUND AND FLUOROSCOPIC GUIDANCE FLUOROSCOPY: Radiation Exposure Index: 4.3 mGy Kerma PROCEDURE: The procedure, risks, benefits, and alternatives were explained to the patient. Questions regarding the procedure were encouraged and answered. The patient understands and consents to the procedure. A time out was performed prior to initiating the procedure. Ultrasound was used to confirm patency of the right internal jugular vein. An ultrasound image was saved and recorded. The right neck and chest were prepped with chlorhexidine  in a sterile fashion, and a sterile drape was applied covering the operative field. Maximum barrier sterile technique with sterile gowns and gloves were used for the procedure. Local anesthesia was provided with 1% lidocaine . After creating a small venotomy incision, a 21 gauge needle was advanced into the right internal jugular vein under direct, real-time ultrasound guidance. Ultrasound image documentation was performed. After securing guidewire access,the venotomy was dilated over a guide wire. A 16 cm, 13 Fr, triple lumen non-tunneled dialysis catheter was advanced over the wire. Final catheter positioning was confirmed and documented with a fluoroscopic spot image. The catheter was aspirated, flushed with saline, and injected with appropriate volume heparin  dwells. The catheter exit site was secured with 0-Prolene retention sutures. COMPLICATIONS: None.  No pneumothorax. FINDINGS: Initial plan was to place a tunneled hemodialysis catheter. However, when the patient arrived to the department, he was in significant respiratory distress with high oxygen requirement and required a non-rebreather mask in order to maintain adequate oxygenation. A tunneled catheter could not be placed as the patient could not be  safely sedated due to his clinical status. After the non tunneled catheter placement, the tip lies at the cavoatrial junction. The catheter aspirates normally and is ready for immediate use. IMPRESSION: Placement of non-tunneled central venous hemodialysis catheter via the right internal jugular vein. The catheter tip lies at the cavoatrial junction. The catheter is ready for immediate use. Electronically Signed   By: Marcey Moan M.D.   On: 09/18/2024 20:32     Scheduled Meds:  amLODipine   5 mg Oral Daily   carvedilol   12.5 mg Oral BID WC   Chlorhexidine  Gluconate Cloth  6 each Topical Q0600   dorzolamide  1 drop Both Eyes Daily   heparin   5,000 Units Subcutaneous Q8H   hydrALAZINE   50 mg Oral BID   insulin  aspart  0-5 Units Subcutaneous QHS   insulin  aspart  0-9  Units Subcutaneous TID WC   insulin  glargine-yfgn  6 Units Subcutaneous Daily   levothyroxine   125 mcg Oral Q0600   simvastatin   20 mg Oral QPM   sodium chloride  flush  3 mL Intravenous Q12H   timolol   1 drop Both Eyes Daily   Continuous Infusions:   ceFAZolin (ANCEF) IV     furosemide        LOS: 4 days   Time spent:  Maudine Kluesner, DO [facility]   09/19/2024, 5:50 PM

## 2024-09-19 NOTE — Progress Notes (Signed)
 PROGRESS NOTE    Brendan [name].  FMW:[id number] DOB: 1956-04-29 DOA: 09/15/2024 PCP: Rexanne Ingle, MD  Chief Complaint  Patient presents with   Shortness of Breath    Brief Narrative:   68/M with chronic diastolic CHF, CKD 4, T2DM, HTN, HLD, hypothyroidism, prostate cancer, anemia of chronic disease, OSA on CPAP who presented to the ED for evaluation of shortness of breath and weight gain. -Recently admitted 10/10-10/16 for sepsis due to necrotizing fasciitis of the scrotum/perineum, emergently taken to the OR for surgical debridement on 10/10.  He underwent I&D again on 10/12.  Wound culture from 10/10 with strep constellatus, MSSA, Prevotella bivia, beta-lactamase positive.  Patient was followed by ID and was treated with IV Unasyn in hospital and discharged on total 2-week course of Augmentin .  Also complicated by fluctuation in his kidney function, his diuretics, Entresto  and Farxiga  were discontinued at discharge.  He noticed progressive dyspnea on exertion and 18 pound weight gain over the last few weeks, so Dr. Marlee with [facility] on Thursday was restarted on diuretics  - His wife currently does wound care and no changes noted there  - In the ED hypertensive, labs with sodium 139, creatinine 3.7, BNP 728, chest x-ray with cardiomegaly pulmonary vascular congestion interstitial edema and pleural effusions    The patient is being diuresed, although it does not seem that he is putting out Derryl Uher lot of fluid. Cardiology is consulted. Creatinine is elevated due to diuresis. Nephrology is consulted as well.    Nephrology determined that the patient would require dialysis. Pt went to IR on the morning of 09/18/2024 for placement of Destynie Toomey non-tunneled HD Catheter. He was then to go for HD later that day.  Assessment & Plan:   Principal Problem:   Acute on chronic heart failure with preserved ejection fraction (HFpEF, >= 50%) (HCC) Active Problems:   CKD (chronic kidney  disease) stage 4, GFR 15-29 ml/min (HCC)   Insulin -requiring or dependent type II diabetes mellitus (HCC)   Hypertension associated with diabetes (HCC)   Hypothyroidism   Obstructive sleep apnea   Hyperlipidemia associated with type 2 diabetes mellitus (HCC)   Anemia of chronic disease  Volume Overload AKI on CKD IV  Concern for ESRD HFpEF Responded poorly to high dose diuresis Renal US  with increased echogenicity in both kidneys UA with 100 mg/dl protein, 0-5 RBC Diuresis per renal  Now s/p temp RIJ Hd catheter -> started on HD Appreciate renal recommendations, next HD Monday, plan for tunneled catheter on Tuesday Appreciate cardiology assistance - recommending coreg , hydral - adjust dose as needed with possible hypotension with HD  Hypertension Coreg , amlodipine , hydralazine .  Diuresis per renal (lasix ).  T2DM Basal (hyperglycemic this AM, increase to 10 units to start tmrw) + SSI  Adjust regimen as indicated  Hypothyroidism TSH elevated to 7.8 - will continue synthroid  125 micrograms daily Would repeat labs outside of acute illness, may need dose increase  Necrotizing Fasciitis of Perineum/Scrotum Completed course of abx Wound looks appropriate today Continue wound care  Surgery was to see as outpatient, may need to have them see while he's here - will check in with them on 11/3   Dyslipidemia Statin  Anemia  Noted, trend  OSA CPAP  Focal Mid L Renal Mass Needs renal CT or MRI as an outpatient    DVT prophylaxis: heparin  Code Status: full Family Communication: none Disposition:   Status is: Inpatient Remains inpatient appropriate because: need for continued inpatient care   Consultants:  Cardiology nephrology  Procedures: 10/31, non tunneled central venous HD catheter via right internal jugular vein  Antimicrobials:  Anti-infectives (From admission, onward)    Start     Dose/Rate Route Frequency Ordered Stop   09/18/24 1215  ceFAZolin (ANCEF)  IVPB 2g/100 mL premix        2 g 200 mL/hr over 30 Minutes Intravenous  Once 09/18/24 1116         Subjective: No new complaints today  Objective: Vitals:   09/19/24 1100 09/19/24 1136 09/19/24 1139 09/19/24 1531  BP: 127/74 114/69 118/68 128/72  Pulse: 68 67 67 73  Resp: 15 12 18  (!) 21  Temp:   97.7 F (36.5 C) (!) 97.4 F (36.3 C)  TempSrc:    Oral  SpO2: 100% 99% 97%   Weight:   132.3 kg   Height:        Intake/Output Summary (Last 24 hours) at 09/19/2024 1649 Last data filed at 09/19/2024 1139 Gross per 24 hour  Intake 240 ml  Output 6100 ml  Net -5860 ml   Filed Weights   09/19/24 0745 09/19/24 0824 09/19/24 1139  Weight: 135.1 kg 135.1 kg 132.3 kg    Examination:  General exam: Appears calm and comfortable  Respiratory system: unlabored Cardiovascular system: RRR GU - scrotal wound with beefy red tissue, no exudate or drainage noted Central nervous system: Alert and oriented. No focal neurological deficits. Extremities: bilateral LE edema, unna boots   Data Reviewed: I have personally reviewed following labs and imaging studies  CBC: Recent Labs  Lab 09/15/24 1846 09/15/24 1913 09/16/24 0250 09/17/24 0322 09/18/24 1541 09/19/24 0633  WBC 10.3  --  10.0 8.7 8.9 9.3  NEUTROABS  --   --   --   --   --  6.1  HGB 8.8* 9.2* 8.2* 8.8* 8.5* 8.2*  HCT 27.1* 27.0* 24.6* 25.7* 25.9* 24.5*  MCV 85.8  --  84.2 82.4 85.2 83.9  PLT 280  --  262 284 287 278    Basic Metabolic Panel: Recent Labs  Lab 09/15/24 1846 09/15/24 1913 09/16/24 0250 09/17/24 0322 09/18/24 0319 09/19/24 0633  NA 139 140 141 141 141 137  K 3.8 3.8 3.4* 3.6 3.6 3.8  CL 108 107 111 108 106 101  CO2 18*  --  18* 19* 20* 23  GLUCOSE 174* 167* 155* 283* 189* 334*  BUN 41* 51* 40* 43* 44* 33*  CREATININE 3.70* 3.90* 3.75* 3.98* 4.09* 3.59*  CALCIUM 8.1*  --  8.2* 8.2* 8.5* 7.8*  MG  --   --  1.8  --   --   --     GFR: Estimated Creatinine Clearance: 28.1 mL/min (Taliah Porche) (by C-G  formula based on SCr of 3.59 mg/dL (H)).  Liver Function Tests: Recent Labs  Lab 09/15/24 1846  AST 31  ALT 34  ALKPHOS 82  BILITOT 1.0  PROT 6.3*  ALBUMIN 2.6*    CBG: Recent Labs  Lab 09/18/24 1247 09/18/24 2134 09/19/24 0702 09/19/24 1206 09/19/24 1533  GLUCAP 185* 196* 319* 156* 285*     No results found for this or any previous visit (from the past 240 hours).       Radiology Studies: IR NON-TUNNELED CENTRAL VENOUS CATH [facility] W IMG Result Date: 09/18/2024 CLINICAL DATA:  Worsening chronic kidney disease now requiring hemodialysis. EXAM: NON-TUNNELED CENTRAL VENOUS HEMODIALYSIS CATHETER PLACEMENT WITH ULTRASOUND AND FLUOROSCOPIC GUIDANCE FLUOROSCOPY: Radiation Exposure Index: 4.3 mGy Kerma PROCEDURE: The procedure, risks, benefits, and alternatives  were explained to the patient. Questions regarding the procedure were encouraged and answered. The patient understands and consents to the procedure. Antwonette Feliz time out was performed prior to initiating the procedure. Ultrasound was used to confirm patency of the right internal jugular vein. An ultrasound image was saved and recorded. The right neck and chest were prepped with chlorhexidine  in Deklen Popelka sterile fashion, and Shantee Hayne sterile drape was applied covering the operative field. Maximum barrier sterile technique with sterile gowns and gloves were used for the procedure. Local anesthesia was provided with 1% lidocaine . After creating Faysal Fenoglio small venotomy incision, Tinslee Klare 21 gauge needle was advanced into the right internal jugular vein under direct, real-time ultrasound guidance. Ultrasound image documentation was performed. After securing guidewire access,the venotomy was dilated over Edina Winningham guide wire. Shaylan Tutton 16 cm, 13 Fr, triple lumen non-tunneled dialysis catheter was advanced over the wire. Final catheter positioning was confirmed and documented with Makayleigh Poliquin fluoroscopic spot image. The catheter was aspirated, flushed with saline, and injected with appropriate volume  heparin  dwells. The catheter exit site was secured with 0-Prolene retention sutures. COMPLICATIONS: None.  No pneumothorax. FINDINGS: Initial plan was to place Keontay Vora tunneled hemodialysis catheter. However, when the patient arrived to the department, he was in significant respiratory distress with high oxygen requirement and required Raymir Frommelt non-rebreather mask in order to maintain adequate oxygenation. Trayden Brandy tunneled catheter could not be placed as the patient could not be safely sedated due to his clinical status. After the non tunneled catheter placement, the tip lies at the cavoatrial junction. The catheter aspirates normally and is ready for immediate use. IMPRESSION: Placement of non-tunneled central venous hemodialysis catheter via the right internal jugular vein. The catheter tip lies at the cavoatrial junction. The catheter is ready for immediate use. Electronically Signed   By: Marcey Moan M.D.   On: 09/18/2024 20:32        Scheduled Meds:  amLODipine   5 mg Oral Daily   carvedilol   12.5 mg Oral BID WC   Chlorhexidine  Gluconate Cloth  6 each Topical Q0600   dorzolamide  1 drop Both Eyes Daily   heparin   5,000 Units Subcutaneous Q8H   hydrALAZINE   50 mg Oral BID   insulin  aspart  0-5 Units Subcutaneous QHS   insulin  aspart  0-9 Units Subcutaneous TID WC   insulin  glargine-yfgn  6 Units Subcutaneous Daily   levothyroxine   125 mcg Oral Q0600   simvastatin   20 mg Oral QPM   sodium chloride  flush  3 mL Intravenous Q12H   timolol   1 drop Both Eyes Daily   Continuous Infusions:   ceFAZolin (ANCEF) IV     furosemide  120 mg (09/19/24 0746)     LOS: 4 days    Time spent: over 30 min     Meliton Monte, MD [facility]   To contact the attending provider between 7A-7P or the covering provider during after hours 7P-7A, please log into the web site [email or web address] and access using universal Luck password for that web site. If you do not have the password, please call the hospital  operator.  09/19/2024, 4:49 PM

## 2024-09-19 NOTE — Progress Notes (Signed)
 2.5 liters ultrafiltration, condition stable post hemodialysis and report was given to the RN assuming care of this patient.SABRA

## 2024-09-19 NOTE — Plan of Care (Signed)

## 2024-09-20 DIAGNOSIS — I5033 Acute on chronic diastolic (congestive) heart failure: Secondary | ICD-10-CM | POA: Diagnosis not present

## 2024-09-20 LAB — BASIC METABOLIC PANEL WITH GFR
Anion gap: 12 (ref 5–15)
BUN: 30 mg/dL — ABNORMAL HIGH (ref 8–23)
CO2: 26 mmol/L (ref 22–32)
Calcium: 8.3 mg/dL — ABNORMAL LOW (ref 8.9–10.3)
Chloride: 98 mmol/L (ref 98–111)
Creatinine, Ser: 3.71 mg/dL — ABNORMAL HIGH (ref 0.61–1.24)
GFR, Estimated: 17 mL/min — ABNORMAL LOW (ref >60)
Glucose, Bld: 291 mg/dL — ABNORMAL HIGH (ref 70–99)
Potassium: 3.7 mmol/L (ref 3.5–5.1)
Sodium: 136 mmol/L (ref 135–145)

## 2024-09-20 LAB — GLUCOSE, CAPILLARY
Glucose-Capillary: 289 mg/dL — ABNORMAL HIGH (ref 70–99)
Glucose-Capillary: 291 mg/dL — ABNORMAL HIGH (ref 70–99)
Glucose-Capillary: 296 mg/dL — ABNORMAL HIGH (ref 70–99)
Glucose-Capillary: 272 mg/dL — ABNORMAL HIGH (ref 70–99)

## 2024-09-20 NOTE — Progress Notes (Signed)
 Brendan Chaikin. is an 68 y.o. male HFpEF, type 2 diabetes, hypertension, hyperlipidemia, hypothyroidism, prostate cancer, anemia of chronic disease, OSA on CPAP, CKD 4 followed by Dr. Marlee last seen in early October.  Patient recently admitted with necrotizing fasciitis of the scrotum/perineum status post surgical debridement on 10/10 as well as 10/12 with culture growing multipole organisms. treated with Unasyn as well as a outpatient 2-week course of Augmentin .  Farxiga  was discontinued at time of discharge as his creatinine was slightly elevated compared to baseline which is approximately 3.  Of note finerenone , Entresto  and torsemide  were held at time of discharge.  Unfortunately he has had a 18 pound weight gain since discharge with worsening shortness of breath past couple days before presentation especially with exertion, increased swelling in the lower extremities.  Torsemide  was restarted last Friday (5 days prior to presentation) by Dr. Marlee. In the ED BP was elevated Cr 3.7 BNP 729 and CXR showed pulmonary vasc congestion.   Assessment/Plan: Acute on CKD IV followed by Dr. Marlee. Concerned he may have progressed and is now CKDV with dialysis unfortunately possible in the near future. - Unfortunately very poor response to high dose diuresis; I d/w the pt at length the need to start HD. He is markedly overloaded;  PVR 0 on 10/30. - HD #1 on 10/31 with 3L UF; HD#2 on 11/1 2.5L  - Plan next HD on Mon, convert to TC on Tues and will CLIP on Mon for AKI (likely ESRD but can give him a mth); d/w Dr. Rolan and low likelihood of coming off dialysis.  [employer] boots to help mobilize the fluid -> placed AM 10/31. - Renal u/s RK 8.5cm LK 9.3cm with no e/o hydronephrosis   -Monitor Daily I/Os, Daily weight  -Maintain MAP>65 for optimal renal perfusion.  - Avoid nephrotoxic agents such as IV contrast, NSAIDs, and phosphate containing bowel preps (FLEETS)   HFpEF EF 60-65% - followed by Dr.  Cherrie, restarted on Torsemide  40mg  daily last Friday by Dr. Marlee with initially good response; pt states ankle edema initially got better. Hypertension - initially but now much better controlled borderline relative hypotension for him. T2DM on SSI and long acting insulin  being managed by the primary. Anemia -will check iron panels but with possible active infection would not like to use IV iron.  With active inflammation ESA's also will not be very effective. OSA on CPAP  Subjective: Poor response to high dose diuretics, sob much improved since starting dialysis.  PVR 0 on 10/30. Denies f/c/n/v.   Chemistry and CBC: Creatinine, Ser  Date/Time Value Ref Range Status  09/19/2024 06:33 AM 3.59 (H) 0.61 - 1.24 mg/dL Final  89/68/7974 96:80 AM 4.09 (H) 0.61 - 1.24 mg/dL Final  89/69/7974 96:77 AM 3.98 (H) 0.61 - 1.24 mg/dL Final  89/70/7974 97:49 AM 3.75 (H) 0.61 - 1.24 mg/dL Final  89/71/7974 92:86 PM 3.90 (H) 0.61 - 1.24 mg/dL Final  89/71/7974 93:53 PM 3.70 (H) 0.61 - 1.24 mg/dL Final  89/83/7974 95:40 AM 3.63 (H) 0.61 - 1.24 mg/dL Final  89/84/7974 98:51 AM 3.55 (H) 0.61 - 1.24 mg/dL Final  89/85/7974 96:57 AM 3.81 (H) 0.61 - 1.24 mg/dL Final  89/86/7974 95:73 AM 3.52 (H) 0.61 - 1.24 mg/dL Final  89/88/7974 96:73 AM 3.55 (H) 0.61 - 1.24 mg/dL Final  89/89/7974 95:86 PM 3.79 (H) 0.61 - 1.24 mg/dL Final  94/91/7974 89:72 AM 3.08 (H) 0.61 - 1.24 mg/dL Final  95/77/7974 87:86 PM 2.88 (H) 0.61 -  1.24 mg/dL Final  96/96/7974 89:57 AM 3.09 (H) 0.61 - 1.24 mg/dL Final  91/77/7975 88:56 AM 2.68 (H) 0.61 - 1.24 mg/dL Final  93/96/7975 88:81 AM 2.74 (H) 0.61 - 1.24 mg/dL Final  94/77/7975 97:45 PM 2.83 (H) 0.61 - 1.24 mg/dL Final  94/89/7975 96:61 PM 2.82 (H) 0.61 - 1.24 mg/dL Final  94/96/7975 90:86 AM 2.75 (H) 0.61 - 1.24 mg/dL Final  95/80/7975 87:49 PM 3.08 (H) 0.61 - 1.24 mg/dL Final  95/90/7975 97:46 PM 2.84 (H) 0.61 - 1.24 mg/dL Final  96/70/7975 88:62 AM 2.88 (H) 0.61 - 1.24  mg/dL Final  96/88/7975 91:41 AM 2.91 (H) 0.61 - 1.24 mg/dL Final  97/71/7975 98:46 PM 3.45 (H) 0.61 - 1.24 mg/dL Final  97/76/7975 97:59 PM 2.93 (H) 0.61 - 1.24 mg/dL Final  97/76/7975 93:87 AM 2.78 (H) 0.61 - 1.24 mg/dL Final  97/77/7975 95:51 PM 2.63 (H) 0.61 - 1.24 mg/dL Final  97/77/7975 92:94 AM 2.47 (H) 0.61 - 1.24 mg/dL Final  97/78/7975 96:72 PM 2.32 (H) 0.61 - 1.24 mg/dL Final  97/78/7975 93:49 AM 2.36 (H) 0.61 - 1.24 mg/dL Final  97/78/7975 98:67 AM 2.36 (H) 0.61 - 1.24 mg/dL Final  97/79/7975 95:99 PM 2.23 (H) 0.61 - 1.24 mg/dL Final  97/79/7975 98:70 AM 2.43 (H) 0.61 - 1.24 mg/dL Final  97/80/7975 92:81 PM 2.43 (H) 0.61 - 1.24 mg/dL Final  89/83/7976 91:50 AM 2.17 (H) 0.76 - 1.27 mg/dL Final  90/78/7976 96:75 PM 2.14 (H) 0.76 - 1.27 mg/dL Final  93/96/7976 90:64 AM 2.90 (H) 0.61 - 1.24 mg/dL Final  96/91/7976 88:92 AM 2.46 (H) 0.76 - 1.27 mg/dL Final  89/70/7978 90:60 AM 2.26 (H) 0.61 - 1.24 mg/dL Final  89/92/7978 88:55 AM 2.57 (H) 0.76 - 1.27 mg/dL Final  91/87/7978 89:79 AM 2.68 (H) 0.61 - 1.24 mg/dL Final  91/94/7978 90:82 AM 2.71 (H) 0.61 - 1.24 mg/dL Final  92/70/7978 87:77 PM 2.32 (H) 0.61 - 1.24 mg/dL Final  94/74/7978 89:46 AM 2.41 (H) 0.61 - 1.24 mg/dL Final  94/81/7978 95:94 PM 2.41 (H) 0.61 - 1.24 mg/dL Final  94/90/7978 94:57 AM 2.19 (H) 0.61 - 1.24 mg/dL Final  94/91/7978 90:97 PM 2.27 (H) 0.61 - 1.24 mg/dL Final  94/91/7978 95:54 PM 2.11 (H) 0.61 - 1.24 mg/dL Final  94/91/7978 93:62 AM 2.20 (H) 0.61 - 1.24 mg/dL Final  94/92/7978 93:94 PM 2.24 (H) 0.61 - 1.24 mg/dL Final  92/71/7981 91:91 PM 1.59 (H) 0.61 - 1.24 mg/dL Final   Recent Labs  Lab 09/15/24 1846 09/15/24 1913 09/16/24 0250 09/17/24 0322 09/18/24 0319 09/19/24 0633  NA 139 140 141 141 141 137  K 3.8 3.8 3.4* 3.6 3.6 3.8  CL 108 107 111 108 106 101  CO2 18*  --  18* 19* 20* 23  GLUCOSE 174* 167* 155* 283* 189* 334*  BUN 41* 51* 40* 43* 44* 33*  CREATININE 3.70* 3.90* 3.75* 3.98* 4.09*  3.59*  CALCIUM 8.1*  --  8.2* 8.2* 8.5* 7.8*   Recent Labs  Lab 09/16/24 0250 09/17/24 0322 09/18/24 1541 09/19/24 0633  WBC 10.0 8.7 8.9 9.3  NEUTROABS  --   --   --  6.1  HGB 8.2* 8.8* 8.5* 8.2*  HCT 24.6* 25.7* 25.9* 24.5*  MCV 84.2 82.4 85.2 83.9  PLT 262 284 287 278   Liver Function Tests: Recent Labs  Lab 09/15/24 1846  AST 31  ALT 34  ALKPHOS 82  BILITOT 1.0  PROT 6.3*  ALBUMIN 2.6*   No results for input(s):  LIPASE, AMYLASE in the last 168 hours. No results for input(s): AMMONIA in the last 168 hours. Cardiac Enzymes: No results for input(s): CKTOTAL, CKMB, CKMBINDEX, TROPONINI in the last 168 hours. Iron Studies:  No results for input(s): IRON, TIBC, TRANSFERRIN, FERRITIN in the last 72 hours.  PT/INR: @LABRCNTIP (inr:5)  Xrays/Other Studies: ) Results for orders placed or performed during the hospital encounter of 09/15/24 (from the past 48 hours)  Hepatitis B surface antibody,quantitative     Status: None   Collection Time: 09/18/24 12:29 PM  Result Value Ref Range   Hep B S AB Quant (Post) 1,777.0 Immunity>10 mIU/mL    Comment: (NOTE) Results confirmed on dilution.  Status of Immunity                     Anti-HBs Level  ------------------                     -------------- Inconsistent with Immunity                  0.0 - 10.0 Consistent with Immunity                         >10.0 Performed At: [facility] [address] [place], [place] [id number] Jennette Shorter MD Ey:[id number]   Hepatitis B surface antigen     Status: None   Collection Time: 09/18/24 12:29 PM  Result Value Ref Range   Hepatitis B Surface Ag NON REACTIVE NON REACTIVE    Comment: Performed at [facility], [address] [place], [place] [address]  Glucose, capillary     Status: Abnormal   Collection Time: 09/18/24 12:47 PM  Result Value Ref Range   Glucose-Capillary 185 (H) 70 - 99 mg/dL    Comment: Glucose reference range applies only  to samples taken after fasting for at least 8 hours.  CBC     Status: Abnormal   Collection Time: 09/18/24  3:41 PM  Result Value Ref Range   WBC 8.9 4.0 - 10.5 K/uL   RBC 3.04 (L) 4.22 - 5.81 MIL/uL   Hemoglobin 8.5 (L) 13.0 - 17.0 g/dL   HCT 74.0 (L) 60.9 - 47.9 %   MCV 85.2 80.0 - 100.0 fL   MCH 28.0 26.0 - 34.0 pg   MCHC 32.8 30.0 - 36.0 g/dL   RDW 83.8 (H) 88.4 - 84.4 %   Platelets 287 150 - 400 K/uL   nRBC 0.0 0.0 - 0.2 %    Comment: Performed at [facility], [address] [place], [place] [address]  Glucose, capillary     Status: Abnormal   Collection Time: 09/18/24  9:34 PM  Result Value Ref Range   Glucose-Capillary 196 (H) 70 - 99 mg/dL    Comment: Glucose reference range applies only to samples taken after fasting for at least 8 hours.   Comment 1 Notify RN    Comment 2 Document in Chart   Basic metabolic panel with GFR     Status: Abnormal   Collection Time: 09/19/24  6:33 AM  Result Value Ref Range   Sodium 137 135 - 145 mmol/L   Potassium 3.8 3.5 - 5.1 mmol/L   Chloride 101 98 - 111 mmol/L   CO2 23 22 - 32 mmol/L   Glucose, Bld 334 (H) 70 - 99 mg/dL    Comment: Glucose reference range applies only to samples taken after fasting for  at least 8 hours.   BUN 33 (H) 8 - 23 mg/dL   Creatinine, Ser 6.40 (H) 0.61 - 1.24 mg/dL   Calcium 7.8 (L) 8.9 - 10.3 mg/dL   GFR, Estimated 18 (L) >60 mL/min    Comment: (NOTE) Calculated using the CKD-EPI Creatinine Equation (2021)    Anion gap 13 5 - 15    Comment: Performed at [facility], [address], [place], [place] [address]  CBC with Differential/Platelet     Status: Abnormal   Collection Time: 09/19/24  6:33 AM  Result Value Ref Range   WBC 9.3 4.0 - 10.5 K/uL   RBC 2.92 (L) 4.22 - 5.81 MIL/uL   Hemoglobin 8.2 (L) 13.0 - 17.0 g/dL   HCT 75.4 (L) 60.9 - 47.9 %   MCV 83.9 80.0 - 100.0 fL   MCH 28.1 26.0 - 34.0 pg   MCHC 33.5 30.0 - 36.0 g/dL   RDW 83.9 (H) 88.4 - 84.4 %   Platelets 278 150 - 400  K/uL   nRBC 0.0 0.0 - 0.2 %   Neutrophils Relative % 66 %   Neutro Abs 6.1 1.7 - 7.7 K/uL   Lymphocytes Relative 11 %   Lymphs Abs 1.1 0.7 - 4.0 K/uL   Monocytes Relative 9 %   Monocytes Absolute 0.8 0.1 - 1.0 K/uL   Eosinophils Relative 13 %   Eosinophils Absolute 1.2 (H) 0.0 - 0.5 K/uL   Basophils Relative 1 %   Basophils Absolute 0.1 0.0 - 0.1 K/uL   Immature Granulocytes 0 %   Abs Immature Granulocytes 0.04 0.00 - 0.07 K/uL    Comment: Performed at [facility], [address], [place], [place] [address]  Glucose, capillary     Status: Abnormal   Collection Time: 09/19/24  7:02 AM  Result Value Ref Range   Glucose-Capillary 319 (H) 70 - 99 mg/dL    Comment: Glucose reference range applies only to samples taken after fasting for at least 8 hours.   Comment 1 Notify RN    Comment 2 Document in Chart   Glucose, capillary     Status: Abnormal   Collection Time: 09/19/24 12:06 PM  Result Value Ref Range   Glucose-Capillary 156 (H) 70 - 99 mg/dL    Comment: Glucose reference range applies only to samples taken after fasting for at least 8 hours.  Glucose, capillary     Status: Abnormal   Collection Time: 09/19/24  3:33 PM  Result Value Ref Range   Glucose-Capillary 285 (H) 70 - 99 mg/dL    Comment: Glucose reference range applies only to samples taken after fasting for at least 8 hours.  Glucose, capillary     Status: Abnormal   Collection Time: 09/19/24  9:24 PM  Result Value Ref Range   Glucose-Capillary 279 (H) 70 - 99 mg/dL    Comment: Glucose reference range applies only to samples taken after fasting for at least 8 hours.   Comment 1 Notify RN    Comment 2 Document in Chart   Glucose, capillary     Status: Abnormal   Collection Time: 09/20/24  6:37 AM  Result Value Ref Range   Glucose-Capillary 289 (H) 70 - 99 mg/dL    Comment: Glucose reference range applies only to samples taken after fasting for at least 8 hours.   Comment 1 Notify RN    Comment 2 Document in  Chart    IR NON-TUNNELED CENTRAL VENOUS CATH PLC W  IMG Result Date: 09/18/2024 CLINICAL DATA:  Worsening chronic kidney disease now requiring hemodialysis. EXAM: NON-TUNNELED CENTRAL VENOUS HEMODIALYSIS CATHETER PLACEMENT WITH ULTRASOUND AND FLUOROSCOPIC GUIDANCE FLUOROSCOPY: Radiation Exposure Index: 4.3 mGy Kerma PROCEDURE: The procedure, risks, benefits, and alternatives were explained to the patient. Questions regarding the procedure were encouraged and answered. The patient understands and consents to the procedure. A time out was performed prior to initiating the procedure. Ultrasound was used to confirm patency of the right internal jugular vein. An ultrasound image was saved and recorded. The right neck and chest were prepped with chlorhexidine  in a sterile fashion, and a sterile drape was applied covering the operative field. Maximum barrier sterile technique with sterile gowns and gloves were used for the procedure. Local anesthesia was provided with 1% lidocaine . After creating a small venotomy incision, a 21 gauge needle was advanced into the right internal jugular vein under direct, real-time ultrasound guidance. Ultrasound image documentation was performed. After securing guidewire access,the venotomy was dilated over a guide wire. A 16 cm, 13 Fr, triple lumen non-tunneled dialysis catheter was advanced over the wire. Final catheter positioning was confirmed and documented with a fluoroscopic spot image. The catheter was aspirated, flushed with saline, and injected with appropriate volume heparin  dwells. The catheter exit site was secured with 0-Prolene retention sutures. COMPLICATIONS: None.  No pneumothorax. FINDINGS: Initial plan was to place a tunneled hemodialysis catheter. However, when the patient arrived to the department, he was in significant respiratory distress with high oxygen requirement and required a non-rebreather mask in order to maintain adequate oxygenation. A tunneled catheter  could not be placed as the patient could not be safely sedated due to his clinical status. After the non tunneled catheter placement, the tip lies at the cavoatrial junction. The catheter aspirates normally and is ready for immediate use. IMPRESSION: Placement of non-tunneled central venous hemodialysis catheter via the right internal jugular vein. The catheter tip lies at the cavoatrial junction. The catheter is ready for immediate use. Electronically Signed   By: Marcey Moan M.D.   On: 09/18/2024 20:32     PMH:   Past Medical History:  Diagnosis Date   Acanthosis nigricans    Atopic dermatitis    CHF (congestive heart failure) (HCC)    CKD (chronic kidney disease) stage 4, GFR 15-29 ml/min (HCC) 01/08/2023   Diabetes (HCC) 11/19/2002   Erectile dysfunction    Fatty liver 11/19/2005   Glaucoma 11/19/2006   Gynecomastia 11/20/2007   Hx of adenomatous colonic polyps 08/26/2023   Hyperaldosteronism 11/19/1998   Hypercholesterolemia    Hyperlipidemia 2010   Hypertension 1999   Hypoglycemic reaction    Hypothyroidism 11/19/2002   Incontinence    Intermittent vertigo 11/19/2010   Left cervical radiculopathy 11/19/2010   Lumbar radiculopathy    Obesity    Peripheral neuropathy    Pollen allergies 11/19/2005   perennial   Prostate cancer (HCC) 11/19/2004   Reflux esophagitis 11/19/1993   Sickle cell trait 11/19/2004   Sleep apnea, obstructive 11/19/1998   uses a cpap   Venous insufficiency 11/20/2003   Vitamin D deficiency 11/19/2010    PSH:   Past Surgical History:  Procedure Laterality Date   COLONOSCOPY  2006   normal   INCISION AND DRAINAGE OF WOUND N/A 08/28/2024   Procedure: IRRIGATION AND EXCISIONAL DEBRIDEMENT WOUND;  Surgeon: Lyndel Deward PARAS, MD;  Location: [facility] OR;  Service: General;  Laterality: N/A;  EXCISIONAL DEBRIDEMENT   INCISION AND DRAINAGE PERIRECTAL ABSCESS N/A 08/30/2024   Procedure:  INCISION AND DRAINAGE OF PERINEAL WOUND;  Surgeon: Polly Cordella LABOR, MD;  Location: [facility] OR;  Service: General;  Laterality: N/A;   IR TUNNELED CENTRAL VENOUS CATH PLC W IMG  09/18/2024   lap band surgery  2009   left inguinal hernia repair  1998   LIPOMA EXCISION Left 04/17/2023   Procedure: MINOR EXCISION LEFT BUTTOCK SEBACEOUS CYST;  Surgeon: Lyndel Deward PARAS, MD;  Location: [facility];  Service: General;  Laterality: Left;   robotic prostatectomy  2008    Allergies:  Allergies  Allergen Reactions   Ace Inhibitors Cough   Maxidex [Dexamethasone] Other (See Comments)    Sexual dysfunction   Verapamil Other (See Comments)    constipation    Medications:   Prior to Admission medications   Medication Sig Start Date End Date Taking? Authorizing Provider  acetaminophen  (TYLENOL ) 500 MG tablet Take 500 mg by mouth every 6 (six) hours as needed for mild pain.   Yes [provider]  amLODipine  (NORVASC ) 5 MG tablet Take 1 tablet (5 mg total) by mouth daily. 09/04/24 10/04/24 Yes Perri LABOR Meliton Mickey., MD  carvedilol  (COREG ) 25 MG tablet Take 1 tablet (25 mg total) by mouth 2 (two) times daily with food 04/01/24  Yes   Continuous Glucose Sensor (FREESTYLE LIBRE 3 PLUS SENSOR) MISC Apply to upper back of arm. Change every 15 days. 04/03/24  Yes   Continuous Glucose Sensor (FREESTYLE LIBRE 3 SENSOR) MISC Apply to upper back of arm and change ever 14 days. 01/28/24  Yes Faythe Purchase, MD  dorzolamide (TRUSOPT) 2 % ophthalmic solution Place 1 drop into both eyes daily.   Yes [provider]  hydrALAZINE  (APRESOLINE ) 100 MG tablet Take 1/2 tablet (50 mg total) by mouth 2 (two) times daily. 08/30/23  Yes Clegg, Amy D, NP  insulin  glargine (LANTUS  SOLOSTAR) 100 UNIT/ML Solostar Pen Inject 15 Units into the skin daily. Follow up with your PCP outpatient for further adjustment to your regimen 09/03/24  Yes Perri LABOR Meliton Mickey., MD  insulin  lispro (HUMALOG  KWIKPEN) 200 UNIT/ML KwikPen Inject 0-15 Units into the skin 3 (three)  times daily with meals. CBG < 70: treat low blood sugar CBG 70 - 120: 0 units  CBG 121 - 150: 2 units  CBG 151 - 200: 3 units  CBG 201 - 250: 5 units  CBG 251 - 300: 8 units  CBG 301 - 350: 11 units  CBG 351 - 400: 15 units  CBG >400: Call MD 09/03/24  Yes Perri LABOR Meliton Mickey., MD  levothyroxine  (SYNTHROID ) 125 MCG tablet Take 1 tablet (125 mcg total) by mouth every morning on an empty stomach 06/08/24  Yes   simvastatin  (ZOCOR ) 20 MG tablet Take 1 tablet (20 mg total) by mouth every evening. 07/09/24  Yes   timolol  (BETIMOL ) 0.5 % ophthalmic solution Place 1 drop into both eyes daily.   Yes [provider]  tirzepatide  (MOUNJARO ) 15 MG/0.5ML Pen Inject 15 mg into the skin once a week. 04/14/24  Yes   torsemide  (DEMADEX ) 20 MG tablet Take 3 tablets (60 mg total) by mouth daily. Patient taking differently: Take 40 mg by mouth daily. 08/08/23 09/15/25 Yes Milford, Harlene HERO, FNP  clobetasol  ointment (TEMOVATE ) 0.05 % Apply topically daily before sleeping. Patient not taking: Reported on 08/29/2024 05/19/24     Finerenone  (KERENDIA ) 20 MG TABS Take 20 mg by mouth daily. Patient not taking: Reported on 09/15/2024    [provider]  Insulin   Pen Needle (TECHLITE PEN NEEDLES) 32G X 4 MM MISC Use to inject insulin  4 times daily 10/25/22   Faythe Purchase, MD  Insulin  Pen Needle 32G X 4 MM MISC Use to inject insulin  up to 4 times daily. 12/26/23   Faythe Purchase, MD  potassium chloride  SA (KLOR-CON  M) 20 MEQ tablet Take 3 tablets (60 mEq total) by mouth daily. Patient not taking: Reported on 09/15/2024 03/10/24   Glena Harlene HERO, FNP  sacubitril -valsartan  (ENTRESTO ) 49-51 MG Take 1 tablet by mouth 2 (two) times daily. Patient not taking: Reported on 09/15/2024 08/25/24   Bensimhon, Toribio SAUNDERS, MD    Discontinued Meds:   Medications Discontinued During This Encounter  Medication Reason   albuterol  (VENTOLIN  HFA) 108 (90 Base) MCG/ACT inhaler 2 puff    insulin  glargine-yfgn (SEMGLEE )  injection 6 Units    timolol  (BETIMOL ) 0.5 % ophthalmic solution 1 drop    potassium chloride  (KLOR-CON  M) CR tablet 10 mEq    carvedilol  (COREG ) tablet 25 mg    dorzolamide (TRUSOPT) 2 % ophthalmic solution 1 drop    furosemide  (LASIX ) injection 80 mg    furosemide  (LASIX ) 120 mg in dextrose  5 % 50 mL IVPB    pentafluoroprop-tetrafluoroeth (GEBAUERS) aerosol 1 Application Patient Transfer   lidocaine  (PF) (XYLOCAINE ) 1 % injection 5 mL Patient Transfer   lidocaine -prilocaine (EMLA) cream 1 Application Patient Transfer   heparin  injection 1,000 Units Patient Transfer   anticoagulant sodium citrate solution 5 mL Patient Transfer   alteplase (CATHFLO ACTIVASE) injection 2 mg Patient Transfer   furosemide  (LASIX ) 120 mg in dextrose  5 % 50 mL IVPB    insulin  glargine-yfgn (SEMGLEE ) injection 6 Units     Social History:  reports that he has never smoked. He has never used smokeless tobacco. He reports current alcohol use. He reports that he does not use drugs.  Family History:   Family History  Problem Relation Age of Onset   Heart failure Mother    Hypertension Father        deceased age 29   Heart attack Father    COPD Sister    CVA Sister    Diabetes Daughter    Colon cancer Neg Hx    Stomach cancer Neg Hx    Colon polyps Neg Hx    Esophageal cancer Neg Hx    Rectal cancer Neg Hx     Blood pressure (!) 143/75, pulse 71, temperature 98 F (36.7 C), temperature source Oral, resp. rate 16, height 6' 2 (1.88 m), weight 134.5 kg, SpO2 97%. Physical Exam: GEN: NAD, A&Ox3, NCAT, obese HEENT: No conjunctival pallor, EOMI NECK: Supple, no thyromegaly LUNGS: CTA B/L no rales, rhonchi or wheezing CV: RRR, No M/R/G ABD: SNDNT +BS  EXT: 1+ lower extremity edema to thighs GU: male purewick ACCESS: RIJ temp    Brendan LYNWOOD ORN, MD 09/20/2024, 9:34 AM

## 2024-09-20 NOTE — Progress Notes (Signed)
 PROGRESS NOTE    Brendan [name].  FMW:[id number] DOB: 03/05/1956 DOA: 09/15/2024 PCP: Rexanne Ingle, MD  Chief Complaint  Patient presents with   Shortness of Breath    Brief Narrative:   68/M with chronic diastolic CHF, CKD 4, T2DM, HTN, HLD, hypothyroidism, prostate cancer, anemia of chronic disease, OSA on CPAP who presented to the ED for evaluation of shortness of breath and weight gain. -Recently admitted 10/10-10/16 for sepsis due to necrotizing fasciitis of the scrotum/perineum, emergently taken to the OR for surgical debridement on 10/10.  He underwent I&D again on 10/12.  Wound culture from 10/10 with strep constellatus, MSSA, Prevotella bivia, beta-lactamase positive.  Patient was followed by ID and was treated with IV Unasyn in hospital and discharged on total 2-week course of Augmentin .  Also complicated by fluctuation in his kidney function, his diuretics, Entresto  and Farxiga  were discontinued at discharge.  He noticed progressive dyspnea on exertion and 18 pound weight gain over the last few weeks, so Dr. Marlee with [facility] on Thursday was restarted on diuretics  - His wife currently does wound care and no changes noted there  - In the ED hypertensive, labs with sodium 139, creatinine 3.7, BNP 728, chest x-ray with cardiomegaly pulmonary vascular congestion interstitial edema and pleural effusions    The patient is being diuresed, although it does not seem that he is putting out Louvina Cleary lot of fluid. Cardiology is consulted. Creatinine is elevated due to diuresis. Nephrology is consulted as well.    Nephrology determined that the patient would require dialysis. Pt went to IR on the morning of 09/18/2024 for placement of Zarianna Dicarlo non-tunneled HD Catheter. He was then to go for HD later that day.  Assessment & Plan:   Principal Problem:   Acute on chronic heart failure with preserved ejection fraction (HFpEF, >= 50%) (HCC) Active Problems:   CKD (chronic kidney  disease) stage 4, GFR 15-29 ml/min (HCC)   Insulin -requiring or dependent type II diabetes mellitus (HCC)   Hypertension associated with diabetes (HCC)   Hypothyroidism   Obstructive sleep apnea   Hyperlipidemia associated with type 2 diabetes mellitus (HCC)   Anemia of chronic disease  Volume Overload AKI on CKD IV  Concern for ESRD HFpEF Responded poorly to high dose diuresis Renal US  with increased echogenicity in both kidneys UA with 100 mg/dl protein, 0-5 RBC Diuresis per renal  Now s/p temp RIJ Hd catheter -> started on HD Appreciate renal recommendations, next HD Monday, plan for tunneled catheter on Tuesday Appreciate cardiology assistance - recommending coreg , hydral - adjust dose as needed with possible hypotension with HD  Hypertension Coreg , amlodipine , hydralazine .  Diuresis per renal (lasix ).  T2DM Basal (hyperglycemic this AM, increase to 10 units) + SSI  Adjust regimen as indicated  Hypothyroidism TSH elevated to 7.8 - will continue synthroid  125 micrograms daily Would repeat labs outside of acute illness, may need dose increase  Necrotizing Fasciitis of Perineum/Scrotum Completed course of abx Wound looks appropriate today Continue wound care  Surgery was to see as outpatient, may need to have them see while he's here - will check in with them on 11/3   Dyslipidemia Statin  Anemia  Noted, trend  OSA CPAP  Focal Mid L Renal Mass Needs renal CT or MRI as an outpatient     DVT prophylaxis: heparin  Code Status: full Family Communication: none Disposition:   Status is: Inpatient Remains inpatient appropriate because: need for continued inpatient care   Consultants:  Cardiology  nephrology  Procedures: 10/31, non tunneled central venous HD catheter via right internal jugular vein  Antimicrobials:  Anti-infectives (From admission, onward)    Start     Dose/Rate Route Frequency Ordered Stop   09/18/24 1215  ceFAZolin (ANCEF) IVPB 2g/100 mL  premix        2 g 200 mL/hr over 30 Minutes Intravenous  Once 09/18/24 1116         Subjective: No new complaints Hopes dialysis will be temporary  Objective: Vitals:   09/20/24 0644 09/20/24 0742 09/20/24 0831 09/20/24 0833  BP:  98/85 (!) 143/75 (!) 143/75  Pulse:  (!) 53 71   Resp:  15 16   Temp:  98 F (36.7 C)    TempSrc:  Oral    SpO2:  100% 97%   Weight: 134.5 kg     Height:        Intake/Output Summary (Last 24 hours) at 09/20/2024 0840 Last data filed at 09/19/2024 2000 Gross per 24 hour  Intake 302 ml  Output 2500 ml  Net -2198 ml   Filed Weights   09/19/24 0824 09/19/24 1139 09/20/24 0644  Weight: 135.1 kg 132.3 kg 134.5 kg    Examination:  General: No acute distress. Cardiovascular: RRR Lungs: unlabored Abdomen: Soft, nontender, nondistended. GU wound not examined today Neurological: Alert and oriented 3. Moves all extremities 4 with equal strength. Cranial nerves II through XII grossly intact. Extremities: bilateral LE edema, unna boots   Data Reviewed: I have personally reviewed following labs and imaging studies  CBC: Recent Labs  Lab 09/15/24 1846 09/15/24 1913 09/16/24 0250 09/17/24 0322 09/18/24 1541 09/19/24 0633  WBC 10.3  --  10.0 8.7 8.9 9.3  NEUTROABS  --   --   --   --   --  6.1  HGB 8.8* 9.2* 8.2* 8.8* 8.5* 8.2*  HCT 27.1* 27.0* 24.6* 25.7* 25.9* 24.5*  MCV 85.8  --  84.2 82.4 85.2 83.9  PLT 280  --  262 284 287 278    Basic Metabolic Panel: Recent Labs  Lab 09/15/24 1846 09/15/24 1913 09/16/24 0250 09/17/24 0322 09/18/24 0319 09/19/24 0633  NA 139 140 141 141 141 137  K 3.8 3.8 3.4* 3.6 3.6 3.8  CL 108 107 111 108 106 101  CO2 18*  --  18* 19* 20* 23  GLUCOSE 174* 167* 155* 283* 189* 334*  BUN 41* 51* 40* 43* 44* 33*  CREATININE 3.70* 3.90* 3.75* 3.98* 4.09* 3.59*  CALCIUM 8.1*  --  8.2* 8.2* 8.5* 7.8*  MG  --   --  1.8  --   --   --     GFR: Estimated Creatinine Clearance: 28.3 mL/min (TRUE Garciamartinez) (by C-G  formula based on SCr of 3.59 mg/dL (H)).  Liver Function Tests: Recent Labs  Lab 09/15/24 1846  AST 31  ALT 34  ALKPHOS 82  BILITOT 1.0  PROT 6.3*  ALBUMIN 2.6*    CBG: Recent Labs  Lab 09/19/24 0702 09/19/24 1206 09/19/24 1533 09/19/24 2124 09/20/24 0637  GLUCAP 319* 156* 285* 279* 289*     No results found for this or any previous visit (from the past 240 hours).       Radiology Studies: IR NON-TUNNELED CENTRAL VENOUS CATH [facility] W IMG Result Date: 09/18/2024 CLINICAL DATA:  Worsening chronic kidney disease now requiring hemodialysis. EXAM: NON-TUNNELED CENTRAL VENOUS HEMODIALYSIS CATHETER PLACEMENT WITH ULTRASOUND AND FLUOROSCOPIC GUIDANCE FLUOROSCOPY: Radiation Exposure Index: 4.3 mGy Kerma PROCEDURE: The procedure, risks, benefits, and  alternatives were explained to the patient. Questions regarding the procedure were encouraged and answered. The patient understands and consents to the procedure. Zaryah Seckel time out was performed prior to initiating the procedure. Ultrasound was used to confirm patency of the right internal jugular vein. An ultrasound image was saved and recorded. The right neck and chest were prepped with chlorhexidine  in Aysia Lowder sterile fashion, and Lakeyn Dokken sterile drape was applied covering the operative field. Maximum barrier sterile technique with sterile gowns and gloves were used for the procedure. Local anesthesia was provided with 1% lidocaine . After creating Aniello Christopoulos small venotomy incision, Diya Gervasi 21 gauge needle was advanced into the right internal jugular vein under direct, real-time ultrasound guidance. Ultrasound image documentation was performed. After securing guidewire access,the venotomy was dilated over Farron Lafond guide wire. Jonasia Coiner 16 cm, 13 Fr, triple lumen non-tunneled dialysis catheter was advanced over the wire. Final catheter positioning was confirmed and documented with Angelisa Winthrop fluoroscopic spot image. The catheter was aspirated, flushed with saline, and injected with appropriate volume  heparin  dwells. The catheter exit site was secured with 0-Prolene retention sutures. COMPLICATIONS: None.  No pneumothorax. FINDINGS: Initial plan was to place Giancarlo Askren tunneled hemodialysis catheter. However, when the patient arrived to the department, he was in significant respiratory distress with high oxygen requirement and required Tashaun Obey non-rebreather mask in order to maintain adequate oxygenation. Sher Shampine tunneled catheter could not be placed as the patient could not be safely sedated due to his clinical status. After the non tunneled catheter placement, the tip lies at the cavoatrial junction. The catheter aspirates normally and is ready for immediate use. IMPRESSION: Placement of non-tunneled central venous hemodialysis catheter via the right internal jugular vein. The catheter tip lies at the cavoatrial junction. The catheter is ready for immediate use. Electronically Signed   By: Marcey Moan M.D.   On: 09/18/2024 20:32        Scheduled Meds:  amLODipine   5 mg Oral Daily   carvedilol   12.5 mg Oral BID WC   Chlorhexidine  Gluconate Cloth  6 each Topical Q0600   dorzolamide  1 drop Both Eyes Daily   heparin   5,000 Units Subcutaneous Q8H   hydrALAZINE   50 mg Oral BID   insulin  aspart  0-5 Units Subcutaneous QHS   insulin  aspart  0-9 Units Subcutaneous TID WC   insulin  glargine-yfgn  10 Units Subcutaneous Daily   levothyroxine   125 mcg Oral Q0600   simvastatin   20 mg Oral QPM   sodium chloride  flush  3 mL Intravenous Q12H   timolol   1 drop Both Eyes Daily   Continuous Infusions:   ceFAZolin (ANCEF) IV     furosemide  120 mg (09/20/24 0837)     LOS: 5 days    Time spent: over 30 min     Meliton Monte, MD [facility]   To contact the attending provider between 7A-7P or the covering provider during after hours 7P-7A, please log into the web site [email or web address] and access using universal [place] password for that web site. If you do not have the password, please call the hospital  operator.  09/20/2024, 8:40 AM

## 2024-09-21 DIAGNOSIS — I5033 Acute on chronic diastolic (congestive) heart failure: Secondary | ICD-10-CM | POA: Diagnosis not present

## 2024-09-21 LAB — CBC
HCT: 25.7 % — ABNORMAL LOW (ref 39.0–52.0)
Hemoglobin: 8.6 g/dL — ABNORMAL LOW (ref 13.0–17.0)
MCH: 27.9 pg (ref 26.0–34.0)
MCHC: 33.5 g/dL (ref 30.0–36.0)
MCV: 83.4 fL (ref 80.0–100.0)
Platelets: 258 K/uL (ref 150–400)
RBC: 3.08 MIL/uL — ABNORMAL LOW (ref 4.22–5.81)
RDW: 15.9 % — ABNORMAL HIGH (ref 11.5–15.5)
WBC: 11.1 K/uL — ABNORMAL HIGH (ref 4.0–10.5)
nRBC: 0 % (ref 0.0–0.2)

## 2024-09-21 LAB — GLUCOSE, CAPILLARY
Glucose-Capillary: 249 mg/dL — ABNORMAL HIGH (ref 70–99)
Glucose-Capillary: 356 mg/dL — ABNORMAL HIGH (ref 70–99)
Glucose-Capillary: 474 mg/dL — ABNORMAL HIGH (ref 70–99)
Glucose-Capillary: 349 mg/dL — ABNORMAL HIGH (ref 70–99)

## 2024-09-21 LAB — BASIC METABOLIC PANEL WITH GFR
Anion gap: 14 (ref 5–15)
BUN: 34 mg/dL — ABNORMAL HIGH (ref 8–23)
CO2: 23 mmol/L (ref 22–32)
Calcium: 8.3 mg/dL — ABNORMAL LOW (ref 8.9–10.3)
Chloride: 99 mmol/L (ref 98–111)
Creatinine, Ser: 4.15 mg/dL — ABNORMAL HIGH (ref 0.61–1.24)
GFR, Estimated: 15 mL/min — ABNORMAL LOW (ref >60)
Glucose, Bld: 235 mg/dL — ABNORMAL HIGH (ref 70–99)
Potassium: 3.4 mmol/L — ABNORMAL LOW (ref 3.5–5.1)
Sodium: 136 mmol/L (ref 135–145)

## 2024-09-21 MED ORDER — SODIUM CHLORIDE 0.9 % IV SOLN
200.0000 mg | INTRAVENOUS | Status: DC
  Filled 2024-09-21: qty 10

## 2024-09-21 MED ORDER — HEPARIN SODIUM (PORCINE) 1000 UNIT/ML IJ SOLN
2600.0000 [IU] | Freq: Once | INTRAMUSCULAR | Status: AC
  Administered 2024-09-21: 2600 [IU]

## 2024-09-21 MED ORDER — INSULIN GLARGINE-YFGN 100 UNIT/ML ~~LOC~~ SOLN
5.0000 [IU] | Freq: Once | SUBCUTANEOUS | Status: AC
  Administered 2024-09-21: 5 [IU] via SUBCUTANEOUS
  Filled 2024-09-21: qty 0.05

## 2024-09-21 MED ORDER — HEPARIN SODIUM (PORCINE) 1000 UNIT/ML IJ SOLN
INTRAMUSCULAR | Status: AC
  Filled 2024-09-21: qty 3

## 2024-09-21 MED ORDER — SODIUM CHLORIDE 0.9 % IV SOLN: 200.0000 mg | INTRAVENOUS | Status: DC

## 2024-09-21 MED ORDER — IRON SUCROSE 200 MG IVPB - SIMPLE MED
200.0000 mg | Status: DC
  Administered 2024-09-21: 200 mg via INTRAVENOUS
  Filled 2024-09-21: qty 200
  Filled 2024-09-21: qty 110

## 2024-09-21 MED ORDER — INSULIN ASPART 100 UNIT/ML IJ SOLN
10.0000 [IU] | Freq: Once | INTRAMUSCULAR | Status: AC
  Administered 2024-09-21: 10 [IU] via SUBCUTANEOUS
  Filled 2024-09-21: qty 10

## 2024-09-21 MED ORDER — INSULIN GLARGINE-YFGN 100 UNIT/ML ~~LOC~~ SOLN
15.0000 [IU] | Freq: Every day | SUBCUTANEOUS | Status: DC
  Filled 2024-09-21: qty 0.15

## 2024-09-21 NOTE — Progress Notes (Signed)
 PROGRESS NOTE    [name]  FMW:[id number] DOB: 10-02-1956 DOA: 09/15/2024 PCP: Rexanne Ingle, MD  Chief Complaint  Patient presents with   Shortness of Breath    Brief Narrative:   68/M with chronic diastolic CHF, CKD 4, T2DM, HTN, HLD, hypothyroidism, prostate cancer, anemia of chronic disease, OSA on CPAP who presented to the ED for evaluation of shortness of breath and weight gain. -Recently admitted 10/10-10/16 for sepsis due to necrotizing fasciitis of the scrotum/perineum, emergently taken to the OR for surgical debridement on 10/10.  He underwent I&D again on 10/12.  Wound culture from 10/10 with strep constellatus, MSSA, Prevotella bivia, beta-lactamase positive.  Patient was followed by ID and was treated with IV Unasyn in hospital and discharged on total 2-week course of Augmentin .  Also complicated by fluctuation in his kidney function, his diuretics, Entresto  and Farxiga  were discontinued at discharge.  He noticed progressive dyspnea on exertion and 18 pound weight gain over the last few weeks, so Dr. Marlee with [facility] on Thursday was restarted on diuretics  - His wife currently does wound care and no changes noted there  - In the ED hypertensive, labs with sodium 139, creatinine 3.7, BNP 728, chest x-ray with cardiomegaly pulmonary vascular congestion interstitial edema and pleural effusions    The patient is being diuresed, although it does not seem that he is putting out Zekiel Torian lot of fluid. Cardiology is consulted. Creatinine is elevated due to diuresis. Nephrology is consulted as well.    Nephrology determined that the patient would require dialysis. Pt went to IR on the morning of 09/18/2024 for placement of Dannetta Lekas non-tunneled HD Catheter. He was then to go for HD later that day.  Assessment & Plan:   Principal Problem:   Acute on chronic heart failure with preserved ejection fraction (HFpEF, >= 50%) (HCC) Active Problems:   CKD (chronic kidney  disease) stage 4, GFR 15-29 ml/min (HCC)   Insulin -requiring or dependent type II diabetes mellitus (HCC)   Hypertension associated with diabetes (HCC)   Hypothyroidism   Obstructive sleep apnea   Hyperlipidemia associated with type 2 diabetes mellitus (HCC)   Anemia of chronic disease  Volume Overload AKI on CKD IV  Concern for ESRD HFpEF Responded poorly to high dose diuresis Renal US  with increased echogenicity in both kidneys UA with 100 mg/dl protein, 0-5 RBC Diuresis per renal  Now s/p temp RIJ Hd catheter -> started on HD Appreciate renal recommendations, HD today, plan for tunneled catheter on Tuesday Appreciate cardiology assistance - recommending coreg , hydral - adjust dose as needed with possible hypotension with HD  Hypertension Coreg , amlodipine , hydralazine .  Diuresis per renal (lasix ).  T2DM Basal, SSI Adjust regimen as indicated  Hypothyroidism TSH elevated to 7.8 - will continue synthroid  125 micrograms daily Would repeat labs outside of acute illness, may need dose increase  Necrotizing Fasciitis of Perineum/Scrotum Completed course of abx Wound looks appropriate today Continue wound care  Surgery was to see as outpatient, may need to have them see while he's here - will check in with them on 11/3 (his follow up appt is 11/7)  Dyslipidemia Statin  Anemia  Noted, trend  OSA CPAP  Focal Mid L Renal Mass Needs renal CT or MRI as an outpatient     DVT prophylaxis: heparin  Code Status: full Family Communication: none Disposition:   Status is: Inpatient Remains inpatient appropriate because: need for continued inpatient care   Consultants:  Cardiology nephrology  Procedures: 10/31, non  tunneled central venous HD catheter via right internal jugular vein  Antimicrobials:  Anti-infectives (From admission, onward)    Start     Dose/Rate Route Frequency Ordered Stop   09/18/24 1215  ceFAZolin (ANCEF) IVPB 2g/100 mL premix        2 g 200  mL/hr over 30 Minutes Intravenous  Once 09/18/24 1116         Subjective: Denies any complaints during dialysis   Objective: Vitals:   09/21/24 1100 09/21/24 1200 09/21/24 1215 09/21/24 1222  BP: 131/66 139/72 122/79 135/77  Pulse: 66 67 63 65  Resp: 16 (!) 21 13 14   Temp:   (!) 97.4 F (36.3 C)   TempSrc:   Oral   SpO2: 99% 98% 100% 100%  Weight:   127.7 kg   Height:        Intake/Output Summary (Last 24 hours) at 09/21/2024 1333 Last data filed at 09/21/2024 1215 Gross per 24 hour  Intake 482.17 ml  Output 4750 ml  Net -4267.83 ml   Filed Weights   09/20/24 0644 09/21/24 0751 09/21/24 1215  Weight: 134.5 kg 131.2 kg 127.7 kg    Examination:  General: No acute distress. Seen during dialysis.  Lungs: unlabored Neurological: Alert and oriented 3. Moves all extremities 4 with equal strength. Cranial nerves II through XII grossly intact.  Data Reviewed: I have personally reviewed following labs and imaging studies  CBC: Recent Labs  Lab 09/16/24 0250 09/17/24 0322 09/18/24 1541 09/19/24 0633 09/21/24 0316  WBC 10.0 8.7 8.9 9.3 11.1*  NEUTROABS  --   --   --  6.1  --   HGB 8.2* 8.8* 8.5* 8.2* 8.6*  HCT 24.6* 25.7* 25.9* 24.5* 25.7*  MCV 84.2 82.4 85.2 83.9 83.4  PLT 262 284 287 278 258    Basic Metabolic Panel: Recent Labs  Lab 09/16/24 0250 09/17/24 0322 09/18/24 0319 09/19/24 0633 09/20/24 1142 09/21/24 0316  NA 141 141 141 137 136 136  K 3.4* 3.6 3.6 3.8 3.7 3.4*  CL 111 108 106 101 98 99  CO2 18* 19* 20* 23 26 23   GLUCOSE 155* 283* 189* 334* 291* 235*  BUN 40* 43* 44* 33* 30* 34*  CREATININE 3.75* 3.98* 4.09* 3.59* 3.71* 4.15*  CALCIUM 8.2* 8.2* 8.5* 7.8* 8.3* 8.3*  MG 1.8  --   --   --   --   --     GFR: Estimated Creatinine Clearance: 23.9 mL/min (Braeton Wol[place]ott) (by C-G formula based on SCr of 4.15 mg/dL (H)).  Liver Function Tests: Recent Labs  Lab 09/15/24 1846  AST 31  ALT 34  ALKPHOS 82  BILITOT 1.0  PROT 6.3*  ALBUMIN 2.6*     CBG: Recent Labs  Lab 09/19/24 2124 09/20/24 0637 09/20/24 1215 09/20/24 1654 09/20/24 2039  GLUCAP 279* 289* 291* 296* 272*     No results found for this or any previous visit (from the past 240 hours).       Radiology Studies: No results found.       Scheduled Meds:  amLODipine   5 mg Oral Daily   carvedilol   12.5 mg Oral BID WC   Chlorhexidine  Gluconate Cloth  6 each Topical Q0600   dorzolamide  1 drop Both Eyes Daily   heparin   5,000 Units Subcutaneous Q8H   hydrALAZINE   50 mg Oral BID   insulin  aspart  0-5 Units Subcutaneous QHS   insulin  aspart  0-9 Units Subcutaneous TID WC   insulin  glargine-yfgn  10 Units  Subcutaneous Daily   levothyroxine   125 mcg Oral Q0600   simvastatin   20 mg Oral QPM   sodium chloride  flush  3 mL Intravenous Q12H   timolol   1 drop Both Eyes Daily   Continuous Infusions:   ceFAZolin (ANCEF) IV     furosemide  Stopped (09/20/24 1829)   iron sucrose       LOS: 6 days    Time spent: over 30 min     Meliton Monte, MD [facility]   To contact the attending provider between 7A-7P or the covering provider during after hours 7P-7A, please log into the web site [email or web address] and access using universal [place] password for that web site. If you do not have the password, please call the hospital operator.  09/21/2024, 1:33 PM

## 2024-09-21 NOTE — Plan of Care (Signed)
  Problem: Education: Goal: Ability to describe self-care measures that may prevent or decrease complications (Diabetes Survival Skills Education) will improve Outcome: Progressing Goal: Individualized Educational Video(s) Outcome: Progressing   Problem: Coping: Goal: Ability to adjust to condition or change in health will improve Outcome: Progressing   Problem: Fluid Volume: Goal: Ability to maintain a balanced intake and output will improve Outcome: Progressing   Problem: Health Behavior/Discharge Planning: Goal: Ability to identify and utilize available resources and services will improve Outcome: Progressing Goal: Ability to manage health-related needs will improve Outcome: Progressing   Problem: Metabolic: Goal: Ability to maintain appropriate glucose levels will improve Outcome: Progressing   Problem: Nutritional: Goal: Maintenance of adequate nutrition will improve Outcome: Progressing Goal: Progress toward achieving an optimal weight will improve Outcome: Progressing   Problem: Skin Integrity: Goal: Risk for impaired skin integrity will decrease Outcome: Progressing   Problem: Tissue Perfusion: Goal: Adequacy of tissue perfusion will improve Outcome: Progressing   Problem: Education: Goal: Knowledge of General Education information will improve Description: Including pain rating scale, medication(s)/side effects and non-pharmacologic comfort measures Outcome: Progressing   Problem: Health Behavior/Discharge Planning: Goal: Ability to manage health-related needs will improve Outcome: Progressing   Problem: Clinical Measurements: Goal: Ability to maintain clinical measurements within normal limits will improve Outcome: Progressing Goal: Will remain free from infection Outcome: Progressing Goal: Diagnostic test results will improve Outcome: Progressing Goal: Respiratory complications will improve Outcome: Progressing Goal: Cardiovascular complication will  be avoided Outcome: Progressing   Problem: Activity: Goal: Risk for activity intolerance will decrease Outcome: Progressing   Problem: Nutrition: Goal: Adequate nutrition will be maintained Outcome: Progressing   Problem: Coping: Goal: Level of anxiety will decrease Outcome: Progressing   Problem: Elimination: Goal: Will not experience complications related to bowel motility Outcome: Progressing Goal: Will not experience complications related to urinary retention Outcome: Progressing   Problem: Pain Managment: Goal: General experience of comfort will improve and/or be controlled Outcome: Progressing   Problem: Safety: Goal: Ability to remain free from injury will improve Outcome: Progressing   Problem: Skin Integrity: Goal: Risk for impaired skin integrity will decrease Outcome: Progressing   Problem: Education: Goal: Ability to demonstrate management of disease process will improve Outcome: Progressing Goal: Ability to verbalize understanding of medication therapies will improve Outcome: Progressing   Problem: Activity: Goal: Capacity to carry out activities will improve Outcome: Progressing   Problem: Cardiac: Goal: Ability to achieve and maintain adequate cardiopulmonary perfusion will improve Outcome: Progressing   Problem: Education: Goal: Knowledge of disease and its progression will improve Outcome: Progressing   Problem: Health Behavior/Discharge Planning: Goal: Ability to manage health-related needs will improve Outcome: Progressing   Problem: Clinical Measurements: Goal: Complications related to the disease process or treatment will be avoided or minimized Outcome: Progressing Goal: Dialysis access will remain free of complications Outcome: Progressing   Problem: Activity: Goal: Activity intolerance will improve Outcome: Progressing   Problem: Fluid Volume: Goal: Fluid volume balance will be maintained or improved Outcome: Progressing    Problem: Nutritional: Goal: Ability to make appropriate dietary choices will improve Outcome: Progressing   Problem: Respiratory: Goal: Respiratory symptoms related to disease process will be avoided Outcome: Progressing   Problem: Self-Concept: Goal: Body image disturbance will be avoided or minimized Outcome: Progressing   Problem: Urinary Elimination: Goal: Progression of disease will be identified and treated Outcome: Progressing

## 2024-09-21 NOTE — Progress Notes (Signed)
Patient transferred to dialysis.

## 2024-09-21 NOTE — Progress Notes (Signed)
 Ruth Tully. is an 68 y.o. male HFpEF, type 2 diabetes, hypertension, hyperlipidemia, hypothyroidism, prostate cancer, anemia of chronic disease, OSA on CPAP, CKD 4 followed by Dr. Marlee last seen in early October.  Patient recently admitted with necrotizing fasciitis of the scrotum/perineum status post surgical debridement on 10/10 as well as 10/12 with culture growing multipole organisms. treated with Unasyn as well as a outpatient 2-week course of Augmentin .  Farxiga  was discontinued at time of discharge as his creatinine was slightly elevated compared to baseline which is approximately 3.  Of note finerenone , Entresto  and torsemide  were held at time of discharge.  Unfortunately he has had a 18 pound weight gain since discharge with worsening shortness of breath past couple days before presentation especially with exertion, increased swelling in the lower extremities.  Torsemide  was restarted last Friday (5 days prior to presentation) by Dr. Marlee. In the ED BP was elevated Cr 3.7 BNP 729 and CXR showed pulmonary vasc congestion.   Assessment/Plan: Acute on CKD IV followed by Dr. Marlee. Concerned he may have progressed and is now CKDV with dialysis unfortunately possible in the near future. - Unfortunately very poor response to high dose diuresis; I d/w the pt at length the need to start HD. He is markedly overloaded;  PVR 0 on 10/30. - HD #1 on 10/31 with 3L UF; HD#2 on 11/1 2.5L  - Seen on HD  3K bath through rt internal jugular temp 3.5L goal and BP 150/69 suggesting he should be able to tolerate  Will request VIR conversion  to TC on Tues and will CLIP on Mon for AKI (likely ESRD but can give him a mth); d/w Dr. Rolan and low likelihood of coming off dialysis.  [employer] boots to help mobilize the fluid -> placed AM 10/31. - Renal u/s RK 8.5cm LK 9.3cm with no e/o hydronephrosis   -Monitor Daily I/Os, Daily weight  -Maintain MAP>65 for optimal renal perfusion.  - Avoid nephrotoxic  agents such as IV contrast, NSAIDs, and phosphate containing bowel preps (FLEETS)   HFpEF EF 60-65% - followed by Dr. Bensimhon, restarted on Torsemide  40mg  daily last Friday by Dr. Marlee with initially good response; pt states ankle edema initially got better. Hypertension - initially but now much better controlled borderline relative hypotension for him. T2DM on SSI and long acting insulin  being managed by the primary. Anemia -TSAT 21% F40; don't see obvious infx. Will give 2 doses of Venofer and then ESA. Transfuse if Hb <7 OSA on CPAP  Subjective: Poor response to high dose diuretics, sob much improved since starting dialysis.  PVR 0 on 10/30. Denies f/c/n/v.   Chemistry and CBC: Creatinine, Ser  Date/Time Value Ref Range Status  09/21/2024 03:16 AM 4.15 (H) 0.61 - 1.24 mg/dL Final  88/97/7974 88:57 AM 3.71 (H) 0.61 - 1.24 mg/dL Final  88/98/7974 93:66 AM 3.59 (H) 0.61 - 1.24 mg/dL Final  89/68/7974 96:80 AM 4.09 (H) 0.61 - 1.24 mg/dL Final  89/69/7974 96:77 AM 3.98 (H) 0.61 - 1.24 mg/dL Final  89/70/7974 97:49 AM 3.75 (H) 0.61 - 1.24 mg/dL Final  89/71/7974 92:86 PM 3.90 (H) 0.61 - 1.24 mg/dL Final  89/71/7974 93:53 PM 3.70 (H) 0.61 - 1.24 mg/dL Final  89/83/7974 95:40 AM 3.63 (H) 0.61 - 1.24 mg/dL Final  89/84/7974 98:51 AM 3.55 (H) 0.61 - 1.24 mg/dL Final  89/85/7974 96:57 AM 3.81 (H) 0.61 - 1.24 mg/dL Final  89/86/7974 95:73 AM 3.52 (H) 0.61 - 1.24 mg/dL Final  89/88/7974 96:73  AM 3.55 (H) 0.61 - 1.24 mg/dL Final  89/89/7974 95:86 PM 3.79 (H) 0.61 - 1.24 mg/dL Final  94/91/7974 89:72 AM 3.08 (H) 0.61 - 1.24 mg/dL Final  95/77/7974 87:86 PM 2.88 (H) 0.61 - 1.24 mg/dL Final  96/96/7974 89:57 AM 3.09 (H) 0.61 - 1.24 mg/dL Final  91/77/7975 88:56 AM 2.68 (H) 0.61 - 1.24 mg/dL Final  93/96/7975 88:81 AM 2.74 (H) 0.61 - 1.24 mg/dL Final  94/77/7975 97:45 PM 2.83 (H) 0.61 - 1.24 mg/dL Final  94/89/7975 96:61 PM 2.82 (H) 0.61 - 1.24 mg/dL Final  94/96/7975 90:86 AM 2.75 (H) 0.61  - 1.24 mg/dL Final  95/80/7975 87:49 PM 3.08 (H) 0.61 - 1.24 mg/dL Final  95/90/7975 97:46 PM 2.84 (H) 0.61 - 1.24 mg/dL Final  96/70/7975 88:62 AM 2.88 (H) 0.61 - 1.24 mg/dL Final  96/88/7975 91:41 AM 2.91 (H) 0.61 - 1.24 mg/dL Final  97/71/7975 98:46 PM 3.45 (H) 0.61 - 1.24 mg/dL Final  97/76/7975 97:59 PM 2.93 (H) 0.61 - 1.24 mg/dL Final  97/76/7975 93:87 AM 2.78 (H) 0.61 - 1.24 mg/dL Final  97/77/7975 95:51 PM 2.63 (H) 0.61 - 1.24 mg/dL Final  97/77/7975 92:94 AM 2.47 (H) 0.61 - 1.24 mg/dL Final  97/78/7975 96:72 PM 2.32 (H) 0.61 - 1.24 mg/dL Final  97/78/7975 93:49 AM 2.36 (H) 0.61 - 1.24 mg/dL Final  97/78/7975 98:67 AM 2.36 (H) 0.61 - 1.24 mg/dL Final  97/79/7975 95:99 PM 2.23 (H) 0.61 - 1.24 mg/dL Final  97/79/7975 98:70 AM 2.43 (H) 0.61 - 1.24 mg/dL Final  97/80/7975 92:81 PM 2.43 (H) 0.61 - 1.24 mg/dL Final  89/83/7976 91:50 AM 2.17 (H) 0.76 - 1.27 mg/dL Final  90/78/7976 96:75 PM 2.14 (H) 0.76 - 1.27 mg/dL Final  93/96/7976 90:64 AM 2.90 (H) 0.61 - 1.24 mg/dL Final  96/91/7976 88:92 AM 2.46 (H) 0.76 - 1.27 mg/dL Final  89/70/7978 90:60 AM 2.26 (H) 0.61 - 1.24 mg/dL Final  89/92/7978 88:55 AM 2.57 (H) 0.76 - 1.27 mg/dL Final  91/87/7978 89:79 AM 2.68 (H) 0.61 - 1.24 mg/dL Final  91/94/7978 90:82 AM 2.71 (H) 0.61 - 1.24 mg/dL Final  92/70/7978 87:77 PM 2.32 (H) 0.61 - 1.24 mg/dL Final  94/74/7978 89:46 AM 2.41 (H) 0.61 - 1.24 mg/dL Final  94/81/7978 95:94 PM 2.41 (H) 0.61 - 1.24 mg/dL Final  94/90/7978 94:57 AM 2.19 (H) 0.61 - 1.24 mg/dL Final  94/91/7978 90:97 PM 2.27 (H) 0.61 - 1.24 mg/dL Final  94/91/7978 95:54 PM 2.11 (H) 0.61 - 1.24 mg/dL Final  94/91/7978 93:62 AM 2.20 (H) 0.61 - 1.24 mg/dL Final   Recent Labs  Lab 09/15/24 1846 09/15/24 1913 09/16/24 0250 09/17/24 0322 09/18/24 0319 09/19/24 0633 09/20/24 1142 09/21/24 0316  NA 139 140 141 141 141 137 136 136  K 3.8 3.8 3.4* 3.6 3.6 3.8 3.7 3.4*  CL 108 107 111 108 106 101 98 99  CO2 18*  --  18* 19* 20*  23 26 23   GLUCOSE 174* 167* 155* 283* 189* 334* 291* 235*  BUN 41* 51* 40* 43* 44* 33* 30* 34*  CREATININE 3.70* 3.90* 3.75* 3.98* 4.09* 3.59* 3.71* 4.15*  CALCIUM 8.1*  --  8.2* 8.2* 8.5* 7.8* 8.3* 8.3*   Recent Labs  Lab 09/17/24 0322 09/18/24 1541 09/19/24 0633 09/21/24 0316  WBC 8.7 8.9 9.3 11.1*  NEUTROABS  --   --  6.1  --   HGB 8.8* 8.5* 8.2* 8.6*  HCT 25.7* 25.9* 24.5* 25.7*  MCV 82.4 85.2 83.9 83.4  PLT 284 287 278  258   Liver Function Tests: Recent Labs  Lab 09/15/24 1846  AST 31  ALT 34  ALKPHOS 82  BILITOT 1.0  PROT 6.3*  ALBUMIN 2.6*   No results for input(s): LIPASE, AMYLASE in the last 168 hours. No results for input(s): AMMONIA in the last 168 hours. Cardiac Enzymes: No results for input(s): CKTOTAL, CKMB, CKMBINDEX, TROPONINI in the last 168 hours. Iron Studies:  No results for input(s): IRON, TIBC, TRANSFERRIN, FERRITIN in the last 72 hours.  PT/INR: @LABRCNTIP (inr:5)  Xrays/Other Studies: ) Results for orders placed or performed during the hospital encounter of 09/15/24 (from the past 48 hours)  Glucose, capillary     Status: Abnormal   Collection Time: 09/19/24 12:06 PM  Result Value Ref Range   Glucose-Capillary 156 (H) 70 - 99 mg/dL    Comment: Glucose reference range applies only to samples taken after fasting for at least 8 hours.  Glucose, capillary     Status: Abnormal   Collection Time: 09/19/24  3:33 PM  Result Value Ref Range   Glucose-Capillary 285 (H) 70 - 99 mg/dL    Comment: Glucose reference range applies only to samples taken after fasting for at least 8 hours.  Glucose, capillary     Status: Abnormal   Collection Time: 09/19/24  9:24 PM  Result Value Ref Range   Glucose-Capillary 279 (H) 70 - 99 mg/dL    Comment: Glucose reference range applies only to samples taken after fasting for at least 8 hours.   Comment 1 Notify RN    Comment 2 Document in Chart   Glucose, capillary     Status: Abnormal    Collection Time: 09/20/24  6:37 AM  Result Value Ref Range   Glucose-Capillary 289 (H) 70 - 99 mg/dL    Comment: Glucose reference range applies only to samples taken after fasting for at least 8 hours.   Comment 1 Notify RN    Comment 2 Document in Chart   Basic metabolic panel with GFR     Status: Abnormal   Collection Time: 09/20/24 11:42 AM  Result Value Ref Range   Sodium 136 135 - 145 mmol/L   Potassium 3.7 3.5 - 5.1 mmol/L   Chloride 98 98 - 111 mmol/L   CO2 26 22 - 32 mmol/L   Glucose, Bld 291 (H) 70 - 99 mg/dL    Comment: Glucose reference range applies only to samples taken after fasting for at least 8 hours.   BUN 30 (H) 8 - 23 mg/dL   Creatinine, Ser 6.28 (H) 0.61 - 1.24 mg/dL   Calcium 8.3 (L) 8.9 - 10.3 mg/dL   GFR, Estimated 17 (L) >60 mL/min    Comment: (NOTE) Calculated using the CKD-EPI Creatinine Equation (2021)    Anion gap 12 5 - 15    Comment: Performed at [facility], [address], [place], [place] [address]  Glucose, capillary     Status: Abnormal   Collection Time: 09/20/24 12:15 PM  Result Value Ref Range   Glucose-Capillary 291 (H) 70 - 99 mg/dL    Comment: Glucose reference range applies only to samples taken after fasting for at least 8 hours.  Glucose, capillary     Status: Abnormal   Collection Time: 09/20/24  4:54 PM  Result Value Ref Range   Glucose-Capillary 296 (H) 70 - 99 mg/dL    Comment: Glucose reference range applies only to samples taken after fasting for at least 8 hours.  Glucose, capillary  Status: Abnormal   Collection Time: 09/20/24  8:39 PM  Result Value Ref Range   Glucose-Capillary 272 (H) 70 - 99 mg/dL    Comment: Glucose reference range applies only to samples taken after fasting for at least 8 hours.   Comment 1 Document in Chart   Basic metabolic panel with GFR     Status: Abnormal   Collection Time: 09/21/24  3:16 AM  Result Value Ref Range   Sodium 136 135 - 145 mmol/L   Potassium 3.4 (L) 3.5 - 5.1 mmol/L    Chloride 99 98 - 111 mmol/L   CO2 23 22 - 32 mmol/L   Glucose, Bld 235 (H) 70 - 99 mg/dL    Comment: Glucose reference range applies only to samples taken after fasting for at least 8 hours.   BUN 34 (H) 8 - 23 mg/dL   Creatinine, Ser 5.84 (H) 0.61 - 1.24 mg/dL   Calcium 8.3 (L) 8.9 - 10.3 mg/dL   GFR, Estimated 15 (L) >60 mL/min    Comment: (NOTE) Calculated using the CKD-EPI Creatinine Equation (2021)    Anion gap 14 5 - 15    Comment: Performed at [facility], [address], [place], [place] [address]  CBC     Status: Abnormal   Collection Time: 09/21/24  3:16 AM  Result Value Ref Range   WBC 11.1 (H) 4.0 - 10.5 K/uL   RBC 3.08 (L) 4.22 - 5.81 MIL/uL   Hemoglobin 8.6 (L) 13.0 - 17.0 g/dL   HCT 74.2 (L) 60.9 - 47.9 %   MCV 83.4 80.0 - 100.0 fL   MCH 27.9 26.0 - 34.0 pg   MCHC 33.5 30.0 - 36.0 g/dL   RDW 84.0 (H) 88.4 - 84.4 %   Platelets 258 150 - 400 K/uL   nRBC 0.0 0.0 - 0.2 %    Comment: Performed at [facility], [address], [place], [place] [address]   No results found.    PMH:   Past Medical History:  Diagnosis Date   Acanthosis nigricans    Atopic dermatitis    CHF (congestive heart failure) (HCC)    CKD (chronic kidney disease) stage 4, GFR 15-29 ml/min (HCC) 01/08/2023   Diabetes (HCC) 11/19/2002   Erectile dysfunction    Fatty liver 11/19/2005   Glaucoma 11/19/2006   Gynecomastia 11/20/2007   Hx of adenomatous colonic polyps 08/26/2023   Hyperaldosteronism 11/19/1998   Hypercholesterolemia    Hyperlipidemia 2010   Hypertension 1999   Hypoglycemic reaction    Hypothyroidism 11/19/2002   Incontinence    Intermittent vertigo 11/19/2010   Left cervical radiculopathy 11/19/2010   Lumbar radiculopathy    Obesity    Peripheral neuropathy    Pollen allergies 11/19/2005   perennial   Prostate cancer (HCC) 11/19/2004   Reflux esophagitis 11/19/1993   Sickle cell trait 11/19/2004   Sleep apnea, obstructive 11/19/1998   uses a cpap    Venous insufficiency 11/20/2003   Vitamin D deficiency 11/19/2010    PSH:   Past Surgical History:  Procedure Laterality Date   COLONOSCOPY  2006   normal   INCISION AND DRAINAGE OF WOUND N/A 08/28/2024   Procedure: IRRIGATION AND EXCISIONAL DEBRIDEMENT WOUND;  Surgeon: Lyndel Deward PARAS, MD;  Location: [facility] OR;  Service: General;  Laterality: N/A;  EXCISIONAL DEBRIDEMENT   INCISION AND DRAINAGE PERIRECTAL ABSCESS N/A 08/30/2024   Procedure: INCISION AND DRAINAGE OF PERINEAL WOUND;  Surgeon: Polly Cordella LABOR, MD;  Location:  [facility] OR;  Service: General;  Laterality: N/A;   IR TUNNELED CENTRAL VENOUS CATH PLC W IMG  09/18/2024   lap band surgery  2009   left inguinal hernia repair  1998   LIPOMA EXCISION Left 04/17/2023   Procedure: MINOR EXCISION LEFT BUTTOCK SEBACEOUS CYST;  Surgeon: Lyndel Deward PARAS, MD;  Location: [facility];  Service: General;  Laterality: Left;   robotic prostatectomy  2008    Allergies:  Allergies  Allergen Reactions   Ace Inhibitors Cough   Maxidex [Dexamethasone] Other (See Comments)    Sexual dysfunction   Verapamil Other (See Comments)    constipation    Medications:   Prior to Admission medications   Medication Sig Start Date End Date Taking? Authorizing Provider  acetaminophen  (TYLENOL ) 500 MG tablet Take 500 mg by mouth every 6 (six) hours as needed for mild pain.   Yes [provider]  amLODipine  (NORVASC ) 5 MG tablet Take 1 tablet (5 mg total) by mouth daily. 09/04/24 10/04/24 Yes Perri DELENA Meliton Mickey., MD  carvedilol  (COREG ) 25 MG tablet Take 1 tablet (25 mg total) by mouth 2 (two) times daily with food 04/01/24  Yes   Continuous Glucose Sensor (FREESTYLE LIBRE 3 PLUS SENSOR) MISC Apply to upper back of arm. Change every 15 days. 04/03/24  Yes   Continuous Glucose Sensor (FREESTYLE LIBRE 3 SENSOR) MISC Apply to upper back of arm and change ever 14 days. 01/28/24  Yes Faythe Purchase, MD  dorzolamide (TRUSOPT) 2 %  ophthalmic solution Place 1 drop into both eyes daily.   Yes [provider]  hydrALAZINE  (APRESOLINE ) 100 MG tablet Take 1/2 tablet (50 mg total) by mouth 2 (two) times daily. 08/30/23  Yes Clegg, Amy D, NP  insulin  glargine (LANTUS  SOLOSTAR) 100 UNIT/ML Solostar Pen Inject 15 Units into the skin daily. Follow up with your PCP outpatient for further adjustment to your regimen 09/03/24  Yes Perri DELENA Meliton Mickey., MD  insulin  lispro (HUMALOG  KWIKPEN) 200 UNIT/ML KwikPen Inject 0-15 Units into the skin 3 (three) times daily with meals. CBG < 70: treat low blood sugar CBG 70 - 120: 0 units  CBG 121 - 150: 2 units  CBG 151 - 200: 3 units  CBG 201 - 250: 5 units  CBG 251 - 300: 8 units  CBG 301 - 350: 11 units  CBG 351 - 400: 15 units  CBG >400: Call MD 09/03/24  Yes Perri DELENA Meliton Mickey., MD  levothyroxine  (SYNTHROID ) 125 MCG tablet Take 1 tablet (125 mcg total) by mouth every morning on an empty stomach 06/08/24  Yes   simvastatin  (ZOCOR ) 20 MG tablet Take 1 tablet (20 mg total) by mouth every evening. 07/09/24  Yes   timolol  (BETIMOL ) 0.5 % ophthalmic solution Place 1 drop into both eyes daily.   Yes [provider]  tirzepatide  (MOUNJARO ) 15 MG/0.5ML Pen Inject 15 mg into the skin once a week. 04/14/24  Yes   torsemide  (DEMADEX ) 20 MG tablet Take 3 tablets (60 mg total) by mouth daily. Patient taking differently: Take 40 mg by mouth daily. 08/08/23 09/15/25 Yes Milford, Harlene HERO, FNP  clobetasol  ointment (TEMOVATE ) 0.05 % Apply topically daily before sleeping. Patient not taking: Reported on 08/29/2024 05/19/24     Finerenone  (KERENDIA ) 20 MG TABS Take 20 mg by mouth daily. Patient not taking: Reported on 09/15/2024    [provider]  Insulin  Pen Needle (TECHLITE PEN NEEDLES) 32G X 4 MM MISC Use to inject insulin  4  times daily 10/25/22   Faythe Purchase, MD  Insulin  Pen Needle 32G X 4 MM MISC Use to inject insulin  up to 4 times daily. 12/26/23   Faythe Purchase, MD  potassium  chloride SA (KLOR-CON  M) 20 MEQ tablet Take 3 tablets (60 mEq total) by mouth daily. Patient not taking: Reported on 09/15/2024 03/10/24   Glena Harlene HERO, FNP  sacubitril -valsartan  (ENTRESTO ) 49-51 MG Take 1 tablet by mouth 2 (two) times daily. Patient not taking: Reported on 09/15/2024 08/25/24   Bensimhon, Toribio SAUNDERS, MD    Discontinued Meds:   Medications Discontinued During This Encounter  Medication Reason   albuterol  (VENTOLIN  HFA) 108 (90 Base) MCG/ACT inhaler 2 puff    insulin  glargine-yfgn (SEMGLEE ) injection 6 Units    timolol  (BETIMOL ) 0.5 % ophthalmic solution 1 drop    potassium chloride  (KLOR-CON  M) CR tablet 10 mEq    carvedilol  (COREG ) tablet 25 mg    dorzolamide (TRUSOPT) 2 % ophthalmic solution 1 drop    furosemide  (LASIX ) injection 80 mg    furosemide  (LASIX ) 120 mg in dextrose  5 % 50 mL IVPB    pentafluoroprop-tetrafluoroeth (GEBAUERS) aerosol 1 Application Patient Transfer   lidocaine  (PF) (XYLOCAINE ) 1 % injection 5 mL Patient Transfer   lidocaine -prilocaine (EMLA) cream 1 Application Patient Transfer   heparin  injection 1,000 Units Patient Transfer   anticoagulant sodium citrate solution 5 mL Patient Transfer   alteplase (CATHFLO ACTIVASE) injection 2 mg Patient Transfer   furosemide  (LASIX ) 120 mg in dextrose  5 % 50 mL IVPB    insulin  glargine-yfgn (SEMGLEE ) injection 6 Units     Social History:  reports that he has never smoked. He has never used smokeless tobacco. He reports current alcohol use. He reports that he does not use drugs.  Family History:   Family History  Problem Relation Age of Onset   Heart failure Mother    Hypertension Father        deceased age 47   Heart attack Father    COPD Sister    CVA Sister    Diabetes Daughter    Colon cancer Neg Hx    Stomach cancer Neg Hx    Colon polyps Neg Hx    Esophageal cancer Neg Hx    Rectal cancer Neg Hx     Blood pressure (!) 145/71, pulse 65, temperature 97.9 F (36.6 C), resp. rate 17,  height 6' 2 (1.88 m), weight 131.2 kg, SpO2 99%. Physical Exam: GEN: NAD, A&Ox3, NCAT, obese HEENT: No conjunctival pallor, EOMI NECK: Supple, no thyromegaly LUNGS: CTA B/L no rales, rhonchi or wheezing CV: RRR, No M/R/G ABD: SNDNT +BS  EXT: 1+ lower extremity edema to thighs GU: male purewick ACCESS: RIJ temp    MELIA LYNWOOD ORN, MD 09/21/2024, 9:54 AM

## 2024-09-21 NOTE — Progress Notes (Addendum)
 Contacted by nephrologist regarding CLIP need to out-pt HD clinic.   Met bedside with pt and discussed out-pt clinic placements options. Pt is seeing [facility] as nephrologist and would like to remain with him, even if this means TCU placement, as [facility] where [place] rounds may not have any normal chairs. We discussed this would mean 4x a week 3hrs tx and also how TCU is different. Pt stated he was already educated, but is agreeable to [facility]. When asked about schedule preferences, pt stated this was too much information at once. Navigator discussed in further clarity. Pt would not provide a preference regarding 1st or second shift. When asked how he would get to and from his appts, he initially stated he did not know, when asked again, he stated he would drive himself if he could. Pt stated he is ready to go home throughout this discussion, and was informed by navigator that he will need an HD clinic confirmed before d/c.   Navigator will now submit referral for a MWF spot at TCU at [facility]. Will continue to assist and update as needed.   Lavanda Kambrea Carrasco Dialysis Navigator [phone]  Addendum 1157am CLIP completed to [facility] admissions. Awaiting financial and medical clearance at this time. Will continue to assist as needed.   Addendum  Pt accepted at [facility] 4x week MTTF, anticipated start date 09/24/24. Chair time 0830 am. Will need to arrive 0800 am for first appt. Met bedside with pt and provided printed schedule letter at this time. Will continue to assist as needed. AVS updated. Nephrology informed.

## 2024-09-21 NOTE — Progress Notes (Signed)
 Received patient in bed to unit.  Alert and oriented.  Informed consent signed and in chart.   TX duration:4 hours  Patient tolerated well.  Transported back to the room  Alert, without acute distress.  Hand-off given to patient's nurse.   Access used: Right internal jugular HD cath  Access issues: none  Total UF removed: Medication(s) given: 1.3 cc lumen dwells   09/21/24 1215  Vitals  Temp (!) 97.4 F (36.3 C)  Temp Source Oral  BP 122/79  MAP (mmHg) 91  Pulse Rate 63  ECG Heart Rate 67  Resp 13  Oxygen Therapy  SpO2 100 %  O2 Device Room Air  During Treatment Monitoring  Dialysate Potassium Concentration 3  Dialysate Calcium Concentration 2.5  Duration of HD Treatment -hour(s) 4 hour(s)  Cumulative Fluid Removed (mL) per Treatment  3500.23  HD Safety Checks Performed Yes  Intra-Hemodialysis Comments Tx initiated  Post Treatment  Dialyzer Clearance Lightly streaked  Liters Processed 72  Fluid Removed (mL) 3500 mL  Tolerated HD Treatment Yes  Hemodialysis Catheter Right Internal jugular Triple lumen Temporary (Non-Tunneled)  Placement Date/Time: 09/18/24 1147   Serial / Lot #: [id number]  Expiration Date: 06/18/28  Time Out: Correct patient;Correct site;Correct procedure  Maximum sterile barrier precautions: Hand hygiene;Cap;Mask;Sterile gown;Sterile gloves;Large sterile s...  Site Condition No complications  Blue Lumen Status Flushed;Antimicrobial dead end cap;Heparin  locked  Red Lumen Status Flushed;Heparin  locked;Antimicrobial dead end cap  Purple Lumen Status Saline locked  Catheter fill solution Heparin  1000 units/ml  Catheter fill volume (Arterial) 1.3 cc  Catheter fill volume (Venous) 1.3  Dressing Type Transparent  Dressing Status Antimicrobial disc/dressing in place;Clean, Dry, Intact  Drainage Description None  Dressing Change Due 09/28/24  Post treatment catheter status Capped and Clamped     Camellia Brasil LPN [facility]

## 2024-09-21 NOTE — Progress Notes (Signed)
 [facility] night cross cover note:   I was notified by the patient's RN of the patient's evening blood sugar of 474.   Per brief chart review, it appears that patient's blood sugar has been elevating throughout today, without any hypoglycemic data points over the last few days.   I subsequently ordered a total of 10 units of sq novolog  x 1 dose now as well as repeat cbg to be checked in about one hour, with request to be notified if repeat CBG remains greater than 400.     Eva Pore, DO Hospitalist

## 2024-09-22 ENCOUNTER — Inpatient Hospital Stay (HOSPITAL_COMMUNITY)

## 2024-09-22 ENCOUNTER — Other Ambulatory Visit (HOSPITAL_COMMUNITY): Payer: Self-pay

## 2024-09-22 DIAGNOSIS — I5033 Acute on chronic diastolic (congestive) heart failure: Secondary | ICD-10-CM | POA: Diagnosis not present

## 2024-09-22 HISTORY — PX: IR TUNNELED CENTRAL VENOUS CATH PLC W IMG: IMG1939

## 2024-09-22 LAB — COMPREHENSIVE METABOLIC PANEL WITH GFR
ALT: 20 U/L (ref 0–44)
AST: 13 U/L — ABNORMAL LOW (ref 15–41)
Albumin: 2.3 g/dL — ABNORMAL LOW (ref 3.5–5.0)
Alkaline Phosphatase: 70 U/L (ref 38–126)
Anion gap: 11 (ref 5–15)
BUN: 32 mg/dL — ABNORMAL HIGH (ref 8–23)
CO2: 27 mmol/L (ref 22–32)
Calcium: 8.3 mg/dL — ABNORMAL LOW (ref 8.9–10.3)
Chloride: 98 mmol/L (ref 98–111)
Creatinine, Ser: 3.65 mg/dL — ABNORMAL HIGH (ref 0.61–1.24)
GFR, Estimated: 17 mL/min — ABNORMAL LOW (ref >60)
Glucose, Bld: 216 mg/dL — ABNORMAL HIGH (ref 70–99)
Potassium: 3.6 mmol/L (ref 3.5–5.1)
Sodium: 136 mmol/L (ref 135–145)
Total Bilirubin: 0.7 mg/dL (ref 0.0–1.2)
Total Protein: 6.2 g/dL — ABNORMAL LOW (ref 6.5–8.1)

## 2024-09-22 LAB — CBC WITH DIFFERENTIAL/PLATELET
Abs Immature Granulocytes: 0.06 K/uL (ref 0.00–0.07)
Basophils Absolute: 0.1 K/uL (ref 0.0–0.1)
Basophils Relative: 1 %
Eosinophils Absolute: 0.9 K/uL — ABNORMAL HIGH (ref 0.0–0.5)
Eosinophils Relative: 10 %
HCT: 24.8 % — ABNORMAL LOW (ref 39.0–52.0)
Hemoglobin: 8.3 g/dL — ABNORMAL LOW (ref 13.0–17.0)
Immature Granulocytes: 1 %
Lymphocytes Relative: 15 %
Lymphs Abs: 1.4 K/uL (ref 0.7–4.0)
MCH: 28.2 pg (ref 26.0–34.0)
MCHC: 33.5 g/dL (ref 30.0–36.0)
MCV: 84.4 fL (ref 80.0–100.0)
Monocytes Absolute: 1.3 K/uL — ABNORMAL HIGH (ref 0.1–1.0)
Monocytes Relative: 14 %
Neutro Abs: 5.6 K/uL (ref 1.7–7.7)
Neutrophils Relative %: 59 %
Platelets: 249 K/uL (ref 150–400)
RBC: 2.94 MIL/uL — ABNORMAL LOW (ref 4.22–5.81)
RDW: 15.8 % — ABNORMAL HIGH (ref 11.5–15.5)
WBC: 9.2 K/uL (ref 4.0–10.5)
nRBC: 0 % (ref 0.0–0.2)

## 2024-09-22 LAB — MAGNESIUM: Magnesium: 1.8 mg/dL (ref 1.7–2.4)

## 2024-09-22 LAB — PHOSPHORUS: Phosphorus: 4.4 mg/dL (ref 2.5–4.6)

## 2024-09-22 LAB — GLUCOSE, CAPILLARY
Glucose-Capillary: 222 mg/dL — ABNORMAL HIGH (ref 70–99)
Glucose-Capillary: 228 mg/dL — ABNORMAL HIGH (ref 70–99)

## 2024-09-22 MED ORDER — MIDAZOLAM HCL 2 MG/2ML IJ SOLN
INTRAMUSCULAR | Status: AC
  Filled 2024-09-22: qty 2

## 2024-09-22 MED ORDER — HEPARIN SODIUM (PORCINE) 1000 UNIT/ML IJ SOLN
INTRAMUSCULAR | Status: AC
  Filled 2024-09-22: qty 10

## 2024-09-22 MED ORDER — LIDOCAINE-EPINEPHRINE 1 %-1:100000 IJ SOLN: 20.0000 mL | Freq: Once | INTRAMUSCULAR | Status: DC

## 2024-09-22 MED ORDER — LIDOCAINE HCL 1 % IJ SOLN
20.0000 mL | Freq: Once | INTRAMUSCULAR | Status: AC
  Administered 2024-09-22: 5 mL

## 2024-09-22 MED ORDER — LIDOCAINE-EPINEPHRINE 1 %-1:100000 IJ SOLN
INTRAMUSCULAR | Status: AC
  Filled 2024-09-22: qty 1

## 2024-09-22 MED ORDER — LIDOCAINE HCL 1 % IJ SOLN
INTRAMUSCULAR | Status: AC
  Filled 2024-09-22: qty 20

## 2024-09-22 MED ORDER — MIDAZOLAM HCL (PF) 2 MG/2ML IJ SOLN
INTRAMUSCULAR | Status: AC | PRN
  Administered 2024-09-22 (×2): .5 mg via INTRAVENOUS

## 2024-09-22 MED ORDER — CARVEDILOL 12.5 MG PO TABS
12.5000 mg | ORAL_TABLET | Freq: Two times a day (BID) | ORAL | 0 refills | Status: DC
  Filled 2024-09-22: qty 60, 30d supply, fill #0

## 2024-09-22 MED ORDER — FENTANYL CITRATE (PF) 100 MCG/2ML IJ SOLN
INTRAMUSCULAR | Status: AC
  Filled 2024-09-22: qty 2

## 2024-09-22 MED ORDER — INSULIN ASPART 100 UNIT/ML IJ SOLN: 4.0000 [IU] | Freq: Three times a day (TID) | INTRAMUSCULAR | Status: DC

## 2024-09-22 MED ORDER — CEFAZOLIN SODIUM-DEXTROSE 2-4 GM/100ML-% IV SOLN
INTRAVENOUS | Status: AC
  Filled 2024-09-22: qty 100

## 2024-09-22 MED ORDER — CEFAZOLIN SODIUM-DEXTROSE 2-4 GM/100ML-% IV SOLN
INTRAVENOUS | Status: AC | PRN
Start: 2024-09-22 — End: 2024-09-22
  Administered 2024-09-22: 2 g via INTRAVENOUS

## 2024-09-22 MED ORDER — INSULIN GLARGINE-YFGN 100 UNIT/ML ~~LOC~~ SOLN
20.0000 [IU] | Freq: Every day | SUBCUTANEOUS | Status: DC
  Administered 2024-09-22: 20 [IU] via SUBCUTANEOUS
  Filled 2024-09-22: qty 0.2

## 2024-09-22 MED ORDER — FENTANYL CITRATE (PF) 100 MCG/2ML IJ SOLN
INTRAMUSCULAR | Status: AC | PRN
  Administered 2024-09-22 (×2): 25 ug via INTRAVENOUS

## 2024-09-22 NOTE — Progress Notes (Signed)
 Did wound change with wife  patient tolerated well

## 2024-09-22 NOTE — Discharge Summary (Signed)
 Physician Discharge Summary  [name] FMW:[id number] DOB: [birth year] DOA: 09/15/2024  PCP: Rexanne Ingle, MD  Admit date: 09/15/2024 Discharge date: 09/22/2024  Time spent: 40 minutes  Recommendations for Outpatient Follow-up:  Follow outpatient CBC with diff/CMP  Follow volume status outpatient with dialysis  Renal follow up Follow with surgery outpatient for his wound, continue wound care Follow blood pressure, adjust regimen as needed Follow blood sugars - adjust regimen as appropriate Follow eosinophils outpatient, workup additionally if persistent Follow repeat TSH outpatient, adjust synthroid  as appropriate Focal mid L renal mass, needs outpatient CT or MRI   Discharge Diagnoses:  Principal Problem:   Acute on chronic heart failure with preserved ejection fraction (HFpEF, >= 50%) (HCC) Active Problems:   CKD (chronic kidney disease) stage 4, GFR 15-29 ml/min (HCC)   Insulin -requiring or dependent type II diabetes mellitus (HCC)   Hypertension associated with diabetes (HCC)   Hypothyroidism   Obstructive sleep apnea   Hyperlipidemia associated with type 2 diabetes mellitus (HCC)   Anemia of chronic disease   Discharge Condition: stable  Diet recommendation: diabetic, renal  Filed Weights   09/20/24 0644 09/21/24 0751 09/21/24 1215  Weight: 134.5 kg 131.2 kg 127.7 kg    History of present illness:   68/M with chronic diastolic CHF, CKD 4, T2DM, HTN, HLD, hypothyroidism, prostate cancer, anemia of chronic disease, OSA on CPAP who presented to the ED for evaluation of shortness of breath and weight gain.  He was recently admitted 10/10-10/16 for sepsis due to necrotizing fasciitis of the scrotum/perineum, emergently taken to the OR for surgical debridement on 10/10.  He underwent I&D again on 10/12.  Wound culture from 10/10 with strep constellatus, MSSA, Prevotella bivia, beta-lactamase positive.  Patient was followed by ID and was treated with IV Unasyn in  hospital and discharged on total 2-week course of Augmentin .    His hospitalization complicated by AKI and his diuretics, entresto , farxiga  held at discharge.  He presented with progressive dyspnea on exertion and 18 pound weight gain over the last few weeks, diuretics were restarted with renal outpatient.    He was admitted with volume overload.  Poorly diuretic responsive.  He's now been started on dialysis.  Stable for discharge on 11/4.    See below for additional details.   Hospital Course:  Assessment and Plan:  Volume Overload AKI on CKD IV  Concern for ESRD HFpEF Responded poorly to high dose diuresis Renal US  with increased echogenicity in both kidneys UA with 100 mg/dl protein, 0-5 RBC Appreciate renal recommendations, s/p tunneled dialysis catheter 11/4, ok to discharge per my discussion with renal.  Has dialysis on 11/6 as outpatient. Appreciate cardiology assistance - recommending coreg , hydral - adjust dose as needed with possible hypotension with HD   Hypertension Coreg  (reduce to 12.5 mg BID), amlodipine , hydralazine .     T2DM Basal, SSI Adjust regimen as indicated   Hypothyroidism TSH elevated to 7.8 - will continue synthroid  125 micrograms daily Would repeat labs outside of acute illness, may need dose increase   Necrotizing Fasciitis of Perineum/Scrotum Completed course of abx Wound looks appropriate today Continue wound care  Surgery follow up outpatient has been rescheduled, see d/c instructions   Dyslipidemia Statin   Anemia  Noted, trend   OSA CPAP   Focal Mid L Renal Mass Needs renal CT or MRI as an outpatient   Obesity, Class II Body mass index is 36.15 kg/m.    Procedures: 10/31 Non-tunneled HD catheter placement   11/4  Tunneled HD catheter placement     Consultations: [facility] Nephrology cardiology  Discharge Exam: Vitals:   09/22/24 1230 09/22/24 1240  BP: (!) 125/91 118/70  Pulse: 67 67  Resp: 12 11  Temp:    SpO2:  98% 96%   No new complaints  General: No acute distress. Cardiovascular: RRR Lungs: unlabored Abdomen: s/nt/nd Wound not examined today Neurological: Alert and oriented 3. Moves all extremities 4 with equal strength. Cranial nerves II through XII grossly intact. Extremities: unna boots to bilateral LE   Discharge Instructions   Discharge Instructions     Call MD for:  difficulty breathing, headache or visual disturbances   Complete by: As directed    Call MD for:  extreme fatigue   Complete by: As directed    Call MD for:  hives   Complete by: As directed    Call MD for:  persistant dizziness or light-headedness   Complete by: As directed    Call MD for:  persistant nausea and vomiting   Complete by: As directed    Call MD for:  redness, tenderness, or signs of infection (pain, swelling, redness, odor or green/yellow discharge around incision site)   Complete by: As directed    Call MD for:  severe uncontrolled pain   Complete by: As directed    Call MD for:  temperature >100.4   Complete by: As directed    Diet - low sodium heart healthy   Complete by: As directed    Discharge instructions   Complete by: As directed    You came into the hospital for volume overload.  You've been transitioned to dialysis.  You had Ezzard Ditmer tunneled dialysis catheter placed today.    You'll start outpatient dialysis on Thursday, 09/24/2024 as scheduled.    You had Yogi Arther surgery follow up appointment this Friday, but surgery has rescheduled this (see the discharge instructions).  Continue your wound care and call them if you have issues.  Your blood sugars are high.  Continue your insulin  and sliding scale insulin .  You need close follow up and adjustment of your insulin  regimen.  Good blood sugar control is important for wound healing.   Your TSH (thyroid  hormone test) was abnormal.  Continue your synthroid , you need repeat labs as an outpatient.   You also had elevated eosinophils, these should  be repeated outpatient with your PCP.  We reduced your coreg  dose.  Follow your blood pressures outpatient.   You have Kendrick Remigio renal mass on imaging that should be followed up as an outpatient with Dynastee Brummell CT or MRI.   Return for new, recurrent, or worsening symptoms.  Please ask your PCP to request records from this hospitalization so they know what was done and what the next steps will be.   Discharge wound care:   Complete by: As directed    Cleanse with Vashe (lawson # [phone]), pack wound with Vashe soaked gauze, use cotton tipped applicator to advance gauze to full depth of wound.  Cover with dry gauze and ABD pad and tape.  Change daily and as needed for soiling.   Increase activity slowly   Complete by: As directed       Allergies as of 09/22/2024       Reactions   Ace Inhibitors Cough   Maxidex [dexamethasone] Other (See Comments)   Sexual dysfunction   Verapamil Other (See Comments)   constipation        Medication List     STOP taking  these medications    Kerendia  20 MG Tabs Generic drug: Finerenone    potassium chloride  SA 20 MEQ tablet Commonly known as: KLOR-CON  M   sacubitril -valsartan  49-51 MG Commonly known as: ENTRESTO    torsemide  20 MG tablet Commonly known as: DEMADEX        TAKE these medications    acetaminophen  500 MG tablet Commonly known as: TYLENOL  Take 500 mg by mouth every 6 (six) hours as needed for mild pain.   amLODipine  5 MG tablet Commonly known as: NORVASC  Take 1 tablet (5 mg total) by mouth daily.   carvedilol  12.5 MG tablet Commonly known as: COREG  Take 1 tablet (12.5 mg total) by mouth 2 (two) times daily with Frantz Quattrone meal. Follow your blood pressure outpatient and get refills and adjustments with your PCP outpatient What changed:  medication strength how much to take additional instructions   clobetasol  ointment 0.05 % Commonly known as: TEMOVATE  Apply topically daily before sleeping.   dorzolamide 2 % ophthalmic  solution Commonly known as: TRUSOPT Place 1 drop into both eyes daily.   FreeStyle Libre 3 Sensor Misc Apply to upper back of arm and change ever 14 days.   FreeStyle Libre 3 Plus Sensor Misc Apply to upper back of arm. Change every 15 days.   HumaLOG  KwikPen 200 UNIT/ML KwikPen Generic drug: insulin  lispro Inject 0-15 Units into the skin 3 (three) times daily with meals. CBG < 70: treat low blood sugar CBG 70 - 120: 0 units  CBG 121 - 150: 2 units  CBG 151 - 200: 3 units  CBG 201 - 250: 5 units  CBG 251 - 300: 8 units  CBG 301 - 350: 11 units  CBG 351 - 400: 15 units  CBG >400: Call MD   hydrALAZINE  100 MG tablet Commonly known as: APRESOLINE  Take 1/2 tablet (50 mg total) by mouth 2 (two) times daily. What changed: when to take this   Insupen Pen Needles 32G X 4 MM Misc Generic drug: Insulin  Pen Needle Use to inject insulin  4 times daily   Insupen Pen Needles 32G X 4 MM Misc Generic drug: Insulin  Pen Needle Use to inject insulin  up to 4 times daily.   Lantus  SoloStar 100 UNIT/ML Solostar Pen Generic drug: insulin  glargine Inject 15 Units into the skin daily. Follow up with your PCP outpatient for further adjustment to your regimen   levothyroxine  125 MCG tablet Commonly known as: SYNTHROID  Take 1 tablet (125 mcg total) by mouth every morning on an empty stomach   Mounjaro  15 MG/0.5ML Pen Generic drug: tirzepatide  Inject 15 mg into the skin once Kalynn Declercq week.   simvastatin  20 MG tablet Commonly known as: ZOCOR  Take 1 tablet (20 mg total) by mouth every evening.   timolol  0.5 % ophthalmic solution Commonly known as: BETIMOL  Place 1 drop into both eyes daily.               Discharge Care Instructions  (From admission, onward)           Start     Ordered   09/22/24 0000  Discharge wound care:       Comments: Cleanse with Vashe (lawson # [phone]), pack wound with Vashe soaked gauze, use cotton tipped applicator to advance gauze to full depth of wound.   Cover with dry gauze and ABD pad and tape.  Change daily and as needed for soiling.   09/22/24 1615           Allergies  Allergen Reactions  Ace Inhibitors Cough   Maxidex [Dexamethasone] Other (See Comments)    Sexual dysfunction   Verapamil Other (See Comments)    constipation    Follow-up Information     Maczis, Puja Gosai, PA-C Follow up on 10/13/2024.   Specialty: General Surgery Why: 1:30pm, Arrive 30 minutes prior to your appointment time, Please bring your insurance card and photo ID Contact information: [address] [facility] [place] [address] [phone]         [facility] Go on 09/24/2024.   Why: Please arrive 8:00 am for first appt on 11/6.   After first appt, you will continue to go every Monday, Tuesday, Thursday, and Friday each week at 8:30am. Contact information: [address] [place] [address] [phone]         [facility]  Follow up.   Why: Someone will call you to schedule resumption of care visit. Contact information: [address] [place] [address] [phone]                  The results of significant diagnostics from this hospitalization (including imaging, microbiology, ancillary and laboratory) are listed below for reference.    Significant Diagnostic Studies: IR TUNNELED CENTRAL VENOUS CATH [facility] W IMG Result Date: 09/22/2024 CLINICAL DATA:  End-stage renal disease requiring hemodialysis. Status post temporary non tunneled dialysis catheter placement on 09/18/2024. EXAM: TUNNELED CENTRAL VENOUS HEMODIALYSIS CATHETER PLACEMENT WITH ULTRASOUND AND FLUOROSCOPIC GUIDANCE ANESTHESIA/SEDATION: Moderate (conscious) sedation was employed during this procedure. Donold Marotto total of Versed 1.0 mg and Fentanyl  50 mcg was administered intravenously. Moderate Sedation Time: 15 minutes. The patient's level of consciousness and vital signs were monitored  continuously by radiology nursing throughout the procedure under my direct supervision. MEDICATIONS: 2 g IV Ancef. FLUOROSCOPY: Radiation Exposure Index: 7.0 mGy Kerma PROCEDURE: The procedure, risks, benefits, and alternatives were explained to the patient. Questions regarding the procedure were encouraged and answered. The patient understands and consents to the procedure. Ramaj Frangos timeout was performed prior to initiating the procedure. The right neck and chest were prepped with chlorhexidine  in Naresh Althaus sterile fashion, and Arzell Mcgeehan sterile drape was applied covering the operative field. Maximum barrier sterile technique with sterile gowns and gloves were used for the procedure. Local anesthesia was provided with 1% lidocaine . Ultrasound was performed to confirm patency of the right internal jugular vein. An ultrasound image was saved and recorded. After creating Solara Goodchild small venotomy incision, Linley Moskal 21 gauge needle was advanced into the right internal jugular vein under direct, real-time ultrasound guidance. After securing guidewire access, an 8 Fr dilator was placed. Keona Sheffler J-wire was kinked to measure appropriate catheter length. Keoni Havey Palindrome tunneled hemodialysis catheter measuring 23 cm from tip to cuff was chosen for placement. This was tunneled in Petronella Shuford retrograde fashion from the chest wall to the venotomy incision. At the venotomy, serial dilatation was performed and Naiah Donahoe 15 Fr peel-away sheath was placed over Trudi Morgenthaler guidewire. The catheter was then placed through the sheath and the sheath removed. Final catheter positioning was confirmed and documented with Cambren Helm fluoroscopic spot image. The catheter was aspirated, flushed with saline, and injected with appropriate volume heparin  dwells. The venotomy incision was closed with subcutaneous and subcuticular 4-0 Vicryl. Dermabond was applied to the incision. The catheter exit site was secured with 0-Prolene retention sutures. COMPLICATIONS: None.  No pneumothorax. FINDINGS: After catheter placement, the  tip lies in the right atrium. The catheter aspirates normally and is ready for immediate  use. IMPRESSION: Placement of tunneled hemodialysis catheter via the right internal jugular vein. The catheter tip lies in the right atrium. The catheter is ready for immediate use. Electronically Signed   By: Marcey Moan M.D.   On: 09/22/2024 13:07   IR NON-TUNNELED CENTRAL VENOUS CATH [facility] W IMG Result Date: 09/18/2024 CLINICAL DATA:  Worsening chronic kidney disease now requiring hemodialysis. EXAM: NON-TUNNELED CENTRAL VENOUS HEMODIALYSIS CATHETER PLACEMENT WITH ULTRASOUND AND FLUOROSCOPIC GUIDANCE FLUOROSCOPY: Radiation Exposure Index: 4.3 mGy Kerma PROCEDURE: The procedure, risks, benefits, and alternatives were explained to the patient. Questions regarding the procedure were encouraged and answered. The patient understands and consents to the procedure. Pritika Alvarez time out was performed prior to initiating the procedure. Ultrasound was used to confirm patency of the right internal jugular vein. An ultrasound image was saved and recorded. The right neck and chest were prepped with chlorhexidine  in Humaira Sculley sterile fashion, and Shalita Notte sterile drape was applied covering the operative field. Maximum barrier sterile technique with sterile gowns and gloves were used for the procedure. Local anesthesia was provided with 1% lidocaine . After creating Voula Waln small venotomy incision, Karesa Maultsby 21 gauge needle was advanced into the right internal jugular vein under direct, real-time ultrasound guidance. Ultrasound image documentation was performed. After securing guidewire access,the venotomy was dilated over Jaydrien Wassenaar guide wire. Lonnell Chaput 16 cm, 13 Fr, triple lumen non-tunneled dialysis catheter was advanced over the wire. Final catheter positioning was confirmed and documented with Yannis Broce fluoroscopic spot image. The catheter was aspirated, flushed with saline, and injected with appropriate volume heparin  dwells. The catheter exit site was secured with 0-Prolene retention  sutures. COMPLICATIONS: None.  No pneumothorax. FINDINGS: Initial plan was to place Emidio Warrell tunneled hemodialysis catheter. However, when the patient arrived to the department, he was in significant respiratory distress with high oxygen requirement and required Netra Postlethwait non-rebreather mask in order to maintain adequate oxygenation. Tore Carreker tunneled catheter could not be placed as the patient could not be safely sedated due to his clinical status. After the non tunneled catheter placement, the tip lies at the cavoatrial junction. The catheter aspirates normally and is ready for immediate use. IMPRESSION: Placement of non-tunneled central venous hemodialysis catheter via the right internal jugular vein. The catheter tip lies at the cavoatrial junction. The catheter is ready for immediate use. Electronically Signed   By: Marcey Moan M.D.   On: 09/18/2024 20:32   US  RENAL Result Date: 09/16/2024 EXAM: US  Retroperitoneum Complete, Renal. CLINICAL HISTORY: [id number] Acute kidney injury superimposed on chronic kidney disease [id number]. Acute kidney injury superimposed on chronic kidney disease [id number]. TECHNIQUE: Real-time ultrasound of the retroperitoneum (complete) with image documentation. COMPARISON: CT abdomen and pelvis 10/24/2006. FINDINGS: RIGHT KIDNEY: Measures 8.5 x 5.3 x 5.3 cm, volume 126 ml. There is increased echogenicity. No hydronephrosis, renal stone, or mass visualized. LEFT KIDNEY: Measures 9.3 x 5.9 x 4.7 cm, volume 135 ml. There is increased echogenicity. No hydronephrosis or renal stone visualized. Cannot exclude focal mid left renal mass measuring 3.8 x 3.4 cm. BLADDER: The bladder is not seen. IMPRESSION: 1. Increased echogenicity in both kidneys, consistent with chronic kidney disease. 2. Cannot exclude focal mid left renal mass measuring 3.8 x 3.4 cm. Recommend further evaluation with renal CT or MRI. Electronically signed by: Greig Pique MD 09/16/2024 12:35 AM EDT RP Workstation: [id number]   DG Chest 2  View Result Date: 09/15/2024 EXAM: 2 VIEW(S) XRAY OF THE CHEST 09/15/2024 07:53:05 PM COMPARISON: Chest x-ray 01/07/2023. CLINICAL HISTORY: shob. Recently DC for nec fasc in groin. Pt  was taken off of diuretic. Since stopping, has had increasing SHOB. No CP. Distended abdomen. Weighed 275 upon admission to hospital. Today pt is 302lb. SHOB increases with exertion. Bp 164/90 spo2 94% ra hr 60 cbg 264. FINDINGS: LUNGS AND PLEURA: No focal pulmonary opacity. No pneumothorax. Cardiomegaly with vascular congestion and mild edema, small pleural effusions, and streaky atelectasis at the bases HEART AND MEDIASTINUM: Cardiomegaly with central vascular congestion. BONES AND SOFT TISSUES: No acute osseous abnormality. IMPRESSION: 1. Cardiomegaly with pulmonary vascular congestion and mild interstitial edema. 2. Small bilateral pleural effusions. 3. Bibasilar atelectasis. Electronically signed by: Luke Bun MD 09/15/2024 08:03 PM EDT RP Workstation: [id number]   VAS US  LOWER EXTREMITY VENOUS (DVT) Result Date: 09/03/2024  Lower Venous DVT Study Patient Name:  Cem Kosman.  Date of Exam:   09/03/2024 Medical Rec #: [id number]        Accession #:    [id number] Date of Birth: 11-May-1956        Patient Gender: M Patient Age:   30 years Exam Location:  [facility] Procedure:      VAS US  LOWER EXTREMITY VENOUS (DVT) Referring Phys: Raynell Scott POWELL JR --------------------------------------------------------------------------------  Indications: Swelling.  Risk Factors: Necrotizing soft tissue infection of the perineum and posterior scrotum. Limitations: Skin texture psoriasis). Comparison Study: Prior negative bilateral LEV done 06/23/12 Performing Technologist: Alberta Lis RVS  Examination Guidelines: Jaece Ducharme complete evaluation includes B-mode imaging, spectral Doppler, color Doppler, and power Doppler as needed of all accessible portions of each vessel. Bilateral testing is considered an integral part of Antwaine Boomhower complete  examination. Limited examinations for reoccurring indications may be performed as noted. The reflux portion of the exam is performed with the patient in reverse Trendelenburg.  +---------+---------------+---------+-----------+----------+-------------------+ RIGHT    CompressibilityPhasicitySpontaneityPropertiesThrombus Aging      +---------+---------------+---------+-----------+----------+-------------------+ CFV      Full           Yes      No                                       +---------+---------------+---------+-----------+----------+-------------------+ SFJ      Full                                                             +---------+---------------+---------+-----------+----------+-------------------+ FV Prox  Full                                                             +---------+---------------+---------+-----------+----------+-------------------+ FV Mid   Full                                                             +---------+---------------+---------+-----------+----------+-------------------+ FV DistalFull                                                             +---------+---------------+---------+-----------+----------+-------------------+  PFV      Full                                                             +---------+---------------+---------+-----------+----------+-------------------+ POP      Full           Yes      Yes                                      +---------+---------------+---------+-----------+----------+-------------------+ PTV      Full                                                             +---------+---------------+---------+-----------+----------+-------------------+ PERO                                                  Not well visualized +---------+---------------+---------+-----------+----------+-------------------+ Gastroc  Full                                                              +---------+---------------+---------+-----------+----------+-------------------+   +----+---------------+---------+-----------+----------+--------------+ LEFTCompressibilityPhasicitySpontaneityPropertiesThrombus Aging +----+---------------+---------+-----------+----------+--------------+ CFV Full           Yes      Yes                                 +----+---------------+---------+-----------+----------+--------------+ SFJ Full                                                        +----+---------------+---------+-----------+----------+--------------+     Summary: RIGHT: - There is no evidence of deep vein thrombosis in the lower extremity.  - No cystic structure found in the popliteal fossa.  LEFT: - No evidence of common femoral vein obstruction.   *See table(s) above for measurements and observations. Electronically signed by Debby Robertson on 09/03/2024 at 5:15:22 PM.    Final    DG Abd Portable 1V Result Date: 09/01/2024 CLINICAL DATA:  86105 Emesis 86105 86085 Abdominal distention 86085 [id number] Constipation [id number] Vomiting this morning.  History of gastric band. EXAM: DG ABD PORTABLE 1V COMPARISON:  Radiographs 04/07/2018.  Abdominopelvic CT 07/09/2007. FINDINGS: 0904 hours. Single supine view of the abdomen demonstrates Harriet Bollen laparoscopic gastric band in grossly stable positioning. The tubing is suboptimally visualized on this portable examination. There is moderate distention of the stomach. No significant small or large bowel distention identified. No supine evidence of free intraperitoneal air. Multilevel lumbar spondylosis noted. IMPRESSION: 1. Nonspecific, moderate distention of the stomach.  Correlate clinically to exclude gastric outlet obstruction. 2. No evidence of bowel obstruction or free intraperitoneal air. 3. Grossly stable positioning of the laparoscopic gastric band. Electronically Signed   By: Elsie Perone M.D.   On: 09/01/2024 14:58   CT PELVIS WO  CONTRAST Result Date: 08/28/2024 CLINICAL DATA:  Soft tissue infection suspected, perenial abscess EXAM: CT PELVIS WITHOUT CONTRAST TECHNIQUE: Multidetector CT imaging of the pelvis was performed following the standard protocol without intravenous contrast. RADIATION DOSE REDUCTION: This exam was performed according to the departmental dose-optimization program which includes automated exposure control, adjustment of the mA and/or kV according to patient size and/or use of iterative reconstruction technique. COMPARISON:  None Available. FINDINGS: Urinary Tract:  No abnormality visualized. Bowel:  Unremarkable visualized pelvic bowel loops. Vascular/Lymphatic: No pathologically enlarged lymph nodes. No significant vascular abnormality seen. Small bilateral inguinal lymph nodes, none pathologically enlarged. Reproductive: Scrotal wall edema noted. There is gas within the scrotum and lower perineum concerning for infection with gas-forming organism/necrotizing fasciitis. Other:  No free fluid.  Small umbilical hernia containing fat. Musculoskeletal: No acute bony abnormality. IMPRESSION: Edema/swelling throughout the scrotal wall. Gas noted in the scrotum and lower perineum concerning for necrotizing fasciitis. These results were called by telephone at the time of interpretation on 08/28/2024 at 6:03 pm to provider WHITNEY PLUNKETT , who verbally acknowledged these results. Electronically Signed   By: Franky Crease M.D.   On: 08/28/2024 18:04    Microbiology: No results found for this or any previous visit (from the past 240 hours).   Labs: Basic Metabolic Panel: Recent Labs  Lab 09/16/24 0250 09/17/24 0322 09/18/24 0319 09/19/24 0633 09/20/24 1142 09/21/24 0316 09/22/24 0502  NA 141   < > 141 137 136 136 136  K 3.4*   < > 3.6 3.8 3.7 3.4* 3.6  CL 111   < > 106 101 98 99 98  CO2 18*   < > 20* 23 26 23 27   GLUCOSE 155*   < > 189* 334* 291* 235* 216*  BUN 40*   < > 44* 33* 30* 34* 32*  CREATININE  3.75*   < > 4.09* 3.59* 3.71* 4.15* 3.65*  CALCIUM 8.2*   < > 8.5* 7.8* 8.3* 8.3* 8.3*  MG 1.8  --   --   --   --   --  1.8  PHOS  --   --   --   --   --   --  4.4   < > = values in this interval not displayed.   Liver Function Tests: Recent Labs  Lab 09/15/24 1846 09/22/24 0502  AST 31 13*  ALT 34 20  ALKPHOS 82 70  BILITOT 1.0 0.7  PROT 6.3* 6.2*  ALBUMIN 2.6* 2.3*   No results for input(s): LIPASE, AMYLASE in the last 168 hours. No results for input(s): AMMONIA in the last 168 hours. CBC: Recent Labs  Lab 09/17/24 0322 09/18/24 1541 09/19/24 0633 09/21/24 0316 09/22/24 0502  WBC 8.7 8.9 9.3 11.1* 9.2  NEUTROABS  --   --  6.1  --  5.6  HGB 8.8* 8.5* 8.2* 8.6* 8.3*  HCT 25.7* 25.9* 24.5* 25.7* 24.8*  MCV 82.4 85.2 83.9 83.4 84.4  PLT 284 287 278 258 249   Cardiac Enzymes: No results for input(s): CKTOTAL, CKMB, CKMBINDEX, TROPONINI in the last 168 hours. BNP: BNP (last 3 results) Recent Labs    01/20/24 1042 09/15/24 1846  BNP 295.6* 728.9*    ProBNP (last 3  results) Recent Labs    08/28/24 1613  PROBNP 6,038.0*    CBG: Recent Labs  Lab 09/21/24 1658 09/21/24 1940 09/21/24 2247 09/22/24 0749 09/22/24 1322  GLUCAP 356* 474* 349* 222* 228*       Signed:  Meliton Monte MD.  [facility] 09/22/2024, 4:39 PM

## 2024-09-22 NOTE — Progress Notes (Signed)
 DISCHARGE NOTE HOME Brendan [name]. to be discharged Home per MD order. Discussed prescriptions and follow up appointments with the patient. Prescriptions given to patient; medication list explained in detail. Patient verbalized understanding.  Skin clean, dry and intact without evidence of skin break down, no evidence of skin tears noted. IV catheter discontinued intact. Site without signs and symptoms of complications. Dressing and pressure applied. Pt denies pain at the site currently. No complaints noted.  Wound dressed at discharge, noted that wound present 10/10 when I& D done.  An After Visit Summary (AVS) was printed and given to the patient. Patient escorted via wheelchair, and discharged home via private auto.  [name], RN

## 2024-09-22 NOTE — Procedures (Signed)
 Interventional Radiology Procedure Note  Procedure: Tunneled HD catheter placement  Complications: None  Estimated Blood Loss: < 10 mL  Findings: After temp cath removal, new 23 cm tip to cuff length Palindrome tunneled HD catheter placed via new stick of right internal jugular vein. Tip in RA. OK to use.  Marcey DASEN. Luverne, M.D Pager:  [phone]

## 2024-09-22 NOTE — Progress Notes (Signed)
 PT Cancellation Note  Patient Details Name: Brendan [name]. MRN: [id number] DOB: October 06, 1956   Cancelled Treatment:    Reason Eval/Treat Not Completed: Other (comment);Pain limiting ability to participate  Politely declining; had just gotten back to bed and achy all over;   Will follow up later today as time allows;  Otherwise, will follow up for PT tomorrow;   Thank you,  Silvano Currier, PT  Acute Rehabilitation Services Office [phone]    Silvano VEAR Currier 09/22/2024, 10:51 AM

## 2024-09-22 NOTE — TOC Progression Note (Signed)
 Transition of Care (TOC) - Progression Note    Patient Details  Name: Brendan [name]. MRN: [id number] Date of Birth: Aug 07, 1956  Transition of Care [facility]) CM/SW Contact  Tom-Johnson, Harvest Muskrat, RN Phone Number: 09/22/2024, 2:40 PM  Clinical Narrative:     Patient underwent Tunneled HD Catheter placement today 09/22/24 by IR. Patient received his first Hemodialysis treatment on 09/18/24, Outpatient HD Clipping underway, Renal Navigator following.  Patient is currently active with Home Health disciplines with [facility], resumption of care referral sent through the [facility] with acceptance noted, info on AVS.   Patient not Medically ready for discharge.  CM will continue to follow as patient progresses with care towards discharge.                      Expected Discharge Plan and Services                                               Social Drivers of Health (SDOH) Interventions SDOH Screenings   Food Insecurity: No Food Insecurity (09/19/2024)  Housing: Low Risk  (09/19/2024)  Transportation Needs: No Transportation Needs (09/19/2024)  Utilities: At Risk (09/19/2024)  Financial Resource Strain: Low Risk  (01/08/2023)  Social Connections: Socially Integrated (09/16/2024)  Tobacco Use: Low Risk  (09/18/2024)    Readmission Risk Interventions    09/01/2024   11:34 AM  Readmission Risk Prevention Plan  Transportation Screening Complete  Home Care Screening Complete  Medication Review (RN CM) Referral to Pharmacy

## 2024-09-22 NOTE — Progress Notes (Signed)
 Aveion Nguyen. is an 68 y.o. male HFpEF, type 2 diabetes, hypertension, hyperlipidemia, hypothyroidism, prostate cancer, anemia of chronic disease, OSA on CPAP, CKD 4 followed by Dr. Marlee last seen in early October.  Patient recently admitted with necrotizing fasciitis of the scrotum/perineum status post surgical debridement on 10/10 as well as 10/12 with culture growing multipole organisms. treated with Unasyn as well as a outpatient 2-week course of Augmentin .  Farxiga  was discontinued at time of discharge as his creatinine was slightly elevated compared to baseline which is approximately 3.  Of note finerenone , Entresto  and torsemide  were held at time of discharge.  Unfortunately he has had a 18 pound weight gain since discharge with worsening shortness of breath past couple days before presentation especially with exertion, increased swelling in the lower extremities.  Torsemide  was restarted last Friday (5 days prior to presentation) by Dr. Marlee. In the ED BP was elevated Cr 3.7 BNP 729 and CXR showed pulmonary vasc congestion.   Assessment/Plan: Acute on CKD IV followed by Dr. Marlee. Concerned he may have progressed and is now CKDV with dialysis unfortunately possible in the near future. - Unfortunately very poor response to high dose diuresis; I d/w the pt at length the need to start HD. He is markedly overloaded;  PVR 0 on 10/30. - HD #1 on 10/31 with 3L UF, HD#2 on 11/1 2.5L, #3 on 11/3 3.5L. Net neg 11.8L  On list to be consented by [facility]IR today for conversion  to TC on Tues and in process of CLIP on Mon for AKI (likely ESRD but can give him a mth); d/w Dr. Rolan and low likelihood of coming off dialysis.  Plan on HD tomorrow if he's still here.  CLIP for TCU MTThF start date 09/24/24 chair time 830AM.  - UNNA boots to help mobilize the fluid -> placed AM 10/31. - Renal u/s RK 8.5cm LK 9.3cm with no e/o hydronephrosis   -Monitor Daily I/Os, Daily weight  -Maintain MAP>65 for optimal  renal perfusion.  - Avoid nephrotoxic agents such as IV contrast, NSAIDs, and phosphate containing bowel preps (FLEETS)   HFpEF EF 60-65% - followed by Dr. Cherrie, restarted on Torsemide  40mg  daily last Friday by Dr. Marlee with initially good response; pt states ankle edema initially got better. Hypertension - initially but now much better controlled borderline relative hypotension for him. T2DM on SSI and long acting insulin  being managed by the primary. Anemia -TSAT 21% F40; don't see obvious infx. Will give 2 doses of Venofer and then ESA. Transfuse if Hb <7. Received 1/2 doses. Aranesp 60 qweek OSA on CPAP tolerating  Subjective: Poor response to high dose diuretics, sob much improved since starting dialysis.  PVR 0 on 10/30. Denies f/c/n/v.   Chemistry and CBC: Creatinine, Ser  Date/Time Value Ref Range Status  09/22/2024 05:02 AM 3.65 (H) 0.61 - 1.24 mg/dL Final  88/96/7974 96:83 AM 4.15 (H) 0.61 - 1.24 mg/dL Final  88/97/7974 88:57 AM 3.71 (H) 0.61 - 1.24 mg/dL Final  88/98/7974 93:66 AM 3.59 (H) 0.61 - 1.24 mg/dL Final  89/68/7974 96:80 AM 4.09 (H) 0.61 - 1.24 mg/dL Final  89/69/7974 96:77 AM 3.98 (H) 0.61 - 1.24 mg/dL Final  89/70/7974 97:49 AM 3.75 (H) 0.61 - 1.24 mg/dL Final  89/71/7974 92:86 PM 3.90 (H) 0.61 - 1.24 mg/dL Final  89/71/7974 93:53 PM 3.70 (H) 0.61 - 1.24 mg/dL Final  89/83/7974 95:40 AM 3.63 (H) 0.61 - 1.24 mg/dL Final  89/84/7974 98:51 AM 3.55 (H) 0.61 -  1.24 mg/dL Final  89/85/7974 96:57 AM 3.81 (H) 0.61 - 1.24 mg/dL Final  89/86/7974 95:73 AM 3.52 (H) 0.61 - 1.24 mg/dL Final  89/88/7974 96:73 AM 3.55 (H) 0.61 - 1.24 mg/dL Final  89/89/7974 95:86 PM 3.79 (H) 0.61 - 1.24 mg/dL Final  94/91/7974 89:72 AM 3.08 (H) 0.61 - 1.24 mg/dL Final  95/77/7974 87:86 PM 2.88 (H) 0.61 - 1.24 mg/dL Final  96/96/7974 89:57 AM 3.09 (H) 0.61 - 1.24 mg/dL Final  91/77/7975 88:56 AM 2.68 (H) 0.61 - 1.24 mg/dL Final  93/96/7975 88:81 AM 2.74 (H) 0.61 - 1.24 mg/dL Final   94/77/7975 97:45 PM 2.83 (H) 0.61 - 1.24 mg/dL Final  94/89/7975 96:61 PM 2.82 (H) 0.61 - 1.24 mg/dL Final  94/96/7975 90:86 AM 2.75 (H) 0.61 - 1.24 mg/dL Final  95/80/7975 87:49 PM 3.08 (H) 0.61 - 1.24 mg/dL Final  95/90/7975 97:46 PM 2.84 (H) 0.61 - 1.24 mg/dL Final  96/70/7975 88:62 AM 2.88 (H) 0.61 - 1.24 mg/dL Final  96/88/7975 91:41 AM 2.91 (H) 0.61 - 1.24 mg/dL Final  97/71/7975 98:46 PM 3.45 (H) 0.61 - 1.24 mg/dL Final  97/76/7975 97:59 PM 2.93 (H) 0.61 - 1.24 mg/dL Final  97/76/7975 93:87 AM 2.78 (H) 0.61 - 1.24 mg/dL Final  97/77/7975 95:51 PM 2.63 (H) 0.61 - 1.24 mg/dL Final  97/77/7975 92:94 AM 2.47 (H) 0.61 - 1.24 mg/dL Final  97/78/7975 96:72 PM 2.32 (H) 0.61 - 1.24 mg/dL Final  97/78/7975 93:49 AM 2.36 (H) 0.61 - 1.24 mg/dL Final  97/78/7975 98:67 AM 2.36 (H) 0.61 - 1.24 mg/dL Final  97/79/7975 95:99 PM 2.23 (H) 0.61 - 1.24 mg/dL Final  97/79/7975 98:70 AM 2.43 (H) 0.61 - 1.24 mg/dL Final  97/80/7975 92:81 PM 2.43 (H) 0.61 - 1.24 mg/dL Final  89/83/7976 91:50 AM 2.17 (H) 0.76 - 1.27 mg/dL Final  90/78/7976 96:75 PM 2.14 (H) 0.76 - 1.27 mg/dL Final  93/96/7976 90:64 AM 2.90 (H) 0.61 - 1.24 mg/dL Final  96/91/7976 88:92 AM 2.46 (H) 0.76 - 1.27 mg/dL Final  89/70/7978 90:60 AM 2.26 (H) 0.61 - 1.24 mg/dL Final  89/92/7978 88:55 AM 2.57 (H) 0.76 - 1.27 mg/dL Final  91/87/7978 89:79 AM 2.68 (H) 0.61 - 1.24 mg/dL Final  91/94/7978 90:82 AM 2.71 (H) 0.61 - 1.24 mg/dL Final  92/70/7978 87:77 PM 2.32 (H) 0.61 - 1.24 mg/dL Final  94/74/7978 89:46 AM 2.41 (H) 0.61 - 1.24 mg/dL Final  94/81/7978 95:94 PM 2.41 (H) 0.61 - 1.24 mg/dL Final  94/90/7978 94:57 AM 2.19 (H) 0.61 - 1.24 mg/dL Final  94/91/7978 90:97 PM 2.27 (H) 0.61 - 1.24 mg/dL Final  94/91/7978 95:54 PM 2.11 (H) 0.61 - 1.24 mg/dL Final   Recent Labs  Lab 09/16/24 0250 09/17/24 0322 09/18/24 0319 09/19/24 0633 09/20/24 1142 09/21/24 0316 09/22/24 0502  NA 141 141 141 137 136 136 136  K 3.4* 3.6 3.6 3.8 3.7 3.4*  3.6  CL 111 108 106 101 98 99 98  CO2 18* 19* 20* 23 26 23 27   GLUCOSE 155* 283* 189* 334* 291* 235* 216*  BUN 40* 43* 44* 33* 30* 34* 32*  CREATININE 3.75* 3.98* 4.09* 3.59* 3.71* 4.15* 3.65*  CALCIUM 8.2* 8.2* 8.5* 7.8* 8.3* 8.3* 8.3*  PHOS  --   --   --   --   --   --  4.4   Recent Labs  Lab 09/18/24 1541 09/19/24 0633 09/21/24 0316 09/22/24 0502  WBC 8.9 9.3 11.1* 9.2  NEUTROABS  --  6.1  --  5.6  HGB 8.5* 8.2* 8.6* 8.3*  HCT 25.9* 24.5* 25.7* 24.8*  MCV 85.2 83.9 83.4 84.4  PLT 287 278 258 249   Liver Function Tests: Recent Labs  Lab 09/15/24 1846 09/22/24 0502  AST 31 13*  ALT 34 20  ALKPHOS 82 70  BILITOT 1.0 0.7  PROT 6.3* 6.2*  ALBUMIN 2.6* 2.3*   No results for input(s): LIPASE, AMYLASE in the last 168 hours. No results for input(s): AMMONIA in the last 168 hours. Cardiac Enzymes: No results for input(s): CKTOTAL, CKMB, CKMBINDEX, TROPONINI in the last 168 hours. Iron Studies:  No results for input(s): IRON, TIBC, TRANSFERRIN, FERRITIN in the last 72 hours.  PT/INR: @LABRCNTIP (inr:5)  Xrays/Other Studies: ) Results for orders placed or performed during the hospital encounter of 09/15/24 (from the past 48 hours)  Basic metabolic panel with GFR     Status: Abnormal   Collection Time: 09/20/24 11:42 AM  Result Value Ref Range   Sodium 136 135 - 145 mmol/L   Potassium 3.7 3.5 - 5.1 mmol/L   Chloride 98 98 - 111 mmol/L   CO2 26 22 - 32 mmol/L   Glucose, Bld 291 (H) 70 - 99 mg/dL    Comment: Glucose reference range applies only to samples taken after fasting for at least 8 hours.   BUN 30 (H) 8 - 23 mg/dL   Creatinine, Ser 6.28 (H) 0.61 - 1.24 mg/dL   Calcium 8.3 (L) 8.9 - 10.3 mg/dL   GFR, Estimated 17 (L) >60 mL/min    Comment: (NOTE) Calculated using the CKD-EPI Creatinine Equation (2021)    Anion gap 12 5 - 15    Comment: Performed at [facility], [address], [place], [place] [address]  Glucose, capillary      Status: Abnormal   Collection Time: 09/20/24 12:15 PM  Result Value Ref Range   Glucose-Capillary 291 (H) 70 - 99 mg/dL    Comment: Glucose reference range applies only to samples taken after fasting for at least 8 hours.  Glucose, capillary     Status: Abnormal   Collection Time: 09/20/24  4:54 PM  Result Value Ref Range   Glucose-Capillary 296 (H) 70 - 99 mg/dL    Comment: Glucose reference range applies only to samples taken after fasting for at least 8 hours.  Glucose, capillary     Status: Abnormal   Collection Time: 09/20/24  8:39 PM  Result Value Ref Range   Glucose-Capillary 272 (H) 70 - 99 mg/dL    Comment: Glucose reference range applies only to samples taken after fasting for at least 8 hours.   Comment 1 Document in Chart   Basic metabolic panel with GFR     Status: Abnormal   Collection Time: 09/21/24  3:16 AM  Result Value Ref Range   Sodium 136 135 - 145 mmol/L   Potassium 3.4 (L) 3.5 - 5.1 mmol/L   Chloride 99 98 - 111 mmol/L   CO2 23 22 - 32 mmol/L   Glucose, Bld 235 (H) 70 - 99 mg/dL    Comment: Glucose reference range applies only to samples taken after fasting for at least 8 hours.   BUN 34 (H) 8 - 23 mg/dL   Creatinine, Ser 5.84 (H) 0.61 - 1.24 mg/dL   Calcium 8.3 (L) 8.9 - 10.3 mg/dL   GFR, Estimated 15 (L) >60 mL/min    Comment: (NOTE) Calculated using the CKD-EPI Creatinine Equation (2021)    Anion gap 14 5 - 15  Comment: Performed at [facility], [address], [place], [place] [address]  CBC     Status: Abnormal   Collection Time: 09/21/24  3:16 AM  Result Value Ref Range   WBC 11.1 (H) 4.0 - 10.5 K/uL   RBC 3.08 (L) 4.22 - 5.81 MIL/uL   Hemoglobin 8.6 (L) 13.0 - 17.0 g/dL   HCT 74.2 (L) 60.9 - 47.9 %   MCV 83.4 80.0 - 100.0 fL   MCH 27.9 26.0 - 34.0 pg   MCHC 33.5 30.0 - 36.0 g/dL   RDW 84.0 (H) 88.4 - 84.4 %   Platelets 258 150 - 400 K/uL   nRBC 0.0 0.0 - 0.2 %    Comment: Performed at [facility], [address],  [place], [place] [address]  Glucose, capillary     Status: Abnormal   Collection Time: 09/21/24  1:59 PM  Result Value Ref Range   Glucose-Capillary 249 (H) 70 - 99 mg/dL    Comment: Glucose reference range applies only to samples taken after fasting for at least 8 hours.  Glucose, capillary     Status: Abnormal   Collection Time: 09/21/24  4:58 PM  Result Value Ref Range   Glucose-Capillary 356 (H) 70 - 99 mg/dL    Comment: Glucose reference range applies only to samples taken after fasting for at least 8 hours.  Glucose, capillary     Status: Abnormal   Collection Time: 09/21/24  7:40 PM  Result Value Ref Range   Glucose-Capillary 474 (H) 70 - 99 mg/dL    Comment: Glucose reference range applies only to samples taken after fasting for at least 8 hours.  Glucose, capillary     Status: Abnormal   Collection Time: 09/21/24 10:47 PM  Result Value Ref Range   Glucose-Capillary 349 (H) 70 - 99 mg/dL    Comment: Glucose reference range applies only to samples taken after fasting for at least 8 hours.  CBC with Differential/Platelet     Status: Abnormal   Collection Time: 09/22/24  5:02 AM  Result Value Ref Range   WBC 9.2 4.0 - 10.5 K/uL   RBC 2.94 (L) 4.22 - 5.81 MIL/uL   Hemoglobin 8.3 (L) 13.0 - 17.0 g/dL   HCT 75.1 (L) 60.9 - 47.9 %   MCV 84.4 80.0 - 100.0 fL   MCH 28.2 26.0 - 34.0 pg   MCHC 33.5 30.0 - 36.0 g/dL   RDW 84.1 (H) 88.4 - 84.4 %   Platelets 249 150 - 400 K/uL   nRBC 0.0 0.0 - 0.2 %   Neutrophils Relative % 59 %   Neutro Abs 5.6 1.7 - 7.7 K/uL   Lymphocytes Relative 15 %   Lymphs Abs 1.4 0.7 - 4.0 K/uL   Monocytes Relative 14 %   Monocytes Absolute 1.3 (H) 0.1 - 1.0 K/uL   Eosinophils Relative 10 %   Eosinophils Absolute 0.9 (H) 0.0 - 0.5 K/uL   Basophils Relative 1 %   Basophils Absolute 0.1 0.0 - 0.1 K/uL   Immature Granulocytes 1 %   Abs Immature Granulocytes 0.06 0.00 - 0.07 K/uL    Comment: Performed at [facility], [address], [place], [place]  [address]  Comprehensive metabolic panel with GFR     Status: Abnormal   Collection Time: 09/22/24  5:02 AM  Result Value Ref Range   Sodium 136 135 - 145 mmol/L   Potassium 3.6 3.5 - 5.1 mmol/L   Chloride  98 98 - 111 mmol/L   CO2 27 22 - 32 mmol/L   Glucose, Bld 216 (H) 70 - 99 mg/dL    Comment: Glucose reference range applies only to samples taken after fasting for at least 8 hours.   BUN 32 (H) 8 - 23 mg/dL   Creatinine, Ser 6.34 (H) 0.61 - 1.24 mg/dL   Calcium 8.3 (L) 8.9 - 10.3 mg/dL   Total Protein 6.2 (L) 6.5 - 8.1 g/dL   Albumin 2.3 (L) 3.5 - 5.0 g/dL   AST 13 (L) 15 - 41 U/L   ALT 20 0 - 44 U/L   Alkaline Phosphatase 70 38 - 126 U/L   Total Bilirubin 0.7 0.0 - 1.2 mg/dL   GFR, Estimated 17 (L) >60 mL/min    Comment: (NOTE) Calculated using the CKD-EPI Creatinine Equation (2021)    Anion gap 11 5 - 15    Comment: Performed at [facility], [address] [place], [place] [address]  Magnesium      Status: None   Collection Time: 09/22/24  5:02 AM  Result Value Ref Range   Magnesium  1.8 1.7 - 2.4 mg/dL    Comment: Performed at [facility], [address] [place], [place] [address]  Phosphorus     Status: None   Collection Time: 09/22/24  5:02 AM  Result Value Ref Range   Phosphorus 4.4 2.5 - 4.6 mg/dL    Comment: Performed at [facility], [address], [place], [place] [address]   No results found.    PMH:   Past Medical History:  Diagnosis Date   Acanthosis nigricans    Atopic dermatitis    CHF (congestive heart failure) (HCC)    CKD (chronic kidney disease) stage 4, GFR 15-29 ml/min (HCC) 01/08/2023   Diabetes (HCC) 11/19/2002   Erectile dysfunction    Fatty liver 11/19/2005   Glaucoma 11/19/2006   Gynecomastia 11/20/2007   Hx of adenomatous colonic polyps 08/26/2023   Hyperaldosteronism 11/19/1998   Hypercholesterolemia    Hyperlipidemia 2010   Hypertension 1999   Hypoglycemic reaction    Hypothyroidism 11/19/2002   Incontinence     Intermittent vertigo 11/19/2010   Left cervical radiculopathy 11/19/2010   Lumbar radiculopathy    Obesity    Peripheral neuropathy    Pollen allergies 11/19/2005   perennial   Prostate cancer (HCC) 11/19/2004   Reflux esophagitis 11/19/1993   Sickle cell trait 11/19/2004   Sleep apnea, obstructive 11/19/1998   uses a cpap   Venous insufficiency 11/20/2003   Vitamin D deficiency 11/19/2010    PSH:   Past Surgical History:  Procedure Laterality Date   COLONOSCOPY  2006   normal   INCISION AND DRAINAGE OF WOUND N/A 08/28/2024   Procedure: IRRIGATION AND EXCISIONAL DEBRIDEMENT WOUND;  Surgeon: Lyndel Deward PARAS, MD;  Location: [facility] OR;  Service: General;  Laterality: N/A;  EXCISIONAL DEBRIDEMENT   INCISION AND DRAINAGE PERIRECTAL ABSCESS N/A 08/30/2024   Procedure: INCISION AND DRAINAGE OF PERINEAL WOUND;  Surgeon: Polly Cordella LABOR, MD;  Location: [facility] OR;  Service: General;  Laterality: N/A;   IR TUNNELED CENTRAL VENOUS CATH PLC W IMG  09/18/2024   lap band surgery  2009   left inguinal hernia repair  1998   LIPOMA EXCISION Left 04/17/2023   Procedure: MINOR EXCISION LEFT BUTTOCK SEBACEOUS CYST;  Surgeon: Lyndel Deward PARAS, MD;  Location: [facility];  Service: General;  Laterality: Left;   robotic prostatectomy  2008  Allergies:  Allergies  Allergen Reactions   Ace Inhibitors Cough   Maxidex [Dexamethasone] Other (See Comments)    Sexual dysfunction   Verapamil Other (See Comments)    constipation    Medications:   Prior to Admission medications   Medication Sig Start Date End Date Taking? Authorizing Provider  acetaminophen  (TYLENOL ) 500 MG tablet Take 500 mg by mouth every 6 (six) hours as needed for mild pain.   Yes [provider]  amLODipine  (NORVASC ) 5 MG tablet Take 1 tablet (5 mg total) by mouth daily. 09/04/24 10/04/24 Yes Perri DELENA Meliton Mickey., MD  carvedilol  (COREG ) 25 MG tablet Take 1 tablet (25 mg total) by mouth 2 (two)  times daily with food 04/01/24  Yes   Continuous Glucose Sensor (FREESTYLE LIBRE 3 PLUS SENSOR) MISC Apply to upper back of arm. Change every 15 days. 04/03/24  Yes   Continuous Glucose Sensor (FREESTYLE LIBRE 3 SENSOR) MISC Apply to upper back of arm and change ever 14 days. 01/28/24  Yes Faythe Purchase, MD  dorzolamide (TRUSOPT) 2 % ophthalmic solution Place 1 drop into both eyes daily.   Yes [provider]  hydrALAZINE  (APRESOLINE ) 100 MG tablet Take 1/2 tablet (50 mg total) by mouth 2 (two) times daily. 08/30/23  Yes Clegg, Amy D, NP  insulin  glargine (LANTUS  SOLOSTAR) 100 UNIT/ML Solostar Pen Inject 15 Units into the skin daily. Follow up with your PCP outpatient for further adjustment to your regimen 09/03/24  Yes Perri DELENA Meliton Mickey., MD  insulin  lispro (HUMALOG  KWIKPEN) 200 UNIT/ML KwikPen Inject 0-15 Units into the skin 3 (three) times daily with meals. CBG < 70: treat low blood sugar CBG 70 - 120: 0 units  CBG 121 - 150: 2 units  CBG 151 - 200: 3 units  CBG 201 - 250: 5 units  CBG 251 - 300: 8 units  CBG 301 - 350: 11 units  CBG 351 - 400: 15 units  CBG >400: Call MD 09/03/24  Yes Perri DELENA Meliton Mickey., MD  levothyroxine  (SYNTHROID ) 125 MCG tablet Take 1 tablet (125 mcg total) by mouth every morning on an empty stomach 06/08/24  Yes   simvastatin  (ZOCOR ) 20 MG tablet Take 1 tablet (20 mg total) by mouth every evening. 07/09/24  Yes   timolol  (BETIMOL ) 0.5 % ophthalmic solution Place 1 drop into both eyes daily.   Yes [provider]  tirzepatide  (MOUNJARO ) 15 MG/0.5ML Pen Inject 15 mg into the skin once a week. 04/14/24  Yes   torsemide  (DEMADEX ) 20 MG tablet Take 3 tablets (60 mg total) by mouth daily. Patient taking differently: Take 40 mg by mouth daily. 08/08/23 09/15/25 Yes Milford, Harlene HERO, FNP  clobetasol  ointment (TEMOVATE ) 0.05 % Apply topically daily before sleeping. Patient not taking: Reported on 08/29/2024 05/19/24     Finerenone  (KERENDIA ) 20 MG TABS Take  20 mg by mouth daily. Patient not taking: Reported on 09/15/2024    [provider]  Insulin  Pen Needle (TECHLITE PEN NEEDLES) 32G X 4 MM MISC Use to inject insulin  4 times daily 10/25/22   Faythe Purchase, MD  Insulin  Pen Needle 32G X 4 MM MISC Use to inject insulin  up to 4 times daily. 12/26/23   Faythe Purchase, MD  potassium chloride  SA (KLOR-CON  M) 20 MEQ tablet Take 3 tablets (60 mEq total) by mouth daily. Patient not taking: Reported on 09/15/2024 03/10/24   Glena Harlene HERO, FNP  sacubitril -valsartan  (ENTRESTO ) 49-51 MG Take 1 tablet by mouth 2 (two) times  daily. Patient not taking: Reported on 09/15/2024 08/25/24   Bensimhon, Toribio SAUNDERS, MD    Discontinued Meds:   Medications Discontinued During This Encounter  Medication Reason   albuterol  (VENTOLIN  HFA) 108 (90 Base) MCG/ACT inhaler 2 puff    insulin  glargine-yfgn (SEMGLEE ) injection 6 Units    timolol  (BETIMOL ) 0.5 % ophthalmic solution 1 drop    potassium chloride  (KLOR-CON  M) CR tablet 10 mEq    carvedilol  (COREG ) tablet 25 mg    dorzolamide (TRUSOPT) 2 % ophthalmic solution 1 drop    furosemide  (LASIX ) injection 80 mg    furosemide  (LASIX ) 120 mg in dextrose  5 % 50 mL IVPB    pentafluoroprop-tetrafluoroeth (GEBAUERS) aerosol 1 Application Patient Transfer   lidocaine  (PF) (XYLOCAINE ) 1 % injection 5 mL Patient Transfer   lidocaine -prilocaine (EMLA) cream 1 Application Patient Transfer   heparin  injection 1,000 Units Patient Transfer   anticoagulant sodium citrate solution 5 mL Patient Transfer   alteplase (CATHFLO ACTIVASE) injection 2 mg Patient Transfer   furosemide  (LASIX ) 120 mg in dextrose  5 % 50 mL IVPB    insulin  glargine-yfgn (SEMGLEE ) injection 6 Units    insulin  glargine-yfgn (SEMGLEE ) injection 10 Units    iron sucrose (VENOFER) 200 mg in sodium chloride  0.9 % 100 mL IVPB Entry Error   iron sucrose (VENOFER) 200 mg in sodium chloride  0.9 % 100 mL IVPB Inpatient Standard    Social History:  reports that he  has never smoked. He has never used smokeless tobacco. He reports current alcohol use. He reports that he does not use drugs.  Family History:   Family History  Problem Relation Age of Onset   Heart failure Mother    Hypertension Father        deceased age 10   Heart attack Father    COPD Sister    CVA Sister    Diabetes Daughter    Colon cancer Neg Hx    Stomach cancer Neg Hx    Colon polyps Neg Hx    Esophageal cancer Neg Hx    Rectal cancer Neg Hx     Blood pressure 129/67, pulse 70, temperature 98.1 F (36.7 C), resp. rate 18, height 6' 2 (1.88 m), weight 127.7 kg, SpO2 98%. Physical Exam: GEN: NAD, A&Ox3, NCAT, obese HEENT: No conjunctival pallor, EOMI NECK: Supple, no thyromegaly LUNGS: CTA B/L no rales, rhonchi or wheezing CV: RRR, No M/R/G ABD: SNDNT +BS  EXT: tr-1+ lower extremity edema to thighs GU: male purewick ACCESS: RIJ temp    MELIA LYNWOOD ORN, MD 09/22/2024, 7:17 AM

## 2024-09-22 NOTE — Evaluation (Signed)
 Occupational Therapy Evaluation Patient Details Name: Brendan [name]. MRN: [id number] DOB: 1955-11-27 Today's Date: 09/22/2024   History of Present Illness   68 y.o. male HFpEF, type 2 diabetes, hypertension, hyperlipidemia, hypothyroidism, prostate cancer, anemia of chronic disease, OSA on CPAP, CKD 4 followed by Dr. Marlee last seen in early October.  Patient recently admitted with necrotizing fasciitis of the scrotum/perineum status post surgical debridement on 10/10 as well as 10/12.  Adm 11/3 for fluid overload.     Clinical Impressions Patient admitted for the diagnosis above.  PTA he lives at home, and spouse can assist with supportive needs, not physical assist.  Patient was undergoing HH OT, and will recommend continuation.  OT will follow in the acute setting.       If plan is discharge home, recommend the following:   A little help with walking and/or transfers;A little help with bathing/dressing/bathroom;Assistance with cooking/housework;Assist for transportation;Help with stairs or ramp for entrance     Functional Status Assessment   Patient has had a recent decline in their functional status and demonstrates the ability to make significant improvements in function in a reasonable and predictable amount of time.     Equipment Recommendations   None recommended by OT     Recommendations for Other Services         Precautions/Restrictions   Precautions Precautions: Fall Recall of Precautions/Restrictions: Intact Restrictions Weight Bearing Restrictions Per Provider Order: No     Mobility Bed Mobility Overal bed mobility: Modified Independent             General bed mobility comments: Increased time and use of bed functions    Transfers Overall transfer level: Needs assistance Equipment used: Rolling walker (2 wheels) Transfers: Sit to/from Stand, Bed to chair/wheelchair/BSC Sit to Stand: Contact guard assist     Step pivot transfers:  Supervision            Balance Overall balance assessment: Needs assistance Sitting-balance support: No upper extremity supported, Feet supported Sitting balance-Leahy Scale: Good     Standing balance support: Single extremity supported, During functional activity Standing balance-Leahy Scale: Fair                             ADL either performed or assessed with clinical judgement   ADL   Eating/Feeding: Independent   Grooming: Supervision/safety;Standing   Upper Body Bathing: Supervision/ safety   Lower Body Bathing: Minimal assistance;Sitting/lateral leans;Sit to/from stand   Upper Body Dressing : Set up;Supervision/safety   Lower Body Dressing: Sitting/lateral leans;Sit to/from stand;Minimal assistance   Toilet Transfer: Supervision/safety;Rolling walker (2 wheels)   Toileting- Clothing Manipulation and Hygiene: Sitting/lateral lean;Supervision/safety               Vision Patient Visual Report: No change from baseline       Perception Perception: Not tested       Praxis Praxis: Not tested       Pertinent Vitals/Pain Pain Assessment Pain Assessment: Faces Faces Pain Scale: Hurts little more Pain Location: R knee Pain Descriptors / Indicators: Aching Pain Intervention(s): Monitored during session     Extremity/Trunk Assessment Upper Extremity Assessment Upper Extremity Assessment: Overall WFL for tasks assessed   Lower Extremity Assessment Lower Extremity Assessment: Defer to PT evaluation   Cervical / Trunk Assessment Cervical / Trunk Assessment: Normal   Communication Communication Communication: No apparent difficulties   Cognition Arousal: Alert Behavior During Therapy: WFL for tasks assessed/performed Cognition: No  apparent impairments                               Following commands: Intact       Cueing  General Comments   Cueing Techniques: Verbal cues      Exercises     Shoulder Instructions       Home Living Family/patient expects to be discharged to:: Private residence Living Arrangements: Spouse/significant other Available Help at Discharge: Family;Available 24 hours/day Type of Home: House Home Access: Stairs to enter [employer] of Steps: 4 Entrance Stairs-Rails: Right;Left;Can reach both Home Layout: One level     Bathroom Shower/Tub: Producer, Television/film/video: Standard Bathroom Accessibility: Yes How Accessible: Accessible via walker Home Equipment: Cane - single point;Hand held Programmer, Systems (2 wheels);BSC/3in1   Additional Comments: Pt lives with wife who works during the day, Pt reports he has friends who can stop by to assist at times      Prior Functioning/Environment Prior Level of Function : Independent/Modified Independent;Driving             Mobility Comments: Uses SPC and holds onto furniture to mobilize; x2 falls in past 6 months when not using the cane 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    OT Problem List: Decreased strength;Decreased range of motion;Decreased activity tolerance;Impaired balance (sitting and/or standing);Obesity;Increased edema   OT Treatment/Interventions:        OT Goals(Current goals can be found in the care plan section)   Acute Rehab OT Goals Patient Stated Goal: Return home OT Goal Formulation: With patient Time For Goal Achievement: 10/06/24 Potential to Achieve Goals: Good ADL Goals Pt Will Perform Grooming: with modified independence;standing Pt Will Perform Lower Body Dressing: with modified independence;sit to/from stand Pt Will Transfer to Toilet: with modified independence;ambulating;regular height toilet   OT Frequency:  Min 2X/week    Co-evaluation              AM-PAC OT 6 Clicks Daily Activity     Outcome Measure Help from another person eating meals?: None Help from another person taking care of personal grooming?: A Little Help from another person  toileting, which includes using toliet, bedpan, or urinal?: A Little Help from another person bathing (including washing, rinsing, drying)?: A Little Help from another person to put on and taking off regular upper body clothing?: None Help from another person to put on and taking off regular lower body clothing?: A Little 6 Click Score: 20   End of Session Equipment Utilized During Treatment: Gait belt;Rolling walker (2 wheels) Nurse Communication: Mobility status  Activity Tolerance: Patient tolerated treatment well Patient left: in bed;with call bell/phone within reach  OT Visit Diagnosis: Unsteadiness on feet (R26.81);Other abnormalities of gait and mobility (R26.89);Muscle weakness (generalized) (M62.81);Other (comment)                Time: 9169-9140 OT Time Calculation (min): 29 min Charges:  OT General Charges $OT Visit: 1 Visit OT Evaluation $OT Eval Moderate Complexity: 1 Mod OT Treatments $Self Care/Home Management : 8-22 mins  09/22/2024  RP, OTR/L  Acute Rehabilitation Services  Office:  [phone]   Charlie JONETTA Halsted 09/22/2024, 9:06 AM

## 2024-09-22 NOTE — Consult Note (Signed)
 Chief Complaint: CKD IV requiring hemodialysis - IR consulted for tunneled dialysis catheter  Referring Provider(s): Melia Lynwood ORN, MD   Supervising Physician: Philip Cornet  Patient Status: [facility] - In-pt  History of Present Illness: Brendan [name]. is a 68 y.o. male with hx of HFpEF, type 2 diabetes, hypertension, hyperlipidemia, hypothyroidism, prostate cancer, anemia of chronic disease, OSA on CPAP, CKD 4. Pt recently admitted for necrotizing fasciitis of scrotum/perineum and s/p debridement 10/10 and 10/12. Has been on Unasyn and Augmentin . Since last admission pt has gained 18 lbs with lower extremity swelling. Nephrology following and started aggressive diuresis with torsemide , but unfortunately poor response to this. A right side temp HD cath was placed 10/31 with first dialysis treatment this day. Due to need for future long term dialysis, nephrology now requesting IR convert temp HD cath to tunneled HD cath.   Today pt with no additional complaints. Has been NPO.    Patient is Full Code  Past Medical History:  Diagnosis Date   Acanthosis nigricans    Atopic dermatitis    CHF (congestive heart failure) (HCC)    CKD (chronic kidney disease) stage 4, GFR 15-29 ml/min (HCC) 01/08/2023   Diabetes (HCC) 11/19/2002   Erectile dysfunction    Fatty liver 11/19/2005   Glaucoma 11/19/2006   Gynecomastia 11/20/2007   Hx of adenomatous colonic polyps 08/26/2023   Hyperaldosteronism 11/19/1998   Hypercholesterolemia    Hyperlipidemia 2010   Hypertension 1999   Hypoglycemic reaction    Hypothyroidism 11/19/2002   Incontinence    Intermittent vertigo 11/19/2010   Left cervical radiculopathy 11/19/2010   Lumbar radiculopathy    Obesity    Peripheral neuropathy    Pollen allergies 11/19/2005   perennial   Prostate cancer (HCC) 11/19/2004   Reflux esophagitis 11/19/1993   Sickle cell trait 11/19/2004   Sleep apnea, obstructive 11/19/1998   uses a cpap   Venous insufficiency  11/20/2003   Vitamin D deficiency 11/19/2010    Past Surgical History:  Procedure Laterality Date   COLONOSCOPY  2006   normal   INCISION AND DRAINAGE OF WOUND N/A 08/28/2024   Procedure: IRRIGATION AND EXCISIONAL DEBRIDEMENT WOUND;  Surgeon: Lyndel Deward PARAS, MD;  Location: [facility] OR;  Service: General;  Laterality: N/A;  EXCISIONAL DEBRIDEMENT   INCISION AND DRAINAGE PERIRECTAL ABSCESS N/A 08/30/2024   Procedure: INCISION AND DRAINAGE OF PERINEAL WOUND;  Surgeon: Polly Cordella LABOR, MD;  Location: [facility] OR;  Service: General;  Laterality: N/A;   IR TUNNELED CENTRAL VENOUS CATH PLC W IMG  09/18/2024   lap band surgery  2009   left inguinal hernia repair  1998   LIPOMA EXCISION Left 04/17/2023   Procedure: MINOR EXCISION LEFT BUTTOCK SEBACEOUS CYST;  Surgeon: Lyndel Deward PARAS, MD;  Location: [facility];  Service: General;  Laterality: Left;   robotic prostatectomy  2008    Allergies: Ace inhibitors, Maxidex [dexamethasone], and Verapamil  Medications: Prior to Admission medications   Medication Sig Start Date End Date Taking? Authorizing Provider  acetaminophen  (TYLENOL ) 500 MG tablet Take 500 mg by mouth every 6 (six) hours as needed for mild pain.   Yes [provider]  amLODipine  (NORVASC ) 5 MG tablet Take 1 tablet (5 mg total) by mouth daily. 09/04/24 10/04/24 Yes Perri LABOR Meliton Raddle., MD  carvedilol  (COREG ) 25 MG tablet Take 1 tablet (25 mg total) by mouth 2 (two) times daily with food 04/01/24  Yes   Continuous Glucose Sensor (FREESTYLE LIBRE 3 PLUS  SENSOR) MISC Apply to upper back of arm. Change every 15 days. 04/03/24  Yes   Continuous Glucose Sensor (FREESTYLE LIBRE 3 SENSOR) MISC Apply to upper back of arm and change ever 14 days. 01/28/24  Yes Faythe Purchase, MD  dorzolamide (TRUSOPT) 2 % ophthalmic solution Place 1 drop into both eyes daily.   Yes [provider]  hydrALAZINE  (APRESOLINE ) 100 MG tablet Take 1/2 tablet (50 mg total) by mouth 2  (two) times daily. 08/30/23  Yes Clegg, Amy D, NP  insulin  glargine (LANTUS  SOLOSTAR) 100 UNIT/ML Solostar Pen Inject 15 Units into the skin daily. Follow up with your PCP outpatient for further adjustment to your regimen 09/03/24  Yes Perri DELENA Meliton Mickey., MD  insulin  lispro (HUMALOG  KWIKPEN) 200 UNIT/ML KwikPen Inject 0-15 Units into the skin 3 (three) times daily with meals. CBG < 70: treat low blood sugar CBG 70 - 120: 0 units  CBG 121 - 150: 2 units  CBG 151 - 200: 3 units  CBG 201 - 250: 5 units  CBG 251 - 300: 8 units  CBG 301 - 350: 11 units  CBG 351 - 400: 15 units  CBG >400: Call MD 09/03/24  Yes Perri DELENA Meliton Mickey., MD  levothyroxine  (SYNTHROID ) 125 MCG tablet Take 1 tablet (125 mcg total) by mouth every morning on an empty stomach 06/08/24  Yes   simvastatin  (ZOCOR ) 20 MG tablet Take 1 tablet (20 mg total) by mouth every evening. 07/09/24  Yes   timolol  (BETIMOL ) 0.5 % ophthalmic solution Place 1 drop into both eyes daily.   Yes [provider]  tirzepatide  (MOUNJARO ) 15 MG/0.5ML Pen Inject 15 mg into the skin once a week. 04/14/24  Yes   torsemide  (DEMADEX ) 20 MG tablet Take 3 tablets (60 mg total) by mouth daily. Patient taking differently: Take 40 mg by mouth daily. 08/08/23 09/15/25 Yes Milford, Harlene HERO, FNP  clobetasol  ointment (TEMOVATE ) 0.05 % Apply topically daily before sleeping. Patient not taking: Reported on 08/29/2024 05/19/24     Finerenone  (KERENDIA ) 20 MG TABS Take 20 mg by mouth daily. Patient not taking: Reported on 09/15/2024    [provider]  Insulin  Pen Needle (TECHLITE PEN NEEDLES) 32G X 4 MM MISC Use to inject insulin  4 times daily 10/25/22   Faythe Purchase, MD  Insulin  Pen Needle 32G X 4 MM MISC Use to inject insulin  up to 4 times daily. 12/26/23   Faythe Purchase, MD  potassium chloride  SA (KLOR-CON  M) 20 MEQ tablet Take 3 tablets (60 mEq total) by mouth daily. Patient not taking: Reported on 09/15/2024 03/10/24   Glena Harlene HERO, FNP   sacubitril -valsartan  (ENTRESTO ) 49-51 MG Take 1 tablet by mouth 2 (two) times daily. Patient not taking: Reported on 09/15/2024 08/25/24   Bensimhon, Toribio SAUNDERS, MD     Family History  Problem Relation Age of Onset   Heart failure Mother    Hypertension Father        deceased age 77   Heart attack Father    COPD Sister    CVA Sister    Diabetes Daughter    Colon cancer Neg Hx    Stomach cancer Neg Hx    Colon polyps Neg Hx    Esophageal cancer Neg Hx    Rectal cancer Neg Hx     Social History   Socioeconomic History   Marital status: Married    Spouse name: Not on file   Number of children: 3   Years of education:  Not on file   Highest education level: Not on file  Occupational History   Occupation: truck driver    Comment: Retired futures trader  Tobacco Use   Smoking status: Never   Smokeless tobacco: Never  Vaping Use   Vaping status: Never Used  Substance and Sexual Activity   Alcohol use: Yes    Comment: one drink every few months - 1965   Drug use: No   Sexual activity: Yes  Other Topics Concern   Not on file  Social History Narrative   Right handed   Lives in a two story home    Drinks no caffeine    Social Drivers of Corporate Investment Banker Strain: Low Risk  (01/08/2023)   Overall Financial Resource Strain (CARDIA)    Difficulty of Paying Living Expenses: Not hard at all  Food Insecurity: No Food Insecurity (09/19/2024)   Hunger Vital Sign    Worried About Running Out of Food in the Last Year: Never true    Ran Out of Food in the Last Year: Never true  Transportation Needs: No Transportation Needs (09/19/2024)   PRAPARE - Administrator, Civil Service (Medical): No    Lack of Transportation (Non-Medical): No  Physical Activity: Not on file  Stress: Not on file  Social Connections: Socially Integrated (09/16/2024)   Social Connection and Isolation Panel    Frequency of Communication with Friends and Family: More than three times a  week    Frequency of Social Gatherings with Friends and Family: More than three times a week    Attends Religious Services: More than 4 times per year    Active Member of [employer] or Organizations: Yes    Attends Engineer, Structural: More than 4 times per year    Marital Status: Married     Review of Systems: A 12 point ROS discussed and pertinent positives are indicated in the HPI above.  All other systems are negative.   Vital Signs: BP 135/71 (BP Location: Right Arm)   Pulse 67   Temp 98.5 F (36.9 C) (Oral)   Resp 18   Ht 6' 2 (1.88 m)   Wt 281 lb 8.4 oz (127.7 kg)   SpO2 100%   BMI 36.15 kg/m   Advance Care Plan: No documents on file  Physical Exam Vitals and nursing note reviewed.  Constitutional:      General: He is not in acute distress. HENT:     Mouth/Throat:     Mouth: Mucous membranes are moist.     Pharynx: Oropharynx is clear.  Cardiovascular:     Rate and Rhythm: Normal rate and regular rhythm.  Pulmonary:     Effort: Pulmonary effort is normal.     Breath sounds: Normal breath sounds.  Abdominal:     Palpations: Abdomen is soft.     Tenderness: There is no abdominal tenderness.  Musculoskeletal:     Right lower leg: No edema.     Left lower leg: No edema.  Skin:    General: Skin is warm and dry.  Neurological:     Mental Status: He is alert and oriented to person, place, and time. Mental status is at baseline.     Imaging: IR NON-TUNNELED CENTRAL VENOUS CATH [facility] W IMG Result Date: 09/18/2024 CLINICAL DATA:  Worsening chronic kidney disease now requiring hemodialysis. EXAM: NON-TUNNELED CENTRAL VENOUS HEMODIALYSIS CATHETER PLACEMENT WITH ULTRASOUND AND FLUOROSCOPIC GUIDANCE FLUOROSCOPY: Radiation Exposure Index: 4.3 mGy Kerma PROCEDURE: The  procedure, risks, benefits, and alternatives were explained to the patient. Questions regarding the procedure were encouraged and answered. The patient understands and consents to the procedure. A time  out was performed prior to initiating the procedure. Ultrasound was used to confirm patency of the right internal jugular vein. An ultrasound image was saved and recorded. The right neck and chest were prepped with chlorhexidine  in a sterile fashion, and a sterile drape was applied covering the operative field. Maximum barrier sterile technique with sterile gowns and gloves were used for the procedure. Local anesthesia was provided with 1% lidocaine . After creating a small venotomy incision, a 21 gauge needle was advanced into the right internal jugular vein under direct, real-time ultrasound guidance. Ultrasound image documentation was performed. After securing guidewire access,the venotomy was dilated over a guide wire. A 16 cm, 13 Fr, triple lumen non-tunneled dialysis catheter was advanced over the wire. Final catheter positioning was confirmed and documented with a fluoroscopic spot image. The catheter was aspirated, flushed with saline, and injected with appropriate volume heparin  dwells. The catheter exit site was secured with 0-Prolene retention sutures. COMPLICATIONS: None.  No pneumothorax. FINDINGS: Initial plan was to place a tunneled hemodialysis catheter. However, when the patient arrived to the department, he was in significant respiratory distress with high oxygen requirement and required a non-rebreather mask in order to maintain adequate oxygenation. A tunneled catheter could not be placed as the patient could not be safely sedated due to his clinical status. After the non tunneled catheter placement, the tip lies at the cavoatrial junction. The catheter aspirates normally and is ready for immediate use. IMPRESSION: Placement of non-tunneled central venous hemodialysis catheter via the right internal jugular vein. The catheter tip lies at the cavoatrial junction. The catheter is ready for immediate use. Electronically Signed   By: Marcey Moan M.D.   On: 09/18/2024 20:32   US  RENAL Result  Date: 09/16/2024 EXAM: US  Retroperitoneum Complete, Renal. CLINICAL HISTORY: [id number] Acute kidney injury superimposed on chronic kidney disease 8540519. Acute kidney injury superimposed on chronic kidney disease 8540519. TECHNIQUE: Real-time ultrasound of the retroperitoneum (complete) with image documentation. COMPARISON: CT abdomen and pelvis 10/24/2006. FINDINGS: RIGHT KIDNEY: Measures 8.5 x 5.3 x 5.3 cm, volume 126 ml. There is increased echogenicity. No hydronephrosis, renal stone, or mass visualized. LEFT KIDNEY: Measures 9.3 x 5.9 x 4.7 cm, volume 135 ml. There is increased echogenicity. No hydronephrosis or renal stone visualized. Cannot exclude focal mid left renal mass measuring 3.8 x 3.4 cm. BLADDER: The bladder is not seen. IMPRESSION: 1. Increased echogenicity in both kidneys, consistent with chronic kidney disease. 2. Cannot exclude focal mid left renal mass measuring 3.8 x 3.4 cm. Recommend further evaluation with renal CT or MRI. Electronically signed by: Greig Pique MD 09/16/2024 12:35 AM EDT RP Workstation: [id number]   DG Chest 2 View Result Date: 09/15/2024 EXAM: 2 VIEW(S) XRAY OF THE CHEST 09/15/2024 07:53:05 PM COMPARISON: Chest x-ray 01/07/2023. CLINICAL HISTORY: shob. Recently DC for nec fasc in groin. Pt was taken off of diuretic. Since stopping, has had increasing SHOB. No CP. Distended abdomen. Weighed 275 upon admission to hospital. Today pt is 302lb. SHOB increases with exertion. Bp 164/90 spo2 94% ra hr 60 cbg 264. FINDINGS: LUNGS AND PLEURA: No focal pulmonary opacity. No pneumothorax. Cardiomegaly with vascular congestion and mild edema, small pleural effusions, and streaky atelectasis at the bases HEART AND MEDIASTINUM: Cardiomegaly with central vascular congestion. BONES AND SOFT TISSUES: No acute osseous abnormality. IMPRESSION: 1. Cardiomegaly with pulmonary vascular  congestion and mild interstitial edema. 2. Small bilateral pleural effusions. 3. Bibasilar atelectasis.  Electronically signed by: Luke Bun MD 09/15/2024 08:03 PM EDT RP Workstation: [id number]   VAS US  LOWER EXTREMITY VENOUS (DVT) Result Date: 09/03/2024  Lower Venous DVT Study Patient Name:  Brendan [name].  Date of Exam:   09/03/2024 Medical Rec #: [id number]        Accession #:    [id number] Date of Birth: Apr 19, 1956        Patient Gender: M Patient Age:   24 years Exam Location:  [facility] Procedure:      VAS US  LOWER EXTREMITY VENOUS (DVT) Referring Phys: A POWELL JR --------------------------------------------------------------------------------  Indications: Swelling.  Risk Factors: Necrotizing soft tissue infection of the perineum and posterior scrotum. Limitations: Skin texture psoriasis). Comparison Study: Prior negative bilateral LEV done 06/23/12 Performing Technologist: Alberta Lis RVS  Examination Guidelines: A complete evaluation includes B-mode imaging, spectral Doppler, color Doppler, and power Doppler as needed of all accessible portions of each vessel. Bilateral testing is considered an integral part of a complete examination. Limited examinations for reoccurring indications may be performed as noted. The reflux portion of the exam is performed with the patient in reverse Trendelenburg.  +---------+---------------+---------+-----------+----------+-------------------+ RIGHT    CompressibilityPhasicitySpontaneityPropertiesThrombus Aging      +---------+---------------+---------+-----------+----------+-------------------+ CFV      Full           Yes      No                                       +---------+---------------+---------+-----------+----------+-------------------+ SFJ      Full                                                             +---------+---------------+---------+-----------+----------+-------------------+ FV Prox  Full                                                              +---------+---------------+---------+-----------+----------+-------------------+ FV Mid   Full                                                             +---------+---------------+---------+-----------+----------+-------------------+ FV DistalFull                                                             +---------+---------------+---------+-----------+----------+-------------------+ PFV      Full                                                             +---------+---------------+---------+-----------+----------+-------------------+  POP      Full           Yes      Yes                                      +---------+---------------+---------+-----------+----------+-------------------+ PTV      Full                                                             +---------+---------------+---------+-----------+----------+-------------------+ PERO                                                  Not well visualized +---------+---------------+---------+-----------+----------+-------------------+ Gastroc  Full                                                             +---------+---------------+---------+-----------+----------+-------------------+   +----+---------------+---------+-----------+----------+--------------+ LEFTCompressibilityPhasicitySpontaneityPropertiesThrombus Aging +----+---------------+---------+-----------+----------+--------------+ CFV Full           Yes      Yes                                 +----+---------------+---------+-----------+----------+--------------+ SFJ Full                                                        +----+---------------+---------+-----------+----------+--------------+     Summary: RIGHT: - There is no evidence of deep vein thrombosis in the lower extremity.  - No cystic structure found in the popliteal fossa.  LEFT: - No evidence of common femoral vein obstruction.   *See table(s) above for  measurements and observations. Electronically signed by Debby Robertson on 09/03/2024 at 5:15:22 PM.    Final    DG Abd Portable 1V Result Date: 09/01/2024 CLINICAL DATA:  86105 Emesis 86105 86085 Abdominal distention 86085 [id number] Constipation [id number] Vomiting this morning.  History of gastric band. EXAM: DG ABD PORTABLE 1V COMPARISON:  Radiographs 04/07/2018.  Abdominopelvic CT 07/09/2007. FINDINGS: 0904 hours. Single supine view of the abdomen demonstrates a laparoscopic gastric band in grossly stable positioning. The tubing is suboptimally visualized on this portable examination. There is moderate distention of the stomach. No significant small or large bowel distention identified. No supine evidence of free intraperitoneal air. Multilevel lumbar spondylosis noted. IMPRESSION: 1. Nonspecific, moderate distention of the stomach. Correlate clinically to exclude gastric outlet obstruction. 2. No evidence of bowel obstruction or free intraperitoneal air. 3. Grossly stable positioning of the laparoscopic gastric band. Electronically Signed   By: Elsie Perone M.D.   On: 09/01/2024 14:58   CT PELVIS WO CONTRAST Result Date: 08/28/2024 CLINICAL DATA:  Soft tissue infection suspected, perenial abscess EXAM: CT PELVIS WITHOUT CONTRAST TECHNIQUE: Multidetector CT imaging of  the pelvis was performed following the standard protocol without intravenous contrast. RADIATION DOSE REDUCTION: This exam was performed according to the departmental dose-optimization program which includes automated exposure control, adjustment of the mA and/or kV according to patient size and/or use of iterative reconstruction technique. COMPARISON:  None Available. FINDINGS: Urinary Tract:  No abnormality visualized. Bowel:  Unremarkable visualized pelvic bowel loops. Vascular/Lymphatic: No pathologically enlarged lymph nodes. No significant vascular abnormality seen. Small bilateral inguinal lymph nodes, none pathologically enlarged.  Reproductive: Scrotal wall edema noted. There is gas within the scrotum and lower perineum concerning for infection with gas-forming organism/necrotizing fasciitis. Other:  No free fluid.  Small umbilical hernia containing fat. Musculoskeletal: No acute bony abnormality. IMPRESSION: Edema/swelling throughout the scrotal wall. Gas noted in the scrotum and lower perineum concerning for necrotizing fasciitis. These results were called by telephone at the time of interpretation on 08/28/2024 at 6:03 pm to provider WHITNEY PLUNKETT , who verbally acknowledged these results. Electronically Signed   By: Franky Crease M.D.   On: 08/28/2024 18:04    Labs:  CBC: Recent Labs    09/18/24 1541 09/19/24 0633 09/21/24 0316 09/22/24 0502  WBC 8.9 9.3 11.1* 9.2  HGB 8.5* 8.2* 8.6* 8.3*  HCT 25.9* 24.5* 25.7* 24.8*  PLT 287 278 258 249    COAGS: Recent Labs    08/28/24 1613  INR 1.0    BMP: Recent Labs    09/19/24 0633 09/20/24 1142 09/21/24 0316 09/22/24 0502  NA 137 136 136 136  K 3.8 3.7 3.4* 3.6  CL 101 98 99 98  CO2 23 26 23 27   GLUCOSE 334* 291* 235* 216*  BUN 33* 30* 34* 32*  CALCIUM 7.8* 8.3* 8.3* 8.3*  CREATININE 3.59* 3.71* 4.15* 3.65*  GFRNONAA 18* 17* 15* 17*    LIVER FUNCTION TESTS: Recent Labs    09/02/24 0148 09/03/24 0459 09/15/24 1846 09/22/24 0502  BILITOT 0.8 0.8 1.0 0.7  AST 18 16 31  13*  ALT 18 18 34 20  ALKPHOS 67 62 82 70  PROT 5.7* 5.8* 6.3* 6.2*  ALBUMIN 2.2* 2.3* 2.6* 2.3*    TUMOR MARKERS: No results for input(s): AFPTM, CEA, CA199, CHROMGRNA in the last 8760 hours.  Assessment and Plan:  Brendan [name]. is a 68 y.o. male with hx of HFpEF, type 2 diabetes, hypertension, hyperlipidemia, hypothyroidism, prostate cancer, anemia of chronic disease, OSA on CPAP, CKD 4. Pt recently admitted for necrotizing fasciitis of scrotum/perineum and s/p debridement 10/10 and 10/12. Has been on Unasyn and Augmentin . Since last admission pt has gained 18  lbs with lower extremity swelling. Nephrology following and started aggressive diuresis with torsemide , but unfortunately poor response to this. A right side temp HD cath was placed 10/31 with first dialysis treatment this day. Due to need for future long term dialysis, nephrology now requesting IR convert temp HD cath to tunneled HD cath.   Today pt with no additional complaints. Has been NPO.   Risks and benefits discussed with the patient including, but not limited to bleeding, infection, vascular injury, pneumothorax which may require chest tube placement, air embolism or even death. All of the patient's questions were answered, patient is agreeable to proceed. Consent signed and in chart.   Thank you for allowing our service to participate in Brendan [name]. 's care.  Electronically Signed: Kimble VEAR Clas, PA-C   09/22/2024, 9:18 AM      I spent a total of 20 Minutes    in face to face  in clinical consultation, greater than 50% of which was counseling/coordinating care for tunneled hemodialysis catheter

## 2024-09-22 NOTE — Plan of Care (Signed)
  Problem: Food- and Nutrition-Related Knowledge Deficit (NB-1.1) Goal: Nutrition education Description: Formal process to instruct or train a patient/client in a skill or to impart knowledge to help patients/clients voluntarily manage or modify food choices and eating behavior to maintain or improve health. Outcome: Completed/Met Note: Nutrition Education Note  RD consulted for Renal Education. Provided Nutrition for Dialysis by [employer].   Explained why diet restrictions are needed and provided lists of foods to limit/avoid that are high potassium, sodium, and phosphorus. Provided specific recommendations on safer alternatives of these foods. Strongly encouraged compliance of this diet.   Discussed importance of protein intake at each meal and snack. Provided examples of how to maximize protein intake throughout the day. Discussed need for fluid restriction with dialysis, importance of minimizing weight gain between HD treatments, and renal-friendly beverage options.  Encouraged pt to discuss specific diet questions/concerns with RD at HD outpatient facility. Teach back method used.  Expect good compliance.  Body mass index is 36.15 kg/m.   Current diet order is renal/carb-modified with 1200 mL fluid restriction, patient is consuming approximately 80-100% of meals at this time. Labs and medications reviewed. No further nutrition interventions warranted at this time. RD contact information provided. If additional nutrition issues arise, please re-consult RD.  Leverne Ruth, MS, RDN, LDN [place]. [facility] See AMION for contact information

## 2024-09-23 DIAGNOSIS — S3130XA Unspecified open wound of scrotum and testes, initial encounter: Secondary | ICD-10-CM | POA: Diagnosis not present

## 2024-09-23 DIAGNOSIS — I5033 Acute on chronic diastolic (congestive) heart failure: Secondary | ICD-10-CM | POA: Diagnosis not present

## 2024-09-23 DIAGNOSIS — T8131XA Disruption of external operation (surgical) wound, not elsewhere classified, initial encounter: Secondary | ICD-10-CM | POA: Diagnosis not present

## 2024-09-23 NOTE — Discharge Planning (Signed)
  [facility] Initial Dialysis Discharge Orders   Patient Name: Brendan [name].  Admission Date: 09/15/2024 Discharge Date: 09/22/2024 Dialysis Unit: [facility] - TCU - MTuThF schedule  Admitting Diagnosis: Acute on Chronic CKD -> AKI for now, but likely ESRD. Due to T2DM + cardiorenal syndrome, 1st HD 09/18/24 Dyspnea/fluid overload  Other PMHx T2DM HTN HFpEF - ARB, Farxiga , Kerendia  on hold. HL hypothyroidism Hx prostate cancer OSA (on Cpap) Hx necrotizing fasciitis of scrotum/perineum - s/p course of abx, S/p I&D 10/12 L renal mass -> needs outpatient f/u obesity  Discharge Labs: Basic Metabolic Panel: Recent Labs  Lab 09/20/24 1142 09/21/24 0316 09/22/24 0502  NA 136 136 136  K 3.7 3.4* 3.6  CL 98 99 98  CO2 26 23 27   GLUCOSE 291* 235* 216*  BUN 30* 34* 32*  CREATININE 3.71* 4.15* 3.65*  CALCIUM 8.3* 8.3* 8.3*  PHOS  --   --  4.4   CBC: Recent Labs  Lab 09/17/24 0322 09/18/24 1541 09/19/24 0633 09/21/24 0316 09/22/24 0502  WBC 8.7 8.9 9.3 11.1* 9.2  NEUTROABS  --   --  6.1  --  5.6  HGB 8.8* 8.5* 8.2* 8.6* 8.3*  HCT 25.7* 25.9* 24.5* 25.7* 24.8*  MCV 82.4 85.2 83.9 83.4 84.4  PLT 284 287 278 258 249   Dialysis Orders: 4d/week 180 dialyzer 3hr BFR 400 DFR 1.5 EDW 125kg --> keep challenging, overloaded 3K/2.5Ca bath TDC - placed 11/4 no UF or Na profile  Dialysis Meds: Heparin  2000 units bolus Mircera 50mcg IV q 2 weeks Venofer 100mg  x 5, tsat 21% on 10/30 Hectoral/calcitriol per protocol  Other/Appts/Lab orders: - Check weekly AKI labs per protocol (K, BUN, Cr, Ca, Phos, Alb, Hgb) - Will need repeat kidney imaging in the future for possible kidney mass, ?CT. Wait until declared ESRD.  Lamarr JONELLE Boehringer, PA-C 09/23/2024, 10:48 AM  [employer] [phone]

## 2024-09-23 NOTE — Progress Notes (Signed)
 Late note entry 11/6, 0935  D/c noted, contacted pt out-pt HD clinic, [facility], to inform of d/c and anticipated arrival tomorrow. Have contacted renal pa to send orders. No further support needed at this time.   Cy[facility] [phone]

## 2024-09-24 DIAGNOSIS — I5033 Acute on chronic diastolic (congestive) heart failure: Secondary | ICD-10-CM | POA: Diagnosis not present

## 2024-09-24 DIAGNOSIS — D631 Anemia in chronic kidney disease: Secondary | ICD-10-CM | POA: Diagnosis not present

## 2024-09-24 DIAGNOSIS — I129 Hypertensive chronic kidney disease with stage 1 through stage 4 chronic kidney disease, or unspecified chronic kidney disease: Secondary | ICD-10-CM | POA: Diagnosis not present

## 2024-09-24 DIAGNOSIS — Z992 Dependence on renal dialysis: Secondary | ICD-10-CM | POA: Diagnosis not present

## 2024-09-24 DIAGNOSIS — N184 Chronic kidney disease, stage 4 (severe): Secondary | ICD-10-CM | POA: Diagnosis not present

## 2024-09-24 DIAGNOSIS — N179 Acute kidney failure, unspecified: Secondary | ICD-10-CM | POA: Diagnosis not present

## 2024-09-24 DIAGNOSIS — E1122 Type 2 diabetes mellitus with diabetic chronic kidney disease: Secondary | ICD-10-CM | POA: Diagnosis not present

## 2024-09-24 DIAGNOSIS — M726 Necrotizing fasciitis: Secondary | ICD-10-CM | POA: Diagnosis not present

## 2024-09-25 DIAGNOSIS — Z794 Long term (current) use of insulin: Secondary | ICD-10-CM | POA: Diagnosis not present

## 2024-09-25 DIAGNOSIS — Z7985 Long-term (current) use of injectable non-insulin antidiabetic drugs: Secondary | ICD-10-CM | POA: Diagnosis not present

## 2024-09-25 DIAGNOSIS — I5042 Chronic combined systolic (congestive) and diastolic (congestive) heart failure: Secondary | ICD-10-CM | POA: Diagnosis not present

## 2024-09-25 DIAGNOSIS — I872 Venous insufficiency (chronic) (peripheral): Secondary | ICD-10-CM | POA: Diagnosis not present

## 2024-09-25 DIAGNOSIS — Z6836 Body mass index (BMI) 36.0-36.9, adult: Secondary | ICD-10-CM | POA: Diagnosis not present

## 2024-09-25 DIAGNOSIS — Z556 Problems related to health literacy: Secondary | ICD-10-CM | POA: Diagnosis not present

## 2024-09-25 DIAGNOSIS — I130 Hypertensive heart and chronic kidney disease with heart failure and stage 1 through stage 4 chronic kidney disease, or unspecified chronic kidney disease: Secondary | ICD-10-CM | POA: Diagnosis not present

## 2024-09-25 DIAGNOSIS — Z992 Dependence on renal dialysis: Secondary | ICD-10-CM | POA: Diagnosis not present

## 2024-09-25 DIAGNOSIS — E1122 Type 2 diabetes mellitus with diabetic chronic kidney disease: Secondary | ICD-10-CM | POA: Diagnosis not present

## 2024-09-25 DIAGNOSIS — D631 Anemia in chronic kidney disease: Secondary | ICD-10-CM | POA: Diagnosis not present

## 2024-09-25 DIAGNOSIS — N179 Acute kidney failure, unspecified: Secondary | ICD-10-CM | POA: Diagnosis not present

## 2024-09-25 DIAGNOSIS — K760 Fatty (change of) liver, not elsewhere classified: Secondary | ICD-10-CM | POA: Diagnosis not present

## 2024-09-25 DIAGNOSIS — Z8546 Personal history of malignant neoplasm of prostate: Secondary | ICD-10-CM | POA: Diagnosis not present

## 2024-09-25 DIAGNOSIS — E1142 Type 2 diabetes mellitus with diabetic polyneuropathy: Secondary | ICD-10-CM | POA: Diagnosis not present

## 2024-09-25 DIAGNOSIS — M109 Gout, unspecified: Secondary | ICD-10-CM | POA: Diagnosis not present

## 2024-09-25 DIAGNOSIS — H409 Unspecified glaucoma: Secondary | ICD-10-CM | POA: Diagnosis not present

## 2024-09-25 DIAGNOSIS — N184 Chronic kidney disease, stage 4 (severe): Secondary | ICD-10-CM | POA: Diagnosis not present

## 2024-09-25 DIAGNOSIS — E039 Hypothyroidism, unspecified: Secondary | ICD-10-CM | POA: Diagnosis not present

## 2024-09-25 DIAGNOSIS — G4733 Obstructive sleep apnea (adult) (pediatric): Secondary | ICD-10-CM | POA: Diagnosis not present

## 2024-09-25 DIAGNOSIS — Z48817 Encounter for surgical aftercare following surgery on the skin and subcutaneous tissue: Secondary | ICD-10-CM | POA: Diagnosis not present

## 2024-09-28 DIAGNOSIS — I130 Hypertensive heart and chronic kidney disease with heart failure and stage 1 through stage 4 chronic kidney disease, or unspecified chronic kidney disease: Secondary | ICD-10-CM | POA: Diagnosis not present

## 2024-09-28 DIAGNOSIS — E1142 Type 2 diabetes mellitus with diabetic polyneuropathy: Secondary | ICD-10-CM | POA: Diagnosis not present

## 2024-09-28 DIAGNOSIS — H409 Unspecified glaucoma: Secondary | ICD-10-CM | POA: Diagnosis not present

## 2024-09-28 DIAGNOSIS — L400 Psoriasis vulgaris: Secondary | ICD-10-CM | POA: Diagnosis not present

## 2024-09-28 DIAGNOSIS — E039 Hypothyroidism, unspecified: Secondary | ICD-10-CM | POA: Diagnosis not present

## 2024-09-28 DIAGNOSIS — I5042 Chronic combined systolic (congestive) and diastolic (congestive) heart failure: Secondary | ICD-10-CM | POA: Diagnosis not present

## 2024-09-28 DIAGNOSIS — G4733 Obstructive sleep apnea (adult) (pediatric): Secondary | ICD-10-CM | POA: Diagnosis not present

## 2024-09-28 DIAGNOSIS — N184 Chronic kidney disease, stage 4 (severe): Secondary | ICD-10-CM | POA: Diagnosis not present

## 2024-09-28 DIAGNOSIS — E1122 Type 2 diabetes mellitus with diabetic chronic kidney disease: Secondary | ICD-10-CM | POA: Diagnosis not present

## 2024-09-28 DIAGNOSIS — Z7985 Long-term (current) use of injectable non-insulin antidiabetic drugs: Secondary | ICD-10-CM | POA: Diagnosis not present

## 2024-09-28 DIAGNOSIS — D631 Anemia in chronic kidney disease: Secondary | ICD-10-CM | POA: Diagnosis not present

## 2024-09-28 DIAGNOSIS — M109 Gout, unspecified: Secondary | ICD-10-CM | POA: Diagnosis not present

## 2024-09-28 DIAGNOSIS — Z556 Problems related to health literacy: Secondary | ICD-10-CM | POA: Diagnosis not present

## 2024-09-28 DIAGNOSIS — Z48817 Encounter for surgical aftercare following surgery on the skin and subcutaneous tissue: Secondary | ICD-10-CM | POA: Diagnosis not present

## 2024-09-28 DIAGNOSIS — Z794 Long term (current) use of insulin: Secondary | ICD-10-CM | POA: Diagnosis not present

## 2024-09-28 DIAGNOSIS — I872 Venous insufficiency (chronic) (peripheral): Secondary | ICD-10-CM | POA: Diagnosis not present

## 2024-09-28 DIAGNOSIS — Z8546 Personal history of malignant neoplasm of prostate: Secondary | ICD-10-CM | POA: Diagnosis not present

## 2024-09-28 DIAGNOSIS — N179 Acute kidney failure, unspecified: Secondary | ICD-10-CM | POA: Diagnosis not present

## 2024-09-28 DIAGNOSIS — Z992 Dependence on renal dialysis: Secondary | ICD-10-CM | POA: Diagnosis not present

## 2024-09-28 DIAGNOSIS — Z6836 Body mass index (BMI) 36.0-36.9, adult: Secondary | ICD-10-CM | POA: Diagnosis not present

## 2024-09-28 DIAGNOSIS — K760 Fatty (change of) liver, not elsewhere classified: Secondary | ICD-10-CM | POA: Diagnosis not present

## 2024-09-29 ENCOUNTER — Other Ambulatory Visit (HOSPITAL_COMMUNITY): Payer: Self-pay

## 2024-09-29 DIAGNOSIS — Z48817 Encounter for surgical aftercare following surgery on the skin and subcutaneous tissue: Secondary | ICD-10-CM | POA: Diagnosis not present

## 2024-09-29 DIAGNOSIS — N184 Chronic kidney disease, stage 4 (severe): Secondary | ICD-10-CM | POA: Diagnosis not present

## 2024-09-29 DIAGNOSIS — E1122 Type 2 diabetes mellitus with diabetic chronic kidney disease: Secondary | ICD-10-CM | POA: Diagnosis not present

## 2024-09-29 DIAGNOSIS — E1142 Type 2 diabetes mellitus with diabetic polyneuropathy: Secondary | ICD-10-CM | POA: Diagnosis not present

## 2024-09-29 DIAGNOSIS — H409 Unspecified glaucoma: Secondary | ICD-10-CM | POA: Diagnosis not present

## 2024-09-29 DIAGNOSIS — N179 Acute kidney failure, unspecified: Secondary | ICD-10-CM | POA: Diagnosis not present

## 2024-09-29 DIAGNOSIS — Z8546 Personal history of malignant neoplasm of prostate: Secondary | ICD-10-CM | POA: Diagnosis not present

## 2024-09-29 DIAGNOSIS — I130 Hypertensive heart and chronic kidney disease with heart failure and stage 1 through stage 4 chronic kidney disease, or unspecified chronic kidney disease: Secondary | ICD-10-CM | POA: Diagnosis not present

## 2024-09-29 DIAGNOSIS — E039 Hypothyroidism, unspecified: Secondary | ICD-10-CM | POA: Diagnosis not present

## 2024-09-29 DIAGNOSIS — Z794 Long term (current) use of insulin: Secondary | ICD-10-CM | POA: Diagnosis not present

## 2024-09-29 DIAGNOSIS — M109 Gout, unspecified: Secondary | ICD-10-CM | POA: Diagnosis not present

## 2024-09-29 DIAGNOSIS — I872 Venous insufficiency (chronic) (peripheral): Secondary | ICD-10-CM | POA: Diagnosis not present

## 2024-09-29 DIAGNOSIS — I5042 Chronic combined systolic (congestive) and diastolic (congestive) heart failure: Secondary | ICD-10-CM | POA: Diagnosis not present

## 2024-09-29 DIAGNOSIS — Z992 Dependence on renal dialysis: Secondary | ICD-10-CM | POA: Diagnosis not present

## 2024-09-29 DIAGNOSIS — Z6836 Body mass index (BMI) 36.0-36.9, adult: Secondary | ICD-10-CM | POA: Diagnosis not present

## 2024-09-29 DIAGNOSIS — K760 Fatty (change of) liver, not elsewhere classified: Secondary | ICD-10-CM | POA: Diagnosis not present

## 2024-09-29 DIAGNOSIS — Z7985 Long-term (current) use of injectable non-insulin antidiabetic drugs: Secondary | ICD-10-CM | POA: Diagnosis not present

## 2024-09-29 DIAGNOSIS — Z556 Problems related to health literacy: Secondary | ICD-10-CM | POA: Diagnosis not present

## 2024-09-29 DIAGNOSIS — D631 Anemia in chronic kidney disease: Secondary | ICD-10-CM | POA: Diagnosis not present

## 2024-09-29 DIAGNOSIS — G4733 Obstructive sleep apnea (adult) (pediatric): Secondary | ICD-10-CM | POA: Diagnosis not present

## 2024-09-30 ENCOUNTER — Other Ambulatory Visit (HOSPITAL_COMMUNITY): Payer: Self-pay

## 2024-09-30 MED ORDER — HUMALOG KWIKPEN 200 UNIT/ML ~~LOC~~ SOPN
0.0000 [IU] | PEN_INJECTOR | Freq: Three times a day (TID) | SUBCUTANEOUS | 3 refills | Status: DC
  Filled 2024-09-30: qty 12, 28d supply, fill #0
  Filled 2024-10-09: qty 45, 90d supply, fill #0
  Filled 2024-10-09: qty 3, 5d supply, fill #0
  Filled 2024-10-09: qty 42, 85d supply, fill #0

## 2024-10-01 DIAGNOSIS — N179 Acute kidney failure, unspecified: Secondary | ICD-10-CM | POA: Diagnosis not present

## 2024-10-01 DIAGNOSIS — D631 Anemia in chronic kidney disease: Secondary | ICD-10-CM | POA: Diagnosis not present

## 2024-10-01 DIAGNOSIS — E1122 Type 2 diabetes mellitus with diabetic chronic kidney disease: Secondary | ICD-10-CM | POA: Diagnosis not present

## 2024-10-01 DIAGNOSIS — D649 Anemia, unspecified: Secondary | ICD-10-CM | POA: Diagnosis not present

## 2024-10-01 DIAGNOSIS — N184 Chronic kidney disease, stage 4 (severe): Secondary | ICD-10-CM | POA: Diagnosis not present

## 2024-10-01 DIAGNOSIS — I129 Hypertensive chronic kidney disease with stage 1 through stage 4 chronic kidney disease, or unspecified chronic kidney disease: Secondary | ICD-10-CM | POA: Diagnosis not present

## 2024-10-01 DIAGNOSIS — Z992 Dependence on renal dialysis: Secondary | ICD-10-CM | POA: Diagnosis not present

## 2024-10-02 DIAGNOSIS — Z992 Dependence on renal dialysis: Secondary | ICD-10-CM | POA: Diagnosis not present

## 2024-10-02 DIAGNOSIS — D631 Anemia in chronic kidney disease: Secondary | ICD-10-CM | POA: Diagnosis not present

## 2024-10-02 DIAGNOSIS — N184 Chronic kidney disease, stage 4 (severe): Secondary | ICD-10-CM | POA: Diagnosis not present

## 2024-10-02 DIAGNOSIS — D649 Anemia, unspecified: Secondary | ICD-10-CM | POA: Diagnosis not present

## 2024-10-02 DIAGNOSIS — E1122 Type 2 diabetes mellitus with diabetic chronic kidney disease: Secondary | ICD-10-CM | POA: Diagnosis not present

## 2024-10-02 DIAGNOSIS — I129 Hypertensive chronic kidney disease with stage 1 through stage 4 chronic kidney disease, or unspecified chronic kidney disease: Secondary | ICD-10-CM | POA: Diagnosis not present

## 2024-10-02 DIAGNOSIS — N179 Acute kidney failure, unspecified: Secondary | ICD-10-CM | POA: Diagnosis not present

## 2024-10-04 DIAGNOSIS — Z556 Problems related to health literacy: Secondary | ICD-10-CM | POA: Diagnosis not present

## 2024-10-04 DIAGNOSIS — E039 Hypothyroidism, unspecified: Secondary | ICD-10-CM | POA: Diagnosis not present

## 2024-10-04 DIAGNOSIS — E1122 Type 2 diabetes mellitus with diabetic chronic kidney disease: Secondary | ICD-10-CM | POA: Diagnosis not present

## 2024-10-04 DIAGNOSIS — G4733 Obstructive sleep apnea (adult) (pediatric): Secondary | ICD-10-CM | POA: Diagnosis not present

## 2024-10-04 DIAGNOSIS — I5042 Chronic combined systolic (congestive) and diastolic (congestive) heart failure: Secondary | ICD-10-CM | POA: Diagnosis not present

## 2024-10-04 DIAGNOSIS — I872 Venous insufficiency (chronic) (peripheral): Secondary | ICD-10-CM | POA: Diagnosis not present

## 2024-10-04 DIAGNOSIS — Z8546 Personal history of malignant neoplasm of prostate: Secondary | ICD-10-CM | POA: Diagnosis not present

## 2024-10-04 DIAGNOSIS — Z48817 Encounter for surgical aftercare following surgery on the skin and subcutaneous tissue: Secondary | ICD-10-CM | POA: Diagnosis not present

## 2024-10-04 DIAGNOSIS — Z6836 Body mass index (BMI) 36.0-36.9, adult: Secondary | ICD-10-CM | POA: Diagnosis not present

## 2024-10-04 DIAGNOSIS — E1142 Type 2 diabetes mellitus with diabetic polyneuropathy: Secondary | ICD-10-CM | POA: Diagnosis not present

## 2024-10-04 DIAGNOSIS — K760 Fatty (change of) liver, not elsewhere classified: Secondary | ICD-10-CM | POA: Diagnosis not present

## 2024-10-04 DIAGNOSIS — Z794 Long term (current) use of insulin: Secondary | ICD-10-CM | POA: Diagnosis not present

## 2024-10-04 DIAGNOSIS — M109 Gout, unspecified: Secondary | ICD-10-CM | POA: Diagnosis not present

## 2024-10-04 DIAGNOSIS — N184 Chronic kidney disease, stage 4 (severe): Secondary | ICD-10-CM | POA: Diagnosis not present

## 2024-10-04 DIAGNOSIS — Z7985 Long-term (current) use of injectable non-insulin antidiabetic drugs: Secondary | ICD-10-CM | POA: Diagnosis not present

## 2024-10-04 DIAGNOSIS — I130 Hypertensive heart and chronic kidney disease with heart failure and stage 1 through stage 4 chronic kidney disease, or unspecified chronic kidney disease: Secondary | ICD-10-CM | POA: Diagnosis not present

## 2024-10-04 DIAGNOSIS — M726 Necrotizing fasciitis: Secondary | ICD-10-CM | POA: Diagnosis not present

## 2024-10-04 DIAGNOSIS — H409 Unspecified glaucoma: Secondary | ICD-10-CM | POA: Diagnosis not present

## 2024-10-05 DIAGNOSIS — N184 Chronic kidney disease, stage 4 (severe): Secondary | ICD-10-CM | POA: Diagnosis not present

## 2024-10-05 DIAGNOSIS — I129 Hypertensive chronic kidney disease with stage 1 through stage 4 chronic kidney disease, or unspecified chronic kidney disease: Secondary | ICD-10-CM | POA: Diagnosis not present

## 2024-10-05 DIAGNOSIS — E1122 Type 2 diabetes mellitus with diabetic chronic kidney disease: Secondary | ICD-10-CM | POA: Diagnosis not present

## 2024-10-05 DIAGNOSIS — N179 Acute kidney failure, unspecified: Secondary | ICD-10-CM | POA: Diagnosis not present

## 2024-10-05 DIAGNOSIS — Z992 Dependence on renal dialysis: Secondary | ICD-10-CM | POA: Diagnosis not present

## 2024-10-05 DIAGNOSIS — D631 Anemia in chronic kidney disease: Secondary | ICD-10-CM | POA: Diagnosis not present

## 2024-10-06 ENCOUNTER — Other Ambulatory Visit (HOSPITAL_COMMUNITY): Payer: Self-pay

## 2024-10-06 DIAGNOSIS — M109 Gout, unspecified: Secondary | ICD-10-CM | POA: Diagnosis not present

## 2024-10-06 DIAGNOSIS — I129 Hypertensive chronic kidney disease with stage 1 through stage 4 chronic kidney disease, or unspecified chronic kidney disease: Secondary | ICD-10-CM | POA: Diagnosis not present

## 2024-10-06 DIAGNOSIS — E039 Hypothyroidism, unspecified: Secondary | ICD-10-CM | POA: Diagnosis not present

## 2024-10-06 DIAGNOSIS — N184 Chronic kidney disease, stage 4 (severe): Secondary | ICD-10-CM | POA: Diagnosis not present

## 2024-10-06 DIAGNOSIS — Z6836 Body mass index (BMI) 36.0-36.9, adult: Secondary | ICD-10-CM | POA: Diagnosis not present

## 2024-10-06 DIAGNOSIS — E1142 Type 2 diabetes mellitus with diabetic polyneuropathy: Secondary | ICD-10-CM | POA: Diagnosis not present

## 2024-10-06 DIAGNOSIS — H409 Unspecified glaucoma: Secondary | ICD-10-CM | POA: Diagnosis not present

## 2024-10-06 DIAGNOSIS — Z794 Long term (current) use of insulin: Secondary | ICD-10-CM | POA: Diagnosis not present

## 2024-10-06 DIAGNOSIS — Z992 Dependence on renal dialysis: Secondary | ICD-10-CM | POA: Diagnosis not present

## 2024-10-06 DIAGNOSIS — E1122 Type 2 diabetes mellitus with diabetic chronic kidney disease: Secondary | ICD-10-CM | POA: Diagnosis not present

## 2024-10-06 DIAGNOSIS — Z8546 Personal history of malignant neoplasm of prostate: Secondary | ICD-10-CM | POA: Diagnosis not present

## 2024-10-06 DIAGNOSIS — G4733 Obstructive sleep apnea (adult) (pediatric): Secondary | ICD-10-CM | POA: Diagnosis not present

## 2024-10-06 DIAGNOSIS — Z7985 Long-term (current) use of injectable non-insulin antidiabetic drugs: Secondary | ICD-10-CM | POA: Diagnosis not present

## 2024-10-06 DIAGNOSIS — I5042 Chronic combined systolic (congestive) and diastolic (congestive) heart failure: Secondary | ICD-10-CM | POA: Diagnosis not present

## 2024-10-06 DIAGNOSIS — D631 Anemia in chronic kidney disease: Secondary | ICD-10-CM | POA: Diagnosis not present

## 2024-10-06 DIAGNOSIS — M726 Necrotizing fasciitis: Secondary | ICD-10-CM | POA: Diagnosis not present

## 2024-10-06 DIAGNOSIS — I872 Venous insufficiency (chronic) (peripheral): Secondary | ICD-10-CM | POA: Diagnosis not present

## 2024-10-06 DIAGNOSIS — N179 Acute kidney failure, unspecified: Secondary | ICD-10-CM | POA: Diagnosis not present

## 2024-10-06 DIAGNOSIS — I130 Hypertensive heart and chronic kidney disease with heart failure and stage 1 through stage 4 chronic kidney disease, or unspecified chronic kidney disease: Secondary | ICD-10-CM | POA: Diagnosis not present

## 2024-10-06 DIAGNOSIS — Z48817 Encounter for surgical aftercare following surgery on the skin and subcutaneous tissue: Secondary | ICD-10-CM | POA: Diagnosis not present

## 2024-10-06 DIAGNOSIS — Z556 Problems related to health literacy: Secondary | ICD-10-CM | POA: Diagnosis not present

## 2024-10-06 DIAGNOSIS — K760 Fatty (change of) liver, not elsewhere classified: Secondary | ICD-10-CM | POA: Diagnosis not present

## 2024-10-07 ENCOUNTER — Other Ambulatory Visit (HOSPITAL_COMMUNITY): Payer: Self-pay

## 2024-10-07 DIAGNOSIS — I509 Heart failure, unspecified: Secondary | ICD-10-CM | POA: Diagnosis not present

## 2024-10-07 DIAGNOSIS — N2889 Other specified disorders of kidney and ureter: Secondary | ICD-10-CM | POA: Diagnosis not present

## 2024-10-07 DIAGNOSIS — N179 Acute kidney failure, unspecified: Secondary | ICD-10-CM | POA: Diagnosis not present

## 2024-10-07 DIAGNOSIS — N186 End stage renal disease: Secondary | ICD-10-CM | POA: Diagnosis not present

## 2024-10-08 DIAGNOSIS — Z992 Dependence on renal dialysis: Secondary | ICD-10-CM | POA: Diagnosis not present

## 2024-10-08 DIAGNOSIS — Z556 Problems related to health literacy: Secondary | ICD-10-CM | POA: Diagnosis not present

## 2024-10-08 DIAGNOSIS — Z48817 Encounter for surgical aftercare following surgery on the skin and subcutaneous tissue: Secondary | ICD-10-CM | POA: Diagnosis not present

## 2024-10-08 DIAGNOSIS — D631 Anemia in chronic kidney disease: Secondary | ICD-10-CM | POA: Diagnosis not present

## 2024-10-08 DIAGNOSIS — E1142 Type 2 diabetes mellitus with diabetic polyneuropathy: Secondary | ICD-10-CM | POA: Diagnosis not present

## 2024-10-08 DIAGNOSIS — K760 Fatty (change of) liver, not elsewhere classified: Secondary | ICD-10-CM | POA: Diagnosis not present

## 2024-10-08 DIAGNOSIS — H409 Unspecified glaucoma: Secondary | ICD-10-CM | POA: Diagnosis not present

## 2024-10-08 DIAGNOSIS — E1122 Type 2 diabetes mellitus with diabetic chronic kidney disease: Secondary | ICD-10-CM | POA: Diagnosis not present

## 2024-10-08 DIAGNOSIS — M109 Gout, unspecified: Secondary | ICD-10-CM | POA: Diagnosis not present

## 2024-10-08 DIAGNOSIS — I872 Venous insufficiency (chronic) (peripheral): Secondary | ICD-10-CM | POA: Diagnosis not present

## 2024-10-08 DIAGNOSIS — I130 Hypertensive heart and chronic kidney disease with heart failure and stage 1 through stage 4 chronic kidney disease, or unspecified chronic kidney disease: Secondary | ICD-10-CM | POA: Diagnosis not present

## 2024-10-08 DIAGNOSIS — Z8546 Personal history of malignant neoplasm of prostate: Secondary | ICD-10-CM | POA: Diagnosis not present

## 2024-10-08 DIAGNOSIS — I5042 Chronic combined systolic (congestive) and diastolic (congestive) heart failure: Secondary | ICD-10-CM | POA: Diagnosis not present

## 2024-10-08 DIAGNOSIS — E039 Hypothyroidism, unspecified: Secondary | ICD-10-CM | POA: Diagnosis not present

## 2024-10-08 DIAGNOSIS — Z6836 Body mass index (BMI) 36.0-36.9, adult: Secondary | ICD-10-CM | POA: Diagnosis not present

## 2024-10-08 DIAGNOSIS — Z794 Long term (current) use of insulin: Secondary | ICD-10-CM | POA: Diagnosis not present

## 2024-10-08 DIAGNOSIS — G4733 Obstructive sleep apnea (adult) (pediatric): Secondary | ICD-10-CM | POA: Diagnosis not present

## 2024-10-08 DIAGNOSIS — N184 Chronic kidney disease, stage 4 (severe): Secondary | ICD-10-CM | POA: Diagnosis not present

## 2024-10-08 DIAGNOSIS — Z7985 Long-term (current) use of injectable non-insulin antidiabetic drugs: Secondary | ICD-10-CM | POA: Diagnosis not present

## 2024-10-08 DIAGNOSIS — M726 Necrotizing fasciitis: Secondary | ICD-10-CM | POA: Diagnosis not present

## 2024-10-08 DIAGNOSIS — N179 Acute kidney failure, unspecified: Secondary | ICD-10-CM | POA: Diagnosis not present

## 2024-10-09 ENCOUNTER — Other Ambulatory Visit (HOSPITAL_COMMUNITY): Payer: Self-pay

## 2024-10-09 DIAGNOSIS — E1122 Type 2 diabetes mellitus with diabetic chronic kidney disease: Secondary | ICD-10-CM | POA: Diagnosis not present

## 2024-10-09 DIAGNOSIS — N179 Acute kidney failure, unspecified: Secondary | ICD-10-CM | POA: Diagnosis not present

## 2024-10-09 DIAGNOSIS — Z992 Dependence on renal dialysis: Secondary | ICD-10-CM | POA: Diagnosis not present

## 2024-10-09 DIAGNOSIS — D631 Anemia in chronic kidney disease: Secondary | ICD-10-CM | POA: Diagnosis not present

## 2024-10-09 MED ORDER — TORSEMIDE 100 MG PO TABS
100.0000 mg | ORAL_TABLET | Freq: Every morning | ORAL | 3 refills | Status: DC
  Filled 2024-10-09: qty 30, 30d supply, fill #0

## 2024-10-10 DIAGNOSIS — G4733 Obstructive sleep apnea (adult) (pediatric): Secondary | ICD-10-CM | POA: Diagnosis not present

## 2024-10-10 DIAGNOSIS — K760 Fatty (change of) liver, not elsewhere classified: Secondary | ICD-10-CM | POA: Diagnosis not present

## 2024-10-10 DIAGNOSIS — Z8546 Personal history of malignant neoplasm of prostate: Secondary | ICD-10-CM | POA: Diagnosis not present

## 2024-10-10 DIAGNOSIS — I5042 Chronic combined systolic (congestive) and diastolic (congestive) heart failure: Secondary | ICD-10-CM | POA: Diagnosis not present

## 2024-10-10 DIAGNOSIS — E039 Hypothyroidism, unspecified: Secondary | ICD-10-CM | POA: Diagnosis not present

## 2024-10-10 DIAGNOSIS — Z7985 Long-term (current) use of injectable non-insulin antidiabetic drugs: Secondary | ICD-10-CM | POA: Diagnosis not present

## 2024-10-10 DIAGNOSIS — Z556 Problems related to health literacy: Secondary | ICD-10-CM | POA: Diagnosis not present

## 2024-10-10 DIAGNOSIS — Z48817 Encounter for surgical aftercare following surgery on the skin and subcutaneous tissue: Secondary | ICD-10-CM | POA: Diagnosis not present

## 2024-10-10 DIAGNOSIS — I130 Hypertensive heart and chronic kidney disease with heart failure and stage 1 through stage 4 chronic kidney disease, or unspecified chronic kidney disease: Secondary | ICD-10-CM | POA: Diagnosis not present

## 2024-10-10 DIAGNOSIS — Z6836 Body mass index (BMI) 36.0-36.9, adult: Secondary | ICD-10-CM | POA: Diagnosis not present

## 2024-10-10 DIAGNOSIS — Z794 Long term (current) use of insulin: Secondary | ICD-10-CM | POA: Diagnosis not present

## 2024-10-10 DIAGNOSIS — M109 Gout, unspecified: Secondary | ICD-10-CM | POA: Diagnosis not present

## 2024-10-10 DIAGNOSIS — E1142 Type 2 diabetes mellitus with diabetic polyneuropathy: Secondary | ICD-10-CM | POA: Diagnosis not present

## 2024-10-10 DIAGNOSIS — H409 Unspecified glaucoma: Secondary | ICD-10-CM | POA: Diagnosis not present

## 2024-10-10 DIAGNOSIS — M726 Necrotizing fasciitis: Secondary | ICD-10-CM | POA: Diagnosis not present

## 2024-10-10 DIAGNOSIS — I872 Venous insufficiency (chronic) (peripheral): Secondary | ICD-10-CM | POA: Diagnosis not present

## 2024-10-10 DIAGNOSIS — N184 Chronic kidney disease, stage 4 (severe): Secondary | ICD-10-CM | POA: Diagnosis not present

## 2024-10-10 DIAGNOSIS — E1122 Type 2 diabetes mellitus with diabetic chronic kidney disease: Secondary | ICD-10-CM | POA: Diagnosis not present

## 2024-10-11 DIAGNOSIS — Z992 Dependence on renal dialysis: Secondary | ICD-10-CM | POA: Diagnosis not present

## 2024-10-11 DIAGNOSIS — D631 Anemia in chronic kidney disease: Secondary | ICD-10-CM | POA: Diagnosis not present

## 2024-10-11 DIAGNOSIS — E1122 Type 2 diabetes mellitus with diabetic chronic kidney disease: Secondary | ICD-10-CM | POA: Diagnosis not present

## 2024-10-11 DIAGNOSIS — N179 Acute kidney failure, unspecified: Secondary | ICD-10-CM | POA: Diagnosis not present

## 2024-10-12 ENCOUNTER — Other Ambulatory Visit: Payer: Self-pay

## 2024-10-12 DIAGNOSIS — D631 Anemia in chronic kidney disease: Secondary | ICD-10-CM | POA: Diagnosis not present

## 2024-10-12 DIAGNOSIS — E1122 Type 2 diabetes mellitus with diabetic chronic kidney disease: Secondary | ICD-10-CM | POA: Diagnosis not present

## 2024-10-12 DIAGNOSIS — N179 Acute kidney failure, unspecified: Secondary | ICD-10-CM | POA: Diagnosis not present

## 2024-10-12 DIAGNOSIS — Z992 Dependence on renal dialysis: Secondary | ICD-10-CM | POA: Diagnosis not present

## 2024-10-12 DIAGNOSIS — I129 Hypertensive chronic kidney disease with stage 1 through stage 4 chronic kidney disease, or unspecified chronic kidney disease: Secondary | ICD-10-CM | POA: Diagnosis not present

## 2024-10-12 DIAGNOSIS — N184 Chronic kidney disease, stage 4 (severe): Secondary | ICD-10-CM | POA: Diagnosis not present

## 2024-10-13 DIAGNOSIS — D631 Anemia in chronic kidney disease: Secondary | ICD-10-CM | POA: Diagnosis not present

## 2024-10-13 DIAGNOSIS — M7989 Other specified soft tissue disorders: Secondary | ICD-10-CM | POA: Diagnosis not present

## 2024-10-13 DIAGNOSIS — Z992 Dependence on renal dialysis: Secondary | ICD-10-CM | POA: Diagnosis not present

## 2024-10-13 DIAGNOSIS — N179 Acute kidney failure, unspecified: Secondary | ICD-10-CM | POA: Diagnosis not present

## 2024-10-13 DIAGNOSIS — Z9889 Other specified postprocedural states: Secondary | ICD-10-CM | POA: Diagnosis not present

## 2024-10-13 DIAGNOSIS — E1122 Type 2 diabetes mellitus with diabetic chronic kidney disease: Secondary | ICD-10-CM | POA: Diagnosis not present

## 2024-10-14 DIAGNOSIS — N184 Chronic kidney disease, stage 4 (severe): Secondary | ICD-10-CM | POA: Diagnosis not present

## 2024-10-14 DIAGNOSIS — M109 Gout, unspecified: Secondary | ICD-10-CM | POA: Diagnosis not present

## 2024-10-14 DIAGNOSIS — M726 Necrotizing fasciitis: Secondary | ICD-10-CM | POA: Diagnosis not present

## 2024-10-14 DIAGNOSIS — Z556 Problems related to health literacy: Secondary | ICD-10-CM | POA: Diagnosis not present

## 2024-10-14 DIAGNOSIS — Z8546 Personal history of malignant neoplasm of prostate: Secondary | ICD-10-CM | POA: Diagnosis not present

## 2024-10-14 DIAGNOSIS — E1142 Type 2 diabetes mellitus with diabetic polyneuropathy: Secondary | ICD-10-CM | POA: Diagnosis not present

## 2024-10-14 DIAGNOSIS — E039 Hypothyroidism, unspecified: Secondary | ICD-10-CM | POA: Diagnosis not present

## 2024-10-14 DIAGNOSIS — K760 Fatty (change of) liver, not elsewhere classified: Secondary | ICD-10-CM | POA: Diagnosis not present

## 2024-10-14 DIAGNOSIS — I130 Hypertensive heart and chronic kidney disease with heart failure and stage 1 through stage 4 chronic kidney disease, or unspecified chronic kidney disease: Secondary | ICD-10-CM | POA: Diagnosis not present

## 2024-10-14 DIAGNOSIS — H409 Unspecified glaucoma: Secondary | ICD-10-CM | POA: Diagnosis not present

## 2024-10-14 DIAGNOSIS — Z6836 Body mass index (BMI) 36.0-36.9, adult: Secondary | ICD-10-CM | POA: Diagnosis not present

## 2024-10-14 DIAGNOSIS — Z7985 Long-term (current) use of injectable non-insulin antidiabetic drugs: Secondary | ICD-10-CM | POA: Diagnosis not present

## 2024-10-14 DIAGNOSIS — Z794 Long term (current) use of insulin: Secondary | ICD-10-CM | POA: Diagnosis not present

## 2024-10-14 DIAGNOSIS — I872 Venous insufficiency (chronic) (peripheral): Secondary | ICD-10-CM | POA: Diagnosis not present

## 2024-10-14 DIAGNOSIS — G4733 Obstructive sleep apnea (adult) (pediatric): Secondary | ICD-10-CM | POA: Diagnosis not present

## 2024-10-14 DIAGNOSIS — Z48817 Encounter for surgical aftercare following surgery on the skin and subcutaneous tissue: Secondary | ICD-10-CM | POA: Diagnosis not present

## 2024-10-14 DIAGNOSIS — E1122 Type 2 diabetes mellitus with diabetic chronic kidney disease: Secondary | ICD-10-CM | POA: Diagnosis not present

## 2024-10-14 DIAGNOSIS — I5042 Chronic combined systolic (congestive) and diastolic (congestive) heart failure: Secondary | ICD-10-CM | POA: Diagnosis not present

## 2024-10-16 DIAGNOSIS — N179 Acute kidney failure, unspecified: Secondary | ICD-10-CM | POA: Diagnosis not present

## 2024-10-16 DIAGNOSIS — Z992 Dependence on renal dialysis: Secondary | ICD-10-CM | POA: Diagnosis not present

## 2024-10-16 DIAGNOSIS — D631 Anemia in chronic kidney disease: Secondary | ICD-10-CM | POA: Diagnosis not present

## 2024-10-16 DIAGNOSIS — E1122 Type 2 diabetes mellitus with diabetic chronic kidney disease: Secondary | ICD-10-CM | POA: Diagnosis not present

## 2024-10-19 ENCOUNTER — Other Ambulatory Visit: Payer: Self-pay

## 2024-10-19 ENCOUNTER — Other Ambulatory Visit (HOSPITAL_COMMUNITY): Payer: Self-pay

## 2024-10-20 ENCOUNTER — Other Ambulatory Visit (HOSPITAL_COMMUNITY): Payer: Self-pay

## 2024-10-20 DIAGNOSIS — E1122 Type 2 diabetes mellitus with diabetic chronic kidney disease: Secondary | ICD-10-CM | POA: Diagnosis not present

## 2024-10-20 DIAGNOSIS — K760 Fatty (change of) liver, not elsewhere classified: Secondary | ICD-10-CM | POA: Diagnosis not present

## 2024-10-20 DIAGNOSIS — I872 Venous insufficiency (chronic) (peripheral): Secondary | ICD-10-CM | POA: Diagnosis not present

## 2024-10-20 DIAGNOSIS — Z8546 Personal history of malignant neoplasm of prostate: Secondary | ICD-10-CM | POA: Diagnosis not present

## 2024-10-20 DIAGNOSIS — M109 Gout, unspecified: Secondary | ICD-10-CM | POA: Diagnosis not present

## 2024-10-20 DIAGNOSIS — N184 Chronic kidney disease, stage 4 (severe): Secondary | ICD-10-CM | POA: Diagnosis not present

## 2024-10-20 DIAGNOSIS — H409 Unspecified glaucoma: Secondary | ICD-10-CM | POA: Diagnosis not present

## 2024-10-20 DIAGNOSIS — Z7985 Long-term (current) use of injectable non-insulin antidiabetic drugs: Secondary | ICD-10-CM | POA: Diagnosis not present

## 2024-10-20 DIAGNOSIS — I5042 Chronic combined systolic (congestive) and diastolic (congestive) heart failure: Secondary | ICD-10-CM | POA: Diagnosis not present

## 2024-10-20 DIAGNOSIS — M726 Necrotizing fasciitis: Secondary | ICD-10-CM | POA: Diagnosis not present

## 2024-10-20 DIAGNOSIS — Z6836 Body mass index (BMI) 36.0-36.9, adult: Secondary | ICD-10-CM | POA: Diagnosis not present

## 2024-10-20 DIAGNOSIS — I130 Hypertensive heart and chronic kidney disease with heart failure and stage 1 through stage 4 chronic kidney disease, or unspecified chronic kidney disease: Secondary | ICD-10-CM | POA: Diagnosis not present

## 2024-10-20 DIAGNOSIS — E1142 Type 2 diabetes mellitus with diabetic polyneuropathy: Secondary | ICD-10-CM | POA: Diagnosis not present

## 2024-10-20 DIAGNOSIS — Z556 Problems related to health literacy: Secondary | ICD-10-CM | POA: Diagnosis not present

## 2024-10-20 DIAGNOSIS — Z48817 Encounter for surgical aftercare following surgery on the skin and subcutaneous tissue: Secondary | ICD-10-CM | POA: Diagnosis not present

## 2024-10-20 DIAGNOSIS — E039 Hypothyroidism, unspecified: Secondary | ICD-10-CM | POA: Diagnosis not present

## 2024-10-20 DIAGNOSIS — Z794 Long term (current) use of insulin: Secondary | ICD-10-CM | POA: Diagnosis not present

## 2024-10-20 DIAGNOSIS — G4733 Obstructive sleep apnea (adult) (pediatric): Secondary | ICD-10-CM | POA: Diagnosis not present

## 2024-10-21 ENCOUNTER — Other Ambulatory Visit (HOSPITAL_COMMUNITY): Payer: Self-pay

## 2024-10-21 DIAGNOSIS — Z7985 Long-term (current) use of injectable non-insulin antidiabetic drugs: Secondary | ICD-10-CM | POA: Diagnosis not present

## 2024-10-21 DIAGNOSIS — M109 Gout, unspecified: Secondary | ICD-10-CM | POA: Diagnosis not present

## 2024-10-21 DIAGNOSIS — I130 Hypertensive heart and chronic kidney disease with heart failure and stage 1 through stage 4 chronic kidney disease, or unspecified chronic kidney disease: Secondary | ICD-10-CM | POA: Diagnosis not present

## 2024-10-21 DIAGNOSIS — I872 Venous insufficiency (chronic) (peripheral): Secondary | ICD-10-CM | POA: Diagnosis not present

## 2024-10-21 DIAGNOSIS — Z8546 Personal history of malignant neoplasm of prostate: Secondary | ICD-10-CM | POA: Diagnosis not present

## 2024-10-21 DIAGNOSIS — E039 Hypothyroidism, unspecified: Secondary | ICD-10-CM | POA: Diagnosis not present

## 2024-10-21 DIAGNOSIS — Z794 Long term (current) use of insulin: Secondary | ICD-10-CM | POA: Diagnosis not present

## 2024-10-21 DIAGNOSIS — Z48817 Encounter for surgical aftercare following surgery on the skin and subcutaneous tissue: Secondary | ICD-10-CM | POA: Diagnosis not present

## 2024-10-21 DIAGNOSIS — E1142 Type 2 diabetes mellitus with diabetic polyneuropathy: Secondary | ICD-10-CM | POA: Diagnosis not present

## 2024-10-21 DIAGNOSIS — E1122 Type 2 diabetes mellitus with diabetic chronic kidney disease: Secondary | ICD-10-CM | POA: Diagnosis not present

## 2024-10-21 DIAGNOSIS — Z6836 Body mass index (BMI) 36.0-36.9, adult: Secondary | ICD-10-CM | POA: Diagnosis not present

## 2024-10-21 DIAGNOSIS — H409 Unspecified glaucoma: Secondary | ICD-10-CM | POA: Diagnosis not present

## 2024-10-21 DIAGNOSIS — I5042 Chronic combined systolic (congestive) and diastolic (congestive) heart failure: Secondary | ICD-10-CM | POA: Diagnosis not present

## 2024-10-21 DIAGNOSIS — N184 Chronic kidney disease, stage 4 (severe): Secondary | ICD-10-CM | POA: Diagnosis not present

## 2024-10-21 DIAGNOSIS — M726 Necrotizing fasciitis: Secondary | ICD-10-CM | POA: Diagnosis not present

## 2024-10-21 DIAGNOSIS — Z556 Problems related to health literacy: Secondary | ICD-10-CM | POA: Diagnosis not present

## 2024-10-21 DIAGNOSIS — G4733 Obstructive sleep apnea (adult) (pediatric): Secondary | ICD-10-CM | POA: Diagnosis not present

## 2024-10-21 DIAGNOSIS — K760 Fatty (change of) liver, not elsewhere classified: Secondary | ICD-10-CM | POA: Diagnosis not present

## 2024-10-21 MED ORDER — AMLODIPINE BESYLATE 5 MG PO TABS
5.0000 mg | ORAL_TABLET | Freq: Every day | ORAL | 3 refills | Status: AC
  Filled 2024-10-21: qty 90, 90d supply, fill #0

## 2024-10-22 ENCOUNTER — Other Ambulatory Visit (HOSPITAL_BASED_OUTPATIENT_CLINIC_OR_DEPARTMENT_OTHER): Payer: Self-pay

## 2024-10-22 ENCOUNTER — Emergency Department (HOSPITAL_BASED_OUTPATIENT_CLINIC_OR_DEPARTMENT_OTHER)
Admission: EM | Admit: 2024-10-22 | Discharge: 2024-10-22 | Disposition: A | Discharge status: Home / Self Care | Source: Ambulatory Visit | Attending: Emergency Medicine | Admitting: Emergency Medicine

## 2024-10-22 ENCOUNTER — Other Ambulatory Visit: Payer: Self-pay

## 2024-10-22 DIAGNOSIS — N186 End stage renal disease: Secondary | ICD-10-CM | POA: Diagnosis not present

## 2024-10-22 DIAGNOSIS — L02413 Cutaneous abscess of right upper limb: Secondary | ICD-10-CM | POA: Diagnosis not present

## 2024-10-22 DIAGNOSIS — Z992 Dependence on renal dialysis: Secondary | ICD-10-CM | POA: Insufficient documentation

## 2024-10-22 DIAGNOSIS — D72829 Elevated white blood cell count, unspecified: Secondary | ICD-10-CM | POA: Insufficient documentation

## 2024-10-22 DIAGNOSIS — L02414 Cutaneous abscess of left upper limb: Secondary | ICD-10-CM | POA: Diagnosis not present

## 2024-10-22 DIAGNOSIS — Z794 Long term (current) use of insulin: Secondary | ICD-10-CM | POA: Diagnosis not present

## 2024-10-22 DIAGNOSIS — E1122 Type 2 diabetes mellitus with diabetic chronic kidney disease: Secondary | ICD-10-CM | POA: Diagnosis not present

## 2024-10-22 DIAGNOSIS — R2231 Localized swelling, mass and lump, right upper limb: Secondary | ICD-10-CM | POA: Diagnosis not present

## 2024-10-22 DIAGNOSIS — L02419 Cutaneous abscess of limb, unspecified: Secondary | ICD-10-CM

## 2024-10-22 LAB — CBC WITH DIFFERENTIAL/PLATELET
Abs Immature Granulocytes: 0.05 K/uL (ref 0.00–0.07)
Basophils Absolute: 0.1 K/uL (ref 0.0–0.1)
Basophils Relative: 1 %
Eosinophils Absolute: 2.3 K/uL — ABNORMAL HIGH (ref 0.0–0.5)
Eosinophils Relative: 17 %
HCT: 32.5 % — ABNORMAL LOW (ref 39.0–52.0)
Hemoglobin: 10.7 g/dL — ABNORMAL LOW (ref 13.0–17.0)
Immature Granulocytes: 0 %
Lymphocytes Relative: 10 %
Lymphs Abs: 1.3 K/uL (ref 0.7–4.0)
MCH: 28.8 pg (ref 26.0–34.0)
MCHC: 32.9 g/dL (ref 30.0–36.0)
MCV: 87.6 fL (ref 80.0–100.0)
Monocytes Absolute: 1 K/uL (ref 0.1–1.0)
Monocytes Relative: 7 %
Neutro Abs: 8.5 K/uL — ABNORMAL HIGH (ref 1.7–7.7)
Neutrophils Relative %: 65 %
Platelets: 250 K/uL (ref 150–400)
RBC: 3.71 MIL/uL — ABNORMAL LOW (ref 4.22–5.81)
RDW: 17.7 % — ABNORMAL HIGH (ref 11.5–15.5)
WBC: 13.1 K/uL — ABNORMAL HIGH (ref 4.0–10.5)
nRBC: 0 % (ref 0.0–0.2)

## 2024-10-22 LAB — COMPREHENSIVE METABOLIC PANEL WITH GFR
ALT: 18 U/L (ref 0–44)
AST: 27 U/L (ref 15–41)
Albumin: 3.7 g/dL (ref 3.5–5.0)
Alkaline Phosphatase: 114 U/L (ref 38–126)
Anion gap: 9 (ref 5–15)
BUN: 12 mg/dL (ref 8–23)
CO2: 30 mmol/L (ref 22–32)
Calcium: 9.1 mg/dL (ref 8.9–10.3)
Chloride: 102 mmol/L (ref 98–111)
Creatinine, Ser: 2.45 mg/dL — ABNORMAL HIGH (ref 0.61–1.24)
GFR, Estimated: 28 mL/min — ABNORMAL LOW (ref >60)
Glucose, Bld: 158 mg/dL — ABNORMAL HIGH (ref 70–99)
Potassium: 3.9 mmol/L (ref 3.5–5.1)
Sodium: 140 mmol/L (ref 135–145)
Total Bilirubin: 0.5 mg/dL (ref 0.0–1.2)
Total Protein: 7.1 g/dL (ref 6.5–8.1)

## 2024-10-22 MED ORDER — DOXYCYCLINE HYCLATE 100 MG PO TABS
100.0000 mg | ORAL_TABLET | Freq: Once | ORAL | Status: AC
  Administered 2024-10-22: 100 mg via ORAL
  Filled 2024-10-22: qty 1

## 2024-10-22 MED ORDER — DOXYCYCLINE HYCLATE 100 MG PO CAPS
100.0000 mg | ORAL_CAPSULE | Freq: Two times a day (BID) | ORAL | 0 refills | Status: AC
  Filled 2024-10-22: qty 14, 7d supply, fill #0

## 2024-10-22 MED ORDER — LIDOCAINE-EPINEPHRINE (PF) 2 %-1:200000 IJ SOLN
10.0000 mL | Freq: Once | INTRAMUSCULAR | Status: AC
  Administered 2024-10-22: 10 mL via INTRADERMAL
  Filled 2024-10-22: qty 20

## 2024-10-22 NOTE — ED Triage Notes (Signed)
 Pt reports he came from dialysis where they told him his WBC count was elevated. He states dialysis was completed, dialysis M, T, TH, F. States hx of same w/ diagnosis of necrotizing fasciitis ~1 month ago w/ admission

## 2024-10-22 NOTE — Discharge Instructions (Signed)
 You were seen for skin infection (cellulitis) in the emergency department. The abscess underneath was drained in the emergency department.   At home, please take the antibiotics (doxycycline ) we have prescribed you.  You may also take tylenol  and ibuprofen for any pain that you have.   Check your MyChart online for the results of any tests that had not resulted by the time you left the emergency department.   Follow-up with your primary doctor in 2-3 days regarding your visit.    Return immediately to the emergency department if you experience any of the following: fevers, severe pain, rapid spread of the rash/redness, or any other concerning symptoms.    Thank you for visiting our Emergency Department. It was a pleasure taking care of you today.

## 2024-10-22 NOTE — ED Notes (Signed)
 DC paperwork given and verbally understood.

## 2024-10-22 NOTE — ED Provider Notes (Signed)
 [facility] EMERGENCY DEPARTMENT AT [facility] Provider Note   CSN: [id number] Arrival date & time: 10/22/24  1324     Patient presents with: Abnormal Lab   Brendan [name]. is a 68 y.o. male.   68 year old male history of diabetes and ESRD on IHD who presents to the emergency department with elevated white blood cell count.  Patient reports for the past few days she has had a small bump on his right forearm.  Initially appeared to be a pimple and then has grown.  No fevers or chills.  Says that it is painful.  Went to dialysis today and had blood work drawn that showed a white blood cell count of 12 and they told him to come back to the emergency department for evaluation.  He did have Fournier's gangrene that was treated in October of this year.  Says that he has been performing dressing changes at home and has not had any pain in that area, redness, drainage, or foul odor.       Prior to Admission medications   Medication Sig Start Date End Date Taking? Authorizing Provider  doxycycline  (VIBRAMYCIN ) 100 MG capsule Take 1 capsule (100 mg total) by mouth 2 (two) times daily for 7 days. 10/22/24 10/29/24 Yes Yolande Lamar BROCKS, MD  acetaminophen  (TYLENOL ) 500 MG tablet Take 500 mg by mouth every 6 (six) hours as needed for mild pain.    [provider]  amLODipine  (NORVASC ) 5 MG tablet Take 1 tablet (5 mg total) by mouth daily. 10/21/24     carvedilol  (COREG ) 12.5 MG tablet Take 1 tablet (12.5 mg total) by mouth 2 (two) times daily with a meal. Follow your blood pressure outpatient and get refills and adjustments with your PCP outpatient 09/22/24 10/22/24  Perri DELENA Meliton Raddle., MD  clobetasol  ointment (TEMOVATE ) 0.05 % Apply topically daily before sleeping. Patient not taking: Reported on 08/29/2024 05/19/24     Continuous Glucose Sensor (FREESTYLE LIBRE 3 PLUS SENSOR) MISC Apply to upper back of arm. Change every 15 days. 04/03/24     Continuous Glucose Sensor (FREESTYLE  LIBRE 3 SENSOR) MISC Apply to upper back of arm and change ever 14 days. 01/28/24   Faythe Purchase, MD  dorzolamide  (TRUSOPT ) 2 % ophthalmic solution Place 1 drop into both eyes daily.    [provider]  hydrALAZINE  (APRESOLINE ) 100 MG tablet Take 1/2 tablet (50 mg total) by mouth 2 (two) times daily. 08/30/23   Clegg, Amy D, NP  insulin  glargine (LANTUS  SOLOSTAR) 100 UNIT/ML Solostar Pen Inject 15 Units into the skin daily. Follow up with your PCP outpatient for further adjustment to your regimen 09/03/24   Perri DELENA Meliton Raddle., MD  insulin  lispro (HUMALOG  KWIKPEN) 200 UNIT/ML KwikPen Inject 0-15 Units into the skin 3 (three) times daily with meals. CBG < 70: treat low blood sugar CBG 70 - 120: 0 units  CBG 121 - 150: 2 units  CBG 151 - 200: 3 units  CBG 201 - 250: 5 units  CBG 251 - 300: 8 units  CBG 301 - 350: 11 units  CBG 351 - 400: 15 units  CBG >400: Call MD 09/03/24   Perri DELENA Meliton Raddle., MD  insulin  lispro (HUMALOG  KWIKPEN) 200 UNIT/ML KwikPen Inject 0-15 Units into the skin 3 (three) times daily with meals. 09/30/24 01/07/25    Insulin  Pen Needle (TECHLITE PEN NEEDLES) 32G X 4 MM MISC Use to inject insulin  4 times daily 10/25/22   Faythe Purchase, MD  Insulin  Pen Needle 32G X 4 MM MISC Use to inject insulin  up to 4 times daily. 12/26/23   Faythe Purchase, MD  levothyroxine  (SYNTHROID ) 125 MCG tablet Take 1 tablet (125 mcg total) by mouth every morning on an empty stomach 06/08/24     simvastatin  (ZOCOR ) 20 MG tablet Take 1 tablet (20 mg total) by mouth every evening. 07/09/24     timolol  (BETIMOL ) 0.5 % ophthalmic solution Place 1 drop into both eyes daily.    [provider]  tirzepatide  (MOUNJARO ) 15 MG/0.5ML Pen Inject 15 mg into the skin once a week. 04/14/24     torsemide  (DEMADEX ) 100 MG tablet Take 1 tablet (100 mg total) by mouth in the morning except on dialysis days 10/09/24   Marlee Bernardino NOVAK, MD    Allergies: Ace inhibitors, Maxidex  [dexamethasone ], and  Verapamil    Review of Systems  Updated Vital Signs BP (!) 158/77 (BP Location: Right Arm)   Pulse 68   Temp 97.7 F (36.5 C) (Temporal)   Resp 18   SpO2 100%   Physical Exam Constitutional:      Appearance: Normal appearance.  Eyes:     Extraocular Movements: Extraocular movements intact.     Conjunctiva/sclera: Conjunctivae normal.     Pupils: Pupils are equal, round, and reactive to light.  Cardiovascular:     Rate and Rhythm: Normal rate and regular rhythm.  Abdominal:     General: There is no distension.     Palpations: There is no mass.     Tenderness: There is no abdominal tenderness. There is no guarding.  Genitourinary:    Comments: Chaperoned by paramedic Josh  Testes and penis normal.  Wound from his Fournier's gangrene without any discharge or purulence.  Appears to be healing well. Skin:    Comments: 2 x 2 cm of induration and fluctuance on palmar aspect of right forearm.  Neurological:     Mental Status: He is alert.     (all labs ordered are listed, but only abnormal results are displayed) Labs Reviewed  COMPREHENSIVE METABOLIC PANEL WITH GFR - Abnormal; Notable for the following components:      Result Value   Glucose, Bld 158 (*)    Creatinine, Ser 2.45 (*)    GFR, Estimated 28 (*)    All other components within normal limits  CBC WITH DIFFERENTIAL/PLATELET - Abnormal; Notable for the following components:   WBC 13.1 (*)    RBC 3.71 (*)    Hemoglobin 10.7 (*)    HCT 32.5 (*)    RDW 17.7 (*)    Neutro Abs 8.5 (*)    Eosinophils Absolute 2.3 (*)    All other components within normal limits    EKG: None  Radiology: No results found.   .Incision and Drainage  Date/Time: 10/23/2024 10:19 AM  Performed by: Yolande Lamar BROCKS, MD Authorized by: Yolande Lamar BROCKS, MD   Consent:    Consent obtained:  Verbal   Consent given by:  Patient   Risks discussed:  Bleeding, incomplete drainage, pain, damage to other organs and infection    Alternatives discussed:  Alternative treatment Universal protocol:    Patient identity confirmed:  Verbally with patient Location:    Type:  Abscess   Size:  2   Location:  Upper extremity   Upper extremity location:  Arm   Arm location:  R lower arm Pre-procedure details:    Skin preparation:  Chlorhexidine  Sedation:    Sedation type:  None  Anesthesia:    Anesthesia method:  Local infiltration   Local anesthetic:  Lidocaine  2% WITH epi Procedure type:    Complexity:  Simple Procedure details:    Ultrasound guidance: yes     Incision types:  Single straight   Incision depth:  Subcutaneous   Wound management:  Probed and deloculated and irrigated with saline   Drainage:  Bloody and purulent   Drainage amount:  Scant   Wound treatment:  Wound left open   Packing materials:  None Post-procedure details:    Procedure completion:  Tolerated well, no immediate complications    EMERGENCY DEPARTMENT US  SOFT TISSUE INTERPRETATION Study: Limited Soft Tissue Ultrasound  INDICATIONS: Pain and Soft tissue infection Multiple views of the body part were obtained in real-time with a multi-frequency linear probe  PERFORMED BY: Myself IMAGES ARCHIVED?: Yes SIDE:Right  BODY PART:Upper extremity INTERPRETATION:  Abcess present  Medications Ordered in the ED  lidocaine -EPINEPHrine  (XYLOCAINE  W/EPI) 2 %-1:200000 (PF) injection 10 mL (10 mLs Intradermal Given by Other 10/22/24 1553)  doxycycline  (VIBRA -TABS) tablet 100 mg (100 mg Oral Given 10/22/24 1607)                                    Medical Decision Making Amount and/or Complexity of Data Reviewed Labs: ordered.  Risk Prescription drug management.   68 year old male history of ESRD and diabetes presents emergency department with elevated white blood cell count and abscess on his right forearm  Initial Ddx:  Abscess, cellulitis, sepsis, Fournier's gangrene  MDM/Course:  Patient presents to the emergency department with  what appears to be an abscess on his right forearm.  Does have some overlying cellulitis.  Had labs drawn at dialysis today and showed elevated white blood cell count and was referred into the emergency department for evaluation.  He is quite nervous because he did have Fournier's gangrene recently which required debridement.  Not complaining of any symptoms in his groin that would be concerning for recurrence of his Fournier's.  I did check that area today and there does not appear to be any ongoing infection.  Does have an abscess in his right forearm which I suspect is the cause of his leukocytosis.  He is not septic.  Discussed different options for treatment with him and he prefers to have an incision and drainage here.  Small amount of purulent material was expressed.  Given dose of antibiotics here and will continue them at home.  Will have him follow-up with his primary doctor in several days.    This patient presents to the ED for concern of complaints listed in HPI, this involves an extensive number of treatment options, and is a complaint that carries with it a high risk of complications and morbidity. Disposition including potential need for admission considered.   Dispo: DC Home. Return precautions discussed including, but not limited to, those listed in the AVS. Allowed pt time to ask questions which were answered fully prior to dc.  Records reviewed Outpatient Clinic Notes The following labs were independently interpreted: Chemistry and show CKD I have reviewed the patients home medications and made adjustments as needed Social Determinants of health:  Geriatric  Portions of this note were generated with Scientist, clinical (histocompatibility and immunogenetics). Dictation errors may occur despite best attempts at proofreading.     Final diagnoses:  Abscess of forearm  Leukocytosis, unspecified type    ED Discharge Orders  Ordered    doxycycline  (VIBRAMYCIN ) 100 MG capsule  2 times daily         10/22/24 1551               Yolande Lamar BROCKS, MD 10/23/24 1022

## 2024-10-23 DIAGNOSIS — L02419 Cutaneous abscess of limb, unspecified: Secondary | ICD-10-CM | POA: Diagnosis not present

## 2024-10-26 DIAGNOSIS — E1122 Type 2 diabetes mellitus with diabetic chronic kidney disease: Secondary | ICD-10-CM | POA: Diagnosis not present

## 2024-10-27 DIAGNOSIS — L400 Psoriasis vulgaris: Secondary | ICD-10-CM | POA: Diagnosis not present

## 2024-10-28 DIAGNOSIS — H409 Unspecified glaucoma: Secondary | ICD-10-CM | POA: Diagnosis not present

## 2024-10-28 DIAGNOSIS — I130 Hypertensive heart and chronic kidney disease with heart failure and stage 1 through stage 4 chronic kidney disease, or unspecified chronic kidney disease: Secondary | ICD-10-CM | POA: Diagnosis not present

## 2024-10-28 DIAGNOSIS — Z7985 Long-term (current) use of injectable non-insulin antidiabetic drugs: Secondary | ICD-10-CM | POA: Diagnosis not present

## 2024-10-28 DIAGNOSIS — E1122 Type 2 diabetes mellitus with diabetic chronic kidney disease: Secondary | ICD-10-CM | POA: Diagnosis not present

## 2024-10-28 DIAGNOSIS — Z8546 Personal history of malignant neoplasm of prostate: Secondary | ICD-10-CM | POA: Diagnosis not present

## 2024-10-28 DIAGNOSIS — Z556 Problems related to health literacy: Secondary | ICD-10-CM | POA: Diagnosis not present

## 2024-10-28 DIAGNOSIS — M109 Gout, unspecified: Secondary | ICD-10-CM | POA: Diagnosis not present

## 2024-10-28 DIAGNOSIS — G4733 Obstructive sleep apnea (adult) (pediatric): Secondary | ICD-10-CM | POA: Diagnosis not present

## 2024-10-28 DIAGNOSIS — Z48817 Encounter for surgical aftercare following surgery on the skin and subcutaneous tissue: Secondary | ICD-10-CM | POA: Diagnosis not present

## 2024-10-28 DIAGNOSIS — Z6836 Body mass index (BMI) 36.0-36.9, adult: Secondary | ICD-10-CM | POA: Diagnosis not present

## 2024-10-28 DIAGNOSIS — K760 Fatty (change of) liver, not elsewhere classified: Secondary | ICD-10-CM | POA: Diagnosis not present

## 2024-10-28 DIAGNOSIS — E1142 Type 2 diabetes mellitus with diabetic polyneuropathy: Secondary | ICD-10-CM | POA: Diagnosis not present

## 2024-10-28 DIAGNOSIS — N184 Chronic kidney disease, stage 4 (severe): Secondary | ICD-10-CM | POA: Diagnosis not present

## 2024-10-28 DIAGNOSIS — I5042 Chronic combined systolic (congestive) and diastolic (congestive) heart failure: Secondary | ICD-10-CM | POA: Diagnosis not present

## 2024-10-28 DIAGNOSIS — Z794 Long term (current) use of insulin: Secondary | ICD-10-CM | POA: Diagnosis not present

## 2024-10-28 DIAGNOSIS — I872 Venous insufficiency (chronic) (peripheral): Secondary | ICD-10-CM | POA: Diagnosis not present

## 2024-10-28 DIAGNOSIS — E039 Hypothyroidism, unspecified: Secondary | ICD-10-CM | POA: Diagnosis not present

## 2024-10-28 DIAGNOSIS — M726 Necrotizing fasciitis: Secondary | ICD-10-CM | POA: Diagnosis not present

## 2024-10-30 DIAGNOSIS — E1142 Type 2 diabetes mellitus with diabetic polyneuropathy: Secondary | ICD-10-CM | POA: Diagnosis not present

## 2024-10-30 DIAGNOSIS — Z794 Long term (current) use of insulin: Secondary | ICD-10-CM | POA: Diagnosis not present

## 2024-10-30 DIAGNOSIS — G4733 Obstructive sleep apnea (adult) (pediatric): Secondary | ICD-10-CM | POA: Diagnosis not present

## 2024-10-30 DIAGNOSIS — I872 Venous insufficiency (chronic) (peripheral): Secondary | ICD-10-CM | POA: Diagnosis not present

## 2024-10-30 DIAGNOSIS — I5042 Chronic combined systolic (congestive) and diastolic (congestive) heart failure: Secondary | ICD-10-CM | POA: Diagnosis not present

## 2024-10-30 DIAGNOSIS — K760 Fatty (change of) liver, not elsewhere classified: Secondary | ICD-10-CM | POA: Diagnosis not present

## 2024-10-30 DIAGNOSIS — Z8546 Personal history of malignant neoplasm of prostate: Secondary | ICD-10-CM | POA: Diagnosis not present

## 2024-10-30 DIAGNOSIS — E1122 Type 2 diabetes mellitus with diabetic chronic kidney disease: Secondary | ICD-10-CM | POA: Diagnosis not present

## 2024-10-30 DIAGNOSIS — Z48817 Encounter for surgical aftercare following surgery on the skin and subcutaneous tissue: Secondary | ICD-10-CM | POA: Diagnosis not present

## 2024-10-30 DIAGNOSIS — N184 Chronic kidney disease, stage 4 (severe): Secondary | ICD-10-CM | POA: Diagnosis not present

## 2024-10-30 DIAGNOSIS — M109 Gout, unspecified: Secondary | ICD-10-CM | POA: Diagnosis not present

## 2024-10-30 DIAGNOSIS — Z556 Problems related to health literacy: Secondary | ICD-10-CM | POA: Diagnosis not present

## 2024-10-30 DIAGNOSIS — I130 Hypertensive heart and chronic kidney disease with heart failure and stage 1 through stage 4 chronic kidney disease, or unspecified chronic kidney disease: Secondary | ICD-10-CM | POA: Diagnosis not present

## 2024-10-30 DIAGNOSIS — Z6836 Body mass index (BMI) 36.0-36.9, adult: Secondary | ICD-10-CM | POA: Diagnosis not present

## 2024-10-30 DIAGNOSIS — Z7985 Long-term (current) use of injectable non-insulin antidiabetic drugs: Secondary | ICD-10-CM | POA: Diagnosis not present

## 2024-10-30 DIAGNOSIS — M726 Necrotizing fasciitis: Secondary | ICD-10-CM | POA: Diagnosis not present

## 2024-10-30 DIAGNOSIS — H409 Unspecified glaucoma: Secondary | ICD-10-CM | POA: Diagnosis not present

## 2024-10-30 DIAGNOSIS — E039 Hypothyroidism, unspecified: Secondary | ICD-10-CM | POA: Diagnosis not present

## 2024-11-02 ENCOUNTER — Other Ambulatory Visit (HOSPITAL_COMMUNITY): Payer: Self-pay

## 2024-11-03 ENCOUNTER — Emergency Department (HOSPITAL_COMMUNITY)

## 2024-11-03 ENCOUNTER — Inpatient Hospital Stay (HOSPITAL_COMMUNITY): Admission: EM | Admit: 2024-11-03 | Discharge: 2024-11-06 | DRG: 082 | Disposition: A | Discharge status: Home Health

## 2024-11-03 ENCOUNTER — Observation Stay (HOSPITAL_COMMUNITY)

## 2024-11-03 ENCOUNTER — Other Ambulatory Visit: Payer: Self-pay

## 2024-11-03 ENCOUNTER — Encounter (HOSPITAL_COMMUNITY): Payer: Self-pay

## 2024-11-03 DIAGNOSIS — D631 Anemia in chronic kidney disease: Secondary | ICD-10-CM | POA: Diagnosis present

## 2024-11-03 DIAGNOSIS — S06319A Contusion and laceration of right cerebrum with loss of consciousness of unspecified duration, initial encounter: Secondary | ICD-10-CM | POA: Diagnosis present

## 2024-11-03 DIAGNOSIS — R29810 Facial weakness: Secondary | ICD-10-CM | POA: Diagnosis not present

## 2024-11-03 DIAGNOSIS — Z823 Family history of stroke: Secondary | ICD-10-CM

## 2024-11-03 DIAGNOSIS — R4781 Slurred speech: Secondary | ICD-10-CM | POA: Diagnosis not present

## 2024-11-03 DIAGNOSIS — I152 Hypertension secondary to endocrine disorders: Secondary | ICD-10-CM | POA: Diagnosis present

## 2024-11-03 DIAGNOSIS — E1169 Type 2 diabetes mellitus with other specified complication: Secondary | ICD-10-CM | POA: Diagnosis not present

## 2024-11-03 DIAGNOSIS — I872 Venous insufficiency (chronic) (peripheral): Secondary | ICD-10-CM | POA: Diagnosis present

## 2024-11-03 DIAGNOSIS — E1122 Type 2 diabetes mellitus with diabetic chronic kidney disease: Secondary | ICD-10-CM | POA: Diagnosis present

## 2024-11-03 DIAGNOSIS — S062XAA Diffuse traumatic brain injury with loss of consciousness status unknown, initial encounter: Secondary | ICD-10-CM | POA: Diagnosis not present

## 2024-11-03 DIAGNOSIS — S069X9A Unspecified intracranial injury with loss of consciousness of unspecified duration, initial encounter: Secondary | ICD-10-CM | POA: Diagnosis not present

## 2024-11-03 DIAGNOSIS — S199XXA Unspecified injury of neck, initial encounter: Secondary | ICD-10-CM | POA: Diagnosis not present

## 2024-11-03 DIAGNOSIS — R531 Weakness: Secondary | ICD-10-CM | POA: Diagnosis present

## 2024-11-03 DIAGNOSIS — R220 Localized swelling, mass and lump, head: Secondary | ICD-10-CM | POA: Diagnosis not present

## 2024-11-03 DIAGNOSIS — Z8249 Family history of ischemic heart disease and other diseases of the circulatory system: Secondary | ICD-10-CM

## 2024-11-03 DIAGNOSIS — H409 Unspecified glaucoma: Secondary | ICD-10-CM | POA: Diagnosis present

## 2024-11-03 DIAGNOSIS — Z79890 Hormone replacement therapy (postmenopausal): Secondary | ICD-10-CM

## 2024-11-03 DIAGNOSIS — D573 Sickle-cell trait: Secondary | ICD-10-CM | POA: Diagnosis present

## 2024-11-03 DIAGNOSIS — Z794 Long term (current) use of insulin: Secondary | ICD-10-CM | POA: Diagnosis not present

## 2024-11-03 DIAGNOSIS — Z48817 Encounter for surgical aftercare following surgery on the skin and subcutaneous tissue: Secondary | ICD-10-CM

## 2024-11-03 DIAGNOSIS — E119 Type 2 diabetes mellitus without complications: Secondary | ICD-10-CM | POA: Diagnosis not present

## 2024-11-03 DIAGNOSIS — K219 Gastro-esophageal reflux disease without esophagitis: Secondary | ICD-10-CM | POA: Diagnosis present

## 2024-11-03 DIAGNOSIS — R55 Syncope and collapse: Secondary | ICD-10-CM | POA: Diagnosis not present

## 2024-11-03 DIAGNOSIS — E7800 Pure hypercholesterolemia, unspecified: Secondary | ICD-10-CM | POA: Diagnosis present

## 2024-11-03 DIAGNOSIS — S069XAA Unspecified intracranial injury with loss of consciousness status unknown, initial encounter: Principal | ICD-10-CM | POA: Diagnosis present

## 2024-11-03 DIAGNOSIS — R4182 Altered mental status, unspecified: Secondary | ICD-10-CM | POA: Diagnosis not present

## 2024-11-03 DIAGNOSIS — Z860101 Personal history of adenomatous and serrated colon polyps: Secondary | ICD-10-CM

## 2024-11-03 DIAGNOSIS — Z7985 Long-term (current) use of injectable non-insulin antidiabetic drugs: Secondary | ICD-10-CM

## 2024-11-03 DIAGNOSIS — I5032 Chronic diastolic (congestive) heart failure: Secondary | ICD-10-CM | POA: Diagnosis present

## 2024-11-03 DIAGNOSIS — G4733 Obstructive sleep apnea (adult) (pediatric): Secondary | ICD-10-CM | POA: Diagnosis present

## 2024-11-03 DIAGNOSIS — E039 Hypothyroidism, unspecified: Secondary | ICD-10-CM | POA: Diagnosis not present

## 2024-11-03 DIAGNOSIS — W109XXA Fall (on) (from) unspecified stairs and steps, initial encounter: Secondary | ICD-10-CM | POA: Diagnosis present

## 2024-11-03 DIAGNOSIS — S299XXA Unspecified injury of thorax, initial encounter: Secondary | ICD-10-CM | POA: Diagnosis not present

## 2024-11-03 DIAGNOSIS — N186 End stage renal disease: Secondary | ICD-10-CM | POA: Diagnosis not present

## 2024-11-03 DIAGNOSIS — Z833 Family history of diabetes mellitus: Secondary | ICD-10-CM

## 2024-11-03 DIAGNOSIS — I132 Hypertensive heart and chronic kidney disease with heart failure and with stage 5 chronic kidney disease, or end stage renal disease: Secondary | ICD-10-CM | POA: Diagnosis present

## 2024-11-03 DIAGNOSIS — M47812 Spondylosis without myelopathy or radiculopathy, cervical region: Secondary | ICD-10-CM | POA: Diagnosis not present

## 2024-11-03 DIAGNOSIS — I517 Cardiomegaly: Secondary | ICD-10-CM | POA: Diagnosis not present

## 2024-11-03 DIAGNOSIS — Z9884 Bariatric surgery status: Secondary | ICD-10-CM

## 2024-11-03 DIAGNOSIS — I6782 Cerebral ischemia: Secondary | ICD-10-CM | POA: Diagnosis not present

## 2024-11-03 DIAGNOSIS — Z8546 Personal history of malignant neoplasm of prostate: Secondary | ICD-10-CM

## 2024-11-03 DIAGNOSIS — Z992 Dependence on renal dialysis: Secondary | ICD-10-CM | POA: Diagnosis not present

## 2024-11-03 DIAGNOSIS — R0989 Other specified symptoms and signs involving the circulatory and respiratory systems: Secondary | ICD-10-CM | POA: Diagnosis not present

## 2024-11-03 DIAGNOSIS — D329 Benign neoplasm of meninges, unspecified: Secondary | ICD-10-CM | POA: Diagnosis not present

## 2024-11-03 DIAGNOSIS — D320 Benign neoplasm of cerebral meninges: Secondary | ICD-10-CM | POA: Diagnosis present

## 2024-11-03 DIAGNOSIS — Z888 Allergy status to other drugs, medicaments and biological substances status: Secondary | ICD-10-CM

## 2024-11-03 DIAGNOSIS — Z79899 Other long term (current) drug therapy: Secondary | ICD-10-CM

## 2024-11-03 DIAGNOSIS — S0003XA Contusion of scalp, initial encounter: Secondary | ICD-10-CM | POA: Diagnosis not present

## 2024-11-03 DIAGNOSIS — E1159 Type 2 diabetes mellitus with other circulatory complications: Secondary | ICD-10-CM | POA: Diagnosis present

## 2024-11-03 DIAGNOSIS — Z6836 Body mass index (BMI) 36.0-36.9, adult: Secondary | ICD-10-CM

## 2024-11-03 DIAGNOSIS — S06329A Contusion and laceration of left cerebrum with loss of consciousness of unspecified duration, initial encounter: Secondary | ICD-10-CM | POA: Diagnosis present

## 2024-11-03 DIAGNOSIS — E7849 Other hyperlipidemia: Secondary | ICD-10-CM | POA: Diagnosis present

## 2024-11-03 DIAGNOSIS — K760 Fatty (change of) liver, not elsewhere classified: Secondary | ICD-10-CM | POA: Diagnosis present

## 2024-11-03 DIAGNOSIS — Y92018 Other place in single-family (private) house as the place of occurrence of the external cause: Secondary | ICD-10-CM

## 2024-11-03 DIAGNOSIS — E785 Hyperlipidemia, unspecified: Secondary | ICD-10-CM | POA: Diagnosis not present

## 2024-11-03 DIAGNOSIS — E669 Obesity, unspecified: Secondary | ICD-10-CM | POA: Diagnosis present

## 2024-11-03 DIAGNOSIS — S066X9A Traumatic subarachnoid hemorrhage with loss of consciousness of unspecified duration, initial encounter: Principal | ICD-10-CM | POA: Diagnosis present

## 2024-11-03 DIAGNOSIS — Z825 Family history of asthma and other chronic lower respiratory diseases: Secondary | ICD-10-CM

## 2024-11-03 DIAGNOSIS — S3993XA Unspecified injury of pelvis, initial encounter: Secondary | ICD-10-CM | POA: Diagnosis not present

## 2024-11-03 DIAGNOSIS — S066X0A Traumatic subarachnoid hemorrhage without loss of consciousness, initial encounter: Secondary | ICD-10-CM | POA: Diagnosis not present

## 2024-11-03 LAB — CBC
HCT: 32.9 % — ABNORMAL LOW (ref 39.0–52.0)
Hemoglobin: 10.8 g/dL — ABNORMAL LOW (ref 13.0–17.0)
MCH: 29.8 pg (ref 26.0–34.0)
MCHC: 32.8 g/dL (ref 30.0–36.0)
MCV: 90.9 fL (ref 80.0–100.0)
Platelets: 219 K/uL (ref 150–400)
RBC: 3.62 MIL/uL — ABNORMAL LOW (ref 4.22–5.81)
RDW: 18.6 % — ABNORMAL HIGH (ref 11.5–15.5)
WBC: 17.8 K/uL — ABNORMAL HIGH (ref 4.0–10.5)
nRBC: 0 % (ref 0.0–0.2)

## 2024-11-03 LAB — I-STAT CHEM 8, ED
BUN: 14 mg/dL (ref 8–23)
Calcium, Ion: 0.98 mmol/L — ABNORMAL LOW (ref 1.15–1.40)
Chloride: 102 mmol/L (ref 98–111)
Creatinine, Ser: 2.4 mg/dL — ABNORMAL HIGH (ref 0.61–1.24)
Glucose, Bld: 303 mg/dL — ABNORMAL HIGH (ref 70–99)
HCT: 35 % — ABNORMAL LOW (ref 39.0–52.0)
Hemoglobin: 11.9 g/dL — ABNORMAL LOW (ref 13.0–17.0)
Potassium: 3.6 mmol/L (ref 3.5–5.1)
Sodium: 139 mmol/L (ref 135–145)
TCO2: 25 mmol/L (ref 22–32)

## 2024-11-03 LAB — CBG MONITORING, ED: Glucose-Capillary: 261 mg/dL — ABNORMAL HIGH (ref 70–99)

## 2024-11-03 LAB — COMPREHENSIVE METABOLIC PANEL WITH GFR
ALT: 16 U/L (ref 0–44)
AST: 30 U/L (ref 15–41)
Albumin: 3.5 g/dL (ref 3.5–5.0)
Alkaline Phosphatase: 108 U/L (ref 38–126)
Anion gap: 12 (ref 5–15)
BUN: 13 mg/dL (ref 8–23)
CO2: 25 mmol/L (ref 22–32)
Calcium: 8.6 mg/dL — ABNORMAL LOW (ref 8.9–10.3)
Chloride: 101 mmol/L (ref 98–111)
Creatinine, Ser: 2.35 mg/dL — ABNORMAL HIGH (ref 0.61–1.24)
GFR, Estimated: 29 mL/min — ABNORMAL LOW (ref >60)
Glucose, Bld: 295 mg/dL — ABNORMAL HIGH (ref 70–99)
Potassium: 3.7 mmol/L (ref 3.5–5.1)
Sodium: 139 mmol/L (ref 135–145)
Total Bilirubin: 0.7 mg/dL (ref 0.0–1.2)
Total Protein: 7.2 g/dL (ref 6.5–8.1)

## 2024-11-03 LAB — SAMPLE TO BLOOD BANK

## 2024-11-03 LAB — ETHANOL: Alcohol, Ethyl (B): <15 mg/dL (ref <15)

## 2024-11-03 LAB — CK: Total CK: 107 U/L (ref 49–397)

## 2024-11-03 LAB — I-STAT CG4 LACTIC ACID, ED: Lactic Acid, Venous: 1.6 mmol/L (ref 0.5–1.9)

## 2024-11-03 MED ORDER — SODIUM CHLORIDE 0.9% FLUSH
3.0000 mL | Freq: Two times a day (BID) | INTRAVENOUS | Status: DC
  Administered 2024-11-04 – 2024-11-06 (×5): 3 mL via INTRAVENOUS

## 2024-11-03 MED ORDER — LEVOTHYROXINE SODIUM 25 MCG PO TABS
125.0000 ug | ORAL_TABLET | Freq: Every morning | ORAL | Status: DC
  Administered 2024-11-04 – 2024-11-06 (×3): 125 ug via ORAL
  Filled 2024-11-03 (×3): qty 1

## 2024-11-03 MED ORDER — CARVEDILOL 12.5 MG PO TABS
12.5000 mg | ORAL_TABLET | Freq: Two times a day (BID) | ORAL | Status: DC
  Administered 2024-11-04: 08:00:00 12.5 mg via ORAL
  Filled 2024-11-03: qty 1

## 2024-11-03 MED ORDER — GADOBUTROL 1 MMOL/ML IV SOLN
10.0000 mL | Freq: Once | INTRAVENOUS | Status: AC | PRN
  Administered 2024-11-03: 23:00:00 10 mL via INTRAVENOUS

## 2024-11-03 MED ORDER — ONDANSETRON HCL 4 MG/2ML IJ SOLN
4.0000 mg | Freq: Once | INTRAMUSCULAR | Status: AC
  Administered 2024-11-03: 20:00:00 4 mg via INTRAVENOUS

## 2024-11-03 MED ORDER — SODIUM CHLORIDE 0.9 % IV SOLN: 250.0000 mL | INTRAVENOUS | Status: AC | PRN

## 2024-11-03 MED ORDER — LABETALOL HCL 5 MG/ML IV SOLN: 10.0000 mg | INTRAVENOUS | Status: DC | PRN

## 2024-11-03 MED ORDER — INSULIN ASPART 100 UNIT/ML IJ SOLN
0.0000 [IU] | INTRAMUSCULAR | Status: DC
  Administered 2024-11-04 (×3): 2 [IU] via SUBCUTANEOUS
  Administered 2024-11-04: 3 [IU] via SUBCUTANEOUS
  Administered 2024-11-04: 13:00:00 2 [IU] via SUBCUTANEOUS
  Administered 2024-11-05: 17:00:00 3 [IU] via SUBCUTANEOUS
  Administered 2024-11-05 (×2): 1 [IU] via SUBCUTANEOUS
  Administered 2024-11-05: 3 [IU] via SUBCUTANEOUS
  Administered 2024-11-06 (×2): 5 [IU] via SUBCUTANEOUS
  Administered 2024-11-06: 3 [IU] via SUBCUTANEOUS
  Administered 2024-11-06: 6 [IU] via SUBCUTANEOUS
  Filled 2024-11-03: qty 1
  Filled 2024-11-03: qty 4
  Filled 2024-11-03 (×2): qty 2
  Filled 2024-11-03: qty 3
  Filled 2024-11-03 (×2): qty 2
  Filled 2024-11-03 (×2): qty 3

## 2024-11-03 MED ORDER — DIPHENHYDRAMINE HCL 50 MG/ML IJ SOLN
12.5000 mg | Freq: Once | INTRAMUSCULAR | Status: AC
  Administered 2024-11-03: 22:00:00 12.5 mg via INTRAVENOUS
  Filled 2024-11-03: qty 1

## 2024-11-03 MED ORDER — PROCHLORPERAZINE EDISYLATE 10 MG/2ML IJ SOLN: 5.0000 mg | Freq: Four times a day (QID) | INTRAMUSCULAR | Status: DC | PRN

## 2024-11-03 MED ORDER — LORAZEPAM 2 MG/ML IJ SOLN
0.5000 mg | INTRAMUSCULAR | Status: DC
  Filled 2024-11-03: qty 1

## 2024-11-03 MED ORDER — STROKE: EARLY STAGES OF RECOVERY BOOK: Freq: Once | Status: DC

## 2024-11-03 MED ORDER — ONDANSETRON HCL 4 MG/2ML IJ SOLN
INTRAMUSCULAR | Status: AC
  Filled 2024-11-03: qty 2

## 2024-11-03 MED ORDER — AMLODIPINE BESYLATE 5 MG PO TABS: 5.0000 mg | ORAL_TABLET | Freq: Every day | ORAL | Status: DC

## 2024-11-03 MED ORDER — LEVETIRACETAM (KEPPRA) 500 MG/5 ML ADULT IV PUSH
20.0000 mg/kg | Freq: Once | INTRAVENOUS | Status: AC
  Administered 2024-11-03: 21:00:00 2500 mg via INTRAVENOUS
  Filled 2024-11-03: qty 25

## 2024-11-03 MED ORDER — SODIUM CHLORIDE 0.9% FLUSH: 3.0000 mL | INTRAVENOUS | Status: DC | PRN

## 2024-11-03 MED ORDER — METOCLOPRAMIDE HCL 5 MG/ML IJ SOLN
10.0000 mg | Freq: Once | INTRAMUSCULAR | Status: AC
  Administered 2024-11-03: 22:00:00 10 mg via INTRAVENOUS
  Filled 2024-11-03: qty 2

## 2024-11-03 MED ORDER — ACETAMINOPHEN 160 MG/5ML PO SOLN: 650.0000 mg | ORAL | Status: DC | PRN

## 2024-11-03 MED ORDER — SENNA 8.6 MG PO TABS: 1.0000 | ORAL_TABLET | Freq: Every day | ORAL | Status: DC | PRN

## 2024-11-03 MED ORDER — ACETAMINOPHEN 325 MG PO TABS
650.0000 mg | ORAL_TABLET | ORAL | Status: DC | PRN
  Administered 2024-11-04: 650 mg via ORAL
  Filled 2024-11-03: qty 2

## 2024-11-03 MED ORDER — SIMVASTATIN 20 MG PO TABS: 20.0000 mg | ORAL_TABLET | Freq: Every evening | ORAL | Status: DC

## 2024-11-03 MED ORDER — SIMVASTATIN 20 MG PO TABS
20.0000 mg | ORAL_TABLET | Freq: Every evening | ORAL | Status: DC
  Administered 2024-11-04 – 2024-11-06 (×3): 20 mg via ORAL
  Filled 2024-11-03 (×3): qty 1

## 2024-11-03 MED ORDER — ACETAMINOPHEN 650 MG RE SUPP: 650.0000 mg | RECTAL | Status: DC | PRN

## 2024-11-03 NOTE — ED Notes (Signed)
 Patient returned from MRI.

## 2024-11-03 NOTE — ED Provider Notes (Signed)
  EMERGENCY DEPARTMENT AT [facility] Provider Note   CSN: [id number] Arrival date & time: 11/03/24  1948     Patient presents with: Loss of Consciousness, Head Injury, and Emesis   Brendan [name]. is a 68 y.o. male.  {Add pertinent medical, surgical, social history, OB history to HPI:8471} 68 year old male history of diabetes, Fournier's gangrene, and ESRD on IHD who presents to the emergency department with loss of consciousness.  History obtained per the patient, his wife, and EMS.  Patient was last known well at 2 PM today.  His wife came back to check on him and was found facedown on the floor at 6 PM.  He had garbled speech at that point in time and is not responding.  She also reports that he was making some grinding noises with his teeth.  Noticed a large hematoma to his forehead.  Patient does not recall what happened.  Not on blood thinners.       Prior to Admission medications  Medication Sig Start Date End Date Taking? Authorizing Provider  acetaminophen  (TYLENOL ) 500 MG tablet Take 500 mg by mouth every 6 (six) hours as needed for mild pain.    [provider]  amLODipine  (NORVASC ) 5 MG tablet Take 1 tablet (5 mg total) by mouth daily. 10/21/24     carvedilol  (COREG ) 12.5 MG tablet Take 1 tablet (12.5 mg total) by mouth 2 (two) times daily with a meal. Follow your blood pressure outpatient and get refills and adjustments with your PCP outpatient 09/22/24 10/22/24  Perri DELENA Meliton Raddle., MD  clobetasol  ointment (TEMOVATE ) 0.05 % Apply topically daily before sleeping. Patient not taking: Reported on 08/29/2024 05/19/24     Continuous Glucose Sensor (FREESTYLE LIBRE 3 PLUS SENSOR) MISC Apply to upper back of arm. Change every 15 days. 04/03/24     Continuous Glucose Sensor (FREESTYLE LIBRE 3 SENSOR) MISC Apply to upper back of arm and change ever 14 days. 01/28/24   Faythe Purchase, MD  dorzolamide  (TRUSOPT ) 2 % ophthalmic solution Place 1 drop into both  eyes daily.    [provider]  hydrALAZINE  (APRESOLINE ) 100 MG tablet Take 1/2 tablet (50 mg total) by mouth 2 (two) times daily. 08/30/23   Clegg, Amy D, NP  insulin  glargine (LANTUS  SOLOSTAR) 100 UNIT/ML Solostar Pen Inject 15 Units into the skin daily. Follow up with your PCP outpatient for further adjustment to your regimen 09/03/24   Perri DELENA Meliton Raddle., MD  insulin  lispro (HUMALOG  KWIKPEN) 200 UNIT/ML KwikPen Inject 0-15 Units into the skin 3 (three) times daily with meals. CBG < 70: treat low blood sugar CBG 70 - 120: 0 units  CBG 121 - 150: 2 units  CBG 151 - 200: 3 units  CBG 201 - 250: 5 units  CBG 251 - 300: 8 units  CBG 301 - 350: 11 units  CBG 351 - 400: 15 units  CBG >400: Call MD 09/03/24   Perri DELENA Meliton Raddle., MD  insulin  lispro (HUMALOG  KWIKPEN) 200 UNIT/ML KwikPen Inject 0-15 Units into the skin 3 (three) times daily with meals. 09/30/24 01/07/25    Insulin  Pen Needle (TECHLITE PEN NEEDLES) 32G X 4 MM MISC Use to inject insulin  4 times daily 10/25/22   Faythe Purchase, MD  Insulin  Pen Needle 32G X 4 MM MISC Use to inject insulin  up to 4 times daily. 12/26/23   Faythe Purchase, MD  levothyroxine  (SYNTHROID ) 125 MCG tablet Take 1 tablet (125 mcg total) by mouth  every morning on an empty stomach 06/08/24     simvastatin  (ZOCOR ) 20 MG tablet Take 1 tablet (20 mg total) by mouth every evening. 07/09/24     timolol  (BETIMOL ) 0.5 % ophthalmic solution Place 1 drop into both eyes daily.    [provider]  tirzepatide  (MOUNJARO ) 15 MG/0.5ML Pen Inject 15 mg into the skin once a week. 04/14/24     torsemide  (DEMADEX ) 100 MG tablet Take 1 tablet (100 mg total) by mouth in the morning except on dialysis days 10/09/24   Marlee Bernardino NOVAK, MD    Allergies: Ace inhibitors, Maxidex  [dexamethasone ], and Verapamil    Review of Systems  Updated Vital Signs BP (!) 156/81 (BP Location: Right Arm)   Pulse 72   Temp (!) 97.2 F (36.2 C) (Oral)   Resp (!) 22   Ht 6' 2 (1.88 m)    Wt 127.7 kg   SpO2 100%   BMI 36.15 kg/m   Physical Exam Vitals and nursing note reviewed.  Constitutional:      Appearance: He is well-developed.     Comments: Drowsy but responsive  HENT:     Head: Atraumatic.     Comments: Hematoma to left forehead.    Right Ear: External ear normal.     Left Ear: External ear normal.     Nose: Nose normal.  Eyes:     Conjunctiva/sclera: Conjunctivae normal.     Pupils: Pupils are equal, round, and reactive to light.     Comments: Pupils 4 mm bilaterally. EOM intact but does have spontaneous nystagmus with fast beating to the right.  Neck:     Comments: No C-spine midline tenderness to palpation.  Placed in c-collar Cardiovascular:     Rate and Rhythm: Normal rate and regular rhythm.     Heart sounds: Normal heart sounds.  Pulmonary:     Effort: Pulmonary effort is normal. No respiratory distress.     Breath sounds: Normal breath sounds.  Abdominal:     General: There is no distension.     Palpations: Abdomen is soft. There is no mass.     Tenderness: There is no abdominal tenderness. There is no guarding.  Musculoskeletal:     Right lower leg: Edema present.     Left lower leg: Edema present.     Comments: No tenderness palpation of bilateral shoulders, elbows, wrists, hips, knees, or ankles  Skin:    General: Skin is warm and dry.  Neurological:     Mental Status: He is alert.     Comments: Rightward fast beating nystagmus.  Cranial nerves otherwise grossly intact.  No drift of upper extremities after 10 seconds.  No drift of lower extremities after 5 seconds.  Intact sensation to light touch in face, arms, and legs  Psychiatric:        Mood and Affect: Mood normal.        Behavior: Behavior normal.     (all labs ordered are listed, but only abnormal results are displayed) Labs Reviewed  CBG MONITORING, ED - Abnormal; Notable for the following components:      Result Value   Glucose-Capillary 261 (*)    All other components  within normal limits  COMPREHENSIVE METABOLIC PANEL WITH GFR  CBC  ETHANOL  URINALYSIS, ROUTINE W REFLEX MICROSCOPIC  URINE DRUG SCREEN  CK  I-STAT CHEM 8, ED  I-STAT CG4 LACTIC ACID, ED  SAMPLE TO BLOOD BANK    EKG: None  Radiology: No results  found.  {Document cardiac monitor, telemetry assessment procedure when appropriate:32947} Procedures   Medications Ordered in the ED - No data to display    {Click here for ABCD2, HEART and other calculators REFRESH Note before signing:1}                              Medical Decision Making Amount and/or Complexity of Data Reviewed Labs: ordered. Radiology: ordered.  Risk Prescription drug management.   ***  {Document critical care time when appropriate  Document review of labs and clinical decision tools ie CHADS2VASC2, etc  Document your independent review of radiology images and any outside records  Document your discussion with family members, caretakers and with consultants  Document social determinants of health affecting pt's care  Document your decision making why or why not admission, treatments were needed:32947:::1}   Final diagnoses:  None    ED Discharge Orders     None

## 2024-11-03 NOTE — ED Notes (Signed)
 TRN and RN placed patient in [place]

## 2024-11-03 NOTE — ED Triage Notes (Signed)
 Patient bib EMS from home. Wife found him at 1730 unconscious in the the floor with a large hematoma to the back of the head.  He originally had garbled speech but that has cleared up. Also he was weak on the right side. He is A&Ox3. He is unsure of what happened. He started vomiting on arrival to ED and EMS reports the back of the neck feels crunchy He went to HD this morning and the last he was seen normal was 2:30pm.   152/106 60 99 318CBG

## 2024-11-03 NOTE — Progress Notes (Signed)
 Orthopedic Tech Progress Note Patient Details:  Brendan [name] Dec 17, 1955 [id number]  Patient ID: [name], male   DOB: November 09, 1956, 68 y.o.   MRN: [id number] Checked in for level 2 trauma.  Morna Pink 11/03/2024, 8:16 PM

## 2024-11-03 NOTE — Consult Note (Signed)
 Reason for Consult:head injury Referring Physician: Kent, Riendeau. is an 68 y.o. male.  HPI: found down in his home by his wife at 1800. He had a receipt from [facility] medical for dialysis. That was at 1545. Unresponsive when found. EMS called and he was brought to the hospital for further evaluation.   Past Medical History:  Diagnosis Date   Acanthosis nigricans    Atopic dermatitis    CHF (congestive heart failure) (HCC)    CKD (chronic kidney disease) stage 4, GFR 15-29 ml/min (HCC) 01/08/2023   Diabetes (HCC) 11/19/2002   Erectile dysfunction    Fatty liver 11/19/2005   Glaucoma 11/19/2006   Gynecomastia 11/20/2007   Hx of adenomatous colonic polyps 08/26/2023   Hyperaldosteronism 11/19/1998   Hypercholesterolemia    Hyperlipidemia 2010   Hypertension 1999   Hypoglycemic reaction    Hypothyroidism 11/19/2002   Incontinence    Intermittent vertigo 11/19/2010   Left cervical radiculopathy 11/19/2010   Lumbar radiculopathy    Obesity    Peripheral neuropathy    Pollen allergies 11/19/2005   perennial   Prostate cancer (HCC) 11/19/2004   Reflux esophagitis 11/19/1993   Sickle cell trait 11/19/2004   Sleep apnea, obstructive 11/19/1998   uses a cpap   Venous insufficiency 11/20/2003   Vitamin D deficiency 11/19/2010    Past Surgical History:  Procedure Laterality Date   COLONOSCOPY  2006   normal   INCISION AND DRAINAGE OF WOUND N/A 08/28/2024   Procedure: IRRIGATION AND EXCISIONAL DEBRIDEMENT WOUND;  Surgeon: Lyndel Deward PARAS, MD;  Location: [facility] OR;  Service: General;  Laterality: N/A;  EXCISIONAL DEBRIDEMENT   INCISION AND DRAINAGE PERIRECTAL ABSCESS N/A 08/30/2024   Procedure: INCISION AND DRAINAGE OF PERINEAL WOUND;  Surgeon: Polly Cordella LABOR, MD;  Location: [facility] OR;  Service: General;  Laterality: N/A;   IR TUNNELED CENTRAL VENOUS CATH PLC W IMG  09/18/2024   IR TUNNELED CENTRAL VENOUS CATH PLC W IMG  09/22/2024   lap band surgery  2009   left  inguinal hernia repair  1998   LIPOMA EXCISION Left 04/17/2023   Procedure: MINOR EXCISION LEFT BUTTOCK SEBACEOUS CYST;  Surgeon: Lyndel Deward PARAS, MD;  Location: [facility];  Service: General;  Laterality: Left;   robotic prostatectomy  2008    Family History  Problem Relation Age of Onset   Heart failure Mother    Hypertension Father        deceased age 43   Heart attack Father    COPD Sister    CVA Sister    Diabetes Daughter    Colon cancer Neg Hx    Stomach cancer Neg Hx    Colon polyps Neg Hx    Esophageal cancer Neg Hx    Rectal cancer Neg Hx     Social History:  reports that he has never smoked. He has never used smokeless tobacco. He reports current alcohol use. He reports that he does not use drugs.  Allergies: Allergies[1]  Medications: I have reviewed the patient's current medications.  Results for orders placed or performed during the hospital encounter of 11/03/24 (from the past 48 hours)  CBG monitoring, ED     Status: Abnormal   Collection Time: 11/03/24  7:51 PM  Result Value Ref Range   Glucose-Capillary 261 (H) 70 - 99 mg/dL    Comment: Glucose reference range applies only to samples taken after fasting for at least 8 hours.  Comprehensive metabolic panel  Status: Abnormal   Collection Time: 11/03/24  8:02 PM  Result Value Ref Range   Sodium 139 135 - 145 mmol/L   Potassium 3.7 3.5 - 5.1 mmol/L   Chloride 101 98 - 111 mmol/L   CO2 25 22 - 32 mmol/L   Glucose, Bld 295 (H) 70 - 99 mg/dL    Comment: Glucose reference range applies only to samples taken after fasting for at least 8 hours.   BUN 13 8 - 23 mg/dL   Creatinine, Ser 7.64 (H) 0.61 - 1.24 mg/dL   Calcium 8.6 (L) 8.9 - 10.3 mg/dL   Total Protein 7.2 6.5 - 8.1 g/dL   Albumin  3.5 3.5 - 5.0 g/dL   AST 30 15 - 41 U/L    Comment: HEMOLYSIS AT THIS LEVEL MAY AFFECT RESULT   ALT 16 0 - 44 U/L   Alkaline Phosphatase 108 38 - 126 U/L   Total Bilirubin 0.7 0.0 - 1.2 mg/dL   GFR,  Estimated 29 (L) >60 mL/min    Comment: (NOTE) Calculated using the CKD-EPI Creatinine Equation (2021)    Anion gap 12 5 - 15    Comment: Performed at [facility], [address] [place], [place] [address]  CBC     Status: Abnormal   Collection Time: 11/03/24  8:02 PM  Result Value Ref Range   WBC 17.8 (H) 4.0 - 10.5 K/uL   RBC 3.62 (L) 4.22 - 5.81 MIL/uL   Hemoglobin 10.8 (L) 13.0 - 17.0 g/dL   HCT 67.0 (L) 60.9 - 47.9 %   MCV 90.9 80.0 - 100.0 fL   MCH 29.8 26.0 - 34.0 pg   MCHC 32.8 30.0 - 36.0 g/dL   RDW 81.3 (H) 88.4 - 84.4 %   Platelets 219 150 - 400 K/uL   nRBC 0.0 0.0 - 0.2 %    Comment: Performed at [facility], [address] [place], [place] [address]  Ethanol     Status: None   Collection Time: 11/03/24  8:02 PM  Result Value Ref Range   Alcohol, Ethyl (B) <15 <15 mg/dL    Comment: (NOTE) For medical purposes only. Performed at [facility], [address], [place], [place] [address]   CK     Status: None   Collection Time: 11/03/24  8:02 PM  Result Value Ref Range   Total CK 107 49 - 397 U/L    Comment: Performed at [facility], [address] [place], [place] [address]  Sample to Blood Bank     Status: None   Collection Time: 11/03/24  8:10 PM  Result Value Ref Range   Blood Bank Specimen SAMPLE AVAILABLE FOR TESTING    Sample Expiration      11/06/2024,2359 Performed at [facility], [address], [place], [place] [address]   I-Stat Chem 8, ED     Status: Abnormal   Collection Time: 11/03/24  8:17 PM  Result Value Ref Range   Sodium 139 135 - 145 mmol/L   Potassium 3.6 3.5 - 5.1 mmol/L   Chloride 102 98 - 111 mmol/L   BUN 14 8 - 23 mg/dL   Creatinine, Ser 7.59 (H) 0.61 - 1.24 mg/dL   Glucose, Bld 696 (H) 70 - 99 mg/dL    Comment: Glucose reference range applies only to samples taken after fasting for at least 8 hours.   Calcium, Ion 0.98 (L) 1.15 - 1.40 mmol/L   TCO2  25 22 - 32 mmol/L   Hemoglobin 11.9 (L) 13.0  - 17.0 g/dL   HCT 64.9 (L) 60.9 - 47.9 %  I-Stat Lactic Acid, ED     Status: None   Collection Time: 11/03/24  8:17 PM  Result Value Ref Range   Lactic Acid, Venous 1.6 0.5 - 1.9 mmol/L      Review of Systems  Constitutional: Negative.   HENT: Negative.    Eyes: Negative.   Respiratory: Negative.    Cardiovascular: Negative.   Gastrointestinal: Negative.   Endocrine: Positive for polyuria.  Genitourinary:        On dialysis  Musculoskeletal: Negative.   Skin: Negative.   Allergic/Immunologic: Negative.   Neurological:  Positive for light-headedness.  Psychiatric/Behavioral: Negative.     Blood pressure 131/64, pulse 67, temperature (!) 97.2 F (36.2 C), temperature source Oral, resp. rate (!) 21, height 6' 2 (1.88 m), weight 127.7 kg, SpO2 98%. Physical Exam Constitutional:      General: He is in acute distress.     Appearance: He is obese. He is ill-appearing.  HENT:     Head:     Comments: Bruising on forehead, and back of head    Right Ear: External ear normal.     Left Ear: External ear normal.     Nose: Nose normal.     Mouth/Throat:     Mouth: Mucous membranes are dry.     Pharynx: Oropharynx is clear.  Eyes:     Extraocular Movements: Extraocular movements intact.     Conjunctiva/sclera: Conjunctivae normal.     Pupils: Pupils are equal, round, and reactive to light.  Cardiovascular:     Rate and Rhythm: Normal rate and regular rhythm.  Pulmonary:     Effort: Pulmonary effort is normal.  Abdominal:     General: Abdomen is flat.  Genitourinary:    Penis: Normal.      Testes: Normal.  Musculoskeletal:        General: Normal range of motion.     Cervical back: Normal range of motion.  Skin:    General: Skin is warm and dry.  Neurological:     General: No focal deficit present.     Mental Status: He is oriented to person, place, and time. He is lethargic.     GCS: GCS eye subscore is 4. GCS verbal subscore is 5. GCS motor subscore is 6.     Cranial  Nerves: Cranial nerves 2-12 are intact.     Sensory: Sensation is intact.     Motor: Weakness present.     Deep Tendon Reflexes: Babinski sign absent on the right side. Babinski sign absent on the left side.     Comments: Gait not assessed Following all commands Moving all extremities Given multiple medications which may be responsible for the lethargy  Psychiatric:        Mood and Affect: Mood normal.        Behavior: Behavior normal.        Thought Content: Thought content normal.        Judgment: Judgment normal.     Assessment/Plan: Brendan [name]. is a 68 y.o. male Who was found down by his wife at home. Head CT showed no acute blood, possible frontal contusions. No skull fractures. Being admitted. Given Keppra  though no signs of a seizure. Evaluated by neurology, no evidence of a stroke on CT. Incidental right frontal meningioma identified. Will undergo an MRI to evaluate for an ischemic  event.  Will follow  Kaylah Chiasson 11/03/2024, 10:10 PM         [1]  Allergies Allergen Reactions   Ace Inhibitors Cough   Maxidex  [Dexamethasone ] Other (See Comments)    Sexual dysfunction   Verapamil Other (See Comments)    constipation

## 2024-11-03 NOTE — ED Notes (Signed)
 RN noticed a change in pupils during triage. EDP notified and came immediately to the bedside. EDP activated level 2 trauma. Charge nurse notified of patient needing a room.

## 2024-11-03 NOTE — ED Notes (Signed)
 Patient transported to CT with TRN and RN on monitor

## 2024-11-03 NOTE — H&P (Signed)
 History and Physical    Brendan [name]. FMW:[id number] DOB: 05/12/1956 DOA: 11/03/2024  PCP: Rexanne Ingle, MD   Patient coming from: Home   Chief Complaint: Found down, garbled speech   HPI: Brendan Creasy. is a 68 y.o. male with medical history significant for insulin -dependent diabetes mellitus, hypertension, hyperlipidemia, hypothyroidism, OSA on CPAP, Fournier's gangrene in October 2025, and CKD 5 recently started on dialysis, now presenting for evaluation after he was unconscious on the floor.  Initially, patient did not remember what had happened, but his mental status continued to improve in the ED and he now reports that he had lost his balance at the top of some steps after returning home from dialysis, fell down the steps, was able to crawl back up, but then had a second fall.  ED Course: Upon arrival to the ED, patient is found to be afebrile and saturating well on room air with mild tachypnea, normal HR, and stable BP.  Labs are most notable for glucose 295, creatinine 2.35, WBC 17,800, hemoglobin 10.8, and normal lactic acid.  Head CT is concerning for mild contusions along the inferior aspect of bilateral frontal lobes and right frontal convexity mass most consistent with meningioma.  Neurosurgery and neurology were consulted by the ED physician and 2500 mg IV Keppra , Benadryl , Zofran , and Reglan  were administered.  Review of Systems:  ROS limited by patient's clinical condition.  Past Medical History:  Diagnosis Date   Acanthosis nigricans    Atopic dermatitis    CHF (congestive heart failure) (HCC)    CKD (chronic kidney disease) stage 4, GFR 15-29 ml/min (HCC) 01/08/2023   Diabetes (HCC) 11/19/2002   Erectile dysfunction    Fatty liver 11/19/2005   Glaucoma 11/19/2006   Gynecomastia 11/20/2007   Hx of adenomatous colonic polyps 08/26/2023   Hyperaldosteronism 11/19/1998   Hypercholesterolemia    Hyperlipidemia 2010   Hypertension 1999   Hypoglycemic reaction     Hypothyroidism 11/19/2002   Incontinence    Intermittent vertigo 11/19/2010   Left cervical radiculopathy 11/19/2010   Lumbar radiculopathy    Obesity    Peripheral neuropathy    Pollen allergies 11/19/2005   perennial   Prostate cancer (HCC) 11/19/2004   Reflux esophagitis 11/19/1993   Sickle cell trait 11/19/2004   Sleep apnea, obstructive 11/19/1998   uses a cpap   Venous insufficiency 11/20/2003   Vitamin D deficiency 11/19/2010    Past Surgical History:  Procedure Laterality Date   COLONOSCOPY  2006   normal   INCISION AND DRAINAGE OF WOUND N/A 08/28/2024   Procedure: IRRIGATION AND EXCISIONAL DEBRIDEMENT WOUND;  Surgeon: Lyndel Deward PARAS, MD;  Location: [facility] OR;  Service: General;  Laterality: N/A;  EXCISIONAL DEBRIDEMENT   INCISION AND DRAINAGE PERIRECTAL ABSCESS N/A 08/30/2024   Procedure: INCISION AND DRAINAGE OF PERINEAL WOUND;  Surgeon: Polly Cordella LABOR, MD;  Location: [facility] OR;  Service: General;  Laterality: N/A;   IR TUNNELED CENTRAL VENOUS CATH PLC W IMG  09/18/2024   IR TUNNELED CENTRAL VENOUS CATH PLC W IMG  09/22/2024   lap band surgery  2009   left inguinal hernia repair  1998   LIPOMA EXCISION Left 04/17/2023   Procedure: MINOR EXCISION LEFT BUTTOCK SEBACEOUS CYST;  Surgeon: Lyndel Deward PARAS, MD;  Location: [facility];  Service: General;  Laterality: Left;   robotic prostatectomy  2008    Social History:   reports that he has never smoked. He has never used smokeless tobacco. He reports current alcohol  use. He reports that he does not use drugs.  Allergies[1]  Family History  Problem Relation Age of Onset   Heart failure Mother    Hypertension Father        deceased age 20   Heart attack Father    COPD Sister    CVA Sister    Diabetes Daughter    Colon cancer Neg Hx    Stomach cancer Neg Hx    Colon polyps Neg Hx    Esophageal cancer Neg Hx    Rectal cancer Neg Hx      Prior to Admission medications  Medication Sig  Start Date End Date Taking? Authorizing Provider  acetaminophen  (TYLENOL ) 500 MG tablet Take 500 mg by mouth every 6 (six) hours as needed for mild pain.    [provider]  amLODipine  (NORVASC ) 5 MG tablet Take 1 tablet (5 mg total) by mouth daily. 10/21/24     carvedilol  (COREG ) 12.5 MG tablet Take 1 tablet (12.5 mg total) by mouth 2 (two) times daily with a meal. Follow your blood pressure outpatient and get refills and adjustments with your PCP outpatient 09/22/24 10/22/24  Perri DELENA Meliton Mickey., MD  clobetasol  ointment (TEMOVATE ) 0.05 % Apply topically daily before sleeping. Patient not taking: Reported on 08/29/2024 05/19/24     Continuous Glucose Sensor (FREESTYLE LIBRE 3 PLUS SENSOR) MISC Apply to upper back of arm. Change every 15 days. 04/03/24     Continuous Glucose Sensor (FREESTYLE LIBRE 3 SENSOR) MISC Apply to upper back of arm and change ever 14 days. 01/28/24   Faythe Purchase, MD  dorzolamide  (TRUSOPT ) 2 % ophthalmic solution Place 1 drop into both eyes daily.    [provider]  hydrALAZINE  (APRESOLINE ) 100 MG tablet Take 1/2 tablet (50 mg total) by mouth 2 (two) times daily. 08/30/23   Clegg, Amy D, NP  insulin  glargine (LANTUS  SOLOSTAR) 100 UNIT/ML Solostar Pen Inject 15 Units into the skin daily. Follow up with your PCP outpatient for further adjustment to your regimen 09/03/24   Perri DELENA Meliton Mickey., MD  insulin  lispro (HUMALOG  KWIKPEN) 200 UNIT/ML KwikPen Inject 0-15 Units into the skin 3 (three) times daily with meals. CBG < 70: treat low blood sugar CBG 70 - 120: 0 units  CBG 121 - 150: 2 units  CBG 151 - 200: 3 units  CBG 201 - 250: 5 units  CBG 251 - 300: 8 units  CBG 301 - 350: 11 units  CBG 351 - 400: 15 units  CBG >400: Call MD 09/03/24   Perri DELENA Meliton Mickey., MD  insulin  lispro (HUMALOG  KWIKPEN) 200 UNIT/ML KwikPen Inject 0-15 Units into the skin 3 (three) times daily with meals. 09/30/24 01/07/25    Insulin  Pen Needle (TECHLITE PEN NEEDLES) 32G X 4 MM  MISC Use to inject insulin  4 times daily 10/25/22   Faythe Purchase, MD  Insulin  Pen Needle 32G X 4 MM MISC Use to inject insulin  up to 4 times daily. 12/26/23   Faythe Purchase, MD  levothyroxine  (SYNTHROID ) 125 MCG tablet Take 1 tablet (125 mcg total) by mouth every morning on an empty stomach 06/08/24     simvastatin  (ZOCOR ) 20 MG tablet Take 1 tablet (20 mg total) by mouth every evening. 07/09/24     timolol  (BETIMOL ) 0.5 % ophthalmic solution Place 1 drop into both eyes daily.    [provider]  tirzepatide  (MOUNJARO ) 15 MG/0.5ML Pen Inject 15 mg into the skin once a week. 04/14/24  torsemide  (DEMADEX ) 100 MG tablet Take 1 tablet (100 mg total) by mouth in the morning except on dialysis days 10/09/24   Marlee Bernardino NOVAK, MD    Physical Exam: Vitals:   11/03/24 2002 11/03/24 2006 11/03/24 2045 11/03/24 2130  BP:  124/68 (!) 145/61 131/64  Pulse:   78 67  Resp:   (!) 21   Temp:      TempSrc:      SpO2:   97% 98%  Weight: 127.7 kg     Height: 6' 2 (1.88 m)       Constitutional: NAD, no pallor or diaphoresis   Eyes: PERTLA, lids and conjunctivae normal, periorbital ecchymosis ENMT: Mucous membranes are moist. Posterior pharynx clear of any exudate or lesions.   Neck: supple, no masses  Respiratory: No wheezing, no crackles. No accessory muscle use.  Cardiovascular: S1 & S2 heard, regular rate and rhythm. Pretibial pitting edema bilaterally.   Abdomen: No tenderness, soft. Bowel sounds active.  Musculoskeletal: no clubbing / cyanosis. No joint deformity upper and lower extremities.   Skin: no significant rashes, lesions, ulcers. Warm, dry, well-perfused. Neurologic: CN 2-12 grossly intact. Moving all extremities. Sleeping, wakes to voice and answers questions appropriately but quickly returns to sleep.    Psychiatric: Calm. Cooperative.    Labs and Imaging on Admission: I have personally reviewed following labs and imaging studies  CBC: Recent Labs  Lab 11/03/24 2002  11/03/24 2017  WBC 17.8*  --   HGB 10.8* 11.9*  HCT 32.9* 35.0*  MCV 90.9  --   PLT 219  --    Basic Metabolic Panel: Recent Labs  Lab 11/03/24 2002 11/03/24 2017  NA 139 139  K 3.7 3.6  CL 101 102  CO2 25  --   GLUCOSE 295* 303*  BUN 13 14  CREATININE 2.35* 2.40*  CALCIUM 8.6*  --    GFR: Estimated Creatinine Clearance: 41.3 mL/min (A) (by C-G formula based on SCr of 2.4 mg/dL (H)). Liver Function Tests: Recent Labs  Lab 11/03/24 2002  AST 30  ALT 16  ALKPHOS 108  BILITOT 0.7  PROT 7.2  ALBUMIN  3.5   No results for input(s): LIPASE, AMYLASE in the last 168 hours. No results for input(s): AMMONIA in the last 168 hours. Coagulation Profile: No results for input(s): INR, PROTIME in the last 168 hours. Cardiac Enzymes: Recent Labs  Lab 11/03/24 2002  CKTOTAL 107   BNP (last 3 results) Recent Labs    08/28/24 1613  PROBNP 6,038.0*   HbA1C: No results for input(s): HGBA1C in the last 72 hours. CBG: Recent Labs  Lab 11/03/24 1951  GLUCAP 261*   Lipid Profile: No results for input(s): CHOL, HDL, LDLCALC, TRIG, CHOLHDL, LDLDIRECT in the last 72 hours. Thyroid  Function Tests: No results for input(s): TSH, T4TOTAL, FREET4, T3FREE, THYROIDAB in the last 72 hours. Anemia Panel: No results for input(s): VITAMINB12, FOLATE, FERRITIN, TIBC, IRON , RETICCTPCT in the last 72 hours. Urine analysis:    Component Value Date/Time   COLORURINE YELLOW 09/15/2024 2250   APPEARANCEUR HAZY (A) 09/15/2024 2250   LABSPEC 1.009 09/15/2024 2250   PHURINE 5.0 09/15/2024 2250   GLUCOSEU NEGATIVE 09/15/2024 2250   HGBUR NEGATIVE 09/15/2024 2250   BILIRUBINUR NEGATIVE 09/15/2024 2250   KETONESUR NEGATIVE 09/15/2024 2250   PROTEINUR 100 (A) 09/15/2024 2250   UROBILINOGEN 0.2 07/16/2007 0900   NITRITE NEGATIVE 09/15/2024 2250   LEUKOCYTESUR NEGATIVE 09/15/2024 2250   Sepsis Labs: @LABRCNTIP (procalcitonin:4,lacticidven:4) )No  results found for this  or any previous visit (from the past 240 hours).   Radiological Exams on Admission: Reviewed    EKG: Independently reviewed. Sinus rhythm, LAD.   Assessment/Plan   1. Fall with TBI; ?CVA  - Found down at home with LOC and scalp hematomas, does not remember the fall - Found on CT to have mild contusion along inferior frontal lobes and right frontal meningioma  - Appreciate neurology and neurosurgery assessment and recommendations  - Continue neuro checks, check MRI brain, repeat head CT at 8 hours   3. ESRD  - Completed HD on day of admission  - Renally-dose medications, restrict fluids    3. Type II DM  - A1c was 8.4% in October 2025  - Check CBGs and use low-intensity SSI    4. Hypertension  - Continue amlodipine  and carvedilol     5. HLD  - Continue simvastatin     6. Hypothyroidism  - Levothyroxine     7. OSA  - CPAP while sleeping     DVT prophylaxis: SCDs  Code Status: Full  Level of Care: Level of care: Progressive Family Communication: Wife at bedside   Disposition Plan:  Patient is from: Home  Anticipated d/c is to: TBD Anticipated d/c date is: Possibly as early as 12/17  Patient currently: Pending MRI brain, repeat CT, cardiac monitoring, echocardiogram  Consults called: Neurosurgery, neurology  Admission status: Observation    Evalene GORMAN Sprinkles, MD [facility]  11/03/2024, 10:49 PM       [1]  Allergies Allergen Reactions   Ace Inhibitors Cough   Maxidex  [Dexamethasone ] Other (See Comments)    Sexual dysfunction   Verapamil Other (See Comments)    constipation

## 2024-11-03 NOTE — ED Notes (Signed)
 Trauma Response Nurse Documentation   Brendan [name]. is a 68 y.o. male arriving to [facility] ED via EMS  On No antithrombotic. Trauma was activated as a Level 2 by ED RN and EDP based on the following trauma criteria GCS 10-14 associated with trauma or AVPU < A.  Patient cleared for CT by Dr. Yolande. Pt transported to CT with trauma response nurse present to monitor. RN remained with the patient throughout their absence from the department for clinical observation.   GCS 14.  History   Past Medical History:  Diagnosis Date   Acanthosis nigricans    Atopic dermatitis    CHF (congestive heart failure) (HCC)    CKD (chronic kidney disease) stage 4, GFR 15-29 ml/min (HCC) 01/08/2023   Diabetes (HCC) 11/19/2002   Erectile dysfunction    Fatty liver 11/19/2005   Glaucoma 11/19/2006   Gynecomastia 11/20/2007   Hx of adenomatous colonic polyps 08/26/2023   Hyperaldosteronism 11/19/1998   Hypercholesterolemia    Hyperlipidemia 2010   Hypertension 1999   Hypoglycemic reaction    Hypothyroidism 11/19/2002   Incontinence    Intermittent vertigo 11/19/2010   Left cervical radiculopathy 11/19/2010   Lumbar radiculopathy    Obesity    Peripheral neuropathy    Pollen allergies 11/19/2005   perennial   Prostate cancer (HCC) 11/19/2004   Reflux esophagitis 11/19/1993   Sickle cell trait 11/19/2004   Sleep apnea, obstructive 11/19/1998   uses a cpap   Venous insufficiency 11/20/2003   Vitamin D deficiency 11/19/2010     Past Surgical History:  Procedure Laterality Date   COLONOSCOPY  2006   normal   INCISION AND DRAINAGE OF WOUND N/A 08/28/2024   Procedure: IRRIGATION AND EXCISIONAL DEBRIDEMENT WOUND;  Surgeon: Lyndel Deward PARAS, MD;  Location: [facility] OR;  Service: General;  Laterality: N/A;  EXCISIONAL DEBRIDEMENT   INCISION AND DRAINAGE PERIRECTAL ABSCESS N/A 08/30/2024   Procedure: INCISION AND DRAINAGE OF PERINEAL WOUND;  Surgeon: Polly Cordella LABOR, MD;  Location: [facility] OR;   Service: General;  Laterality: N/A;   IR TUNNELED CENTRAL VENOUS CATH PLC W IMG  09/18/2024   IR TUNNELED CENTRAL VENOUS CATH PLC W IMG  09/22/2024   lap band surgery  2009   left inguinal hernia repair  1998   LIPOMA EXCISION Left 04/17/2023   Procedure: MINOR EXCISION LEFT BUTTOCK SEBACEOUS CYST;  Surgeon: Lyndel Deward PARAS, MD;  Location: [facility];  Service: General;  Laterality: Left;   robotic prostatectomy  2008     Initial Focused Assessment (If applicable, or please see trauma documentation): Airway: Intact, patent, midline symmetrical face. Teeth intact. Pt had 1 episode of emesis in ED upon arrival.  Breathing: Breath sounds = bilateral expiratory wheezes and diminished in the bases.  Circulation: Large hematoma (approx 10cm wide) to posterior occiput with associated pain. Hematoma between eyebrows and just above left eyebrow. (Approx 3cm wide). No external hemorrhage noted. Pulses intact throughout. C/O posterior HA 10/10. Disability: C-spine crepitus palpated by EMS but C-collar was not placed due to size and pt preference. MAE appropriately with equal strength and sensation throughout. A/Ox2 upon my assessment. PERRLA 3's brisk. Nystagmus present upon my arrival.   CT's Completed:   CT Head and CT C-Spine   Interventions:  [place] C-collar placed at 2010.  18G PIV to R [facility] Trauma labs drawn CT head and c-spine CXR Pelvic XR 4 zofran  given  Plan for disposition:  Other Awaiting scans and plan  Consults completed:  none at 2045.  Event Summary: Pt was last seen normal around 1400 today by his wife.  Per wife, she found him at 1730 unconscious in the laundry room floor (ceramic tile floor) face down. Pt has large hematoma to back of head and a smaller one to the front.  Pt is amnestic to events.  Pt vomited upon arrival to ED. [place] c-collar placed upon arrival. NIHSS currently a 0. Pt had episodes of nystagmus. Wife at bedside.  Pt has R [place] temp HD  cath  Dialysis pt - received today. No hx of szs per pt and wife.   Bedside handoff with ED RN Butler Gull.    LEBRON ROCKIE ORN  Trauma Response RN  Please call TRN at [phone] for further assistance.

## 2024-11-03 NOTE — ED Notes (Signed)
 Patient transported to MRI.   RN did not give Ativan  for MRI due to patient sleeping heavily. Patient has multiple other medication that can cause sleepiness.

## 2024-11-04 ENCOUNTER — Observation Stay (HOSPITAL_COMMUNITY)

## 2024-11-04 DIAGNOSIS — H409 Unspecified glaucoma: Secondary | ICD-10-CM | POA: Diagnosis present

## 2024-11-04 DIAGNOSIS — D573 Sickle-cell trait: Secondary | ICD-10-CM | POA: Diagnosis present

## 2024-11-04 DIAGNOSIS — I611 Nontraumatic intracerebral hemorrhage in hemisphere, cortical: Secondary | ICD-10-CM | POA: Diagnosis not present

## 2024-11-04 DIAGNOSIS — Z794 Long term (current) use of insulin: Secondary | ICD-10-CM | POA: Diagnosis not present

## 2024-11-04 DIAGNOSIS — E1122 Type 2 diabetes mellitus with diabetic chronic kidney disease: Secondary | ICD-10-CM | POA: Diagnosis present

## 2024-11-04 DIAGNOSIS — S069X9A Unspecified intracranial injury with loss of consciousness of unspecified duration, initial encounter: Secondary | ICD-10-CM | POA: Diagnosis not present

## 2024-11-04 DIAGNOSIS — W109XXA Fall (on) (from) unspecified stairs and steps, initial encounter: Secondary | ICD-10-CM | POA: Diagnosis present

## 2024-11-04 DIAGNOSIS — E1169 Type 2 diabetes mellitus with other specified complication: Secondary | ICD-10-CM | POA: Diagnosis present

## 2024-11-04 DIAGNOSIS — D320 Benign neoplasm of cerebral meninges: Secondary | ICD-10-CM | POA: Diagnosis present

## 2024-11-04 DIAGNOSIS — Y92018 Other place in single-family (private) house as the place of occurrence of the external cause: Secondary | ICD-10-CM | POA: Diagnosis not present

## 2024-11-04 DIAGNOSIS — S0990XA Unspecified injury of head, initial encounter: Secondary | ICD-10-CM | POA: Diagnosis not present

## 2024-11-04 DIAGNOSIS — S066X9A Traumatic subarachnoid hemorrhage with loss of consciousness of unspecified duration, initial encounter: Secondary | ICD-10-CM | POA: Diagnosis present

## 2024-11-04 DIAGNOSIS — R1110 Vomiting, unspecified: Secondary | ICD-10-CM | POA: Diagnosis present

## 2024-11-04 DIAGNOSIS — I5032 Chronic diastolic (congestive) heart failure: Secondary | ICD-10-CM | POA: Diagnosis present

## 2024-11-04 DIAGNOSIS — K760 Fatty (change of) liver, not elsewhere classified: Secondary | ICD-10-CM | POA: Diagnosis present

## 2024-11-04 DIAGNOSIS — E039 Hypothyroidism, unspecified: Secondary | ICD-10-CM | POA: Diagnosis present

## 2024-11-04 DIAGNOSIS — E1159 Type 2 diabetes mellitus with other circulatory complications: Secondary | ICD-10-CM | POA: Diagnosis present

## 2024-11-04 DIAGNOSIS — Z992 Dependence on renal dialysis: Secondary | ICD-10-CM | POA: Diagnosis not present

## 2024-11-04 DIAGNOSIS — I152 Hypertension secondary to endocrine disorders: Secondary | ICD-10-CM | POA: Diagnosis present

## 2024-11-04 DIAGNOSIS — N186 End stage renal disease: Secondary | ICD-10-CM | POA: Diagnosis present

## 2024-11-04 DIAGNOSIS — S06329A Contusion and laceration of left cerebrum with loss of consciousness of unspecified duration, initial encounter: Secondary | ICD-10-CM | POA: Diagnosis present

## 2024-11-04 DIAGNOSIS — I609 Nontraumatic subarachnoid hemorrhage, unspecified: Secondary | ICD-10-CM | POA: Diagnosis not present

## 2024-11-04 DIAGNOSIS — Z8249 Family history of ischemic heart disease and other diseases of the circulatory system: Secondary | ICD-10-CM | POA: Diagnosis not present

## 2024-11-04 DIAGNOSIS — E7800 Pure hypercholesterolemia, unspecified: Secondary | ICD-10-CM | POA: Diagnosis present

## 2024-11-04 DIAGNOSIS — G4733 Obstructive sleep apnea (adult) (pediatric): Secondary | ICD-10-CM | POA: Diagnosis present

## 2024-11-04 DIAGNOSIS — E669 Obesity, unspecified: Secondary | ICD-10-CM | POA: Diagnosis present

## 2024-11-04 DIAGNOSIS — D631 Anemia in chronic kidney disease: Secondary | ICD-10-CM | POA: Diagnosis present

## 2024-11-04 DIAGNOSIS — Z79890 Hormone replacement therapy (postmenopausal): Secondary | ICD-10-CM | POA: Diagnosis not present

## 2024-11-04 DIAGNOSIS — R220 Localized swelling, mass and lump, head: Secondary | ICD-10-CM | POA: Diagnosis not present

## 2024-11-04 DIAGNOSIS — I132 Hypertensive heart and chronic kidney disease with heart failure and with stage 5 chronic kidney disease, or end stage renal disease: Secondary | ICD-10-CM | POA: Diagnosis present

## 2024-11-04 DIAGNOSIS — S06319A Contusion and laceration of right cerebrum with loss of consciousness of unspecified duration, initial encounter: Secondary | ICD-10-CM | POA: Diagnosis present

## 2024-11-04 DIAGNOSIS — Z7985 Long-term (current) use of injectable non-insulin antidiabetic drugs: Secondary | ICD-10-CM | POA: Diagnosis not present

## 2024-11-04 LAB — URINE DRUG SCREEN
Amphetamines: NEGATIVE
Barbiturates: NEGATIVE
Benzodiazepines: NEGATIVE
Cocaine: NEGATIVE
Fentanyl: NEGATIVE
Methadone Scn, Ur: NEGATIVE
Opiates: NEGATIVE
Tetrahydrocannabinol: NEGATIVE

## 2024-11-04 LAB — CBG MONITORING, ED
Glucose-Capillary: 238 mg/dL — ABNORMAL HIGH (ref 70–99)
Glucose-Capillary: 245 mg/dL — ABNORMAL HIGH (ref 70–99)
Glucose-Capillary: 268 mg/dL — ABNORMAL HIGH (ref 70–99)

## 2024-11-04 LAB — URINALYSIS, MICROSCOPIC (REFLEX)

## 2024-11-04 LAB — URINALYSIS, ROUTINE W REFLEX MICROSCOPIC
Bilirubin Urine: NEGATIVE
Glucose, UA: >=500 mg/dL — AB
Ketones, ur: NEGATIVE mg/dL
Nitrite: NEGATIVE
Protein, ur: 100 mg/dL — AB
Specific Gravity, Urine: 1.02 (ref 1.005–1.030)
pH: 8 (ref 5.0–8.0)

## 2024-11-04 LAB — CBC
HCT: 32.8 % — ABNORMAL LOW (ref 39.0–52.0)
Hemoglobin: 10.6 g/dL — ABNORMAL LOW (ref 13.0–17.0)
MCH: 29.1 pg (ref 26.0–34.0)
MCHC: 32.3 g/dL (ref 30.0–36.0)
MCV: 90.1 fL (ref 80.0–100.0)
Platelets: 232 K/uL (ref 150–400)
RBC: 3.64 MIL/uL — ABNORMAL LOW (ref 4.22–5.81)
RDW: 18.4 % — ABNORMAL HIGH (ref 11.5–15.5)
WBC: 13.9 K/uL — ABNORMAL HIGH (ref 4.0–10.5)
nRBC: 0 % (ref 0.0–0.2)

## 2024-11-04 LAB — BASIC METABOLIC PANEL WITH GFR
Anion gap: 12 (ref 5–15)
BUN: 15 mg/dL (ref 8–23)
CO2: 25 mmol/L (ref 22–32)
Calcium: 8.5 mg/dL — ABNORMAL LOW (ref 8.9–10.3)
Chloride: 102 mmol/L (ref 98–111)
Creatinine, Ser: 2.51 mg/dL — ABNORMAL HIGH (ref 0.61–1.24)
GFR, Estimated: 27 mL/min — ABNORMAL LOW (ref >60)
Glucose, Bld: 284 mg/dL — ABNORMAL HIGH (ref 70–99)
Potassium: 3.4 mmol/L — ABNORMAL LOW (ref 3.5–5.1)
Sodium: 139 mmol/L (ref 135–145)

## 2024-11-04 LAB — GLUCOSE, CAPILLARY
Glucose-Capillary: 143 mg/dL — ABNORMAL HIGH (ref 70–99)
Glucose-Capillary: 144 mg/dL — ABNORMAL HIGH (ref 70–99)
Glucose-Capillary: 204 mg/dL — ABNORMAL HIGH (ref 70–99)
Glucose-Capillary: 207 mg/dL — ABNORMAL HIGH (ref 70–99)

## 2024-11-04 MED ORDER — CHLORHEXIDINE GLUCONATE CLOTH 2 % EX PADS
6.0000 | MEDICATED_PAD | Freq: Every day | CUTANEOUS | Status: DC
  Administered 2024-11-04 – 2024-11-06 (×3): 6 via TOPICAL

## 2024-11-04 MED ORDER — FENTANYL CITRATE (PF) 50 MCG/ML IJ SOSY
12.5000 ug | PREFILLED_SYRINGE | INTRAMUSCULAR | Status: DC | PRN
  Administered 2024-11-05: 22:00:00 50 ug via INTRAVENOUS
  Filled 2024-11-04: qty 1

## 2024-11-04 MED ORDER — LEVETIRACETAM 500 MG PO TABS
500.0000 mg | ORAL_TABLET | Freq: Every day | ORAL | Status: DC
  Administered 2024-11-04 – 2024-11-06 (×3): 500 mg via ORAL
  Filled 2024-11-04 (×3): qty 1

## 2024-11-04 MED ORDER — SODIUM CHLORIDE 0.9 % IV SOLN: INTRAVENOUS | Status: DC

## 2024-11-04 NOTE — Plan of Care (Signed)
  Problem: Education: Goal: Ability to describe self-care measures that may prevent or decrease complications (Diabetes Survival Skills Education) will improve Outcome: Progressing Goal: Individualized Educational Video(s) Outcome: Progressing   Problem: Coping: Goal: Ability to adjust to condition or change in health will improve Outcome: Progressing   Problem: Fluid Volume: Goal: Ability to maintain a balanced intake and output will improve Outcome: Progressing   Problem: Health Behavior/Discharge Planning: Goal: Ability to identify and utilize available resources and services will improve Outcome: Progressing Goal: Ability to manage health-related needs will improve Outcome: Progressing   Problem: Metabolic: Goal: Ability to maintain appropriate glucose levels will improve Outcome: Progressing   Problem: Nutritional: Goal: Maintenance of adequate nutrition will improve Outcome: Progressing Goal: Progress toward achieving an optimal weight will improve Outcome: Progressing   Problem: Skin Integrity: Goal: Risk for impaired skin integrity will decrease Outcome: Progressing   Problem: Tissue Perfusion: Goal: Adequacy of tissue perfusion will improve Outcome: Progressing   Problem: Education: Goal: Knowledge of disease or condition will improve Outcome: Progressing Goal: Knowledge of secondary prevention will improve (MUST DOCUMENT ALL) Outcome: Progressing Goal: Knowledge of patient specific risk factors will improve (DELETE if not current risk factor) Outcome: Progressing   Problem: Ischemic Stroke/TIA Tissue Perfusion: Goal: Complications of ischemic stroke/TIA will be minimized Outcome: Progressing   Problem: Coping: Goal: Will verbalize positive feelings about self Outcome: Progressing Goal: Will identify appropriate support needs Outcome: Progressing   Problem: Health Behavior/Discharge Planning: Goal: Ability to manage health-related needs will  improve Outcome: Progressing Goal: Goals will be collaboratively established with patient/family Outcome: Progressing   Problem: Self-Care: Goal: Ability to participate in self-care as condition permits will improve Outcome: Progressing Goal: Verbalization of feelings and concerns over difficulty with self-care will improve Outcome: Progressing Goal: Ability to communicate needs accurately will improve Outcome: Progressing   Problem: Nutrition: Goal: Risk of aspiration will decrease Outcome: Progressing Goal: Dietary intake will improve Outcome: Progressing

## 2024-11-04 NOTE — Consult Note (Signed)
 WOC Nurse Consult Note:  WOC consult performed remotely utilizing imaging and chart review  Reason for Consult:Perineum wound care  Wound type: surgical- fournier's gangrene  Pressure Injury POA: NA- not pressure  Measurement: see nursing flow sheets Wound bed: 100 % red, moist Drainage (amount, consistency, odor) see nursing flow sheets Periwound: intact Dressing procedure/placement/frequency:  Perineum: Cleanse with NS, pack wound with saline soaked gauze, cover with dry gauze and tape.  Change twice daily.     WOC team will not follow patient at this time, please re consult if new needs arise.  Thank you,  Doyal Polite, MSN, RN, [facility] WOC Team [phone] (Available Mon-Fri 0700-1500)

## 2024-11-04 NOTE — Inpatient Diabetes Management (Signed)
 Inpatient Diabetes Program Recommendations  AACE/ADA: New Consensus Statement on Inpatient Glycemic Control (2015)  Target Ranges:  Prepandial:   less than 140 mg/dL      Peak postprandial:   less than 180 mg/dL (1-2 hours)      Critically ill patients:  140 - 180 mg/dL    Latest Reference Range & Units 08/29/24 03:29  Hemoglobin A1C 4.8 - 5.6 % 8.4 (H)  (H): Data is abnormally high  Latest Reference Range & Units 11/03/24 19:51 11/04/24 00:01 11/04/24 04:59 11/04/24 07:34  Glucose-Capillary 70 - 99 mg/dL 738 (H) 731 (H)  3 units Novolog   245 (H)  2 units Novolog   238 (H)  2 units Novolog    (H): Data is abnormally high  Admit with: Fall with TBI; ?CVA   History: DM, ESRD (recently started on HD)  Home DM Meds: Freestyle Libre 3 CGM       Lantus  15 units daily       Humalog  0-15 units TID per SSI       Mounjaro  15 mg Qweek  Current Orders: Novolog  0-6 units Q4H   MD- Note pt takes Lantus  insulin  at home.  CBG 238 at 7am today.  Please consider starting 50% home dose Lantus :  Semglee  8 units Daily (please start this AM)   ENDO: Dr. Faythe Last Seen 10/26/2024 Was noted to be taking: Lantus  20 units daily Humalog  8-16 units BID with Lunch and Dinner meals Mounjaro  15 mg Qweek     --Will follow patient during hospitalization--  Adina Rudolpho Arrow RN, MSN, CDCES Diabetes Coordinator Inpatient Glycemic Control Team Team Pager: [phone] (8a-5p)

## 2024-11-04 NOTE — Evaluation (Signed)
 Occupational Therapy Evaluation Patient Details Name: Brendan [name]. MRN: [id number] DOB: 25-Jan-1956 Today's Date: 11/04/2024   History of Present Illness   Pt is 68 year old [facility]  [facility] for fall with head trauma. MRI showed large scalp contusion, trace regional subarachnoid hemorrhage anterior frontal lobes, and small hemorrhagic contusions involving the rt greater than lt gyrus recti. PMH - ESRD on HD, necrotizing fasciitis, DM2, HTN, hypothyroidism, OSA, obesity, CKD 4, chronic diastolic CHF, glaucoma, gynecomastia, HLD, vertigo, obesity, peripheral neuropathy, prostate cancer, sickle cell trait     Clinical Impressions PTA, pt lives with spouse, typically Modified Independent with ADLs and mobility using Rollator. Pt presents now with deficits in pain (headache), endurance and strength. Pt requires Mod A for bed mobility but unable to progress beyond EOB d/t progressive dizziness w/ +orthostasis. Pt requiring increased assist for ADLs, bed level at this time d/t hypotension today. Based on current presentation, recommend intensive rehab services > 3 hours per day to maximize safety and independence with daily tasks.  Supine: 128/63  Sitting: 65/45  Supine: 93/48  Supine: 74/52     If plan is discharge home, recommend the following:   A lot of help with walking and/or transfers;A lot of help with bathing/dressing/bathroom     Functional Status Assessment   Patient has had a recent decline in their functional status and demonstrates the ability to make significant improvements in function in a reasonable and predictable amount of time.     Equipment Recommendations   Other (comment) (TBD pending progress)     Recommendations for Other Services   Rehab consult     Precautions/Restrictions   Precautions Precautions: Fall;Other (comment) Precaution/Restrictions Comments: watch BP Restrictions Weight Bearing Restrictions Per Provider Order: No      Mobility Bed Mobility Overal bed mobility: Needs Assistance Bed Mobility: Supine to Sit, Sit to Supine, Rolling Rolling: Min assist   Supine to sit: Mod assist Sit to supine: Mod assist   General bed mobility comments: Assist to elevate trunk into sitting. Assist to bring legs back up onto stretcher    Transfers                   General transfer comment: Unable to attempt due to orthostatic in sitting      Balance Overall balance assessment: Needs assistance Sitting-balance support: No upper extremity supported, Feet supported Sitting balance-Leahy Scale: Fair                                     ADL either performed or assessed with clinical judgement   ADL Overall ADL's : Needs assistance/impaired Eating/Feeding: NPO   Grooming: Sitting;Contact guard assist   Upper Body Bathing: Minimal assistance;Sitting   Lower Body Bathing: Maximal assistance;Bed level   Upper Body Dressing : Minimal assistance;Sitting   Lower Body Dressing: Maximal assistance;Bed level       Toileting- Clothing Manipulation and Hygiene: Total assistance;Bed level               Vision Ability to See in Adequate Light: 0 Adequate Patient Visual Report: Other (comment) (to be further assessed) Vision Assessment?: Vision impaired- to be further tested in functional context Additional Comments: to be further assessed- limited by dizziness upon sitting so unable to fully assess vision     Perception         Praxis  Pertinent Vitals/Pain Pain Assessment Pain Assessment: Faces Faces Pain Scale: Hurts little more Pain Location: head Pain Descriptors / Indicators: Aching, Grimacing Pain Intervention(s): Monitored during session     Extremity/Trunk Assessment Upper Extremity Assessment Upper Extremity Assessment: Overall WFL for tasks assessed;Right hand dominant   Lower Extremity Assessment Lower Extremity Assessment: Defer to PT evaluation    Cervical / Trunk Assessment Cervical / Trunk Assessment: Normal   Communication Communication Communication: No apparent difficulties   Cognition Arousal: Alert Behavior During Therapy: Flat affect Cognition: Cognition impaired     Awareness: Online awareness impaired   Attention impairment (select first level of impairment): Sustained attention, Selective attention   OT - Cognition Comments: difficult to fully assess d/t distraction by hypotension and headache. some slower processing/response time, appear to have fair recollection of PLOF. to be further assessed                 Following commands: Impaired Following commands impaired: Follows one step commands with increased time     Cueing  General Comments   Cueing Techniques: Verbal cues;Tactile cues  Wife at bedside   Exercises     Shoulder Instructions      Home Living Family/patient expects to be discharged to:: Private residence Living Arrangements: Spouse/significant other Available Help at Discharge: Family;Available PRN/intermittently Type of Home: House Home Access: Stairs to enter [employer] of Steps: 4 Entrance Stairs-Rails: Right;Left;Can reach both Home Layout: One level     Bathroom Shower/Tub: Producer, Television/film/video: Standard Bathroom Accessibility: Yes   Home Equipment: Cane - single point;Hand held Programmer, Systems (2 wheels);BSC/3in1;Rollator (4 wheels)   Additional Comments: Pt lives with wife who works during the day. Wife takes him to HD before going to work and then picks him up during her lunch hour and takes him home.      Prior Functioning/Environment Prior Level of Function : Independent/Modified Independent             Mobility Comments: Modified independent with rollator. ADLs Comments: Mod I with ADLs aside from assist with compression stockings. Has aide assist 1x/wk    OT Problem List: Decreased activity tolerance;Impaired  balance (sitting and/or standing);Decreased cognition;Decreased strength   OT Treatment/Interventions: Self-care/ADL training;Therapeutic exercise;Energy conservation;DME and/or AE instruction;Therapeutic activities;Patient/family education;Balance training      OT Goals(Current goals can be found in the care plan section)   Acute Rehab OT Goals Patient Stated Goal: hopeful for dizziness to improve, hopeful to go home soon OT Goal Formulation: With patient/family Time For Goal Achievement: 11/18/24 Potential to Achieve Goals: Good   OT Frequency:  Min 2X/week    Co-evaluation PT/OT/SLP Co-Evaluation/Treatment: Yes Reason for Co-Treatment: For patient/therapist safety PT goals addressed during session: Balance;Mobility/safety with mobility OT goals addressed during session: Strengthening/ROM      AM-PAC OT 6 Clicks Daily Activity     Outcome Measure Help from another person eating meals?: Total (NPO) Help from another person taking care of personal grooming?: A Little Help from another person toileting, which includes using toliet, bedpan, or urinal?: Total Help from another person bathing (including washing, rinsing, drying)?: A Lot Help from another person to put on and taking off regular upper body clothing?: A Little Help from another person to put on and taking off regular lower body clothing?: A Lot 6 Click Score: 12   End of Session Nurse Communication: Mobility status;Other (comment) (BP)  Activity Tolerance: Treatment limited secondary to medical complications (Comment) Patient left: in bed;with call bell/phone  within reach;with family/visitor present  OT Visit Diagnosis: Other abnormalities of gait and mobility (R26.89);Unsteadiness on feet (R26.81);Dizziness and giddiness (R42)                Time: 9155-9093 OT Time Calculation (min): 22 min Charges:  OT General Charges $OT Visit: 1 Visit OT Evaluation $OT Eval Moderate Complexity: 1 Mod  Mliss NOVAK,  OTR/L Acute Rehab Services Office: [phone]   Mliss Fish 11/04/2024, 10:46 AM

## 2024-11-04 NOTE — Progress Notes (Signed)
 Arrived to unit, VSS, no complaint of pain. Wife Grayce notified of patients arrival to 4NP01. She stated she will bring home CPAP, resp. notified. Also discussed wound care- she stated at home packing with guaze and vashe.

## 2024-11-04 NOTE — Consult Note (Signed)
 NEUROLOGY CONSULT NOTE   Date of service: November 04, 2024 Patient Name: Brendan [name]. MRN:  [id number] DOB:  1956-11-04 Chief Complaint: head injury, ?seizure Requesting Provider: Charlton Evalene RAMAN, MD  History of Present Illness  Brendan [name]. is a 68 y.o. male with hx of acanthosis nigricans, DM2, HLD, HTN, obesity, OSA, ESRD who reports that he went up 3 steps to get into his house from the garage. Clemens backwards on the 3rd step and hit his head on concrete. He slipped on the 3rd step. He was able to crawl into the house and was found face down in the laundry room by his wife in the evening.  He remembers the details of the event.  He was brought in to the ED. CT head with R frontal meningioma, posterior scalp and left prefrontal scalp hematomas along with bilateral frontal contusions.  When he initially presented, he was confused and somnolent.  There was concern for possible seizures and therefore neurology was consulted for further evaluation and workup.  Patient denies any prior history of seizures, no family history of seizures, he is not on any new medications.  He does not drink alcohol.  He denies any prior known history of strokes or ICH or brain surgery or infections of the brain.  He did not bite his tongue today, he denies any bladder or bowel incontinence.  He clearly remembers the event leading up to his fall and head injury.  Wife did not notice any seizure activity when she found him.  He has not had any seizures in the ED, no seizures were noted by EMS and route.   ROS  Comprehensive ROS performed and pertinent positives documented in HPI   Past History   Past Medical History:  Diagnosis Date   Acanthosis nigricans    Atopic dermatitis    CHF (congestive heart failure) (HCC)    CKD (chronic kidney disease) stage 4, GFR 15-29 ml/min (HCC) 01/08/2023   Diabetes (HCC) 11/19/2002   Erectile dysfunction    Fatty liver 11/19/2005   Glaucoma 11/19/2006    Gynecomastia 11/20/2007   Hx of adenomatous colonic polyps 08/26/2023   Hyperaldosteronism 11/19/1998   Hypercholesterolemia    Hyperlipidemia 2010   Hypertension 1999   Hypoglycemic reaction    Hypothyroidism 11/19/2002   Incontinence    Intermittent vertigo 11/19/2010   Left cervical radiculopathy 11/19/2010   Lumbar radiculopathy    Obesity    Peripheral neuropathy    Pollen allergies 11/19/2005   perennial   Prostate cancer (HCC) 11/19/2004   Reflux esophagitis 11/19/1993   Sickle cell trait 11/19/2004   Sleep apnea, obstructive 11/19/1998   uses a cpap   Venous insufficiency 11/20/2003   Vitamin D deficiency 11/19/2010    Past Surgical History:  Procedure Laterality Date   COLONOSCOPY  2006   normal   INCISION AND DRAINAGE OF WOUND N/A 08/28/2024   Procedure: IRRIGATION AND EXCISIONAL DEBRIDEMENT WOUND;  Surgeon: Lyndel Deward PARAS, MD;  Location: [facility] OR;  Service: General;  Laterality: N/A;  EXCISIONAL DEBRIDEMENT   INCISION AND DRAINAGE PERIRECTAL ABSCESS N/A 08/30/2024   Procedure: INCISION AND DRAINAGE OF PERINEAL WOUND;  Surgeon: Polly Cordella LABOR, MD;  Location: [facility] OR;  Service: General;  Laterality: N/A;   IR TUNNELED CENTRAL VENOUS CATH PLC W IMG  09/18/2024   IR TUNNELED CENTRAL VENOUS CATH PLC W IMG  09/22/2024   lap band surgery  2009   left inguinal hernia repair  1998   LIPOMA EXCISION  Left 04/17/2023   Procedure: MINOR EXCISION LEFT BUTTOCK SEBACEOUS CYST;  Surgeon: Lyndel Deward PARAS, MD;  Location:  [facility];  Service: General;  Laterality: Left;   robotic prostatectomy  2008    Family History: Family History  Problem Relation Age of Onset   Heart failure Mother    Hypertension Father        deceased age 73   Heart attack Father    COPD Sister    CVA Sister    Diabetes Daughter    Colon cancer Neg Hx    Stomach cancer Neg Hx    Colon polyps Neg Hx    Esophageal cancer Neg Hx    Rectal cancer Neg Hx     Social History   reports that he has never smoked. He has never used smokeless tobacco. He reports current alcohol use. He reports that he does not use drugs.  Allergies[1]  Medications  Current Medications[2]  Vitals   Vitals:   11/03/24 2045 11/03/24 2130 11/04/24 0014 11/04/24 0015  BP: (!) 145/61 131/64 (!) 147/85 (!) 145/75  Pulse: 78 67 72 72  Resp: (!) 21  13 12   Temp:   97.7 F (36.5 C)   TempSrc:   Oral   SpO2: 97% 98% 100% 100%  Weight:      Height:        Body mass index is 36.15 kg/m.   Physical Exam   General: Laying comfortably in bed; in no acute distress.  HENT: Normal oropharynx and mucosa. Normal external appearance of ears and nose.  Neck: Supple, no pain or tenderness  CV: No JVD. No peripheral edema.  Pulmonary: Symmetric Chest rise. Normal respiratory effort.  Abdomen: Soft to touch, non-tender.  Ext: No cyanosis, edema, or deformity  Skin: No rash. Normal palpation of skin.   Musculoskeletal: Normal digits and nails by inspection. No clubbing.   Neurologic Examination  Mental status/Cognition: Alert, oriented to self, place, month and year, good attention.  Speech/language: Fluent, comprehension intact, object naming intact, repetition intact.  Cranial nerves:   CN II Pupils equal and reactive to light, no VF deficits    CN III,IV,VI EOM intact, no gaze preference or deviation, no nystagmus    CN V normal sensation in V1, V2, and V3 segments bilaterally    CN VII no asymmetry, no nasolabial fold flattening    CN VIII normal hearing to speech    CN IX & X normal palatal elevation, no uvular deviation    CN XI 5/5 head turn and 5/5 shoulder shrug bilaterally    CN XII midline tongue protrusion    Motor:  Muscle bulk: normal, tone normal, pronator drift non tremor none Mvmt Root Nerve  Muscle Right Left Comments  SA C5/6 Ax Deltoid 5 5   EF C5/6 Mc Biceps 5 5   EE C6/7/8 Rad Triceps 5 5   WF C6/7 Med FCR     WE C7/8 PIN ECU     F Ab C8/T1 U ADM/FDI 5 5    HF L1/2/3 Fem Illopsoas 5 5   KE L2/3/4 Fem Quad 5 5   DF L4/5 D Peron Tib Ant 5 5   PF S1/2 Tibial Grc/Sol 5 5    Sensation:  Light touch Intact throughout   Pin prick    Temperature    Vibration   Proprioception    Coordination/Complex Motor:  - Finger to Nose intact bilaterally - Heel to shin intact bilaterally - Rapid alternating  movement are normal - Gait: Deferred for patient safety. Labs/Imaging/Neurodiagnostic studies   CBC:  Recent Labs  Lab 2024-11-21 2002 2024/11/21 2017  WBC 17.8*  --   HGB 10.8* 11.9*  HCT 32.9* 35.0*  MCV 90.9  --   PLT 219  --    Basic Metabolic Panel:  Lab Results  Component Value Date   NA 139 21-Nov-2024   K 3.6 November 21, 2024   CO2 25 2024/11/21   GLUCOSE 303 (H) 2024-11-21   BUN 14 November 21, 2024   CREATININE 2.40 (H) 11-21-24   CALCIUM 8.6 (L) 2024/11/21   GFRNONAA 29 (L) 11/21/24   GFRAA 29 (L) 08/25/2020   Lipid Panel: No results found for: LDLCALC HgbA1c:  Lab Results  Component Value Date   HGBA1C 8.4 (H) 08/29/2024   Urine Drug Screen:     Component Value Date/Time   LABOPIA NEGATIVE Nov 21, 2024 2231   COCAINSCRNUR NEGATIVE 2024-11-21 2231   LABBENZ NEGATIVE 21-Nov-2024 2231   AMPHETMU NEGATIVE November 21, 2024 2231   THCU NEGATIVE November 21, 2024 2231   LABBARB NEGATIVE November 21, 2024 2231    Alcohol Level     Component Value Date/Time   ETH <15 2024/11/21 2002   INR  Lab Results  Component Value Date   INR 1.0 08/28/2024   APTT No results found for: APTT AED levels: No results found for: PHENYTOIN, ZONISAMIDE, LAMOTRIGINE, LEVETIRACETA  CT Head without contrast(Personally reviewed): R frontal meningioma, posterior scalp and left prefrontal scalp hematomas along with bilateral frontal contusions.  MRI Brain(Personally reviewed): Pending  Neurodiagnostics rEEG:  Pending  ASSESSMENT   Brendan [name]. is a 68 y.o. male with hx of acanthosis nigricans, DM2, HLD, HTN, obesity, OSA, ESRD who slipped on  garage stairs and fell backwards and hit his head, was able to crawl into the house and found face down by his wife.  There was initial concern for seizure and neurology was consulted.  No seizure risk factors identified on history.  No prior history of seizures.  He clearly remembers the events leading up to the fall and after.  He did not bite his tongue, no loss of bladder or bowel. I do not think he had a seizure today.  The noted R frontal meningioma appears calcified, is likely chronic with no noted mass effect on underlying R frontal lobe. My overall suspicion for this being a focus for possible seizures, is pretty low.  RECOMMENDATIONS  - No long term AEDs indicated at this time. - Can use Keppra  500mg  daily(ESRD dosing) for 1 week for prophylaxis for early seizures. I would not continue beyond that timeframe. - management of TBI otherwise, per Neurosurgery and primary team. -Neurology inpatient team will sign off.  Please feel free to contact us  with any questions or concerns. ______________________________________________________________________  Plan discussed with Dr. Charlton with the Hospitalist team.   Signed, Ebony Rickel, MD [facility]    [1]  Allergies Allergen Reactions   Ace Inhibitors Cough   Maxidex  [Dexamethasone ] Other (See Comments)    Sexual dysfunction   Verapamil Other (See Comments)    constipation  [2]  Current Facility-Administered Medications:    0.9 %  sodium chloride  infusion, 250 mL, Intravenous, PRN, Opyd, Timothy S, MD   acetaminophen  (TYLENOL ) tablet 650 mg, 650 mg, Oral, Q4H PRN, 650 mg at 11/04/24 0026 **OR** acetaminophen  (TYLENOL ) 160 MG/5ML solution 650 mg, 650 mg, Per Tube, Q4H PRN **OR** acetaminophen  (TYLENOL ) suppository 650 mg, 650 mg, Rectal, Q4H PRN, Opyd, Evalene RAMAN, MD   amLODipine  (NORVASC ) tablet 5  mg, 5 mg, Oral, Daily, Opyd, Timothy S, MD   carvedilol  (COREG ) tablet 12.5 mg, 12.5 mg, Oral, BID WC, Opyd, Timothy S,  MD   fentaNYL  (SUBLIMAZE ) injection 12.5-50 mcg, 12.5-50 mcg, Intravenous, Q2H PRN, Opyd, Timothy S, MD   insulin  aspart (novoLOG ) injection 0-6 Units, 0-6 Units, Subcutaneous, Q4H, Opyd, Timothy S, MD, 3 Units at 11/04/24 0023   labetalol  (NORMODYNE ) injection 10 mg, 10 mg, Intravenous, Q2H PRN, Opyd, Timothy S, MD   levothyroxine  (SYNTHROID ) tablet 125 mcg, 125 mcg, Oral, q AM, Opyd, Timothy S, MD   prochlorperazine  (COMPAZINE ) injection 5 mg, 5 mg, Intravenous, Q6H PRN, Opyd, Timothy S, MD   senna (SENOKOT) tablet 8.6 mg, 1 tablet, Oral, Daily PRN, Opyd, Evalene RAMAN, MD   simvastatin  (ZOCOR ) tablet 20 mg, 20 mg, Oral, QPM, Opyd, Timothy S, MD   sodium chloride  flush (NS) 0.9 % injection 3 mL, 3 mL, Intravenous, Q12H, Opyd, Timothy S, MD   sodium chloride  flush (NS) 0.9 % injection 3 mL, 3 mL, Intravenous, PRN, Opyd, Timothy S, MD  Current Outpatient Medications:    acetaminophen  (TYLENOL ) 500 MG tablet, Take 500 mg by mouth every 6 (six) hours as needed for mild pain., Disp: , Rfl:    amLODipine  (NORVASC ) 5 MG tablet, Take 1 tablet (5 mg total) by mouth daily., Disp: 90 tablet, Rfl: 3   carvedilol  (COREG ) 12.5 MG tablet, Take 1 tablet (12.5 mg total) by mouth 2 (two) times daily with a meal. Follow your blood pressure outpatient and get refills and adjustments with your PCP outpatient, Disp: 60 tablet, Rfl: 0   clobetasol  ointment (TEMOVATE ) 0.05 %, Apply topically daily before sleeping. (Patient not taking: Reported on 08/29/2024), Disp: 60 g, Rfl: 1   Continuous Glucose Sensor (FREESTYLE LIBRE 3 PLUS SENSOR) MISC, Apply to upper back of arm. Change every 15 days., Disp: 2 each, Rfl: 12   Continuous Glucose Sensor (FREESTYLE LIBRE 3 SENSOR) MISC, Apply to upper back of arm and change ever 14 days., Disp: 6 each, Rfl: 3   dorzolamide  (TRUSOPT ) 2 % ophthalmic solution, Place 1 drop into both eyes daily., Disp: , Rfl:    hydrALAZINE  (APRESOLINE ) 100 MG tablet, Take 1/2 tablet (50 mg total) by mouth  2 (two) times daily., Disp: 90 tablet, Rfl: 3   insulin  glargine (LANTUS  SOLOSTAR) 100 UNIT/ML Solostar Pen, Inject 15 Units into the skin daily. Follow up with your PCP outpatient for further adjustment to your regimen, Disp: , Rfl:    insulin  lispro (HUMALOG  KWIKPEN) 200 UNIT/ML KwikPen, Inject 0-15 Units into the skin 3 (three) times daily with meals. CBG < 70: treat low blood sugar CBG 70 - 120: 0 units  CBG 121 - 150: 2 units  CBG 151 - 200: 3 units  CBG 201 - 250: 5 units  CBG 251 - 300: 8 units  CBG 301 - 350: 11 units  CBG 351 - 400: 15 units  CBG >400: Call MD, Disp: , Rfl:    insulin  lispro (HUMALOG  KWIKPEN) 200 UNIT/ML KwikPen, Inject 0-15 Units into the skin 3 (three) times daily with meals., Disp: 45 mL, Rfl: 3   Insulin  Pen Needle (TECHLITE PEN NEEDLES) 32G X 4 MM MISC, Use to inject insulin  4 times daily, Disp: 400 each, Rfl: 4   Insulin  Pen Needle 32G X 4 MM MISC, Use to inject insulin  up to 4 times daily., Disp: 400 each, Rfl: 4   levothyroxine  (SYNTHROID ) 125 MCG tablet, Take 1 tablet (125 mcg total) by mouth every  morning on an empty stomach, Disp: 30 tablet, Rfl: 11   simvastatin  (ZOCOR ) 20 MG tablet, Take 1 tablet (20 mg total) by mouth every evening., Disp: 90 tablet, Rfl: 3   timolol  (BETIMOL ) 0.5 % ophthalmic solution, Place 1 drop into both eyes daily., Disp: , Rfl:    tirzepatide  (MOUNJARO ) 15 MG/0.5ML Pen, Inject 15 mg into the skin once a week., Disp: 6 mL, Rfl: 4   torsemide  (DEMADEX ) 100 MG tablet, Take 1 tablet (100 mg total) by mouth in the morning except on dialysis days, Disp: 30 tablet, Rfl: 3

## 2024-11-04 NOTE — Progress Notes (Signed)
 Inpatient Rehab Admissions Coordinator:   Per therapy recommendations pt was screened for CIR by Reche Lowers, PT, DPT.  Note admitted with fall and TBI.  Evaluations limited by orthostatic BP in sitting.  Will rescreen after next therapy session for progress.   Reche Lowers, PT, DPT Admissions Coordinator [phone] 11/04/2024 1:17 PM

## 2024-11-04 NOTE — Progress Notes (Addendum)
 PROGRESS NOTE    [name]  FMW:[id number] DOB: 1956/02/23 DOA: 11/03/2024 PCP: Rexanne Ingle, MD  Subjective: Patient mostly sleepy but able to answer simple questions. He reports feeling dizzy still, even while laying down. Per family at bedside, he has been sleeping a lot     Hospital Course: 68 year old male with PMH of DM2, HTN, HLD, poor thyroidism, OSA on CPAP, Fournier's gangrene (October 2025), CKD stage V recently started on HD, who presented to the ED after he was found unconscious on the floor.  Per patient he had lost his balance and fell down 3 steps and hit the back of his head after he returned home from dialysis.  CT head showed right frontal extra-axial mass, most consistent meningioma, posterior scalp and left prefrontal scalp hematomas.  Neurosurgery and neurology were consulted.   Assessment and Plan:  1. Fall with TBI;  - Found down at home with LOC - Found on CT to have mild contusion along inferior frontal lobes and right frontal meningioma. Repeat CT head showed stable acute hemorrhage along right inferior frontal lobe, stable acute subarachnoid hemorrhage overlying right frontal lobe, and scalp hematomas.  MRI brain showed small hemorrhagic contusions, trace regional subarachnoid hemorrhage, trace intraventricular hemorrhage, large posterior scalp contusion and the meningioma - Appreciate neurology and neurosurgery assessment and recommendations  - started on keppra  for seizure prophylaxis, renally dosed for 1 week.  Neurology did not recommend to continue AEDs after - PT/OT/SLP eval   3. ESRD  - Completed HD on day of admission  - Renally-dose medications, restrict fluids   - due for HD tomorrow   3. Type II DM  - A1c was 8.4% in October 2025  - Check CBGs and use low-intensity SSI     4. Hypertension  - BP has been on the lower side now - holding amlodipine  and carvedilol , resume as BP improves   5. HLD  - Continue simvastatin      6.  Hypothyroidism  - Levothyroxine      7. OSA  - CPAP while sleeping      DVT prophylaxis: SCD's Start: 11/03/24 2248    Code Status: Full Code Family Communication: Updated at bedside  Disposition Plan: TBD Reason for continuing need for hospitalization: Not med ready   Objective: Vitals:   11/04/24 0845 11/04/24 0912 11/04/24 1000 11/04/24 1030  BP: 128/63  107/61 (!) 90/49  Pulse: (!) 57  61 (!) 58  Resp: 15  14 16   Temp:  (!) 97.5 F (36.4 C)    TempSrc:  Oral    SpO2: 100%  100% 100%  Weight:      Height:       No intake or output data in the 24 hours ending 11/04/24 1412 Filed Weights   11/03/24 2002  Weight: 127.7 kg    Examination:  Physical Exam Vitals and nursing note reviewed.  Constitutional:      General: He is not in acute distress. Cardiovascular:     Rate and Rhythm: Normal rate.  Abdominal:     General: There is no distension.     Tenderness: There is no abdominal tenderness.  Musculoskeletal:     Right lower leg: No edema.     Left lower leg: No edema.  Neurological:     Comments: Sleepy but wakes up easily to verbal stimuli, and answers simple questions appropriately      Data Reviewed: I have personally reviewed following labs and imaging studies  CBC: Recent Labs  Lab 11/03/24 2002 11/03/24 2017 11/04/24 0223  WBC 17.8*  --  13.9*  HGB 10.8* 11.9* 10.6*  HCT 32.9* 35.0* 32.8*  MCV 90.9  --  90.1  PLT 219  --  232   Basic Metabolic Panel: Recent Labs  Lab 11/03/24 2002 11/03/24 2017 11/04/24 0223  NA 139 139 139  K 3.7 3.6 3.4*  CL 101 102 102  CO2 25  --  25  GLUCOSE 295* 303* 284*  BUN 13 14 15   CREATININE 2.35* 2.40* 2.51*  CALCIUM 8.6*  --  8.5*   GFR: Estimated Creatinine Clearance: 39.4 mL/min (A) (by C-G formula based on SCr of 2.51 mg/dL (H)). Liver Function Tests: Recent Labs  Lab 11/03/24 2002  AST 30  ALT 16  ALKPHOS 108  BILITOT 0.7  PROT 7.2  ALBUMIN  3.5   No results for input(s): LIPASE,  AMYLASE in the last 168 hours. No results for input(s): AMMONIA in the last 168 hours. Coagulation Profile: No results for input(s): INR, PROTIME in the last 168 hours. Cardiac Enzymes: Recent Labs  Lab 11/03/24 2002  CKTOTAL 107   ProBNP, BNP (last 5 results) Recent Labs    01/20/24 1042 08/28/24 1613 09/15/24 1846  PROBNP  --  6,038.0*  --   BNP 295.6*  --  728.9*   HbA1C: No results for input(s): HGBA1C in the last 72 hours. CBG: Recent Labs  Lab 11/03/24 1951 11/04/24 0001 11/04/24 0459 11/04/24 0734 11/04/24 1149  GLUCAP 261* 268* 245* 238* 207*   Lipid Profile: No results for input(s): CHOL, HDL, LDLCALC, TRIG, CHOLHDL, LDLDIRECT in the last 72 hours. Thyroid  Function Tests: No results for input(s): TSH, T4TOTAL, FREET4, T3FREE, THYROIDAB in the last 72 hours. Anemia Panel: No results for input(s): VITAMINB12, FOLATE, FERRITIN, TIBC, IRON , RETICCTPCT in the last 72 hours. Sepsis Labs: Recent Labs  Lab 11/03/24 2017  LATICACIDVEN 1.6    No results found for this or any previous visit (from the past 240 hours).   Radiology Studies: CT Head Wo Contrast Result Date: 11/04/2024 EXAM: CT HEAD WITHOUT CONTRAST 11/04/2024 04:19:35 AM TECHNIQUE: CT of the head was performed without the administration of intravenous contrast. Automated exposure control, iterative reconstruction, and/or weight based adjustment of the mA/kV was utilized to reduce the radiation dose to as low as reasonably achievable. COMPARISON: CT of the head and MRI of the head dated 11/03/2024. CLINICAL HISTORY: head trauma, 8 hour scan. FINDINGS: BRAIN AND VENTRICLES: Focal hyperdensity within right pons, unchanged. Stable acute hemorrhage along right inferior frontal lobe. Stable acute subarachnoid hemorrhage along right frontal lobe. Right frontal convexity extra-axial mass lesion measuring 3.1 x 2.1 x 2.6 cm, likely meningioma. Stable associated local  cortical mass effect. Moderate patchy and ill-defined hypoattenuation within cerebral white matter, compatible with chronic small vessel ischemic disease. Persistent pneumocephalus. No evidence of acute infarct. No hydrocephalus. No midline shift. Moderate atherosclerotic calcifications. ORBITS: No acute abnormality. SINUSES: Mucosal thickening within bilateral maxillary sinuses. SOFT TISSUES AND SKULL: Left frontal and left parietooccipital scalp hematomas. No skull fracture. IMPRESSION: 1. Stable acute hemorrhage along right inferior frontal lobe and stable acute subarachnoid hemorrhage along right frontal lobe. 2. Right frontal convexity extra-axial mass lesion, likely meningioma, with stable associated local cortical mass effect. 3. Moderate patchy and ill-defined hypoattenuation within cerebral white matter, compatible with chronic small vessel ischemic disease. 4. Left frontal and left parietooccipital scalp hematomas. Electronically signed by: Evalene Coho MD 11/04/2024 05:02 AM EST RP Workstation: [id number]   MR Brain W and  Wo Contrast Result Date: 11/04/2024 EXAM: MRI BRAIN WITH AND WITHOUT CONTRAST 11/03/2024 11:27:47 PM TECHNIQUE: Multiplanar multisequence MRI of the head/brain was performed with and without the administration of 10 mL Gadobutrol  1 mmol/mL IV solution. COMPARISON: CT from earlier the same day. CLINICAL HISTORY: fall, head injury, dizziness FINDINGS: LIMITATIONS/ARTIFACTS: Examination moderately to severely degraded by motion artifact. BRAIN AND VENTRICLES: No acute infarct. Small hemorrhagic contusions involving the right greater than left gyrus recti are seen. The largest hematoma on the right measures up to 2.1 cm (series 5, image 12). Mild localized edema without significant regional mass effect or midline shift. Trace regional subarachnoid hemorrhage in this region. Probable trace extra axial hemorrhage along the anterior falx. Trace intraventricular hemorrhage with blood  seen layering within the occipital horns noted as well (series 7, images 50, 42). Patchy T2/FLAIR hyperintensity involving the periventricular, deep, and subcortical white matter of the cerebral hemispheres, nonspecific, but most commonly related to chronic small vessel ischemic disease. Changes are moderate in nature. Mild patchy involvement of the pons. Enhancing extra-axial mass overlying the anterior right frontal convexity measures 4.0 x 2.3 x 4.3 cm (AP by craniocaudad by transverse), most characteristics of a meningioma. Lesion abuts but does not definitively invade the superior sagittal sinus anteriorly. No significant associated vasogenic edema or regional mass effect. No hydrocephalus. The sella is unremarkable. Normal flow voids. ORBITS: No acute abnormality. SINUSES: Scattered mucosal thickening seen throughout the paranasal sinuses. BONES AND SOFT TISSUES: Normal bone marrow signal and enhancement. Large posterior scalp contusion, with additional smaller scalp contusion at the left forehead. Small left greater than right mastoid effusions, of definite significance. Imaged nasopharynx unremarkable. IMPRESSION: 1. Small hemorrhagic contusions involving the right greater than left gyrus [email or web address] localized edema without significant regional mass effect or midline shift. 2. Trace regional subarachnoid hemorrhage at the anterior frontal lobes bilaterally, with trace intraventricular hemorrhage within the occipital horns of both lateral ventricles. No hydrocephalus. 3. Large posterior scalp contusion, with additional smaller scalp contusion at the left forehead. 4. Enhancing 4.0 x 2.3 x 4.3 cm extra-axial mass overlying the anterior right frontal convexity, most characteristic of a meningioma. No significant surrounding vasogenic edema or regional mass effect. Electronically signed by: Morene Hoard MD 11/04/2024 12:50 AM EST RP Workstation: [id number]   DG Pelvis Portable Result Date:  11/03/2024 EXAM: 1 or 2 VIEW(S) XRAY OF THE PELVIS 11/03/2024 09:12:00 PM COMPARISON: None available. CLINICAL HISTORY: Trauma FINDINGS: LIMITATIONS/ARTIFACTS: Marked underpenetration, particularly at the pelvis and hips. BONES AND JOINTS: No acute fracture. No malalignment. SOFT TISSUES: The soft tissues are unremarkable. IMPRESSION: 1. No evidence of acute traumatic injury. 2. Marked underpenetration, particularly at the pelvis and hips. Electronically signed by: Oneil Devonshire MD 11/03/2024 09:20 PM EST RP Workstation: [id number]   DG Chest Port 1 View Result Date: 11/03/2024 EXAM: 1 VIEW(S) XRAY OF THE CHEST 11/03/2024 09:12:00 PM COMPARISON: 09/15/2024 CLINICAL HISTORY: Trauma FINDINGS: LINES, TUBES AND DEVICES: Tunneled right IJ CVC in place with tip projecting over the superior cavoatrial junction. LUNGS AND PLEURA: Low lung volumes accentuate pulmonary vascularity. No focal consolidation. No pleural effusion. No pneumothorax. HEART AND MEDIASTINUM: Stable cardiomegaly. BONES AND SOFT TISSUES: No acute osseous abnormality. IMPRESSION: 1. No acute process. Electronically signed by: Oneil Devonshire MD 11/03/2024 09:19 PM EST RP Workstation: [id number]   CT Head Wo Contrast Addendum Date: 11/03/2024 ** ADDENDUM #1 ** ADDENDUM: Additionally there is increased attenuation noted along the inferior aspect of the frontal lobes bilaterally, consistent with mild contusions. This is commensurate with the  patient's given injury. Findings were called to Dr. Yolande at the time of exam interpretation. ---------------------------------------------------- Electronically signed by: Oneil Devonshire MD 11/03/2024 09:05 PM EST RP Workstation: [id number]   Result Date: 11/03/2024 ** ORIGINAL REPORT ** EXAM: CT HEAD WITHOUT CONTRAST 11/03/2024 08:34:10 PM TECHNIQUE: CT of the head was performed without the administration of intravenous contrast. Automated exposure control, iterative reconstruction, and/or weight based adjustment  of the mA/kV was utilized to reduce the radiation dose to as low as reasonably achievable. COMPARISON: None available. CLINICAL HISTORY: found down, ams FINDINGS: BRAIN AND VENTRICLES: Tiny focus of increased attenuation is noted in the midportion of the pons, consistent with focal calcification. No other focal hemorrhage is noted. No evidence of acute infarct. 2.6 x 1.4 x 2.8 cm right frontal convexity extra-axial mass most consistent with meningioma. Moderate chronic microvascular ischemic change. ORBITS: No acute abnormality. SINUSES: Mucosal thickening in the maxillary and bilateral ethmoid sinuses. SOFT TISSUES AND SKULL: Midline posterior scalp and left prefrontal scalp hematomas. No skull fracture. IMPRESSION: 1. 2.6 x 1.4 x 2.8 cm right frontal convexity extra-axial mass most consistent with meningioma. 2. Midline posterior scalp and left prefrontal scalp hematomas. 3. Tiny focus of increased attenuation in the midportion of the pons, likely related to calcification. Electronically signed by: Oneil Devonshire MD 11/03/2024 08:56 PM EST RP Workstation: [id number]   CT Cervical Spine Wo Contrast Result Date: 11/03/2024 EXAM: CT CERVICAL SPINE WITHOUT CONTRAST 11/03/2024 08:34:10 PM TECHNIQUE: CT of the cervical spine was performed without the administration of intravenous contrast. Multiplanar reformatted images are provided for review. Automated exposure control, iterative reconstruction, and/or weight based adjustment of the mA/kV was utilized to reduce the radiation dose to as low as reasonably achievable. COMPARISON: None available. CLINICAL HISTORY: Neck trauma (Age >= 65y) FINDINGS: BONES AND ALIGNMENT: 7 cervical segments are well visualized. Vertebral body height is well maintained. No acute fracture or acute facet abnormality is noted. DEGENERATIVE CHANGES: Osteophytic changes and facet hypertrophic changes are seen. SOFT TISSUES: Surrounding soft tissue structures are within normal limits. LUNG  APICES: Apices are within normal limits. IMPRESSION: 1. Multilevel degenerative change without acute abnormality. Electronically signed by: Oneil Devonshire MD 11/03/2024 08:58 PM EST RP Workstation: [id number]    Scheduled Meds:  Chlorhexidine  Gluconate Cloth  6 each Topical Daily   insulin  aspart  0-6 Units Subcutaneous Q4H   levETIRAcetam   500 mg Oral Daily   levothyroxine   125 mcg Oral q AM   simvastatin   20 mg Oral QPM   sodium chloride  flush  3 mL Intravenous Q12H   Continuous Infusions:  sodium chloride        LOS: 0 days   Time spent: 40 minutes  Casimer Dare, MD  [facility]  11/04/2024, 2:12 PM

## 2024-11-04 NOTE — Evaluation (Signed)
 Physical Therapy Evaluation Patient Details Name: Brendan [name]. MRN: [id number] DOB: [birth year] Today's Date: 11/04/2024  History of Present Illness  Pt is 68 year old presented to [facility] on  11/03/24 for fall with head trauma. MRI showed large scalp contusion, trace regional subarachnoid hemorrhage anterior frontal lobes, and small hemorrhagic contusions involving the rt greater than lt gyrus recti. PMH - ESRD on HD, necrotizing fasciitis, DM2, HTN, hypothyroidism, OSA, obesity, CKD 4, chronic diastolic CHF, glaucoma, gynecomastia, HLD, vertigo, obesity, peripheral neuropathy, prostate cancer, sickle cell trait  Clinical Impression  Pt admitted with above diagnosis and presents to PT with functional limitations due to deficits listed below (See PT problem list). Pt needs skilled PT to maximize independence and safety. Pt lives with wife who works during the day. Typically pt is modified independent at home with rollator. Currently unable to progress past EOB due to light headed/dizzy. Pt was significantly orthostatic. Suspect head injury also contributing to dizziness. Patient will benefit from intensive inpatient follow-up therapy, >3 hours/day.     Orthostatic BPs  Supine 128/63  Sitting 65/45  Supine 93/48  Supine 74/52         If plan is discharge home, recommend the following: A lot of help with walking and/or transfers;A lot of help with bathing/dressing/bathroom;Assistance with cooking/housework;Assist for transportation;Help with stairs or ramp for entrance   Can travel by private vehicle        Equipment Recommendations Other (comment) (To be determined)  Recommendations for Other Services       Functional Status Assessment Patient has had a recent decline in their functional status and demonstrates the ability to make significant improvements in function in a reasonable and predictable amount of time.     Precautions / Restrictions Precautions Precautions: Fall;Other  (comment) Precaution/Restrictions Comments: watch BP Restrictions Weight Bearing Restrictions Per Provider Order: No      Mobility  Bed Mobility Overal bed mobility: Needs Assistance Bed Mobility: Supine to Sit, Sit to Supine, Rolling Rolling: Min assist   Supine to sit: Mod assist Sit to supine: Mod assist   General bed mobility comments: Assist to elevate trunk into sitting. Assist to bring legs back up onto stretcher    Transfers                   General transfer comment: Unable to attempt due to orthostatic in sitting    Ambulation/Gait                  Stairs            Wheelchair Mobility     Tilt Bed    Modified Rankin (Stroke Patients Only)       Balance Overall balance assessment: Needs assistance Sitting-balance support: No upper extremity supported, Feet supported Sitting balance-Leahy Scale: Fair                                       Pertinent Vitals/Pain Pain Assessment Pain Assessment: Faces Faces Pain Scale: Hurts little more Pain Location: head Pain Descriptors / Indicators: Aching, Grimacing Pain Intervention(s): Limited activity within patient's tolerance, Monitored during session, Repositioned    Home Living Family/patient expects to be discharged to:: Private residence Living Arrangements: Spouse/significant other Available Help at Discharge: Family;Available PRN/intermittently Type of Home: House Home Access: Stairs to enter Entrance Stairs-Rails: Right;Left;Can reach both Entrance Stairs-Number of Steps: 4   Home Layout: One  level Home Equipment: Cane - single point;Hand held Programmer, Systems (2 wheels);BSC/3in1;Rollator (4 wheels) Additional Comments: Pt lives with wife who works during the day. Wife takes him to HD before going to work and then picks him up during her lunch hour and takes him home.    Prior Function Prior Level of Function : Independent/Modified Independent              Mobility Comments: Modified independent with rollator.       Extremity/Trunk Assessment   Upper Extremity Assessment Upper Extremity Assessment: Defer to OT evaluation    Lower Extremity Assessment Lower Extremity Assessment: Generalized weakness       Communication   Communication Communication: No apparent difficulties    Cognition Arousal: Alert Behavior During Therapy: Flat affect   PT - Cognitive impairments: Problem solving                       PT - Cognition Comments: slow processing Following commands: Impaired Following commands impaired: Follows one step commands with increased time     Cueing Cueing Techniques: Verbal cues, Tactile cues     General Comments General comments (skin integrity, edema, etc.): Pt orthostatic in sitting and unable to attempt standing.    Exercises     Assessment/Plan    PT Assessment Patient needs continued PT services  PT Problem List Decreased strength;Decreased activity tolerance;Decreased balance;Decreased mobility;Cardiopulmonary status limiting activity       PT Treatment Interventions DME instruction;Gait training;Stair training;Functional mobility training;Therapeutic activities;Therapeutic exercise;Balance training;Patient/family education    PT Goals (Current goals can be found in the Care Plan section)  Acute Rehab PT Goals Patient Stated Goal: return home PT Goal Formulation: With patient/family Time For Goal Achievement: 11/18/24 Potential to Achieve Goals: Good    Frequency Min 2X/week     Co-evaluation PT/OT/SLP Co-Evaluation/Treatment: Yes Reason for Co-Treatment: For patient/therapist safety PT goals addressed during session: Balance;Mobility/safety with mobility         AM-PAC PT 6 Clicks Mobility  Outcome Measure Help needed turning from your back to your side while in a flat bed without using bedrails?: A Little Help needed moving from lying on your back to sitting  on the side of a flat bed without using bedrails?: A Lot Help needed moving to and from a bed to a chair (including a wheelchair)?: Total Help needed standing up from a chair using your arms (e.g., wheelchair or bedside chair)?: Total Help needed to walk in hospital room?: Total Help needed climbing 3-5 steps with a railing? : Total 6 Click Score: 9    End of Session   Activity Tolerance: Treatment limited secondary to medical complications (Comment) (orthostatic) Patient left: in bed;with call bell/phone within reach;with nursing/sitter in room Nurse Communication: Mobility status;Other (comment) (low BP) PT Visit Diagnosis: Other abnormalities of gait and mobility (R26.89);Muscle weakness (generalized) (M62.81);Pain;Dizziness and giddiness (R42) Pain - part of body:  (head)    Time: 9153-9089 PT Time Calculation (min) (ACUTE ONLY): 24 min   Charges:   PT Evaluation $PT Eval Moderate Complexity: 1 Mod   PT General Charges $$ ACUTE PT VISIT: 1 Visit         [facility] PT Acute Rehabilitation Services Office [phone]  [place] ORN [facility] 11/04/2024, 10:22 AM

## 2024-11-04 NOTE — Progress Notes (Signed)
 SLP Cancellation Note  Patient Details Name: Brendan [name]. MRN: [id number] DOB: 05-29-1956   Cancelled treatment:       Reason Eval/Treat Not Completed: Other (comment) Visited pt in ED pt about to transfer. Will f/u for cognitive eval   Almon Whitford, Consuelo Fitch 11/04/2024, 11:39 AM

## 2024-11-04 NOTE — Progress Notes (Signed)
 Patient ID: Brendan Swails., male   DOB: Apr 12, 1956, 68 y.o.   MRN: [id number] Alert and oriented x 4. On his Cpap currently.  Reviewed CT and MRI. Has contusion right frontal lobe. There are no worrisome masses, or intracranial bleeding. Meningioma seen on Mri, consistent with CT. This will not be resected, as there is absolutely no need. Looks much better today v. Yesterday. No new recommendations at this time.  Will continue to follow.

## 2024-11-04 NOTE — ED Notes (Signed)
MD notified of BP, awaiting orders.

## 2024-11-04 NOTE — Consult Note (Signed)
 Please note that the [facility] nursing team is utilizing a standardized work plan to manage patient consults. We are triaging consults and will try to see the patients within 24 hours. Wound photos in the patient's chart allow us  to consult on the patient in the most efficient and timely manner.    Thank you,  Doyal Polite, MSN, RN, [facility] WOC Team [phone] (Available Mon-Fri 0700-1500)

## 2024-11-04 NOTE — ED Notes (Signed)
 PT/OT at bedside.

## 2024-11-04 NOTE — Consult Note (Signed)
 Renal Service Consult Note [facility] Lamar JONETTA Fret, MD  Patient: Brendan [name]. Date: 11/04/2024 Requesting Physician: Dr. Caleen, A.   Reason for Consult: ESRD pt recently started admitted after a fall HPI: The patient is a 68 y.o. year-old w/ PMH as below who presented to ED on 12/16 brought by EMS from home after his wife found him unconscious on the floor with a large hematoma in the back of the head.  Also was weak on his right side.  In the ED he started vomiting.  He did go to dialysis yesterday morning and was seen normal back home at 2:30 PM.  In ED BP was 150/80, HR 78, RR 21, temp 97, 98% sat on room air.  Labs showed K+ 3.6, BUN 14, creatinine 2.4, calcium 8.6, albumin  3.5 Hgb 11, WBC 17K.  UA showed small hemoglobin and LE, rare bacteria, 11-20 WBCs.  Urine tox screen was negative, alcohol less than 15.  Head CT was concerning for mild contusions along the inferior aspect of the both frontal lobes and the right frontal convexity Mast most consistent with meningioma.  Neurosurgery and neurology were consulted and patient was given IV Keppra  load, Benadryl , Zofran , and Reglan  were given also.  Patient was admitted for fall with TBI, questionable CVA.  Patient was admitted.  We are asked to see for renal failure.   Pt seen in room.  He was recently started on dialysis in early November 2025 when he was admitted and had significant severe volume overload which did not respond to diuresis.  He was discharged and has been getting dialysis at [facility] on a MTTS schedule.  Patient states he is here for a fall.  He does not know anything about his kidney problems.  He has no complaints, no shortness of breath, no leg swelling no chest pain.  Dialysis has not been a problem.   ROS - denies CP, no joint pain, no HA, no blurry vision, no rash, no diarrhea, no nausea/ vomiting   Past Medical History  Past Medical History:  Diagnosis Date   Acanthosis nigricans    Atopic  dermatitis    CHF (congestive heart failure) (HCC)    CKD (chronic kidney disease) stage 4, GFR 15-29 ml/min (HCC) 01/08/2023   Diabetes (HCC) 11/19/2002   Erectile dysfunction    Fatty liver 11/19/2005   Glaucoma 11/19/2006   Gynecomastia 11/20/2007   Hx of adenomatous colonic polyps 08/26/2023   Hyperaldosteronism 11/19/1998   Hypercholesterolemia    Hyperlipidemia 2010   Hypertension 1999   Hypoglycemic reaction    Hypothyroidism 11/19/2002   Incontinence    Intermittent vertigo 11/19/2010   Left cervical radiculopathy 11/19/2010   Lumbar radiculopathy    Obesity    Peripheral neuropathy    Pollen allergies 11/19/2005   perennial   Prostate cancer (HCC) 11/19/2004   Reflux esophagitis 11/19/1993   Sickle cell trait 11/19/2004   Sleep apnea, obstructive 11/19/1998   uses a cpap   Venous insufficiency 11/20/2003   Vitamin D deficiency 11/19/2010   Past Surgical History  Past Surgical History:  Procedure Laterality Date   COLONOSCOPY  2006   normal   INCISION AND DRAINAGE OF WOUND N/A 08/28/2024   Procedure: IRRIGATION AND EXCISIONAL DEBRIDEMENT WOUND;  Surgeon: Lyndel Deward PARAS, MD;  Location: [facility] OR;  Service: General;  Laterality: N/A;  EXCISIONAL DEBRIDEMENT   INCISION AND DRAINAGE PERIRECTAL ABSCESS N/A 08/30/2024   Procedure: INCISION AND DRAINAGE OF PERINEAL WOUND;  Surgeon: Polly Hacker  A, MD;  Location: [facility] OR;  Service: General;  Laterality: N/A;   IR TUNNELED CENTRAL VENOUS CATH PLC W IMG  09/18/2024   IR TUNNELED CENTRAL VENOUS CATH PLC W IMG  09/22/2024   lap band surgery  2009   left inguinal hernia repair  1998   LIPOMA EXCISION Left 04/17/2023   Procedure: MINOR EXCISION LEFT BUTTOCK SEBACEOUS CYST;  Surgeon: Lyndel Deward PARAS, MD;  Location: [facility];  Service: General;  Laterality: Left;   robotic prostatectomy  2008   Family History  Family History  Problem Relation Age of Onset   Heart failure Mother    Hypertension  Father        deceased age 49   Heart attack Father    COPD Sister    CVA Sister    Diabetes Daughter    Colon cancer Neg Hx    Stomach cancer Neg Hx    Colon polyps Neg Hx    Esophageal cancer Neg Hx    Rectal cancer Neg Hx    Social History  reports that he has never smoked. He has never used smokeless tobacco. He reports current alcohol use. He reports that he does not use drugs. Allergies Allergies[1] Home medications Prior to Admission medications  Medication Sig Start Date End Date Taking? Authorizing Provider  acetaminophen  (TYLENOL ) 500 MG tablet Take 500 mg by mouth every 6 (six) hours as needed for mild pain.   Yes [provider]  amLODipine  (NORVASC ) 5 MG tablet Take 1 tablet (5 mg total) by mouth daily. 10/21/24  Yes   bimekizumab-bkzx (BIMZELX) 160 MG/ML prefilled syringe Inject 1 Syringe into the skin every 30 (thirty) days. 08/24/24  Yes [provider]  carvedilol  (COREG ) 12.5 MG tablet Take 1 tablet (12.5 mg total) by mouth 2 (two) times daily with a meal. Follow your blood pressure outpatient and get refills and adjustments with your PCP outpatient 09/22/24 11/04/24 Yes Perri DELENA Meliton Mickey., MD  dorzolamide  (TRUSOPT ) 2 % ophthalmic solution Place 1 drop into both eyes daily.   Yes [provider]  insulin  glargine (LANTUS  SOLOSTAR) 100 UNIT/ML Solostar Pen Inject 15 Units into the skin daily. Follow up with your PCP outpatient for further adjustment to your regimen Patient taking differently: Inject 20 Units into the skin daily. Follow up with your PCP outpatient for further adjustment to your regimen 09/03/24  Yes Perri DELENA Meliton Mickey., MD  insulin  lispro (HUMALOG  KWIKPEN) 200 UNIT/ML KwikPen Inject 0-15 Units into the skin 3 (three) times daily with meals. CBG < 70: treat low blood sugar CBG 70 - 120: 0 units  CBG 121 - 150: 2 units  CBG 151 - 200: 3 units  CBG 201 - 250: 5 units  CBG 251 - 300: 8 units  CBG 301 - 350: 11 units  CBG 351 -  400: 15 units  CBG >400: Call MD 09/03/24  Yes Perri DELENA Meliton Mickey., MD  levothyroxine  (SYNTHROID ) 125 MCG tablet Take 1 tablet (125 mcg total) by mouth every morning on an empty stomach 06/08/24  Yes   simvastatin  (ZOCOR ) 20 MG tablet Take 1 tablet (20 mg total) by mouth every evening. 07/09/24  Yes   timolol  (BETIMOL ) 0.5 % ophthalmic solution Place 1 drop into both eyes daily.   Yes [provider]  tirzepatide  (MOUNJARO ) 15 MG/0.5ML Pen Inject 15 mg into the skin once a week. 04/14/24  Yes   torsemide  (DEMADEX ) 100 MG tablet Take 1 tablet (100 mg  total) by mouth in the morning except on dialysis days 10/09/24  Yes Marlee Bernardino NOVAK, MD  clobetasol  ointment (TEMOVATE ) 0.05 % Apply topically daily before sleeping. Patient not taking: Reported on 08/29/2024 05/19/24     Continuous Glucose Sensor (FREESTYLE LIBRE 3 PLUS SENSOR) MISC Apply to upper back of arm. Change every 15 days. 04/03/24     Continuous Glucose Sensor (FREESTYLE LIBRE 3 SENSOR) MISC Apply to upper back of arm and change ever 14 days. 01/28/24   Faythe Purchase, MD  insulin  lispro (HUMALOG  KWIKPEN) 200 UNIT/ML KwikPen Inject 0-15 Units into the skin 3 (three) times daily with meals. Patient not taking: Reported on 11/04/2024 09/30/24 01/07/25    Insulin  Pen Needle (TECHLITE PEN NEEDLES) 32G X 4 MM MISC Use to inject insulin  4 times daily 10/25/22   Faythe Purchase, MD  Insulin  Pen Needle 32G X 4 MM MISC Use to inject insulin  up to 4 times daily. 12/26/23   Faythe Purchase, MD     Vitals:   11/04/24 0912 11/04/24 1000 11/04/24 1030 11/04/24 1130  BP:  107/61 (!) 90/49 (!) 152/76  Pulse:  61 (!) 58 63  Resp:  14 16 18   Temp: (!) 97.5 F (36.4 C)   98 F (36.7 C)  TempSrc: Oral   Oral  SpO2:  100% 100% 98%  Weight:      Height:       Exam Gen alert, no distress Sclera anicteric, throat clear and a bit dry No jvd or bruits Chest clear bilat to bases RRR no MRG Abd soft obese ntnd no mass or ascites +bs Ext no LE or UE  edema, no other edema Neuro is alert, Ox 3 , nf    RIJ TDC intact  Home bp meds: Norvasc  Coreg  Torsemide   Date   Creat  eGFR (ml/min) 2008   1.3- 1.9 2009-2010  2.5- 2.3 2018   1.5 2021   2.1- 2.7 2023   2.1- 3.4 March- may 2025 2.8- 3.1 21- 23 ml/min, IV   Oct 10-16  3.5- 3.8 16- 18 ml/min, IV Oct 28- Sep 22, 2024 3.5- 4.1 15- 18 ml/min, IV / V 10/22/24  2.45   12/16   2.35 11/04/24  2.51     OP HD: [facility] TDU MTTF AKI for now  3h  B400  125kg   3K bath  RIJ TDC  Heparin  2000    Assessment/ Plan: Renal failure: AKI' on dialysis. Pt has had cardiorenal syndrome w/ advancing renal failure over the last 10-15 years. 6 wks ago was started on HD due to volume overload not responding to diuresis. Now he is admitted after falling at home. Creatinine is unusually low and there is the possibility that he has recovered function since starting dialysis. Creat mid 2's here, will monitor his UOP and volume control and renal function and hold off on HD for now. Will follow.  HTN: BP's on the soft side, looks a bit dry. Will start IVF's at 75 cc/hr Volume: as above Anemia of esrd: Hb 11-12, follow.        Myer Fret  MD [facility] 11/04/2024, 4:25 PM  Recent Labs  Lab 11/03/24 2002 11/03/24 2017 11/04/24 0223  HGB 10.8* 11.9* 10.6*  ALBUMIN  3.5  --   --   CALCIUM 8.6*  --  8.5*  CREATININE 2.35* 2.40* 2.51*  K 3.7 3.6 3.4*   Inpatient medications:  Chlorhexidine  Gluconate Cloth  6 each Topical Daily   insulin  aspart  0-6 Units  Subcutaneous Q4H   levETIRAcetam   500 mg Oral Daily   levothyroxine   125 mcg Oral q AM   simvastatin   20 mg Oral QPM   sodium chloride  flush  3 mL Intravenous Q12H    sodium chloride      sodium chloride , acetaminophen  **OR** acetaminophen  (TYLENOL ) oral liquid 160 mg/5 mL **OR** acetaminophen , fentaNYL  (SUBLIMAZE ) injection, labetalol , prochlorperazine , senna, sodium chloride  flush      [1]  Allergies Allergen Reactions   Ace Inhibitors Cough    Maxidex  [Dexamethasone ] Other (See Comments)    Sexual dysfunction   Verapamil Other (See Comments)    constipation

## 2024-11-05 LAB — BASIC METABOLIC PANEL WITH GFR
Anion gap: 13 (ref 5–15)
BUN: 20 mg/dL (ref 8–23)
CO2: 26 mmol/L (ref 22–32)
Calcium: 8.9 mg/dL (ref 8.9–10.3)
Chloride: 105 mmol/L (ref 98–111)
Creatinine, Ser: 3.16 mg/dL — ABNORMAL HIGH (ref 0.61–1.24)
GFR, Estimated: 20 mL/min — ABNORMAL LOW (ref >60)
Glucose, Bld: 186 mg/dL — ABNORMAL HIGH (ref 70–99)
Potassium: 3.7 mmol/L (ref 3.5–5.1)
Sodium: 144 mmol/L (ref 135–145)

## 2024-11-05 LAB — GLUCOSE, CAPILLARY
Glucose-Capillary: 142 mg/dL — ABNORMAL HIGH (ref 70–99)
Glucose-Capillary: 167 mg/dL — ABNORMAL HIGH (ref 70–99)
Glucose-Capillary: 195 mg/dL — ABNORMAL HIGH (ref 70–99)
Glucose-Capillary: 259 mg/dL — ABNORMAL HIGH (ref 70–99)
Glucose-Capillary: 327 mg/dL — ABNORMAL HIGH (ref 70–99)
Glucose-Capillary: 285 mg/dL — ABNORMAL HIGH (ref 70–99)

## 2024-11-05 MED ORDER — CARVEDILOL 6.25 MG PO TABS
6.2500 mg | ORAL_TABLET | Freq: Two times a day (BID) | ORAL | Status: DC
  Administered 2024-11-05 – 2024-11-06 (×2): 6.25 mg via ORAL
  Filled 2024-11-05 (×2): qty 1

## 2024-11-05 NOTE — Progress Notes (Signed)
 Pt receives out-pt HD at TCU at [facility] on M,T,Th,Fri at 8:30 am. Will assist as needed.   [name] Dialysis Navigator [phone]

## 2024-11-05 NOTE — Progress Notes (Signed)
 Patient ID: Brendan Delis., male   DOB: 1955-12-10, 68 y.o.   MRN: [id number] BP (!) 152/77 (BP Location: Right Arm)   Pulse 72   Temp 97.8 F (36.6 C) (Oral)   Resp 10   Ht 6' 2 (1.88 m)   Wt 127.7 kg   SpO2 95%   BMI 36.15 kg/m  Alert, oriented x 4, speech is clear and fluent Moving all extremities Much improved overall  No new recommendations

## 2024-11-05 NOTE — Progress Notes (Signed)
 Physical Therapy Treatment Patient Details Name: Brendan [name]. MRN: [id number] DOB: 1956-11-11 Today's Date: 11/05/2024   History of Present Illness Pt is 68 year old presented to [facility] on  11/03/24 for fall with head trauma. MRI showed large scalp contusion, trace regional subarachnoid hemorrhage anterior frontal lobes, and small hemorrhagic contusions involving the rt greater than lt gyrus recti. PMH - ESRD on HD, necrotizing fasciitis, DM2, HTN, hypothyroidism, OSA, obesity, CKD 4, chronic diastolic CHF, glaucoma, gynecomastia, HLD, vertigo, obesity, peripheral neuropathy, prostate cancer, sickle cell trait    PT Comments  Pt improved with mentation and dizziness.  BP's maintained in the 130's/60-70's, Pt was still limited by minimal dizziness.  Emphasis on transition to EOB without assist, sitting balance without assist, Standing and pre gait and completing session with short distance ambulation with the RW around the bed and back to sit EOB then pivot transfer to the recliner for a chance to make the bed.   If plan is discharge home, recommend the following: A little help with walking and/or transfers;A little help with bathing/dressing/bathroom   Can travel by private vehicle        Equipment Recommendations  None recommended by PT    Recommendations for Other Services Rehab consult     Precautions / Restrictions Precautions Precautions: Fall;Other (comment) (watch pressures) Recall of Precautions/Restrictions: Impaired Precaution/Restrictions Comments: watch BP Restrictions Weight Bearing Restrictions Per Provider Order: No     Mobility  Bed Mobility Overal bed mobility: Needs Assistance Bed Mobility: Sidelying to Sit, Sit to Supine   Sidelying to sit: Used rails, HOB elevated, Contact guard assist   Sit to supine: Contact guard assist        Transfers Overall transfer level: Needs assistance   Transfers: Sit to/from Stand Sit to Stand: Contact guard assist  (various heights low with rails to highter with rails)           General transfer comment: cues for hand placement/safety    Ambulation/Gait Ambulation/Gait assistance: Min assist Gait Distance (Feet): 15 Feet (and then 7, limited by mild dizziness)   Gait Pattern/deviations: Step-through pattern   Gait velocity interpretation: <1.8 ft/sec, indicate of risk for recurrent falls   General Gait Details: moderate incoordination helped by minimal assist and use of the RW.  Pt needed cues for proxity and postural checks.  Pt reported low level dizziness through out, but BP's in sitting/standing ranging throughout the 130's  HR irrecgular in the 60's.   Stairs             Wheelchair Mobility     Tilt Bed    Modified Rankin (Stroke Patients Only)       Balance   Sitting-balance support: No upper extremity supported, Feet supported Sitting balance-Leahy Scale: Fair                                      Hotel Manager: No apparent difficulties  Cognition Arousal: Alert Behavior During Therapy: Flat affect                           PT - Cognition Comments: slow processing Following commands: Impaired Following commands impaired: Follows one step commands with increased time    Cueing Cueing Techniques: Verbal cues, Tactile cues  Exercises      General Comments General comments (skin integrity, edema, etc.): pt's BP's were a  bit soft , but not necessarily orthostatic and dizziness was mild.      Pertinent Vitals/Pain Pain Assessment Pain Assessment: Faces Faces Pain Scale: No hurt Pain Intervention(s): Monitored during session    Home Living                          Prior Function            PT Goals (current goals can now be found in the care plan section) Acute Rehab PT Goals PT Goal Formulation: With patient Time For Goal Achievement: 11/18/24 Potential to Achieve Goals:  Good Progress towards PT goals: Progressing toward goals    Frequency    Min 2X/week      PT Plan      Co-evaluation              AM-PAC PT 6 Clicks Mobility   Outcome Measure  Help needed turning from your back to your side while in a flat bed without using bedrails?: A Little Help needed moving from lying on your back to sitting on the side of a flat bed without using bedrails?: A Little Help needed moving to and from a bed to a chair (including a wheelchair)?: A Lot Help needed standing up from a chair using your arms (e.g., wheelchair or bedside chair)?: A Little Help needed to walk in hospital room?: A Little Help needed climbing 3-5 steps with a railing? : A Lot 6 Click Score: 16    End of Session   Activity Tolerance: Patient tolerated treatment well (limited by dizzooomd) Patient left: with bed alarm set;in bed;with call bell/phone within reach Nurse Communication: Mobility status;Other (comment)       Time: 8478-8444 PT Time Calculation (min) (ACUTE ONLY): 34 min  Charges:    $Gait Training: 8-22 mins $Therapeutic Activity: 8-22 mins PT General Charges $$ ACUTE PT VISIT: 1 Visit                     11/05/2024  [place] HERO., PT Acute Rehabilitation Services [phone]  (office)   Brendan [name] 11/05/2024, 4:30 PM

## 2024-11-05 NOTE — Evaluation (Addendum)
 Speech Language Pathology Evaluation Patient Details Name: Brendan [name]. MRN: [id number] DOB: [birth year] Today's Date: 11/05/2024 Time: 8956-8997 SLP Time Calculation (min) (ACUTE ONLY): 1399 min  Problem List:  Patient Active Problem List   Diagnosis Date Noted   TBI (traumatic brain injury) (HCC) 11/03/2024   Acute on chronic heart failure with preserved ejection fraction (HFpEF, >= 50%) (HCC) 09/15/2024   Hyperlipidemia associated with type 2 diabetes mellitus (HCC) 09/15/2024   Anemia of chronic disease 09/15/2024   Sepsis (HCC) 08/29/2024   Necrotizing fasciitis (HCC) 08/28/2024   Hx of adenomatous colonic polyps 08/26/2023   Research study patient 01/09/2023   Acute on chronic congestive heart failure (HCC) 01/09/2023   ESRD on dialysis (HCC) 01/08/2023   Heart failure, systolic, acute (HCC) 03/26/2020   Acute on chronic diastolic CHF (congestive heart failure) (HCC) 03/25/2020   Hypokalemia 03/25/2020   Leukocytosis 03/25/2020   Renal insufficiency 03/25/2020   Chest pain 03/25/2020   Hypothyroidism 03/25/2020   Gout attack 03/25/2020   Morbid obesity (HCC) 10/28/2015   Internal hemorrhoids with bleeding 07/31/2013   Insulin -requiring or dependent type II diabetes mellitus (HCC) 01/11/2010   Obstructive sleep apnea 01/11/2010   Hypertension associated with diabetes (HCC) 01/11/2010   GLAUCOMA 01/10/2010   Allergic rhinitis 01/10/2010   PROSTATE CANCER, HX OF 01/10/2010   Past Medical History:  Past Medical History:  Diagnosis Date   Acanthosis nigricans    Atopic dermatitis    CHF (congestive heart failure) (HCC)    CKD (chronic kidney disease) stage 4, GFR 15-29 ml/min (HCC) 01/08/2023   Diabetes (HCC) 11/19/2002   Erectile dysfunction    Fatty liver 11/19/2005   Glaucoma 11/19/2006   Gynecomastia 11/20/2007   Hx of adenomatous colonic polyps 08/26/2023   Hyperaldosteronism 11/19/1998   Hypercholesterolemia    Hyperlipidemia 2010   Hypertension 1999    Hypoglycemic reaction    Hypothyroidism 11/19/2002   Incontinence    Intermittent vertigo 11/19/2010   Left cervical radiculopathy 11/19/2010   Lumbar radiculopathy    Obesity    Peripheral neuropathy    Pollen allergies 11/19/2005   perennial   Prostate cancer (HCC) 11/19/2004   Reflux esophagitis 11/19/1993   Sickle cell trait 11/19/2004   Sleep apnea, obstructive 11/19/1998   uses a cpap   Venous insufficiency 11/20/2003   Vitamin D deficiency 11/19/2010   Past Surgical History:  Past Surgical History:  Procedure Laterality Date   COLONOSCOPY  2006   normal   INCISION AND DRAINAGE OF WOUND N/A 08/28/2024   Procedure: IRRIGATION AND EXCISIONAL DEBRIDEMENT WOUND;  Surgeon: Lyndel Deward PARAS, MD;  Location: [facility] OR;  Service: General;  Laterality: N/A;  EXCISIONAL DEBRIDEMENT   INCISION AND DRAINAGE PERIRECTAL ABSCESS N/A 08/30/2024   Procedure: INCISION AND DRAINAGE OF PERINEAL WOUND;  Surgeon: Polly Cordella LABOR, MD;  Location: [facility] OR;  Service: General;  Laterality: N/A;   IR TUNNELED CENTRAL VENOUS CATH PLC W IMG  09/18/2024   IR TUNNELED CENTRAL VENOUS CATH PLC W IMG  09/22/2024   lap band surgery  2009   left inguinal hernia repair  1998   LIPOMA EXCISION Left 04/17/2023   Procedure: MINOR EXCISION LEFT BUTTOCK SEBACEOUS CYST;  Surgeon: Lyndel Deward PARAS, MD;  Location: [facility];  Service: General;  Laterality: Left;   robotic prostatectomy  2008   HPI:  Pt is 68 year old presented to [facility] on 11/03/24 for fall with head trauma. MRI showed large scalp contusion, trace regional subarachnoid hemorrhage anterior frontal  lobes, and small hemorrhagic contusions involving the rt greater than lt gyrus recti. PMH - ESRD on HD, necrotizing fasciitis, DM2, HTN, hypothyroidism, OSA, obesity, CKD 4, chronic diastolic CHF, glaucoma, gynecomastia, HLD, vertigo, obesity, peripheral neuropathy, prostate cancer, sickle cell trait   Assessment / Plan /  Recommendation Clinical Impression  Pt presents with intact language and speech and denies cognitive deficits (wife not present). He scored in average range on Cognistat assessment in all subtests except mild impairment in memory for 4 word recall. He was unable to state brain bleed but later recalled  information from neurosurgeon once prompted. Pt did not recall PT/OT sessions yesterday and stated he didn't do anything however session was limited due to his dizziness and hypotention. He appears functional from a cognitive standpoint for the acute setting however would benefit from further assessment for functional memory in next venue (inpatient rehab if candidate). He is responsible for his medications, finances and appointments. Pt educated/suggested to write information pertaining to current status which he did not feel he needed to do at this time. Memory may improve with time however needs follow up at next venue of care to assess ability to recall relevant/recent information and continue to manage responsibilities at home.  Overall demonstrated [place] (appropriate; purposeful) behaviors not quite using compensatory strategies as of yet     SLP Assessment  SLP Recommendation/Assessment: All further Speech Language Pathology needs can be addressed in the next venue of care SLP Visit Diagnosis: Cognitive communication deficit (R41.841)     Assistance Recommended at Discharge     Functional Status Assessment Patient has had a recent decline in their functional status and demonstrates the ability to make significant improvements in function in a reasonable and predictable amount of time. (rec assessment for memory in next venue)  Frequency and Duration           SLP Evaluation Cognition  Arousal/Alertness: Awake/alert Orientation Level: Oriented to person;Oriented to place;Oriented to time;Other (comment) (knew he fell, did not state brain bleed but has been told- remebered [name]  stating he would not need surgery when prompted) Year: 2025 Day of Week: Correct Attention: Sustained Sustained Attention: Appears intact Memory: Impaired Memory Impairment: Retrieval deficit (scored a 9- mild impairment, some difficulty recalling recent info) Awareness: Appears intact Problem Solving: Appears intact Safety/Judgment: Appears intact       Comprehension  Auditory Comprehension Overall Auditory Comprehension: Appears within functional limits for tasks assessed Yes/No Questions:  (followed 3 step command x 2) Visual Recognition/Discrimination Discrimination: Not tested Reading Comprehension Reading Status: Not tested    Expression Expression Primary Mode of Expression: Verbal Verbal Expression Overall Verbal Expression: Appears within functional limits for tasks assessed Written Expression Written Expression: Not tested   Oral / Motor  Oral Motor/Sensory Function Overall Oral Motor/Sensory Function: Within functional limits Motor Speech Overall Motor Speech: Appears within functional limits for tasks assessed            Brendan [name] 11/05/2024, 10:36 AM

## 2024-11-05 NOTE — Progress Notes (Signed)
 [facility] Progress Note  Subjective:  Seen in room, no c/o's today  Presentation summary: 68 y.o. year-old w/ PMH as below who presented to ED on 12/16 brought by EMS from home after his wife found him unconscious on the floor with a large hematoma in the back of the head.  Also was weak on his right side. In ED BP was 150/80, HR 78, RR 21, temp 97, 98% sat on room air.  Labs showed K+ 3.6, BUN 14, creatinine 2.4, calcium 8.6, albumin  3.5 Hgb 11, WBC 17K.  UA showed small hemoglobin and LE, rare bacteria, 11-20 WBCs.  Head CT was concerning for mild contusions along the inferior aspect of the both frontal lobes and the right frontal convexity most consistent with meningioma.  Neurosurgery and neurology were consulted and patient was given IV Keppra  load, Benadryl , Zofran , and Reglan  were given also.  Patient was admitted for fall with TBI, questionable CVA.  Patient was admitted.  We are asked to see for renal failure. Pt seen in room. He was recently started on dialysis in early November 2025 when he was admitted and had significant severe volume overload which did not respond to diuresis.  He was discharged and has been getting dialysis at [facility] on a MTTS schedule.   Vitals:   11/04/24 2326 11/05/24 0334 11/05/24 0725 11/05/24 1135  BP: (!) 150/81 (!) 157/77 (!) 141/73 (!) 152/77  Pulse: 62 66 71 72  Resp: 13 20 10 10   Temp: 97.9 F (36.6 C) 97.7 F (36.5 C) 97.7 F (36.5 C) 97.8 F (36.6 C)  TempSrc: Oral Oral Oral Oral  SpO2: 100% 100% 94% 95%  Weight:      Height:        Exam: Gen alert, no distress Sclera anicteric, throat clear No jvd or bruits Chest clear bilat to bases RRR no MRG Abd soft obese ntnd no mass or ascites +bs Ext no LE or UE edema, no other edema Neuro is alert, Ox 3 , nf    RIJ TDC intact   Home bp meds: Norvasc  Coreg  Torsemide    Date                             Creat               eGFR (ml/min) 2008                            1.3-  1.9 2009-2010                   1.6- 2.3 2018                            1.5 2021                            2.1- 2.7 2023                            2.1- 3.4 March- may 2025        2.8- 3.1            21- 23 ml/min, IV                      Oct 10-16  3.5- 3.8            16- 18 ml/min, IV Oct 28- Sep 22, 2024 3.5- 4.1   15- 18 ml/min, IV / V 10/22/24                      2.45                  12/16                           2.35 11/04/24                      2.51        OP HD: Gen alert, no distress Sclera anicteric, throat clear and a bit dry No jvd or bruits Chest clear bilat to bases RRR no MRG Abd soft obese ntnd no mass or ascites +bs Ext no LE or UE edema, no other edema Neuro is alert, Ox 3 , nf    RIJ TDC intact   Home bp meds: Norvasc  Coreg  Torsemide    Date                             Creat               eGFR (ml/min) 2008                            1.3- 1.9 2009-2010                   2.5- 2.3 2018                            1.5 2021                            2.1- 2.7 2023                            2.1- 3.4 March- may 2025        2.8- 3.1            21- 23 ml/min, IV                      Oct 10-16                    3.5- 3.8            16- 18 ml/min, IV Oct 28- Sep 22, 2024  3.5- 4.1  15- 18 ml/min, IV / V 10/22/24                      2.45                  12/16                           2.35 11/04/24                      2.51        OP HD:  [facility] MTTF 3h   B400    125kg   TDC  Hep 5000 Calcitriol 0.5mcg with HD Micera 100mcg Q 2 weeks     Assessment/ Plan: Renal failure: AKI' on dialysis. Pt has had cardiorenal syndrome w/ advancing renal failure over the last 10-15 years. 6 wks ago was started on HD due to volume overload not responding to diuresis. The creatinine then was 3.5- 4.1 range. Now he is admitted after falling at home. Creatinine is unusually low at 2.4, and there is the possibility that he has recovered function since  starting dialysis. Will hold off on dialysis for now and see how is UOP and labs progress over the next couple of days. Creat 3.1 today, UOP not recorded, will request I/O's. No need for RRT at this time. Will follow.  HTN: BP's are up today, wnl, 150/ 75 range.  Will dc IVFs.  Volume: looks euvolemic on exam, no LE edema.  Anemia of esrd: Hb 11-12, follow.    Myer Fret MD  [facility] 11/05/2024, 12:06 PM  Recent Labs  Lab 11/03/24 2002 11/03/24 2017 11/04/24 0223 11/05/24 0847  HGB 10.8* 11.9* 10.6*  --   ALBUMIN  3.5  --   --   --   CALCIUM 8.6*  --  8.5* 8.9  CREATININE 2.35* 2.40* 2.51* 3.16*  K 3.7 3.6 3.4* 3.7   No results for input(s): IRON , TIBC, FERRITIN in the last 168 hours. Inpatient medications:  carvedilol   6.25 mg Oral BID WC   Chlorhexidine  Gluconate Cloth  6 each Topical Daily   insulin  aspart  0-6 Units Subcutaneous Q4H   levETIRAcetam   500 mg Oral Daily   levothyroxine   125 mcg Oral q AM   simvastatin   20 mg Oral QPM   sodium chloride  flush  3 mL Intravenous Q12H    sodium chloride  75 mL/hr at 11/05/24 1153   acetaminophen  **OR** acetaminophen  (TYLENOL ) oral liquid 160 mg/5 mL **OR** acetaminophen , fentaNYL  (SUBLIMAZE ) injection, labetalol , prochlorperazine , senna, sodium chloride  flush

## 2024-11-05 NOTE — Progress Notes (Signed)
° °  Inpatient Rehabilitation Admissions Coordinator   I will place rehab consult for full assessment of needs for possible Cir admit .  Heron Leavell, RN, MSN Rehab Admissions Coordinator [phone] 11/05/2024 5:05 PM

## 2024-11-05 NOTE — Progress Notes (Signed)
 PROGRESS NOTE    Brendan [name].  FMW:[id number] DOB: Apr 14, 1956 DOA: 11/03/2024 PCP: Rexanne Ingle, MD  Subjective: Patient reports feeling better, no longer having dizziness. States that he is hungry and would like to eat.     Hospital Course: 68 year old male with PMH of DM2, HTN, HLD, poor thyroidism, OSA on CPAP, Fournier's gangrene (October 2025), CKD stage V recently started on HD, who presented to the ED after he was found unconscious on the floor.  Per patient he had lost his balance and fell down 3 steps and hit the back of his head after he returned home from dialysis.  CT head showed right frontal extra-axial mass, most consistent meningioma, posterior scalp and left prefrontal scalp hematomas.  Neurosurgery and neurology were consulted.    Assessment and Plan:  1. Fall with TBI;  - Found down at home with LOC - Found on CT to have mild contusion along inferior frontal lobes and right frontal meningioma. Repeat CT head showed stable acute hemorrhage along right inferior frontal lobe, stable acute subarachnoid hemorrhage overlying right frontal lobe, and scalp hematomas.  MRI brain showed small hemorrhagic contusions, trace regional subarachnoid hemorrhage, trace intraventricular hemorrhage, large posterior scalp contusion and the meningioma - Appreciate neurology and neurosurgery assessment and recommendations  - started on keppra  for seizure prophylaxis, renally dosed for 1 week.  Neurology did not recommend to continue AEDs after - PT/OT/SLP eval - mental status much improved today    3. CKD stage V, recently started HD as outpatient  - Completed HD on day of admission  - Renally-dose medications, restrict fluids   - nephrology following, monitoring him off of HD for now - check daily labs    3. Type II DM  - A1c was 8.4% in October 2025  - Check CBGs and use low-intensity SSI     4. Hypertension  - BP much improved now - resumed coreg  at half dose, monitor BP and  increase coreg /resume amlodipine  as appropriate    5. HLD  - Continue simvastatin      6. Hypothyroidism  - Levothyroxine      7. OSA  - CPAP while sleeping      DVT prophylaxis: SCD's Start: 11/03/24 2248    Code Status: Full Code Family Communication: will update once arrives at bedside  Disposition Plan: TBD Reason for continuing need for hospitalization: Not med ready   Objective: Vitals:   11/04/24 2326 11/05/24 0334 11/05/24 0725 11/05/24 1135  BP: (!) 150/81 (!) 157/77 (!) 141/73 (!) 152/77  Pulse: 62 66 71 72  Resp: 13 20 10 10   Temp: 97.9 F (36.6 C) 97.7 F (36.5 C) 97.7 F (36.5 C) 97.8 F (36.6 C)  TempSrc: Oral Oral Oral Oral  SpO2: 100% 100% 94% 95%  Weight:      Height:        Intake/Output Summary (Last 24 hours) at 11/05/2024 1338 Last data filed at 11/05/2024 0949 Gross per 24 hour  Intake 150.8 ml  Output --  Net 150.8 ml   Filed Weights   11/03/24 2002  Weight: 127.7 kg    Examination:  Physical Exam Vitals and nursing note reviewed.  Constitutional:      General: He is not in acute distress. Cardiovascular:     Rate and Rhythm: Normal rate.  Pulmonary:     Effort: No respiratory distress.     Breath sounds: No wheezing.  Abdominal:     General: There is no distension.  Tenderness: There is no abdominal tenderness.  Neurological:     Mental Status: He is oriented to person, place, and time.     Data Reviewed: I have personally reviewed following labs and imaging studies  CBC: Recent Labs  Lab 11/03/24 2002 11/03/24 2017 11/04/24 0223  WBC 17.8*  --  13.9*  HGB 10.8* 11.9* 10.6*  HCT 32.9* 35.0* 32.8*  MCV 90.9  --  90.1  PLT 219  --  232   Basic Metabolic Panel: Recent Labs  Lab 11/03/24 2002 11/03/24 2017 11/04/24 0223 11/05/24 0847  NA 139 139 139 144  K 3.7 3.6 3.4* 3.7  CL 101 102 102 105  CO2 25  --  25 26  GLUCOSE 295* 303* 284* 186*  BUN 13 14 15 20   CREATININE 2.35* 2.40* 2.51* 3.16*  CALCIUM  8.6*  --  8.5* 8.9   GFR: Estimated Creatinine Clearance: 31.3 mL/min (A) (by C-G formula based on SCr of 3.16 mg/dL (H)). Liver Function Tests: Recent Labs  Lab 11/03/24 2002  AST 30  ALT 16  ALKPHOS 108  BILITOT 0.7  PROT 7.2  ALBUMIN  3.5   No results for input(s): LIPASE, AMYLASE in the last 168 hours. No results for input(s): AMMONIA in the last 168 hours. Coagulation Profile: No results for input(s): INR, PROTIME in the last 168 hours. Cardiac Enzymes: Recent Labs  Lab 11/03/24 2002  CKTOTAL 107   ProBNP, BNP (last 5 results) Recent Labs    01/20/24 1042 08/28/24 1613 09/15/24 1846  PROBNP  --  6,038.0*  --   BNP 295.6*  --  728.9*   HbA1C: No results for input(s): HGBA1C in the last 72 hours. CBG: Recent Labs  Lab 11/04/24 1924 11/04/24 2334 11/05/24 0339 11/05/24 0816 11/05/24 1148  GLUCAP 144* 143* 142* 167* 195*   Lipid Profile: No results for input(s): CHOL, HDL, LDLCALC, TRIG, CHOLHDL, LDLDIRECT in the last 72 hours. Thyroid  Function Tests: No results for input(s): TSH, T4TOTAL, FREET4, T3FREE, THYROIDAB in the last 72 hours. Anemia Panel: No results for input(s): VITAMINB12, FOLATE, FERRITIN, TIBC, IRON , RETICCTPCT in the last 72 hours. Sepsis Labs: Recent Labs  Lab 11/03/24 2017  LATICACIDVEN 1.6    No results found for this or any previous visit (from the past 240 hours).   Radiology Studies: CT Head Wo Contrast Result Date: 11/04/2024 EXAM: CT HEAD WITHOUT CONTRAST 11/04/2024 04:19:35 AM TECHNIQUE: CT of the head was performed without the administration of intravenous contrast. Automated exposure control, iterative reconstruction, and/or weight based adjustment of the mA/kV was utilized to reduce the radiation dose to as low as reasonably achievable. COMPARISON: CT of the head and MRI of the head dated 11/03/2024. CLINICAL HISTORY: head trauma, 8 hour scan. FINDINGS: BRAIN AND VENTRICLES:  Focal hyperdensity within right pons, unchanged. Stable acute hemorrhage along right inferior frontal lobe. Stable acute subarachnoid hemorrhage along right frontal lobe. Right frontal convexity extra-axial mass lesion measuring 3.1 x 2.1 x 2.6 cm, likely meningioma. Stable associated local cortical mass effect. Moderate patchy and ill-defined hypoattenuation within cerebral white matter, compatible with chronic small vessel ischemic disease. Persistent pneumocephalus. No evidence of acute infarct. No hydrocephalus. No midline shift. Moderate atherosclerotic calcifications. ORBITS: No acute abnormality. SINUSES: Mucosal thickening within bilateral maxillary sinuses. SOFT TISSUES AND SKULL: Left frontal and left parietooccipital scalp hematomas. No skull fracture. IMPRESSION: 1. Stable acute hemorrhage along right inferior frontal lobe and stable acute subarachnoid hemorrhage along right frontal lobe. 2. Right frontal convexity extra-axial mass lesion, likely  meningioma, with stable associated local cortical mass effect. 3. Moderate patchy and ill-defined hypoattenuation within cerebral white matter, compatible with chronic small vessel ischemic disease. 4. Left frontal and left parietooccipital scalp hematomas. Electronically signed by: Evalene Coho MD 11/04/2024 05:02 AM EST RP Workstation: [id number]   MR Brain W and Wo Contrast Result Date: 11/04/2024 EXAM: MRI BRAIN WITH AND WITHOUT CONTRAST 11/03/2024 11:27:47 PM TECHNIQUE: Multiplanar multisequence MRI of the head/brain was performed with and without the administration of 10 mL Gadobutrol  1 mmol/mL IV solution. COMPARISON: CT from earlier the same day. CLINICAL HISTORY: fall, head injury, dizziness FINDINGS: LIMITATIONS/ARTIFACTS: Examination moderately to severely degraded by motion artifact. BRAIN AND VENTRICLES: No acute infarct. Small hemorrhagic contusions involving the right greater than left gyrus recti are seen. The largest hematoma on the  right measures up to 2.1 cm (series 5, image 12). Mild localized edema without significant regional mass effect or midline shift. Trace regional subarachnoid hemorrhage in this region. Probable trace extra axial hemorrhage along the anterior falx. Trace intraventricular hemorrhage with blood seen layering within the occipital horns noted as well (series 7, images 50, 42). Patchy T2/FLAIR hyperintensity involving the periventricular, deep, and subcortical white matter of the cerebral hemispheres, nonspecific, but most commonly related to chronic small vessel ischemic disease. Changes are moderate in nature. Mild patchy involvement of the pons. Enhancing extra-axial mass overlying the anterior right frontal convexity measures 4.0 x 2.3 x 4.3 cm (AP by craniocaudad by transverse), most characteristics of a meningioma. Lesion abuts but does not definitively invade the superior sagittal sinus anteriorly. No significant associated vasogenic edema or regional mass effect. No hydrocephalus. The sella is unremarkable. Normal flow voids. ORBITS: No acute abnormality. SINUSES: Scattered mucosal thickening seen throughout the paranasal sinuses. BONES AND SOFT TISSUES: Normal bone marrow signal and enhancement. Large posterior scalp contusion, with additional smaller scalp contusion at the left forehead. Small left greater than right mastoid effusions, of definite significance. Imaged nasopharynx unremarkable. IMPRESSION: 1. Small hemorrhagic contusions involving the right greater than left gyrus [email or web address] localized edema without significant regional mass effect or midline shift. 2. Trace regional subarachnoid hemorrhage at the anterior frontal lobes bilaterally, with trace intraventricular hemorrhage within the occipital horns of both lateral ventricles. No hydrocephalus. 3. Large posterior scalp contusion, with additional smaller scalp contusion at the left forehead. 4. Enhancing 4.0 x 2.3 x 4.3 cm extra-axial mass overlying  the anterior right frontal convexity, most characteristic of a meningioma. No significant surrounding vasogenic edema or regional mass effect. Electronically signed by: Morene Hoard MD 11/04/2024 12:50 AM EST RP Workstation: [id number]   DG Pelvis Portable Result Date: 11/03/2024 EXAM: 1 or 2 VIEW(S) XRAY OF THE PELVIS 11/03/2024 09:12:00 PM COMPARISON: None available. CLINICAL HISTORY: Trauma FINDINGS: LIMITATIONS/ARTIFACTS: Marked underpenetration, particularly at the pelvis and hips. BONES AND JOINTS: No acute fracture. No malalignment. SOFT TISSUES: The soft tissues are unremarkable. IMPRESSION: 1. No evidence of acute traumatic injury. 2. Marked underpenetration, particularly at the pelvis and hips. Electronically signed by: Oneil Devonshire MD 11/03/2024 09:20 PM EST RP Workstation: [id number]   DG Chest Port 1 View Result Date: 11/03/2024 EXAM: 1 VIEW(S) XRAY OF THE CHEST 11/03/2024 09:12:00 PM COMPARISON: 09/15/2024 CLINICAL HISTORY: Trauma FINDINGS: LINES, TUBES AND DEVICES: Tunneled right IJ CVC in place with tip projecting over the superior cavoatrial junction. LUNGS AND PLEURA: Low lung volumes accentuate pulmonary vascularity. No focal consolidation. No pleural effusion. No pneumothorax. HEART AND MEDIASTINUM: Stable cardiomegaly. BONES AND SOFT TISSUES: No acute osseous abnormality. IMPRESSION: 1. No acute  process. Electronically signed by: Oneil Devonshire MD 11/03/2024 09:19 PM EST RP Workstation: [id number]   CT Head Wo Contrast Addendum Date: 11/03/2024 ** ADDENDUM #1 ** ADDENDUM: Additionally there is increased attenuation noted along the inferior aspect of the frontal lobes bilaterally, consistent with mild contusions. This is commensurate with the patient's given injury. Findings were called to Dr. Yolande at the time of exam interpretation. ---------------------------------------------------- Electronically signed by: Oneil Devonshire MD 11/03/2024 09:05 PM EST RP Workstation: [id number]    Result Date: 11/03/2024 ** ORIGINAL REPORT ** EXAM: CT HEAD WITHOUT CONTRAST 11/03/2024 08:34:10 PM TECHNIQUE: CT of the head was performed without the administration of intravenous contrast. Automated exposure control, iterative reconstruction, and/or weight based adjustment of the mA/kV was utilized to reduce the radiation dose to as low as reasonably achievable. COMPARISON: None available. CLINICAL HISTORY: found down, ams FINDINGS: BRAIN AND VENTRICLES: Tiny focus of increased attenuation is noted in the midportion of the pons, consistent with focal calcification. No other focal hemorrhage is noted. No evidence of acute infarct. 2.6 x 1.4 x 2.8 cm right frontal convexity extra-axial mass most consistent with meningioma. Moderate chronic microvascular ischemic change. ORBITS: No acute abnormality. SINUSES: Mucosal thickening in the maxillary and bilateral ethmoid sinuses. SOFT TISSUES AND SKULL: Midline posterior scalp and left prefrontal scalp hematomas. No skull fracture. IMPRESSION: 1. 2.6 x 1.4 x 2.8 cm right frontal convexity extra-axial mass most consistent with meningioma. 2. Midline posterior scalp and left prefrontal scalp hematomas. 3. Tiny focus of increased attenuation in the midportion of the pons, likely related to calcification. Electronically signed by: Oneil Devonshire MD 11/03/2024 08:56 PM EST RP Workstation: [id number]   CT Cervical Spine Wo Contrast Result Date: 11/03/2024 EXAM: CT CERVICAL SPINE WITHOUT CONTRAST 11/03/2024 08:34:10 PM TECHNIQUE: CT of the cervical spine was performed without the administration of intravenous contrast. Multiplanar reformatted images are provided for review. Automated exposure control, iterative reconstruction, and/or weight based adjustment of the mA/kV was utilized to reduce the radiation dose to as low as reasonably achievable. COMPARISON: None available. CLINICAL HISTORY: Neck trauma (Age >= 65y) FINDINGS: BONES AND ALIGNMENT: 7 cervical segments are  well visualized. Vertebral body height is well maintained. No acute fracture or acute facet abnormality is noted. DEGENERATIVE CHANGES: Osteophytic changes and facet hypertrophic changes are seen. SOFT TISSUES: Surrounding soft tissue structures are within normal limits. LUNG APICES: Apices are within normal limits. IMPRESSION: 1. Multilevel degenerative change without acute abnormality. Electronically signed by: Oneil Devonshire MD 11/03/2024 08:58 PM EST RP Workstation: [id number]    Scheduled Meds:  carvedilol   6.25 mg Oral BID WC   Chlorhexidine  Gluconate Cloth  6 each Topical Daily   insulin  aspart  0-6 Units Subcutaneous Q4H   levETIRAcetam   500 mg Oral Daily   levothyroxine   125 mcg Oral q AM   simvastatin   20 mg Oral QPM   sodium chloride  flush  3 mL Intravenous Q12H   Continuous Infusions:   LOS: 1 day   Time spent: 38 minutes  Casimer Dare, MD  [facility]  11/05/2024, 1:38 PM

## 2024-11-05 NOTE — Plan of Care (Signed)

## 2024-11-06 LAB — BASIC METABOLIC PANEL WITH GFR
Anion gap: 13 (ref 5–15)
BUN: 29 mg/dL — ABNORMAL HIGH (ref 8–23)
CO2: 25 mmol/L (ref 22–32)
Calcium: 8.7 mg/dL — ABNORMAL LOW (ref 8.9–10.3)
Chloride: 102 mmol/L (ref 98–111)
Creatinine, Ser: 3.72 mg/dL — ABNORMAL HIGH (ref 0.61–1.24)
GFR, Estimated: 17 mL/min — ABNORMAL LOW
Glucose, Bld: 375 mg/dL — ABNORMAL HIGH (ref 70–99)
Potassium: 4 mmol/L (ref 3.5–5.1)
Sodium: 140 mmol/L (ref 135–145)

## 2024-11-06 LAB — GLUCOSE, CAPILLARY
Glucose-Capillary: 369 mg/dL — ABNORMAL HIGH (ref 70–99)
Glucose-Capillary: 411 mg/dL — ABNORMAL HIGH (ref 70–99)
Glucose-Capillary: 355 mg/dL — ABNORMAL HIGH (ref 70–99)

## 2024-11-06 LAB — HEPATITIS B SURFACE ANTIGEN: Hepatitis B Surface Ag: NONREACTIVE

## 2024-11-06 MED ORDER — INSULIN GLARGINE 100 UNIT/ML ~~LOC~~ SOLN
12.0000 [IU] | Freq: Every day | SUBCUTANEOUS | Status: DC
  Administered 2024-11-06: 12 [IU] via SUBCUTANEOUS
  Filled 2024-11-06: qty 0.12

## 2024-11-06 MED ORDER — PENTAFLUOROPROP-TETRAFLUOROETH EX AERO: 1.0000 | INHALATION_SPRAY | CUTANEOUS | Status: DC | PRN

## 2024-11-06 MED ORDER — HEPARIN SODIUM (PORCINE) 1000 UNIT/ML DIALYSIS: 1500.0000 [IU] | INTRAMUSCULAR | Status: DC | PRN

## 2024-11-06 MED ORDER — HEPARIN SODIUM (PORCINE) 1000 UNIT/ML IJ SOLN
INTRAMUSCULAR | Status: AC
  Filled 2024-11-06: qty 4

## 2024-11-06 MED ORDER — NEPRO/CARBSTEADY PO LIQD: 237.0000 mL | Freq: Two times a day (BID) | ORAL | Status: DC

## 2024-11-06 MED ORDER — CARVEDILOL 12.5 MG PO TABS
12.5000 mg | ORAL_TABLET | Freq: Two times a day (BID) | ORAL | Status: DC
  Administered 2024-11-06: 12.5 mg via ORAL
  Filled 2024-11-06: qty 1

## 2024-11-06 MED ORDER — CHLORHEXIDINE GLUCONATE CLOTH 2 % EX PADS: 6.0000 | MEDICATED_PAD | Freq: Every day | CUTANEOUS | Status: DC

## 2024-11-06 MED ORDER — INSULIN GLARGINE 100 UNIT/ML ~~LOC~~ SOLN: 12.0000 [IU] | Freq: Every day | SUBCUTANEOUS | Status: DC

## 2024-11-06 MED ORDER — LIDOCAINE HCL (PF) 1 % IJ SOLN: 5.0000 mL | INTRAMUSCULAR | Status: DC | PRN

## 2024-11-06 MED ORDER — NEPRO/CARBSTEADY PO LIQD: 237.0000 mL | ORAL | Status: DC | PRN

## 2024-11-06 MED ORDER — ALTEPLASE 2 MG IJ SOLR: 2.0000 mg | Freq: Once | INTRAMUSCULAR | Status: DC | PRN

## 2024-11-06 MED ORDER — LIDOCAINE-PRILOCAINE 2.5-2.5 % EX CREA: 1.0000 | TOPICAL_CREAM | CUTANEOUS | Status: DC | PRN

## 2024-11-06 MED ORDER — HEPARIN SODIUM (PORCINE) 1000 UNIT/ML DIALYSIS: 1000.0000 [IU] | INTRAMUSCULAR | Status: DC | PRN

## 2024-11-06 MED ORDER — ANTICOAGULANT SODIUM CITRATE 4% (200MG/5ML) IV SOLN: 5.0000 mL | Status: DC | PRN

## 2024-11-06 MED ORDER — HEPARIN SODIUM (PORCINE) 1000 UNIT/ML DIALYSIS
3500.0000 [IU] | Freq: Once | INTRAMUSCULAR | Status: AC
  Administered 2024-11-06: 3500 [IU] via INTRAVENOUS_CENTRAL
  Filled 2024-11-06: qty 4

## 2024-11-06 MED ORDER — INSULIN ASPART 100 UNIT/ML IJ SOLN
5.0000 [IU] | Freq: Once | INTRAMUSCULAR | Status: AC
  Administered 2024-11-06: 5 [IU] via SUBCUTANEOUS

## 2024-11-06 NOTE — Inpatient Diabetes Management (Signed)
 Inpatient Diabetes Program Recommendations  AACE/ADA: New Consensus Statement on Inpatient Glycemic Control (2015)  Target Ranges:  Prepandial:   less than 140 mg/dL      Peak postprandial:   less than 180 mg/dL (1-2 hours)      Critically ill patients:  140 - 180 mg/dL    Latest Reference Range & Units 11/04/24 23:34 11/05/24 03:39 11/05/24 08:16 11/05/24 11:48 11/05/24 15:50 11/05/24 19:28  Glucose-Capillary 70 - 99 mg/dL 856 (H) 857 (H) 832 (H)  1 unit Novolog   195 (H)  1 unit Novolog   259 (H)  3 units Novolog   327 (H)  3 units Novolog    (H): Data is abnormally high  Latest Reference Range & Units 11/05/24 23:09 11/06/24 03:01  Glucose-Capillary 70 - 99 mg/dL 714 (H)  3 units Novolog   369 (H)  5 units Novolog    (H): Data is abnormally high   Admit with: Fall with TBI; ?CVA    History: DM, ESRD (recently started on HD)   Home DM Meds: Freestyle Libre 3 CGM                             Lantus  15 units daily                             Humalog  0-15 units TID per SSI                             Mounjaro  15 mg Qweek   Current Orders: Novolog  0-6 units Q4H     MD- Note pt takes Lantus  insulin  at home.  CBGs >250 since MN   Please consider starting 75% home dose Lantus :   Semglee  12 units Daily (please start this AM)      ENDO: Dr. Faythe Last Seen 10/26/2024 Was noted to be taking: Lantus  20 units daily + Humalog  8-16 units BID with Lunch and Dinner meals + Mounjaro  15 mg Qweek    --Will follow patient during hospitalization--  Adina Rudolpho Arrow RN, MSN, CDCES Diabetes Coordinator Inpatient Glycemic Control Team Team Pager: [phone] (8a-5p)

## 2024-11-06 NOTE — Progress Notes (Signed)
 Received patient in bed to unit.  Alert and oriented.  Informed consent signed and in chart.   TX duration: 2.5hrs  Patient tolerated well.  Transported back to the room  Alert, without acute distress.  Hand-off given to patient's nurse.   Access used: R. CVC; dressing changed and is C, D, and I.  Access issues: N/A  Total UF removed: 2000 Medication(s) given: N/A Post HD VS: Temp 97.9, HR 66, BP 145/78 Post HD weight: 120.4kg  Brendan Rhein, RN [facility]  11/06/24 1749  Vitals  Temp 97.9 F (36.6 C)  BP (!) 145/78  MAP (mmHg) 99  Pulse Rate 65  ECG Heart Rate 66  Resp 15  Weight 120.4 kg  Type of Weight Post-Dialysis  Oxygen Therapy  SpO2 100 %  O2 Device Room Air  Patient Activity (if Appropriate) In bed  Pulse Oximetry Type Continuous  Oximetry Probe Site Changed No  During Treatment Monitoring  Blood Flow Rate (mL/min) 0 mL/min  Arterial Pressure (mmHg) 27.47 mmHg  Venous Pressure (mmHg) -31.91 mmHg  TMP (mmHg) 14.14 mmHg  Ultrafiltration Rate (mL/min) 914 mL/min  Dialysate Flow Rate (mL/min) 299 ml/min  Dialysate Potassium Concentration 3  Dialysate Calcium Concentration 2.5  Duration of HD Treatment -hour(s) 2.5 hour(s)  Cumulative Fluid Removed (mL) per Treatment  2000.12  Post Treatment  Dialyzer Clearance Lightly streaked  Hemodialysis Intake (mL) 0 mL  Liters Processed 60  Fluid Removed (mL) 2000 mL  Tolerated HD Treatment Yes  Hemodialysis Catheter Right Internal jugular Double lumen Permanent (Tunneled)  Placement Date/Time: 09/22/24 1224   Serial / Lot #: [id number]  Expiration Date: 08/19/28  Time Out: Correct patient;Correct site;Correct procedure  Maximum sterile barrier precautions: Hand hygiene;Cap;Mask;Sterile gown;Sterile gloves;Large sterile ...  Site Condition No complications  Blue Lumen Status Flushed;Heparin  locked  Red Lumen Status Flushed;Heparin  locked  Catheter fill solution Heparin  1000 units/ml  Catheter fill  volume (Arterial) 1.9 cc  Catheter fill volume (Venous) 1.9  Dressing Type Transparent  Dressing Status Antimicrobial disc/dressing in place  Drainage Description None  Dressing Change Due 11/13/24  Post treatment catheter status Capped and Clamped

## 2024-11-06 NOTE — Progress Notes (Signed)
 Patient off unit for HD

## 2024-11-06 NOTE — TOC Transition Note (Signed)
 Transition of Care [facility]) - Discharge Note Rayfield Gobble RN,BSN Inpatient Care Management Unit 4NP (Non Trauma)- RN Case Manager See Treatment Team for direct Phone #   Patient Details  Name: Brendan [name]. MRN: [id number] Date of Birth: Nov 04, 1956  Transition of Care [facility]) CM/SW Contact:  Gobble Rayfield Hurst, RN Phone Number: 11/06/2024, 11:47 AM   Clinical Narrative:    Recommendations per therapy for INPT rehab, [facility] INPT rehab liaison has spoken with pt and wife who have declined INPT rehab and voiced they prefer to return home w/ HH.   CM also spoke with pt at bedside- confirmed pt is active with [facility] prior to admission- [facility] for PTOT. Pt voiced he would like to continue with [facility] for [facility] needs.  Pt states he has needed DME at home- no new DME needs noted.   Wife will transport home, Per renal plan is for pt to have HD here this afternoon, Pt medically stable for discharge per MD and may transition home after HD later today.   Pt already set up with outpt HD on a MTTF schedule per renal navigator note.   Call made to [facility] liaison to confirm resumption of [facility] services- they will follow for new orders- MD aware that pt needs new HH orders placed for HHRNPT/OT prior to discharge.   No further IP CM needs noted.    Final next level of care: Home w Home Health Services Barriers to Discharge: No Barriers Identified   Patient Goals and CMS Choice Patient states their goals for this hospitalization and ongoing recovery are:: return home w/ [facility]   Choice offered to / list presented to : Patient, Spouse      Discharge Placement                 Home w/ [facility]      Discharge Plan and Services Additional resources added to the After Visit Summary for     Discharge Planning Services: CM Consult Post Acute Care Choice: Home Health, Resumption of Svcs/PTA Provider          DME Arranged: N/A DME Agency: NA       HH Arranged: RN, PT, OT HH Agency: [facility] Date HH Agency Contacted: 11/06/24 Time HH Agency Contacted: 1130 Representative spoke with at [facility] Agency: [facility]  Social Drivers of Health (SDOH) Interventions SDOH Screenings   Food Insecurity: No Food Insecurity (11/04/2024)  Housing: Low Risk (11/04/2024)  Transportation Needs: No Transportation Needs (11/04/2024)  Utilities: At Risk (09/19/2024)  Financial Resource Strain: Low Risk (01/08/2023)  Social Connections: Socially Integrated (11/04/2024)  Tobacco Use: Low Risk (11/03/2024)     Readmission Risk Interventions    11/06/2024   11:47 AM 09/01/2024   11:34 AM  Readmission Risk Prevention Plan  Transportation Screening Complete Complete  Home Care Screening  Complete  Medication Review (RN CM)  Referral to Pharmacy  HRI or Home Care Consult Complete   Social Work Consult for Recovery Care Planning/Counseling Complete   Palliative Care Screening Not Applicable   Medication Review [facility]) Complete

## 2024-11-06 NOTE — Progress Notes (Signed)
 Inpatient Rehab Admissions Coordinator:    I met with Pt. To discuss potential CIR admit. Pt. And wife prefer that he d/c directly home with [facility] services. I have notified TOC and will sign off.   Leita Kleine, MS, CCC-SLP Rehab Admissions Coordinator  [phone] (celll) [phone] (office)

## 2024-11-06 NOTE — Progress Notes (Signed)
 OT Cancellation Note  Patient Details Name: Brendan [name]. MRN: [id number] DOB: 1956-07-22   Cancelled Treatment:    Reason Eval/Treat Not Completed: Patient declined, no reason specified Attempted OT session though pt reports I just sat down in recliner; declined to participate with OT. Will follow up as able  Mliss Fish 11/06/2024, 10:44 AM

## 2024-11-06 NOTE — Progress Notes (Signed)
 Transition of Care [facility]) - CAGE-AID Screening   Patient Details  Name: Brendan [name]. MRN: [id number] Date of Birth: 29-Nov-1955  Transition of Care [facility]) CM/SW Contact:    Bernardino Mayotte, RN Phone Number: 11/06/2024, 6:25 AM   Clinical Narrative:  Patient reports some alcohol use, denies illicit substances. Resources not given at this time.  CAGE-AID Screening:    Have You Ever Felt You Ought to Cut Down on Your Drinking or Drug Use?: No Have People Annoyed You By Critizing Your Drinking Or Drug Use?: No Have You Felt Bad Or Guilty About Your Drinking Or Drug Use?: No Have You Ever Had a Drink or Used Drugs First Thing In The Morning to Steady Your Nerves or to Get Rid of a Hangover?: No CAGE-AID Score: 0  Substance Abuse Education Offered: No

## 2024-11-06 NOTE — Progress Notes (Addendum)
 [facility] Progress Note  Subjective:  Seen in room, no c/o's today Creat up again to 3.7, min UOP recorded (200 cc / d)  Presentation summary: 68 y.o. year-old w/ PMH as below who presented to ED on 12/16 brought by EMS from home after his wife found him unconscious on the floor with a large hematoma in the back of the head.  Also was weak on his right side. In ED BP was 150/80, HR 78, RR 21, temp 97, 98% sat on room air.  Labs showed K+ 3.6, BUN 14, creatinine 2.4, calcium 8.6, albumin  3.5 Hgb 11, WBC 17K.  UA showed small hemoglobin and LE, rare bacteria, 11-20 WBCs.  Head CT was concerning for mild contusions along the inferior aspect of the both frontal lobes and the right frontal convexity most consistent with meningioma.  Neurosurgery and neurology were consulted and patient was given IV Keppra  load, Benadryl , Zofran , and Reglan  were given also.  Patient was admitted for fall with TBI, questionable CVA.  Patient was admitted.  We are asked to see for renal failure. Pt seen in room. He was recently started on dialysis in early November 2025 when he was admitted and had significant severe volume overload which did not respond to diuresis.  He was discharged and has been getting dialysis at [facility] on a MTTS schedule.   Vitals:   11/06/24 0259 11/06/24 0405 11/06/24 0755 11/06/24 1126  BP: 120/75  133/66 124/64  Pulse: 69  69 67  Resp: 17  13 11   Temp: 98.3 F (36.8 C)  (!) 97.5 F (36.4 C) 97.6 F (36.4 C)  TempSrc: Oral  Oral Oral  SpO2: 94%  97% 96%  Weight:  127.7 kg    Height:        Exam: Gen alert, no distress, up in chair Sclera anicteric, throat clear No jvd or bruits Chest clear bilat to bases RRR no MRG Abd soft obese ntnd no mass or ascites +bs Ext trace pretib edema Neuro is alert, Ox 3 , nf    RIJ TDC intact   Home bp meds: Norvasc  Coreg  Torsemide    Date                             Creat               eGFR (ml/min) 2008                             1.3- 1.9 2009-2010                   1.6- 2.3 2018                            1.5 2021                            2.1- 2.7 2023                            2.1- 3.4 March- may 2025        2.8- 3.1            21- 23 ml/min, IV  Oct 10-16                    3.5- 3.8            16- 18 ml/min, IV Oct 28- Sep 22, 2024 3.5- 4.1   15- 18 ml/min, IV / V 10/22/24                      2.45                  12/16                           2.35 11/04/24                      2.51        OP HD: Gen alert, no distress Sclera anicteric, throat clear and a bit dry No jvd or bruits Chest clear bilat to bases RRR no MRG Abd soft obese ntnd no mass or ascites +bs Ext no LE or UE edema, no other edema Neuro is alert, Ox 3 , nf    RIJ TDC intact   Home bp meds: Norvasc  Coreg  Torsemide    Date                             Creat               eGFR (ml/min) 2008                            1.3- 1.9 2009-2010                   2.5- 2.3 2018                            1.5 2021                            2.1- 2.7 2023                            2.1- 3.4 March- may 2025        2.8- 3.1            21- 23 ml/min, IV                      Oct 10-16                    3.5- 3.8            16- 18 ml/min, IV Oct 28- Sep 22, 2024  3.5- 4.1  15- 18 ml/min, IV / V 10/22/24                      2.45                  12/16                           2.35 11/04/24                      2.51  OP HD:  [facility] MTTF 3h   B400    125kg   TDC   Hep 5000 Calcitriol  0.66mcg with HD Micera 100mcg Q 2 weeks     Assessment/ Plan: Renal failure: AKI' on dialysis. Pt has had cardiorenal syndrome w/ advancing renal failure over the last 10-15 years. 6 wks ago was started on HD due to volume overload not responding to diuresis. The creatinine then was 3.5- 4.1 range. Now he is admitted after falling at home. Creatinine is unusually low at 2.4, and there is the possibility that he has recovered function since  starting dialysis. Will held off on dialysis for 2 days, but his creatinine continues to rise (3.7 today, max creat when started on HD was 4.1), and UOP is low so doesn't look like he is recovering. Will continue HD.  Plan HD today to get back on schedule.  HTN: BP's are stable, getting coreg  12.5 bid.  Volume: plan is for 2 L as tolerated today, looks euvolemic, trace LE edema.  Anemia of esrd: Hb 11-12, follow.     Myer Fret MD  [facility] 11/06/2024, 12:01 PM  Recent Labs  Lab 11/03/24 2002 11/03/24 2017 11/04/24 0223 11/05/24 0847 11/06/24 0638  HGB 10.8* 11.9* 10.6*  --   --   ALBUMIN  3.5  --   --   --   --   CALCIUM 8.6*  --  8.5* 8.9 8.7*  CREATININE 2.35* 2.40* 2.51* 3.16* 3.72*  K 3.7 3.6 3.4* 3.7 4.0   No results for input(s): IRON , TIBC, FERRITIN in the last 168 hours. Inpatient medications:  carvedilol   12.5 mg Oral BID WC   Chlorhexidine  Gluconate Cloth  6 each Topical Daily   [START ON 11/07/2024] Chlorhexidine  Gluconate Cloth  6 each Topical Q0600   feeding supplement (NEPRO CARB STEADY)  237 mL Oral BID BM   insulin  aspart  0-6 Units Subcutaneous Q4H   insulin  glargine  12 Units Subcutaneous QHS   levETIRAcetam   500 mg Oral Daily   levothyroxine   125 mcg Oral q AM   simvastatin   20 mg Oral QPM   sodium chloride  flush  3 mL Intravenous Q12H     acetaminophen  **OR** acetaminophen  (TYLENOL ) oral liquid 160 mg/5 mL **OR** acetaminophen , fentaNYL  (SUBLIMAZE ) injection, labetalol , prochlorperazine , senna, sodium chloride  flush

## 2024-11-06 NOTE — Progress Notes (Signed)
 Order to discharge patient home. Family at bedside. Discharge instructions/AVS given to and reviewed with patient. Education provided as needed. Patient verbalized understanding. HD cath to be left in place for patient for dialysis outpatient. Personal belongings sent home with patient. Home via private vehicle.

## 2024-11-06 NOTE — Discharge Planning (Signed)
 [facility] Patient Discharge Orders - [facility] CLINIC: [facility]  Patient's name: Brendan [name]. Admit/DC Dates: 11/03/2024 - 11/06/2024  DISCHARGE DIAGNOSES: Fall, head injury -> R frontal lobe contusion on imaging Incidental meningioma -> no treatment needed  AKI - HD held for few days, but Cr rising so HD resumed.  HD ORDER CHANGES: Heparin  change: no EDW Change: no Bath Change: no  ANEMIA MANAGEMENT: Aranesp: Given: no    ESA dose for discharge: Per protocol   IV Iron  dose at discharge: Per protocol Transfusion: Given: no  BONE/MINERAL MEDICATIONS: Hectorol/Calcitriol change: no Sensipar/Parsabiv change: no  ACCESS INTERVENTION/CHANGE: no Details:   RECENT LABS: Recent Labs  Lab 11/03/24 2002 11/03/24 2017 11/04/24 0223 11/05/24 0847 11/06/24 0638  HGB 10.8*   < > 10.6*  --   --   NA 139   < > 139   < > 140  K 3.7   < > 3.4*   < > 4.0  CALCIUM 8.6*  --  8.5*   < > 8.7*  ALBUMIN  3.5  --   --   --   --    < > = values in this interval not displayed.   IV ANTIBIOTICS: no Details:  OTHER ANTICOAGULATION: no Details:  OTHER/APPTS/LAB ORDERS:   D/C Meds to be reconciled by nurse after every discharge.  Completed By: Izetta Boehringer, PA-C [facility] Pager [phone]   Reviewed by: MD:______ RN_______

## 2024-11-06 NOTE — Progress Notes (Signed)
 Patient returned to unit.

## 2024-11-06 NOTE — Plan of Care (Signed)
 " Problem: Education: Goal: Ability to describe self-care measures that may prevent or decrease complications (Diabetes Survival Skills Education) will improve 11/06/2024 0014 by Marvis Kenneth SAILOR, RN Outcome: Progressing 11/05/2024 2232 by Marvis Kenneth SAILOR, RN Outcome: Progressing Goal: Individualized Educational Video(s) 11/06/2024 0014 by Marvis Kenneth SAILOR, RN Outcome: Progressing 11/05/2024 2232 by Marvis Kenneth SAILOR, RN Outcome: Progressing   Problem: Coping: Goal: Ability to adjust to condition or change in health will improve 11/06/2024 0014 by Marvis Kenneth SAILOR, RN Outcome: Progressing 11/05/2024 2232 by Marvis Kenneth SAILOR, RN Outcome: Progressing   Problem: Fluid Volume: Goal: Ability to maintain a balanced intake and output will improve 11/06/2024 0014 by Marvis Kenneth SAILOR, RN Outcome: Progressing 11/05/2024 2232 by Marvis Kenneth SAILOR, RN Outcome: Progressing   Problem: Health Behavior/Discharge Planning: Goal: Ability to identify and utilize available resources and services will improve 11/06/2024 0014 by Marvis Kenneth SAILOR, RN Outcome: Progressing 11/05/2024 2232 by Marvis Kenneth SAILOR, RN Outcome: Progressing Goal: Ability to manage health-related needs will improve 11/06/2024 0014 by Marvis Kenneth SAILOR, RN Outcome: Progressing 11/05/2024 2232 by Marvis Kenneth SAILOR, RN Outcome: Progressing   Problem: Metabolic: Goal: Ability to maintain appropriate glucose levels will improve 11/06/2024 0014 by Marvis Kenneth SAILOR, RN Outcome: Progressing 11/05/2024 2232 by Marvis Kenneth SAILOR, RN Outcome: Progressing   Problem: Nutritional: Goal: Maintenance of adequate nutrition will improve 11/06/2024 0014 by Marvis Kenneth SAILOR, RN Outcome: Progressing 11/05/2024 2232 by Marvis Kenneth SAILOR, RN Outcome: Progressing Goal: Progress toward achieving an optimal weight will improve 11/06/2024 0014 by Marvis Kenneth SAILOR, RN Outcome: Progressing 11/05/2024 2232 by  Marvis Kenneth SAILOR, RN Outcome: Progressing   Problem: Skin Integrity: Goal: Risk for impaired skin integrity will decrease 11/06/2024 0014 by Marvis Kenneth SAILOR, RN Outcome: Progressing 11/05/2024 2232 by Marvis Kenneth SAILOR, RN Outcome: Progressing   Problem: Tissue Perfusion: Goal: Adequacy of tissue perfusion will improve 11/06/2024 0014 by Marvis Kenneth SAILOR, RN Outcome: Progressing 11/05/2024 2232 by Marvis Kenneth SAILOR, RN Outcome: Progressing   Problem: Education: Goal: Knowledge of disease or condition will improve 11/06/2024 0014 by Marvis Kenneth SAILOR, RN Outcome: Progressing 11/05/2024 2232 by Marvis Kenneth SAILOR, RN Outcome: Progressing Goal: Knowledge of secondary prevention will improve (MUST DOCUMENT ALL) 11/06/2024 0014 by Marvis Kenneth SAILOR, RN Outcome: Progressing 11/05/2024 2232 by Marvis Kenneth SAILOR, RN Outcome: Progressing Goal: Knowledge of patient specific risk factors will improve (DELETE if not current risk factor) 11/06/2024 0014 by Marvis Kenneth SAILOR, RN Outcome: Progressing 11/05/2024 2232 by Marvis Kenneth SAILOR, RN Outcome: Progressing   Problem: Ischemic Stroke/TIA Tissue Perfusion: Goal: Complications of ischemic stroke/TIA will be minimized 11/06/2024 0014 by Marvis Kenneth SAILOR, RN Outcome: Progressing 11/05/2024 2232 by Marvis Kenneth SAILOR, RN Outcome: Progressing   Problem: Coping: Goal: Will verbalize positive feelings about self 11/06/2024 0014 by Marvis Kenneth SAILOR, RN Outcome: Progressing 11/05/2024 2232 by Marvis Kenneth SAILOR, RN Outcome: Progressing Goal: Will identify appropriate support needs 11/06/2024 0014 by Marvis Kenneth SAILOR, RN Outcome: Progressing 11/05/2024 2232 by Marvis Kenneth SAILOR, RN Outcome: Progressing   Problem: Health Behavior/Discharge Planning: Goal: Ability to manage health-related needs will improve 11/06/2024 0014 by Marvis Kenneth SAILOR, RN Outcome: Progressing 11/05/2024 2232 by Marvis Kenneth SAILOR,  RN Outcome: Progressing Goal: Goals will be collaboratively established with patient/family 11/06/2024 0014 by Marvis Kenneth SAILOR, RN Outcome: Progressing 11/05/2024 2232 by Marvis Kenneth SAILOR, RN Outcome: Progressing   Problem: Self-Care: Goal: Ability to participate in self-care as condition permits will improve 11/06/2024 0014 by Marvis Kenneth SAILOR, RN Outcome: Progressing  11/05/2024 2232 by Marvis Kenneth SAILOR, RN Outcome: Progressing Goal: Verbalization of feelings and concerns over difficulty with self-care will improve 11/06/2024 0014 by Marvis Kenneth SAILOR, RN Outcome: Progressing 11/05/2024 2232 by Marvis Kenneth SAILOR, RN Outcome: Progressing Goal: Ability to communicate needs accurately will improve 11/06/2024 0014 by Marvis Kenneth SAILOR, RN Outcome: Progressing 11/05/2024 2232 by Marvis Kenneth SAILOR, RN Outcome: Progressing   Problem: Nutrition: Goal: Risk of aspiration will decrease 11/06/2024 0014 by Marvis Kenneth SAILOR, RN Outcome: Progressing 11/05/2024 2232 by Marvis Kenneth SAILOR, RN Outcome: Progressing Goal: Dietary intake will improve 11/06/2024 0014 by Marvis Kenneth SAILOR, RN Outcome: Progressing 11/05/2024 2232 by Marvis Kenneth SAILOR, RN Outcome: Progressing   Problem: Education: Goal: Knowledge of General Education information will improve Description: Including pain rating scale, medication(s)/side effects and non-pharmacologic comfort measures 11/06/2024 0014 by Marvis Kenneth SAILOR, RN Outcome: Progressing 11/05/2024 2232 by Marvis Kenneth SAILOR, RN Outcome: Progressing   Problem: Health Behavior/Discharge Planning: Goal: Ability to manage health-related needs will improve 11/06/2024 0014 by Marvis Kenneth SAILOR, RN Outcome: Progressing 11/05/2024 2232 by Marvis Kenneth SAILOR, RN Outcome: Progressing   Problem: Clinical Measurements: Goal: Ability to maintain clinical measurements within normal limits will improve 11/06/2024 0014 by Marvis Kenneth SAILOR,  RN Outcome: Progressing 11/05/2024 2232 by Marvis Kenneth SAILOR, RN Outcome: Progressing Goal: Will remain free from infection 11/06/2024 0014 by Marvis Kenneth SAILOR, RN Outcome: Progressing 11/05/2024 2232 by Marvis Kenneth SAILOR, RN Outcome: Progressing Goal: Diagnostic test results will improve 11/06/2024 0014 by Marvis Kenneth SAILOR, RN Outcome: Progressing 11/05/2024 2232 by Marvis Kenneth SAILOR, RN Outcome: Progressing Goal: Respiratory complications will improve 11/06/2024 0014 by Marvis Kenneth SAILOR, RN Outcome: Progressing 11/05/2024 2232 by Marvis Kenneth SAILOR, RN Outcome: Progressing Goal: Cardiovascular complication will be avoided 11/06/2024 0014 by Marvis Kenneth SAILOR, RN Outcome: Progressing 11/05/2024 2232 by Marvis Kenneth SAILOR, RN Outcome: Progressing   Problem: Activity: Goal: Risk for activity intolerance will decrease 11/06/2024 0014 by Marvis Kenneth SAILOR, RN Outcome: Progressing 11/05/2024 2232 by Marvis Kenneth SAILOR, RN Outcome: Progressing   Problem: Nutrition: Goal: Adequate nutrition will be maintained 11/06/2024 0014 by Marvis Kenneth SAILOR, RN Outcome: Progressing 11/05/2024 2232 by Marvis Kenneth SAILOR, RN Outcome: Progressing   Problem: Coping: Goal: Level of anxiety will decrease 11/06/2024 0014 by Marvis Kenneth SAILOR, RN Outcome: Progressing 11/05/2024 2232 by Marvis Kenneth SAILOR, RN Outcome: Progressing   Problem: Elimination: Goal: Will not experience complications related to bowel motility 11/06/2024 0014 by Marvis Kenneth SAILOR, RN Outcome: Progressing 11/05/2024 2232 by Marvis Kenneth SAILOR, RN Outcome: Progressing Goal: Will not experience complications related to urinary retention 11/06/2024 0014 by Marvis Kenneth SAILOR, RN Outcome: Progressing 11/05/2024 2232 by Marvis Kenneth SAILOR, RN Outcome: Progressing   Problem: Pain Managment: Goal: General experience of comfort will improve and/or be controlled 11/06/2024 0014 by Marvis Kenneth SAILOR,  RN Outcome: Progressing 11/05/2024 2232 by Marvis Kenneth SAILOR, RN Outcome: Progressing   Problem: Safety: Goal: Ability to remain free from injury will improve 11/06/2024 0014 by Marvis Kenneth SAILOR, RN Outcome: Progressing 11/05/2024 2232 by Marvis Kenneth SAILOR, RN Outcome: Progressing   Problem: Skin Integrity: Goal: Risk for impaired skin integrity will decrease 11/06/2024 0014 by Marvis Kenneth SAILOR, RN Outcome: Progressing 11/05/2024 2232 by Marvis Kenneth SAILOR, RN Outcome: Progressing   "

## 2024-11-06 NOTE — Discharge Instructions (Signed)
-   Follow up with neurosurgery in the clinic - Avoid any aspirin or other NSAIDs (including ibuprofen, Advil, Aleve etc)

## 2024-11-06 NOTE — Progress Notes (Signed)
 D/C order noted. Contacted TCU at [facility] RN to be advised of pt's d/c today and that pt should resume care on Monday.   [name] Dialysis Navigator [phone]

## 2024-11-06 NOTE — Progress Notes (Signed)
 Physical Therapy Treatment Patient Details Name: Brendan [name]. MRN: [id number] DOB: 1955/12/11 Today's Date: 11/06/2024   History of Present Illness 68 yo M adm 11/03/24 from home, found unconscious after fall. MRI; large scalp contusion, trace SAH anterior frontal lobes, and small hemorrhagic interventricular contusions, Rt frontal meningioma. PMH - ESRD on HD, necrotizing fasciitis, DM2, HTN, hypothyroidism, OSA, obesity, CKD, CHF, glaucoma, gynecomastia, HLD, vertigo, obesity, peripheral neuropathy, prostate CA, sickle cell trait    PT Comments  Pt with flat affect and reports wife works but can assist at home. Pt with improved transfers, gait and functional mobility who is progressing well and prefers D/C home which is appropriate given current function. Encouraged OOB to chair and up with staff as well as continued standing trials and HEP. Will continue to follow.     If plan is discharge home, recommend the following: A little help with walking and/or transfers;A little help with bathing/dressing/bathroom   Can travel by private vehicle        Equipment Recommendations  None recommended by PT    Recommendations for Other Services       Precautions / Restrictions Precautions Precautions: Fall Recall of Precautions/Restrictions: Intact     Mobility  Bed Mobility   Bed Mobility: Supine to Sit     Supine to sit: Modified independent (Device/Increase time), HOB elevated, Used rails     General bed mobility comments: HOb 20 degrees with pt able to pivot to left side with use fo rail without assist    Transfers Overall transfer level: Needs assistance   Transfers: Sit to/from Stand Sit to Stand: Supervision           General transfer comment: pt able to rise from bed and recliner without assist with reliance on UB support. Pt stood from recliner x 5 trials with increased time and cues to position at midline of surface    Ambulation/Gait Ambulation/Gait  assistance: Contact guard assist Gait Distance (Feet): 150 Feet Assistive device: Rolling walker (2 wheels) Gait Pattern/deviations: Step-through pattern, Decreased stride length, Trunk flexed   Gait velocity interpretation: 1.31 - 2.62 ft/sec, indicative of limited community ambulator   General Gait Details: pt with good control of RW, cues for posture and direction, pt denied further gait or attempting stairs   Stairs             Wheelchair Mobility     Tilt Bed    Modified Rankin (Stroke Patients Only)       Balance Overall balance assessment: Needs assistance Sitting-balance support: No upper extremity supported, Feet supported Sitting balance-Leahy Scale: Good     Standing balance support: During functional activity, Bilateral upper extremity supported, Reliant on assistive device for balance Standing balance-Leahy Scale: Poor Standing balance comment: RW for gait                            Communication Communication Communication: No apparent difficulties  Cognition Arousal: Alert Behavior During Therapy: Flat affect   PT - Cognitive impairments: Problem solving                         Following commands: Intact Following commands impaired: Only follows one step commands consistently    Cueing Cueing Techniques: Verbal cues  Exercises General Exercises - Lower Extremity Long Arc Quad: AROM, Both, 15 reps, Seated, Strengthening Hip Flexion/Marching: AROM, Both, Seated, Strengthening, 15 reps    General Comments  Pertinent Vitals/Pain Pain Assessment Pain Assessment: No/denies pain    Home Living                          Prior Function            PT Goals (current goals can now be found in the care plan section) Progress towards PT goals: Progressing toward goals    Frequency    Min 2X/week      PT Plan      Co-evaluation              AM-PAC PT 6 Clicks Mobility   Outcome  Measure  Help needed turning from your back to your side while in a flat bed without using bedrails?: None Help needed moving from lying on your back to sitting on the side of a flat bed without using bedrails?: None Help needed moving to and from a bed to a chair (including a wheelchair)?: A Little Help needed standing up from a chair using your arms (e.g., wheelchair or bedside chair)?: A Little Help needed to walk in hospital room?: A Little Help needed climbing 3-5 steps with a railing? : A Lot 6 Click Score: 19    End of Session Equipment Utilized During Treatment: Gait belt Activity Tolerance: Patient tolerated treatment well Patient left: in chair;with call bell/phone within reach;with chair alarm set Nurse Communication: Mobility status PT Visit Diagnosis: Other abnormalities of gait and mobility (R26.89);Muscle weakness (generalized) (M62.81)     Time: 8994-8976 PT Time Calculation (min) (ACUTE ONLY): 18 min  Charges:    $Gait Training: 8-22 mins PT General Charges $$ ACUTE PT VISIT: 1 Visit                     Lenoard SQUIBB, PT Acute Rehabilitation Services Office: [phone]    Lenoard NOVAK [place] Kirk 11/06/2024, 12:30 PM

## 2024-11-06 NOTE — Discharge Summary (Signed)
 " Physician Discharge Summary   Patient: Brendan [name]. MRN: [id number] DOB: 11/23/1955  Admit date:     11/03/2024  Discharge date: 11/06/2024  Discharge Physician: Casimer Dare   PCP: Rexanne Ingle, MD   Recommendations at discharge:    Follow up with neurosurgery Avoid NSAIDs  Discharge Diagnoses: Principal Problem:   TBI (traumatic brain injury) (HCC) Active Problems:   ESRD on dialysis (HCC)   Insulin -requiring or dependent type II diabetes mellitus (HCC)   Hypertension associated with diabetes (HCC)   Hypothyroidism   Obstructive sleep apnea   Hyperlipidemia associated with type 2 diabetes mellitus [facility])   Hospital Course: 68 year old male with PMH of DM2, HTN, HLD, poor thyroidism, OSA on CPAP, Fournier's gangrene (October 2025), CKD stage V recently started on HD, who presented to the ED after he was found unconscious on the floor. Per patient he had lost his balance and fell down 3 steps and hit the back of his head after he returned home from dialysis. CT head showed right frontal extra-axial mass, most consistent meningioma, posterior scalp and left prefrontal scalp hematomas. Neurosurgery and neurology were consulted.  He was initially started Keppra  for seizure prophylaxis, but neurology did not recommend long-term AEDs.  MRI brain was also completed, and no further testing recommended by neurosurgery.  His HD was held to see if his renal function would improve, but his creatinine started to rise and urine output decreased 0.4 he received HD session prior to discharge.  Patient did not want to consider inpatient rehab, and is being discharged with family and home health  Assessment and Plan:  1. Fall with TBI;  - Found down at home with LOC - CT showed mild contusion along inferior frontal lobes and right frontal meningioma. Repeat CT head showed stable acute hemorrhage along right inferior frontal lobe, stable acute subarachnoid hemorrhage overlying right frontal lobe, and  scalp hematomas.  MRI brain showed small hemorrhagic contusions, trace regional subarachnoid hemorrhage, trace intraventricular hemorrhage, large posterior scalp contusion and the meningioma - Appreciate neurology and neurosurgery assessment and recommendations  - he received Keppra  while admitted for seizure prophylaxis renally dosed.  Neurology did not recommend long-term AEDs and it was discontinued on discharge.  He received 4 doses in total - he has flat affect, but mental status at baseline - follow up with neurosurgery as needed    3. CKD stage V, recently started HD as outpatient  - seen by nephrology and he was monitored off of dialysis to determine if there was any renal recovery, but his creatinine continued to rise and urine output was low and therefore HD was resumed again.  He received HD prior to discharge   3. Type II DM  - A1c was 8.4% in October 2025  - resumed home meds    4. Hypertension  - BP initially was low and home BP meds were held, blood pressure started to increase and home meds were resumed   5. HLD  - Continue simvastatin      6. Hypothyroidism  - Levothyroxine      7. OSA  - CPAP while sleeping         Consultants: Neurology, neurosurgery, nephrology Procedures performed: None  Disposition: Home Diet recommendation:  Renal diet DISCHARGE MEDICATION: Allergies as of 11/06/2024       Reactions   Ace Inhibitors Cough   Maxidex  [dexamethasone ] Other (See Comments)   Sexual dysfunction   Verapamil Other (See Comments)   constipation  Medication List     TAKE these medications    acetaminophen  500 MG tablet Commonly known as: TYLENOL  Take 500 mg by mouth every 6 (six) hours as needed for mild pain.   amLODipine  5 MG tablet Commonly known as: NORVASC  Take 1 tablet (5 mg total) by mouth daily.   Bimzelx 160 MG/ML prefilled syringe Generic drug: bimekizumab-bkzx Inject 1 Syringe into the skin every 30 (thirty) days.    carvedilol  12.5 MG tablet Commonly known as: COREG  Take 1 tablet (12.5 mg total) by mouth 2 (two) times daily with a meal. Follow your blood pressure outpatient and get refills and adjustments with your PCP outpatient   clobetasol  ointment 0.05 % Commonly known as: TEMOVATE  Apply topically daily before sleeping.   dorzolamide  2 % ophthalmic solution Commonly known as: TRUSOPT  Place 1 drop into both eyes daily.   FreeStyle Libre 3 Sensor Misc Apply to upper back of arm and change ever 14 days.   FreeStyle Libre 3 Plus Sensor Misc Apply to upper back of arm. Change every 15 days.   HumaLOG  KwikPen 200 UNIT/ML KwikPen Generic drug: insulin  lispro Inject 0-15 Units into the skin 3 (three) times daily with meals. CBG < 70: treat low blood sugar CBG 70 - 120: 0 units  CBG 121 - 150: 2 units  CBG 151 - 200: 3 units  CBG 201 - 250: 5 units  CBG 251 - 300: 8 units  CBG 301 - 350: 11 units  CBG 351 - 400: 15 units  CBG >400: Call MD What changed: Another medication with the same name was removed. Continue taking this medication, and follow the directions you see here.   Insupen Pen Needles 32G X 4 MM Misc Generic drug: Insulin  Pen Needle Use to inject insulin  4 times daily   Insupen Pen Needles 32G X 4 MM Misc Generic drug: Insulin  Pen Needle Use to inject insulin  up to 4 times daily.   Lantus  SoloStar 100 UNIT/ML Solostar Pen Generic drug: insulin  glargine Inject 15 Units into the skin daily. Follow up with your PCP outpatient for further adjustment to your regimen What changed: how much to take   levothyroxine  125 MCG tablet Commonly known as: SYNTHROID  Take 1 tablet (125 mcg total) by mouth every morning on an empty stomach   Mounjaro  15 MG/0.5ML Pen Generic drug: tirzepatide  Inject 15 mg into the skin once a week.   simvastatin  20 MG tablet Commonly known as: ZOCOR  Take 1 tablet (20 mg total) by mouth every evening.   timolol  0.5 % ophthalmic solution Commonly  known as: BETIMOL  Place 1 drop into both eyes daily.   torsemide  100 MG tablet Commonly known as: DEMADEX  Take 1 tablet (100 mg total) by mouth in the morning except on dialysis days        Follow-up Information     [facility]) Follow up.   Specialty: Home Health Services Why: HH services to resume- they will contact you to schedule Contact information: [address] [place]  [address] [phone]               Discharge Exam: Fredricka Weights   11/03/24 2002 11/06/24 0405  Weight: 127.7 kg 127.7 kg   Physical Exam Vitals and nursing note reviewed.  Constitutional:      General: He is not in acute distress. Cardiovascular:     Rate and Rhythm: Normal rate.  Pulmonary:     Effort: No respiratory  distress.  Abdominal:     General: There is no distension.     Tenderness: There is no abdominal tenderness.  Musculoskeletal:     Right lower leg: No edema.     Left lower leg: No edema.  Neurological:     Comments: 5/5 strength in bilateral upper and lower extremities.  Oriented x 3      Condition at discharge: stable  The results of significant diagnostics from this hospitalization (including imaging, microbiology, ancillary and laboratory) are listed below for reference.   Imaging Studies: CT Head Wo Contrast Result Date: 11/04/2024 EXAM: CT HEAD WITHOUT CONTRAST 11/04/2024 04:19:35 AM TECHNIQUE: CT of the head was performed without the administration of intravenous contrast. Automated exposure control, iterative reconstruction, and/or weight based adjustment of the mA/kV was utilized to reduce the radiation dose to as low as reasonably achievable. COMPARISON: CT of the head and MRI of the head dated 11/03/2024. CLINICAL HISTORY: head trauma, 8 hour scan. FINDINGS: BRAIN AND VENTRICLES: Focal hyperdensity within right pons, unchanged. Stable acute hemorrhage along right inferior frontal lobe. Stable  acute subarachnoid hemorrhage along right frontal lobe. Right frontal convexity extra-axial mass lesion measuring 3.1 x 2.1 x 2.6 cm, likely meningioma. Stable associated local cortical mass effect. Moderate patchy and ill-defined hypoattenuation within cerebral white matter, compatible with chronic small vessel ischemic disease. Persistent pneumocephalus. No evidence of acute infarct. No hydrocephalus. No midline shift. Moderate atherosclerotic calcifications. ORBITS: No acute abnormality. SINUSES: Mucosal thickening within bilateral maxillary sinuses. SOFT TISSUES AND SKULL: Left frontal and left parietooccipital scalp hematomas. No skull fracture. IMPRESSION: 1. Stable acute hemorrhage along right inferior frontal lobe and stable acute subarachnoid hemorrhage along right frontal lobe. 2. Right frontal convexity extra-axial mass lesion, likely meningioma, with stable associated local cortical mass effect. 3. Moderate patchy and ill-defined hypoattenuation within cerebral white matter, compatible with chronic small vessel ischemic disease. 4. Left frontal and left parietooccipital scalp hematomas. Electronically signed by: Evalene Coho MD 11/04/2024 05:02 AM EST RP Workstation: [id number]   MR Brain W and Wo Contrast Result Date: 11/04/2024 EXAM: MRI BRAIN WITH AND WITHOUT CONTRAST 11/03/2024 11:27:47 PM TECHNIQUE: Multiplanar multisequence MRI of the head/brain was performed with and without the administration of 10 mL Gadobutrol  1 mmol/mL IV solution. COMPARISON: CT from earlier the same day. CLINICAL HISTORY: fall, head injury, dizziness FINDINGS: LIMITATIONS/ARTIFACTS: Examination moderately to severely degraded by motion artifact. BRAIN AND VENTRICLES: No acute infarct. Small hemorrhagic contusions involving the right greater than left gyrus recti are seen. The largest hematoma on the right measures up to 2.1 cm (series 5, image 12). Mild localized edema without significant regional mass effect or  midline shift. Trace regional subarachnoid hemorrhage in this region. Probable trace extra axial hemorrhage along the anterior falx. Trace intraventricular hemorrhage with blood seen layering within the occipital horns noted as well (series 7, images 50, 42). Patchy T2/FLAIR hyperintensity involving the periventricular, deep, and subcortical white matter of the cerebral hemispheres, nonspecific, but most commonly related to chronic small vessel ischemic disease. Changes are moderate in nature. Mild patchy involvement of the pons. Enhancing extra-axial mass overlying the anterior right frontal convexity measures 4.0 x 2.3 x 4.3 cm (AP by craniocaudad by transverse), most characteristics of a meningioma. Lesion abuts but does not definitively invade the superior sagittal sinus anteriorly. No significant associated vasogenic edema or regional mass effect. No hydrocephalus. The sella is unremarkable. Normal flow voids. ORBITS: No acute abnormality. SINUSES: Scattered mucosal thickening seen throughout the paranasal sinuses. BONES AND SOFT TISSUES:  Normal bone marrow signal and enhancement. Large posterior scalp contusion, with additional smaller scalp contusion at the left forehead. Small left greater than right mastoid effusions, of definite significance. Imaged nasopharynx unremarkable. IMPRESSION: 1. Small hemorrhagic contusions involving the right greater than left gyrus [email or web address] localized edema without significant regional mass effect or midline shift. 2. Trace regional subarachnoid hemorrhage at the anterior frontal lobes bilaterally, with trace intraventricular hemorrhage within the occipital horns of both lateral ventricles. No hydrocephalus. 3. Large posterior scalp contusion, with additional smaller scalp contusion at the left forehead. 4. Enhancing 4.0 x 2.3 x 4.3 cm extra-axial mass overlying the anterior right frontal convexity, most characteristic of a meningioma. No significant surrounding vasogenic  edema or regional mass effect. Electronically signed by: Morene Hoard MD 11/04/2024 12:50 AM EST RP Workstation: [id number]   DG Pelvis Portable Result Date: 11/03/2024 EXAM: 1 or 2 VIEW(S) XRAY OF THE PELVIS 11/03/2024 09:12:00 PM COMPARISON: None available. CLINICAL HISTORY: Trauma FINDINGS: LIMITATIONS/ARTIFACTS: Marked underpenetration, particularly at the pelvis and hips. BONES AND JOINTS: No acute fracture. No malalignment. SOFT TISSUES: The soft tissues are unremarkable. IMPRESSION: 1. No evidence of acute traumatic injury. 2. Marked underpenetration, particularly at the pelvis and hips. Electronically signed by: Oneil Devonshire MD 11/03/2024 09:20 PM EST RP Workstation: [id number]   DG Chest Port 1 View Result Date: 11/03/2024 EXAM: 1 VIEW(S) XRAY OF THE CHEST 11/03/2024 09:12:00 PM COMPARISON: 09/15/2024 CLINICAL HISTORY: Trauma FINDINGS: LINES, TUBES AND DEVICES: Tunneled right IJ CVC in place with tip projecting over the superior cavoatrial junction. LUNGS AND PLEURA: Low lung volumes accentuate pulmonary vascularity. No focal consolidation. No pleural effusion. No pneumothorax. HEART AND MEDIASTINUM: Stable cardiomegaly. BONES AND SOFT TISSUES: No acute osseous abnormality. IMPRESSION: 1. No acute process. Electronically signed by: Oneil Devonshire MD 11/03/2024 09:19 PM EST RP Workstation: [id number]   CT Head Wo Contrast Addendum Date: 11/03/2024 ** ADDENDUM #1 ** ADDENDUM: Additionally there is increased attenuation noted along the inferior aspect of the frontal lobes bilaterally, consistent with mild contusions. This is commensurate with the patient's given injury. Findings were called to Dr. Yolande at the time of exam interpretation. ---------------------------------------------------- Electronically signed by: Oneil Devonshire MD 11/03/2024 09:05 PM EST RP Workstation: [id number]   Result Date: 11/03/2024 ** ORIGINAL REPORT ** EXAM: CT HEAD WITHOUT CONTRAST 11/03/2024 08:34:10 PM  TECHNIQUE: CT of the head was performed without the administration of intravenous contrast. Automated exposure control, iterative reconstruction, and/or weight based adjustment of the mA/kV was utilized to reduce the radiation dose to as low as reasonably achievable. COMPARISON: None available. CLINICAL HISTORY: found down, ams FINDINGS: BRAIN AND VENTRICLES: Tiny focus of increased attenuation is noted in the midportion of the pons, consistent with focal calcification. No other focal hemorrhage is noted. No evidence of acute infarct. 2.6 x 1.4 x 2.8 cm right frontal convexity extra-axial mass most consistent with meningioma. Moderate chronic microvascular ischemic change. ORBITS: No acute abnormality. SINUSES: Mucosal thickening in the maxillary and bilateral ethmoid sinuses. SOFT TISSUES AND SKULL: Midline posterior scalp and left prefrontal scalp hematomas. No skull fracture. IMPRESSION: 1. 2.6 x 1.4 x 2.8 cm right frontal convexity extra-axial mass most consistent with meningioma. 2. Midline posterior scalp and left prefrontal scalp hematomas. 3. Tiny focus of increased attenuation in the midportion of the pons, likely related to calcification. Electronically signed by: Oneil Devonshire MD 11/03/2024 08:56 PM EST RP Workstation: [id number]   CT Cervical Spine Wo Contrast Result Date: 11/03/2024 EXAM: CT CERVICAL SPINE WITHOUT CONTRAST 11/03/2024 08:34:10 PM TECHNIQUE: CT of  the cervical spine was performed without the administration of intravenous contrast. Multiplanar reformatted images are provided for review. Automated exposure control, iterative reconstruction, and/or weight based adjustment of the mA/kV was utilized to reduce the radiation dose to as low as reasonably achievable. COMPARISON: None available. CLINICAL HISTORY: Neck trauma (Age >= 65y) FINDINGS: BONES AND ALIGNMENT: 7 cervical segments are well visualized. Vertebral body height is well maintained. No acute fracture or acute facet abnormality is  noted. DEGENERATIVE CHANGES: Osteophytic changes and facet hypertrophic changes are seen. SOFT TISSUES: Surrounding soft tissue structures are within normal limits. LUNG APICES: Apices are within normal limits. IMPRESSION: 1. Multilevel degenerative change without acute abnormality. Electronically signed by: Oneil Devonshire MD 11/03/2024 08:58 PM EST RP Workstation: [id number]    Microbiology: Results for orders placed or performed during the hospital encounter of 08/28/24  Culture, blood (Routine x 2)     Status: None   Collection Time: 08/28/24  4:15 PM   Specimen: BLOOD LEFT FOREARM  Result Value Ref Range Status   Specimen Description   Final    BLOOD LEFT FOREARM Performed at [facility], [address], [place], [place] [address]    Special Requests   Final    BOTTLES DRAWN AEROBIC AND ANAEROBIC Blood Culture adequate volume Performed at [facility], [address] [place], [place] [address]    Culture   Final    NO GROWTH 5 DAYS Performed at [facility], [address], [place], [place] [address]    Report Status 09/02/2024 FINAL  Final  Culture, blood (Routine x 2)     Status: None   Collection Time: 08/28/24  4:16 PM   Specimen: BLOOD RIGHT FOREARM  Result Value Ref Range Status   Specimen Description   Final    BLOOD RIGHT FOREARM Performed at [facility], [address], [place], [place] [address]    Special Requests   Final    BOTTLES DRAWN AEROBIC AND ANAEROBIC Blood Culture adequate volume Performed at [facility], [address] [place], [place] [address]    Culture   Final    NO GROWTH 5 DAYS Performed at [facility], [address], [place], [place] [address]    Report Status 09/02/2024 FINAL  Final  Aerobic/Anaerobic Culture w Gram Stain (surgical/deep wound)     Status: None   Collection Time: 08/28/24 10:16 PM   Specimen: Path Tissue  Result Value Ref Range Status   Specimen Description  TISSUE  Final   Special Requests PERINEUM TISSUE CULTURE SPEC A  Final   Gram Stain   Final    FEW WBC PRESENT, PREDOMINANTLY PMN MODERATE GRAM POSITIVE COCCI RARE GRAM NEGATIVE RODS    Culture   Final    ABUNDANT STREPTOCOCCUS CONSTELLATUS RARE STAPHYLOCOCCUS AUREUS FEW PREVOTELLA BIVIA BETA LACTAMASE POSITIVE Performed at [facility], [address], [place], [place] [address]    Report Status 09/02/2024 FINAL  Final   Organism ID, Bacteria STAPHYLOCOCCUS AUREUS  Final   Organism ID, Bacteria STREPTOCOCCUS CONSTELLATUS  Final      Susceptibility   Streptococcus constellatus - MIC*    PENICILLIN <=0.06 SENSITIVE Sensitive     CEFTRIAXONE  0.25 SENSITIVE Sensitive     LEVOFLOXACIN 0.5 SENSITIVE Sensitive     VANCOMYCIN  0.5 SENSITIVE Sensitive     * ABUNDANT STREPTOCOCCUS CONSTELLATUS   Staphylococcus aureus - MIC*    CIPROFLOXACIN <=0.5 SENSITIVE Sensitive     ERYTHROMYCIN <=  0.25 SENSITIVE Sensitive     GENTAMICIN <=0.5 SENSITIVE Sensitive     OXACILLIN 0.5 SENSITIVE Sensitive     TETRACYCLINE <=1 SENSITIVE Sensitive     VANCOMYCIN  1 SENSITIVE Sensitive     TRIMETH/SULFA <=10 SENSITIVE Sensitive     CLINDAMYCIN <=0.25 SENSITIVE Sensitive     RIFAMPIN <=0.5 SENSITIVE Sensitive     Inducible Clindamycin NEGATIVE Sensitive     LINEZOLID  2 SENSITIVE Sensitive     * RARE STAPHYLOCOCCUS AUREUS  MRSA Next Gen by PCR, Nasal     Status: None   Collection Time: 08/28/24 11:42 PM   Specimen: Nasal Mucosa; Nasal Swab  Result Value Ref Range Status   MRSA by PCR Next Gen NOT DETECTED NOT DETECTED Final    Comment: (NOTE) The GeneXpert MRSA Assay (FDA approved for NASAL specimens only), is one component of a comprehensive MRSA colonization surveillance program. It is not intended to diagnose MRSA infection nor to guide or monitor treatment for MRSA infections. Test performance is not FDA approved in patients less than 50 years old. Performed at [facility], [address], [place], [place] [address]   Culture, blood (Routine X 2) w Reflex to ID Panel     Status: None   Collection Time: 08/29/24  3:29 AM   Specimen: BLOOD  Result Value Ref Range Status   Specimen Description BLOOD SITE NOT SPECIFIED  Final   Special Requests   Final    BOTTLES DRAWN AEROBIC AND ANAEROBIC Blood Culture results may not be optimal due to an inadequate volume of blood received in culture bottles   Culture   Final    NO GROWTH 5 DAYS Performed at [facility], [address], [place], [place] [address]    Report Status 09/03/2024 FINAL  Final  Culture, blood (Routine X 2) w Reflex to ID Panel     Status: None   Collection Time: 08/29/24  3:31 AM   Specimen: BLOOD  Result Value Ref Range Status   Specimen Description BLOOD SITE NOT SPECIFIED  Final   Special Requests   Final    BOTTLES DRAWN AEROBIC AND ANAEROBIC Blood Culture results may not be optimal due to an inadequate volume of blood received in culture bottles   Culture   Final    NO GROWTH 5 DAYS Performed at [facility], [address], [place], [place] [address]    Report Status 09/03/2024 FINAL  Final    Labs: CBC: Recent Labs  Lab 11/03/24 2002 11/03/24 2017 11/04/24 0223  WBC 17.8*  --  13.9*  HGB 10.8* 11.9* 10.6*  HCT 32.9* 35.0* 32.8*  MCV 90.9  --  90.1  PLT 219  --  232   Basic Metabolic Panel: Recent Labs  Lab 11/03/24 2002 11/03/24 2017 11/04/24 0223 11/05/24 0847 11/06/24 0638  NA 139 139 139 144 140  K 3.7 3.6 3.4* 3.7 4.0  CL 101 102 102 105 102  CO2 25  --  25 26 25   GLUCOSE 295* 303* 284* 186* 375*  BUN 13 14 15 20  29*  CREATININE 2.35* 2.40* 2.51* 3.16* 3.72*  CALCIUM 8.6*  --  8.5* 8.9 8.7*   Liver Function Tests: Recent Labs  Lab 11/03/24 2002  AST 30  ALT 16  ALKPHOS 108  BILITOT 0.7  PROT 7.2  ALBUMIN  3.5   CBG: Recent Labs  Lab 11/05/24 1550 11/05/24 1928 11/05/24 2309 11/06/24 0301 11/06/24 0830  GLUCAP 259* 327* 285*  369* 411*     Discharge time spent: 38 minutes   Signed: Casimer Dare, MD [facility] 11/06/2024 "

## 2024-11-06 NOTE — Progress Notes (Signed)
 Pt is at HD. Has 1hr39min left in dialysis and will be dischrged from room by RN after HD complete

## 2024-11-07 DIAGNOSIS — S069XAA Unspecified intracranial injury with loss of consciousness status unknown, initial encounter: Secondary | ICD-10-CM | POA: Diagnosis not present

## 2024-11-07 LAB — HEPATITIS B SURFACE ANTIBODY, QUANTITATIVE: Hep B S AB Quant (Post): 957 m[IU]/mL

## 2024-11-07 NOTE — TOC Transition Note (Signed)
 Transition of Care - Initial Contact after Hospitalization  Date of discharge: 11/06/2024  Date of contact: 11/07/2024  Method: Attempted Phone Call Spoke to: No Answer  Patient contacted to discuss transition of care from recent inpatient hospitalization but patient didn't pick up the phone. Left a voicemail advising to reach back out to the [facility] HD unit [phone] for any questions or concerns.  Patient will return to his outpatient HD unit on: On Sunday or Monday next week depending on the [facility]'s holiday schedule.  Charmaine Piety, NP

## 2024-11-11 ENCOUNTER — Emergency Department (HOSPITAL_COMMUNITY)

## 2024-11-11 ENCOUNTER — Other Ambulatory Visit: Payer: Self-pay

## 2024-11-11 ENCOUNTER — Inpatient Hospital Stay (HOSPITAL_COMMUNITY)
Admission: EM | Admit: 2024-11-11 | Discharge: 2024-11-23 | DRG: 314 | Disposition: A | Discharge status: Rehabilitation Facility | Attending: Internal Medicine | Admitting: Internal Medicine

## 2024-11-11 DIAGNOSIS — Z87820 Personal history of traumatic brain injury: Secondary | ICD-10-CM

## 2024-11-11 DIAGNOSIS — I38 Endocarditis, valve unspecified: Secondary | ICD-10-CM | POA: Diagnosis not present

## 2024-11-11 DIAGNOSIS — E119 Type 2 diabetes mellitus without complications: Secondary | ICD-10-CM | POA: Diagnosis not present

## 2024-11-11 DIAGNOSIS — D329 Benign neoplasm of meninges, unspecified: Secondary | ICD-10-CM | POA: Diagnosis present

## 2024-11-11 DIAGNOSIS — I132 Hypertensive heart and chronic kidney disease with heart failure and with stage 5 chronic kidney disease, or end stage renal disease: Secondary | ICD-10-CM | POA: Diagnosis present

## 2024-11-11 DIAGNOSIS — D6959 Other secondary thrombocytopenia: Secondary | ICD-10-CM | POA: Diagnosis not present

## 2024-11-11 DIAGNOSIS — E7800 Pure hypercholesterolemia, unspecified: Secondary | ICD-10-CM | POA: Diagnosis present

## 2024-11-11 DIAGNOSIS — S065XAA Traumatic subdural hemorrhage with loss of consciousness status unknown, initial encounter: Secondary | ICD-10-CM | POA: Diagnosis present

## 2024-11-11 DIAGNOSIS — Z9181 History of falling: Secondary | ICD-10-CM

## 2024-11-11 DIAGNOSIS — B964 Proteus (mirabilis) (morganii) as the cause of diseases classified elsewhere: Secondary | ICD-10-CM | POA: Diagnosis present

## 2024-11-11 DIAGNOSIS — R509 Fever, unspecified: Principal | ICD-10-CM

## 2024-11-11 DIAGNOSIS — S065XAD Traumatic subdural hemorrhage with loss of consciousness status unknown, subsequent encounter: Secondary | ICD-10-CM | POA: Diagnosis not present

## 2024-11-11 DIAGNOSIS — L408 Other psoriasis: Secondary | ICD-10-CM | POA: Diagnosis not present

## 2024-11-11 DIAGNOSIS — E66812 Obesity, class 2: Secondary | ICD-10-CM | POA: Diagnosis present

## 2024-11-11 DIAGNOSIS — N2581 Secondary hyperparathyroidism of renal origin: Secondary | ICD-10-CM | POA: Diagnosis present

## 2024-11-11 DIAGNOSIS — Z992 Dependence on renal dialysis: Secondary | ICD-10-CM | POA: Diagnosis not present

## 2024-11-11 DIAGNOSIS — G4733 Obstructive sleep apnea (adult) (pediatric): Secondary | ICD-10-CM | POA: Diagnosis present

## 2024-11-11 DIAGNOSIS — W19XXXA Unspecified fall, initial encounter: Secondary | ICD-10-CM | POA: Diagnosis present

## 2024-11-11 DIAGNOSIS — E1122 Type 2 diabetes mellitus with diabetic chronic kidney disease: Secondary | ICD-10-CM | POA: Diagnosis present

## 2024-11-11 DIAGNOSIS — T148XXA Other injury of unspecified body region, initial encounter: Secondary | ICD-10-CM | POA: Diagnosis not present

## 2024-11-11 DIAGNOSIS — S069XAD Unspecified intracranial injury with loss of consciousness status unknown, subsequent encounter: Secondary | ICD-10-CM | POA: Diagnosis not present

## 2024-11-11 DIAGNOSIS — R682 Dry mouth, unspecified: Secondary | ICD-10-CM | POA: Diagnosis present

## 2024-11-11 DIAGNOSIS — L409 Psoriasis, unspecified: Secondary | ICD-10-CM | POA: Diagnosis present

## 2024-11-11 DIAGNOSIS — Z9079 Acquired absence of other genital organ(s): Secondary | ICD-10-CM

## 2024-11-11 DIAGNOSIS — L400 Psoriasis vulgaris: Secondary | ICD-10-CM | POA: Diagnosis present

## 2024-11-11 DIAGNOSIS — Z7985 Long-term (current) use of injectable non-insulin antidiabetic drugs: Secondary | ICD-10-CM | POA: Diagnosis not present

## 2024-11-11 DIAGNOSIS — B9561 Methicillin susceptible Staphylococcus aureus infection as the cause of diseases classified elsewhere: Secondary | ICD-10-CM | POA: Diagnosis not present

## 2024-11-11 DIAGNOSIS — R0902 Hypoxemia: Secondary | ICD-10-CM | POA: Diagnosis present

## 2024-11-11 DIAGNOSIS — R739 Hyperglycemia, unspecified: Secondary | ICD-10-CM | POA: Diagnosis not present

## 2024-11-11 DIAGNOSIS — A4101 Sepsis due to Methicillin susceptible Staphylococcus aureus: Secondary | ICD-10-CM | POA: Diagnosis present

## 2024-11-11 DIAGNOSIS — S069X9D Unspecified intracranial injury with loss of consciousness of unspecified duration, subsequent encounter: Secondary | ICD-10-CM | POA: Diagnosis not present

## 2024-11-11 DIAGNOSIS — D573 Sickle-cell trait: Secondary | ICD-10-CM | POA: Diagnosis present

## 2024-11-11 DIAGNOSIS — Z8546 Personal history of malignant neoplasm of prostate: Secondary | ICD-10-CM

## 2024-11-11 DIAGNOSIS — M25361 Other instability, right knee: Secondary | ICD-10-CM | POA: Diagnosis present

## 2024-11-11 DIAGNOSIS — N186 End stage renal disease: Secondary | ICD-10-CM | POA: Diagnosis present

## 2024-11-11 DIAGNOSIS — I5033 Acute on chronic diastolic (congestive) heart failure: Secondary | ICD-10-CM | POA: Diagnosis not present

## 2024-11-11 DIAGNOSIS — K5900 Constipation, unspecified: Secondary | ICD-10-CM | POA: Diagnosis present

## 2024-11-11 DIAGNOSIS — Z794 Long term (current) use of insulin: Secondary | ICD-10-CM

## 2024-11-11 DIAGNOSIS — Z5989 Other problems related to housing and economic circumstances: Secondary | ICD-10-CM

## 2024-11-11 DIAGNOSIS — I10 Essential (primary) hypertension: Secondary | ICD-10-CM | POA: Diagnosis not present

## 2024-11-11 DIAGNOSIS — R7881 Bacteremia: Secondary | ICD-10-CM | POA: Diagnosis not present

## 2024-11-11 DIAGNOSIS — I959 Hypotension, unspecified: Secondary | ICD-10-CM | POA: Diagnosis not present

## 2024-11-11 DIAGNOSIS — Z8249 Family history of ischemic heart disease and other diseases of the circulatory system: Secondary | ICD-10-CM

## 2024-11-11 DIAGNOSIS — E039 Hypothyroidism, unspecified: Secondary | ICD-10-CM | POA: Diagnosis present

## 2024-11-11 DIAGNOSIS — I5032 Chronic diastolic (congestive) heart failure: Secondary | ICD-10-CM | POA: Diagnosis present

## 2024-11-11 DIAGNOSIS — E1140 Type 2 diabetes mellitus with diabetic neuropathy, unspecified: Secondary | ICD-10-CM | POA: Diagnosis present

## 2024-11-11 DIAGNOSIS — S069XAS Unspecified intracranial injury with loss of consciousness status unknown, sequela: Secondary | ICD-10-CM | POA: Diagnosis not present

## 2024-11-11 DIAGNOSIS — D849 Immunodeficiency, unspecified: Secondary | ICD-10-CM | POA: Diagnosis present

## 2024-11-11 DIAGNOSIS — Z860101 Personal history of adenomatous and serrated colon polyps: Secondary | ICD-10-CM

## 2024-11-11 DIAGNOSIS — Z1612 Extended spectrum beta lactamase (ESBL) resistance: Secondary | ICD-10-CM | POA: Diagnosis not present

## 2024-11-11 DIAGNOSIS — N179 Acute kidney failure, unspecified: Secondary | ICD-10-CM | POA: Diagnosis present

## 2024-11-11 DIAGNOSIS — L84 Corns and callosities: Secondary | ICD-10-CM | POA: Diagnosis present

## 2024-11-11 DIAGNOSIS — Z79899 Other long term (current) drug therapy: Secondary | ICD-10-CM

## 2024-11-11 DIAGNOSIS — E1165 Type 2 diabetes mellitus with hyperglycemia: Secondary | ICD-10-CM | POA: Diagnosis present

## 2024-11-11 DIAGNOSIS — Z823 Family history of stroke: Secondary | ICD-10-CM

## 2024-11-11 DIAGNOSIS — Y848 Other medical procedures as the cause of abnormal reaction of the patient, or of later complication, without mention of misadventure at the time of the procedure: Secondary | ICD-10-CM | POA: Diagnosis present

## 2024-11-11 DIAGNOSIS — Z888 Allergy status to other drugs, medicaments and biological substances status: Secondary | ICD-10-CM

## 2024-11-11 DIAGNOSIS — D631 Anemia in chronic kidney disease: Secondary | ICD-10-CM | POA: Diagnosis present

## 2024-11-11 DIAGNOSIS — E871 Hypo-osmolality and hyponatremia: Secondary | ICD-10-CM | POA: Diagnosis not present

## 2024-11-11 DIAGNOSIS — D638 Anemia in other chronic diseases classified elsewhere: Secondary | ICD-10-CM | POA: Diagnosis not present

## 2024-11-11 DIAGNOSIS — E7849 Other hyperlipidemia: Secondary | ICD-10-CM | POA: Diagnosis present

## 2024-11-11 DIAGNOSIS — A419 Sepsis, unspecified organism: Secondary | ICD-10-CM | POA: Diagnosis not present

## 2024-11-11 DIAGNOSIS — E785 Hyperlipidemia, unspecified: Secondary | ICD-10-CM

## 2024-11-11 DIAGNOSIS — J90 Pleural effusion, not elsewhere classified: Secondary | ICD-10-CM | POA: Diagnosis not present

## 2024-11-11 DIAGNOSIS — I5030 Unspecified diastolic (congestive) heart failure: Secondary | ICD-10-CM | POA: Diagnosis present

## 2024-11-11 DIAGNOSIS — E8889 Other specified metabolic disorders: Secondary | ICD-10-CM | POA: Diagnosis present

## 2024-11-11 DIAGNOSIS — Z79890 Hormone replacement therapy (postmenopausal): Secondary | ICD-10-CM

## 2024-11-11 DIAGNOSIS — E1169 Type 2 diabetes mellitus with other specified complication: Secondary | ICD-10-CM | POA: Diagnosis not present

## 2024-11-11 DIAGNOSIS — Z1152 Encounter for screening for COVID-19: Secondary | ICD-10-CM

## 2024-11-11 DIAGNOSIS — Z833 Family history of diabetes mellitus: Secondary | ICD-10-CM

## 2024-11-11 DIAGNOSIS — R5381 Other malaise: Secondary | ICD-10-CM | POA: Diagnosis not present

## 2024-11-11 DIAGNOSIS — Z6836 Body mass index (BMI) 36.0-36.9, adult: Secondary | ICD-10-CM

## 2024-11-11 DIAGNOSIS — T80211A Bloodstream infection due to central venous catheter, initial encounter: Secondary | ICD-10-CM | POA: Diagnosis present

## 2024-11-11 DIAGNOSIS — H8110 Benign paroxysmal vertigo, unspecified ear: Secondary | ICD-10-CM | POA: Diagnosis not present

## 2024-11-11 DIAGNOSIS — Z8739 Personal history of other diseases of the musculoskeletal system and connective tissue: Secondary | ICD-10-CM

## 2024-11-11 DIAGNOSIS — Z825 Family history of asthma and other chronic lower respiratory diseases: Secondary | ICD-10-CM

## 2024-11-11 LAB — CBC
HCT: 28.3 % — ABNORMAL LOW (ref 39.0–52.0)
Hemoglobin: 9.7 g/dL — ABNORMAL LOW (ref 13.0–17.0)
MCH: 28.9 pg (ref 26.0–34.0)
MCHC: 34.3 g/dL (ref 30.0–36.0)
MCV: 84.2 fL (ref 80.0–100.0)
Platelets: 162 K/uL (ref 150–400)
RBC: 3.36 MIL/uL — ABNORMAL LOW (ref 4.22–5.81)
RDW: 16.6 % — ABNORMAL HIGH (ref 11.5–15.5)
WBC: 21.6 K/uL — ABNORMAL HIGH (ref 4.0–10.5)
nRBC: 0 % (ref 0.0–0.2)

## 2024-11-11 LAB — I-STAT CG4 LACTIC ACID, ED
Lactic Acid, Venous: 1.7 mmol/L (ref 0.5–1.9)
Lactic Acid, Venous: 1.4 mmol/L (ref 0.5–1.9)

## 2024-11-11 LAB — URINALYSIS, ROUTINE W REFLEX MICROSCOPIC
Bilirubin Urine: NEGATIVE
Glucose, UA: >=500 mg/dL — AB
Ketones, ur: NEGATIVE mg/dL
Nitrite: NEGATIVE
Protein, ur: >=300 mg/dL — AB
Specific Gravity, Urine: 1.012 (ref 1.005–1.030)
pH: 7 (ref 5.0–8.0)

## 2024-11-11 LAB — COMPREHENSIVE METABOLIC PANEL WITH GFR
ALT: 10 U/L (ref 0–44)
AST: 17 U/L (ref 15–41)
Albumin: 2.5 g/dL — ABNORMAL LOW (ref 3.5–5.0)
Alkaline Phosphatase: 116 U/L (ref 38–126)
Anion gap: 12 (ref 5–15)
BUN: 29 mg/dL — ABNORMAL HIGH (ref 8–23)
CO2: 24 mmol/L (ref 22–32)
Calcium: 8.1 mg/dL — ABNORMAL LOW (ref 8.9–10.3)
Chloride: 92 mmol/L — ABNORMAL LOW (ref 98–111)
Creatinine, Ser: 3.82 mg/dL — ABNORMAL HIGH (ref 0.61–1.24)
GFR, Estimated: 16 mL/min — ABNORMAL LOW
Glucose, Bld: 380 mg/dL — ABNORMAL HIGH (ref 70–99)
Potassium: 4.3 mmol/L (ref 3.5–5.1)
Sodium: 128 mmol/L — ABNORMAL LOW (ref 135–145)
Total Bilirubin: 1.2 mg/dL (ref 0.0–1.2)
Total Protein: 6.1 g/dL — ABNORMAL LOW (ref 6.5–8.1)

## 2024-11-11 LAB — RESP PANEL BY RT-PCR (RSV, FLU A&B, COVID)  RVPGX2
Influenza A by PCR: NEGATIVE
Influenza B by PCR: NEGATIVE
Resp Syncytial Virus by PCR: NEGATIVE
SARS Coronavirus 2 by RT PCR: NEGATIVE

## 2024-11-11 LAB — CBG MONITORING, ED: Glucose-Capillary: 515 mg/dL (ref 70–99)

## 2024-11-11 LAB — GLUCOSE, CAPILLARY: Glucose-Capillary: 494 mg/dL — ABNORMAL HIGH (ref 70–99)

## 2024-11-11 LAB — C-REACTIVE PROTEIN: CRP: 31.5 mg/dL — ABNORMAL HIGH

## 2024-11-11 MED ORDER — INSULIN ASPART 100 UNIT/ML IJ SOLN
4.0000 [IU] | Freq: Once | INTRAMUSCULAR | Status: AC
  Administered 2024-11-11: 4 [IU] via INTRAVENOUS

## 2024-11-11 MED ORDER — HEPARIN SODIUM (PORCINE) 5000 UNIT/ML IJ SOLN: 5000.0000 [IU] | Freq: Three times a day (TID) | INTRAMUSCULAR | Status: DC

## 2024-11-11 MED ORDER — INSULIN ASPART 100 UNIT/ML IJ SOLN: 0.0000 [IU] | Freq: Three times a day (TID) | INTRAMUSCULAR | Status: DC

## 2024-11-11 MED ORDER — INSULIN ASPART 100 UNIT/ML IJ SOLN
10.0000 [IU] | Freq: Once | INTRAMUSCULAR | Status: AC
  Administered 2024-11-11: 10 [IU] via SUBCUTANEOUS
  Filled 2024-11-11: qty 10

## 2024-11-11 MED ORDER — VANCOMYCIN HCL 2000 MG/400ML IV SOLN
2000.0000 mg | Freq: Once | INTRAVENOUS | Status: AC
  Administered 2024-11-11: 2000 mg via INTRAVENOUS
  Filled 2024-11-11: qty 400

## 2024-11-11 MED ORDER — ACETAMINOPHEN 500 MG PO TABS
1000.0000 mg | ORAL_TABLET | ORAL | Status: AC
  Administered 2024-11-11: 1000 mg via ORAL
  Filled 2024-11-11: qty 2

## 2024-11-11 MED ORDER — ACETAMINOPHEN 325 MG PO TABS
650.0000 mg | ORAL_TABLET | Freq: Four times a day (QID) | ORAL | Status: AC | PRN
  Administered 2024-11-12 – 2024-11-14 (×2): 650 mg via ORAL
  Filled 2024-11-11 (×2): qty 2

## 2024-11-11 MED ORDER — ACETAMINOPHEN 650 MG RE SUPP: 650.0000 mg | Freq: Four times a day (QID) | RECTAL | Status: DC | PRN

## 2024-11-11 MED ORDER — ONDANSETRON HCL 4 MG PO TABS: 4.0000 mg | ORAL_TABLET | Freq: Four times a day (QID) | ORAL | Status: AC | PRN

## 2024-11-11 MED ORDER — SODIUM CHLORIDE 0.9 % IV SOLN
1.0000 g | INTRAVENOUS | Status: DC
  Administered 2024-11-12 – 2024-11-18 (×7): 1 g via INTRAVENOUS
  Filled 2024-11-11 (×6): qty 10

## 2024-11-11 MED ORDER — INSULIN ASPART 100 UNIT/ML IJ SOLN
0.0000 [IU] | Freq: Every day | INTRAMUSCULAR | Status: DC
  Administered 2024-11-11: 5 [IU] via SUBCUTANEOUS
  Filled 2024-11-11: qty 5

## 2024-11-11 MED ORDER — INSULIN ASPART 100 UNIT/ML IJ SOLN: 8.0000 [IU] | Freq: Once | INTRAMUSCULAR | Status: DC

## 2024-11-11 MED ORDER — INSULIN GLARGINE-YFGN 100 UNIT/ML ~~LOC~~ SOLN: 20.0000 [IU] | Freq: Every day | SUBCUTANEOUS | Status: DC

## 2024-11-11 MED ORDER — METRONIDAZOLE 500 MG/100ML IV SOLN
500.0000 mg | Freq: Two times a day (BID) | INTRAVENOUS | Status: DC
  Administered 2024-11-11 – 2024-11-13 (×4): 500 mg via INTRAVENOUS
  Filled 2024-11-11 (×4): qty 100

## 2024-11-11 MED ORDER — ONDANSETRON HCL 4 MG/2ML IJ SOLN: 4.0000 mg | Freq: Four times a day (QID) | INTRAMUSCULAR | Status: DC | PRN

## 2024-11-11 MED ORDER — SODIUM CHLORIDE 0.9 % IV SOLN: 2.0000 g | INTRAVENOUS | Status: DC

## 2024-11-11 MED ORDER — SODIUM CHLORIDE 0.9 % IV SOLN
2.0000 g | Freq: Once | INTRAVENOUS | Status: AC
  Administered 2024-11-11: 2 g via INTRAVENOUS
  Filled 2024-11-11: qty 12.5

## 2024-11-11 MED ORDER — SODIUM CHLORIDE 0.9 % IV BOLUS
500.0000 mL | Freq: Once | INTRAVENOUS | Status: AC
  Administered 2024-11-11: 500 mL via INTRAVENOUS

## 2024-11-11 MED ORDER — SIMVASTATIN 20 MG PO TABS
20.0000 mg | ORAL_TABLET | Freq: Every evening | ORAL | Status: DC
  Administered 2024-11-11 – 2024-11-22 (×12): 20 mg via ORAL
  Filled 2024-11-11 (×5): qty 1

## 2024-11-11 MED ORDER — ALBUTEROL SULFATE (2.5 MG/3ML) 0.083% IN NEBU: 2.5000 mg | INHALATION_SOLUTION | Freq: Four times a day (QID) | RESPIRATORY_TRACT | Status: AC | PRN

## 2024-11-11 MED ORDER — INSULIN ASPART 100 UNIT/ML IJ SOLN
0.0000 [IU] | Freq: Three times a day (TID) | INTRAMUSCULAR | Status: DC
  Filled 2024-11-11: qty 1

## 2024-11-11 MED ORDER — SODIUM CHLORIDE 0.9% FLUSH
3.0000 mL | Freq: Two times a day (BID) | INTRAVENOUS | Status: AC
  Administered 2024-11-11 – 2024-11-23 (×23): 3 mL via INTRAVENOUS

## 2024-11-11 MED ORDER — METRONIDAZOLE 500 MG/100ML IV SOLN
500.0000 mg | Freq: Once | INTRAVENOUS | Status: AC
  Administered 2024-11-11: 500 mg via INTRAVENOUS
  Filled 2024-11-11: qty 100

## 2024-11-11 MED ORDER — VANCOMYCIN HCL IN DEXTROSE 1-5 GM/200ML-% IV SOLN: 1000.0000 mg | Freq: Once | INTRAVENOUS | Status: DC

## 2024-11-11 MED ORDER — INSULIN GLARGINE 100 UNIT/ML ~~LOC~~ SOLN
20.0000 [IU] | Freq: Every day | SUBCUTANEOUS | Status: DC
  Administered 2024-11-11: 20 [IU] via SUBCUTANEOUS
  Filled 2024-11-11 (×3): qty 0.2

## 2024-11-11 MED ORDER — LEVOTHYROXINE SODIUM 25 MCG PO TABS
125.0000 ug | ORAL_TABLET | Freq: Every morning | ORAL | Status: DC
  Administered 2024-11-12 – 2024-11-23 (×10): 125 ug via ORAL
  Filled 2024-11-11 (×5): qty 1

## 2024-11-11 NOTE — ED Notes (Signed)
 Wound on testicles was packed with 2x2 gauze soaked with NS/ placed by wife

## 2024-11-11 NOTE — Consult Note (Signed)
 WOC Nurse Consult Note: patient known to [facility] team from previous admission status post I&D necrotizing soft tissue infection of the perineum and scrotum on 08/28/2024 by Dr. Lyndel  Reason for Consult: testicular wound  Wound type: full thickness surgical as above  Pressure Injury POA: NA  Measurement: see nursing flowsheet  Wound bed: appears red moist  Drainage (amount, consistency, odor) see nursing flowsheet  Periwound: Dressing procedure/placement/frequency: Cleanse scrotal/perineal wound with Vashe, do not rinse. Using a Q tip applicator apply Vashe moistened Kerlix into wound bed 2 times daily.  Cover with ABD pad and attempt to secure with Mesh underwear.    POC discussed with bedside nurse. WOC team will not follow. Reconsult if further needs arise.   Thank you,    Powell Bar MSN, RN-BC, [employer]

## 2024-11-11 NOTE — Progress Notes (Signed)
 Pharmacy Antibiotic Note  Brendan [name]. is a 68 y.o. male admitted on 11/11/2024 with sepsis.  Pharmacy has been consulted for Vancomycin  dosing.  Plan: Vancomycin  2000 mg IV x 1 dose. Since patient is on hemodialysis, future doses will be based on HD schedule.     Temp (24hrs), Avg:99.5 F (37.5 C), Min:97.8 F (36.6 C), Max:101.1 F (38.4 C)  Recent Labs  Lab 11/05/24 0847 11/06/24 0638 11/11/24 1120 11/11/24 1333 11/11/24 1508  WBC  --   --  21.6*  --   --   CREATININE 3.16* 3.72* 3.82*  --   --   LATICACIDVEN  --   --   --  1.7 1.4    Estimated Creatinine Clearance: 25.2 mL/min (A) (by C-G formula based on SCr of 3.82 mg/dL (H)).    Allergies[1]  Antimicrobials this admission: Vancomycin  12/24 >>  Cefepime  12/24 >>  Metronidazole  12/24 >>  Thank you for allowing pharmacy to be a part of this patients care.  Brendan [name] 11/11/2024 5:46 PM     [1]  Allergies Allergen Reactions   Ace Inhibitors Cough   Maxidex  [Dexamethasone ] Other (See Comments)    Sexual dysfunction   Verapamil Other (See Comments)    constipation

## 2024-11-11 NOTE — Progress Notes (Signed)
 Pt receives out-pt HD at [facility] [place], 0830am chair time. Will assist as needed. Attempted calling clinic, TCU is not available for calls at this time, as they are not open on wednesdays.   Lavanda Fread [facility] Dialysis Navigator [phone]

## 2024-11-11 NOTE — ED Triage Notes (Signed)
 Pt BIB [facility] from home for weakness. Pt gets dialyzed M/Tue/Th/Fri, last dialyzed yesterday. Per EMS O2 88% on RA, placed on 3LNC with O2 94-96%. HR 90, BP 100/84. Aox4.

## 2024-11-11 NOTE — H&P (Addendum)
 " History and Physical    Patient: Brendan [name]. FMW:[id number] DOB: [birth year] DOA: 11/11/2024 DOS: the patient was seen and examined on 11/11/2024 PCP: Rexanne Ingle, MD  Patient coming from: Home  Chief Complaint: Maxcine  HPI: Brendan [name]. is a 68 y.o. male with medical history significant of ESRD on HD, hypertension, hyperlipidemia, diabetes mellitus type 2, hypothyroidism, and obesity presents with low blood pressure and lack of energy.  Patient had just recently been hospitalized from 12/16-12/19 after having a fall noted to have concern for traumatic brain injury.  CT of the brain revealed a  right frontal extra-axial mass most consistent with a meningioma.  Follow-up MRI of the brain showing small hemorrhagic contusions, trace regional subarachnoid hemorrhage, and trace intraventricular hemorrhage, large posterior scalp contusion, and the meningioma.  Neurosurgery had been consulted and gave recommendations patient was temporarily on antiseizure medication prior to discharge.  He experienced low blood pressure and lack of energy upon waking up today, despite feeling normal the previous night. He is on a dialysis regimen of four days a week but did not attend his scheduled session today due to these symptoms. No fever or chills were reported, although he was told he had a fever. He also reports a little headache and nausea, but no recent contact with sick individuals, cough, or diarrhea. He mentions having a dry mouth.  He is on insulin  therapy for type 2 diabetes mellitus, taking approximately 20 units of Lantus  once daily and 15 units of fast-acting Humalog  as needed. He notes that his blood sugar is currently a little high.  He reports having a tunnel catheter on the right side for dialysis and a history of psoriasis. He reports still producing some urine and denies having any open wounds.  In the emergency department patient was noted to be febrile up to 101.1 F, blood pressures  91/44 to 111/55, and O2 saturation maintained on room air.  Labs significant for WBC 21.6, hemoglobin 9.7, sodium 128, potassium 4.3, BUN 29, creatinine 3.82, glucose 380, lactic acid 1.4.  Chest x-ray noted stable cardiomegaly without acute abnormality.  Influenza, COVID-19, and RSV screening were negative.  Nephrology was consulted.  Blood cultures were obtained.  Patient was given 5 500 mL with bolus of IV fluids, vancomycin , metronidazole , and cefepime .   Review of Systems: As mentioned in the history of present illness. All other systems reviewed and are negative. Past Medical History:  Diagnosis Date   Acanthosis nigricans    Atopic dermatitis    CHF (congestive heart failure) (HCC)    CKD (chronic kidney disease) stage 4, GFR 15-29 ml/min (HCC) 01/08/2023   Diabetes (HCC) 11/19/2002   Erectile dysfunction    Fatty liver 11/19/2005   Glaucoma 11/19/2006   Gynecomastia 11/20/2007   Hx of adenomatous colonic polyps 08/26/2023   Hyperaldosteronism 11/19/1998   Hypercholesterolemia    Hyperlipidemia 2010   Hypertension 1999   Hypoglycemic reaction    Hypothyroidism 11/19/2002   Incontinence    Intermittent vertigo 11/19/2010   Left cervical radiculopathy 11/19/2010   Lumbar radiculopathy    Obesity    Peripheral neuropathy    Pollen allergies 11/19/2005   perennial   Prostate cancer (HCC) 11/19/2004   Reflux esophagitis 11/19/1993   Sickle cell trait 11/19/2004   Sleep apnea, obstructive 11/19/1998   uses a cpap   Venous insufficiency 11/20/2003   Vitamin D deficiency 11/19/2010   Past Surgical History:  Procedure Laterality Date   COLONOSCOPY  2006   normal  INCISION AND DRAINAGE OF WOUND N/A 08/28/2024   Procedure: IRRIGATION AND EXCISIONAL DEBRIDEMENT WOUND;  Surgeon: Lyndel Deward PARAS, MD;  Location: [facility] OR;  Service: General;  Laterality: N/A;  EXCISIONAL DEBRIDEMENT   INCISION AND DRAINAGE PERIRECTAL ABSCESS N/A 08/30/2024   Procedure: INCISION AND DRAINAGE OF  PERINEAL WOUND;  Surgeon: Polly Cordella LABOR, MD;  Location: [facility] OR;  Service: General;  Laterality: N/A;   IR TUNNELED CENTRAL VENOUS CATH PLC W IMG  09/18/2024   IR TUNNELED CENTRAL VENOUS CATH PLC W IMG  09/22/2024   lap band surgery  2009   left inguinal hernia repair  1998   LIPOMA EXCISION Left 04/17/2023   Procedure: MINOR EXCISION LEFT BUTTOCK SEBACEOUS CYST;  Surgeon: Lyndel Deward PARAS, MD;  Location: [facility];  Service: General;  Laterality: Left;   robotic prostatectomy  2008   Social History:  reports that he has never smoked. He has never used smokeless tobacco. He reports current alcohol use. He reports that he does not use drugs.  Allergies[1]  Family History  Problem Relation Age of Onset   Heart failure Mother    Hypertension Father        deceased age 58   Heart attack Father    COPD Sister    CVA Sister    Diabetes Daughter    Colon cancer Neg Hx    Stomach cancer Neg Hx    Colon polyps Neg Hx    Esophageal cancer Neg Hx    Rectal cancer Neg Hx     Prior to Admission medications  Medication Sig Start Date End Date Taking? Authorizing Provider  acetaminophen  (TYLENOL ) 500 MG tablet Take 500 mg by mouth every 6 (six) hours as needed for mild pain.    [provider]  amLODipine  (NORVASC ) 5 MG tablet Take 1 tablet (5 mg total) by mouth daily. 10/21/24     bimekizumab-bkzx (BIMZELX) 160 MG/ML prefilled syringe Inject 1 Syringe into the skin every 30 (thirty) days. 08/24/24   [provider]  carvedilol  (COREG ) 12.5 MG tablet Take 1 tablet (12.5 mg total) by mouth 2 (two) times daily with a meal. Follow your blood pressure outpatient and get refills and adjustments with your PCP outpatient 09/22/24 11/04/24  Perri LABOR Meliton Mickey., MD  clobetasol  ointment (TEMOVATE ) 0.05 % Apply topically daily before sleeping. Patient not taking: Reported on 08/29/2024 05/19/24     Continuous Glucose Sensor (FREESTYLE LIBRE 3 PLUS SENSOR) MISC Apply to  upper back of arm. Change every 15 days. 04/03/24     Continuous Glucose Sensor (FREESTYLE LIBRE 3 SENSOR) MISC Apply to upper back of arm and change ever 14 days. 01/28/24   Faythe Purchase, MD  dorzolamide  (TRUSOPT ) 2 % ophthalmic solution Place 1 drop into both eyes daily.    [provider]  insulin  glargine (LANTUS  SOLOSTAR) 100 UNIT/ML Solostar Pen Inject 15 Units into the skin daily. Follow up with your PCP outpatient for further adjustment to your regimen Patient taking differently: Inject 20 Units into the skin daily. Follow up with your PCP outpatient for further adjustment to your regimen 09/03/24   Perri LABOR Meliton Mickey., MD  insulin  lispro (HUMALOG  [facility]) 200 UNIT/ML KwikPen Inject 0-15 Units into the skin 3 (three) times daily with meals. CBG < 70: treat low blood sugar CBG 70 - 120: 0 units  CBG 121 - 150: 2 units  CBG 151 - 200: 3 units  CBG 201 - 250: 5 units  CBG 251 -  300: 8 units  CBG 301 - 350: 11 units  CBG 351 - 400: 15 units  CBG >400: Call MD 09/03/24   Perri DELENA Meliton Mickey., MD  Insulin  Pen Needle (TECHLITE PEN NEEDLES) 32G X 4 MM MISC Use to inject insulin  4 times daily 10/25/22   Faythe Purchase, MD  Insulin  Pen Needle 32G X 4 MM MISC Use to inject insulin  up to 4 times daily. 12/26/23   Faythe Purchase, MD  levothyroxine  (SYNTHROID ) 125 MCG tablet Take 1 tablet (125 mcg total) by mouth every morning on an empty stomach 06/08/24     simvastatin  (ZOCOR ) 20 MG tablet Take 1 tablet (20 mg total) by mouth every evening. 07/09/24     timolol  (BETIMOL ) 0.5 % ophthalmic solution Place 1 drop into both eyes daily.    [provider]  tirzepatide  (MOUNJARO ) 15 MG/0.5ML Pen Inject 15 mg into the skin once a week. 04/14/24     torsemide  (DEMADEX ) 100 MG tablet Take 1 tablet (100 mg total) by mouth in the morning except on dialysis days 10/09/24   Marlee Bernardino NOVAK, MD    Physical Exam: Vitals:   11/11/24 1200 11/11/24 1215 11/11/24 1245 11/11/24 1420  BP: (!) 102/53  (!) 97/45  (!) 111/55  Pulse: 78 77 77 75  Resp: 16  18 15   Temp:    97.8 F (36.6 C)  TempSrc:    Oral  SpO2: 94% 92% 90% 95%    Constitutional: Elderly male who appears chronically ill Eyes: PERRL, lids and conjunctivae normal ENMT: Mucous membranes are moist. Posterior pharynx clear of any exudate or lesions.Normal dentition.  Neck: normal, supple  Respiratory: clear to auscultation bilaterally, no wheezing, no crackles. Normal respiratory effort. No accessory muscle use.  Cardiovascular: Regular rate and rhythm, no murmurs / rubs / gallops.  At least +1 pitting lower extremity edema.  Right upper chest wall hemodialysis catheter in place.  Abdomen: no tenderness, no masses palpated. Bowel sounds positive.  Musculoskeletal: no clubbing / cyanosis. No joint deformity upper and lower extremities. Good ROM, no contractures. Normal muscle tone.  Skin: Psoriasis plaques on bilateral hands.  Small open scrotal/perineal wound without signs of surrounding erythema with packing in place as seen below Neurologic: CN 2-12 grossly intact.  Strength 5/5 in all 4.  Psychiatric: Normal judgment and insight. Alert and oriented x 3. Normal mood.   Data Reviewed:  EKG revealed sinus rhythm 87 bpm with premature atrial complexes and left axis deviation.  Reviewed labs, imaging, and pertinent records as documented.  Assessment and Plan:   Sepsis, unclear source Acute.  Patient presents with fever up to 101.1 F with white blood cell count elevated 21.6.  Chest x-ray noted stable cardiomegaly without acute abnormality.Influenza, COVID-19, and RSV screening were negative.  Question possibility of bloodstream infection as patient is on hemodialysis via tunneled dialysis catheter.  Patient was started on empiric antibiotics of vancomycin , metronidazole , and cefepime . - Admit to a telemetry bed - Follow-up blood culture - Check complete respiratory virus panel - Check CRP - Continue vancomycin ,  metronidazole , and cefepime .  De-escalate when deemed medically appropriate   Uncontrolled diabetes mellitus type 2, with long-term use of insulin  On admission glucose noted to be elevated at 380.  Last available hemoglobin A1c noted to be 8.4.  Home medication regimen includes Lantus  20 units daily along with moderate sliding scale insulin  and Mounjaro . - Hypoglycemic protocols  - Continue pharmacy substitution of Semglee  20 units daily - CBGs before every meal  with resistant SSI - Adjust insulin  regimen as deemed medically appropriate  Hypotension Blood pressures noted to be as low as 91/44.  Home medication regimen includes amlodipine , furosemide , and carvedilol .  Patient was bolused 500 mL of normal saline IV fluids with some improvement in blood pressures. - Goal maps greater than 65 - Hold home blood pressure regimen at this time.  Determine when medically appropriate to resume.  History of fall with traumatic brain injury Weakness Patient just recently had a fall where MRI of the brain revealed small hemorrhagic contusions, trace regional subarachnoid hemorrhage, and trace intraventricular hemorrhage, large posterior scalp contusion, and a meningioma.  Wife makes note that since that time he has not been very ambulatory on his feet. - PT to evaluate and treat  ESRD on HD Patient dialyzes 4 days a week.  Nephrology consulted for need of dialysis. - HD per nephrology  Heart failure with preserved ejection fraction Follows with Dr. Cherrie.  Last EF 60 to 65% - Blood pressure regimen currently on hold due to low blood pressures.  Resume when deemed medically appropriate  Scrotal wound Patient with a history of necrotizing fasciitis of the scrotum and perineum back in October.  Still have small sscrotal and perineal wound present without surrounding erythema present that his wife still packs twice daily. - Wound care consulted  Hypothyroidism TSH last noted to be 7.823 when  checked on 09/08/2023 - Continue levothyroxine   Anemia of chronic disease Hemoglobin noted to be 9.7.  Baseline hemoglobin appears to range from 9-11. - Continue to monitor probably not  Hyperlipidemia - Continue simvastatin   OSA on CPAP - Continue CPAP at night   DVT prophylaxis: Patient declined heparin , SCD ordered placed  Advance Care Planning:   Code Status: Full Code    Consults: Nephrology  Family Communication: Wife updated  Severity of Illness: The appropriate patient status for this patient is INPATIENT. Inpatient status is judged to be reasonable and necessary in order to provide the required intensity of service to ensure the patient's safety. The patient's presenting symptoms, physical exam findings, and initial radiographic and laboratory data in the context of their chronic comorbidities is felt to place them at high risk for further clinical deterioration. Furthermore, it is not anticipated that the patient will be medically stable for discharge from the hospital within 2 midnights of admission.   * I certify that at the point of admission it is my clinical judgment that the patient will require inpatient hospital care spanning beyond 2 midnights from the point of admission due to high intensity of service, high risk for further deterioration and high frequency of surveillance required.*  Author: Maximino DELENA Sharps, MD 11/11/2024 3:46 PM  For on call review [email or web address].      [1]  Allergies Allergen Reactions   Ace Inhibitors Cough   Maxidex  [Dexamethasone ] Other (See Comments)    Sexual dysfunction   Verapamil Other (See Comments)    constipation   "

## 2024-11-11 NOTE — ED Provider Notes (Signed)
 " [place] [facility] AT [facility] Provider Note   CSN: [id number] Arrival date & time: 11/11/24  9167     Patient presents with: No chief complaint on file.   Brendan [name]. is a 68 y.o. male.   HPI  Patient is a Tuesday Thursday Saturday dialysis patient last dialysis was a full session yesterday.  Patient presents emergency room today with complaints of generalized fatigue he states both arms and both legs are very weak he was having trouble getting out of bed this morning.  Denies any chest pain or difficulty breathing not coughing no congestion no urinary frequency urgency dysuria hematuria.  Does still make urine.     Prior to Admission medications  Medication Sig Start Date End Date Taking? Authorizing Provider  acetaminophen  (TYLENOL ) 500 MG tablet Take 500 mg by mouth every 6 (six) hours as needed for mild pain.   Yes [provider]  amLODipine  (NORVASC ) 5 MG tablet Take 1 tablet (5 mg total) by mouth daily. 10/21/24  Yes   bimekizumab-bkzx (BIMZELX) 160 MG/ML prefilled syringe Inject 1 Syringe into the skin every 30 (thirty) days. 08/24/24  Yes [provider]  carvedilol  (COREG ) 12.5 MG tablet Take 1 tablet (12.5 mg total) by mouth 2 (two) times daily with a meal. Follow your blood pressure outpatient and get refills and adjustments with your PCP outpatient 09/22/24 11/11/24 Yes Perri DELENA Meliton Raddle., MD  dorzolamide  (TRUSOPT ) 2 % ophthalmic solution Place 1 drop into both eyes daily.   Yes [provider]  insulin  glargine (LANTUS  SOLOSTAR) 100 UNIT/ML Solostar Pen Inject 15 Units into the skin daily. Follow up with your PCP outpatient for further adjustment to your regimen Patient taking differently: Inject 20 Units into the skin daily. Follow up with your PCP outpatient for further adjustment to your regimen 09/03/24  Yes Perri DELENA Meliton Raddle., MD  insulin  lispro (HUMALOG  KWIKPEN) 200 UNIT/ML KwikPen Inject 0-15 Units  into the skin 3 (three) times daily with meals. CBG < 70: treat low blood sugar CBG 70 - 120: 0 units  CBG 121 - 150: 2 units  CBG 151 - 200: 3 units  CBG 201 - 250: 5 units  CBG 251 - 300: 8 units  CBG 301 - 350: 11 units  CBG 351 - 400: 15 units  CBG >400: Call MD 09/03/24  Yes Perri DELENA Meliton Raddle., MD  levothyroxine  (SYNTHROID ) 125 MCG tablet Take 1 tablet (125 mcg total) by mouth every morning on an empty stomach 06/08/24  Yes   simvastatin  (ZOCOR ) 20 MG tablet Take 1 tablet (20 mg total) by mouth every evening. 07/09/24  Yes   tirzepatide  (MOUNJARO ) 15 MG/0.5ML Pen Inject 15 mg into the skin once a week. 04/14/24  Yes   torsemide  (DEMADEX ) 100 MG tablet Take 1 tablet (100 mg total) by mouth in the morning except on dialysis days Patient taking differently: Take 100 mg by mouth See admin instructions. Take one tablet by mouth on Mondays, Tuesdays, Thursdays and Fridays only per patient 10/09/24  Yes Marlee Bernardino NOVAK, MD  clobetasol  ointment (TEMOVATE ) 0.05 % Apply topically daily before sleeping. Patient not taking: Reported on 08/29/2024 05/19/24     Continuous Glucose Sensor (FREESTYLE LIBRE 3 PLUS SENSOR) MISC Apply to upper back of arm. Change every 15 days. 04/03/24     Continuous Glucose Sensor (FREESTYLE LIBRE 3 SENSOR) MISC Apply to upper back of arm and change ever 14 days. 01/28/24   Faythe Purchase, MD  Insulin  Pen Needle (TECHLITE PEN NEEDLES) 32G X 4 MM MISC Use to inject insulin  4 times daily 10/25/22   Faythe Purchase, MD  Insulin  Pen Needle 32G X 4 MM MISC Use to inject insulin  up to 4 times daily. 12/26/23   Faythe Purchase, MD  timolol  (BETIMOL ) 0.5 % ophthalmic solution Place 1 drop into both eyes daily.    [provider]    Allergies: Ace inhibitors, Maxidex  [dexamethasone ], and Verapamil    Review of Systems  Updated Vital Signs BP (!) 111/55 (BP Location: Right Arm)   Pulse 75   Temp 97.8 F (36.6 C) (Oral)   Resp 15   SpO2 95%   Physical Exam Vitals and  nursing note reviewed.  Constitutional:      General: He is not in acute distress.    Comments: Fatigued 68 year old male does not appear to be in any acute distress and is nontoxic-appearing, pleasant, fatigued   HENT:     Head: Normocephalic and atraumatic.     Nose: Nose normal.     Mouth/Throat:     Mouth: Mucous membranes are dry.  Eyes:     General: No scleral icterus. Cardiovascular:     Rate and Rhythm: Normal rate and regular rhythm.     Pulses: Normal pulses.     Heart sounds: Normal heart sounds.  Pulmonary:     Effort: Pulmonary effort is normal. No respiratory distress.     Breath sounds: No wheezing.  Abdominal:     Palpations: Abdomen is soft.     Tenderness: There is no abdominal tenderness. There is no guarding or rebound.  Musculoskeletal:     Cervical back: Normal range of motion.     Right lower leg: No edema.     Left lower leg: No edema.  Skin:    General: Skin is warm and dry.     Capillary Refill: Capillary refill takes less than 2 seconds.     Comments: Port in right chest  Neurological:     Mental Status: He is alert. Mental status is at baseline.  Psychiatric:        Mood and Affect: Mood normal.        Behavior: Behavior normal.     (all labs ordered are listed, but only abnormal results are displayed) Labs Reviewed  COMPREHENSIVE METABOLIC PANEL WITH GFR - Abnormal; Notable for the following components:      Result Value   Sodium 128 (*)    Chloride 92 (*)    Glucose, Bld 380 (*)    BUN 29 (*)    Creatinine, Ser 3.82 (*)    Calcium 8.1 (*)    Total Protein 6.1 (*)    Albumin  2.5 (*)    GFR, Estimated 16 (*)    All other components within normal limits  CBC - Abnormal; Notable for the following components:   WBC 21.6 (*)    RBC 3.36 (*)    Hemoglobin 9.7 (*)    HCT 28.3 (*)    RDW 16.6 (*)    All other components within normal limits  RESP PANEL BY RT-PCR (RSV, FLU A&B, COVID)  RVPGX2  CULTURE, BLOOD (ROUTINE X 2)  CULTURE, BLOOD  (ROUTINE X 2)  RESPIRATORY PANEL BY PCR  MRSA NEXT GEN BY PCR, NASAL  URINALYSIS, ROUTINE W REFLEX MICROSCOPIC  C-REACTIVE PROTEIN  I-STAT CG4 LACTIC ACID, ED  I-STAT CG4 LACTIC ACID, ED    EKG: EKG Interpretation Date/Time:  Wednesday November 11 2024 08:42:16 EST Ventricular Rate:  87 PR Interval:  179 QRS Duration:  82 QT Interval:  366 QTC Calculation: 441 R Axis:   -40  Text Interpretation: Sinus rhythm Atrial premature complexes Left axis deviation Abnormal R-wave progression, early transition No significant change since prior 12/25 Confirmed by Towana Sharper [phone]) on 11/11/2024 8:51:19 AM  Radiology: DG Chest 2 View Result Date: 11/11/2024 CLINICAL DATA:  Chronic fatigue. EXAM: CHEST - 2 VIEW COMPARISON:  November 03, 2024 FINDINGS: There is stable right-sided internal jugular hemodialysis catheter positioning. The cardiac silhouette is mildly enlarged and unchanged in size. Mild atelectatic changes are suspected within the left lung base. No acute infiltrate, pleural effusion or pneumothorax is identified. The visualized skeletal structures are unremarkable. IMPRESSION: Stable cardiomegaly without acute cardiopulmonary disease. Electronically Signed   By: Suzen Dials M.D.   On: 11/11/2024 10:04     .Critical Care  Performed by: Neldon Hamp RAMAN, PA Authorized by: Neldon Hamp RAMAN, PA   Critical care provider statement:    Critical care time (minutes):  35   Critical care time was exclusive of:  Separately billable procedures and treating other patients and teaching time   Critical care was necessary to treat or prevent imminent or life-threatening deterioration of the following conditions:  Sepsis   Critical care was time spent personally by me on the following activities:  Development of treatment plan with patient or surrogate, review of old charts, re-evaluation of patient's condition, pulse oximetry, ordering and review of radiographic studies, ordering and  review of laboratory studies, ordering and performing treatments and interventions, obtaining history from patient or surrogate, examination of patient and evaluation of patient's response to treatment   Care discussed with: admitting provider      Medications Ordered in the ED  vancomycin  (VANCOREADY) IVPB 2000 mg/400 mL (2,000 mg Intravenous New Bag/Given 11/11/24 1554)  heparin  injection 5,000 Units (has no administration in time range)  sodium chloride  flush (NS) 0.9 % injection 3 mL (has no administration in time range)  acetaminophen  (TYLENOL ) tablet 650 mg (has no administration in time range)    Or  acetaminophen  (TYLENOL ) suppository 650 mg (has no administration in time range)  albuterol  (PROVENTIL ) (2.5 MG/3ML) 0.083% nebulizer solution 2.5 mg (has no administration in time range)  ondansetron  (ZOFRAN ) tablet 4 mg (has no administration in time range)    Or  ondansetron  (ZOFRAN ) injection 4 mg (has no administration in time range)  insulin  aspart (novoLOG ) injection 0-15 Units (has no administration in time range)  insulin  aspart (novoLOG ) injection 0-5 Units (has no administration in time range)  insulin  glargine (LANTUS ) injection 20 Units (has no administration in time range)  acetaminophen  (TYLENOL ) tablet 1,000 mg (1,000 mg Oral Given 11/11/24 1123)  sodium chloride  0.9 % bolus 500 mL (0 mLs Intravenous Stopped 11/11/24 1505)  ceFEPIme  (MAXIPIME ) 2 g in sodium chloride  0.9 % 100 mL IVPB (0 g Intravenous Stopped 11/11/24 1445)  metroNIDAZOLE  (FLAGYL ) IVPB 500 mg (0 mg Intravenous Stopped 11/11/24 1515)    Clinical Course as of 11/11/24 1627  Wed Nov 11, 2024  1335 Nephrology -- aware of pt.  Recommends admission and broad spectrum abx.  [WF]    Clinical Course User Index [WF] Neldon Hamp RAMAN, [place]                                 Medical Decision Making Amount and/or Complexity of Data Reviewed Labs: ordered.  Radiology: ordered.  Risk OTC drugs. Prescription drug  management. Decision regarding hospitalization.   This patient presents to the ED for concern for fever, this involves a number of treatment options, and is a complaint that carries with it a high risk of complications and morbidity. A differential diagnosis was considered for the patient's symptoms which is discussed below:   My specific concerns for bacteremia in the setting of dialysis patient with immunocompromise.  Will test for COVID flu and influenza RSV and will also obtain urinalysis.  Patient does not make urine often.   Co morbidities: Discussed in HPI   Brief History:  Patient is a Tuesday Thursday Saturday dialysis patient last dialysis was a full session yesterday.  Patient presents emergency room today with complaints of generalized fatigue he states both arms and both legs are very weak he was having trouble getting out of bed this morning.  Denies any chest pain or difficulty breathing not coughing no congestion no urinary frequency urgency dysuria hematuria.  Does still make urine.     EMR reviewed including pt PMHx, past surgical history and past visits to ER.   See HPI for more details   Lab Tests:   I ordered and independently interpreted labs. Labs notable for Leukocytosis  Imaging Studies:  NAD. I personally reviewed all imaging studies and no acute abnormality found. I agree with radiology interpretation.    Cardiac Monitoring:  The patient was maintained on a cardiac monitor.  I personally viewed and interpreted the cardiac monitored which showed an underlying rhythm of: NSR EKG non-ischemic   Medicines ordered:  I ordered medication including Flagyl , cefepime , vancomycin , 500 mL normal saline and Tylenol  for hydration and antipyresis Reevaluation of the patient after these medicines showed that the patient improved I have reviewed the patients home medicines and have made adjustments as needed   Critical Interventions:   broad-spectrum  antibiotics   Consults/Attending Physician   I requested consultation with nephrology,  and discussed lab and imaging findings as well as pertinent plan - they recommend:    Consulted hospitalist for admission  Reevaluation:  After the interventions noted above I re-evaluated patient and found that they have :improved   Social Determinants of Health:      Problem List / ED Course:  Patient will require admission for fever and intermittent hypotension in the setting of dialysis.  Port does not appear floridly infected.  Nephrology on board.  Admitted to hospitalist broad-spectrum antibiotics ordered.   Dispostion:  After consideration of the diagnostic results and the patients response to treatment, I feel that the patent would benefit from admission.    Final diagnoses:  Fever, unknown origin    ED Discharge Orders     None          Neldon Hamp RAMAN, [place] 11/11/24 1630    Towana Ozell BROCKS, MD 11/11/24 1745  "

## 2024-11-11 NOTE — Consult Note (Signed)
 [facility] Renal Consultation Note    Indication for Consultation:  Management of ESRD/hemodialysis, anemia, hypertension/volume, and secondary hyperparathyroidism. PCP:  HPI: Brendan [name]. is a 68 y.o. male with AKI on HD, HTN, T2DM, recent admit for head contusion s/p fall who is being admitted with fever, leukocytosis, and weakness concerning for sepsis.  Presented to ED this AM via EMS with complaint of weakness, found to be slightly hypoxic. Has noticed some mild dyspnea, not worse than usual, no CP or abdominal pain. Vitals in ED showed 101.103F fever, BP 98/47. Labs with Na 128, K 4.3, BUN 28, Cr 3.82, WBC 21.6, Hgb 9.7. Flu/COVID negative. CXR clear. Blood Cx collected and started on IV abx.  Seen in ED room. Started on HD 08/2024 after AKI on CKD event in setting of sepsis with necrotizing fasciitis. Per [facility] surgery note 10/13/24, the wound is healing well. Picture in media from 11/04/24 confirms.  Dialyzing on MTu[facility] schedule at [facility] for now. S/p HD 12/22, 12/23 this week using TDC.    Past Medical History:  Diagnosis Date   Acanthosis nigricans    Atopic dermatitis    CHF (congestive heart failure) (HCC)    CKD (chronic kidney disease) stage 4, GFR 15-29 ml/min (HCC) 01/08/2023   Diabetes (HCC) 11/19/2002   Erectile dysfunction    Fatty liver 11/19/2005   Glaucoma 11/19/2006   Gynecomastia 11/20/2007   Hx of adenomatous colonic polyps 08/26/2023   Hyperaldosteronism 11/19/1998   Hypercholesterolemia    Hyperlipidemia 2010   Hypertension 1999   Hypoglycemic reaction    Hypothyroidism 11/19/2002   Incontinence    Intermittent vertigo 11/19/2010   Left cervical radiculopathy 11/19/2010   Lumbar radiculopathy    Obesity    Peripheral neuropathy    Pollen allergies 11/19/2005   perennial   Prostate cancer (HCC) 11/19/2004   Reflux esophagitis 11/19/1993   Sickle cell trait 11/19/2004   Sleep apnea, obstructive 11/19/1998   uses a cpap    Venous insufficiency 11/20/2003   Vitamin D deficiency 11/19/2010   Past Surgical History:  Procedure Laterality Date   COLONOSCOPY  2006   normal   INCISION AND DRAINAGE OF WOUND N/A 08/28/2024   Procedure: IRRIGATION AND EXCISIONAL DEBRIDEMENT WOUND;  Surgeon: Lyndel Deward PARAS, MD;  Location: [facility] OR;  Service: General;  Laterality: N/A;  EXCISIONAL DEBRIDEMENT   INCISION AND DRAINAGE PERIRECTAL ABSCESS N/A 08/30/2024   Procedure: INCISION AND DRAINAGE OF PERINEAL WOUND;  Surgeon: Polly Cordella LABOR, MD;  Location: [facility] OR;  Service: General;  Laterality: N/A;   IR TUNNELED CENTRAL VENOUS CATH PLC W IMG  09/18/2024   IR TUNNELED CENTRAL VENOUS CATH PLC W IMG  09/22/2024   lap band surgery  2009   left inguinal hernia repair  1998   LIPOMA EXCISION Left 04/17/2023   Procedure: MINOR EXCISION LEFT BUTTOCK SEBACEOUS CYST;  Surgeon: Lyndel Deward PARAS, MD;  Location: [facility];  Service: General;  Laterality: Left;   robotic prostatectomy  2008   Family History  Problem Relation Age of Onset   Heart failure Mother    Hypertension Father        deceased age 29   Heart attack Father    COPD Sister    CVA Sister    Diabetes Daughter    Colon cancer Neg Hx    Stomach cancer Neg Hx    Colon polyps Neg Hx    Esophageal cancer Neg Hx    Rectal cancer Neg Hx  Social History:  reports that he has never smoked. He has never used smokeless tobacco. He reports current alcohol use. He reports that he does not use drugs.  ROS: As per HPI otherwise negative.  Physical Exam: Vitals:   11/11/24 1200 11/11/24 1215 11/11/24 1245 11/11/24 1420  BP: (!) 102/53 (!) 97/45  (!) 111/55  Pulse: 78 77 77 75  Resp: 16  18 15   Temp:    97.8 F (36.6 C)  TempSrc:    Oral  SpO2: 94% 92% 90% 95%     General: Ill appearing man, NAD. Facial bruising. [place] O2 in place Head: Normocephalic, atraumatic, sclera non-icteric, mucus membranes are moist. Neck: Supple without  lymphadenopathy/masses. JVD not elevated. Lungs: L base rales, otherwise clear Heart: RRR with normal S1, S2. No murmurs, rubs, or gallops appreciated. Abdomen: Soft, non-tender, non-distended with normoactive bowel sounds. Musculoskeletal:  Strength and tone appear normal for age. Lower extremities: No edema or ischemic changes, no open wounds. Neuro: Alert and oriented X 3. Moves all extremities spontaneously. Psych:  Responds to questions appropriately with a normal affect. Dialysis Access: [facility] in R chest  Allergies[1] Prior to Admission medications  Medication Sig Start Date End Date Taking? Authorizing Provider  acetaminophen  (TYLENOL ) 500 MG tablet Take 500 mg by mouth every 6 (six) hours as needed for mild pain.    [provider]  amLODipine  (NORVASC ) 5 MG tablet Take 1 tablet (5 mg total) by mouth daily. 10/21/24     bimekizumab-bkzx (BIMZELX) 160 MG/ML prefilled syringe Inject 1 Syringe into the skin every 30 (thirty) days. 08/24/24   [provider]  carvedilol  (COREG ) 12.5 MG tablet Take 1 tablet (12.5 mg total) by mouth 2 (two) times daily with a meal. Follow your blood pressure outpatient and get refills and adjustments with your PCP outpatient 09/22/24 11/04/24  Perri DELENA Meliton Mickey., MD  clobetasol  ointment (TEMOVATE ) 0.05 % Apply topically daily before sleeping. Patient not taking: Reported on 08/29/2024 05/19/24     Continuous Glucose Sensor (FREESTYLE LIBRE 3 PLUS SENSOR) MISC Apply to upper back of arm. Change every 15 days. 04/03/24     Continuous Glucose Sensor (FREESTYLE LIBRE 3 SENSOR) MISC Apply to upper back of arm and change ever 14 days. 01/28/24   Faythe Purchase, MD  dorzolamide  (TRUSOPT ) 2 % ophthalmic solution Place 1 drop into both eyes daily.    [provider]  insulin  glargine (LANTUS  SOLOSTAR) 100 UNIT/ML Solostar Pen Inject 15 Units into the skin daily. Follow up with your PCP outpatient for further adjustment to your regimen Patient  taking differently: Inject 20 Units into the skin daily. Follow up with your PCP outpatient for further adjustment to your regimen 09/03/24   Perri DELENA Meliton Mickey., MD  insulin  lispro (HUMALOG  KWIKPEN) 200 UNIT/ML KwikPen Inject 0-15 Units into the skin 3 (three) times daily with meals. CBG < 70: treat low blood sugar CBG 70 - 120: 0 units  CBG 121 - 150: 2 units  CBG 151 - 200: 3 units  CBG 201 - 250: 5 units  CBG 251 - 300: 8 units  CBG 301 - 350: 11 units  CBG 351 - 400: 15 units  CBG >400: Call MD 09/03/24   Perri DELENA Meliton Mickey., MD  Insulin  Pen Needle (TECHLITE PEN NEEDLES) 32G X 4 MM MISC Use to inject insulin  4 times daily 10/25/22   Faythe Purchase, MD  Insulin  Pen Needle 32G X 4 MM MISC Use to inject insulin  up to 4  times daily. 12/26/23   Faythe Purchase, MD  levothyroxine  (SYNTHROID ) 125 MCG tablet Take 1 tablet (125 mcg total) by mouth every morning on an empty stomach 06/08/24     simvastatin  (ZOCOR ) 20 MG tablet Take 1 tablet (20 mg total) by mouth every evening. 07/09/24     timolol  (BETIMOL ) 0.5 % ophthalmic solution Place 1 drop into both eyes daily.    [provider]  tirzepatide  (MOUNJARO ) 15 MG/0.5ML Pen Inject 15 mg into the skin once a week. 04/14/24     torsemide  (DEMADEX ) 100 MG tablet Take 1 tablet (100 mg total) by mouth in the morning except on dialysis days 10/09/24   Marlee Bernardino NOVAK, MD   Current Facility-Administered Medications  Medication Dose Route Frequency Provider Last Rate Last Admin   metroNIDAZOLE  (FLAGYL ) IVPB 500 mg  500 mg Intravenous Once Fondaw, Wylder S, PA 100 mL/hr at 11/11/24 1415 500 mg at 11/11/24 1415   vancomycin  (VANCOREADY) IVPB 2000 mg/400 mL  2,000 mg Intravenous Once Butler, Michael C, MD       Current Outpatient Medications  Medication Sig Dispense Refill   acetaminophen  (TYLENOL ) 500 MG tablet Take 500 mg by mouth every 6 (six) hours as needed for mild pain.     amLODipine  (NORVASC ) 5 MG tablet Take 1 tablet (5 mg total) by mouth  daily. 90 tablet 3   bimekizumab-bkzx (BIMZELX) 160 MG/ML prefilled syringe Inject 1 Syringe into the skin every 30 (thirty) days.     carvedilol  (COREG ) 12.5 MG tablet Take 1 tablet (12.5 mg total) by mouth 2 (two) times daily with a meal. Follow your blood pressure outpatient and get refills and adjustments with your PCP outpatient 60 tablet 0   clobetasol  ointment (TEMOVATE ) 0.05 % Apply topically daily before sleeping. (Patient not taking: Reported on 08/29/2024) 60 g 1   Continuous Glucose Sensor (FREESTYLE LIBRE 3 PLUS SENSOR) MISC Apply to upper back of arm. Change every 15 days. 2 each 12   Continuous Glucose Sensor (FREESTYLE LIBRE 3 SENSOR) MISC Apply to upper back of arm and change ever 14 days. 6 each 3   dorzolamide  (TRUSOPT ) 2 % ophthalmic solution Place 1 drop into both eyes daily.     insulin  glargine (LANTUS  SOLOSTAR) 100 UNIT/ML Solostar Pen Inject 15 Units into the skin daily. Follow up with your PCP outpatient for further adjustment to your regimen (Patient taking differently: Inject 20 Units into the skin daily. Follow up with your PCP outpatient for further adjustment to your regimen)     insulin  lispro (HUMALOG  KWIKPEN) 200 UNIT/ML KwikPen Inject 0-15 Units into the skin 3 (three) times daily with meals. CBG < 70: treat low blood sugar CBG 70 - 120: 0 units  CBG 121 - 150: 2 units  CBG 151 - 200: 3 units  CBG 201 - 250: 5 units  CBG 251 - 300: 8 units  CBG 301 - 350: 11 units  CBG 351 - 400: 15 units  CBG >400: Call MD     Insulin  Pen Needle (TECHLITE PEN NEEDLES) 32G X 4 MM MISC Use to inject insulin  4 times daily 400 each 4   Insulin  Pen Needle 32G X 4 MM MISC Use to inject insulin  up to 4 times daily. 400 each 4   levothyroxine  (SYNTHROID ) 125 MCG tablet Take 1 tablet (125 mcg total) by mouth every morning on an empty stomach 30 tablet 11   simvastatin  (ZOCOR ) 20 MG tablet Take 1 tablet (20 mg total) by mouth every  evening. 90 tablet 3   timolol  (BETIMOL ) 0.5 %  ophthalmic solution Place 1 drop into both eyes daily.     tirzepatide  (MOUNJARO ) 15 MG/0.5ML Pen Inject 15 mg into the skin once a week. 6 mL 4   torsemide  (DEMADEX ) 100 MG tablet Take 1 tablet (100 mg total) by mouth in the morning except on dialysis days 30 tablet 3   Labs: Basic Metabolic Panel: Recent Labs  Lab 11/05/24 0847 11/06/24 0638 11/11/24 1120  NA 144 140 128*  K 3.7 4.0 4.3  CL 105 102 92*  CO2 26 25 24   GLUCOSE 186* 375* 380*  BUN 20 29* 29*  CREATININE 3.16* 3.72* 3.82*  CALCIUM 8.9 8.7* 8.1*   Liver Function Tests: Recent Labs  Lab 11/11/24 1120  AST 17  ALT 10  ALKPHOS 116  BILITOT 1.2  PROT 6.1*  ALBUMIN  2.5*   CBC: Recent Labs  Lab 11/11/24 1120  WBC 21.6*  HGB 9.7*  HCT 28.3*  MCV 84.2  PLT 162   CBG: Recent Labs  Lab 11/05/24 1928 11/05/24 2309 11/06/24 0301 11/06/24 0830 11/06/24 1238  GLUCAP 327* 285* 369* 411* 355*   Studies/Results: DG Chest 2 View Result Date: 11/11/2024 CLINICAL DATA:  Chronic fatigue. EXAM: CHEST - 2 VIEW COMPARISON:  November 03, 2024 FINDINGS: There is stable right-sided internal jugular hemodialysis catheter positioning. The cardiac silhouette is mildly enlarged and unchanged in size. Mild atelectatic changes are suspected within the left lung base. No acute infiltrate, pleural effusion or pneumothorax is identified. The visualized skeletal structures are unremarkable. IMPRESSION: Stable cardiomegaly without acute cardiopulmonary disease. Electronically Signed   By: Suzen Dials M.D.   On: 11/11/2024 10:04    Dialysis Orders:  MTuThF - [facility] 3hr, 400/A1.5, EDW 125kg, 3K/2.5Ca bath, TDC, heparin  5000 unit bolus - Mircera 150mcg IV q 2 weeks (100mcg given 12/5) - Calcitriol  0.5mcg PO q HD  Assessment/Plan:  Sepsis: Blood Cx pending. On IV abx. CXR clear. WBC high. Prior perineal wound not examined by myself.  HD dependent AKI: On HD since 08/2024 per notes, failed trial of holding HD during recent  admit. Usual MTuThF schedule - next HD planned Friday here.  Hypertension/volume: BP soft, hold home BP meds and follow/  Anemia: Hgb 9.7 - follow.  Metabolic bone disease: Ca ok, Phos pending. Follow.  T2DM  Recent head contusion  Izetta Boehringer, PA-C 11/11/2024, 2:51 PM  [facility]     [1]  Allergies Allergen Reactions   Ace Inhibitors Cough   Maxidex  [Dexamethasone ] Other (See Comments)    Sexual dysfunction   Verapamil Other (See Comments)    constipation

## 2024-11-12 ENCOUNTER — Inpatient Hospital Stay (HOSPITAL_COMMUNITY)

## 2024-11-12 DIAGNOSIS — B9561 Methicillin susceptible Staphylococcus aureus infection as the cause of diseases classified elsewhere: Secondary | ICD-10-CM | POA: Diagnosis not present

## 2024-11-12 DIAGNOSIS — E1122 Type 2 diabetes mellitus with diabetic chronic kidney disease: Secondary | ICD-10-CM | POA: Diagnosis not present

## 2024-11-12 DIAGNOSIS — Z992 Dependence on renal dialysis: Secondary | ICD-10-CM

## 2024-11-12 DIAGNOSIS — L408 Other psoriasis: Secondary | ICD-10-CM

## 2024-11-12 DIAGNOSIS — B964 Proteus (mirabilis) (morganii) as the cause of diseases classified elsewhere: Secondary | ICD-10-CM | POA: Diagnosis not present

## 2024-11-12 DIAGNOSIS — A419 Sepsis, unspecified organism: Secondary | ICD-10-CM | POA: Diagnosis not present

## 2024-11-12 DIAGNOSIS — I38 Endocarditis, valve unspecified: Secondary | ICD-10-CM | POA: Diagnosis not present

## 2024-11-12 DIAGNOSIS — N186 End stage renal disease: Secondary | ICD-10-CM | POA: Diagnosis not present

## 2024-11-12 LAB — RESPIRATORY PANEL BY PCR

## 2024-11-12 LAB — GLUCOSE, CAPILLARY
Glucose-Capillary: 524 mg/dL (ref 70–99)
Glucose-Capillary: 530 mg/dL (ref 70–99)
Glucose-Capillary: 415 mg/dL — ABNORMAL HIGH (ref 70–99)
Glucose-Capillary: 428 mg/dL — ABNORMAL HIGH (ref 70–99)
Glucose-Capillary: 443 mg/dL — ABNORMAL HIGH (ref 70–99)
Glucose-Capillary: 469 mg/dL — ABNORMAL HIGH (ref 70–99)
Glucose-Capillary: 386 mg/dL — ABNORMAL HIGH (ref 70–99)
Glucose-Capillary: 319 mg/dL — ABNORMAL HIGH (ref 70–99)
Glucose-Capillary: 241 mg/dL — ABNORMAL HIGH (ref 70–99)
Glucose-Capillary: 184 mg/dL — ABNORMAL HIGH (ref 70–99)
Glucose-Capillary: 153 mg/dL — ABNORMAL HIGH (ref 70–99)
Glucose-Capillary: 296 mg/dL — ABNORMAL HIGH (ref 70–99)
Glucose-Capillary: 257 mg/dL — ABNORMAL HIGH (ref 70–99)

## 2024-11-12 LAB — BLOOD CULTURE ID PANEL (REFLEXED) - BCID2
A.calcoaceticus-baumannii: NOT DETECTED
Bacteroides fragilis: NOT DETECTED
CTX-M ESBL: NOT DETECTED
Candida albicans: NOT DETECTED
Candida auris: NOT DETECTED
Candida glabrata: NOT DETECTED
Candida krusei: NOT DETECTED
Candida parapsilosis: NOT DETECTED
Candida tropicalis: NOT DETECTED
Carbapenem resist OXA 48 LIKE: NOT DETECTED
Carbapenem resistance IMP: NOT DETECTED
Carbapenem resistance KPC: NOT DETECTED
Carbapenem resistance NDM: NOT DETECTED
Carbapenem resistance VIM: NOT DETECTED
Cryptococcus neoformans/gattii: NOT DETECTED
Enterobacter cloacae complex: NOT DETECTED
Enterobacterales: DETECTED — AB
Enterococcus Faecium: NOT DETECTED
Enterococcus faecalis: NOT DETECTED
Escherichia coli: NOT DETECTED
Haemophilus influenzae: NOT DETECTED
Klebsiella aerogenes: NOT DETECTED
Klebsiella oxytoca: NOT DETECTED
Klebsiella pneumoniae: NOT DETECTED
Listeria monocytogenes: NOT DETECTED
Meth resistant mecA/C and MREJ: NOT DETECTED
Neisseria meningitidis: NOT DETECTED
Proteus species: DETECTED — AB
Pseudomonas aeruginosa: NOT DETECTED
Salmonella species: NOT DETECTED
Serratia marcescens: NOT DETECTED
Staphylococcus aureus (BCID): DETECTED — AB
Staphylococcus epidermidis: NOT DETECTED
Staphylococcus lugdunensis: NOT DETECTED
Staphylococcus species: DETECTED — AB
Stenotrophomonas maltophilia: NOT DETECTED
Streptococcus agalactiae: NOT DETECTED
Streptococcus pneumoniae: NOT DETECTED
Streptococcus pyogenes: NOT DETECTED
Streptococcus species: NOT DETECTED

## 2024-11-12 LAB — RENAL FUNCTION PANEL
Albumin: 2.5 g/dL — ABNORMAL LOW (ref 3.5–5.0)
Anion gap: 13 (ref 5–15)
BUN: 41 mg/dL — ABNORMAL HIGH (ref 8–23)
CO2: 23 mmol/L (ref 22–32)
Calcium: 8.3 mg/dL — ABNORMAL LOW (ref 8.9–10.3)
Chloride: 90 mmol/L — ABNORMAL LOW (ref 98–111)
Creatinine, Ser: 4.49 mg/dL — ABNORMAL HIGH (ref 0.61–1.24)
GFR, Estimated: 13 mL/min — ABNORMAL LOW
Glucose, Bld: 531 mg/dL (ref 70–99)
Phosphorus: 4.1 mg/dL (ref 2.5–4.6)
Potassium: 4.2 mmol/L (ref 3.5–5.1)
Sodium: 126 mmol/L — ABNORMAL LOW (ref 135–145)

## 2024-11-12 LAB — BASIC METABOLIC PANEL WITH GFR
Anion gap: 14 (ref 5–15)
BUN: 41 mg/dL — ABNORMAL HIGH (ref 8–23)
CO2: 21 mmol/L — ABNORMAL LOW (ref 22–32)
Calcium: 8.3 mg/dL — ABNORMAL LOW (ref 8.9–10.3)
Chloride: 90 mmol/L — ABNORMAL LOW (ref 98–111)
Creatinine, Ser: 4.54 mg/dL — ABNORMAL HIGH (ref 0.61–1.24)
GFR, Estimated: 13 mL/min — ABNORMAL LOW
Glucose, Bld: 495 mg/dL — ABNORMAL HIGH (ref 70–99)
Potassium: 4.1 mmol/L (ref 3.5–5.1)
Sodium: 124 mmol/L — ABNORMAL LOW (ref 135–145)

## 2024-11-12 LAB — CBC
HCT: 28.4 % — ABNORMAL LOW (ref 39.0–52.0)
Hemoglobin: 9.6 g/dL — ABNORMAL LOW (ref 13.0–17.0)
MCH: 28.1 pg (ref 26.0–34.0)
MCHC: 33.8 g/dL (ref 30.0–36.0)
MCV: 83 fL (ref 80.0–100.0)
Platelets: 145 K/uL — ABNORMAL LOW (ref 150–400)
RBC: 3.42 MIL/uL — ABNORMAL LOW (ref 4.22–5.81)
RDW: 16.5 % — ABNORMAL HIGH (ref 11.5–15.5)
WBC: 20.2 K/uL — ABNORMAL HIGH (ref 4.0–10.5)
nRBC: 0 % (ref 0.0–0.2)

## 2024-11-12 LAB — ECHOCARDIOGRAM COMPLETE BUBBLE STUDY
Area-P 1/2: 1.85 cm2
S' Lateral: 5.5 cm

## 2024-11-12 LAB — HEPATITIS B SURFACE ANTIGEN: Hepatitis B Surface Ag: NONREACTIVE

## 2024-11-12 LAB — TSH: TSH: 1.14 u[IU]/mL (ref 0.350–4.500)

## 2024-11-12 LAB — MRSA NEXT GEN BY PCR, NASAL: MRSA by PCR Next Gen: NOT DETECTED

## 2024-11-12 MED ORDER — SODIUM CHLORIDE 0.9% FLUSH: 10.0000 mL | INTRAVENOUS | Status: AC | PRN

## 2024-11-12 MED ORDER — LACTATED RINGERS IV SOLN: INTRAVENOUS | Status: DC

## 2024-11-12 MED ORDER — CHLORHEXIDINE GLUCONATE CLOTH 2 % EX PADS
6.0000 | MEDICATED_PAD | Freq: Every day | CUTANEOUS | Status: DC
  Administered 2024-11-12 – 2024-11-20 (×9): 6 via TOPICAL

## 2024-11-12 MED ORDER — INSULIN ASPART 100 UNIT/ML IJ SOLN
4.0000 [IU] | Freq: Once | INTRAMUSCULAR | Status: AC
  Administered 2024-11-12: 4 [IU] via INTRAVENOUS
  Filled 2024-11-12: qty 4

## 2024-11-12 MED ORDER — INSULIN REGULAR(HUMAN) IN NACL 100-0.9 UT/100ML-% IV SOLN
INTRAVENOUS | Status: DC
  Administered 2024-11-12: 11.5 [IU]/h via INTRAVENOUS
  Filled 2024-11-12 (×2): qty 100

## 2024-11-12 MED ORDER — SODIUM CHLORIDE 0.9% FLUSH
10.0000 mL | Freq: Two times a day (BID) | INTRAVENOUS | Status: AC
  Administered 2024-11-12 – 2024-11-23 (×16): 10 mL

## 2024-11-12 MED ORDER — DEXTROSE 50 % IV SOLN: 0.0000 mL | INTRAVENOUS | Status: DC | PRN

## 2024-11-12 MED ORDER — INSULIN ASPART 100 UNIT/ML IJ SOLN
2.0000 [IU] | Freq: Three times a day (TID) | INTRAMUSCULAR | Status: DC
  Administered 2024-11-12 – 2024-11-16 (×6): 2 [IU] via SUBCUTANEOUS
  Filled 2024-11-12 (×2): qty 2
  Filled 2024-11-12 (×2): qty 1
  Filled 2024-11-12: qty 2
  Filled 2024-11-12: qty 1
  Filled 2024-11-12 (×2): qty 2
  Filled 2024-11-12: qty 1
  Filled 2024-11-12: qty 2

## 2024-11-12 MED ORDER — INSULIN ASPART 100 UNIT/ML IJ SOLN
0.0000 [IU] | Freq: Three times a day (TID) | INTRAMUSCULAR | Status: AC
  Administered 2024-11-12: 1 [IU] via SUBCUTANEOUS
  Administered 2024-11-12: 3 [IU] via SUBCUTANEOUS
  Administered 2024-11-13: 4 [IU] via SUBCUTANEOUS
  Administered 2024-11-13 (×2): 2 [IU] via SUBCUTANEOUS
  Administered 2024-11-14 (×2): 3 [IU] via SUBCUTANEOUS
  Administered 2024-11-14: 5 [IU] via SUBCUTANEOUS
  Administered 2024-11-15: 4 [IU] via SUBCUTANEOUS
  Administered 2024-11-15 (×2): 3 [IU] via SUBCUTANEOUS
  Administered 2024-11-16: 4 [IU] via SUBCUTANEOUS
  Administered 2024-11-16: 3 [IU] via SUBCUTANEOUS
  Administered 2024-11-16: 4 [IU] via SUBCUTANEOUS
  Administered 2024-11-17 (×2): 3 [IU] via SUBCUTANEOUS
  Administered 2024-11-18 (×2): 2 [IU] via SUBCUTANEOUS
  Administered 2024-11-19: 1 [IU] via SUBCUTANEOUS
  Administered 2024-11-19: 2 [IU] via SUBCUTANEOUS
  Administered 2024-11-20: 3 [IU] via SUBCUTANEOUS
  Administered 2024-11-20 – 2024-11-21 (×3): 2 [IU] via SUBCUTANEOUS
  Administered 2024-11-22: 4 [IU] via SUBCUTANEOUS
  Administered 2024-11-22 (×2): 2 [IU] via SUBCUTANEOUS
  Administered 2024-11-23 (×2): 1 [IU] via SUBCUTANEOUS
  Filled 2024-11-12: qty 3
  Filled 2024-11-12: qty 4
  Filled 2024-11-12: qty 1
  Filled 2024-11-12: qty 3
  Filled 2024-11-12: qty 1
  Filled 2024-11-12: qty 3
  Filled 2024-11-12: qty 1
  Filled 2024-11-12: qty 2
  Filled 2024-11-12 (×2): qty 1
  Filled 2024-11-12: qty 3
  Filled 2024-11-12: qty 2
  Filled 2024-11-12: qty 3

## 2024-11-12 MED ORDER — CHLORHEXIDINE GLUCONATE CLOTH 2 % EX PADS
6.0000 | MEDICATED_PAD | Freq: Every day | CUTANEOUS | Status: DC
  Administered 2024-11-13 – 2024-11-17 (×5): 6 via TOPICAL

## 2024-11-12 MED ORDER — DEXTROSE IN LACTATED RINGERS 5 % IV SOLN: INTRAVENOUS | Status: DC

## 2024-11-12 MED ORDER — PERFLUTREN LIPID MICROSPHERE
1.0000 mL | INTRAVENOUS | Status: AC | PRN
  Administered 2024-11-12: 3 mL via INTRAVENOUS

## 2024-11-12 MED ORDER — INSULIN GLARGINE 100 UNIT/ML ~~LOC~~ SOLN
12.0000 [IU] | Freq: Every day | SUBCUTANEOUS | Status: DC
  Administered 2024-11-12 – 2024-11-13 (×2): 12 [IU] via SUBCUTANEOUS
  Filled 2024-11-12 (×2): qty 0.12

## 2024-11-12 NOTE — TOC Initial Note (Signed)
 Transition of Care (TOC) - Initial/Assessment Note    Patient Details  Name: Brendan [name]. MRN: [id number] Date of Birth: [birth year]  Transition of [facility]) CM/SW Contact:    Lauraine FORBES Saa, LCSWA Phone Number: 11/12/2024, 2:41 PM  Clinical Narrative:                  2:41 PM Per chart review, patient resides at home with spouse. Patient has a PCP and insurance. Patient is currently active with [facility] for PT/OT/RN. Patient has DME (BSC, rollator, CPAP, scale) history with [facility]. Patient does not have SNF history. Patient's preferred pharmacy's are [facility], [facility], and [facility]. TOC will continue to follow.  Expected Discharge Plan: Home w Home Health Services Barriers to Discharge: Continued Medical Work up   Patient Goals and CMS Choice            Expected Discharge Plan and Services   Discharge Planning Services: CM Consult   Living arrangements for the past 2 months: Single Family Home                                      Prior Living Arrangements/Services Living arrangements for the past 2 months: Single Family Home Lives with:: Spouse Patient language and need for interpreter reviewed:: Yes          Care giver support system in place?: Yes (comment) Current home services: DME, Home PT, Home OT, Home RN Criminal Activity/Legal Involvement Pertinent to Current Situation/Hospitalization: No - Comment as needed  Activities of Daily Living      Permission Sought/Granted                  Emotional Assessment       Orientation: : Oriented to Self, Oriented to  Time, Oriented to Situation, Oriented to Place Alcohol / Substance Use: Not Applicable Psych Involvement: No (comment)  Admission diagnosis:  Fever, unknown origin [R50.9] Sepsis (HCC) [A41.9] Patient Active Problem List   Diagnosis Date Noted   Hypotension 11/11/2024    History of traumatic brain injury 11/11/2024   Heart failure with preserved ejection fraction (HCC) 11/11/2024   Open wound 11/11/2024   TBI (traumatic brain injury) (HCC) 11/03/2024   Acute on chronic heart failure with preserved ejection fraction (HFpEF, >= 50%) (HCC) 09/15/2024   Hyperlipidemia associated with type 2 diabetes mellitus (HCC) 09/15/2024   Anemia of chronic disease 09/15/2024   Sepsis (HCC) 08/29/2024   Necrotizing fasciitis (HCC) 08/28/2024   Hx of adenomatous colonic polyps 08/26/2023   Research study patient 01/09/2023   Acute on chronic congestive heart failure (HCC) 01/09/2023   ESRD on dialysis (HCC) 01/08/2023   Heart failure, systolic, acute (HCC) 03/26/2020   Acute on chronic diastolic CHF (congestive heart failure) (HCC) 03/25/2020   Hypokalemia 03/25/2020   Leukocytosis 03/25/2020   Renal insufficiency 03/25/2020   Chest pain 03/25/2020   Hypothyroidism 03/25/2020   Gout attack 03/25/2020   Morbid obesity (HCC) 10/28/2015   Internal hemorrhoids with bleeding 07/31/2013   Insulin -requiring or dependent type II diabetes mellitus (HCC) 01/11/2010   OSA (obstructive sleep apnea) 01/11/2010   Hypertension associated with diabetes (HCC) 01/11/2010   GLAUCOMA 01/10/2010   Allergic rhinitis 01/10/2010   PROSTATE CANCER, HX OF 01/10/2010   PCP:  Rexanne Ingle, MD Pharmacy:   [facility] [address] [place] [address] Phone: [phone] Fax: [phone]  [facility] [address] [place] [address] Phone: [phone] Fax: [phone]  [facility] [address] [place] [address] Phone: [phone] Fax: [phone]     Social Drivers of Health (SDOH) Social History: SDOH Screenings   Food Insecurity: No Food Insecurity (11/12/2024)  Housing: Low Risk (11/12/2024)  Transportation Needs: No Transportation Needs  (11/12/2024)  Utilities: At Risk (11/12/2024)  Financial Resource Strain: Low Risk (01/08/2023)  Social Connections: Socially Integrated (11/12/2024)  Tobacco Use: Low Risk (11/03/2024)   SDOH Interventions: Utilities Interventions: [employer] Provided, Inpatient TOC   Readmission Risk Interventions    11/06/2024   11:47 AM 09/01/2024   11:34 AM  Readmission Risk Prevention Plan  Transportation Screening Complete Complete  Home Care Screening  Complete  Medication Review (RN CM)  Referral to Pharmacy  HRI or Home Care Consult Complete   Social Work Consult for Recovery Care Planning/Counseling Complete   Palliative Care Screening Not Applicable   Medication Review Oceanographer) Complete

## 2024-11-12 NOTE — Progress Notes (Addendum)
"  ° °      CROSS COVER NOTE  NAME: Brendan [name]. MRN: [id number] DOB : 10/19/1956    Concern as stated by nurse / staff   hyperglycemia     Pertinent findings on chart review: Admitted on 12/24 with sepsis of unclear source and uncontrolled diabetes.  History of ESRD on HD  Patient Assessment CBG (last 3)  Recent Labs    11/11/24 1744 11/11/24 2319 11/12/24 0105  GLUCAP 515* 494* 524*      Latest Ref Rng & Units 11/12/2024    3:07 AM 11/11/2024   11:20 AM 11/06/2024    6:38 AM  BMP  Glucose 70 - 99 mg/dL 468  619  624   BUN 8 - 23 mg/dL 41  29  29   Creatinine 0.61 - 1.24 mg/dL 5.50  6.17  6.27   Sodium 135 - 145 mmol/L 126  128  140   Potassium 3.5 - 5.1 mmol/L 4.2  4.3  4.0   Chloride 98 - 111 mmol/L 90  92  102   CO2 22 - 32 mmol/L 23  24  25    Calcium 8.9 - 10.3 mg/dL 8.3  8.1  8.7       Assessment and  Interventions   Assessment:  Persistent hyperglycemia, type 2, not DKA  Plan: Patient treated with IV NovoLog  bolus x 2 and IV fluid bolus but blood sugar remains in the 500s Decision to transfer patient to PCU for IV insulin  infusion--IV fluid adjustment in keeping with ESRD status        "

## 2024-11-12 NOTE — Consult Note (Signed)
 "        [facility]    Date of Admission:  11/11/2024   Total days of inpatient antibiotics 1        Reason for Consult: MSSA bacteremia    Principal Problem:   Sepsis (HCC) Active Problems:   Insulin -requiring or dependent type II diabetes mellitus (HCC)   OSA (obstructive sleep apnea)   Hypothyroidism   ESRD on dialysis (HCC)   Hyperlipidemia associated with type 2 diabetes mellitus (HCC)   Anemia of chronic disease   Hypotension   History of traumatic brain injury   Heart failure with preserved ejection fraction [facility])   Open wound   Assessment: 68 year old male with ESRD on HD, hypertension hyperlipidemia, diabetes, hypothyroidism, nec fasciitis of scrotum status post I&D discharged on Augmentin  through 10/26, recent admission 12/16 - 12/19  for concern of traumatic brain injury MRI showing subarachnoid hemorrhage, trace intraventricular hemorrhage, meningioma neurosurgery was consulted recommend antiseizure medicine found to have #MSSA and Proteus mirabilis bacteremia 2/2 hd line  vs leess likely b/l palms - Patient presented with lack of energy, low blood pressure.  Found to have a temp of 101, WBC 21K.  Chest x-ray without acute abnormality.  Admitted with sepsis secondary to unclear etiology - Blood cultures from admission grew MSSA and Proteus.  ID out of consulted.  #ESRD on HD - via HD catheter  #Psoriasis b/l palms -does not look actively infected  Recommendations:  -Continue cefepime  -Repeat blood Cx to ensure clearance tomorrow -TTE, will need tee - Remove lines including HD catheter -Standard precautions  Microbiology:   Antibiotics: 12/24 cefepime -12 /24 vanc+ metronidazole   Cultures: Blood 12/24 gnr/gpc + mssa/proteus on bicid Urine  Other   HPI: Brendan [name]. is a 68 y.o. male with ESRD  on HD, HTN, HLD, DM2, hypothyroidism and obesity prsents with low blood pressure and lack of energy.  Recent hospitalization  11-03-2018 after a fall with concern for traumatic brain injury.  CT brain revealed right frontal extra-axial mass consistent meningioma.  MRI showing hemorrhagic contusions, subarachnoid hemorrhage.  Neurosurgery had been consulted to, temporarily on antiseizure medication.  In the ED patient had temp of 101.1, WBC 21K chest x-ray negative.Admitted for sepsis secondary to unclear source.  ID also consulted current blood cultures grew MSSA and Proteus   Review of Systems: Review of Systems  All other systems reviewed and are negative.   Past Medical History:  Diagnosis Date   Acanthosis nigricans    Atopic dermatitis    CHF (congestive heart failure) (HCC)    CKD (chronic kidney disease) stage 4, GFR 15-29 ml/min (HCC) 01/08/2023   Diabetes (HCC) 11/19/2002   Erectile dysfunction    Fatty liver 11/19/2005   Glaucoma 11/19/2006   Gynecomastia 11/20/2007   Hx of adenomatous colonic polyps 08/26/2023   Hyperaldosteronism 11/19/1998   Hypercholesterolemia    Hyperlipidemia 2010   Hypertension 1999   Hypoglycemic reaction    Hypothyroidism 11/19/2002   Incontinence    Intermittent vertigo 11/19/2010   Left cervical radiculopathy 11/19/2010   Lumbar radiculopathy    Obesity    Peripheral neuropathy    Pollen allergies 11/19/2005   perennial   Prostate cancer (HCC) 11/19/2004   Reflux esophagitis 11/19/1993   Sickle cell trait 11/19/2004   Sleep apnea, obstructive 11/19/1998   uses a cpap   Venous insufficiency 11/20/2003   Vitamin D deficiency 11/19/2010    Social History[1]  Family History  Problem Relation Age of Onset  Heart failure Mother    Hypertension Father        deceased age 83   Heart attack Father    COPD Sister    CVA Sister    Diabetes Daughter    Colon cancer Neg Hx    Stomach cancer Neg Hx    Colon polyps Neg Hx    Esophageal cancer Neg Hx    Rectal cancer Neg Hx    Scheduled Meds:  insulin  glargine  20 Units Subcutaneous Daily   levothyroxine    125 mcg Oral q AM   simvastatin   20 mg Oral QPM   sodium chloride  flush  3 mL Intravenous Q12H   Continuous Infusions:  ceFEPime  (MAXIPIME ) IV     dextrose  5% lactated ringers  50 mL/hr at 11/12/24 0413   insulin  11.5 Units/hr (11/12/24 9361)   lactated ringers  50 mL/hr at 11/12/24 0414   metronidazole  500 mg (11/11/24 2304)   PRN Meds:.acetaminophen  **OR** acetaminophen , albuterol , dextrose , ondansetron  **OR** ondansetron  (ZOFRAN ) IV Allergies[2]  OBJECTIVE: Blood pressure 129/62, pulse 64, temperature (!) 97.5 F (36.4 C), temperature source Oral, resp. rate 20, weight 128.6 kg, SpO2 100%.  Physical Exam Constitutional:      General: He is not in acute distress.    Appearance: He is normal weight. He is not toxic-appearing.  HENT:     Head: Normocephalic and atraumatic.     Right Ear: External ear normal.     Left Ear: External ear normal.     Nose: No congestion or rhinorrhea.     Mouth/Throat:     Mouth: Mucous membranes are moist.     Pharynx: Oropharynx is clear.  Eyes:     Extraocular Movements: Extraocular movements intact.     Conjunctiva/sclera: Conjunctivae normal.     Pupils: Pupils are equal, round, and reactive to light.  Cardiovascular:     Rate and Rhythm: Normal rate and regular rhythm.     Heart sounds: No murmur heard.    No friction rub. No gallop.  Pulmonary:     Effort: Pulmonary effort is normal.     Breath sounds: Normal breath sounds.  Abdominal:     General: Abdomen is flat. Bowel sounds are normal.     Palpations: Abdomen is soft.  Musculoskeletal:     Cervical back: Normal range of motion and neck supple.  Skin:    General: Skin is warm and dry.  Neurological:     General: No focal deficit present.     Mental Status: He is oriented to person, place, and time.  Psychiatric:        Mood and Affect: Mood normal.        Lab Results Lab Results  Component Value Date   WBC 20.2 (H) 11/12/2024   HGB 9.6 (L) 11/12/2024   HCT 28.4 (L)  11/12/2024   MCV 83.0 11/12/2024   PLT 145 (L) 11/12/2024    Lab Results  Component Value Date   CREATININE 4.54 (H) 11/12/2024   BUN 41 (H) 11/12/2024   NA 124 (L) 11/12/2024   K 4.1 11/12/2024   CL 90 (L) 11/12/2024   CO2 21 (L) 11/12/2024    Lab Results  Component Value Date   ALT 10 11/11/2024   AST 17 11/11/2024   ALKPHOS 116 11/11/2024   BILITOT 1.2 11/11/2024       Loney Stank, MD [facility] 11/12/2024, 7:25 AM Evaluation of this patient requires complex antimicrobial therapy evaluation  and counseling + isolation needs for disease transmission risk assessment and mitigation      [1]  Social History Tobacco Use   Smoking status: Never   Smokeless tobacco: Never  Vaping Use   Vaping status: Never Used  Substance Use Topics   Alcohol use: Yes    Comment: one drink every few months - 1965   Drug use: No  [2]  Allergies Allergen Reactions   Ace Inhibitors Cough   Maxidex  [Dexamethasone ] Other (See Comments)    Sexual dysfunction   Verapamil Other (See Comments)    constipation   "

## 2024-11-12 NOTE — Progress Notes (Signed)
" °   11/12/24 0544  Vitals  Temp (!) 97.5 F (36.4 C)  Temp Source Oral  BP 129/62  MAP (mmHg) 83  BP Location Left Arm  BP Method Automatic  Patient Position (if appropriate) Lying  Pulse Rate 64  Pulse Rate Source Monitor  ECG Heart Rate 64  Resp 20  Level of Consciousness  Level of Consciousness Alert  MEWS COLOR  MEWS Score Color Green  Oxygen Therapy  SpO2 100 %  O2 Device Room Air  PCA/Epidural/Spinal Assessment  Respiratory Pattern Regular;Unlabored  Height and Weight  Weight 128.6 kg  Type of Scale Used Bed  BMI (Calculated) 36.39  Glasgow Coma Scale  Eye Opening 4  Best Verbal Response (NON-intubated) 5  Best Motor Response 6  Glasgow Coma Scale Score 15  MEWS Score  MEWS Temp 0  MEWS Systolic 0  MEWS Pulse 0  MEWS RR 0  MEWS LOC 0  MEWS Score 0   Pt admitted to [id number]. Pt alert and oriented. CCMD called. Pt oriented to unit. Call light by pt. "

## 2024-11-12 NOTE — Progress Notes (Signed)
 PHARMACY - PHYSICIAN COMMUNICATION CRITICAL VALUE ALERT - BLOOD CULTURE IDENTIFICATION (BCID)  Brendan [name]. is an 68 y.o. male with ESRD on HD who presented to [facility] on 11/11/2024 with a chief complaint of fevers/sepsis  Assessment:   Blood cultures growing MSSA and Proteus species  Name of physician (or Provider) Contacted:  Dr. Cleatus  Current antibiotics: Vancomycin  and Cefepime    Changes to prescribed antibiotics recommended:   D/C further vancomycin   Continue Cefepime  1 g IV q24h for now.  F/U sensitivities and consider narrowing to Ancef  if Proteus susceptible  Results for orders placed or performed during the hospital encounter of 11/11/24  Blood Culture ID Panel (Reflexed) (Collected: 11/11/2024  1:29 PM)  Result Value Ref Range   Enterococcus faecalis NOT DETECTED NOT DETECTED   Enterococcus Faecium NOT DETECTED NOT DETECTED   Listeria monocytogenes NOT DETECTED NOT DETECTED   Staphylococcus species DETECTED (A) NOT DETECTED   Staphylococcus aureus (BCID) DETECTED (A) NOT DETECTED   Staphylococcus epidermidis NOT DETECTED NOT DETECTED   Staphylococcus lugdunensis NOT DETECTED NOT DETECTED   Streptococcus species NOT DETECTED NOT DETECTED   Streptococcus agalactiae NOT DETECTED NOT DETECTED   Streptococcus pneumoniae NOT DETECTED NOT DETECTED   Streptococcus pyogenes NOT DETECTED NOT DETECTED   A.calcoaceticus-baumannii NOT DETECTED NOT DETECTED   Bacteroides fragilis NOT DETECTED NOT DETECTED   Enterobacterales DETECTED (A) NOT DETECTED   Enterobacter cloacae complex NOT DETECTED NOT DETECTED   Escherichia coli NOT DETECTED NOT DETECTED   Klebsiella aerogenes NOT DETECTED NOT DETECTED   Klebsiella oxytoca NOT DETECTED NOT DETECTED   Klebsiella pneumoniae NOT DETECTED NOT DETECTED   Proteus species DETECTED (A) NOT DETECTED   Salmonella species NOT DETECTED NOT DETECTED   Serratia marcescens NOT DETECTED NOT DETECTED   Haemophilus influenzae NOT DETECTED NOT  DETECTED   Neisseria meningitidis NOT DETECTED NOT DETECTED   Pseudomonas aeruginosa NOT DETECTED NOT DETECTED   Stenotrophomonas maltophilia NOT DETECTED NOT DETECTED   Candida albicans NOT DETECTED NOT DETECTED   Candida auris NOT DETECTED NOT DETECTED   Candida glabrata NOT DETECTED NOT DETECTED   Candida krusei NOT DETECTED NOT DETECTED   Candida parapsilosis NOT DETECTED NOT DETECTED   Candida tropicalis NOT DETECTED NOT DETECTED   Cryptococcus neoformans/gattii NOT DETECTED NOT DETECTED   CTX-M ESBL NOT DETECTED NOT DETECTED   Carbapenem resistance IMP NOT DETECTED NOT DETECTED   Carbapenem resistance KPC NOT DETECTED NOT DETECTED   Meth resistant mecA/C and MREJ NOT DETECTED NOT DETECTED   Carbapenem resistance NDM NOT DETECTED NOT DETECTED   Carbapenem resist OXA 48 LIKE NOT DETECTED NOT DETECTED   Carbapenem resistance VIM NOT DETECTED NOT DETECTED    Dail Cordella Misty 11/12/2024  4:12 AM

## 2024-11-12 NOTE — Progress Notes (Signed)
 Patient was talking with daughter at bedside about pain in head, back, and legs. With numbness in legs. Patient said he fell on Tuesday, 12/23 and hit only his head. Patient says pain feels similar to how it has been since his original fall last week. No deficits noted, GCS 15. Dr. Royal notified as it does not appear he had follow-up imaging from this most recent fall.

## 2024-11-12 NOTE — Progress Notes (Signed)
 Echocardiogram 2D Echocardiogram has been performed.  Brendan [name] 11/12/2024, 12:59 PM

## 2024-11-12 NOTE — Progress Notes (Signed)
 [facility]  [name] FMW:[id number]  DOB: 30-Jun-1956  DOA: 11/11/2024  PCP: Rexanne Ingle, MD  11/12/2024,8:20 AM  LOS: 1 day    Code Status: Full code     from: Home   68 year old history of Fournier's gangrene 08/2024 status post I&D drainage wound 08/30/2024 Dr. Salley was felt secondary to use of Farxiga -cultures grew strep constellatus MSSA Prevotella + beta-lactamase positive and patient was placed on Augmentin  at time of discharge EOT 09/13/2024-discharge weight 301 10/28 readmitted SOB-AKI worsening 18 pound weight gain-cardiology nephrology consulted 2/2 NYHA IV symptoms--started on IHD status post tunneled cath 09/22/2024 12/16 readmission obtunded on the floor WBC 17,000 CT contusion with TBI?  Meningioma-Rx Keppra  and was not discharged on the same as neurology did not feel this was a necessity at that time--- was discharged 12/19  Chr med h/o--OSA HTN IDDM hypothyroid HFpEF gout  12/24 came back to the emergency room feeling weak blood pressure low has been on 4 times a week HD had a fever-CBG noted to be 380 lactic acid 1.4 Tmax 101 WBC 21.6 blood pressure 90/40 nephrology consulted-crystalloid bolus fluids given Vanco Flagyl  cefepime  added 12/25 early a.m. seem to be in DKA transferred to progressive unit 2/2 concern for DKA    Assessment  & Plan :    Sepsis MSSA/Prevotella source unclear DDx groin DDx left hand versus indwelling right HD cath [facility] Hypotensive on admission Defer to ID-continue cefepime  Flagyl  vancomycin  broad coverage, WBC elevated still ?  MRI pelvis?  TTE?  Other workup Tells me he had left hand wound week of 12/15 2025 which opened up  was using emollient no steroids on the area and it healed up okay--see below description regarding extremities  Hyperglycemia ? DKA [place] never elevated but received HD 12/24--he is hungry not nauseous-I think we can transition him to 12 units Lantus  with very sensitive sliding scale and 2 units 3 times daily meals Watch  sugars and keep on progressive unit today Resume Mounjaro  15 q. Wednesday at discharge  Immunosuppression secondary to psoriasis follows with Dr. Dianah (dermatology) for the past 6 years Has not been able to obtain Bimzelx yet Would hold clobetasol   Newly declared ESRD-dialyzing via right tunneled cath placed 09/22/2024 Nephro/ID to determine timing removal of catheter etc.--- volume, iron , bones as per renal Needs mapping etc.  HFpEF NYHA IV Never again candidate SGLT2 Amlodipine  5 held Coreg  12.5 twice daily held, torsemide  100 MT ThF held  Hypothyroidism Continue Synthroid  125  Recent TBI brain managed nonsurgically during hospitalization 12/16-12/9. Do not see overt indication for MRI brain unless felt necessary by ID mentation is good he is oriented and no recent instrumentation or surgical management to brain at last admission  Scrotal wound in the setting of Fournier's gangrene as above Last seen [facility] surgery 10/13/2024-per patient report wound has been closing well with no oozing and wife has been changing the same  BMI 36 Outpatient evaluation  Data Reviewed today: Sodium 124 potassium 4.1 bicarb 21 BUN/creatinine 41/4.5--UA >500 glucose >300 protein moderate leukocytosis negative nitrites rare bacteria   DVT prophylaxis: TED hose   Dispo/Global plan: Inpatient pending resolution-pretty ill but improvement     Subjective:   Looks well taken off oxygen breathing okay no chest pain no fever no nausea vomiting No diarrhea Tells me his hand opened up last week and he was putting gloves on and using Aquaphor to the left hand He is in the process of trying to get a new medication as  per above discussion-I do not see that he is on any directed therapy for his psoriasis  Objective + exam Vitals:   11/11/24 1957 11/11/24 2022 11/12/24 0413 11/12/24 0544  BP: 126/69 120/61 116/66 129/62  Pulse: 65 61 64 64  Resp: 12 19 19 20   Temp: (!) 97.5 F (36.4  C) (!) 97.5 F (36.4 C) 97.6 F (36.4 C) (!) 97.5 F (36.4 C)  TempSrc: Axillary Oral Oral Oral  SpO2: 95% 100% 100% 100%  Weight:    128.6 kg   Filed Weights   11/12/24 0544  Weight: 128.6 kg     Examination: Awake coherent pleasant no distress thick neck Mallampati 4 EOMI NCAT no focal deficit-S1-S2 no murmur no rub no gallop seems to be in sinus/occasional sinus tach with PVC--chest seems clear no wheeze rales rhonchi--abdomen soft no rebound no guarding--TDC well opposed to skin did not examine beneath sterile dressing no oozing recently--lower extremities are warm perfused not significantly edematous maybe 1+ edema?-He does have a callus on his ball of his right foot and a callus on the heel as well but no breakdown--neurologically intact moving 4 limbs equally slightly slow and deliberate speech     Scheduled Meds:  Chlorhexidine  Gluconate Cloth  6 each Topical Daily   insulin  aspart  0-6 Units Subcutaneous TID WC   insulin  aspart  2 Units Subcutaneous TID WC   insulin  glargine  12 Units Subcutaneous Daily   levothyroxine   125 mcg Oral q AM   simvastatin   20 mg Oral QPM   sodium chloride  flush  10-40 mL Intracatheter Q12H   sodium chloride  flush  3 mL Intravenous Q12H   Continuous Infusions:  ceFEPime  (MAXIPIME ) IV     insulin  11.5 Units/hr (11/12/24 9361)   metronidazole  500 mg (11/12/24 0937)   acetaminophen  **OR** acetaminophen , albuterol , dextrose , ondansetron  **OR** ondansetron  (ZOFRAN ) IV  Time 60  Colen Grimes, MD  [facility]

## 2024-11-12 NOTE — Plan of Care (Signed)

## 2024-11-12 NOTE — Progress Notes (Signed)
 [facility] NEPHROLOGY PROGRESS NOTE  Assessment/ Plan: Pt is a 68 y.o. yo male with AKI on HD, HTN, T2DM, recent admit for head contusion s/p fall who is being admitted with fever, leukocytosis, and weakness due to sepsis.  Dialysis Orders:  MTuThF - [facility] 3hr, 400/A1.5, EDW 125kg, 3K/2.5Ca bath, TDC, heparin  5000 unit bolus - Mircera 150mcg IV q 2 weeks (100mcg given 12/5) - Calcitriol  0.5mcg PO q HD   # Sepsis due to MSSA and Proteus bacteremia: Exact source unknown but concern for [facility].   -Plan to remove tunneled HD catheter for line holiday, consult IR. -Consulting ID.  # ESRD [facility]uThF - [facility]: Exam consistent with fluid overload.  I discontinued IV fluid and recommend to minimize IVF.  Plan to do dialysis before taking the HD catheter out so that he can have few days of monitoring.  Discussed with ID, primary team and dialysis nurse.  # Anemia: Hemoglobin 9.6, plan to dose with erythropoietin, no iron  because of bacteremia.Continue to monitor.  # Secondary hyperparathyroidism,: Resume calcitriol  monitor lab.  # HTN/volume: Monitor BP.  Please avoid IV fluid.   Subjective: Seen and examined at bedside.  Patient was receiving IV fluid which was discontinued this morning.  Abdomen mildly distended.  Denies chest pain, shortness of breath.  Plan for HD today. Objective Vital signs in last 24 hours: Vitals:   11/11/24 1957 11/11/24 2022 11/12/24 0413 11/12/24 0544  BP: 126/69 120/61 116/66 129/62  Pulse: 65 61 64 64  Resp: 12 19 19 20   Temp: (!) 97.5 F (36.4 C) (!) 97.5 F (36.4 C) 97.6 F (36.4 C) (!) 97.5 F (36.4 C)  TempSrc: Axillary Oral Oral Oral  SpO2: 95% 100% 100% 100%  Weight:    128.6 kg   Weight change:   Intake/Output Summary (Last 24 hours) at 11/12/2024 1104 Last data filed at 11/12/2024 0300 Gross per 24 hour  Intake 351.04 ml  Output 0 ml  Net 351.04 ml       Labs: RENAL PANEL Recent Labs  Lab 11/06/24 0638 11/11/24 1120  11/12/24 0307 11/12/24 0500  NA 140 128* 126* 124*  K 4.0 4.3 4.2 4.1  CL 102 92* 90* 90*  CO2 25 24 23  21*  GLUCOSE 375* 380* 531* 495*  BUN 29* 29* 41* 41*  CREATININE 3.72* 3.82* 4.49* 4.54*  CALCIUM 8.7* 8.1* 8.3* 8.3*  PHOS  --   --  4.1  --   ALBUMIN   --  2.5* 2.5*  --     Liver Function Tests: Recent Labs  Lab 11/11/24 1120 11/12/24 0307  AST 17  --   ALT 10  --   ALKPHOS 116  --   BILITOT 1.2  --   PROT 6.1*  --   ALBUMIN  2.5* 2.5*   No results for input(s): LIPASE, AMYLASE in the last 168 hours. No results for input(s): AMMONIA in the last 168 hours. CBC: Recent Labs    03/10/24 1213 08/28/24 1613 09/17/24 0322 09/18/24 1541 11/03/24 2002 11/03/24 2017 11/04/24 0223 11/11/24 1120 11/12/24 0306  HGB 12.2*   < > 8.8*   < > 10.8* 11.9* 10.6* 9.7* 9.6*  MCV 83.9   < > 82.4   < > 90.9  --  90.1 84.2 83.0  VITAMINB12  --   --  443  --   --   --   --   --   --   FOLATE  --   --  5.3*  --   --   --   --   --   --  FERRITIN 91  --  40  --   --   --   --   --   --   TIBC 218*  --  190*  --   --   --   --   --   --   IRON  40*  --  39*  --   --   --   --   --   --   RETICCTPCT  --   --  2.3  --   --   --   --   --   --    < > = values in this interval not displayed.    Cardiac Enzymes: No results for input(s): CKTOTAL, CKMB, CKMBINDEX, TROPONINI in the last 168 hours. CBG: Recent Labs  Lab 11/12/24 0626 11/12/24 0707 11/12/24 0742 11/12/24 0917 11/12/24 1027  GLUCAP 428* 469* 386* 319* 241*    Iron  Studies: No results for input(s): IRON , TIBC, TRANSFERRIN, FERRITIN in the last 72 hours. Studies/Results: DG Chest 2 View Result Date: 11/11/2024 CLINICAL DATA:  Chronic fatigue. EXAM: CHEST - 2 VIEW COMPARISON:  November 03, 2024 FINDINGS: There is stable right-sided internal jugular hemodialysis catheter positioning. The cardiac silhouette is mildly enlarged and unchanged in size. Mild atelectatic changes are suspected within the  left lung base. No acute infiltrate, pleural effusion or pneumothorax is identified. The visualized skeletal structures are unremarkable. IMPRESSION: Stable cardiomegaly without acute cardiopulmonary disease. Electronically Signed   By: Suzen Dials M.D.   On: 11/11/2024 10:04    Medications: Infusions:  ceFEPime  (MAXIPIME ) IV     insulin  11.5 Units/hr (11/12/24 9361)   metronidazole  500 mg (11/12/24 9062)    Scheduled Medications:  Chlorhexidine  Gluconate Cloth  6 each Topical Daily   insulin  aspart  0-6 Units Subcutaneous TID WC   insulin  aspart  2 Units Subcutaneous TID WC   insulin  glargine  12 Units Subcutaneous Daily   levothyroxine   125 mcg Oral q AM   simvastatin   20 mg Oral QPM   sodium chloride  flush  10-40 mL Intracatheter Q12H   sodium chloride  flush  3 mL Intravenous Q12H    have reviewed scheduled and prn medications.  Physical Exam: General:NAD, comfortable Heart:RRR, s1s2 nl Lungs:clear b/l, no crackle Abdomen:soft, Non-tender, +distended Extremities: Bilateral lower extremity+ Dialysis Access: TDC, site clean  Dmitriy Gair Prasad Donevin Sainsbury 11/12/2024,11:04 AM  LOS: 1 day

## 2024-11-12 NOTE — Discharge Instructions (Signed)
 [employer] assistance programs Crisis assistance programs  -[facility]elessness Arts development officer. If you are experiencing homelessness in [place], [place] , your first point of contact should be Pensions consultant. You can reach Coordinated Entry by calling (336) [phone] or by emailing [email or web address] [email or web address].  Community access points: [employer] [phone] [address], [place]) every Tuesday from 9am-10am. [facility] ([address] [place]) every Wednesday from 8am-9am.   -[place] Coordinated Re-entry Jayson Michael: Dial 211 and request. Offers referrals to homeless shelters in the area.    -The [employer] 507-604-8943) offers several services to local families, as funding allows. The Emergency Assistance Program (EAP), which they administer, provides household goods, free food, clothing, and financial aid to people in need in the [place]  area. The EAP program does have some qualification, and counselors will interview clients for financial assistance by written referral only. Referrals need to be made by the Department of Social Services or by other EAP approved human services agencies or charities in the area.  -[facility] of [employer], which can be reached at 340-450-2385, offers emergency assistance programs for those in need of help, such as food, rent assistance, a soup kitchen, shelter, and clothing. They are based in [facility] [place]  but provide a number of services to those that qualify for assistance.   [facility] Department of Social Services may be able to offer temporary financial assistance and cash grants for paying rent and utilities, Help may be provided for local county residents who may be experiencing personal crisis when other resources, including government programs, are not available. Call [phone]  -High [employer] is a [employer] agency, The organization can offer emergency assistance for paying rent, Care[employer] Rx, utilities, food, household products and furniture. They offer extensive emergency and transitional housing for families, children and single women, and also run a Boy's and [employer]. Thrift Shops, Secondary school teacher, and other aid offered too. [address], [place], [place]  [address], [phone]  -[place] Low Income Energy Assistance Program -- This is offered for [facility] families. The federal government created [employer] Program provides a one-time cash grant payment to help eligible low-income families pay their electric and heating bills. [address], [place], [place]  [address], [phone]  -[place] Emergency Assistance -- A program offers emergency utility and rent funds for greater [employer] area residents. The program can also provide counseling and referrals to charities and government programs. Also provides food and a free meal program that serves lunch Mondays - Saturdays and dinner seven days per week to individuals in the community. [address] [place], [place]  [address], [phone]  -[employer] - Offers affordable apartment and housing communities across      [place] and [place]. The low income and seniors can access public housing, rental assistance to qualified applicants, and apply for the section 8 rent subsidy program. Other programs include Chiropractor and Engineer, maintenance. [address], [place],   [address], dial [phone].  -[facility] provides transitional housing to veterans and the disabled. Clients will also access other services too, including assistance in applying for Disability, life skills classes, case management, and assistance in finding permanent housing. [address], [place], [place]  [place] [address], call [phone]  -[facility] [place] Transitional Housing through [facility] is for people who were just  evicted or that are formerly homeless. The non-profit will also help then gain self-sufficiency, find a home or apartment to live in, and also provides information on rent assistance when needed. Phone [phone]  -The [place] [facility] [facility] helps low income, elderly, or disabled residents in seven counties in the [place] ([place], [place], [place], [place], [place], [place], [place], and [place]) save energy and reduce their utility bills by improving energy efficiency. Phone [phone].  -[employer] is located in the [place] [facility] in the [employer], [address], [address], [place], [place] [address]. Parking is in the rear of the building. Phone: [phone]   General Email: info@gsohc .org  GHC provides free housing counseling assistance in locating affordable rental housing or housing with support services for families and individuals in crisis and the chronically homeless. We provide potential resources for other housing needs like utilities. Our trained counselors also work with clients on budgeting and financial literacy in effort to empower them to take control of their financial situations. [employer] collaborates with homeless service providers and other stakeholders as part of the [employer] COC (Continuum of Care). The (COC) is a regional/local planning body that coordinates housing and services funding for homeless families and individuals. The role of [facility] in the COC is through housing counseling to work with people we serve on diversion strategies for those that are at imminent risk of becoming homeless. We also work with the Coordinated Assessment/Entry Specialist who attempts to find temporary solutions and/or connects the people  to Housing First, Rapid Re-housing or transitional housing programs. Our Homelessness Prevention Housing Counselors meet with clients on business days (Monday-Fridays, except scheduled holidays) from 8:30 am to 4:30 pm.  Legal assistance for evictions, foreclosure, and more -If you need free legal advice on civil issues, such as foreclosures, evictions, Electronics engineer, government programs, domestic issues and more, Landscape architect of [place]  [facility]) is a Associate Professor firm that provides free legal services and counsel to lower income people, seniors, disabled, and others, The goal is to ensure everyone has access to justice and fair representation. Call them at [phone].  [facility] for Housing and Community Studies can provide info about obtaining legal assistance with evictions. Phone [phone].  Data processing manager  The [employer], [employer]. offers job and Dispensing optician. Resources are focused on helping students obtain the skills and experiences that are necessary to compete in today's challenging and tight job market. The non-profit faith-based community action agency offers internship trainings as well as classroom instruction. Classes are tailored to meet the needs of people in the [facility] region. [place], [place] [address], [phone]  Foreclosure prevention/Debt Services Family Services of the [employer] Credit Counseling Service inludes debt and foreclosure prevention programs for local families. This includes money management, financial advice, budget review and development of a written action plan with a Pensions consultant to help solve specific individual financial problems. In addition, housing and mortgage counselors can also provide pre- and post-purchase homeownership counseling, default resolution counseling (to prevent foreclosure) and reverse mortgage counseling. A Debt Management Program allows  people and families with a high level of credit card or medical debt to consolidate and repay consumer debt and loans to creditors and rebuild positive credit ratings and scores. Contact (336) D7650557.  Community clinics in [place] -Health Department [facility]: [address] [place], [place], [address]. [phone].  -Health Department [facility]: [phone]  [address], [facility], [address]. [phone].  -[facility] offers medical care through a group of doctors, pharmacies and other healthcare related agencies that offer services for low income, uninsured adults in [place]. Also offers adult Dental care and assistance with applying for an [employer]. Call [phone].   [facility]. This center provides low-cost health care to those without health insurance. Services offered include an onsite pharmacy. Phone [phone]. [address], [address], [place].  -Medication Assistance Program serves as a link between pharmaceutical companies and patients to provide low cost or free prescription medications. This service is available for residents who meet certain income restrictions and have no insurance coverage. PLEASE CALL [phone] Jonette Nestle) OR [phone] ([place])  -One Step Further: Materials engineer, The [employer] Support & Nutrition Program, [employer]. Call [phone]/ [phone].  Food pantry and assistance -Urban Ministry-Food Bank: [address], [place] [address]. Phone [phone]  -[facility] Food Pantry: [address], [place], [place] [address]. [phone].  -Missionary Ministry: has the purpose of visiting the sick and shut-ins and provide for needs in the surrounding communities. Call ([phone]. Email: [email or web address] This program provides: Food box for seniors, Financial assistance, Food to meet basic  nutritional needs.  -Meals on Wheels with Senior Resources: [facility] residents age 37 and over who are homebound and unable to obtain and prepare a nutritious meal for themselves are eligible for this service. There may be a waiting list in certain parts of [facility] if the route in that area is full. If you are in [facility] and [place] call [phone] to register. For all other areas call [phone] to register.  -Greater Dietitian: https://findfood.BargainContractor.si  TRANSPORTATION: -[employer] Department of Health: Call [facility] and [employer] at [phone] for details. AttractionGuides.es  -Access [place]: Access [place] is the [employer] Agency's shared-ride transportation service for eligible riders who have a disability that prevents them from riding the fixed route bus. Call [phone]. Access [place] riders must pay a fare of $1.50 per trip, or may purchase a 10-ride punch card for $14.00 ($1.40 per ride) or a 40-ride punch card for $48.00 ($1.20 per ride).  -The Shepherd's WHEELS rideshare transportation service is provided for senior citizens (60+) who live independently within [place] city limits and are unable to drive or have limited access to transportation. Call [phone] to schedule an appointment.  -[place] Transportation: For Medicare or Medicaid recipients call [phone]?[facility], wheelchair Carloyn Chi, and ambulatory quotes available.   FLEEING VIOLENCE: -Family Services of the [place]- 24/7 Crisis line [phone]) -[facility] Justice Centers: (336) [phone])  [place] 2-1-1 is another useful way to locate resources in the community. Visit ShedSizes.ch to find service information online. If you need additional assistance, 2-1-1 Referral Specialists are available 24 hours a day, every day by dialing  2-1-1 or [phone] from any phone. The call is free, confidential, and available in any language.  Affordable Housing Search http://www.nchousingsearch.[facility])   M-F 8a-3p [address] [place]  [place], [place] [address] [phone] Services include: laundry, barbering, support groups, case management, phone & computer access, showers, AA/NA mtgs, mental health/substance abuse nurse, job skills class, disability information, VA assistance, spiritual classes, etc. Winter Shelter available when temperatures are less than 32 degrees.   HOMELESS SHELTERS [facility] Night [facility] at [facility]- Call [phone] ext. [phone]  or ext. [phone]. Located at [address], [place], [place] [address]  [facility] Mens Shelter- Call [phone]. Located at [address] [place] [address].  [facility] [employer]. Call [phone]. Office located at [address], [place] [address].  Pathways Family Housing through [place] [phone].  [facility] Family Shelter- Call [phone]. Located at [address] [place], [place], [place] [address].  Room at the Inn-For Pregnant mothers. Call [phone]. Located at [address] [place], [address].  [facility]-For men in [place]. Call [phone]. [facility]-Shelter in [place]. Call [phone].  Home of [employer] for [employer] [phone]. Office located at [address], [place], [address].  [employer] be agreeable to help with chores. Call [phone] ext. [phone].  Men's: [address] [place], [place] [address]. Women's: [facility]  [address] [place], [place] [address]  Crisis Services Therapeutic Alternatives Mobile Crisis Management- [phone]  [facility] [address], [place], [place] [address]. Phone: [phone] Rent/Utility Assistance in  [facility]:  INNOVATIVE PATHWAYS [address] [place], [place] [address] [phone] Mon 8:00am - 6:00pm; Tue 8:00am - 6:00pm; Wed 8:00am - 6:00pm; Thu 8:00am - 6:00pm; Fri 8:00am - 6:00pm; Email: [email or web address] Eligibility: Residents of [place], [place], [place], [place], [place] and [place] that meet income limits. Call or text for eligibility screening.   [facility] MINISTRY [address] [place], [place], [place] [address] [phone] (Main: Rental Assistance) [phone] (Main: Utility Assistance) Mon 8:30am - 5:00pm; Tue 8:30am - 5:00pm; Wed 8:30am - 5:00pm; Thu 8:30am - 5:00pm; Fri 8:30am - 5:00pm; Website: http://www.greensborourbanministry.org/emergency-assistance-program Eligibility: People who have an unexpected crisis or emergency that can be verified. Must have some form of income and meet income limits. At the first of the month, only helps with rent/mortgage assistance for those who have court ordered eviction notices. Call for application information. Call for exact documents that will be needed. Examples of documents that may be needed: Photo ID, Social Security cards for everyone in the household, and proof of income for previous 2 months. Copy of eviction notice for rent assistance and copy of final notice for utility assistance. Statements or receipts of bills for previous 2 months.   [facility] - [place] [address], [place], [place] [address] [phone] (Main) [phone] (Alternate) Mon 9:00am - 5:00pm; Tue 9:00am - 5:00pm; Wed 9:00am - 5:00pm; Thu 9:00am - 5:00pm; Fri 9:00am - 5:00pm; Website: http://[email or web address].salvationarmy.org/[place]/emergency-financial-assistance Email: [email or web address] Eligibility: People experiencing a housing crisis with past-due rent and/or utilities and meet income limits. Must be willing to take part in 6 Call or visit website to download  application. Return complete application by mail or email only. Documents: Help with Utilities: Photo ID, proof of household income, copies of monthly bills or receipts, and a final disconnection/shut-off notice. Help with Rent or Mortgage: Photo ID, proof of income, copies of monthly bills or receipts, and eviction notice. Help with Household Goods: Photo ID, proof of household income, copies of monthly bills or receipts, and a fire or flood report.  [facility] [place] [address], [place], [place] [address] [phone] (Main) Mon 8:00am - 5:00pm; Tue 8:00am - 5:00pm; Wed 8:00am - 5:00pm; Thu 8:00am - 5:00pm; Fri 8:00am - 12:00pm; Website: http://southernusa.salvationarmy.org/[place]/emergency-financial-assistance Email: [email or web address] Call for eligibility information. Apply :Utilities Assistance: Visit office by 8:30am on 1st and 4th Monday of each  month to pick up application. Rent and Mortgage Assistance: Visit office by 8:30am on 2nd and 3rd Monday of each month to pick up application. NOTE: If Monday falls on a holiday applications can be picked up the following Tuesday. Documents required will be listed on application.  [facility] [place] [phone] (Main) Seen by appointment only. Call for more information. Eligibility: Meet income limits. Apply: Call for information on how to schedule an appointment. Each month there is a specific day to call to schedule an appointment. It is stated on the agency voicemail message. Appointments fill up quickly each month. Documents: Photo ID, copy of current utility bill.  [facility] [place] [address] [place], [place] [address] [phone] (Main) Tue 9:00am - 4:00pm; Wed 9:00am - 4:00pm; Thu 9:00am - 4:00pm; Website: http://www.[email or web address] Email: [email or web address]@gmail .com Eligibility: Utility Assistance: Meet income limits and be a Haematologist. [employer] customers do not qualify. Must not have received utility assistance for another agency within the last 90 days. Rent Assistance: Residents of [place] who meet income limits. Must not have received rent assistance for another agency within the last 90 days. Apply: Call to schedule an appointment. Documents: Utility Assistance: Photo ID, City of [employer], copy of lease (if not paying a mortgage), proof of income, and monthly expenses. Rent Assistance: Photo ID, W-9 from the landlord, copy of the lease, proof of income, and a list of monthly expenses.  [facility] [place] [address] [place], [place] [address] [phone] (Main: Help With Rent) [phone] (Main: Help With Utilities) Mon 9:00am - 4:00pm; Tue 9:00am - 4:00pm; Wed 9:00am - 4:00pm; Thu 9:00am - 4:00pm; Fri 9:00am - 4:00pm; Website: MotivationalSites.no Email: [email or web address] Eligibility: People experiencing a financial crisis. Apply: Call to schedule an appointment Wednesday, 7:30am. Documents: Photo ID, Social Security card, proof of income, and proof of address. Other documents may be required, depending on service. Call for more information.  LOW INCOME ENERGY ASSISTANCE PROGRAM DEPARTMENT OF SOCIAL SERVICES - [facility] [address], [place], [place] [address] [phone] (Main) Mon 8:00am - 5:00pm; Tue 8:00am - 5:00pm; Wed 8:00am - 5:00pm; Thu 8:00am - 5:00pm; Fri 8:00am - 5:00pm; Website: [email or web address] Eligibility: Meet income limits and resource guidelines. Each household is only eligible once, even if multiple members apply. Apply: Call to see if funds are available. Visit to complete an application, call to have 1 mailed, or apply online at [email or web address] NOTE: Households with a person age 47  and over or a person with a documented disability can apply beginning December 1. Other households can apply beginning January 1. Documents: Photo ID, birth certificate, proof of household income, copy of utility bill, latest bank statement, the names and Social Security numbers for everyone in the household, and proof of disability if under age 103.  LOW INCOME ENERGY ASSISTANCE PROGRAM DEPARTMENT OF SOCIAL SERVICES - [facility] [address] [place], [place], [place] [address] [phone] (Main) Mon 8:00am - 5:00pm; Tue 8:00am - 5:00pm; Wed 8:00am - 5:00pm; Thu 8:00am - 5:00pm; Fri 8:00am - 5:00pm; Website: [email or web address] Eligibility: Meet income limits and resource guidelines. Each household is only eligible once, even if multiple members apply. Apply: Call to see if funds are available. Visit to complete an application, call to have 1 mailed, or apply online at [email or web address] NOTE: Households with a person age 47 and over or a person with a documented  disability can apply beginning December 1. Other households can apply beginning January 1. Documents: Photo ID, birth certificate, proof of household income, copy of utility bill, latest bank statement, the names and Social Security numbers for everyone in the household, and proof of disability if under age 11.

## 2024-11-13 ENCOUNTER — Inpatient Hospital Stay (HOSPITAL_COMMUNITY)

## 2024-11-13 DIAGNOSIS — N186 End stage renal disease: Secondary | ICD-10-CM | POA: Diagnosis not present

## 2024-11-13 DIAGNOSIS — L408 Other psoriasis: Secondary | ICD-10-CM | POA: Diagnosis not present

## 2024-11-13 DIAGNOSIS — B9561 Methicillin susceptible Staphylococcus aureus infection as the cause of diseases classified elsewhere: Secondary | ICD-10-CM | POA: Diagnosis not present

## 2024-11-13 DIAGNOSIS — Z992 Dependence on renal dialysis: Secondary | ICD-10-CM | POA: Diagnosis not present

## 2024-11-13 DIAGNOSIS — E1122 Type 2 diabetes mellitus with diabetic chronic kidney disease: Secondary | ICD-10-CM | POA: Diagnosis not present

## 2024-11-13 DIAGNOSIS — B964 Proteus (mirabilis) (morganii) as the cause of diseases classified elsewhere: Secondary | ICD-10-CM | POA: Diagnosis not present

## 2024-11-13 DIAGNOSIS — A419 Sepsis, unspecified organism: Secondary | ICD-10-CM | POA: Diagnosis not present

## 2024-11-13 DIAGNOSIS — Z794 Long term (current) use of insulin: Secondary | ICD-10-CM | POA: Diagnosis not present

## 2024-11-13 HISTORY — PX: IR REMOVAL TUN CV CATH W/O FL: IMG2289

## 2024-11-13 LAB — CBC WITH DIFFERENTIAL/PLATELET
Abs Immature Granulocytes: 0.35 K/uL — ABNORMAL HIGH (ref 0.00–0.07)
Basophils Absolute: 0.1 K/uL (ref 0.0–0.1)
Basophils Relative: 0 %
Eosinophils Absolute: 0.2 K/uL (ref 0.0–0.5)
Eosinophils Relative: 1 %
HCT: 30.2 % — ABNORMAL LOW (ref 39.0–52.0)
Hemoglobin: 10.5 g/dL — ABNORMAL LOW (ref 13.0–17.0)
Immature Granulocytes: 2 %
Lymphocytes Relative: 10 %
Lymphs Abs: 1.9 K/uL (ref 0.7–4.0)
MCH: 28.5 pg (ref 26.0–34.0)
MCHC: 34.8 g/dL (ref 30.0–36.0)
MCV: 81.8 fL (ref 80.0–100.0)
Monocytes Absolute: 2.1 K/uL — ABNORMAL HIGH (ref 0.1–1.0)
Monocytes Relative: 11 %
Neutro Abs: 14.4 K/uL — ABNORMAL HIGH (ref 1.7–7.7)
Neutrophils Relative %: 76 %
Platelets: 56 K/uL — ABNORMAL LOW (ref 150–400)
RBC: 3.69 MIL/uL — ABNORMAL LOW (ref 4.22–5.81)
RDW: 16.6 % — ABNORMAL HIGH (ref 11.5–15.5)
WBC: 19 K/uL — ABNORMAL HIGH (ref 4.0–10.5)
nRBC: 0 % (ref 0.0–0.2)

## 2024-11-13 LAB — RENAL FUNCTION PANEL
Albumin: 2.3 g/dL — ABNORMAL LOW (ref 3.5–5.0)
Anion gap: 14 (ref 5–15)
BUN: 29 mg/dL — ABNORMAL HIGH (ref 8–23)
CO2: 22 mmol/L (ref 22–32)
Calcium: 8.2 mg/dL — ABNORMAL LOW (ref 8.9–10.3)
Chloride: 94 mmol/L — ABNORMAL LOW (ref 98–111)
Creatinine, Ser: 3.18 mg/dL — ABNORMAL HIGH (ref 0.61–1.24)
GFR, Estimated: 20 mL/min — ABNORMAL LOW
Glucose, Bld: 218 mg/dL — ABNORMAL HIGH (ref 70–99)
Phosphorus: 2.4 mg/dL — ABNORMAL LOW (ref 2.5–4.6)
Potassium: 3.8 mmol/L (ref 3.5–5.1)
Sodium: 129 mmol/L — ABNORMAL LOW (ref 135–145)

## 2024-11-13 LAB — GLUCOSE, CAPILLARY
Glucose-Capillary: 221 mg/dL — ABNORMAL HIGH (ref 70–99)
Glucose-Capillary: 236 mg/dL — ABNORMAL HIGH (ref 70–99)
Glucose-Capillary: 348 mg/dL — ABNORMAL HIGH (ref 70–99)
Glucose-Capillary: 310 mg/dL — ABNORMAL HIGH (ref 70–99)

## 2024-11-13 MED ORDER — CALCITRIOL 0.25 MCG PO CAPS
0.2500 ug | ORAL_CAPSULE | Freq: Every day | ORAL | Status: AC
  Administered 2024-11-13 – 2024-11-22 (×8): 0.25 ug via ORAL
  Filled 2024-11-13 (×4): qty 1

## 2024-11-13 MED ORDER — LIDOCAINE HCL 1 % IJ SOLN
20.0000 mL | Freq: Once | INTRAMUSCULAR | Status: AC
  Administered 2024-11-13: 20 mL via INTRADERMAL

## 2024-11-13 MED ORDER — HEPARIN SODIUM (PORCINE) 1000 UNIT/ML IJ SOLN
4000.0000 [IU] | Freq: Once | INTRAMUSCULAR | Status: AC
  Administered 2024-11-13: 4000 [IU]

## 2024-11-13 MED ORDER — INSULIN GLARGINE 100 UNIT/ML ~~LOC~~ SOLN
9.0000 [IU] | Freq: Every day | SUBCUTANEOUS | Status: DC
  Administered 2024-11-14 – 2024-11-16 (×3): 9 [IU] via SUBCUTANEOUS
  Filled 2024-11-13 (×3): qty 0.09

## 2024-11-13 MED ORDER — HEPARIN SODIUM (PORCINE) 1000 UNIT/ML IJ SOLN
INTRAMUSCULAR | Status: AC
  Filled 2024-11-13: qty 4

## 2024-11-13 MED ORDER — INSULIN GLARGINE 100 UNIT/ML ~~LOC~~ SOLN: 18.0000 [IU] | Freq: Every day | SUBCUTANEOUS | Status: DC

## 2024-11-13 MED ORDER — LIDOCAINE HCL 1 % IJ SOLN
INTRAMUSCULAR | Status: AC
  Filled 2024-11-13: qty 20

## 2024-11-13 NOTE — Progress Notes (Signed)
" °   11/13/24 0234  Vitals  BP 91/62  MAP (mmHg) 71  ECG Heart Rate 92  Resp 11  During Treatment Monitoring  Blood Flow Rate (mL/min) 350 mL/min  Arterial Pressure (mmHg) -223.83 mmHg  Venous Pressure (mmHg) 169.48 mmHg  TMP (mmHg) 4.64 mmHg  Ultrafiltration Rate (mL/min) 1180 mL/min  Dialysate Flow Rate (mL/min) 300 ml/min  Dialysate Potassium Concentration 3  Dialysate Calcium Concentration 2.5  Duration of HD Treatment -hour(s) 3.45 hour(s)  Cumulative Fluid Removed (mL) per Treatment  2846.03  Intra-Hemodialysis Comments Tx completed  Post Treatment  Dialyzer Clearance Lightly streaked  Liters Processed 74.8  Fluid Removed (mL) 2900 mL  Tolerated HD Treatment Yes    "

## 2024-11-13 NOTE — Progress Notes (Addendum)
 [facility]  [name] FMW:[id number]  DOB: 1956/05/08  DOA: 11/11/2024  PCP: Rexanne Ingle, MD  11/13/2024,12:38 PM  LOS: 2 days    Code Status: Full code     from: Home   68 year old history of Fournier's gangrene 08/2024 status post I&D drainage wound 08/30/2024 Dr. Salley was felt secondary to use of Farxiga -cultures grew strep constellatus MSSA Prevotella + beta-lactamase positive and patient was placed on Augmentin  at time of discharge EOT 09/13/2024-discharge weight 301 10/28 readmitted SOB-AKI worsening 18 pound weight gain-cardiology nephrology consulted 2/2 NYHA IV symptoms--started on IHD status post tunneled cath 09/22/2024 12/16 readmission obtunded on the floor WBC 17,000 CT contusion with TBI?  Meningioma-Rx Keppra  and was not discharged on the same as neurology did not feel this was a necessity at that time--- was discharged 12/19  Chr med h/o--OSA HTN IDDM hypothyroid HFpEF gout  12/24 came back to the emergency room feeling weak blood pressure low has been on 4 times a week HD had a fever-CBG noted to be 380 lactic acid 1.4 Tmax 101 WBC 21.6 blood pressure 90/40 nephrology consulted-crystalloid bolus fluids given Vanco Flagyl  cefepime  added 12/25 early a.m. seem to be in DKA transferred to progressive unit 2/2 concern for DKA CT head cystic hygromas 4 mm right 3 mm left trace leftward midline shift regressed not resolved inferior frontal gyrus hemorrhagic contusion--37 mm stable right meningioma 12/26 underwent HD and HD cath removed by IR   Assessment  & Plan :    Sepsis MSSA/Prevotella source unclear DDx groin DDx left hand versus indwelling right HD cath [facility] Hypotensive on admission Defer to ID-may need TTE/TEE continue cefepime  Flagyl  vancomycin  broad coverage, WBC elevated still Examined perineal area-looks clean mild ooze see picture below  Raises the possibility that this is most likely not the source and that the portal of entry  MSSA was from hands/denuded  skin  Hyperglycemia ? DKA??  On 12/25 CBGs much better controlled-continues on long-acting-+TEE in am so cut back dose to 9U and observe Resume Mounjaro  15 q. Wednesday at discharge  Immunosuppression secondary to psoriasis follows with Dr. Dianah (dermatology) for the past 6 years Has not been able to obtain Bimzelx yet hold clobetasol  at this time  Newly declared ESRD-dialyzing via right tunneled cath placed 09/22/2024 Dialyzed 12/26 HD cath removed giving line holiday Defer to ID/nephro went to place   HFpEF NYHA IV Never again candidate SGLT2 Amlodipine  5 held Coreg  12.5 twice daily held, torsemide  100 MT ThF held  Hypothyroidism Continue Synthroid  125  Recent TBI brain managed nonsurgically during hospitalization 12/16-12/9. 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 Victory Boers neurosurgery --absence of mental status changes focal deficit --nothing surgically specific to do in this case  Scrotal wound in the setting of Fournier's gangrene as above Last seen [facility] surgery 10/13/2024-wound reviewed today with nursing 12/26 looks good see picture above  BMI 36 Outpatient evaluation  Data Reviewed today: Sodium 129 potassium 3.8 BUN/creatinine 29/3.1 WBC 19 hemoglobin 10.5 platelet 56   DVT prophylaxis: TED hose   Dispo/Global plan: Inpatient pending resolution-pretty ill but improvement     Subjective:   Fair--no Ha/CP Fever  Just back from IR  Objective + exam Vitals:   11/13/24 0234 11/13/24 0245 11/13/24 0330 11/13/24 0810  BP: 91/62 105/76 (!) 108/59 105/64  Pulse:   90   Resp: 11 13 11 14   Temp:  98 F (36.7 C) 98 F (36.7 C)  TempSrc:   Oral Axillary  SpO2:   97%   Weight:       Filed Weights   11/12/24 0544  Weight: 128.6 kg     Examination: Awake coherent pleasant no distress thick neck Mallampati 4 EOMI NCAT no focal  deficit-S1-S2 no murmur no rub no gallop --cta b no wheeze rales---abd soft nt nd no rebound---Neuro intact no focal defcicits--moving 4 limbs ==lly, no flap Skin exam as above---mild LE edema  Scheduled Meds:  calcitRIOL   0.25 mcg Oral Daily   Chlorhexidine  Gluconate Cloth  6 each Topical Daily   Chlorhexidine  Gluconate Cloth  6 each Topical Q0600   insulin  aspart  0-6 Units Subcutaneous TID WC   insulin  aspart  2 Units Subcutaneous TID WC   [START ON 11/14/2024] insulin  glargine  18 Units Subcutaneous Daily   levothyroxine   125 mcg Oral q AM   simvastatin   20 mg Oral QPM   sodium chloride  flush  10-40 mL Intracatheter Q12H   sodium chloride  flush  3 mL Intravenous Q12H   Continuous Infusions:  ceFEPime  (MAXIPIME ) IV 1 g (11/13/24 0944)   insulin  Stopped (11/12/24 1224)   acetaminophen  **OR** acetaminophen , albuterol , dextrose , ondansetron  **OR** ondansetron  (ZOFRAN ) IV, sodium chloride  flush  Time 60  Colen Grimes, MD  [facility]

## 2024-11-13 NOTE — Progress Notes (Signed)
" ° °  [facility] has been requested to perform a transesophageal echocardiogram on Brendan [name]. for bacteremia.  After careful review of history and examination, the risks and benefits of transesophageal echocardiogram have been explained including risks of esophageal damage, perforation (1:10,000 risk), bleeding, pharyngeal hematoma as well as other potential complications associated with conscious sedation including aspiration, arrhythmia, respiratory failure and death. Alternatives to treatment were discussed, questions were answered. Patient is willing to proceed.   Tentatively plan for 12/29.  Of note patient is on SSI here secondary to diabetes, please make adjustments as needed in anticipation for n.p.o. the preceding night.  Thom LITTIE Sluder, PA-C  11/13/2024 1:46 PM   "

## 2024-11-13 NOTE — Progress Notes (Addendum)
 "        [facility]  Date of Admission:  11/11/2024   Total days of inpatient antibiotics 2  Principal Problem:   Sepsis (HCC) Active Problems:   Insulin -requiring or dependent type II diabetes mellitus (HCC)   OSA (obstructive sleep apnea)   Hypothyroidism   ESRD on dialysis (HCC)   Hyperlipidemia associated with type 2 diabetes mellitus (HCC)   Anemia of chronic disease   Hypotension   History of traumatic brain injury   Heart failure with preserved ejection fraction [facility])   Open wound          Assessment: 68 year old male with ESRD on HD, hypertension hyperlipidemia, diabetes, hypothyroidism, nec fasciitis of scrotum status post I&D discharged on Augmentin  through 10/26, recent admission 12/16 - 12/19  for concern of traumatic brain injury MRI showing subarachnoid hemorrhage, trace intraventricular hemorrhage, meningioma neurosurgery was consulted recommend antiseizure medicine found to have #MSSA and Proteus mirabilis bacteremia 2/2 hd line  vs less likely b/l palms - Patient presented with lack of energy, low blood pressure.  Found to have a temp of 101, WBC 21K.  Chest x-ray without acute abnormality.  Admitted with sepsis secondary to unclear etiology - Blood cultures from admission grew MSSA and Proteus.  ID out of consulted.  -TTE no vegetation  #ESRD on HD - via HD catheter  -Nephrology following  #Psoriasis b/l palms -does not look actively infected   Recommendations:  -Continue cefepime  -Repeat blood Cx today -TEE -IR consulted  for HD cath removal, needs 72h line holiday -Standard precautions -Dr. Overton covering this weekend  Microbiology:   Antibiotics: Cefepime  12/24- Metronidazole  12/24- Vancomycin  12/24 Cultures: Blood 12/24 1/2 MSSA, proteus sp Urine  Other   SUBJECTIVE: Resting in bed. Family at bedside Interval: Afebrile overnight.  Review of Systems: Review of Systems  All other systems reviewed and are  negative.    Scheduled Meds:  calcitRIOL   0.25 mcg Oral Daily   Chlorhexidine  Gluconate Cloth  6 each Topical Daily   Chlorhexidine  Gluconate Cloth  6 each Topical Q0600   insulin  aspart  0-6 Units Subcutaneous TID WC   insulin  aspart  2 Units Subcutaneous TID WC   [START ON 11/14/2024] insulin  glargine  9 Units Subcutaneous Daily   levothyroxine   125 mcg Oral q AM   simvastatin   20 mg Oral QPM   sodium chloride  flush  10-40 mL Intracatheter Q12H   sodium chloride  flush  3 mL Intravenous Q12H   Continuous Infusions:  ceFEPime  (MAXIPIME ) IV 1 g (11/13/24 0944)   PRN Meds:.acetaminophen  **OR** acetaminophen , albuterol , dextrose , ondansetron  **OR** ondansetron  (ZOFRAN ) IV, sodium chloride  flush Allergies[1]  OBJECTIVE: Vitals:   11/13/24 0811 11/13/24 1312 11/13/24 1705 11/13/24 1941  BP:  (!) 146/71 (!) 171/93 (!) 105/58  Pulse:  85 95 89  Resp:  16 20 20   Temp: 98 F (36.7 C) 97.7 F (36.5 C) 98.5 F (36.9 C) 98.9 F (37.2 C)  TempSrc:  Oral Oral Oral  SpO2:    98%  Weight:       Body mass index is 36.4 kg/m.  Physical Exam Constitutional:      General: He is not in acute distress.    Appearance: He is normal weight. He is not toxic-appearing.  HENT:     Head: Normocephalic and atraumatic.     Right Ear: External ear normal.     Left Ear: External ear normal.     Nose: No congestion or rhinorrhea.  Mouth/Throat:     Mouth: Mucous membranes are moist.     Pharynx: Oropharynx is clear.  Eyes:     Extraocular Movements: Extraocular movements intact.     Conjunctiva/sclera: Conjunctivae normal.     Pupils: Pupils are equal, round, and reactive to light.  Cardiovascular:     Rate and Rhythm: Normal rate and regular rhythm.     Heart sounds: No murmur heard.    No friction rub. No gallop.     Comments: Right chest HD line Pulmonary:     Effort: Pulmonary effort is normal.     Breath sounds: Normal breath sounds.  Abdominal:     General: Abdomen is flat.  Bowel sounds are normal.     Palpations: Abdomen is soft.  Musculoskeletal:     Cervical back: Normal range of motion and neck supple.  Skin:    General: Skin is warm and dry.  Neurological:     General: No focal deficit present.     Mental Status: He is oriented to person, place, and time.  Psychiatric:        Mood and Affect: Mood normal.    Psoriasis on palms   Lab Results Lab Results  Component Value Date   WBC 19.0 (H) 11/13/2024   HGB 10.5 (L) 11/13/2024   HCT 30.2 (L) 11/13/2024   MCV 81.8 11/13/2024   PLT 56 (L) 11/13/2024    Lab Results  Component Value Date   CREATININE 3.18 (H) 11/13/2024   BUN 29 (H) 11/13/2024   NA 129 (L) 11/13/2024   K 3.8 11/13/2024   CL 94 (L) 11/13/2024   CO2 22 11/13/2024    Lab Results  Component Value Date   ALT 10 11/11/2024   AST 17 11/11/2024   ALKPHOS 116 11/11/2024   BILITOT 1.2 11/11/2024        Loney Stank, MD [facility] 11/13/2024, 8:06 PM     [1]  Allergies Allergen Reactions   Ace Inhibitors Cough   Maxidex  [Dexamethasone ] Other (See Comments)    Sexual dysfunction   Verapamil Other (See Comments)    constipation   "

## 2024-11-13 NOTE — Plan of Care (Signed)

## 2024-11-13 NOTE — Progress Notes (Signed)
 [name] NEPHROLOGY PROGRESS NOTE  Assessment/ Plan: Pt is a 68 y.o. yo male with AKI on HD, HTN, T2DM, recent admit for head contusion s/p fall who is being admitted with fever, leukocytosis, and weakness due to sepsis.  Dialysis Orders:  MTuThF - [facility] 3hr, 400/A1.5, EDW 125kg, 3K/2.5Ca bath, TDC, heparin  5000 unit bolus - Mircera 150mcg IV q 2 weeks (100mcg given 12/5) - Calcitriol  0.5mcg PO q HD   # Sepsis due to MSSA and Proteus bacteremia: Exact source unknown but concern for TDC.   -IR consulted to discontinue TDC. -Currently on cefepime , ID is following.  # ESRD MTuThF - [facility]: He completed dialysis early this morning with around 2.9 L ultrafiltration.  Removing TDC today for line holiday.  Hopefully he can wait until Monday for next HD.  Please minimize fluid intake.  # Anemia: Hemoglobin 10.5, dose Aranesp for maintenance.  # Secondary hyperparathyroidism: Phosphorus low, not on binders.  Resume calcitriol .  # HTN/volume: Monitor BP.  Please avoid IV fluid.  Subjective: Seen and examined at bedside.  Denies nausea, vomiting, chest pain or shortness of breath.  Completed dialysis early morning. Objective Vital signs in last 24 hours: Vitals:   11/13/24 0234 11/13/24 0245 11/13/24 0330 11/13/24 0810  BP: 91/62 105/76 (!) 108/59 105/64  Pulse:   90   Resp: 11 13 11 14   Temp:   98 F (36.7 C) 98 F (36.7 C)  TempSrc:   Oral Axillary  SpO2:   97%   Weight:       Weight change:   Intake/Output Summary (Last 24 hours) at 11/13/2024 1056 Last data filed at 11/13/2024 0234 Gross per 24 hour  Intake --  Output 2900 ml  Net -2900 ml       Labs: RENAL PANEL Recent Labs  Lab 11/11/24 1120 11/12/24 0307 11/12/24 0500 11/13/24 0426  NA 128* 126* 124* 129*  K 4.3 4.2 4.1 3.8  CL 92* 90* 90* 94*  CO2 24 23 21* 22  GLUCOSE 380* 531* 495* 218*  BUN 29* 41* 41* 29*  CREATININE 3.82* 4.49* 4.54* 3.18*  CALCIUM 8.1* 8.3* 8.3* 8.2*  PHOS   --  4.1  --  2.4*  ALBUMIN  2.5* 2.5*  --  2.3*    Liver Function Tests: Recent Labs  Lab 11/11/24 1120 11/12/24 0307 11/13/24 0426  AST 17  --   --   ALT 10  --   --   ALKPHOS 116  --   --   BILITOT 1.2  --   --   PROT 6.1*  --   --   ALBUMIN  2.5* 2.5* 2.3*   No results for input(s): LIPASE, AMYLASE in the last 168 hours. No results for input(s): AMMONIA in the last 168 hours. CBC: Recent Labs    03/10/24 1213 08/28/24 1613 09/17/24 0322 09/18/24 1541 11/03/24 2017 11/04/24 0223 11/11/24 1120 11/12/24 0306 11/13/24 0729  HGB 12.2*   < > 8.8*   < > 11.9* 10.6* 9.7* 9.6* 10.5*  MCV 83.9   < > 82.4   < >  --  90.1 84.2 83.0 81.8  VITAMINB12  --   --  443  --   --   --   --   --   --   FOLATE  --   --  5.3*  --   --   --   --   --   --   FERRITIN 91  --  40  --   --   --   --   --   --  TIBC 218*  --  190*  --   --   --   --   --   --   IRON  40*  --  39*  --   --   --   --   --   --   RETICCTPCT  --   --  2.3  --   --   --   --   --   --    < > = values in this interval not displayed.    Cardiac Enzymes: No results for input(s): CKTOTAL, CKMB, CKMBINDEX, TROPONINI in the last 168 hours. CBG: Recent Labs  Lab 11/12/24 1123 11/12/24 1226 11/12/24 1628 11/12/24 2107 11/13/24 0623  GLUCAP 184* 153* 296* 257* 221*    Iron  Studies: No results for input(s): IRON , TIBC, TRANSFERRIN, FERRITIN in the last 72 hours. Studies/Results: CT HEAD WO CONTRAST ( ) Result Date: 11/13/2024 EXAM: CT HEAD WITHOUT CONTRAST 11/13/2024 06:12:00 AM TECHNIQUE: CT of the head was performed without the administration of intravenous contrast. Automated exposure control, iterative reconstruction, and/or weight based adjustment of the mA/kV was utilized to reduce the radiation dose to as low as reasonably achievable. COMPARISON: CT head 11/04/2024 and MRI 11/03/2024. CLINICAL HISTORY: 68 year old male with a history of moderate-severe head trauma, found down earlier this  month with posttraumatic intracranial hemorrhage. FINDINGS: Mild motion artifact today. BRAIN AND VENTRICLES: Regressed but not completely resolved inferior frontal gyrus hemorrhagic contusions, still visible on series 2 imaging 8. No other intracranial hemorrhage identified. Small bilateral low density subdural fluid collections now (coronal image 50 on the left, 4 mm and coronal image 46 on the right, 3 mm). These appear new or increased since the MRI on 11/03/2024. No hyperdense subdural blood products identified. Trace leftward midline shift on coronal image 35. Lobulated right anterior convexity hyperdense meningioma redemonstrated and stable on series 8 image 25, approximately 37 mm long axis. Stable underlying mild regional mass effect. No associated cerebral edema there. Unchanged subcentimeter dystrophic appearing calcification in the pons (series 2 image 10). Stable gray white differentiation. No evidence of acute infarct. No hydrocephalus. No suspicious intracranial vascular hyperdensity. ORBITS: No acute abnormality. SINUSES: Improved paranasal sinuses aeration. Tympanic cavities and mastoids remain well aerated. SOFT TISSUES AND SKULL: Regressed left forehead scalp hematoma. Regressed separate posterior vertex scalp hematoma. No scalp soft tissue gas. No skull fracture. IMPRESSION: 1. New small bilateral low-density subdural collections (4mm on the left , 3mm on the right). These could be posttraumatic CSF hygromas or low-density subdural Hematomas in this setting. Trace leftward midline shift is associated. 2. Regressed but not fully resolved inferior frontal gyrus hemorrhagic contusions. 3. Stable right anterior convexity Meningioma (37 mm). Electronically signed by: Helayne Hurst MD 11/13/2024 06:36 AM EST RP Workstation: [id number]   ECHOCARDIOGRAM COMPLETE BUBBLE STUDY Result Date: 11/12/2024    ECHOCARDIOGRAM REPORT   Patient Name:   Brendan [name]. Date of Exam: 11/12/2024 Medical Rec #:   [id number]       Height:       74.0 in Accession #:    [id number]      Weight:       283.5 lb Date of Birth:  [birth year]       BSA:          2.521 m Patient Age:    68 years        BP:           129/62 mmHg Patient Gender: M  HR:           66 bpm. Exam Location:  Inpatient Procedure: 2D Echo, Cardiac Doppler and Intracardiac Opacification Agent (Both            Spectral and Color Flow Doppler were utilized during procedure). Indications:    Endocarditis  History:        Patient has prior history of Echocardiogram examinations, most                 recent 05/07/2024. Risk Factors:Dyslipidemia, Diabetes and                 Hypertension.  Sonographer:    Merlynn Argyle Referring Phys: [id number] [facility]  Sonographer Comments: Image acquisition challenging due to patient body habitus. IMPRESSIONS  1. Left ventricular ejection fraction, by estimation, is 55 to 60%. The left ventricle has normal function. The left ventricle has no regional wall motion abnormalities. The left ventricular internal cavity size was moderately dilated. Left ventricular diastolic parameters are consistent with Grade I diastolic dysfunction (impaired relaxation).  2. Right ventricular systolic function is normal. The right ventricular size is normal.  3. The mitral valve is normal in structure. No evidence of mitral valve regurgitation. No evidence of mitral stenosis.  4. The aortic valve is tricuspid. There is mild calcification of the aortic valve. Aortic valve regurgitation is not visualized. No aortic stenosis is present.  5. The inferior vena cava is dilated in size with >50% respiratory variability, suggesting right atrial pressure of 8 mmHg. FINDINGS  Left Ventricle: Left ventricular ejection fraction, by estimation, is 55 to 60%. The left ventricle has normal function. The left ventricle has no regional wall motion abnormalities. Definity  contrast agent was given IV to delineate the left ventricular  endocardial borders. The  left ventricular internal cavity size was moderately dilated. There is no left ventricular hypertrophy. Left ventricular diastolic parameters are consistent with Grade I diastolic dysfunction (impaired relaxation). Right Ventricle: The right ventricular size is normal. No increase in right ventricular wall thickness. Right ventricular systolic function is normal. Left Atrium: Left atrial size was normal in size. Right Atrium: Right atrial size was normal in size. Pericardium: There is no evidence of pericardial effusion. Mitral Valve: The mitral valve is normal in structure. No evidence of mitral valve regurgitation. No evidence of mitral valve stenosis. Tricuspid Valve: The tricuspid valve is normal in structure. Tricuspid valve regurgitation is trivial. No evidence of tricuspid stenosis. Aortic Valve: The aortic valve is tricuspid. There is mild calcification of the aortic valve. Aortic valve regurgitation is not visualized. No aortic stenosis is present. Pulmonic Valve: The pulmonic valve was normal in structure. Pulmonic valve regurgitation is not visualized. No evidence of pulmonic stenosis. Aorta: The aortic root is normal in size and structure. Venous: The inferior vena cava is dilated in size with greater than 50% respiratory variability, suggesting right atrial pressure of 8 mmHg. IAS/Shunts: No atrial level shunt detected by color flow Doppler.  LEFT VENTRICLE PLAX 2D LVIDd:         6.60 cm   Diastology LVIDs:         5.50 cm   LV e' medial:    4.57 cm/s LV PW:         1.10 cm   LV E/e' medial:  16.4 LV IVS:        0.90 cm   LV e' lateral:   6.20 cm/s LVOT diam:     1.80 cm   LV E/e'  lateral: 12.1 LV SV:         53 LV SV Index:   21 LVOT Area:     2.54 cm  RIGHT VENTRICLE             IVC RV Basal diam:  3.60 cm     IVC diam: 2.40 cm RV S prime:     11.30 cm/s TAPSE (M-mode): 2.1 cm LEFT ATRIUM             Index        RIGHT ATRIUM           Index LA diam:        4.40 cm 1.75 cm/m   RA Area:     19.00 cm  LA Vol (A2C):   79.8 ml 31.66 ml/m  RA Volume:   49.20 ml  19.52 ml/m LA Vol (A4C):   59.8 ml 23.72 ml/m LA Biplane Vol: 69.4 ml 27.53 ml/m  AORTIC VALVE LVOT Vmax:   95.00 cm/s LVOT Vmean:  62.000 cm/s LVOT VTI:    0.208 m  AORTA Ao Root diam: 3.10 cm Ao Asc diam:  3.20 cm MITRAL VALVE MV Area (PHT): 1.85 cm    SHUNTS MV Decel Time: 410 msec    Systemic VTI:  0.21 m MV E velocity: 74.90 cm/s  Systemic Diam: 1.80 cm MV A velocity: 85.90 cm/s MV E/A ratio:  0.87 Toribio Fuel MD Electronically signed by Toribio Fuel MD Signature Date/Time: 11/12/2024/1:05:03 PM    Final     Medications: Infusions:  ceFEPime  (MAXIPIME ) IV 1 g (11/13/24 0944)   insulin  Stopped (11/12/24 1224)   metronidazole  500 mg (11/13/24 1001)    Scheduled Medications:  Chlorhexidine  Gluconate Cloth  6 each Topical Daily   Chlorhexidine  Gluconate Cloth  6 each Topical Q0600   insulin  aspart  0-6 Units Subcutaneous TID WC   insulin  aspart  2 Units Subcutaneous TID WC   insulin  glargine  12 Units Subcutaneous Daily   levothyroxine   125 mcg Oral q AM   simvastatin   20 mg Oral QPM   sodium chloride  flush  10-40 mL Intracatheter Q12H   sodium chloride  flush  3 mL Intravenous Q12H    have reviewed scheduled and prn medications.  Physical Exam: General:NAD, comfortable Heart:RRR, s1s2 nl Lungs:clear b/l, no crackle Abdomen:soft, Non-tender, +distended Extremities: Bilateral lower extremity+ Dialysis Access: TDC, site clean  Teghan Philbin Prasad Arine Foley 11/13/2024,10:56 AM  LOS: 2 days

## 2024-11-13 NOTE — Progress Notes (Addendum)
 Inpatient Diabetes Program Recommendations  AACE/ADA: New Consensus Statement on Inpatient Glycemic Control (2015)  Target Ranges:  Prepandial:   less than 140 mg/dL      Peak postprandial:   less than 180 mg/dL (1-2 hours)      Critically ill patients:  140 - 180 mg/dL   Lab Results  Component Value Date   GLUCAP 221 (H) 11/13/2024   HGBA1C 8.4 (H) 08/29/2024    Review of Glycemic Control  Latest Reference Range & Units 11/12/24 21:07 11/13/24 06:23  Glucose-Capillary 70 - 99 mg/dL 742 (H) 778 (H)   Diabetes history: DM 2 Outpatient Diabetes medications:  FSL3 Lantus  20 units daily Humalog  0-15 units tid with meals  Mounjaro  15 mg weekly Current orders for Inpatient glycemic control:  Novolog  0-6 units tid with meals  Novolog  2 units tid with meals Lantus  12 units daily Inpatient Diabetes Program Recommendations:    Please consider increasing Lantus  to 20 units daily.  Also consider increasing Novolog  meal coverage to 3 units tid with meals.  Will see patient today to discuss DM management.   Addendum:  Spoke to patient regarding DM.  He states that blood sugars began going up with infection.  He does wear a sensor and states prior to infection, his blood sugars were pretty good.  He see's Dr. Faythe (endocrinology).  Discussed A1C and CBG goals.  He has all supplies and insulins at home.  Will need close follow up.     Thanks,  Randall Bullocks, RN, BC-ADM Inpatient Diabetes Coordinator Pager [phone]  (8a-5p)

## 2024-11-13 NOTE — Evaluation (Signed)
 Physical Therapy Evaluation Patient Details Name: Brendan [name]. MRN: [id number] DOB: Apr 06, 1956 Today's Date: 11/13/2024  History of Present Illness  Pt is a 68 yo male who presented 11/11/24 with hypotension and generalized fatigue/weakness. Pt admitted with sepsis MSSA/prevotella source unclear but concern for [facility]. TDC removed 12/26. Of note, pt recently admitted 12/16-12/19 due to a fall with MRI showing large scalp contusion, trace SAH anterior frontal lobes, and small hemorrhagic interventricular contusions, Rt frontal meningioma. PMH - ESRD on HD, necrotizing fasciitis, DM2, HTN, hypothyroidism, OSA, obesity, CKD, CHF, glaucoma, gynecomastia, HLD, vertigo, obesity, peripheral neuropathy, prostate CA, sickle cell trait   Clinical Impression  Pt presents with condition above and deficits mentioned below, see PT Problem List. PTA, he was mod I using a rollator for functional mobility. He lives with his wife in a 1-level house with 4 STE. Currently, the pt is demonstrating deficits in sequencing, problem-solving, awareness of his limitations, balance, power, gross strength (R knee hyperextends), and activity tolerance. He is at high risk for falls, currently requiring modA to transition supine to sit EOB, minA to transfer to stand from an elevated surface, and min-modA to ambulate up to ~12 ft with a RW. He has had a drastic functional decline, is highly motivated to participate and improve, and appears to have good support at d/c, therefore he could greatly benefit from intensive inpatient rehab, > 3 hours/day. Will continue to follow acutely.        If plan is discharge home, recommend the following: A lot of help with walking and/or transfers;A lot of help with bathing/dressing/bathroom;Assistance with cooking/housework;Direct supervision/assist for medications management;Direct supervision/assist for financial management;Assist for transportation;Help with stairs or ramp for entrance;Supervision  due to cognitive status   Can travel by private vehicle        Equipment Recommendations Hospital bed;Wheelchair (measurements PT);Wheelchair cushion (measurements PT) (pending progress)  Recommendations for Other Services  Rehab consult;OT consult    Functional Status Assessment Patient has had a recent decline in their functional status and demonstrates the ability to make significant improvements in function in a reasonable and predictable amount of time.     Precautions / Restrictions Precautions Precautions: Fall Restrictions Weight Bearing Restrictions Per Provider Order: No      Mobility  Bed Mobility Overal bed mobility: Needs Assistance Bed Mobility: Supine to Sit     Supine to sit: Mod assist, HOB elevated, Used rails     General bed mobility comments: Pt required modA to ascend trunk to sit L EOB, HOB elevated. Cues provided for pt to bring legs off EOB then grab onto therapist while propping L elbow under his trunk to push through his L UE to ascend trunk. Pt left sitting EOB with family present to assist pt back to bed when pt is ready.    Transfers Overall transfer level: Needs assistance Equipment used: Rolling walker (2 wheels) Transfers: Sit to/from Stand Sit to Stand: Min assist, From elevated surface           General transfer comment: MinA needed for balance standing from elevated EOB to RW. Wide BOS noted.    Ambulation/Gait Ambulation/Gait assistance: Min assist, Mod assist Gait Distance (Feet): 12 Feet Assistive device: Rolling walker (2 wheels) Gait Pattern/deviations: Step-to pattern, Decreased step length - right, Decreased step length - left, Decreased stride length, Knee flexed in stance - right, Trunk flexed, Wide base of support Gait velocity: reduced Gait velocity interpretation: <1.31 ft/sec, indicative of household ambulator   General Gait Details: Pt  takes slow, small, wide, and unsteady steps with the RW. Pt displayed R knee  instability, primarily hyperextending it when ambulating, which seemed to worsen as distance progressed. He needed minA for balance and modA to manage the RW when turning and stepping back to the bed to prepare to sit.  Stairs            Wheelchair Mobility     Tilt Bed    Modified Rankin (Stroke Patients Only)       Balance Overall balance assessment: Needs assistance Sitting-balance support: No upper extremity supported, Feet supported Sitting balance-Leahy Scale: Good Sitting balance - Comments: Sits unsupported EOB with supervision, no LOB   Standing balance support: Bilateral upper extremity supported, During functional activity, Reliant on assistive device for balance Standing balance-Leahy Scale: Poor Standing balance comment: reliant on RW and minA                             Pertinent Vitals/Pain Pain Assessment Pain Assessment: Faces Faces Pain Scale: No hurt Pain Intervention(s): Monitored during session    Home Living Family/patient expects to be discharged to:: Private residence Living Arrangements: Spouse/significant other Available Help at Discharge: Family;Available PRN/intermittently Type of Home: House Home Access: Stairs to enter Entrance Stairs-Rails: Right;Left;Can reach both Entrance Stairs-Number of Steps: 4   Home Layout: One level Home Equipment: Cane - single point;Hand held Programmer, Systems (2 wheels);BSC/3in1;Rollator (4 wheels) Additional Comments: Pt lives with wife who works during the day. Wife takes him to HD before going to work and then picks him up during her lunch hour and takes him home.    Prior Function Prior Level of Function : Independent/Modified Independent             Mobility Comments: Modified independent with rollator. ADLs Comments: Mod I with ADLs aside from assist with compression stockings. Has aide assist 1x/wk     Extremity/Trunk Assessment   Upper Extremity Assessment Upper  Extremity Assessment: Defer to OT evaluation    Lower Extremity Assessment Lower Extremity Assessment: RLE deficits/detail;LLE deficits/detail;Generalized weakness RLE Deficits / Details: MMT score of 4+ quads, 4 anterior tibialis; noted knee weakness/muscular imbalance with noted hyperextension when standing/ambulating LLE Deficits / Details: MMT score of 4+ quads, 4 anterior tibialis    Cervical / Trunk Assessment Cervical / Trunk Assessment: Normal  Communication   Communication Communication: No apparent difficulties    Cognition Arousal: Alert Behavior During Therapy: Flat affect   PT - Cognitive impairments: Problem solving, Rancho level, Awareness, Sequencing                   Rancho Levels of Cognitive Functioning Rancho Los Amigos Scales of Cognitive Functioning: Purposeful, Appropriate: Stand-by Assistance Rancho Mirant Scales of Cognitive Functioning: Purposeful, Appropriate: Stand-by Assistance [VIII] PT - Cognition Comments: slow processing and needs cues to problem solve how to get arm under his body to push his trunk up to sit EOB. Needed cues to be aware of his limitations Following commands: Impaired Following commands impaired: Follows multi-step commands with increased time     Cueing Cueing Techniques: Verbal cues, Tactile cues     General Comments General comments (skin integrity, edema, etc.): Monitor alarming Irregular HR but HR in 80s and BP stable (yet high with SBP 150s); Encouraged OOB with nursing as able to reduce deconditioning.    Exercises     Assessment/Plan    PT Assessment Patient needs continued PT services  PT Problem List Decreased strength;Decreased activity tolerance;Decreased balance;Decreased mobility;Decreased cognition       PT Treatment Interventions DME instruction;Stair training;Gait training;Functional mobility training;Therapeutic activities;Therapeutic exercise;Neuromuscular re-education;Balance  training;Cognitive remediation;Patient/family education    PT Goals (Current goals can be found in the Care Plan section)  Acute Rehab PT Goals Patient Stated Goal: to go to [facility] PT Goal Formulation: With patient/family Time For Goal Achievement: 11/27/24 Potential to Achieve Goals: Good    Frequency Min 3X/week     Co-evaluation               AM-PAC PT 6 Clicks Mobility  Outcome Measure Help needed turning from your back to your side while in a flat bed without using bedrails?: A Little Help needed moving from lying on your back to sitting on the side of a flat bed without using bedrails?: A Lot Help needed moving to and from a bed to a chair (including a wheelchair)?: A Little Help needed standing up from a chair using your arms (e.g., wheelchair or bedside chair)?: A Little Help needed to walk in hospital room?: Total (<20 ft) Help needed climbing 3-5 steps with a railing? : Total 6 Click Score: 13    End of Session Equipment Utilized During Treatment: Gait belt Activity Tolerance: Patient tolerated treatment well Patient left: in bed;with call bell/phone within reach;with bed alarm set;with family/visitor present (sitting EOB with family reporting they will assist pt back to bed) Nurse Communication: Mobility status;Other (comment) (pt sitting EOB with family present) PT Visit Diagnosis: Other abnormalities of gait and mobility (R26.89);Muscle weakness (generalized) (M62.81);Unsteadiness on feet (R26.81);Difficulty in walking, not elsewhere classified (R26.2);History of falling (Z91.81)    Time: 8560-8498 PT Time Calculation (min) (ACUTE ONLY): 22 min   Charges:   PT Evaluation $PT Eval Moderate Complexity: 1 Mod   PT General Charges $$ ACUTE PT VISIT: 1 Visit         Brendan [name], PT, DPT Acute Rehabilitation Services  Office: [phone]   Brendan CHRISTELLA [name] 11/13/2024, 3:25 PM

## 2024-11-13 NOTE — Progress Notes (Signed)
 PT Cancellation Note  Patient Details Name: Brendan [name]. MRN: [id number] DOB: [birth year]   Cancelled Treatment:    Reason Eval/Treat Not Completed: (P) Fatigue/lethargy limiting ability to participate. Pt reporting he did not get much rest last night, requesting PT check back later in the day as time permits.   Theo Ferretti, PT, DPT Acute Rehabilitation Services  Office: [phone]    Theo CHRISTELLA Ferretti 11/13/2024, 8:26 AM

## 2024-11-13 NOTE — Progress Notes (Signed)
" ° °  Inpatient Rehab Admissions Coordinator :  Per therapy recommendations, patient was screened for CIR candidacy by Heron Leavell RN MSN.  At this time patient appears to be a potential candidate for CIR. Noted last admit he and wife did not want to pursue CIR admit. I will place a rehab consult per protocol for full assessment. Please call me with any questions.  Heron Leavell RN MSN Admissions Coordinator [phone]   "

## 2024-11-14 DIAGNOSIS — A419 Sepsis, unspecified organism: Secondary | ICD-10-CM | POA: Diagnosis not present

## 2024-11-14 LAB — RENAL FUNCTION PANEL
Albumin: 2.2 g/dL — ABNORMAL LOW (ref 3.5–5.0)
Anion gap: 11 (ref 5–15)
BUN: 44 mg/dL — ABNORMAL HIGH (ref 8–23)
CO2: 24 mmol/L (ref 22–32)
Calcium: 8.1 mg/dL — ABNORMAL LOW (ref 8.9–10.3)
Chloride: 92 mmol/L — ABNORMAL LOW (ref 98–111)
Creatinine, Ser: 4.54 mg/dL — ABNORMAL HIGH (ref 0.61–1.24)
GFR, Estimated: 13 mL/min — ABNORMAL LOW
Glucose, Bld: 262 mg/dL — ABNORMAL HIGH (ref 70–99)
Phosphorus: 3.1 mg/dL (ref 2.5–4.6)
Potassium: 4.1 mmol/L (ref 3.5–5.1)
Sodium: 127 mmol/L — ABNORMAL LOW (ref 135–145)

## 2024-11-14 LAB — CBC WITH DIFFERENTIAL/PLATELET
Basophils Absolute: 0 K/uL (ref 0.0–0.1)
Basophils Relative: 0 %
Eosinophils Absolute: 0.2 K/uL (ref 0.0–0.5)
Eosinophils Relative: 1 %
HCT: 27.1 % — ABNORMAL LOW (ref 39.0–52.0)
Hemoglobin: 9.3 g/dL — ABNORMAL LOW (ref 13.0–17.0)
Lymphocytes Relative: 7 %
Lymphs Abs: 1.7 K/uL (ref 0.7–4.0)
MCH: 27.9 pg (ref 26.0–34.0)
MCHC: 34.3 g/dL (ref 30.0–36.0)
MCV: 81.4 fL (ref 80.0–100.0)
Monocytes Absolute: 1.7 K/uL — ABNORMAL HIGH (ref 0.1–1.0)
Monocytes Relative: 7 %
Neutro Abs: 20.1 K/uL — ABNORMAL HIGH (ref 1.7–7.7)
Neutrophils Relative %: 85 %
Platelets: 77 K/uL — ABNORMAL LOW (ref 150–400)
RBC: 3.33 MIL/uL — ABNORMAL LOW (ref 4.22–5.81)
RDW: 16.4 % — ABNORMAL HIGH (ref 11.5–15.5)
Smear Review: NORMAL
WBC: 23.7 K/uL — ABNORMAL HIGH (ref 4.0–10.5)
nRBC: 0 % (ref 0.0–0.2)

## 2024-11-14 LAB — GLUCOSE, CAPILLARY
Glucose-Capillary: 279 mg/dL — ABNORMAL HIGH (ref 70–99)
Glucose-Capillary: 287 mg/dL — ABNORMAL HIGH (ref 70–99)
Glucose-Capillary: 279 mg/dL — ABNORMAL HIGH (ref 70–99)
Glucose-Capillary: 356 mg/dL — ABNORMAL HIGH (ref 70–99)
Glucose-Capillary: 285 mg/dL — ABNORMAL HIGH (ref 70–99)

## 2024-11-14 LAB — HEPATITIS B SURFACE ANTIBODY, QUANTITATIVE: Hep B S AB Quant (Post): 736 m[IU]/mL

## 2024-11-14 MED ORDER — DARBEPOETIN ALFA 100 MCG/0.5ML IJ SOSY
100.0000 ug | PREFILLED_SYRINGE | INTRAMUSCULAR | Status: AC
  Administered 2024-11-14: 100 ug via SUBCUTANEOUS
  Filled 2024-11-14: qty 0.5

## 2024-11-14 NOTE — Evaluation (Signed)
 Occupational Therapy Evaluation Patient Details Name: Brendan [name]. MRN: [id number] DOB: 1956-01-18 Today's Date: 11/14/2024   History of Present Illness   Pt is a 68 yo male who presented 11/11/24 with hypotension and generalized fatigue/weakness. Pt admitted with sepsis MSSA/prevotella source unclear but concern for [facility]. TDC removed 12/26. Of note, pt recently admitted 12/16-12/19 due to a fall with MRI showing large scalp contusion, trace SAH anterior frontal lobes, and small hemorrhagic interventricular contusions, Rt frontal meningioma. PMH - ESRD on HD, necrotizing fasciitis, DM2, HTN, hypothyroidism, OSA, obesity, CKD, CHF, glaucoma, gynecomastia, HLD, vertigo, obesity, peripheral neuropathy, prostate CA, sickle cell trait     Clinical Impressions Pt admitted based on above, and was seen based on problem list below. Pt reports prior to recent admission 12/16  he was mod I. Since recent hospitalization, wife has been assisting with LB ADLs. Today pt is requiring set up  to mod assist for ADLs. Bed mobility and functional transfers are  min  assist from RW. Pt limited by decreased activity tolerance, strength, and balance. Given pt significant decline in functional status, recommending >3 hours of skilled rehab daily. OT will continue to follow acutely to maximize functional independence.    If plan is discharge home, recommend the following:   A lot of help with walking and/or transfers;A lot of help with bathing/dressing/bathroom;Assistance with cooking/housework;Assist for transportation     Functional Status Assessment   Patient has had a recent decline in their functional status and demonstrates the ability to make significant improvements in function in a reasonable and predictable amount of time.     Equipment Recommendations   Other (comment) (Defer to next venue)     Recommendations for Other Services   Rehab consult     Precautions/Restrictions    Precautions Precautions: Fall Recall of Precautions/Restrictions: Intact Restrictions Weight Bearing Restrictions Per Provider Order: No     Mobility Bed Mobility Overal bed mobility: Needs Assistance Bed Mobility: Supine to Sit, Sit to Supine     Supine to sit: Min assist, HOB elevated, Used rails Sit to supine: Contact guard assist, Used rails   General bed mobility comments: Cues for sequencing, min assist for trunk. Pt able to return BLEs to bed and adjust trunk in bed    Transfers Overall transfer level: Needs assistance Equipment used: Rolling walker (2 wheels) Transfers: Sit to/from Stand Sit to Stand: Min assist, From elevated surface     General transfer comment: Min assist to rise from elevated bed height. Cues for hand placement.      Balance Overall balance assessment: Needs assistance Sitting-balance support: No upper extremity supported, Feet supported Sitting balance-Leahy Scale: Good     Standing balance support: Bilateral upper extremity supported, During functional activity, Reliant on assistive device for balance Standing balance-Leahy Scale: Poor Standing balance comment: Reliant on RW       ADL either performed or assessed with clinical judgement   ADL Overall ADL's : Needs assistance/impaired Eating/Feeding: Set up;Sitting   Grooming: Set up;Sitting   Upper Body Bathing: Set up;Sitting   Lower Body Bathing: Moderate assistance;Sit to/from stand   Upper Body Dressing : Set up;Sitting   Lower Body Dressing: Moderate assistance;Sit to/from stand   Toilet Transfer: Moderate assistance;Ambulation;Rolling walker (2 wheels) Toilet Transfer Details (indicate cue type and reason): ~65ft limited activity tolerance         Functional mobility during ADLs: Moderate assistance;Rolling walker (2 wheels) General ADL Comments: Limited by decreased activity tolerance and strength  Vision Baseline Vision/History: 0 No visual deficits Patient  Visual Report: No change from baseline Vision Assessment?: No apparent visual deficits            Pertinent Vitals/Pain Pain Assessment Pain Assessment: Faces Faces Pain Scale: Hurts a little bit Pain Location: BLEs Pain Descriptors / Indicators: Aching, Grimacing Pain Intervention(s): Monitored during session     Extremity/Trunk Assessment Upper Extremity Assessment Upper Extremity Assessment: Generalized weakness   Lower Extremity Assessment Lower Extremity Assessment: Defer to PT evaluation   Cervical / Trunk Assessment Cervical / Trunk Assessment: Normal   Communication Communication Communication: No apparent difficulties   Cognition Arousal: Alert Behavior During Therapy: Flat affect Cognition: Cognition impaired     Awareness: Online awareness impaired     Executive functioning impairment (select all impairments): Problem solving OT - Cognition Comments: Delayed processing and problem solving. Increased time for tasks   Following commands: Impaired Following commands impaired: Follows multi-step commands with increased time     Cueing  General Comments   Cueing Techniques: Verbal cues;Tactile cues  VSS on RA           Home Living Family/patient expects to be discharged to:: Private residence Living Arrangements: Spouse/significant other Available Help at Discharge: Family;Available PRN/intermittently Type of Home: House Home Access: Stairs to enter [employer] of Steps: 4 Entrance Stairs-Rails: Right;Left;Can reach both Home Layout: One level     Bathroom Shower/Tub: Producer, Television/film/video: Standard Bathroom Accessibility: Yes   Home Equipment: Cane - single point;Hand held Programmer, Systems (2 wheels);BSC/3in1;Rollator (4 wheels)   Additional Comments: Pt lives with wife who works during the day. Wife takes him to HD before going to work and then picks him up during her lunch hour and takes him home.       Prior Functioning/Environment Prior Level of Function : Independent/Modified Independent     Mobility Comments: Mod I, reports primarily use of rollator to walk ADLs Comments: Reports previously mod I, however wife has been assisting with LB ADLs since 12/16 admission    OT Problem List: Decreased strength;Decreased activity tolerance;Impaired balance (sitting and/or standing);Decreased cognition;Decreased knowledge of use of DME or AE;Decreased knowledge of precautions;Cardiopulmonary status limiting activity   OT Treatment/Interventions: Self-care/ADL training;Therapeutic exercise;Energy conservation;DME and/or AE instruction;Therapeutic activities;Patient/family education;Balance training      OT Goals(Current goals can be found in the care plan section)   Acute Rehab OT Goals Patient Stated Goal: To get better OT Goal Formulation: With patient Time For Goal Achievement: 11/27/24 Potential to Achieve Goals: Good   OT Frequency:  Min 2X/week       AM-PAC OT 6 Clicks Daily Activity     Outcome Measure Help from another person eating meals?: None Help from another person taking care of personal grooming?: A Little Help from another person toileting, which includes using toliet, bedpan, or urinal?: Total Help from another person bathing (including washing, rinsing, drying)?: A Lot Help from another person to put on and taking off regular upper body clothing?: A Little Help from another person to put on and taking off regular lower body clothing?: A Lot 6 Click Score: 15   End of Session Equipment Utilized During Treatment: Gait belt;Rolling walker (2 wheels) Nurse Communication: Mobility status  Activity Tolerance: Patient tolerated treatment well Patient left: in bed;with call bell/phone within reach;with bed alarm set  OT Visit Diagnosis: Other abnormalities of gait and mobility (R26.89);Unsteadiness on feet (R26.81);Muscle weakness (generalized) (M62.81);History of  falling (Z91.81)  Time: 9095-9050 OT Time Calculation (min): 45 min Charges:  OT General Charges $OT Visit: 1 Visit OT Evaluation $OT Eval Moderate Complexity: 1 Mod OT Treatments $Self Care/Home Management : 23-37 mins  Adrianne BROCKS, OT  Acute Rehabilitation Services Office [phone] Secure chat preferred   Adrianne GORMAN Savers 11/14/2024, 9:58 AM

## 2024-11-14 NOTE — Plan of Care (Signed)
  Problem: Education: Goal: Ability to describe self-care measures that may prevent or decrease complications (Diabetes Survival Skills Education) will improve Outcome: Progressing Goal: Individualized Educational Video(s) Outcome: Progressing   Problem: Coping: Goal: Ability to adjust to condition or change in health will improve Outcome: Progressing   Problem: Fluid Volume: Goal: Ability to maintain a balanced intake and output will improve Outcome: Progressing   Problem: Health Behavior/Discharge Planning: Goal: Ability to identify and utilize available resources and services will improve Outcome: Progressing Goal: Ability to manage health-related needs will improve Outcome: Progressing   Problem: Metabolic: Goal: Ability to maintain appropriate glucose levels will improve Outcome: Progressing   Problem: Nutritional: Goal: Maintenance of adequate nutrition will improve Outcome: Progressing Goal: Progress toward achieving an optimal weight will improve Outcome: Progressing   Problem: Skin Integrity: Goal: Risk for impaired skin integrity will decrease Outcome: Progressing   Problem: Tissue Perfusion: Goal: Adequacy of tissue perfusion will improve Outcome: Progressing   Problem: Education: Goal: Knowledge of General Education information will improve Description: Including pain rating scale, medication(s)/side effects and non-pharmacologic comfort measures Outcome: Progressing   Problem: Health Behavior/Discharge Planning: Goal: Ability to manage health-related needs will improve Outcome: Progressing   Problem: Clinical Measurements: Goal: Ability to maintain clinical measurements within normal limits will improve Outcome: Progressing Goal: Will remain free from infection Outcome: Progressing Goal: Diagnostic test results will improve Outcome: Progressing Goal: Respiratory complications will improve Outcome: Progressing Goal: Cardiovascular complication will  be avoided Outcome: Progressing   Problem: Activity: Goal: Risk for activity intolerance will decrease Outcome: Progressing   Problem: Nutrition: Goal: Adequate nutrition will be maintained Outcome: Progressing   Problem: Coping: Goal: Level of anxiety will decrease Outcome: Progressing   Problem: Elimination: Goal: Will not experience complications related to bowel motility Outcome: Progressing Goal: Will not experience complications related to urinary retention Outcome: Progressing   Problem: Pain Managment: Goal: General experience of comfort will improve and/or be controlled Outcome: Progressing   Problem: Safety: Goal: Ability to remain free from injury will improve Outcome: Progressing   Problem: Skin Integrity: Goal: Risk for impaired skin integrity will decrease Outcome: Progressing   Problem: Education: Goal: Ability to describe self-care measures that may prevent or decrease complications (Diabetes Survival Skills Education) will improve Outcome: Progressing Goal: Individualized Educational Video(s) Outcome: Progressing   Problem: Cardiac: Goal: Ability to maintain an adequate cardiac output will improve Outcome: Progressing   Problem: Health Behavior/Discharge Planning: Goal: Ability to identify and utilize available resources and services will improve Outcome: Progressing Goal: Ability to manage health-related needs will improve Outcome: Progressing   Problem: Fluid Volume: Goal: Ability to achieve a balanced intake and output will improve Outcome: Progressing   Problem: Metabolic: Goal: Ability to maintain appropriate glucose levels will improve Outcome: Progressing   Problem: Nutritional: Goal: Maintenance of adequate nutrition will improve Outcome: Progressing Goal: Maintenance of adequate weight for body size and type will improve Outcome: Progressing   Problem: Respiratory: Goal: Will regain and/or maintain adequate  ventilation Outcome: Progressing   Problem: Urinary Elimination: Goal: Ability to achieve and maintain adequate renal perfusion and functioning will improve Outcome: Progressing

## 2024-11-14 NOTE — Progress Notes (Signed)
 [facility]  Brendan [name]. FMW:[id number]  DOB: March 03, 1956  DOA: 11/11/2024  PCP: Brendan Ingle, MD  11/14/2024,9:22 AM  LOS: 3 days    Code Status: Full code     from: Home   68 year old history of Fournier's gangrene 08/2024 status post I&D drainage wound 08/30/2024 Dr. Salley was felt secondary to use of Farxiga -cultures grew strep constellatus MSSA Prevotella + beta-lactamase positive and patient was placed on Augmentin  at time of discharge EOT 09/13/2024-discharge weight 301 10/28 readmitted SOB-AKI worsening 18 pound weight gain-cardiology nephrology consulted 2/2 NYHA IV symptoms--started on IHD status post tunneled cath 09/22/2024 12/16 readmission obtunded on the floor WBC 17,000 CT contusion with TBI?  Meningioma-Rx Keppra  and was not discharged on the same as neurology did not feel this was a necessity at that time--- was discharged 12/19  Chr med h/o--OSA HTN IDDM hypothyroid HFpEF gout  12/24 came back to the emergency room feeling weak blood pressure low has been on 4 times a week HD had a fever-CBG noted to be 380 lactic acid 1.4 Tmax 101 WBC 21.6 blood pressure 90/40 nephrology consulted-crystalloid bolus fluids given Vanco Flagyl  cefepime  added 12/25 early a.m. seem to be in DKA transferred to progressive unit 2/2 concern for DKA CT head cystic hygromas 4 mm right 3 mm left trace leftward midline shift regressed not resolved inferior frontal gyrus hemorrhagic contusion--37 mm stable right meningioma 12/26 underwent HD and HD cath removed by IR   Assessment  & Plan :    Sepsis MSSA/Prevotella source unclear DDx groin DDx left hand versus indwelling right HD cath [facility] Hypotensive on admission Defer to ID-TEE planned today? continue cefepime  Flagyl  vancomycin  broad coverage, WBC elevated still Examined perineal area-looks clean mild blood-see pictures from prior note 12/26 Raises the possibility that this is most likely not the source and that the portal of entry  MSSA was from  hands/denuded skin  Hyperglycemia ? DKA??  On 12/25 CBGs much better controlled-continues on long-acting-+TEE in am so cut back dose to 9U and observe-CBG 262-287 Resume Mounjaro  15 q. Wednesday at discharge  Immunosuppression secondary to psoriasis follows with Dr. Dianah (dermatology) for the past 6 years Has not been able to obtain Bimzelx yet hold clobetasol  at this time  Newly declared ESRD-dialyzing via right tunneled cath placed 09/22/2024 Dialyzed 12/26 HD cath removed giving line holiday Defer to ID/nephro went to place next HD cath  HFpEF NYHA IV Never again candidate SGLT2 given groin wounds Amlodipine  5 held Coreg  12.5 twice daily held, torsemide  100 MT ThF held  Hypothyroidism Continue Synthroid  125  Recent TBI brain managed nonsurgically during hospitalization 12/16-12/9. 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 Victory Boers 12/26 neurosurgery --absence of mental status changes focal deficit --nothing surgically specific to do in this case  Scrotal wound in the setting of Fournier's gangrene as above Last seen [facility] surgery 10/13/2024-wound reviewed today with nursing 12/26   BMI 36 Outpatient evaluation  Data Reviewed today: Sodium 127 potassium 4.1 BUN/creatinine 44/4.5 WBC 23-hemoglobin 9.3-platelets 77 normal morphology platelets   DVT prophylaxis: TED hose   Dispo/Global plan: Inpatient pending resolution-pretty ill but improvement     Subjective:   Well no distress comfortable appearing mild discomfort at chest, power 5/5 he is coherent looking good overall Objective + exam Vitals:   11/13/24 1941 11/13/24 2302 11/14/24 0307 11/14/24 0738  BP: (!) 105/58 104/62 124/63 125/66  Pulse:  89 82 71 73  Resp: 20 20 20 13   Temp: 98.9 F (37.2 C) 98.7 F (37.1 C) 98.1 F (36.7 C) 97.8 F (36.6 C)  TempSrc: Oral Oral Oral Oral  SpO2: 98%  100% 100% 95%  Weight:       Filed Weights   11/12/24 0544  Weight: 128.6 kg     Examination:  EOMI NCAT tunneled PICC removed--power 5/5--CTAB no wheeze rales rhonchi--S1-S2 no murmur--abdomen soft no rebound no guarding--trace edema--hand looks about the same--ROM intact  Scheduled Meds:  calcitRIOL   0.25 mcg Oral Daily   Chlorhexidine  Gluconate Cloth  6 each Topical Daily   Chlorhexidine  Gluconate Cloth  6 each Topical Q0600   insulin  aspart  0-6 Units Subcutaneous TID WC   insulin  aspart  2 Units Subcutaneous TID WC   insulin  glargine  9 Units Subcutaneous Daily   levothyroxine   125 mcg Oral q AM   simvastatin   20 mg Oral QPM   sodium chloride  flush  10-40 mL Intracatheter Q12H   sodium chloride  flush  3 mL Intravenous Q12H   Continuous Infusions:  ceFEPime  (MAXIPIME ) IV Stopped (11/13/24 1021)   acetaminophen  **OR** acetaminophen , albuterol , dextrose , ondansetron  **OR** ondansetron  (ZOFRAN ) IV, sodium chloride  flush  Time 40  Colen Grimes, MD  [facility]

## 2024-11-14 NOTE — PMR Pre-admission (Signed)
 PMR Admission Coordinator Pre-Admission Assessment  Patient: Brendan [name]. is an 68 y.o., male MRN: [id number] DOB: 1956/07/31 Height:   Weight: 127.6 kg  Insurance Information HMO: yes    PPO:      PCP:      IPA:      80/20:      OTHER:  PRIMARY: BCBS Medicare      Policy#: [id number]      Subscriber: patient CM Name: Harlene       Phone#: [phone]     Fax#: [phone] Pre-Cert#: [id number] auth for CIR from Jessica with [facility] Medicare for admit 11/23/24 with next review date tbd.  Updates due to Harlene at fax listed above.       Employer:  Benefits:  Phone #: [phone]     Name:  Eff. Date: 11/19/24-11/18/2198     Deduct: does not have one      Out of Pocket Max: $4,900 ($0 met)      Life Max: NA CIR: $400 co-pay/day for days 1-6, 100% coverage for days 7-90      SNF: 100% coverage for days 1-20, $218 co-pay/day for days 21-60 Outpatient: $15 co-pay/visit     Co-Pay:  Home Health: 100% coverage      Co-Pay:  DME: 80% coverage     Co-Pay: 20% co-insurance Providers: in-network SECONDARY:       Policy#:      Phone#:   Artist:       Phone#:   The Best Boy for patients in Inpatient Rehabilitation Facilities with attached Privacy Act Statement-Health Care Records was provided and verbally reviewed with: Patient and Family  Emergency Contact Information Contact Information     Name Relation Home Work Mobile   [place] Spouse [phone]      Other Contacts   None on File     Current Medical History  Patient Admitting Diagnosis: debility History of Present Illness: Pt is a 68 year old male with medical hx significant for: ESRD on HD, h/o Fournier's gangrene s/p I&D drainage wound (08/2024)- cultures grew strep constellatus MSSA Prevotella + beta-lactamase positive, OSA, HTN, IDDM, hypothyroid, HFpEF, gout.  Pt readmitted on 10/28 d/t SOB and AKI worsening. Started on iHD s/p tunneled cath (09/22/24).  Pt readmitted on 12/16 d/t fall. Imaging revealed contusion with TBI and meningioma. Pt presented to [facility] on 11/11/24 d/t weakness, fever and hypotension d/t sepsis. Nephrology consulted. Recommended removal of tunneled HD catheter.Concern for DKA on 12/25. CT on 12/26, d/t reported fall on 12/23, showed cystic hygromas. Neurosurgery is not recommending surgical intervention. TEE on 12/26 which showed no vegetation. TDC removed on 12/26 and replaced on 12/30 after line holiday.  Tolerating HD TRS.   ID following and recommended cefepime  with EOT date 11/28/23.SABRA  Therapy evaluations completed and CIR recommended d/t pt's deficits in functional mobility.  Complete NIHSS TOTAL: 0  Patient's medical record from [facility] has been reviewed by the rehabilitation admission coordinator and physician.  Past Medical History  Past Medical History:  Diagnosis Date   Acanthosis nigricans    Atopic dermatitis    CHF (congestive heart failure) (HCC)    CKD (chronic kidney disease) stage 4, GFR 15-29 ml/min (HCC) 01/08/2023   Diabetes (HCC) 11/19/2002   Erectile dysfunction    Fatty liver 11/19/2005   Glaucoma 11/19/2006   Gynecomastia 11/20/2007   Hx of adenomatous colonic polyps 08/26/2023   Hyperaldosteronism 11/19/1998   Hypercholesterolemia    Hyperlipidemia  2010   Hypertension 1999   Hypoglycemic reaction    Hypothyroidism 11/19/2002   Incontinence    Intermittent vertigo 11/19/2010   Left cervical radiculopathy 11/19/2010   Lumbar radiculopathy    Obesity    Peripheral neuropathy    Pollen allergies 11/19/2005   perennial   Prostate cancer (HCC) 11/19/2004   Reflux esophagitis 11/19/1993   Sickle cell trait 11/19/2004   Sleep apnea, obstructive 11/19/1998   uses a cpap   Venous insufficiency 11/20/2003   Vitamin D deficiency 11/19/2010    Has the patient had major surgery during 100 days prior to admission? Yes  Family History   family history includes COPD  in his sister; CVA in his sister; Diabetes in his daughter; Heart attack in his father; Heart failure in his mother; Hypertension in his father.  Current Medications Current Medications[1]  Patients Current Diet:  Diet Order             Diet NPO time specified Except for: Sips with Meds  Diet effective midnight           Diet Carb Modified Room service appropriate? Yes  Diet effective now                   Precautions / Restrictions Precautions Precautions: Fall Restrictions Weight Bearing Restrictions Per Provider Order: No   Has the patient had 2 or more falls or a fall with injury in the past year? Yes  Prior Activity Level Community (5-7x/wk): gets out of house ~4 days/week  Prior Functional Level Self Care: Did the patient need help bathing, dressing, using the toilet or eating? Needed some help  Indoor Mobility: Did the patient need assistance with walking from room to room (with or without device)? Independent  Stairs: Did the patient need assistance with internal or external stairs (with or without device)? Independent  Functional Cognition: Did the patient need help planning regular tasks such as shopping or remembering to take medications? Independent  Patient Information Are you of Hispanic, Latino/a,or Spanish origin?: A. No, not of Hispanic, Latino/a, or Spanish origin What is your race?: B. Black or African American Do you need or want an interpreter to communicate with a doctor or health care staff?: 0. No  Patient's Response To:  Health Literacy and Transportation Is the patient able to respond to health literacy and transportation needs?: Yes Health Literacy - How often do you need to have someone help you when you read instructions, pamphlets, or other written material from your doctor or pharmacy?: Never In the past 12 months, has lack of transportation kept you from medical appointments or from getting medications?: No In the past 12 months, has  lack of transportation kept you from meetings, work, or from getting things needed for daily living?: No  Home Assistive Devices / Equipment Home Equipment: Cane - single point, Higher Education Careers Adviser held shower head, Agricultural Consultant (2 wheels), BSC/3in1, Rollator (4 wheels)  Prior Device Use: Indicate devices/aids used by the patient prior to current illness, exacerbation or injury? Walker  Current Functional Level Cognition  Orientation Level: Oriented X4 [place] [employer] Scales of Cognitive Functioning: Purposeful, Appropriate: Stand-by Assistance    Extremity Assessment (includes Sensation/Coordination)  Upper Extremity Assessment: Generalized weakness  Lower Extremity Assessment: Defer to PT evaluation RLE Deficits / Details: MMT score of 4+ quads, 4 anterior tibialis; noted knee weakness/muscular imbalance with noted hyperextension when standing/ambulating LLE Deficits / Details: MMT score of 4+ quads, 4 anterior tibialis    ADLs  Overall ADL's : Needs assistance/impaired Eating/Feeding: Set up, Sitting Grooming: Set up, Sitting Upper Body Bathing: Set up, Sitting Lower Body Bathing: Moderate assistance, Sit to/from stand Upper Body Dressing : Set up, Sitting Lower Body Dressing: Moderate assistance, Sit to/from stand Toilet Transfer: Moderate assistance, Ambulation, Rolling walker (2 wheels) Toilet Transfer Details (indicate cue type and reason): ~66ft limited activity tolerance Functional mobility during ADLs: Moderate assistance, Rolling walker (2 wheels) General ADL Comments: Limited by decreased activity tolerance and strength    Mobility  Overal bed mobility: Needs Assistance Bed Mobility: Supine to Sit Supine to sit: Mod assist Sit to supine: Contact guard assist, Used rails General bed mobility comments: Cues for sequencing, mod assist for trunk.    Transfers  Overall transfer level: Needs assistance Equipment used: Rolling walker (2 wheels) Transfers: Sit to/from Stand Sit to  Stand: Min assist, From elevated surface General transfer comment: Min assist to rise from elevated bed height. Cues for hand placement.    Ambulation / Gait / Stairs / Wheelchair Mobility  Ambulation/Gait Ambulation/Gait assistance: Editor, Commissioning (Feet): 2 Feet Assistive device: Rolling walker (2 wheels) Gait Pattern/deviations: Step-to pattern, Decreased step length - right, Decreased step length - left, Decreased stride length, Knee flexed in stance - right, Trunk flexed, Wide base of support General Gait Details: pt taking steps along EOB to HOB, declining ambulation in room/away from EOB Gait velocity: reduced Gait velocity interpretation: <1.31 ft/sec, indicative of household ambulator    Posture / Balance Dynamic Sitting Balance Sitting balance - Comments: Sits unsupported EOB with supervision, no LOB, seated up EOB at end of session with lunch tray set up Balance Overall balance assessment: Needs assistance Sitting-balance support: No upper extremity supported, Feet supported Sitting balance-Leahy Scale: Good Sitting balance - Comments: Sits unsupported EOB with supervision, no LOB, seated up EOB at end of session with lunch tray set up Standing balance support: Bilateral upper extremity supported, During functional activity, Reliant on assistive device for balance Standing balance-Leahy Scale: Poor Standing balance comment: Reliant on RW    Special considerations/life events  Skin Scratch marks: arm, back, buttocks/bilateral; Surgical Incision: perineum and Diabetic management Novolog  0-6 units 3x daily with meals; Lantus  9 units daily; Novolog  2 units 3x daily with meals   Previous Home Environment (from acute therapy documentation) Living Arrangements: Spouse/significant other Available Help at Discharge: Family, Available PRN/intermittently Type of Home: House Home Layout: One level Home Access: Stairs to enter Entrance Stairs-Rails: Right, Left, Can reach  both Entrance Stairs-Number of Steps: 4 Bathroom Shower/Tub: Health Visitor: Handicapped height (has BSC) Bathroom Accessibility: Yes How Accessible: Accessible via walker Home Care Services: Yes Type of Home Care Services: Home OT, Home PT, Home RN Home Care Agency (if known): [facility] Additional Comments: Pt lives with wife who works during the day. Wife takes him to HD before going to work and then picks him up during her lunch hour and takes him home.  Discharge Living Setting Plans for Discharge Living Setting: Patient's home Type of Home at Discharge: House Discharge Home Layout: One level Discharge Home Access: Stairs to enter Entrance Stairs-Rails: Can reach both Entrance Stairs-Number of Steps: 4 Discharge Bathroom Shower/Tub: Walk-in shower Discharge Bathroom Toilet: Handicapped height (has [facility]) Discharge Bathroom Accessibility: Yes How Accessible: Accessible via walker Does the patient have any problems obtaining your medications?: No  Social/Family/Support Systems Anticipated Caregiver: [place] Nies (wife), kids and in-laws Anticipated Caregiver's Contact Information: Robin: [phone] Caregiver Availability: 24/7 Discharge Plan Discussed with Primary Caregiver:  Yes Is Caregiver In Agreement with Plan?: Yes Does Caregiver/Family have Issues with Lodging/Transportation while Pt is in Rehab?: No  Goals Patient/Family Goal for Rehab: Mod I-Supervision: PT/OT/ST Expected length of stay: 10-12 days Pt/Family Agrees to Admission and willing to participate: Yes Program Orientation Provided & Reviewed with Pt/Caregiver Including Roles  & Responsibilities: Yes  Decrease burden of Care through IP rehab admission: NA  Possible need for SNF placement upon discharge: Not anticipated  Patient Condition: This patient's medical and functional status has changed since the consult dated: 11/16/24 in which the Rehabilitation Physician determined and documented  that the patient's condition is appropriate for intensive rehabilitative care in an inpatient rehabilitation facility. See History of Present Illness (above) for medical update. Functional changes are: min assist with mobility/ADLs. Patient's medical and functional status update has been discussed with the Rehabilitation physician and patient remains appropriate for inpatient rehabilitation. Will admit to inpatient rehab today.  Preadmission Screen Completed By:  Tinnie SHAUNNA Yvone Delayne, updates by Reche Lowers, PT, DPT 11/16/2024 3:32 PM ______________________________________________________________________   Discussed status with Dr. Babs on 11/23/2024  at 12:38 PM  and received approval for admission today.  Admission Coordinator:  Tinnie SHAUNNA Yvone Delayne, CCC-SLP, time 12:38 PM /Date 11/23/2024    Assessment/Plan: Diagnosis: debility, TBI Does the need for close, 24 hr/day Medical supervision in concert with the patient's rehab needs make it unreasonable for this patient to be served in a less intensive setting? Yes Co-Morbidities requiring supervision/potential complications: DKA, ESRD, ID considerations Due to bladder management, bowel management, safety, skin/wound care, disease management, medication administration, pain management, and patient education, does the patient require 24 hr/day rehab nursing? Yes Does the patient require coordinated care of a physician, rehab nurse, PT, OT, and SLP to address physical and functional deficits in the context of the above medical diagnosis(es)? Yes Addressing deficits in the following areas: balance, endurance, locomotion, strength, transferring, bowel/bladder control, bathing, dressing, feeding, grooming, toileting, cognition, and psychosocial support Can the patient actively participate in an intensive therapy program of at least 3 hrs of therapy 5 days a week? Yes The potential for patient to make measurable gains while on inpatient rehab is  excellent Anticipated functional outcomes upon discharge from inpatient rehab: modified independent and supervision PT, modified independent and supervision OT, modified independent and supervision SLP Estimated rehab length of stay to reach the above functional goals is: 10-12 days Anticipated discharge destination: Home 10. Overall Rehab/Functional Prognosis: excellent   MD Signature: Arthea IVAR Babs, MD, [facility] Physical Medicine & Rehabilitation Medical Director Rehabilitation Services 11/23/2024      [1]  Current Facility-Administered Medications:    acetaminophen  (TYLENOL ) tablet 650 mg, 650 mg, Oral, Q6H PRN, 650 mg at 11/14/24 1045 **OR** acetaminophen  (TYLENOL ) suppository 650 mg, 650 mg, Rectal, Q6H PRN, Cindy Garnette POUR, MD   albuterol  (PROVENTIL ) (2.5 MG/3ML) 0.083% nebulizer solution 2.5 mg, 2.5 mg, Nebulization, Q6H PRN, Cindy Garnette POUR, MD   calcitRIOL  (ROCALTROL ) capsule 0.25 mcg, 0.25 mcg, Oral, Daily, Cindy Garnette POUR, MD, 0.25 mcg at 11/16/24 1028   ceFEPIme  (MAXIPIME ) 1 g in sodium chloride  0.9 % 100 mL IVPB, 1 g, Intravenous, Q24H, Cindy Garnette POUR, MD, Last Rate: 200 mL/hr at 11/16/24 1033, 1 g at 11/16/24 1033   Chlorhexidine  Gluconate Cloth 2 % PADS 6 each, 6 each, Topical, Daily, Cindy Garnette POUR, MD, 6 each at 11/16/24 0731   Chlorhexidine  Gluconate Cloth 2 % PADS 6 each, 6 each, Topical, Q0600, Cindy Garnette POUR, MD, 6  each at 11/16/24 0616   Darbepoetin Alfa  (ARANESP ) injection 100 mcg, 100 mcg, Subcutaneous, Q Sat-1800, Cindy Garnette POUR, MD, 100 mcg at 11/14/24 1659   dextrose  50 % solution 0-50 mL, 0-50 mL, Intravenous, PRN, Cindy Garnette POUR, MD   insulin  aspart (novoLOG ) injection 0-6 Units, 0-6 Units, Subcutaneous, TID WC, Cindy Garnette POUR, MD, 3 Units at 11/16/24 1219   insulin  aspart (novoLOG ) injection 2 Units, 2 Units, Subcutaneous, TID WC, Cindy Garnette POUR, MD, 2 Units at 11/15/24 1716   [START ON 11/17/2024] insulin  glargine (LANTUS ) injection 15  Units, 15 Units, Subcutaneous, Daily, Cindy Garnette POUR, MD   levothyroxine  (SYNTHROID ) tablet 125 mcg, 125 mcg, Oral, q AM, Cindy Garnette POUR, MD, 125 mcg at 11/16/24 9343   ondansetron  (ZOFRAN ) tablet 4 mg, 4 mg, Oral, Q6H PRN **OR** ondansetron  (ZOFRAN ) injection 4 mg, 4 mg, Intravenous, Q6H PRN, Cindy Garnette POUR, MD   polyethylene glycol (MIRALAX  / GLYCOLAX ) packet 17 g, 17 g, Oral, BID, Cindy Garnette POUR, MD, 17 g at 11/16/24 1036   simvastatin  (ZOCOR ) tablet 20 mg, 20 mg, Oral, QPM, Cindy Garnette POUR, MD, 20 mg at 11/15/24 1716   sodium chloride  flush (NS) 0.9 % injection 10-40 mL, 10-40 mL, Intracatheter, Q12H, Cindy Garnette POUR, MD, 10 mL at 11/16/24 1033   sodium chloride  flush (NS) 0.9 % injection 10-40 mL, 10-40 mL, Intracatheter, PRN, Cindy Garnette POUR, MD   sodium chloride  flush (NS) 0.9 % injection 3 mL, 3 mL, Intravenous, Q12H, Cindy Garnette POUR, MD, 3 mL at 11/16/24 [id number]

## 2024-11-14 NOTE — Plan of Care (Signed)
" °  Problem: Education: Goal: Ability to describe self-care measures that may prevent or decrease complications (Diabetes Survival Skills Education) will improve Outcome: Progressing   Problem: Clinical Measurements: Goal: Ability to maintain clinical measurements within normal limits will improve Outcome: Progressing   Problem: Health Behavior/Discharge Planning: Goal: Ability to manage health-related needs will improve Outcome: Progressing   "

## 2024-11-14 NOTE — Progress Notes (Signed)
 Inpatient Rehab Admissions:  Inpatient Rehab Consult received.  I met with patient at the bedside for rehabilitation assessment and to discuss goals and expectations of an inpatient rehab admission.  Discussed average length of stay, insurance authorization requirement and discharge home after completion of CIR. Pt acknowledged understanding and is interested in pursuing CIR. Pt gave permission to contact wife to verify dispo. Attempted to contact pt's wife Grayce. No one answered and unable to leave a message. Will continue to follow.  Signed: Tinnie Yvone Cohens, MS, CCC-SLP Admissions Coordinator [phone]

## 2024-11-14 NOTE — Progress Notes (Signed)
 [facility] NEPHROLOGY PROGRESS NOTE  Assessment/ Plan: Pt is a 68 y.o. yo male with AKI on HD, HTN, T2DM, recent admit for head contusion s/p fall who is being admitted with fever, leukocytosis, and weakness due to sepsis.  Dialysis Orders:  MTuThF - [facility] 3hr, 400/A1.5, EDW 125kg, 3K/2.5Ca bath, TDC, heparin  5000 unit bolus - Mircera 150mcg IV q 2 weeks (100mcg given 12/5) - Calcitriol  0.5mcg PO q HD   # Sepsis due to MSSA and Proteus bacteremia: Exact source unknown but concern for TDC.   -TDC removed by IR on 12/26. -Currently on cefepime , getting TEE, ID is following.  # ESRD MTuThF - [facility]: Last HD on 12/26.  TDC was removed for line holiday.  Plan for next dialysis on Monday but patient will need a new line.  Please minimize fluid intake.  # Anemia: Continue Aranesp  and monitor hemoglobin.  # Secondary hyperparathyroidism: Phosphorus low, not on binders.  Resume calcitriol .  # HTN/volume: Monitor BP.  Please avoid IV fluid.  Subjective: Seen and examined at bedside.  HD catheter was removed.  Denies nausea, vomiting, chest pain or shortness of breath. Objective Vital signs in last 24 hours: Vitals:   11/13/24 1941 11/13/24 2302 11/14/24 0307 11/14/24 0738  BP: (!) 105/58 104/62 124/63 125/66  Pulse: 89 82 71 73  Resp: 20 20 20 13   Temp: 98.9 F (37.2 C) 98.7 F (37.1 C) 98.1 F (36.7 C) 97.8 F (36.6 C)  TempSrc: Oral Oral Oral Oral  SpO2: 98% 100% 100% 95%  Weight:       Weight change:   Intake/Output Summary (Last 24 hours) at 11/14/2024 1020 Last data filed at 11/14/2024 0417 Gross per 24 hour  Intake 495.73 ml  Output --  Net 495.73 ml       Labs: RENAL PANEL Recent Labs  Lab 11/11/24 1120 11/12/24 0307 11/12/24 0500 11/13/24 0426 11/14/24 0449  NA 128* 126* 124* 129* 127*  K 4.3 4.2 4.1 3.8 4.1  CL 92* 90* 90* 94* 92*  CO2 24 23 21* 22 24  GLUCOSE 380* 531* 495* 218* 262*  BUN 29* 41* 41* 29* 44*  CREATININE 3.82*  4.49* 4.54* 3.18* 4.54*  CALCIUM 8.1* 8.3* 8.3* 8.2* 8.1*  PHOS  --  4.1  --  2.4* 3.1  ALBUMIN  2.5* 2.5*  --  2.3* 2.2*    Liver Function Tests: Recent Labs  Lab 11/11/24 1120 11/12/24 0307 11/13/24 0426 11/14/24 0449  AST 17  --   --   --   ALT 10  --   --   --   ALKPHOS 116  --   --   --   BILITOT 1.2  --   --   --   PROT 6.1*  --   --   --   ALBUMIN  2.5* 2.5* 2.3* 2.2*   No results for input(s): LIPASE, AMYLASE in the last 168 hours. No results for input(s): AMMONIA in the last 168 hours. CBC: Recent Labs    03/10/24 1213 08/28/24 1613 09/17/24 0322 09/18/24 1541 11/04/24 0223 11/11/24 1120 11/12/24 0306 11/13/24 0729 11/14/24 0449  HGB 12.2*   < > 8.8*   < > 10.6* 9.7* 9.6* 10.5* 9.3*  MCV 83.9   < > 82.4   < > 90.1 84.2 83.0 81.8 81.4  VITAMINB12  --   --  443  --   --   --   --   --   --   FOLATE  --   --  5.3*  --   --   --   --   --   --   FERRITIN 91  --  40  --   --   --   --   --   --   TIBC 218*  --  190*  --   --   --   --   --   --   IRON  40*  --  39*  --   --   --   --   --   --   RETICCTPCT  --   --  2.3  --   --   --   --   --   --    < > = values in this interval not displayed.    Cardiac Enzymes: No results for input(s): CKTOTAL, CKMB, CKMBINDEX, TROPONINI in the last 168 hours. CBG: Recent Labs  Lab 11/13/24 1355 11/13/24 1856 11/13/24 2107 11/14/24 0602 11/14/24 0744  GLUCAP 236* 348* 310* 279* 287*    Iron  Studies: No results for input(s): IRON , TIBC, TRANSFERRIN, FERRITIN in the last 72 hours. Studies/Results: IR Removal Tun Cv Cath W/O FL Result Date: 11/13/2024 INDICATION: Patient receiving HD via R IJ tunneled HD catheter placed by IR 09/22/24 with request from team for line removal for line holiday. He underwent HD today prior to removal. EXAM: REMOVAL TUNNELED CENTRAL VENOUS CATHETER MEDICATIONS: No antibiotic was indicated for this procedure. 3 mL 1% lidocaine  was administered. ANESTHESIA/SEDATION:  Moderate (conscious) sedation was not employed during this procedure. FLUOROSCOPY TIME:  Fluoroscopy Time: none COMPLICATIONS: None immediate. PROCEDURE: Informed written consent was obtained from the patient after a thorough discussion of the procedural risks, benefits and alternatives. All questions were addressed. Maximal Sterile Barrier Technique was utilized including caps, mask, sterile gowns, sterile gloves, sterile drape, hand hygiene and skin antiseptic. A timeout was performed prior to the initiation of the procedure. The patient's RIGHT chest and catheter was prepped and draped in a normal sterile fashion. Heparin  was removed from both ports of catheter. 1% lidocaine  was used for local anesthesia. Using gentle blunt dissection the cuff of the catheter was exposed and the catheter was removed in it's entirety. Pressure was held till hemostasis was obtained. A sterile dressing was applied. The patient tolerated the procedure well with no immediate complications. IMPRESSION: Successful removal of a RIGHT chest tunneled dialysis catheter. Performed by Laymon Coast, NP under the supervision of Dr. [name]. Electronically Signed   By: Thom [name] M.D.   On: 11/13/2024 18:23   CT HEAD WO CONTRAST ( ) Result Date: 11/13/2024 EXAM: CT HEAD WITHOUT CONTRAST 11/13/2024 06:12:00 AM TECHNIQUE: CT of the head was performed without the administration of intravenous contrast. Automated exposure control, iterative reconstruction, and/or weight based adjustment of the mA/kV was utilized to reduce the radiation dose to as low as reasonably achievable. COMPARISON: CT head 11/04/2024 and MRI 11/03/2024. CLINICAL HISTORY: 68 year old male with a history of moderate-severe head trauma, found down earlier this month with posttraumatic intracranial hemorrhage. FINDINGS: Mild motion artifact today. BRAIN AND VENTRICLES: Regressed but not completely resolved inferior frontal gyrus hemorrhagic contusions, still visible  on series 2 imaging 8. No other intracranial hemorrhage identified. Small bilateral low density subdural fluid collections now (coronal image 50 on the left, 4 mm and coronal image 46 on the right, 3 mm). These appear new or increased since the MRI on 11/03/2024. No hyperdense subdural blood products identified. Trace leftward midline shift on coronal image 35. Lobulated right anterior convexity  hyperdense meningioma redemonstrated and stable on series 8 image 25, approximately 37 mm long axis. Stable underlying mild regional mass effect. No associated cerebral edema there. Unchanged subcentimeter dystrophic appearing calcification in the pons (series 2 image 10). Stable gray white differentiation. No evidence of acute infarct. No hydrocephalus. No suspicious intracranial vascular hyperdensity. ORBITS: No acute abnormality. SINUSES: Improved paranasal sinuses aeration. Tympanic cavities and mastoids remain well aerated. SOFT TISSUES AND SKULL: Regressed left forehead scalp hematoma. Regressed separate posterior vertex scalp hematoma. No scalp soft tissue gas. No skull fracture. IMPRESSION: 1. New small bilateral low-density subdural collections (4mm on the left , 3mm on the right). These could be posttraumatic CSF hygromas or low-density subdural Hematomas in this setting. Trace leftward midline shift is associated. 2. Regressed but not fully resolved inferior frontal gyrus hemorrhagic contusions. 3. Stable right anterior convexity Meningioma (37 mm). Electronically signed by: Helayne Hurst MD 11/13/2024 06:36 AM EST RP Workstation: [id number]   ECHOCARDIOGRAM COMPLETE BUBBLE STUDY Result Date: 11/12/2024    ECHOCARDIOGRAM REPORT   Patient Name:   Aster Eckrich. Date of Exam: 11/12/2024 Medical Rec #:  [id number]       Height:       74.0 in Accession #:    [id number]      Weight:       283.5 lb Date of Birth:  1956-10-11       BSA:          2.521 m Patient Age:    89 years        BP:           129/62 mmHg Patient  Gender: M               HR:           66 bpm. Exam Location:  Inpatient Procedure: 2D Echo, Cardiac Doppler and Intracardiac Opacification Agent (Both            Spectral and Color Flow Doppler were utilized during procedure). Indications:    Endocarditis  History:        Patient has prior history of Echocardiogram examinations, most                 recent 05/07/2024. Risk Factors:Dyslipidemia, Diabetes and                 Hypertension.  Sonographer:    Merlynn Argyle Referring Phys: [id number] [facility]  Sonographer Comments: Image acquisition challenging due to patient body habitus. IMPRESSIONS  1. Left ventricular ejection fraction, by estimation, is 55 to 60%. The left ventricle has normal function. The left ventricle has no regional wall motion abnormalities. The left ventricular internal cavity size was moderately dilated. Left ventricular diastolic parameters are consistent with Grade I diastolic dysfunction (impaired relaxation).  2. Right ventricular systolic function is normal. The right ventricular size is normal.  3. The mitral valve is normal in structure. No evidence of mitral valve regurgitation. No evidence of mitral stenosis.  4. The aortic valve is tricuspid. There is mild calcification of the aortic valve. Aortic valve regurgitation is not visualized. No aortic stenosis is present.  5. The inferior vena cava is dilated in size with >50% respiratory variability, suggesting right atrial pressure of 8 mmHg. FINDINGS  Left Ventricle: Left ventricular ejection fraction, by estimation, is 55 to 60%. The left ventricle has normal function. The left ventricle has no regional wall motion abnormalities. Definity  contrast agent was given IV to delineate the left ventricular  endocardial  borders. The left ventricular internal cavity size was moderately dilated. There is no left ventricular hypertrophy. Left ventricular diastolic parameters are consistent with Grade I diastolic dysfunction (impaired relaxation).  Right Ventricle: The right ventricular size is normal. No increase in right ventricular wall thickness. Right ventricular systolic function is normal. Left Atrium: Left atrial size was normal in size. Right Atrium: Right atrial size was normal in size. Pericardium: There is no evidence of pericardial effusion. Mitral Valve: The mitral valve is normal in structure. No evidence of mitral valve regurgitation. No evidence of mitral valve stenosis. Tricuspid Valve: The tricuspid valve is normal in structure. Tricuspid valve regurgitation is trivial. No evidence of tricuspid stenosis. Aortic Valve: The aortic valve is tricuspid. There is mild calcification of the aortic valve. Aortic valve regurgitation is not visualized. No aortic stenosis is present. Pulmonic Valve: The pulmonic valve was normal in structure. Pulmonic valve regurgitation is not visualized. No evidence of pulmonic stenosis. Aorta: The aortic root is normal in size and structure. Venous: The inferior vena cava is dilated in size with greater than 50% respiratory variability, suggesting right atrial pressure of 8 mmHg. IAS/Shunts: No atrial level shunt detected by color flow Doppler.  LEFT VENTRICLE PLAX 2D LVIDd:         6.60 cm   Diastology LVIDs:         5.50 cm   LV e' medial:    4.57 cm/s LV PW:         1.10 cm   LV E/e' medial:  16.4 LV IVS:        0.90 cm   LV e' lateral:   6.20 cm/s LVOT diam:     1.80 cm   LV E/e' lateral: 12.1 LV SV:         53 LV SV Index:   21 LVOT Area:     2.54 cm  RIGHT VENTRICLE             IVC RV Basal diam:  3.60 cm     IVC diam: 2.40 cm RV S prime:     11.30 cm/s TAPSE (M-mode): 2.1 cm LEFT ATRIUM             Index        RIGHT ATRIUM           Index LA diam:        4.40 cm 1.75 cm/m   RA Area:     19.00 cm LA Vol (A2C):   79.8 ml 31.66 ml/m  RA Volume:   49.20 ml  19.52 ml/m LA Vol (A4C):   59.8 ml 23.72 ml/m LA Biplane Vol: 69.4 ml 27.53 ml/m  AORTIC VALVE LVOT Vmax:   95.00 cm/s LVOT Vmean:  62.000 cm/s LVOT  VTI:    0.208 m  AORTA Ao Root diam: 3.10 cm Ao Asc diam:  3.20 cm MITRAL VALVE MV Area (PHT): 1.85 cm    SHUNTS MV Decel Time: 410 msec    Systemic VTI:  0.21 m MV E velocity: 74.90 cm/s  Systemic Diam: 1.80 cm MV A velocity: 85.90 cm/s MV E/A ratio:  0.87 Toribio Fuel MD Electronically signed by Toribio Fuel MD Signature Date/Time: 11/12/2024/1:05:03 PM    Final     Medications: Infusions:  ceFEPime  (MAXIPIME ) IV Stopped (11/13/24 1021)    Scheduled Medications:  calcitRIOL   0.25 mcg Oral Daily   Chlorhexidine  Gluconate Cloth  6 each Topical Daily   Chlorhexidine  Gluconate Cloth  6 each Topical Q0600  insulin  aspart  0-6 Units Subcutaneous TID WC   insulin  aspart  2 Units Subcutaneous TID WC   insulin  glargine  9 Units Subcutaneous Daily   levothyroxine   125 mcg Oral q AM   simvastatin   20 mg Oral QPM   sodium chloride  flush  10-40 mL Intracatheter Q12H   sodium chloride  flush  3 mL Intravenous Q12H    have reviewed scheduled and prn medications.  Physical Exam: General:NAD, comfortable Heart:RRR, s1s2 nl Lungs:clear b/l, no crackle Abdomen:soft, Non-tender,  Extremities: Trace peripheral edema Dialysis Access: HD catheter removed.  Lacy Sofia Prasad Myndi Wamble 11/14/2024,10:20 AM  LOS: 3 days

## 2024-11-15 ENCOUNTER — Other Ambulatory Visit (HOSPITAL_COMMUNITY): Payer: Self-pay

## 2024-11-15 DIAGNOSIS — R509 Fever, unspecified: Secondary | ICD-10-CM | POA: Diagnosis not present

## 2024-11-15 LAB — CBC WITH DIFFERENTIAL/PLATELET
Abs Immature Granulocytes: 0.46 K/uL — ABNORMAL HIGH (ref 0.00–0.07)
Basophils Absolute: 0.1 K/uL (ref 0.0–0.1)
Basophils Relative: 0 %
Eosinophils Absolute: 0.2 K/uL (ref 0.0–0.5)
Eosinophils Relative: 1 %
HCT: 27.2 % — ABNORMAL LOW (ref 39.0–52.0)
Hemoglobin: 9.5 g/dL — ABNORMAL LOW (ref 13.0–17.0)
Immature Granulocytes: 2 %
Lymphocytes Relative: 7 %
Lymphs Abs: 1.5 K/uL (ref 0.7–4.0)
MCH: 28.6 pg (ref 26.0–34.0)
MCHC: 34.9 g/dL (ref 30.0–36.0)
MCV: 81.9 fL (ref 80.0–100.0)
Monocytes Absolute: 2 K/uL — ABNORMAL HIGH (ref 0.1–1.0)
Monocytes Relative: 9 %
Neutro Abs: 18.3 K/uL — ABNORMAL HIGH (ref 1.7–7.7)
Neutrophils Relative %: 81 %
Platelets: UNDETERMINED K/uL (ref 150–400)
RBC: 3.32 MIL/uL — ABNORMAL LOW (ref 4.22–5.81)
RDW: 16.7 % — ABNORMAL HIGH (ref 11.5–15.5)
WBC: 22.4 K/uL — ABNORMAL HIGH (ref 4.0–10.5)
nRBC: 0 % (ref 0.0–0.2)

## 2024-11-15 LAB — RENAL FUNCTION PANEL
Albumin: 2.3 g/dL — ABNORMAL LOW (ref 3.5–5.0)
Anion gap: 10 (ref 5–15)
BUN: 54 mg/dL — ABNORMAL HIGH (ref 8–23)
CO2: 25 mmol/L (ref 22–32)
Calcium: 8.1 mg/dL — ABNORMAL LOW (ref 8.9–10.3)
Chloride: 91 mmol/L — ABNORMAL LOW (ref 98–111)
Creatinine, Ser: 5.3 mg/dL — ABNORMAL HIGH (ref 0.61–1.24)
GFR, Estimated: 11 mL/min — ABNORMAL LOW
Glucose, Bld: 298 mg/dL — ABNORMAL HIGH (ref 70–99)
Phosphorus: 3.1 mg/dL (ref 2.5–4.6)
Potassium: 4.4 mmol/L (ref 3.5–5.1)
Sodium: 127 mmol/L — ABNORMAL LOW (ref 135–145)

## 2024-11-15 LAB — CULTURE, BLOOD (ROUTINE X 2)

## 2024-11-15 LAB — GLUCOSE, CAPILLARY
Glucose-Capillary: 216 mg/dL — ABNORMAL HIGH (ref 70–99)
Glucose-Capillary: 289 mg/dL — ABNORMAL HIGH (ref 70–99)
Glucose-Capillary: 311 mg/dL — ABNORMAL HIGH (ref 70–99)
Glucose-Capillary: 296 mg/dL — ABNORMAL HIGH (ref 70–99)

## 2024-11-15 MED ORDER — SODIUM CHLORIDE 0.9 % IV SOLN: INTRAVENOUS | Status: DC

## 2024-11-15 MED ORDER — POLYETHYLENE GLYCOL 3350 17 G PO PACK
17.0000 g | PACK | Freq: Two times a day (BID) | ORAL | Status: DC
  Administered 2024-11-15 – 2024-11-22 (×9): 17 g via ORAL
  Filled 2024-11-15 (×2): qty 1

## 2024-11-15 NOTE — Plan of Care (Signed)

## 2024-11-15 NOTE — Progress Notes (Signed)
 Inpatient Rehab Admissions Coordinator:  Spoke with pt's wife Grayce on the phone. Discussed CIR goals and expectations. She acknowledged understanding and is supportive of pt pursuing CIR. Grayce confirmed that family will be able to provide 24/7 support for pt after discharge. Will continue to follow.   Tinnie Yvone Cohens, MS, CCC-SLP Admissions Coordinator [phone]

## 2024-11-15 NOTE — Anesthesia Preprocedure Evaluation (Signed)
"                                    Anesthesia Evaluation  Patient identified by MRN, date of birth, ID band Patient awake    Reviewed: Allergy & Precautions, Patient's Chart, lab work & pertinent test results, reviewed documented beta blocker date and time   Airway Mallampati: II  TM Distance: >3 FB Neck ROM: Full    Dental no notable dental hx.    Pulmonary sleep apnea    Pulmonary exam normal        Cardiovascular hypertension, Pt. on medications and Pt. on home beta blockers +CHF   Rhythm:Regular Rate:Normal  TTE 2025 1. Left ventricular ejection fraction, by estimation, is 60 to 65%. The  left ventricle has normal function. The left ventricle has no regional  wall motion abnormalities. There is mild concentric left ventricular  hypertrophy. Left ventricular diastolic  parameters are consistent with Grade I diastolic dysfunction (impaired  relaxation).   2. Right ventricular systolic function is normal. The right ventricular  size is normal.   3. Left atrial size was mildly dilated.   4. Right atrial size was mildly dilated.   5. The mitral valve is normal in structure. Trivial mitral valve  regurgitation. No evidence of mitral stenosis.   6. The aortic valve is tricuspid. There is mild calcification of the  aortic valve. Aortic valve regurgitation is not visualized. Aortic valve  sclerosis/calcification is present, without any evidence of aortic  stenosis.   7. The inferior vena cava is normal in size with greater than 50%  respiratory variability, suggesting right atrial pressure of 3 mmHg.     Neuro/Psych negative neurological ROS  negative psych ROS   GI/Hepatic negative GI ROS, Neg liver ROS,,,  Endo/Other  diabetes, Type 2, Insulin  DependentHypothyroidism    Renal/GU Dialysis and ESRFRenal disease  negative genitourinary   Musculoskeletal negative musculoskeletal ROS (+)    Abdominal Normal abdominal exam  (+)   Peds  Hematology  (+)  Blood dyscrasia, anemia   Anesthesia Other Findings Sepsis secondary to MSSA and proteus mirabilis bacteremia related to HD line  Reproductive/Obstetrics                              Anesthesia Physical Anesthesia Plan  ASA: 3  Anesthesia Plan: MAC   Post-op Pain Management:    Induction: Intravenous  PONV Risk Score and Plan: Propofol  infusion and Treatment may vary due to age or medical condition  Airway Management Planned: Simple Face Mask and Nasal Cannula  Additional Equipment: None  Intra-op Plan:   Post-operative Plan:   Informed Consent: I have reviewed the patients History and Physical, chart, labs and discussed the procedure including the risks, benefits and alternatives for the proposed anesthesia with the patient or authorized representative who has indicated his/her understanding and acceptance.     Dental advisory given  Plan Discussed with: CRNA  Anesthesia Plan Comments:          Anesthesia Quick Evaluation  "

## 2024-11-15 NOTE — Progress Notes (Signed)
 " Progress Note   Patient: Brendan [name]. FMW:[id number] DOB: [birth year] DOA: 11/11/2024     4 DOS: the patient was seen and examined on 11/15/2024   Brief hospital course: 68 year old history of Fournier's gangrene 08/2024 status post I&D drainage wound 08/30/2024 Dr. Salley was felt secondary to use of Farxiga -cultures grew strep constellatus MSSA Prevotella + beta-lactamase positive and patient was placed on Augmentin  at time of discharge EOT 09/13/2024-discharge weight 301 10/28 readmitted SOB-AKI worsening 18 pound weight gain-cardiology nephrology consulted 2/2 NYHA IV symptoms--started on IHD status post tunneled cath 09/22/2024 12/16 readmission obtunded on the floor WBC 17,000 CT contusion with TBI?  Meningioma-Rx Keppra  and was not discharged on the same as neurology did not feel this was a necessity at that time--- was discharged 12/19   Chr med h/o--OSA HTN IDDM hypothyroid HFpEF gout   12/24 came back to the emergency room feeling weak blood pressure low has been on 4 times a week HD had a fever-CBG noted to be 380 lactic acid 1.4 Tmax 101 WBC 21.6 blood pressure 90/40 nephrology consulted-crystalloid bolus fluids given Vanco Flagyl  cefepime  added 12/25 early a.m. seem to be in DKA transferred to progressive unit 2/2 concern for DKA CT head cystic hygromas 4 mm right 3 mm left trace leftward midline shift regressed not resolved inferior frontal gyrus hemorrhagic contusion--37 mm stable right meningioma 12/26 underwent HD and HD cath removed by IR  Assessment and Plan:   Sepsis secondary to MSSA and proteus mirabilis bacteremia related to HD line ID following. Continue cefepime  HD cath removed by IR on 12/26 TEE is planned for 12/29   DM with hyperglycemia Currently on lantus  9u with SSI   Immunosuppression secondary to psoriasis follows with Dr. Dianah (dermatology) for the past 6 years Has not been able to obtain Bimzelx yet hold clobetasol  at this time   Newly  declared ESRD-dialyzing via right tunneled cath placed 09/22/2024 Dialyzed 12/26 HD cath removed giving line holiday Nephrology following   HFpEF NYHA IV Will not recommend SGLT2 in the future given groin wounds Amlodipine  5 held Coreg  12.5 twice daily held, torsemide  100 MT ThF held   Hypothyroidism Continue Synthroid  125   Recent TBI brain managed nonsurgically during hospitalization 12/16-12/9. 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 Victory Boers 12/26 neurosurgery --absence of mental status changes focal deficit --nothing surgically specific to do in this case   Scrotal wound in the setting of Fournier's gangrene as above Last seen [facility] surgery 10/13/2024 WOC following   BMI 36 Recommend diet/lifestyle modification      Subjective: Without complaints this AM  Physical Exam: Vitals:   11/15/24 0622 11/15/24 0709 11/15/24 1108 11/15/24 1622  BP:  127/64 (!) 140/70 118/66  Pulse:  69 75 75  Resp:  14 13 12   Temp:  97.9 F (36.6 C) 98.2 F (36.8 C) 98 F (36.7 C)  TempSrc:  Oral Oral Oral  SpO2:  96% 93% 99%  Weight: 127.6 kg      General exam: Awake, laying in bed, in nad Respiratory system: Normal respiratory effort, no wheezing Cardiovascular system: regular rate, s1, s2 Gastrointestinal system: Soft, nondistended, positive BS Central nervous system: CN2-12 grossly intact, strength intact Extremities: Perfused, no clubbing Skin: Normal skin turgor, no notable skin lesions seen Psychiatry: Mood normal // no visual hallucinations   Data Reviewed:  Labs reviewed: Na 127, K 4.4, WBC  22.4, Hgb 9.5  Family Communication: Pt in room, family at bedside  Disposition: Status is: Inpatient Remains inpatient appropriate because: severity of illness  Planned Discharge Destination: Rehab    Author: Garnette Pelt, MD 11/15/2024 5:25 PM  For on call  review [email or web address].  "

## 2024-11-15 NOTE — Hospital Course (Signed)
 68 year old history of Fournier's gangrene 08/2024 status post I&D drainage wound 08/30/2024 Dr. Salley was felt secondary to use of Farxiga -cultures grew strep constellatus MSSA Prevotella + beta-lactamase positive and patient was placed on Augmentin  at time of discharge EOT 09/13/2024-discharge weight 301 10/28 readmitted SOB-AKI worsening 18 pound weight gain-cardiology nephrology consulted 2/2 NYHA IV symptoms--started on IHD status post tunneled cath 09/22/2024 12/16 readmission obtunded on the floor WBC 17,000 CT contusion with TBI?  Meningioma-Rx Keppra  and was not discharged on the same as neurology did not feel this was a necessity at that time--- was discharged 12/19   Chr med h/o--OSA HTN IDDM hypothyroid HFpEF gout   12/24 came back to the emergency room feeling weak blood pressure low has been on 4 times a week HD had a fever-CBG noted to be 380 lactic acid 1.4 Tmax 101 WBC 21.6 blood pressure 90/40 nephrology consulted-crystalloid bolus fluids given Vanco Flagyl  cefepime  added 12/25 early a.m. seem to be in DKA transferred to progressive unit 2/2 concern for DKA CT head cystic hygromas 4 mm right 3 mm left trace leftward midline shift regressed not resolved inferior frontal gyrus hemorrhagic contusion--37 mm stable right meningioma 12/26 underwent HD and HD cath removed by IR

## 2024-11-15 NOTE — Progress Notes (Signed)
 [facility] NEPHROLOGY PROGRESS NOTE  Assessment/ Plan: Pt is a 68 y.o. yo male with AKI on HD, HTN, T2DM, recent admit for head contusion s/p fall who is being admitted with fever, leukocytosis, and weakness due to sepsis.  Dialysis Orders:  [facility]F - [facility] 3hr, 400/A1.5, EDW 125kg, 3K/2.5Ca bath, TDC, heparin  5000 unit bolus - Mircera 150mcg IV q 2 weeks (100mcg given 12/5) - Calcitriol  0.5mcg PO q HD   # Sepsis due to MSSA and Proteus bacteremia: Exact source unknown but concern for TDC.   -TDC removed by IR on 12/26. -Currently on cefepime , getting TEE tomorrow, ID is following.  # ESRD [facility]uThF - [facility]: Last HD on 12/26.  TDC was removed for line holiday.  Per ID 72 hours of line holiday therefore we can plan dialysis on Tuesday.  Patient will need HD line, will need to discuss with ID for temp vs perm cath.   # Anemia: Continue Aranesp  and monitor hemoglobin.  # Secondary hyperparathyroidism: Phosphorus low, not on binders.  Resume calcitriol .  # HTN/volume: Monitor BP.  Please avoid IV fluid.  Subjective: Seen and examined at bedside.  Denies nausea, vomiting, chest pain, shortness of breath.  No new event overnight.  Plan for TEE tomorrow.   Objective Vital signs in last 24 hours: Vitals:   11/15/24 0357 11/15/24 0622 11/15/24 0709 11/15/24 1108  BP: 129/67  127/64 (!) 140/70  Pulse: 68  69 75  Resp: 12  14 13   Temp: 97.6 F (36.4 C)  97.9 F (36.6 C) 98.2 F (36.8 C)  TempSrc: Oral  Oral Oral  SpO2: 98%  96% 93%  Weight:  127.6 kg     Weight change:   Intake/Output Summary (Last 24 hours) at 11/15/2024 1147 Last data filed at 11/14/2024 1923 Gross per 24 hour  Intake 360 ml  Output 200 ml  Net 160 ml       Labs: RENAL PANEL Recent Labs  Lab 11/11/24 1120 11/12/24 0307 11/12/24 0500 11/13/24 0426 11/14/24 0449 11/15/24 0326  NA 128* 126* 124* 129* 127* 127*  K 4.3 4.2 4.1 3.8 4.1 4.4  CL 92* 90* 90* 94* 92* 91*  CO2 24 23 21*  22 24 25   GLUCOSE 380* 531* 495* 218* 262* 298*  BUN 29* 41* 41* 29* 44* 54*  CREATININE 3.82* 4.49* 4.54* 3.18* 4.54* 5.30*  CALCIUM 8.1* 8.3* 8.3* 8.2* 8.1* 8.1*  PHOS  --  4.1  --  2.4* 3.1 3.1  ALBUMIN  2.5* 2.5*  --  2.3* 2.2* 2.3*    Liver Function Tests: Recent Labs  Lab 11/11/24 1120 11/12/24 0307 11/13/24 0426 11/14/24 0449 11/15/24 0326  AST 17  --   --   --   --   ALT 10  --   --   --   --   ALKPHOS 116  --   --   --   --   BILITOT 1.2  --   --   --   --   PROT 6.1*  --   --   --   --   ALBUMIN  2.5*   < > 2.3* 2.2* 2.3*   < > = values in this interval not displayed.   No results for input(s): LIPASE, AMYLASE in the last 168 hours. No results for input(s): AMMONIA in the last 168 hours. CBC: Recent Labs    03/10/24 1213 08/28/24 1613 09/17/24 0322 09/18/24 1541 11/11/24 1120 11/12/24 0306 11/13/24 0729 11/14/24 0449 11/15/24  0326  HGB 12.2*   < > 8.8*   < > 9.7* 9.6* 10.5* 9.3* 9.5*  MCV 83.9   < > 82.4   < > 84.2 83.0 81.8 81.4 81.9  VITAMINB12  --   --  443  --   --   --   --   --   --   FOLATE  --   --  5.3*  --   --   --   --   --   --   FERRITIN 91  --  40  --   --   --   --   --   --   TIBC 218*  --  190*  --   --   --   --   --   --   IRON  40*  --  39*  --   --   --   --   --   --   RETICCTPCT  --   --  2.3  --   --   --   --   --   --    < > = values in this interval not displayed.    Cardiac Enzymes: No results for input(s): CKTOTAL, CKMB, CKMBINDEX, TROPONINI in the last 168 hours. CBG: Recent Labs  Lab 11/14/24 1135 11/14/24 1636 11/14/24 2135 11/15/24 0610 11/15/24 1108  GLUCAP 279* 356* 285* 289* 311*    Iron  Studies: No results for input(s): IRON , TIBC, TRANSFERRIN, FERRITIN in the last 72 hours. Studies/Results: IR Removal Tun Cv Cath W/O FL Result Date: 11/13/2024 INDICATION: Patient receiving HD via R IJ tunneled HD catheter placed by IR 09/22/24 with request from team for line removal for line holiday.  He underwent HD today prior to removal. EXAM: REMOVAL TUNNELED CENTRAL VENOUS CATHETER MEDICATIONS: No antibiotic was indicated for this procedure. 3 mL 1% lidocaine  was administered. ANESTHESIA/SEDATION: Moderate (conscious) sedation was not employed during this procedure. FLUOROSCOPY TIME:  Fluoroscopy Time: none COMPLICATIONS: None immediate. PROCEDURE: Informed written consent was obtained from the patient after a thorough discussion of the procedural risks, benefits and alternatives. All questions were addressed. Maximal Sterile Barrier Technique was utilized including caps, mask, sterile gowns, sterile gloves, sterile drape, hand hygiene and skin antiseptic. A timeout was performed prior to the initiation of the procedure. The patient's RIGHT chest and catheter was prepped and draped in a normal sterile fashion. Heparin  was removed from both ports of catheter. 1% lidocaine  was used for local anesthesia. Using gentle blunt dissection the cuff of the catheter was exposed and the catheter was removed in it's entirety. Pressure was held till hemostasis was obtained. A sterile dressing was applied. The patient tolerated the procedure well with no immediate complications. IMPRESSION: Successful removal of a RIGHT chest tunneled dialysis catheter. Performed by Laymon Coast, NP under the supervision of Dr. [name]. Electronically Signed   By: Thom [name] M.D.   On: 11/13/2024 18:23    Medications: Infusions:  ceFEPime  (MAXIPIME ) IV 1 g (11/15/24 0823)    Scheduled Medications:  calcitRIOL   0.25 mcg Oral Daily   Chlorhexidine  Gluconate Cloth  6 each Topical Daily   Chlorhexidine  Gluconate Cloth  6 each Topical Q0600   darbepoetin (ARANESP ) injection - DIALYSIS  100 mcg Subcutaneous Q Sat-1800   insulin  aspart  0-6 Units Subcutaneous TID WC   insulin  aspart  2 Units Subcutaneous TID WC   insulin  glargine  9 Units Subcutaneous Daily   levothyroxine   125 mcg Oral q AM  polyethylene glycol  17 g  Oral BID   simvastatin   20 mg Oral QPM   sodium chloride  flush  10-40 mL Intracatheter Q12H   sodium chloride  flush  3 mL Intravenous Q12H    have reviewed scheduled and prn medications.  Physical Exam: General:NAD, comfortable Heart:RRR, s1s2 nl Lungs:clear b/l, no crackle Abdomen:soft, Non-tender,  Extremities: Trace peripheral edema Dialysis Access: HD catheter removed.  Kimra Kantor Prasad Heidie Krall 11/15/2024,11:47 AM  LOS: 4 days

## 2024-11-16 ENCOUNTER — Encounter (HOSPITAL_COMMUNITY): Payer: Self-pay | Admitting: Anesthesiology

## 2024-11-16 ENCOUNTER — Inpatient Hospital Stay (HOSPITAL_COMMUNITY)

## 2024-11-16 ENCOUNTER — Encounter (HOSPITAL_COMMUNITY): Payer: Self-pay | Admitting: Internal Medicine

## 2024-11-16 DIAGNOSIS — Z87820 Personal history of traumatic brain injury: Secondary | ICD-10-CM | POA: Diagnosis not present

## 2024-11-16 DIAGNOSIS — R5381 Other malaise: Secondary | ICD-10-CM | POA: Diagnosis not present

## 2024-11-16 DIAGNOSIS — T148XXA Other injury of unspecified body region, initial encounter: Secondary | ICD-10-CM | POA: Diagnosis not present

## 2024-11-16 DIAGNOSIS — G4733 Obstructive sleep apnea (adult) (pediatric): Secondary | ICD-10-CM | POA: Diagnosis not present

## 2024-11-16 DIAGNOSIS — E039 Hypothyroidism, unspecified: Secondary | ICD-10-CM | POA: Diagnosis not present

## 2024-11-16 DIAGNOSIS — E119 Type 2 diabetes mellitus without complications: Secondary | ICD-10-CM | POA: Diagnosis not present

## 2024-11-16 DIAGNOSIS — Z992 Dependence on renal dialysis: Secondary | ICD-10-CM | POA: Diagnosis not present

## 2024-11-16 DIAGNOSIS — R509 Fever, unspecified: Secondary | ICD-10-CM | POA: Diagnosis not present

## 2024-11-16 DIAGNOSIS — I5032 Chronic diastolic (congestive) heart failure: Secondary | ICD-10-CM | POA: Diagnosis not present

## 2024-11-16 DIAGNOSIS — N186 End stage renal disease: Secondary | ICD-10-CM | POA: Diagnosis not present

## 2024-11-16 DIAGNOSIS — Z794 Long term (current) use of insulin: Secondary | ICD-10-CM | POA: Diagnosis not present

## 2024-11-16 LAB — GLUCOSE, CAPILLARY
Glucose-Capillary: 278 mg/dL — ABNORMAL HIGH (ref 70–99)
Glucose-Capillary: 294 mg/dL — ABNORMAL HIGH (ref 70–99)
Glucose-Capillary: 312 mg/dL — ABNORMAL HIGH (ref 70–99)
Glucose-Capillary: 313 mg/dL — ABNORMAL HIGH (ref 70–99)
Glucose-Capillary: 322 mg/dL — ABNORMAL HIGH (ref 70–99)
Glucose-Capillary: 343 mg/dL — ABNORMAL HIGH (ref 70–99)

## 2024-11-16 LAB — CBC
HCT: 27.7 % — ABNORMAL LOW (ref 39.0–52.0)
Hemoglobin: 9.6 g/dL — ABNORMAL LOW (ref 13.0–17.0)
MCH: 28.1 pg (ref 26.0–34.0)
MCHC: 34.7 g/dL (ref 30.0–36.0)
MCV: 81 fL (ref 80.0–100.0)
Platelets: 67 K/uL — ABNORMAL LOW (ref 150–400)
RBC: 3.42 MIL/uL — ABNORMAL LOW (ref 4.22–5.81)
RDW: 16.2 % — ABNORMAL HIGH (ref 11.5–15.5)
WBC: 19.5 K/uL — ABNORMAL HIGH (ref 4.0–10.5)
nRBC: 0 % (ref 0.0–0.2)

## 2024-11-16 LAB — COMPREHENSIVE METABOLIC PANEL WITH GFR
ALT: 6 U/L (ref 0–44)
AST: <10 U/L — ABNORMAL LOW (ref 15–41)
Albumin: 2.3 g/dL — ABNORMAL LOW (ref 3.5–5.0)
Alkaline Phosphatase: 131 U/L — ABNORMAL HIGH (ref 38–126)
Anion gap: 12 (ref 5–15)
BUN: 59 mg/dL — ABNORMAL HIGH (ref 8–23)
CO2: 23 mmol/L (ref 22–32)
Calcium: 8.3 mg/dL — ABNORMAL LOW (ref 8.9–10.3)
Chloride: 91 mmol/L — ABNORMAL LOW (ref 98–111)
Creatinine, Ser: 5.71 mg/dL — ABNORMAL HIGH (ref 0.61–1.24)
GFR, Estimated: 10 mL/min — ABNORMAL LOW
Glucose, Bld: 328 mg/dL — ABNORMAL HIGH (ref 70–99)
Potassium: 4.3 mmol/L (ref 3.5–5.1)
Sodium: 126 mmol/L — ABNORMAL LOW (ref 135–145)
Total Bilirubin: 0.6 mg/dL (ref 0.0–1.2)
Total Protein: 6.1 g/dL — ABNORMAL LOW (ref 6.5–8.1)

## 2024-11-16 LAB — CULTURE, BLOOD (ROUTINE X 2)
Culture: NO GROWTH
Special Requests: ADEQUATE

## 2024-11-16 MED ORDER — INSULIN GLARGINE 100 UNIT/ML ~~LOC~~ SOLN
15.0000 [IU] | Freq: Every day | SUBCUTANEOUS | Status: DC
  Administered 2024-11-18 – 2024-11-20 (×3): 15 [IU] via SUBCUTANEOUS
  Filled 2024-11-16 (×4): qty 0.15

## 2024-11-16 MED ORDER — CHLORHEXIDINE GLUCONATE CLOTH 2 % EX PADS
6.0000 | MEDICATED_PAD | Freq: Every day | CUTANEOUS | Status: DC
  Administered 2024-11-17: 6 via TOPICAL

## 2024-11-16 MED ORDER — INSULIN ASPART 100 UNIT/ML IJ SOLN
4.0000 [IU] | Freq: Once | INTRAMUSCULAR | Status: AC
  Administered 2024-11-16: 4 [IU] via SUBCUTANEOUS
  Filled 2024-11-16: qty 1

## 2024-11-16 NOTE — Progress Notes (Signed)
 Attempted to call to give report to 2W RN for patient transfer with no answer. Will try again.

## 2024-11-16 NOTE — Progress Notes (Signed)
 " Progress Note   Patient: Brendan [name]. FMW:[id number] DOB: 1956-10-29 DOA: 11/11/2024     5 DOS: the patient was seen and examined on 11/16/2024   Brief hospital course: 68 year old history of Fournier's gangrene 08/2024 status post I&D drainage wound 08/30/2024 Dr. Salley was felt secondary to use of Farxiga -cultures grew strep constellatus MSSA Prevotella + beta-lactamase positive and patient was placed on Augmentin  at time of discharge EOT 09/13/2024-discharge weight 301 10/28 readmitted SOB-AKI worsening 18 pound weight gain-cardiology nephrology consulted 2/2 NYHA IV symptoms--started on IHD status post tunneled cath 09/22/2024 12/16 readmission obtunded on the floor WBC 17,000 CT contusion with TBI?  Meningioma-Rx Keppra  and was not discharged on the same as neurology did not feel this was a necessity at that time--- was discharged 12/19   Chr med h/o--OSA HTN IDDM hypothyroid HFpEF gout   12/24 came back to the emergency room feeling weak blood pressure low has been on 4 times a week HD had a fever-CBG noted to be 380 lactic acid 1.4 Tmax 101 WBC 21.6 blood pressure 90/40 nephrology consulted-crystalloid bolus fluids given Vanco Flagyl  cefepime  added 12/25 early a.m. seem to be in DKA transferred to progressive unit 2/2 concern for DKA CT head cystic hygromas 4 mm right 3 mm left trace leftward midline shift regressed not resolved inferior frontal gyrus hemorrhagic contusion--37 mm stable right meningioma 12/26 underwent HD and HD cath removed by IR  Assessment and Plan:   Sepsis secondary to MSSA and proteus mirabilis bacteremia related to HD line ID following. Continue cefepime  HD cath removed by IR on 12/26 TEE is now planned for 12/30 given hypo-Na and thrombocytopenia today, see below   DM with hyperglycemia Currently on lantus  9u with SSI Increase to lantus  15u with 2u meal coverage   Immunosuppression secondary to psoriasis follows with Dr. Dianah (dermatology) for  the past 6 years Has not been able to obtain Bimzelx yet holding clobetasol  at this time   Newly declared ESRD-dialyzing via right tunneled cath placed 09/22/2024 Dialyzed 12/26 HD cath removed giving line holiday Nephrology following   HFpEF NYHA IV Will not recommend SGLT2 in the future given groin wounds Amlodipine  5 held Coreg  12.5 twice daily held, torsemide  100 MT ThF held   Hypothyroidism Continue Synthroid  125   Recent TBI brain managed nonsurgically during hospitalization 12/16-12/9. 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 Victory Boers 12/26 neurosurgery --absence of mental status changes focal deficit --nothing surgically specific to do in this case   Scrotal wound in the setting of Fournier's gangrene as above Last seen [facility] surgery 10/13/2024 WOC following   BMI 36 Recommend diet/lifestyle modification Hyponatremia Corrects to 130.6 when factoring in glucose of 328 Recheck bmet in AM Thrombocytopenia Suspect related marrow suppression in the setting of presenting sepsis Recheck cbc in AM No active bleeding      Subjective: No complaints this AM  Physical Exam: Vitals:   11/15/24 1918 11/15/24 2349 11/16/24 0610 11/16/24 0745  BP:  131/66 128/65 136/72  Pulse:   66 69  Resp:    14  Temp: 97.9 F (36.6 C) 98 F (36.7 C) 98.1 F (36.7 C) 97.8 F (36.6 C)  TempSrc: Oral Oral Oral Oral  SpO2:   95% 97%  Weight:       General exam: Conversant, in no acute distress Respiratory system: normal chest rise, clear, no audible wheezing Cardiovascular system:  regular rhythm, s1-s2 Gastrointestinal system: Nondistended, nontender, pos BS Central nervous system: No seizures, no tremors Extremities: No cyanosis, no joint deformities Skin: No rashes, no pallor Psychiatry: Affect normal // no auditory hallucinations   Data Reviewed:  Labs  reviewed: Na 126, K 4.3, Cr 5.71, Alk phos 131, AST <10, ALT 6, WBC 19.5, Hgb 9.6, Plts 67  Family Communication: Pt in room, family not at bedside  Disposition: Status is: Inpatient Remains inpatient appropriate because: severity of illness  Planned Discharge Destination: Rehab    Author: Garnette Pelt, MD 11/16/2024 10:10 AM  For on call review [email or web address].  "

## 2024-11-16 NOTE — Inpatient Diabetes Management (Signed)
 Inpatient Diabetes Program Recommendations  AACE/ADA: New Consensus Statement on Inpatient Glycemic Control (2015)  Target Ranges:  Prepandial:   less than 140 mg/dL      Peak postprandial:   less than 180 mg/dL (1-2 hours)      Critically ill patients:  140 - 180 mg/dL   Lab Results  Component Value Date   GLUCAP 294 (H) 11/16/2024   HGBA1C 8.4 (H) 08/29/2024    Review of Glycemic Control  Latest Reference Range & Units 11/14/24 11:35 11/14/24 16:36 11/14/24 21:35 11/15/24 06:10 11/15/24 11:08 11/15/24 16:22 11/16/24 00:09 11/16/24 06:14 11/16/24 11:46  Glucose-Capillary 70 - 99 mg/dL 720 (H) 643 (H) 714 (H) 289 (H) 311 (H) 296 (H) 322 (H) 312 (H) 294 (H)  (H): Data is abnormally high Diabetes history: Type 2 DM Outpatient Diabetes medications: Lantus  20 units every day, Humalog  0-15 units TID, Mounjaro  15 mg qwk Current orders for Inpatient glycemic control:  Lantus  9 units every day, Novolog  2 units TID, Novolog  0-6 units TID  Inpatient Diabetes Program Recommendations:    Consider increasing Lantus  15 units every day.   Thanks, Tinnie Minus, MSN, RNC-OB Diabetes Coordinator [phone] (8a-5p)

## 2024-11-16 NOTE — Progress Notes (Signed)
Report given to 2W RN, all questions answered.

## 2024-11-16 NOTE — Progress Notes (Signed)
 [facility] NEPHROLOGY PROGRESS NOTE  Assessment/ Plan: Pt is a 68 y.o. yo male with AKI on HD, HTN, T2DM, recent admit for head contusion s/p fall who is being admitted with fever, leukocytosis, and weakness due to sepsis.  He has progressed to ESRD  # Sepsis due to MSSA and Proteus bacteremia: Exact source unknown but concern for [facility].   -TDC removed by IR on 12/26. -abx per ID and primary team.  Currently on cefepime , getting TEE on 12/30, ID is following. - note 11/13/24 cultures NGTD  # ESRD [facility] - [facility]: Last HD on 12/26.   - AKI has progressed to ESRD - TDC was removed on 12/26 for line holiday.  Per ID, 72 hours of line holiday recommended.  - Will plan for dialysis on Tuesday    - Will consult IR for tunneled catheter  placement.  I have discussed with ID, Dr. Overton, as well who is ok with tunneled catheter placement   # Anemia of CKD:  - Continue Aranesp  100 mcg every Sat and monitor hemoglobin.  # Secondary hyperparathyroidism: phos controlled, not on binders.  on calcitriol .  # HTN/volume: optimize volume with HD and added fluid restrictions of 1.5 liters when taking PO  Disposition - continue inpatient monitoring     Subjective:  per charting is ordered for TEE, which was now moved to 12/30 per pt.  Last HD on 12/25 with 2.9 kg UF.  There is no uop charted for 12/28.  He's ok with getting HD catheter tomorrow.   Review of systems:  Denies shortness of breath or chest pain  Denies n/v    Objective Vital signs in last 24 hours: Vitals:   11/15/24 2349 11/16/24 0610 11/16/24 0745 11/16/24 1147  BP: 131/66 128/65 136/72 (!) 132/56  Pulse:  66 69 71  Resp:   14 15  Temp: 98 F (36.7 C) 98.1 F (36.7 C) 97.8 F (36.6 C) 98.7 F (37.1 C)  TempSrc: Oral Oral Oral Oral  SpO2:  95% 97% 99%  Weight:       Weight change:   Intake/Output Summary (Last 24 hours) at 11/16/2024 1525 Last data filed at 11/16/2024 0300 Gross per 24 hour  Intake 259.87  ml  Output --  Net 259.87 ml       Labs: RENAL PANEL Recent Labs  Lab 11/12/24 0307 11/12/24 0500 11/13/24 0426 11/14/24 0449 11/15/24 0326 11/16/24 0355  NA 126* 124* 129* 127* 127* 126*  K 4.2 4.1 3.8 4.1 4.4 4.3  CL 90* 90* 94* 92* 91* 91*  CO2 23 21* 22 24 25 23   GLUCOSE 531* 495* 218* 262* 298* 328*  BUN 41* 41* 29* 44* 54* 59*  CREATININE 4.49* 4.54* 3.18* 4.54* 5.30* 5.71*  CALCIUM 8.3* 8.3* 8.2* 8.1* 8.1* 8.3*  PHOS 4.1  --  2.4* 3.1 3.1  --   ALBUMIN  2.5*  --  2.3* 2.2* 2.3* 2.3*    Liver Function Tests: Recent Labs  Lab 11/11/24 1120 11/12/24 0307 11/14/24 0449 11/15/24 0326 11/16/24 0355  AST 17  --   --   --  <10*  ALT 10  --   --   --  6  ALKPHOS 116  --   --   --  131*  BILITOT 1.2  --   --   --  0.6  PROT 6.1*  --   --   --  6.1*  ALBUMIN  2.5*   < > 2.2* 2.3* 2.3*   < > =  values in this interval not displayed.   No results for input(s): LIPASE, AMYLASE in the last 168 hours. No results for input(s): AMMONIA in the last 168 hours. CBC: Recent Labs    03/10/24 1213 08/28/24 1613 09/17/24 0322 09/18/24 1541 11/12/24 0306 11/13/24 0729 11/14/24 0449 11/15/24 0326 11/16/24 0355  HGB 12.2*   < > 8.8*   < > 9.6* 10.5* 9.3* 9.5* 9.6*  MCV 83.9   < > 82.4   < > 83.0 81.8 81.4 81.9 81.0  VITAMINB12  --   --  443  --   --   --   --   --   --   FOLATE  --   --  5.3*  --   --   --   --   --   --   FERRITIN 91  --  40  --   --   --   --   --   --   TIBC 218*  --  190*  --   --   --   --   --   --   IRON  40*  --  39*  --   --   --   --   --   --   RETICCTPCT  --   --  2.3  --   --   --   --   --   --    < > = values in this interval not displayed.    Cardiac Enzymes: No results for input(s): CKTOTAL, CKMB, CKMBINDEX, TROPONINI in the last 168 hours. CBG: Recent Labs  Lab 11/15/24 1108 11/15/24 1622 11/16/24 0009 11/16/24 0614 11/16/24 1146  GLUCAP 311* 296* 322* 312* 294*    Iron  Studies: No results for input(s): IRON ,  TIBC, TRANSFERRIN, FERRITIN in the last 72 hours. Studies/Results: No results found.   Medications: Infusions:  ceFEPime  (MAXIPIME ) IV 1 g (11/16/24 1033)    Scheduled Medications:  calcitRIOL   0.25 mcg Oral Daily   Chlorhexidine  Gluconate Cloth  6 each Topical Daily   Chlorhexidine  Gluconate Cloth  6 each Topical Q0600   darbepoetin (ARANESP ) injection - DIALYSIS  100 mcg Subcutaneous Q Sat-1800   insulin  aspart  0-6 Units Subcutaneous TID WC   insulin  aspart  2 Units Subcutaneous TID WC   [START ON 11/17/2024] insulin  glargine  15 Units Subcutaneous Daily   levothyroxine   125 mcg Oral q AM   polyethylene glycol  17 g Oral BID   simvastatin   20 mg Oral QPM   sodium chloride  flush  10-40 mL Intracatheter Q12H   sodium chloride  flush  3 mL Intravenous Q12H    have reviewed scheduled and prn medications.  Physical Exam:   General adult male in bed in no acute distress HEENT normocephalic atraumatic extraocular movements intact sclera anicteric Neck supple trachea midline Lungs clear to auscultation bilaterally normal work of breathing at rest  Heart S1S2 no rub Abdomen soft nontender obese habitus Extremities trace edema  Psych normal mood and affect Neuro - alert and oriented x 3 provides hx and follows commands  Dialysis access - none currently    Outpatient Dialysis Orders:  [facility]F - [facility] 3hr, 400/A1.5, EDW 125kg, 3K/2.5Ca bath, TDC, heparin  5000 unit bolus - Mircera 150mcg IV q 2 weeks (100mcg given 12/5) - Calcitriol  0.30mcg PO q HD    Clance Baquero C Sheridan Gettel 11/16/2024,3:25 PM  LOS: 5 days

## 2024-11-16 NOTE — Plan of Care (Signed)
  Problem: Education: Goal: Ability to describe self-care measures that may prevent or decrease complications (Diabetes Survival Skills Education) will improve Outcome: Progressing Goal: Individualized Educational Video(s) Outcome: Progressing   Problem: Coping: Goal: Ability to adjust to condition or change in health will improve Outcome: Progressing   Problem: Fluid Volume: Goal: Ability to maintain a balanced intake and output will improve Outcome: Progressing   Problem: Health Behavior/Discharge Planning: Goal: Ability to identify and utilize available resources and services will improve Outcome: Progressing Goal: Ability to manage health-related needs will improve Outcome: Progressing   Problem: Metabolic: Goal: Ability to maintain appropriate glucose levels will improve Outcome: Progressing   Problem: Nutritional: Goal: Maintenance of adequate nutrition will improve Outcome: Progressing Goal: Progress toward achieving an optimal weight will improve Outcome: Progressing   Problem: Skin Integrity: Goal: Risk for impaired skin integrity will decrease Outcome: Progressing   Problem: Tissue Perfusion: Goal: Adequacy of tissue perfusion will improve Outcome: Progressing   Problem: Education: Goal: Knowledge of General Education information will improve Description: Including pain rating scale, medication(s)/side effects and non-pharmacologic comfort measures Outcome: Progressing   Problem: Health Behavior/Discharge Planning: Goal: Ability to manage health-related needs will improve Outcome: Progressing   Problem: Clinical Measurements: Goal: Ability to maintain clinical measurements within normal limits will improve Outcome: Progressing Goal: Will remain free from infection Outcome: Progressing Goal: Diagnostic test results will improve Outcome: Progressing Goal: Respiratory complications will improve Outcome: Progressing Goal: Cardiovascular complication will  be avoided Outcome: Progressing   Problem: Activity: Goal: Risk for activity intolerance will decrease Outcome: Progressing   Problem: Nutrition: Goal: Adequate nutrition will be maintained Outcome: Progressing   Problem: Coping: Goal: Level of anxiety will decrease Outcome: Progressing   Problem: Elimination: Goal: Will not experience complications related to bowel motility Outcome: Progressing Goal: Will not experience complications related to urinary retention Outcome: Progressing   Problem: Pain Managment: Goal: General experience of comfort will improve and/or be controlled Outcome: Progressing   Problem: Safety: Goal: Ability to remain free from injury will improve Outcome: Progressing   Problem: Skin Integrity: Goal: Risk for impaired skin integrity will decrease Outcome: Progressing   Problem: Education: Goal: Ability to describe self-care measures that may prevent or decrease complications (Diabetes Survival Skills Education) will improve Outcome: Progressing Goal: Individualized Educational Video(s) Outcome: Progressing   Problem: Cardiac: Goal: Ability to maintain an adequate cardiac output will improve Outcome: Progressing   Problem: Health Behavior/Discharge Planning: Goal: Ability to identify and utilize available resources and services will improve Outcome: Progressing Goal: Ability to manage health-related needs will improve Outcome: Progressing   Problem: Fluid Volume: Goal: Ability to achieve a balanced intake and output will improve Outcome: Progressing   Problem: Metabolic: Goal: Ability to maintain appropriate glucose levels will improve Outcome: Progressing   Problem: Nutritional: Goal: Maintenance of adequate nutrition will improve Outcome: Progressing Goal: Maintenance of adequate weight for body size and type will improve Outcome: Progressing   Problem: Respiratory: Goal: Will regain and/or maintain adequate  ventilation Outcome: Progressing   Problem: Urinary Elimination: Goal: Ability to achieve and maintain adequate renal perfusion and functioning will improve Outcome: Progressing

## 2024-11-16 NOTE — Plan of Care (Signed)

## 2024-11-16 NOTE — Consult Note (Addendum)
 "     Physical Medicine and Rehabilitation Consult Reason for Consult:Rehab Referring Physician: Dr. Cindy   HPI: Brendan [name]. is a 68 y.o. male Fournier's gangrene, CHF, hypothyroidism, ESRD on hemodialysis, hypertension, hyperlipidemia, diabetes mellitus type 2, obesity who was admitted for sepsis.  Patient had recent hospitalization 12/16 to 12/19 after a fall with concern for TBI, MRI of the brain showed small hemorrhagic contusions right greater than left gyrus recti, trace regional subarachnoid hemorrhage at the anterior frontal lobes bilaterally, enhancing extra-axial mass overlying the anterior right frontal convexity, most characteristic of meningioma.  Inpatient rehab admissions coordinator met with patient on 12/19 however patient and wife preferred to discharge home with home health services.  CT head on 11/13/2024 with new small bilateral low-density subdural collections, trace leftward midline shift, regressed but not fully resolved left frontal gyrus hemorrhagic contusions, stable right anterior convexity meningioma.  He had blood cultures from admission that grew MSSA and Proteus. Infection suspected due to HD line.  Patient was seen by infectious disease, plan to continue cefepime . TDC removed for line holiday.  Patient was seen by PT and OT found to have functional deficits and felt to be a candidate for inpatient rehabilitation  Per chart review patient lives in a 1 level home with 4 steps to enter. Using a walker or cane at his home. He lives with his wife who works. He thinks she could take time off work if necessary .    Home: Home Living Family/patient expects to be discharged to:: Private residence Living Arrangements: Spouse/significant other Available Help at Discharge: Family, Available PRN/intermittently Type of Home: House Home Access: Stairs to enter [employer] of Steps: 4 Entrance Stairs-Rails: Right, Left, Can reach both Home Layout: One  level Bathroom Shower/Tub: Health Visitor: Handicapped height (has BSC) Bathroom Accessibility: Yes Home Equipment: Cane - single point, Hand held shower head, Rolling Walker (2 wheels), BSC/3in1, Rollator (4 wheels) Additional Comments: Pt lives with wife who works during the day. Wife takes him to HD before going to work and then picks him up during her lunch hour and takes him home.  Functional History: Prior Function Prior Level of Function : Independent/Modified Independent Mobility Comments: Mod I, reports primarily use of rollator to walk ADLs Comments: Reports previously mod I, however wife has been assisting with LB ADLs since 12/16 admission Functional Status:  Mobility: Bed Mobility Overal bed mobility: Needs Assistance Bed Mobility: Supine to Sit, Sit to Supine Supine to sit: Min assist, HOB elevated, Used rails Sit to supine: Contact guard assist, Used rails General bed mobility comments: Cues for sequencing, min assist for trunk. Pt able to return BLEs to bed and adjust trunk in bed Transfers Overall transfer level: Needs assistance Equipment used: Rolling walker (2 wheels) Transfers: Sit to/from Stand Sit to Stand: Min assist, From elevated surface General transfer comment: Min assist to rise from elevated bed height. Cues for hand placement. Ambulation/Gait Ambulation/Gait assistance: Min assist, Mod assist Gait Distance (Feet): 12 Feet Assistive device: Rolling walker (2 wheels) Gait Pattern/deviations: Step-to pattern, Decreased step length - right, Decreased step length - left, Decreased stride length, Knee flexed in stance - right, Trunk flexed, Wide base of support General Gait Details: Pt takes slow, small, wide, and unsteady steps with the RW. Pt displayed R knee instability, primarily hyperextending it when ambulating, which seemed to worsen as distance progressed. He needed minA for balance and modA to manage the RW when turning and stepping back  to the  bed to prepare to sit. Gait velocity: reduced Gait velocity interpretation: <1.31 ft/sec, indicative of household ambulator    ADL: ADL Overall ADL's : Needs assistance/impaired Eating/Feeding: Set up, Sitting Grooming: Set up, Sitting Upper Body Bathing: Set up, Sitting Lower Body Bathing: Moderate assistance, Sit to/from stand Upper Body Dressing : Set up, Sitting Lower Body Dressing: Moderate assistance, Sit to/from stand Toilet Transfer: Moderate assistance, Ambulation, Rolling walker (2 wheels) Toilet Transfer Details (indicate cue type and reason): ~81ft limited activity tolerance Functional mobility during ADLs: Moderate assistance, Rolling walker (2 wheels) General ADL Comments: Limited by decreased activity tolerance and strength  Cognition: Cognition Orientation Level: Oriented X4 [place] Scales of Cognitive Functioning: Purposeful, Appropriate: Stand-by Assistance Cognition Arousal: Alert Behavior During Therapy: Flat affect   Review of Systems  Constitutional:  Negative for chills and fever.  HENT:  Negative for hearing loss.   Eyes:  Negative for blurred vision and double vision.  Respiratory:  Negative for cough and shortness of breath.   Cardiovascular:  Negative for chest pain.  Gastrointestinal:  Negative for abdominal pain, constipation, diarrhea, nausea and vomiting.  Genitourinary: Negative.   Musculoskeletal:  Negative for back pain, joint pain and neck pain.  Skin:  Negative for rash.       Wounds groin  Neurological:  Positive for weakness. Negative for dizziness, sensory change, speech change and focal weakness.  Psychiatric/Behavioral:  The patient does not have insomnia.    Past Medical History:  Diagnosis Date   Acanthosis nigricans    Atopic dermatitis    CHF (congestive heart failure) (HCC)    CKD (chronic kidney disease) stage 4, GFR 15-29 ml/min (HCC) 01/08/2023   Diabetes (HCC) 11/19/2002   Erectile dysfunction     Fatty liver 11/19/2005   Glaucoma 11/19/2006   Gynecomastia 11/20/2007   Hx of adenomatous colonic polyps 08/26/2023   Hyperaldosteronism 11/19/1998   Hypercholesterolemia    Hyperlipidemia 2010   Hypertension 1999   Hypoglycemic reaction    Hypothyroidism 11/19/2002   Incontinence    Intermittent vertigo 11/19/2010   Left cervical radiculopathy 11/19/2010   Lumbar radiculopathy    Obesity    Peripheral neuropathy    Pollen allergies 11/19/2005   perennial   Prostate cancer (HCC) 11/19/2004   Reflux esophagitis 11/19/1993   Sickle cell trait 11/19/2004   Sleep apnea, obstructive 11/19/1998   uses a cpap   Venous insufficiency 11/20/2003   Vitamin D deficiency 11/19/2010   Past Surgical History:  Procedure Laterality Date   COLONOSCOPY  2006   normal   INCISION AND DRAINAGE OF WOUND N/A 08/28/2024   Procedure: IRRIGATION AND EXCISIONAL DEBRIDEMENT WOUND;  Surgeon: Lyndel Deward PARAS, MD;  Location: [facility] OR;  Service: General;  Laterality: N/A;  EXCISIONAL DEBRIDEMENT   INCISION AND DRAINAGE PERIRECTAL ABSCESS N/A 08/30/2024   Procedure: INCISION AND DRAINAGE OF PERINEAL WOUND;  Surgeon: Polly Cordella LABOR, MD;  Location: [facility] OR;  Service: General;  Laterality: N/A;   IR REMOVAL TUN CV CATH W/O FL  11/13/2024   IR TUNNELED CENTRAL VENOUS CATH PLC W IMG  09/18/2024   IR TUNNELED CENTRAL VENOUS CATH PLC W IMG  09/22/2024   lap band surgery  2009   left inguinal hernia repair  1998   LIPOMA EXCISION Left 04/17/2023   Procedure: MINOR EXCISION LEFT BUTTOCK SEBACEOUS CYST;  Surgeon: Lyndel Deward PARAS, MD;  Location:  [facility];  Service: General;  Laterality: Left;   robotic prostatectomy  2008   Family History  Problem Relation Age of Onset   Heart failure Mother    Hypertension Father        deceased age 71   Heart attack Father    COPD Sister    CVA Sister    Diabetes Daughter    Colon cancer Neg Hx    Stomach cancer Neg Hx    Colon polyps Neg Hx     Esophageal cancer Neg Hx    Rectal cancer Neg Hx    Social History:  reports that he has never smoked. He has never used smokeless tobacco. He reports current alcohol use. He reports that he does not use drugs. Allergies: Allergies[1] Medications Prior to Admission  Medication Sig Dispense Refill   acetaminophen  (TYLENOL ) 500 MG tablet Take 500 mg by mouth every 6 (six) hours as needed for mild pain.     amLODipine  (NORVASC ) 5 MG tablet Take 1 tablet (5 mg total) by mouth daily. 90 tablet 3   bimekizumab-bkzx (BIMZELX) 160 MG/ML prefilled syringe Inject 1 Syringe into the skin every 30 (thirty) days.     carvedilol  (COREG ) 12.5 MG tablet Take 1 tablet (12.5 mg total) by mouth 2 (two) times daily with a meal. Follow your blood pressure outpatient and get refills and adjustments with your PCP outpatient 60 tablet 0   dorzolamide  (TRUSOPT ) 2 % ophthalmic solution Place 1 drop into both eyes daily.     insulin  glargine (LANTUS  SOLOSTAR) 100 UNIT/ML Solostar Pen Inject 15 Units into the skin daily. Follow up with your PCP outpatient for further adjustment to your regimen (Patient taking differently: Inject 20 Units into the skin daily. Follow up with your PCP outpatient for further adjustment to your regimen)     insulin  lispro (HUMALOG  KWIKPEN) 200 UNIT/ML KwikPen Inject 0-15 Units into the skin 3 (three) times daily with meals. CBG < 70: treat low blood sugar CBG 70 - 120: 0 units  CBG 121 - 150: 2 units  CBG 151 - 200: 3 units  CBG 201 - 250: 5 units  CBG 251 - 300: 8 units  CBG 301 - 350: 11 units  CBG 351 - 400: 15 units  CBG >400: Call MD     levothyroxine  (SYNTHROID ) 125 MCG tablet Take 1 tablet (125 mcg total) by mouth every morning on an empty stomach 30 tablet 11   simvastatin  (ZOCOR ) 20 MG tablet Take 1 tablet (20 mg total) by mouth every evening. 90 tablet 3   tirzepatide  (MOUNJARO ) 15 MG/0.5ML Pen Inject 15 mg into the skin once a week. 6 mL 4   torsemide  (DEMADEX ) 100 MG tablet Take  1 tablet (100 mg total) by mouth in the morning except on dialysis days (Patient taking differently: Take 100 mg by mouth See admin instructions. Take one tablet by mouth on Mondays, Tuesdays, Thursdays and Fridays only per patient) 30 tablet 3   clobetasol  ointment (TEMOVATE ) 0.05 % Apply topically daily before sleeping. (Patient not taking: Reported on 08/29/2024) 60 g 1   Continuous Glucose Sensor (FREESTYLE LIBRE 3 PLUS SENSOR) MISC Apply to upper back of arm. Change every 15 days. 2 each 12   Continuous Glucose Sensor (FREESTYLE LIBRE 3 SENSOR) MISC Apply to upper back of arm and change ever 14 days. 6 each 3   Insulin  Pen Needle (TECHLITE PEN NEEDLES) 32G X 4 MM MISC Use to inject insulin  4 times daily 400 each 4   Insulin  Pen Needle 32G X 4 MM MISC Use to inject insulin  up to 4  times daily. 400 each 4   timolol  (BETIMOL ) 0.5 % ophthalmic solution Place 1 drop into both eyes daily.       Blood pressure (!) 132/56, pulse 71, temperature 98.7 F (37.1 C), temperature source Oral, resp. rate 15, weight 127.6 kg, SpO2 99%. Physical Exam  General: No apparent distress, resting in bed HEENT: Head is normocephalic,  bruising around both eyes, mucous membranes a little dry Heart: Reg rate and rhythm. No murmurs rubs or gallops Chest: CTA bilaterally, non-labored Abdomen: Soft, non-tender, non-distended, bowel sounds positive. Extremities: No clubbing, cyanosis.  Trace edema in his arms and legs Psych: Pt's affect is appropriate. Pt is cooperative Skin: Psoriasis skin changes on his hands GU: purwick in place,Groin wounds not examined today Neuro:    Mental Status: AAOx4, able to recall his address and birthday.  Able to recall 3 words about 5 minutes later Speech/Languate: Naming and repetition intact, fluent, follows commands CRANIAL NERVES: II: PERRL. Visual fields full III, IV, VI: EOM intact, no gaze preference or deviation V: normal sensation bilaterally VII: no asymmetry VIII:  normal hearing to speech IX, X: normal palatal elevation XI: 5/5 head turn and 5/5 shoulder shrug bilaterally XII: Tongue midline   MOTOR: Bilateral upper extremities 4+ out of 5 throughout RLE: HF 4/5, KE 4+/5, ADF 4+/5, APF 4+/5 LLE: HF 4/5, KE 4+/5, ADF 4+/5, APF 4+/5   REFLEXES: No hyperreflexia, no ankle clonus  SENSORY: Normal to touch all 4 extremities  Coordination: Normal finger to nose bilaterally    Results for orders placed or performed during the hospital encounter of 11/11/24 (from the past 24 hours)  Glucose, capillary     Status: Abnormal   Collection Time: 11/15/24  4:22 PM  Result Value Ref Range   Glucose-Capillary 296 (H) 70 - 99 mg/dL  Glucose, capillary     Status: Abnormal   Collection Time: 11/16/24 12:09 AM  Result Value Ref Range   Glucose-Capillary 322 (H) 70 - 99 mg/dL  CBC     Status: Abnormal   Collection Time: 11/16/24  3:55 AM  Result Value Ref Range   WBC 19.5 (H) 4.0 - 10.5 K/uL   RBC 3.42 (L) 4.22 - 5.81 MIL/uL   Hemoglobin 9.6 (L) 13.0 - 17.0 g/dL   HCT 72.2 (L) 60.9 - 47.9 %   MCV 81.0 80.0 - 100.0 fL   MCH 28.1 26.0 - 34.0 pg   MCHC 34.7 30.0 - 36.0 g/dL   RDW 83.7 (H) 88.4 - 84.4 %   Platelets 67 (L) 150 - 400 K/uL   nRBC 0.0 0.0 - 0.2 %  Comprehensive metabolic panel with GFR     Status: Abnormal   Collection Time: 11/16/24  3:55 AM  Result Value Ref Range   Sodium 126 (L) 135 - 145 mmol/L   Potassium 4.3 3.5 - 5.1 mmol/L   Chloride 91 (L) 98 - 111 mmol/L   CO2 23 22 - 32 mmol/L   Glucose, Bld 328 (H) 70 - 99 mg/dL   BUN 59 (H) 8 - 23 mg/dL   Creatinine, Ser 4.28 (H) 0.61 - 1.24 mg/dL   Calcium 8.3 (L) 8.9 - 10.3 mg/dL   Total Protein 6.1 (L) 6.5 - 8.1 g/dL   Albumin  2.3 (L) 3.5 - 5.0 g/dL   AST <89 (L) 15 - 41 U/L   ALT 6 0 - 44 U/L   Alkaline Phosphatase 131 (H) 38 - 126 U/L   Total Bilirubin 0.6 0.0 - 1.2 mg/dL  GFR, Estimated 10 (L) >60 mL/min   Anion gap 12 5 - 15  Glucose, capillary     Status: Abnormal    Collection Time: 11/16/24  6:14 AM  Result Value Ref Range   Glucose-Capillary 312 (H) 70 - 99 mg/dL  Glucose, capillary     Status: Abnormal   Collection Time: 11/16/24 11:46 AM  Result Value Ref Range   Glucose-Capillary 294 (H) 70 - 99 mg/dL   No results found.  Assessment/Plan: Diagnosis: Debility due to sepsis with recent TBI Does the need for close, 24 hr/day medical supervision in concert with the patient's rehab needs make it unreasonable for this patient to be served in a less intensive setting? Yes Co-Morbidities requiring supervision/potential complications:  - Diabetes mellitus, psoriasis, ESRD on dialysis, heart failure, hypothyroidism, scrotal wound, obesity, constipation, OSA Due to bladder management, bowel management, safety, skin/wound care, disease management, medication administration, pain management, and patient education, does the patient require 24 hr/day rehab nursing? Yes Does the patient require coordinated care of a physician, rehab nurse, therapy disciplines of PT/OT to address physical and functional deficits in the context of the above medical diagnosis(es)? Yes Addressing deficits in the following areas: balance, endurance, locomotion, strength, transferring, bowel/bladder control, bathing, dressing, feeding, grooming, toileting, and psychosocial support Can the patient actively participate in an intensive therapy program of at least 3 hrs of therapy per day at least 5 days per week? Yes The potential for patient to make measurable gains while on inpatient rehab is excellent Anticipated functional outcomes upon discharge from inpatient rehab are modified independent and supervision  with PT, modified independent and supervision with OT, modified independent and supervision with SLP. Estimated rehab length of stay to reach the above functional goals is: 10-12 days Anticipated discharge destination: Home Overall Rehab/Functional Prognosis: excellent  POST ACUTE  RECOMMENDATIONS: This patient's condition is appropriate for continued rehabilitative care in the following setting: CIR Patient has agreed to participate in recommended program. Yes Note that insurance prior authorization may be required for reimbursement for recommended care.  Comment: I think patient would be a candidate for CIR, rehab admissions coordinator follow-up.   MEDICAL RECOMMENDATIONS: Consider additional laxative to help with constipation Could consider SLP consult to assess cognition, patient reports a mild but improving cognitive changes since his recent TBI   I have personally performed a face to face diagnostic evaluation of this patient. Additionally, I have examined the patient's medical record including any pertinent labs and radiographic images.    Thanks,  Murray Collier, MD 11/16/2024     [1]  Allergies Allergen Reactions   Ace Inhibitors Cough   Maxidex  [Dexamethasone ] Other (See Comments)    Sexual dysfunction   Verapamil Other (See Comments)    constipation   "

## 2024-11-16 NOTE — Progress Notes (Signed)
 Physical Therapy Treatment Patient Details Name: Brendan [name]. MRN: [id number] DOB: [birth year] Today's Date: 11/16/2024   History of Present Illness Pt is a 68 yo male who presented 11/11/24 with hypotension and generalized fatigue/weakness. Pt admitted with sepsis MSSA/prevotella source unclear but concern for [facility]. TDC removed 12/26. Of note, pt recently admitted 12/16-12/19 due to a fall with MRI showing large scalp contusion, trace SAH anterior frontal lobes, and small hemorrhagic interventricular contusions, Rt frontal meningioma. PMH - ESRD on HD, necrotizing fasciitis, DM2, HTN, hypothyroidism, OSA, obesity, CKD, CHF, glaucoma, gynecomastia, HLD, vertigo, obesity, peripheral neuropathy, prostate CA, sickle cell trait    PT Comments  Pt resting in bed on arrival, agreeable to session with encouragement as pt reporting fatigue this date. Pt able to come to sitting EOB with mod A and perform transfers sit<>stand with min A from elevated EOB. Pt declining ambulation this session, however able to maintain standing ~3-4 mins and take steps along EOB to HOB. Pt performing seated LE exercises for strength maintenance. Pt agreeable to time seated up EOB with lunch tray set up at end of session. Pt continues to be limited in safe mobility by decreased insight into current deficits, impaired balance/postural reactions, weakness and decreased activity tolerance. Pt continues to benefit from skilled PT services to progress toward functional mobility goals.     If plan is discharge home, recommend the following: A lot of help with walking and/or transfers;A lot of help with bathing/dressing/bathroom;Assistance with cooking/housework;Direct supervision/assist for medications management;Direct supervision/assist for financial management;Assist for transportation;Help with stairs or ramp for entrance;Supervision due to cognitive status   Can travel by private vehicle        Equipment Recommendations   Hospital bed;Wheelchair (measurements PT);Wheelchair cushion (measurements PT) (pending progress)    Recommendations for Other Services       Precautions / Restrictions Precautions Precautions: Fall Recall of Precautions/Restrictions: Intact Restrictions Weight Bearing Restrictions Per Provider Order: No     Mobility  Bed Mobility Overal bed mobility: Needs Assistance Bed Mobility: Supine to Sit     Supine to sit: Mod assist     General bed mobility comments: Cues for sequencing, mod assist for trunk.    Transfers Overall transfer level: Needs assistance Equipment used: Rolling walker (2 wheels) Transfers: Sit to/from Stand Sit to Stand: Min assist, From elevated surface           General transfer comment: Min assist to rise from elevated bed height. Cues for hand placement.    Ambulation/Gait Ambulation/Gait assistance: Min assist Gait Distance (Feet): 2 Feet Assistive device: Rolling walker (2 wheels) Gait Pattern/deviations: Step-to pattern, Decreased step length - right, Decreased step length - left, Decreased stride length, Knee flexed in stance - right, Trunk flexed, Wide base of support Gait velocity: reduced     General Gait Details: pt taking steps along EOB to HOB, declining ambulation in room/away from EOB   Stairs             Wheelchair Mobility     Tilt Bed    Modified Rankin (Stroke Patients Only)       Balance Overall balance assessment: Needs assistance Sitting-balance support: No upper extremity supported, Feet supported Sitting balance-Leahy Scale: Good Sitting balance - Comments: Sits unsupported EOB with supervision, no LOB, seated up EOB at end of session with lunch tray set up   Standing balance support: Bilateral upper extremity supported, During functional activity, Reliant on assistive device for balance Standing balance-Leahy Scale: Poor Standing balance comment:  Reliant on [employer] Communication Communication: No apparent difficulties  Cognition Arousal: Alert Behavior During Therapy: Flat affect   PT - Cognitive impairments: Problem solving, Rancho level, Awareness, Sequencing                       PT - Cognition Comments: slow processing and needs cues to problem solve Following commands: Impaired      Cueing Cueing Techniques: Verbal cues, Tactile cues  Exercises General Exercises - Lower Extremity Long Arc Quad: AROM, Both, Seated, Strengthening, 10 reps (x2 sets) Hip Flexion/Marching: AROM, Both, Seated, Strengthening, 20 reps (x2 sets)    General Comments General comments (skin integrity, edema, etc.): VSS on RA      Pertinent Vitals/Pain Pain Assessment Pain Assessment: Faces Faces Pain Scale: No hurt Pain Intervention(s): Monitored during session    Home Living                          Prior Function            PT Goals (current goals can now be found in the care plan section) Acute Rehab PT Goals Patient Stated Goal: to go to [facility] PT Goal Formulation: With patient/family Time For Goal Achievement: 11/27/24 Progress towards PT goals: Progressing toward goals    Frequency    Min 3X/week      PT Plan      Co-evaluation              AM-PAC PT 6 Clicks Mobility   Outcome Measure  Help needed turning from your back to your side while in a flat bed without using bedrails?: A Little Help needed moving from lying on your back to sitting on the side of a flat bed without using bedrails?: A Lot Help needed moving to and from a bed to a chair (including a wheelchair)?: A Little Help needed standing up from a chair using your arms (e.g., wheelchair or bedside chair)?: A Little Help needed to walk in hospital room?: Total (<20 ft) Help needed climbing 3-5 steps with a railing? : Total 6 Click Score: 13    End of Session   Activity Tolerance: Patient tolerated treatment well Patient left: in  bed;with call bell/phone within reach;with bed alarm set;Other (comment) (sitting EOB with blinds open to hall) Nurse Communication: Mobility status;Other (comment) (pt sitting EOB) PT Visit Diagnosis: Other abnormalities of gait and mobility (R26.89);Muscle weakness (generalized) (M62.81);Unsteadiness on feet (R26.81);Difficulty in walking, not elsewhere classified (R26.2);History of falling (Z91.81)     Time: 8662-8598 PT Time Calculation (min) (ACUTE ONLY): 24 min  Charges:    $Therapeutic Exercise: 8-22 mins $Therapeutic Activity: 8-22 mins PT General Charges $$ ACUTE PT VISIT: 1 Visit                     Anaily Ashbaugh R. PTA Acute Rehabilitation Services Office: [phone]   Therisa CHRISTELLA Boor 11/16/2024, 2:09 PM

## 2024-11-16 NOTE — Progress Notes (Signed)
 Brief ID note   Awaiting TEE on 12/30. No changes for now. Will see back once study results area available    Corean Fireman, MSN, NP-C [facility] for Infectious Disease [facility]  [email or web address] Pager: [phone] Office: [phone] [facility] Main Line: [phone] *Secure Chat Communication Welcome

## 2024-11-17 ENCOUNTER — Inpatient Hospital Stay (HOSPITAL_COMMUNITY)

## 2024-11-17 ENCOUNTER — Encounter (HOSPITAL_COMMUNITY): Payer: Self-pay | Admitting: Internal Medicine

## 2024-11-17 DIAGNOSIS — R509 Fever, unspecified: Secondary | ICD-10-CM | POA: Diagnosis not present

## 2024-11-17 HISTORY — PX: IR TUNNELED CENTRAL VENOUS CATH PLC W IMG: IMG1939

## 2024-11-17 LAB — GLUCOSE, CAPILLARY
Glucose-Capillary: 298 mg/dL — ABNORMAL HIGH (ref 70–99)
Glucose-Capillary: 245 mg/dL — ABNORMAL HIGH (ref 70–99)
Glucose-Capillary: 275 mg/dL — ABNORMAL HIGH (ref 70–99)
Glucose-Capillary: 263 mg/dL — ABNORMAL HIGH (ref 70–99)
Glucose-Capillary: 239 mg/dL — ABNORMAL HIGH (ref 70–99)

## 2024-11-17 LAB — CBC
HCT: 25.9 % — ABNORMAL LOW (ref 39.0–52.0)
Hemoglobin: 9.1 g/dL — ABNORMAL LOW (ref 13.0–17.0)
MCH: 28.1 pg (ref 26.0–34.0)
MCHC: 35.1 g/dL (ref 30.0–36.0)
MCV: 79.9 fL — ABNORMAL LOW (ref 80.0–100.0)
Platelets: 93 K/uL — ABNORMAL LOW (ref 150–400)
RBC: 3.24 MIL/uL — ABNORMAL LOW (ref 4.22–5.81)
RDW: 16.5 % — ABNORMAL HIGH (ref 11.5–15.5)
WBC: 17.6 K/uL — ABNORMAL HIGH (ref 4.0–10.5)
nRBC: 0 % (ref 0.0–0.2)

## 2024-11-17 LAB — COMPREHENSIVE METABOLIC PANEL WITH GFR
ALT: 7 U/L (ref 0–44)
AST: 11 U/L — ABNORMAL LOW (ref 15–41)
Albumin: 2.3 g/dL — ABNORMAL LOW (ref 3.5–5.0)
Alkaline Phosphatase: 122 U/L (ref 38–126)
Anion gap: 11 (ref 5–15)
BUN: 66 mg/dL — ABNORMAL HIGH (ref 8–23)
CO2: 23 mmol/L (ref 22–32)
Calcium: 8.2 mg/dL — ABNORMAL LOW (ref 8.9–10.3)
Chloride: 92 mmol/L — ABNORMAL LOW (ref 98–111)
Creatinine, Ser: 6.18 mg/dL — ABNORMAL HIGH (ref 0.61–1.24)
GFR, Estimated: 9 mL/min — ABNORMAL LOW
Glucose, Bld: 265 mg/dL — ABNORMAL HIGH (ref 70–99)
Potassium: 4.3 mmol/L (ref 3.5–5.1)
Sodium: 126 mmol/L — ABNORMAL LOW (ref 135–145)
Total Bilirubin: 0.5 mg/dL (ref 0.0–1.2)
Total Protein: 6 g/dL — ABNORMAL LOW (ref 6.5–8.1)

## 2024-11-17 MED ORDER — GELATIN ABSORBABLE 12-7 MM EX MISC
CUTANEOUS | Status: AC
  Filled 2024-11-17: qty 1

## 2024-11-17 MED ORDER — MIDAZOLAM HCL 2 MG/2ML IJ SOLN
INTRAMUSCULAR | Status: AC
  Filled 2024-11-17: qty 2

## 2024-11-17 MED ORDER — CEFAZOLIN SODIUM-DEXTROSE 2-4 GM/100ML-% IV SOLN: 2.0000 g | Freq: Once | INTRAVENOUS | Status: DC

## 2024-11-17 MED ORDER — FENTANYL CITRATE (PF) 100 MCG/2ML IJ SOLN
INTRAMUSCULAR | Status: AC
  Filled 2024-11-17: qty 2

## 2024-11-17 MED ORDER — INSULIN ASPART 100 UNIT/ML IJ SOLN
4.0000 [IU] | Freq: Three times a day (TID) | INTRAMUSCULAR | Status: DC
  Administered 2024-11-17 – 2024-11-20 (×4): 4 [IU] via SUBCUTANEOUS
  Filled 2024-11-17 (×5): qty 4

## 2024-11-17 MED ORDER — FENTANYL CITRATE (PF) 100 MCG/2ML IJ SOLN
INTRAMUSCULAR | Status: AC | PRN
  Administered 2024-11-17 (×2): 25 ug via INTRAVENOUS

## 2024-11-17 MED ORDER — CEFAZOLIN SODIUM-DEXTROSE 2-4 GM/100ML-% IV SOLN
INTRAVENOUS | Status: AC
  Filled 2024-11-17: qty 100

## 2024-11-17 MED ORDER — MIDAZOLAM HCL (PF) 2 MG/2ML IJ SOLN
INTRAMUSCULAR | Status: AC | PRN
  Administered 2024-11-17: 1 mg via INTRAVENOUS

## 2024-11-17 MED ORDER — LIDOCAINE HCL 1 % IJ SOLN
INTRAMUSCULAR | Status: AC
  Filled 2024-11-17: qty 20

## 2024-11-17 MED ORDER — LIDOCAINE HCL 1 % IJ SOLN
20.0000 mL | Freq: Once | INTRAMUSCULAR | Status: AC
  Administered 2024-11-17: 10 mL

## 2024-11-17 MED ORDER — CEFAZOLIN SODIUM-DEXTROSE 2-4 GM/100ML-% IV SOLN
INTRAVENOUS | Status: AC | PRN
  Administered 2024-11-17: 2 g via INTRAVENOUS

## 2024-11-17 MED ORDER — HEPARIN SODIUM (PORCINE) 1000 UNIT/ML IJ SOLN
4000.0000 [IU] | Freq: Once | INTRAMUSCULAR | Status: AC
  Administered 2024-11-17: 4.6 mL via INTRAVENOUS

## 2024-11-17 MED ORDER — HEPARIN SODIUM (PORCINE) 1000 UNIT/ML IJ SOLN
INTRAMUSCULAR | Status: AC
  Filled 2024-11-17: qty 10

## 2024-11-17 NOTE — H&P (View-Only) (Signed)
 " Progress Note   Patient: Brendan [name]. FMW:[id number] DOB: 1956-03-12 DOA: 11/11/2024     6 DOS: the patient was seen and examined on 11/17/2024   Brief hospital course: 68 year old history of Fournier's gangrene 08/2024 status post I&D drainage wound 08/30/2024 Dr. Salley was felt secondary to use of Farxiga -cultures grew strep constellatus MSSA Prevotella + beta-lactamase positive and patient was placed on Augmentin  at time of discharge EOT 09/13/2024-discharge weight 301 10/28 readmitted SOB-AKI worsening 18 pound weight gain-cardiology nephrology consulted 2/2 NYHA IV symptoms--started on IHD status post tunneled cath 09/22/2024 12/16 readmission obtunded on the floor WBC 17,000 CT contusion with TBI?  Meningioma-Rx Keppra  and was not discharged on the same as neurology did not feel this was a necessity at that time--- was discharged 12/19   Chr med h/o--OSA HTN IDDM hypothyroid HFpEF gout   12/24 came back to the emergency room feeling weak blood pressure low has been on 4 times a week HD had a fever-CBG noted to be 380 lactic acid 1.4 Tmax 101 WBC 21.6 blood pressure 90/40 nephrology consulted-crystalloid bolus fluids given Vanco Flagyl  cefepime  added 12/25 early a.m. seem to be in DKA transferred to progressive unit 2/2 concern for DKA CT head cystic hygromas 4 mm right 3 mm left trace leftward midline shift regressed not resolved inferior frontal gyrus hemorrhagic contusion--37 mm stable right meningioma 12/26 underwent HD and HD cath removed by IR  Assessment and Plan:   Sepsis secondary to MSSA and proteus mirabilis bacteremia related to HD line ID following. Continue cefepime  HD cath removed by IR on 12/26 TEE is now on hold until pt undergoes round of HD since it has been a few days since pt last had HD. Cardiology following   DM with hyperglycemia Currently on lantus  9u with SSI Recently increased lantus  to 15u Glycemic trends remain elevated. Will increase meal  coverage to 4u   Immunosuppression secondary to psoriasis follows with Dr. Dianah (dermatology) for the past 6 years Has not been able to obtain Bimzelx yet holding clobetasol  at this time   Newly declared ESRD-dialyzing via right tunneled cath placed 09/22/2024 Dialyzed 12/26 HD cath removed giving line holiday Nephrology following. Plan tunneled cath today and resumption of HD today   HFpEF NYHA IV Will not recommend SGLT2 in the future given groin wounds Amlodipine  5 held Coreg  12.5 twice daily held, torsemide  100 MT ThF held   Hypothyroidism Continue Synthroid  125   Recent TBI brain managed nonsurgically during hospitalization 12/16-12/9. 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 Victory Boers 12/26 neurosurgery --absence of mental status changes focal deficit --nothing surgically specific to do in this case   Scrotal wound in the setting of Fournier's gangrene as above Last seen [facility] surgery 10/13/2024 WOC following   BMI 36 Recommend diet/lifestyle modification Hyponatremia Corrects to 130.6 when factoring in glucose of 328 Recheck bmet in AM Thrombocytopenia Suspect related marrow suppression in the setting of presenting sepsis No active bleeding Plts trending up      Subjective: Without complaints this AM  Physical Exam: Vitals:   11/17/24 1425 11/17/24 1430 11/17/24 1445 11/17/24 1522  BP: (!) 140/80 132/77 (!) 147/89 (!) 155/75  Pulse: 73 74 72 71  Resp: 13 13 14 18   Temp:      TempSrc:      SpO2: 95% 95% 95% 94%  Weight:  General exam: Awake, laying in bed, in nad Respiratory system: Normal respiratory effort, no wheezing Cardiovascular system: regular rate, s1, s2 Gastrointestinal system: Soft, nondistended, positive BS Central nervous system: CN2-12 grossly intact, strength intact Extremities: Perfused, no clubbing Skin: Normal  skin turgor, no notable skin lesions seen Psychiatry: Mood normal // no visual hallucinations   Data Reviewed:  Labs reviewed: Na 126, K 4.3, BUN 66, Alk phos 122, AST 11, ALT 7, WBC 17.6, Hgb 9.1, Plts 93  Family Communication: Pt in room, family not at bedside  Disposition: Status is: Inpatient Remains inpatient appropriate because: severity of illness  Planned Discharge Destination: Rehab    Author: Garnette Pelt, MD 11/17/2024 3:34 PM  For on call review [email or web address]  "

## 2024-11-17 NOTE — Progress Notes (Signed)
 " Progress Note   Patient: Brendan [name]. FMW:[id number] DOB: 1956-03-12 DOA: 11/11/2024     6 DOS: the patient was seen and examined on 11/17/2024   Brief hospital course: 68 year old history of Fournier's gangrene 08/2024 status post I&D drainage wound 08/30/2024 Dr. Salley was felt secondary to use of Farxiga -cultures grew strep constellatus MSSA Prevotella + beta-lactamase positive and patient was placed on Augmentin  at time of discharge EOT 09/13/2024-discharge weight 301 10/28 readmitted SOB-AKI worsening 18 pound weight gain-cardiology nephrology consulted 2/2 NYHA IV symptoms--started on IHD status post tunneled cath 09/22/2024 12/16 readmission obtunded on the floor WBC 17,000 CT contusion with TBI?  Meningioma-Rx Keppra  and was not discharged on the same as neurology did not feel this was a necessity at that time--- was discharged 12/19   Chr med h/o--OSA HTN IDDM hypothyroid HFpEF gout   12/24 came back to the emergency room feeling weak blood pressure low has been on 4 times a week HD had a fever-CBG noted to be 380 lactic acid 1.4 Tmax 101 WBC 21.6 blood pressure 90/40 nephrology consulted-crystalloid bolus fluids given Vanco Flagyl  cefepime  added 12/25 early a.m. seem to be in DKA transferred to progressive unit 2/2 concern for DKA CT head cystic hygromas 4 mm right 3 mm left trace leftward midline shift regressed not resolved inferior frontal gyrus hemorrhagic contusion--37 mm stable right meningioma 12/26 underwent HD and HD cath removed by IR  Assessment and Plan:   Sepsis secondary to MSSA and proteus mirabilis bacteremia related to HD line ID following. Continue cefepime  HD cath removed by IR on 12/26 TEE is now on hold until pt undergoes round of HD since it has been a few days since pt last had HD. Cardiology following   DM with hyperglycemia Currently on lantus  9u with SSI Recently increased lantus  to 15u Glycemic trends remain elevated. Will increase meal  coverage to 4u   Immunosuppression secondary to psoriasis follows with Dr. Dianah (dermatology) for the past 6 years Has not been able to obtain Bimzelx yet holding clobetasol  at this time   Newly declared ESRD-dialyzing via right tunneled cath placed 09/22/2024 Dialyzed 12/26 HD cath removed giving line holiday Nephrology following. Plan tunneled cath today and resumption of HD today   HFpEF NYHA IV Will not recommend SGLT2 in the future given groin wounds Amlodipine  5 held Coreg  12.5 twice daily held, torsemide  100 MT ThF held   Hypothyroidism Continue Synthroid  125   Recent TBI brain managed nonsurgically during hospitalization 12/16-12/9. 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 Victory Boers 12/26 neurosurgery --absence of mental status changes focal deficit --nothing surgically specific to do in this case   Scrotal wound in the setting of Fournier's gangrene as above Last seen [facility] surgery 10/13/2024 WOC following   BMI 36 Recommend diet/lifestyle modification Hyponatremia Corrects to 130.6 when factoring in glucose of 328 Recheck bmet in AM Thrombocytopenia Suspect related marrow suppression in the setting of presenting sepsis No active bleeding Plts trending up      Subjective: Without complaints this AM  Physical Exam: Vitals:   11/17/24 1425 11/17/24 1430 11/17/24 1445 11/17/24 1522  BP: (!) 140/80 132/77 (!) 147/89 (!) 155/75  Pulse: 73 74 72 71  Resp: 13 13 14 18   Temp:      TempSrc:      SpO2: 95% 95% 95% 94%  Weight:  General exam: Awake, laying in bed, in nad Respiratory system: Normal respiratory effort, no wheezing Cardiovascular system: regular rate, s1, s2 Gastrointestinal system: Soft, nondistended, positive BS Central nervous system: CN2-12 grossly intact, strength intact Extremities: Perfused, no clubbing Skin: Normal  skin turgor, no notable skin lesions seen Psychiatry: Mood normal // no visual hallucinations   Data Reviewed:  Labs reviewed: Na 126, K 4.3, BUN 66, Alk phos 122, AST 11, ALT 7, WBC 17.6, Hgb 9.1, Plts 93  Family Communication: Pt in room, family not at bedside  Disposition: Status is: Inpatient Remains inpatient appropriate because: severity of illness  Planned Discharge Destination: Rehab    Author: Garnette Pelt, MD 11/17/2024 3:34 PM  For on call review [email or web address]  "

## 2024-11-17 NOTE — Plan of Care (Signed)
   Problem: Coping: Goal: Ability to adjust to condition or change in health will improve Outcome: Progressing   Problem: Fluid Volume: Goal: Ability to maintain a balanced intake and output will improve Outcome: Progressing   Problem: Health Behavior/Discharge Planning: Goal: Ability to identify and utilize available resources and services will improve Outcome: Progressing

## 2024-11-17 NOTE — Procedures (Signed)
 Interventional Radiology Procedure:   Indications: ESRD and needs new dialysis catheter  Procedure: Placement of tunneled dialysis catheter  Findings: Left jugular Palindrome (33 cm), tip in right atrium.    Complications: None     EBL: Minimal  Plan:  Tunneled catheter is ready to use.    Areona Homer R. Philip, MD  Pager: [phone]

## 2024-11-17 NOTE — Care Management Important Message (Signed)
 Important Message  Patient Details  Name: Brendan [name]. MRN: [id number] Date of Birth: Oct 12, 1956   Important Message Given:  Yes - Medicare IM     Claretta Deed 11/17/2024, 3:13 PM

## 2024-11-17 NOTE — Inpatient Diabetes Management (Signed)
 Inpatient Diabetes Program Recommendations  AACE/ADA: New Consensus Statement on Inpatient Glycemic Control (2015)  Target Ranges:  Prepandial:   less than 140 mg/dL      Peak postprandial:   less than 180 mg/dL (1-2 hours)      Critically ill patients:  140 - 180 mg/dL   Lab Results  Component Value Date   GLUCAP 298 (H) 11/17/2024   HGBA1C 8.4 (H) 08/29/2024    Review of Glycemic Control  Diabetes history: Type 2 DM Outpatient Diabetes medications: Lantus  20 units every day, Humalog  0-15 units TID, Mounjaro  15 mg qwk Current orders for Inpatient glycemic control:  Lantus  15 units every day Novolog  2 units TID Novolog  0-6 units TID  Inpatient Diabetes Program Recommendations:    Noted Lantus  increased to 15 units every day today.  If post prandials remain elevated, please consider increasing meal cover to 4 units TID (hold if he does not consumes at least 50%).  Thank you, Wyvonna Pinal, MSN, CDCES Diabetes Coordinator Inpatient Diabetes Program [phone] (team pager from 8a-5p)

## 2024-11-17 NOTE — Progress Notes (Signed)
Patient placed himself on home CPAP unit for the night.  

## 2024-11-17 NOTE — Progress Notes (Signed)
 [facility] NEPHROLOGY PROGRESS NOTE  Assessment/ Plan: Pt is a 68 y.o. yo male with AKI on HD, HTN, T2DM, recent admit for head contusion s/p fall who is being admitted with fever, leukocytosis, and weakness due to sepsis.  He has progressed to ESRD  # Sepsis due to MSSA and Proteus bacteremia: Exact source unknown but concern for [facility].   -TDC removed by IR on 12/26. -abx per ID and primary team.  Currently on cefepime , getting TEE on 12/30, ID is following. - note 11/13/24 cultures NGTD  # ESRD MTuThF - [facility]: Last HD on 12/26.   - AKI has progressed to ESRD - TDC was removed on 12/26 for line holiday.  Per ID, 72 hours of line holiday recommended.  - Will plan for dialysis today after tunneled catheter placement with IR - appreciate their assistance.  I have discussed with ID, Dr. Overton, as well who is ok with tunneled catheter placement   # Anemia of CKD:   - Continue Aranesp  100 mcg every Sat and monitor hemoglobin.  # Secondary hyperparathyroidism: phos controlled, not on binders.  on calcitriol .  # HTN/volume: optimize volume with HD and added fluid restrictions of 1.5 liters when taking PO  # Hyponatremia  - decreased free water excretion - follow with HD   Disposition - continue inpatient monitoring     Subjective:  TEE is ordered for today.  Last HD on 12/25-12/26 early AM with 2.9 kg UF - this was prior to line holiday.  There is no uop charted for 12/29.  He has some urine in his canister now.  Spoke with RN at bedside to ensure that she is aware of NPO order being for TEE and for the catheter placement.  He has used CPAP per his home regimen and took this off to speak with me.   Review of systems:  He's been NPO for procedure   Denies shortness of breath or chest pain  Denies n/v    Objective Vital signs in last 24 hours: Vitals:   11/16/24 1625 11/16/24 1959 11/16/24 2338 11/17/24 0425  BP: (!) 144/73 (!) 146/76 (!) 145/67 132/69  Pulse: 70 72 68  69  Resp: 20 16 18 18   Temp: 97.8 F (36.6 C) 98 F (36.7 C) 98 F (36.7 C) 98.1 F (36.7 C)  TempSrc: Oral  Oral   SpO2: 100% 90% 99% 100%  Weight:       Weight change:   Intake/Output Summary (Last 24 hours) at 11/17/2024 0805 Last data filed at 11/16/2024 1300 Gross per 24 hour  Intake 480 ml  Output --  Net 480 ml       Labs: RENAL PANEL Recent Labs  Lab 11/12/24 0307 11/12/24 0500 11/13/24 0426 11/14/24 0449 11/15/24 0326 11/16/24 0355 11/17/24 0246  NA 126*   < > 129* 127* 127* 126* 126*  K 4.2   < > 3.8 4.1 4.4 4.3 4.3  CL 90*   < > 94* 92* 91* 91* 92*  CO2 23   < > 22 24 25 23 23   GLUCOSE 531*   < > 218* 262* 298* 328* 265*  BUN 41*   < > 29* 44* 54* 59* 66*  CREATININE 4.49*   < > 3.18* 4.54* 5.30* 5.71* 6.18*  CALCIUM 8.3*   < > 8.2* 8.1* 8.1* 8.3* 8.2*  PHOS 4.1  --  2.4* 3.1 3.1  --   --   ALBUMIN  2.5*  --  2.3* 2.2*  2.3* 2.3* 2.3*   < > = values in this interval not displayed.    Liver Function Tests: Recent Labs  Lab 11/11/24 1120 11/12/24 0307 11/15/24 0326 11/16/24 0355 11/17/24 0246  AST 17  --   --  <10* 11*  ALT 10  --   --  6 7  ALKPHOS 116  --   --  131* 122  BILITOT 1.2  --   --  0.6 0.5  PROT 6.1*  --   --  6.1* 6.0*  ALBUMIN  2.5*   < > 2.3* 2.3* 2.3*   < > = values in this interval not displayed.   No results for input(s): LIPASE, AMYLASE in the last 168 hours. No results for input(s): AMMONIA in the last 168 hours. CBC: Recent Labs    03/10/24 1213 08/28/24 1613 09/17/24 0322 09/18/24 1541 11/13/24 0729 11/14/24 0449 11/15/24 0326 11/16/24 0355 11/17/24 0246  HGB 12.2*   < > 8.8*   < > 10.5* 9.3* 9.5* 9.6* 9.1*  MCV 83.9   < > 82.4   < > 81.8 81.4 81.9 81.0 79.9*  VITAMINB12  --   --  443  --   --   --   --   --   --   FOLATE  --   --  5.3*  --   --   --   --   --   --   FERRITIN 91  --  40  --   --   --   --   --   --   TIBC 218*  --  190*  --   --   --   --   --   --   IRON  40*  --  39*  --   --   --    --   --   --   RETICCTPCT  --   --  2.3  --   --   --   --   --   --    < > = values in this interval not displayed.    Cardiac Enzymes: No results for input(s): CKTOTAL, CKMB, CKMBINDEX, TROPONINI in the last 168 hours. CBG: Recent Labs  Lab 11/16/24 0614 11/16/24 1146 11/16/24 1718 11/16/24 2001 11/16/24 2257  GLUCAP 312* 294* 343* 278* 313*    Iron  Studies: No results for input(s): IRON , TIBC, TRANSFERRIN, FERRITIN in the last 72 hours. Studies/Results: No results found.   Medications: Infusions:  ceFEPime  (MAXIPIME ) IV 1 g (11/16/24 1033)    Scheduled Medications:  calcitRIOL   0.25 mcg Oral Daily   Chlorhexidine  Gluconate Cloth  6 each Topical Daily   Chlorhexidine  Gluconate Cloth  6 each Topical Q0600   Chlorhexidine  Gluconate Cloth  6 each Topical Q0600   darbepoetin (ARANESP ) injection - DIALYSIS  100 mcg Subcutaneous Q Sat-1800   insulin  aspart  0-6 Units Subcutaneous TID WC   insulin  aspart  2 Units Subcutaneous TID WC   insulin  glargine  15 Units Subcutaneous Daily   levothyroxine   125 mcg Oral q AM   polyethylene glycol  17 g Oral BID   simvastatin   20 mg Oral QPM   sodium chloride  flush  10-40 mL Intracatheter Q12H   sodium chloride  flush  3 mL Intravenous Q12H    have reviewed scheduled and prn medications.  Physical Exam:    General adult male in bed in no acute distress HEENT normocephalic atraumatic extraocular movements intact sclera anicteric Neck supple trachea midline Lungs  clear to auscultation bilaterally normal work of breathing at rest  Heart S1S2 no rub Abdomen soft nontender obese habitus Extremities trace edema  Psych normal mood and affect Neuro - alert and oriented x 3 provides hx and follows commands  Dialysis access - none currently    Outpatient Dialysis Orders:  MTuThF - [facility] 3hr, 400/A1.5, EDW 125kg, 3K/2.5Ca bath, TDC, heparin  5000 unit bolus - Mircera 150mcg IV q 2 weeks (100mcg given 12/5) -  Calcitriol  0.17mcg PO q HD    Niambi Smoak C Bryer Cozzolino 11/17/2024,8:05 AM  LOS: 6 days

## 2024-11-17 NOTE — Consult Note (Signed)
 "     Chief Complaint: Patient was seen in consultation today for ESRD  Referring Physician(s): Dr. Katheryn Saba  Supervising Physician: Philip Cornet  Patient Status: [facility] - In-pt  History of Present Illness: Brendan Owensby. is a 69 y.o. male with history of Fournier's gangrene 08/2024 status post I&D drainage wound 08/30/2024 readmitted 10/28 with concern for worsening renal function and underwent tunneled dialysis catheter placement 09/22/24.  He again returned to the ED 12/16  with concern for new infection, however was discussed 12/19 once improved.  Again admitted 12/24 with s/s sepsis and positive blood cultures.  His HD cath was removed and he has been on line holiday.  Request now for replacement of tunneled dialysis catheter.   Patient assessed at bedside.  He has been NPO.  He is aware of plans for replacement and is agreeable.     Past Medical History:  Diagnosis Date   Acanthosis nigricans    Atopic dermatitis    CHF (congestive heart failure) (HCC)    CKD (chronic kidney disease) stage 4, GFR 15-29 ml/min (HCC) 01/08/2023   Diabetes (HCC) 11/19/2002   Erectile dysfunction    Fatty liver 11/19/2005   Glaucoma 11/19/2006   Gynecomastia 11/20/2007   Hx of adenomatous colonic polyps 08/26/2023   Hyperaldosteronism 11/19/1998   Hypercholesterolemia    Hyperlipidemia 2010   Hypertension 1999   Hypoglycemic reaction    Hypothyroidism 11/19/2002   Incontinence    Intermittent vertigo 11/19/2010   Left cervical radiculopathy 11/19/2010   Lumbar radiculopathy    Obesity    Peripheral neuropathy    Pollen allergies 11/19/2005   perennial   Prostate cancer (HCC) 11/19/2004   Reflux esophagitis 11/19/1993   Sickle cell trait 11/19/2004   Sleep apnea, obstructive 11/19/1998   uses a cpap   Venous insufficiency 11/20/2003   Vitamin D deficiency 11/19/2010    Past Surgical History:  Procedure Laterality Date   COLONOSCOPY  2006   normal   INCISION AND DRAINAGE OF WOUND  N/A 08/28/2024   Procedure: IRRIGATION AND EXCISIONAL DEBRIDEMENT WOUND;  Surgeon: Lyndel Deward PARAS, MD;  Location: [facility] OR;  Service: General;  Laterality: N/A;  EXCISIONAL DEBRIDEMENT   INCISION AND DRAINAGE PERIRECTAL ABSCESS N/A 08/30/2024   Procedure: INCISION AND DRAINAGE OF PERINEAL WOUND;  Surgeon: Polly Cordella LABOR, MD;  Location: [facility] OR;  Service: General;  Laterality: N/A;   IR REMOVAL TUN CV CATH W/O FL  11/13/2024   IR TUNNELED CENTRAL VENOUS CATH PLC W IMG  09/18/2024   IR TUNNELED CENTRAL VENOUS CATH PLC W IMG  09/22/2024   lap band surgery  2009   left inguinal hernia repair  1998   LIPOMA EXCISION Left 04/17/2023   Procedure: MINOR EXCISION LEFT BUTTOCK SEBACEOUS CYST;  Surgeon: Lyndel Deward PARAS, MD;  Location: [facility];  Service: General;  Laterality: Left;   robotic prostatectomy  2008    Allergies: Ace inhibitors, Maxidex  [dexamethasone ], and Verapamil  Medications: Prior to Admission medications  Medication Sig Start Date End Date Taking? Authorizing Provider  acetaminophen  (TYLENOL ) 500 MG tablet Take 500 mg by mouth every 6 (six) hours as needed for mild pain.   Yes [provider]  amLODipine  (NORVASC ) 5 MG tablet Take 1 tablet (5 mg total) by mouth daily. 10/21/24  Yes   bimekizumab-bkzx (BIMZELX) 160 MG/ML prefilled syringe Inject 1 Syringe into the skin every 30 (thirty) days. 08/24/24  Yes [provider]  carvedilol  (COREG ) 12.5 MG tablet Take 1  tablet (12.5 mg total) by mouth 2 (two) times daily with a meal. Follow your blood pressure outpatient and get refills and adjustments with your PCP outpatient 09/22/24 11/11/24 Yes Perri DELENA Meliton Mickey., MD  dorzolamide  (TRUSOPT ) 2 % ophthalmic solution Place 1 drop into both eyes daily.   Yes [provider]  insulin  glargine (LANTUS  SOLOSTAR) 100 UNIT/ML Solostar Pen Inject 15 Units into the skin daily. Follow up with your PCP outpatient for further adjustment to your  regimen Patient taking differently: Inject 20 Units into the skin daily. Follow up with your PCP outpatient for further adjustment to your regimen 09/03/24  Yes Perri DELENA Meliton Mickey., MD  insulin  lispro (HUMALOG  KWIKPEN) 200 UNIT/ML KwikPen Inject 0-15 Units into the skin 3 (three) times daily with meals. CBG < 70: treat low blood sugar CBG 70 - 120: 0 units  CBG 121 - 150: 2 units  CBG 151 - 200: 3 units  CBG 201 - 250: 5 units  CBG 251 - 300: 8 units  CBG 301 - 350: 11 units  CBG 351 - 400: 15 units  CBG >400: Call MD 09/03/24  Yes Perri DELENA Meliton Mickey., MD  levothyroxine  (SYNTHROID ) 125 MCG tablet Take 1 tablet (125 mcg total) by mouth every morning on an empty stomach 06/08/24  Yes   simvastatin  (ZOCOR ) 20 MG tablet Take 1 tablet (20 mg total) by mouth every evening. 07/09/24  Yes   tirzepatide  (MOUNJARO ) 15 MG/0.5ML Pen Inject 15 mg into the skin once a week. 04/14/24  Yes   torsemide  (DEMADEX ) 100 MG tablet Take 1 tablet (100 mg total) by mouth in the morning except on dialysis days Patient taking differently: Take 100 mg by mouth See admin instructions. Take one tablet by mouth on Mondays, Tuesdays, Thursdays and Fridays only per patient 10/09/24  Yes Marlee Bernardino NOVAK, MD  clobetasol  ointment (TEMOVATE ) 0.05 % Apply topically daily before sleeping. Patient not taking: Reported on 08/29/2024 05/19/24     Continuous Glucose Sensor (FREESTYLE LIBRE 3 PLUS SENSOR) MISC Apply to upper back of arm. Change every 15 days. 04/03/24     Continuous Glucose Sensor (FREESTYLE LIBRE 3 SENSOR) MISC Apply to upper back of arm and change ever 14 days. 01/28/24   Faythe Purchase, MD  Insulin  Pen Needle (TECHLITE PEN NEEDLES) 32G X 4 MM MISC Use to inject insulin  4 times daily 10/25/22   Faythe Purchase, MD  Insulin  Pen Needle 32G X 4 MM MISC Use to inject insulin  up to 4 times daily. 12/26/23   Faythe Purchase, MD  timolol  (BETIMOL ) 0.5 % ophthalmic solution Place 1 drop into both eyes daily.    [provider]      Family History  Problem Relation Age of Onset   Heart failure Mother    Hypertension Father        deceased age 64   Heart attack Father    COPD Sister    CVA Sister    Diabetes Daughter    Colon cancer Neg Hx    Stomach cancer Neg Hx    Colon polyps Neg Hx    Esophageal cancer Neg Hx    Rectal cancer Neg Hx     Social History   Socioeconomic History   Marital status: Married    Spouse name: Not on file   Number of children: 3   Years of education: Not on file   Highest education level: Not on file  Occupational History   Occupation: truck hospital doctor  Comment: Retired futures trader  Tobacco Use   Smoking status: Never   Smokeless tobacco: Never  Vaping Use   Vaping status: Never Used  Substance and Sexual Activity   Alcohol use: Yes    Comment: one drink every few months - 1965   Drug use: No   Sexual activity: Yes  Other Topics Concern   Not on file  Social History Narrative   Right handed   Lives in a two story home    Drinks no caffeine    Social Drivers of Health   Tobacco Use: Low Risk (11/17/2024)   Patient History    Smoking Tobacco Use: Never    Smokeless Tobacco Use: Never    Passive Exposure: Not on file  Financial Resource Strain: Low Risk (01/08/2023)   Overall Financial Resource Strain (CARDIA)    Difficulty of Paying Living Expenses: Not hard at all  Food Insecurity: No Food Insecurity (11/12/2024)   Epic    Worried About Radiation Protection Practitioner of Food in the Last Year: Never true    Ran Out of Food in the Last Year: Never true  Transportation Needs: No Transportation Needs (11/12/2024)   Epic    Lack of Transportation (Medical): No    Lack of Transportation (Non-Medical): No  Physical Activity: Not on file  Stress: Not on file  Social Connections: Socially Integrated (11/12/2024)   Social Connection and Isolation Panel    Frequency of Communication with Friends and Family: More than three times a week    Frequency of Social Gatherings with  Friends and Family: More than three times a week    Attends Religious Services: More than 4 times per year    Active Member of [employer] or Organizations: Yes    Attends Banker Meetings: More than 4 times per year    Marital Status: Married  Depression (EYV7-0): Not on file  Alcohol Screen: Not on file  Housing: Low Risk (11/12/2024)   Epic    Unable to Pay for Housing in the Last Year: No    Number of Times Moved in the Last Year: 0    Homeless in the Last Year: No  Utilities: At Risk (11/12/2024)   Epic    Threatened with loss of utilities: Yes  Health Literacy: Not on file     Review of Systems: A 12 point ROS discussed and pertinent positives are indicated in the HPI above.  All other systems are negative.  Review of Systems  Constitutional:  Negative for fatigue and fever.  Respiratory:  Negative for cough and shortness of breath.   Cardiovascular:  Negative for chest pain.  Gastrointestinal:  Negative for abdominal pain.  Musculoskeletal:  Negative for back pain.  Psychiatric/Behavioral:  Negative for behavioral problems and confusion.     Vital Signs: BP (!) 141/71 (BP Location: Left Arm)   Pulse 68   Temp 97.7 F (36.5 C) (Oral)   Resp 18   Wt 281 lb 4.9 oz (127.6 kg)   SpO2 97%   BMI 36.12 kg/m   Physical Exam Vitals and nursing note reviewed.  Constitutional:      General: He is not in acute distress.    Appearance: Normal appearance. He is not ill-appearing.  HENT:     Mouth/Throat:     Mouth: Mucous membranes are moist.     Pharynx: Oropharynx is clear.  Cardiovascular:     Rate and Rhythm: Normal rate and regular rhythm.  Pulmonary:     Effort:  Pulmonary effort is normal.  Skin:    General: Skin is warm and dry.  Neurological:     General: No focal deficit present.     Mental Status: He is alert and oriented to person, place, and time. Mental status is at baseline.  Psychiatric:        Mood and Affect: Mood normal.        Behavior:  Behavior normal.        Thought Content: Thought content normal.        Judgment: Judgment normal.      MD Evaluation Airway: WNL Heart: WNL Abdomen: WNL Chest/ Lungs: WNL ASA  Classification: 3 Mallampati/Airway Score: Two   Imaging:   Labs:  CBC: Recent Labs    11/14/24 0449 11/15/24 0326 11/16/24 0355 11/17/24 0246  WBC 23.7* 22.4* 19.5* 17.6*  HGB 9.3* 9.5* 9.6* 9.1*  HCT 27.1* 27.2* 27.7* 25.9*  PLT 77* PLATELET CLUMPS NOTED ON SMEAR, UNABLE TO ESTIMATE 67* 93*    COAGS: Recent Labs    08/28/24 1613  INR 1.0    BMP: Recent Labs    11/14/24 0449 11/15/24 0326 11/16/24 0355 11/17/24 0246  NA 127* 127* 126* 126*  K 4.1 4.4 4.3 4.3  CL 92* 91* 91* 92*  CO2 24 25 23 23   GLUCOSE 262* 298* 328* 265*  BUN 44* 54* 59* 66*  CALCIUM 8.1* 8.1* 8.3* 8.2*  CREATININE 4.54* 5.30* 5.71* 6.18*  GFRNONAA 13* 11* 10* 9*    LIVER FUNCTION TESTS: Recent Labs    11/03/24 2002 11/11/24 1120 11/12/24 0307 11/14/24 0449 11/15/24 0326 11/16/24 0355 11/17/24 0246  BILITOT 0.7 1.2  --   --   --  0.6 0.5  AST 30 17  --   --   --  <10* 11*  ALT 16 10  --   --   --  6 7  ALKPHOS 108 116  --   --   --  131* 122  PROT 7.2 6.1*  --   --   --  6.1* 6.0*  ALBUMIN  3.5 2.5*   < > 2.2* 2.3* 2.3* 2.3*   < > = values in this interval not displayed.    TUMOR MARKERS: No results for input(s): AFPTM, CEA, CA199, CHROMGRNA in the last 8760 hours.  Assessment and Plan: End stage renal disease on HD via right tunneled dialysis. Cathter recently removed 12/26 for line holiday and now in need of replacement Patient in need of HD cath replacement prior to TEE tomorrow.  Line removed 12/26 and culture have thus far remained negative with improvement in his WBC to 17.6.   Catheter placement option discussed between Nephrology, Cardiology, and ID who are all in agreement for replacement of tunneled catheter.   Risks and benefits discussed with the patient including, but  not limited to bleeding, infection, vascular injury, pneumothorax which may require chest tube placement, air embolism or even death  All of the patient's questions were answered, patient is agreeable to proceed. Consent signed and in chart.  Thank you for this interesting consult.  I greatly enjoyed meeting Brendan [name]. and look forward to participating in their care.  A copy of this report was sent to the requesting provider on this date.  Electronically Signed: Claudell Rhody Sue-Ellen Tiandra Swoveland, PA 11/17/2024, 1:13 PM   I spent a total of 20 Minutes    in face to face in clinical consultation, greater than 50% of which was counseling/coordinating care for ESRD on HD,  bacteremia.   "

## 2024-11-18 ENCOUNTER — Encounter (HOSPITAL_COMMUNITY)
Admission: EM | Disposition: A | Discharge status: Rehabilitation Facility | Payer: Self-pay | Source: Home / Self Care | Attending: Internal Medicine

## 2024-11-18 ENCOUNTER — Inpatient Hospital Stay (HOSPITAL_COMMUNITY): Payer: Self-pay | Admitting: Anesthesiology

## 2024-11-18 ENCOUNTER — Encounter (HOSPITAL_COMMUNITY): Payer: Self-pay | Admitting: Internal Medicine

## 2024-11-18 ENCOUNTER — Inpatient Hospital Stay (HOSPITAL_COMMUNITY)

## 2024-11-18 DIAGNOSIS — R7881 Bacteremia: Secondary | ICD-10-CM

## 2024-11-18 DIAGNOSIS — I132 Hypertensive heart and chronic kidney disease with heart failure and with stage 5 chronic kidney disease, or end stage renal disease: Secondary | ICD-10-CM | POA: Diagnosis not present

## 2024-11-18 DIAGNOSIS — B9561 Methicillin susceptible Staphylococcus aureus infection as the cause of diseases classified elsewhere: Secondary | ICD-10-CM

## 2024-11-18 DIAGNOSIS — T80211A Bloodstream infection due to central venous catheter, initial encounter: Secondary | ICD-10-CM | POA: Diagnosis not present

## 2024-11-18 DIAGNOSIS — J90 Pleural effusion, not elsewhere classified: Secondary | ICD-10-CM

## 2024-11-18 DIAGNOSIS — L409 Psoriasis, unspecified: Secondary | ICD-10-CM

## 2024-11-18 DIAGNOSIS — N186 End stage renal disease: Secondary | ICD-10-CM

## 2024-11-18 DIAGNOSIS — I5033 Acute on chronic diastolic (congestive) heart failure: Secondary | ICD-10-CM

## 2024-11-18 DIAGNOSIS — R509 Fever, unspecified: Principal | ICD-10-CM

## 2024-11-18 DIAGNOSIS — B964 Proteus (mirabilis) (morganii) as the cause of diseases classified elsewhere: Secondary | ICD-10-CM | POA: Diagnosis not present

## 2024-11-18 DIAGNOSIS — Z1612 Extended spectrum beta lactamase (ESBL) resistance: Secondary | ICD-10-CM

## 2024-11-18 DIAGNOSIS — Z992 Dependence on renal dialysis: Secondary | ICD-10-CM | POA: Diagnosis not present

## 2024-11-18 HISTORY — PX: TRANSESOPHAGEAL ECHOCARDIOGRAM (CATH LAB): EP1270

## 2024-11-18 LAB — CULTURE, BLOOD (ROUTINE X 2)
Culture: NO GROWTH
Culture: NO GROWTH

## 2024-11-18 LAB — CBC WITH DIFFERENTIAL/PLATELET
Abs Immature Granulocytes: 0.49 K/uL — ABNORMAL HIGH (ref 0.00–0.07)
Basophils Absolute: 0.1 K/uL (ref 0.0–0.1)
Basophils Relative: 1 %
Eosinophils Absolute: 0.4 K/uL (ref 0.0–0.5)
Eosinophils Relative: 2 %
HCT: 26.9 % — ABNORMAL LOW (ref 39.0–52.0)
Hemoglobin: 9.6 g/dL — ABNORMAL LOW (ref 13.0–17.0)
Immature Granulocytes: 3 %
Lymphocytes Relative: 10 %
Lymphs Abs: 1.8 K/uL (ref 0.7–4.0)
MCH: 28.1 pg (ref 26.0–34.0)
MCHC: 35.7 g/dL (ref 30.0–36.0)
MCV: 78.7 fL — ABNORMAL LOW (ref 80.0–100.0)
Monocytes Absolute: 2.4 K/uL — ABNORMAL HIGH (ref 0.1–1.0)
Monocytes Relative: 13 %
Neutro Abs: 12.9 K/uL — ABNORMAL HIGH (ref 1.7–7.7)
Neutrophils Relative %: 71 %
Platelets: 125 K/uL — ABNORMAL LOW (ref 150–400)
RBC: 3.42 MIL/uL — ABNORMAL LOW (ref 4.22–5.81)
RDW: 16.6 % — ABNORMAL HIGH (ref 11.5–15.5)
WBC: 18 K/uL — ABNORMAL HIGH (ref 4.0–10.5)
nRBC: 0 % (ref 0.0–0.2)

## 2024-11-18 LAB — GLUCOSE, CAPILLARY
Glucose-Capillary: 284 mg/dL — ABNORMAL HIGH (ref 70–99)
Glucose-Capillary: 226 mg/dL — ABNORMAL HIGH (ref 70–99)
Glucose-Capillary: 223 mg/dL — ABNORMAL HIGH (ref 70–99)
Glucose-Capillary: 213 mg/dL — ABNORMAL HIGH (ref 70–99)

## 2024-11-18 LAB — COMPREHENSIVE METABOLIC PANEL WITH GFR
ALT: 6 U/L (ref 0–44)
AST: 11 U/L — ABNORMAL LOW (ref 15–41)
Albumin: 2.4 g/dL — ABNORMAL LOW (ref 3.5–5.0)
Alkaline Phosphatase: 124 U/L (ref 38–126)
Anion gap: 14 (ref 5–15)
BUN: 40 mg/dL — ABNORMAL HIGH (ref 8–23)
CO2: 23 mmol/L (ref 22–32)
Calcium: 8 mg/dL — ABNORMAL LOW (ref 8.9–10.3)
Chloride: 92 mmol/L — ABNORMAL LOW (ref 98–111)
Creatinine, Ser: 4.09 mg/dL — ABNORMAL HIGH (ref 0.61–1.24)
GFR, Estimated: 15 mL/min — ABNORMAL LOW
Glucose, Bld: 240 mg/dL — ABNORMAL HIGH (ref 70–99)
Potassium: 4 mmol/L (ref 3.5–5.1)
Sodium: 129 mmol/L — ABNORMAL LOW (ref 135–145)
Total Bilirubin: 0.6 mg/dL (ref 0.0–1.2)
Total Protein: 6.3 g/dL — ABNORMAL LOW (ref 6.5–8.1)

## 2024-11-18 LAB — ECHO TEE

## 2024-11-18 SURGERY — TRANSESOPHAGEAL ECHOCARDIOGRAM (TEE) (CATHLAB)
Anesthesia: Monitor Anesthesia Care

## 2024-11-18 MED ORDER — LACTULOSE 10 GM/15ML PO SOLN
20.0000 g | Freq: Two times a day (BID) | ORAL | Status: DC | PRN
  Administered 2024-11-18: 20 g via ORAL
  Filled 2024-11-18: qty 30

## 2024-11-18 MED ORDER — SODIUM CHLORIDE 0.9 % IV SOLN: INTRAVENOUS | Status: DC | PRN

## 2024-11-18 MED ORDER — PROPOFOL 10 MG/ML IV BOLUS
INTRAVENOUS | Status: DC | PRN
  Administered 2024-11-18: 80 ug/kg/min via INTRAVENOUS

## 2024-11-18 MED ORDER — FENTANYL CITRATE (PF) 100 MCG/2ML IJ SOLN
INTRAMUSCULAR | Status: AC
  Filled 2024-11-18: qty 2

## 2024-11-18 MED ORDER — HEPARIN SODIUM (PORCINE) 1000 UNIT/ML DIALYSIS
1000.0000 [IU] | INTRAMUSCULAR | Status: DC | PRN
  Administered 2024-11-18: 1000 [IU]

## 2024-11-18 MED ORDER — FENTANYL CITRATE (PF) 100 MCG/2ML IJ SOLN
INTRAMUSCULAR | Status: DC | PRN
  Administered 2024-11-18: 25 ug via INTRAVENOUS

## 2024-11-18 MED ORDER — SODIUM CHLORIDE 0.9 % IV SOLN
2.0000 g | INTRAVENOUS | Status: DC
  Administered 2024-11-19 – 2024-11-21 (×2): 2 g via INTRAVENOUS
  Filled 2024-11-18 (×2): qty 12.5

## 2024-11-18 MED ORDER — CHLORHEXIDINE GLUCONATE CLOTH 2 % EX PADS
6.0000 | MEDICATED_PAD | Freq: Every day | CUTANEOUS | Status: DC
  Administered 2024-11-19: 6 via TOPICAL

## 2024-11-18 MED ORDER — CEFEPIME IV (FOR PTA / DISCHARGE USE ONLY): 1.0000 g | INTRAVENOUS | Status: DC | PRN

## 2024-11-18 MED ORDER — HEPARIN SODIUM (PORCINE) 1000 UNIT/ML IJ SOLN
INTRAMUSCULAR | Status: AC
  Filled 2024-11-18: qty 5

## 2024-11-18 NOTE — Progress Notes (Signed)
 [facility] NEPHROLOGY PROGRESS NOTE  Assessment/ Plan: Pt is a 68 y.o. yo male with AKI on HD, HTN, T2DM, recent admit for head contusion s/p fall who is being admitted with fever, leukocytosis, and weakness due to sepsis.  He has progressed to ESRD  # Sepsis due to MSSA and Proteus bacteremia: Exact source unknown but concern for [facility].   -TDC removed by IR on 12/26 -abx per ID and primary team.  Currently on cefepime , getting TEE on 12/31, ID is following. - note 11/13/24 cultures negative  # ESRD MTuThF - [facility]: Last HD on 12/26.   - note that his AKI has progressed to ESRD - [facility] was removed on 12/26 for line holiday.  Tunneled catheter replaced on 12/30 after line holiday - s/p HD earlier today/overnight - Plan for HD on Thursday then Sat    # Anemia of CKD:   - Continue Aranesp  100 mcg every Sat and monitor hemoglobin.  # Secondary hyperparathyroidism: phos controlled, not on binders.  on calcitriol .  # HTN/volume: optimize volume with HD and recommend fluid restrictions of 1.5 liters/day when taking PO  # Hyponatremia  - decreased free water excretion - improved with HD   Disposition - continue inpatient monitoring     Subjective:    TEE is ordered for today.  Last HD overnight - ended earlier this AM.  Had 3 kg UF - this was prior to line holiday.  There is 375 mL uop charted for 12/30.  He feels ok.  Had his CPAP on for a nap before I came in.   Review of systems:    He's been NPO for procedure   Denies shortness of breath or chest pain  Denies n/v    Objective Vital signs in last 24 hours: Vitals:   11/18/24 0731 11/18/24 0839 11/18/24 0858 11/18/24 0952  BP: 128/74 111/68 116/62 120/68  Pulse: 75 72 73 76  Resp: 15 12 12 14   Temp:  97.8 F (36.6 C) 97.8 F (36.6 C) 98 F (36.7 C)  TempSrc:    Oral  SpO2: 95% 95% 96% 96%  Weight:   123.8 kg   Height:       Weight change:   Intake/Output Summary (Last 24 hours) at 11/18/2024 1203 Last  data filed at 11/18/2024 0858 Gross per 24 hour  Intake 300 ml  Output 3375 ml  Net -3075 ml     Labs: RENAL PANEL Recent Labs  Lab 11/12/24 0307 11/12/24 0500 11/13/24 0426 11/14/24 0449 11/15/24 0326 11/16/24 0355 11/17/24 0246 11/18/24 1018  NA 126*   < > 129* 127* 127* 126* 126* 129*  K 4.2   < > 3.8 4.1 4.4 4.3 4.3 4.0  CL 90*   < > 94* 92* 91* 91* 92* 92*  CO2 23   < > 22 24 25 23 23 23   GLUCOSE 531*   < > 218* 262* 298* 328* 265* 240*  BUN 41*   < > 29* 44* 54* 59* 66* 40*  CREATININE 4.49*   < > 3.18* 4.54* 5.30* 5.71* 6.18* 4.09*  CALCIUM 8.3*   < > 8.2* 8.1* 8.1* 8.3* 8.2* 8.0*  PHOS 4.1  --  2.4* 3.1 3.1  --   --   --   ALBUMIN  2.5*  --  2.3* 2.2* 2.3* 2.3* 2.3* 2.4*   < > = values in this interval not displayed.    Liver Function Tests: Recent Labs  Lab 11/16/24 0355 11/17/24 0246  11/18/24 1018  AST <10* 11* 11*  ALT 6 7 6   ALKPHOS 131* 122 124  BILITOT 0.6 0.5 0.6  PROT 6.1* 6.0* 6.3*  ALBUMIN  2.3* 2.3* 2.4*   No results for input(s): LIPASE, AMYLASE in the last 168 hours. No results for input(s): AMMONIA in the last 168 hours. CBC: Recent Labs    03/10/24 1213 08/28/24 1613 09/17/24 0322 09/18/24 1541 11/14/24 0449 11/15/24 0326 11/16/24 0355 11/17/24 0246 11/18/24 1019  HGB 12.2*   < > 8.8*   < > 9.3* 9.5* 9.6* 9.1* 9.6*  MCV 83.9   < > 82.4   < > 81.4 81.9 81.0 79.9* 78.7*  VITAMINB12  --   --  443  --   --   --   --   --   --   FOLATE  --   --  5.3*  --   --   --   --   --   --   FERRITIN 91  --  40  --   --   --   --   --   --   TIBC 218*  --  190*  --   --   --   --   --   --   IRON  40*  --  39*  --   --   --   --   --   --   RETICCTPCT  --   --  2.3  --   --   --   --   --   --    < > = values in this interval not displayed.    Cardiac Enzymes: No results for input(s): CKTOTAL, CKMB, CKMBINDEX, TROPONINI in the last 168 hours. CBG: Recent Labs  Lab 11/17/24 1520 11/17/24 1611 11/17/24 2058 11/18/24 0406  11/18/24 0950  GLUCAP 275* 263* 239* 284* 226*    Iron  Studies: No results for input(s): IRON , TIBC, TRANSFERRIN, FERRITIN in the last 72 hours. Studies/Results: IR TUNNELED CENTRAL VENOUS CATH [facility] IMG Result Date: 11/17/2024 INDICATION: Right jugular dialysis catheter was recently removed for a line holiday. Patient needs a new dialysis access. EXAM: FLUOROSCOPIC AND ULTRASOUND GUIDED PLACEMENT OF A TUNNELED DIALYSIS CATHETER Physician: Juliene SAUNDERS. Philip, MD MEDICATIONS: Ancef  2 g; The antibiotic was administered within an appropriate time interval prior to skin puncture. ANESTHESIA/SEDATION: Moderate (conscious) sedation was employed during this procedure. A total of Versed  1 mg and fentanyl  50 mcg was administered intravenously at the order of the provider performing the procedure. Total intra-service moderate sedation time: 24 minutes. Patient's level of consciousness and vital signs were monitored continuously by radiology nurse throughout the procedure under the supervision of the provider performing the procedure. FLUOROSCOPY TIME:  Radiation Exposure Index (as provided by the fluoroscopic device): 26 mGy Kerma COMPLICATIONS: None immediate. PROCEDURE: Informed consent was obtained for placement of a tunneled dialysis catheter. The patient was placed supine on the interventional table. Ultrasound confirmed a patent left internal jugular vein. Ultrasound image obtained for documentation. The left neck and chest was prepped and draped in a sterile fashion. Maximal barrier sterile technique was utilized including caps, mask, sterile gowns, sterile gloves, sterile drape, hand hygiene and skin antiseptic. The left neck was anesthetized with 1% lidocaine . A small incision was made with #11 blade scalpel. A 21 gauge needle directed into the left internal jugular vein with ultrasound guidance. A micropuncture dilator set was placed. A 33 cm tip to cuff Palindrome catheter was selected. The skin below  the left clavicle was anesthetized and a small incision was made with an #11 blade scalpel. A subcutaneous tunnel was formed to the vein dermatotomy site. The catheter was brought through the tunnel. The vein dermatotomy site was dilated to accommodate a peel-away sheath over a wire. The catheter was placed through the peel-away sheath and directed into the central venous structures. The tip of the catheter was placed in the right atrium with fluoroscopy. Fluoroscopic images were obtained for documentation. Both lumens were found to aspirate and flush well. The proper amount of heparin  was flushed in both lumens. The vein dermatotomy site was closed using a single layer of absorbable suture and Dermabond. Gel-Foam was placed in the subcutaneous tract. The catheter was secured to the skin using Prolene suture. IMPRESSION: Successful placement of a left jugular tunneled dialysis catheter using ultrasound and fluoroscopic guidance. Electronically Signed   By: Juliene Balder M.D.   On: 11/17/2024 20:16     Medications: Infusions:   ceFAZolin  (ANCEF ) IV     ceFEPime  (MAXIPIME ) IV 1 g (11/18/24 1022)    Scheduled Medications:  calcitRIOL   0.25 mcg Oral Daily   Chlorhexidine  Gluconate Cloth  6 each Topical Daily   Chlorhexidine  Gluconate Cloth  6 each Topical Q0600   Chlorhexidine  Gluconate Cloth  6 each Topical Q0600   darbepoetin (ARANESP ) injection - DIALYSIS  100 mcg Subcutaneous Q Sat-1800   insulin  aspart  0-6 Units Subcutaneous TID WC   insulin  aspart  4 Units Subcutaneous TID WC   insulin  glargine  15 Units Subcutaneous Daily   levothyroxine   125 mcg Oral q AM   polyethylene glycol  17 g Oral BID   simvastatin   20 mg Oral QPM   sodium chloride  flush  10-40 mL Intracatheter Q12H   sodium chloride  flush  3 mL Intravenous Q12H    have reviewed scheduled and prn medications.  Physical Exam:     General adult male in bed in no acute distress HEENT normocephalic atraumatic extraocular movements  intact sclera anicteric Neck supple trachea midline Lungs clear to auscultation bilaterally normal work of breathing at rest  Heart S1S2 no rub Abdomen soft nontender obese habitus Extremities trace edema  Psych normal mood and affect Neuro - alert and oriented x 3 provides hx and follows commands  Dialysis access -left internal jugular tunneled catheter in place; bleeding contained within bandage   Outpatient Dialysis Orders:  MTuThF - [facility] 3hr, 400/A1.5, EDW 125kg, 3K/2.5Ca bath, TDC, heparin  5000 unit bolus - Mircera 150mcg IV q 2 weeks (100mcg given 12/5) - Calcitriol  0.76mcg PO q HD    Wahid Holley C Maelle Sheaffer 11/18/2024,12:17 PM  LOS: 7 days

## 2024-11-18 NOTE — Plan of Care (Signed)
  Problem: Education: Goal: Ability to describe self-care measures that may prevent or decrease complications (Diabetes Survival Skills Education) will improve Outcome: Progressing Goal: Individualized Educational Video(s) Outcome: Progressing   Problem: Coping: Goal: Ability to adjust to condition or change in health will improve Outcome: Progressing   Problem: Fluid Volume: Goal: Ability to maintain a balanced intake and output will improve Outcome: Progressing   Problem: Health Behavior/Discharge Planning: Goal: Ability to identify and utilize available resources and services will improve Outcome: Progressing Goal: Ability to manage health-related needs will improve Outcome: Progressing   Problem: Metabolic: Goal: Ability to maintain appropriate glucose levels will improve Outcome: Progressing   Problem: Nutritional: Goal: Maintenance of adequate nutrition will improve Outcome: Progressing Goal: Progress toward achieving an optimal weight will improve Outcome: Progressing   Problem: Skin Integrity: Goal: Risk for impaired skin integrity will decrease Outcome: Progressing   Problem: Tissue Perfusion: Goal: Adequacy of tissue perfusion will improve Outcome: Progressing   Problem: Education: Goal: Knowledge of General Education information will improve Description: Including pain rating scale, medication(s)/side effects and non-pharmacologic comfort measures Outcome: Progressing   Problem: Health Behavior/Discharge Planning: Goal: Ability to manage health-related needs will improve Outcome: Progressing   Problem: Clinical Measurements: Goal: Ability to maintain clinical measurements within normal limits will improve Outcome: Progressing Goal: Will remain free from infection Outcome: Progressing Goal: Diagnostic test results will improve Outcome: Progressing Goal: Respiratory complications will improve Outcome: Progressing Goal: Cardiovascular complication will  be avoided Outcome: Progressing   Problem: Activity: Goal: Risk for activity intolerance will decrease Outcome: Progressing   Problem: Nutrition: Goal: Adequate nutrition will be maintained Outcome: Progressing   Problem: Coping: Goal: Level of anxiety will decrease Outcome: Progressing   Problem: Elimination: Goal: Will not experience complications related to bowel motility Outcome: Progressing Goal: Will not experience complications related to urinary retention Outcome: Progressing   Problem: Pain Managment: Goal: General experience of comfort will improve and/or be controlled Outcome: Progressing   Problem: Safety: Goal: Ability to remain free from injury will improve Outcome: Progressing   Problem: Skin Integrity: Goal: Risk for impaired skin integrity will decrease Outcome: Progressing   Problem: Education: Goal: Ability to describe self-care measures that may prevent or decrease complications (Diabetes Survival Skills Education) will improve Outcome: Progressing Goal: Individualized Educational Video(s) Outcome: Progressing   Problem: Cardiac: Goal: Ability to maintain an adequate cardiac output will improve Outcome: Progressing   Problem: Health Behavior/Discharge Planning: Goal: Ability to identify and utilize available resources and services will improve Outcome: Progressing Goal: Ability to manage health-related needs will improve Outcome: Progressing   Problem: Fluid Volume: Goal: Ability to achieve a balanced intake and output will improve Outcome: Progressing   Problem: Metabolic: Goal: Ability to maintain appropriate glucose levels will improve Outcome: Progressing   Problem: Nutritional: Goal: Maintenance of adequate nutrition will improve Outcome: Progressing Goal: Maintenance of adequate weight for body size and type will improve Outcome: Progressing   Problem: Respiratory: Goal: Will regain and/or maintain adequate  ventilation Outcome: Progressing   Problem: Urinary Elimination: Goal: Ability to achieve and maintain adequate renal perfusion and functioning will improve Outcome: Progressing

## 2024-11-18 NOTE — Interval H&P Note (Signed)
 History and Physical Interval Note:  11/18/2024 1:54 PM  Brendan [name].  has presented today for surgery, with the diagnosis of bacteremia.  The various methods of treatment have been discussed with the patient and family. After consideration of risks, benefits and other options for treatment, the patient has consented to  Procedures: TRANSESOPHAGEAL ECHOCARDIOGRAM (N/A) as a surgical intervention.  The patient's history has been reviewed, patient examined, no change in status, stable for surgery.  I have reviewed the patient's chart and labs.  Questions were answered to the patient's satisfaction.     Carnie Bruemmer A Richard Ritchey

## 2024-11-18 NOTE — Progress Notes (Signed)
" °   11/18/24 0858  Vitals  Temp 97.8 F (36.6 C)  Pulse Rate 73  Resp 12  BP 116/62  SpO2 96 %  O2 Device Room Air  Weight 123.8 kg  Oxygen Therapy  Patient Activity (if Appropriate) In bed  Pulse Oximetry Type Continuous  Oximetry Probe Site Changed No  Post Treatment  Dialyzer Clearance Lightly streaked  Hemodialysis Intake (mL) 0 mL  Liters Processed 73.5  Fluid Removed (mL) 3000 mL  Tolerated HD Treatment Yes   Received patient in bed to unit.  Alert and oriented.  Informed consent signed and in chart.   TX duration: 3.5  Patient tolerated well.  Transported back to the room  Alert, without acute distress.  Hand-off given to patient's nurse.   Access used: Yes Access issues: No  Total UF removed: 3000 Medication(s) given: See MAR Post HD VS: See Above Grid Post HD weight: 123.8 kg   [name] "

## 2024-11-18 NOTE — Progress Notes (Cosign Needed Addendum)
 "        [facility]  Date of Admission:  11/11/2024      Total days of antibiotics 7   Cefepime          ASSESSMENT: Brendan [name]. is a 68 y.o. male admitted with:   MSSA bacteremia - Proteus bacteremia - HD catheter infection - Blood cultures positive on the 24th with clearance documented on the 26th HD line was removed on the 26th and replaced on the 30th.  He continues to do well on cefepime  for dual coverage.No other signs of metastatic infection concern after review and exam today.  I informed him we are just waiting for the TEE and then should be able to come up with final recommendations regarding his treatment.  His TEE has been arranged for today tentatively.   Psoriasis -  Very dry skin with multiple scabs and abrasions noted nothing acutely infected and everything appears dry and healing  End-stage dialysis on HD via line -  Line has been replaced after a holiday.  We will come up with a dialysis amenable regimen  Isolation recommendations -  Standard precautions  PLAN: Continue cefepime  Follow-up TEE results if that study can be done today  ADDENDUM:  [place] Cardiology team and TEE was negative for vegetations.  Please continue 1/11 to complete 2 weeks following line removal. Final recs for cefepime  after HD sessions (4x/week) EOT 1/09 - d/w nephrology team.   ID will sign off - please call back with any questions/concerns or if we can be of further assistance.     Principal Problem:   Sepsis (HCC) Active Problems:   Insulin -requiring or dependent type II diabetes mellitus (HCC)   OSA (obstructive sleep apnea)   Hypothyroidism   ESRD on dialysis (HCC)   Hyperlipidemia associated with type 2 diabetes mellitus (HCC)   Anemia of chronic disease   Hypotension   History of traumatic brain injury   Heart failure with preserved ejection fraction (HCC)   Open wound    calcitRIOL   0.25 mcg Oral Daily   Chlorhexidine  Gluconate  Cloth  6 each Topical Daily   Chlorhexidine  Gluconate Cloth  6 each Topical Q0600   Chlorhexidine  Gluconate Cloth  6 each Topical Q0600   darbepoetin (ARANESP ) injection - DIALYSIS  100 mcg Subcutaneous Q Sat-1800   insulin  aspart  0-6 Units Subcutaneous TID WC   insulin  aspart  4 Units Subcutaneous TID WC   insulin  glargine  15 Units Subcutaneous Daily   levothyroxine   125 mcg Oral q AM   polyethylene glycol  17 g Oral BID   simvastatin   20 mg Oral QPM   sodium chloride  flush  10-40 mL Intracatheter Q12H   sodium chloride  flush  3 mL Intravenous Q12H    SUBJECTIVE: Hungry.  No other concerns or complaints.  No changes in musculoskeletal assessment  Review of Systems: ROS  Allergies[1]  OBJECTIVE: Vitals:   11/18/24 0731 11/18/24 0839 11/18/24 0858 11/18/24 0952  BP: 128/74 111/68 116/62 120/68  Pulse: 75 72 73 76  Resp: 15 12 12 14   Temp:  97.8 F (36.6 C) 97.8 F (36.6 C) 98 F (36.7 C)  TempSrc:    Oral  SpO2: 95% 95% 96% 96%  Weight:   123.8 kg   Height:       Body mass index is 35.04 kg/m.  Physical Exam Constitutional:      Appearance: Normal appearance. He is not ill-appearing.  HENT:  Head: Normocephalic.     Mouth/Throat:     Mouth: Mucous membranes are moist.     Pharynx: Oropharynx is clear.  Eyes:     General: No scleral icterus. Cardiovascular:     Rate and Rhythm: Normal rate.  Pulmonary:     Effort: Pulmonary effort is normal.  Musculoskeletal:        General: Normal range of motion.     Cervical back: Normal range of motion.  Skin:    Coloration: Skin is not jaundiced or pale.  Neurological:     Mental Status: He is alert and oriented to person, place, and time.  Psychiatric:        Mood and Affect: Mood normal.        Judgment: Judgment normal.     Lab Results Lab Results  Component Value Date   WBC 18.0 (H) 11/18/2024   HGB 9.6 (L) 11/18/2024   HCT 26.9 (L) 11/18/2024   MCV 78.7 (L) 11/18/2024   PLT 125 (L) 11/18/2024     Lab Results  Component Value Date   CREATININE 4.09 (H) 11/18/2024   BUN 40 (H) 11/18/2024   NA 129 (L) 11/18/2024   K 4.0 11/18/2024   CL 92 (L) 11/18/2024   CO2 23 11/18/2024    Lab Results  Component Value Date   ALT 6 11/18/2024   AST 11 (L) 11/18/2024   ALKPHOS 124 11/18/2024   BILITOT 0.6 11/18/2024     Microbiology: Recent Results (from the past 240 hours)  Resp panel by RT-PCR (RSV, Flu A&B, Covid) Anterior Nasal Swab     Status: None   Collection Time: 11/11/24 11:25 AM   Specimen: Anterior Nasal Swab  Result Value Ref Range Status   SARS Coronavirus 2 by RT PCR NEGATIVE NEGATIVE Final   Influenza A by PCR NEGATIVE NEGATIVE Final   Influenza B by PCR NEGATIVE NEGATIVE Final    Comment: (NOTE) The Xpert Xpress SARS-CoV-2/FLU/RSV plus assay is intended as an aid in the diagnosis of influenza from Nasopharyngeal swab specimens and should not be used as a sole basis for treatment. Nasal washings and aspirates are unacceptable for Xpert Xpress SARS-CoV-2/FLU/RSV testing.  Fact Sheet for Patients: bloggercourse.com  Fact Sheet for Healthcare Providers: seriousbroker.it  This test is not yet approved or cleared by the [place]  FDA and has been authorized for detection and/or diagnosis of SARS-CoV-2 by FDA under an Emergency Use Authorization (EUA). This EUA will remain in effect (meaning this test can be used) for the duration of the COVID-19 declaration under Section 564(b)(1) of the Act, 21 U.S.C. section 360bbb-3(b)(1), unless the authorization is terminated or revoked.     Resp Syncytial Virus by PCR NEGATIVE NEGATIVE Final    Comment: (NOTE) Fact Sheet for Patients: bloggercourse.com  Fact Sheet for Healthcare Providers: seriousbroker.it  This test is not yet approved or cleared by the [place]  FDA and has been authorized for detection and/or  diagnosis of SARS-CoV-2 by FDA under an Emergency Use Authorization (EUA). This EUA will remain in effect (meaning this test can be used) for the duration of the COVID-19 declaration under Section 564(b)(1) of the Act, 21 U.S.C. section 360bbb-3(b)(1), unless the authorization is terminated or revoked.  Performed at [facility], [address], [place], [place] [address]   Blood culture (routine x 2)     Status: Abnormal   Collection Time: 11/11/24  1:29 PM   Specimen: BLOOD  Result Value Ref Range Status  Specimen Description BLOOD SITE NOT SPECIFIED  Final   Special Requests   Final    BOTTLES DRAWN AEROBIC AND ANAEROBIC Blood Culture results may not be optimal due to an inadequate volume of blood received in culture bottles   Culture  Setup Time   Final    GRAM POSITIVE COCCI IN CLUSTERS GRAM NEGATIVE RODS IN BOTH AEROBIC AND ANAEROBIC BOTTLES CRITICAL RESULT CALLED TO, READ BACK BY AND VERIFIED WITH: PHARMD G ABBOTT 11/12/2024 @ 0347 BY AB Performed at [facility], [address], [place], [place] [address]    Culture PROTEUS MIRABILIS STAPHYLOCOCCUS AUREUS  (A)  Final   Report Status 11/15/2024 FINAL  Final   Organism ID, Bacteria PROTEUS MIRABILIS  Final   Organism ID, Bacteria STAPHYLOCOCCUS AUREUS  Final      Susceptibility   Proteus mirabilis - MIC*    AMPICILLIN  <=2 SENSITIVE Sensitive     CEFAZOLIN  (NON-URINE) 4 INTERMEDIATE Intermediate     CEFEPIME  <=0.12 SENSITIVE Sensitive     ERTAPENEM <=0.12 SENSITIVE Sensitive     CEFTRIAXONE  <=0.25 SENSITIVE Sensitive     CIPROFLOXACIN <=0.06 SENSITIVE Sensitive     GENTAMICIN <=1 SENSITIVE Sensitive     MEROPENEM 1 SENSITIVE Sensitive     TRIMETH/SULFA <=20 SENSITIVE Sensitive     AMPICILLIN /SULBACTAM <=2 SENSITIVE Sensitive     PIP/TAZO Value in next row Sensitive      <=4 SENSITIVEThis is a modified FDA-approved test that has been validated and its performance characteristics determined by the reporting  laboratory.  This laboratory is certified under the Clinical Laboratory Improvement Amendments CLIA as qualified to perform high complexity clinical laboratory testing.    * PROTEUS MIRABILIS   Staphylococcus aureus - MIC*    CIPROFLOXACIN Value in next row Sensitive      <=4 SENSITIVEThis is a modified FDA-approved test that has been validated and its performance characteristics determined by the reporting laboratory.  This laboratory is certified under the Clinical Laboratory Improvement Amendments CLIA as qualified to perform high complexity clinical laboratory testing.    ERYTHROMYCIN Value in next row Resistant      <=4 SENSITIVEThis is a modified FDA-approved test that has been validated and its performance characteristics determined by the reporting laboratory.  This laboratory is certified under the Clinical Laboratory Improvement Amendments CLIA as qualified to perform high complexity clinical laboratory testing.    GENTAMICIN Value in next row Sensitive      <=4 SENSITIVEThis is a modified FDA-approved test that has been validated and its performance characteristics determined by the reporting laboratory.  This laboratory is certified under the Clinical Laboratory Improvement Amendments CLIA as qualified to perform high complexity clinical laboratory testing.    OXACILLIN Value in next row Sensitive      <=4 SENSITIVEThis is a modified FDA-approved test that has been validated and its performance characteristics determined by the reporting laboratory.  This laboratory is certified under the Clinical Laboratory Improvement Amendments CLIA as qualified to perform high complexity clinical laboratory testing.    TETRACYCLINE Value in next row Resistant      <=4 SENSITIVEThis is a modified FDA-approved test that has been validated and its performance characteristics determined by the reporting laboratory.  This laboratory is certified under the Clinical Laboratory Improvement Amendments CLIA as  qualified to perform high complexity clinical laboratory testing.    VANCOMYCIN  Value in next row Sensitive      <=4 SENSITIVEThis is a modified FDA-approved test that has been validated and its  performance characteristics determined by the reporting laboratory.  This laboratory is certified under the Clinical Laboratory Improvement Amendments CLIA as qualified to perform high complexity clinical laboratory testing.    TRIMETH/SULFA Value in next row Sensitive      <=4 SENSITIVEThis is a modified FDA-approved test that has been validated and its performance characteristics determined by the reporting laboratory.  This laboratory is certified under the Clinical Laboratory Improvement Amendments CLIA as qualified to perform high complexity clinical laboratory testing.    CLINDAMYCIN Value in next row Resistant      <=4 SENSITIVEThis is a modified FDA-approved test that has been validated and its performance characteristics determined by the reporting laboratory.  This laboratory is certified under the Clinical Laboratory Improvement Amendments CLIA as qualified to perform high complexity clinical laboratory testing.    RIFAMPIN Value in next row Sensitive      <=4 SENSITIVEThis is a modified FDA-approved test that has been validated and its performance characteristics determined by the reporting laboratory.  This laboratory is certified under the Clinical Laboratory Improvement Amendments CLIA as qualified to perform high complexity clinical laboratory testing.    Inducible Clindamycin Value in next row Sensitive      <=4 SENSITIVEThis is a modified FDA-approved test that has been validated and its performance characteristics determined by the reporting laboratory.  This laboratory is certified under the Clinical Laboratory Improvement Amendments CLIA as qualified to perform high complexity clinical laboratory testing.    LINEZOLID  Value in next row Sensitive      <=4 SENSITIVEThis is a modified  FDA-approved test that has been validated and its performance characteristics determined by the reporting laboratory.  This laboratory is certified under the Clinical Laboratory Improvement Amendments CLIA as qualified to perform high complexity clinical laboratory testing.    * STAPHYLOCOCCUS AUREUS  Blood Culture ID Panel (Reflexed)     Status: Abnormal   Collection Time: 11/11/24  1:29 PM  Result Value Ref Range Status   Enterococcus faecalis NOT DETECTED NOT DETECTED Corrected   Enterococcus Faecium NOT DETECTED NOT DETECTED Corrected   Listeria monocytogenes NOT DETECTED NOT DETECTED Corrected   Staphylococcus species DETECTED (A) NOT DETECTED Corrected    Comment: CRITICAL RESULT CALLED TO, READ BACK BY AND VERIFIED WITH: PHARMD G ABBOTT 11/12/2024 @ 0347 BY AB    Staphylococcus aureus (BCID) DETECTED (A) NOT DETECTED Corrected    Comment: CRITICAL RESULT CALLED TO, READ BACK BY AND VERIFIED WITH: PHARMD G ABBOTT 11/12/2024 @ 0347 BY AB    Staphylococcus epidermidis NOT DETECTED NOT DETECTED Corrected   Staphylococcus lugdunensis NOT DETECTED NOT DETECTED Corrected   Streptococcus species NOT DETECTED NOT DETECTED Corrected   Streptococcus agalactiae NOT DETECTED NOT DETECTED Corrected   Streptococcus pneumoniae NOT DETECTED NOT DETECTED Corrected   Streptococcus pyogenes NOT DETECTED NOT DETECTED Corrected   A.calcoaceticus-baumannii NOT DETECTED NOT DETECTED Corrected   Bacteroides fragilis NOT DETECTED NOT DETECTED Corrected   Enterobacterales DETECTED (A) NOT DETECTED Corrected    Comment: Enterobacterales represent a large order of gram negative bacteria, not a single organism. CRITICAL RESULT CALLED TO, READ BACK BY AND VERIFIED WITH: PHARMD G ABBOTT 11/12/2024 @ 0347 BY AB    Enterobacter cloacae complex NOT DETECTED NOT DETECTED Corrected   Escherichia coli NOT DETECTED NOT DETECTED Corrected   Klebsiella aerogenes NOT DETECTED NOT DETECTED Corrected   Klebsiella oxytoca  NOT DETECTED NOT DETECTED Corrected   Klebsiella pneumoniae NOT DETECTED NOT DETECTED Corrected   Proteus species DETECTED (A) NOT DETECTED Corrected  Comment: CRITICAL RESULT CALLED TO, READ BACK BY AND VERIFIED WITH: PHARMD G ABBOTT 11/12/2024 @ 0347 BY AB    Salmonella species NOT DETECTED NOT DETECTED Corrected   Serratia marcescens NOT DETECTED NOT DETECTED Corrected   Haemophilus influenzae NOT DETECTED NOT DETECTED Corrected   Neisseria meningitidis NOT DETECTED NOT DETECTED Corrected   Pseudomonas aeruginosa NOT DETECTED NOT DETECTED Corrected   Stenotrophomonas maltophilia NOT DETECTED NOT DETECTED Corrected   Candida albicans NOT DETECTED NOT DETECTED Corrected   Candida auris NOT DETECTED NOT DETECTED Corrected   Candida glabrata NOT DETECTED NOT DETECTED Corrected   Candida krusei NOT DETECTED NOT DETECTED Corrected   Candida parapsilosis NOT DETECTED NOT DETECTED Corrected   Candida tropicalis NOT DETECTED NOT DETECTED Corrected   Cryptococcus neoformans/gattii NOT DETECTED NOT DETECTED Corrected   CTX-M ESBL NOT DETECTED NOT DETECTED Corrected   Carbapenem resistance IMP NOT DETECTED NOT DETECTED Corrected   Carbapenem resistance KPC NOT DETECTED NOT DETECTED Corrected   Meth resistant mecA/C and MREJ NOT DETECTED NOT DETECTED Corrected   Carbapenem resistance NDM NOT DETECTED NOT DETECTED Corrected   Carbapenem resist OXA 48 LIKE NOT DETECTED NOT DETECTED Corrected   Carbapenem resistance VIM NOT DETECTED NOT DETECTED Corrected    Comment: Performed at [facility], [address] [place], [place] [address]  Blood culture (routine x 2)     Status: None   Collection Time: 11/11/24  8:57 PM   Specimen: BLOOD LEFT HAND  Result Value Ref Range Status   Specimen Description BLOOD LEFT HAND  Final   Special Requests   Final    BOTTLES DRAWN AEROBIC AND ANAEROBIC Blood Culture adequate volume   Culture   Final    NO GROWTH 5 DAYS Performed at [facility]  Lab, [address], [place], [place] [address]    Report Status 11/16/2024 FINAL  Final  MRSA Next Gen by PCR, Nasal     Status: None   Collection Time: 11/11/24  9:48 PM   Specimen: Nasal Mucosa; Nasal Swab  Result Value Ref Range Status   MRSA by PCR Next Gen NOT DETECTED NOT DETECTED Final    Comment: (NOTE) The GeneXpert MRSA Assay (FDA approved for NASAL specimens only), is one component of a comprehensive MRSA colonization surveillance program. It is not intended to diagnose MRSA infection nor to guide or monitor treatment for MRSA infections. Test performance is not FDA approved in patients less than 46 years old. Performed at [facility], [address], [place], [place] [address]   Respiratory (~20 pathogens) panel by PCR     Status: None   Collection Time: 11/12/24 10:28 AM   Specimen: Nasopharyngeal Swab; Respiratory  Result Value Ref Range Status   Adenovirus NOT DETECTED NOT DETECTED Final   Coronavirus 229E NOT DETECTED NOT DETECTED Final    Comment: (NOTE) The Coronavirus on the Respiratory Panel, DOES NOT test for the novel  Coronavirus (2019 nCoV)    Coronavirus HKU1 NOT DETECTED NOT DETECTED Final   Coronavirus NL63 NOT DETECTED NOT DETECTED Final   Coronavirus OC43 NOT DETECTED NOT DETECTED Final   Metapneumovirus NOT DETECTED NOT DETECTED Final   Rhinovirus / Enterovirus NOT DETECTED NOT DETECTED Final   Influenza A NOT DETECTED NOT DETECTED Final   Influenza B NOT DETECTED NOT DETECTED Final   Parainfluenza Virus 1 NOT DETECTED NOT DETECTED Final   Parainfluenza Virus 2 NOT DETECTED NOT DETECTED Final   Parainfluenza Virus 3 NOT DETECTED NOT  DETECTED Final   Parainfluenza Virus 4 NOT DETECTED NOT DETECTED Final   Respiratory Syncytial Virus NOT DETECTED NOT DETECTED Final   Bordetella pertussis NOT DETECTED NOT DETECTED Final   Bordetella Parapertussis NOT DETECTED NOT DETECTED Final   Chlamydophila pneumoniae NOT DETECTED NOT DETECTED Final    Mycoplasma pneumoniae NOT DETECTED NOT DETECTED Final    Comment: Performed at [facility], [address], [place], [place] [address]  Culture, blood (Routine X 2) w Reflex to ID Panel     Status: None   Collection Time: 11/13/24  7:29 AM   Specimen: BLOOD RIGHT HAND  Result Value Ref Range Status   Specimen Description BLOOD RIGHT HAND  Final   Special Requests   Final    BOTTLES DRAWN AEROBIC AND ANAEROBIC Blood Culture results may not be optimal due to an inadequate volume of blood received in culture bottles   Culture   Final    NO GROWTH 5 DAYS Performed at [facility], [address], [place], [place] [address]    Report Status 11/18/2024 FINAL  Final  Culture, blood (Routine X 2) w Reflex to ID Panel     Status: None   Collection Time: 11/13/24  7:29 AM   Specimen: BLOOD LEFT ARM  Result Value Ref Range Status   Specimen Description BLOOD LEFT ARM  Final   Special Requests   Final    BOTTLES DRAWN AEROBIC AND ANAEROBIC Blood Culture results may not be optimal due to an inadequate volume of blood received in culture bottles   Culture   Final    NO GROWTH 5 DAYS Performed at [facility], [address], [place], [place] [address]    Report Status 11/18/2024 FINAL  Final    Corean Fireman, MSN, NP-C [facility]  [email or web address] Pager: [phone] Office: [phone] [facility] Main Line: [phone] *Secure Chat Communication Welcome       [1]  Allergies Allergen Reactions   Ace Inhibitors Cough   Maxidex  [Dexamethasone ] Other (See Comments)    Sexual dysfunction   Verapamil Other (See Comments)    constipation   "

## 2024-11-18 NOTE — Progress Notes (Signed)
 Noted that pt has progressed to ESRD, have informed Museum/gallery Exhibitions Officer at [facility].   Merrianne Mccumbers Dialysis Navigator [phone]

## 2024-11-18 NOTE — Transfer of Care (Signed)
 Immediate Anesthesia Transfer of Care Note  Patient: Brendan [name].  Procedure(s) Performed: TRANSESOPHAGEAL ECHOCARDIOGRAM  Patient Location: PACU and Cath Lab  Anesthesia Type:MAC  Level of Consciousness: awake  Airway & Oxygen Therapy: Patient Spontanous Breathing  Post-op Assessment: Report given to RN  Post vital signs: stable  Last Vitals:  Vitals Value Taken Time  BP    Temp    Pulse    Resp    SpO2      Last Pain:  Vitals:   11/18/24 1428  TempSrc:   PainSc: 0-No pain      Patients Stated Pain Goal: 0 (11/17/24 2007)  Complications: No notable events documented.

## 2024-11-18 NOTE — Progress Notes (Signed)
 Pt returned from HD. VSS. RN verified with cath lab NPO status. Cath lab stated they may be able to get patient's procedure done today and to keep patient NPO. Patient made aware. Orders for CBC/platelets placed by MD.

## 2024-11-18 NOTE — Plan of Care (Signed)
" °  Problem: Coping: Goal: Ability to adjust to condition or change in health will improve Outcome: Progressing   Problem: Fluid Volume: Goal: Ability to maintain a balanced intake and output will improve Outcome: Progressing   Problem: Health Behavior/Discharge Planning: Goal: Ability to manage health-related needs will improve Outcome: Progressing   Problem: Tissue Perfusion: Goal: Adequacy of tissue perfusion will improve Outcome: Progressing   Problem: Health Behavior/Discharge Planning: Goal: Ability to manage health-related needs will improve Outcome: Progressing   "

## 2024-11-18 NOTE — Progress Notes (Signed)
 " PROGRESS NOTE    Brendan [name].  FMW:[id number] DOB: 1956/06/22 DOA: 11/11/2024 PCP: Rexanne Ingle, MD  Subjective: Patient reports feeling okay, seen after his TEE and no acute complaints at this time   Hospital Course: 68 year old history of Fournier's gangrene 08/2024 status post I&D drainage wound 08/30/2024 Dr. Salley was felt secondary to use of Farxiga -cultures grew strep constellatus MSSA Prevotella + beta-lactamase positive and patient was placed on Augmentin  at time of discharge EOT 09/13/2024-discharge weight 301 10/28 readmitted SOB-AKI worsening 18 pound weight gain-cardiology nephrology consulted 2/2 NYHA IV symptoms--started on IHD status post tunneled cath 09/22/2024 12/16 readmission obtunded on the floor WBC 17,000 CT contusion with TBI?  Meningioma-Rx Keppra  and was not discharged on the same as neurology did not feel this was a necessity at that time--- was discharged 12/19   Chr med h/o--OSA HTN IDDM hypothyroid HFpEF gout   12/24 came back to the emergency room feeling weak blood pressure low has been on 4 times a week HD had a fever-CBG noted to be 380 lactic acid 1.4 Tmax 101 WBC 21.6 blood pressure 90/40 nephrology consulted-crystalloid bolus fluids given Vanco Flagyl  cefepime  added 12/25 early a.m. seem to be in DKA transferred to progressive unit 2/2 concern for DKA CT head cystic hygromas 4 mm right 3 mm left trace leftward midline shift regressed not resolved inferior frontal gyrus hemorrhagic contusion--37 mm stable right meningioma 12/26 underwent HD and HD cath removed by IR 12/31 had TEE done, negative for endocarditis    Assessment and Plan:  Sepsis secondary to MSSA and proteus mirabilis bacteremia related to HD line ID following. Continue cefepime  HD cath removed by IR on 12/26 and replaced on 12/30 after a line holiday S/p TEE today with no evidence of endocarditis ID following, plan for cefepime  with HD till 1/11 to complete 2 weeks following  line removal    DM with hyperglycemia Continue current insulin  regimen    Immunosuppression secondary to psoriasis follows with Dr. Dianah (dermatology) for the past 6 years Has not been able to obtain Bimzelx yet holding clobetasol  at this time   Newly declared ESRD-dialyzing via right tunneled cath placed 09/22/2024 On HD, had a session this am  Next HD planned for Thursday    HFpEF NYHA IV Will not recommend SGLT2 in the future given groin wounds Amlodipine  5 held Coreg  12.5 twice daily held, torsemide  100 MT ThF held Monitor BMP and resume coreg  as able    Hypothyroidism Continue Synthroid  125   Recent TBI brain managed nonsurgically during hospitalization 12/16-12/9. 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 Victory Boers 12/26 neurosurgery --absence of mental status changes focal deficit --nothing surgically specific to do in this case   Scrotal wound in the setting of Fournier's gangrene as above Last seen [facility] surgery 10/13/2024 WOC following   BMI 36 Recommend diet/lifestyle modification  Hyponatremia - improved with HD, continue fluid restriction    DVT prophylaxis: Place and maintain sequential compression device Start: 11/11/24 1752    Code Status: Full Code Family Communication: Updated at bedside Disposition Plan: rehab Reason for continuing need for hospitalization: rehab placement   Objective: Vitals:   11/18/24 1334 11/18/24 1454 11/18/24 1504 11/18/24 1514  BP: (!) 146/78 137/66 (!) 144/77 (!) 158/75  Pulse: 68 75 70 70  Resp: 18 15 13 12   Temp: 97.9 F (36.6 C) 97.7 F (36.5 C)  TempSrc: Temporal Tympanic    SpO2: 100% 100% 100% 97%  Weight:      Height:        Intake/Output Summary (Last 24 hours) at 11/18/2024 1609 Last data filed at 11/18/2024 1446 Gross per 24 hour  Intake 350 ml  Output 3375 ml  Net -3025 ml    Filed Weights   11/15/24 0622 11/18/24 0430 11/18/24 0858  Weight: 127.6 kg 127.6 kg 123.8 kg    Examination:  Physical Exam Vitals and nursing note reviewed.  Constitutional:      General: He is not in acute distress. Cardiovascular:     Rate and Rhythm: Normal rate.  Pulmonary:     Effort: No respiratory distress.  Abdominal:     General: There is no distension.  Musculoskeletal:     Right lower leg: No edema.     Left lower leg: No edema.     Data Reviewed: I have personally reviewed following labs and imaging studies  CBC: Recent Labs  Lab 11/13/24 0729 11/14/24 0449 11/15/24 0326 11/16/24 0355 11/17/24 0246 11/18/24 1019  WBC 19.0* 23.7* 22.4* 19.5* 17.6* 18.0*  NEUTROABS 14.4* 20.1* 18.3*  --   --  12.9*  HGB 10.5* 9.3* 9.5* 9.6* 9.1* 9.6*  HCT 30.2* 27.1* 27.2* 27.7* 25.9* 26.9*  MCV 81.8 81.4 81.9 81.0 79.9* 78.7*  PLT 56* 77* PLATELET CLUMPS NOTED ON SMEAR, UNABLE TO ESTIMATE 67* 93* 125*   Basic Metabolic Panel: Recent Labs  Lab 11/12/24 0307 11/12/24 0500 11/13/24 0426 11/14/24 0449 11/15/24 0326 11/16/24 0355 11/17/24 0246 11/18/24 1018  NA 126*   < > 129* 127* 127* 126* 126* 129*  K 4.2   < > 3.8 4.1 4.4 4.3 4.3 4.0  CL 90*   < > 94* 92* 91* 91* 92* 92*  CO2 23   < > 22 24 25 23 23 23   GLUCOSE 531*   < > 218* 262* 298* 328* 265* 240*  BUN 41*   < > 29* 44* 54* 59* 66* 40*  CREATININE 4.49*   < > 3.18* 4.54* 5.30* 5.71* 6.18* 4.09*  CALCIUM 8.3*   < > 8.2* 8.1* 8.1* 8.3* 8.2* 8.0*  PHOS 4.1  --  2.4* 3.1 3.1  --   --   --    < > = values in this interval not displayed.   GFR: Estimated Creatinine Clearance: 23.8 mL/min (A) (by C-G formula based on SCr of 4.09 mg/dL (H)). Liver Function Tests: Recent Labs  Lab 11/14/24 0449 11/15/24 0326 11/16/24 0355 11/17/24 0246 11/18/24 1018  AST  --   --  <10* 11* 11*  ALT  --   --  6 7 6   ALKPHOS  --   --  131* 122 124  BILITOT  --   --  0.6 0.5 0.6  PROT  --   --  6.1* 6.0* 6.3*   ALBUMIN  2.2* 2.3* 2.3* 2.3* 2.4*   No results for input(s): LIPASE, AMYLASE in the last 168 hours. No results for input(s): AMMONIA in the last 168 hours. Coagulation Profile: No results for input(s): INR, PROTIME in the last 168 hours. Cardiac Enzymes: No results for input(s): CKTOTAL, CKMB, CKMBINDEX, TROPONINI in the last 168 hours. ProBNP, BNP (last 5 results) Recent Labs    01/20/24 1042 08/28/24 1613 09/15/24 1846  PROBNP  --  6,038.0*  --   BNP 295.6*  --  728.9*   HbA1C: No results for input(s): HGBA1C in the last 72 hours. CBG:  Recent Labs  Lab 11/17/24 1520 11/17/24 1611 11/17/24 2058 11/18/24 0406 11/18/24 0950  GLUCAP 275* 263* 239* 284* 226*   Lipid Profile: No results for input(s): CHOL, HDL, LDLCALC, TRIG, CHOLHDL, LDLDIRECT in the last 72 hours. Thyroid  Function Tests: No results for input(s): TSH, T4TOTAL, FREET4, T3FREE, THYROIDAB in the last 72 hours. Anemia Panel: No results for input(s): VITAMINB12, FOLATE, FERRITIN, TIBC, IRON , RETICCTPCT in the last 72 hours. Sepsis Labs: No results for input(s): PROCALCITON, LATICACIDVEN in the last 168 hours.  Recent Results (from the past 240 hours)  Resp panel by RT-PCR (RSV, Flu A&B, Covid) Anterior Nasal Swab     Status: None   Collection Time: 11/11/24 11:25 AM   Specimen: Anterior Nasal Swab  Result Value Ref Range Status   SARS Coronavirus 2 by RT PCR NEGATIVE NEGATIVE Final   Influenza A by PCR NEGATIVE NEGATIVE Final   Influenza B by PCR NEGATIVE NEGATIVE Final    Comment: (NOTE) The Xpert Xpress SARS-CoV-2/FLU/RSV plus assay is intended as an aid in the diagnosis of influenza from Nasopharyngeal swab specimens and should not be used as a sole basis for treatment. Nasal washings and aspirates are unacceptable for Xpert Xpress SARS-CoV-2/FLU/RSV testing.  Fact Sheet for Patients: bloggercourse.com  Fact Sheet for  Healthcare Providers: seriousbroker.it  This test is not yet approved or cleared by the [place]  FDA and has been authorized for detection and/or diagnosis of SARS-CoV-2 by FDA under an Emergency Use Authorization (EUA). This EUA will remain in effect (meaning this test can be used) for the duration of the COVID-19 declaration under Section 564(b)(1) of the Act, 21 U.S.C. section 360bbb-3(b)(1), unless the authorization is terminated or revoked.     Resp Syncytial Virus by PCR NEGATIVE NEGATIVE Final    Comment: (NOTE) Fact Sheet for Patients: bloggercourse.com  Fact Sheet for Healthcare Providers: seriousbroker.it  This test is not yet approved or cleared by the [place]  FDA and has been authorized for detection and/or diagnosis of SARS-CoV-2 by FDA under an Emergency Use Authorization (EUA). This EUA will remain in effect (meaning this test can be used) for the duration of the COVID-19 declaration under Section 564(b)(1) of the Act, 21 U.S.C. section 360bbb-3(b)(1), unless the authorization is terminated or revoked.  Performed at [facility], [address], [place], [place] [address]   Blood culture (routine x 2)     Status: Abnormal   Collection Time: 11/11/24  1:29 PM   Specimen: BLOOD  Result Value Ref Range Status   Specimen Description BLOOD SITE NOT SPECIFIED  Final   Special Requests   Final    BOTTLES DRAWN AEROBIC AND ANAEROBIC Blood Culture results may not be optimal due to an inadequate volume of blood received in culture bottles   Culture  Setup Time   Final    GRAM POSITIVE COCCI IN CLUSTERS GRAM NEGATIVE RODS IN BOTH AEROBIC AND ANAEROBIC BOTTLES CRITICAL RESULT CALLED TO, READ BACK BY AND VERIFIED WITH: PHARMD G ABBOTT 11/12/2024 @ 0347 BY AB Performed at [facility], [address] [place], [place] [address]    Culture PROTEUS MIRABILIS STAPHYLOCOCCUS  AUREUS  (A)  Final   Report Status 11/15/2024 FINAL  Final   Organism ID, Bacteria PROTEUS MIRABILIS  Final   Organism ID, Bacteria STAPHYLOCOCCUS AUREUS  Final      Susceptibility   Proteus mirabilis - MIC*    AMPICILLIN  <=2 SENSITIVE Sensitive     CEFAZOLIN  (NON-URINE) 4 INTERMEDIATE Intermediate  CEFEPIME  <=0.12 SENSITIVE Sensitive     ERTAPENEM <=0.12 SENSITIVE Sensitive     CEFTRIAXONE  <=0.25 SENSITIVE Sensitive     CIPROFLOXACIN <=0.06 SENSITIVE Sensitive     GENTAMICIN <=1 SENSITIVE Sensitive     MEROPENEM 1 SENSITIVE Sensitive     TRIMETH/SULFA <=20 SENSITIVE Sensitive     AMPICILLIN /SULBACTAM <=2 SENSITIVE Sensitive     PIP/TAZO Value in next row Sensitive      <=4 SENSITIVEThis is a modified FDA-approved test that has been validated and its performance characteristics determined by the reporting laboratory.  This laboratory is certified under the Clinical Laboratory Improvement Amendments CLIA as qualified to perform high complexity clinical laboratory testing.    * PROTEUS MIRABILIS   Staphylococcus aureus - MIC*    CIPROFLOXACIN Value in next row Sensitive      <=4 SENSITIVEThis is a modified FDA-approved test that has been validated and its performance characteristics determined by the reporting laboratory.  This laboratory is certified under the Clinical Laboratory Improvement Amendments CLIA as qualified to perform high complexity clinical laboratory testing.    ERYTHROMYCIN Value in next row Resistant      <=4 SENSITIVEThis is a modified FDA-approved test that has been validated and its performance characteristics determined by the reporting laboratory.  This laboratory is certified under the Clinical Laboratory Improvement Amendments CLIA as qualified to perform high complexity clinical laboratory testing.    GENTAMICIN Value in next row Sensitive      <=4 SENSITIVEThis is a modified FDA-approved test that has been validated and its performance characteristics determined  by the reporting laboratory.  This laboratory is certified under the Clinical Laboratory Improvement Amendments CLIA as qualified to perform high complexity clinical laboratory testing.    OXACILLIN Value in next row Sensitive      <=4 SENSITIVEThis is a modified FDA-approved test that has been validated and its performance characteristics determined by the reporting laboratory.  This laboratory is certified under the Clinical Laboratory Improvement Amendments CLIA as qualified to perform high complexity clinical laboratory testing.    TETRACYCLINE Value in next row Resistant      <=4 SENSITIVEThis is a modified FDA-approved test that has been validated and its performance characteristics determined by the reporting laboratory.  This laboratory is certified under the Clinical Laboratory Improvement Amendments CLIA as qualified to perform high complexity clinical laboratory testing.    VANCOMYCIN  Value in next row Sensitive      <=4 SENSITIVEThis is a modified FDA-approved test that has been validated and its performance characteristics determined by the reporting laboratory.  This laboratory is certified under the Clinical Laboratory Improvement Amendments CLIA as qualified to perform high complexity clinical laboratory testing.    TRIMETH/SULFA Value in next row Sensitive      <=4 SENSITIVEThis is a modified FDA-approved test that has been validated and its performance characteristics determined by the reporting laboratory.  This laboratory is certified under the Clinical Laboratory Improvement Amendments CLIA as qualified to perform high complexity clinical laboratory testing.    CLINDAMYCIN Value in next row Resistant      <=4 SENSITIVEThis is a modified FDA-approved test that has been validated and its performance characteristics determined by the reporting laboratory.  This laboratory is certified under the Clinical Laboratory Improvement Amendments CLIA as qualified to perform high complexity  clinical laboratory testing.    RIFAMPIN Value in next row Sensitive      <=4 SENSITIVEThis is a modified FDA-approved test that has been validated and its performance characteristics determined  by the reporting laboratory.  This laboratory is certified under the Clinical Laboratory Improvement Amendments CLIA as qualified to perform high complexity clinical laboratory testing.    Inducible Clindamycin Value in next row Sensitive      <=4 SENSITIVEThis is a modified FDA-approved test that has been validated and its performance characteristics determined by the reporting laboratory.  This laboratory is certified under the Clinical Laboratory Improvement Amendments CLIA as qualified to perform high complexity clinical laboratory testing.    LINEZOLID  Value in next row Sensitive      <=4 SENSITIVEThis is a modified FDA-approved test that has been validated and its performance characteristics determined by the reporting laboratory.  This laboratory is certified under the Clinical Laboratory Improvement Amendments CLIA as qualified to perform high complexity clinical laboratory testing.    * STAPHYLOCOCCUS AUREUS  Blood Culture ID Panel (Reflexed)     Status: Abnormal   Collection Time: 11/11/24  1:29 PM  Result Value Ref Range Status   Enterococcus faecalis NOT DETECTED NOT DETECTED Corrected   Enterococcus Faecium NOT DETECTED NOT DETECTED Corrected   Listeria monocytogenes NOT DETECTED NOT DETECTED Corrected   Staphylococcus species DETECTED (A) NOT DETECTED Corrected    Comment: CRITICAL RESULT CALLED TO, READ BACK BY AND VERIFIED WITH: PHARMD G ABBOTT 11/12/2024 @ 0347 BY AB    Staphylococcus aureus (BCID) DETECTED (A) NOT DETECTED Corrected    Comment: CRITICAL RESULT CALLED TO, READ BACK BY AND VERIFIED WITH: PHARMD G ABBOTT 11/12/2024 @ 0347 BY AB    Staphylococcus epidermidis NOT DETECTED NOT DETECTED Corrected   Staphylococcus lugdunensis NOT DETECTED NOT DETECTED Corrected    Streptococcus species NOT DETECTED NOT DETECTED Corrected   Streptococcus agalactiae NOT DETECTED NOT DETECTED Corrected   Streptococcus pneumoniae NOT DETECTED NOT DETECTED Corrected   Streptococcus pyogenes NOT DETECTED NOT DETECTED Corrected   A.calcoaceticus-baumannii NOT DETECTED NOT DETECTED Corrected   Bacteroides fragilis NOT DETECTED NOT DETECTED Corrected   Enterobacterales DETECTED (A) NOT DETECTED Corrected    Comment: Enterobacterales represent a large order of gram negative bacteria, not a single organism. CRITICAL RESULT CALLED TO, READ BACK BY AND VERIFIED WITH: PHARMD G ABBOTT 11/12/2024 @ 0347 BY AB    Enterobacter cloacae complex NOT DETECTED NOT DETECTED Corrected   Escherichia coli NOT DETECTED NOT DETECTED Corrected   Klebsiella aerogenes NOT DETECTED NOT DETECTED Corrected   Klebsiella oxytoca NOT DETECTED NOT DETECTED Corrected   Klebsiella pneumoniae NOT DETECTED NOT DETECTED Corrected   Proteus species DETECTED (A) NOT DETECTED Corrected    Comment: CRITICAL RESULT CALLED TO, READ BACK BY AND VERIFIED WITH: PHARMD G ABBOTT 11/12/2024 @ 0347 BY AB    Salmonella species NOT DETECTED NOT DETECTED Corrected   Serratia marcescens NOT DETECTED NOT DETECTED Corrected   Haemophilus influenzae NOT DETECTED NOT DETECTED Corrected   Neisseria meningitidis NOT DETECTED NOT DETECTED Corrected   Pseudomonas aeruginosa NOT DETECTED NOT DETECTED Corrected   Stenotrophomonas maltophilia NOT DETECTED NOT DETECTED Corrected   Candida albicans NOT DETECTED NOT DETECTED Corrected   Candida auris NOT DETECTED NOT DETECTED Corrected   Candida glabrata NOT DETECTED NOT DETECTED Corrected   Candida krusei NOT DETECTED NOT DETECTED Corrected   Candida parapsilosis NOT DETECTED NOT DETECTED Corrected   Candida tropicalis NOT DETECTED NOT DETECTED Corrected   Cryptococcus neoformans/gattii NOT DETECTED NOT DETECTED Corrected   CTX-M ESBL NOT DETECTED NOT DETECTED Corrected    Carbapenem resistance IMP NOT DETECTED NOT DETECTED Corrected   Carbapenem resistance KPC NOT DETECTED NOT DETECTED  Corrected   Meth resistant mecA/C and MREJ NOT DETECTED NOT DETECTED Corrected   Carbapenem resistance NDM NOT DETECTED NOT DETECTED Corrected   Carbapenem resist OXA 48 LIKE NOT DETECTED NOT DETECTED Corrected   Carbapenem resistance VIM NOT DETECTED NOT DETECTED Corrected    Comment: Performed at [facility], [address] [place], [place] [address]  Blood culture (routine x 2)     Status: None   Collection Time: 11/11/24  8:57 PM   Specimen: BLOOD LEFT HAND  Result Value Ref Range Status   Specimen Description BLOOD LEFT HAND  Final   Special Requests   Final    BOTTLES DRAWN AEROBIC AND ANAEROBIC Blood Culture adequate volume   Culture   Final    NO GROWTH 5 DAYS Performed at [facility], [address], [place], [place] [address]    Report Status 11/16/2024 FINAL  Final  MRSA Next Gen by PCR, Nasal     Status: None   Collection Time: 11/11/24  9:48 PM   Specimen: Nasal Mucosa; Nasal Swab  Result Value Ref Range Status   MRSA by PCR Next Gen NOT DETECTED NOT DETECTED Final    Comment: (NOTE) The GeneXpert MRSA Assay (FDA approved for NASAL specimens only), is one component of a comprehensive MRSA colonization surveillance program. It is not intended to diagnose MRSA infection nor to guide or monitor treatment for MRSA infections. Test performance is not FDA approved in patients less than 51 years old. Performed at [facility], [address], [place], [place] [address]   Respiratory (~20 pathogens) panel by PCR     Status: None   Collection Time: 11/12/24 10:28 AM   Specimen: Nasopharyngeal Swab; Respiratory  Result Value Ref Range Status   Adenovirus NOT DETECTED NOT DETECTED Final   Coronavirus 229E NOT DETECTED NOT DETECTED Final    Comment: (NOTE) The Coronavirus on the Respiratory Panel, DOES NOT test for the novel  Coronavirus  (2019 nCoV)    Coronavirus HKU1 NOT DETECTED NOT DETECTED Final   Coronavirus NL63 NOT DETECTED NOT DETECTED Final   Coronavirus OC43 NOT DETECTED NOT DETECTED Final   Metapneumovirus NOT DETECTED NOT DETECTED Final   Rhinovirus / Enterovirus NOT DETECTED NOT DETECTED Final   Influenza A NOT DETECTED NOT DETECTED Final   Influenza B NOT DETECTED NOT DETECTED Final   Parainfluenza Virus 1 NOT DETECTED NOT DETECTED Final   Parainfluenza Virus 2 NOT DETECTED NOT DETECTED Final   Parainfluenza Virus 3 NOT DETECTED NOT DETECTED Final   Parainfluenza Virus 4 NOT DETECTED NOT DETECTED Final   Respiratory Syncytial Virus NOT DETECTED NOT DETECTED Final   Bordetella pertussis NOT DETECTED NOT DETECTED Final   Bordetella Parapertussis NOT DETECTED NOT DETECTED Final   Chlamydophila pneumoniae NOT DETECTED NOT DETECTED Final   Mycoplasma pneumoniae NOT DETECTED NOT DETECTED Final    Comment: Performed at [facility], [address], [place], [place] [address]  Culture, blood (Routine X 2) w Reflex to ID Panel     Status: None   Collection Time: 11/13/24  7:29 AM   Specimen: BLOOD RIGHT HAND  Result Value Ref Range Status   Specimen Description BLOOD RIGHT HAND  Final   Special Requests   Final    BOTTLES DRAWN AEROBIC AND ANAEROBIC Blood Culture results may not be optimal due to an inadequate volume of blood received in culture bottles   Culture   Final    NO GROWTH  5 DAYS Performed at [facility], [address] [place], [place] [address]    Report Status 11/18/2024 FINAL  Final  Culture, blood (Routine X 2) w Reflex to ID Panel     Status: None   Collection Time: 11/13/24  7:29 AM   Specimen: BLOOD LEFT ARM  Result Value Ref Range Status   Specimen Description BLOOD LEFT ARM  Final   Special Requests   Final    BOTTLES DRAWN AEROBIC AND ANAEROBIC Blood Culture results may not be optimal due to an inadequate volume of blood received in culture bottles   Culture   Final     NO GROWTH 5 DAYS Performed at [facility], [address], [place], [place] [address]    Report Status 11/18/2024 FINAL  Final     Radiology Studies: EP STUDY Result Date: 11/18/2024 See surgical note for result.  IR TUNNELED CENTRAL VENOUS CATH [facility] W IMG Result Date: 11/17/2024 INDICATION: Right jugular dialysis catheter was recently removed for a line holiday. Patient needs a new dialysis access. EXAM: FLUOROSCOPIC AND ULTRASOUND GUIDED PLACEMENT OF A TUNNELED DIALYSIS CATHETER Physician: Juliene SAUNDERS. Philip, MD MEDICATIONS: Ancef  2 g; The antibiotic was administered within an appropriate time interval prior to skin puncture. ANESTHESIA/SEDATION: Moderate (conscious) sedation was employed during this procedure. A total of Versed  1 mg and fentanyl  50 mcg was administered intravenously at the order of the provider performing the procedure. Total intra-service moderate sedation time: 24 minutes. Patient's level of consciousness and vital signs were monitored continuously by radiology nurse throughout the procedure under the supervision of the provider performing the procedure. FLUOROSCOPY TIME:  Radiation Exposure Index (as provided by the fluoroscopic device): 26 mGy Kerma COMPLICATIONS: None immediate. PROCEDURE: Informed consent was obtained for placement of a tunneled dialysis catheter. The patient was placed supine on the interventional table. Ultrasound confirmed a patent left internal jugular vein. Ultrasound image obtained for documentation. The left neck and chest was prepped and draped in a sterile fashion. Maximal barrier sterile technique was utilized including caps, mask, sterile gowns, sterile gloves, sterile drape, hand hygiene and skin antiseptic. The left neck was anesthetized with 1% lidocaine . A small incision was made with #11 blade scalpel. A 21 gauge needle directed into the left internal jugular vein with ultrasound guidance. A micropuncture dilator set was placed. A 33 cm tip to  cuff Palindrome catheter was selected. The skin below the left clavicle was anesthetized and a small incision was made with an #11 blade scalpel. A subcutaneous tunnel was formed to the vein dermatotomy site. The catheter was brought through the tunnel. The vein dermatotomy site was dilated to accommodate a peel-away sheath over a wire. The catheter was placed through the peel-away sheath and directed into the central venous structures. The tip of the catheter was placed in the right atrium with fluoroscopy. Fluoroscopic images were obtained for documentation. Both lumens were found to aspirate and flush well. The proper amount of heparin  was flushed in both lumens. The vein dermatotomy site was closed using a single layer of absorbable suture and Dermabond. Gel-Foam was placed in the subcutaneous tract. The catheter was secured to the skin using Prolene suture. IMPRESSION: Successful placement of a left jugular tunneled dialysis catheter using ultrasound and fluoroscopic guidance. Electronically Signed   By: Juliene Philip M.D.   On: 11/17/2024 20:16    Scheduled Meds:  calcitRIOL   0.25 mcg Oral Daily   Chlorhexidine  Gluconate Cloth  6 each Topical Daily  Chlorhexidine  Gluconate Cloth  6 each Topical Q0600   Chlorhexidine  Gluconate Cloth  6 each Topical Q0600   darbepoetin (ARANESP ) injection - DIALYSIS  100 mcg Subcutaneous Q Sat-1800   insulin  aspart  0-6 Units Subcutaneous TID WC   insulin  aspart  4 Units Subcutaneous TID WC   insulin  glargine  15 Units Subcutaneous Daily   levothyroxine   125 mcg Oral q AM   polyethylene glycol  17 g Oral BID   simvastatin   20 mg Oral QPM   sodium chloride  flush  10-40 mL Intracatheter Q12H   sodium chloride  flush  3 mL Intravenous Q12H   Continuous Infusions:   ceFAZolin  (ANCEF ) IV     [START ON 11/19/2024] ceFEPime  (MAXIPIME ) IV       LOS: 7 days   Time spent: 40 minutes  Casimer Dare, MD  [facility]  11/18/2024, 4:09 PM   "

## 2024-11-18 NOTE — Progress Notes (Addendum)
 PHARMACY CONSULT NOTE FOR:  OUTPATIENT  PARENTERAL ANTIBIOTIC THERAPY (Informational purposes only - will be receiving antibiotic at hemodialysis)  Indication: MSSA and proteus bacteremia Regimen: Cefepime  1g post HD Mon/Thurs and 2g post HD Tues/Fri End date: November 27, 2024  IV antibiotic discharge orders are pended. To discharging provider:  please sign these orders via discharge navigator,  Select New Orders & click on the button choice - Manage This Unsigned Work.     Thank you for allowing pharmacy to be a part of this patients care.  Almarie Lunger, PharmD, BCPS, BCIDP Infectious Diseases Clinical Pharmacist 11/18/2024 3:37 PM   **Pharmacist phone directory can now be found on [email or web address] ([email or web address]).  Listed under [facility].

## 2024-11-18 NOTE — CV Procedure (Signed)
" ° ° °  TRANSESOPHAGEAL ECHOCARDIOGRAM   NAME:  Brendan [name].    MRN: [id number] DOB:  06/06/1956    ADMIT DATE: 11/11/2024  INDICATIONS: Bacteremia  PROCEDURE:   Informed consent was obtained prior to the procedure. The risks, benefits and alternatives for the procedure were discussed and the patient comprehended these risks.  Risks include, but are not limited to, cough, sore throat, vomiting, nausea, somnolence, esophageal and stomach trauma or perforation, bleeding, low blood pressure, aspiration, pneumonia, infection, trauma to the teeth and death.    Procedural time out performed. The oropharynx was anesthetized with topical 1% benzocaine.    Anesthesia was administered by Dr. Dorethea  and team.    The patient's heart rate, blood pressure, and oxygen saturation are monitored continuously during the procedure.   The transesophageal probe was inserted in the esophagus and stomach without difficulty and multiple views were obtained.   COMPLICATIONS:    There were no immediate complications.  KEY FINDINGS:  No evidence of infective endocarditis. Small pericardial effusion. Left sided pleural effusion.  Full report to follow. Further management per primary team.   Stanly Leavens, MD [address]  [facility]  2:54 PM   "

## 2024-11-18 NOTE — Anesthesia Postprocedure Evaluation (Signed)
"   Anesthesia Post Note  Patient: Brendan [name].  Procedure(s) Performed: TRANSESOPHAGEAL ECHOCARDIOGRAM     Patient location during evaluation: PACU Anesthesia Type: MAC Level of consciousness: awake and alert Pain management: pain level controlled Vital Signs Assessment: post-procedure vital signs reviewed and stable Respiratory status: spontaneous breathing, nonlabored ventilation, respiratory function stable and patient connected to nasal cannula oxygen Cardiovascular status: stable and blood pressure returned to baseline Postop Assessment: no apparent nausea or vomiting Anesthetic complications: no   No notable events documented.  Last Vitals:  Vitals:   11/18/24 1504 11/18/24 1514  BP: (!) 144/77 (!) 158/75  Pulse: 70 70  Resp: 13 12  Temp:    SpO2: 100% 97%    Last Pain:  Vitals:   11/18/24 1514  TempSrc:   PainSc: 0-No pain                 Cordella P Riane Rung      "

## 2024-11-18 NOTE — Progress Notes (Signed)
 Pt refused BG check. Last BG checked at 0950 when patient returned from HD. Patient remains NPO for possible procedure today per cath lab.

## 2024-11-18 NOTE — Progress Notes (Signed)
 Pt off unit to TEE

## 2024-11-18 NOTE — Progress Notes (Signed)
 PT Cancellation Note  Patient Details Name: Brendan [name]. MRN: [id number] DOB: 1955/12/18   Cancelled Treatment:    Reason Eval/Treat Not Completed: Patient at procedure or test/unavailable (Currently pt is off the floor for TEE. will follow up as able and appropriate.)  Dorothyann Maier, DPT, CLT  Acute Rehabilitation Services Office: [phone] (Secure chat preferred)   Dorothyann VEAR Maier 11/18/2024, 2:01 PM

## 2024-11-19 DIAGNOSIS — R7881 Bacteremia: Secondary | ICD-10-CM

## 2024-11-19 DIAGNOSIS — T80211A Bloodstream infection due to central venous catheter, initial encounter: Secondary | ICD-10-CM

## 2024-11-19 DIAGNOSIS — L409 Psoriasis, unspecified: Secondary | ICD-10-CM

## 2024-11-19 LAB — GLUCOSE, CAPILLARY
Glucose-Capillary: 249 mg/dL — ABNORMAL HIGH (ref 70–99)
Glucose-Capillary: 349 mg/dL — ABNORMAL HIGH (ref 70–99)
Glucose-Capillary: 186 mg/dL — ABNORMAL HIGH (ref 70–99)
Glucose-Capillary: 283 mg/dL — ABNORMAL HIGH (ref 70–99)

## 2024-11-19 LAB — RENAL FUNCTION PANEL
Albumin: 2.3 g/dL — ABNORMAL LOW (ref 3.5–5.0)
Anion gap: 15 (ref 5–15)
BUN: 52 mg/dL — ABNORMAL HIGH (ref 8–23)
CO2: 21 mmol/L — ABNORMAL LOW (ref 22–32)
Calcium: 8.1 mg/dL — ABNORMAL LOW (ref 8.9–10.3)
Chloride: 94 mmol/L — ABNORMAL LOW (ref 98–111)
Creatinine, Ser: 5.33 mg/dL — ABNORMAL HIGH (ref 0.61–1.24)
GFR, Estimated: 11 mL/min — ABNORMAL LOW
Glucose, Bld: 272 mg/dL — ABNORMAL HIGH (ref 70–99)
Phosphorus: 4.5 mg/dL (ref 2.5–4.6)
Potassium: 4.2 mmol/L (ref 3.5–5.1)
Sodium: 130 mmol/L — ABNORMAL LOW (ref 135–145)

## 2024-11-19 MED ORDER — PENTAFLUOROPROP-TETRAFLUOROETH EX AERO: 1.0000 | INHALATION_SPRAY | CUTANEOUS | Status: DC | PRN

## 2024-11-19 MED ORDER — ALTEPLASE 2 MG IJ SOLR: 2.0000 mg | Freq: Once | INTRAMUSCULAR | Status: DC | PRN

## 2024-11-19 MED ORDER — SORBITOL 70 % SOLN
30.0000 mL | Freq: Once | Status: DC
  Filled 2024-11-19: qty 30

## 2024-11-19 MED ORDER — ANTICOAGULANT SODIUM CITRATE 4% (200MG/5ML) IV SOLN: 5.0000 mL | Status: DC | PRN

## 2024-11-19 MED ORDER — INSULIN ASPART 100 UNIT/ML IJ SOLN: 0.0000 [IU] | Freq: Three times a day (TID) | INTRAMUSCULAR | Status: DC

## 2024-11-19 MED ORDER — HEPARIN SODIUM (PORCINE) 1000 UNIT/ML IJ SOLN
INTRAMUSCULAR | Status: AC
  Filled 2024-11-19: qty 6

## 2024-11-19 MED ORDER — LIDOCAINE-PRILOCAINE 2.5-2.5 % EX CREA: 1.0000 | TOPICAL_CREAM | CUTANEOUS | Status: DC | PRN

## 2024-11-19 MED ORDER — INSULIN ASPART 100 UNIT/ML IJ SOLN
0.0000 [IU] | Freq: Every day | INTRAMUSCULAR | Status: DC
  Administered 2024-11-19: 3 [IU] via SUBCUTANEOUS
  Administered 2024-11-22: 2 [IU] via SUBCUTANEOUS
  Filled 2024-11-19: qty 2
  Filled 2024-11-19: qty 4

## 2024-11-19 MED ORDER — LACTULOSE 10 GM/15ML PO SOLN: 20.0000 g | Freq: Two times a day (BID) | ORAL | Status: DC | PRN

## 2024-11-19 MED ORDER — SENNOSIDES-DOCUSATE SODIUM 8.6-50 MG PO TABS
1.0000 | ORAL_TABLET | Freq: Two times a day (BID) | ORAL | Status: DC
  Administered 2024-11-19 – 2024-11-22 (×5): 1 via ORAL
  Filled 2024-11-19 (×5): qty 1

## 2024-11-19 MED ORDER — HEPARIN SODIUM (PORCINE) 1000 UNIT/ML DIALYSIS
1000.0000 [IU] | INTRAMUSCULAR | Status: DC | PRN
  Administered 2024-11-19: 4600 [IU]
  Filled 2024-11-19: qty 1

## 2024-11-19 MED ORDER — LIDOCAINE HCL (PF) 1 % IJ SOLN: 5.0000 mL | INTRAMUSCULAR | Status: DC | PRN

## 2024-11-19 NOTE — Progress Notes (Signed)
 Patient place himself on his CPAP machine

## 2024-11-19 NOTE — Progress Notes (Signed)
 " PROGRESS NOTE    Brendan [name].  FMW:[id number] DOB: 1956/03/02 DOA: 11/11/2024 PCP: Rexanne Ingle, MD  Subjective: Patient reports feeling constipated, otherwise denied any other acute complaints.   Hospital Course: 69 year old male with PMH of DM2, HTN, HLD, poor thyroidism, OSA on CPAP, Fournier's gangrene (October 2025), CKD stage V recently started on HD, recent admission for fall with TBI (12/16-12/18/25) who presented to the ED with fatigue. He was found to be febrile with leukocytosis and was managed for sepsis with unclear etiology. On 12/25, there was concern for DKA and he was managed in the progressive unit. 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 His blood cultures grew MSSA and proteus, for which ID was consulted. Potential sources included HD line and psoraitic rash on his palms. His HD line was removed on 12/26, and replaced on 12/30 after a line holiday. He underwent TEE on 12/31, which was negative for endocarditis.    Assessment and Plan:  Sepsis secondary to MSSA and proteus mirabilis bacteremia related to HD line ID following. Continue cefepime  HD cath removed by IR on 12/26 and replaced on 12/30 after a line holiday S/p TEE today with no evidence of endocarditis ID following, plan for cefepime  with HD till 1/9 to complete 2 weeks following line removal, nephrology is aware    DM with hyperglycemia Continue current insulin  regimen    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 Tarfeen (dermatology) for the past 6 years Has not been able to obtain Bimzelx yet holding clobetasol  at this time   AKI now advanced to ESRD-dialyzing via right tunneled cath placed 09/22/2024 Next HD planned for today   HFpEF NYHA IV Will not recommend SGLT2 in the future given groin wounds Continue to hold amlodipine , coreg  and torsemide  for now Resume coreg  tomorrow if BP continues to be elevated     Hypothyroidism Continue Synthroid  125   Recent TBI brain managed nonsurgically during hospitalization 12/16-12/9. 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 Victory Boers 12/26 neurosurgery --absence of mental status changes focal deficit --nothing surgically specific to do in this case   Scrotal wound in the setting of Fournier's gangrene as above Last seen [facility] surgery 10/13/2024 WOC following   BMI 36 Recommend diet/lifestyle modification   Hyponatremia - improved with HD, continue fluid restriction   Constipation - continue bowel regimen with miralax , senokot, PRN lactulose. Was also given sorbitol today, appreciate nephrology     DVT prophylaxis: Place and maintain sequential compression device Start: 11/11/24 1752    Code Status: Full Code Disposition Plan: CIR Reason for continuing need for hospitalization: pending bed availability   Objective: Vitals:   11/19/24 0504 11/19/24 0826 11/19/24 1203 11/19/24 1318  BP: (!) 145/69 (!) 144/76 (!) 150/72 (!) 152/74  Pulse: 68 68 68 67  Resp:  18 18 13   Temp: 97.6 F (36.4 C)   98 F (36.7 C)  TempSrc:      SpO2: 98% 100% 97% 97%  Weight:    124.6 kg  Height:        Intake/Output Summary (Last 24 hours) at 11/19/2024 1406 Last data filed at 11/18/2024 1446 Gross per 24 hour  Intake 50 ml  Output --  Net 50 ml   Filed Weights   11/18/24 0430 11/18/24 0858 11/19/24 1318  Weight: 127.6 kg 123.8 kg 124.6 kg  Examination:  Physical Exam Vitals and nursing note reviewed.  Constitutional:      General: He is not in acute distress. Cardiovascular:     Rate and Rhythm: Normal rate.  Pulmonary:     Effort: No respiratory distress.  Abdominal:     General: There is no distension.  Musculoskeletal:     Right lower leg: No edema.     Left lower leg: No edema.     Data Reviewed: I have personally  reviewed following labs and imaging studies  CBC: Recent Labs  Lab 11/13/24 0729 11/14/24 0449 11/15/24 0326 11/16/24 0355 11/17/24 0246 11/18/24 1019  WBC 19.0* 23.7* 22.4* 19.5* 17.6* 18.0*  NEUTROABS 14.4* 20.1* 18.3*  --   --  12.9*  HGB 10.5* 9.3* 9.5* 9.6* 9.1* 9.6*  HCT 30.2* 27.1* 27.2* 27.7* 25.9* 26.9*  MCV 81.8 81.4 81.9 81.0 79.9* 78.7*  PLT 56* 77* PLATELET CLUMPS NOTED ON SMEAR, UNABLE TO ESTIMATE 67* 93* 125*   Basic Metabolic Panel: Recent Labs  Lab 11/13/24 0426 11/14/24 0449 11/15/24 0326 11/16/24 0355 11/17/24 0246 11/18/24 1018 11/19/24 1039  NA 129* 127* 127* 126* 126* 129* 130*  K 3.8 4.1 4.4 4.3 4.3 4.0 4.2  CL 94* 92* 91* 91* 92* 92* 94*  CO2 22 24 25 23 23 23  21*  GLUCOSE 218* 262* 298* 328* 265* 240* 272*  BUN 29* 44* 54* 59* 66* 40* 52*  CREATININE 3.18* 4.54* 5.30* 5.71* 6.18* 4.09* 5.33*  CALCIUM 8.2* 8.1* 8.1* 8.3* 8.2* 8.0* 8.1*  PHOS 2.4* 3.1 3.1  --   --   --  4.5   GFR: Estimated Creatinine Clearance: 18.4 mL/min (A) (by C-G formula based on SCr of 5.33 mg/dL (H)). Liver Function Tests: Recent Labs  Lab 11/15/24 0326 11/16/24 0355 11/17/24 0246 11/18/24 1018 11/19/24 1039  AST  --  <10* 11* 11*  --   ALT  --  6 7 6   --   ALKPHOS  --  131* 122 124  --   BILITOT  --  0.6 0.5 0.6  --   PROT  --  6.1* 6.0* 6.3*  --   ALBUMIN  2.3* 2.3* 2.3* 2.4* 2.3*   No results for input(s): LIPASE, AMYLASE in the last 168 hours. No results for input(s): AMMONIA in the last 168 hours. Coagulation Profile: No results for input(s): INR, PROTIME in the last 168 hours. Cardiac Enzymes: No results for input(s): CKTOTAL, CKMB, CKMBINDEX, TROPONINI in the last 168 hours. ProBNP, BNP (last 5 results) Recent Labs    01/20/24 1042 08/28/24 1613 09/15/24 1846  PROBNP  --  6,038.0*  --   BNP 295.6*  --  728.9*   HbA1C: No results for input(s): HGBA1C in the last 72 hours. CBG: Recent Labs  Lab 11/18/24 0950  11/18/24 1627 11/18/24 2116 11/19/24 0825 11/19/24 1203  GLUCAP 226* 223* 213* 249* 349*   Lipid Profile: No results for input(s): CHOL, HDL, LDLCALC, TRIG, CHOLHDL, LDLDIRECT in the last 72 hours. Thyroid  Function Tests: No results for input(s): TSH, T4TOTAL, FREET4, T3FREE, THYROIDAB in the last 72 hours. Anemia Panel: No results for input(s): VITAMINB12, FOLATE, FERRITIN, TIBC, IRON , RETICCTPCT in the last 72 hours. Sepsis Labs: No results for input(s): PROCALCITON, LATICACIDVEN in the last 168 hours.  Recent Results (from the past 240 hours)  Resp panel by RT-PCR (RSV, Flu A&B, Covid) Anterior Nasal Swab     Status: None   Collection Time: 11/11/24 11:25 AM   Specimen: Anterior Nasal Swab  Result Value Ref Range Status   SARS Coronavirus 2 by RT PCR NEGATIVE NEGATIVE Final   Influenza A by PCR NEGATIVE NEGATIVE Final   Influenza B by PCR NEGATIVE NEGATIVE Final    Comment: (NOTE) The Xpert Xpress SARS-CoV-2/FLU/RSV plus assay is intended as an aid in the diagnosis of influenza from Nasopharyngeal swab specimens and should not be used as a sole basis for treatment. Nasal washings and aspirates are unacceptable for Xpert Xpress SARS-CoV-2/FLU/RSV testing.  Fact Sheet for Patients: bloggercourse.com  Fact Sheet for Healthcare Providers: seriousbroker.it  This test is not yet approved or cleared by the [place]  FDA and has been authorized for detection and/or diagnosis of SARS-CoV-2 by FDA under an Emergency Use Authorization (EUA). This EUA will remain in effect (meaning this test can be used) for the duration of the COVID-19 declaration under Section 564(b)(1) of the Act, 21 U.S.C. section 360bbb-3(b)(1), unless the authorization is terminated or revoked.     Resp Syncytial Virus by PCR NEGATIVE NEGATIVE Final    Comment: (NOTE) Fact Sheet for  Patients: bloggercourse.com  Fact Sheet for Healthcare Providers: seriousbroker.it  This test is not yet approved or cleared by the [place]  FDA and has been authorized for detection and/or diagnosis of SARS-CoV-2 by FDA under an Emergency Use Authorization (EUA). This EUA will remain in effect (meaning this test can be used) for the duration of the COVID-19 declaration under Section 564(b)(1) of the Act, 21 U.S.C. section 360bbb-3(b)(1), unless the authorization is terminated or revoked.  Performed at [facility], [address] [place], [place] [address]   Blood culture (routine x 2)     Status: Abnormal   Collection Time: 11/11/24  1:29 PM   Specimen: BLOOD  Result Value Ref Range Status   Specimen Description BLOOD SITE NOT SPECIFIED  Final   Special Requests   Final    BOTTLES DRAWN AEROBIC AND ANAEROBIC Blood Culture results may not be optimal due to an inadequate volume of blood received in culture bottles   Culture  Setup Time   Final    GRAM POSITIVE COCCI IN CLUSTERS GRAM NEGATIVE RODS IN BOTH AEROBIC AND ANAEROBIC BOTTLES CRITICAL RESULT CALLED TO, READ BACK BY AND VERIFIED WITH: PHARMD G ABBOTT 11/12/2024 @ 0347 BY AB Performed at [facility], [address], [place], [place] [address]    Culture PROTEUS MIRABILIS STAPHYLOCOCCUS AUREUS  (A)  Final   Report Status 11/15/2024 FINAL  Final   Organism ID, Bacteria PROTEUS MIRABILIS  Final   Organism ID, Bacteria STAPHYLOCOCCUS AUREUS  Final      Susceptibility   Proteus mirabilis - MIC*    AMPICILLIN  <=2 SENSITIVE Sensitive     CEFAZOLIN  (NON-URINE) 4 INTERMEDIATE Intermediate     CEFEPIME  <=0.12 SENSITIVE Sensitive     ERTAPENEM <=0.12 SENSITIVE Sensitive     CEFTRIAXONE  <=0.25 SENSITIVE Sensitive     CIPROFLOXACIN <=0.06 SENSITIVE Sensitive     GENTAMICIN <=1 SENSITIVE Sensitive     MEROPENEM 1 SENSITIVE Sensitive     TRIMETH/SULFA <=20  SENSITIVE Sensitive     AMPICILLIN /SULBACTAM <=2 SENSITIVE Sensitive     PIP/TAZO Value in next row Sensitive      <=4 SENSITIVEThis is a modified FDA-approved test that has been validated and its performance characteristics determined by the reporting laboratory.  This laboratory is certified under the Clinical Laboratory Improvement Amendments CLIA as qualified to perform high complexity clinical laboratory testing.    * PROTEUS MIRABILIS  Staphylococcus aureus - MIC*    CIPROFLOXACIN Value in next row Sensitive      <=4 SENSITIVEThis is a modified FDA-approved test that has been validated and its performance characteristics determined by the reporting laboratory.  This laboratory is certified under the Clinical Laboratory Improvement Amendments CLIA as qualified to perform high complexity clinical laboratory testing.    ERYTHROMYCIN Value in next row Resistant      <=4 SENSITIVEThis is a modified FDA-approved test that has been validated and its performance characteristics determined by the reporting laboratory.  This laboratory is certified under the Clinical Laboratory Improvement Amendments CLIA as qualified to perform high complexity clinical laboratory testing.    GENTAMICIN Value in next row Sensitive      <=4 SENSITIVEThis is a modified FDA-approved test that has been validated and its performance characteristics determined by the reporting laboratory.  This laboratory is certified under the Clinical Laboratory Improvement Amendments CLIA as qualified to perform high complexity clinical laboratory testing.    OXACILLIN Value in next row Sensitive      <=4 SENSITIVEThis is a modified FDA-approved test that has been validated and its performance characteristics determined by the reporting laboratory.  This laboratory is certified under the Clinical Laboratory Improvement Amendments CLIA as qualified to perform high complexity clinical laboratory testing.    TETRACYCLINE Value in next row  Resistant      <=4 SENSITIVEThis is a modified FDA-approved test that has been validated and its performance characteristics determined by the reporting laboratory.  This laboratory is certified under the Clinical Laboratory Improvement Amendments CLIA as qualified to perform high complexity clinical laboratory testing.    VANCOMYCIN  Value in next row Sensitive      <=4 SENSITIVEThis is a modified FDA-approved test that has been validated and its performance characteristics determined by the reporting laboratory.  This laboratory is certified under the Clinical Laboratory Improvement Amendments CLIA as qualified to perform high complexity clinical laboratory testing.    TRIMETH/SULFA Value in next row Sensitive      <=4 SENSITIVEThis is a modified FDA-approved test that has been validated and its performance characteristics determined by the reporting laboratory.  This laboratory is certified under the Clinical Laboratory Improvement Amendments CLIA as qualified to perform high complexity clinical laboratory testing.    CLINDAMYCIN Value in next row Resistant      <=4 SENSITIVEThis is a modified FDA-approved test that has been validated and its performance characteristics determined by the reporting laboratory.  This laboratory is certified under the Clinical Laboratory Improvement Amendments CLIA as qualified to perform high complexity clinical laboratory testing.    RIFAMPIN Value in next row Sensitive      <=4 SENSITIVEThis is a modified FDA-approved test that has been validated and its performance characteristics determined by the reporting laboratory.  This laboratory is certified under the Clinical Laboratory Improvement Amendments CLIA as qualified to perform high complexity clinical laboratory testing.    Inducible Clindamycin Value in next row Sensitive      <=4 SENSITIVEThis is a modified FDA-approved test that has been validated and its performance characteristics determined by the reporting  laboratory.  This laboratory is certified under the Clinical Laboratory Improvement Amendments CLIA as qualified to perform high complexity clinical laboratory testing.    LINEZOLID  Value in next row Sensitive      <=4 SENSITIVEThis is a modified FDA-approved test that has been validated and its performance characteristics determined by the reporting laboratory.  This laboratory is certified under the Clinical Laboratory  Improvement Amendments CLIA as qualified to perform high complexity clinical laboratory testing.    * STAPHYLOCOCCUS AUREUS  Blood Culture ID Panel (Reflexed)     Status: Abnormal   Collection Time: 11/11/24  1:29 PM  Result Value Ref Range Status   Enterococcus faecalis NOT DETECTED NOT DETECTED Corrected   Enterococcus Faecium NOT DETECTED NOT DETECTED Corrected   Listeria monocytogenes NOT DETECTED NOT DETECTED Corrected   Staphylococcus species DETECTED (A) NOT DETECTED Corrected    Comment: CRITICAL RESULT CALLED TO, READ BACK BY AND VERIFIED WITH: PHARMD G ABBOTT 11/12/2024 @ 0347 BY AB    Staphylococcus aureus (BCID) DETECTED (A) NOT DETECTED Corrected    Comment: CRITICAL RESULT CALLED TO, READ BACK BY AND VERIFIED WITH: PHARMD G ABBOTT 11/12/2024 @ 0347 BY AB    Staphylococcus epidermidis NOT DETECTED NOT DETECTED Corrected   Staphylococcus lugdunensis NOT DETECTED NOT DETECTED Corrected   Streptococcus species NOT DETECTED NOT DETECTED Corrected   Streptococcus agalactiae NOT DETECTED NOT DETECTED Corrected   Streptococcus pneumoniae NOT DETECTED NOT DETECTED Corrected   Streptococcus pyogenes NOT DETECTED NOT DETECTED Corrected   A.calcoaceticus-baumannii NOT DETECTED NOT DETECTED Corrected   Bacteroides fragilis NOT DETECTED NOT DETECTED Corrected   Enterobacterales DETECTED (A) NOT DETECTED Corrected    Comment: Enterobacterales represent a large order of gram negative bacteria, not a single organism. CRITICAL RESULT CALLED TO, READ BACK BY AND VERIFIED  WITH: PHARMD G ABBOTT 11/12/2024 @ 0347 BY AB    Enterobacter cloacae complex NOT DETECTED NOT DETECTED Corrected   Escherichia coli NOT DETECTED NOT DETECTED Corrected   Klebsiella aerogenes NOT DETECTED NOT DETECTED Corrected   Klebsiella oxytoca NOT DETECTED NOT DETECTED Corrected   Klebsiella pneumoniae NOT DETECTED NOT DETECTED Corrected   Proteus species DETECTED (A) NOT DETECTED Corrected    Comment: CRITICAL RESULT CALLED TO, READ BACK BY AND VERIFIED WITH: PHARMD G ABBOTT 11/12/2024 @ 0347 BY AB    Salmonella species NOT DETECTED NOT DETECTED Corrected   Serratia marcescens NOT DETECTED NOT DETECTED Corrected   Haemophilus influenzae NOT DETECTED NOT DETECTED Corrected   Neisseria meningitidis NOT DETECTED NOT DETECTED Corrected   Pseudomonas aeruginosa NOT DETECTED NOT DETECTED Corrected   Stenotrophomonas maltophilia NOT DETECTED NOT DETECTED Corrected   Candida albicans NOT DETECTED NOT DETECTED Corrected   Candida auris NOT DETECTED NOT DETECTED Corrected   Candida glabrata NOT DETECTED NOT DETECTED Corrected   Candida krusei NOT DETECTED NOT DETECTED Corrected   Candida parapsilosis NOT DETECTED NOT DETECTED Corrected   Candida tropicalis NOT DETECTED NOT DETECTED Corrected   Cryptococcus neoformans/gattii NOT DETECTED NOT DETECTED Corrected   CTX-M ESBL NOT DETECTED NOT DETECTED Corrected   Carbapenem resistance IMP NOT DETECTED NOT DETECTED Corrected   Carbapenem resistance KPC NOT DETECTED NOT DETECTED Corrected   Meth resistant mecA/C and MREJ NOT DETECTED NOT DETECTED Corrected   Carbapenem resistance NDM NOT DETECTED NOT DETECTED Corrected   Carbapenem resist OXA 48 LIKE NOT DETECTED NOT DETECTED Corrected   Carbapenem resistance VIM NOT DETECTED NOT DETECTED Corrected    Comment: Performed at [facility], [address], [place], [place] [address]  Blood culture (routine x 2)     Status: None   Collection Time: 11/11/24  8:57 PM   Specimen: BLOOD LEFT  HAND  Result Value Ref Range Status   Specimen Description BLOOD LEFT HAND  Final   Special Requests   Final    BOTTLES DRAWN AEROBIC AND ANAEROBIC Blood Culture adequate volume  Culture   Final    NO GROWTH 5 DAYS Performed at [facility], [address] [place], [place] [address]    Report Status 11/16/2024 FINAL  Final  MRSA Next Gen by PCR, Nasal     Status: None   Collection Time: 11/11/24  9:48 PM   Specimen: Nasal Mucosa; Nasal Swab  Result Value Ref Range Status   MRSA by PCR Next Gen NOT DETECTED NOT DETECTED Final    Comment: (NOTE) The GeneXpert MRSA Assay (FDA approved for NASAL specimens only), is one component of a comprehensive MRSA colonization surveillance program. It is not intended to diagnose MRSA infection nor to guide or monitor treatment for MRSA infections. Test performance is not FDA approved in patients less than 93 years old. Performed at [facility], [address], [place], [place] [address]   Respiratory (~20 pathogens) panel by PCR     Status: None   Collection Time: 11/12/24 10:28 AM   Specimen: Nasopharyngeal Swab; Respiratory  Result Value Ref Range Status   Adenovirus NOT DETECTED NOT DETECTED Final   Coronavirus 229E NOT DETECTED NOT DETECTED Final    Comment: (NOTE) The Coronavirus on the Respiratory Panel, DOES NOT test for the novel  Coronavirus (2019 nCoV)    Coronavirus HKU1 NOT DETECTED NOT DETECTED Final   Coronavirus NL63 NOT DETECTED NOT DETECTED Final   Coronavirus OC43 NOT DETECTED NOT DETECTED Final   Metapneumovirus NOT DETECTED NOT DETECTED Final   Rhinovirus / Enterovirus NOT DETECTED NOT DETECTED Final   Influenza A NOT DETECTED NOT DETECTED Final   Influenza B NOT DETECTED NOT DETECTED Final   Parainfluenza Virus 1 NOT DETECTED NOT DETECTED Final   Parainfluenza Virus 2 NOT DETECTED NOT DETECTED Final   Parainfluenza Virus 3 NOT DETECTED NOT DETECTED Final   Parainfluenza Virus 4 NOT DETECTED NOT DETECTED  Final   Respiratory Syncytial Virus NOT DETECTED NOT DETECTED Final   Bordetella pertussis NOT DETECTED NOT DETECTED Final   Bordetella Parapertussis NOT DETECTED NOT DETECTED Final   Chlamydophila pneumoniae NOT DETECTED NOT DETECTED Final   Mycoplasma pneumoniae NOT DETECTED NOT DETECTED Final    Comment: Performed at [facility], [address], [place], [place] [address]  Culture, blood (Routine X 2) w Reflex to ID Panel     Status: None   Collection Time: 11/13/24  7:29 AM   Specimen: BLOOD RIGHT HAND  Result Value Ref Range Status   Specimen Description BLOOD RIGHT HAND  Final   Special Requests   Final    BOTTLES DRAWN AEROBIC AND ANAEROBIC Blood Culture results may not be optimal due to an inadequate volume of blood received in culture bottles   Culture   Final    NO GROWTH 5 DAYS Performed at [facility], [address] [place], [place] [address]    Report Status 11/18/2024 FINAL  Final  Culture, blood (Routine X 2) w Reflex to ID Panel     Status: None   Collection Time: 11/13/24  7:29 AM   Specimen: BLOOD LEFT ARM  Result Value Ref Range Status   Specimen Description BLOOD LEFT ARM  Final   Special Requests   Final    BOTTLES DRAWN AEROBIC AND ANAEROBIC Blood Culture results may not be optimal due to an inadequate volume of blood received in culture bottles   Culture   Final    NO GROWTH 5 DAYS Performed at [facility], [address] [place], [place] [address]    Report Status 11/18/2024 FINAL  Final     Radiology Studies: ECHO TEE Result Date: 11/18/2024    TRANSESOPHOGEAL ECHO REPORT   Patient Name:   Brendan [name]. Date of Exam: 11/18/2024 Medical Rec #:  [id number]       Height:       74.0 in Accession #:    [id number]      Weight:       272.9 lb Date of Birth:  15-Aug-1956       BSA:          2.480 m Patient Age:    68 years        BP:           182/75 mmHg Patient Gender: M               HR:           73 bpm. Exam Location:  Inpatient  Procedure: Transesophageal Echo, Color Doppler and Cardiac Doppler (Both            Spectral and Color Flow Doppler were utilized during procedure). Indications:    Bacteremia  History:        Patient has prior history of Echocardiogram examinations, most                 recent 11/12/2024.  Sonographer:    Tinnie Gosling RDCS Referring Phys: [phone] STEPHEN K CHIU PROCEDURE: After discussion of the risks and benefits of a TEE, an informed consent was obtained from the patient. The transesophogeal probe was passed without difficulty through the esophogus of the patient. Sedation performed by different physician. The patient was monitored while under deep sedation. Anesthestetic sedation was provided intravenously by Anesthesiology: 148.56mg  of Propofol . The patient developed no complications during the procedure.  IMPRESSIONS  1. Left ventricular ejection fraction, by estimation, is 55 to 60%. The left ventricle has normal function.  2. Right ventricular systolic function is normal. The right ventricular size is normal.  3. No left atrial/left atrial appendage thrombus was detected.  4. A small pericardial effusion is present. The pericardial effusion is circumferential. Moderate pleural effusion in the left lateral region.  5. The mitral valve is normal in structure. Trivial mitral valve regurgitation. No evidence of mitral stenosis.  6. The aortic valve is tricuspid. Aortic valve regurgitation is trivial. No aortic stenosis is present.  7. The inferior vena cava is normal in size with greater than 50% respiratory variability, suggesting right atrial pressure of 3 mmHg.  8. Agitated saline contrast bubble study was negative, with no evidence of any interatrial shunt. FINDINGS  Left Ventricle: Left ventricular ejection fraction, by estimation, is 55 to 60%. The left ventricle has normal function. The left ventricular internal cavity size was normal in size. Right Ventricle: The right ventricular size is normal. No  increase in right ventricular wall thickness. Right ventricular systolic function is normal. Left Atrium: Left atrial size was normal in size. No left atrial/left atrial appendage thrombus was detected. Right Atrium: Right atrial size was normal in size. Pericardium: A small pericardial effusion is present. The pericardial effusion is circumferential. Mitral Valve: The mitral valve is normal in structure. Trivial mitral valve regurgitation. No evidence of mitral valve stenosis. Tricuspid Valve: The tricuspid valve is normal in structure. Tricuspid valve regurgitation is not demonstrated. No evidence of tricuspid stenosis. Aortic Valve: The aortic valve is tricuspid. Aortic valve regurgitation is trivial. No aortic stenosis is present.  Pulmonic Valve: The pulmonic valve was grossly normal. Pulmonic valve regurgitation is not visualized. Aorta: The aortic root and ascending aorta are structurally normal, with no evidence of dilitation. Venous: The inferior vena cava is normal in size with greater than 50% respiratory variability, suggesting right atrial pressure of 3 mmHg. IAS/Shunts: There is right bowing of the interatrial septum, suggestive of elevated left atrial pressure. No atrial level shunt detected by color flow Doppler. Agitated saline contrast was given intravenously to evaluate for intracardiac shunting. Agitated saline contrast bubble study was negative, with no evidence of any interatrial shunt. Additional Comments: A venous catheter is visualized. There is a moderate pleural effusion in the left lateral region. Stanly Leavens MD Electronically signed by Stanly Leavens MD Signature Date/Time: 11/18/2024/5:17:58 PM    Final    EP STUDY Result Date: 11/18/2024 See surgical note for result.  IR TUNNELED CENTRAL VENOUS CATH [facility] IMG Result Date: 11/17/2024 INDICATION: Right jugular dialysis catheter was recently removed for a line holiday. Patient needs a new dialysis access. EXAM:  FLUOROSCOPIC AND ULTRASOUND GUIDED PLACEMENT OF A TUNNELED DIALYSIS CATHETER Physician: Juliene SAUNDERS. Philip, MD MEDICATIONS: Ancef  2 g; The antibiotic was administered within an appropriate time interval prior to skin puncture. ANESTHESIA/SEDATION: Moderate (conscious) sedation was employed during this procedure. A total of Versed  1 mg and fentanyl  50 mcg was administered intravenously at the order of the provider performing the procedure. Total intra-service moderate sedation time: 24 minutes. Patient's level of consciousness and vital signs were monitored continuously by radiology nurse throughout the procedure under the supervision of the provider performing the procedure. FLUOROSCOPY TIME:  Radiation Exposure Index (as provided by the fluoroscopic device): 26 mGy Kerma COMPLICATIONS: None immediate. PROCEDURE: Informed consent was obtained for placement of a tunneled dialysis catheter. The patient was placed supine on the interventional table. Ultrasound confirmed a patent left internal jugular vein. Ultrasound image obtained for documentation. The left neck and chest was prepped and draped in a sterile fashion. Maximal barrier sterile technique was utilized including caps, mask, sterile gowns, sterile gloves, sterile drape, hand hygiene and skin antiseptic. The left neck was anesthetized with 1% lidocaine . A small incision was made with #11 blade scalpel. A 21 gauge needle directed into the left internal jugular vein with ultrasound guidance. A micropuncture dilator set was placed. A 33 cm tip to cuff Palindrome catheter was selected. The skin below the left clavicle was anesthetized and a small incision was made with an #11 blade scalpel. A subcutaneous tunnel was formed to the vein dermatotomy site. The catheter was brought through the tunnel. The vein dermatotomy site was dilated to accommodate a peel-away sheath over a wire. The catheter was placed through the peel-away sheath and directed into the central venous  structures. The tip of the catheter was placed in the right atrium with fluoroscopy. Fluoroscopic images were obtained for documentation. Both lumens were found to aspirate and flush well. The proper amount of heparin  was flushed in both lumens. The vein dermatotomy site was closed using a single layer of absorbable suture and Dermabond. Gel-Foam was placed in the subcutaneous tract. The catheter was secured to the skin using Prolene suture. IMPRESSION: Successful placement of a left jugular tunneled dialysis catheter using ultrasound and fluoroscopic guidance. Electronically Signed   By: Juliene Philip M.D.   On: 11/17/2024 20:16    Scheduled Meds:  calcitRIOL   0.25 mcg Oral Daily   Chlorhexidine  Gluconate Cloth  6 each Topical Daily   Chlorhexidine  Gluconate Cloth  6 each Topical Q0600   darbepoetin (ARANESP ) injection - DIALYSIS  100 mcg Subcutaneous Q Sat-1800   insulin  aspart  0-6 Units Subcutaneous TID WC   insulin  aspart  4 Units Subcutaneous TID WC   insulin  glargine  15 Units Subcutaneous Daily   levothyroxine   125 mcg Oral q AM   polyethylene glycol  17 g Oral BID   senna-docusate  1 tablet Oral BID   simvastatin   20 mg Oral QPM   sodium chloride  flush  10-40 mL Intracatheter Q12H   sodium chloride  flush  3 mL Intravenous Q12H   sorbitol  30 mL Oral Once   Continuous Infusions:  anticoagulant sodium citrate       ceFAZolin  (ANCEF ) IV     ceFEPime  (MAXIPIME ) IV       LOS: 8 days   Time spent: 38 minutes  Casimer Dare, MD  [facility]  11/19/2024, 2:06 PM   "

## 2024-11-19 NOTE — Progress Notes (Signed)
 Brendan [name]  Assessment/ Plan: Pt is a 69 y.o. yo male with AKI on HD, HTN, T2DM, recent admit for head contusion s/p fall who is being admitted with fever, leukocytosis, and weakness due to sepsis.  He has progressed to ESRD  # Sepsis due to MSSA and Proteus bacteremia: Exact source unknown but concern for [facility].   -TDC removed by IR on 12/26 -abx per ID and primary team.  Currently on cefepime , s/p TEE on 12/31, ID is following. - [name] 11/13/24 cultures negative - Per ID, plans are for cefepime  three times a week after HD through 1/9 to complete 2 weeks of abx following line removal  # ESRD MTuThF  - Had been at [facility].  His tunneled catheter was removed on 12/26 for line holiday and then replaced on 12/30 - [name] that his AKI has progressed to ESRD ------- - HD per TTS schedule  - ordered renal panel daily   # Anemia of CKD:   - Continue Aranesp  100 mcg every Sat and monitor hemoglobin  # Secondary hyperparathyroidism: phos controlled, not on binders.  on calcitriol .  # HTN/volume: optimize volume with HD and recommend fluid restrictions of 1.5 liters/day   # Hyponatremia  - secondary to decreased free water excretion - improved with HD   # Constipation  - Sorbitol once - he's been on miralax  at BID dosing - team has lactulose PRN as well    Disposition - continue inpatient monitoring     Subjective:    No UOP charted over 12/31.  Last HD overnight 12/30-12/31 ending early AM.  He feels ok this AM.  Had been resting and wearing CPAP on arrival and took off for our conversation.     Review of systems:     Constipation - states no BM since late last week  Denies shortness of breath or chest pain  Denies n/v    Objective Vital signs in last 24 hours: Vitals:   11/18/24 1633 11/18/24 2105 11/19/24 0504 11/19/24 0826  BP: (!) 162/85 (!) 143/84 (!) 145/69 (!) 144/76  Pulse: 69 71 68 68  Resp: 14 15  18   Temp:  98.2 F (36.8 C)  97.6 F (36.4 C)   TempSrc:  Oral    SpO2: 99% 96% 98% 100%  Weight:      Height:       Weight change: -3.8 kg  Intake/Output Summary (Last 24 hours) at 11/19/2024 0924 Last data filed at 11/18/2024 1446 Gross per 24 hour  Intake 50 ml  Output --  Net 50 ml     Labs: RENAL PANEL Recent Labs  Lab 11/13/24 0426 11/14/24 0449 11/15/24 0326 11/16/24 0355 11/17/24 0246 11/18/24 1018  NA 129* 127* 127* 126* 126* 129*  K 3.8 4.1 4.4 4.3 4.3 4.0  CL 94* 92* 91* 91* 92* 92*  CO2 22 24 25 23 23 23   GLUCOSE 218* 262* 298* 328* 265* 240*  BUN 29* 44* 54* 59* 66* 40*  CREATININE 3.18* 4.54* 5.30* 5.71* 6.18* 4.09*  CALCIUM 8.2* 8.1* 8.1* 8.3* 8.2* 8.0*  PHOS 2.4* 3.1 3.1  --   --   --   ALBUMIN  2.3* 2.2* 2.3* 2.3* 2.3* 2.4*    Liver Function Tests: Recent Labs  Lab 11/16/24 0355 11/17/24 0246 11/18/24 1018  AST <10* 11* 11*  ALT 6 7 6   ALKPHOS 131* 122 124  BILITOT 0.6 0.5 0.6  PROT 6.1* 6.0* 6.3*  ALBUMIN  2.3* 2.3* 2.4*  No results for input(s): LIPASE, AMYLASE in the last 168 hours. No results for input(s): AMMONIA in the last 168 hours. CBC: Recent Labs    03/10/24 1213 08/28/24 1613 09/17/24 0322 09/18/24 1541 11/14/24 0449 11/15/24 0326 11/16/24 0355 11/17/24 0246 11/18/24 1019  HGB 12.2*   < > 8.8*   < > 9.3* 9.5* 9.6* 9.1* 9.6*  MCV 83.9   < > 82.4   < > 81.4 81.9 81.0 79.9* 78.7*  VITAMINB12  --   --  443  --   --   --   --   --   --   FOLATE  --   --  5.3*  --   --   --   --   --   --   FERRITIN 91  --  40  --   --   --   --   --   --   TIBC 218*  --  190*  --   --   --   --   --   --   IRON  40*  --  39*  --   --   --   --   --   --   RETICCTPCT  --   --  2.3  --   --   --   --   --   --    < > = values in this interval not displayed.    Cardiac Enzymes: No results for input(s): CKTOTAL, CKMB, CKMBINDEX, TROPONINI in the last 168 hours. CBG: Recent Labs  Lab 11/18/24 0406 11/18/24 0950 11/18/24 1627 11/18/24 2116  11/19/24 0825  GLUCAP 284* 226* 223* 213* 249*    Iron  Studies: No results for input(s): IRON , TIBC, TRANSFERRIN, FERRITIN in the last 72 hours. Studies/Results: ECHO TEE Result Date: 11/18/2024    TRANSESOPHOGEAL ECHO REPORT   Patient Name:   Brendan [name]. Date of Exam: 11/18/2024 Medical Rec #:  [id number]       Height:       74.0 in Accession #:    [id number]      Weight:       272.9 lb Date of Birth:  July 17, 1956       BSA:          2.480 m Patient Age:    68 years        BP:           182/75 mmHg Patient Gender: M               HR:           73 bpm. Exam Location:  Inpatient Procedure: Transesophageal Echo, Color Doppler and Cardiac Doppler (Both            Spectral and Color Flow Doppler were utilized during procedure). Indications:    Bacteremia  History:        Patient has prior history of Echocardiogram examinations, most                 recent 11/12/2024.  Sonographer:    Tinnie Gosling RDCS Referring Phys: [phone] STEPHEN K CHIU PROCEDURE: After discussion of the risks and benefits of a TEE, an informed consent was obtained from the patient. The transesophogeal probe was passed without difficulty through the esophogus of the patient. Sedation performed by different physician. The patient was monitored while under deep sedation. Anesthestetic sedation was provided intravenously by Anesthesiology: 148.56mg  of Propofol . The patient developed no complications during the procedure.  IMPRESSIONS  1. Left ventricular ejection fraction, by estimation, is 55 to 60%. The left ventricle has normal function.  2. Right ventricular systolic function is normal. The right ventricular size is normal.  3. No left atrial/left atrial appendage thrombus was detected.  4. A small pericardial effusion is present. The pericardial effusion is circumferential. Moderate pleural effusion in the left lateral region.  5. The mitral valve is normal in structure. Trivial mitral valve regurgitation. No evidence of mitral  stenosis.  6. The aortic valve is tricuspid. Aortic valve regurgitation is trivial. No aortic stenosis is present.  7. The inferior vena cava is normal in size with greater than 50% respiratory variability, suggesting right atrial pressure of 3 mmHg.  8. Agitated saline contrast bubble study was negative, with no evidence of any interatrial shunt. FINDINGS  Left Ventricle: Left ventricular ejection fraction, by estimation, is 55 to 60%. The left ventricle has normal function. The left ventricular internal cavity size was normal in size. Right Ventricle: The right ventricular size is normal. No increase in right ventricular wall thickness. Right ventricular systolic function is normal. Left Atrium: Left atrial size was normal in size. No left atrial/left atrial appendage thrombus was detected. Right Atrium: Right atrial size was normal in size. Pericardium: A small pericardial effusion is present. The pericardial effusion is circumferential. Mitral Valve: The mitral valve is normal in structure. Trivial mitral valve regurgitation. No evidence of mitral valve stenosis. Tricuspid Valve: The tricuspid valve is normal in structure. Tricuspid valve regurgitation is not demonstrated. No evidence of tricuspid stenosis. Aortic Valve: The aortic valve is tricuspid. Aortic valve regurgitation is trivial. No aortic stenosis is present. Pulmonic Valve: The pulmonic valve was grossly normal. Pulmonic valve regurgitation is not visualized. Aorta: The aortic root and ascending aorta are structurally normal, with no evidence of dilitation. Venous: The inferior vena cava is normal in size with greater than 50% respiratory variability, suggesting right atrial pressure of 3 mmHg. IAS/Shunts: There is right bowing of the interatrial septum, suggestive of elevated left atrial pressure. No atrial level shunt detected by color flow Doppler. Agitated saline contrast was given intravenously to evaluate for intracardiac shunting. Agitated  saline contrast bubble study was negative, with no evidence of any interatrial shunt. Additional Comments: A venous catheter is visualized. There is a moderate pleural effusion in the left lateral region. Stanly Leavens MD Electronically signed by Stanly Leavens MD Signature Date/Time: 11/18/2024/5:17:58 PM    Final    EP STUDY Result Date: 11/18/2024 See surgical [name] for result.  IR TUNNELED CENTRAL VENOUS CATH [facility] IMG Result Date: 11/17/2024 INDICATION: Right jugular dialysis catheter was recently removed for a line holiday. Patient needs a new dialysis access. EXAM: FLUOROSCOPIC AND ULTRASOUND GUIDED PLACEMENT OF A TUNNELED DIALYSIS CATHETER Physician: Juliene SAUNDERS. Philip, MD MEDICATIONS: Ancef  2 g; The antibiotic was administered within an appropriate time interval prior to skin puncture. ANESTHESIA/SEDATION: Moderate (conscious) sedation was employed during this procedure. A total of Versed  1 mg and fentanyl  50 mcg was administered intravenously at the order of the provider performing the procedure. Total intra-service moderate sedation time: 24 minutes. Patient's level of consciousness and vital signs were monitored continuously by radiology nurse throughout the procedure under the supervision of the provider performing the procedure. FLUOROSCOPY TIME:  Radiation Exposure Index (as provided by the fluoroscopic device): 26 mGy Kerma COMPLICATIONS: None immediate. PROCEDURE: Informed consent was obtained for placement of a tunneled dialysis catheter. The patient was placed supine on the interventional table. Ultrasound confirmed  a patent left internal jugular vein. Ultrasound image obtained for documentation. The left neck and chest was prepped and draped in a sterile fashion. Maximal barrier sterile technique was utilized including caps, mask, sterile gowns, sterile gloves, sterile drape, hand hygiene and skin antiseptic. The left neck was anesthetized with 1% lidocaine . A small incision was  made with #11 blade scalpel. A 21 gauge needle directed into the left internal jugular vein with ultrasound guidance. A micropuncture dilator set was placed. A 33 cm tip to cuff Palindrome catheter was selected. The skin below the left clavicle was anesthetized and a small incision was made with an #11 blade scalpel. A subcutaneous tunnel was formed to the vein dermatotomy site. The catheter was brought through the tunnel. The vein dermatotomy site was dilated to accommodate a peel-away sheath over a wire. The catheter was placed through the peel-away sheath and directed into the central venous structures. The tip of the catheter was placed in the right atrium with fluoroscopy. Fluoroscopic images were obtained for documentation. Both lumens were found to aspirate and flush well. The proper amount of heparin  was flushed in both lumens. The vein dermatotomy site was closed using a single layer of absorbable suture and Dermabond. Gel-Foam was placed in the subcutaneous tract. The catheter was secured to the skin using Prolene suture. IMPRESSION: Successful placement of a left jugular tunneled dialysis catheter using ultrasound and fluoroscopic guidance. Electronically Signed   By: Juliene Balder M.D.   On: 11/17/2024 20:16     Medications: Infusions:   ceFAZolin  (ANCEF ) IV     ceFEPime  (MAXIPIME ) IV      Scheduled Medications:  calcitRIOL   0.25 mcg Oral Daily   Chlorhexidine  Gluconate Cloth  6 each Topical Daily   Chlorhexidine  Gluconate Cloth  6 each Topical Q0600   darbepoetin (ARANESP ) injection - DIALYSIS  100 mcg Subcutaneous Q Sat-1800   insulin  aspart  0-6 Units Subcutaneous TID WC   insulin  aspart  4 Units Subcutaneous TID WC   insulin  glargine  15 Units Subcutaneous Daily   levothyroxine   125 mcg Oral q AM   polyethylene glycol  17 g Oral BID   senna-docusate  1 tablet Oral BID   simvastatin   20 mg Oral QPM   sodium chloride  flush  10-40 mL Intracatheter Q12H   sodium chloride  flush  3 mL  Intravenous Q12H    have reviewed scheduled and prn medications.  Physical Exam:      General adult male in bed in no acute distress HEENT normocephalic atraumatic extraocular movements intact sclera anicteric Neck supple trachea midline Lungs clear to auscultation bilaterally normal work of breathing at rest  Heart S1S2 no rub Abdomen soft nontender obese habitus Extremities no pitting edema  Psych normal mood and affect Neuro - alert and oriented x 3 provides hx and follows commands  Dialysis access -left internal jugular tunneled catheter in place   Outpatient Dialysis Orders:  [facility]F - [facility] 3hr, 400/A1.5, EDW 125kg, 3K/2.5Ca bath, TDC, heparin  5000 unit bolus - Mircera 150mcg IV q 2 weeks (100mcg given 12/5) - Calcitriol  0.5mcg PO q HD    Kebrina Friend C Shandreka Dante 11/19/2024,9:46 AM  LOS: 8 days

## 2024-11-20 LAB — CBC
HCT: 25.8 % — ABNORMAL LOW (ref 39.0–52.0)
Hemoglobin: 8.9 g/dL — ABNORMAL LOW (ref 13.0–17.0)
MCH: 27.9 pg (ref 26.0–34.0)
MCHC: 34.5 g/dL (ref 30.0–36.0)
MCV: 80.9 fL (ref 80.0–100.0)
Platelets: 216 K/uL (ref 150–400)
RBC: 3.19 MIL/uL — ABNORMAL LOW (ref 4.22–5.81)
RDW: 16.5 % — ABNORMAL HIGH (ref 11.5–15.5)
WBC: 22.5 K/uL — ABNORMAL HIGH (ref 4.0–10.5)
nRBC: 0 % (ref 0.0–0.2)

## 2024-11-20 LAB — RENAL FUNCTION PANEL
Albumin: 2.3 g/dL — ABNORMAL LOW (ref 3.5–5.0)
Anion gap: 10 (ref 5–15)
BUN: 30 mg/dL — ABNORMAL HIGH (ref 8–23)
CO2: 27 mmol/L (ref 22–32)
Calcium: 8 mg/dL — ABNORMAL LOW (ref 8.9–10.3)
Chloride: 95 mmol/L — ABNORMAL LOW (ref 98–111)
Creatinine, Ser: 3.97 mg/dL — ABNORMAL HIGH (ref 0.61–1.24)
GFR, Estimated: 16 mL/min — ABNORMAL LOW
Glucose, Bld: 260 mg/dL — ABNORMAL HIGH (ref 70–99)
Phosphorus: 3.1 mg/dL (ref 2.5–4.6)
Potassium: 3.6 mmol/L (ref 3.5–5.1)
Sodium: 132 mmol/L — ABNORMAL LOW (ref 135–145)

## 2024-11-20 LAB — GLUCOSE, CAPILLARY
Glucose-Capillary: 259 mg/dL — ABNORMAL HIGH (ref 70–99)
Glucose-Capillary: 233 mg/dL — ABNORMAL HIGH (ref 70–99)
Glucose-Capillary: 219 mg/dL — ABNORMAL HIGH (ref 70–99)
Glucose-Capillary: 197 mg/dL — ABNORMAL HIGH (ref 70–99)

## 2024-11-20 MED ORDER — INSULIN ASPART 100 UNIT/ML IJ SOLN
5.0000 [IU] | Freq: Three times a day (TID) | INTRAMUSCULAR | Status: DC
  Administered 2024-11-20 – 2024-11-23 (×8): 5 [IU] via SUBCUTANEOUS
  Filled 2024-11-20 (×8): qty 5

## 2024-11-20 MED ORDER — CARVEDILOL 6.25 MG PO TABS
6.2500 mg | ORAL_TABLET | Freq: Two times a day (BID) | ORAL | Status: DC
  Administered 2024-11-20 – 2024-11-23 (×5): 6.25 mg via ORAL
  Filled 2024-11-20 (×6): qty 1

## 2024-11-20 MED ORDER — INSULIN GLARGINE 100 UNIT/ML ~~LOC~~ SOLN
20.0000 [IU] | Freq: Every day | SUBCUTANEOUS | Status: DC
  Administered 2024-11-21 – 2024-11-22 (×2): 20 [IU] via SUBCUTANEOUS
  Filled 2024-11-20 (×2): qty 0.2

## 2024-11-20 MED ORDER — CHLORHEXIDINE GLUCONATE CLOTH 2 % EX PADS
6.0000 | MEDICATED_PAD | Freq: Every day | CUTANEOUS | Status: DC
  Administered 2024-11-21 – 2024-11-23 (×3): 6 via TOPICAL

## 2024-11-20 NOTE — Plan of Care (Signed)
  Problem: Education: Goal: Ability to describe self-care measures that may prevent or decrease complications (Diabetes Survival Skills Education) will improve Outcome: Progressing Goal: Individualized Educational Video(s) Outcome: Progressing   Problem: Coping: Goal: Ability to adjust to condition or change in health will improve Outcome: Progressing   Problem: Fluid Volume: Goal: Ability to maintain a balanced intake and output will improve Outcome: Progressing   Problem: Health Behavior/Discharge Planning: Goal: Ability to identify and utilize available resources and services will improve Outcome: Progressing Goal: Ability to manage health-related needs will improve Outcome: Progressing   Problem: Metabolic: Goal: Ability to maintain appropriate glucose levels will improve Outcome: Progressing   Problem: Nutritional: Goal: Maintenance of adequate nutrition will improve Outcome: Progressing Goal: Progress toward achieving an optimal weight will improve Outcome: Progressing   Problem: Skin Integrity: Goal: Risk for impaired skin integrity will decrease Outcome: Progressing   Problem: Tissue Perfusion: Goal: Adequacy of tissue perfusion will improve Outcome: Progressing   Problem: Education: Goal: Knowledge of General Education information will improve Description: Including pain rating scale, medication(s)/side effects and non-pharmacologic comfort measures Outcome: Progressing   Problem: Health Behavior/Discharge Planning: Goal: Ability to manage health-related needs will improve Outcome: Progressing   Problem: Clinical Measurements: Goal: Ability to maintain clinical measurements within normal limits will improve Outcome: Progressing Goal: Will remain free from infection Outcome: Progressing Goal: Diagnostic test results will improve Outcome: Progressing Goal: Respiratory complications will improve Outcome: Progressing Goal: Cardiovascular complication will  be avoided Outcome: Progressing   Problem: Activity: Goal: Risk for activity intolerance will decrease Outcome: Progressing   Problem: Nutrition: Goal: Adequate nutrition will be maintained Outcome: Progressing   Problem: Coping: Goal: Level of anxiety will decrease Outcome: Progressing   Problem: Elimination: Goal: Will not experience complications related to bowel motility Outcome: Progressing Goal: Will not experience complications related to urinary retention Outcome: Progressing   Problem: Pain Managment: Goal: General experience of comfort will improve and/or be controlled Outcome: Progressing   Problem: Safety: Goal: Ability to remain free from injury will improve Outcome: Progressing   Problem: Skin Integrity: Goal: Risk for impaired skin integrity will decrease Outcome: Progressing   Problem: Education: Goal: Ability to describe self-care measures that may prevent or decrease complications (Diabetes Survival Skills Education) will improve Outcome: Progressing Goal: Individualized Educational Video(s) Outcome: Progressing   Problem: Cardiac: Goal: Ability to maintain an adequate cardiac output will improve Outcome: Progressing   Problem: Health Behavior/Discharge Planning: Goal: Ability to identify and utilize available resources and services will improve Outcome: Progressing Goal: Ability to manage health-related needs will improve Outcome: Progressing   Problem: Fluid Volume: Goal: Ability to achieve a balanced intake and output will improve Outcome: Progressing   Problem: Metabolic: Goal: Ability to maintain appropriate glucose levels will improve Outcome: Progressing   Problem: Nutritional: Goal: Maintenance of adequate nutrition will improve Outcome: Progressing Goal: Maintenance of adequate weight for body size and type will improve Outcome: Progressing   Problem: Respiratory: Goal: Will regain and/or maintain adequate  ventilation Outcome: Progressing   Problem: Urinary Elimination: Goal: Ability to achieve and maintain adequate renal perfusion and functioning will improve Outcome: Progressing

## 2024-11-20 NOTE — Inpatient Diabetes Management (Signed)
 Inpatient Diabetes Program Recommendations  AACE/ADA: New Consensus Statement on Inpatient Glycemic Control (2015)  Target Ranges:  Prepandial:   less than 140 mg/dL      Peak postprandial:   less than 180 mg/dL (1-2 hours)      Critically ill patients:  140 - 180 mg/dL   Lab Results  Component Value Date   GLUCAP 259 (H) 11/20/2024   HGBA1C 8.4 (H) 08/29/2024    Latest Reference Range & Units 11/19/24 08:25 11/19/24 12:03 11/19/24 18:26 11/19/24 19:51 11/20/24 07:46  Glucose-Capillary 70 - 99 mg/dL 750 (H) 650 (H) 813 (H) 283 (H) 259 (H)  (H): Data is abnormally high   Diabetes history: Type 2 DM Outpatient Diabetes medications: Lantus  20 units every day, Humalog  0-15 units TID, Mounjaro  15 mg qwk Current orders for Inpatient glycemic control:  Lantus  15 units every day Novolog  4 units TID Novolog  0-6 units TID  Inpatient Diabetes Program Recommendations:   Please consider: -Increase Lantus  to 20 units daily -Increase Novolog  meal coverage to 5 units tid if eats 50%  Thank you, Dagoberto E. Myiesha Edgar, RN, MSN, CNS, CDCES  Diabetes Coordinator Inpatient Glycemic Control Team Team Pager [phone] (8am-5pm) 11/20/2024 10:05 AM

## 2024-11-20 NOTE — Progress Notes (Signed)
 [facility] NEPHROLOGY PROGRESS NOTE   Assessment/ Plan: Pt is a 69 y.o. yo male with AKI on HD, HTN, T2DM, recent admit for head contusion s/p fall who is being admitted with fever, leukocytosis, and weakness due to sepsis.  He has progressed to ESRD  # Sepsis due to MSSA and Proteus bacteremia: Exact source unknown but concern for [facility].   -TDC removed by IR on 12/26 -abx per ID and primary team.  Currently on cefepime , s/p TEE on 12/31, ID is following. - note 11/13/24 cultures negative - Per ID, plans are for cefepime  three times a week after HD through 1/9 to complete 2 weeks of abx following line removal  # ESRD MTuThF  - Had been at [facility].  His tunneled catheter was removed on 12/26 for line holiday and then replaced on 12/30 - note that his AKI has progressed to ESRD ------- - HD per TTS schedule.  Next on 1/3 - ordered renal panel daily   # Anemia of CKD:   - Continue Aranesp  100 mcg every Sat and monitor hemoglobin  # Secondary hyperparathyroidism: phos controlled, not on binders.  on calcitriol .  # HTN/volume:  - optimize volume with HD  - continue fluid restriction of 1.5 liters/day   # Hyponatremia  - secondary to decreased free water excretion and fluid overload  - improved with HD   Disposition - continue inpatient monitoring     Subjective:  Last HD on 1/1 with 3 kg UF.  He had 200 mL UOP over 1/1 charted.  He feels ok today.  He hopes to work with therapy today as they have missed him recently due to procedures.  Review of systems:      I ordered sorbitol on 1/1 but this was never given.  No BM is documented but he states he did have one.     Denies shortness of breath or chest pain  Denies n/v    Objective Vital signs in last 24 hours: Vitals:   11/19/24 2258 11/20/24 0426 11/20/24 0500 11/20/24 0741  BP: (!) 124/57 128/66  138/65  Pulse: 74 72  65  Resp: 16 17  16   Temp: 98 F (36.7 C) 98.3 F (36.8 C)  98.7 F (37.1 C)   TempSrc:    Oral  SpO2: 91% 99%  100%  Weight:   121.7 kg   Height:       Weight change: 0.8 kg  Intake/Output Summary (Last 24 hours) at 11/20/2024 0945 Last data filed at 11/20/2024 0500 Gross per 24 hour  Intake 580 ml  Output 3200 ml  Net -2620 ml     Labs: RENAL PANEL Recent Labs  Lab 11/14/24 0449 11/15/24 0326 11/16/24 0355 11/17/24 0246 11/18/24 1018 11/19/24 1039 11/20/24 0203  NA 127* 127* 126* 126* 129* 130* 132*  K 4.1 4.4 4.3 4.3 4.0 4.2 3.6  CL 92* 91* 91* 92* 92* 94* 95*  CO2 24 25 23 23 23  21* 27  GLUCOSE 262* 298* 328* 265* 240* 272* 260*  BUN 44* 54* 59* 66* 40* 52* 30*  CREATININE 4.54* 5.30* 5.71* 6.18* 4.09* 5.33* 3.97*  CALCIUM 8.1* 8.1* 8.3* 8.2* 8.0* 8.1* 8.0*  PHOS 3.1 3.1  --   --   --  4.5 3.1  ALBUMIN  2.2* 2.3* 2.3* 2.3* 2.4* 2.3* 2.3*    Liver Function Tests: Recent Labs  Lab 11/16/24 0355 11/17/24 0246 11/18/24 1018 11/19/24 1039 11/20/24 0203  AST <10* 11* 11*  --   --  ALT 6 7 6   --   --   ALKPHOS 131* 122 124  --   --   BILITOT 0.6 0.5 0.6  --   --   PROT 6.1* 6.0* 6.3*  --   --   ALBUMIN  2.3* 2.3* 2.4* 2.3* 2.3*   No results for input(s): LIPASE, AMYLASE in the last 168 hours. No results for input(s): AMMONIA in the last 168 hours. CBC: Recent Labs    03/10/24 1213 08/28/24 1613 09/17/24 0322 09/18/24 1541 11/15/24 0326 11/16/24 0355 11/17/24 0246 11/18/24 1019 11/20/24 0203  HGB 12.2*   < > 8.8*   < > 9.5* 9.6* 9.1* 9.6* 8.9*  MCV 83.9   < > 82.4   < > 81.9 81.0 79.9* 78.7* 80.9  VITAMINB12  --   --  443  --   --   --   --   --   --   FOLATE  --   --  5.3*  --   --   --   --   --   --   FERRITIN 91  --  40  --   --   --   --   --   --   TIBC 218*  --  190*  --   --   --   --   --   --   IRON  40*  --  39*  --   --   --   --   --   --   RETICCTPCT  --   --  2.3  --   --   --   --   --   --    < > = values in this interval not displayed.    Cardiac Enzymes: No results for input(s): CKTOTAL, CKMB,  CKMBINDEX, TROPONINI in the last 168 hours. CBG: Recent Labs  Lab 11/19/24 0825 11/19/24 1203 11/19/24 1826 11/19/24 1951 11/20/24 0746  GLUCAP 249* 349* 186* 283* 259*    Iron  Studies: No results for input(s): IRON , TIBC, TRANSFERRIN, FERRITIN in the last 72 hours. Studies/Results: ECHO TEE Result Date: 11/18/2024    TRANSESOPHOGEAL ECHO REPORT   Patient Name:   Brendan [name]. Date of Exam: 11/18/2024 Medical Rec #:  [id number]       Height:       74.0 in Accession #:    [id number]      Weight:       272.9 lb Date of Birth:  Sep 18, 1956       BSA:          2.480 m Patient Age:    68 years        BP:           182/75 mmHg Patient Gender: M               HR:           73 bpm. Exam Location:  Inpatient Procedure: Transesophageal Echo, Color Doppler and Cardiac Doppler (Both            Spectral and Color Flow Doppler were utilized during procedure). Indications:    Bacteremia  History:        Patient has prior history of Echocardiogram examinations, most                 recent 11/12/2024.  Sonographer:    Tinnie Gosling RDCS Referring Phys: [phone] STEPHEN K CHIU PROCEDURE: After discussion of the risks and benefits of a TEE,  an informed consent was obtained from the patient. The transesophogeal probe was passed without difficulty through the esophogus of the patient. Sedation performed by different physician. The patient was monitored while under deep sedation. Anesthestetic sedation was provided intravenously by Anesthesiology: 148.56mg  of Propofol . The patient developed no complications during the procedure.  IMPRESSIONS  1. Left ventricular ejection fraction, by estimation, is 55 to 60%. The left ventricle has normal function.  2. Right ventricular systolic function is normal. The right ventricular size is normal.  3. No left atrial/left atrial appendage thrombus was detected.  4. A small pericardial effusion is present. The pericardial effusion is circumferential. Moderate pleural effusion  in the left lateral region.  5. The mitral valve is normal in structure. Trivial mitral valve regurgitation. No evidence of mitral stenosis.  6. The aortic valve is tricuspid. Aortic valve regurgitation is trivial. No aortic stenosis is present.  7. The inferior vena cava is normal in size with greater than 50% respiratory variability, suggesting right atrial pressure of 3 mmHg.  8. Agitated saline contrast bubble study was negative, with no evidence of any interatrial shunt. FINDINGS  Left Ventricle: Left ventricular ejection fraction, by estimation, is 55 to 60%. The left ventricle has normal function. The left ventricular internal cavity size was normal in size. Right Ventricle: The right ventricular size is normal. No increase in right ventricular wall thickness. Right ventricular systolic function is normal. Left Atrium: Left atrial size was normal in size. No left atrial/left atrial appendage thrombus was detected. Right Atrium: Right atrial size was normal in size. Pericardium: A small pericardial effusion is present. The pericardial effusion is circumferential. Mitral Valve: The mitral valve is normal in structure. Trivial mitral valve regurgitation. No evidence of mitral valve stenosis. Tricuspid Valve: The tricuspid valve is normal in structure. Tricuspid valve regurgitation is not demonstrated. No evidence of tricuspid stenosis. Aortic Valve: The aortic valve is tricuspid. Aortic valve regurgitation is trivial. No aortic stenosis is present. Pulmonic Valve: The pulmonic valve was grossly normal. Pulmonic valve regurgitation is not visualized. Aorta: The aortic root and ascending aorta are structurally normal, with no evidence of dilitation. Venous: The inferior vena cava is normal in size with greater than 50% respiratory variability, suggesting right atrial pressure of 3 mmHg. IAS/Shunts: There is right bowing of the interatrial septum, suggestive of elevated left atrial pressure. No atrial level shunt  detected by color flow Doppler. Agitated saline contrast was given intravenously to evaluate for intracardiac shunting. Agitated saline contrast bubble study was negative, with no evidence of any interatrial shunt. Additional Comments: A venous catheter is visualized. There is a moderate pleural effusion in the left lateral region. Stanly Leavens MD Electronically signed by Stanly Leavens MD Signature Date/Time: 11/18/2024/5:17:58 PM    Final    EP STUDY Result Date: 11/18/2024 See surgical note for result.    Medications: Infusions:   ceFAZolin  (ANCEF ) IV     ceFEPime  (MAXIPIME ) IV 2 g (11/19/24 1845)    Scheduled Medications:  calcitRIOL   0.25 mcg Oral Daily   Chlorhexidine  Gluconate Cloth  6 each Topical Daily   Chlorhexidine  Gluconate Cloth  6 each Topical Q0600   darbepoetin (ARANESP ) injection - DIALYSIS  100 mcg Subcutaneous Q Sat-1800   insulin  aspart  0-5 Units Subcutaneous QHS   insulin  aspart  0-6 Units Subcutaneous TID WC   insulin  aspart  4 Units Subcutaneous TID WC   insulin  glargine  15 Units Subcutaneous Daily   levothyroxine   125 mcg Oral q AM  polyethylene glycol  17 g Oral BID   senna-docusate  1 tablet Oral BID   simvastatin   20 mg Oral QPM   sodium chloride  flush  10-40 mL Intracatheter Q12H   sodium chloride  flush  3 mL Intravenous Q12H   sorbitol  30 mL Oral Once    have reviewed scheduled and prn medications.  Physical Exam:       General adult male in bed in no acute distress HEENT normocephalic atraumatic extraocular movements intact sclera anicteric Neck supple trachea midline Lungs clear to auscultation bilaterally normal work of breathing at rest  Heart S1S2 no rub Abdomen soft nontender obese habitus Extremities no pitting edema  Psych normal mood and affect Neuro - alert and oriented x 3 provides hx and follows commands  Dialysis access -left internal jugular tunneled catheter in place   Outpatient Dialysis Orders:  MTuThF -  [facility] 3hr, 400/A1.5, EDW 125kg, 3K/2.5Ca bath, TDC, heparin  5000 unit bolus - Mircera 150mcg IV q 2 weeks (100mcg given 12/5) - Calcitriol  0.5mcg PO q HD    Katheryn BROCKS Mikolaj Woolstenhulme 11/20/2024,10:22 AM  LOS: 9 days

## 2024-11-20 NOTE — Progress Notes (Signed)
 Physical Therapy Treatment Patient Details Name: Brendan [name]. MRN: [id number] DOB: 07/05/1956 Today's Date: 11/20/2024   History of Present Illness Pt is a 69 yo male who presented 11/11/24 with hypotension and generalized fatigue/weakness. Pt admitted with sepsis MSSA/prevotella source unclear but concern for [facility]. TDC removed 12/26. Of note, pt recently admitted 12/16-12/19 due to a fall with MRI showing large scalp contusion, trace SAH anterior frontal lobes, and small hemorrhagic interventricular contusions, Rt frontal meningioma. PMH - ESRD on HD, necrotizing fasciitis, DM2, HTN, hypothyroidism, OSA, obesity, CKD, CHF, glaucoma, gynecomastia, HLD, vertigo, obesity, peripheral neuropathy, prostate CA, sickle cell trait    PT Comments  Pt tolerates treatment well, ambulating for multiple trials and demonstrating improvement in transfer quality from elevated surfaces. Pt continues to lack LE power to rise from lower surfaces unassisted and remains at a high risk for falls due to poor endurance. Patient will benefit from intensive inpatient follow-up therapy, >3 hours/day.    If plan is discharge home, recommend the following: A lot of help with walking and/or transfers;A lot of help with bathing/dressing/bathroom;Assistance with cooking/housework;Direct supervision/assist for medications management;Direct supervision/assist for financial management;Assist for transportation;Help with stairs or ramp for entrance;Supervision due to cognitive status   Can travel by private vehicle        Equipment Recommendations  Hospital bed;Wheelchair (measurements PT);Wheelchair cushion (measurements PT)    Recommendations for Other Services       Precautions / Restrictions Precautions Precautions: Fall Recall of Precautions/Restrictions: Intact Precaution/Restrictions Comments: watch BP Restrictions Weight Bearing Restrictions Per Provider Order: No     Mobility  Bed Mobility Overal bed mobility:  Needs Assistance Bed Mobility: Supine to Sit, Sit to Supine     Supine to sit: Min assist Sit to supine: Min assist        Transfers Overall transfer level: Needs assistance Equipment used: Rolling walker (2 wheels) Transfers: Sit to/from Stand Sit to Stand: Contact guard assist, From elevated surface, Min assist           General transfer comment: CGA from elevated surface, minA from recliner due to posterior lean    Ambulation/Gait Ambulation/Gait assistance: Min assist Gait Distance (Feet): 20 Feet (additional trial of 15') Assistive device: Rolling walker (2 wheels) Gait Pattern/deviations: Step-to pattern Gait velocity: reduced Gait velocity interpretation: <1.8 ft/sec, indicate of risk for recurrent falls   General Gait Details: slowed step-to gait, reduced gait speed and increased time to change direction   Stairs             Wheelchair Mobility     Tilt Bed    Modified Rankin (Stroke Patients Only)       Balance Overall balance assessment: Needs assistance Sitting-balance support: No upper extremity supported, Feet supported Sitting balance-Leahy Scale: Good     Standing balance support: Bilateral upper extremity supported, Reliant on assistive device for balance Standing balance-Leahy Scale: Poor                              Communication Communication Communication: No apparent difficulties  Cognition Arousal: Alert Behavior During Therapy: Flat affect   PT - Cognitive impairments: Problem solving, Rancho level, Awareness, Sequencing                   Rancho Levels of Cognitive Functioning Rancho Los Amigos Scales of Cognitive Functioning: Purposeful, Appropriate: Stand-by Assistance Rancho Mirant Scales of Cognitive Functioning: Purposeful, Appropriate: Stand-by Assistance [VIII]   Following  commands: Impaired Following commands impaired: Follows one step commands with increased time, Follows multi-step  commands with increased time    Cueing Cueing Techniques: Verbal cues  Exercises General Exercises - Lower Extremity Ankle Circles/Pumps: AROM, Both, 10 reps Long Arc Quad: AROM, Both, 10 reps Hip Flexion/Marching: AROM, Both, 5 reps    General Comments General comments (skin integrity, edema, etc.): VSS on RA, pt does report dizziness with initial transition from supine to sitting which subsides with time upright      Pertinent Vitals/Pain Pain Assessment Pain Assessment: No/denies pain    Home Living                          Prior Function            PT Goals (current goals can now be found in the care plan section) Acute Rehab PT Goals Patient Stated Goal: to go to [facility] Progress towards PT goals: Progressing toward goals    Frequency    Min 3X/week      PT Plan      Co-evaluation              AM-PAC PT 6 Clicks Mobility   Outcome Measure  Help needed turning from your back to your side while in a flat bed without using bedrails?: A Little Help needed moving from lying on your back to sitting on the side of a flat bed without using bedrails?: A Little Help needed moving to and from a bed to a chair (including a wheelchair)?: A Little Help needed standing up from a chair using your arms (e.g., wheelchair or bedside chair)?: A Little Help needed to walk in hospital room?: A Lot Help needed climbing 3-5 steps with a railing? : Total 6 Click Score: 15    End of Session Equipment Utilized During Treatment: Gait belt Activity Tolerance: Patient tolerated treatment well Patient left: in bed;with call bell/phone within reach;with bed alarm set Nurse Communication: Mobility status       Time: 8685-8654 PT Time Calculation (min) (ACUTE ONLY): 31 min  Charges:    $Gait Training: 8-22 mins $Therapeutic Activity: 8-22 mins PT General Charges $$ ACUTE PT VISIT: 1 Visit                     Bernardino JINNY Ruth, PT, DPT Acute Rehabilitation Office  [phone]    Bernardino JINNY Ruth 11/20/2024, 2:03 PM

## 2024-11-20 NOTE — Progress Notes (Signed)
 Inpatient Rehab Admissions Coordinator:  ?Insurance authorization started. Will continue to follow. ? ? ?Tinnie Yvone Cohens, MS, CCC-SLP ?Admissions Coordinator ?[phone] ? ?

## 2024-11-20 NOTE — Progress Notes (Signed)
 " PROGRESS NOTE    Brendan [name].  FMW:[id number] DOB: 1956/05/18 DOA: 11/11/2024 PCP: Rexanne Ingle, MD  Subjective: Patient reports feeling okay, no acute complaints this am. Able to have a BM, denied any abdominal pain, cough, SOB  Hospital Course: 69 year old male with PMH of DM2, HTN, HLD, poor thyroidism, OSA on CPAP, Fournier's gangrene (October 2025), CKD stage V recently started on HD, recent admission for fall with TBI (12/16-12/18/25) who presented to the ED with fatigue. He was found to be febrile with leukocytosis and was managed for sepsis with unclear etiology. On 12/25, there was concern for DKA and he was managed in the progressive unit. 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 His blood cultures grew MSSA and proteus, for which ID was consulted. Potential sources included HD line and psoraitic rash on his palms. His HD line was removed on 12/26, and replaced on 12/30 after a line holiday. He underwent TEE on 12/31, which was negative for endocarditis.    Assessment and Plan:  Sepsis secondary to MSSA and proteus mirabilis bacteremia related to HD line HD cath removed by IR on 12/26 and replaced on 12/30 after a line holiday S/p TEE with no evidence of endocarditis Still with leukocytosis but no other focal symptoms Seen by ID, plan for cefepime  with HD till 1/9 to complete 2 weeks following line removal, nephrology is aware    DM with hyperglycemia Lantus  increased to 20U and increased aspart to 5U TID, continue SSI   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 Dianah (dermatology) for the past 6 years Has not been able to obtain Bimzelx yet holding clobetasol  at this time   AKI now advanced to ESRD Next HD planned for 1/3   HFpEF NYHA IV Will not recommend SGLT2 in the future given groin wounds Continue to hold amlodipine , and torsemide  for now Resume coreg  at a lower dose    Hypothyroidism Continue Synthroid  125   Recent TBI brain managed nonsurgically during hospitalization 12/16-12/9. 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 Victory Boers 12/26 neurosurgery --absence of mental status changes or focal deficit --nothing surgically specific to do in this case   Scrotal wound in the setting of Fournier's gangrene as above Last seen [facility] surgery 10/13/2024 WOC following   BMI 36 Recommend diet/lifestyle modification   Hyponatremia - improved with HD, continue fluid restriction    Constipation - continue bowel regimen with miralax , senokot, PRN lactulose     DVT prophylaxis: Place and maintain sequential compression device Start: 11/11/24 1752    Code Status: Full Code Disposition Plan: CIR Reason for continuing need for hospitalization: bed availability  Objective: Vitals:   11/20/24 0426 11/20/24 0500 11/20/24 0741 11/20/24 1210  BP: 128/66  138/65 (!) 140/75  Pulse: 72  65 67  Resp: 17  16 17   Temp: 98.3 F (36.8 C)  98.7 F (37.1 C) 98.3 F (36.8 C)  TempSrc:   Oral   SpO2: 99%  100% 99%  Weight:  121.7 kg    Height:        Intake/Output Summary (Last 24 hours) at 11/20/2024 1514 Last data filed at 11/20/2024 0500 Gross per 24 hour  Intake 340 ml  Output 3200 ml  Net -2860 ml   Filed Weights   11/19/24 1318 11/19/24 1719 11/20/24 0500  Weight: 124.6 kg 121.4  kg 121.7 kg    Examination:  Physical Exam Vitals and nursing note reviewed.  Constitutional:      General: He is not in acute distress. Cardiovascular:     Rate and Rhythm: Normal rate.  Pulmonary:     Effort: No respiratory distress.     Breath sounds: No wheezing.  Abdominal:     General: There is no distension.     Tenderness: There is no abdominal tenderness.  Musculoskeletal:     Right lower leg: No edema.     Left lower leg: No edema.      Data Reviewed: I have personally reviewed following labs and imaging studies  CBC: Recent Labs  Lab 11/14/24 0449 11/15/24 0326 11/16/24 0355 11/17/24 0246 11/18/24 1019 11/20/24 0203  WBC 23.7* 22.4* 19.5* 17.6* 18.0* 22.5*  NEUTROABS 20.1* 18.3*  --   --  12.9*  --   HGB 9.3* 9.5* 9.6* 9.1* 9.6* 8.9*  HCT 27.1* 27.2* 27.7* 25.9* 26.9* 25.8*  MCV 81.4 81.9 81.0 79.9* 78.7* 80.9  PLT 77* PLATELET CLUMPS NOTED ON SMEAR, UNABLE TO ESTIMATE 67* 93* 125* 216   Basic Metabolic Panel: Recent Labs  Lab 11/14/24 0449 11/15/24 0326 11/16/24 0355 11/17/24 0246 11/18/24 1018 11/19/24 1039 11/20/24 0203  NA 127* 127* 126* 126* 129* 130* 132*  K 4.1 4.4 4.3 4.3 4.0 4.2 3.6  CL 92* 91* 91* 92* 92* 94* 95*  CO2 24 25 23 23 23  21* 27  GLUCOSE 262* 298* 328* 265* 240* 272* 260*  BUN 44* 54* 59* 66* 40* 52* 30*  CREATININE 4.54* 5.30* 5.71* 6.18* 4.09* 5.33* 3.97*  CALCIUM 8.1* 8.1* 8.3* 8.2* 8.0* 8.1* 8.0*  PHOS 3.1 3.1  --   --   --  4.5 3.1   GFR: Estimated Creatinine Clearance: 24.3 mL/min (A) (by C-G formula based on SCr of 3.97 mg/dL (H)). Liver Function Tests: Recent Labs  Lab 11/16/24 0355 11/17/24 0246 11/18/24 1018 11/19/24 1039 11/20/24 0203  AST <10* 11* 11*  --   --   ALT 6 7 6   --   --   ALKPHOS 131* 122 124  --   --   BILITOT 0.6 0.5 0.6  --   --   PROT 6.1* 6.0* 6.3*  --   --   ALBUMIN  2.3* 2.3* 2.4* 2.3* 2.3*   No results for input(s): LIPASE, AMYLASE in the last 168 hours. No results for input(s): AMMONIA in the last 168 hours. Coagulation Profile: No results for input(s): INR, PROTIME in the last 168 hours. Cardiac Enzymes: No results for input(s): CKTOTAL, CKMB, CKMBINDEX, TROPONINI in the last 168 hours. ProBNP, BNP (last 5 results) Recent Labs    01/20/24 1042 08/28/24 1613 09/15/24 1846  PROBNP  --  6,038.0*  --   BNP 295.6*  --  728.9*   HbA1C: No results for input(s): HGBA1C in the last 72 hours. CBG: Recent  Labs  Lab 11/19/24 1203 11/19/24 1826 11/19/24 1951 11/20/24 0746 11/20/24 1211  GLUCAP 349* 186* 283* 259* 233*   Lipid Profile: No results for input(s): CHOL, HDL, LDLCALC, TRIG, CHOLHDL, LDLDIRECT in the last 72 hours. Thyroid  Function Tests: No results for input(s): TSH, T4TOTAL, FREET4, T3FREE, THYROIDAB in the last 72 hours. Anemia Panel: No results for input(s): VITAMINB12, FOLATE, FERRITIN, TIBC, IRON , RETICCTPCT in the last 72 hours. Sepsis Labs: No results for input(s): PROCALCITON, LATICACIDVEN in the last 168 hours.  Recent Results (from the past 240 hours)  Resp panel by RT-PCR (RSV,  Flu A&B, Covid) Anterior Nasal Swab     Status: None   Collection Time: 11/11/24 11:25 AM   Specimen: Anterior Nasal Swab  Result Value Ref Range Status   SARS Coronavirus 2 by RT PCR NEGATIVE NEGATIVE Final   Influenza A by PCR NEGATIVE NEGATIVE Final   Influenza B by PCR NEGATIVE NEGATIVE Final    Comment: (NOTE) The Xpert Xpress SARS-CoV-2/FLU/RSV plus assay is intended as an aid in the diagnosis of influenza from Nasopharyngeal swab specimens and should not be used as a sole basis for treatment. Nasal washings and aspirates are unacceptable for Xpert Xpress SARS-CoV-2/FLU/RSV testing.  Fact Sheet for Patients: bloggercourse.com  Fact Sheet for Healthcare Providers: seriousbroker.it  This test is not yet approved or cleared by the [place]  FDA and has been authorized for detection and/or diagnosis of SARS-CoV-2 by FDA under an Emergency Use Authorization (EUA). This EUA will remain in effect (meaning this test can be used) for the duration of the COVID-19 declaration under Section 564(b)(1) of the Act, 21 U.S.C. section 360bbb-3(b)(1), unless the authorization is terminated or revoked.     Resp Syncytial Virus by PCR NEGATIVE NEGATIVE Final    Comment: (NOTE) Fact Sheet for  Patients: bloggercourse.com  Fact Sheet for Healthcare Providers: seriousbroker.it  This test is not yet approved or cleared by the [place]  FDA and has been authorized for detection and/or diagnosis of SARS-CoV-2 by FDA under an Emergency Use Authorization (EUA). This EUA will remain in effect (meaning this test can be used) for the duration of the COVID-19 declaration under Section 564(b)(1) of the Act, 21 U.S.C. section 360bbb-3(b)(1), unless the authorization is terminated or revoked.  Performed at [facility], [address] [place], [place] [address]   Blood culture (routine x 2)     Status: Abnormal   Collection Time: 11/11/24  1:29 PM   Specimen: BLOOD  Result Value Ref Range Status   Specimen Description BLOOD SITE NOT SPECIFIED  Final   Special Requests   Final    BOTTLES DRAWN AEROBIC AND ANAEROBIC Blood Culture results may not be optimal due to an inadequate volume of blood received in culture bottles   Culture  Setup Time   Final    GRAM POSITIVE COCCI IN CLUSTERS GRAM NEGATIVE RODS IN BOTH AEROBIC AND ANAEROBIC BOTTLES CRITICAL RESULT CALLED TO, READ BACK BY AND VERIFIED WITH: PHARMD G ABBOTT 11/12/2024 @ 0347 BY AB Performed at [facility], [address] [place], [place] [address]    Culture PROTEUS MIRABILIS STAPHYLOCOCCUS AUREUS  (A)  Final   Report Status 11/15/2024 FINAL  Final   Organism ID, Bacteria PROTEUS MIRABILIS  Final   Organism ID, Bacteria STAPHYLOCOCCUS AUREUS  Final      Susceptibility   Proteus mirabilis - MIC*    AMPICILLIN  <=2 SENSITIVE Sensitive     CEFAZOLIN  (NON-URINE) 4 INTERMEDIATE Intermediate     CEFEPIME  <=0.12 SENSITIVE Sensitive     ERTAPENEM <=0.12 SENSITIVE Sensitive     CEFTRIAXONE  <=0.25 SENSITIVE Sensitive     CIPROFLOXACIN <=0.06 SENSITIVE Sensitive     GENTAMICIN <=1 SENSITIVE Sensitive     MEROPENEM 1 SENSITIVE Sensitive     TRIMETH/SULFA <=20  SENSITIVE Sensitive     AMPICILLIN /SULBACTAM <=2 SENSITIVE Sensitive     PIP/TAZO Value in next row Sensitive      <=4 SENSITIVEThis is a modified FDA-approved test that has been validated and its performance characteristics determined by the reporting laboratory.  This laboratory  is certified under the Clinical Laboratory Improvement Amendments CLIA as qualified to perform high complexity clinical laboratory testing.    * PROTEUS MIRABILIS   Staphylococcus aureus - MIC*    CIPROFLOXACIN Value in next row Sensitive      <=4 SENSITIVEThis is a modified FDA-approved test that has been validated and its performance characteristics determined by the reporting laboratory.  This laboratory is certified under the Clinical Laboratory Improvement Amendments CLIA as qualified to perform high complexity clinical laboratory testing.    ERYTHROMYCIN Value in next row Resistant      <=4 SENSITIVEThis is a modified FDA-approved test that has been validated and its performance characteristics determined by the reporting laboratory.  This laboratory is certified under the Clinical Laboratory Improvement Amendments CLIA as qualified to perform high complexity clinical laboratory testing.    GENTAMICIN Value in next row Sensitive      <=4 SENSITIVEThis is a modified FDA-approved test that has been validated and its performance characteristics determined by the reporting laboratory.  This laboratory is certified under the Clinical Laboratory Improvement Amendments CLIA as qualified to perform high complexity clinical laboratory testing.    OXACILLIN Value in next row Sensitive      <=4 SENSITIVEThis is a modified FDA-approved test that has been validated and its performance characteristics determined by the reporting laboratory.  This laboratory is certified under the Clinical Laboratory Improvement Amendments CLIA as qualified to perform high complexity clinical laboratory testing.    TETRACYCLINE Value in next row  Resistant      <=4 SENSITIVEThis is a modified FDA-approved test that has been validated and its performance characteristics determined by the reporting laboratory.  This laboratory is certified under the Clinical Laboratory Improvement Amendments CLIA as qualified to perform high complexity clinical laboratory testing.    VANCOMYCIN  Value in next row Sensitive      <=4 SENSITIVEThis is a modified FDA-approved test that has been validated and its performance characteristics determined by the reporting laboratory.  This laboratory is certified under the Clinical Laboratory Improvement Amendments CLIA as qualified to perform high complexity clinical laboratory testing.    TRIMETH/SULFA Value in next row Sensitive      <=4 SENSITIVEThis is a modified FDA-approved test that has been validated and its performance characteristics determined by the reporting laboratory.  This laboratory is certified under the Clinical Laboratory Improvement Amendments CLIA as qualified to perform high complexity clinical laboratory testing.    CLINDAMYCIN Value in next row Resistant      <=4 SENSITIVEThis is a modified FDA-approved test that has been validated and its performance characteristics determined by the reporting laboratory.  This laboratory is certified under the Clinical Laboratory Improvement Amendments CLIA as qualified to perform high complexity clinical laboratory testing.    RIFAMPIN Value in next row Sensitive      <=4 SENSITIVEThis is a modified FDA-approved test that has been validated and its performance characteristics determined by the reporting laboratory.  This laboratory is certified under the Clinical Laboratory Improvement Amendments CLIA as qualified to perform high complexity clinical laboratory testing.    Inducible Clindamycin Value in next row Sensitive      <=4 SENSITIVEThis is a modified FDA-approved test that has been validated and its performance characteristics determined by the reporting  laboratory.  This laboratory is certified under the Clinical Laboratory Improvement Amendments CLIA as qualified to perform high complexity clinical laboratory testing.    LINEZOLID  Value in next row Sensitive      <=4 SENSITIVEThis is  a modified FDA-approved test that has been validated and its performance characteristics determined by the reporting laboratory.  This laboratory is certified under the Clinical Laboratory Improvement Amendments CLIA as qualified to perform high complexity clinical laboratory testing.    * STAPHYLOCOCCUS AUREUS  Blood Culture ID Panel (Reflexed)     Status: Abnormal   Collection Time: 11/11/24  1:29 PM  Result Value Ref Range Status   Enterococcus faecalis NOT DETECTED NOT DETECTED Corrected   Enterococcus Faecium NOT DETECTED NOT DETECTED Corrected   Listeria monocytogenes NOT DETECTED NOT DETECTED Corrected   Staphylococcus species DETECTED (A) NOT DETECTED Corrected    Comment: CRITICAL RESULT CALLED TO, READ BACK BY AND VERIFIED WITH: PHARMD G ABBOTT 11/12/2024 @ 0347 BY AB    Staphylococcus aureus (BCID) DETECTED (A) NOT DETECTED Corrected    Comment: CRITICAL RESULT CALLED TO, READ BACK BY AND VERIFIED WITH: PHARMD G ABBOTT 11/12/2024 @ 0347 BY AB    Staphylococcus epidermidis NOT DETECTED NOT DETECTED Corrected   Staphylococcus lugdunensis NOT DETECTED NOT DETECTED Corrected   Streptococcus species NOT DETECTED NOT DETECTED Corrected   Streptococcus agalactiae NOT DETECTED NOT DETECTED Corrected   Streptococcus pneumoniae NOT DETECTED NOT DETECTED Corrected   Streptococcus pyogenes NOT DETECTED NOT DETECTED Corrected   A.calcoaceticus-baumannii NOT DETECTED NOT DETECTED Corrected   Bacteroides fragilis NOT DETECTED NOT DETECTED Corrected   Enterobacterales DETECTED (A) NOT DETECTED Corrected    Comment: Enterobacterales represent a large order of gram negative bacteria, not a single organism. CRITICAL RESULT CALLED TO, READ BACK BY AND VERIFIED  WITH: PHARMD G ABBOTT 11/12/2024 @ 0347 BY AB    Enterobacter cloacae complex NOT DETECTED NOT DETECTED Corrected   Escherichia coli NOT DETECTED NOT DETECTED Corrected   Klebsiella aerogenes NOT DETECTED NOT DETECTED Corrected   Klebsiella oxytoca NOT DETECTED NOT DETECTED Corrected   Klebsiella pneumoniae NOT DETECTED NOT DETECTED Corrected   Proteus species DETECTED (A) NOT DETECTED Corrected    Comment: CRITICAL RESULT CALLED TO, READ BACK BY AND VERIFIED WITH: PHARMD G ABBOTT 11/12/2024 @ 0347 BY AB    Salmonella species NOT DETECTED NOT DETECTED Corrected   Serratia marcescens NOT DETECTED NOT DETECTED Corrected   Haemophilus influenzae NOT DETECTED NOT DETECTED Corrected   Neisseria meningitidis NOT DETECTED NOT DETECTED Corrected   Pseudomonas aeruginosa NOT DETECTED NOT DETECTED Corrected   Stenotrophomonas maltophilia NOT DETECTED NOT DETECTED Corrected   Candida albicans NOT DETECTED NOT DETECTED Corrected   Candida auris NOT DETECTED NOT DETECTED Corrected   Candida glabrata NOT DETECTED NOT DETECTED Corrected   Candida krusei NOT DETECTED NOT DETECTED Corrected   Candida parapsilosis NOT DETECTED NOT DETECTED Corrected   Candida tropicalis NOT DETECTED NOT DETECTED Corrected   Cryptococcus neoformans/gattii NOT DETECTED NOT DETECTED Corrected   CTX-M ESBL NOT DETECTED NOT DETECTED Corrected   Carbapenem resistance IMP NOT DETECTED NOT DETECTED Corrected   Carbapenem resistance KPC NOT DETECTED NOT DETECTED Corrected   Meth resistant mecA/C and MREJ NOT DETECTED NOT DETECTED Corrected   Carbapenem resistance NDM NOT DETECTED NOT DETECTED Corrected   Carbapenem resist OXA 48 LIKE NOT DETECTED NOT DETECTED Corrected   Carbapenem resistance VIM NOT DETECTED NOT DETECTED Corrected    Comment: Performed at [facility], [address] [place], [place] [address]  Blood culture (routine x 2)     Status: None   Collection Time: 11/11/24  8:57 PM   Specimen: BLOOD LEFT  HAND  Result Value Ref Range Status   Specimen Description  BLOOD LEFT HAND  Final   Special Requests   Final    BOTTLES DRAWN AEROBIC AND ANAEROBIC Blood Culture adequate volume   Culture   Final    NO GROWTH 5 DAYS Performed at [facility], [address] [place], [place] [address]    Report Status 11/16/2024 FINAL  Final  MRSA Next Gen by PCR, Nasal     Status: None   Collection Time: 11/11/24  9:48 PM   Specimen: Nasal Mucosa; Nasal Swab  Result Value Ref Range Status   MRSA by PCR Next Gen NOT DETECTED NOT DETECTED Final    Comment: (NOTE) The GeneXpert MRSA Assay (FDA approved for NASAL specimens only), is one component of a comprehensive MRSA colonization surveillance program. It is not intended to diagnose MRSA infection nor to guide or monitor treatment for MRSA infections. Test performance is not FDA approved in patients less than 24 years old. Performed at [facility], [address], [place], [place] [address]   Respiratory (~20 pathogens) panel by PCR     Status: None   Collection Time: 11/12/24 10:28 AM   Specimen: Nasopharyngeal Swab; Respiratory  Result Value Ref Range Status   Adenovirus NOT DETECTED NOT DETECTED Final   Coronavirus 229E NOT DETECTED NOT DETECTED Final    Comment: (NOTE) The Coronavirus on the Respiratory Panel, DOES NOT test for the novel  Coronavirus (2019 nCoV)    Coronavirus HKU1 NOT DETECTED NOT DETECTED Final   Coronavirus NL63 NOT DETECTED NOT DETECTED Final   Coronavirus OC43 NOT DETECTED NOT DETECTED Final   Metapneumovirus NOT DETECTED NOT DETECTED Final   Rhinovirus / Enterovirus NOT DETECTED NOT DETECTED Final   Influenza A NOT DETECTED NOT DETECTED Final   Influenza B NOT DETECTED NOT DETECTED Final   Parainfluenza Virus 1 NOT DETECTED NOT DETECTED Final   Parainfluenza Virus 2 NOT DETECTED NOT DETECTED Final   Parainfluenza Virus 3 NOT DETECTED NOT DETECTED Final   Parainfluenza Virus 4 NOT DETECTED NOT DETECTED  Final   Respiratory Syncytial Virus NOT DETECTED NOT DETECTED Final   Bordetella pertussis NOT DETECTED NOT DETECTED Final   Bordetella Parapertussis NOT DETECTED NOT DETECTED Final   Chlamydophila pneumoniae NOT DETECTED NOT DETECTED Final   Mycoplasma pneumoniae NOT DETECTED NOT DETECTED Final    Comment: Performed at [facility] [place] [facility], [address], [place], [place] [address]  Culture, blood (Routine X 2) w Reflex to ID Panel     Status: None   Collection Time: 11/13/24  7:29 AM   Specimen: BLOOD RIGHT HAND  Result Value Ref Range Status   Specimen Description BLOOD RIGHT HAND  Final   Special Requests   Final    BOTTLES DRAWN AEROBIC AND ANAEROBIC Blood Culture results may not be optimal due to an inadequate volume of blood received in culture bottles   Culture   Final    NO GROWTH 5 DAYS Performed at [facility], [address], [place], [place] [address]    Report Status 11/18/2024 FINAL  Final  Culture, blood (Routine X 2) w Reflex to ID Panel     Status: None   Collection Time: 11/13/24  7:29 AM   Specimen: BLOOD LEFT ARM  Result Value Ref Range Status   Specimen Description BLOOD LEFT ARM  Final   Special Requests   Final    BOTTLES DRAWN AEROBIC AND ANAEROBIC Blood Culture results may not be optimal due to an inadequate volume of  blood received in culture bottles   Culture   Final    NO GROWTH 5 DAYS Performed at [facility], [address] [place], [place] [address]    Report Status 11/18/2024 FINAL  Final     Radiology Studies: No results found.  Scheduled Meds:  calcitRIOL   0.25 mcg Oral Daily   Chlorhexidine  Gluconate Cloth  6 each Topical Daily   darbepoetin (ARANESP ) injection - DIALYSIS  100 mcg Subcutaneous Q Sat-1800   insulin  aspart  0-5 Units Subcutaneous QHS   insulin  aspart  0-6 Units Subcutaneous TID WC   insulin  aspart  5 Units Subcutaneous TID WC   [START ON 11/21/2024] insulin  glargine  20 Units Subcutaneous Daily   levothyroxine    125 mcg Oral q AM   polyethylene glycol  17 g Oral BID   senna-docusate  1 tablet Oral BID   simvastatin   20 mg Oral QPM   sodium chloride  flush  10-40 mL Intracatheter Q12H   sodium chloride  flush  3 mL Intravenous Q12H   Continuous Infusions:  ceFEPime  (MAXIPIME ) IV 2 g (11/19/24 1845)     LOS: 9 days   Time spent: 38 minutes  Casimer Dare, MD  [facility]  11/20/2024, 3:14 PM   "

## 2024-11-20 NOTE — Progress Notes (Signed)
 Monitoring whether pt will be d/c to home or SNF,  -if he returns home, changes in his appointment time will need to be made per home HD nurse -if SNF, he will not be TCU eligible anymore and will need a 3x week clinic schedule.   Will continue to monitor Lavanda Fredrickson Dialysis Navigator [phone]

## 2024-11-20 NOTE — Progress Notes (Signed)
 Occupational Therapy Treatment Patient Details Name: Brendan [name]. MRN: [id number] DOB: [birth year] Today's Date: 11/20/2024   History of present illness Pt is a 69 yo male who presented 11/11/24 with hypotension and generalized fatigue/weakness. Pt admitted with sepsis MSSA/prevotella source unclear but concern for [employer] TDC removed 12/26. Of note, pt recently admitted 12/16-12/19 due to a fall with MRI showing large scalp contusion, trace SAH anterior frontal lobes, and small hemorrhagic interventricular contusions, Rt frontal meningioma. PMH - ESRD on HD, necrotizing fasciitis, DM2, HTN, hypothyroidism, OSA, obesity, CKD, CHF, glaucoma, gynecomastia, HLD, vertigo, obesity, peripheral neuropathy, prostate CA, sickle cell trait   OT comments  Pt c/o fatigue, but eager to participate and improve strength. Pt able to get to EOB with supervision, increased time with HOB elevated using bed rails. Pt demos fair BUE strength/ROM, able to complete ADLs in sitting position on EOB well. Pt attempted 5X STS, was only able to stand up 3X in 55 seconds, c/o fatigue/dizziness. BP and O2 saturation WNLs, did not change from sitting to standing. Pt requested to eat once food arrived while sitting EOB, returned 15 minutes later and Pt still eating sitting EOB, tolerating well. Will continue to see acutely to progress with functional strength and independence with ADLs as able, postacute intensive rehab >3hrs/day still recommended.       If plan is discharge home, recommend the following:  A lot of help with walking and/or transfers;A lot of help with bathing/dressing/bathroom;Assistance with cooking/housework;Assist for transportation   Equipment Recommendations  Other (comment) (defer)    Recommendations for Other Services      Precautions / Restrictions Precautions Precautions: Fall Recall of Precautions/Restrictions: Intact Precaution/Restrictions Comments: watch BP Restrictions Weight Bearing  Restrictions Per Provider Order: No       Mobility Bed Mobility Overal bed mobility: Needs Assistance Bed Mobility: Supine to Sit     Supine to sit: Supervision     General bed mobility comments: CGA, HOB elevated, increased time due to fatigue/weakness    Transfers Overall transfer level: Needs assistance Equipment used: Rolling walker (2 wheels) Transfers: Sit to/from Stand Sit to Stand: Contact guard assist, From elevated surface           General transfer comment: CGA from elevated surface     Balance Overall balance assessment: Needs assistance Sitting-balance support: No upper extremity supported, Feet supported Sitting balance-Leahy Scale: Good                                     ADL either performed or assessed with clinical judgement   ADL Overall ADL's : Needs assistance/impaired Eating/Feeding: Set up;Sitting   Grooming: Set up;Sitting                                 General ADL Comments: Limited by decreased activity tolerance and strength    Extremity/Trunk Assessment Upper Extremity Assessment Upper Extremity Assessment: Overall WFL for tasks assessed            Vision       Perception     Praxis     Communication Communication Communication: No apparent difficulties   Cognition Arousal: Alert Behavior During Therapy: Flat affect Cognition: Cognition impaired             OT - Cognition Comments: grossly WFLs, fully participated during therapy  Following commands: Intact        Cueing   Cueing Techniques: Verbal cues  Exercises Exercises: Other exercises Other Exercises Other Exercises: sit to stands X3 in 55 seconds, increased time/effort due to fatigue/weakness.    Shoulder Instructions       General Comments VSS on RA, pt does report dizziness with initial transition from supine to sitting which subsides with time upright    Pertinent Vitals/ Pain       Pain  Assessment Pain Assessment: No/denies pain  Home Living                                          Prior Functioning/Environment              Frequency  Min 2X/week        Progress Toward Goals  OT Goals(current goals can now be found in the care plan section)  Progress towards OT goals: Progressing toward goals  Acute Rehab OT Goals Patient Stated Goal: to improve BLE strength OT Goal Formulation: With patient Time For Goal Achievement: 11/27/24 Potential to Achieve Goals: Good ADL Goals Pt Will Perform Grooming: with supervision;standing Pt Will Perform Lower Body Bathing: with min assist;sitting/lateral leans;sit to/from stand Pt Will Perform Lower Body Dressing: with supervision;sit to/from stand Pt Will Transfer to Toilet: with supervision;ambulating;regular height toilet Pt Will Perform Toileting - Clothing Manipulation and hygiene: with supervision;sit to/from stand Additional ADL Goal #1: Pt to complete further functional visual assessment Additional ADL Goal #2: Pt to verbalize at least 3 fall prevention precautions to implement if dizziness persists during ADLs/mobility  Plan      Co-evaluation                 AM-PAC OT 6 Clicks Daily Activity     Outcome Measure   Help from another person eating meals?: None Help from another person taking care of personal grooming?: A Little Help from another person toileting, which includes using toliet, bedpan, or urinal?: Total Help from another person bathing (including washing, rinsing, drying)?: A Lot Help from another person to put on and taking off regular upper body clothing?: A Little Help from another person to put on and taking off regular lower body clothing?: A Lot 6 Click Score: 15    End of Session Equipment Utilized During Treatment: Gait belt;Rolling walker (2 wheels)  OT Visit Diagnosis: Other abnormalities of gait and mobility (R26.89);Unsteadiness on feet (R26.81);Muscle  weakness (generalized) (M62.81);History of falling (Z91.81)   Activity Tolerance Patient limited by fatigue   Patient Left in bed;with call bell/phone within reach;with bed alarm set;Other (comment) (sitting EOB with food tray)   Nurse Communication Mobility status        Time: 8567-8551 OT Time Calculation (min): 16 min  Charges: OT General Charges $OT Visit: 1 Visit OT Treatments $Therapeutic Activity: 8-22 mins  [address], OTR/L   Elouise JONELLE Bott 11/20/2024, 3:08 PM

## 2024-11-21 DIAGNOSIS — A419 Sepsis, unspecified organism: Secondary | ICD-10-CM | POA: Diagnosis not present

## 2024-11-21 LAB — RENAL FUNCTION PANEL
Albumin: 2.3 g/dL — ABNORMAL LOW (ref 3.5–5.0)
Anion gap: 10 (ref 5–15)
BUN: 36 mg/dL — ABNORMAL HIGH (ref 8–23)
CO2: 27 mmol/L (ref 22–32)
Calcium: 8.2 mg/dL — ABNORMAL LOW (ref 8.9–10.3)
Chloride: 96 mmol/L — ABNORMAL LOW (ref 98–111)
Creatinine, Ser: 5.42 mg/dL — ABNORMAL HIGH (ref 0.61–1.24)
GFR, Estimated: 11 mL/min — ABNORMAL LOW
Glucose, Bld: 169 mg/dL — ABNORMAL HIGH (ref 70–99)
Phosphorus: 3.8 mg/dL (ref 2.5–4.6)
Potassium: 3.8 mmol/L (ref 3.5–5.1)
Sodium: 133 mmol/L — ABNORMAL LOW (ref 135–145)

## 2024-11-21 LAB — CBC
HCT: 26.4 % — ABNORMAL LOW (ref 39.0–52.0)
Hemoglobin: 8.9 g/dL — ABNORMAL LOW (ref 13.0–17.0)
MCH: 28.3 pg (ref 26.0–34.0)
MCHC: 33.7 g/dL (ref 30.0–36.0)
MCV: 83.8 fL (ref 80.0–100.0)
Platelets: 273 K/uL (ref 150–400)
RBC: 3.15 MIL/uL — ABNORMAL LOW (ref 4.22–5.81)
RDW: 17.2 % — ABNORMAL HIGH (ref 11.5–15.5)
WBC: 32.8 K/uL — ABNORMAL HIGH (ref 4.0–10.5)
nRBC: 0 % (ref 0.0–0.2)

## 2024-11-21 LAB — GLUCOSE, CAPILLARY
Glucose-Capillary: 201 mg/dL — ABNORMAL HIGH (ref 70–99)
Glucose-Capillary: 118 mg/dL — ABNORMAL HIGH (ref 70–99)
Glucose-Capillary: 166 mg/dL — ABNORMAL HIGH (ref 70–99)

## 2024-11-21 MED ORDER — LIDOCAINE-PRILOCAINE 2.5-2.5 % EX CREA: 1.0000 | TOPICAL_CREAM | CUTANEOUS | Status: DC | PRN

## 2024-11-21 MED ORDER — HEPARIN SODIUM (PORCINE) 1000 UNIT/ML DIALYSIS
1000.0000 [IU] | INTRAMUSCULAR | Status: DC | PRN
  Administered 2024-11-21: 4600 [IU]

## 2024-11-21 MED ORDER — CEFAZOLIN SODIUM-DEXTROSE 2-4 GM/100ML-% IV SOLN: 2.0000 g | INTRAVENOUS | Status: DC

## 2024-11-21 MED ORDER — LIDOCAINE HCL (PF) 1 % IJ SOLN: 5.0000 mL | INTRAMUSCULAR | Status: DC | PRN

## 2024-11-21 MED ORDER — HEPARIN SODIUM (PORCINE) 1000 UNIT/ML IJ SOLN
INTRAMUSCULAR | Status: AC
  Filled 2024-11-21: qty 5

## 2024-11-21 MED ORDER — ANTICOAGULANT SODIUM CITRATE 4% (200MG/5ML) IV SOLN: 5.0000 mL | Status: DC | PRN

## 2024-11-21 MED ORDER — LEVOFLOXACIN 500 MG PO TABS
500.0000 mg | ORAL_TABLET | ORAL | Status: DC
  Administered 2024-11-22: 500 mg via ORAL
  Filled 2024-11-21: qty 1

## 2024-11-21 MED ORDER — ALTEPLASE 2 MG IJ SOLR: 2.0000 mg | Freq: Once | INTRAMUSCULAR | Status: DC | PRN

## 2024-11-21 MED ORDER — PENTAFLUOROPROP-TETRAFLUOROETH EX AERO: 1.0000 | INHALATION_SPRAY | CUTANEOUS | Status: DC | PRN

## 2024-11-21 MED ORDER — DARBEPOETIN ALFA 100 MCG/0.5ML IJ SOSY: 100.0000 ug | PREFILLED_SYRINGE | INTRAMUSCULAR | Status: DC

## 2024-11-21 NOTE — Progress Notes (Signed)
 " PROGRESS NOTE    Brendan [name].  FMW:[id number] DOB: Dec 21, 1955 DOA: 11/11/2024 PCP: Rexanne Ingle, MD   Brief Narrative:   69 year old male with PMH of DM2, HTN, HLD, poor thyroidism, OSA on CPAP, Fournier's gangrene (October 2025), CKD stage V recently started on HD, recent admission for fall with TBI (12/16-12/18/25) who presented to the ED with fatigue. He was found to be febrile with leukocytosis and was managed for sepsis with unclear etiology. On 12/25, there was concern for DKA and he was managed in the progressive unit. 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 His blood cultures grew MSSA and proteus, for which ID was consulted. Potential sources included HD line and psoraitic rash on his palms. His HD line was removed on 12/26, and replaced on 12/30 after a line holiday. He underwent TEE on 12/31, which was negative for endocarditis.    Pending CIR placement.  Assessment & Plan:  Principal Problem:   Sepsis (HCC) Active Problems:   Insulin -requiring or dependent type II diabetes mellitus (HCC)   Hypotension   History of traumatic brain injury   ESRD on dialysis (HCC)   Heart failure with preserved ejection fraction (HCC)   Open wound   Hypothyroidism   Anemia of chronic disease   Hyperlipidemia associated with type 2 diabetes mellitus (HCC)   OSA (obstructive sleep apnea)   Fever, unknown origin   MSSA bacteremia   Bacteremia   Psoriasis   Bloodstream infection due to central venous catheter   Sepsis secondary to MSSA and proteus mirabilis bacteremia related to HD line HD cath removed by IR on 12/26 and replaced on 12/30 after a line holiday S/p TEE with no evidence of endocarditis Still with leukocytosis but no other focal symptoms Seen by ID, plan for cefepime  with HD till 1/9 to complete 2 weeks following line removal, nephrology is aware    DM  type 2 with hyperglycemia Lantus  increased to 20U and  increased aspart to 5U TID, continue SSI   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 Dianah (dermatology) for the past 6 years Has not been able to obtain Bimzelx yet holding clobetasol  at this time   AKI now advanced to ESRD Next HD planned for 1/3   HFpEF NYHA IV Will not recommend SGLT2 in the future given groin wounds Continue to hold amlodipine , and torsemide  for now Resumed coreg  at a lower dose   Hypothyroidism Continue Synthroid  125   Recent TBI brain managed nonsurgically during hospitalization 12/16-12/9. 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 Victory Boers 12/26 neurosurgery --absence of mental status changes or focal deficit --nothing surgically specific to do in this case   Scrotal wound in the setting of Fournier's gangrene as above Last seen [facility] surgery 10/13/2024 WOC following   BMI 36 Recommend diet/lifestyle modification   Hyponatremia - improved with HD, continue fluid restriction    Constipation - continue bowel regimen with miralax , senokot, PRN lactulose   Disposition: CIR    DVT prophylaxis: Place and maintain sequential compression device Start: 11/11/24 1752     Code Status: Full Code Family Communication:  None at the bedside Status is: Inpatient Remains inpatient appropriate because: Pending CIR placement    Subjective:  No acute events ovenight. He was on nasal CPAP. Denied any active complaints.  Examination:  General exam: Appears calm and comfortable  Respiratory system: Clear to auscultation. Respiratory effort normal. Cardiovascular system: S1 & S2 heard, RRR. No JVD, murmurs, rubs, gallops or clicks. No pedal edema. Gastrointestinal system: Abdomen is nondistended, soft and nontender. No organomegaly or masses felt. Normal bowel sounds heard. Central nervous system: Alert and oriented. No focal  neurological deficits. Extremities: Symmetric 5 x 5 power. Skin: No rashes, lesions or ulcers Psychiatry: Judgement and insight appear normal. Mood & affect appropriate.     Diet Orders (From admission, onward)     Start     Ordered   11/17/24 1620  Diet Carb Modified Room service appropriate? Yes; Fluid restriction: 1500 mL Fluid  Diet effective now       Question Answer Comment  Diet-HS Snack? Nothing   Calorie Level Medium 1600-2000   Fluid consistency: Thin   Room service appropriate? Yes   Fluid restriction: 1500 mL Fluid      11/17/24 1619            Objective: Vitals:   11/20/24 2359 11/21/24 0500 11/21/24 0536 11/21/24 0828  BP: 132/62  134/64 (!) 140/69  Pulse: 67  66 66  Resp:      Temp: 98.6 F (37 C)  98.6 F (37 C) 98 F (36.7 C)  TempSrc: Oral     SpO2: 100%  93% 95%  Weight:  121.9 kg    Height:       No intake or output data in the 24 hours ending 11/21/24 1025 Filed Weights   11/19/24 1719 11/20/24 0500 11/21/24 0500  Weight: 121.4 kg 121.7 kg 121.9 kg    Scheduled Meds:  calcitRIOL   0.25 mcg Oral Daily   carvedilol   6.25 mg Oral BID WC   Chlorhexidine  Gluconate Cloth  6 each Topical Q0600   darbepoetin (ARANESP ) injection - DIALYSIS  100 mcg Subcutaneous Q Sat-1800   insulin  aspart  0-5 Units Subcutaneous QHS   insulin  aspart  0-6 Units Subcutaneous TID WC   insulin  aspart  5 Units Subcutaneous TID WC   insulin  glargine  20 Units Subcutaneous Daily   levothyroxine   125 mcg Oral q AM   polyethylene glycol  17 g Oral BID   senna-docusate  1 tablet Oral BID   simvastatin   20 mg Oral QPM   sodium chloride  flush  10-40 mL Intracatheter Q12H   sodium chloride  flush  3 mL Intravenous Q12H   Continuous Infusions:  ceFEPime  (MAXIPIME ) IV 2 g (11/19/24 1845)    Nutritional status     Body mass index is 34.5 kg/m.  Data Reviewed:   CBC: Recent Labs  Lab 11/15/24 0326 11/16/24 0355 11/17/24 0246 11/18/24 1019 11/20/24 0203  WBC  22.4* 19.5* 17.6* 18.0* 22.5*  NEUTROABS 18.3*  --   --  12.9*  --   HGB 9.5* 9.6* 9.1* 9.6* 8.9*  HCT 27.2* 27.7* 25.9* 26.9* 25.8*  MCV 81.9 81.0 79.9* 78.7* 80.9  PLT PLATELET CLUMPS NOTED ON SMEAR, UNABLE TO ESTIMATE 67* 93* 125* 216   Basic Metabolic Panel: Recent Labs  Lab 11/15/24 0326 11/16/24 0355 11/17/24 0246 11/18/24 1018 11/19/24 1039 11/20/24 0203 11/21/24 0532  NA 127*   < > 126* 129* 130* 132* 133*  K 4.4   < > 4.3 4.0 4.2 3.6 3.8  CL 91*   < > 92* 92* 94* 95* 96*  CO2 25   < > 23 23 21* 27 27  GLUCOSE 298*   < > 265* 240* 272* 260* 169*  BUN 54*   < >  66* 40* 52* 30* 36*  CREATININE 5.30*   < > 6.18* 4.09* 5.33* 3.97* 5.42*  CALCIUM 8.1*   < > 8.2* 8.0* 8.1* 8.0* 8.2*  PHOS 3.1  --   --   --  4.5 3.1 3.8   < > = values in this interval not displayed.   GFR: Estimated Creatinine Clearance: 17.8 mL/min (A) (by C-G formula based on SCr of 5.42 mg/dL (H)). Liver Function Tests: Recent Labs  Lab 11/16/24 0355 11/17/24 0246 11/18/24 1018 11/19/24 1039 11/20/24 0203 11/21/24 0532  AST <10* 11* 11*  --   --   --   ALT 6 7 6   --   --   --   ALKPHOS 131* 122 124  --   --   --   BILITOT 0.6 0.5 0.6  --   --   --   PROT 6.1* 6.0* 6.3*  --   --   --   ALBUMIN  2.3* 2.3* 2.4* 2.3* 2.3* 2.3*   No results for input(s): LIPASE, AMYLASE in the last 168 hours. No results for input(s): AMMONIA in the last 168 hours. Coagulation Profile: No results for input(s): INR, PROTIME in the last 168 hours. Cardiac Enzymes: No results for input(s): CKTOTAL, CKMB, CKMBINDEX, TROPONINI in the last 168 hours. BNP (last 3 results) Recent Labs    08/28/24 1613  PROBNP 6,038.0*   HbA1C: No results for input(s): HGBA1C in the last 72 hours. CBG: Recent Labs  Lab 11/20/24 0746 11/20/24 1211 11/20/24 1703 11/20/24 2046 11/21/24 0819  GLUCAP 259* 233* 219* 197* 201*   Lipid Profile: No results for input(s): CHOL, HDL, LDLCALC, TRIG, CHOLHDL,  LDLDIRECT in the last 72 hours. Thyroid  Function Tests: No results for input(s): TSH, T4TOTAL, FREET4, T3FREE, THYROIDAB in the last 72 hours. Anemia Panel: No results for input(s): VITAMINB12, FOLATE, FERRITIN, TIBC, IRON , RETICCTPCT in the last 72 hours. Sepsis Labs: No results for input(s): PROCALCITON, LATICACIDVEN in the last 168 hours.  Recent Results (from the past 240 hours)  Resp panel by RT-PCR (RSV, Flu A&B, Covid) Anterior Nasal Swab     Status: None   Collection Time: 11/11/24 11:25 AM   Specimen: Anterior Nasal Swab  Result Value Ref Range Status   SARS Coronavirus 2 by RT PCR NEGATIVE NEGATIVE Final   Influenza A by PCR NEGATIVE NEGATIVE Final   Influenza B by PCR NEGATIVE NEGATIVE Final    Comment: (NOTE) The Xpert Xpress SARS-CoV-2/FLU/RSV plus assay is intended as an aid in the diagnosis of influenza from Nasopharyngeal swab specimens and should not be used as a sole basis for treatment. Nasal washings and aspirates are unacceptable for Xpert Xpress SARS-CoV-2/FLU/RSV testing.  Fact Sheet for Patients: bloggercourse.com  Fact Sheet for Healthcare Providers: seriousbroker.it  This test is not yet approved or cleared by the [place]  FDA and has been authorized for detection and/or diagnosis of SARS-CoV-2 by FDA under an Emergency Use Authorization (EUA). This EUA will remain in effect (meaning this test can be used) for the duration of the COVID-19 declaration under Section 564(b)(1) of the Act, 21 U.S.C. section 360bbb-3(b)(1), unless the authorization is terminated or revoked.     Resp Syncytial Virus by PCR NEGATIVE NEGATIVE Final    Comment: (NOTE) Fact Sheet for Patients: bloggercourse.com  Fact Sheet for Healthcare Providers: seriousbroker.it  This test is not yet approved or cleared by the [place]  FDA and has  been authorized for detection and/or diagnosis of SARS-CoV-2 by FDA under an Emergency Use  Authorization (EUA). This EUA will remain in effect (meaning this test can be used) for the duration of the COVID-19 declaration under Section 564(b)(1) of the Act, 21 U.S.C. section 360bbb-3(b)(1), unless the authorization is terminated or revoked.  Performed at [facility], [address] [place], [place] [address]   Blood culture (routine x 2)     Status: Abnormal   Collection Time: 11/11/24  1:29 PM   Specimen: BLOOD  Result Value Ref Range Status   Specimen Description BLOOD SITE NOT SPECIFIED  Final   Special Requests   Final    BOTTLES DRAWN AEROBIC AND ANAEROBIC Blood Culture results may not be optimal due to an inadequate volume of blood received in culture bottles   Culture  Setup Time   Final    GRAM POSITIVE COCCI IN CLUSTERS GRAM NEGATIVE RODS IN BOTH AEROBIC AND ANAEROBIC BOTTLES CRITICAL RESULT CALLED TO, READ BACK BY AND VERIFIED WITH: PHARMD G ABBOTT 11/12/2024 @ 0347 BY AB Performed at [facility], [address] [place], [place] [address]    Culture PROTEUS MIRABILIS STAPHYLOCOCCUS AUREUS  (A)  Final   Report Status 11/15/2024 FINAL  Final   Organism ID, Bacteria PROTEUS MIRABILIS  Final   Organism ID, Bacteria STAPHYLOCOCCUS AUREUS  Final      Susceptibility   Proteus mirabilis - MIC*    AMPICILLIN  <=2 SENSITIVE Sensitive     CEFAZOLIN  (NON-URINE) 4 INTERMEDIATE Intermediate     CEFEPIME  <=0.12 SENSITIVE Sensitive     ERTAPENEM <=0.12 SENSITIVE Sensitive     CEFTRIAXONE  <=0.25 SENSITIVE Sensitive     CIPROFLOXACIN <=0.06 SENSITIVE Sensitive     GENTAMICIN <=1 SENSITIVE Sensitive     MEROPENEM 1 SENSITIVE Sensitive     TRIMETH/SULFA <=20 SENSITIVE Sensitive     AMPICILLIN /SULBACTAM <=2 SENSITIVE Sensitive     PIP/TAZO Value in next row Sensitive      <=4 SENSITIVEThis is a modified FDA-approved test that has been validated and its performance  characteristics determined by the reporting laboratory.  This laboratory is certified under the Clinical Laboratory Improvement Amendments CLIA as qualified to perform high complexity clinical laboratory testing.    * PROTEUS MIRABILIS   Staphylococcus aureus - MIC*    CIPROFLOXACIN Value in next row Sensitive      <=4 SENSITIVEThis is a modified FDA-approved test that has been validated and its performance characteristics determined by the reporting laboratory.  This laboratory is certified under the Clinical Laboratory Improvement Amendments CLIA as qualified to perform high complexity clinical laboratory testing.    ERYTHROMYCIN Value in next row Resistant      <=4 SENSITIVEThis is a modified FDA-approved test that has been validated and its performance characteristics determined by the reporting laboratory.  This laboratory is certified under the Clinical Laboratory Improvement Amendments CLIA as qualified to perform high complexity clinical laboratory testing.    GENTAMICIN Value in next row Sensitive      <=4 SENSITIVEThis is a modified FDA-approved test that has been validated and its performance characteristics determined by the reporting laboratory.  This laboratory is certified under the Clinical Laboratory Improvement Amendments CLIA as qualified to perform high complexity clinical laboratory testing.    OXACILLIN Value in next row Sensitive      <=4 SENSITIVEThis is a modified FDA-approved test that has been validated and its performance characteristics determined by the reporting laboratory.  This laboratory is certified under the Clinical Laboratory Improvement Amendments CLIA as qualified to perform high complexity clinical  laboratory testing.    TETRACYCLINE Value in next row Resistant      <=4 SENSITIVEThis is a modified FDA-approved test that has been validated and its performance characteristics determined by the reporting laboratory.  This laboratory is certified under the Clinical  Laboratory Improvement Amendments CLIA as qualified to perform high complexity clinical laboratory testing.    VANCOMYCIN  Value in next row Sensitive      <=4 SENSITIVEThis is a modified FDA-approved test that has been validated and its performance characteristics determined by the reporting laboratory.  This laboratory is certified under the Clinical Laboratory Improvement Amendments CLIA as qualified to perform high complexity clinical laboratory testing.    TRIMETH/SULFA Value in next row Sensitive      <=4 SENSITIVEThis is a modified FDA-approved test that has been validated and its performance characteristics determined by the reporting laboratory.  This laboratory is certified under the Clinical Laboratory Improvement Amendments CLIA as qualified to perform high complexity clinical laboratory testing.    CLINDAMYCIN Value in next row Resistant      <=4 SENSITIVEThis is a modified FDA-approved test that has been validated and its performance characteristics determined by the reporting laboratory.  This laboratory is certified under the Clinical Laboratory Improvement Amendments CLIA as qualified to perform high complexity clinical laboratory testing.    RIFAMPIN Value in next row Sensitive      <=4 SENSITIVEThis is a modified FDA-approved test that has been validated and its performance characteristics determined by the reporting laboratory.  This laboratory is certified under the Clinical Laboratory Improvement Amendments CLIA as qualified to perform high complexity clinical laboratory testing.    Inducible Clindamycin Value in next row Sensitive      <=4 SENSITIVEThis is a modified FDA-approved test that has been validated and its performance characteristics determined by the reporting laboratory.  This laboratory is certified under the Clinical Laboratory Improvement Amendments CLIA as qualified to perform high complexity clinical laboratory testing.    LINEZOLID  Value in next row Sensitive       <=4 SENSITIVEThis is a modified FDA-approved test that has been validated and its performance characteristics determined by the reporting laboratory.  This laboratory is certified under the Clinical Laboratory Improvement Amendments CLIA as qualified to perform high complexity clinical laboratory testing.    * STAPHYLOCOCCUS AUREUS  Blood Culture ID Panel (Reflexed)     Status: Abnormal   Collection Time: 11/11/24  1:29 PM  Result Value Ref Range Status   Enterococcus faecalis NOT DETECTED NOT DETECTED Corrected   Enterococcus Faecium NOT DETECTED NOT DETECTED Corrected   Listeria monocytogenes NOT DETECTED NOT DETECTED Corrected   Staphylococcus species DETECTED (A) NOT DETECTED Corrected    Comment: CRITICAL RESULT CALLED TO, READ BACK BY AND VERIFIED WITH: PHARMD G ABBOTT 11/12/2024 @ 0347 BY AB    Staphylococcus aureus (BCID) DETECTED (A) NOT DETECTED Corrected    Comment: CRITICAL RESULT CALLED TO, READ BACK BY AND VERIFIED WITH: PHARMD G ABBOTT 11/12/2024 @ 0347 BY AB    Staphylococcus epidermidis NOT DETECTED NOT DETECTED Corrected   Staphylococcus lugdunensis NOT DETECTED NOT DETECTED Corrected   Streptococcus species NOT DETECTED NOT DETECTED Corrected   Streptococcus agalactiae NOT DETECTED NOT DETECTED Corrected   Streptococcus pneumoniae NOT DETECTED NOT DETECTED Corrected   Streptococcus pyogenes NOT DETECTED NOT DETECTED Corrected   A.calcoaceticus-baumannii NOT DETECTED NOT DETECTED Corrected   Bacteroides fragilis NOT DETECTED NOT DETECTED Corrected   Enterobacterales DETECTED (A) NOT DETECTED Corrected    Comment: Enterobacterales represent a  large order of gram negative bacteria, not a single organism. CRITICAL RESULT CALLED TO, READ BACK BY AND VERIFIED WITH: PHARMD G ABBOTT 11/12/2024 @ 0347 BY AB    Enterobacter cloacae complex NOT DETECTED NOT DETECTED Corrected   Escherichia coli NOT DETECTED NOT DETECTED Corrected   Klebsiella aerogenes NOT DETECTED NOT DETECTED  Corrected   Klebsiella oxytoca NOT DETECTED NOT DETECTED Corrected   Klebsiella pneumoniae NOT DETECTED NOT DETECTED Corrected   Proteus species DETECTED (A) NOT DETECTED Corrected    Comment: CRITICAL RESULT CALLED TO, READ BACK BY AND VERIFIED WITH: PHARMD G ABBOTT 11/12/2024 @ 0347 BY AB    Salmonella species NOT DETECTED NOT DETECTED Corrected   Serratia marcescens NOT DETECTED NOT DETECTED Corrected   Haemophilus influenzae NOT DETECTED NOT DETECTED Corrected   Neisseria meningitidis NOT DETECTED NOT DETECTED Corrected   Pseudomonas aeruginosa NOT DETECTED NOT DETECTED Corrected   Stenotrophomonas maltophilia NOT DETECTED NOT DETECTED Corrected   Candida albicans NOT DETECTED NOT DETECTED Corrected   Candida auris NOT DETECTED NOT DETECTED Corrected   Candida glabrata NOT DETECTED NOT DETECTED Corrected   Candida krusei NOT DETECTED NOT DETECTED Corrected   Candida parapsilosis NOT DETECTED NOT DETECTED Corrected   Candida tropicalis NOT DETECTED NOT DETECTED Corrected   Cryptococcus neoformans/gattii NOT DETECTED NOT DETECTED Corrected   CTX-M ESBL NOT DETECTED NOT DETECTED Corrected   Carbapenem resistance IMP NOT DETECTED NOT DETECTED Corrected   Carbapenem resistance KPC NOT DETECTED NOT DETECTED Corrected   Meth resistant mecA/C and MREJ NOT DETECTED NOT DETECTED Corrected   Carbapenem resistance NDM NOT DETECTED NOT DETECTED Corrected   Carbapenem resist OXA 48 LIKE NOT DETECTED NOT DETECTED Corrected   Carbapenem resistance VIM NOT DETECTED NOT DETECTED Corrected    Comment: Performed at [facility], [address], [place], [place] [address]  Blood culture (routine x 2)     Status: None   Collection Time: 11/11/24  8:57 PM   Specimen: BLOOD LEFT HAND  Result Value Ref Range Status   Specimen Description BLOOD LEFT HAND  Final   Special Requests   Final    BOTTLES DRAWN AEROBIC AND ANAEROBIC Blood Culture adequate volume   Culture   Final    NO GROWTH 5  DAYS Performed at [facility], [address] [place], [place] [address]    Report Status 11/16/2024 FINAL  Final  MRSA Next Gen by PCR, Nasal     Status: None   Collection Time: 11/11/24  9:48 PM   Specimen: Nasal Mucosa; Nasal Swab  Result Value Ref Range Status   MRSA by PCR Next Gen NOT DETECTED NOT DETECTED Final    Comment: (NOTE) The GeneXpert MRSA Assay (FDA approved for NASAL specimens only), is one component of a comprehensive MRSA colonization surveillance program. It is not intended to diagnose MRSA infection nor to guide or monitor treatment for MRSA infections. Test performance is not FDA approved in patients less than 7 years old. Performed at [facility], [address], [place], [place] [address]   Respiratory (~20 pathogens) panel by PCR     Status: None   Collection Time: 11/12/24 10:28 AM   Specimen: Nasopharyngeal Swab; Respiratory  Result Value Ref Range Status   Adenovirus NOT DETECTED NOT DETECTED Final   Coronavirus 229E NOT DETECTED NOT DETECTED Final    Comment: (NOTE) The Coronavirus on the Respiratory Panel, DOES NOT test for the novel  Coronavirus (2019 nCoV)    Coronavirus  HKU1 NOT DETECTED NOT DETECTED Final   Coronavirus NL63 NOT DETECTED NOT DETECTED Final   Coronavirus OC43 NOT DETECTED NOT DETECTED Final   Metapneumovirus NOT DETECTED NOT DETECTED Final   Rhinovirus / Enterovirus NOT DETECTED NOT DETECTED Final   Influenza A NOT DETECTED NOT DETECTED Final   Influenza B NOT DETECTED NOT DETECTED Final   Parainfluenza Virus 1 NOT DETECTED NOT DETECTED Final   Parainfluenza Virus 2 NOT DETECTED NOT DETECTED Final   Parainfluenza Virus 3 NOT DETECTED NOT DETECTED Final   Parainfluenza Virus 4 NOT DETECTED NOT DETECTED Final   Respiratory Syncytial Virus NOT DETECTED NOT DETECTED Final   Bordetella pertussis NOT DETECTED NOT DETECTED Final   Bordetella Parapertussis NOT DETECTED NOT DETECTED Final   Chlamydophila pneumoniae NOT  DETECTED NOT DETECTED Final   Mycoplasma pneumoniae NOT DETECTED NOT DETECTED Final    Comment: Performed at [facility], [address], [place], [place] [address]  Culture, blood (Routine X 2) w Reflex to ID Panel     Status: None   Collection Time: 11/13/24  7:29 AM   Specimen: BLOOD RIGHT HAND  Result Value Ref Range Status   Specimen Description BLOOD RIGHT HAND  Final   Special Requests   Final    BOTTLES DRAWN AEROBIC AND ANAEROBIC Blood Culture results may not be optimal due to an inadequate volume of blood received in culture bottles   Culture   Final    NO GROWTH 5 DAYS Performed at [facility], [address], [place], [place] [address]    Report Status 11/18/2024 FINAL  Final  Culture, blood (Routine X 2) w Reflex to ID Panel     Status: None   Collection Time: 11/13/24  7:29 AM   Specimen: BLOOD LEFT ARM  Result Value Ref Range Status   Specimen Description BLOOD LEFT ARM  Final   Special Requests   Final    BOTTLES DRAWN AEROBIC AND ANAEROBIC Blood Culture results may not be optimal due to an inadequate volume of blood received in culture bottles   Culture   Final    NO GROWTH 5 DAYS Performed at [facility], [address] [place], [place] [address]    Report Status 11/18/2024 FINAL  Final         Radiology Studies: No results found.         LOS: 10 days   Time spent= 35 mins    Deliliah Room, MD [facility]  If 7PM-7AM, please contact night-coverage  11/21/2024, 10:25 AM  "

## 2024-11-21 NOTE — Progress Notes (Signed)
" °   11/21/24 1858  Vitals  Pulse Rate 73  Resp 12  BP (!) 111/54  SpO2 94 %  O2 Device Room Air  Oxygen Therapy  Patient Activity (if Appropriate) In bed  Pulse Oximetry Type Continuous  During Treatment Monitoring  Blood Flow Rate (mL/min) 400 mL/min  Arterial Pressure (mmHg) -240.39 mmHg  Venous Pressure (mmHg) 186.45 mmHg  TMP (mmHg) -22.63 mmHg  Ultrafiltration Rate (mL/min) 0 mL/min  Dialysate Flow Rate (mL/min) 0 ml/min  Dialysate Potassium Concentration 3  Dialysate Calcium Concentration 2.5  Duration of HD Treatment -hour(s) 3.45 hour(s)  Cumulative Fluid Removed (mL) per Treatment  1313.51  HD Safety Checks Performed Yes  Intra-Hemodialysis Comments Tx completed;Tolerated well   Received patient in bed to unit.  Alert and oriented.  Informed consent signed and in chart.   TX duration: 3.5 hours  Patient tolerated well.  Transported back to the room  Alert, without acute distress.  Hand-off given to patient's nurse.   Access used: [facility] Access issues: None  Total UF removed: 1313.51 ml of 3000 ml due to asymptomatic hypotension Medication(s) given: None Post HD weight: 121.6 kg Post HD VS: See data above   Charmaine HERO [facility] "

## 2024-11-21 NOTE — Plan of Care (Signed)
  Problem: Education: Goal: Ability to describe self-care measures that may prevent or decrease complications (Diabetes Survival Skills Education) will improve Outcome: Progressing Goal: Individualized Educational Video(s) Outcome: Progressing   Problem: Coping: Goal: Ability to adjust to condition or change in health will improve Outcome: Progressing   Problem: Fluid Volume: Goal: Ability to maintain a balanced intake and output will improve Outcome: Progressing   Problem: Health Behavior/Discharge Planning: Goal: Ability to identify and utilize available resources and services will improve Outcome: Progressing Goal: Ability to manage health-related needs will improve Outcome: Progressing   Problem: Metabolic: Goal: Ability to maintain appropriate glucose levels will improve Outcome: Progressing   Problem: Nutritional: Goal: Maintenance of adequate nutrition will improve Outcome: Progressing Goal: Progress toward achieving an optimal weight will improve Outcome: Progressing   Problem: Skin Integrity: Goal: Risk for impaired skin integrity will decrease Outcome: Progressing   Problem: Tissue Perfusion: Goal: Adequacy of tissue perfusion will improve Outcome: Progressing   Problem: Education: Goal: Knowledge of General Education information will improve Description: Including pain rating scale, medication(s)/side effects and non-pharmacologic comfort measures Outcome: Progressing   Problem: Health Behavior/Discharge Planning: Goal: Ability to manage health-related needs will improve Outcome: Progressing   Problem: Clinical Measurements: Goal: Ability to maintain clinical measurements within normal limits will improve Outcome: Progressing Goal: Will remain free from infection Outcome: Progressing Goal: Diagnostic test results will improve Outcome: Progressing Goal: Respiratory complications will improve Outcome: Progressing Goal: Cardiovascular complication will  be avoided Outcome: Progressing   Problem: Activity: Goal: Risk for activity intolerance will decrease Outcome: Progressing   Problem: Nutrition: Goal: Adequate nutrition will be maintained Outcome: Progressing   Problem: Coping: Goal: Level of anxiety will decrease Outcome: Progressing   Problem: Elimination: Goal: Will not experience complications related to bowel motility Outcome: Progressing Goal: Will not experience complications related to urinary retention Outcome: Progressing   Problem: Pain Managment: Goal: General experience of comfort will improve and/or be controlled Outcome: Progressing   Problem: Safety: Goal: Ability to remain free from injury will improve Outcome: Progressing   Problem: Skin Integrity: Goal: Risk for impaired skin integrity will decrease Outcome: Progressing   Problem: Education: Goal: Ability to describe self-care measures that may prevent or decrease complications (Diabetes Survival Skills Education) will improve Outcome: Progressing Goal: Individualized Educational Video(s) Outcome: Progressing   Problem: Cardiac: Goal: Ability to maintain an adequate cardiac output will improve Outcome: Progressing   Problem: Health Behavior/Discharge Planning: Goal: Ability to identify and utilize available resources and services will improve Outcome: Progressing Goal: Ability to manage health-related needs will improve Outcome: Progressing   Problem: Fluid Volume: Goal: Ability to achieve a balanced intake and output will improve Outcome: Progressing   Problem: Metabolic: Goal: Ability to maintain appropriate glucose levels will improve Outcome: Progressing   Problem: Nutritional: Goal: Maintenance of adequate nutrition will improve Outcome: Progressing Goal: Maintenance of adequate weight for body size and type will improve Outcome: Progressing   Problem: Respiratory: Goal: Will regain and/or maintain adequate  ventilation Outcome: Progressing   Problem: Urinary Elimination: Goal: Ability to achieve and maintain adequate renal perfusion and functioning will improve Outcome: Progressing

## 2024-11-21 NOTE — Plan of Care (Signed)
 Id brief note   Called by primary team about rising leukocytosis without sepsis or sign of infectious sx otherwise  Healing perineal wound Psoriasis plaques Lines out and last negative bcx 12/26     A/p Mssa/proteus bsi -- suspect clabsi Leukoytosis  ?drug related   -change cefepime  to cefazolin  three times per week with dialysis to cover mssa -plan 4 weeks mssa coverage given community onset mssa bsi (negative tee) and immunosuppressant condition -continue proteus coverage with levoflox for another week, renal dosing -monitor cbc with diff and lft the next few days make sure improving -chart sent to primary team

## 2024-11-21 NOTE — Progress Notes (Signed)
 [facility] NEPHROLOGY PROGRESS NOTE    Subjective:  Last HD on 1/1 with 3 kg UF.  He had 200 mL UOP over 1/1 charted.  He feels ok today.  He hopes to work with therapy today as they have missed him recently due to procedures.   Presentation summary: 69 y.o. year-old w/ PMH as below who presented to ED on 12/16 brought by EMS from home after his wife found him unconscious on the floor with a large hematoma in the back of the head.  Also was weak on his right side.  In the ED he started vomiting.  He did go to dialysis yesterday morning and was seen normal back home at 2:30 PM.  In ED BP was 150/80, HR 78, RR 21, temp 97, 98% sat on room air.  Labs showed K+ 3.6, BUN 14, creatinine 2.4, calcium 8.6, albumin  3.5 Hgb 11, WBC 17K.  UA showed small hemoglobin and LE, rare bacteria, 11-20 WBCs.  Urine tox screen was negative, alcohol less than 15.  Head CT was concerning for mild contusions along the inferior aspect of the both frontal lobes and the right frontal convexity most c/w meningioma.  Neurosurgery and neurology were consulted and patient was given IV Keppra  load, Benadryl , Zofran , and Reglan  IV.  Patient was admitted for fall with TBI, questionable CVA.  Patient was admitted.  We are asked to see for renal failure.  Objective Vitals:   11/21/24 0500 11/21/24 0536 11/21/24 0828 11/21/24 1154  BP:  134/64 (!) 140/69 (!) 162/68  Pulse:  66 66 74  Resp:      Temp:  98.6 F (37 C) 98 F (36.7 C)   TempSrc:      SpO2:  93% 95% 97%  Weight: 121.9 kg     Height:        Physical Exam:       General adult male, no distress Neck supple trachea midline Lungs clear to auscultation  Heart S1S2 no rub Abdomen soft nontender obese habitus Ext no pitting edema Neuro alert and oriented x 3, follows commands   L internal jugular TDC intact   Outpatient Dialysis Orders:  [facility] - [facility] 3h  B400  125kg   3K bath  TDC  Hep 5000 Mircera 150mcg IV q 2 weeks (100mcg given  12/5) Calcitriol  0.5mcg PO q HD   Assessment/ Plan: Pt is a 69 y.o. yo male with AKI on HD, HTN, T2DM, recent admit for head contusion s/p fall who is being admitted with fever, leukocytosis, and weakness due to sepsis.  He has progressed to ESRD   # ESRD [facility] (note that AKI has progressed to ESRD) - he has been at [facility] - line holiday => tunneled catheter removed 12/26 and replaced 12/30 - HD per TTS schedule here - next HD today - ordered renal panel daily   # HTN/volume - optimize volume with HD  - continue fluid restriction of 1.5 liters/day   # Anemia of CKD - Continue Aranesp  100 mcg every Sat and monitor hemoglobin  # Secondary hyperparathyroidism - phos controlled, not on binders.  on calcitriol .  # Hyponatremia  - due to vol overload - resolved   # Sepsis due to MSSA and Proteus bacteremia - exact source unknown but concern for TDC infection - TDC removed by IR 12/26 - abx per ID and primary team.  Currently on cefepime  - s/p TEE on 12/31 - f/u cx's 12/26 are negative - Per ID,  plans are for cefepime  three times a week after HD through 1/9 to complete 2 weeks of abx following line removal   Myer Fret  MD  [facility] 11/21/2024, 1:34 PM  Recent Labs  Lab 11/18/24 1019 11/19/24 1039 11/20/24 0203 11/21/24 0532  HGB 9.6*  --  8.9*  --   ALBUMIN   --    < > 2.3* 2.3*  CALCIUM  --    < > 8.0* 8.2*  PHOS  --    < > 3.1 3.8  CREATININE  --    < > 3.97* 5.42*  K  --    < > 3.6 3.8   < > = values in this interval not displayed.    Inpatient medications:  calcitRIOL   0.25 mcg Oral Daily   carvedilol   6.25 mg Oral BID WC   Chlorhexidine  Gluconate Cloth  6 each Topical Q0600   darbepoetin (ARANESP ) injection - DIALYSIS  100 mcg Subcutaneous Q Sat-1800   insulin  aspart  0-5 Units Subcutaneous QHS   insulin  aspart  0-6 Units Subcutaneous TID WC   insulin  aspart  5 Units Subcutaneous TID WC   insulin  glargine  20 Units Subcutaneous Daily   levothyroxine   125 mcg  Oral q AM   polyethylene glycol  17 g Oral BID   senna-docusate  1 tablet Oral BID   simvastatin   20 mg Oral QPM   sodium chloride  flush  10-40 mL Intracatheter Q12H   sodium chloride  flush  3 mL Intravenous Q12H    ceFEPime  (MAXIPIME ) IV 2 g (11/21/24 1230)   acetaminophen  **OR** acetaminophen , albuterol , dextrose , lactulose , ondansetron  **OR** ondansetron  (ZOFRAN ) IV, sodium chloride  flush

## 2024-11-22 DIAGNOSIS — A419 Sepsis, unspecified organism: Secondary | ICD-10-CM | POA: Diagnosis not present

## 2024-11-22 LAB — HEPATIC FUNCTION PANEL
ALT: <5 U/L (ref 0–44)
AST: 12 U/L — ABNORMAL LOW (ref 15–41)
Albumin: 2.4 g/dL — ABNORMAL LOW (ref 3.5–5.0)
Alkaline Phosphatase: 101 U/L (ref 38–126)
Bilirubin, Direct: 0.2 mg/dL (ref 0.0–0.2)
Indirect Bilirubin: 0.3 mg/dL (ref 0.3–0.9)
Total Bilirubin: 0.6 mg/dL (ref 0.0–1.2)
Total Protein: 6.2 g/dL — ABNORMAL LOW (ref 6.5–8.1)

## 2024-11-22 LAB — CBC WITH DIFFERENTIAL/PLATELET
Abs Immature Granulocytes: 0.44 K/uL — ABNORMAL HIGH (ref 0.00–0.07)
Basophils Absolute: 0.2 K/uL — ABNORMAL HIGH (ref 0.0–0.1)
Basophils Relative: 1 %
Eosinophils Absolute: 0.4 K/uL (ref 0.0–0.5)
Eosinophils Relative: 2 %
HCT: 25 % — ABNORMAL LOW (ref 39.0–52.0)
Hemoglobin: 8.4 g/dL — ABNORMAL LOW (ref 13.0–17.0)
Immature Granulocytes: 2 %
Lymphocytes Relative: 8 %
Lymphs Abs: 1.8 K/uL (ref 0.7–4.0)
MCH: 27.8 pg (ref 26.0–34.0)
MCHC: 33.6 g/dL (ref 30.0–36.0)
MCV: 82.8 fL (ref 80.0–100.0)
Monocytes Absolute: 1.9 K/uL — ABNORMAL HIGH (ref 0.1–1.0)
Monocytes Relative: 8 %
Neutro Abs: 19.3 K/uL — ABNORMAL HIGH (ref 1.7–7.7)
Neutrophils Relative %: 79 %
Platelets: 241 K/uL (ref 150–400)
RBC: 3.02 MIL/uL — ABNORMAL LOW (ref 4.22–5.81)
RDW: 17 % — ABNORMAL HIGH (ref 11.5–15.5)
WBC: 24.1 K/uL — ABNORMAL HIGH (ref 4.0–10.5)
nRBC: 0 % (ref 0.0–0.2)

## 2024-11-22 LAB — RENAL FUNCTION PANEL
Albumin: 2.2 g/dL — ABNORMAL LOW (ref 3.5–5.0)
Anion gap: 11 (ref 5–15)
BUN: 24 mg/dL — ABNORMAL HIGH (ref 8–23)
CO2: 25 mmol/L (ref 22–32)
Calcium: 7.9 mg/dL — ABNORMAL LOW (ref 8.9–10.3)
Chloride: 95 mmol/L — ABNORMAL LOW (ref 98–111)
Creatinine, Ser: 4.1 mg/dL — ABNORMAL HIGH (ref 0.61–1.24)
GFR, Estimated: 15 mL/min — ABNORMAL LOW
Glucose, Bld: 332 mg/dL — ABNORMAL HIGH (ref 70–99)
Phosphorus: 4.3 mg/dL (ref 2.5–4.6)
Potassium: 4.2 mmol/L (ref 3.5–5.1)
Sodium: 131 mmol/L — ABNORMAL LOW (ref 135–145)

## 2024-11-22 LAB — GLUCOSE, CAPILLARY
Glucose-Capillary: 313 mg/dL — ABNORMAL HIGH (ref 70–99)
Glucose-Capillary: 230 mg/dL — ABNORMAL HIGH (ref 70–99)
Glucose-Capillary: 225 mg/dL — ABNORMAL HIGH (ref 70–99)
Glucose-Capillary: 240 mg/dL — ABNORMAL HIGH (ref 70–99)

## 2024-11-22 MED ORDER — INSULIN GLARGINE-YFGN 100 UNIT/ML ~~LOC~~ SOLN
25.0000 [IU] | Freq: Every day | SUBCUTANEOUS | Status: DC
  Administered 2024-11-23: 25 [IU] via SUBCUTANEOUS
  Filled 2024-11-22: qty 0.25

## 2024-11-22 MED ORDER — INSULIN GLARGINE 100 UNIT/ML ~~LOC~~ SOLN: 25.0000 [IU] | Freq: Every day | SUBCUTANEOUS | Status: DC

## 2024-11-22 NOTE — Progress Notes (Signed)
 Brendan [name]    Subjective:  Seen in room No c/o's today, no issues w/ HD yest   Presentation summary: 69 y.o. year-old w/ PMH as below who presented to ED on 12/16 brought by EMS from home after his wife found him unconscious on the floor with a large hematoma in the back of the head.  Also was weak on his right side.  In the ED he started vomiting.  He did go to dialysis yesterday morning and was seen normal back home at 2:30 PM.  In ED BP was 150/80, HR 78, RR 21, temp 97, 98% sat on room air.  Labs showed K+ 3.6, BUN 14, creatinine 2.4, calcium 8.6, albumin  3.5 Hgb 11, WBC 17K.  UA showed small hemoglobin and LE, rare bacteria, 11-20 WBCs.  Urine tox screen was negative, alcohol less than 15.  Head CT was concerning for mild contusions along the inferior aspect of the both frontal lobes and the right frontal convexity most c/w meningioma.  Neurosurgery and neurology were consulted and patient was given IV Keppra  load, Benadryl , Zofran , and Reglan  IV.  Patient was admitted for fall with TBI, questionable CVA.  Patient was admitted.  We are asked to see for renal failure.  Objective Vitals:   11/21/24 0500 11/21/24 0536 11/21/24 0828 11/21/24 1154  BP:  134/64 (!) 140/69 (!) 162/68  Pulse:  66 66 74  Resp:      Temp:  98.6 F (37 C) 98 F (36.7 C)   TempSrc:      SpO2:  93% 95% 97%  Weight: 121.9 kg     Height:        Physical Exam:       General adult male, no distress Neck supple trachea midline Lungs clear to auscultation  Heart S1S2 no rub Abdomen soft nontender obese habitus Ext no pitting edema Neuro alert and oriented x 3, follows commands   L internal jugular TDC intact   Outpatient Dialysis Orders:  [facility] 3h  B400  125kg   3K bath  TDC  Hep 5000 Mircera 150mcg IV q 2 weeks (100mcg given 12/5) Calcitriol  0.5mcg PO q HD   Assessment/ Plan: Pt is a 69 y.o. yo male with AKI on HD, HTN, T2DM, recent admit for head  contusion s/p fall who is being admitted with fever, leukocytosis, and weakness due to sepsis.  He has progressed to ESRD   # ESRD [facility] ([name] that AKI has progressed to ESRD) - he has been at [facility] - line holiday => tunneled catheter removed 12/26 and replaced 12/30 - HD per TTS schedule here - next HD 1/06 if still here  # HTN/volume - 4kg under dry wt today, bp's stable - prob lower dry wt upon dc - continue fluid restriction of 1.5 liters/day   # Anemia of CKD - Continue Aranesp  100 mcg every Sat and monitor hemoglobin  # Secondary hyperparathyroidism - phos controlled, not on binders.  on calcitriol .  # Hyponatremia  - due to vol overload - resolved   # Sepsis due to MSSA and Proteus bacteremia - exact source unknown but concern for TDC infection - TDC removed by IR 12/26 - abx per ID and primary team.  Currently on cefepime  - s/p TEE (negative) on 12/31 - f/u cx's 12/26 are negative - Per ID -> cefepime  will be changed to IV cefazolin  three times per week w/ dialysis. Plan 4 weeks total for mssa coverage  thru 12/11/24 (=4 wks from line removal). For proteus will get another week of levofloxacin  at renal dosing.   # Dispo - pending CIR approval   Rob Geralynn  MD  [facility] 11/22/2024, 1:09 PM  Recent Labs  Lab 11/21/24 0532 11/21/24 1510 11/22/24 0600 11/22/24 0601  HGB  --  8.9* 8.4*  --   ALBUMIN  2.3*  --  2.4* 2.2*  CALCIUM 8.2*  --   --  7.9*  PHOS 3.8  --   --  4.3  CREATININE 5.42*  --   --  4.10*  K 3.8  --   --  4.2    Inpatient medications:  calcitRIOL   0.25 mcg Oral Daily   carvedilol   6.25 mg Oral BID WC   Chlorhexidine  Gluconate Cloth  6 each Topical Q0600   [START ON 11/24/2024] darbepoetin (ARANESP ) injection - DIALYSIS  100 mcg Subcutaneous Q Tue-1800   insulin  aspart  0-5 Units Subcutaneous QHS   insulin  aspart  0-6 Units Subcutaneous TID WC   insulin  aspart  5 Units Subcutaneous TID WC   insulin  glargine  20 Units Subcutaneous Daily    levofloxacin   500 mg Oral Q48H   levothyroxine   125 mcg Oral q AM   polyethylene glycol  17 g Oral BID   senna-docusate  1 tablet Oral BID   simvastatin   20 mg Oral QPM   sodium chloride  flush  10-40 mL Intracatheter Q12H   sodium chloride  flush  3 mL Intravenous Q12H    [START ON 11/24/2024]  ceFAZolin  (ANCEF ) IV     acetaminophen  **OR** acetaminophen , albuterol , dextrose , lactulose , ondansetron  **OR** ondansetron  (ZOFRAN ) IV, sodium chloride  flush

## 2024-11-22 NOTE — Plan of Care (Signed)
  Problem: Education: Goal: Ability to describe self-care measures that may prevent or decrease complications (Diabetes Survival Skills Education) will improve Outcome: Progressing Goal: Individualized Educational Video(s) Outcome: Progressing   Problem: Coping: Goal: Ability to adjust to condition or change in health will improve Outcome: Progressing   Problem: Fluid Volume: Goal: Ability to maintain a balanced intake and output will improve Outcome: Progressing   Problem: Health Behavior/Discharge Planning: Goal: Ability to identify and utilize available resources and services will improve Outcome: Progressing Goal: Ability to manage health-related needs will improve Outcome: Progressing   Problem: Metabolic: Goal: Ability to maintain appropriate glucose levels will improve Outcome: Progressing   Problem: Nutritional: Goal: Maintenance of adequate nutrition will improve Outcome: Progressing Goal: Progress toward achieving an optimal weight will improve Outcome: Progressing   Problem: Skin Integrity: Goal: Risk for impaired skin integrity will decrease Outcome: Progressing   Problem: Tissue Perfusion: Goal: Adequacy of tissue perfusion will improve Outcome: Progressing   Problem: Education: Goal: Knowledge of General Education information will improve Description: Including pain rating scale, medication(s)/side effects and non-pharmacologic comfort measures Outcome: Progressing   Problem: Health Behavior/Discharge Planning: Goal: Ability to manage health-related needs will improve Outcome: Progressing   Problem: Clinical Measurements: Goal: Ability to maintain clinical measurements within normal limits will improve Outcome: Progressing Goal: Will remain free from infection Outcome: Progressing Goal: Diagnostic test results will improve Outcome: Progressing Goal: Respiratory complications will improve Outcome: Progressing Goal: Cardiovascular complication will  be avoided Outcome: Progressing   Problem: Activity: Goal: Risk for activity intolerance will decrease Outcome: Progressing   Problem: Nutrition: Goal: Adequate nutrition will be maintained Outcome: Progressing   Problem: Coping: Goal: Level of anxiety will decrease Outcome: Progressing   Problem: Elimination: Goal: Will not experience complications related to bowel motility Outcome: Progressing Goal: Will not experience complications related to urinary retention Outcome: Progressing   Problem: Pain Managment: Goal: General experience of comfort will improve and/or be controlled Outcome: Progressing   Problem: Safety: Goal: Ability to remain free from injury will improve Outcome: Progressing   Problem: Skin Integrity: Goal: Risk for impaired skin integrity will decrease Outcome: Progressing   Problem: Education: Goal: Ability to describe self-care measures that may prevent or decrease complications (Diabetes Survival Skills Education) will improve Outcome: Progressing Goal: Individualized Educational Video(s) Outcome: Progressing   Problem: Cardiac: Goal: Ability to maintain an adequate cardiac output will improve Outcome: Progressing   Problem: Health Behavior/Discharge Planning: Goal: Ability to identify and utilize available resources and services will improve Outcome: Progressing Goal: Ability to manage health-related needs will improve Outcome: Progressing   Problem: Fluid Volume: Goal: Ability to achieve a balanced intake and output will improve Outcome: Progressing   Problem: Metabolic: Goal: Ability to maintain appropriate glucose levels will improve Outcome: Progressing   Problem: Nutritional: Goal: Maintenance of adequate nutrition will improve Outcome: Progressing Goal: Maintenance of adequate weight for body size and type will improve Outcome: Progressing   Problem: Respiratory: Goal: Will regain and/or maintain adequate  ventilation Outcome: Progressing   Problem: Urinary Elimination: Goal: Ability to achieve and maintain adequate renal perfusion and functioning will improve Outcome: Progressing

## 2024-11-22 NOTE — Progress Notes (Signed)
 " PROGRESS NOTE    Brendan [name].  FMW:[id number] DOB: 06/25/1956 DOA: 11/11/2024 PCP: Rexanne Ingle, MD   Brief Narrative:   69 year old male with PMH of DM2, HTN, HLD, poor thyroidism, OSA on CPAP, Fournier's gangrene (October 2025), CKD stage V recently started on HD, recent admission for fall with TBI (12/16-12/18/25) who presented to the ED with fatigue. He was found to be febrile with leukocytosis and was managed for sepsis with unclear etiology. On 12/25, there was concern for DKA and he was managed in the progressive unit. 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 His blood cultures grew MSSA and proteus, for which ID was consulted. Potential sources included HD line and psoraitic rash on his palms. His HD line was removed on 12/26, and replaced on 12/30 after a line holiday. He underwent TEE on 12/31, which was negative for endocarditis.    Pending CIR placement.  Assessment & Plan:  Principal Problem:   Sepsis (HCC) Active Problems:   Insulin -requiring or dependent type II diabetes mellitus (HCC)   Hypotension   History of traumatic brain injury   ESRD on dialysis (HCC)   Heart failure with preserved ejection fraction (HCC)   Open wound   Hypothyroidism   Anemia of chronic disease   Hyperlipidemia associated with type 2 diabetes mellitus (HCC)   OSA (obstructive sleep apnea)   Fever, unknown origin   MSSA bacteremia   Bacteremia   Psoriasis   Bloodstream infection due to central venous catheter   Sepsis secondary to MSSA and proteus mirabilis bacteremia related to HD line: Improving HD cath removed by IR on 12/26 and replaced on 12/30 after a line holiday S/p TEE with no evidence of endocarditis Still with leukocytosis but no other focal symptoms Seen by ID, plan for cefazolin  (changed from Cefepime  on 1/3 by ID) with HD till 1/9 to complete 2 weeks following line removal, nephrology is aware  WBC count today is  24 down from 32. Ordered CBC to be checked in am.   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 Dianah (dermatology) for the past 6 years Has not been able to obtain Bimzelx yet holding clobetasol  at this time   AKI now advanced to ESRD Last HD on 1/3   HFpEF NYHA IV Will not recommend SGLT2 in the future given groin wounds Continue to hold amlodipine , and torsemide  for now Resumed coreg  at a lower dose   Hypothyroidism Continue Synthroid  125   Recent TBI brain managed nonsurgically during hospitalization 12/16-12/9. 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 Victory Boers 12/26 neurosurgery --absence of mental status changes or focal deficit --nothing surgically specific to do in this case   Scrotal wound in the setting of Fournier's gangrene as above Last seen [facility] surgery 10/13/2024 WOC following   Class II Obesity,POA: Recommended diet/lifestyle modification. Likely a candidate for GLP-1 Agonist therapy.   Hyponatremia - improved with HD, continue fluid restriction    Constipation - continue bowel regimen with miralax , senokot, PRN lactulose    Disposition: CIR    DVT prophylaxis: Place and maintain sequential compression device Start: 11/11/24 1752     Code Status: Full Code Family Communication:  None at the bedside Status is: Inpatient Remains inpatient appropriate  because: Pending CIR placement    Subjective:  No acute events ovenight. He was on nasal CPAP. He had HD yesterday. He doesn't remember whether he worked with PT yesterday or not. We spoke about change in her antibiotic and down trending WBC count.  Examination:  General exam: Appears calm and comfortable  Respiratory system: Clear to auscultation. Respiratory effort  normal. Cardiovascular system: S1 & S2 heard, RRR. No JVD, murmurs, rubs, gallops or clicks. No pedal edema. Gastrointestinal system: Abdomen is nondistended, soft and nontender. No organomegaly or masses felt. Normal bowel sounds heard. Central nervous system: Alert and oriented. No focal neurological deficits. Extremities: Symmetric 5 x 5 power. Skin: No rashes, lesions or ulcers Psychiatry: Judgement and insight appear normal. Mood & affect appropriate.     Diet Orders (From admission, onward)     Start     Ordered   11/17/24 1620  Diet Carb Modified Room service appropriate? Yes; Fluid restriction: 1500 mL Fluid  Diet effective now       Question Answer Comment  Diet-HS Snack? Nothing   Calorie Level Medium 1600-2000   Fluid consistency: Thin   Room service appropriate? Yes   Fluid restriction: 1500 mL Fluid      11/17/24 1619            Objective: Vitals:   11/21/24 2033 11/22/24 0405 11/22/24 0515 11/22/24 0737  BP: 107/66  137/69 (!) 145/66  Pulse: 74  63 67  Resp: 16  19   Temp: 98.4 F (36.9 C)  97.9 F (36.6 C) 98.5 F (36.9 C)  TempSrc: Oral  Oral Oral  SpO2: 100%  99% 99%  Weight:  121.6 kg    Height:        Intake/Output Summary (Last 24 hours) at 11/22/2024 0955 Last data filed at 11/21/2024 1904 Gross per 24 hour  Intake --  Output 1300 ml  Net -1300 ml   Filed Weights   11/21/24 1513 11/21/24 1904 11/22/24 0405  Weight: 122.7 kg 121.6 kg 121.6 kg    Scheduled Meds:  calcitRIOL   0.25 mcg Oral Daily   carvedilol   6.25 mg Oral BID WC   Chlorhexidine  Gluconate Cloth  6 each Topical Q0600   [START ON 11/24/2024] darbepoetin (ARANESP ) injection - DIALYSIS  100 mcg Subcutaneous Q Tue-1800   insulin  aspart  0-5 Units Subcutaneous QHS   insulin  aspart  0-6 Units Subcutaneous TID WC   insulin  aspart  5 Units Subcutaneous TID WC   insulin  glargine  20 Units Subcutaneous Daily   levofloxacin   500 mg Oral Q48H   levothyroxine   125 mcg Oral q AM    polyethylene glycol  17 g Oral BID   senna-docusate  1 tablet Oral BID   simvastatin   20 mg Oral QPM   sodium chloride  flush  10-40 mL Intracatheter Q12H   sodium chloride  flush  3 mL Intravenous Q12H   Continuous Infusions:  [START ON 11/24/2024]  ceFAZolin  (ANCEF ) IV      Nutritional status     Body mass index is 34.42 kg/m.  Data Reviewed:   CBC: Recent Labs  Lab 11/17/24 0246 11/18/24 1019 11/20/24 0203 11/21/24 1510 11/22/24 0600  WBC 17.6* 18.0* 22.5* 32.8* 24.1*  NEUTROABS  --  12.9*  --   --  19.3*  HGB 9.1* 9.6* 8.9* 8.9* 8.4*  HCT 25.9* 26.9* 25.8* 26.4* 25.0*  MCV 79.9* 78.7* 80.9 83.8 82.8  PLT 93* 125* 216 273 241   Basic Metabolic Panel: Recent Labs  Lab 11/18/24 1018 11/19/24 1039 11/20/24 0203 11/21/24 0532 11/22/24 0601  NA 129* 130* 132* 133* 131*  K 4.0 4.2 3.6 3.8 4.2  CL 92* 94* 95* 96* 95*  CO2 23 21* 27 27 25   GLUCOSE 240* 272* 260* 169* 332*  BUN 40* 52* 30* 36* 24*  CREATININE 4.09* 5.33* 3.97* 5.42* 4.10*  CALCIUM 8.0* 8.1* 8.0* 8.2* 7.9*  PHOS  --  4.5 3.1 3.8 4.3   GFR: Estimated Creatinine Clearance: 23.6 mL/min (A) (by C-G formula based on SCr of 4.1 mg/dL (H)). Liver Function Tests: Recent Labs  Lab 11/16/24 0355 11/17/24 0246 11/18/24 1018 11/19/24 1039 11/20/24 0203 11/21/24 0532 11/22/24 0600 11/22/24 0601  AST <10* 11* 11*  --   --   --  12*  --   ALT 6 7 6   --   --   --  <5  --   ALKPHOS 131* 122 124  --   --   --  101  --   BILITOT 0.6 0.5 0.6  --   --   --  0.6  --   PROT 6.1* 6.0* 6.3*  --   --   --  6.2*  --   ALBUMIN  2.3* 2.3* 2.4* 2.3* 2.3* 2.3* 2.4* 2.2*   No results for input(s): LIPASE, AMYLASE in the last 168 hours. No results for input(s): AMMONIA in the last 168 hours. Coagulation Profile: No results for input(s): INR, PROTIME in the last 168 hours. Cardiac Enzymes: No results for input(s): CKTOTAL, CKMB, CKMBINDEX, TROPONINI in the last 168 hours. BNP (last 3 results) Recent  Labs    08/28/24 1613  PROBNP 6,038.0*   HbA1C: No results for input(s): HGBA1C in the last 72 hours. CBG: Recent Labs  Lab 11/20/24 2046 11/21/24 0819 11/21/24 1154 11/21/24 2012 11/22/24 0737  GLUCAP 197* 201* 118* 166* 313*   Lipid Profile: No results for input(s): CHOL, HDL, LDLCALC, TRIG, CHOLHDL, LDLDIRECT in the last 72 hours. Thyroid  Function Tests: No results for input(s): TSH, T4TOTAL, FREET4, T3FREE, THYROIDAB in the last 72 hours. Anemia Panel: No results for input(s): VITAMINB12, FOLATE, FERRITIN, TIBC, IRON , RETICCTPCT in the last 72 hours. Sepsis Labs: No results for input(s): PROCALCITON, LATICACIDVEN in the last 168 hours.  Recent Results (from the past 240 hours)  Respiratory (~20 pathogens) panel by PCR     Status: None   Collection Time: 11/12/24 10:28 AM   Specimen: Nasopharyngeal Swab; Respiratory  Result Value Ref Range Status   Adenovirus NOT DETECTED NOT DETECTED Final   Coronavirus 229E NOT DETECTED NOT DETECTED Final    Comment: (NOTE) The Coronavirus on the Respiratory Panel, DOES NOT test for the novel  Coronavirus (2019 nCoV)    Coronavirus HKU1 NOT DETECTED NOT DETECTED Final   Coronavirus NL63 NOT DETECTED NOT DETECTED Final   Coronavirus OC43 NOT DETECTED NOT DETECTED Final   Metapneumovirus NOT DETECTED NOT DETECTED Final   Rhinovirus / Enterovirus NOT DETECTED NOT DETECTED Final   Influenza A NOT DETECTED NOT DETECTED Final   Influenza B NOT DETECTED NOT DETECTED Final   Parainfluenza Virus 1 NOT DETECTED NOT DETECTED Final   Parainfluenza Virus 2 NOT DETECTED NOT DETECTED Final   Parainfluenza Virus 3 NOT DETECTED NOT DETECTED Final   Parainfluenza Virus 4 NOT DETECTED NOT DETECTED Final   Respiratory Syncytial Virus NOT DETECTED NOT DETECTED Final   Bordetella pertussis NOT DETECTED NOT DETECTED Final   Bordetella Parapertussis NOT DETECTED NOT DETECTED Final   Chlamydophila pneumoniae NOT  DETECTED NOT DETECTED Final   Mycoplasma pneumoniae NOT DETECTED NOT DETECTED Final    Comment: Performed at [facility], [address] [place], [place] [address]  Culture, blood (Routine X 2) w Reflex to ID Panel     Status: None   Collection Time: 11/13/24  7:29 AM   Specimen: BLOOD RIGHT HAND  Result Value Ref Range Status   Specimen Description BLOOD RIGHT HAND  Final   Special Requests   Final    BOTTLES DRAWN AEROBIC AND ANAEROBIC Blood Culture results may not be optimal due to an inadequate volume of blood received in culture bottles   Culture   Final    NO GROWTH 5 DAYS Performed at [facility], [address], [place], [place] [address]    Report Status 11/18/2024 FINAL  Final  Culture, blood (Routine X 2) w Reflex to ID Panel     Status: None   Collection Time: 11/13/24  7:29 AM   Specimen: BLOOD LEFT ARM  Result Value Ref Range Status   Specimen Description BLOOD LEFT ARM  Final   Special Requests   Final    BOTTLES DRAWN AEROBIC AND ANAEROBIC Blood Culture results may not be optimal due to an inadequate volume of blood received in culture bottles   Culture   Final    NO GROWTH 5 DAYS Performed at [facility], [address], [place], [place] [address]    Report Status 11/18/2024 FINAL  Final         Radiology Studies: No results found.         LOS: 11 days   Time spent= 35 mins    Deliliah Room, MD [facility]  If 7PM-7AM, please contact night-coverage  11/22/2024, 9:55 AM  "

## 2024-11-23 ENCOUNTER — Other Ambulatory Visit (HOSPITAL_COMMUNITY): Payer: Self-pay

## 2024-11-23 ENCOUNTER — Inpatient Hospital Stay (HOSPITAL_COMMUNITY)
Admission: AD | Admit: 2024-11-23 | Discharge: 2024-12-05 | DRG: 945 | Disposition: A | Discharge status: Home / Self Care | Source: Intra-hospital | Attending: Physical Medicine and Rehabilitation | Admitting: Physical Medicine and Rehabilitation

## 2024-11-23 ENCOUNTER — Other Ambulatory Visit: Payer: Self-pay

## 2024-11-23 DIAGNOSIS — N179 Acute kidney failure, unspecified: Secondary | ICD-10-CM | POA: Diagnosis present

## 2024-11-23 DIAGNOSIS — M545 Low back pain, unspecified: Secondary | ICD-10-CM | POA: Diagnosis present

## 2024-11-23 DIAGNOSIS — G4733 Obstructive sleep apnea (adult) (pediatric): Secondary | ICD-10-CM | POA: Diagnosis not present

## 2024-11-23 DIAGNOSIS — I509 Heart failure, unspecified: Secondary | ICD-10-CM | POA: Diagnosis present

## 2024-11-23 DIAGNOSIS — B964 Proteus (mirabilis) (morganii) as the cause of diseases classified elsewhere: Secondary | ICD-10-CM | POA: Diagnosis present

## 2024-11-23 DIAGNOSIS — Z6834 Body mass index (BMI) 34.0-34.9, adult: Secondary | ICD-10-CM | POA: Diagnosis not present

## 2024-11-23 DIAGNOSIS — Z833 Family history of diabetes mellitus: Secondary | ICD-10-CM

## 2024-11-23 DIAGNOSIS — Z860101 Personal history of adenomatous and serrated colon polyps: Secondary | ICD-10-CM

## 2024-11-23 DIAGNOSIS — E119 Type 2 diabetes mellitus without complications: Secondary | ICD-10-CM

## 2024-11-23 DIAGNOSIS — E1159 Type 2 diabetes mellitus with other circulatory complications: Secondary | ICD-10-CM | POA: Diagnosis present

## 2024-11-23 DIAGNOSIS — I10 Essential (primary) hypertension: Secondary | ICD-10-CM | POA: Diagnosis not present

## 2024-11-23 DIAGNOSIS — I132 Hypertensive heart and chronic kidney disease with heart failure and with stage 5 chronic kidney disease, or end stage renal disease: Secondary | ICD-10-CM | POA: Diagnosis present

## 2024-11-23 DIAGNOSIS — G629 Polyneuropathy, unspecified: Secondary | ICD-10-CM | POA: Diagnosis present

## 2024-11-23 DIAGNOSIS — A419 Sepsis, unspecified organism: Secondary | ICD-10-CM | POA: Diagnosis not present

## 2024-11-23 DIAGNOSIS — S069XAA Unspecified intracranial injury with loss of consciousness status unknown, initial encounter: Principal | ICD-10-CM | POA: Diagnosis present

## 2024-11-23 DIAGNOSIS — Z794 Long term (current) use of insulin: Secondary | ICD-10-CM

## 2024-11-23 DIAGNOSIS — I152 Hypertension secondary to endocrine disorders: Secondary | ICD-10-CM | POA: Diagnosis present

## 2024-11-23 DIAGNOSIS — D573 Sickle-cell trait: Secondary | ICD-10-CM | POA: Diagnosis present

## 2024-11-23 DIAGNOSIS — D72821 Monocytosis (symptomatic): Secondary | ICD-10-CM | POA: Diagnosis present

## 2024-11-23 DIAGNOSIS — S069XAS Unspecified intracranial injury with loss of consciousness status unknown, sequela: Secondary | ICD-10-CM | POA: Diagnosis not present

## 2024-11-23 DIAGNOSIS — S0689AD Other specified intracranial injury with loss of consciousness status unknown, subsequent encounter: Secondary | ICD-10-CM

## 2024-11-23 DIAGNOSIS — N493 Fournier gangrene: Secondary | ICD-10-CM | POA: Diagnosis present

## 2024-11-23 DIAGNOSIS — S069X9D Unspecified intracranial injury with loss of consciousness of unspecified duration, subsequent encounter: Secondary | ICD-10-CM | POA: Diagnosis not present

## 2024-11-23 DIAGNOSIS — Z8546 Personal history of malignant neoplasm of prostate: Secondary | ICD-10-CM

## 2024-11-23 DIAGNOSIS — Z825 Family history of asthma and other chronic lower respiratory diseases: Secondary | ICD-10-CM

## 2024-11-23 DIAGNOSIS — H8110 Benign paroxysmal vertigo, unspecified ear: Secondary | ICD-10-CM | POA: Diagnosis not present

## 2024-11-23 DIAGNOSIS — D638 Anemia in other chronic diseases classified elsewhere: Secondary | ICD-10-CM | POA: Diagnosis present

## 2024-11-23 DIAGNOSIS — Z6835 Body mass index (BMI) 35.0-35.9, adult: Secondary | ICD-10-CM

## 2024-11-23 DIAGNOSIS — S069XAD Unspecified intracranial injury with loss of consciousness status unknown, subsequent encounter: Secondary | ICD-10-CM

## 2024-11-23 DIAGNOSIS — R32 Unspecified urinary incontinence: Secondary | ICD-10-CM | POA: Diagnosis present

## 2024-11-23 DIAGNOSIS — E162 Hypoglycemia, unspecified: Secondary | ICD-10-CM | POA: Diagnosis not present

## 2024-11-23 DIAGNOSIS — Z8249 Family history of ischemic heart disease and other diseases of the circulatory system: Secondary | ICD-10-CM

## 2024-11-23 DIAGNOSIS — R739 Hyperglycemia, unspecified: Secondary | ICD-10-CM | POA: Diagnosis not present

## 2024-11-23 DIAGNOSIS — N2581 Secondary hyperparathyroidism of renal origin: Secondary | ICD-10-CM | POA: Diagnosis present

## 2024-11-23 DIAGNOSIS — Z79890 Hormone replacement therapy (postmenopausal): Secondary | ICD-10-CM

## 2024-11-23 DIAGNOSIS — Z9079 Acquired absence of other genital organ(s): Secondary | ICD-10-CM

## 2024-11-23 DIAGNOSIS — L409 Psoriasis, unspecified: Secondary | ICD-10-CM | POA: Diagnosis present

## 2024-11-23 DIAGNOSIS — D631 Anemia in chronic kidney disease: Secondary | ICD-10-CM | POA: Diagnosis present

## 2024-11-23 DIAGNOSIS — E1140 Type 2 diabetes mellitus with diabetic neuropathy, unspecified: Secondary | ICD-10-CM | POA: Diagnosis present

## 2024-11-23 DIAGNOSIS — E1122 Type 2 diabetes mellitus with diabetic chronic kidney disease: Secondary | ICD-10-CM | POA: Diagnosis present

## 2024-11-23 DIAGNOSIS — N186 End stage renal disease: Secondary | ICD-10-CM | POA: Diagnosis present

## 2024-11-23 DIAGNOSIS — D329 Benign neoplasm of meninges, unspecified: Secondary | ICD-10-CM | POA: Diagnosis present

## 2024-11-23 DIAGNOSIS — Z992 Dependence on renal dialysis: Secondary | ICD-10-CM

## 2024-11-23 DIAGNOSIS — Z79899 Other long term (current) drug therapy: Secondary | ICD-10-CM

## 2024-11-23 DIAGNOSIS — W19XXXD Unspecified fall, subsequent encounter: Secondary | ICD-10-CM | POA: Diagnosis present

## 2024-11-23 DIAGNOSIS — E7800 Pure hypercholesterolemia, unspecified: Secondary | ICD-10-CM | POA: Diagnosis present

## 2024-11-23 DIAGNOSIS — S069X0D Unspecified intracranial injury without loss of consciousness, subsequent encounter: Secondary | ICD-10-CM

## 2024-11-23 DIAGNOSIS — S065XAD Traumatic subdural hemorrhage with loss of consciousness status unknown, subsequent encounter: Principal | ICD-10-CM

## 2024-11-23 DIAGNOSIS — K760 Fatty (change of) liver, not elsewhere classified: Secondary | ICD-10-CM | POA: Diagnosis present

## 2024-11-23 DIAGNOSIS — Z823 Family history of stroke: Secondary | ICD-10-CM

## 2024-11-23 DIAGNOSIS — E039 Hypothyroidism, unspecified: Secondary | ICD-10-CM | POA: Diagnosis present

## 2024-11-23 DIAGNOSIS — H8190 Unspecified disorder of vestibular function, unspecified ear: Secondary | ICD-10-CM | POA: Insufficient documentation

## 2024-11-23 DIAGNOSIS — A4101 Sepsis due to Methicillin susceptible Staphylococcus aureus: Secondary | ICD-10-CM | POA: Diagnosis present

## 2024-11-23 DIAGNOSIS — E871 Hypo-osmolality and hyponatremia: Secondary | ICD-10-CM | POA: Diagnosis not present

## 2024-11-23 DIAGNOSIS — R5381 Other malaise: Principal | ICD-10-CM | POA: Diagnosis present

## 2024-11-23 DIAGNOSIS — K5900 Constipation, unspecified: Secondary | ICD-10-CM | POA: Diagnosis present

## 2024-11-23 DIAGNOSIS — Z9181 History of falling: Secondary | ICD-10-CM

## 2024-11-23 DIAGNOSIS — G44209 Tension-type headache, unspecified, not intractable: Secondary | ICD-10-CM | POA: Diagnosis present

## 2024-11-23 DIAGNOSIS — Z888 Allergy status to other drugs, medicaments and biological substances status: Secondary | ICD-10-CM

## 2024-11-23 DIAGNOSIS — E669 Obesity, unspecified: Secondary | ICD-10-CM | POA: Diagnosis present

## 2024-11-23 DIAGNOSIS — R7881 Bacteremia: Secondary | ICD-10-CM | POA: Diagnosis present

## 2024-11-23 DIAGNOSIS — T148XXA Other injury of unspecified body region, initial encounter: Secondary | ICD-10-CM | POA: Diagnosis present

## 2024-11-23 DIAGNOSIS — Z7985 Long-term (current) use of injectable non-insulin antidiabetic drugs: Secondary | ICD-10-CM

## 2024-11-23 LAB — CBC
HCT: 25.1 % — ABNORMAL LOW (ref 39.0–52.0)
Hemoglobin: 8.3 g/dL — ABNORMAL LOW (ref 13.0–17.0)
MCH: 27.7 pg (ref 26.0–34.0)
MCHC: 33.1 g/dL (ref 30.0–36.0)
MCV: 83.7 fL (ref 80.0–100.0)
Platelets: 233 K/uL (ref 150–400)
RBC: 3 MIL/uL — ABNORMAL LOW (ref 4.22–5.81)
RDW: 17.2 % — ABNORMAL HIGH (ref 11.5–15.5)
WBC: 21.5 K/uL — ABNORMAL HIGH (ref 4.0–10.5)
nRBC: 0 % (ref 0.0–0.2)

## 2024-11-23 LAB — RENAL FUNCTION PANEL
Albumin: 2.2 g/dL — ABNORMAL LOW (ref 3.5–5.0)
Albumin: 2.4 g/dL — ABNORMAL LOW (ref 3.5–5.0)
Anion gap: 11 (ref 5–15)
Anion gap: 10 (ref 5–15)
BUN: 29 mg/dL — ABNORMAL HIGH (ref 8–23)
BUN: 32 mg/dL — ABNORMAL HIGH (ref 8–23)
CO2: 25 mmol/L (ref 22–32)
CO2: 27 mmol/L (ref 22–32)
Calcium: 8.2 mg/dL — ABNORMAL LOW (ref 8.9–10.3)
Calcium: 8.2 mg/dL — ABNORMAL LOW (ref 8.9–10.3)
Chloride: 94 mmol/L — ABNORMAL LOW (ref 98–111)
Chloride: 94 mmol/L — ABNORMAL LOW (ref 98–111)
Creatinine, Ser: 5.2 mg/dL — ABNORMAL HIGH (ref 0.61–1.24)
Creatinine, Ser: 5.91 mg/dL — ABNORMAL HIGH (ref 0.61–1.24)
GFR, Estimated: 11 mL/min — ABNORMAL LOW
GFR, Estimated: 10 mL/min — ABNORMAL LOW
Glucose, Bld: 175 mg/dL — ABNORMAL HIGH (ref 70–99)
Glucose, Bld: 179 mg/dL — ABNORMAL HIGH (ref 70–99)
Phosphorus: 4.4 mg/dL (ref 2.5–4.6)
Phosphorus: 4.7 mg/dL — ABNORMAL HIGH (ref 2.5–4.6)
Potassium: 4 mmol/L (ref 3.5–5.1)
Potassium: 3.9 mmol/L (ref 3.5–5.1)
Sodium: 130 mmol/L — ABNORMAL LOW (ref 135–145)
Sodium: 131 mmol/L — ABNORMAL LOW (ref 135–145)

## 2024-11-23 LAB — CBC WITH DIFFERENTIAL/PLATELET
Abs Immature Granulocytes: 0.33 K/uL — ABNORMAL HIGH (ref 0.00–0.07)
Basophils Absolute: 0.1 K/uL (ref 0.0–0.1)
Basophils Relative: 1 %
Eosinophils Absolute: 0.4 K/uL (ref 0.0–0.5)
Eosinophils Relative: 2 %
HCT: 24.5 % — ABNORMAL LOW (ref 39.0–52.0)
Hemoglobin: 8.1 g/dL — ABNORMAL LOW (ref 13.0–17.0)
Immature Granulocytes: 2 %
Lymphocytes Relative: 5 %
Lymphs Abs: 1.1 K/uL (ref 0.7–4.0)
MCH: 27.6 pg (ref 26.0–34.0)
MCHC: 33.1 g/dL (ref 30.0–36.0)
MCV: 83.6 fL (ref 80.0–100.0)
Monocytes Absolute: 0.9 K/uL (ref 0.1–1.0)
Monocytes Relative: 4 %
Neutro Abs: 19 K/uL — ABNORMAL HIGH (ref 1.7–7.7)
Neutrophils Relative %: 86 %
Platelets: 220 K/uL (ref 150–400)
RBC: 2.93 MIL/uL — ABNORMAL LOW (ref 4.22–5.81)
RDW: 16.9 % — ABNORMAL HIGH (ref 11.5–15.5)
WBC: 21.8 K/uL — ABNORMAL HIGH (ref 4.0–10.5)
nRBC: 0 % (ref 0.0–0.2)

## 2024-11-23 LAB — GLUCOSE, CAPILLARY
Glucose-Capillary: 193 mg/dL — ABNORMAL HIGH (ref 70–99)
Glucose-Capillary: 179 mg/dL — ABNORMAL HIGH (ref 70–99)
Glucose-Capillary: 173 mg/dL — ABNORMAL HIGH (ref 70–99)
Glucose-Capillary: 174 mg/dL — ABNORMAL HIGH (ref 70–99)

## 2024-11-23 MED ORDER — CHLORHEXIDINE GLUCONATE CLOTH 2 % EX PADS
6.0000 | MEDICATED_PAD | Freq: Two times a day (BID) | CUTANEOUS | Status: DC
  Administered 2024-11-23 – 2024-11-24 (×2): 6 via TOPICAL

## 2024-11-23 MED ORDER — CALCITRIOL 0.25 MCG PO CAPS: 0.2500 ug | ORAL_CAPSULE | Freq: Every day | ORAL | Status: DC

## 2024-11-23 MED ORDER — DEXTROSE 50 % IV SOLN: 0.0000 mL | INTRAVENOUS | Status: DC | PRN

## 2024-11-23 MED ORDER — CARVEDILOL 6.25 MG PO TABS
6.2500 mg | ORAL_TABLET | Freq: Two times a day (BID) | ORAL | Status: DC
  Administered 2024-11-23 – 2024-12-05 (×23): 6.25 mg via ORAL
  Filled 2024-11-23 (×23): qty 1

## 2024-11-23 MED ORDER — BISACODYL 10 MG RE SUPP: 10.0000 mg | Freq: Every day | RECTAL | Status: DC | PRN

## 2024-11-23 MED ORDER — INSULIN GLARGINE-YFGN 100 UNIT/ML ~~LOC~~ SOLN: 25.0000 [IU] | Freq: Every day | SUBCUTANEOUS | Status: AC

## 2024-11-23 MED ORDER — SENNOSIDES-DOCUSATE SODIUM 8.6-50 MG PO TABS
1.0000 | ORAL_TABLET | Freq: Two times a day (BID) | ORAL | Status: DC
  Administered 2024-11-24 – 2024-12-05 (×15): 1 via ORAL
  Filled 2024-11-23 (×22): qty 1

## 2024-11-23 MED ORDER — DIPHENHYDRAMINE HCL 25 MG PO CAPS: 25.0000 mg | ORAL_CAPSULE | Freq: Four times a day (QID) | ORAL | Status: DC | PRN

## 2024-11-23 MED ORDER — LEVOTHYROXINE SODIUM 75 MCG PO TABS
125.0000 ug | ORAL_TABLET | Freq: Every morning | ORAL | Status: DC
  Administered 2024-11-24 – 2024-12-05 (×12): 125 ug via ORAL
  Filled 2024-11-23 (×12): qty 1

## 2024-11-23 MED ORDER — CHLORHEXIDINE GLUCONATE CLOTH 2 % EX PADS: 6.0000 | MEDICATED_PAD | Freq: Every day | CUTANEOUS | Status: DC

## 2024-11-23 MED ORDER — HEPARIN SODIUM (PORCINE) 1000 UNIT/ML DIALYSIS
3500.0000 [IU] | Freq: Once | INTRAMUSCULAR | Status: AC
  Administered 2024-11-24: 3500 [IU] via INTRAVENOUS_CENTRAL
  Filled 2024-11-23: qty 4

## 2024-11-23 MED ORDER — INSULIN ASPART 100 UNIT/ML IJ SOLN
0.0000 [IU] | Freq: Three times a day (TID) | INTRAMUSCULAR | Status: DC
  Administered 2024-11-23: 1 [IU] via SUBCUTANEOUS
  Administered 2024-11-24 (×2): 2 [IU] via SUBCUTANEOUS
  Administered 2024-11-25 (×2): 1 [IU] via SUBCUTANEOUS
  Administered 2024-11-25: 3 [IU] via SUBCUTANEOUS
  Administered 2024-11-26 – 2024-11-27 (×4): 1 [IU] via SUBCUTANEOUS
  Administered 2024-11-27: 4 [IU] via SUBCUTANEOUS
  Administered 2024-11-28: 5 [IU] via SUBCUTANEOUS
  Administered 2024-11-28 – 2024-11-29 (×3): 2 [IU] via SUBCUTANEOUS
  Administered 2024-11-30 – 2024-12-01 (×3): 1 [IU] via SUBCUTANEOUS
  Administered 2024-12-02: 3 [IU] via SUBCUTANEOUS
  Administered 2024-12-03 – 2024-12-05 (×3): 1 [IU] via SUBCUTANEOUS
  Filled 2024-11-23 (×6): qty 1
  Filled 2024-11-23: qty 2
  Filled 2024-11-23 (×3): qty 1
  Filled 2024-11-23: qty 2
  Filled 2024-11-23: qty 4
  Filled 2024-11-23: qty 1
  Filled 2024-11-23: qty 3
  Filled 2024-11-23: qty 2
  Filled 2024-11-23: qty 1
  Filled 2024-11-23: qty 3
  Filled 2024-11-23: qty 2
  Filled 2024-11-23 (×4): qty 1

## 2024-11-23 MED ORDER — LIDOCAINE HCL (PF) 1 % IJ SOLN: 5.0000 mL | INTRAMUSCULAR | Status: DC | PRN

## 2024-11-23 MED ORDER — HEPARIN SODIUM (PORCINE) 1000 UNIT/ML DIALYSIS: 1000.0000 [IU] | INTRAMUSCULAR | Status: DC | PRN

## 2024-11-23 MED ORDER — ANTICOAGULANT SODIUM CITRATE 4% (200MG/5ML) IV SOLN: 5.0000 mL | Status: DC | PRN

## 2024-11-23 MED ORDER — HEPARIN SODIUM (PORCINE) 1000 UNIT/ML DIALYSIS: 20.0000 [IU]/kg | INTRAMUSCULAR | Status: DC | PRN

## 2024-11-23 MED ORDER — MILK AND MOLASSES ENEMA: 1.0000 | Freq: Every day | RECTAL | Status: DC | PRN

## 2024-11-23 MED ORDER — ALTEPLASE 2 MG IJ SOLR: 2.0000 mg | Freq: Once | INTRAMUSCULAR | Status: DC | PRN

## 2024-11-23 MED ORDER — LIDOCAINE-PRILOCAINE 2.5-2.5 % EX CREA: 1.0000 | TOPICAL_CREAM | CUTANEOUS | Status: DC | PRN

## 2024-11-23 MED ORDER — CEFAZOLIN SODIUM-DEXTROSE 2-4 GM/100ML-% IV SOLN: 2.0000 g | INTRAVENOUS | Status: DC

## 2024-11-23 MED ORDER — ALBUTEROL SULFATE (2.5 MG/3ML) 0.083% IN NEBU: 2.5000 mg | INHALATION_SOLUTION | Freq: Four times a day (QID) | RESPIRATORY_TRACT | Status: DC | PRN

## 2024-11-23 MED ORDER — DARBEPOETIN ALFA 100 MCG/0.5ML IJ SOSY: 100.0000 ug | PREFILLED_SYRINGE | INTRAMUSCULAR | Status: DC

## 2024-11-23 MED ORDER — HEPARIN SODIUM (PORCINE) 1000 UNIT/ML DIALYSIS: 1500.0000 [IU] | INTRAMUSCULAR | Status: DC | PRN

## 2024-11-23 MED ORDER — CARVEDILOL 6.25 MG PO TABS: 6.2500 mg | ORAL_TABLET | Freq: Two times a day (BID) | ORAL | Status: DC

## 2024-11-23 MED ORDER — GUAIFENESIN-DM 100-10 MG/5ML PO SYRP: 5.0000 mL | ORAL_SOLUTION | Freq: Four times a day (QID) | ORAL | Status: DC | PRN

## 2024-11-23 MED ORDER — HYDROCERIN EX CREA
TOPICAL_CREAM | Freq: Two times a day (BID) | CUTANEOUS | Status: DC
  Filled 2024-11-23 (×2): qty 113

## 2024-11-23 MED ORDER — SIMVASTATIN 20 MG PO TABS
20.0000 mg | ORAL_TABLET | Freq: Every evening | ORAL | Status: DC
  Administered 2024-11-23 – 2024-12-04 (×12): 20 mg via ORAL
  Filled 2024-11-23 (×12): qty 1

## 2024-11-23 MED ORDER — INSULIN LISPRO 200 UNIT/ML ~~LOC~~ SOPN
5.0000 [IU] | PEN_INJECTOR | Freq: Three times a day (TID) | SUBCUTANEOUS | 0 refills | Status: AC
  Filled 2024-11-23: qty 10, fill #0

## 2024-11-23 MED ORDER — PROCHLORPERAZINE MALEATE 5 MG PO TABS: 5.0000 mg | ORAL_TABLET | Freq: Four times a day (QID) | ORAL | Status: DC | PRN

## 2024-11-23 MED ORDER — DARBEPOETIN ALFA 100 MCG/0.5ML IJ SOSY
100.0000 ug | PREFILLED_SYRINGE | INTRAMUSCULAR | Status: DC
  Administered 2024-11-24 – 2024-12-01 (×2): 100 ug via SUBCUTANEOUS
  Filled 2024-11-23 (×2): qty 0.5

## 2024-11-23 MED ORDER — LEVOFLOXACIN 500 MG PO TABS: 500.0000 mg | ORAL_TABLET | ORAL | Status: DC

## 2024-11-23 MED ORDER — CALCITRIOL 0.25 MCG PO CAPS
0.2500 ug | ORAL_CAPSULE | Freq: Every day | ORAL | Status: DC
  Administered 2024-11-24 – 2024-12-05 (×12): 0.25 ug via ORAL
  Filled 2024-11-23 (×12): qty 1

## 2024-11-23 MED ORDER — LACTULOSE 10 GM/15ML PO SOLN
20.0000 g | Freq: Two times a day (BID) | ORAL | Status: DC | PRN
  Administered 2024-11-28: 20 g via ORAL
  Filled 2024-11-23: qty 30

## 2024-11-23 MED ORDER — PENTAFLUOROPROP-TETRAFLUOROETH EX AERO: 1.0000 | INHALATION_SPRAY | CUTANEOUS | Status: DC | PRN

## 2024-11-23 MED ORDER — PROCHLORPERAZINE 25 MG RE SUPP: 12.5000 mg | Freq: Four times a day (QID) | RECTAL | Status: DC | PRN

## 2024-11-23 MED ORDER — HEPARIN SODIUM (PORCINE) 5000 UNIT/ML IJ SOLN
5000.0000 [IU] | Freq: Three times a day (TID) | INTRAMUSCULAR | Status: DC
  Administered 2024-11-23 – 2024-12-05 (×31): 5000 [IU] via SUBCUTANEOUS
  Filled 2024-11-23 (×32): qty 1

## 2024-11-23 MED ORDER — DORZOLAMIDE HCL 2 % OP SOLN
1.0000 [drp] | Freq: Every day | OPHTHALMIC | Status: DC
  Administered 2024-11-23 – 2024-12-05 (×13): 1 [drp] via OPHTHALMIC
  Filled 2024-11-23: qty 10

## 2024-11-23 MED ORDER — FAMOTIDINE 20 MG PO TABS: 10.0000 mg | ORAL_TABLET | Freq: Two times a day (BID) | ORAL | Status: DC | PRN

## 2024-11-23 MED ORDER — PROCHLORPERAZINE EDISYLATE 10 MG/2ML IJ SOLN: 5.0000 mg | Freq: Four times a day (QID) | INTRAMUSCULAR | Status: DC | PRN

## 2024-11-23 MED ORDER — ACETAMINOPHEN 325 MG PO TABS
325.0000 mg | ORAL_TABLET | ORAL | Status: DC | PRN
  Administered 2024-11-24 – 2024-11-28 (×3): 650 mg via ORAL
  Administered 2024-11-29: 500 mg via ORAL
  Administered 2024-11-30 – 2024-12-04 (×4): 650 mg via ORAL
  Filled 2024-11-23 (×8): qty 2

## 2024-11-23 MED ORDER — INSULIN ASPART 100 UNIT/ML IJ SOLN
0.0000 [IU] | Freq: Every day | INTRAMUSCULAR | Status: DC
  Administered 2024-11-24 – 2024-11-26 (×2): 2 [IU] via SUBCUTANEOUS
  Administered 2024-11-27: 4 [IU] via SUBCUTANEOUS
  Administered 2024-11-29 – 2024-11-30 (×2): 2 [IU] via SUBCUTANEOUS
  Filled 2024-11-23: qty 4
  Filled 2024-11-23 (×4): qty 2

## 2024-11-23 MED ORDER — CEFAZOLIN SODIUM-DEXTROSE 2-4 GM/100ML-% IV SOLN
2.0000 g | INTRAVENOUS | Status: DC
  Administered 2024-11-24 – 2024-12-02 (×5): 2 g via INTRAVENOUS
  Filled 2024-11-23 (×4): qty 100

## 2024-11-23 MED ORDER — INSULIN ASPART 100 UNIT/ML IJ SOLN
5.0000 [IU] | Freq: Three times a day (TID) | INTRAMUSCULAR | Status: DC
  Administered 2024-11-23 – 2024-11-30 (×17): 5 [IU] via SUBCUTANEOUS
  Filled 2024-11-23 (×16): qty 5

## 2024-11-23 MED ORDER — TIMOLOL MALEATE 0.5 % OP SOLN
1.0000 [drp] | Freq: Every day | OPHTHALMIC | Status: DC
  Administered 2024-11-23 – 2024-12-05 (×13): 1 [drp] via OPHTHALMIC
  Filled 2024-11-23: qty 5

## 2024-11-23 MED ORDER — POLYETHYLENE GLYCOL 3350 17 G PO PACK: 17.0000 g | PACK | Freq: Two times a day (BID) | ORAL | Status: DC

## 2024-11-23 MED ORDER — INSULIN GLARGINE-YFGN 100 UNIT/ML ~~LOC~~ SOLN
25.0000 [IU] | Freq: Every day | SUBCUTANEOUS | Status: DC
  Administered 2024-11-24: 25 [IU] via SUBCUTANEOUS
  Filled 2024-11-23 (×2): qty 0.25

## 2024-11-23 MED ORDER — LEVOFLOXACIN 500 MG PO TABS
500.0000 mg | ORAL_TABLET | ORAL | Status: AC
  Administered 2024-11-24 – 2024-11-28 (×3): 500 mg via ORAL
  Filled 2024-11-23 (×3): qty 1

## 2024-11-23 MED ORDER — TRAZODONE HCL 50 MG PO TABS: 25.0000 mg | ORAL_TABLET | Freq: Every evening | ORAL | Status: DC | PRN

## 2024-11-23 MED ORDER — NEPRO/CARBSTEADY PO LIQD: 237.0000 mL | ORAL | Status: DC | PRN

## 2024-11-23 MED ORDER — INSULIN ASPART 100 UNIT/ML IJ SOLN: 5.0000 [IU] | Freq: Three times a day (TID) | INTRAMUSCULAR | Status: DC

## 2024-11-23 NOTE — Progress Notes (Signed)
 "    Babs Arthea DASEN, MD  Physician Physical Medicine and Rehabilitation   PMR Pre-admission    Signed   Date of Service: 11/23/2024 12:39 PM  Related encounter: ED to Hosp-Admission (Discharged) from 11/11/2024 in [place] [facility]   Signed     Expand All Collapse All  PMR Admission Coordinator Pre-Admission Assessment   Patient: Brendan [name]. is an 69 y.o., male MRN: [id number] DOB: 10-Oct-1956 Height:   Weight: 127.6 kg   Insurance Information HMO: yes    PPO:      PCP:      IPA:      80/20:      OTHER:  PRIMARY: BCBS Medicare      Policy#: [id number]      Subscriber: patient CM Name: Brendan [name]            Phone#: [phone]     Fax#: [phone] Pre-Cert#: [id number] auth for CIR from Brendan [name] with [facility] Medicare for admit 11/23/24 with next review date tbd.  Updates due to Brendan [name] at fax listed above.       Employer:  Benefits:  Phone #: [phone]     Name:  Brendan [name]. Date: 11/19/24-11/18/2198     Deduct: does not have one      Out of Pocket Max: $4,900 ($0 met)      Life Max: NA CIR: $400 co-pay/day for days 1-6, 100% coverage for days 7-90      SNF: 100% coverage for days 1-20, $218 co-pay/day for days 21-60 Outpatient: $15 co-pay/visit     Co-Pay:  Home Health: 100% coverage      Co-Pay:  DME: 80% coverage     Co-Pay: 20% co-insurance Providers: in-network SECONDARY:       Policy#:      Phone#:    Artist:       Phone#:    The Best Boy for patients in Inpatient Rehabilitation Facilities with attached Privacy Act Statement-Health Care Records was provided and verbally reviewed with: Patient and Family   Emergency Contact Information Contact Information       Name Relation Home Work Mobile    [place] Spouse [phone]         Other Contacts   None on File        Current Medical History  Patient Admitting Diagnosis: debility History of Present Illness: Pt is a 69 year old  male with medical hx significant for: ESRD on HD, h/o Fournier's gangrene s/p I&D drainage wound (08/2024)- cultures grew strep constellatus MSSA Prevotella + beta-lactamase positive, OSA, HTN, IDDM, hypothyroid, HFpEF, gout.  Pt readmitted on 10/28 d/t SOB and AKI worsening. Started on iHD s/p tunneled cath (09/22/24). Pt readmitted on 12/16 d/t fall. Imaging revealed contusion with TBI and meningioma. Pt presented to [facility] on 11/11/24 d/t weakness, fever and hypotension d/t sepsis. Nephrology consulted. Recommended removal of tunneled HD catheter.Concern for DKA on 12/25. CT on 12/26, d/t reported fall on 12/23, showed cystic hygromas. Neurosurgery is not recommending surgical intervention. TEE on 12/26 which showed no vegetation. TDC removed on 12/26 and replaced on 12/30 after line holiday.  Tolerating HD TRS.   ID following and recommended cefepime  with EOT date 11/28/23.SABRA  Therapy evaluations completed and CIR recommended d/t pt's deficits in functional mobility.   Complete NIHSS TOTAL: 0   Patient's medical [facility] has been reviewed by the rehabilitation admission coordinator and physician.   Past Medical History  Past Medical History:  Diagnosis Date   Acanthosis nigricans     Atopic dermatitis     CHF (congestive heart failure) (HCC)     CKD (chronic kidney disease) stage 4, GFR 15-29 ml/min (HCC) 01/08/2023   Diabetes (HCC) 11/19/2002   Erectile dysfunction     Fatty liver 11/19/2005   Glaucoma 11/19/2006   Gynecomastia 11/20/2007   Hx of adenomatous colonic polyps 08/26/2023   Hyperaldosteronism 11/19/1998   Hypercholesterolemia     Hyperlipidemia 2010   Hypertension 1999   Hypoglycemic reaction     Hypothyroidism 11/19/2002   Incontinence     Intermittent vertigo 11/19/2010   Left cervical radiculopathy 11/19/2010   Lumbar radiculopathy     Obesity     Peripheral neuropathy     Pollen allergies 11/19/2005    perennial   Prostate  cancer (HCC) 11/19/2004   Reflux esophagitis 11/19/1993   Sickle cell trait 11/19/2004   Sleep apnea, obstructive 11/19/1998    uses a cpap   Venous insufficiency 11/20/2003   Vitamin D deficiency 11/19/2010          Has the patient had major surgery during 100 days prior to admission? Yes   Family History   family history includes COPD in his sister; CVA in his sister; Diabetes in his daughter; Heart attack in his father; Heart failure in his mother; Hypertension in his father.   Current Medications [Current Medications]  [Current Medications]    Current Facility-Administered Medications:    acetaminophen  (TYLENOL ) tablet 650 mg, 650 mg, Oral, Q6H PRN, 650 mg at 11/14/24 1045 **OR** acetaminophen  (TYLENOL ) suppository 650 mg, 650 mg, Rectal, Q6H PRN, Cindy Garnette POUR, MD   albuterol  (PROVENTIL ) (2.5 MG/3ML) 0.083% nebulizer solution 2.5 mg, 2.5 mg, Nebulization, Q6H PRN, Cindy Garnette POUR, MD   calcitRIOL  (ROCALTROL ) capsule 0.25 mcg, 0.25 mcg, Oral, Daily, Cindy Garnette POUR, MD, 0.25 mcg at 11/16/24 1028   ceFEPIme  (MAXIPIME ) 1 g in sodium chloride  0.9 % 100 mL IVPB, 1 g, Intravenous, Q24H, Cindy Garnette POUR, MD, Last Rate: 200 mL/hr at 11/16/24 1033, 1 g at 11/16/24 1033   Chlorhexidine  Gluconate Cloth 2 % PADS 6 each, 6 each, Topical, Daily, Cindy Garnette POUR, MD, 6 each at 11/16/24 0731   Chlorhexidine  Gluconate Cloth 2 % PADS 6 each, 6 each, Topical, Q0600, Cindy Garnette POUR, MD, 6 each at 11/16/24 [id number]   Darbepoetin Alfa  (ARANESP ) injection 100 mcg, 100 mcg, Subcutaneous, Q Sat-1800, Cindy Garnette POUR, MD, 100 mcg at 11/14/24 1659   dextrose  50 % solution 0-50 mL, 0-50 mL, Intravenous, PRN, Cindy Garnette POUR, MD   insulin  aspart (novoLOG ) injection 0-6 Units, 0-6 Units, Subcutaneous, TID WC, Cindy Garnette POUR, MD, 3 Units at 11/16/24 1219   insulin  aspart (novoLOG ) injection 2 Units, 2 Units, Subcutaneous, TID WC, Cindy Garnette POUR, MD, 2 Units at 11/15/24 1716   [START ON 11/17/2024] insulin   glargine (LANTUS ) injection 15 Units, 15 Units, Subcutaneous, Daily, Cindy Garnette POUR, MD   levothyroxine  (SYNTHROID ) tablet 125 mcg, 125 mcg, Oral, q AM, Cindy Garnette POUR, MD, 125 mcg at 11/16/24 9343   ondansetron  (ZOFRAN ) tablet 4 mg, 4 mg, Oral, Q6H PRN **OR** ondansetron  (ZOFRAN ) injection 4 mg, 4 mg, Intravenous, Q6H PRN, Cindy Garnette POUR, MD   polyethylene glycol (MIRALAX  / GLYCOLAX ) packet 17 g, 17 g, Oral, BID, Cindy Garnette POUR, MD, 17 g at 11/16/24 1036   simvastatin  (ZOCOR ) tablet 20 mg, 20 mg, Oral, QPM, Cindy Garnette POUR, MD, 20 mg at 11/15/24 [id number]  sodium chloride  flush (NS) 0.9 % injection 10-40 mL, 10-40 mL, Intracatheter, Q12H, Cindy Garnette POUR, MD, 10 mL at 11/16/24 1033   sodium chloride  flush (NS) 0.9 % injection 10-40 mL, 10-40 mL, Intracatheter, PRN, Cindy Garnette POUR, MD   sodium chloride  flush (NS) 0.9 % injection 3 mL, 3 mL, Intravenous, Q12H, Cindy Garnette POUR, MD, 3 mL at 11/16/24 1033    Patients Current Diet:  Diet Order                  Diet NPO time specified Except for: Sips with Meds  Diet effective midnight             Diet Carb Modified Room service appropriate? Yes  Diet effective now                         Precautions / Restrictions Precautions Precautions: Fall Restrictions Weight Bearing Restrictions Per Provider Order: No    Has the patient had 2 or more falls or a fall with injury in the past year? Yes   Prior Activity Level Community (5-7x/wk): gets out of house ~4 days/week   Prior Functional Level Self Care: Did the patient need help bathing, dressing, using the toilet or eating? Needed some help   Indoor Mobility: Did the patient need assistance with walking from room to room (with or without device)? Independent   Stairs: Did the patient need assistance with internal or external stairs (with or without device)? Independent   Functional Cognition: Did the patient need help planning regular tasks such as shopping or remembering to take  medications? Independent   Patient Information Are you of Hispanic, Latino/a,or Spanish origin?: A. No, not of Hispanic, Latino/a, or Spanish origin What is your race?: B. Black or African American Do you need or want an interpreter to communicate with a doctor or health care staff?: 0. No   Patient's Response To:  Health Literacy and Transportation Is the patient able to respond to health literacy and transportation needs?: Yes Health Literacy - How often do you need to have someone help you when you read instructions, pamphlets, or other written material from your doctor or pharmacy?: Never In the past 12 months, has lack of transportation kept you from medical appointments or from getting medications?: No In the past 12 months, has lack of transportation kept you from meetings, work, or from getting things needed for daily living?: No   Home Assistive Devices / Equipment Home Equipment: Cane - single point, Higher Education Careers Adviser held shower head, Agricultural Consultant (2 wheels), BSC/3in1, Rollator (4 wheels)   Prior Device Use: Indicate devices/aids used by the patient prior to current illness, exacerbation or injury? Walker   Current Functional Level Cognition   Orientation Level: Oriented X4 [employer] Scales of Cognitive Functioning: Purposeful, Appropriate: Stand-by Assistance    Extremity Assessment (includes Sensation/Coordination)   Upper Extremity Assessment: Generalized weakness  Lower Extremity Assessment: Defer to PT evaluation RLE Deficits / Details: MMT score of 4+ quads, 4 anterior tibialis; noted knee weakness/muscular imbalance with noted hyperextension when standing/ambulating LLE Deficits / Details: MMT score of 4+ quads, 4 anterior tibialis     ADLs   Overall ADL's : Needs assistance/impaired Eating/Feeding: Set up, Sitting Grooming: Set up, Sitting Upper Body Bathing: Set up, Sitting Lower Body Bathing: Moderate assistance, Sit to/from stand Upper Body Dressing : Set up,  Sitting Lower Body Dressing: Moderate assistance, Sit to/from stand Toilet Transfer: Moderate assistance, Ambulation, Rolling walker (  2 wheels) Toilet Transfer Details (indicate cue type and reason): ~11ft limited activity tolerance Functional mobility during ADLs: Moderate assistance, Rolling walker (2 wheels) General ADL Comments: Limited by decreased activity tolerance and strength     Mobility   Overal bed mobility: Needs Assistance Bed Mobility: Supine to Sit Supine to sit: Mod assist Sit to supine: Contact guard assist, Used rails General bed mobility comments: Cues for sequencing, mod assist for trunk.     Transfers   Overall transfer level: Needs assistance Equipment used: Rolling walker (2 wheels) Transfers: Sit to/from Stand Sit to Stand: Min assist, From elevated surface General transfer comment: Min assist to rise from elevated bed height. Cues for hand placement.     Ambulation / Gait / Stairs / Wheelchair Mobility   Ambulation/Gait Ambulation/Gait assistance: Editor, Commissioning (Feet): 2 Feet Assistive device: Rolling walker (2 wheels) Gait Pattern/deviations: Step-to pattern, Decreased step length - right, Decreased step length - left, Decreased stride length, Knee flexed in stance - right, Trunk flexed, Wide base of support General Gait Details: pt taking steps along EOB to HOB, declining ambulation in room/away from EOB Gait velocity: reduced Gait velocity interpretation: <1.31 ft/sec, indicative of household ambulator     Posture / Balance Dynamic Sitting Balance Sitting balance - Comments: Sits unsupported EOB with supervision, no LOB, seated up EOB at end of session with lunch tray set up Balance Overall balance assessment: Needs assistance Sitting-balance support: No upper extremity supported, Feet supported Sitting balance-Leahy Scale: Good Sitting balance - Comments: Sits unsupported EOB with supervision, no LOB, seated up EOB at end of session with  lunch tray set up Standing balance support: Bilateral upper extremity supported, During functional activity, Reliant on assistive device for balance Standing balance-Leahy Scale: Poor Standing balance comment: Reliant on RW     Special considerations/life events  Skin Scratch marks: arm, back, buttocks/bilateral; Surgical Incision: perineum and Diabetic management Novolog  0-6 units 3x daily with meals; Lantus  9 units daily; Novolog  2 units 3x daily with meals    Previous Home Environment (from acute therapy documentation) Living Arrangements: Spouse/significant other Available Help at Discharge: Family, Available PRN/intermittently Type of Home: House Home Layout: One level Home Access: Stairs to enter Entrance Stairs-Rails: Right, Left, Can reach both Entrance Stairs-Number of Steps: 4 Bathroom Shower/Tub: Health Visitor: Handicapped height (has BSC) Bathroom Accessibility: Yes How Accessible: Accessible via walker Home Care Services: Yes Type of Home Care Services: Home OT, Home PT, Home RN Home Care Agency (if known): [facility] Additional Comments: Pt lives with wife who works during the day. Wife takes him to HD before going to work and then picks him up during her lunch hour and takes him home.   Discharge Living Setting Plans for Discharge Living Setting: Patient's home Type of Home at Discharge: House Discharge Home Layout: One level Discharge Home Access: Stairs to enter Entrance Stairs-Rails: Can reach both Entrance Stairs-Number of Steps: 4 Discharge Bathroom Shower/Tub: Walk-in shower Discharge Bathroom Toilet: Handicapped height (has [facility]) Discharge Bathroom Accessibility: Yes How Accessible: Accessible via walker Does the patient have any problems obtaining your medications?: No   Social/Family/Support Systems Anticipated Caregiver: Zyere Jiminez (wife), kids and in-laws Anticipated Caregiver's Contact Information: Robin: [phone] Caregiver  Availability: 24/7 Discharge Plan Discussed with Primary Caregiver: Yes Is Caregiver In Agreement with Plan?: Yes Does Caregiver/Family have Issues with Lodging/Transportation while Pt is in Rehab?: No   Goals Patient/Family Goal for Rehab: Mod I-Supervision: PT/OT/ST Expected length of stay: 10-12 days Pt/Family Agrees  to Admission and willing to participate: Yes Program Orientation Provided & Reviewed with Pt/Caregiver Including Roles  & Responsibilities: Yes   Decrease burden of Care through IP rehab admission: NA   Possible need for SNF placement upon discharge: Not anticipated   Patient Condition: This patient's medical and functional status has changed since the consult dated: 11/16/24 in which the Rehabilitation Physician determined and documented that the patient's condition is appropriate for intensive rehabilitative care in an inpatient rehabilitation facility. See History of Present Illness (above) for medical update. Functional changes are: min assist with mobility/ADLs. Patient's medical and functional status update has been discussed with the Rehabilitation physician and patient remains appropriate for inpatient rehabilitation. Will admit to inpatient rehab today.   Preadmission Screen Completed By:  Tinnie SHAUNNA Yvone Delayne, updates by Reche Lowers, PT, DPT 11/16/2024 3:32 PM ______________________________________________________________________   Discussed status with Dr. Babs on 11/23/2024  at 12:38 PM  and received approval for admission today.   Admission Coordinator:  Tinnie SHAUNNA Yvone Delayne, CCC-SLP, time 12:38 PM /Date 11/23/2024     Assessment/Plan: Diagnosis: debility, TBI Does the need for close, 24 hr/day Medical supervision in concert with the patient's rehab needs make it unreasonable for this patient to be served in a less intensive setting? Yes Co-Morbidities requiring supervision/potential complications: DKA, ESRD, ID considerations Due to bladder management,  bowel management, safety, skin/wound care, disease management, medication administration, pain management, and patient education, does the patient require 24 hr/day rehab nursing? Yes Does the patient require coordinated care of a physician, rehab nurse, PT, OT, and SLP to address physical and functional deficits in the context of the above medical diagnosis(es)? Yes Addressing deficits in the following areas: balance, endurance, locomotion, strength, transferring, bowel/bladder control, bathing, dressing, feeding, grooming, toileting, cognition, and psychosocial support Can the patient actively participate in an intensive therapy program of at least 3 hrs of therapy 5 days a week? Yes The potential for patient to make measurable gains while on inpatient rehab is excellent Anticipated functional outcomes upon discharge from inpatient rehab: modified independent and supervision PT, modified independent and supervision OT, modified independent and supervision SLP Estimated rehab length of stay to reach the above functional goals is: 10-12 days Anticipated discharge destination: Home 10. Overall Rehab/Functional Prognosis: excellent     MD Signature: Arthea IVAR Babs, MD, [facility] Physical Medicine & Rehabilitation Medical Director Rehabilitation Services 11/23/2024              Revision History  Date/Time User Provider Type Action  11/23/2024  1:08 PM Babs Arthea DASEN, MD Physician Sign  11/23/2024 12:39 PM Lowers Reche BRAVO, PT Rehab Admission Coordinator Share  11/20/2024  4:05 PM Yvone Delayne Tinnie SHAUNNA, CCC-SLP Rehab Admission Coordinator Share  11/16/2024  3:32 PM Yvone Delayne Tinnie SHAUNNA, CCC-SLP Rehab Admission Coordinator Share  11/16/2024  2:33 PM Yvone Delayne Tinnie SHAUNNA, CCC-SLP Rehab Admission Coordinator Share   View Details Report        "

## 2024-11-23 NOTE — Progress Notes (Signed)
 [facility] NEPHROLOGY PROGRESS NOTE    Subjective:  Seen in room Going to CIR today   Presentation summary: 69 y.o. year-old w/ PMH as below who presented to ED on 12/16 brought by EMS from home after his wife found him unconscious on the floor with a large hematoma in the back of the head.  Also was weak on his right side.  In the ED he started vomiting.  He did go to dialysis yesterday morning and was seen normal back home at 2:30 PM.  In ED BP was 150/80, HR 78, RR 21, temp 97, 98% sat on room air.  Labs showed K+ 3.6, BUN 14, creatinine 2.4, calcium 8.6, albumin  3.5 Hgb 11, WBC 17K.  UA showed small hemoglobin and LE, rare bacteria, 11-20 WBCs.  Urine tox screen was negative, alcohol less than 15.  Head CT was concerning for mild contusions along the inferior aspect of the both frontal lobes and the right frontal convexity most c/w meningioma.  Neurosurgery and neurology were consulted and patient was given IV Keppra  load, Benadryl , Zofran , and Reglan  IV.  Patient was admitted for fall with TBI, questionable CVA.  Patient was admitted.  We are asked to see for renal failure.  Objective Vitals:   11/21/24 0500 11/21/24 0536 11/21/24 0828 11/21/24 1154  BP:  134/64 (!) 140/69 (!) 162/68  Pulse:  66 66 74  Resp:      Temp:  98.6 F (37 C) 98 F (36.7 C)   TempSrc:      SpO2:  93% 95% 97%  Weight: 121.9 kg     Height:        Physical Exam:       General adult male, no distress Neck supple trachea midline Lungs clear to auscultation  Heart S1S2 no rub Abdomen soft nontender obese habitus Ext no pitting edema Neuro alert and oriented x 3, follows commands   L internal jugular TDC intact   Outpatient Dialysis Orders:  [facility] - [facility] 3h  B400  125kg   3K bath  TDC  Hep 5000 Mircera 150mcg IV q 2 weeks (100mcg given 12/5) Calcitriol  0.5mcg PO q HD   Assessment/ Plan: Pt is a 69 y.o. yo male with AKI on HD, HTN, T2DM, recent admit for head contusion s/p fall  who is being admitted with fever, leukocytosis, and weakness due to sepsis.  He has progressed to ESRD   # ESRD [facility] (note that AKI has progressed to ESRD) - he has been at [facility] - line holiday => tunneled catheter removed 12/26 and replaced 12/30 - HD per TTS schedule here - next HD 1/06   # HTN/volume - 4kg under dry wt today, bp's stable - prob lower dry wt upon dc - continue fluid restriction of 1.5 liters/day   # Anemia of CKD - Continue Aranesp  100 mcg every Sat and monitor hemoglobin  # Secondary hyperparathyroidism - phos controlled, not on binders.  on calcitriol .  # Hyponatremia  - due to vol overload - resolved   # Sepsis due to MSSA and Proteus bacteremia - suspected TDC infection - TDC removed by IR 12/26 - s/p TEE (negative) on 12/31 - f/u cx's 12/26 are negative - Per ID -> cefepime  changed to IV cefazolin  three times per week w/ dialysis. Plan 4 weeks total for mssa coverage thru 12/11/24 (=4 wks from line removal). For proteus will get another week of levofloxacin  at renal dosing.   # Dispo - going  to CIR today   Myer Fret  MD  [facility] 11/23/2024, 1:04 PM  Recent Labs  Lab 11/22/24 0600 11/22/24 0601 11/23/24 0431  HGB 8.4*  --  8.1*  ALBUMIN  2.4* 2.2* 2.2*  CALCIUM  --  7.9* 8.2*  PHOS  --  4.3 4.4  CREATININE  --  4.10* 5.20*  K  --  4.2 4.0    Inpatient medications:  calcitRIOL   0.25 mcg Oral Daily   carvedilol   6.25 mg Oral BID WC   Chlorhexidine  Gluconate Cloth  6 each Topical Q0600   [START ON 11/24/2024] darbepoetin (ARANESP ) injection - DIALYSIS  100 mcg Subcutaneous Q Tue-1800   insulin  aspart  0-5 Units Subcutaneous QHS   insulin  aspart  0-6 Units Subcutaneous TID WC   insulin  aspart  5 Units Subcutaneous TID WC   insulin  glargine-yfgn  25 Units Subcutaneous Daily   levofloxacin   500 mg Oral Q48H   levothyroxine   125 mcg Oral q AM   polyethylene glycol  17 g Oral BID   senna-docusate  1 tablet Oral BID   simvastatin   20 mg Oral  QPM   sodium chloride  flush  10-40 mL Intracatheter Q12H   sodium chloride  flush  3 mL Intravenous Q12H    [START ON 11/24/2024]  ceFAZolin  (ANCEF ) IV     acetaminophen  **OR** acetaminophen , albuterol , dextrose , lactulose , ondansetron  **OR** ondansetron  (ZOFRAN ) IV, sodium chloride  flush

## 2024-11-23 NOTE — Progress Notes (Signed)
 " PROGRESS NOTE    Brendan [name].  FMW:[id number] DOB: August 02, 1956 DOA: 11/11/2024 PCP: Rexanne Ingle, MD   Brief Narrative:   69 year old male with PMH of DM2, HTN, HLD, poor thyroidism, OSA on CPAP, Fournier's gangrene (October 2025), CKD stage V recently started on HD, recent admission for fall with TBI (12/16-12/18/25) who presented to the ED with fatigue. He was found to be febrile with leukocytosis and was managed for sepsis with unclear etiology. On 12/25, there was concern for DKA and he was managed in the progressive unit. 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 His blood cultures grew MSSA and proteus, for which ID was consulted. Potential sources included HD line and psoraitic rash on his palms. His HD line was removed on 12/26, and replaced on 12/30 after a line holiday. He underwent TEE on 12/31, which was negative for endocarditis.    Pending CIR placement.  Assessment & Plan:  Principal Problem:   Sepsis (HCC) Active Problems:   Insulin -requiring or dependent type II diabetes mellitus (HCC)   Hypotension   History of traumatic brain injury   ESRD on dialysis (HCC)   Heart failure with preserved ejection fraction (HCC)   Open wound   Hypothyroidism   Anemia of chronic disease   Hyperlipidemia associated with type 2 diabetes mellitus (HCC)   OSA (obstructive sleep apnea)   Fever, unknown origin   MSSA bacteremia   Bacteremia   Psoriasis   Bloodstream infection due to central venous catheter   Sepsis secondary to MSSA and proteus mirabilis bacteremia related to HD line: Improving HD cath removed by IR on 12/26 and replaced on 12/30 after a line holiday S/p TEE with no evidence of endocarditis Still with leukocytosis but no other focal symptoms Seen by ID, plan for cefazolin  (changed from Cefepime  on 1/3 by ID) with HD till 1/9 to complete 2 weeks following line removal, nephrology is aware  WBC count today is  21.8 down from 24. Ordered CBC to be checked in am.   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 Dianah (dermatology) for the past 6 years Has not been able to obtain Bimzelx yet holding clobetasol  at this time   AKI now advanced to ESRD Last HD on 1/3. Next HD on 1/6   HFpEF, chronic, POA: NYHA IV Will not recommend SGLT2 in the future given groin wounds Continue to hold amlodipine , and torsemide  for now Resumed coreg  at a lower dose   Hypothyroidism Continue Synthroid  125   Recent TBI brain managed nonsurgically during hospitalization 12/16-12/9. 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 Victory Boers 12/26 neurosurgery --absence of mental status changes or focal deficit --nothing surgically specific to do in this case   Scrotal wound in the setting of Fournier's gangrene as above Last seen [facility] surgery 10/13/2024 WOC following   Class II Obesity,POA: Recommended diet/lifestyle modification. Likely a candidate for GLP-1 Agonist therapy.   Hyponatremia - improved with HD, continue fluid restriction    Constipation - continue bowel regimen with miralax , senokot, PRN lactulose    Disposition: CIR    DVT prophylaxis: Place and maintain sequential compression device Start: 11/11/24 1752     Code Status: Full Code Family Communication:  None at the bedside  Status is: Inpatient Remains inpatient appropriate because: Pending CIR placement    Subjective:  No acute events ovenight. Denied any active complaints.  Examination:  General exam: Appears calm and comfortable  Respiratory system: Clear to auscultation. Respiratory effort normal. Cardiovascular system: S1 & S2 heard, RRR. No JVD, murmurs, rubs, gallops or clicks. No pedal  edema. Gastrointestinal system: Abdomen is nondistended, soft and nontender. No organomegaly or masses felt. Normal bowel sounds heard. Central nervous system: Alert and oriented. No focal neurological deficits. Extremities: Symmetric 5 x 5 power. Skin: No rashes, lesions or ulcers Psychiatry: Judgement and insight appear normal. Mood & affect appropriate.     Diet Orders (From admission, onward)     Start     Ordered   11/17/24 1620  Diet Carb Modified Room service appropriate? Yes; Fluid restriction: 1500 mL Fluid  Diet effective now       Question Answer Comment  Diet-HS Snack? Nothing   Calorie Level Medium 1600-2000   Fluid consistency: Thin   Room service appropriate? Yes   Fluid restriction: 1500 mL Fluid      11/17/24 1619            Objective: Vitals:   11/22/24 2014 11/23/24 0004 11/23/24 0355 11/23/24 0807  BP: 134/78 129/72 (!) 142/63 139/61  Pulse: 68 65 65 67  Resp: 20 20 20 20   Temp: 98.8 F (37.1 C) 98.7 F (37.1 C) 99.8 F (37.7 C) 98.2 F (36.8 C)  TempSrc: Oral Oral Oral Oral  SpO2: 96% 100% 100% 98%  Weight:   125 kg   Height:       No intake or output data in the 24 hours ending 11/23/24 0915  Filed Weights   11/21/24 1904 11/22/24 0405 11/23/24 0355  Weight: 121.6 kg 121.6 kg 125 kg    Scheduled Meds:  calcitRIOL   0.25 mcg Oral Daily   carvedilol   6.25 mg Oral BID WC   Chlorhexidine  Gluconate Cloth  6 each Topical Q0600   [START ON 11/24/2024] darbepoetin (ARANESP ) injection - DIALYSIS  100 mcg Subcutaneous Q Tue-1800   insulin  aspart  0-5 Units Subcutaneous QHS   insulin  aspart  0-6 Units Subcutaneous TID WC   insulin  aspart  5 Units Subcutaneous TID WC   insulin  glargine-yfgn  25 Units Subcutaneous Daily   levofloxacin   500 mg Oral Q48H   levothyroxine   125 mcg Oral q AM   polyethylene glycol  17 g Oral BID   senna-docusate  1 tablet Oral BID   simvastatin   20 mg Oral QPM   sodium chloride  flush  10-40 mL Intracatheter Q12H    sodium chloride  flush  3 mL Intravenous Q12H   Continuous Infusions:  [START ON 11/24/2024]  ceFAZolin  (ANCEF ) IV      Nutritional status     Body mass index is 35.38 kg/m.  Data Reviewed:   CBC: Recent Labs  Lab 11/18/24 1019 11/20/24 0203 11/21/24 1510 11/22/24 0600 11/23/24 0431  WBC 18.0* 22.5* 32.8* 24.1* 21.8*  NEUTROABS 12.9*  --   --  19.3* 19.0*  HGB 9.6* 8.9* 8.9* 8.4* 8.1*  HCT 26.9* 25.8* 26.4* 25.0* 24.5*  MCV 78.7* 80.9 83.8 82.8 83.6  PLT 125* 216 273 241 220   Basic Metabolic Panel: Recent Labs  Lab 11/19/24 1039 11/20/24 0203 11/21/24 0532 11/22/24 0601 11/23/24 0431  NA 130* 132* 133* 131* 130*  K 4.2 3.6 3.8 4.2 4.0  CL 94* 95* 96* 95* 94*  CO2 21* 27 27 25  25  GLUCOSE 272* 260* 169* 332* 175*  BUN 52* 30* 36* 24* 29*  CREATININE 5.33* 3.97* 5.42* 4.10* 5.20*  CALCIUM 8.1* 8.0* 8.2* 7.9* 8.2*  PHOS 4.5 3.1 3.8 4.3 4.4   GFR: Estimated Creatinine Clearance: 18.8 mL/min (A) (by C-G formula based on SCr of 5.2 mg/dL (H)). Liver Function Tests: Recent Labs  Lab 11/17/24 0246 11/18/24 1018 11/19/24 1039 11/20/24 0203 11/21/24 0532 11/22/24 0600 11/22/24 0601 11/23/24 0431  AST 11* 11*  --   --   --  12*  --   --   ALT 7 6  --   --   --  <5  --   --   ALKPHOS 122 124  --   --   --  101  --   --   BILITOT 0.5 0.6  --   --   --  0.6  --   --   PROT 6.0* 6.3*  --   --   --  6.2*  --   --   ALBUMIN  2.3* 2.4*   < > 2.3* 2.3* 2.4* 2.2* 2.2*   < > = values in this interval not displayed.   No results for input(s): LIPASE, AMYLASE in the last 168 hours. No results for input(s): AMMONIA in the last 168 hours. Coagulation Profile: No results for input(s): INR, PROTIME in the last 168 hours. Cardiac Enzymes: No results for input(s): CKTOTAL, CKMB, CKMBINDEX, TROPONINI in the last 168 hours. BNP (last 3 results) Recent Labs    08/28/24 1613  PROBNP 6,038.0*   HbA1C: No results for input(s): HGBA1C in the last 72  hours. CBG: Recent Labs  Lab 11/22/24 0737 11/22/24 1155 11/22/24 1539 11/22/24 2013 11/23/24 0820  GLUCAP 313* 230* 225* 240* 193*   Lipid Profile: No results for input(s): CHOL, HDL, LDLCALC, TRIG, CHOLHDL, LDLDIRECT in the last 72 hours. Thyroid  Function Tests: No results for input(s): TSH, T4TOTAL, FREET4, T3FREE, THYROIDAB in the last 72 hours. Anemia Panel: No results for input(s): VITAMINB12, FOLATE, FERRITIN, TIBC, IRON , RETICCTPCT in the last 72 hours. Sepsis Labs: No results for input(s): PROCALCITON, LATICACIDVEN in the last 168 hours.  No results found for this or any previous visit (from the past 240 hours).        Radiology Studies: No results found.         LOS: 12 days   Time spent= 35 mins    Deliliah Room, MD [facility]  If 7PM-7AM, please contact night-coverage  11/23/2024, 9:15 AM  "

## 2024-11-23 NOTE — Progress Notes (Signed)
 Contacted [facility] to inform of pt being in CIR at this time.   Due to pt being newly declared ESRD and needing rehab, clinic does not think he is appropriate anymore for TCU at this time, they are recommending normal 3x week schedule.   Contacted wife at this time and informed of this, wife stated she works, but will do her best to take him to and from. Wife states that he enjoys the clinic he is at, and they only want this clinic. Prefers a TTS 1st shift if possible. Have informed [facility] [employer] of this, awaiting response.   Lavanda Abygail [facility] navigator [phone]

## 2024-11-23 NOTE — Progress Notes (Signed)
 Inpatient Rehabilitation Admission Medication Review by a Pharmacist  A complete drug regimen review was completed for this patient to identify any potential clinically significant medication issues.  High Risk Drug Classes Is patient taking? Indication by Medication  Antipsychotic Yes, as an intravenous medication Prochlorperazine : nausea  Anticoagulant No   Antibiotic Yes, as an intravenous medication Cefazolin : MSSA bacteremia Levaquin : Proteus bacteremia  Opioid No   Antiplatelet No   Hypoglycemics/insulin  Yes Glargine, aspart insulin : T2DM  Vasoactive Medication Yes Carvedilol : HFpEF  Chemotherapy No   Other Yes Calcitriol : secondary hyperparathyroidism Synthroid : hypothyroidism Simvastatin : HLD Darbepoetin: anemia Senna-docusate: constipation   PRNs: Tylenol : pain Albuterol : SOB, wheezing Bisacodyl , lactulose , Milk and molasses enema: constipation Emla  cream: prep for HD Benadryl : itching Famotidine : heartburn Robitussin DM: cough Trazodone : sleep     Type of Medication Issue Identified Description of Issue Recommendation(s)  Drug Interaction(s) (clinically significant)     Duplicate Therapy     Allergy     No Medication Administration End Date     Incorrect Dose     Additional Drug Therapy Needed     Significant med changes from prior encounter (inform family/care partners about these prior to discharge). PTA medications not resumed: bimekizumab, dorzolamide  opth, timolol  opth, tirzepatide  Holding torsemide  and amlodipine  Consider resuming PTA medications during CIR admit or upon discharge as appropriate    Other       Clinically significant medication issues were identified that warrant physician communication and completion of prescribed/recommended actions by midnight of the next day:  No  Name of provider notified for urgent issues identified:   Provider Method of Notification:     Pharmacist comments:   Time spent performing this drug regimen  review (minutes):  30   Delon Sax, PharmD, BCPS Clinical Pharmacist 11/23/2024 3:14 PM

## 2024-11-23 NOTE — Progress Notes (Addendum)
 Inpatient Rehab Admissions Coordinator:   Awaiting determination from [facility].  Met with pt at bedside to update.  Will follow.   1242: I did get insurance approval for CIR admit.  Updated pt/family and TOC/MD.  Dr. Dino in agreement for admit to CIR today.  I will make arrangements.   Reche Lowers, PT, DPT Admissions Coordinator [phone] 11/23/2024 10:38 AM

## 2024-11-23 NOTE — H&P (Addendum)
 Physical Medicine and Rehabilitation Admission H&P     CC: Functional decline due to multiple medical issues.      HPI: Brendan [name] is a 69 year old male with history of T2DM, cervical and lumbar radiculopathy with neuropathy, fatty liver, atopic dermatitis, chronic HF, ESRD with recent transition to HD,  Fournier's gangrene perineum/scrotum s/p I & D 08/28/24, forearm abscess s/p I/D 10/22/24, recent fall 11/03/24 where he struck his head and found to have right frontal meningioma, posterior scalp and prefrontal scalp hematoma and discharged to home on 12/18. He was readmitted on 11/11/24 with DKA, hypotension, malaise and fever 101.1. He was started on fluid and broad spectrum antibiotics for sepsis. He was found to have MSSA and Proteus bacteremia felt to be due to line infection. He was noted to be lethargic and CT head done revaling regressed frontal gyrus hematoms and new small bilateral SDH question post traumatic CSF hygromas or low density SDH.  TEE negative for vegetation, showed moderate left lateral pleural effusion and  EF 55-60%. TDC was removed on 12/26 for line holiday.   Repeat [facility] 12/26 without growth and tunneled catheter placed on 12/30 by Dr. Philip.   On 1500 cc FR with resumption of HD TTS and Cefepime  changed to cefazolin  (to start 01/06 with HD) to complete 4 total weeks of antibiotics from line removal. Levaquin  to continue for additional week at renal dose. Blood sugars have been variable and lantus  being adjusted.       Review of Systems  Constitutional:  Negative for fever.  HENT:  Negative for hearing loss.   Eyes:  Positive for blurred vision (when he sits up--has been going on since TBI).  Respiratory:  Negative for cough and shortness of breath.   Cardiovascular:  Negative for chest pain.  Gastrointestinal:  Positive for diarrhea.  Genitourinary:  Negative for dysuria.  Musculoskeletal:  Negative for myalgias.  Neurological:  Positive for dizziness (when he gets  up), weakness and headaches.           Past Medical History:  Diagnosis Date   Acanthosis nigricans     Atopic dermatitis     CHF (congestive heart failure) (HCC)     CKD (chronic kidney disease) stage 4, GFR 15-29 ml/min (HCC) 01/08/2023   Diabetes (HCC) 11/19/2002   Erectile dysfunction     Fatty liver 11/19/2005   Glaucoma 11/19/2006   Gynecomastia 11/20/2007   Hx of adenomatous colonic polyps 08/26/2023   Hyperaldosteronism 11/19/1998   Hypercholesterolemia     Hyperlipidemia 2010   Hypertension 1999   Hypoglycemic reaction     Hypothyroidism 11/19/2002   Incontinence     Intermittent vertigo 11/19/2010   Left cervical radiculopathy 11/19/2010   Lumbar radiculopathy     Obesity     Peripheral neuropathy     Pollen allergies 11/19/2005    perennial   Prostate cancer (HCC) 11/19/2004   Reflux esophagitis 11/19/1993   Sickle cell trait 11/19/2004   Sleep apnea, obstructive 11/19/1998    uses a cpap   Venous insufficiency 11/20/2003   Vitamin D deficiency 11/19/2010             Past Surgical History:  Procedure Laterality Date   COLONOSCOPY   2006    normal   INCISION AND DRAINAGE OF WOUND N/A 08/28/2024    Procedure: IRRIGATION AND EXCISIONAL DEBRIDEMENT WOUND;  Surgeon: Lyndel Deward PARAS, MD;  Location: [facility] OR;  Service: General;  Laterality: N/A;  EXCISIONAL DEBRIDEMENT  INCISION AND DRAINAGE PERIRECTAL ABSCESS N/A 08/30/2024    Procedure: INCISION AND DRAINAGE OF PERINEAL WOUND;  Surgeon: Polly Cordella LABOR, MD;  Location: [facility] OR;  Service: General;  Laterality: N/A;   IR REMOVAL TUN CV CATH W/O FL   11/13/2024   IR TUNNELED CENTRAL VENOUS CATH PLC W IMG   09/18/2024   IR TUNNELED CENTRAL VENOUS CATH PLC W IMG   09/22/2024   IR TUNNELED CENTRAL VENOUS CATH PLC W IMG   11/17/2024   lap band surgery   2009   left inguinal hernia repair   1998   LIPOMA EXCISION Left 04/17/2023    Procedure: MINOR EXCISION LEFT BUTTOCK SEBACEOUS CYST;  Surgeon: Lyndel Deward PARAS, MD;  Location: [facility];  Service: General;  Laterality: Left;   robotic prostatectomy   2008   TRANSESOPHAGEAL ECHOCARDIOGRAM (CATH LAB) N/A 11/18/2024    Procedure: TRANSESOPHAGEAL ECHOCARDIOGRAM;  Surgeon: Santo Stanly LABOR, MD;  Location: [facility] INVASIVE CV LAB;  Service: Cardiovascular;  Laterality: N/A;               Family History  Problem Relation Age of Onset   Heart failure Mother     Hypertension Father          deceased age 39   Heart attack Father     COPD Sister     CVA Sister     Diabetes Daughter     Colon cancer Neg Hx     Stomach cancer Neg Hx     Colon polyps Neg Hx     Esophageal cancer Neg Hx     Rectal cancer Neg Hx            Social History:  reports that he has never smoked. He has never used smokeless tobacco. He reports current alcohol use. He reports that he does not use drugs.     Allergies: [Allergies]  [Allergies]      Allergen Reactions   Ace Inhibitors Cough   Maxidex  [Dexamethasone ] Other (See Comments)      Sexual dysfunction   Verapamil Other (See Comments)      constipation             Medications Prior to Admission  Medication Sig Dispense Refill   acetaminophen  (TYLENOL ) 500 MG tablet Take 500 mg by mouth every 6 (six) hours as needed for mild pain.       amLODipine  (NORVASC ) 5 MG tablet Take 1 tablet (5 mg total) by mouth daily. 90 tablet 3   bimekizumab-bkzx (BIMZELX) 160 MG/ML prefilled syringe Inject 1 Syringe into the skin every 30 (thirty) days.       carvedilol  (COREG ) 12.5 MG tablet Take 1 tablet (12.5 mg total) by mouth 2 (two) times daily with a meal. Follow your blood pressure outpatient and get refills and adjustments with your PCP outpatient 60 tablet 0   dorzolamide  (TRUSOPT ) 2 % ophthalmic solution Place 1 drop into both eyes daily.       insulin  glargine (LANTUS  SOLOSTAR) 100 UNIT/ML Solostar Pen Inject 15 Units into the skin daily. Follow up with your PCP outpatient for further  adjustment to your regimen (Patient taking differently: Inject 20 Units into the skin daily. Follow up with your PCP outpatient for further adjustment to your regimen)       insulin  lispro (HUMALOG  KWIKPEN) 200 UNIT/ML KwikPen Inject 0-15 Units into the skin 3 (three) times daily with meals. CBG < 70: treat low blood sugar CBG 70 -  120: 0 units  CBG 121 - 150: 2 units  CBG 151 - 200: 3 units  CBG 201 - 250: 5 units  CBG 251 - 300: 8 units  CBG 301 - 350: 11 units  CBG 351 - 400: 15 units  CBG >400: Call MD       levothyroxine  (SYNTHROID ) 125 MCG tablet Take 1 tablet (125 mcg total) by mouth every morning on an empty stomach 30 tablet 11   simvastatin  (ZOCOR ) 20 MG tablet Take 1 tablet (20 mg total) by mouth every evening. 90 tablet 3   tirzepatide  (MOUNJARO ) 15 MG/0.5ML Pen Inject 15 mg into the skin once a week. 6 mL 4   torsemide  (DEMADEX ) 100 MG tablet Take 1 tablet (100 mg total) by mouth in the morning except on dialysis days (Patient taking differently: Take 100 mg by mouth See admin instructions. Take one tablet by mouth on Mondays, Tuesdays, Thursdays and Fridays only per patient) 30 tablet 3   clobetasol  ointment (TEMOVATE ) 0.05 % Apply topically daily before sleeping. (Patient not taking: Reported on 08/29/2024) 60 g 1   Continuous Glucose Sensor (FREESTYLE LIBRE 3 PLUS SENSOR) MISC Apply to upper back of arm. Change every 15 days. 2 each 12   Continuous Glucose Sensor (FREESTYLE LIBRE 3 SENSOR) MISC Apply to upper back of arm and change ever 14 days. 6 each 3   Insulin  Pen Needle (TECHLITE PEN NEEDLES) 32G X 4 MM MISC Use to inject insulin  4 times daily 400 each 4   Insulin  Pen Needle 32G X 4 MM MISC Use to inject insulin  up to 4 times daily. 400 each 4   timolol  (BETIMOL ) 0.5 % ophthalmic solution Place 1 drop into both eyes daily.                  Home: Home Living Family/patient expects to be discharged to:: Private residence Living Arrangements: Spouse/significant  other Available Help at Discharge: Family, Available PRN/intermittently Type of Home: House Home Access: Stairs to enter [employer] of Steps: 4 Entrance Stairs-Rails: Right, Left, Can reach both Home Layout: One level Bathroom Shower/Tub: Health Visitor: Handicapped height (has BSC) Bathroom Accessibility: Yes Home Equipment: Cane - single point, Hand held shower head, Rolling Walker (2 wheels), BSC/3in1, Rollator (4 wheels) Additional Comments: Pt lives with wife who works during the day. Wife takes him to HD before going to work and then picks him up during her lunch hour and takes him home.   Functional History: Prior Function Prior Level of Function : Independent/Modified Independent Mobility Comments: Mod I, reports primarily use of rollator to walk ADLs Comments: Reports previously mod I, however wife has been assisting with LB ADLs since 12/16 admission   Functional Status:  Mobility: Bed Mobility Overal bed mobility: Needs Assistance Bed Mobility: Supine to Sit Supine to sit: Supervision Sit to supine: Min assist General bed mobility comments: CGA, HOB elevated, increased time due to fatigue/weakness Transfers Overall transfer level: Needs assistance Equipment used: Rolling walker (2 wheels) Transfers: Sit to/from Stand Sit to Stand: Contact guard assist, From elevated surface General transfer comment: CGA from elevated surface Ambulation/Gait Ambulation/Gait assistance: Min assist Gait Distance (Feet): 20 Feet (additional trial of 15') Assistive device: Rolling walker (2 wheels) Gait Pattern/deviations: Step-to pattern General Gait Details: slowed step-to gait, reduced gait speed and increased time to change direction Gait velocity: reduced Gait velocity interpretation: <1.8 ft/sec, indicate of risk for recurrent falls   ADL: ADL Overall ADL's : Needs assistance/impaired Eating/Feeding:  Set up, Sitting Grooming: Set up, Sitting Upper  Body Bathing: Set up, Sitting Lower Body Bathing: Moderate assistance, Sit to/from stand Upper Body Dressing : Set up, Sitting Lower Body Dressing: Moderate assistance, Sit to/from stand Toilet Transfer: Moderate assistance, Ambulation, Rolling walker (2 wheels) Toilet Transfer Details (indicate cue type and reason): ~80ft limited activity tolerance Functional mobility during ADLs: Moderate assistance, Rolling walker (2 wheels) General ADL Comments: Limited by decreased activity tolerance and strength   Cognition: Cognition Orientation Level: Oriented X4 [employer] Scales of Cognitive Functioning: Purposeful, Appropriate: Stand-by Assistance Cognition Arousal: Alert Behavior During Therapy: Flat affect   Physical Exam: Blood pressure (!) 129/55, pulse 67, temperature 98.8 F (37.1 C), temperature source Oral, resp. rate 20, height 6' 2 (1.88 m), weight 125 kg, SpO2 97%. Physical Exam Vitals and nursing note reviewed.  Constitutional:      General: He is not in acute distress.    Appearance: Normal appearance. He is obese. He is not ill-appearing.     Comments: Flat affect  HENT:     Head: Normocephalic and atraumatic.     Right Ear: External ear normal.     Left Ear: External ear normal.     Nose: Nose normal.     Mouth/Throat:     Mouth: Mucous membranes are moist.  Eyes:     Extraocular Movements: Extraocular movements intact.     Pupils: Pupils are equal, round, and reactive to light.  Cardiovascular:     Rate and Rhythm: Normal rate and regular rhythm.     Comments: Scabbed areas on Right upper chest wall at site of prior internal jugular. Pulmonary:     Effort: Pulmonary effort is normal. No respiratory distress.     Breath sounds: No wheezing or rhonchi.  Abdominal:     General: Bowel sounds are normal. There is no distension.     Palpations: Abdomen is soft.     Comments: Incision mid abdomen with small induration area (button?)  Musculoskeletal:         General: Swelling present.     Cervical back: Normal range of motion.  Skin:    Comments: Psoriasis, flaky skin bilateral palms and feet. To a lesser extent there are findings on legs. Callus under right great toe. Tunneled dialysis cath left internal jugular. Perineal wound packed  Neurological:     Mental Status: He is alert.     Comments: Oriented to self and place. Has difficulty with time line of recent admission. Soft voice and able to follow simple one step commands. Fair insight and awareness. Normal speech and language. No focal CN findings. MMT: BUE 4/5 prox to distal. BLE 4-HF, 4/5 KE and 4+ ADF/PF. Sensory exam normal for light touch and pain in all 4 limbs. No limb ataxia or cerebellar signs. No abnormal tone appreciated.    Psychiatric:     Comments: Flat but generally appropriate       Lab Results Last 48 Hours        Results for orders placed or performed during the hospital encounter of 11/11/24 (from the past 48 hours)  CBC     Status: Abnormal    Collection Time: 11/21/24  3:10 PM  Result Value Ref Range    WBC 32.8 (H) 4.0 - 10.5 K/uL    RBC 3.15 (L) 4.22 - 5.81 MIL/uL    Hemoglobin 8.9 (L) 13.0 - 17.0 g/dL    HCT 73.5 (L) 60.9 - 52.0 %    MCV  83.8 80.0 - 100.0 fL    MCH 28.3 26.0 - 34.0 pg    MCHC 33.7 30.0 - 36.0 g/dL    RDW 82.7 (H) 88.4 - 15.5 %    Platelets 273 150 - 400 K/uL    nRBC 0.0 0.0 - 0.2 %      Comment: Performed at [facility], [address] [place], [place] [address]  Glucose, capillary     Status: Abnormal    Collection Time: 11/21/24  8:12 PM  Result Value Ref Range    Glucose-Capillary 166 (H) 70 - 99 mg/dL      Comment: Glucose reference range applies only to samples taken after fasting for at least 8 hours.  CBC with Differential/Platelet     Status: Abnormal    Collection Time: 11/22/24  6:00 AM  Result Value Ref Range    WBC 24.1 (H) 4.0 - 10.5 K/uL    RBC 3.02 (L) 4.22 - 5.81 MIL/uL    Hemoglobin 8.4 (L) 13.0 - 17.0 g/dL     HCT 74.9 (L) 60.9 - 52.0 %    MCV 82.8 80.0 - 100.0 fL    MCH 27.8 26.0 - 34.0 pg    MCHC 33.6 30.0 - 36.0 g/dL    RDW 82.9 (H) 88.4 - 15.5 %    Platelets 241 150 - 400 K/uL    nRBC 0.0 0.0 - 0.2 %    Neutrophils Relative % 79 %    Neutro Abs 19.3 (H) 1.7 - 7.7 K/uL    Lymphocytes Relative 8 %    Lymphs Abs 1.8 0.7 - 4.0 K/uL    Monocytes Relative 8 %    Monocytes Absolute 1.9 (H) 0.1 - 1.0 K/uL    Eosinophils Relative 2 %    Eosinophils Absolute 0.4 0.0 - 0.5 K/uL    Basophils Relative 1 %    Basophils Absolute 0.2 (H) 0.0 - 0.1 K/uL    Immature Granulocytes 2 %    Abs Immature Granulocytes 0.44 (H) 0.00 - 0.07 K/uL      Comment: Performed at [facility], [address], [place], [place] [address]  Hepatic function panel     Status: Abnormal    Collection Time: 11/22/24  6:00 AM  Result Value Ref Range    Total Protein 6.2 (L) 6.5 - 8.1 g/dL    Albumin  2.4 (L) 3.5 - 5.0 g/dL    AST 12 (L) 15 - 41 U/L    ALT <5 0 - 44 U/L    Alkaline Phosphatase 101 38 - 126 U/L    Total Bilirubin 0.6 0.0 - 1.2 mg/dL    Bilirubin, Direct 0.2 0.0 - 0.2 mg/dL    Indirect Bilirubin 0.3 0.3 - 0.9 mg/dL      Comment: Performed at [facility], [address] [place], [place] [address]  Renal function panel     Status: Abnormal    Collection Time: 11/22/24  6:01 AM  Result Value Ref Range    Sodium 131 (L) 135 - 145 mmol/L    Potassium 4.2 3.5 - 5.1 mmol/L    Chloride 95 (L) 98 - 111 mmol/L    CO2 25 22 - 32 mmol/L    Glucose, Bld 332 (H) 70 - 99 mg/dL      Comment: Glucose reference range applies only to samples taken after fasting for at least 8 hours.    BUN 24 (H) 8 - 23 mg/dL  Creatinine, Ser 4.10 (H) 0.61 - 1.24 mg/dL    Calcium 7.9 (L) 8.9 - 10.3 mg/dL    Phosphorus 4.3 2.5 - 4.6 mg/dL    Albumin  2.2 (L) 3.5 - 5.0 g/dL    GFR, Estimated 15 (L) >60 mL/min      Comment: (NOTE) Calculated using the CKD-EPI Creatinine Equation (2021)      Anion gap 11 5 - 15       Comment: Performed at [facility], [address] [place], [place] [address]  Glucose, capillary     Status: Abnormal    Collection Time: 11/22/24  7:37 AM  Result Value Ref Range    Glucose-Capillary 313 (H) 70 - 99 mg/dL      Comment: Glucose reference range applies only to samples taken after fasting for at least 8 hours.  Glucose, capillary     Status: Abnormal    Collection Time: 11/22/24 11:55 AM  Result Value Ref Range    Glucose-Capillary 230 (H) 70 - 99 mg/dL      Comment: Glucose reference range applies only to samples taken after fasting for at least 8 hours.  Glucose, capillary     Status: Abnormal    Collection Time: 11/22/24  3:39 PM  Result Value Ref Range    Glucose-Capillary 225 (H) 70 - 99 mg/dL      Comment: Glucose reference range applies only to samples taken after fasting for at least 8 hours.  Glucose, capillary     Status: Abnormal    Collection Time: 11/22/24  8:13 PM  Result Value Ref Range    Glucose-Capillary 240 (H) 70 - 99 mg/dL      Comment: Glucose reference range applies only to samples taken after fasting for at least 8 hours.  Renal function panel     Status: Abnormal    Collection Time: 11/23/24  4:31 AM  Result Value Ref Range    Sodium 130 (L) 135 - 145 mmol/L    Potassium 4.0 3.5 - 5.1 mmol/L    Chloride 94 (L) 98 - 111 mmol/L    CO2 25 22 - 32 mmol/L    Glucose, Bld 175 (H) 70 - 99 mg/dL      Comment: Glucose reference range applies only to samples taken after fasting for at least 8 hours.    BUN 29 (H) 8 - 23 mg/dL    Creatinine, Ser 4.79 (H) 0.61 - 1.24 mg/dL    Calcium 8.2 (L) 8.9 - 10.3 mg/dL    Phosphorus 4.4 2.5 - 4.6 mg/dL    Albumin  2.2 (L) 3.5 - 5.0 g/dL    GFR, Estimated 11 (L) >60 mL/min      Comment: (NOTE) Calculated using the CKD-EPI Creatinine Equation (2021)      Anion gap 11 5 - 15      Comment: Performed at [facility], [address], [place], [place] [address]  CBC with Differential/Platelet      Status: Abnormal    Collection Time: 11/23/24  4:31 AM  Result Value Ref Range    WBC 21.8 (H) 4.0 - 10.5 K/uL    RBC 2.93 (L) 4.22 - 5.81 MIL/uL    Hemoglobin 8.1 (L) 13.0 - 17.0 g/dL    HCT 75.4 (L) 60.9 - 52.0 %    MCV 83.6 80.0 - 100.0 fL    MCH 27.6 26.0 - 34.0 pg    MCHC 33.1 30.0 - 36.0 g/dL    RDW 83.0 (  H) 11.5 - 15.5 %    Platelets 220 150 - 400 K/uL    nRBC 0.0 0.0 - 0.2 %    Neutrophils Relative % 86 %    Neutro Abs 19.0 (H) 1.7 - 7.7 K/uL    Lymphocytes Relative 5 %    Lymphs Abs 1.1 0.7 - 4.0 K/uL    Monocytes Relative 4 %    Monocytes Absolute 0.9 0.1 - 1.0 K/uL    Eosinophils Relative 2 %    Eosinophils Absolute 0.4 0.0 - 0.5 K/uL    Basophils Relative 1 %    Basophils Absolute 0.1 0.0 - 0.1 K/uL    Immature Granulocytes 2 %    Abs Immature Granulocytes 0.33 (H) 0.00 - 0.07 K/uL      Comment: Performed at [facility], [address], [place], [place] [address]  Glucose, capillary     Status: Abnormal    Collection Time: 11/23/24  8:20 AM  Result Value Ref Range    Glucose-Capillary 193 (H) 70 - 99 mg/dL      Comment: Glucose reference range applies only to samples taken after fasting for at least 8 hours.  Glucose, capillary     Status: Abnormal    Collection Time: 11/23/24 12:41 PM  Result Value Ref Range    Glucose-Capillary 179 (H) 70 - 99 mg/dL      Comment: Glucose reference range applies only to samples taken after fasting for at least 8 hours.      Imaging Results (Last 48 hours)  No results found.         Blood pressure (!) 129/55, pulse 67, temperature 98.8 F (37.1 C), temperature source Oral, resp. rate 20, height 6' 2 (1.88 m), weight 125 kg, SpO2 97%.   Medical Problem List and Plan: 1. Functional deficits secondary to debility due subacute TBI after fall, sepsis             -patient may  shower             -ELOS/Goals: 10-12 days, supervision to mod I goals 2.  Antithrombotics: -DVT/anticoagulation:  Pharmaceutical: Heparin               -antiplatelet therapy: N/A 3. Pain Management: Tylenol  prn.  4. Mood/Behavior/Sleep: LCSW to follow for evaluation and support.              -antipsychotic agents: N/A  -pt struggling to sleep d/t visits by staff. Try to limit HS interruptions while on rehab. He denies that pain or his CPAP machine are contributing to problem.   -check sleep chart   5. Neuropsych/cognition: This patient may be capable of making decisions on his own behalf. 6. Skin/Wound Care: Routine pressure relief measures             -eucerin cream to bilateral hands and feet bid, pt states he's been dealing with it for several months. ?psoriasis 7. Fluids/Electrolytes/Nutrition: Strict I/O. Labs with HD 8. MSSA/ Proteus mirabilis bacteremia: Now on Cefazolin  for 4 total weeks. Levaquin  added on 01/04 for additional week 9.  T2DM: Hgb A1C- 8.4%. Monitor BS ac/hs and use SSI for elevated BS             --continue Lantus  increased to 25 units on 01/05 and on 5 units TID for meal coverage.              -glucose control is gradually improving 10. HTN: BP has been on low side likely due to sepsis.  --  On coreg  6.25 mg bid. Continue to hold Norvasc .  11. Fournier's gangrene s/p I & D 08/2024: Continue to pack wound bid.  12. Anemia of chronic disease: On weekly aranesp . Continue to monitor H/H. 13.  OSA: Continue CPAP 14. Constipation: On Miralax  and senna but refusing second dose-->will simplify regimen. 15. TBI w/SDH: Reports occipital HA, blurry vision and dizziness once up.              --needs vestibular evaluation     Sharlet GORMAN Schmitz, PA-C 11/23/2024  I have personally performed a face to face diagnostic evaluation of this patient and formulated the key components of the plan.  Additionally, I have personally reviewed laboratory data, imaging studies, as well as relevant notes and concur with the physician assistant's documentation above.  The patient's status has not changed from the original H&P.  Any changes  in documentation from the acute care chart have been noted above.  Arthea IVAR Gunther, MD, [facility]LL[facility]

## 2024-11-23 NOTE — Discharge Summary (Addendum)
 " Physician Discharge Summary   Patient: Brendan [name]. MRN: [id number] DOB: 1956-03-21  Admit date:     11/11/2024  Discharge date: 11/23/2024  Discharge Physician: Deliliah Room   PCP: Rexanne Ingle, MD   Recommendations at discharge:    F/u with your PCP in one week. F/u with ID in a month HD as per schedule Continue antibiotics as prescribed  Discharge Diagnoses: Principal Problem:   Sepsis (HCC) Active Problems:   Insulin -requiring or dependent type II diabetes mellitus (HCC)   Hypotension   History of traumatic brain injury   ESRD on dialysis (HCC)   Heart failure with preserved ejection fraction (HCC)   Open wound   Hypothyroidism   Anemia of chronic disease   Hyperlipidemia associated with type 2 diabetes mellitus (HCC)   OSA (obstructive sleep apnea)   Fever, unknown origin   MSSA bacteremia   Bacteremia   Psoriasis   Bloodstream infection due to central venous catheter   Hospital Course:  69 year old male with PMH of DM2, HTN, HLD, poor thyroidism, OSA on CPAP, Fournier's gangrene (October 2025), CKD stage V recently started on HD, recent admission for fall with TBI (12/16-12/18/25) who presented to the ED with fatigue. He was found to be febrile with leukocytosis and was managed for sepsis with unclear etiology. On 12/25, there was concern for DKA and he was managed in the progressive unit. 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 His blood cultures grew MSSA and proteus, for which ID was consulted. Potential sources included HD line and psoraitic rash on his palms. His HD line was removed on 12/26, and replaced on 12/30 after a line holiday. He underwent TEE on 12/31, which was negative for endocarditis.   Sepsis secondary to MSSA and proteus mirabilis bacteremia related to HD line/CLABSI: Improving HD cath removed by IR on 12/26 and replaced on 12/30 after a line holiday S/p TEE with no evidence of  endocarditis Still with leukocytosis but no other focal symptoms. 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 Dianah (dermatology) for the past 6 years    AKI now advanced to ESRD, TTS Schedule Last HD on 1/3. Next HD on 1/6   HFpEF, chronic, POA: NYHA IV Will not recommend SGLT2 in the future given groin wounds Continue to hold amlodipine , and torsemide  for now Resumed coreg  at a lower dose   Hypothyroidism Continue Synthroid  125   Recent TBI brain managed nonsurgically during hospitalization 12/16-12/9. 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 Victory Boers 12/26 neurosurgery --absence of mental status changes or focal deficit --nothing surgically specific to do in this case   Scrotal wound in the setting of Fournier's gangrene as above Last seen [facility] surgery 10/13/2024 WOC following   Class II Obesity,POA: On Tirzepatide  at home   Hyponatremia - improved with HD, continue fluid restriction    Constipation - continue bowel regimen with miralax , senokot, PRN lactulose   Disposition: CIR      Consultants: ID, Nephro, PMR Procedures performed: TEE, HD catheter removed on 12/26, Placement of tunneled dialysis catheter on 11/17/24 Disposition: Rehabilitation facility Diet recommendation:  Carb modified diet DISCHARGE MEDICATION: Allergies as of 11/23/2024       Reactions   Ace Inhibitors Cough   Maxidex  [dexamethasone ] Other (See Comments)   Sexual  dysfunction   Verapamil Other (See Comments)   constipation        Medication List     STOP taking these medications    Lantus  SoloStar 100 UNIT/ML Solostar Pen Generic drug: insulin  glargine   torsemide  100 MG tablet Commonly known as: DEMADEX        TAKE these medications    acetaminophen  500 MG tablet Commonly known as: TYLENOL  Take 500 mg by mouth every 6 (six) hours as needed for mild pain.   amLODipine  5 MG tablet Commonly known as: NORVASC  Take 1 tablet (5 mg total) by mouth daily.   Bimzelx 160 MG/ML prefilled syringe Generic drug: bimekizumab-bkzx Inject 1 Syringe into the skin every 30 (thirty) days.   calcitRIOL  0.25 MCG capsule Commonly known as: ROCALTROL  Take 1 capsule (0.25 mcg total) by mouth daily. Start taking on: November 24, 2024   carvedilol  6.25 MG tablet Commonly known as: COREG  Take 1 tablet (6.25 mg total) by mouth 2 (two) times daily with a meal. What changed:  medication strength how much to take additional instructions   ceFAZolin  2-4 GM/100ML-% IVPB Commonly known as: ANCEF  Inject 100 mLs (2 g total) into the vein Every Tuesday,Thursday,and Saturday with dialysis for 28 days. Start taking on: November 24, 2024   clobetasol  ointment 0.05 % Commonly known as: TEMOVATE  Apply topically daily before sleeping.   Darbepoetin Alfa  100 MCG/0.5ML Sosy injection Commonly known as: ARANESP  Inject 0.5 mLs (100 mcg total) into the skin every Tuesday at 6 PM. Start taking on: November 24, 2024   dorzolamide  2 % ophthalmic solution Commonly known as: TRUSOPT  Place 1 drop into both eyes daily.   FreeStyle Libre 3 Sensor Misc Apply to upper back of arm and change ever 14 days.   FreeStyle Libre 3 Plus Sensor Misc Apply to upper back of arm. Change every 15 days.   HumaLOG  KwikPen 200 UNIT/ML KwikPen Generic drug: insulin  lispro Inject 0-15 Units into the skin 3 (three) times daily with meals. CBG < 70: treat low blood sugar CBG 70 - 120:  0 units  CBG 121 - 150: 2 units  CBG 151 - 200: 3 units  CBG 201 - 250: 5 units  CBG 251 - 300: 8 units  CBG 301 - 350: 11 units  CBG 351 - 400: 15 units  CBG >400: Call MD   insulin  aspart 100 UNIT/ML injection Commonly known as: novoLOG  Inject 5 Units into the skin 3 (three) times daily with meals.   insulin  glargine-yfgn 100 UNIT/ML injection Commonly known as: Semglee  (yfgn) Inject 0.25 mLs (25 Units total) into the skin daily. Start taking on: November 24, 2024   Insupen Pen Needles 32G X 4 MM Misc Generic drug: Insulin  Pen Needle Use to inject insulin  4 times daily   Insupen Pen Needles 32G X 4 MM Misc Generic drug: Insulin  Pen Needle Use to inject insulin  up to 4 times daily.   levofloxacin  500 MG tablet Commonly known as: LEVAQUIN  Take 1 tablet (500 mg total) by mouth every other day for 7 days. Start taking on: November 24, 2024   levothyroxine  125 MCG tablet Commonly  known as: SYNTHROID  Take 1 tablet (125 mcg total) by mouth every morning on an empty stomach   Mounjaro  15 MG/0.5ML Pen Generic drug: tirzepatide  Inject 15 mg into the skin once a week.   simvastatin  20 MG tablet Commonly known as: ZOCOR  Take 1 tablet (20 mg total) by mouth every evening.   timolol  0.5 % ophthalmic solution Commonly known as: BETIMOL  Place 1 drop into both eyes daily.               Home Infusion Instuctions  (From admission, onward)           Start     Ordered   11/18/24 0000  Home infusion instructions       Comments: To be given at the patient's hemodialysis center  Question:  Instructions  Answer:  Flushing of vascular access device: 0.9% NaCl pre/post medication administration and prn patency; Heparin  100 u/ml, 5ml for implanted ports and Heparin  10u/ml, 5ml for all other central venous catheters.   11/18/24 1541            Follow-up Information     Polite, Tanda, MD. Schedule an appointment as soon as possible for a visit in 1 week(s).   Specialty:  Internal Medicine Contact information: [address] [place] [address] [phone]         [place] Constance DASEN, MD. Schedule an appointment as soon as possible for a visit in 1 month(s).   Specialty: Infectious Diseases Contact information: [address] [place] [address] [place] [address] [phone]                Discharge Exam: Fredricka Weights   11/21/24 1904 11/22/24 0405 11/23/24 0355  Weight: 121.6 kg 121.6 kg 125 kg   General exam: Appears calm and comfortable  Respiratory system: Clear to auscultation. Respiratory effort normal. Cardiovascular system: S1 & S2 heard, RRR. No JVD, murmurs, rubs, gallops or clicks. No pedal edema. Gastrointestinal system: Abdomen is nondistended, soft and nontender. No organomegaly or masses felt. Normal bowel sounds heard. Central nervous system: Alert and oriented. No focal neurological deficits. Extremities: Symmetric 5 x 5 power. Skin: No rashes, lesions or ulcers Psychiatry: Judgement and insight appear normal. Mood & affect appropriate.  Condition at discharge: good  The results of significant diagnostics from this hospitalization (including imaging, microbiology, ancillary and laboratory) are listed below for reference.   Imaging Studies:   Microbiology: Results for orders placed or performed during the hospital encounter of 11/11/24  Resp panel by RT-PCR (RSV, Flu A&B, Covid) Anterior Nasal Swab     Status: None   Collection Time: 11/11/24 11:25 AM   Specimen: Anterior Nasal Swab  Result Value Ref Range Status   SARS Coronavirus 2 by RT PCR NEGATIVE NEGATIVE Final   Influenza A by PCR NEGATIVE NEGATIVE Final   Influenza B by PCR NEGATIVE NEGATIVE Final    Comment: (NOTE) The Xpert Xpress SARS-CoV-2/FLU/RSV plus assay is intended as an aid in the diagnosis of influenza from Nasopharyngeal swab specimens and should not be used as a sole basis for treatment. Nasal washings and aspirates are unacceptable  for Xpert Xpress SARS-CoV-2/FLU/RSV testing.  Fact Sheet for Patients: bloggercourse.com  Fact Sheet for Healthcare Providers: seriousbroker.it  This test is not yet approved or cleared by the [place]  FDA and has been authorized for detection and/or diagnosis of SARS-CoV-2 by FDA under an Emergency Use Authorization (EUA). This EUA will remain in effect (meaning this test can be used)  for the duration of the COVID-19 declaration under Section 564(b)(1) of the Act, 21 U.S.C. section 360bbb-3(b)(1), unless the authorization is terminated or revoked.     Resp Syncytial Virus by PCR NEGATIVE NEGATIVE Final    Comment: (NOTE) Fact Sheet for Patients: bloggercourse.com  Fact Sheet for Healthcare Providers: seriousbroker.it  This test is not yet approved or cleared by the [place]  FDA and has been authorized for detection and/or diagnosis of SARS-CoV-2 by FDA under an Emergency Use Authorization (EUA). This EUA will remain in effect (meaning this test can be used) for the duration of the COVID-19 declaration under Section 564(b)(1) of the Act, 21 U.S.C. section 360bbb-3(b)(1), unless the authorization is terminated or revoked.  Performed at [facility], [address], [place], [place] [address]   Blood culture (routine x 2)     Status: Abnormal   Collection Time: 11/11/24  1:29 PM   Specimen: BLOOD  Result Value Ref Range Status   Specimen Description BLOOD SITE NOT SPECIFIED  Final   Special Requests   Final    BOTTLES DRAWN AEROBIC AND ANAEROBIC Blood Culture results may not be optimal due to an inadequate volume of blood received in culture bottles   Culture  Setup Time   Final    GRAM POSITIVE COCCI IN CLUSTERS GRAM NEGATIVE RODS IN BOTH AEROBIC AND ANAEROBIC BOTTLES CRITICAL RESULT CALLED TO, READ BACK BY AND VERIFIED WITH: PHARMD G ABBOTT 11/12/2024 @  0347 BY AB Performed at [facility], [address] [place], [place] [address]    Culture PROTEUS MIRABILIS STAPHYLOCOCCUS AUREUS  (A)  Final   Report Status 11/15/2024 FINAL  Final   Organism ID, Bacteria PROTEUS MIRABILIS  Final   Organism ID, Bacteria STAPHYLOCOCCUS AUREUS  Final      Susceptibility   Proteus mirabilis - MIC*    AMPICILLIN  <=2 SENSITIVE Sensitive     CEFAZOLIN  (NON-URINE) 4 INTERMEDIATE Intermediate     CEFEPIME  <=0.12 SENSITIVE Sensitive     ERTAPENEM <=0.12 SENSITIVE Sensitive     CEFTRIAXONE  <=0.25 SENSITIVE Sensitive     CIPROFLOXACIN <=0.06 SENSITIVE Sensitive     GENTAMICIN <=1 SENSITIVE Sensitive     MEROPENEM 1 SENSITIVE Sensitive     TRIMETH/SULFA <=20 SENSITIVE Sensitive     AMPICILLIN /SULBACTAM <=2 SENSITIVE Sensitive     PIP/TAZO Value in next row Sensitive      <=4 SENSITIVEThis is a modified FDA-approved test that has been validated and its performance characteristics determined by the reporting laboratory.  This laboratory is certified under the Clinical Laboratory Improvement Amendments CLIA as qualified to perform high complexity clinical laboratory testing.    * PROTEUS MIRABILIS   Staphylococcus aureus - MIC*    CIPROFLOXACIN Value in next row Sensitive      <=4 SENSITIVEThis is a modified FDA-approved test that has been validated and its performance characteristics determined by the reporting laboratory.  This laboratory is certified under the Clinical Laboratory Improvement Amendments CLIA as qualified to perform high complexity clinical laboratory testing.    ERYTHROMYCIN Value in next row Resistant      <=4 SENSITIVEThis is a modified FDA-approved test that has been validated and its performance characteristics determined by the reporting laboratory.  This laboratory is certified under the Clinical Laboratory Improvement Amendments CLIA as qualified to perform high complexity clinical laboratory testing.    GENTAMICIN Value in next row  Sensitive      <=4 SENSITIVEThis is a modified FDA-approved test that has been  validated and its performance characteristics determined by the reporting laboratory.  This laboratory is certified under the Clinical Laboratory Improvement Amendments CLIA as qualified to perform high complexity clinical laboratory testing.    OXACILLIN Value in next row Sensitive      <=4 SENSITIVEThis is a modified FDA-approved test that has been validated and its performance characteristics determined by the reporting laboratory.  This laboratory is certified under the Clinical Laboratory Improvement Amendments CLIA as qualified to perform high complexity clinical laboratory testing.    TETRACYCLINE Value in next row Resistant      <=4 SENSITIVEThis is a modified FDA-approved test that has been validated and its performance characteristics determined by the reporting laboratory.  This laboratory is certified under the Clinical Laboratory Improvement Amendments CLIA as qualified to perform high complexity clinical laboratory testing.    VANCOMYCIN  Value in next row Sensitive      <=4 SENSITIVEThis is a modified FDA-approved test that has been validated and its performance characteristics determined by the reporting laboratory.  This laboratory is certified under the Clinical Laboratory Improvement Amendments CLIA as qualified to perform high complexity clinical laboratory testing.    TRIMETH/SULFA Value in next row Sensitive      <=4 SENSITIVEThis is a modified FDA-approved test that has been validated and its performance characteristics determined by the reporting laboratory.  This laboratory is certified under the Clinical Laboratory Improvement Amendments CLIA as qualified to perform high complexity clinical laboratory testing.    CLINDAMYCIN Value in next row Resistant      <=4 SENSITIVEThis is a modified FDA-approved test that has been validated and its performance characteristics determined by the reporting  laboratory.  This laboratory is certified under the Clinical Laboratory Improvement Amendments CLIA as qualified to perform high complexity clinical laboratory testing.    RIFAMPIN Value in next row Sensitive      <=4 SENSITIVEThis is a modified FDA-approved test that has been validated and its performance characteristics determined by the reporting laboratory.  This laboratory is certified under the Clinical Laboratory Improvement Amendments CLIA as qualified to perform high complexity clinical laboratory testing.    Inducible Clindamycin Value in next row Sensitive      <=4 SENSITIVEThis is a modified FDA-approved test that has been validated and its performance characteristics determined by the reporting laboratory.  This laboratory is certified under the Clinical Laboratory Improvement Amendments CLIA as qualified to perform high complexity clinical laboratory testing.    LINEZOLID  Value in next row Sensitive      <=4 SENSITIVEThis is a modified FDA-approved test that has been validated and its performance characteristics determined by the reporting laboratory.  This laboratory is certified under the Clinical Laboratory Improvement Amendments CLIA as qualified to perform high complexity clinical laboratory testing.    * STAPHYLOCOCCUS AUREUS  Blood Culture ID Panel (Reflexed)     Status: Abnormal   Collection Time: 11/11/24  1:29 PM  Result Value Ref Range Status   Enterococcus faecalis NOT DETECTED NOT DETECTED Corrected   Enterococcus Faecium NOT DETECTED NOT DETECTED Corrected   Listeria monocytogenes NOT DETECTED NOT DETECTED Corrected   Staphylococcus species DETECTED (A) NOT DETECTED Corrected    Comment: CRITICAL RESULT CALLED TO, READ BACK BY AND VERIFIED WITH: PHARMD G ABBOTT 11/12/2024 @ 0347 BY AB    Staphylococcus aureus (BCID) DETECTED (A) NOT DETECTED Corrected    Comment: CRITICAL RESULT CALLED TO, READ BACK BY AND VERIFIED WITH: PHARMD G ABBOTT 11/12/2024 @ 0347 BY AB  Staphylococcus epidermidis NOT DETECTED NOT DETECTED Corrected   Staphylococcus lugdunensis NOT DETECTED NOT DETECTED Corrected   Streptococcus species NOT DETECTED NOT DETECTED Corrected   Streptococcus agalactiae NOT DETECTED NOT DETECTED Corrected   Streptococcus pneumoniae NOT DETECTED NOT DETECTED Corrected   Streptococcus pyogenes NOT DETECTED NOT DETECTED Corrected   A.calcoaceticus-baumannii NOT DETECTED NOT DETECTED Corrected   Bacteroides fragilis NOT DETECTED NOT DETECTED Corrected   Enterobacterales DETECTED (A) NOT DETECTED Corrected    Comment: Enterobacterales represent a large order of gram negative bacteria, not a single organism. CRITICAL RESULT CALLED TO, READ BACK BY AND VERIFIED WITH: PHARMD G ABBOTT 11/12/2024 @ 0347 BY AB    Enterobacter cloacae complex NOT DETECTED NOT DETECTED Corrected   Escherichia coli NOT DETECTED NOT DETECTED Corrected   Klebsiella aerogenes NOT DETECTED NOT DETECTED Corrected   Klebsiella oxytoca NOT DETECTED NOT DETECTED Corrected   Klebsiella pneumoniae NOT DETECTED NOT DETECTED Corrected   Proteus species DETECTED (A) NOT DETECTED Corrected    Comment: CRITICAL RESULT CALLED TO, READ BACK BY AND VERIFIED WITH: PHARMD G ABBOTT 11/12/2024 @ 0347 BY AB    Salmonella species NOT DETECTED NOT DETECTED Corrected   Serratia marcescens NOT DETECTED NOT DETECTED Corrected   Haemophilus influenzae NOT DETECTED NOT DETECTED Corrected   Neisseria meningitidis NOT DETECTED NOT DETECTED Corrected   Pseudomonas aeruginosa NOT DETECTED NOT DETECTED Corrected   Stenotrophomonas maltophilia NOT DETECTED NOT DETECTED Corrected   Candida albicans NOT DETECTED NOT DETECTED Corrected   Candida auris NOT DETECTED NOT DETECTED Corrected   Candida glabrata NOT DETECTED NOT DETECTED Corrected   Candida krusei NOT DETECTED NOT DETECTED Corrected   Candida parapsilosis NOT DETECTED NOT DETECTED Corrected   Candida tropicalis NOT DETECTED NOT DETECTED Corrected    Cryptococcus neoformans/gattii NOT DETECTED NOT DETECTED Corrected   CTX-M ESBL NOT DETECTED NOT DETECTED Corrected   Carbapenem resistance IMP NOT DETECTED NOT DETECTED Corrected   Carbapenem resistance KPC NOT DETECTED NOT DETECTED Corrected   Meth resistant mecA/C and MREJ NOT DETECTED NOT DETECTED Corrected   Carbapenem resistance NDM NOT DETECTED NOT DETECTED Corrected   Carbapenem resist OXA 48 LIKE NOT DETECTED NOT DETECTED Corrected   Carbapenem resistance VIM NOT DETECTED NOT DETECTED Corrected    Comment: Performed at [facility], [address] [place], [place] [address]  Blood culture (routine x 2)     Status: None   Collection Time: 11/11/24  8:57 PM   Specimen: BLOOD LEFT HAND  Result Value Ref Range Status   Specimen Description BLOOD LEFT HAND  Final   Special Requests   Final    BOTTLES DRAWN AEROBIC AND ANAEROBIC Blood Culture adequate volume   Culture   Final    NO GROWTH 5 DAYS Performed at [facility], [address], [place], [place] [address]    Report Status 11/16/2024 FINAL  Final  MRSA Next Gen by PCR, Nasal     Status: None   Collection Time: 11/11/24  9:48 PM   Specimen: Nasal Mucosa; Nasal Swab  Result Value Ref Range Status   MRSA by PCR Next Gen NOT DETECTED NOT DETECTED Final    Comment: (NOTE) The GeneXpert MRSA Assay (FDA approved for NASAL specimens only), is one component of a comprehensive MRSA colonization surveillance program. It is not intended to diagnose MRSA infection nor to guide or monitor treatment for MRSA infections. Test performance is not FDA approved in patients less than 74 years old. Performed at [facility]  Lab, [address], [place], [place] [address]   Respiratory (~20 pathogens) panel by PCR     Status: None   Collection Time: 11/12/24 10:28 AM   Specimen: Nasopharyngeal Swab; Respiratory  Result Value Ref Range Status   Adenovirus NOT DETECTED NOT DETECTED Final   Coronavirus 229E NOT DETECTED NOT  DETECTED Final    Comment: (NOTE) The Coronavirus on the Respiratory Panel, DOES NOT test for the novel  Coronavirus (2019 nCoV)    Coronavirus HKU1 NOT DETECTED NOT DETECTED Final   Coronavirus NL63 NOT DETECTED NOT DETECTED Final   Coronavirus OC43 NOT DETECTED NOT DETECTED Final   Metapneumovirus NOT DETECTED NOT DETECTED Final   Rhinovirus / Enterovirus NOT DETECTED NOT DETECTED Final   Influenza A NOT DETECTED NOT DETECTED Final   Influenza B NOT DETECTED NOT DETECTED Final   Parainfluenza Virus 1 NOT DETECTED NOT DETECTED Final   Parainfluenza Virus 2 NOT DETECTED NOT DETECTED Final   Parainfluenza Virus 3 NOT DETECTED NOT DETECTED Final   Parainfluenza Virus 4 NOT DETECTED NOT DETECTED Final   Respiratory Syncytial Virus NOT DETECTED NOT DETECTED Final   Bordetella pertussis NOT DETECTED NOT DETECTED Final   Bordetella Parapertussis NOT DETECTED NOT DETECTED Final   Chlamydophila pneumoniae NOT DETECTED NOT DETECTED Final   Mycoplasma pneumoniae NOT DETECTED NOT DETECTED Final    Comment: Performed at [facility], [address] [place], [place] [address]  Culture, blood (Routine X 2) w Reflex to ID Panel     Status: None   Collection Time: 11/13/24  7:29 AM   Specimen: BLOOD RIGHT HAND  Result Value Ref Range Status   Specimen Description BLOOD RIGHT HAND  Final   Special Requests   Final    BOTTLES DRAWN AEROBIC AND ANAEROBIC Blood Culture results may not be optimal due to an inadequate volume of blood received in culture bottles   Culture   Final    NO GROWTH 5 DAYS Performed at [facility], [address], [place], [place] [address]    Report Status 11/18/2024 FINAL  Final  Culture, blood (Routine X 2) w Reflex to ID Panel     Status: None   Collection Time: 11/13/24  7:29 AM   Specimen: BLOOD LEFT ARM  Result Value Ref Range Status   Specimen Description BLOOD LEFT ARM  Final   Special Requests   Final    BOTTLES DRAWN AEROBIC AND ANAEROBIC Blood  Culture results may not be optimal due to an inadequate volume of blood received in culture bottles   Culture   Final    NO GROWTH 5 DAYS Performed at [facility], [address], [place], [place] [address]    Report Status 11/18/2024 FINAL  Final    Labs: CBC: Recent Labs  Lab 11/18/24 1019 11/20/24 0203 11/21/24 1510 11/22/24 0600 11/23/24 0431  WBC 18.0* 22.5* 32.8* 24.1* 21.8*  NEUTROABS 12.9*  --   --  19.3* 19.0*  HGB 9.6* 8.9* 8.9* 8.4* 8.1*  HCT 26.9* 25.8* 26.4* 25.0* 24.5*  MCV 78.7* 80.9 83.8 82.8 83.6  PLT 125* 216 273 241 220   Basic Metabolic Panel: Recent Labs  Lab 11/19/24 1039 11/20/24 0203 11/21/24 0532 11/22/24 0601 11/23/24 0431  NA 130* 132* 133* 131* 130*  K 4.2 3.6 3.8 4.2 4.0  CL 94* 95* 96* 95* 94*  CO2 21* 27 27 25 25   GLUCOSE 272* 260* 169* 332* 175*  BUN 52* 30* 36*  24* 29*  CREATININE 5.33* 3.97* 5.42* 4.10* 5.20*  CALCIUM 8.1* 8.0* 8.2* 7.9* 8.2*  PHOS 4.5 3.1 3.8 4.3 4.4   Liver Function Tests: Recent Labs  Lab 11/17/24 0246 11/18/24 1018 11/19/24 1039 11/20/24 0203 11/21/24 0532 11/22/24 0600 11/22/24 0601 11/23/24 0431  AST 11* 11*  --   --   --  12*  --   --   ALT 7 6  --   --   --  <5  --   --   ALKPHOS 122 124  --   --   --  101  --   --   BILITOT 0.5 0.6  --   --   --  0.6  --   --   PROT 6.0* 6.3*  --   --   --  6.2*  --   --   ALBUMIN  2.3* 2.4*   < > 2.3* 2.3* 2.4* 2.2* 2.2*   < > = values in this interval not displayed.   CBG: Recent Labs  Lab 11/22/24 1155 11/22/24 1539 11/22/24 2013 11/23/24 0820 11/23/24 1241  GLUCAP 230* 225* 240* 193* 179*    Discharge time spent: 40 minutes.  Signed: Deliliah Room, MD [facility] 11/23/2024 "

## 2024-11-23 NOTE — Progress Notes (Signed)
 Met with patient to review current situation, team conference and plan of care. Reviewed medications, daily weights, wound care, fluid restrictions cpap use. Reviewed co morbidities. Continue to follow along to provide educational needs to facilitate preparation for discharge.

## 2024-11-23 NOTE — H&P (Signed)
 "   Physical Medicine and Rehabilitation Admission H&P    CC: Functional decline due to multiple medical issues.    HPI: Brendan [name] is a 69 year old male with history of T2DM, cervical and lumbar radiculopathy with neuropathy, fatty liver, atopic dermatitis, chronic HF, ESRD with recent transition to HD,  Fournier's gangrene perineum/scrotum s/p I & D 08/28/24, forearm abscess s/p I/D 10/22/24, recent fall 11/03/24 where he struck his head and found to have right frontal meningioma, posterior scalp and prefrontal scalp hematoma and discharged to home on 12/18. He was readmitted on 11/11/24 with DKA, hypotension, malaise and fever 101.1. He was started on fluid and broad spectrum antibiotics for sepsis. He was found to have MSSA and Proteus bacteremia felt to be due to line infection. He was noted to be lethargic and CT head done revaling regressed frontal gyrus hematoms and new small bilateral SDH question post traumatic CSF hygromas or low density SDH.  TEE negative for vegetation, showed moderate left lateral pleural effusion and  EF 55-60%. TDC was removed on 12/26 for line holiday.  Repeat [facility] 12/26 without growth and tunneled catheter placed on 12/30 by Dr. Philip.   On 1500 cc FR with resumption of HD TTS and Cefepime  changed to cefazolin  (to start 01/06 with HD) to complete 4 total weeks of antibiotics from line removal. Levaquin  to continue for additional week at renal dose. Blood sugars have been variable and lantus  being adjusted.     Review of Systems  Constitutional:  Negative for fever.  HENT:  Negative for hearing loss.   Eyes:  Positive for blurred vision (when he sits up--has been going on since TBI).  Respiratory:  Negative for cough and shortness of breath.   Cardiovascular:  Negative for chest pain.  Gastrointestinal:  Positive for diarrhea.  Genitourinary:  Negative for dysuria.  Musculoskeletal:  Negative for myalgias.  Neurological:  Positive for dizziness (when he gets  up), weakness and headaches.     Past Medical History:  Diagnosis Date   Acanthosis nigricans    Atopic dermatitis    CHF (congestive heart failure) (HCC)    CKD (chronic kidney disease) stage 4, GFR 15-29 ml/min (HCC) 01/08/2023   Diabetes (HCC) 11/19/2002   Erectile dysfunction    Fatty liver 11/19/2005   Glaucoma 11/19/2006   Gynecomastia 11/20/2007   Hx of adenomatous colonic polyps 08/26/2023   Hyperaldosteronism 11/19/1998   Hypercholesterolemia    Hyperlipidemia 2010   Hypertension 1999   Hypoglycemic reaction    Hypothyroidism 11/19/2002   Incontinence    Intermittent vertigo 11/19/2010   Left cervical radiculopathy 11/19/2010   Lumbar radiculopathy    Obesity    Peripheral neuropathy    Pollen allergies 11/19/2005   perennial   Prostate cancer (HCC) 11/19/2004   Reflux esophagitis 11/19/1993   Sickle cell trait 11/19/2004   Sleep apnea, obstructive 11/19/1998   uses a cpap   Venous insufficiency 11/20/2003   Vitamin D deficiency 11/19/2010   Past Surgical History:  Procedure Laterality Date   COLONOSCOPY  2006   normal   INCISION AND DRAINAGE OF WOUND N/A 08/28/2024   Procedure: IRRIGATION AND EXCISIONAL DEBRIDEMENT WOUND;  Surgeon: Lyndel Deward PARAS, MD;  Location: [facility] OR;  Service: General;  Laterality: N/A;  EXCISIONAL DEBRIDEMENT   INCISION AND DRAINAGE PERIRECTAL ABSCESS N/A 08/30/2024   Procedure: INCISION AND DRAINAGE OF PERINEAL WOUND;  Surgeon: Polly Cordella LABOR, MD;  Location: [facility] OR;  Service: General;  Laterality: N/A;  IR REMOVAL TUN CV CATH W/O FL  11/13/2024   IR TUNNELED CENTRAL VENOUS CATH PLC W IMG  09/18/2024   IR TUNNELED CENTRAL VENOUS CATH PLC W IMG  09/22/2024   IR TUNNELED CENTRAL VENOUS CATH PLC W IMG  11/17/2024   lap band surgery  2009   left inguinal hernia repair  1998   LIPOMA EXCISION Left 04/17/2023   Procedure: MINOR EXCISION LEFT BUTTOCK SEBACEOUS CYST;  Surgeon: Lyndel Deward PARAS, MD;  Location: [facility];  Service: General;  Laterality: Left;   robotic prostatectomy  2008   TRANSESOPHAGEAL ECHOCARDIOGRAM (CATH LAB) N/A 11/18/2024   Procedure: TRANSESOPHAGEAL ECHOCARDIOGRAM;  Surgeon: Santo Stanly LABOR, MD;  Location: [facility] INVASIVE CV LAB;  Service: Cardiovascular;  Laterality: N/A;    Family History  Problem Relation Age of Onset   Heart failure Mother    Hypertension Father        deceased age 45   Heart attack Father    COPD Sister    CVA Sister    Diabetes Daughter    Colon cancer Neg Hx    Stomach cancer Neg Hx    Colon polyps Neg Hx    Esophageal cancer Neg Hx    Rectal cancer Neg Hx     Social History:  reports that he has never smoked. He has never used smokeless tobacco. He reports current alcohol use. He reports that he does not use drugs.   Allergies: Allergies[1]   Medications Prior to Admission  Medication Sig Dispense Refill   acetaminophen  (TYLENOL ) 500 MG tablet Take 500 mg by mouth every 6 (six) hours as needed for mild pain.     amLODipine  (NORVASC ) 5 MG tablet Take 1 tablet (5 mg total) by mouth daily. 90 tablet 3   bimekizumab-bkzx (BIMZELX) 160 MG/ML prefilled syringe Inject 1 Syringe into the skin every 30 (thirty) days.     carvedilol  (COREG ) 12.5 MG tablet Take 1 tablet (12.5 mg total) by mouth 2 (two) times daily with a meal. Follow your blood pressure outpatient and get refills and adjustments with your PCP outpatient 60 tablet 0   dorzolamide  (TRUSOPT ) 2 % ophthalmic solution Place 1 drop into both eyes daily.     insulin  glargine (LANTUS  SOLOSTAR) 100 UNIT/ML Solostar Pen Inject 15 Units into the skin daily. Follow up with your PCP outpatient for further adjustment to your regimen (Patient taking differently: Inject 20 Units into the skin daily. Follow up with your PCP outpatient for further adjustment to your regimen)     insulin  lispro (HUMALOG  KWIKPEN) 200 UNIT/ML KwikPen Inject 0-15 Units into the skin 3 (three) times daily with meals. CBG  < 70: treat low blood sugar CBG 70 - 120: 0 units  CBG 121 - 150: 2 units  CBG 151 - 200: 3 units  CBG 201 - 250: 5 units  CBG 251 - 300: 8 units  CBG 301 - 350: 11 units  CBG 351 - 400: 15 units  CBG >400: Call MD     levothyroxine  (SYNTHROID ) 125 MCG tablet Take 1 tablet (125 mcg total) by mouth every morning on an empty stomach 30 tablet 11   simvastatin  (ZOCOR ) 20 MG tablet Take 1 tablet (20 mg total) by mouth every evening. 90 tablet 3   tirzepatide  (MOUNJARO ) 15 MG/0.5ML Pen Inject 15 mg into the skin once a week. 6 mL 4   torsemide  (DEMADEX ) 100 MG tablet Take 1 tablet (100 mg total) by mouth in the morning  except on dialysis days (Patient taking differently: Take 100 mg by mouth See admin instructions. Take one tablet by mouth on Mondays, Tuesdays, Thursdays and Fridays only per patient) 30 tablet 3   clobetasol  ointment (TEMOVATE ) 0.05 % Apply topically daily before sleeping. (Patient not taking: Reported on 08/29/2024) 60 g 1   Continuous Glucose Sensor (FREESTYLE LIBRE 3 PLUS SENSOR) MISC Apply to upper back of arm. Change every 15 days. 2 each 12   Continuous Glucose Sensor (FREESTYLE LIBRE 3 SENSOR) MISC Apply to upper back of arm and change ever 14 days. 6 each 3   Insulin  Pen Needle (TECHLITE PEN NEEDLES) 32G X 4 MM MISC Use to inject insulin  4 times daily 400 each 4   Insulin  Pen Needle 32G X 4 MM MISC Use to inject insulin  up to 4 times daily. 400 each 4   timolol  (BETIMOL ) 0.5 % ophthalmic solution Place 1 drop into both eyes daily.        Home: Home Living Family/patient expects to be discharged to:: Private residence Living Arrangements: Spouse/significant other Available Help at Discharge: Family, Available PRN/intermittently Type of Home: House Home Access: Stairs to enter [employer] of Steps: 4 Entrance Stairs-Rails: Right, Left, Can reach both Home Layout: One level Bathroom Shower/Tub: Health Visitor: Handicapped height (has  BSC) Bathroom Accessibility: Yes Home Equipment: Cane - single point, Hand held shower head, Rolling Walker (2 wheels), BSC/3in1, Rollator (4 wheels) Additional Comments: Pt lives with wife who works during the day. Wife takes him to HD before going to work and then picks him up during her lunch hour and takes him home.   Functional History: Prior Function Prior Level of Function : Independent/Modified Independent Mobility Comments: Mod I, reports primarily use of rollator to walk ADLs Comments: Reports previously mod I, however wife has been assisting with LB ADLs since 12/16 admission  Functional Status:  Mobility: Bed Mobility Overal bed mobility: Needs Assistance Bed Mobility: Supine to Sit Supine to sit: Supervision Sit to supine: Min assist General bed mobility comments: CGA, HOB elevated, increased time due to fatigue/weakness Transfers Overall transfer level: Needs assistance Equipment used: Rolling walker (2 wheels) Transfers: Sit to/from Stand Sit to Stand: Contact guard assist, From elevated surface General transfer comment: CGA from elevated surface Ambulation/Gait Ambulation/Gait assistance: Min assist Gait Distance (Feet): 20 Feet (additional trial of 15') Assistive device: Rolling walker (2 wheels) Gait Pattern/deviations: Step-to pattern General Gait Details: slowed step-to gait, reduced gait speed and increased time to change direction Gait velocity: reduced Gait velocity interpretation: <1.8 ft/sec, indicate of risk for recurrent falls    ADL: ADL Overall ADL's : Needs assistance/impaired Eating/Feeding: Set up, Sitting Grooming: Set up, Sitting Upper Body Bathing: Set up, Sitting Lower Body Bathing: Moderate assistance, Sit to/from stand Upper Body Dressing : Set up, Sitting Lower Body Dressing: Moderate assistance, Sit to/from stand Toilet Transfer: Moderate assistance, Ambulation, Rolling walker (2 wheels) Toilet Transfer Details (indicate cue type  and reason): ~54ft limited activity tolerance Functional mobility during ADLs: Moderate assistance, Rolling walker (2 wheels) General ADL Comments: Limited by decreased activity tolerance and strength  Cognition: Cognition Orientation Level: Oriented X4 [place] [employer] Scales of Cognitive Functioning: Purposeful, Appropriate: Stand-by Assistance Cognition Arousal: Alert Behavior During Therapy: Flat affect  Physical Exam: Blood pressure (!) 129/55, pulse 67, temperature 98.8 F (37.1 C), temperature source Oral, resp. rate 20, height 6' 2 (1.88 m), weight 125 kg, SpO2 97%. Physical Exam Vitals and nursing note reviewed.  Constitutional:  General: He is not in acute distress.    Appearance: Normal appearance. He is obese. He is not ill-appearing.     Comments: Flat affect  HENT:     Head: Normocephalic and atraumatic.     Right Ear: External ear normal.     Left Ear: External ear normal.     Nose: Nose normal.     Mouth/Throat:     Mouth: Mucous membranes are moist.  Eyes:     Extraocular Movements: Extraocular movements intact.     Pupils: Pupils are equal, round, and reactive to light.  Cardiovascular:     Rate and Rhythm: Normal rate and regular rhythm.     Comments: Scabbed areas on Right upper chest wall at site of prior internal jugular. Pulmonary:     Effort: Pulmonary effort is normal. No respiratory distress.     Breath sounds: No wheezing or rhonchi.  Abdominal:     General: Bowel sounds are normal. There is no distension.     Palpations: Abdomen is soft.     Comments: Incision mid abdomen with small induration area (button?)  Musculoskeletal:        General: Swelling present.     Cervical back: Normal range of motion.  Skin:    Comments: Psoriasis, flaky skin bilateral palms and feet. To a lesser extent there are findings on legs. Callus under right great toe. Tunneled dialysis cath left internal jugular. Perineal wound packed  Neurological:     Mental  Status: He is alert.     Comments: Oriented to self and place. Has difficulty with time line of recent admission. Soft voice and able to follow simple one step commands. Fair insight and awareness. Normal speech and language. No focal CN findings. MMT: BUE 4/5 prox to distal. BLE 4-HF, 4/5 KE and 4+ ADF/PF. Sensory exam normal for light touch and pain in all 4 limbs. No limb ataxia or cerebellar signs. No abnormal tone appreciated.    Psychiatric:     Comments: Flat but generally appropriate     Results for orders placed or performed during the hospital encounter of 11/11/24 (from the past 48 hours)  CBC     Status: Abnormal   Collection Time: 11/21/24  3:10 PM  Result Value Ref Range   WBC 32.8 (H) 4.0 - 10.5 K/uL   RBC 3.15 (L) 4.22 - 5.81 MIL/uL   Hemoglobin 8.9 (L) 13.0 - 17.0 g/dL   HCT 73.5 (L) 60.9 - 47.9 %   MCV 83.8 80.0 - 100.0 fL   MCH 28.3 26.0 - 34.0 pg   MCHC 33.7 30.0 - 36.0 g/dL   RDW 82.7 (H) 88.4 - 84.4 %   Platelets 273 150 - 400 K/uL   nRBC 0.0 0.0 - 0.2 %    Comment: Performed at [facility], [address] [place], [place] [address]  Glucose, capillary     Status: Abnormal   Collection Time: 11/21/24  8:12 PM  Result Value Ref Range   Glucose-Capillary 166 (H) 70 - 99 mg/dL    Comment: Glucose reference range applies only to samples taken after fasting for at least 8 hours.  CBC with Differential/Platelet     Status: Abnormal   Collection Time: 11/22/24  6:00 AM  Result Value Ref Range   WBC 24.1 (H) 4.0 - 10.5 K/uL   RBC 3.02 (L) 4.22 - 5.81 MIL/uL   Hemoglobin 8.4 (L) 13.0 - 17.0 g/dL   HCT 74.9 (L) 60.9 -  52.0 %   MCV 82.8 80.0 - 100.0 fL   MCH 27.8 26.0 - 34.0 pg   MCHC 33.6 30.0 - 36.0 g/dL   RDW 82.9 (H) 88.4 - 84.4 %   Platelets 241 150 - 400 K/uL   nRBC 0.0 0.0 - 0.2 %   Neutrophils Relative % 79 %   Neutro Abs 19.3 (H) 1.7 - 7.7 K/uL   Lymphocytes Relative 8 %   Lymphs Abs 1.8 0.7 - 4.0 K/uL   Monocytes Relative 8 %   Monocytes  Absolute 1.9 (H) 0.1 - 1.0 K/uL   Eosinophils Relative 2 %   Eosinophils Absolute 0.4 0.0 - 0.5 K/uL   Basophils Relative 1 %   Basophils Absolute 0.2 (H) 0.0 - 0.1 K/uL   Immature Granulocytes 2 %   Abs Immature Granulocytes 0.44 (H) 0.00 - 0.07 K/uL    Comment: Performed at [facility], [address] [place], [place] [address]  Hepatic function panel     Status: Abnormal   Collection Time: 11/22/24  6:00 AM  Result Value Ref Range   Total Protein 6.2 (L) 6.5 - 8.1 g/dL   Albumin  2.4 (L) 3.5 - 5.0 g/dL   AST 12 (L) 15 - 41 U/L   ALT <5 0 - 44 U/L   Alkaline Phosphatase 101 38 - 126 U/L   Total Bilirubin 0.6 0.0 - 1.2 mg/dL   Bilirubin, Direct 0.2 0.0 - 0.2 mg/dL   Indirect Bilirubin 0.3 0.3 - 0.9 mg/dL    Comment: Performed at [facility], [address], [place], [place] [address]  Renal function panel     Status: Abnormal   Collection Time: 11/22/24  6:01 AM  Result Value Ref Range   Sodium 131 (L) 135 - 145 mmol/L   Potassium 4.2 3.5 - 5.1 mmol/L   Chloride 95 (L) 98 - 111 mmol/L   CO2 25 22 - 32 mmol/L   Glucose, Bld 332 (H) 70 - 99 mg/dL    Comment: Glucose reference range applies only to samples taken after fasting for at least 8 hours.   BUN 24 (H) 8 - 23 mg/dL   Creatinine, Ser 5.89 (H) 0.61 - 1.24 mg/dL   Calcium 7.9 (L) 8.9 - 10.3 mg/dL   Phosphorus 4.3 2.5 - 4.6 mg/dL   Albumin  2.2 (L) 3.5 - 5.0 g/dL   GFR, Estimated 15 (L) >60 mL/min    Comment: (NOTE) Calculated using the CKD-EPI Creatinine Equation (2021)    Anion gap 11 5 - 15    Comment: Performed at [facility], [address] [place], [place] [address]  Glucose, capillary     Status: Abnormal   Collection Time: 11/22/24  7:37 AM  Result Value Ref Range   Glucose-Capillary 313 (H) 70 - 99 mg/dL    Comment: Glucose reference range applies only to samples taken after fasting for at least 8 hours.  Glucose, capillary     Status: Abnormal   Collection Time: 11/22/24 11:55 AM  Result  Value Ref Range   Glucose-Capillary 230 (H) 70 - 99 mg/dL    Comment: Glucose reference range applies only to samples taken after fasting for at least 8 hours.  Glucose, capillary     Status: Abnormal   Collection Time: 11/22/24  3:39 PM  Result Value Ref Range   Glucose-Capillary 225 (H) 70 - 99 mg/dL    Comment: Glucose reference range applies only to samples taken after fasting for  at least 8 hours.  Glucose, capillary     Status: Abnormal   Collection Time: 11/22/24  8:13 PM  Result Value Ref Range   Glucose-Capillary 240 (H) 70 - 99 mg/dL    Comment: Glucose reference range applies only to samples taken after fasting for at least 8 hours.  Renal function panel     Status: Abnormal   Collection Time: 11/23/24  4:31 AM  Result Value Ref Range   Sodium 130 (L) 135 - 145 mmol/L   Potassium 4.0 3.5 - 5.1 mmol/L   Chloride 94 (L) 98 - 111 mmol/L   CO2 25 22 - 32 mmol/L   Glucose, Bld 175 (H) 70 - 99 mg/dL    Comment: Glucose reference range applies only to samples taken after fasting for at least 8 hours.   BUN 29 (H) 8 - 23 mg/dL   Creatinine, Ser 4.79 (H) 0.61 - 1.24 mg/dL   Calcium 8.2 (L) 8.9 - 10.3 mg/dL   Phosphorus 4.4 2.5 - 4.6 mg/dL   Albumin  2.2 (L) 3.5 - 5.0 g/dL   GFR, Estimated 11 (L) >60 mL/min    Comment: (NOTE) Calculated using the CKD-EPI Creatinine Equation (2021)    Anion gap 11 5 - 15    Comment: Performed at [facility], [address] [place], [place] [address]  CBC with Differential/Platelet     Status: Abnormal   Collection Time: 11/23/24  4:31 AM  Result Value Ref Range   WBC 21.8 (H) 4.0 - 10.5 K/uL   RBC 2.93 (L) 4.22 - 5.81 MIL/uL   Hemoglobin 8.1 (L) 13.0 - 17.0 g/dL   HCT 75.4 (L) 60.9 - 47.9 %   MCV 83.6 80.0 - 100.0 fL   MCH 27.6 26.0 - 34.0 pg   MCHC 33.1 30.0 - 36.0 g/dL   RDW 83.0 (H) 88.4 - 84.4 %   Platelets 220 150 - 400 K/uL   nRBC 0.0 0.0 - 0.2 %   Neutrophils Relative % 86 %   Neutro Abs 19.0 (H) 1.7 - 7.7 K/uL    Lymphocytes Relative 5 %   Lymphs Abs 1.1 0.7 - 4.0 K/uL   Monocytes Relative 4 %   Monocytes Absolute 0.9 0.1 - 1.0 K/uL   Eosinophils Relative 2 %   Eosinophils Absolute 0.4 0.0 - 0.5 K/uL   Basophils Relative 1 %   Basophils Absolute 0.1 0.0 - 0.1 K/uL   Immature Granulocytes 2 %   Abs Immature Granulocytes 0.33 (H) 0.00 - 0.07 K/uL    Comment: Performed at [facility], [address] [place], [place] [address]  Glucose, capillary     Status: Abnormal   Collection Time: 11/23/24  8:20 AM  Result Value Ref Range   Glucose-Capillary 193 (H) 70 - 99 mg/dL    Comment: Glucose reference range applies only to samples taken after fasting for at least 8 hours.  Glucose, capillary     Status: Abnormal   Collection Time: 11/23/24 12:41 PM  Result Value Ref Range   Glucose-Capillary 179 (H) 70 - 99 mg/dL    Comment: Glucose reference range applies only to samples taken after fasting for at least 8 hours.   No results found.    Blood pressure (!) 129/55, pulse 67, temperature 98.8 F (37.1 C), temperature source Oral, resp. rate 20, height 6' 2 (1.88 m), weight 125 kg, SpO2 97%.  Medical Problem List and Plan: 1. Functional deficits secondary to debility due to sepsis,  subacute TBI  -patient may  shower  -ELOS/Goals: 10-12 days, supervision to mod I goals 2.  Antithrombotics: -DVT/anticoagulation:  Pharmaceutical: Heparin   -antiplatelet therapy: N/A 3. Pain Management: Tylenol  prn.  4. Mood/Behavior/Sleep: LCSW to follow for evaluation and support.   -antipsychotic agents: N/A 5. Neuropsych/cognition: This patient may be capable of making decisions on his own behalf. 6. Skin/Wound Care: Routine pressure relief measures  -eucerin cream to bilateral hands and feet bid, pt states he's been dealing with it for several months. ?psoriasis 7. Fluids/Electrolytes/Nutrition: Strict I/O. Labs with HD 8. MSSA/ Proteus mirabilis bacteremia: Now on Cefazolin  for 4 total weeks. Levaquin   added on 01/04 for additional week 9.  T2DM: Hgb A1C- 8.4%. Monitor BS ac/hs and use SSI for elevated BS  --continue Lantus  increased to 25 units on 01/05 and on 5 units TID for meal coverage.   -glucose control is gradually improving 10. HTN: BP has been on low side likely due to sepsis.  --On coreg  6.25 mg bid. Continue to hold Norvasc .  11. Fournier's gangrene s/p I & D 08/2024: Continue to pack wound bid.  12. Anemia of chronic disease: On weekly aranesp . Continue to monitor H/H. 13.  OSA: Continue CPAP 14. Constipation: On Miralax  and senna but refusing second dose-->will simplify regimen. 15. TBI w/SDH: Reports occipital HA, blurry vision and dizziness once up.   --needs vestibular evaluation      Sharlet GORMAN Schmitz, PA-C 11/23/2024     [1]  Allergies Allergen Reactions   Ace Inhibitors Cough   Maxidex  [Dexamethasone ] Other (See Comments)    Sexual dysfunction   Verapamil Other (See Comments)    constipation   "

## 2024-11-24 DIAGNOSIS — S069XAS Unspecified intracranial injury with loss of consciousness status unknown, sequela: Secondary | ICD-10-CM

## 2024-11-24 LAB — CBC WITH DIFFERENTIAL/PLATELET
Abs Immature Granulocytes: 0.22 K/uL — ABNORMAL HIGH (ref 0.00–0.07)
Basophils Absolute: 0.1 K/uL (ref 0.0–0.1)
Basophils Relative: 0 %
Eosinophils Absolute: 0.2 K/uL (ref 0.0–0.5)
Eosinophils Relative: 1 %
HCT: 26.3 % — ABNORMAL LOW (ref 39.0–52.0)
Hemoglobin: 8.8 g/dL — ABNORMAL LOW (ref 13.0–17.0)
Immature Granulocytes: 1 %
Lymphocytes Relative: 4 %
Lymphs Abs: 0.9 K/uL (ref 0.7–4.0)
MCH: 27.7 pg (ref 26.0–34.0)
MCHC: 33.5 g/dL (ref 30.0–36.0)
MCV: 82.7 fL (ref 80.0–100.0)
Monocytes Absolute: 1.6 K/uL — ABNORMAL HIGH (ref 0.1–1.0)
Monocytes Relative: 7 %
Neutro Abs: 20.7 K/uL — ABNORMAL HIGH (ref 1.7–7.7)
Neutrophils Relative %: 87 %
Platelets: 226 K/uL (ref 150–400)
RBC: 3.18 MIL/uL — ABNORMAL LOW (ref 4.22–5.81)
RDW: 16.8 % — ABNORMAL HIGH (ref 11.5–15.5)
WBC: 23.7 K/uL — ABNORMAL HIGH (ref 4.0–10.5)
nRBC: 0 % (ref 0.0–0.2)

## 2024-11-24 LAB — RENAL FUNCTION PANEL
Albumin: 2.5 g/dL — ABNORMAL LOW (ref 3.5–5.0)
Anion gap: 12 (ref 5–15)
BUN: 39 mg/dL — ABNORMAL HIGH (ref 8–23)
CO2: 25 mmol/L (ref 22–32)
Calcium: 8.3 mg/dL — ABNORMAL LOW (ref 8.9–10.3)
Chloride: 93 mmol/L — ABNORMAL LOW (ref 98–111)
Creatinine, Ser: 6.73 mg/dL — ABNORMAL HIGH (ref 0.61–1.24)
GFR, Estimated: 8 mL/min — ABNORMAL LOW
Glucose, Bld: 232 mg/dL — ABNORMAL HIGH (ref 70–99)
Phosphorus: 4.9 mg/dL — ABNORMAL HIGH (ref 2.5–4.6)
Potassium: 4.3 mmol/L (ref 3.5–5.1)
Sodium: 130 mmol/L — ABNORMAL LOW (ref 135–145)

## 2024-11-24 LAB — GLUCOSE, CAPILLARY
Glucose-Capillary: 229 mg/dL — ABNORMAL HIGH (ref 70–99)
Glucose-Capillary: 217 mg/dL — ABNORMAL HIGH (ref 70–99)
Glucose-Capillary: 134 mg/dL — ABNORMAL HIGH (ref 70–99)
Glucose-Capillary: 207 mg/dL — ABNORMAL HIGH (ref 70–99)

## 2024-11-24 MED ORDER — MECLIZINE HCL 25 MG PO TABS
12.5000 mg | ORAL_TABLET | Freq: Three times a day (TID) | ORAL | Status: DC | PRN
  Administered 2024-11-25 – 2024-12-01 (×7): 12.5 mg via ORAL
  Filled 2024-11-24 (×7): qty 1

## 2024-11-24 MED ORDER — HEPARIN SODIUM (PORCINE) 1000 UNIT/ML IJ SOLN
4600.0000 [IU] | Freq: Once | INTRAMUSCULAR | Status: AC
  Administered 2024-11-24: 4600 [IU]

## 2024-11-24 MED ORDER — HEPARIN SODIUM (PORCINE) 1000 UNIT/ML DIALYSIS: 1000.0000 [IU] | INTRAMUSCULAR | Status: DC | PRN

## 2024-11-24 MED ORDER — MELATONIN 5 MG PO TABS: 5.0000 mg | ORAL_TABLET | Freq: Every evening | ORAL | Status: DC | PRN

## 2024-11-24 MED ORDER — ANTICOAGULANT SODIUM CITRATE 4% (200MG/5ML) IV SOLN: 5.0000 mL | Status: DC | PRN

## 2024-11-24 MED ORDER — HEPARIN SODIUM (PORCINE) 1000 UNIT/ML IJ SOLN
INTRAMUSCULAR | Status: AC
  Filled 2024-11-24: qty 9

## 2024-11-24 MED ORDER — PENTAFLUOROPROP-TETRAFLUOROETH EX AERO: 1.0000 | INHALATION_SPRAY | CUTANEOUS | Status: DC | PRN

## 2024-11-24 MED ORDER — HEPARIN SODIUM (PORCINE) 1000 UNIT/ML IJ SOLN
3500.0000 [IU] | Freq: Once | INTRAMUSCULAR | Status: AC
  Administered 2024-11-26: 3500 [IU] via INTRAVENOUS

## 2024-11-24 MED ORDER — ALTEPLASE 2 MG IJ SOLR: 2.0000 mg | Freq: Once | INTRAMUSCULAR | Status: DC | PRN

## 2024-11-24 MED ORDER — LIDOCAINE HCL (PF) 1 % IJ SOLN: 5.0000 mL | INTRAMUSCULAR | Status: DC | PRN

## 2024-11-24 MED ORDER — LIDOCAINE-PRILOCAINE 2.5-2.5 % EX CREA: 1.0000 | TOPICAL_CREAM | CUTANEOUS | Status: DC | PRN

## 2024-11-24 MED ORDER — CHLORHEXIDINE GLUCONATE CLOTH 2 % EX PADS
6.0000 | MEDICATED_PAD | Freq: Every day | CUTANEOUS | Status: DC
  Administered 2024-11-24: 6 via TOPICAL

## 2024-11-24 MED ORDER — SCOPOLAMINE 1 MG/3DAYS TD PT72
1.0000 | MEDICATED_PATCH | TRANSDERMAL | Status: DC
  Administered 2024-11-24: 1 mg via TRANSDERMAL
  Filled 2024-11-24: qty 1

## 2024-11-24 MED ORDER — AQUAPHOR EX OINT: TOPICAL_OINTMENT | Freq: Two times a day (BID) | CUTANEOUS | Status: DC

## 2024-11-24 NOTE — Plan of Care (Signed)
" °  Problem: RH Balance Goal: LTG Patient will maintain dynamic standing with ADLs (OT) Description: LTG:  Patient will maintain dynamic standing balance with assist during activities of daily living (OT)  Flowsheets (Taken 11/24/2024 1134) LTG: Pt will maintain dynamic standing balance during ADLs with: Independent with assistive device   Problem: Sit to Stand Goal: LTG:  Patient will perform sit to stand in prep for activites of daily living with assistance level (OT) Description: LTG:  Patient will perform sit to stand in prep for activites of daily living with assistance level (OT) Flowsheets (Taken 11/24/2024 1134) LTG: PT will perform sit to stand in prep for activites of daily living with assistance level: Independent with assistive device   Problem: RH Grooming Goal: LTG Patient will perform grooming w/assist,cues/equip (OT) Description: LTG: Patient will perform grooming with assist, with/without cues using equipment (OT) Flowsheets (Taken 11/24/2024 1134) LTG: Pt will perform grooming with assistance level of: Independent with assistive device    Problem: RH Bathing Goal: LTG Patient will bathe all body parts with assist levels (OT) Description: LTG: Patient will bathe all body parts with assist levels (OT) Flowsheets (Taken 11/24/2024 1134) LTG: Pt will perform bathing with assistance level/cueing: Supervision/Verbal cueing LTG: Position pt will perform bathing: At sink   Problem: RH Dressing Goal: LTG Patient will perform upper body dressing (OT) Description: LTG Patient will perform upper body dressing with assist, with/without cues (OT). Flowsheets (Taken 11/24/2024 1134) LTG: Pt will perform upper body dressing with assistance level of: Set up assist Goal: LTG Patient will perform lower body dressing w/assist (OT) Description: LTG: Patient will perform lower body dressing with assist, with/without cues in positioning using equipment (OT) Flowsheets (Taken 11/24/2024 1134) LTG: Pt  will perform lower body dressing with assistance level of: Set up assist   Problem: RH Toileting Goal: LTG Patient will perform toileting task (3/3 steps) with assistance level (OT) Description: LTG: Patient will perform toileting task (3/3 steps) with assistance level (OT)  Flowsheets (Taken 11/24/2024 1134) LTG: Pt will perform toileting task (3/3 steps) with assistance level: Independent with assistive device   Problem: RH Simple Meal Prep Goal: LTG Patient will perform simple meal prep w/assist (OT) Description: LTG: Patient will perform simple meal prep with assistance, with/without cues (OT). Flowsheets (Taken 11/24/2024 1134) LTG: Pt will perform simple meal prep with assistance level of: Supervision/Verbal cueing LTG: Pt will perform simple meal prep w/level of: Ambulate with device   Problem: RH Toilet Transfers Goal: LTG Patient will perform toilet transfers w/assist (OT) Description: LTG: Patient will perform toilet transfers with assist, with/without cues using equipment (OT) Flowsheets (Taken 11/24/2024 1134) LTG: Pt will perform toilet transfers with assistance level of: Independent with assistive device   Problem: RH Tub/Shower Transfers Goal: LTG Patient will perform tub/shower transfers w/assist (OT) Description: LTG: Patient will perform tub/shower transfers with assist, with/without cues using equipment (OT) Flowsheets (Taken 11/24/2024 1134) LTG: Pt will perform tub/shower stall transfers with assistance level of: Supervision/Verbal cueing LTG: Pt will perform tub/shower transfers from: Walk in shower   "

## 2024-11-24 NOTE — Progress Notes (Addendum)
 "                                                        PROGRESS NOTE   Subjective/Complaints:  No events overnight.  Complaining of severe dizziness this a.m., worse when sitting up and moving his head and eyes in any direction.  Does resolve a little bit with sustained sitting.  Denies feeling like he is going to pass out.  Describes it as the room spinning and feeling like he is on a ship.  Vitals stable      11/24/2024    5:19 AM 11/23/2024    8:13 PM 11/23/2024    3:42 PM  Vitals with BMI  Height   6' 2  Weight   271 lbs 3 oz  BMI   34.8  Systolic 133 130 867  Diastolic 63 66 64  Pulse 64 66 57    Recent Labs    11/23/24 1544 11/23/24 2135 11/24/24 0629  GLUCAP 173* 174* 229*  Blood sugar somewhat elevated 170s to 220s AM labs significant for stable hyponatremia 131, stable leukocytosis 21.5, stable anemia 8.3 P.o. intakes appropriate   Continent of bladder  --he makes a small amount of urine and states at baseline that he is incontinent with this Last BM 11/23/24, large   ROS: Denies fevers, chills, N/V, abdominal pain, constipation, diarrhea, SOB, cough, chest pain, new weakness or paraesthesias.   + Vertigo/dizziness   Objective:   No results found. Recent Labs    11/23/24 0431 11/23/24 1549  WBC 21.8* 21.5*  HGB 8.1* 8.3*  HCT 24.5* 25.1*  PLT 220 233   Recent Labs    11/23/24 0431 11/23/24 1549  NA 130* 131*  K 4.0 3.9  CL 94* 94*  CO2 25 27  GLUCOSE 175* 179*  BUN 29* 32*  CREATININE 5.20* 5.91*  CALCIUM 8.2* 8.2*    Intake/Output Summary (Last 24 hours) at 11/24/2024 0926 Last data filed at 11/24/2024 0700 Gross per 24 hour  Intake 236 ml  Output --  Net 236 ml        Physical Exam: Vital Signs Blood pressure 133/63, pulse 64, temperature 98.9 F (37.2 C), temperature source Oral, resp. rate 18, height 6' 2 (1.88 m), weight 123 kg, SpO2 99%. Constitutional: No apparent distress. Appropriate appearance for age.  Sitting up at  bedside. HENT: No JVD. Neck Supple. Trachea midline. Atraumatic, normocephalic. Eyes: PERRLA. EOMI. Visual fields grossly intact.  No notable nystagmus with EOMI.  Cardiovascular: RRR, no murmurs/rub/gallops.1+ BL LE Edema. Peripheral pulses 1+  Respiratory: CTAB. No rales, rhonchi, or wheezing. On RA.  Abdomen: + bowel sounds, normoactive. No distention or tenderness.  Skin:   Scabbed areas on Right upper chest wall at site of prior internal jugular.  Psoriasis, flaky skin bilateral palms and feet. To a lesser extent there are findings on legs.  No apparent wounds between toes Callus under right great toe.  Tunneled dialysis cath left internal jugular.  Perineal wound packed--unable to examine today due to positioning/clothing     MSK:  No apparent deformity  Neurologic exam:  Alert and oriented x 3.  Assessment limited by severe vertigo. Good recall of recent events, but flat and fairly self reserved complicating cognitive assessment Fair insight and awareness Normal speech and language. Cranial nerves II through  XII intact 4-5 strength distal and proximal bilateral upper and lower extremities. Peripheral neuropathy in bilateral feet distal to the ankle worse on the left No tone Reflexes intact No ataxia   Assessment/Plan: 1. Functional deficits which require 3+ hours per day of interdisciplinary therapy in a comprehensive inpatient rehab setting. Physiatrist is providing close team supervision and 24 hour management of active medical problems listed below. Physiatrist and rehab team continue to assess barriers to discharge/monitor patient progress toward functional and medical goals  Care Tool:  Bathing              Bathing assist       Upper Body Dressing/Undressing Upper body dressing        Upper body assist      Lower Body Dressing/Undressing Lower body dressing            Lower body assist       Toileting Toileting    Toileting assist        Transfers Chair/bed transfer  Transfers assist           Locomotion Ambulation   Ambulation assist              Walk 10 feet activity   Assist           Walk 50 feet activity   Assist           Walk 150 feet activity   Assist           Walk 10 feet on uneven surface  activity   Assist           Wheelchair     Assist               Wheelchair 50 feet with 2 turns activity    Assist            Wheelchair 150 feet activity     Assist          Blood pressure 133/63, pulse 64, temperature 98.9 F (37.2 C), temperature source Oral, resp. rate 18, height 6' 2 (1.88 m), weight 123 kg, SpO2 99%.   Medical Problem List and Plan: 1. Functional deficits secondary to debility due subacute TBI with SDH s/p fall             -patient may  shower             -ELOS/Goals: 10-12 days, supervision to mod I goals  -Stable to continue CIR  2.  Antithrombotics: -DVT/anticoagulation:  Pharmaceutical: Heparin              -antiplatelet therapy: N/A  3. Pain Management: Tylenol  prn.  4. Mood/Behavior/Sleep: LCSW to follow for evaluation and support.              -antipsychotic agents: N/A             -pt struggling to sleep d/t visits by staff. Try to limit HS interruptions while on rehab. He denies that pain or his CPAP machine are contributing to problem.              -check sleep chart     16: Add as needed melatonin 5 mg QHS  5. Neuropsych/cognition: This patient may be capable of making decisions on his own behalf.   - 1/6: Cog eval limiting - patient with limited participation and dizziness - SLP not noting true cog deficits so not picking up for ongoing therapies  6. Skin/Wound Care: Routine pressure relief measures             -  eucerin cream to bilateral hands and feet bid, pt states he's been dealing with it for several months. ?psoriasis   - R toe abrasion   - Wound care in place for scrotum/peroneal  wound  7. Fluids/Electrolytes/Nutrition: Strict I/O. Labs with HD   - AM labs with stable hyponatremia; monitor w/ dialysis  8. MSSA/ Proteus mirabilis bacteremia: Now on Cefazolin  for 4 total weeks.   - Levaquin  300 mg Q48H added on 01/04 for additional week  9.  T2DM: Hgb A1C- 8.4%. Monitor BS ac/hs and use SSI for elevated BS             --continue Lantus  increased to 25 units on 01/05 and on 5 units TID for meal coverage.              -glucose control is gradually improving  1/6: Monitor BG with increased activity  Recent Labs    11/23/24 1544 11/23/24 2135 11/24/24 0629  GLUCAP 173* 174* 229*      10. HTN: BP has been on low side likely due to sepsis.  --On coreg  6.25 mg bid. Continue to hold Norvasc .  -1-6 stable--monitor closely and get orthostatics in a.m.    11/24/2024    5:19 AM 11/23/2024    8:13 PM 11/23/2024    3:42 PM  Vitals with BMI  Height   6' 2  Weight   271 lbs 3 oz  BMI   34.8  Systolic 133 130 867  Diastolic 63 66 64  Pulse 64 66 57    11. Fournier's gangrene s/p I & D 08/2024: Continue to pack wound bid.   12. Anemia of chronic disease: On weekly aranesp . Continue to monitor H/H.  13.  OSA: Continue CPAP  14. Constipation: On Miralax  and senna but refusing second dose-->will simplify regimen.   - LBM 1/5, large  15. ESRD. Dialysis T TH Sat.   - Makes small amount of urine, incontinent at baseline  16. Vertigo/Dizziness -likely central vertigo but may be complicated by orthostasis -- Reports occipital HA, blurry vision and dizziness once up-vestibular eval pending - 1/6 Add PRN meclizine  12.5 mg TID, scopolamine  patch  LOS: 1 days A FACE TO FACE EVALUATION WAS PERFORMED  Brendan [name] 11/24/2024, 9:26 AM     "

## 2024-11-24 NOTE — Progress Notes (Signed)
" °   11/24/24 1848  Vitals  Temp 98 F (36.7 C)  Temp Source Oral  BP (!) 140/64  Pulse Rate (!) 101  Resp 15  Weight 123.8 kg  Type of Weight Post-Dialysis  Oxygen Therapy  SpO2 100 %  O2 Device Room Air  Patient Activity (if Appropriate) In bed  Pulse Oximetry Type Continuous  Oximetry Probe Site Changed No  Post Treatment  Dialyzer Clearance Lightly streaked  Hemodialysis Intake (mL) 0 mL  Liters Processed 84  Fluid Removed (mL) 2000 mL  Tolerated HD Treatment Yes  AVG/AVF Arterial Site Held (minutes) 0 minutes  AVG/AVF Venous Site Held (minutes) 0 minutes  Hemodialysis Catheter Left Internal jugular Double lumen Permanent (Tunneled)  Placement Date/Time: 11/17/24 1416   Serial / Lot #: [id number]  Expiration Date: 07/24/27  Time Out: Correct patient;Correct site;Correct procedure  Maximum sterile barrier precautions: Hand hygiene;Sterile probe cover;Cap;Mask;Sterile gown;Sterile g...  Site Condition No complications  Blue Lumen Status Flushed;Heparin  locked;Antimicrobial dead end cap  Red Lumen Status Flushed;Heparin  locked;Antimicrobial dead end cap  Purple Lumen Status N/A  Catheter fill solution Heparin  1000 units/ml  Catheter fill volume (Arterial) 2.3 cc  Catheter fill volume (Venous) 2.3  Dressing Type Transparent  Dressing Status Antimicrobial disc/dressing in place;Clean, Dry, Intact  Interventions Other (Comment) (Deaccessed.)  Drainage Description None  Dressing Change Due 11/25/24  Post treatment catheter status Capped and Clamped    "

## 2024-11-24 NOTE — Evaluation (Signed)
 Occupational Therapy Assessment and Plan  Patient Details  Name: Brendan [name]. MRN: [id number] Date of Birth: [birth year]  OT Diagnosis: muscle weakness (generalized) Rehab Potential: Rehab Potential (ACUTE ONLY): Good ELOS: 7-10   Today's Date: 11/24/2024 OT Individual Time: 1058 -1155         Hospital Problem: Principal Problem:   TBI (traumatic brain injury) (HCC) Active Problems:   Debility   Past Medical History:  Past Medical History:  Diagnosis Date   Acanthosis nigricans    Atopic dermatitis    CHF (congestive heart failure) (HCC)    CKD (chronic kidney disease) stage 4, GFR 15-29 ml/min (HCC) 01/08/2023   Diabetes (HCC) 11/19/2002   Erectile dysfunction    Fatty liver 11/19/2005   Glaucoma 11/19/2006   Gynecomastia 11/20/2007   Hx of adenomatous colonic polyps 08/26/2023   Hyperaldosteronism 11/19/1998   Hypercholesterolemia    Hyperlipidemia 2010   Hypertension 1999   Hypoglycemic reaction    Hypothyroidism 11/19/2002   Incontinence    Intermittent vertigo 11/19/2010   Left cervical radiculopathy 11/19/2010   Lumbar radiculopathy    Obesity    Peripheral neuropathy    Pollen allergies 11/19/2005   perennial   Prostate cancer (HCC) 11/19/2004   Reflux esophagitis 11/19/1993   Sickle cell trait 11/19/2004   Sleep apnea, obstructive 11/19/1998   uses a cpap   Venous insufficiency 11/20/2003   Vitamin D deficiency 11/19/2010   Past Surgical History:  Past Surgical History:  Procedure Laterality Date   COLONOSCOPY  2006   normal   INCISION AND DRAINAGE OF WOUND N/A 08/28/2024   Procedure: IRRIGATION AND EXCISIONAL DEBRIDEMENT WOUND;  Surgeon: Lyndel Deward PARAS, MD;  Location: [facility] OR;  Service: General;  Laterality: N/A;  EXCISIONAL DEBRIDEMENT   INCISION AND DRAINAGE PERIRECTAL ABSCESS N/A 08/30/2024   Procedure: INCISION AND DRAINAGE OF PERINEAL WOUND;  Surgeon: Polly Cordella LABOR, MD;  Location: [facility] OR;  Service: General;  Laterality: N/A;   IR  REMOVAL TUN CV CATH W/O FL  11/13/2024   IR TUNNELED CENTRAL VENOUS CATH PLC W IMG  09/18/2024   IR TUNNELED CENTRAL VENOUS CATH PLC W IMG  09/22/2024   IR TUNNELED CENTRAL VENOUS CATH PLC W IMG  11/17/2024   lap band surgery  2009   left inguinal hernia repair  1998   LIPOMA EXCISION Left 04/17/2023   Procedure: MINOR EXCISION LEFT BUTTOCK SEBACEOUS CYST;  Surgeon: Lyndel Deward PARAS, MD;  Location: [facility];  Service: General;  Laterality: Left;   robotic prostatectomy  2008   TRANSESOPHAGEAL ECHOCARDIOGRAM (CATH LAB) N/A 11/18/2024   Procedure: TRANSESOPHAGEAL ECHOCARDIOGRAM;  Surgeon: Santo Stanly LABOR, MD;  Location: [facility] IN[facility];  Service: Cardiovascular;  Laterality: N/A;    Assessment & Plan Clinical Impression: Patient is a 69 y.o. year old male with recent admission to the hospital on 11/03/24 where he struck his head and found to have right frontal meningioma, posterior scalp and prefrontal scalp hematoma and discharged to home on 12/18. He was readmitted on 11/11/24 with DKA, hypotension, malaise and fever 101.1. He was started on fluid and broad spectrum antibiotics for sepsis. He was found to have MSSA and Proteus bacteremia felt to be due to line infection. He was noted to be lethargic and CT head done revaling regressed frontal gyrus hematoms and new small bilateral SDH question post traumatic CSF hygromas or low density SDH.  Patient transferred to CIR on 11/23/2024 .    Patient currently requires min to mod  assist with basic self-care skills secondary to muscle weakness and central origin.  Prior to hospitalization, patient could complete ADLs  with modified independent .  Patient will benefit from skilled intervention to increase independence with basic self-care skills prior to discharge home with care partner.  Anticipate patient will require intermittent supervision and follow up home health.  OT - End of Session Activity Tolerance: Tolerates 10 -  20 min activity with multiple rests Endurance Deficit: Yes OT Assessment Rehab Potential (ACUTE ONLY): Good OT Barriers to Discharge: Hemodialysis;Wound Care OT Patient demonstrates impairments in the following area(s): Balance;Pain;Safety;Sensory;Endurance;Skin Integrity;Vision OT Basic ADL's Functional Problem(s): Grooming;Bathing;Dressing;Toileting OT Advanced ADL's Functional Problem(s): Simple Meal Preparation OT Transfers Functional Problem(s): Toilet;Tub/Shower OT Additional Impairment(s): Fuctional Use of Upper Extremity OT Plan OT Intensity: Minimum of 1-2 x/day, 45 to 90 minutes OT Frequency: 5 out of 7 days OT Duration/Estimated Length of Stay: 7-10 OT Treatment/Interventions: Balance/vestibular training;Discharge planning;Pain management;Self Care/advanced ADL retraining;Therapeutic Activities;UE/LE Coordination activities;Visual/perceptual remediation/compensation;Therapeutic Exercise;Skin care/wound managment;Patient/family education;Functional mobility training;Disease mangement/prevention;Cognitive remediation/compensation;Community reintegration;DME/adaptive equipment instruction;Psychosocial support;UE/LE Strength taining/ROM OT Self Feeding Anticipated Outcome(s): Modified Independent OT Basic Self-Care Anticipated Outcome(s): Modified Independent OT Toileting Anticipated Outcome(s): Modified Independent OT Bathroom Transfers Anticipated Outcome(s): Modified Independent/ supervision OT Recommendation Recommendations for Other Services: Vestibular eval Patient destination: Home Follow Up Recommendations: Home health OT Equipment Recommended: To be determined Equipment Details: possible need for shower chair   OT Evaluation Precautions/Restrictions  Precautions Precautions: Fall Recall of Precautions/Restrictions: Intact Precaution/Restrictions Comments: watch BP, HD fluid restriction Restrictions Weight Bearing Restrictions Per Provider Order: No Other  Position/Activity Restrictions: increased dizziness with transitions supine-sit-stand; not orthostatic on eval. General Chart Reviewed: Yes Family/Caregiver Present: No Vital Signs   Pain Pain Assessment Pain Scale: 0-10 Pain Score: 9  Pain Location: Head Pain Descriptors / Indicators: Aching Pain Onset: On-going Patients Stated Pain Goal: 0 Pain Intervention(s): Medication (See eMAR) PAINAD (Pain Assessment in Advanced Dementia) Breathing: normal Home Living/Prior Functioning Home Living Available Help at Discharge: Family, Available PRN/intermittently Type of Home: House Home Access: Stairs to enter [employer] of Steps: 4 Entrance Stairs-Rails: Right, Left, Can reach both Home Layout: One level Bathroom Shower/Tub: Health Visitor: Handicapped height Bathroom Accessibility: Yes Additional Comments: Pt lives with wife who works during the day. Wife takes him to HD before going to work and then picks him up during her lunch hour and takes him home.  Lives With: Spouse Prior Function Level of Independence: Independent with basic ADLs, Independent with transfers, Requires assistive device for independence  Able to Take Stairs?: Yes Driving: No Vocation: Unemployed Vision Baseline Vision/History: 0 No visual deficits Ability to See in Adequate Light: 0 Adequate Patient Visual Report: Blurring of vision Additional Comments: Patient with blurring vision with onset of dizziness, able to gain focus on objects at midline and track laterally without further blurring in specific fields of vision. No change in bp during onset of blurred double vision. Perception  Perception: Within Functional Limits Praxis Praxis: WFL Cognition Cognition Overall Cognitive Status: Impaired/Different from baseline Arousal/Alertness: Awake/alert Memory: Impaired Memory Impairment: Retrieval deficit Sustained Attention: Appears intact Awareness: Appears intact Problem  Solving: Appears intact Safety/Judgment: Appears intact Brief Interview for Mental Status (BIMS) Repetition of Three Words (First Attempt): 1 Temporal Orientation: Year: Correct Temporal Orientation: Month: Accurate within 5 days Temporal Orientation: Day: Correct Recall: Sock: Yes, after cueing (something to wear) Recall: Blue: Yes, no cue required Recall: Bed: Yes, after cueing (a piece of furniture) BIMS Summary Score: 11 Sensation Sensation Light Touch:  Impaired by gross assessment Hot/Cold: Appears Intact Proprioception: Appears Intact Stereognosis: Appears Intact Additional Comments: Fine touch mildly impaired; tingling but does not report prior dx of neuropathy at hands. reported neuropathy at feet Coordination Gross Motor Movements are Fluid and Coordinated: No Fine Motor Movements are Fluid and Coordinated: No Motor  Motor Motor - Skilled Clinical Observations: hesitant movements, but unable to assess if due to pain, dizziness or weakness.  Trunk/Postural Assessment  Postural Control Postural Control: Deficits on evaluation  Balance Balance Balance Assessed: Yes Dynamic Sitting Balance Dynamic Sitting - Balance Support: Feet supported Dynamic Sitting - Level of Assistance: 5: Stand by assistance Dynamic Sitting Balance - Compensations: patient with posterior lean intermittently due to dizziness Static Standing Balance Static Standing - Balance Support: Bilateral upper extremity supported Static Standing - Level of Assistance: 4: Min assist Dynamic Standing Balance Dynamic Standing - Balance Support: Bilateral upper extremity supported;During functional activity Dynamic Standing - Level of Assistance: 4: Min assist Dynamic Standing - Balance Activities: Reaching for objects Dynamic Standing - Comments: patient frequently needing cues to keep eyes open. Closing eyes due to dizziness. Extremity/Trunk Assessment RUE Assessment RUE Assessment: Within  Functional Limits General Strength Comments: mild imparements from baseline LUE Assessment LUE Assessment: Within Functional Limits General Strength Comments: mild impairment from baseline  Care Tool Care Tool Self Care Eating   Eating Assist Level: Set up assist    Oral Care    Oral Care Assist Level: Set up assist    Bathing   Body parts bathed by patient: Right arm;Left arm;Chest;Abdomen;Front perineal area;Right upper leg;Left upper leg;Face Body parts bathed by helper: Left lower leg;Right lower leg;Buttocks   Assist Level: Moderate Assistance - Patient 50 - 74%    Upper Body Dressing(including orthotics)   What is the patient wearing?: Pull over shirt   Assist Level: Set up assist    Lower Body Dressing (excluding footwear)   What is the patient wearing?: Pants Assist for lower body dressing: Maximal Assistance - Patient 25 - 49%    Putting on/Taking off footwear   What is the patient wearing?: Socks;Shoes Assist for footwear: Total Assistance - Patient < 25%       Care Tool Toileting Toileting activity   Assist for toileting: Moderate Assistance - Patient 50 - 74%     Care Tool Bed Mobility Roll left and right activity   Roll left and right assist level: Contact Guard/Touching assist    Sit to lying activity   Sit to lying assist level: Moderate Assistance - Patient 50 - 74%    Lying to sitting on side of bed activity   Lying to sitting on side of bed assist level: the ability to move from lying on the back to sitting on the side of the bed with no back support.: Minimal Assistance - Patient > 75%     Care Tool Transfers Sit to stand transfer   Sit to stand assist level: Minimal Assistance - Patient > 75%    Chair/bed transfer   Chair/bed transfer assist level: Minimal Assistance - Patient > 75%     Toilet transfer   Assist Level: Minimal Assistance - Patient > 75%     Care Tool Cognition  Expression of Ideas and Wants Expression of Ideas and  Wants: 3. Some difficulty - exhibits some difficulty with expressing needs and ideas (e.g, some words or finishing thoughts) or speech is not clear  Understanding Verbal and Non-Verbal Content Understanding Verbal and Non-Verbal Content: 3. Usually understands -  understands most conversations, but misses some part/intent of message. Requires cues at times to understand   Memory/Recall Ability Memory/Recall Ability : Current season;That he or she is in a hospital/hospital unit   Refer to Care Plan for Long Term Goals  SHORT TERM GOAL WEEK 1 OT Short Term Goal 1 (Week 1): Patient to perform grooming sitting at the sink with Set up OT Short Term Goal 2 (Week 1): Patient to perform LE dressing with AE and Min assist OT Short Term Goal 3 (Week 1): Patient to perform toileting with CGA OT Short Term Goal 4 (Week 1): Patient to perform shower transfer with SBA.  Recommendations for other services: None    Skilled Therapeutic Intervention ADL ADL Equipment Provided: Reacher Eating: Set up Where Assessed-Eating: Bed level Grooming: Setup Where Assessed-Grooming: Wheelchair Upper Body Bathing: Setup Where Assessed-Upper Body Bathing: Edge of bed Lower Body Bathing: Maximal assistance Where Assessed-Lower Body Bathing: Edge of bed Upper Body Dressing: Setup Where Assessed-Upper Body Dressing: Edge of bed Lower Body Dressing: Maximal assistance Where Assessed-Lower Body Dressing: Edge of bed Toileting: Moderate assistance Where Assessed-Toileting: Teacher, Adult Education: Curator Method: Proofreader: Engineer, Technical Sales: Unable to assess Film/video Editor: Unable to assess [employer] Method: Unable to assess Mobility  Bed Mobility Bed Mobility: Rolling Right;Supine to Sit;Sit to Supine Rolling Right: Supervision/verbal cueing Supine to Sit: Contact Guard/Touching assist Sit to Supine: Minimal Assistance -  Patient > 75% Transfers Sit to Stand: Minimal Assistance - Patient > 75% Stand to Sit: Minimal Assistance - Patient > 75%   Discharge Criteria: Patient will be discharged from OT if patient refuses treatment 3 consecutive times without medical reason, if treatment goals not met, if there is a change in medical status, if patient makes no progress towards goals or if patient is discharged from hospital.  The above assessment, treatment plan, treatment alternatives and goals were discussed and mutually agreed upon: by patient  Isaiah JONETTA Freund 11/24/2024, 12:40 PM

## 2024-11-24 NOTE — Progress Notes (Signed)
 Met with patient to review current situation, team conference and plan of care. Reviewed medications , wound care, daily weights, HD site care, bowel elimination, dietary modifications, cpap, MD follow  up after discharge. Continue to follow along to provide educational needs to facilitate preparation for discharge.

## 2024-11-24 NOTE — Progress Notes (Signed)
 [facility] NEPHROLOGY PROGRESS NOTE    Subjective:  Seen in room At CIR now, worked out his am w/ ambulating No SOB/ orthopnea   Presentation summary: 69 y.o. year-old w/ PMH as below who presented to ED on 12/16 brought by EMS from home after his wife found him unconscious on the floor with a large hematoma in the back of the head.  Also was weak on his right side.  In the ED he started vomiting.  He did go to dialysis yesterday morning and was sent back home at 2:30 PM.  In ED BP was 150/80, HR 78, RR 21, temp 97, 98% sat on room air.  Labs showed K+ 3.6, BUN 14, creatinine 2.4, WBC 17K.  Head CT was concerning for mild contusions. Also a right frontal convexity most c/w meningioma.  Neurosurgery and neurology were consulted and patient was given IV Keppra  load, Benadryl , Zofran , and Reglan  IV.  Patient was admitted for fall with TBI, questionable CVA.  Patient was admitted.  We are asked to see for renal failure.  Objective Vitals:   11/21/24 0500 11/21/24 0536 11/21/24 0828 11/21/24 1154  BP:  134/64 (!) 140/69 (!) 162/68  Pulse:  66 66 74  Resp:      Temp:  98.6 F (37 C) 98 F (36.7 C)   TempSrc:      SpO2:  93% 95% 97%  Weight: 121.9 kg     Height:        Physical Exam:       General adult male, no distress Neck supple trachea midline Lungs clear to auscultation  Heart S1S2 no rub Abdomen soft nontender obese habitus Ext no pitting edema Neuro alert and oriented x 3, follows commands   L internal jugular TDC intact   Outpatient Dialysis Orders:  [facility] - [facility] 3h  B400  125kg   3K bath  TDC  Hep 5000 Mircera IV q 2 weeks (100mcg given 12/5) Calcitriol  0.5mcg PO q HD   Assessment/ Plan: Pt is a 69 y.o. yo male with AKI on HD, HTN, T2DM, recent admit for head contusion s/p fall who is being admitted with fever, leukocytosis, and weakness due to sepsis.  He has progressed to ESRD   # ESRD [facility] (note that AKI has progressed to ESRD) - he  has been at [facility] - line holiday => tunneled catheter removed 12/26 and replaced 12/30 - HD per TTS schedule here - next HD today  # HTN/volume - 3-5kg under dry wt  - lower dry wt upon dc - continue fluid restriction of 1.5 liters/day   # Anemia of CKD - Continue Aranesp  100 mcg every Sat - follow Hb  # Secondary hyperparathyroidism - phos controlled, not on binders.  on calcitriol .  # Sepsis due to MSSA and Proteus bacteremia - suspected TDC infection - TDC removed by IR 12/26 - s/p TEE (negative) on 12/31 - f/u cx's 12/26 are negative - per ID needs IV cefazolin  w/ HD x 4 weeks thru 1/23  - per ID for proteus, pt gets 1 more week of levofloxacin    Myer Fret  MD  [facility] 11/24/2024, 1:47 PM  Recent Labs  Lab 11/23/24 1549 11/24/24 1156  HGB 8.3* 8.8*  ALBUMIN  2.4* 2.5*  CALCIUM 8.2* 8.3*  PHOS 4.7* 4.9*  CREATININE 5.91* 6.73*  K 3.9 4.3    Inpatient medications:  calcitRIOL   0.25 mcg Oral Daily   carvedilol   6.25 mg Oral BID WC   [  START ON 11/25/2024] Chlorhexidine  Gluconate Cloth  6 each Topical Q0600   darbepoetin (ARANESP ) injection - DIALYSIS  100 mcg Subcutaneous Q Tue-1800   dorzolamide   1 drop Both Eyes Daily   heparin   3,500 Units Dialysis Once in dialysis   heparin   5,000 Units Subcutaneous Q8H   hydrocerin   Topical BID   insulin  aspart  0-5 Units Subcutaneous QHS   insulin  aspart  0-6 Units Subcutaneous TID WC   insulin  aspart  5 Units Subcutaneous TID WC   insulin  glargine-yfgn  25 Units Subcutaneous Daily   levofloxacin   500 mg Oral Q48H   levothyroxine   125 mcg Oral q AM   scopolamine   1 patch Transdermal Q72H   senna-docusate  1 tablet Oral BID   simvastatin   20 mg Oral QPM   timolol   1 drop Both Eyes Daily    anticoagulant sodium citrate       ceFAZolin  (ANCEF ) IV     acetaminophen , albuterol , alteplase , anticoagulant sodium citrate , bisacodyl , dextrose , diphenhydrAMINE , famotidine , feeding supplement (NEPRO CARB STEADY),  guaiFENesin -dextromethorphan, heparin , heparin , lactulose , lidocaine  (PF), lidocaine -prilocaine , meclizine , melatonin, milk and molasses, pentafluoroprop-tetrafluoroeth, prochlorperazine  **OR** prochlorperazine  **OR** prochlorperazine 

## 2024-11-24 NOTE — Progress Notes (Signed)
 Inpatient Rehabilitation  Patient information reviewed and entered into eRehab system by [facility]. Karen Kays., CCC/SLP, PPS Coordinator.  Information including medical coding, functional ability and quality indicators will be reviewed and updated through discharge.

## 2024-11-24 NOTE — Progress Notes (Signed)
 Pt has been tentatively accepted into a MWF 2nd shift (1230) chair time at [facility] 3x week instead of TCU.   This is all clinic had available, and does not meet request of pt wife. Contacted pt wife at this time, informed that this is all they had available at this clinic and if they wanted a TTS 1st shift right now, it would need to be at a different clinic.  They would like to take this spot at [facility]. Have requested it be reserved.  Informed wife that this schedule does not have to be permanent, and navigator has requested pt be put on wait list for the first TTS 1st shift that opens up. Clinic manager agreeable to this.   Will await d/c date from CIR and coordinate start date at clinic once this obtained.   Lavanda Kollins [facility]enter Dialysis Navigator [phone]

## 2024-11-24 NOTE — Progress Notes (Signed)
 Physical Therapy Assessment and Plan  Patient Details  Name: Brendan [name]. MRN: [id number] Date of Birth: [birth year]  PT Diagnosis: Difficulty walking, Dizziness and giddiness, and Muscle weakness Rehab Potential: Good ELOS: 10-12 days   Today's Date: 11/24/2024 PT Individual Time: 0805-0900 PT Individual Time Calculation (min): 55 min    Hospital Problem: Principal Problem:   TBI (traumatic brain injury) (HCC) Active Problems:   Debility   Past Medical History:  Past Medical History:  Diagnosis Date   Acanthosis nigricans    Atopic dermatitis    CHF (congestive heart failure) (HCC)    CKD (chronic kidney disease) stage 4, GFR 15-29 ml/min (HCC) 01/08/2023   Diabetes (HCC) 11/19/2002   Erectile dysfunction    Fatty liver 11/19/2005   Glaucoma 11/19/2006   Gynecomastia 11/20/2007   Hx of adenomatous colonic polyps 08/26/2023   Hyperaldosteronism 11/19/1998   Hypercholesterolemia    Hyperlipidemia 2010   Hypertension 1999   Hypoglycemic reaction    Hypothyroidism 11/19/2002   Incontinence    Intermittent vertigo 11/19/2010   Left cervical radiculopathy 11/19/2010   Lumbar radiculopathy    Obesity    Peripheral neuropathy    Pollen allergies 11/19/2005   perennial   Prostate cancer (HCC) 11/19/2004   Reflux esophagitis 11/19/1993   Sickle cell trait 11/19/2004   Sleep apnea, obstructive 11/19/1998   uses a cpap   Venous insufficiency 11/20/2003   Vitamin D deficiency 11/19/2010   Past Surgical History:  Past Surgical History:  Procedure Laterality Date   COLONOSCOPY  2006   normal   INCISION AND DRAINAGE OF WOUND N/A 08/28/2024   Procedure: IRRIGATION AND EXCISIONAL DEBRIDEMENT WOUND;  Surgeon: Lyndel Deward PARAS, MD;  Location: [facility] OR;  Service: General;  Laterality: N/A;  EXCISIONAL DEBRIDEMENT   INCISION AND DRAINAGE PERIRECTAL ABSCESS N/A 08/30/2024   Procedure: INCISION AND DRAINAGE OF PERINEAL WOUND;  Surgeon: Polly Cordella LABOR, MD;  Location: [facility] OR;   Service: General;  Laterality: N/A;   IR REMOVAL TUN CV CATH W/O FL  11/13/2024   IR TUNNELED CENTRAL VENOUS CATH PLC W IMG  09/18/2024   IR TUNNELED CENTRAL VENOUS CATH PLC W IMG  09/22/2024   IR TUNNELED CENTRAL VENOUS CATH PLC W IMG  11/17/2024   lap band surgery  2009   left inguinal hernia repair  1998   LIPOMA EXCISION Left 04/17/2023   Procedure: MINOR EXCISION LEFT BUTTOCK SEBACEOUS CYST;  Surgeon: Lyndel Deward PARAS, MD;  Location: [facility];  Service: General;  Laterality: Left;   robotic prostatectomy  2008   TRANSESOPHAGEAL ECHOCARDIOGRAM (CATH LAB) N/A 11/18/2024   Procedure: TRANSESOPHAGEAL ECHOCARDIOGRAM;  Surgeon: Santo Stanly LABOR, MD;  Location: [facility];  Service: Cardiovascular;  Laterality: N/A;    Assessment & Plan Clinical Impression: Patient is a 69 year old male with history of T2DM, cervical and lumbar radiculopathy with neuropathy, fatty liver, atopic dermatitis, chronic HF, ESRD with recent transition to HD,  Fournier's gangrene perineum/scrotum s/p I & D 08/28/24, forearm abscess s/p I/D 10/22/24, recent fall 11/03/24 where he struck his head and found to have right frontal meningioma, posterior scalp and prefrontal scalp hematoma and discharged to home on 12/18. He was readmitted on 11/11/24 with DKA, hypotension, malaise and fever 101.1. He was started on fluid and broad spectrum antibiotics for sepsis. He was found to have MSSA and Proteus bacteremia felt to be due to line infection. He was noted to be lethargic and CT head done revaling regressed frontal  gyrus hematoms and new small bilateral SDH question post traumatic CSF hygromas or low density SDH.  TEE negative for vegetation, showed moderate left lateral pleural effusion and  EF 55-60%. TDC was removed on 12/26 for line holiday.   Repeat [facility] 12/26 without growth and tunneled catheter placed on 12/30 by Dr. Philip.   On 1500 cc FR with resumption of HD TTS and Cefepime  changed to  cefazolin  (to start 01/06 with HD) to complete 4 total weeks of antibiotics from line removal. Levaquin  to continue for additional week at renal dose. Blood sugars have been variable and lantus  being adjusted.   Patient currently requires min with mobility secondary to muscle weakness, decreased cardiorespiratoy endurance, central origin, and decreased standing balance, decreased postural control, and decreased balance strategies.  Prior to hospitalization, patient was modified independent  with mobility and lived with Spouse in a House home.  Home access is 4Stairs to enter.  Patient will benefit from skilled PT intervention to maximize safe functional mobility, minimize fall risk, and decrease caregiver burden for planned discharge home with 24 hour supervision.  Anticipate patient will benefit from follow up HH at discharge.  PT - End of Session Activity Tolerance: Tolerates 30+ min activity with multiple rests Endurance Deficit: Yes Endurance Deficit Description: requires rest breaks with all mobility tasks, reports due to dizziness as well as BLE weakness PT Assessment Rehab Potential (ACUTE/IP ONLY): Good PT Barriers to Discharge: Decreased caregiver support;Inaccessible home environment PT Barriers to Discharge Comments: 4 STE, unsure level of support at home PT Patient demonstrates impairments in the following area(s): Balance;Endurance;Behavior;Safety;Sensory;Perception;Motor;Pain PT Transfers Functional Problem(s): Bed Mobility;Bed to Chair;Car PT Locomotion Functional Problem(s): Ambulation;Stairs PT Plan PT Intensity: Minimum of 1-2 x/day ,45 to 90 minutes PT Frequency: 5 out of 7 days PT Duration Estimated Length of Stay: 10-12 days PT Treatment/Interventions: Discharge planning;Ambulation/gait training;DME/adaptive equipment instruction;Community reintegration;Neuromuscular re-education;Stair training;Psychosocial support;UE/LE Strength taining/ROM;Wheelchair  propulsion/positioning;Balance/vestibular training;Pain management;Therapeutic Activities;UE/LE Coordination activities;Functional mobility training;Disease management/prevention;Patient/family education;Splinting/orthotics;Therapeutic Exercise PT Transfers Anticipated Outcome(s): modI PT Locomotion Anticipated Outcome(s): supervision ambulatory PT Recommendation Recommendations for Other Services: Neuropsych consult Follow Up Recommendations: Home health PT Patient destination: Home Equipment Recommended: To be determined   PT Evaluation Precautions/Restrictions Precautions Precautions: Fall Recall of Precautions/Restrictions: Intact Precaution/Restrictions Comments: watch BP, HD fluid restriction Restrictions Weight Bearing Restrictions Per Provider Order: No Other Position/Activity Restrictions: increased dizziness with transitions supine-sit-stand; not orthostatic on eval. Pain Interference Pain Interference Pain Effect on Sleep: 1. Rarely or not at all Pain Interference with Therapy Activities: 1. Rarely or not at all Pain Interference with Day-to-Day Activities: 1. Rarely or not at all Home Living/Prior Functioning Home Living Available Help at Discharge: Family;Available PRN/intermittently Type of Home: House Home Access: Stairs to enter [employer] of Steps: 4 Entrance Stairs-Rails: Right;Left;Can reach both Home Layout: One level Bathroom Shower/Tub: Health Visitor: Handicapped height Additional Comments: Pt lives with wife who works during the day. Wife takes him to HD before going to work and then picks him up during her lunch hour and takes him home.  Lives With: Spouse Prior Function Level of Independence: Independent with basic ADLs;Independent with transfers;Requires assistive device for independence  Able to Take Stairs?: Yes Driving: No Vocation: Unemployed Cognition Overall Cognitive Status: Within Functional Limits for tasks  assessed Arousal/Alertness: Awake/alert Orientation Level: Oriented X4 Year: 2026 Month: January Day of Week: Correct Attention: Sustained Sustained Attention: Appears intact Memory: Appears intact Awareness: Appears intact Problem Solving: Appears intact Safety/Judgment: Appears intact [employer] of Cognitive  Functioning: Purposeful, Appropriate: Stand-by Assistance on Request Sensation Sensation Light Touch: Impaired by gross assessment Hot/Cold: Appears Intact Proprioception: Appears Intact Stereognosis: Appears Intact Additional Comments: Fine touch mildly impaired; tingling but does not report prior dx of neuropathy at hands. reported neuropathy at feet Coordination Gross Motor Movements are Fluid and Coordinated: No Fine Motor Movements are Fluid and Coordinated: No Coordination and Movement Description: limited by general debility and dizziness Motor  Motor Motor: Other (comment) Motor - Skilled Clinical Observations: hesitant movements, but unable to assess if due to pain, dizziness or weakness.  Trunk/Postural Assessment  Cervical Assessment Cervical Assessment: Within Functional Limits Thoracic Assessment Thoracic Assessment: Within Functional Limits Lumbar Assessment Lumbar Assessment: Within Functional Limits Postural Control Postural Control: Deficits on evaluation Righting Reactions: delayed and inadequate Protective Responses: decreased Postural Limitations: decreased  Balance Balance Balance Assessed: Yes Dynamic Sitting Balance Dynamic Sitting - Balance Support: Feet supported Dynamic Sitting - Level of Assistance: 5: Stand by assistance Static Standing Balance Static Standing - Balance Support: Bilateral upper extremity supported Static Standing - Level of Assistance: Other (comment) (CGA) Dynamic Standing Balance Dynamic Standing - Balance Support: Bilateral upper extremity supported;During functional activity Dynamic Standing -  Level of Assistance: 4: Min assist Extremity Assessment  RLE Assessment RLE Assessment: Exceptions to [facility] General Strength Comments: grossly 3+/5, endurance limited LLE Assessment LLE Assessment: Exceptions to [facility] General Strength Comments: grossly 3+/5, endurance limited  Care Tool Care Tool Bed Mobility Roll left and right activity   Roll left and right assist level: Contact Guard/Touching assist    Sit to lying activity   Sit to lying assist level: Moderate Assistance - Patient 50 - 74%    Lying to sitting on side of bed activity   Lying to sitting on side of bed assist level: the ability to move from lying on the back to sitting on the side of the bed with no back support.: Minimal Assistance - Patient > 75%     Care Tool Transfers Sit to stand transfer   Sit to stand assist level: Minimal Assistance - Patient > 75%    Chair/bed transfer   Chair/bed transfer assist level: Minimal Assistance - Patient > 75%    Car transfer   Car transfer assist level: Minimal Assistance - Patient > 75% (simulated with bed/chair transfer)      Care Tool Locomotion Ambulation   Assist level: Minimal Assistance - Patient > 75% Assistive device: Walker-rolling Max distance: 24'  Walk 10 feet activity   Assist level: Minimal Assistance - Patient > 75% Assistive device: Walker-rolling   Walk 50 feet with 2 turns activity Walk 50 feet with 2 turns activity did not occur: Safety/medical concerns      Walk 150 feet activity Walk 150 feet activity did not occur: Safety/medical concerns      Walk 10 feet on uneven surfaces activity Walk 10 feet on uneven surfaces activity did not occur: Safety/medical concerns      Stairs Stair activity did not occur: Safety/medical concerns        Walk up/down 1 step activity Walk up/down 1 step or curb (drop down) activity did not occur: Safety/medical concerns      Walk up/down 4 steps activity Walk up/down 4 steps activity did not occur:  Safety/medical concerns      Walk up/down 12 steps activity Walk up/down 12 steps activity did not occur: Safety/medical concerns      Pick up small objects from floor Pick up small object from the floor (  from standing position) activity did not occur: Safety/medical concerns      Wheelchair Is the patient using a wheelchair?: Yes Type of Wheelchair: Manual   Wheelchair assist level: Dependent - Patient 0%    Wheel 50 feet with 2 turns activity   Assist Level: Dependent - Patient 0%  Wheel 150 feet activity   Assist Level: Dependent - Patient 0%    Refer to Care Plan for Long Term Goals  SHORT TERM GOAL WEEK 1 PT Short Term Goal 1 (Week 1): Pt will complete transfer with supervision with LRAD PT Short Term Goal 2 (Week 1): Pt will ambulate 100' with LRAD and CGA PT Short Term Goal 3 (Week 1): Pt will complete up/down 4 steps with min assist and BHRs  Recommendations for other services: Neuropsych  Skilled Therapeutic Intervention Evaluation completed (see details above and below) with education on PT POC and goals and individual treatment initiated with focus on therapeutic activities to facilitate improved body mechanics for transfer training and participation with self care tasks as well as gait training for upright tolerance and gait mechanics. Pt provided with WC during session to allow appropriate seating and transfers as well as RW to simulate transfers at baseline. Pt does not report pain during session. Pt completes bed mobility with min assist, reaching for therapist hand to come to sitting on EOB. Pt completes upper and lower body dressing with max assist with pt demonstrating difficulty secondary to dizziness and poor tolerance to upright, cues provided for assist. Pt compeltes sit to stands throughout session with RW with min assist. Pt completes stand step transfer with RW with min assist. Pt completes gait with RW with min assist and same person WC follow for fatigue,  ambulating up to 24'. Pt returns to room and remains seated in [facility] with all needs within reach, cal light in place and chair alarm donned and activated at end of session. NT in room at end of session.    Mobility Bed Mobility Bed Mobility: Rolling Right;Supine to Sit;Sit to Supine Rolling Right: Supervision/verbal cueing Supine to Sit: Minimal Assistance - Patient > 75% Sit to Supine: Minimal Assistance - Patient > 75% Transfers Transfers: Sit to Stand;Stand to Sit;Transfer Sit to Stand: Minimal Assistance - Patient > 75% Stand to Sit: Minimal Assistance - Patient > 75% Transfer (Assistive device): Rolling walker Locomotion  Gait Ambulation: Yes Gait Assistance: Minimal Assistance - Patient > 75% Gait Distance (Feet): 24 Feet Assistive device: Rolling walker Gait Assistance Details: Verbal cues for safe use of DME/AE;Verbal cues for precautions/safety;Verbal cues for gait pattern Gait Gait: Yes Gait Pattern: Impaired Gait Pattern: Step-through pattern;Decreased stride length;Trunk flexed Gait velocity: reduced Stairs / Additional Locomotion Stairs: No Wheelchair Mobility Wheelchair Mobility: No   Discharge Criteria: Patient will be discharged from PT if patient refuses treatment 3 consecutive times without medical reason, if treatment goals not met, if there is a change in medical status, if patient makes no progress towards goals or if patient is discharged from hospital.  The above assessment, treatment plan, treatment alternatives and goals were discussed and mutually agreed upon: by patient  Reche Ohara 11/24/2024, 5:24 PM

## 2024-11-24 NOTE — Plan of Care (Signed)
" °  Problem: RH Balance Goal: LTG Patient will maintain dynamic standing balance (PT) Description: LTG:  Patient will maintain dynamic standing balance with assistance during mobility activities (PT) Flowsheets (Taken 11/24/2024 1729) LTG: Pt will maintain dynamic standing balance during mobility activities with:: Independent with assistive device    Problem: Sit to Stand Goal: LTG:  Patient will perform sit to stand with assistance level (PT) Description: LTG:  Patient will perform sit to stand with assistance level (PT) Flowsheets (Taken 11/24/2024 1729) LTG: PT will perform sit to stand in preparation for functional mobility with assistance level: Independent with assistive device   Problem: RH Bed Mobility Goal: LTG Patient will perform bed mobility with assist (PT) Description: LTG: Patient will perform bed mobility with assistance, with/without cues (PT). Flowsheets (Taken 11/24/2024 1729) LTG: Pt will perform bed mobility with assistance level of: Independent with assistive device    Problem: RH Bed to Chair Transfers Goal: LTG Patient will perform bed/chair transfers w/assist (PT) Description: LTG: Patient will perform bed to chair transfers with assistance (PT). Flowsheets (Taken 11/24/2024 1729) LTG: Pt will perform Bed to Chair Transfers with assistance level: Independent with assistive device    Problem: RH Car Transfers Goal: LTG Patient will perform car transfers with assist (PT) Description: LTG: Patient will perform car transfers with assistance (PT). Flowsheets (Taken 11/24/2024 1729) LTG: Pt will perform car transfers with assist:: Supervision/Verbal cueing   Problem: RH Ambulation Goal: LTG Patient will ambulate in controlled environment (PT) Description: LTG: Patient will ambulate in a controlled environment, # of feet with assistance (PT). Flowsheets (Taken 11/24/2024 1729) LTG: Pt will ambulate in controlled environ  assist needed:: Supervision/Verbal cueing LTG: Ambulation  distance in controlled environment: 150' Goal: LTG Patient will ambulate in home environment (PT) Description: LTG: Patient will ambulate in home environment, # of feet with assistance (PT). Flowsheets (Taken 11/24/2024 1729) LTG: Pt will ambulate in home environ  assist needed:: Supervision/Verbal cueing LTG: Ambulation distance in home environment: 50'   Problem: RH Stairs Goal: LTG Patient will ambulate up and down stairs w/assist (PT) Description: LTG: Patient will ambulate up and down # of stairs with assistance (PT) Flowsheets (Taken 11/24/2024 1729) LTG: Pt will ambulate up/down stairs assist needed:: Contact Guard/Touching assist LTG: Pt will  ambulate up and down number of stairs: 4 with BHRs per home set up   "

## 2024-11-24 NOTE — Progress Notes (Signed)
 Per therapy eval patient does not need behavior plan.

## 2024-11-24 NOTE — Progress Notes (Signed)
 Patient ID: Brendan [name]., male   DOB: [birth year], 69 y.o.   MRN: [id number]   SW went by to meet with pt to complete assessment but pt care in process. SW will follow-up to complete.  Graeme Jude, MSW, LCSW Office: [phone] Cell: [phone] Fax: [phone]

## 2024-11-24 NOTE — Evaluation (Signed)
 Speech Language Pathology Assessment and Plan  Patient Details  Name: Brendan [name]. MRN: [id number] Date of Birth: 09-21-1956  SLP Diagnosis:  (n/a skilled ST not warranted)  Rehab Potential:  n/a skilled ST not warranted  ELOS: n/a skilled ST not warranted    Today's Date: 11/24/2024 SLP Individual Time: 0900-1000 SLP Individual Time Calculation (min): 60 min   Hospital Problem: Principal Problem:   TBI (traumatic brain injury) (HCC) Active Problems:   Debility  Past Medical History:  Past Medical History:  Diagnosis Date   Acanthosis nigricans    Atopic dermatitis    CHF (congestive heart failure) (HCC)    CKD (chronic kidney disease) stage 4, GFR 15-29 ml/min (HCC) 01/08/2023   Diabetes (HCC) 11/19/2002   Erectile dysfunction    Fatty liver 11/19/2005   Glaucoma 11/19/2006   Gynecomastia 11/20/2007   Hx of adenomatous colonic polyps 08/26/2023   Hyperaldosteronism 11/19/1998   Hypercholesterolemia    Hyperlipidemia 2010   Hypertension 1999   Hypoglycemic reaction    Hypothyroidism 11/19/2002   Incontinence    Intermittent vertigo 11/19/2010   Left cervical radiculopathy 11/19/2010   Lumbar radiculopathy    Obesity    Peripheral neuropathy    Pollen allergies 11/19/2005   perennial   Prostate cancer (HCC) 11/19/2004   Reflux esophagitis 11/19/1993   Sickle cell trait 11/19/2004   Sleep apnea, obstructive 11/19/1998   uses a cpap   Venous insufficiency 11/20/2003   Vitamin D deficiency 11/19/2010   Past Surgical History:  Past Surgical History:  Procedure Laterality Date   COLONOSCOPY  2006   normal   INCISION AND DRAINAGE OF WOUND N/A 08/28/2024   Procedure: IRRIGATION AND EXCISIONAL DEBRIDEMENT WOUND;  Surgeon: Lyndel Deward PARAS, MD;  Location: [facility] OR;  Service: General;  Laterality: N/A;  EXCISIONAL DEBRIDEMENT   INCISION AND DRAINAGE PERIRECTAL ABSCESS N/A 08/30/2024   Procedure: INCISION AND DRAINAGE OF PERINEAL WOUND;  Surgeon: Polly Cordella LABOR, MD;  Location: [facility] OR;  Service: General;  Laterality: N/A;   IR REMOVAL TUN CV CATH W/O FL  11/13/2024   IR TUNNELED CENTRAL VENOUS CATH PLC W IMG  09/18/2024   IR TUNNELED CENTRAL VENOUS CATH PLC W IMG  09/22/2024   IR TUNNELED CENTRAL VENOUS CATH PLC W IMG  11/17/2024   lap band surgery  2009   left inguinal hernia repair  1998   LIPOMA EXCISION Left 04/17/2023   Procedure: MINOR EXCISION LEFT BUTTOCK SEBACEOUS CYST;  Surgeon: Lyndel Deward PARAS, MD;  Location: [facility];  Service: General;  Laterality: Left;   robotic prostatectomy  2008   TRANSESOPHAGEAL ECHOCARDIOGRAM (CATH LAB) N/A 11/18/2024   Procedure: TRANSESOPHAGEAL ECHOCARDIOGRAM;  Surgeon: Santo Stanly LABOR, MD;  Location: [facility];  Service: Cardiovascular;  Laterality: N/A;    Assessment / Plan / Recommendation Clinical Impression Pt is a 69 year old male with history of T2DM, cervical and lumbar radiculopathy with neuropathy, fatty liver, atopic dermatitis, chronic HF, ESRD with recent transition to HD,  Fournier's gangrene perineum/scrotum s/p I & D 08/28/24, forearm abscess s/p I/D 10/22/24, recent fall 11/03/24 where he struck his head and found to have right frontal meningioma, posterior scalp and prefrontal scalp hematoma and discharged to home on 12/18. He was readmitted on 11/11/24 with DKA, hypotension, malaise and fever 101.1. He was started on fluid and broad spectrum antibiotics for sepsis. He was found to have MSSA and Proteus bacteremia felt to be due to line infection. He was noted  to be lethargic and CT head done revaling regressed frontal gyrus hematoms and new small bilateral SDH question post traumatic CSF hygromas or low density SDH.  TEE negative for vegetation, showed moderate left lateral pleural effusion and  EF 55-60%. Presents to CIR for comprehensive rehab.   Pt presented w/ cognition WFL across all domains assessed. Of note, pt presented w/ flat affect. Reduced initiation  (and general participation/motivation) also noted, however, persistent dizziness negatively impacted this as well as his ability to complete written tasks. He completed the [facility] Mental Status (SLUMS) Exam and scored a 26/30, which is right below normal limits for his education level. He was able to recall 4/5 words and remaining points were missed during generative naming task. Of note, reduced effort/participation noted during that subtest as he stopped attempting responses after ~25 seconds and just reported I don't know despite encouragement to continue attempts. He required additional time to complete functional money management eval task d/t dizziness. However, with additional time, he completed at modI. During transfer back to bed, he demonstrated adequate safety awareness and problem solving. He again presented w/ reduced initiation, but reported that this was d/t dizziness when questioned. No concern re speech production or expressive/receptive language.   Difficulty to truly assess very complex domains of cognition given dizziness and reduced motivation/initiation. However, cognition appears WFL at this time and skilled ST services are not warranted. If dizziness begins to improve and cognitive deficits become apparent during more complex tasks, please feel free to re consult ST.     Skilled Therapeutic Interventions          SLP facilitated a cognitive-linguistic evaluation and brief bedside swallow screen to assess pt's cognitive-communication skills and determine need for additional skilled ST services. See above for more information.    SLP Assessment  Patient does not need any further Speech Lanaguage Pathology Services    Recommendations  SLP Diet Recommendations: Age appropriate regular solids;Thin Patient destination: Home Follow up Recommendations: None Equipment Recommended: None recommended by SLP    SLP Frequency  (n/a skilled ST not warranted)   SLP  Duration  SLP Intensity  SLP Treatment/Interventions n/a skilled ST not warranted   (n/a skilled ST not warranted)   (n/a skilled ST not warranted)    Pain Pain Assessment Pain Scale: 0-10 Pain Score: 9  Pain Location: Head Pain Descriptors / Indicators: Aching Pain Onset: On-going Patients Stated Pain Goal: 0 Pain Intervention(s): Medication (See eMAR) PAINAD (Pain Assessment in Advanced Dementia) Breathing: normal  Prior Functioning Type of Home: House  Lives With: Spouse Available Help at Discharge: Family;Available PRN/intermittently Vocation: Unemployed  SLP Evaluation Cognition Overall Cognitive Status: Within Functional Limits for tasks assessed Arousal/Alertness: Awake/alert Orientation Level: Oriented X4 Year: 2026 Month: January Day of Week: Correct Attention: Sustained Sustained Attention: Appears intact Memory: Appears intact Memory Impairment: Retrieval deficit Awareness: Appears intact Problem Solving: Appears intact Safety/Judgment: Appears intact [place] [employer] Scales of Cognitive Functioning: Purposeful, Appropriate: Stand-by Assistance on Request  Comprehension Auditory Comprehension Overall Auditory Comprehension: Appears within functional limits for tasks assessed Visual Recognition/Discrimination Discrimination: Not tested Reading Comprehension Reading Status: Not tested Expression Expression Primary Mode of Expression: Verbal Verbal Expression Overall Verbal Expression: Appears within functional limits for tasks assessed Oral Motor Oral Motor/Sensory Function Overall Oral Motor/Sensory Function: Within functional limits Motor Speech Overall Motor Speech: Appears within functional limits for tasks assessed  Care Tool Care Tool Cognition Ability to hear (with hearing aid or hearing appliances if normally used Ability  to hear (with hearing aid or hearing appliances if normally used): 0. Adequate - no difficulty in normal  conservation, social interaction, listening to TV   Expression of Ideas and Wants Expression of Ideas and Wants: 3. Some difficulty - exhibits some difficulty with expressing needs and ideas (e.g, some words or finishing thoughts) or speech is not clear   Understanding Verbal and Non-Verbal Content Understanding Verbal and Non-Verbal Content: 4. Understands (complex and basic) - clear comprehension without cues or repetitions  Memory/Recall Ability Memory/Recall Ability : Current season;That he or she is in a hospital/hospital unit   Motor Speech Assessment  WFL  Bedside Swallowing Assessment See clinical impressions  Short Term Goals: No short term goals set  Refer to Care Plan for Long Term Goals  Recommendations for other services: None   Discharge Criteria: Patient will be discharged from SLP if patient refuses treatment 3 consecutive times without medical reason, if treatment goals not met, if there is a change in medical status, if patient makes no progress towards goals or if patient is discharged from hospital.  The above assessment, treatment plan, treatment alternatives and goals were discussed and mutually agreed upon: by patient  Recardo DELENA Mole 11/24/2024, 12:50 PM

## 2024-11-25 DIAGNOSIS — S069XAS Unspecified intracranial injury with loss of consciousness status unknown, sequela: Secondary | ICD-10-CM | POA: Diagnosis not present

## 2024-11-25 LAB — GLUCOSE, CAPILLARY
Glucose-Capillary: 261 mg/dL — ABNORMAL HIGH (ref 70–99)
Glucose-Capillary: 187 mg/dL — ABNORMAL HIGH (ref 70–99)
Glucose-Capillary: 199 mg/dL — ABNORMAL HIGH (ref 70–99)
Glucose-Capillary: 165 mg/dL — ABNORMAL HIGH (ref 70–99)

## 2024-11-25 MED ORDER — INSULIN GLARGINE-YFGN 100 UNIT/ML ~~LOC~~ SOLN
30.0000 [IU] | Freq: Every day | SUBCUTANEOUS | Status: DC
  Administered 2024-11-25 – 2024-11-27 (×3): 30 [IU] via SUBCUTANEOUS
  Filled 2024-11-25 (×3): qty 0.3

## 2024-11-25 MED ORDER — CHLORHEXIDINE GLUCONATE CLOTH 2 % EX PADS
6.0000 | MEDICATED_PAD | Freq: Every day | CUTANEOUS | Status: DC
  Administered 2024-11-26 – 2024-12-05 (×10): 6 via TOPICAL

## 2024-11-25 MED ORDER — CHLORHEXIDINE GLUCONATE CLOTH 2 % EX PADS
6.0000 | MEDICATED_PAD | Freq: Two times a day (BID) | CUTANEOUS | Status: DC
  Administered 2024-11-25 – 2024-12-04 (×15): 6 via TOPICAL

## 2024-11-25 MED ORDER — HYDRALAZINE HCL 25 MG PO TABS: 25.0000 mg | ORAL_TABLET | Freq: Four times a day (QID) | ORAL | Status: DC | PRN

## 2024-11-25 NOTE — Progress Notes (Addendum)
 Patient ID: Brendan [name]., male   DOB: 11-04-1956, 69 y.o.   MRN: [id number]  [id number]- SW left message for pt wife to introduce self, explain role, discuss discharge process, and inform on ELOS. SW informed will follow-up with updates after team conference.  *SW received return phone call from pt wife discussing above. Discussed fam edu. She will be here on Tuesday 2pm-4pm with likely one of their sons.   Graeme Jude, MSW, LCSW Office: [phone] Cell: [phone] Fax: [phone]

## 2024-11-25 NOTE — Patient Care Conference (Signed)
 Inpatient RehabilitationTeam Conference and Plan of Care Update Date: 11/25/2024   Time: 1015 am    Patient Name: Brendan [name].      Medical Record Number: [id number]  Date of Birth: 06/10/1956 Sex: Male         Room/Bed: 4M05C/4M05C-01 Payor Info: Payor: [employer]UE [employer] MEDICARE / Plan: BCBS MEDICARE / Product Type: *No Product type* /    Admit Date/Time:  11/23/2024  2:29 PM  Primary Diagnosis:  TBI (traumatic brain injury) [facility])  Hospital Problems: Principal Problem:   TBI (traumatic brain injury) (HCC) Active Problems:   Debility    Expected Discharge Date: Expected Discharge Date:  (TBD)  Team Members Present: Physician leading conference: Dr. Joesph Likes Social Worker Present: Graeme Jude, LCSW Nurse Present: Eulalio Falls, RN PT Present: Kirt Dawn, PT OT Present: Nereida Habermann, OT SLP Present: Recardo Mole, SLP     Current Status/Progress Goal Weekly Team Focus  Bowel/Bladder      Patient oliguric and continent of bowels    Remain continent of bowels   Assess bowel and bladder q shift   Swallow/Nutrition/ Hydration   regular/thin - eval pending           ADL's   Eval pending   Eval pending   Eval pending    Mobility   eval pending           Communication   eval pending            Safety/Cognition/ Behavioral Observations  eval pending            Pain      No complaints of pain    <4 w/ prns    <4 w/ prns  Skin    Bilateral rash on abdomen  Surgical Open Surgical Incision Perineum Left  Fournier's gangrene Toe right abrasion   Incision will heal during stay on rehab; patient will learn how do own incision care     Cleanse scrotal/perineal wound with Vashe wound cleanser, do not rinse. Using a Q tip applicator apply Vashe moistened Kerlix into wound bed 2 times daily. Cover with ABD pad and attempt to secure with Mesh underwear daily    Discharge Planning:  TBA    Team Discussion: Patient was admitted post  debility due to subacute TBI with SDH s/p fall. Patient struggling for sleep, elevated blood sugars, dizziness , occipital headache:  medication and treatments adjusted by MD. Patient on dialysis.   Patient on target to meet rehab goals: Evals pending  *See Care Plan and progress notes for long and short-term goals.   Revisions to Treatment Plan:  Sleep chart Vestibular Evaluation Daily weights  Teaching Needs:  Safety, medications, transfers, toileting , wound care, etc   Current Barriers to Discharge: Decreased caregiver support, Home enviroment access/layout, IV antibiotics, and Hemodialysis  Possible Resolutions to Barriers: Family Education     Medical Summary Current Status: Medically complicated by complex wounds with recent Fournier's gangrene, ESRD on HD, chronic hyponatremia, anemia, TBI with SDH and cognitive deficits, hypertension, type 2 diabetes, and MSSA bacteremia on 2 antibiotics  Barriers to Discharge: Behavior/Mood;Electrolyte abnormality;Medical stability;Infection/IV Antibiotics;Uncontrolled Pain;Uncontrolled Hypertension;Uncontrolled Diabetes;Symptomatic Anemia;Self-care education;Renal Insufficiency/Failure   Possible Resolutions to [employer]: Titrate diabetes regimen to control blood sugars, monitor vitals and volume status with dialysis, encourage sleep/wake cycle for cognitive recovery, complex wound management   Continued Need for Acute Rehabilitation Level of Care: The patient requires daily medical management by a physician with specialized training in physical  medicine and rehabilitation for the following reasons: Direction of a multidisciplinary physical rehabilitation program to maximize functional independence : Yes Medical management of patient stability for increased activity during participation in an intensive rehabilitation regime.: Yes Analysis of laboratory values and/or radiology reports with any subsequent need for medication  adjustment and/or medical intervention. : Yes   I attest that I was present, lead the team conference, and concur with the assessment and plan of the team.   Brendan [name] 11/25/2024, 1015 am

## 2024-11-25 NOTE — Plan of Care (Signed)
" °  Problem: Consults Goal: RH GENERAL PATIENT EDUCATION Description: See Patient Education module for education specifics. Outcome: Progressing   Problem: RH BOWEL ELIMINATION Goal: RH STG MANAGE BOWEL WITH ASSISTANCE Description: STG Manage Bowel with mod I aupervision Assistance. Outcome: Progressing   Problem: RH SKIN INTEGRITY Goal: RH STG SKIN FREE OF INFECTION/BREAKDOWN Description: Manage skin free of infections with mod I - supervision assistance  Outcome: Progressing   Problem: RH SAFETY Goal: RH STG ADHERE TO SAFETY PRECAUTIONS W/ASSISTANCE/DEVICE Description: STG Adhere to Safety Precautions With mod I supervision Assistance/Device. Outcome: Progressing   Problem: RH PAIN MANAGEMENT Goal: RH STG PAIN MANAGED AT OR BELOW PT'S PAIN GOAL Description: <4 w/ prns Outcome: Progressing   Problem: RH KNOWLEDGE DEFICIT GENERAL Goal: RH STG INCREASE KNOWLEDGE OF SELF CARE AFTER HOSPITALIZATION Description: Manage increase knowledge of self care after hospitalization with mod I- supervision assistance from wife using educational materials provided Outcome: Progressing   "

## 2024-11-25 NOTE — Progress Notes (Signed)
 Occupational Therapy Session Note  Patient Details  Name: Brendan [name]. MRN: [id number] Date of Birth: 1956/08/31  Today's Date: 11/25/2024 OT Individual Time: 1119-1203+1304-1342 OT Individual Time Calculation (min): 44 min    Short Term Goals: Week 1:  OT Short Term Goal 1 (Week 1): Patient to perform grooming sitting at the sink with Set up OT Short Term Goal 2 (Week 1): Patient to perform LE dressing with AE and Min assist OT Short Term Goal 3 (Week 1): Patient to perform toileting with CGA OT Short Term Goal 4 (Week 1): Patient to perform shower transfer with SBA.  Skilled Therapeutic Interventions/Progress Updates:  Session 1:  Pt greeted supine in bed, pt agreeable to OT intervention.   Pt reported unrated pain in  low back, provided rest breaks as needed.    Transfers/bed mobility/functional mobility:  Pt completed supine>sit with CGA. Pt completed sit>stand from EOB with RW and CGA, stand pivot with RW with CGA.   Pt reports dizziness that is worst than this AM. Alerted nurse who provided meds as needed. BP 145/70( 93) HR 65 bpm   Therapeutic activity:  Pt completed standing endurance task with pt instructed to stand during each turn of connect 4, pt able to stand x8 with CGA, emphasis on gaze stabilization during sit>stands to decrease dizziness with mobility. Pt needed min cues for set- up and technique for hand placement.   Pt able to stand for one full minute to improve standing tolerance for ADLs with unilateral support with CGA.    Exercises: pt completed below therex to improve LB strength and endurance for ADL participation: X20 LAQ with level 3 resistance from sitting in w/c  Ended session with pt seated in w/c with all needs within reach.                        Session 2: Pt greeted seated in w/c, pt agreeable to OT intervention.       Exercises:  Pt completed 5 mins on kinetron with a workload of 50 lbs to facilitate improved LB strength and endurance  for higher level functional mobility tasks. Pt needed 4 rest breaks d/t pain in knees and fatigue. Provided rest breaks as needed. At end of task pt reports, thats the Only gas I got  Pt able to complete alternating step ups to 2 inch steps with BUE support 2x20 reps. Pt reports being fearful of completing therex on steps as this is where he fell, however pt seemed to encouraged by completing task.                  Ended session with pt seated in w/c with all needs within reach.                 Therapy Documentation Precautions:  Precautions Precautions: Fall Recall of Precautions/Restrictions: Intact Precaution/Restrictions Comments: watch BP, HD fluid restriction Restrictions Weight Bearing Restrictions Per Provider Order: No Other Position/Activity Restrictions: increased dizziness with transitions supine-sit-stand; not orthostatic on eval.    Therapy/Group: Individual Therapy  Ronal [facility] 11/25/2024, 2:01 PM

## 2024-11-25 NOTE — Progress Notes (Signed)
 Physical Therapy Session Note  Patient Details  Name: Brendan [name]. MRN: [id number] Date of Birth: [birth year]  Today's Date: 11/25/2024 PT Individual Time: 9082-8985 PT Individual Time Calculation (min): 57 min   Today's Date: 11/25/2024 PT Individual Time: 8552-8470 PT Individual Time Calculation (min): 42 min   Short Term Goals: Week 1:  PT Short Term Goal 1 (Week 1): Pt will complete transfer with supervision with LRAD PT Short Term Goal 2 (Week 1): Pt will ambulate 100' with LRAD and CGA PT Short Term Goal 3 (Week 1): Pt will complete up/down 4 steps with min assist and BHRs  Skilled Therapeutic Interventions/Progress Updates:     1st Session: Pt received semi reclined in bed and agrees to therapy. Pt does not report pain but does report fatigue and slight dizziness, but says it is improved somewhat from yesterday. Pt performs supine to sit with CGA and cues for positioning. PT assists to don depends, pants, and shirts. Pt performs sit to stand with CGA and cues for sequencing. Pt stands with RW and ambulates to [facility], x15' with RW and cues for safe AD management and positioning. Pt performs face washing at wink to work on coordination and therapeutic activities. WC transport to gym. Pt performs sit to stand with RW and cues for hand placement. Pt ambulates x60' with RW and CGA, with cues for upright gaze to improve posture and balance, and decreasing WB through RW for energy conservation. When asked about fatigue, pt reports that he is about at his limit for walking for the day. PT provides education on importance of continued ambulation and activity to build up endurance. Following extended seated rest break, pt stands with CGA and completes x20 alternating foot taps on 3 step with RW and cues for step sequencing. Pt performs additional bout of x20 foot taps. WC transport back to room. Left seated with all needs within reach.  2nd Session: Pt received seated in [facility] and agrees to therapy. No  complaint of pain. WC transport to gym. Pt performs alternating LAQs to warm up knee extensors. Pt then performs sit to stand with minA and cues for hand placement and sequencing. Pt ambulates x90' with RW and cues for upright gaze and decreasing WB through RW for energy conservation and improved body mechanics   Pt ambulates x10' to mat table with RW. PT raises mat table to allow pt to perform sit to stand repeatedly for strength and endurance training. Pt completes x5 with CGA and cues for hand placement and sequencing. Pt reports that is wears [him] out. Pt completes additional x5 reps following rest break. When asked if he could complete a 3rd set, pt reports that he feels like he needs to stop due to pain in Rt side. PT provides rest break to manage pain. Stand step from mat>WC>Bed with CGA and cues for positioning. Left supine with all needs within reach.   Therapy Documentation Precautions:  Precautions Precautions: Fall Recall of Precautions/Restrictions: Intact Precaution/Restrictions Comments: watch BP, HD fluid restriction Restrictions Weight Bearing Restrictions Per Provider Order: No Other Position/Activity Restrictions: increased dizziness with transitions supine-sit-stand; not orthostatic on eval.    Therapy/Group: Individual Therapy  Elsie JAYSON Dawn, PT, DPT 11/25/2024, 4:16 PM

## 2024-11-25 NOTE — Progress Notes (Signed)
 "                                                        PROGRESS NOTE   Subjective/Complaints:  No events overnight.  Tolerated dialysis yesterday. Vitals this a.m. significant for some hypertension in the 150s and very mild tachycardia And sugars remain somewhat elevated overnight and this a.m. in the mid 200s Still with dizziness but says he just got medication for it.   ROS: Denies fevers, chills, N/V, abdominal pain, constipation, diarrhea, SOB, cough, chest pain, new weakness or paraesthesias.   + Vertigo/dizziness   Objective:   No results found. Recent Labs    11/23/24 1549 11/24/24 1156  WBC 21.5* 23.7*  HGB 8.3* 8.8*  HCT 25.1* 26.3*  PLT 233 226   Recent Labs    11/23/24 1549 11/24/24 1156  NA 131* 130*  K 3.9 4.3  CL 94* 93*  CO2 27 25  GLUCOSE 179* 232*  BUN 32* 39*  CREATININE 5.91* 6.73*  CALCIUM 8.2* 8.3*    Intake/Output Summary (Last 24 hours) at 11/25/2024 0756 Last data filed at 11/25/2024 0715 Gross per 24 hour  Intake 60 ml  Output 2025 ml  Net -1965 ml        Physical Exam: Vital Signs Blood pressure (!) 150/68, pulse 66, temperature 98.3 F (36.8 C), temperature source Oral, resp. rate 18, height 6' 2 (1.88 m), weight 123.8 kg, SpO2 100%. Constitutional: No apparent distress. Appropriate appearance for age.  Sitting up at bedside. HENT: No JVD. Neck Supple. Trachea midline. Atraumatic, normocephalic. Eyes: PERRLA. EOMI. Visual fields grossly intact.  No notable nystagmus with EOMI.  Cardiovascular: RRR, no murmurs/rub/gallops.1+ BL LE Edema. Peripheral pulses 1+  Respiratory: CTAB. No rales, rhonchi, or wheezing. On RA.  Abdomen: + bowel sounds, normoactive. No distention or tenderness.  Skin:   Scabbed areas on Right upper chest wall at site of prior internal jugular.  Psoriasis, flaky skin bilateral palms and feet. To a lesser extent there are findings on legs.  No apparent wounds between toes Callus under right great toe.   Tunneled dialysis cath left internal jugular.  Perineal wound packed--unable to examine today due to positioning/clothing     MSK:  No apparent deformity  Neurologic exam:  Alert and oriented x 3.  Assessment limited by severe vertigo. Good recall of recent events, but flat and fairly self reserved complicating cognitive assessment Fair insight and awareness Normal speech and language. Cranial nerves II through XII intact 4-5 strength distal and proximal bilateral upper and lower extremities. Peripheral neuropathy in bilateral feet distal to the ankle worse on the left No tone Reflexes intact No ataxia  Physical exam unchanged from the above on reexamination 11/25/2024   Assessment/Plan: 1. Functional deficits which require 3+ hours per day of interdisciplinary therapy in a comprehensive inpatient rehab setting. Physiatrist is providing close team supervision and 24 hour management of active medical problems listed below. Physiatrist and rehab team continue to assess barriers to discharge/monitor patient progress toward functional and medical goals  Care Tool:  Bathing    Body parts bathed by patient: Right arm, Left arm, Chest, Abdomen, Front perineal area, Right upper leg, Left upper leg, Face   Body parts bathed by helper: Left lower leg, Right lower leg, Buttocks     Bathing assist  Assist Level: Moderate Assistance - Patient 50 - 74%     Upper Body Dressing/Undressing Upper body dressing   What is the patient wearing?: Pull over shirt    Upper body assist Assist Level: Set up assist    Lower Body Dressing/Undressing Lower body dressing      What is the patient wearing?: Pants     Lower body assist Assist for lower body dressing: Maximal Assistance - Patient 25 - 49%     Toileting Toileting    Toileting assist Assist for toileting: Moderate Assistance - Patient 50 - 74%     Transfers Chair/bed transfer  Transfers assist  Chair/bed transfer activity  did not occur: Safety/medical concerns  Chair/bed transfer assist level: Minimal Assistance - Patient > 75%     Locomotion Ambulation   Ambulation assist      Assist level: Minimal Assistance - Patient > 75% Assistive device: Walker-rolling Max distance: 24'   Walk 10 feet activity   Assist     Assist level: Minimal Assistance - Patient > 75% Assistive device: Walker-rolling   Walk 50 feet activity   Assist Walk 50 feet with 2 turns activity did not occur: Safety/medical concerns         Walk 150 feet activity   Assist Walk 150 feet activity did not occur: Safety/medical concerns         Walk 10 feet on uneven surface  activity   Assist Walk 10 feet on uneven surfaces activity did not occur: Safety/medical concerns         Wheelchair     Assist Is the patient using a wheelchair?: Yes Type of Wheelchair: Manual    Wheelchair assist level: Dependent - Patient 0%      Wheelchair 50 feet with 2 turns activity    Assist        Assist Level: Dependent - Patient 0%   Wheelchair 150 feet activity     Assist      Assist Level: Dependent - Patient 0%   Blood pressure (!) 150/68, pulse 66, temperature 98.3 F (36.8 C), temperature source Oral, resp. rate 18, height 6' 2 (1.88 m), weight 123.8 kg, SpO2 100%.   Medical Problem List and Plan: 1. Functional deficits secondary to debility due subacute TBI with SDH s/p fall             -patient may  shower             -ELOS/Goals: 10-12 days, supervision to mod I goals  -Stable to continue CIR  2.  Antithrombotics: -DVT/anticoagulation:  Pharmaceutical: Heparin              -antiplatelet therapy: N/A  3. Pain Management: Tylenol  prn.  4. Mood/Behavior/Sleep: LCSW to follow for evaluation and support.              -antipsychotic agents: N/A             -pt struggling to sleep d/t visits by staff. Try to limit HS interruptions while on rehab. He denies that pain or his CPAP machine  are contributing to problem.              -check sleep chart     1/6: Add as needed melatonin 5 mg at bedtime   - slept well  5. Neuropsych/cognition: This patient may be capable of making decisions on his own behalf.   - 1/6: Cog eval limiting - patient with limited participation and dizziness - SLP not noting true  cog deficits so not picking up for ongoing therapies  6. Skin/Wound Care: Routine pressure relief measures             -eucerin cream to bilateral hands and feet bid, pt states he's been dealing with it for several months. ?psoriasis   - R toe abrasion   - Wound care in place for scrotum/peroneal wound  7. Fluids/Electrolytes/Nutrition: Strict I/O. Labs with HD   - AM labs with stable hyponatremia; monitor w/ dialysis  8. MSSA/ Proteus mirabilis bacteremia: Now on Cefazolin  for 4 total weeks.   - Levaquin  300 mg Q48H added on 01/04 for additional week  9.  T2DM: Hgb A1C- 8.4%. Monitor BS ac/hs and use SSI for elevated BS             --continue Lantus  increased to 25 units on 01/05 and on 5 units TID for meal coverage.              -glucose control is gradually improving  1/6: Monitor BG with increased activity  1/7: Eating 25 to 80% of meals.  Cautious to overdo meal coverage given variability.  Increase Lantus  to 30 units daily.  Recent Labs    11/24/24 1941 11/24/24 2214 11/25/24 0600  GLUCAP 134* 207* 261*      10. HTN: BP has been on low side likely due to sepsis.  --On coreg  6.25 mg bid. Continue to hold Norvasc .  -1-6 stable--monitor closely and get orthostatics in a.m. 1/7: Mildly hypertensive this a.m.  Variability with dialysis, will add as needed hydralazine  for SBP greater than 170 and monitor    11/25/2024    6:20 AM 11/25/2024    6:03 AM 11/24/2024   10:15 PM  Vitals with BMI  Systolic 150 150   Diastolic 68 68   Pulse 66 66 56    11. Fournier's gangrene s/p I & D 08/2024: Continue to pack wound bid.   12. Anemia of chronic disease: On weekly  aranesp . Continue to monitor H/H.  13.  OSA: Continue CPAP  14. Constipation: On Miralax  and senna but refusing second dose-->will simplify regimen.   - LBM 1/5, large  15. ESRD. Dialysis T TH Sat.   - Makes small amount of urine, incontinent at baseline  16. Vertigo/Dizziness -likely central vertigo but may be complicated by orthostasis -- Reports occipital HA, blurry vision and dizziness once up-vestibular eval pending - 1/6 Add PRN meclizine  12.5 mg TID, scopolamine  patch  LOS: 2 days A FACE TO FACE EVALUATION WAS PERFORMED  Brendan [name] 11/25/2024, 7:56 AM     "

## 2024-11-25 NOTE — Care Management (Signed)
 Inpatient Rehabilitation Center Individual Statement of Services  Patient Name:  Brendan [name].  Date:  11/25/2024  Welcome to the Inpatient Rehabilitation Center.  Our goal is to provide you with an individualized program based on your diagnosis and situation, designed to meet your specific needs.  With this comprehensive rehabilitation program, you will be expected to participate in at least 3 hours of rehabilitation therapies Monday-Friday, with modified therapy programming on the weekends.  Your rehabilitation program will include the following services:  Physical Therapy (PT), Occupational Therapy (OT), 24 hour per day rehabilitation nursing, Therapeutic Recreaction (TR), Psychology, Neuropsychology, Care Coordinator, Rehabilitation Medicine, Nutrition Services, Pharmacy Services, and Other  Weekly team conferences will be held on Tuesday to discuss your progress.  Your Inpatient Rehabilitation Care Coordinator will talk with you frequently to get your input and to update you on team discussions.  Team conferences with you and your family in attendance may also be held.  Expected length of stay: 7-12 days    Overall anticipated outcome: Independent with Assistive Device  Depending on your progress and recovery, your program may change. Your Inpatient Rehabilitation Care Coordinator will coordinate services and will keep you informed of any changes. Your Inpatient Rehabilitation Care Coordinator's name and contact numbers are listed  below.  The following services may also be recommended but are not provided by the Inpatient Rehabilitation Center:  Driving Evaluations Home Health Rehabiltiation Services Outpatient Rehabilitation Services Vocational Rehabilitation   Arrangements will be made to provide these services after discharge if needed.  Arrangements include referral to agencies that provide these services.  Your insurance has been verified to be:  [employer]  Your primary  doctor is:  Tanda Bame  Pertinent information will be shared with your doctor and your insurance company.  Inpatient Rehabilitation Care Coordinator:  Graeme Jude, KEN [phone] or ([phone]  Information discussed with and copy given to patient by: [name], 11/25/2024, 7:22 AM

## 2024-11-25 NOTE — Progress Notes (Signed)
 [facility] NEPHROLOGY PROGRESS NOTE    Subjective:  Seen in room No /co's today   Presentation summary: 69 y.o. year-old w/ PMH as below who presented to ED on 12/16 brought by EMS from home after his wife found him unconscious on the floor with a large hematoma in the back of the head.  Also was weak on his right side.  In the ED he started vomiting.  He did go to dialysis yesterday morning and was sent back home at 2:30 PM.  In ED BP was 150/80, HR 78, RR 21, temp 97, 98% sat on room air.  Labs showed K+ 3.6, BUN 14, creatinine 2.4, WBC 17K.  Head CT was concerning for mild contusions. Also a right frontal convexity most c/w meningioma.  Neurosurgery and neurology were consulted and patient was given IV Keppra  load, Benadryl , Zofran , and Reglan  IV.  Patient was admitted for fall with TBI, questionable CVA.  Patient was admitted.  We are asked to see for renal failure.  Objective Vitals:   11/21/24 0500 11/21/24 0536 11/21/24 0828 11/21/24 1154  BP:  134/64 (!) 140/69 (!) 162/68  Pulse:  66 66 74  Resp:      Temp:  98.6 F (37 C) 98 F (36.7 C)   TempSrc:      SpO2:  93% 95% 97%  Weight: 121.9 kg     Height:        Physical Exam:       General adult male, no distress Neck supple trachea midline Lungs clear to auscultation  Heart S1S2 no rub Abdomen soft nontender obese habitus Ext no pitting edema Neuro alert and oriented x 3, follows commands   L internal jugular TDC intact   Outpatient Dialysis Orders:  [facility] - [facility] 3h  B400  125kg   3K bath  TDC  Hep 5000 Mircera 150mcg IV q 2 weeks (100mcg given 12/5) Calcitriol  0.5mcg PO q HD   Assessment/ Plan: Pt is a 69 y.o. yo male with AKI on HD, HTN, T2DM, recent admit for head contusion s/p fall who is being admitted with fever, leukocytosis, and weakness due to sepsis.  He has progressed to ESRD   # ESRD [facility] (note that AKI has progressed to ESRD) - he has been at [facility] - line holiday => tunneled  catheter removed 12/26 and replaced 12/30 - HD per TTS schedule here - next HD tomorrow  # HTN/volume - 2 kg under dry wt  - lower dry wt upon dc - continue fluid restriction of 1.5 liters/day - on coreg  bid  - UF 2-2.5 L next HD   # Anemia of CKD - Continue Aranesp  100 mcg every Sat - follow Hb  # Secondary hyperparathyroidism - phos controlled, not on binders.  on calcitriol .  # Sepsis due to MSSA and Proteus bacteremia - suspected TDC infection - TDC removed by IR 12/26 - s/p TEE (negative) on 12/31 - f/u cx's 12/26 are negative - per ID needs IV cefazolin  w/ HD x 4 weeks thru 1/23  - per ID for proteus, pt gets 1 more week of levofloxacin    Myer Fret  MD  [facility] 11/25/2024, 5:09 PM  Recent Labs  Lab 11/23/24 1549 11/24/24 1156  HGB 8.3* 8.8*  ALBUMIN  2.4* 2.5*  CALCIUM 8.2* 8.3*  PHOS 4.7* 4.9*  CREATININE 5.91* 6.73*  K 3.9 4.3    Inpatient medications:  calcitRIOL   0.25 mcg Oral Daily   carvedilol   6.25 mg  Oral BID WC   Chlorhexidine  Gluconate Cloth  6 each Topical BID   darbepoetin (ARANESP ) injection - DIALYSIS  100 mcg Subcutaneous Q Tue-1800   dorzolamide   1 drop Both Eyes Daily   heparin   5,000 Units Subcutaneous Q8H   heparin  sodium (porcine)  3,500 Units Intravenous Once   hydrocerin   Topical BID   insulin  aspart  0-5 Units Subcutaneous QHS   insulin  aspart  0-6 Units Subcutaneous TID WC   insulin  aspart  5 Units Subcutaneous TID WC   insulin  glargine-yfgn  30 Units Subcutaneous Daily   levofloxacin   500 mg Oral Q48H   levothyroxine   125 mcg Oral q AM   scopolamine   1 patch Transdermal Q72H   senna-docusate  1 tablet Oral BID   simvastatin   20 mg Oral QPM   timolol   1 drop Both Eyes Daily     ceFAZolin  (ANCEF ) IV 2 g (11/24/24 1746)   acetaminophen , albuterol , bisacodyl , dextrose , diphenhydrAMINE , famotidine , guaiFENesin -dextromethorphan, hydrALAZINE , lactulose , meclizine , melatonin, milk and molasses, prochlorperazine  **OR** prochlorperazine   **OR** prochlorperazine 

## 2024-11-26 DIAGNOSIS — S069XAS Unspecified intracranial injury with loss of consciousness status unknown, sequela: Secondary | ICD-10-CM | POA: Diagnosis not present

## 2024-11-26 LAB — GLUCOSE, CAPILLARY
Glucose-Capillary: 147 mg/dL — ABNORMAL HIGH (ref 70–99)
Glucose-Capillary: 171 mg/dL — ABNORMAL HIGH (ref 70–99)
Glucose-Capillary: 152 mg/dL — ABNORMAL HIGH (ref 70–99)
Glucose-Capillary: 243 mg/dL — ABNORMAL HIGH (ref 70–99)

## 2024-11-26 MED ORDER — HEPARIN SODIUM (PORCINE) 1000 UNIT/ML DIALYSIS: 1500.0000 [IU] | INTRAMUSCULAR | Status: DC | PRN

## 2024-11-26 MED ORDER — AMLODIPINE BESYLATE 2.5 MG PO TABS
2.5000 mg | ORAL_TABLET | Freq: Every day | ORAL | Status: DC
  Administered 2024-11-26 – 2024-12-01 (×6): 2.5 mg via ORAL
  Filled 2024-11-26 (×6): qty 1

## 2024-11-26 MED ORDER — DICLOFENAC SODIUM 1 % EX GEL
2.0000 g | Freq: Four times a day (QID) | CUTANEOUS | Status: DC
  Administered 2024-11-26 – 2024-12-03 (×25): 2 g via TOPICAL
  Filled 2024-11-26: qty 100

## 2024-11-26 MED ORDER — NEPRO/CARBSTEADY PO LIQD
237.0000 mL | ORAL | Status: DC | PRN
  Filled 2024-11-26: qty 237

## 2024-11-26 MED ORDER — SCOPOLAMINE 1 MG/3DAYS TD PT72
1.0000 | MEDICATED_PATCH | TRANSDERMAL | Status: DC
  Administered 2024-11-26 – 2024-12-02 (×3): 1 mg via TRANSDERMAL
  Filled 2024-11-26 (×3): qty 1

## 2024-11-26 MED ORDER — PENTAFLUOROPROP-TETRAFLUOROETH EX AERO: 1.0000 | INHALATION_SPRAY | CUTANEOUS | Status: DC | PRN

## 2024-11-26 MED ORDER — HEPARIN SODIUM (PORCINE) 1000 UNIT/ML IJ SOLN
INTRAMUSCULAR | Status: AC
  Filled 2024-11-26: qty 4

## 2024-11-26 MED ORDER — ANTICOAGULANT SODIUM CITRATE 4% (200MG/5ML) IV SOLN: 5.0000 mL | Status: DC | PRN

## 2024-11-26 MED ORDER — LIDOCAINE HCL (PF) 1 % IJ SOLN: 5.0000 mL | INTRAMUSCULAR | Status: DC | PRN

## 2024-11-26 MED ORDER — HEPARIN SODIUM (PORCINE) 1000 UNIT/ML DIALYSIS: 3500.0000 [IU] | Freq: Once | INTRAMUSCULAR | Status: DC

## 2024-11-26 MED ORDER — HEPARIN SODIUM (PORCINE) 1000 UNIT/ML DIALYSIS
1000.0000 [IU] | INTRAMUSCULAR | Status: DC | PRN
  Administered 2024-11-26: 4600 [IU]

## 2024-11-26 MED ORDER — ALTEPLASE 2 MG IJ SOLR: 2.0000 mg | Freq: Once | INTRAMUSCULAR | Status: DC | PRN

## 2024-11-26 MED ORDER — LIDOCAINE 5 % EX PTCH
1.0000 | MEDICATED_PATCH | Freq: Every day | CUTANEOUS | Status: DC | PRN
  Administered 2024-11-30: 1 via TRANSDERMAL
  Filled 2024-11-26: qty 1

## 2024-11-26 MED ORDER — LIDOCAINE-PRILOCAINE 2.5-2.5 % EX CREA: 1.0000 | TOPICAL_CREAM | CUTANEOUS | Status: DC | PRN

## 2024-11-26 NOTE — Progress Notes (Signed)
 Patient ID: Brendan [name]., male   DOB: Oct 26, 1956, 69 y.o.   MRN: [id number]  SW went to pt room to deliver SOS but pt off floor due to dialysis. SW will follow-up to complete assessment.   Graeme Jude, MSW, LCSW Office: [phone] Cell: [phone] Fax: [phone]

## 2024-11-26 NOTE — IPOC Note (Signed)
 Overall Plan of Care [facility]) Patient Details Name: Brendan [name]. MRN: [id number] DOB: 1956/04/09  Admitting Diagnosis: TBI (traumatic brain injury) [facility])  Hospital Problems: Principal Problem:   TBI (traumatic brain injury) (HCC) Active Problems:   Debility     Functional Problem List: Nursing Bowel, Edema, Endurance, Medication Management, Pain, Skin Integrity  PT Balance, Endurance, Behavior, Safety, Sensory, Perception, Motor, Pain  OT Balance, Pain, Safety, Sensory, Endurance, Skin Integrity, Vision  SLP  (n/a skilled ST not warranted)  TR         Basic ADLs: OT Grooming, Bathing, Dressing, Toileting     Advanced  ADLs: OT Simple Meal Preparation     Transfers: PT Bed Mobility, Bed to Chair, Customer Service Manager, Tub/Shower     Locomotion: PT Ambulation, Stairs     Additional Impairments: OT Fuctional Use of Upper Extremity  SLP        TR      Anticipated Outcomes Item Anticipated Outcome  Self Feeding Modified Independent  Swallowing      Basic self-care  Modified Independent  Toileting  Modified Independent   Bathroom Transfers Modified Independent/ supervision  Bowel/Bladder  manage bowel elimination with medications  Transfers  modI  Locomotion  supervision ambulatory  Communication     Cognition     Pain  <4 w/ prns  Safety/Judgment  manage safety with mod I supervision assistance   Therapy Plan: PT Intensity: Minimum of 1-2 x/day ,45 to 90 minutes PT Frequency: 5 out of 7 days PT Duration Estimated Length of Stay: 10-12 days OT Intensity: Minimum of 1-2 x/day, 45 to 90 minutes OT Frequency: 5 out of 7 days OT Duration/Estimated Length of Stay: 7-10 SLP Intensity:  (n/a skilled ST not warranted) SLP Frequency:  (n/a skilled ST not warranted) SLP Duration/Estimated Length of Stay: n/a skilled ST not warranted   Team Interventions: Nursing Interventions Patient/Family Education, Skin Care/Wound Management, Bowel Management, Disease  Management/Prevention, Pain Management, Medication Management, Discharge Planning  PT interventions Discharge planning, Ambulation/gait training, DME/adaptive equipment instruction, Community reintegration, Neuromuscular re-education, Stair training, Psychosocial support, UE/LE Strength taining/ROM, Wheelchair propulsion/positioning, Warden/ranger, Pain management, Therapeutic Activities, UE/LE Coordination activities, Functional mobility training, Disease management/prevention, Patient/family education, Splinting/orthotics, Therapeutic Exercise  OT Interventions Balance/vestibular training, Discharge planning, Pain management, Self Care/advanced ADL retraining, Therapeutic Activities, UE/LE Coordination activities, Visual/perceptual remediation/compensation, Therapeutic Exercise, Skin care/wound managment, Patient/family education, Functional mobility training, Disease mangement/prevention, Cognitive remediation/compensation, Community reintegration, Fish Farm Manager, Psychosocial support, UE/LE Strength taining/ROM  SLP Interventions  (n/a skilled ST not warranted)  TR Interventions    SW/CM Interventions Discharge Planning, Psychosocial Support, Patient/Family Education   Barriers to Discharge MD  Medical stability, Home enviroment access/loayout, Incontinence, Wound care, Lack of/limited family support, and Hemodialysis  Nursing Home environment access/layout, Decreased caregiver support, IV antibiotics, Wound Care Discharge: House  Discharge Home Layout: One level  Discharge Home Access: Stairs to enter  Entrance Stairs-Rails: Can reach both  Entrance Stairs-Number of Steps: 4  PT Decreased caregiver support, Inaccessible home environment 4 STE, unsure level of support at home  OT Hemodialysis, Wound Care    SLP  (n/a skilled ST not warranted)    SW Decreased caregiver support, Lack of/limited family support, Community Education Officer for SNF coverage     Team Discharge  Planning: Destination: PT-Home ,OT- Home , SLP-Home Projected Follow-up: PT-Home health PT, OT-  Home health OT, SLP-None Projected Equipment Needs: PT-To be determined, OT- To be determined, SLP-None recommended by SLP Equipment Details: PT- , OT-possible need  for shower chair Patient/family involved in discharge planning: PT- Patient,  OT-Patient, SLP-Patient  MD ELOS: 10-12 days Medical Rehab Prognosis:  Good Assessment: The patient has been admitted for CIR therapies with the diagnosis of TBI. The team will be addressing functional mobility, strength, stamina, balance, safety, adaptive techniques and equipment, self-care, bowel and bladder mgt, patient and caregiver education,  . Goals have been set at supervision. Anticipated discharge destination is home.       See Team Conference Notes for weekly updates to the plan of care

## 2024-11-26 NOTE — Progress Notes (Signed)
 Occupational Therapy Session Note  Patient Details  Name: Brendan [name]. MRN: [id number] Date of Birth: 31-Jul-1956  Today's Date: 11/26/2024 OT Individual Time: 0900-1010 OT Individual Time Calculation (min): 70 min    Short Term Goals: Week 1:  OT Short Term Goal 1 (Week 1): Patient to perform grooming sitting at the sink with Set up OT Short Term Goal 2 (Week 1): Patient to perform LE dressing with AE and Min assist OT Short Term Goal 3 (Week 1): Patient to perform toileting with CGA OT Short Term Goal 4 (Week 1): Patient to perform shower transfer with SBA.  Skilled Therapeutic Interventions/Progress Updates:    1:1 Pt received in the w/c. Pt reports his dizziness and vision are still effected from brain injury from fall. Reports he only sees blurry images- nothing is clear.   Taken to the dayroom and transferred to the mat with close supervision to contact guard. Therapeutic activity to work on endurance, overall strengthening. Performed 10-12 chest presses, 10-12 over head presses, 10-12 triceps pulls (overhead), 10-12 sit to stands, 10-12 standing<>squats. Preformed circuit exercise twice. Pt ambulated ~ 65 feet before needing a seated rest break. Pt then propelled with feet ~ 35 feet before fatiguing. Returned to room and left sitting up in w/c to rest before the next session.   Therapy Documentation Precautions:  Precautions Precautions: Fall Recall of Precautions/Restrictions: Intact Precaution/Restrictions Comments: watch BP, HD fluid restriction Restrictions Weight Bearing Restrictions Per Provider Order: No Other Position/Activity Restrictions: increased dizziness with transitions supine-sit-stand; not orthostatic on eval.  Pain:  No c/o pain   Therapy/Group: Individual Therapy  Claudene Nest [facility] 11/26/2024, 11:55 AM

## 2024-11-26 NOTE — Progress Notes (Signed)
 " [facility] Progress Note   Subjective:    Seen and examined patient at bedside. He reports feeling well with no complaints. Plan for HD this afternoon.  Objective Vitals:   11/26/24 1251 11/26/24 1330 11/26/24 1347 11/26/24 1400  BP: (!) 150/66 (!) 142/52 (!) 142/74 137/68  Pulse: 65 60 (!) 55 60  Resp: 14 20 12 14   Temp: 97.7 F (36.5 C)     TempSrc:      SpO2: 96% 97% 98% 95%  Weight: 123.8 kg     Height:       Physical Exam General adult male, no distress Lungs clear to auscultation  Heart S1S2 no rub Abdomen soft nontender obese habitus Ext no pitting edema Neuro alert and oriented x 3, follows commands  L internal jugular TDC intact  Filed Weights   11/24/24 1848 11/26/24 0540 11/26/24 1251  Weight: 123.8 kg 122.8 kg 123.8 kg    Intake/Output Summary (Last 24 hours) at 11/26/2024 1418 Last data filed at 11/26/2024 0630 Gross per 24 hour  Intake --  Output 50 ml  Net -50 ml    Additional Objective Labs: Basic Metabolic Panel: Recent Labs  Lab 11/23/24 0431 11/23/24 1549 11/24/24 1156  NA 130* 131* 130*  K 4.0 3.9 4.3  CL 94* 94* 93*  CO2 25 27 25   GLUCOSE 175* 179* 232*  BUN 29* 32* 39*  CREATININE 5.20* 5.91* 6.73*  CALCIUM 8.2* 8.2* 8.3*  PHOS 4.4 4.7* 4.9*   Liver Function Tests: Recent Labs  Lab 11/22/24 0600 11/22/24 0601 11/23/24 0431 11/23/24 1549 11/24/24 1156  AST 12*  --   --   --   --   ALT <5  --   --   --   --   ALKPHOS 101  --   --   --   --   BILITOT 0.6  --   --   --   --   PROT 6.2*  --   --   --   --   ALBUMIN  2.4*   < > 2.2* 2.4* 2.5*   < > = values in this interval not displayed.   No results for input(s): LIPASE, AMYLASE in the last 168 hours. CBC: Recent Labs  Lab 11/21/24 1510 11/22/24 0600 11/23/24 0431 11/23/24 1549 11/24/24 1156  WBC 32.8* 24.1* 21.8* 21.5* 23.7*  NEUTROABS  --  19.3* 19.0*  --  20.7*  HGB 8.9* 8.4* 8.1* 8.3* 8.8*  HCT 26.4* 25.0* 24.5* 25.1* 26.3*  MCV 83.8 82.8  83.6 83.7 82.7  PLT 273 241 220 233 226   Blood Culture    Component Value Date/Time   SDES BLOOD RIGHT HAND 11/13/2024 0729   SDES BLOOD LEFT ARM 11/13/2024 0729   SPECREQUEST  11/13/2024 0729    BOTTLES DRAWN AEROBIC AND ANAEROBIC Blood Culture results may not be optimal due to an inadequate volume of blood received in culture bottles   SPECREQUEST  11/13/2024 0729    BOTTLES DRAWN AEROBIC AND ANAEROBIC Blood Culture results may not be optimal due to an inadequate volume of blood received in culture bottles   CULT  11/13/2024 0729    NO GROWTH 5 DAYS Performed at [facility], [address] [place], [place] [address]    CULT  11/13/2024 0729    NO GROWTH 5 DAYS Performed at [facility], [address], [place], [place] [address]    REPTSTATUS 11/18/2024 FINAL 11/13/2024 0729   REPTSTATUS 11/18/2024  FINAL 11/13/2024 0729    Cardiac Enzymes: No results for input(s): CKTOTAL, CKMB, CKMBINDEX, TROPONINI in the last 168 hours. CBG: Recent Labs  Lab 11/25/24 1204 11/25/24 1608 11/25/24 2107 11/26/24 0535 11/26/24 1153  GLUCAP 187* 199* 165* 147* 171*   Iron  Studies: No results for input(s): IRON , TIBC, TRANSFERRIN, FERRITIN in the last 72 hours. Lab Results  Component Value Date   INR 1.0 08/28/2024   Studies/Results: No results found.  Medications:  anticoagulant sodium citrate       ceFAZolin  (ANCEF ) IV 2 g (11/24/24 1746)    amLODipine   2.5 mg Oral Daily   calcitRIOL   0.25 mcg Oral Daily   carvedilol   6.25 mg Oral BID WC   Chlorhexidine  Gluconate Cloth  6 each Topical BID   Chlorhexidine  Gluconate Cloth  6 each Topical Q0600   darbepoetin (ARANESP ) injection - DIALYSIS  100 mcg Subcutaneous Q Tue-1800   dorzolamide   1 drop Both Eyes Daily   [START ON 11/27/2024] heparin   3,500 Units Dialysis Once in dialysis   heparin   5,000 Units Subcutaneous Q8H   heparin  sodium (porcine)  3,500 Units Intravenous Once   hydrocerin   Topical BID    insulin  aspart  0-5 Units Subcutaneous QHS   insulin  aspart  0-6 Units Subcutaneous TID WC   insulin  aspart  5 Units Subcutaneous TID WC   insulin  glargine-yfgn  30 Units Subcutaneous Daily   levofloxacin   500 mg Oral Q48H   levothyroxine   125 mcg Oral q AM   scopolamine   1 patch Transdermal Q72H   senna-docusate  1 tablet Oral BID   simvastatin   20 mg Oral QPM   timolol   1 drop Both Eyes Daily    Dialysis Orders: [facility] - [facility] 3h  B400  125kg   3K bath  TDC  Hep 5000 Mircera 150mcg IV q 2 weeks (100mcg given 12/5) Calcitriol  0.5mcg PO q HD   Assessment/Plan: # ESRD [facility] (note that AKI has progressed to ESRD) - he has been at [facility] - line holiday => tunneled catheter removed 12/26 and replaced 12/30 - HD per TTS schedule here - next HD this afternoon   # HTN/volume - 2 kg under dry wt  - lower dry wt upon dc - continue fluid restriction of 1.5 liters/day - on coreg  bid  - UF 2-2.5 L next HD    # Anemia of CKD - Continue Aranesp  100 mcg every Sat - follow Hb   # Secondary hyperparathyroidism - phos controlled, not on binders.  on calcitriol .   # Sepsis due to MSSA and Proteus bacteremia - suspected TDC infection - TDC removed by IR 12/26 - s/p TEE (negative) on 12/31 - f/u cx's 12/26 are negative - per ID needs IV cefazolin  w/ HD x 4 weeks thru 1/23  - per ID for proteus, pt gets 1 more week of levofloxacin    Charmaine Piety, NP [facility] 11/26/2024,2:18 PM  LOS: 3 days    "

## 2024-11-26 NOTE — Progress Notes (Signed)
 "                                                        PROGRESS NOTE   Subjective/Complaints:  No events overnight.  Patient complaining today of worsening dizziness, although notes his scopolamine  patch did fall off at some point overnight.  He also endorses some blurred vision, equal bilaterally, but no other focal deficits.  States this has been intermittent since his injury.  Is associated with a headache coming from his posterior head, worsened with activity and being at bed.  He is asking questions today about the nature of his injury, fall, subdural hematoma, and prognosis moving forward.  BP remains slightly elevated systolic 140-150s; otherwise vitals stable  LBM 1/6, makes small amount of urine, BG much better controlled <200.  ROS: Denies fevers, chills, N/V, abdominal pain, constipation, diarrhea, SOB, cough, chest pain, new weakness or paraesthesias.   + Vertigo/dizziness + Headache + Blurred vision   Objective:   No results found. Recent Labs    11/23/24 1549 11/24/24 1156  WBC 21.5* 23.7*  HGB 8.3* 8.8*  HCT 25.1* 26.3*  PLT 233 226   Recent Labs    11/23/24 1549 11/24/24 1156  NA 131* 130*  K 3.9 4.3  CL 94* 93*  CO2 27 25  GLUCOSE 179* 232*  BUN 32* 39*  CREATININE 5.91* 6.73*  CALCIUM 8.2* 8.3*    Intake/Output Summary (Last 24 hours) at 11/26/2024 0934 Last data filed at 11/26/2024 0630 Gross per 24 hour  Intake 240 ml  Output 75 ml  Net 165 ml        Physical Exam: Vital Signs Blood pressure (!) 146/63, pulse 63, temperature 98.3 F (36.8 C), resp. rate 17, height 6' 2 (1.88 m), weight 122.8 kg, SpO2 99%. Constitutional: No apparent distress. Appropriate appearance for age.  Working in therapy gym on bicycle. HENT: No JVD. Neck Supple. Trachea midline. Atraumatic, normocephalic. Eyes: PERRLA. EOMI. Visual fields grossly intact.  No notable nystagmus with EOMI. + Generalized blurring of vision but no specific deficits  Cardiovascular:  RRR, no murmurs/rub/gallops.1+ BL LE Edema. Peripheral pulses 1+  Respiratory: CTAB. No rales, rhonchi, or wheezing. On RA.  Abdomen: + bowel sounds, normoactive. No distention or tenderness.  Skin: Visible areas intact 1-8; prior assessments as below: Scabbed areas on Right upper chest wall at site of prior internal jugular.  Psoriasis, flaky skin bilateral palms and feet. To a lesser extent there are findings on legs.  No apparent wounds between toes Callus under right great toe.  Tunneled dialysis cath left internal jugular.  Perineal wound packed--unable to examine today due to positioning/clothing     MSK:  No apparent deformity.  Tenderness to palpation with radiation of headache on right greater than left cervical paraspinals and occiput.  Neurologic exam:  Alert and oriented x 3.    Able to follow all commands with improved reaction time and comprehension. Mild memory deficits Good insight and awareness Normal speech and language. Cranial nerves II through XII intact 4-5 strength distal and proximal bilateral upper and lower extremities. Peripheral neuropathy in bilateral feet distal to the ankle worse on the left No tone Reflexes intact No ataxia  Assessment/Plan: 1. Functional deficits which require 3+ hours per day of interdisciplinary therapy in a comprehensive inpatient rehab setting. Physiatrist  is providing close team supervision and 24 hour management of active medical problems listed below. Physiatrist and rehab team continue to assess barriers to discharge/monitor patient progress toward functional and medical goals  Care Tool:  Bathing    Body parts bathed by patient: Right arm, Left arm, Chest, Abdomen, Front perineal area, Right upper leg, Left upper leg, Face   Body parts bathed by helper: Left lower leg, Right lower leg, Buttocks     Bathing assist Assist Level: Moderate Assistance - Patient 50 - 74%     Upper Body Dressing/Undressing Upper body  dressing   What is the patient wearing?: Pull over shirt    Upper body assist Assist Level: Set up assist    Lower Body Dressing/Undressing Lower body dressing      What is the patient wearing?: Pants     Lower body assist Assist for lower body dressing: Maximal Assistance - Patient 25 - 49%     Toileting Toileting    Toileting assist Assist for toileting: Moderate Assistance - Patient 50 - 74%     Transfers Chair/bed transfer  Transfers assist  Chair/bed transfer activity did not occur: Safety/medical concerns  Chair/bed transfer assist level: Contact Guard/Touching assist     Locomotion Ambulation   Ambulation assist      Assist level: Minimal Assistance - Patient > 75% Assistive device: Walker-rolling Max distance: 24'   Walk 10 feet activity   Assist     Assist level: Minimal Assistance - Patient > 75% Assistive device: Walker-rolling   Walk 50 feet activity   Assist Walk 50 feet with 2 turns activity did not occur: Safety/medical concerns         Walk 150 feet activity   Assist Walk 150 feet activity did not occur: Safety/medical concerns         Walk 10 feet on uneven surface  activity   Assist Walk 10 feet on uneven surfaces activity did not occur: Safety/medical concerns         Wheelchair     Assist Is the patient using a wheelchair?: Yes Type of Wheelchair: Manual    Wheelchair assist level: Dependent - Patient 0%      Wheelchair 50 feet with 2 turns activity    Assist        Assist Level: Dependent - Patient 0%   Wheelchair 150 feet activity     Assist      Assist Level: Dependent - Patient 0%   Blood pressure (!) 146/63, pulse 63, temperature 98.3 F (36.8 C), resp. rate 17, height 6' 2 (1.88 m), weight 122.8 kg, SpO2 99%.   Medical Problem List and Plan: 1. Functional deficits secondary to debility due subacute TBI with SDH s/p fall             -patient may  shower              -ELOS/Goals: 10-12 days, supervision to mod I goals  -Stable to continue CIR  2.  Antithrombotics: -DVT/anticoagulation:  Pharmaceutical: Heparin              -antiplatelet therapy: N/A  3. Pain Management: Tylenol  prn.   1-8: Tension headache; add Voltaren  gel 4 times daily and as needed lidocaine  patches to posterior neck  4. Mood/Behavior/Sleep: LCSW to follow for evaluation and support.              -antipsychotic agents: N/A             -pt struggling  to sleep d/t visits by staff. Try to limit HS interruptions while on rehab. He denies that pain or his CPAP machine are contributing to problem.              -check sleep chart     1/6: Add as needed melatonin 5 mg at bedtime   - slept well  5. Neuropsych/cognition: This patient may be capable of making decisions on his own behalf.   - 1/6: Cog eval limiting - patient with limited participation and dizziness - SLP not noting true cog deficits so not picking up for ongoing therapies  6. Skin/Wound Care: Routine pressure relief measures             -eucerin cream to bilateral hands and feet bid, pt states he's been dealing with it for several months. psoriasis-- follows with Dr. Tarfeen (dermatology) for the past 6 years    - R toe abrasion   - Wound care in place for scrotum/peroneal wound  7. Fluids/Electrolytes/Nutrition: Strict I/O. Labs with HD   - AM labs with stable hyponatremia; monitor w/ dialysis  8. MSSA/ Proteus mirabilis bacteremia/monocytosis: cefazolin  (changed from Cefepime  on 1/3 by ID) with HD till 1/9 to complete 2 weeks following line removal   - Levaquin  300 mg Q48H added on 01/04 for additional week  - 1/8: Per Dr Overton, patient with persistent monocytosis, would not change regimen unless s/s infection -   need hematology outpatient f/u on discharge  9.  T2DM: Hgb A1C- 8.4%. Monitor BS ac/hs and use SSI for elevated BS             --continue Lantus  increased to 25 units on 01/05 and on 5 units TID for meal  coverage.              -glucose control is gradually improving  1/6: Monitor BG with increased activity  1/7: Eating 25 to 80% of meals.  Cautious to overdo meal coverage given variability.  Increase Lantus  to 30 units daily.  1-8: Blood sugars looking better, monitor today  Recent Labs    11/25/24 1608 11/25/24 2107 11/26/24 0535  GLUCAP 199* 165* 147*      10. HTN: BP has been on low side likely due to sepsis.  --On coreg  6.25 mg bid. Continue to hold Norvasc .  -1-6 stable--monitor closely and get orthostatics in a.m. 1/7: Mildly hypertensive this a.m.  Variability with dialysis, will add as needed hydralazine  for SBP greater than 170 and monitor 1/8: resume norvasc  at low dose 2.5 mg daily. Continue to hold torsemide     11/26/2024    5:40 AM 11/26/2024    5:39 AM 11/25/2024    9:09 PM  Vitals with BMI  Weight 270 lbs 12 oz    BMI 34.74    Systolic  146 152  Diastolic  63 70  Pulse  63 61    11. Fournier's gangrene s/p I & D 08/2024: Continue to pack wound bid.   12. Anemia of chronic disease: On weekly aranesp . Continue to monitor H/H. - Labs with dialysis  13.  OSA: Continue CPAP  14. Constipation: On Miralax  and senna but refusing second dose-->will simplify regimen.   - LBM 1/5, large  15. ESRD. Dialysis T TH Sat.   - Makes small amount of urine, incontinent at baseline  16. Vertigo/Dizziness -likely central vertigo but may be complicated by orthostasis -- Reports occipital HA, blurry vision and dizziness once up-vestibular eval pending - 1/6 Add PRN meclizine  12.5  mg TID, scopolamine  patch - 1-8: Worsening vertigo today, but appears scopolamine  patch fell off; ordered replacement  LOS: 3 days A FACE TO FACE EVALUATION WAS PERFORMED  Brendan [name] 11/26/2024, 9:34 AM     "

## 2024-11-26 NOTE — Progress Notes (Signed)
 Physical Therapy Session Note  Patient Details  Name: Brendan [name]. MRN: [id number] Date of Birth: 11/25/1955  Today's Date: 11/26/2024 PT Individual Time: 9196-9155 PT Individual Time Calculation (min): 41 min   Today's Date: 11/26/2024 PT Individual Time: 1049-1200 PT Individual Time Calculation (min): 71 min   Short Term Goals: Week 1:  PT Short Term Goal 1 (Week 1): Pt will complete transfer with supervision with LRAD PT Short Term Goal 2 (Week 1): Pt will ambulate 100' with LRAD and CGA PT Short Term Goal 3 (Week 1): Pt will complete up/down 4 steps with min assist and BHRs  Skilled Therapeutic Interventions/Progress Updates:     1st Session: Pt received seated at EOB and agrees to therapy. No complaint of pain. PT provides setup assistnace for upper and lower body dressing and pt completes while seated at EOB, working on sitting balance and activity tolerance. Stand step to [facility] with RW and cues for posture and positioning. WC transport to gym. Pt performs sit to stand in parallel bars x3 with cues for hand placement and body mechanics. Pt completes 3x20 reps heel raises for strengthening of lower legs and extensor muscle groups, as well as balance. PT provides cues for correct performance and NM feedback. Extended seated rest breaks between each bout. WC transport back to room. Left seated with all needs within reach .  2nd Session: Pt received seated in [facility] and agrees to therapy No complaint of pain. WC transport to gym. Pt performs sit to stand with minA to promote anterior weigh ttransition. Pt ambulates x175' with RW and uces for upright gaze to improve posture and balance, and decreasing WB through RW for energy conservation. Pt takes extended seated rest break. Pt then stands from slightly elevated mat with RW and cues for initiation. Pt completes 2x20 high knee marches, with cue to tap thighs on cross bar of RW to promote optimal performance. Following extended seated rest break, pt  stands and ambulates x20' to Nustep with RW. Pt completes Nustep for endurance training. Pt completes total of x20:00 active time on Nustep, with several seated rest breaks. PT provides cues for hand and foot placement and completing full available ROM. Pt completes at workload of 5 with average steps per minute ~35. Pt performs stand step back to [facility] with CGA. Left seated with all needs within reach   Therapy Documentation Precautions:  Precautions Precautions: Fall Recall of Precautions/Restrictions: Intact Precaution/Restrictions Comments: watch BP, HD fluid restriction Restrictions Weight Bearing Restrictions Per Provider Order: No Other Position/Activity Restrictions: increased dizziness with transitions supine-sit-stand; not orthostatic on eval.   Therapy/Group: Individual Therapy  Elsie JAYSON Dawn, PT, DPT 11/26/2024, 4:15 PM

## 2024-11-26 NOTE — Plan of Care (Signed)
" °  Problem: Consults Goal: RH GENERAL PATIENT EDUCATION Description: See Patient Education module for education specifics. Outcome: Progressing   Problem: RH BOWEL ELIMINATION Goal: RH STG MANAGE BOWEL WITH ASSISTANCE Description: STG Manage Bowel with mod I aupervision Assistance. Outcome: Progressing   Problem: RH SKIN INTEGRITY Goal: RH STG SKIN FREE OF INFECTION/BREAKDOWN Description: Manage skin free of infections with mod I - supervision assistance  Outcome: Progressing   Problem: RH SAFETY Goal: RH STG ADHERE TO SAFETY PRECAUTIONS W/ASSISTANCE/DEVICE Description: STG Adhere to Safety Precautions With mod I supervision Assistance/Device. Outcome: Progressing   Problem: RH PAIN MANAGEMENT Goal: RH STG PAIN MANAGED AT OR BELOW PT'S PAIN GOAL Description: <4 w/ prns Outcome: Progressing   Problem: RH KNOWLEDGE DEFICIT GENERAL Goal: RH STG INCREASE KNOWLEDGE OF SELF CARE AFTER HOSPITALIZATION Description: Manage increase knowledge of self care after hospitalization with mod I- supervision assistance from wife using educational materials provided Outcome: Progressing   "

## 2024-11-27 DIAGNOSIS — S069XAS Unspecified intracranial injury with loss of consciousness status unknown, sequela: Secondary | ICD-10-CM | POA: Diagnosis not present

## 2024-11-27 LAB — GLUCOSE, CAPILLARY
Glucose-Capillary: 187 mg/dL — ABNORMAL HIGH (ref 70–99)
Glucose-Capillary: 173 mg/dL — ABNORMAL HIGH (ref 70–99)
Glucose-Capillary: 304 mg/dL — ABNORMAL HIGH (ref 70–99)
Glucose-Capillary: 343 mg/dL — ABNORMAL HIGH (ref 70–99)

## 2024-11-27 MED ORDER — INSULIN GLARGINE 100 UNIT/ML ~~LOC~~ SOLN
35.0000 [IU] | Freq: Every day | SUBCUTANEOUS | Status: DC
  Administered 2024-11-28 – 2024-11-29 (×2): 35 [IU] via SUBCUTANEOUS
  Filled 2024-11-27 (×3): qty 0.35

## 2024-11-27 NOTE — Progress Notes (Signed)
 Physical Therapy Session Note  Patient Details  Name: Brendan [name]. MRN: [id number] Date of Birth: [birth year]  Today's Date: 11/27/2024 PT Individual Time: 1050-1200 and 1515-1545 PT Individual Time Calculation (min): 70 min and 30 min  Short Term Goals: Week 1:  PT Short Term Goal 1 (Week 1): Pt will complete transfer with supervision with LRAD PT Short Term Goal 2 (Week 1): Pt will ambulate 100' with LRAD and CGA PT Short Term Goal 3 (Week 1): Pt will complete up/down 4 steps with min assist and BHRs  Skilled Therapeutic Interventions/Progress Updates: Tx1: Pt presented in w/c agreeable to therapy. Pt c/o back pain, noted heat pack in low back in w/c. PTA donned shoes total A for time management. Pt transported to day room for time management and energy conservation. Pt completed stand step transfer to mat with CGA. Participated in Sit to stand from elevated mat with emphasis on avoiding pushing against mat for increased BLE ms recruitment 3 x 5. Pt also participated in alternating toe taps to 4in step 2 x 10. P  Tx2: Pt presented in w/c agreeable to therapy. Pt c/o back pain but states has improved since am. Session focused on general strengthening/conditioning. Pt transported to ortho gym and set up on UBE rolling hills program x 10 min L1-4. Pt required x 2 extended rest breaks due to fatigue. Completed 346 rev at 21 RPM, 1.8 METs. Pt requesting to try heavier weight for bicep curls with pt completing x 10 reps with 10lb weight with significant effort. Discussed using lighter weights increased repetitions and improved endurance. Pt transported back to room at end of session and remained in w/c with call bell within reach and needs met.      Therapy Documentation Precautions:  Precautions Precautions: Fall Recall of Precautions/Restrictions: Intact Precaution/Restrictions Comments: watch BP, HD fluid restriction Restrictions Weight Bearing Restrictions Per Provider Order: No Other  Position/Activity Restrictions: increased dizziness with transitions supine-sit-stand; not orthostatic on eval.  Therapy/Group: Individual Therapy  Stephinie Battisti 11/27/2024, 4:30 PM

## 2024-11-27 NOTE — Progress Notes (Signed)
 Physical Therapy Session Note  Patient Details  Name: Brendan [name]. MRN: [id number] Date of Birth: [birth year]  Today's Date: 11/27/2024 PT Individual Time: 8697-8585 PT Individual Time Calculation (min): 72 min   Short Term Goals: Week 1:  PT Short Term Goal 1 (Week 1): Pt will complete transfer with supervision with LRAD PT Short Term Goal 2 (Week 1): Pt will ambulate 100' with LRAD and CGA PT Short Term Goal 3 (Week 1): Pt will complete up/down 4 steps with min assist and BHRs  Skilled Therapeutic Interventions/Progress Updates:     Pt received seated in WC and agrees to therapy. Reports pain in back. Number not provided. PT provides rest breaks and gentle mobility to manage pain. WC transport to gym. Pt performs sit to stand with CGA and cues for initiation. Pt then ambulates x175' with RW and CGA, with cues for upright gaze to improve posture and balance, and increasing stride length to decrease risk for falls. Seated rest break.   Pt completes sets of heel raises to strengthen leg extensors and provide balance challenge. Pt completes 3x20 with RW and cues for body mechanics and correct performance. Pt then completes 3x20 high knee marches with cue to tap knee on crossbar of RW.   Pt ambulates x15' to Nustep with RW and cues for positioning for transfer. Pt completes 2x5:00 at workload of 5 with average steps per minute ~40. PT provides cues for hand and foot placement and completing full available ROM.   Pt performs stand step back to [facility] with RW and cues for positioning. Left seated in WC with all needs within reach.  Therapy Documentation Precautions:  Precautions Precautions: Fall Recall of Precautions/Restrictions: Intact Precaution/Restrictions Comments: watch BP, HD fluid restriction Restrictions Weight Bearing Restrictions Per Provider Order: No Other Position/Activity Restrictions: increased dizziness with transitions supine-sit-stand; not orthostatic on  eval.   Therapy/Group: Individual Therapy  Elsie JAYSON Dawn, PT, DPT 11/27/2024, 3:53 PM

## 2024-11-27 NOTE — Progress Notes (Addendum)
 " [facility] Progress Note   Subjective:   Seen in room - sitting in wheelchair. No CP/dyspnea. For HD tomorrow  Objective Vitals:   11/26/24 2034 11/27/24 0639 11/27/24 0641 11/27/24 1250  BP: (!) 145/87 132/67  108/66  Pulse: 65 61  63  Resp: 18 18  18   Temp: 98.6 F (37 C) 98.6 F (37 C)  98.5 F (36.9 C)  TempSrc:    Oral  SpO2: 98% 99%  100%  Weight:   121.4 kg   Height:       Physical Exam General: Well appearing, NAD. Room air Heart: RRR Lungs: CTAB Abdomen: soft Extremities: trace BLE edema Dialysis Access: TDC in L chest  Additional Objective Labs: Basic Metabolic Panel: Recent Labs  Lab 11/23/24 0431 11/23/24 1549 11/24/24 1156  NA 130* 131* 130*  K 4.0 3.9 4.3  CL 94* 94* 93*  CO2 25 27 25   GLUCOSE 175* 179* 232*  BUN 29* 32* 39*  CREATININE 5.20* 5.91* 6.73*  CALCIUM 8.2* 8.2* 8.3*  PHOS 4.4 4.7* 4.9*   Liver Function Tests: Recent Labs  Lab 11/22/24 0600 11/22/24 0601 11/23/24 0431 11/23/24 1549 11/24/24 1156  AST 12*  --   --   --   --   ALT <5  --   --   --   --   ALKPHOS 101  --   --   --   --   BILITOT 0.6  --   --   --   --   PROT 6.2*  --   --   --   --   ALBUMIN  2.4*   < > 2.2* 2.4* 2.5*   < > = values in this interval not displayed.   CBC: Recent Labs  Lab 11/21/24 1510 11/22/24 0600 11/23/24 0431 11/23/24 1549 11/24/24 1156  WBC 32.8* 24.1* 21.8* 21.5* 23.7*  NEUTROABS  --  19.3* 19.0*  --  20.7*  HGB 8.9* 8.4* 8.1* 8.3* 8.8*  HCT 26.4* 25.0* 24.5* 25.1* 26.3*  MCV 83.8 82.8 83.6 83.7 82.7  PLT 273 241 220 233 226   Medications:   ceFAZolin  (ANCEF ) IV 2 g (11/26/24 1906)    amLODipine   2.5 mg Oral Daily   calcitRIOL   0.25 mcg Oral Daily   carvedilol   6.25 mg Oral BID WC   Chlorhexidine  Gluconate Cloth  6 each Topical BID   Chlorhexidine  Gluconate Cloth  6 each Topical Q0600   darbepoetin (ARANESP ) injection - DIALYSIS  100 mcg Subcutaneous Q Tue-1800   diclofenac  Sodium  2 g Topical QID    dorzolamide   1 drop Both Eyes Daily   heparin   5,000 Units Subcutaneous Q8H   hydrocerin   Topical BID   insulin  aspart  0-5 Units Subcutaneous QHS   insulin  aspart  0-6 Units Subcutaneous TID WC   insulin  aspart  5 Units Subcutaneous TID WC   [START ON 11/28/2024] insulin  glargine  35 Units Subcutaneous Daily   levofloxacin   500 mg Oral Q48H   levothyroxine   125 mcg Oral q AM   scopolamine   1 patch Transdermal Q72H   senna-docusate  1 tablet Oral BID   simvastatin   20 mg Oral QPM   timolol   1 drop Both Eyes Daily    Dialysis Orders MTuThF - [facility] 3h  B400  125kg   3K bath  TDC  Hep 5000 Mircera 150mcg IV q 2 weeks (100mcg given 12/5) Calcitriol  0.5mcg PO q HD    Assessment/Plan: # ESRD  MTuThF (note that AKI has progressed to ESRD) - he has been at [facility] - S/p line holiday 12/26 to 12/30 - HD per TTS schedule here - next HD tomorrow   # HTN/volume -  will need to lower dry wt upon dc - continue fluid restriction of 1.5 liters/day - on coreg  6.25mg  BID and amlodipine  2.5mg  QHS   # Anemia of ESRD - Continue Aranesp  100 mcg every Sat - follow Hgb   # Secondary hyperparathyroidism -  CorrCa and Phos ok, not on binders. On calcitriol .   # Sepsis due to MSSA and Proteus bacteremia - suspected TDC infection, s/p line holiday. WBC remains high. - per ID needs IV cefazolin  w/ HD x 4 weeks thru 12/11/2024 - per ID, will be finishing Levaquin  course on 11/29/2024   # T2DM   Izetta Boehringer, DEVONNA 11/27/2024, 1:36 PM  [facility]    "

## 2024-11-27 NOTE — Progress Notes (Signed)
 "                                                        PROGRESS NOTE   Subjective/Complaints:  No events overnight.  Remains slightly hypertensive but overall vital stable Fluid intakes adequate Blood sugars elevated 150s to 250s.  Patient has no complaints today, states nausea is better controlled.  Creams are helping with his psoriasis on his hands and feet, although he asks if he can get dermatology injections while he is in the hospital-discussed that this will have to wait until he is outpatient.  Last bowel movement 1-6  CBC ordered with dialysis yesterday not performed.  Will reorder labs for Saturday dialysis.  ROS: Denies fevers, chills, N/V, abdominal pain, constipation, diarrhea, SOB, cough, chest pain, new weakness or paraesthesias.   + Vertigo/dizziness--improved + Headache--resolved + Blurred vision--Mitnick, improved   Objective:   No results found. Recent Labs    11/24/24 1156  WBC 23.7*  HGB 8.8*  HCT 26.3*  PLT 226   Recent Labs    11/24/24 1156  NA 130*  K 4.3  CL 93*  CO2 25  GLUCOSE 232*  BUN 39*  CREATININE 6.73*  CALCIUM 8.3*    Intake/Output Summary (Last 24 hours) at 11/27/2024 0940 Last data filed at 11/26/2024 2000 Gross per 24 hour  Intake --  Output 2450 ml  Net -2450 ml        Physical Exam: Vital Signs Blood pressure 132/67, pulse 61, temperature 98.6 F (37 C), resp. rate 18, height 6' 2 (1.88 m), weight 121.4 kg, SpO2 99%. Constitutional: No apparent distress. Appropriate appearance for age.  Sitting up in wheelchair. HENT: No JVD. Neck Supple. Trachea midline. Atraumatic, normocephalic. Eyes: PERRLA. EOMI. Visual fields grossly intact.  No notable nystagmus with EOMI.  Cardiovascular: RRR, no murmurs/rub/gallops.1+ BL LE Edema. Peripheral pulses 1+  Respiratory: CTAB. No rales, rhonchi, or wheezing. On RA.  Abdomen: + bowel sounds, normoactive. No distention or tenderness.  Skin:  Psoriasis, flaky skin bilateral palms  and feet. --Improved Callus under right great toe.  Tunneled dialysis cath left internal jugular.  Perineal wound packed--unable to examine today due to positioning/clothing L 1st toe wound - covered      MSK:  No apparent deformity.  + TTP right greater than left cervical paraspinals and occiput--approved today 1-9  Neurologic exam:  Alert and oriented x 3.    Able to follow all commands with improved reaction time and comprehension. Mild memory deficits Good insight and awareness Normal speech and language. Cranial nerves II through XII intact 4-5 strength distal and proximal bilateral upper and lower extremities. Peripheral neuropathy in bilateral feet distal to the ankle worse on the left No tone Reflexes intact No ataxia  Unchanged 1-9  Assessment/Plan: 1. Functional deficits which require 3+ hours per day of interdisciplinary therapy in a comprehensive inpatient rehab setting. Physiatrist is providing close team supervision and 24 hour management of active medical problems listed below. Physiatrist and rehab team continue to assess barriers to discharge/monitor patient progress toward functional and medical goals  Care Tool:  Bathing    Body parts bathed by patient: Right arm, Left arm, Chest, Abdomen, Front perineal area, Right upper leg, Left upper leg, Face   Body parts bathed by helper: Left lower leg, Right lower leg,  Buttocks     Bathing assist Assist Level: Moderate Assistance - Patient 50 - 74%     Upper Body Dressing/Undressing Upper body dressing   What is the patient wearing?: Pull over shirt    Upper body assist Assist Level: Set up assist    Lower Body Dressing/Undressing Lower body dressing      What is the patient wearing?: Pants     Lower body assist Assist for lower body dressing: Maximal Assistance - Patient 25 - 49%     Toileting Toileting    Toileting assist Assist for toileting: Moderate Assistance - Patient 50 - 74%      Transfers Chair/bed transfer  Transfers assist     Chair/bed transfer assist level: Contact Guard/Touching assist     Locomotion Ambulation   Ambulation assist      Assist level: Minimal Assistance - Patient > 75% Assistive device: Walker-rolling Max distance: 24'   Walk 10 feet activity   Assist     Assist level: Minimal Assistance - Patient > 75% Assistive device: Walker-rolling   Walk 50 feet activity   Assist Walk 50 feet with 2 turns activity did not occur: Safety/medical concerns         Walk 150 feet activity   Assist Walk 150 feet activity did not occur: Safety/medical concerns         Walk 10 feet on uneven surface  activity   Assist Walk 10 feet on uneven surfaces activity did not occur: Safety/medical concerns         Wheelchair     Assist Is the patient using a wheelchair?: Yes Type of Wheelchair: Manual    Wheelchair assist level: Dependent - Patient 0%      Wheelchair 50 feet with 2 turns activity    Assist        Assist Level: Dependent - Patient 0%   Wheelchair 150 feet activity     Assist      Assist Level: Dependent - Patient 0%   Blood pressure 132/67, pulse 61, temperature 98.6 F (37 C), resp. rate 18, height 6' 2 (1.88 m), weight 121.4 kg, SpO2 99%.   Medical Problem List and Plan: 1. Functional deficits secondary to debility due subacute TBI with SDH s/p fall             -patient may  shower             -ELOS/Goals: 10-12 days, supervision to mod I goals  -Stable to continue CIR  2.  Antithrombotics: -DVT/anticoagulation:  Pharmaceutical: Heparin              -antiplatelet therapy: N/A  3. Pain Management: Tylenol  prn.   1-8: Tension headache; add Voltaren  gel 4 times daily and as needed lidocaine  patches to posterior neck--symptoms improved 1-9  4. Mood/Behavior/Sleep: LCSW to follow for evaluation and support.              -antipsychotic agents: N/A             -pt struggling to  sleep d/t visits by staff. Try to limit HS interruptions while on rehab. He denies that pain or his CPAP machine are contributing to problem.              -check sleep chart     1/6: Add as needed melatonin 5 mg at bedtime   - Sleeping well  5. Neuropsych/cognition: This patient may be capable of making decisions on his own behalf.   - 1/6:  Cog eval limiting - patient with limited participation and dizziness - SLP not noting true cog deficits so not picking up for ongoing therapies  1-9: Seems more awake, alert the past few days.  6. Skin/Wound Care: Routine pressure relief measures             -eucerin cream to bilateral hands and feet bid, pt states he's been dealing with it for several months. psoriasis-- follows with Dr. Tarfeen (dermatology) for the past 6 years    - R toe abrasion   - Wound care in place for scrotum/peroneal wound  7. Fluids/Electrolytes/Nutrition: Strict I/O. Labs with HD   - AM labs with stable hyponatremia; monitor w/ dialysis  8. MSSA/ Proteus mirabilis bacteremia/monocytosis: cefazolin  (changed from Cefepime  on 1/3 by ID) with HD till 1/9 to complete 2 weeks following line removal   - Levaquin  300 mg Q48H added on 01/04 for additional week  - 1/8: Per Dr Overton, patient with persistent monocytosis, would not change regimen unless s/s infection -   need hematology outpatient f/u on discharge -1/9 repeat labs ordered with dialysis on Saturday since they were not performed on Thursday  9.  T2DM: Hgb A1C- 8.4%. Monitor BS ac/hs and use SSI for elevated BS             --continue Lantus  increased to 25 units on 01/05 and on 5 units TID for meal coverage.              -glucose control is gradually improving  1/6: Monitor BG with increased activity  1/7: Eating 25 to 80% of meals.  Cautious to overdo meal coverage given variability.  Increase Lantus  to 30 units daily.  1-8: Blood sugars looking better, monitor today  1-9: Increase Semglee  to 35 units; if further  increases, would split to twice daily  Recent Labs    11/26/24 1740 11/26/24 2034 11/27/24 0641  GLUCAP 152* 243* 187*      10. HTN: BP has been on low side likely due to sepsis.  --On coreg  6.25 mg bid. Continue to hold Norvasc .  -1-6 stable--monitor closely and get orthostatics in a.m. 1/7: Mildly hypertensive this a.m.  Variability with dialysis, will add as needed hydralazine  for SBP greater than 170 and monitor 1/8: resume norvasc  at low dose 2.5 mg daily. Continue to hold torsemide  - Monitor 1 to 2 days for response    11/27/2024    6:41 AM 11/27/2024    6:39 AM 11/26/2024    8:34 PM  Vitals with BMI  Weight 267 lbs 10 oz    BMI 34.35    Systolic  132 145  Diastolic  67 87  Pulse  61 65    11. Fournier's gangrene s/p I & D 08/2024: Continue to pack wound bid.   12. Anemia of chronic disease: On weekly aranesp . Continue to monitor H/H. - Labs with dialysis  13.  OSA: Continue CPAP  14. Constipation: On Miralax  and senna but refusing second dose-->will simplify regimen.   - LBM 1/5, large  15. ESRD. Dialysis T TH Sat.   - Makes small amount of urine, incontinent at baseline  16. Vertigo/Dizziness -likely central vertigo but may be complicated by orthostasis -- Reports occipital HA, blurry vision and dizziness once up-vestibular eval pending - 1/6 Add PRN meclizine  12.5 mg TID, scopolamine  patch - 1-8: Worsening vertigo today, but appears scopolamine  patch fell off; ordered replacement--symptoms improved 1-9  LOS: 4 days A FACE TO FACE EVALUATION WAS PERFORMED  Brendan [name] Likes 11/27/2024, 9:40 AM     "

## 2024-11-27 NOTE — Plan of Care (Signed)
" °  Problem: Consults Goal: RH GENERAL PATIENT EDUCATION Description: See Patient Education module for education specifics. Outcome: Progressing   Problem: RH SKIN INTEGRITY Goal: RH STG SKIN FREE OF INFECTION/BREAKDOWN Description: Manage skin free of infections with mod I - supervision assistance  Outcome: Progressing   Problem: RH SAFETY Goal: RH STG ADHERE TO SAFETY PRECAUTIONS W/ASSISTANCE/DEVICE Description: STG Adhere to Safety Precautions With mod I supervision Assistance/Device. Outcome: Progressing   "

## 2024-11-27 NOTE — Progress Notes (Signed)
 Occupational Therapy Session Note  Patient Details  Name: Brendan [name]. MRN: [id number] Date of Birth: June 16, 1956  Today's Date: 11/27/2024 OT Individual Time: 0940-1005 OT Individual Time Calculation (min): 25 min    Short Term Goals: Week 1:  OT Short Term Goal 1 (Week 1): Patient to perform grooming sitting at the sink with Set up OT Short Term Goal 2 (Week 1): Patient to perform LE dressing with AE and Min assist OT Short Term Goal 3 (Week 1): Patient to perform toileting with CGA OT Short Term Goal 4 (Week 1): Patient to perform shower transfer with SBA. Week 2:     Skilled Therapeutic Interventions/Progress Updates:    1:1 Pt reports pain along his right flank - applied heat to his side to help with relief and pt appreciative. Continued to assess and look at vision and vestibular. Pt reports that in the central upper region object is slightly more clear (that constant blurry). PT able to track in all fields smoothly however, Pt with difficulty with VOR. Eyes not able to stabilize on fixed large letter on card and often wander. Pt does report that the dizziness doesn't worsen with this activity just hard. Provided large letter on square paper and recommendation for performing exercises 3x a day.  PT left sitting up in w/c at end of session.    Therapy Documentation Precautions:  Precautions Precautions: Fall Recall of Precautions/Restrictions: Intact Precaution/Restrictions Comments: watch BP, HD fluid restriction Restrictions Weight Bearing Restrictions Per Provider Order: No Other Position/Activity Restrictions: increased dizziness with transitions supine-sit-stand; not orthostatic on eval. General:   Vital Signs: Therapy Vitals Temp: 98.5 F (36.9 C) Temp Source: Oral Pulse Rate: 63 Resp: 18 BP: 108/66 Patient Position (if appropriate): Sitting Oxygen Therapy SpO2: 100 % O2 Device: Room Air Pain: Right flank pain - applied heat to side and allowed for rest.     Therapy/Group: Individual Therapy  Claudene Nest [facility] 11/27/2024, 2:01 PM

## 2024-11-27 NOTE — Progress Notes (Addendum)
 " Inpatient Rehabilitation Care Coordinator Assessment and Plan Patient Details  Name: Brendan [name]. MRN: [id number] Date of Birth: [birth year]  Today's Date: 11/27/2024  Hospital Problems: Principal Problem:   TBI (traumatic brain injury) [facility]) Active Problems:   Debility  Past Medical History:  Past Medical History:  Diagnosis Date   Acanthosis nigricans    Atopic dermatitis    CHF (congestive heart failure) (HCC)    CKD (chronic kidney disease) stage 4, GFR 15-29 ml/min (HCC) 01/08/2023   Diabetes (HCC) 11/19/2002   Erectile dysfunction    Fatty liver 11/19/2005   Glaucoma 11/19/2006   Gynecomastia 11/20/2007   Hx of adenomatous colonic polyps 08/26/2023   Hyperaldosteronism 11/19/1998   Hypercholesterolemia    Hyperlipidemia 2010   Hypertension 1999   Hypoglycemic reaction    Hypothyroidism 11/19/2002   Incontinence    Intermittent vertigo 11/19/2010   Left cervical radiculopathy 11/19/2010   Lumbar radiculopathy    Obesity    Peripheral neuropathy    Pollen allergies 11/19/2005   perennial   Prostate cancer (HCC) 11/19/2004   Reflux esophagitis 11/19/1993   Sickle cell trait 11/19/2004   Sleep apnea, obstructive 11/19/1998   uses a cpap   Venous insufficiency 11/20/2003   Vitamin D deficiency 11/19/2010   Past Surgical History:  Past Surgical History:  Procedure Laterality Date   COLONOSCOPY  2006   normal   INCISION AND DRAINAGE OF WOUND N/A 08/28/2024   Procedure: IRRIGATION AND EXCISIONAL DEBRIDEMENT WOUND;  Surgeon: Lyndel Deward PARAS, MD;  Location: [facility] OR;  Service: General;  Laterality: N/A;  EXCISIONAL DEBRIDEMENT   INCISION AND DRAINAGE PERIRECTAL ABSCESS N/A 08/30/2024   Procedure: INCISION AND DRAINAGE OF PERINEAL WOUND;  Surgeon: Polly Cordella LABOR, MD;  Location: [facility] OR;  Service: General;  Laterality: N/A;   IR REMOVAL TUN CV CATH W/O FL  11/13/2024   IR TUNNELED CENTRAL VENOUS CATH PLC W IMG  09/18/2024   IR TUNNELED CENTRAL VENOUS CATH PLC  W IMG  09/22/2024   IR TUNNELED CENTRAL VENOUS CATH PLC W IMG  11/17/2024   lap band surgery  2009   left inguinal hernia repair  1998   LIPOMA EXCISION Left 04/17/2023   Procedure: MINOR EXCISION LEFT BUTTOCK SEBACEOUS CYST;  Surgeon: Lyndel Deward PARAS, MD;  Location: [facility];  Service: General;  Laterality: Left;   robotic prostatectomy  2008   TRANSESOPHAGEAL ECHOCARDIOGRAM (CATH LAB) N/A 11/18/2024   Procedure: TRANSESOPHAGEAL ECHOCARDIOGRAM;  Surgeon: Santo Stanly LABOR, MD;  Location: [facility] INVASIVE CV LAB;  Service: Cardiovascular;  Laterality: N/A;   Social History:  reports that he has never smoked. He has never used smokeless tobacco. He reports current alcohol use. He reports that he does not use drugs.  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 Education: some college Health Literacy - How often do you need to have someone help you when you read instructions, pamphlets, or other written material from your doctor or pharmacy?: Rarely Writes: Yes Employment Status: Retired Date Retired/Disabled/Unemployed: 2008  Legal History/Current Legal Issues: Dneies Guardian/Conservator: HCPOA- wife Brendan   Abuse/Neglect Abuse/Neglect Assessment Can Be Completed: Yes Physical Abuse: Denies Verbal Abuse: Denies Sexual Abuse: Denies Exploitation of patient/patient's resources:  Denies Self-Neglect: Denies  Patient response to: Social Isolation - How often do you feel lonely or isolated from those around you?: Never  Emotional Status Pt's affect, behavior and adjustment status: Pt in good spirirts at time of visit. Recent Psychosocial Issues: Denies Psychiatric History: Denies Substance Abuse History: Pt states he stopped drinking but only drank on occassion. Denies rec drug use or tobacco products.  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  Community Resources [employer]: None Premorbid Home Care/DME Agencies: None Transportation available at discharge: Wife Is the patient able to respond to transportation needs?: Yes In the past 12 months, has lack of transportation kept you from medical appointments or from getting medications?: No In the past 12 months, has lack of transportation kept you from meetings, work, or from getting things needed for daily living?: No Resource referrals recommended: Neuropsychology  Discharge Planning Living Arrangements: Spouse/significant other Support Systems: Spouse/significant other Type of Residence: Private residence Insurance Resources: Media [employer] (specify) (BCBS Medicare) Financial Resources: Restaurant Manager, Fast Food Screen Referred: No Living Expenses: Mortgage Money Management: Patient Does the patient have any problems obtaining your medications?: No Home Management: Pt reports he prepared meals and wife did home cleaning. Patient/Family Preliminary Plans: Wife Care Coordinator Barriers to Discharge: Decreased caregiver support, Lack  of/limited family support, Insurance for SNF coverage Care Coordinator Anticipated Follow Up Needs: HH/OP Expected length of stay: ELOS 7-12 days  Clinical Impression SW met with pt in room at bedside.He is not a cytogeneticist. DME- CPAP machine, RW, canes, and BSC.   Tallis Soledad A Rexanna Louthan 11/27/2024, 1:08 PM    "

## 2024-11-28 DIAGNOSIS — H8110 Benign paroxysmal vertigo, unspecified ear: Secondary | ICD-10-CM

## 2024-11-28 DIAGNOSIS — S069X9D Unspecified intracranial injury with loss of consciousness of unspecified duration, subsequent encounter: Secondary | ICD-10-CM

## 2024-11-28 DIAGNOSIS — D638 Anemia in other chronic diseases classified elsewhere: Secondary | ICD-10-CM | POA: Diagnosis not present

## 2024-11-28 DIAGNOSIS — G4733 Obstructive sleep apnea (adult) (pediatric): Secondary | ICD-10-CM

## 2024-11-28 DIAGNOSIS — E119 Type 2 diabetes mellitus without complications: Secondary | ICD-10-CM | POA: Diagnosis not present

## 2024-11-28 DIAGNOSIS — Z794 Long term (current) use of insulin: Secondary | ICD-10-CM | POA: Diagnosis not present

## 2024-11-28 LAB — GLUCOSE, CAPILLARY
Glucose-Capillary: 189 mg/dL — ABNORMAL HIGH (ref 70–99)
Glucose-Capillary: 221 mg/dL — ABNORMAL HIGH (ref 70–99)
Glucose-Capillary: 133 mg/dL — ABNORMAL HIGH (ref 70–99)
Glucose-Capillary: 140 mg/dL — ABNORMAL HIGH (ref 70–99)

## 2024-11-28 LAB — CBC WITH DIFFERENTIAL/PLATELET
Abs Immature Granulocytes: 0.12 K/uL — ABNORMAL HIGH (ref 0.00–0.07)
Basophils Absolute: 0.1 K/uL (ref 0.0–0.1)
Basophils Relative: 1 %
Eosinophils Absolute: 0.7 K/uL — ABNORMAL HIGH (ref 0.0–0.5)
Eosinophils Relative: 5 %
HCT: 23.8 % — ABNORMAL LOW (ref 39.0–52.0)
Hemoglobin: 7.9 g/dL — ABNORMAL LOW (ref 13.0–17.0)
Immature Granulocytes: 1 %
Lymphocytes Relative: 11 %
Lymphs Abs: 1.5 K/uL (ref 0.7–4.0)
MCH: 27.2 pg (ref 26.0–34.0)
MCHC: 33.2 g/dL (ref 30.0–36.0)
MCV: 82.1 fL (ref 80.0–100.0)
Monocytes Absolute: 1.5 K/uL — ABNORMAL HIGH (ref 0.1–1.0)
Monocytes Relative: 12 %
Neutro Abs: 9.2 K/uL — ABNORMAL HIGH (ref 1.7–7.7)
Neutrophils Relative %: 70 %
Platelets: 308 K/uL (ref 150–400)
RBC: 2.9 MIL/uL — ABNORMAL LOW (ref 4.22–5.81)
RDW: 17.1 % — ABNORMAL HIGH (ref 11.5–15.5)
WBC: 13.1 K/uL — ABNORMAL HIGH (ref 4.0–10.5)
nRBC: 0 % (ref 0.0–0.2)

## 2024-11-28 LAB — BASIC METABOLIC PANEL WITH GFR
Anion gap: 10 (ref 5–15)
BUN: 32 mg/dL — ABNORMAL HIGH (ref 8–23)
CO2: 28 mmol/L (ref 22–32)
Calcium: 8.4 mg/dL — ABNORMAL LOW (ref 8.9–10.3)
Chloride: 93 mmol/L — ABNORMAL LOW (ref 98–111)
Creatinine, Ser: 5.96 mg/dL — ABNORMAL HIGH (ref 0.61–1.24)
GFR, Estimated: 10 mL/min — ABNORMAL LOW
Glucose, Bld: 194 mg/dL — ABNORMAL HIGH (ref 70–99)
Potassium: 4.3 mmol/L (ref 3.5–5.1)
Sodium: 130 mmol/L — ABNORMAL LOW (ref 135–145)

## 2024-11-28 MED ORDER — HEPARIN SODIUM (PORCINE) 1000 UNIT/ML IJ SOLN
INTRAMUSCULAR | Status: AC
  Filled 2024-11-28: qty 5

## 2024-11-28 NOTE — Plan of Care (Signed)
" °  Problem: Consults Goal: RH GENERAL PATIENT EDUCATION Description: See Patient Education module for education specifics. Outcome: Progressing   Problem: RH BOWEL ELIMINATION Goal: RH STG MANAGE BOWEL WITH ASSISTANCE Description: STG Manage Bowel with mod I aupervision Assistance. Outcome: Progressing   Problem: RH SKIN INTEGRITY Goal: RH STG SKIN FREE OF INFECTION/BREAKDOWN Description: Manage skin free of infections with mod I - supervision assistance  Outcome: Progressing   "

## 2024-11-28 NOTE — Progress Notes (Signed)
 "  [facility] Progress Note   Subjective:   Patient seen and examined at bedside.  Reports sleeping well overnight.  Tolerating dialysis ok.  Denies chest pain, SOB, abdominal pain, n/v/d, weakness, dizziness and fatigue.  No specific complaints.  Objective Vitals:   11/27/24 1250 11/27/24 1957 11/28/24 0415 11/28/24 0854  BP: 108/66 (!) 162/77 (!) 146/68 (!) 146/68  Pulse: 63 66 61   Resp: 18 18 17    Temp: 98.5 F (36.9 C) 98.6 F (37 C) 97.8 F (36.6 C)   TempSrc: Oral Oral Oral   SpO2: 100% 100% 99%   Weight:   125 kg   Height:       Physical Exam General:well appearing male, laying in bed Heart:RRR Lungs:CTAB anteriolaterally, nml WOB on RA Abdomen:soft, NTND Extremities:1+ LE edema Dialysis Access: TDC in L chest   Filed Weights   11/26/24 1645 11/27/24 0641 11/28/24 0415  Weight: 121.4 kg 121.4 kg 125 kg    Intake/Output Summary (Last 24 hours) at 11/28/2024 1049 Last data filed at 11/28/2024 0857 Gross per 24 hour  Intake 920 ml  Output 100 ml  Net 820 ml    Additional Objective Labs: Basic Metabolic Panel: Recent Labs  Lab 11/23/24 0431 11/23/24 1549 11/24/24 1156 11/28/24 0601  NA 130* 131* 130* 130*  K 4.0 3.9 4.3 4.3  CL 94* 94* 93* 93*  CO2 25 27 25 28   GLUCOSE 175* 179* 232* 194*  BUN 29* 32* 39* 32*  CREATININE 5.20* 5.91* 6.73* 5.96*  CALCIUM 8.2* 8.2* 8.3* 8.4*  PHOS 4.4 4.7* 4.9*  --    Liver Function Tests: Recent Labs  Lab 11/22/24 0600 11/22/24 0601 11/23/24 0431 11/23/24 1549 11/24/24 1156  AST 12*  --   --   --   --   ALT <5  --   --   --   --   ALKPHOS 101  --   --   --   --   BILITOT 0.6  --   --   --   --   PROT 6.2*  --   --   --   --   ALBUMIN  2.4*   < > 2.2* 2.4* 2.5*   < > = values in this interval not displayed.   CBC: Recent Labs  Lab 11/22/24 0600 11/23/24 0431 11/23/24 1549 11/24/24 1156 11/28/24 0601  WBC 24.1* 21.8* 21.5* 23.7* 13.1*  NEUTROABS 19.3* 19.0*  --  20.7* 9.2*  HGB 8.4*  8.1* 8.3* 8.8* 7.9*  HCT 25.0* 24.5* 25.1* 26.3* 23.8*  MCV 82.8 83.6 83.7 82.7 82.1  PLT 241 220 233 226 308   CBG: Recent Labs  Lab 11/27/24 0641 11/27/24 1215 11/27/24 1647 11/27/24 1953 11/28/24 0527  GLUCAP 187* 173* 304* 343* 189*    Medications:   ceFAZolin  (ANCEF ) IV 2 g (11/26/24 1906)    amLODipine   2.5 mg Oral Daily   calcitRIOL   0.25 mcg Oral Daily   carvedilol   6.25 mg Oral BID WC   Chlorhexidine  Gluconate Cloth  6 each Topical BID   Chlorhexidine  Gluconate Cloth  6 each Topical Q0600   darbepoetin (ARANESP ) injection - DIALYSIS  100 mcg Subcutaneous Q Tue-1800   diclofenac  Sodium  2 g Topical QID   dorzolamide   1 drop Both Eyes Daily   heparin   5,000 Units Subcutaneous Q8H   hydrocerin   Topical BID   insulin  aspart  0-5 Units Subcutaneous QHS   insulin  aspart  0-6 Units Subcutaneous TID WC  insulin  aspart  5 Units Subcutaneous TID WC   insulin  glargine  35 Units Subcutaneous Daily   levothyroxine   125 mcg Oral q AM   scopolamine   1 patch Transdermal Q72H   senna-docusate  1 tablet Oral BID   simvastatin   20 mg Oral QPM   timolol   1 drop Both Eyes Daily    Dialysis Orders: # ESRD MTuThF (note that AKI has progressed to ESRD) EDW 125kg - he has been at [facility] - S/p line holiday 12/26 to 12/30 - HD per TTS schedule here - next HD tomorrow  Assessment/Plan: 1. Sepsis 2/2 MSSA and proteus bacteremia - suspected TDC infection s/p line holiday.  Replaced on 11/17/24. WBC finally trending down. Per ID needs IV cefazolin  w/ HD x 4 weeks thru 12/11/2024.  To complete Levaquin  course during admission on 11/29/24.    2. ESRD - previously considered AKI, now deemed ESRD.  HD on TTS schedule while admitted. 3. Anemia of CKD- Hgb 7.9. On ESA - aranesp  100mcg qSat.  No iron  with recent infection/IV antibiotics.  4. Secondary hyperparathyroidism - Ca and phos in goal. Not on binders. Continue VDRA.  5. HTN/volume - Blood pressure elevated today.  Continue coreg   6.25mg  BID and amlodipine  2.5mg  at bedtime.  LE edema on exam.  UF as tolerated. Will need lower EDW on d/c.   6. Nutrition - Renal diet w/fluid restrictions.  7. Recent Traumatic brain injury 2/2 to fall - in CIR.    Manuelita Labella, PA-C [facility] 11/28/2024,10:49 AM  LOS: 5 days    "

## 2024-11-28 NOTE — Progress Notes (Signed)
" °   11/28/24 1639  Vitals  Temp 98.2 F (36.8 C)  BP (!) 141/75  Pulse Rate 61  Resp 13  Weight 123.8 kg  Oxygen Therapy  SpO2 100 %  O2 Device Room Air  Patient Activity (if Appropriate) In bed  Pulse Oximetry Type Continuous  Oximetry Probe Site Changed No  During Treatment Monitoring  Blood Flow Rate (mL/min) 0 mL/min  Arterial Pressure (mmHg) -382.2 mmHg  Venous Pressure (mmHg) 180.6 mmHg  TMP (mmHg) 3.03 mmHg  Ultrafiltration Rate (mL/min) 1040 mL/min  Dialysate Flow Rate (mL/min) 299 ml/min  Dialysate Potassium Concentration 3  Dialysate Calcium Concentration 2.5  Duration of HD Treatment -hour(s) 3.5 hour(s)  Cumulative Fluid Removed (mL) per Treatment  2999.78  HD Safety Checks Performed Yes  Intra-Hemodialysis Comments Tolerated well;Tx completed    "

## 2024-11-28 NOTE — Plan of Care (Signed)
" °  Problem: Consults Goal: RH GENERAL PATIENT EDUCATION Description: See Patient Education module for education specifics. Outcome: Progressing   Problem: RH BOWEL ELIMINATION Goal: RH STG MANAGE BOWEL WITH ASSISTANCE Description: STG Manage Bowel with mod I aupervision Assistance. Outcome: Progressing   Problem: RH SKIN INTEGRITY Goal: RH STG SKIN FREE OF INFECTION/BREAKDOWN Description: Manage skin free of infections with mod I - supervision assistance  Outcome: Progressing   Problem: RH SAFETY Goal: RH STG ADHERE TO SAFETY PRECAUTIONS W/ASSISTANCE/DEVICE Description: STG Adhere to Safety Precautions With mod I supervision Assistance/Device. Outcome: Progressing   Problem: RH PAIN MANAGEMENT Goal: RH STG PAIN MANAGED AT OR BELOW PT'S PAIN GOAL Description: <4 w/ prns Outcome: Progressing   Problem: RH KNOWLEDGE DEFICIT GENERAL Goal: RH STG INCREASE KNOWLEDGE OF SELF CARE AFTER HOSPITALIZATION Description: Manage increase knowledge of self care after hospitalization with mod I- supervision assistance from wife using educational materials provided Outcome: Progressing   "

## 2024-11-28 NOTE — Progress Notes (Signed)
 Occupational Therapy Session Note  Patient Details  Name: Brendan [name]. MRN: [id number] Date of Birth: 1956/06/12  Today's Date: 11/28/2024 OT Individual Time: 0915-1010 OT Individual Time Calculation (min): 55 min    Short Term Goals: Week 1:  OT Short Term Goal 1 (Week 1): Patient to perform grooming sitting at the sink with Set up OT Short Term Goal 2 (Week 1): Patient to perform LE dressing with AE and Min assist OT Short Term Goal 3 (Week 1): Patient to perform toileting with CGA OT Short Term Goal 4 (Week 1): Patient to perform shower transfer with SBA.  Skilled Therapeutic Interventions/Progress Updates:  Patient agreeable to participate in OT session. Reports 8/10 pain level in neck/ head and medicated by RN.   Patient participated in skilled OT session focusing on sit to stand, BP management, grooming. Patient received in bed in modified chair position. Patient completed bed mobility SUP to CGA. Patient then requested medication. Patient then completed LE ADL training with threading pants CG to min A without use of reacher and able to don/ doff CGA to min A. Patient requested tips for sock donning due to poor ability to reach feet. Able to don socks with sock aide min to mod A due to decreased grip strength. Patient then completed sit to stand x5 with use of rails/ RW to increase functional mobility. Transitioned to grooming sitting at sink, standing with RW. Patient then returned back to bed, requested nursing assistance with wound care. Patient left in bed alarm on all requested needs in reach.     Therapy Documentation Precautions:  Precautions Precautions: Fall Recall of Precautions/Restrictions: Intact Precaution/Restrictions Comments: watch BP, HD fluid restriction Restrictions Weight Bearing Restrictions Per Provider Order: No Other Position/Activity Restrictions: increased dizziness with transitions supine-sit-stand; not orthostatic on eval.  Therapy/Group:  Individual Therapy  D'mariea L Urijah Raynor 11/28/2024, 8:34 AM

## 2024-11-28 NOTE — Progress Notes (Signed)
 "                                                        PROGRESS NOTE   Subjective/Complaints:  Still has dizziness. Sleeping better on rehab. Feels that he's making progress  ROS: Patient denies fever, rash, sore throat, blurred vision,    vomiting, diarrhea, cough, shortness of breath or chest pain, joint or back/neck pain, headache, or mood change.    Objective:   No results found. Recent Labs    11/28/24 0601  WBC 13.1*  HGB 7.9*  HCT 23.8*  PLT 308   Recent Labs    11/28/24 0601  NA 130*  K 4.3  CL 93*  CO2 28  GLUCOSE 194*  BUN 32*  CREATININE 5.96*  CALCIUM 8.4*    Intake/Output Summary (Last 24 hours) at 11/28/2024 9177 Last data filed at 11/28/2024 0700 Gross per 24 hour  Intake 800 ml  Output 100 ml  Net 700 ml        Physical Exam: Vital Signs Blood pressure (!) 146/68, pulse 61, temperature 97.8 F (36.6 C), temperature source Oral, resp. rate 17, height 6' 2 (1.88 m), weight 125 kg, SpO2 99%. Constitutional: No distress . Vital signs reviewed. Using CPAP HEENT: NCAT, EOMI, oral membranes moist Neck: supple Cardiovascular: RRR without murmur. No JVD    Respiratory/Chest: CTA Bilaterally without wheezes or rales. Normal effort    GI/Abdomen: BS +, non-tender, non-distended Ext: no clubbing, cyanosis, or edema Psych: pleasant and cooperative  Skin:  Psoriasis, flaky skin bilateral palms and feet. --Improved Callus under right great toe.  Tunneled dialysis cath left internal jugular.  Perineal wound packed--  L 1st toe wound - covered      MSK:  No apparent deformity.  + TTP right greater than left cervical paraspinals and occiput--approved today 1-9  Neurologic exam:  Alert and oriented x 3.    Reasonable insight and awareness.. Mild memory deficits Normal speech and language. Cranial nerves II through XII intact 4-5 strength distal and proximal bilateral upper and lower extremities. Peripheral neuropathy in bilateral feet distal  to the ankle worse on the left No tone Reflexes intact No ataxia Experienced vertigo when tracking me while supine in bed. Had to close eyes for sx to subside.   Assessment/Plan: 1. Functional deficits which require 3+ hours per day of interdisciplinary therapy in a comprehensive inpatient rehab setting. Physiatrist is providing close team supervision and 24 hour management of active medical problems listed below. Physiatrist and rehab team continue to assess barriers to discharge/monitor patient progress toward functional and medical goals  Care Tool:  Bathing    Body parts bathed by patient: Right arm, Left arm, Chest, Abdomen, Front perineal area, Right upper leg, Left upper leg, Face   Body parts bathed by helper: Left lower leg, Right lower leg, Buttocks     Bathing assist Assist Level: Moderate Assistance - Patient 50 - 74%     Upper Body Dressing/Undressing Upper body dressing   What is the patient wearing?: Pull over shirt    Upper body assist Assist Level: Set up assist    Lower Body Dressing/Undressing Lower body dressing      What is the patient wearing?: Pants     Lower body assist Assist for lower body dressing: Maximal Assistance -  Patient 25 - 49%     Toileting Toileting    Toileting assist Assist for toileting: Moderate Assistance - Patient 50 - 74%     Transfers Chair/bed transfer  Transfers assist     Chair/bed transfer assist level: Contact Guard/Touching assist     Locomotion Ambulation   Ambulation assist      Assist level: Minimal Assistance - Patient > 75% Assistive device: Walker-rolling Max distance: 24'   Walk 10 feet activity   Assist     Assist level: Minimal Assistance - Patient > 75% Assistive device: Walker-rolling   Walk 50 feet activity   Assist Walk 50 feet with 2 turns activity did not occur: Safety/medical concerns         Walk 150 feet activity   Assist Walk 150 feet activity did not occur:  Safety/medical concerns         Walk 10 feet on uneven surface  activity   Assist Walk 10 feet on uneven surfaces activity did not occur: Safety/medical concerns         Wheelchair     Assist Is the patient using a wheelchair?: Yes Type of Wheelchair: Manual    Wheelchair assist level: Dependent - Patient 0%      Wheelchair 50 feet with 2 turns activity    Assist        Assist Level: Dependent - Patient 0%   Wheelchair 150 feet activity     Assist      Assist Level: Dependent - Patient 0%   Blood pressure (!) 146/68, pulse 61, temperature 97.8 F (36.6 C), temperature source Oral, resp. rate 17, height 6' 2 (1.88 m), weight 125 kg, SpO2 99%.   Medical Problem List and Plan: 1. Functional deficits secondary to debility due subacute TBI with SDH s/p fall             -patient may  shower             -ELOS/Goals: 10-12 days, supervision to mod I goals  -Continue CIR therapies including PT, OT, and SLP   2.  Antithrombotics: -DVT/anticoagulation:  Pharmaceutical: Heparin              -antiplatelet therapy: N/A  3. Pain Management: Tylenol  prn.   1-8: Tension headache; add Voltaren  gel 4 times daily and as needed lidocaine  patches to posterior neck--symptoms improved 1-9  4. Mood/Behavior/Sleep: LCSW to follow for evaluation and support.              -antipsychotic agents: N/A             -pt struggling to sleep d/t visits by staff. Try to limit HS interruptions while on rehab. He denies that pain or his CPAP machine are contributing to problem.              -check sleep chart     1/6: Add as needed melatonin 5 mg at bedtime   -1/10. Sleeping better using CPAP  5. Neuropsych/cognition: This patient may be capable of making decisions on his own behalf.   - 1/6: Cog eval limiting - patient with limited participation and dizziness - SLP not noting true cog deficits so not picking up for ongoing therapies  1-9,10--sleep improving which helps his  arousal  6. Skin/Wound Care: Routine pressure relief measures             -eucerin cream to bilateral hands and feet bid, pt states he's been dealing with it for several months. psoriasis-- follows  with Dr. Tarfeen (dermatology) for the past 6 years    - R toe abrasion   - Wound care in place for scrotum/peroneal wound  7. Fluids/Electrolytes/Nutrition: Strict I/O. Labs with HD   - AM labs with stable hyponatremia; monitor w/ dialysis  8. MSSA/ Proteus mirabilis bacteremia/monocytosis: cefazolin  (changed from Cefepime  on 1/3 by ID) with HD till 1/9 to complete 2 weeks following line removal   - Levaquin  300 mg Q48H added on 01/04 for additional week  - 1/8: Per Dr Overton, patient with persistent monocytosis, would not change regimen unless s/s infection -   need hematology outpatient f/u on discharge -1/10 wbc's much improved today 13k  9.  T2DM: Hgb A1C- 8.4%. Monitor BS ac/hs and use SSI for elevated BS             --continue Lantus  increased to 25 units on 01/05 and on 5 units TID for meal coverage.              1/7: Eating 25 to 80% of meals.  Cautious to overdo meal coverage given variability.  Increase Lantus  to 30 units daily.  1-8: Blood sugars looking better, monitor today  1-9: Increase Semglee  to 35 units; if further increases, would split to twice daily  1/10 observe cbg's today as just received the 35u dose   -sugars remain poorly controlled Recent Labs    11/27/24 1647 11/27/24 1953 11/28/24 0527  GLUCAP 304* 343* 189*      10. HTN: BP has been on low side likely due to sepsis.  --On coreg  6.25 mg bid. Continue to hold Norvasc .  -1-6 stable--monitor closely and get orthostatics in a.m. 1/7: Mildly hypertensive this a.m.  Variability with dialysis, will add as needed hydralazine  for SBP greater than 170 and monitor 1/8: resume norvasc  at low dose 2.5 mg daily. Continue to hold torsemide  - 1/10 fair to borderline control. No changes    11/28/2024    4:15 AM 11/27/2024     7:57 PM 11/27/2024   12:50 PM  Vitals with BMI  Weight 275 lbs 9 oz    BMI 35.37    Systolic 146 162 891  Diastolic 68 77 66  Pulse 61 66 63    11. Fournier's gangrene s/p I & D 08/2024: Continue to pack wound bid.   12. Anemia of chronic disease: On weekly aranesp . Continue to monitor H/H. - Labs with dialysis  13.  OSA: Continue CPAP  14. Constipation: On Miralax  and senna but refusing second dose-->will simplify regimen.   - LBM 1/5, large  15. ESRD. Dialysis T TH Sat.   - Makes small amount of urine, incontinent at baseline  16. Vertigo/Dizziness -likely central vertigo but may be complicated by orthostasis -- Reports occipital HA, blurry vision and dizziness once up-vestibular eval pending - 1/6 Add PRN meclizine  12.5 mg TID, scopolamine  patch - 1-8: Worsening vertigo today, but appears scopolamine  patch fell off; ordered replacement-- 1/10 ongoing symptoms. Sl better. Continue patch, acclimation  LOS: 5 days A FACE TO FACE EVALUATION WAS PERFORMED  Arthea ONEIDA Gunther 11/28/2024, 8:22 AM     "

## 2024-11-29 DIAGNOSIS — S069X9D Unspecified intracranial injury with loss of consciousness of unspecified duration, subsequent encounter: Secondary | ICD-10-CM | POA: Diagnosis not present

## 2024-11-29 DIAGNOSIS — E119 Type 2 diabetes mellitus without complications: Secondary | ICD-10-CM | POA: Diagnosis not present

## 2024-11-29 DIAGNOSIS — G4733 Obstructive sleep apnea (adult) (pediatric): Secondary | ICD-10-CM | POA: Diagnosis not present

## 2024-11-29 DIAGNOSIS — H8110 Benign paroxysmal vertigo, unspecified ear: Secondary | ICD-10-CM | POA: Diagnosis not present

## 2024-11-29 LAB — GLUCOSE, CAPILLARY
Glucose-Capillary: 107 mg/dL — ABNORMAL HIGH (ref 70–99)
Glucose-Capillary: 204 mg/dL — ABNORMAL HIGH (ref 70–99)
Glucose-Capillary: 242 mg/dL — ABNORMAL HIGH (ref 70–99)
Glucose-Capillary: 267 mg/dL — ABNORMAL HIGH (ref 70–99)

## 2024-11-29 NOTE — Progress Notes (Signed)
 Physical Therapy Session Note  Patient Details  Name: Brendan [name]. MRN: [id number] Date of Birth: Aug 07, 1956  Today's Date: 11/29/2024 PT Individual Time: 9052-8952 PT Individual Time Calculation (min): 60 min   Short Term Goals: Week 1:  PT Short Term Goal 1 (Week 1): Pt will complete transfer with supervision with LRAD PT Short Term Goal 2 (Week 1): Pt will ambulate 100' with LRAD and CGA PT Short Term Goal 3 (Week 1): Pt will complete up/down 4 steps with min assist and BHRs  Skilled Therapeutic Interventions/Progress Updates:     Pt received sitting in w/c; pt agreeable to PT tx and denied pain. BP taken in wheelchair 110/65. Pt denies dizziness or other sxs.  Therex: Gait x 142 ft with RW CGA with one episode of stubbing L toe requiring PT min A to correct balance. Pt fatigued after gait. Seated: alternated LAQ x 15, seated hip abduction x 15, LAQ iso with ankle pump x 15. Sitting tolerance activities performed edge of mat for core stability and posture to assist with gait and functional activity tolerance.   Theract: Pt stated he needed to have a BM. Gait w/c to elevated toilet seat with RW 2x11 ft with PT CGA. Pt with slightly loose stool. CNA notified who notified nursing. Standing tolerance to get cleaned up x 36 sec with RW. STS from toilet seat CGA. PT noted that pt had bil ankle edema L > R. Notified nursing after looking for larger non-skid socks. Nursing located a pair.   Care handed off to nursing [place] who was to look at pt's toe (she said she was to assess it anyway) and would now look at edema. Pt asked for heating pad prior to handoff to nursing. To let nursing know.  Therapy Documentation Precautions:  Precautions Precautions: Fall Recall of Precautions/Restrictions: Intact Precaution/Restrictions Comments: watch BP, HD fluid restriction Restrictions Weight Bearing Restrictions Per Provider Order: No Other Position/Activity Restrictions: increased dizziness with  transitions supine-sit-stand; not orthostatic on eval.   Therapy/Group: Individual Therapy  Alger Ada 11/29/2024, 7:25 AM

## 2024-11-29 NOTE — Progress Notes (Signed)
" °   11/29/24 2118  BiPAP/CPAP/SIPAP  BiPAP/CPAP/SIPAP Pt Type Adult (Pt places himself on home unit. RT will assist as needed.)  Mask Type Nasal pillows  Mask Size Medium  EPAP 4 cmH2O  Patient Home Machine Yes  Patient Home Mask Yes  Patient Home Tubing Yes    "

## 2024-11-29 NOTE — Progress Notes (Signed)
 Physical Therapy Session Note  Patient Details  Name: Brendan [name]. MRN: [id number] Date of Birth: 1956-08-29  Today's Date: 11/29/2024 PT Individual Time: 8697-8665 PT Individual Time Calculation (min): 32 min   Short Term Goals: Week 1:  PT Short Term Goal 1 (Week 1): Pt will complete transfer with supervision with LRAD PT Short Term Goal 2 (Week 1): Pt will ambulate 100' with LRAD and CGA PT Short Term Goal 3 (Week 1): Pt will complete up/down 4 steps with min assist and BHRs  Skilled Therapeutic Interventions/Progress Updates:    Session focused on functional transfers with RW, strengthening of BLE, and gait with RW for overall mobility retraining ,strength, and endurance. Pt performed transfers with CGA overall with RW with cues for technique. Instructed in standing hip abduction x 10 reps each bilaterally, heel raises x 10 reps, and sit <> stands x 3 reps, and seated LAQ with 10 sec hold with 2# ankle weights x 15 reps. Discussed and educated on overall goals/discharge planning as well as rehab progress  - pt expresses he doesn't feel like he is making progress fast enough. Gait ~ 100' before fatigued and required seated rest break - noted L foot drag/trip x 2 during gait which pt reported as weakness vs shoes catching. Returned to room and set up with all needs in reach.  Therapy Documentation Precautions:  Precautions Precautions: Fall Recall of Precautions/Restrictions: Intact Precaution/Restrictions Comments: watch BP, HD fluid restriction Restrictions Weight Bearing Restrictions Per Provider Order: No Other Position/Activity Restrictions: increased dizziness with transitions supine-sit-stand; not orthostatic on eval.    Pain: Pain Assessment Pain Scale: 0-10 Pain Score: 0-No pain    Therapy/Group: Individual Therapy  Brendan [name] Brendan [name] Brendan [name], PT, DPT, CBIS  11/29/2024, 1:41 PM

## 2024-11-29 NOTE — Plan of Care (Signed)
" °  Problem: Consults Goal: RH GENERAL PATIENT EDUCATION Description: See Patient Education module for education specifics. Outcome: Progressing   Problem: RH BOWEL ELIMINATION Goal: RH STG MANAGE BOWEL WITH ASSISTANCE Description: STG Manage Bowel with mod I aupervision Assistance. Outcome: Progressing   Problem: RH SKIN INTEGRITY Goal: RH STG SKIN FREE OF INFECTION/BREAKDOWN Description: Manage skin free of infections with mod I - supervision assistance  Outcome: Progressing   Problem: RH SAFETY Goal: RH STG ADHERE TO SAFETY PRECAUTIONS W/ASSISTANCE/DEVICE Description: STG Adhere to Safety Precautions With mod I supervision Assistance/Device. Outcome: Progressing   Problem: RH PAIN MANAGEMENT Goal: RH STG PAIN MANAGED AT OR BELOW PT'S PAIN GOAL Description: <4 w/ prns Outcome: Progressing   Problem: RH KNOWLEDGE DEFICIT GENERAL Goal: RH STG INCREASE KNOWLEDGE OF SELF CARE AFTER HOSPITALIZATION Description: Manage increase knowledge of self care after hospitalization with mod I- supervision assistance from wife using educational materials provided Outcome: Progressing   "

## 2024-11-29 NOTE — Progress Notes (Signed)
 "                                                        PROGRESS NOTE   Subjective/Complaints:  Slept pretty well again last night. Does well with CPAP. Wearing scopolamine  and using meclizine , both helping dizziness. No other issues this morning  ROS: Patient denies fever, rash, sore throat, blurred vision,   nausea, vomiting, diarrhea, cough, shortness of breath or chest pain, joint or back/neck pain, headache, or mood change.    Objective:   No results found. Recent Labs    11/28/24 0601  WBC 13.1*  HGB 7.9*  HCT 23.8*  PLT 308   Recent Labs    11/28/24 0601  NA 130*  K 4.3  CL 93*  CO2 28  GLUCOSE 194*  BUN 32*  CREATININE 5.96*  CALCIUM 8.4*    Intake/Output Summary (Last 24 hours) at 11/29/2024 0751 Last data filed at 11/28/2024 1709 Gross per 24 hour  Intake 460 ml  Output 3025 ml  Net -2565 ml        Physical Exam: Vital Signs Blood pressure (!) 120/59, pulse 66, temperature 98.9 F (37.2 C), temperature source Oral, resp. rate 18, height 6' 2 (1.88 m), weight 123.5 kg, SpO2 99%. Constitutional: No distress . Vital signs reviewed. HEENT: NCAT, EOMI, oral membranes moist. Wearing CPAP Neck: supple Cardiovascular: RRR without murmur. No JVD    Respiratory/Chest: CTA Bilaterally without wheezes or rales. Normal effort    GI/Abdomen: BS +, non-tender, non-distended Ext: no clubbing, cyanosis, or edema Psych: pleasant and cooperative  Skin:  Psoriasis, flaky skin bilateral palms and feet. --Improved Callus under right great toe.  Tunneled dialysis cath left internal jugular.  Perineal wound packed this morning  L 1st toe wound - is covered      MSK:  No apparent deformity.  + TTP right greater than left cervical paraspinals and occiput--approved today 1-9  Neurologic exam:  Alert and oriented x 3.    Reasonable insight and awareness.. Mild memory deficits Normal speech and language. Cranial nerves II through XII intact 4-5 strength  distal and proximal bilateral upper and lower extremities. Peripheral neuropathy in bilateral feet distal to the ankle worse on the left No tone Reflexes intact No ataxia or nystagmus appreciated this am   Assessment/Plan: 1. Functional deficits which require 3+ hours per day of interdisciplinary therapy in a comprehensive inpatient rehab setting. Physiatrist is providing close team supervision and 24 hour management of active medical problems listed below. Physiatrist and rehab team continue to assess barriers to discharge/monitor patient progress toward functional and medical goals  Care Tool:  Bathing    Body parts bathed by patient: Right arm, Left arm, Chest, Abdomen, Front perineal area, Buttocks, Right upper leg, Left upper leg, Right lower leg, Left lower leg, Face   Body parts bathed by helper: Left lower leg, Right lower leg, Buttocks     Bathing assist Assist Level: Supervision/Verbal cueing     Upper Body Dressing/Undressing Upper body dressing   What is the patient wearing?: Pull over shirt    Upper body assist Assist Level: Set up assist    Lower Body Dressing/Undressing Lower body dressing      What is the patient wearing?: Pants, Underwear/pull up     Lower body assist Assist for  lower body dressing: Maximal Assistance - Patient 25 - 49%     Toileting Toileting    Toileting assist Assist for toileting: Moderate Assistance - Patient 50 - 74%     Transfers Chair/bed transfer  Transfers assist     Chair/bed transfer assist level: Contact Guard/Touching assist     Locomotion Ambulation   Ambulation assist      Assist level: Minimal Assistance - Patient > 75% Assistive device: Walker-rolling Max distance: 24'   Walk 10 feet activity   Assist     Assist level: Minimal Assistance - Patient > 75% Assistive device: Walker-rolling   Walk 50 feet activity   Assist Walk 50 feet with 2 turns activity did not occur: Safety/medical  concerns         Walk 150 feet activity   Assist Walk 150 feet activity did not occur: Safety/medical concerns         Walk 10 feet on uneven surface  activity   Assist Walk 10 feet on uneven surfaces activity did not occur: Safety/medical concerns         Wheelchair     Assist Is the patient using a wheelchair?: Yes Type of Wheelchair: Manual    Wheelchair assist level: Dependent - Patient 0%      Wheelchair 50 feet with 2 turns activity    Assist        Assist Level: Dependent - Patient 0%   Wheelchair 150 feet activity     Assist      Assist Level: Dependent - Patient 0%   Blood pressure (!) 120/59, pulse 66, temperature 98.9 F (37.2 C), temperature source Oral, resp. rate 18, height 6' 2 (1.88 m), weight 123.5 kg, SpO2 99%.   Medical Problem List and Plan: 1. Functional deficits secondary to debility due subacute TBI with SDH s/p fall             -patient may  shower             -ELOS/Goals: 10-12 days, supervision to mod I goals  -Continue CIR therapies including PT, OT, and SLP   2.  Antithrombotics: -DVT/anticoagulation:  Pharmaceutical: Heparin              -antiplatelet therapy: N/A  3. Pain Management: Tylenol  prn.   1-8: Tension headache; add Voltaren  gel 4 times daily and as needed lidocaine  patches to posterior neck--symptoms improved 1/11  4. Mood/Behavior/Sleep: LCSW to follow for evaluation and support.              -antipsychotic agents: N/A              1/6: Add as needed melatonin 5 mg at bedtime   -1/10-11. Sleeping better using CPAP, minimized interruptions  5. Neuropsych/cognition: This patient may be capable of making decisions on his own behalf.   - 1/6: Cog eval limiting - patient with limited participation and dizziness - SLP not noting true cog deficits so not picking up for ongoing therapies  1-11--sleep improved which helps his arousal  6. Skin/Wound Care: Routine pressure relief measures              -eucerin cream to bilateral hands and feet bid, pt states he's been dealing with it for several months. psoriasis-- follows with Dr. Tarfeen (dermatology) for the past 6 years    - R toe abrasion   - Wound care in place for scrotum/peroneal wound  7. Fluids/Electrolytes/Nutrition: Strict I/O. Labs with HD   - AM labs  with stable hyponatremia; monitor w/ dialysis  8. MSSA/ Proteus mirabilis bacteremia/monocytosis: cefazolin  (changed from Cefepime  on 1/3 by ID) with HD till 1/9 to complete 2 weeks following line removal   - Levaquin  300 mg Q48H added on 01/04 for additional week  - 1/8: Per Dr Overton, patient with persistent monocytosis, would not change regimen unless s/s infection -   need hematology outpatient f/u on discharge -1/10 wbc's much improved today 13k---f/u Monday  9.  T2DM: Hgb A1C- 8.4%. Monitor BS ac/hs and use SSI for elevated BS             --continue Lantus  increased to 25 units on 01/05 and on 5 units TID for meal coverage.              1/7: Eating 25 to 80% of meals.  Cautious to overdo meal coverage given variability.  Increase Lantus  to 30 units daily.  1-8: Blood sugars looking better, monitor today  1-9: Increase Semglee  to 35 units; if further increases, would split to twice daily  1/10 observe cbg's today as just received the 35u dose  1/11 CBG's better yesterday with increased semglee --no changes today Recent Labs    11/28/24 1701 11/28/24 2001 11/29/24 0542  GLUCAP 133* 140* 107*      10. HTN: BP has been on low side likely due to sepsis.  --On coreg  6.25 mg bid. Continue to hold Norvasc .  -1-6 stable--monitor closely and get orthostatics in a.m. 1/7: Mildly hypertensive this a.m.  Variability with dialysis, will add as needed hydralazine  for SBP greater than 170 and monitor 1/8: resume norvasc  at low dose 2.5 mg daily. Continue to hold torsemide  - 1/10-11 fair to borderline control. No changes    11/29/2024    5:20 AM 11/28/2024    8:00 PM 11/28/2024     5:03 PM  Vitals with BMI  Weight 272 lbs 4 oz    BMI 34.94    Systolic 120 149 853  Diastolic 59 64 73  Pulse 66 70 63    11. Fournier's gangrene s/p I & D 08/2024: Continue to pack wound bid.   12. Anemia of chronic disease: On weekly aranesp . Continue to monitor H/H. - Labs with dialysis  13.  OSA: Continue CPAP  14. Constipation: On Miralax  and senna but refusing second dose-->will simplify regimen.   - LBM 1/5, large  15. ESRD. Dialysis T TH Sat.   - Makes small amount of urine, incontinent at baseline  16. Vertigo/Dizziness -likely central vertigo but may be complicated by orthostasis -- Reports occipital HA, blurry vision and dizziness once up-vestibular eval pending - 1/6 Add PRN meclizine  12.5 mg TID, scopolamine  patch - 1-8: Worsening vertigo today, but appears scopolamine  patch fell off; ordered replacement-- 1/10-11 ongoing symptoms, somewhat controlled with scopolamine , meclizine . Continue acclimation  LOS: 6 days A FACE TO FACE EVALUATION WAS PERFORMED  Brendan [name] 11/29/2024, 7:51 AM     "

## 2024-11-29 NOTE — Progress Notes (Signed)
 Occupational Therapy Session Note  Patient Details  Name: Brendan [name]. MRN: [id number] Date of Birth: 29-Apr-1956  Today's Date: 11/29/2024 OT Individual Time: 9294-9184 OT Individual Time Calculation (min): 70 min   Today's Date: 11/29/2024 OT Individual Time: 1405-1430 OT Individual Time Calculation (min): 25 min    Short Term Goals: Week 1:  OT Short Term Goal 1 (Week 1): Patient to perform grooming sitting at the sink with Set up OT Short Term Goal 2 (Week 1): Patient to perform LE dressing with AE and Min assist OT Short Term Goal 3 (Week 1): Patient to perform toileting with CGA OT Short Term Goal 4 (Week 1): Patient to perform shower transfer with SBA.  Skilled Therapeutic Interventions/Progress Updates:   Session 1:  Pt greeted sitting EOB, reports of dizziness with mobility. Revisited previously assigned VOR exercises. LPN administers medications during session. Pt with need to void, BSC with improved fit retrieved. Sit<>stand from heightened bed at [facility] level, ambulatory toilet transfer in similar fashion with use of RW. Min A provided for thorough posterior periarea care, requiring encouragement to attempt task in standing. Remainder of session focused on BADL retraining at sponge-bathing level due to HD catheter. Pt bathes/dresses LB with close supervision for standing balance and figure-4 technique to reach distal BLE. UB care at setup A with OT washing back/applied moisturizing cream for skin integrity. Pt dependently transported from room<>ortho gym for energy management. In ortho gym, pt instructed in [facility] on ScifFIt modality for BUE conditioning/endurance. Pt completes ~10 mins with resistance levels ranging from 2-6 and requiring 2 x 2 min rest-break, verbal cues provided for efficient pacing to achieve lengthier strides of activity. Pt remained sitting in WC with all immediate needs met and call bell within reach.   Session 2: Pt greeted sitting in WC, no  reports of pain. Session focused on BUE strengthening/conditioning for carryover into ADLs and functional transfers. Dependent transport to therapy locations for energy conservation. In main therapy gym, pt instructed in 1 x 1 min cycles of chest press, over head press, lateral raises, and bicep curls. And 1x1 min of ball toss onto ball rebounder.  Multimodal cuing provided for correct posture/muscular activation. Pt uses 2-4# medicine balls with lesser weight utilized during overhead press and lateral raises. Pt remained sitting in WC with all immediate needs met.   Therapy Documentation Precautions:  Precautions Precautions: Fall Recall of Precautions/Restrictions: Intact Precaution/Restrictions Comments: watch BP, HD fluid restriction Restrictions Weight Bearing Restrictions Per Provider Order: No Other Position/Activity Restrictions: increased dizziness with transitions supine-sit-stand; not orthostatic on eval.   Therapy/Group: Individual Therapy  Nereida Habermann, OTR/L, MSOT  11/29/2024, 5:24 AM

## 2024-11-29 NOTE — Progress Notes (Signed)
 " [facility] Progress Note   Subjective:   Patient seen and examined in room.  Sitting in wheelchair.  Tolerated dialysis well. No acute events overnight.  Denies CP, SOB, abdominal pain and n/v/d.   Objective Vitals:   11/28/24 1703 11/28/24 2000 11/29/24 0520 11/29/24 1407  BP: (!) 146/73 (!) 149/64 (!) 120/59 132/62  Pulse: 63 70 66 63  Resp: 19 18 18 18   Temp: 98.5 F (36.9 C) 99.2 F (37.3 C) 98.9 F (37.2 C) 98.6 F (37 C)  TempSrc: Oral Oral Oral Oral  SpO2: 100% 97% 99% 100%  Weight:   123.5 kg   Height:       Physical Exam General:well appearing male in NAD, sitting up in wheelchair Heart:RRR Lungs:CTAB, nml WOB on RA Abdomen:soft, NTND Extremities:1+ LE edema Dialysis Access: [facility]   Filed Weights   11/28/24 1639 11/28/24 1642 11/29/24 0520  Weight: 123.8 kg 123.8 kg 123.5 kg    Intake/Output Summary (Last 24 hours) at 11/29/2024 1510 Last data filed at 11/29/2024 1310 Gross per 24 hour  Intake 220 ml  Output 3025 ml  Net -2805 ml    Additional Objective Labs: Basic Metabolic Panel: Recent Labs  Lab 11/23/24 0431 11/23/24 1549 11/24/24 1156 11/28/24 0601  NA 130* 131* 130* 130*  K 4.0 3.9 4.3 4.3  CL 94* 94* 93* 93*  CO2 25 27 25 28   GLUCOSE 175* 179* 232* 194*  BUN 29* 32* 39* 32*  CREATININE 5.20* 5.91* 6.73* 5.96*  CALCIUM 8.2* 8.2* 8.3* 8.4*  PHOS 4.4 4.7* 4.9*  --    Liver Function Tests: Recent Labs  Lab 11/23/24 0431 11/23/24 1549 11/24/24 1156  ALBUMIN  2.2* 2.4* 2.5*   CBC: Recent Labs  Lab 11/23/24 0431 11/23/24 1549 11/24/24 1156 11/28/24 0601  WBC 21.8* 21.5* 23.7* 13.1*  NEUTROABS 19.0*  --  20.7* 9.2*  HGB 8.1* 8.3* 8.8* 7.9*  HCT 24.5* 25.1* 26.3* 23.8*  MCV 83.6 83.7 82.7 82.1  PLT 220 233 226 308   CBG: Recent Labs  Lab 11/28/24 1138 11/28/24 1701 11/28/24 2001 11/29/24 0542 11/29/24 1144  GLUCAP 221* 133* 140* 107* 204*   Medications:   ceFAZolin  (ANCEF ) IV 2 g (11/28/24 1742)     amLODipine   2.5 mg Oral Daily   calcitRIOL   0.25 mcg Oral Daily   carvedilol   6.25 mg Oral BID WC   Chlorhexidine  Gluconate Cloth  6 each Topical BID   Chlorhexidine  Gluconate Cloth  6 each Topical Q0600   darbepoetin (ARANESP ) injection - DIALYSIS  100 mcg Subcutaneous Q Tue-1800   diclofenac  Sodium  2 g Topical QID   dorzolamide   1 drop Both Eyes Daily   heparin   5,000 Units Subcutaneous Q8H   hydrocerin   Topical BID   insulin  aspart  0-5 Units Subcutaneous QHS   insulin  aspart  0-6 Units Subcutaneous TID WC   insulin  aspart  5 Units Subcutaneous TID WC   insulin  glargine  35 Units Subcutaneous Daily   levothyroxine   125 mcg Oral q AM   scopolamine   1 patch Transdermal Q72H   senna-docusate  1 tablet Oral BID   simvastatin   20 mg Oral QPM   timolol   1 drop Both Eyes Daily    Dialysis Orders: ESRD MTuThF (note that AKI has progressed to ESRD) EDW 125kg - he has been at [facility] - S/p line holiday 12/26 to 12/30 - HD per TTS schedule here - next HD tomorrow   Assessment/Plan: 1. Sepsis 2/2  MSSA and proteus bacteremia - suspected TDC infection s/p line holiday.  Replaced on 11/17/24. WBC finally trending down. Per ID needs IV cefazolin  w/ HD x 4 weeks thru 12/11/2024.  To complete Levaquin  course during admission on 11/29/24.    2. ESRD - previously considered AKI, now deemed ESRD.  HD on TTS schedule while admitted. 3. Anemia of CKD- Hgb 7.9. On ESA - aranesp  100mcg qSat.  No iron  with recent infection/IV antibiotics.  4. Secondary hyperparathyroidism - Ca and phos in goal. Not on binders. Continue VDRA.  5. HTN/volume - Blood pressure elevated today.  Continue coreg  6.25mg  BID and amlodipine  2.5mg  at bedtime.  LE edema on exam. Max UF as tolerated. Will need lower EDW on d/c.   6. Nutrition - Renal diet w/fluid restrictions.  7. Recent Traumatic brain injury 2/2 to fall - in CIR.  Manuelita Labella, PA-C [facility] 11/29/2024,3:10 PM  LOS: 6 days    "

## 2024-11-30 DIAGNOSIS — S069XAS Unspecified intracranial injury with loss of consciousness status unknown, sequela: Secondary | ICD-10-CM | POA: Diagnosis not present

## 2024-11-30 LAB — GLUCOSE, CAPILLARY
Glucose-Capillary: 109 mg/dL — ABNORMAL HIGH (ref 70–99)
Glucose-Capillary: 158 mg/dL — ABNORMAL HIGH (ref 70–99)
Glucose-Capillary: 155 mg/dL — ABNORMAL HIGH (ref 70–99)
Glucose-Capillary: 250 mg/dL — ABNORMAL HIGH (ref 70–99)

## 2024-11-30 MED ORDER — INSULIN ASPART 100 UNIT/ML IJ SOLN
8.0000 [IU] | Freq: Three times a day (TID) | INTRAMUSCULAR | Status: DC
  Administered 2024-11-30 – 2024-12-02 (×6): 8 [IU] via SUBCUTANEOUS
  Filled 2024-11-30 (×8): qty 8

## 2024-11-30 MED ORDER — INSULIN GLARGINE 100 UNIT/ML ~~LOC~~ SOLN: 20.0000 [IU] | Freq: Two times a day (BID) | SUBCUTANEOUS | Status: DC

## 2024-11-30 MED ORDER — INSULIN GLARGINE 100 UNIT/ML ~~LOC~~ SOLN
35.0000 [IU] | Freq: Every day | SUBCUTANEOUS | Status: DC
  Administered 2024-11-30 – 2024-12-01 (×2): 35 [IU] via SUBCUTANEOUS
  Filled 2024-11-30 (×3): qty 0.35

## 2024-11-30 NOTE — Plan of Care (Signed)
" °  Problem: Consults Goal: RH GENERAL PATIENT EDUCATION Description: See Patient Education module for education specifics. Outcome: Progressing   Problem: RH BOWEL ELIMINATION Goal: RH STG MANAGE BOWEL WITH ASSISTANCE Description: STG Manage Bowel with mod I aupervision Assistance. Outcome: Progressing   Problem: RH SKIN INTEGRITY Goal: RH STG SKIN FREE OF INFECTION/BREAKDOWN Description: Manage skin free of infections with mod I - supervision assistance  Outcome: Progressing   Problem: RH SAFETY Goal: RH STG ADHERE TO SAFETY PRECAUTIONS W/ASSISTANCE/DEVICE Description: STG Adhere to Safety Precautions With mod I supervision Assistance/Device. Outcome: Progressing   Problem: RH PAIN MANAGEMENT Goal: RH STG PAIN MANAGED AT OR BELOW PT'S PAIN GOAL Description: <4 w/ prns Outcome: Progressing   Problem: RH KNOWLEDGE DEFICIT GENERAL Goal: RH STG INCREASE KNOWLEDGE OF SELF CARE AFTER HOSPITALIZATION Description: Manage increase knowledge of self care after hospitalization with mod I- supervision assistance from wife using educational materials provided Outcome: Progressing   "

## 2024-11-30 NOTE — Progress Notes (Signed)
 Occupational Therapy Session Note  Patient Details  Name: Brendan [name]. MRN: [id number] Date of Birth: 14-Nov-1956  Today's Date: 11/30/2024 OT Individual Time: 0950-1100 OT Individual Time Calculation (min): 70 min    Short Term Goals: Week 1:  OT Short Term Goal 1 (Week 1): Patient to perform grooming sitting at the sink with Set up OT Short Term Goal 2 (Week 1): Patient to perform LE dressing with AE and Min assist OT Short Term Goal 3 (Week 1): Patient to perform toileting with CGA OT Short Term Goal 4 (Week 1): Patient to perform shower transfer with SBA.  Skilled Therapeutic Interventions/Progress Updates:    Pt received sitting up with c/o back pain, un-rated, frequently repositioning in the chair d/t discomfort. Session focused on vestibular evaluation. Impression: Suspect L unilateral vestibulopathy at this time. Cannot fully rule out a L posterior canal BPPV involvement but d/t poor cervical ROM and pain in cervical and lower back, did not complete any BPPV treatment. May attempt in the future if initiation of vestibular rehab does not yield improvements. He has a long standing history of room spinning vertigo with sit > supine, and now oscillopsia. Prescribed dynamic saccade training, VOR x1 and VOR x2, 3 x a day for 20 minutes. Reviewed exercises and provided extensive education throughout session re indication. Pt in a lot of lower back pain at close of session so he remained supine. Positioned him in partial side lying with several heat packs along his back. Bed alarm set.     11/30/24 0001  Symptom Behavior  Subjective history of current problem Long history of room spinning vertigo, and now with worsening oscillopsia  Type of Dizziness  Oscillopsia;Spinning  Frequency of Dizziness Every day  Duration of Dizziness 30 seconds- 1 minute  Symptom Nature Positional;Motion provoked  Aggravating Factors Lying supine  Relieving Factors Closing eyes  Progression of Symptoms Worse   Oculomotor Exam  Oculomotor Alignment Normal  Ocular ROM WNL  Spontaneous Absent  Gaze-induced  Right beating nystagmus with R gaze  Head shaking Horizontal Absent  Head Shaking Vertical Absent  Smooth Pursuits Saccades  Saccades Overshoots;Slow  Oculomotor Exam-Fixation Suppressed   Ocular Alignment WNL  Ocular ROM WNL  Left Head Impulse WNL  Right Head Impulse WNL  Head Shaking Nystagmus-Horizontal WNL  Vestibulo-Ocular Reflex  VOR 1 Head Only (x 1 viewing) Impaired  VOR 2 Head and Object (x 2 viewing) Impaired  VOR to Slow Head Movement Positive right  VOR Cancellation Corrective saccades  Visual Acuity  Static Near constant oscillopsia  Positional Testing  Dix-Hallpike Dix-Hallpike Right;Dix-Hallpike Left  Sidelying Test Sidelying Right;Sidelying Left  Horizontal Canal Testing Horizontal Canal Right;Horizontal Canal Left  Dix-Hallpike Right  Dix-Hallpike Right Duration no nystagmus, dizziness with transition to supine  Dix-Hallpike Right Symptoms No nystagmus  Dix-Hallpike Left  Dix-Hallpike Left Duration Possible Horizontal, geotropic nystagmus, very brief, with room spinning,  Dix-Hallpike Left Symptoms No nystagmus  Sidelying Right  Sidelying Right Symptoms No nystagmus  Sidelying Left  Sidelying Left Symptoms No nystagmus  Horizontal Canal Right  Horizontal Canal Right Duration none  Horizontal Canal Right Symptoms Normal  Horizontal Canal Left  Horizontal Canal Left Duration none  Horizontal Canal Left Symptoms Normal  Cognition  Cognition Orientation Level Appropriate for developmental age  Positional Sensitivities  Sit to Supine 3  Supine to Left Side 1  Supine to Right Side 1  Supine to Sitting 1  Right Hallpike 3  Up from Right Hallpike 2  Up from Left  Hallpike 2  Nose to Right Knee 0  Right Knee to Sitting 0  Nose to Left Knee 0  Left Knee to Sitting 0  Head Turning x 5 3  Head Nodding x 5 3  Rolling Right 1  Rolling Left 1    Therapy  Documentation Precautions:  Precautions Precautions: Fall Recall of Precautions/Restrictions: Intact Precaution/Restrictions Comments: watch BP, HD fluid restriction Restrictions Weight Bearing Restrictions Per Provider Order: No Other Position/Activity Restrictions: increased dizziness with transitions supine-sit-stand; not orthostatic on eval.  Therapy/Group: Individual Therapy  Nena VEAR Moats 11/30/2024, 8:16 AM

## 2024-11-30 NOTE — Progress Notes (Signed)
 Occupational Therapy Session Note  Patient Details  Name: Brendan [name]. MRN: [id number] Date of Birth: 1956/05/02  Today's Date: 11/30/2024 OT Individual Time: 9194-9143 OT Individual Time Calculation (min): 51 min    Short Term Goals: Week 1:  OT Short Term Goal 1 (Week 1): Patient to perform grooming sitting at the sink with Set up OT Short Term Goal 2 (Week 1): Patient to perform LE dressing with AE and Min assist OT Short Term Goal 3 (Week 1): Patient to perform toileting with CGA OT Short Term Goal 4 (Week 1): Patient to perform shower transfer with SBA.  Skilled Therapeutic Interventions/Progress Updates:   Pt greeted sitting EOB, un-rated reports of back pain, heat provided to area at end of session. Sit>stand from heightened EOB with close supervision, ambulatory toilet transfer in similar fashion with use of RW. Pt provided with extended time to void but no success, patient initiates pericare with lateral leans but not entirely successful with cleaning in that positioning, encouraged to stand and complete care, CGA for standing balance. Remainder of session focused on BADL retraining at sponge-bathing level due to HD catheter. Pt bathes/dresses LB with close supervision for standing balance and figure-4 technique to reach distal BLE. Pt declines care of B-feet due to spouse . . . Just greased them. . ..  UB care at setup A with OT washing back/applied moisturizing cream for skin integrity. Pt performs grooming at Mod I level. Pt remained in care of RN with all other immediate needs met.   Therapy Documentation Precautions:  Precautions Precautions: Fall Recall of Precautions/Restrictions: Intact Precaution/Restrictions Comments: watch BP, HD fluid restriction Restrictions Weight Bearing Restrictions Per Provider Order: No Other Position/Activity Restrictions: increased dizziness with transitions supine-sit-stand; not orthostatic on eval.   Therapy/Group: Individual  Therapy  Nereida Habermann, OTR/L, MSOT  11/30/2024, 6:20 AM

## 2024-11-30 NOTE — Progress Notes (Signed)
 "                                                        PROGRESS NOTE   Subjective/Complaints:  No events overnight.  Complaining of some low back pain today, chronic, but otherwise no complaints Vitals stable      11/30/2024    5:34 AM 11/29/2024    8:57 PM 11/29/2024    2:07 PM  Vitals with BMI  Systolic 141 159 867  Diastolic 82 71 62  Pulse 67 63 63    Recent Labs    11/29/24 1638 11/29/24 2053 11/30/24 0535  GLUCAP 242* 267* 109*    Blood sugars over the last few days have been very high in the evenings.  Lower in the mornings. P.o. intakes appropriate   Continent of bladder   Last BM 1-11, large, x 2.  Has been a few days prior to this.  ROS: Patient denies fever, rash, sore throat, blurred vision,   nausea, vomiting, diarrhea, cough, shortness of breath or chest pain, joint or back/neck pain, headache, or mood change.  + Low back pain  Objective:   No results found. Recent Labs    11/28/24 0601  WBC 13.1*  HGB 7.9*  HCT 23.8*  PLT 308   Recent Labs    11/28/24 0601  NA 130*  K 4.3  CL 93*  CO2 28  GLUCOSE 194*  BUN 32*  CREATININE 5.96*  CALCIUM 8.4*    Intake/Output Summary (Last 24 hours) at 11/30/2024 0738 Last data filed at 11/29/2024 1310 Gross per 24 hour  Intake 220 ml  Output --  Net 220 ml        Physical Exam: Vital Signs Blood pressure (!) 141/82, pulse 67, temperature 98.4 F (36.9 C), resp. rate 17, height 6' 2 (1.88 m), weight 123.5 kg, SpO2 100%. Constitutional: No distress . Vital signs reviewed. HEENT: NCAT, EOMI, oral membranes moist. Wearing CPAP Neck: supple Cardiovascular: RRR without murmur. No JVD    Respiratory/Chest: CTA Bilaterally without wheezes or rales. Normal effort    GI/Abdomen: BS +, non-tender, non-distended Ext: no clubbing, cyanosis, or edema Psych: pleasant and cooperative  Skin:  Psoriasis, flaky skin bilateral palms and feet. --Improved Callus under right great toe.  Tunneled dialysis cath  left internal jugular.  Perineal wound -covered L 1st toe wound - is covered   MSK:  No apparent deformity.  + TTP right mid thoracic to upper lumbar's paraspinals  Neurologic exam:  Alert and oriented x 3.    Reasonable insight and awareness.. Mild memory deficits Normal speech and language. Cranial nerves II through XII intact 4-5 strength distal and proximal bilateral upper and lower extremities. Peripheral neuropathy in bilateral feet distal to the ankle worse on the left No tone Reflexes intact No ataxia or nystagmus appreciated this am     Assessment/Plan: 1. Functional deficits which require 3+ hours per day of interdisciplinary therapy in a comprehensive inpatient rehab setting. Physiatrist is providing close team supervision and 24 hour management of active medical problems listed below. Physiatrist and rehab team continue to assess barriers to discharge/monitor patient progress toward functional and medical goals  Care Tool:  Bathing    Body parts bathed by patient: Right arm, Left arm, Chest, Abdomen, Front perineal area, Buttocks, Right upper leg, Left  upper leg, Right lower leg, Left lower leg, Face   Body parts bathed by helper: Left lower leg, Right lower leg, Buttocks     Bathing assist Assist Level: Supervision/Verbal cueing     Upper Body Dressing/Undressing Upper body dressing   What is the patient wearing?: Pull over shirt    Upper body assist Assist Level: Set up assist    Lower Body Dressing/Undressing Lower body dressing      What is the patient wearing?: Pants, Underwear/pull up     Lower body assist Assist for lower body dressing: Supervision/Verbal cueing     Toileting Toileting    Toileting assist Assist for toileting: Moderate Assistance - Patient 50 - 74%     Transfers Chair/bed transfer  Transfers assist     Chair/bed transfer assist level: Contact Guard/Touching assist     Locomotion Ambulation   Ambulation  assist      Assist level: Minimal Assistance - Patient > 75% Assistive device: Walker-rolling Max distance: 24'   Walk 10 feet activity   Assist     Assist level: Minimal Assistance - Patient > 75% Assistive device: Walker-rolling   Walk 50 feet activity   Assist Walk 50 feet with 2 turns activity did not occur: Safety/medical concerns         Walk 150 feet activity   Assist Walk 150 feet activity did not occur: Safety/medical concerns         Walk 10 feet on uneven surface  activity   Assist Walk 10 feet on uneven surfaces activity did not occur: Safety/medical concerns         Wheelchair     Assist Is the patient using a wheelchair?: Yes Type of Wheelchair: Manual    Wheelchair assist level: Dependent - Patient 0%      Wheelchair 50 feet with 2 turns activity    Assist        Assist Level: Dependent - Patient 0%   Wheelchair 150 feet activity     Assist      Assist Level: Dependent - Patient 0%   Blood pressure (!) 141/82, pulse 67, temperature 98.4 F (36.9 C), resp. rate 17, height 6' 2 (1.88 m), weight 123.5 kg, SpO2 100%.   Medical Problem List and Plan: 1. Functional deficits secondary to debility due subacute TBI with SDH s/p fall             -patient may  shower             -ELOS/Goals: 10-12 days, supervision to mod I goals  -Continue CIR therapies including PT, OT, and SLP   2.  Antithrombotics: -DVT/anticoagulation:  Pharmaceutical: Heparin              -antiplatelet therapy: N/A  3. Pain Management: Tylenol  prn.   1-8: Tension headache; add Voltaren  gel 4 times daily and as needed lidocaine  patches to posterior neck--symptoms improved 1/11  1-12: Dr. Nursing to place lidocaine  patches on mid upper back for chronic pain  4. Mood/Behavior/Sleep: LCSW to follow for evaluation and support.              -antipsychotic agents: N/A              1/6: Add as needed melatonin 5 mg at bedtime   -1/10-11. Sleeping  better using CPAP, minimized interruptions  5. Neuropsych/cognition: This patient may be capable of making decisions on his own behalf.   - 1/6: Cog eval limiting - patient with limited participation  and dizziness - SLP not noting true cog deficits so not picking up for ongoing therapies  1-11--sleep improved which helps his arousal  6. Skin/Wound Care: Routine pressure relief measures             -eucerin cream to bilateral hands and feet bid, pt states he's been dealing with it for several months. psoriasis-- follows with Dr. Tarfeen (dermatology) for the past 6 years    - R toe abrasion   - Wound care in place for scrotum/peroneal wound  7. Fluids/Electrolytes/Nutrition: Strict I/O. Labs with HD   - AM labs with stable hyponatremia; monitor w/ dialysis  8. MSSA/ Proteus mirabilis bacteremia/monocytosis: cefazolin  (changed from Cefepime  on 1/3 by ID) with HD till 1/9 to complete 2 weeks following line removal   - Levaquin  300 mg Q48H added on 01/04 for additional week  - 1/8: Per Dr Overton, patient with persistent monocytosis, would not change regimen unless s/s infection -   need hematology outpatient f/u on discharge -1/10 wbc's much improved today 13k---f/u Monday  9.  T2DM: Hgb A1C- 8.4%. Monitor BS ac/hs and use SSI for elevated BS             --continue Lantus  increased to 25 units on 01/05 and on 5 units TID for meal coverage.              1/7: Eating 25 to 80% of meals.  Cautious to overdo meal coverage given variability.  Increase Lantus  to 30 units daily.  1-8: Blood sugars looking better, monitor today  1-9: Increase Semglee  to 35 units; if further increases, would split to twice daily  1/12: Increase Premeal to 8 units Recent Labs    11/29/24 1638 11/29/24 2053 11/30/24 0535  GLUCAP 242* 267* 109*      10. HTN: BP has been on low side likely due to sepsis.  --On coreg  6.25 mg bid. Continue to hold Norvasc .  -1-6 stable--monitor closely and get orthostatics in a.m. 1/7:  Mildly hypertensive this a.m.  Variability with dialysis, will add as needed hydralazine  for SBP greater than 170 and monitor 1/8: resume norvasc  at low dose 2.5 mg daily. Continue to hold torsemide  - Controlled on current regimen    11/30/2024    5:34 AM 11/29/2024    8:57 PM 11/29/2024    2:07 PM  Vitals with BMI  Systolic 141 159 867  Diastolic 82 71 62  Pulse 67 63 63    11. Fournier's gangrene s/p I & D 08/2024: Continue to pack wound bid.   12. Anemia of chronic disease: On weekly aranesp . Continue to monitor H/H. - Labs with dialysis  13.  OSA: Continue CPAP  14. Constipation: On Miralax  and senna but refusing second dose-->will simplify regimen.   - LBM 1/5, large  15. ESRD. Dialysis T TH Sat.   - Makes small amount of urine, incontinent at baseline  16. Vertigo/Dizziness -likely central vertigo but may be complicated by orthostasis -- Reports occipital HA, blurry vision and dizziness once up-vestibular eval pending - 1/6 Add PRN meclizine  12.5 mg TID, scopolamine  patch - 1-8: Worsening vertigo today, but appears scopolamine  patch fell off; ordered replacement-- Was controlled on current regimen  LOS: 7 days A FACE TO FACE EVALUATION WAS PERFORMED  Brendan [name] 11/30/2024, 7:38 AM     "

## 2024-11-30 NOTE — Progress Notes (Signed)
 Physical Therapy Session Note  Patient Details  Name: Brendan [name]. MRN: [id number] Date of Birth: Jun 17, 1956  Today's Date: 11/30/2024 PT Individual Time: 8697-8587 PT Individual Time Calculation (min): 70 min   Short Term Goals: Week 1:  PT Short Term Goal 1 (Week 1): Pt will complete transfer with supervision with LRAD PT Short Term Goal 2 (Week 1): Pt will ambulate 100' with LRAD and CGA PT Short Term Goal 3 (Week 1): Pt will complete up/down 4 steps with min assist and BHRs  Skilled Therapeutic Interventions/Progress Updates:     Pt received seated at EOB and agrees to therapy. No complaint of pain. WC transport to gym for time management. Pt performs sit to stand with CGA and cues for body mechanics. Pt ambulates x175' with RW and CGA, with cues for upright posture and body mechanics to improve balance and energy conservation, as well as increasing stride length to decrease risk for falls. Seated rest break. Pt then performs step ups onto 6 step with bilateral handrails for strengthening. PT provides cues for optimal step sequencing, with pt ascending with RLE and descending with LLE. Pt completes x4 prior to taking rest break. Pt then stands and completes x6 prior to requiring rest break. Pt transitions to perfrming similar activity in parallel bars, completing x4 with minA prior to rest break. Pt completes x6 on final bout.   Pt performs heel raises with BUE support on parallel bars. PT provides cues for body mechanics and correct performance. Pt completes 3x20 with extended seated rest breaks. WC transport back to room. Pt left seated with all needs within reach.   Therapy Documentation Precautions:  Precautions Precautions: Fall Recall of Precautions/Restrictions: Intact Precaution/Restrictions Comments: watch BP, HD fluid restriction Restrictions Weight Bearing Restrictions Per Provider Order: No Other Position/Activity Restrictions: increased dizziness with transitions  supine-sit-stand; not orthostatic on eval.    Therapy/Group: Individual Therapy  Elsie JAYSON Dawn, PT, DPT 11/30/2024, 3:26 PM

## 2024-11-30 NOTE — Progress Notes (Signed)
 "  [facility] Progress Note    Assessment/ Plan:   1. Sepsis 2/2 MSSA and proteus bacteremia - suspected TDC infection s/p line holiday.  Replaced on 11/17/24. WBC finally trending down. Per ID needs IV cefazolin  w/ HD x 4 weeks thru 12/11/2024.  completed Levaquin  course during admission on 11/29/24.    2. ESRD - previously considered AKI, now deemed ESRD.  HD on TTS schedule while admitted. HD tomorrow. Will inform renal navigator 3. Anemia of CKD- Hgb 7.9. On ESA - aranesp  100mcg qSat.  No iron  with recent infection/IV antibiotics.  4. Secondary hyperparathyroidism - Ca and phos in goal. Not on binders. Continue VDRA.  5. HTN/volume - Continue coreg  6.25mg  BID and amlodipine  2.5mg  at bedtime.  LE edema on exam. Max UF as tolerated. Will need lower EDW on d/c.   6. Nutrition - Renal diet w/fluid restrictions.  7. Recent Traumatic brain injury 2/2 to fall - in CIR.  OP Dialysis Orders: ESRD MTuThF (note that AKI has progressed to ESRD) EDW 125kg - he has been at [facility] - S/p line holiday 12/26 to 12/30 - HD per TTS schedule here  Subjective:   Patient seen in room, no complaints.   Objective:   BP (!) 141/82 (BP Location: Right Arm)   Pulse 67   Temp 98.4 F (36.9 C)   Resp 17   Ht 6' 2 (1.88 m)   Wt 123.2 kg   SpO2 100%   BMI 34.87 kg/m   Intake/Output Summary (Last 24 hours) at 11/30/2024 1018 Last data filed at 11/30/2024 0743 Gross per 24 hour  Intake 340 ml  Output --  Net 340 ml   Weight change: -3.8 kg  Physical Exam: Gen: NAD, in wheelchair CVS: RRR Resp: CTA BL Abd: obese, soft Ext: trace edema b/l Les Neuro: awake, alert Dialysis access: [facility]  Imaging: No results found.  Labs: BMET Recent Labs  Lab 11/23/24 1549 11/24/24 1156 11/28/24 0601  NA 131* 130* 130*  K 3.9 4.3 4.3  CL 94* 93* 93*  CO2 27 25 28   GLUCOSE 179* 232* 194*  BUN 32* 39* 32*  CREATININE 5.91* 6.73* 5.96*  CALCIUM 8.2* 8.3* 8.4*  PHOS 4.7* 4.9*  --     CBC Recent Labs  Lab 11/23/24 1549 11/24/24 1156 11/28/24 0601  WBC 21.5* 23.7* 13.1*  NEUTROABS  --  20.7* 9.2*  HGB 8.3* 8.8* 7.9*  HCT 25.1* 26.3* 23.8*  MCV 83.7 82.7 82.1  PLT 233 226 308    Medications:     amLODipine   2.5 mg Oral Daily   calcitRIOL   0.25 mcg Oral Daily   carvedilol   6.25 mg Oral BID WC   Chlorhexidine  Gluconate Cloth  6 each Topical BID   Chlorhexidine  Gluconate Cloth  6 each Topical Q0600   darbepoetin (ARANESP ) injection - DIALYSIS  100 mcg Subcutaneous Q Tue-1800   diclofenac  Sodium  2 g Topical QID   dorzolamide   1 drop Both Eyes Daily   heparin   5,000 Units Subcutaneous Q8H   hydrocerin   Topical BID   insulin  aspart  0-5 Units Subcutaneous QHS   insulin  aspart  0-6 Units Subcutaneous TID WC   insulin  aspart  8 Units Subcutaneous TID WC   insulin  glargine  35 Units Subcutaneous Daily   levothyroxine   125 mcg Oral q AM   scopolamine   1 patch Transdermal Q72H   senna-docusate  1 tablet Oral BID   simvastatin   20 mg Oral QPM  timolol   1 drop Both Eyes Daily      Ephriam Stank, MD [facility] 11/30/2024, 10:18 AM   "

## 2024-12-01 LAB — CBC WITH DIFFERENTIAL/PLATELET
Abs Immature Granulocytes: 0.09 K/uL — ABNORMAL HIGH (ref 0.00–0.07)
Basophils Absolute: 0.2 K/uL — ABNORMAL HIGH (ref 0.0–0.1)
Basophils Relative: 1 %
Eosinophils Absolute: 1.1 K/uL — ABNORMAL HIGH (ref 0.0–0.5)
Eosinophils Relative: 9 %
HCT: 24 % — ABNORMAL LOW (ref 39.0–52.0)
Hemoglobin: 8 g/dL — ABNORMAL LOW (ref 13.0–17.0)
Immature Granulocytes: 1 %
Lymphocytes Relative: 10 %
Lymphs Abs: 1.2 K/uL (ref 0.7–4.0)
MCH: 27.3 pg (ref 26.0–34.0)
MCHC: 33.3 g/dL (ref 30.0–36.0)
MCV: 81.9 fL (ref 80.0–100.0)
Monocytes Absolute: 1.4 K/uL — ABNORMAL HIGH (ref 0.1–1.0)
Monocytes Relative: 12 %
Neutro Abs: 8.3 K/uL — ABNORMAL HIGH (ref 1.7–7.7)
Neutrophils Relative %: 67 %
Platelets: 330 K/uL (ref 150–400)
RBC: 2.93 MIL/uL — ABNORMAL LOW (ref 4.22–5.81)
RDW: 17.2 % — ABNORMAL HIGH (ref 11.5–15.5)
WBC: 12.2 K/uL — ABNORMAL HIGH (ref 4.0–10.5)
nRBC: 0 % (ref 0.0–0.2)

## 2024-12-01 LAB — GLUCOSE, CAPILLARY
Glucose-Capillary: 183 mg/dL — ABNORMAL HIGH (ref 70–99)
Glucose-Capillary: 130 mg/dL — ABNORMAL HIGH (ref 70–99)
Glucose-Capillary: 98 mg/dL (ref 70–99)
Glucose-Capillary: 125 mg/dL — ABNORMAL HIGH (ref 70–99)

## 2024-12-01 MED ORDER — LIDOCAINE 5 % EX PTCH
2.0000 | MEDICATED_PATCH | CUTANEOUS | Status: DC
  Administered 2024-12-01: 2 via TRANSDERMAL
  Filled 2024-12-01: qty 2

## 2024-12-01 MED ORDER — AMLODIPINE BESYLATE 5 MG PO TABS
5.0000 mg | ORAL_TABLET | Freq: Every day | ORAL | Status: DC
  Administered 2024-12-03 – 2024-12-05 (×2): 5 mg via ORAL
  Filled 2024-12-01 (×3): qty 1

## 2024-12-01 NOTE — Progress Notes (Signed)
 Physical Therapy Session Note  Patient Details  Name: Brendan [name]. MRN: [id number] Date of Birth: [birth year]  Today's Date: 12/01/2024 PT Individual Time: 9195-9154 PT Individual Time Calculation (min): 41 min   PT Individual Time: 1035 - 1115 PT Individual Time Calculation (min): 40 min     Short Term Goals: Week 1:  PT Short Term Goal 1 (Week 1): Pt will complete transfer with supervision with LRAD PT Short Term Goal 2 (Week 1): Pt will ambulate 100' with LRAD and CGA PT Short Term Goal 3 (Week 1): Pt will complete up/down 4 steps with min assist and BHRs  Skilled Therapeutic Interventions/Progress Updates:     Pt received seated at EOB and agrees to therapy. No complaint of pain. Does report some dizziness and nurse present to provide meds to address dizziness. Pt performs stand step to [facility] with RW and cues for positioning. WC transport to gym. Pt completes x20 LAQs to prepare knee extensors for mobility. Pt performs sit to stand from [facility] with RW and cues for hand placement. Pt ambulates x50', including 180 degree turn around cone, with RW and cues for upright posture and safe AD management. Following brief rest break, pt completes x5 reps of sit to stand to work on functional strengthening and endurance training. Pt completes total of x3 rounds of ambulation + sit to stand reps. WC transport back to room. Left seated with alarm intact and all needs within reach.   2nd Session: Pt received seated in [facility] and agrees to therapy. No complaint of pain. WC transport to gym for time management. Pt performs stair training on 3 steps for strengthening and mobility challenge. Pt completes 2x20 step up/downs with bilateral handrails and CGA, with cues for body mechanics for optimal safety and efficiency. Pt tends to ascend with stronger RLE and descend with weaker LLE first. Seated rest break. Pt requests to do upper body weight training. PT provides pt with 5lb bar and pt completes high rep biceps  curls to fatigue (x20). Pt also completes x20 overhead shoulder presses, chest presses, and shoulder flexion repetitions. WC transport back to room. Left seated with alarm intact and all needs within reach.   Therapy Documentation Precautions:  Precautions Precautions: Fall Recall of Precautions/Restrictions: Intact Precaution/Restrictions Comments: watch BP, HD fluid restriction Restrictions Weight Bearing Restrictions Per Provider Order: No Other Position/Activity Restrictions: increased dizziness with transitions supine-sit-stand; not orthostatic on eval.   Therapy/Group: Individual Therapy  Elsie JAYSON Dawn, PT, DPT 12/01/2024, 4:20 PM

## 2024-12-01 NOTE — Progress Notes (Signed)
 "                                                        PROGRESS NOTE   Subjective/Complaints:  No events overnight.  Vital stable.  Some ongoing low back pain today, but otherwise no complaints. Evening blood sugar remains elevated in the 250s, other readings are looking much better. Blood pressure stable, generally 130s over 60s, occasional elevations into the 140s and 150s. Last BM 1-11, large, x 2.  Has been a few days prior to this.  ROS: Patient denies fever, rash, sore throat, blurred vision,   nausea, vomiting, diarrhea, cough, shortness of breath or chest pain, joint or back/neck pain, headache, or mood change.  + Low back pain  Objective:   No results found. No results for input(s): WBC, HGB, HCT, PLT in the last 72 hours.  No results for input(s): NA, K, CL, CO2, GLUCOSE, BUN, CREATININE, CALCIUM in the last 72 hours.   Intake/Output Summary (Last 24 hours) at 12/01/2024 0927 Last data filed at 12/01/2024 0737 Gross per 24 hour  Intake 712 ml  Output --  Net 712 ml        Physical Exam: Vital Signs Blood pressure 132/61, pulse 60, temperature 98.1 F (36.7 C), resp. rate 17, height 6' 2 (1.88 m), weight 123.5 kg, SpO2 99%. Constitutional: No distress . Vital signs reviewed.  Sitting upright in therapy gym. HEENT: NCAT, EOMI, oral membranes moist.   Neck: supple Cardiovascular: RRR without murmur. No JVD    Respiratory/Chest: CTA Bilaterally without wheezes or rales. Normal effort    GI/Abdomen: BS +, non-tender, non-distended Ext: no clubbing, cyanosis, or edema Psych: pleasant and cooperative  Skin:  Psoriasis, flaky skin bilateral palms and feet. --Improved Callus under right great toe.  Tunneled dialysis cath left internal jugular.  Perineal wound -covered L 1st toe wound - is covered   MSK:  No apparent deformity.  + TTP right mid thoracic to upper lumbar's paraspinals, with some palpable tightness.  Neurologic exam:   Alert and oriented x 3.    Reasonable insight and awareness.. Mild memory deficits Normal speech and language. Cranial nerves II through XII intact 4/5 strength distal and proximal bilateral upper and lower extremities. Peripheral neuropathy in bilateral feet distal to the ankle worse on the left No tone Reflexes intact No ataxia or nystagmus appreciated this am   Assessment/Plan: 1. Functional deficits which require 3+ hours per day of interdisciplinary therapy in a comprehensive inpatient rehab setting. Physiatrist is providing close team supervision and 24 hour management of active medical problems listed below. Physiatrist and rehab team continue to assess barriers to discharge/monitor patient progress toward functional and medical goals  Care Tool:  Bathing    Body parts bathed by patient: Right arm, Left arm, Chest, Abdomen, Front perineal area, Buttocks, Right upper leg, Left upper leg, Right lower leg, Left lower leg, Face   Body parts bathed by helper: Left lower leg, Right lower leg, Buttocks     Bathing assist Assist Level: Supervision/Verbal cueing     Upper Body Dressing/Undressing Upper body dressing   What is the patient wearing?: Pull over shirt    Upper body assist Assist Level: Set up assist    Lower Body Dressing/Undressing Lower body dressing      What is the  patient wearing?: Pants, Underwear/pull up     Lower body assist Assist for lower body dressing: Supervision/Verbal cueing     Toileting Toileting    Toileting assist Assist for toileting: Moderate Assistance - Patient 50 - 74%     Transfers Chair/bed transfer  Transfers assist     Chair/bed transfer assist level: Contact Guard/Touching assist     Locomotion Ambulation   Ambulation assist      Assist level: Minimal Assistance - Patient > 75% Assistive device: Walker-rolling Max distance: 24'   Walk 10 feet activity   Assist     Assist level: Minimal Assistance -  Patient > 75% Assistive device: Walker-rolling   Walk 50 feet activity   Assist Walk 50 feet with 2 turns activity did not occur: Safety/medical concerns         Walk 150 feet activity   Assist Walk 150 feet activity did not occur: Safety/medical concerns         Walk 10 feet on uneven surface  activity   Assist Walk 10 feet on uneven surfaces activity did not occur: Safety/medical concerns         Wheelchair     Assist Is the patient using a wheelchair?: Yes Type of Wheelchair: Manual    Wheelchair assist level: Dependent - Patient 0%      Wheelchair 50 feet with 2 turns activity    Assist        Assist Level: Dependent - Patient 0%   Wheelchair 150 feet activity     Assist      Assist Level: Dependent - Patient 0%   Blood pressure 132/61, pulse 60, temperature 98.1 F (36.7 C), resp. rate 17, height 6' 2 (1.88 m), weight 123.5 kg, SpO2 99%.   Medical Problem List and Plan: 1. Functional deficits secondary to debility due subacute TBI with SDH s/p fall             -patient may  shower             -ELOS/Goals: 10-12 days, supervision to mod I goals - 1/17 DC   -Continue CIR therapies including PT, OT, and SLP    - 1/13: Perseverates on weakness but walks 175 ft with walker CGA, CGA to Min A with stairs and sit to stand.   2.  Antithrombotics: -DVT/anticoagulation:  Pharmaceutical: Heparin              -antiplatelet therapy: N/A  3. Pain Management: Tylenol  prn.   1-8: Tension headache; add Voltaren  gel 4 times daily and as needed lidocaine  patches to posterior neck--symptoms improved 1/11  1-12: Dr. Nursing to place lidocaine  patches on mid upper back for chronic pain--scheduled 2 patches along with intermittent heat 1-13  4. Mood/Behavior/Sleep: LCSW to follow for evaluation and support.              -antipsychotic agents: N/A              1/6: Add as needed melatonin 5 mg at bedtime   -1/10-11. Sleeping better using CPAP,  minimized interruptions  5. Neuropsych/cognition: This patient may be capable of making decisions on his own behalf.   - 1/6: Cog eval limiting - patient with limited participation and dizziness - SLP not noting true cog deficits so not picking up for ongoing therapies  1-11--sleep improved which helps his arousal  6. Skin/Wound Care: Routine pressure relief measures             -eucerin cream to  bilateral hands and feet bid, pt states he's been dealing with it for several months. psoriasis-- follows with Dr. Tarfeen (dermatology) for the past 6 years    - R toe abrasion   - Wound care in place for scrotum/peroneal wound  7. Fluids/Electrolytes/Nutrition: Strict I/O. Labs with HD   - AM labs with stable hyponatremia; monitor w/ dialysis  8. MSSA/ Proteus mirabilis bacteremia/monocytosis: cefazolin  (changed from Cefepime  on 1/3 by ID) with HD till 1/9 to complete 2 weeks following line removal   - Levaquin  300 mg Q48H added on 01/04 for additional week  - 1/8: Per Dr Overton, patient with persistent monocytosis, would not change regimen unless s/s infection -   need hematology outpatient f/u on discharge -1/10 wbc's much improved today 13k 1-13:  Finished linezolid ; cont IV cefazolin  with HD for 4 weeks through 12-11-2024.  Repeat CBC with dialysis today  9.  T2DM: Hgb A1C- 8.4%. Monitor BS ac/hs and use SSI for elevated BS             --continue Lantus  increased to 25 units on 01/05 and on 5 units TID for meal coverage.              1/7: Eating 25 to 80% of meals.  Cautious to overdo meal coverage given variability.  Increase Lantus  to 30 units daily.  1-8: Blood sugars looking better, monitor today  1-9: Increase Semglee  to 35 units; if further increases, would split to twice daily  1/12: Increase Premeal to 8 units-- improved   Recent Labs    11/30/24 1622 11/30/24 2152 12/01/24 0541  GLUCAP 155* 250* 183*      10. HTN: BP has been on low side likely due to sepsis.  --On coreg  6.25  mg bid. Continue to hold Norvasc .  -1-6 stable--monitor closely and get orthostatics in a.m. 1/7: Mildly hypertensive this a.m.  Variability with dialysis, will add as needed hydralazine  for SBP greater than 170 and monitor 1/8: resume norvasc  at low dose 2.5 mg daily. Continue to hold torsemide  - 1-13: Increase Norvasc  to 5 mg daily    12/01/2024    5:43 AM 12/01/2024    5:00 AM 11/30/2024    9:54 PM  Vitals with BMI  Weight  272 lbs 4 oz   BMI  34.94   Systolic 132   132  146  Diastolic 61   61  68  Pulse 60   60  57    11. Fournier's gangrene s/p I & D 08/2024: Continue to pack wound bid.   12. Anemia of chronic disease: On weekly aranesp . Continue to monitor H/H. - Labs with dialysis  13.  OSA: Continue CPAP  14. Constipation: On Miralax  and senna but refusing second dose-->will simplify regimen.   - LBM 1/11, x 2  15. ESRD. Dialysis T TH Sat.   - Makes small amount of urine, incontinent at baseline  16. Vertigo/Dizziness -likely central vertigo but may be complicated by orthostasis -- Reports occipital HA, blurry vision and dizziness once up-vestibular eval pending - 1/6 Add PRN meclizine  12.5 mg TID, scopolamine  patch - 1-8: Worsening vertigo today, but appears scopolamine  patch fell off; ordered replacement--  - 1/13: Left posteriorlateral vestibulopathy on eval, c/b neck and back pain. Set up with [facility] for vestibular therapy - on Hx is chronic for several years + worsening with recent SDH  LOS: 8 days A FACE TO FACE EVALUATION WAS PERFORMED  Brendan [name] 12/01/2024, 9:27 AM     "

## 2024-12-01 NOTE — Plan of Care (Signed)
" °  Problem: Consults Goal: RH GENERAL PATIENT EDUCATION Description: See Patient Education module for education specifics. Outcome: Progressing   Problem: RH BOWEL ELIMINATION Goal: RH STG MANAGE BOWEL WITH ASSISTANCE Description: STG Manage Bowel with mod I aupervision Assistance. Outcome: Progressing   Problem: RH SKIN INTEGRITY Goal: RH STG SKIN FREE OF INFECTION/BREAKDOWN Description: Manage skin free of infections with mod I - supervision assistance  Outcome: Progressing   Problem: RH SAFETY Goal: RH STG ADHERE TO SAFETY PRECAUTIONS W/ASSISTANCE/DEVICE Description: STG Adhere to Safety Precautions With mod I supervision Assistance/Device. Outcome: Progressing   Problem: RH PAIN MANAGEMENT Goal: RH STG PAIN MANAGED AT OR BELOW PT'S PAIN GOAL Description: <4 w/ prns Outcome: Progressing   Problem: RH KNOWLEDGE DEFICIT GENERAL Goal: RH STG INCREASE KNOWLEDGE OF SELF CARE AFTER HOSPITALIZATION Description: Manage increase knowledge of self care after hospitalization with mod I- supervision assistance from wife using educational materials provided Outcome: Progressing   "

## 2024-12-01 NOTE — Progress Notes (Signed)
 Occupational Therapy Session Note  Patient Details  Name: Brendan [name]. MRN: [id number] Date of Birth: [birth year]  Today's Date: 12/01/2024 OT Individual Time: 1124-1202 OT Individual Time Calculation (min): 38 min    Short Term Goals: Week 2:  OT Short Term Goal 1 (Week 2): STG= LTG d/t ELOS  Skilled Therapeutic Interventions/Progress Updates:    Pt received sitting with un-rated knee and back pain, agreeable to OT session. Pt was taken via w/c to the therapy gym for time management. He completed simulated walk in shower transfer using the RW, performing step in backward technique following OT demonstration. He completed x5 repetitions with the RW with CGA- (S) for the final 2. Pt completed standing level reciprocal tapping activity with no UE support using a 5 in cone 3x 20 repetitions. Activity performed to challenge dynamic standing balance and functional activity tolerance to simulate household threshold management and reduce fall risk. Pt required CGA overall. He then completed UE endurance and strengthening circuit using 5 lb dumbbells completing 3x5-6 curl to push press. Pt required use of rest breaks throughout session for recovery, as well as to support safety and prevent overexertion. During breaks, OT monitored recovery time to assess endurance and response to exertion. He ended with 3x8 forward punches with the 5 lb dumbbells. He returned to his wheelchair, stand pivot with the RW with CGA. Pt was left sitting up in the wheelchair with all needs met and call bell within reach.    Therapy Documentation Precautions:  Precautions Precautions: Fall Recall of Precautions/Restrictions: Intact Precaution/Restrictions Comments: watch BP, HD fluid restriction Restrictions Weight Bearing Restrictions Per Provider Order: No Other Position/Activity Restrictions: increased dizziness with transitions supine-sit-stand; not orthostatic on eval.  Therapy/Group: Individual Therapy  Nena VEAR Moats 12/01/2024, 11:36 AM

## 2024-12-01 NOTE — Progress Notes (Addendum)
 Noted that pt d/c is now 1/7. Contacted [facility] to inform of this and see if they can confirm again chair time and schedule now that pt will be 3x week instead of TCU at 4x week. Once update provided to navigator, I will update team.   Bayron Dalto Dialysis navigator [phone]  Addendum 2:22pm Confirmed with clinic manager Jolene that pt can start Monday the 19th at [facility] for a 12:25pm chair time. Pt will need to arrive 12:05pm first day. Team including attending, social work, CHARITY FUNDRAISER, and nephrologist informed.

## 2024-12-01 NOTE — Progress Notes (Signed)
 Occupational Therapy Weekly Progress Note  Patient Details  Name: Brendan [name]. MRN: [id number] Date of Birth: 1956-04-28  Beginning of progress report period: November 24, 2024 End of progress report period: December 01, 2024  Today's Date: 12/01/2024 OT Individual Time: 0900-1000 OT Individual Time Calculation (min): 60 min   Patient has met 3 of 3 short term goals.  Brendan [name] has made good progress toward his OT goals, progressing to a CGA level with all ADLs and transfers using a RW. He continues to be limited by a L unilateral vestibulopathy that impacts dizziness with movement, generalized weakness/deconditioned, and balance deficits. An adaptation based vestibular rehab program is being initiated and pt would continue to benefit from outpatient vestibular rehab at d/c.   Patient continues to demonstrate the following deficits: muscle weakness, decreased cardiorespiratoy endurance, and decreased standing balance, decreased postural control, and decreased balance strategies and therefore will continue to benefit from skilled OT intervention to enhance overall performance with BADL and iADL.  Patient progressing toward long term goals..  Continue plan of care.  OT Short Term Goals Week 1:  OT Short Term Goal 1 (Week 1): Patient to perform grooming sitting at the sink with Set up OT Short Term Goal 1 - Progress (Week 1): Met OT Short Term Goal 2 (Week 1): Patient to perform LE dressing with AE and Min assist OT Short Term Goal 2 - Progress (Week 1): Met OT Short Term Goal 3 (Week 1): Patient to perform toileting with CGA OT Short Term Goal 3 - Progress (Week 1): Met OT Short Term Goal 4 (Week 1): Patient to perform shower transfer with SBA. OT Short Term Goal 4 - Progress (Week 1): Met Week 2:  OT Short Term Goal 1 (Week 2): STG= LTG d/t ELOS  Skilled Therapeutic Interventions/Progress Updates:    Pt received supine with soreness in his back and knees, agreeable to OT session. He was  wheeled to the sink where he was able to brush teeth and wash face, as well as shave. OT gathered items for self directed vestibular rehabilitation program. Pt was taken via w/c to the therapy gym for time management. He was instructed through daily vestibular rehabilitation program. He began with saccade, tracking ,targets, horizontal head movements, head circles, focusing while turning head, ankle sways, and circle sways. 10-15 repetitions each exercise with frequent cueing to ensure proper form/technique of each exercise. He had intermittent reports of oscillopsia with dizziness during more dynamic head-body movements. He required CGA for standing level ankle and body sways. He returned to his room and was left sitting up with all needs met.    Therapy Documentation Precautions:  Precautions Precautions: Fall Recall of Precautions/Restrictions: Intact Precaution/Restrictions Comments: watch BP, HD fluid restriction Restrictions Weight Bearing Restrictions Per Provider Order: No Other Position/Activity Restrictions: increased dizziness with transitions supine-sit-stand; not orthostatic on eval.  Therapy/Group: Individual Therapy  Nena VEAR Moats 12/01/2024, 8:55 AM

## 2024-12-01 NOTE — Progress Notes (Signed)
 " Brendan [name] Progress Note    Assessment/ Plan:   1. Sepsis 2/2 MSSA and proteus bacteremia - suspected TDC infection s/p line holiday.  Replaced on 11/17/24. WBC finally trending down. Per ID needs IV cefazolin  w/ HD x 4 weeks thru 12/11/2024.  completed Levaquin  course during admission on 11/29/24.    2. ESRD - previously considered AKI, now deemed ESRD.  Has been on TTS sched here, HD today. CLIP: [facility] MWF. Next HD tomorrow to transition to MWF schedule 3. Anemia of CKD- Hgb 7.9. On ESA - aranesp  100mcg qSat.  No iron  with recent infection/IV antibiotics.  4. Secondary hyperparathyroidism - Ca and phos in goal. Not on binders. Continue VDRA.  5. HTN/volume - Continue coreg  6.25mg  BID and amlodipine  2.5mg  at bedtime.  LE edema on exam. Max UF as tolerated. Will need lower EDW on d/c.   6. Nutrition - Renal diet w/fluid restrictions.  7. Recent Traumatic brain injury 2/2 to fall - in CIR.  OP Dialysis Orders: ESRD MTuThF (note that AKI has progressed to ESRD) EDW 125kg - he has been at [facility] - S/p line holiday 12/26 to 12/30 - HD per TTS schedule here  Subjective:   Patient seen in room, no complaints.   Objective:   BP 132/61 (BP Location: Right Arm)   Pulse 60   Temp 98.1 F (36.7 C)   Resp 17   Ht 6' 2 (1.88 m)   Wt 123.5 kg   SpO2 99%   BMI 34.96 kg/m   Intake/Output Summary (Last 24 hours) at 12/01/2024 0934 Last data filed at 12/01/2024 9262 Gross per 24 hour  Intake 712 ml  Output --  Net 712 ml   Weight change: 0.3 kg  Physical Exam: Gen: NAD, in wheelchair CVS: RRR Resp: CTA BL Abd: obese, soft Ext: trace edema b/l Les Neuro: awake, alert Dialysis access: [facility]  Imaging: No results found.  Labs: BMET Recent Labs  Lab 11/24/24 1156 11/28/24 0601  NA 130* 130*  K 4.3 4.3  CL 93* 93*  CO2 25 28  GLUCOSE 232* 194*  BUN 39* 32*  CREATININE 6.73* 5.96*  CALCIUM 8.3* 8.4*  PHOS 4.9*  --    CBC Recent Labs  Lab 11/24/24 1156  11/28/24 0601  WBC 23.7* 13.1*  NEUTROABS 20.7* 9.2*  HGB 8.8* 7.9*  HCT 26.3* 23.8*  MCV 82.7 82.1  PLT 226 308    Medications:     [START ON 12/02/2024] amLODipine   5 mg Oral Daily   calcitRIOL   0.25 mcg Oral Daily   carvedilol   6.25 mg Oral BID WC   Chlorhexidine  Gluconate Cloth  6 each Topical BID   Chlorhexidine  Gluconate Cloth  6 each Topical Q0600   darbepoetin (ARANESP ) injection - DIALYSIS  100 mcg Subcutaneous Q Tue-1800   diclofenac  Sodium  2 g Topical QID   dorzolamide   1 drop Both Eyes Daily   heparin   5,000 Units Subcutaneous Q8H   hydrocerin   Topical BID   insulin  aspart  0-5 Units Subcutaneous QHS   insulin  aspart  0-6 Units Subcutaneous TID WC   insulin  aspart  8 Units Subcutaneous TID WC   insulin  glargine  35 Units Subcutaneous Daily   levothyroxine   125 mcg Oral q AM   scopolamine   1 patch Transdermal Q72H   senna-docusate  1 tablet Oral BID   simvastatin   20 mg Oral QPM   timolol   1 drop Both Eyes Daily  Ephriam Stank, MD [name] 12/01/2024, 9:34 AM   "

## 2024-12-01 NOTE — Patient Care Conference (Signed)
 Inpatient RehabilitationTeam Conference and Plan of Care Update Date: 12/01/2024   Time: 1020 am    Patient Name: Brendan [name].      Medical Record Number: [id number]  Date of Birth: [birth year] Sex: Male         Room/Bed: 4M05C/4M05C-01 Payor Info: Payor: [employer]UE [employer] MEDICARE / Plan: BCBS MEDICARE / Product Type: *No Product type* /    Admit Date/Time:  11/23/2024  2:29 PM  Primary Diagnosis:  TBI (traumatic brain injury) [facility])  Hospital Problems: Principal Problem:   TBI (traumatic brain injury) [facility]) Active Problems:   Debility    Expected Discharge Date: Expected Discharge Date: 12/05/24  Team Members Present: Physician leading conference: Dr. Joesph Likes Social Worker Present: Graeme Jude, LCSW Nurse Present: Eulalio Falls, RN PT Present: Kirt Dawn, PT OT Present: Nena Moats, OT SLP Present: Rosina Downy, SLP     Current Status/Progress Goal Weekly Team Focus  Bowel/Bladder   Patient oliguric and continent of bowels   pt will remain continent of bowel and bladder   assessment q shift    Swallow/Nutrition/ Hydration               ADL's   Setup A for UB care; SUP-CGA  for LB care, requires cues for self efficacy/thoroughness of care (seems baseline vs cog); needs increased time. Barriers: Decreased standing tolerance/balance and decreased activity tolerance.   Overall Mod I   Activity tolerance, general conditioning, caregiver education, and discharge planning    Mobility   supervision bed mobility, trasnfers, CGA gait with RW x175', CGA/minA 4 6 steps   Supervision  family ed, endurance training, strengthening, DC prep    Communication                Safety/Cognition/ Behavioral Observations               Pain   pt denies pain   to remain free from pain   assessment every shift    Skin   Bilateral rash on abdomen ,Open Surgical Incision Perineum Left   Fournier's,  right toe  abrasion   Incision will heal during stay  on rehab; patient will learn how do own incision care  daily dressing change, assessment of the wound q shift      Discharge Planning:  Pt will d/c to home with his wife who is primary caregiver. She reports there is PRN support from two out of three of their children. SW will confirm there are no barriers to discharge.    Team Discussion: Patient was admitted post debility due to subacute TBI with SDH s/p fall. Patient with elevated blood sugars, elevated blood pressure , dizziness , pain :  medication and treatments adjusted by MD. Patient on dialysis. Patient progress limited by poor endurance, perseveration and  wound care.  Patient on target to meet rehab goals: yes, currently patient needs supervision - CGA with ADLs and transfers. Patient was able to ambulate up to 175' CGA with min assistance using a RW. Overall hgoals at discharge are set for supervision- mod I assistance.    *See Care Plan and progress notes for long and short-term goals.   Revisions to Treatment Plan:  Scopolamine  patches Vestibular consult Gait belt CPAP  Teaching Needs: Safety, medications, transfers, toileting, wound care, etc   Current Barriers to Discharge: Decreased caregiver support, Home enviroment access/layout, Wound care, and Hemodialysis  Possible Resolutions to Barriers: Family Education Outpatient follow up [facility] for vestibular therapy  Medical Summary Current Status: Medically complicated by complex wounds with recent Fournier's gangrene, ESRD on HD, chronic hyponatremia, anemia, TBI with SDH and cognitive deficits, hypertension,uncontrolled type 2 diabetes, and MSSA bacteremia on IV antibiotics  Barriers to Discharge: Behavior/Mood;Electrolyte abnormality;Medical stability;Infection/IV Antibiotics;Uncontrolled Pain;Uncontrolled Hypertension;Uncontrolled Diabetes;Symptomatic Anemia;Self-care education;Renal Insufficiency/Failure   Possible Resolutions to [employer]: Titrate  diabetes regimen to control blood sugars, monitor vitals and volume status with dialysis, titrate BP medications, complex wound management   Continued Need for Acute Rehabilitation Level of Care: The patient requires daily medical management by a physician with specialized training in physical medicine and rehabilitation for the following reasons: Direction of a multidisciplinary physical rehabilitation program to maximize functional independence : Yes Medical management of patient stability for increased activity during participation in an intensive rehabilitation regime.: Yes Analysis of laboratory values and/or radiology reports with any subsequent need for medication adjustment and/or medical intervention. : Yes   I attest that I was present, lead the team conference, and concur with the assessment and plan of the team.   Raschelle Wisenbaker Gayo 12/01/2024, 1020 am

## 2024-12-01 NOTE — Progress Notes (Signed)
 Patient ID: Brendan [name]., male   DOB: 02-Mar-1956, 69 y.o.   MRN: [id number]  SW spoke with pt wife to provide updates from team conference, d.c date now 1/17 and discussed rescheduling family edu. Will now be on Thursday 2pm-4pm as pt will switch over to MWF for dialysis days.   Graeme Jude, MSW, LCSW Office: [phone] Cell: [phone] Fax: [phone]

## 2024-12-02 LAB — BASIC METABOLIC PANEL WITH GFR
Anion gap: 13 (ref 5–15)
BUN: 51 mg/dL — ABNORMAL HIGH (ref 8–23)
CO2: 26 mmol/L (ref 22–32)
Calcium: 8.5 mg/dL — ABNORMAL LOW (ref 8.9–10.3)
Chloride: 98 mmol/L (ref 98–111)
Creatinine, Ser: 8.7 mg/dL — ABNORMAL HIGH (ref 0.61–1.24)
GFR, Estimated: 6 mL/min — ABNORMAL LOW
Glucose, Bld: 86 mg/dL (ref 70–99)
Potassium: 5.1 mmol/L (ref 3.5–5.1)
Sodium: 136 mmol/L (ref 135–145)

## 2024-12-02 LAB — GLUCOSE, CAPILLARY
Glucose-Capillary: 67 mg/dL — ABNORMAL LOW (ref 70–99)
Glucose-Capillary: 88 mg/dL (ref 70–99)
Glucose-Capillary: 274 mg/dL — ABNORMAL HIGH (ref 70–99)
Glucose-Capillary: 142 mg/dL — ABNORMAL HIGH (ref 70–99)
Glucose-Capillary: 166 mg/dL — ABNORMAL HIGH (ref 70–99)

## 2024-12-02 MED ORDER — INSULIN GLARGINE 100 UNIT/ML ~~LOC~~ SOLN
30.0000 [IU] | Freq: Every day | SUBCUTANEOUS | Status: DC
  Administered 2024-12-02 – 2024-12-05 (×4): 30 [IU] via SUBCUTANEOUS
  Filled 2024-12-02 (×4): qty 0.3

## 2024-12-02 MED ORDER — LIDOCAINE 5 % EX PTCH
2.0000 | MEDICATED_PATCH | Freq: Two times a day (BID) | CUTANEOUS | Status: DC
  Administered 2024-12-02 – 2024-12-05 (×6): 2 via TRANSDERMAL
  Filled 2024-12-02 (×6): qty 2

## 2024-12-02 MED ORDER — HEPARIN SODIUM (PORCINE) 1000 UNIT/ML IJ SOLN
INTRAMUSCULAR | Status: AC
  Filled 2024-12-02: qty 3

## 2024-12-02 MED ORDER — HEPARIN SODIUM (PORCINE) 1000 UNIT/ML IJ SOLN
INTRAMUSCULAR | Status: AC
  Filled 2024-12-02: qty 4

## 2024-12-02 NOTE — Progress Notes (Signed)
 Occupational Therapy Discharge Summary  Patient Details  Name: Brendan [name]. MRN: [id number] Date of Birth: Mar 29, 1956  Date of Discharge from OT service:December 04, 2024   Patient has met 10 of 10 long term goals due to improved activity tolerance, improved balance, postural control, ability to compensate for deficits, and improved coordination.  Patient to discharge at overall Modified Independent level.  Patient's care partner is independent to provide the necessary physical assistance at discharge.  He continues to be limited by a L unilateral vestibulopathy that impacts dizziness with movement and oscillopsia.This is baseline but worsened with TBI. An adaptation based vestibular rehab program is being initiated and pt would continue to benefit from outpatient vestibular rehab at d/c.   Reasons goals not met: All goals met.   Recommendation:  Patient will benefit from ongoing skilled OT services in outpatient setting to continue to advance functional skills in the area of BADL and iADL.  Equipment: No equipment provided  Reasons for discharge: treatment goals met and discharge from hospital  Patient/family agrees with progress made and goals achieved: Yes  OT Discharge Precautions/Restrictions  Precautions Precautions: Fall Precaution/Restrictions Comments: L vestibulopathy with oscillopsia Restrictions Weight Bearing Restrictions Per Provider Order: No ADL ADL Equipment Provided: Reacher Eating: Independent Where Assessed-Eating: Chair Grooming: Modified independent Where Assessed-Grooming: Sitting at sink Upper Body Bathing: Modified independent Where Assessed-Upper Body Bathing: Sitting at sink Lower Body Bathing: Modified independent Where Assessed-Lower Body Bathing: Sitting at sink Upper Body Dressing: Modified independent (Device) Where Assessed-Upper Body Dressing: Sitting at sink Lower Body Dressing: Modified independent Where Assessed-Lower Body Dressing:  Sitting at sink Toileting: Modified independent Where Assessed-Toileting: Bedside Commode Toilet Transfer: Modified independent Toilet Transfer Method: Stand pivot Acupuncturist: Grab bars Tub/Shower Transfer: Unable to assess Film/video Editor: Distant supervision Film/video Editor Method: Warden/ranger: Information systems manager with back Vision Baseline Vision/History: 0 No visual deficits Patient Visual Report: Blurring of vision Vision Assessment?: Yes Eye Alignment: Within Functional Limits Ocular Range of Motion: Within Functional Limits Alignment/Gaze Preference: Within Defined Limits Tracking/Visual Pursuits: Other (comment) (Right beating nystagmus with R gaze) Saccades: Overshoots Convergence: Within functional limits Additional Comments: Oscillopsia 2/2 L vestibulopathy Perception  Perception: Within Functional Limits Praxis Praxis: WFL Cognition Cognition Overall Cognitive Status: Within Functional Limits for tasks assessed Arousal/Alertness: Awake/alert Awareness: Appears intact Problem Solving: Appears intact [place] [employer] Scales of Cognitive Functioning: Purposeful, Appropriate: Modified Independent Brief Interview for Mental Status (BIMS) Repetition of Three Words (First Attempt): 3 Temporal Orientation: Year: Correct Temporal Orientation: Month: Accurate within 5 days Temporal Orientation: Day: Correct Recall: Sock: Yes, no cue required Recall: Blue: Yes, no cue required Recall: Bed: Yes, no cue required BIMS Summary Score: 15 Sensation Sensation Light Touch: Impaired Detail Additional Comments: Fine touch mildly impaired; tingling but does not report prior dx of neuropathy at hands. reported neuropathy at feet Coordination Gross Motor Movements are Fluid and Coordinated: Yes Fine Motor Movements are Fluid and Coordinated: Yes Motor  Motor Motor: Within Functional Limits Motor - Discharge Observations:  generalized weakness, much improved Mobility  Bed Mobility Rolling Right: Independent Supine to Sit: Independent with assistive device Sit to Supine: Independent with assistive device Transfers Sit to Stand: Independent with assistive device Stand to Sit: Independent with assistive device  Trunk/Postural Assessment  Cervical Assessment Cervical Assessment: Within Functional Limits Thoracic Assessment Thoracic Assessment: Within Functional Limits Lumbar Assessment Lumbar Assessment: Within Functional Limits Postural Control Postural Control: Deficits on evaluation Righting Reactions: delayed  Balance Balance Balance  Assessed: Yes Static Sitting Balance Static Sitting - Balance Support: Feet supported Static Sitting - Level of Assistance: 6: Modified independent (Device/Increase time) Dynamic Sitting Balance Dynamic Sitting - Balance Support: Feet supported Dynamic Sitting - Level of Assistance: 6: Modified independent (Device/Increase time) Static Standing Balance Static Standing - Balance Support: During functional activity;Bilateral upper extremity supported Static Standing - Level of Assistance: 6: Modified independent (Device/Increase time) Dynamic Standing Balance Dynamic Standing - Balance Support: During functional activity;Bilateral upper extremity supported Dynamic Standing - Level of Assistance: 6: Modified independent (Device/Increase time) Extremity/Trunk Assessment RUE Assessment RUE Assessment: Within Functional Limits LUE Assessment LUE Assessment: Within Functional Limits   Nena VEAR Moats 12/02/2024, 11:35 AM

## 2024-12-02 NOTE — Progress Notes (Signed)
 " [facility] Progress Note   Subjective:    Seen and examined patient while he's working with PT. He remains on RA and denies SOB, CP, and N/V. Current K+ 5.1. Plan for HD this afternoon.  Objective Vitals:   12/01/24 1744 12/01/24 1932 12/02/24 0536 12/02/24 1100  BP:  130/64 132/60   Pulse: 62 61 (!) 58   Resp:  18 18   Temp:  98.4 F (36.9 C) 98.5 F (36.9 C)   TempSrc:      SpO2:  100% 100%   Weight:    124 kg  Height:       Physical Exam General: Awake, alert, on RA, NAD Heart: S1 and S2; No murmurs, gallops, or rubs Lungs: Clear throughout Abdomen: Soft and non-tender Extremities:Trace edema b/l LEs Dialysis Access: [facility]   Filed Weights   11/30/24 0535 12/01/24 0500 12/02/24 1100  Weight: 123.2 kg 123.5 kg 124 kg    Intake/Output Summary (Last 24 hours) at 12/02/2024 1117 Last data filed at 12/02/2024 0758 Gross per 24 hour  Intake 777 ml  Output 50 ml  Net 727 ml    Additional Objective Labs: Basic Metabolic Panel: Recent Labs  Lab 11/28/24 0601 12/02/24 0451  NA 130* 136  K 4.3 5.1  CL 93* 98  CO2 28 26  GLUCOSE 194* 86  BUN 32* 51*  CREATININE 5.96* 8.70*  CALCIUM 8.4* 8.5*   Liver Function Tests: No results for input(s): AST, ALT, ALKPHOS, BILITOT, PROT, ALBUMIN  in the last 168 hours. No results for input(s): LIPASE, AMYLASE in the last 168 hours. CBC: Recent Labs  Lab 11/28/24 0601 12/01/24 1948  WBC 13.1* 12.2*  NEUTROABS 9.2* 8.3*  HGB 7.9* 8.0*  HCT 23.8* 24.0*  MCV 82.1 81.9  PLT 308 330   Blood Culture    Component Value Date/Time   SDES BLOOD RIGHT HAND 11/13/2024 0729   SDES BLOOD LEFT ARM 11/13/2024 0729   SPECREQUEST  11/13/2024 0729    BOTTLES DRAWN AEROBIC AND ANAEROBIC Blood Culture results may not be optimal due to an inadequate volume of blood received in culture bottles   SPECREQUEST  11/13/2024 0729    BOTTLES DRAWN AEROBIC AND ANAEROBIC Blood Culture results may not be optimal due  to an inadequate volume of blood received in culture bottles   CULT  11/13/2024 0729    NO GROWTH 5 DAYS Performed at [facility], [address], [place], [place] [address]    CULT  11/13/2024 0729    NO GROWTH 5 DAYS Performed at [facility], [address] [place], [place] [address]    REPTSTATUS 11/18/2024 FINAL 11/13/2024 0729   REPTSTATUS 11/18/2024 FINAL 11/13/2024 0729    Cardiac Enzymes: No results for input(s): CKTOTAL, CKMB, CKMBINDEX, TROPONINI in the last 168 hours. CBG: Recent Labs  Lab 12/01/24 1204 12/01/24 1634 12/01/24 2259 12/02/24 0636 12/02/24 0704  GLUCAP 130* 98 125* 67* 88   Iron  Studies: No results for input(s): IRON , TIBC, TRANSFERRIN, FERRITIN in the last 72 hours. Lab Results  Component Value Date   INR 1.0 08/28/2024   Studies/Results: No results found.  Medications:   ceFAZolin  (ANCEF ) IV 2 g (12/01/24 1800)    amLODipine   5 mg Oral Daily   calcitRIOL   0.25 mcg Oral Daily   carvedilol   6.25 mg Oral BID WC   Chlorhexidine  Gluconate Cloth  6 each Topical BID   Chlorhexidine  Gluconate Cloth  6 each Topical Q0600   darbepoetin (  ARANESP ) injection - DIALYSIS  100 mcg Subcutaneous Q Tue-1800   diclofenac  Sodium  2 g Topical QID   dorzolamide   1 drop Both Eyes Daily   heparin   5,000 Units Subcutaneous Q8H   hydrocerin   Topical BID   insulin  aspart  0-5 Units Subcutaneous QHS   insulin  aspart  0-6 Units Subcutaneous TID WC   insulin  aspart  8 Units Subcutaneous TID WC   insulin  glargine  30 Units Subcutaneous Daily   levothyroxine   125 mcg Oral q AM   lidocaine   2 patch Transdermal Q12H   scopolamine   1 patch Transdermal Q72H   senna-docusate  1 tablet Oral BID   simvastatin   20 mg Oral QPM   timolol   1 drop Both Eyes Daily    Dialysis Orders: ESRD MTuThF (note that AKI has progressed to ESRD) EDW 125kg - he has been at [facility]/p line holiday 12/26 to 12/30 - HD per TTS schedule here    Assessment/Plan: 1. Sepsis 2/2 MSSA and proteus bacteremia - suspected TDC infection s/p line holiday.  Replaced on 11/17/24. WBC finally trending down. Per ID needs IV cefazolin  w/ HD x 4 weeks thru 12/11/2024.  completed Levaquin  course during admission on 11/29/24.    2. ESRD - previously considered AKI, now deemed ESRD.  Last HD 1/10. High patient census on the HD unit. Vo,ume and labs stable and remains on RA. CLIP: [facility] MWF. Next HD today to transition to MWF schedule 3. Anemia of CKD- Hgb 8.0. On ESA - aranesp  100mcg qSat.  No iron  with recent infection/IV antibiotics.  4. Secondary hyperparathyroidism - Ca and phos in goal. Not on binders. Continue VDRA.  5. HTN/volume - Continue coreg  6.25mg  BID and amlodipine  2.5mg  at bedtime.  LE edema on exam. Max UF as tolerated. Will need lower EDW on d/c.   6. Nutrition - Renal diet w/fluid restrictions.  7. Recent Traumatic brain injury 2/2 to fall - in CIR.  Charmaine Piety, NP [facility] 12/02/2024,11:17 AM  LOS: 9 days    "

## 2024-12-02 NOTE — Progress Notes (Signed)
 Pt blood glucose was 67 at 0636. 15 grams of carbs was given per protocol. Rechecked pt blood glucose and it is now 88. Pt is eating his breakfast. On call provider notified and aware.

## 2024-12-02 NOTE — Progress Notes (Addendum)
 Attempted to give schedule letter at bedside to pt, he was not in his room. Will come back late today.  Zamyra Allensworth Dialysis navigator [phone]  Addendum 2:42 Schedule letter provided to pt at this time. As a reminder, pt will be going on 1/19 to [facility] for his 1st HD appointment at 12:05, he will then continue every MWF at 12:25. Pt agreeable to this. Pt requesting for me to call wife, however, no number on demo listed is working/answered. Will continue to try to update wife, AVS already updated.

## 2024-12-02 NOTE — Progress Notes (Signed)
" °   12/02/24 1659  Vitals  BP (!) 148/65  Pulse Rate 63  Resp 19  Oxygen Therapy  SpO2 100 %  O2 Device Room Air  Patient Activity (if Appropriate) In bed  Pulse Oximetry Type Continuous  Oximetry Probe Site Changed No  During Treatment Monitoring  Blood Flow Rate (mL/min) 400 mL/min  Arterial Pressure (mmHg) -240.8 mmHg  Venous Pressure (mmHg) 190.09 mmHg  TMP (mmHg) 12.73 mmHg  Ultrafiltration Rate (mL/min) 965 mL/min  Dialysate Flow Rate (mL/min) 300 ml/min  Dialysate Potassium Concentration 2  Dialysate Calcium Concentration 2.5  Duration of HD Treatment -hour(s) 3.47 hour(s)  Cumulative Fluid Removed (mL) per Treatment  2467.29  HD Safety Checks Performed Yes  Intra-Hemodialysis Comments Tolerated well;Tx completed  Hemodialysis Catheter Left Internal jugular Double lumen Permanent (Tunneled)  Placement Date/Time: 11/17/24 1416   Serial / Lot #: [id number]  Expiration Date: 07/24/27  Time Out: Correct patient;Correct site;Correct procedure  Maximum sterile barrier precautions: Hand hygiene;Sterile probe cover;Cap;Mask;Sterile gown;Sterile g...  Site Condition No complications  Blue Lumen Status Heparin  locked;Antimicrobial dead end cap  Red Lumen Status Heparin  locked;Antimicrobial dead end cap  Catheter fill solution Heparin  1000 units/ml  Catheter fill volume (Arterial) 23 cc  Catheter fill volume (Venous) 2.3  Dressing Type Transparent  Dressing Status Antimicrobial disc/dressing in place;Clean, Dry, Intact  Interventions New dressing;Dressing changed;Antimicrobial disc changed  Drainage Description None  Dressing Change Due 12/09/24  Post treatment catheter status Capped and Clamped    "

## 2024-12-02 NOTE — Progress Notes (Signed)
 Physical Therapy Weekly Progress Note  Patient Details  Name: Brendan [name]. MRN: [id number] Date of Birth: [birth year]  Beginning of progress report period: November 25, 2023 End of progress report period: December 02, 2023  Today's Date: 12/02/2024 PT Individual Time: 9194-9084 PT Individual Time Calculation (min): 70 min   Today's Date: 12/02/2024 PT Individual Time: 1118-1200 PT Individual Time Calculation (min): 42 min   Patient has met 0 of 3 short term goals. Pt is progressing very well toward mobility goals, improving independence with bed mobility, transfers, ambulation, and endurance. Pt is ambulating up to 175' with RW and CGA, performs transfers consistently with CGA or supervision, and up to x4 steps with minA and bilateral handrails. Pt will benefit from hands on family education prior to discharge.   Patient continues to demonstrate the following deficits muscle weakness, decreased cardiorespiratoy endurance, and decreased standing balance, decreased postural control, and decreased balance strategies and therefore will continue to benefit from skilled PT intervention to increase functional independence with mobility.  Patient progressing toward long term goals..  Continue plan of care.  PT Short Term Goals Week 1:  PT Short Term Goal 1 (Week 1): Pt will complete transfer with supervision with LRAD PT Short Term Goal 1 - Progress (Week 1): Met PT Short Term Goal 2 (Week 1): Pt will ambulate 100' with LRAD and CGA PT Short Term Goal 2 - Progress (Week 1): Met PT Short Term Goal 3 (Week 1): Pt will complete up/down 4 steps with min assist and BHRs PT Short Term Goal 3 - Progress (Week 1): Met Week 2:  PT Short Term Goal 1 (Week 2): STGs = LTGs  Skilled Therapeutic Interventions/Progress Updates:  Discharge planning;Ambulation/gait training;DME/adaptive equipment instruction;Community reintegration;Neuromuscular re-education;Stair training;Psychosocial support;UE/LE Strength  taining/ROM;Wheelchair propulsion/positioning;Balance/vestibular training;Pain management;Therapeutic Activities;UE/LE Coordination activities;Functional mobility training;Disease management/prevention;Patient/family education;Splinting/orthotics;Therapeutic Exercise   1st Session: Pt received seated at EOB and agrees to therapy. Reports pain in back, unrated. PT provides rest breaks and gentle mobility to manage pain. Pt performs stand step transfer to [facility] with RW and cues for positioning. WC transport to gym for time management. Pt performs bilateral LAQs to warm up knee extensors for mobility. Pt performs sit to stand and ambulates x175' with RW and cues to decrease WB through RW for energy conservation and body mechanics. Pt reports feeling wore out following ambulation. Seated rest break.   Pt performs 2x5 reps sit to stand with RW and cues for hand placement and sequencing, with emphasis on optimal body mechanics and motor planning.  Pt ambulates additional x175' with RW and same cues. During rest break, PT adds 5lb ankle weights to each leg. Pt stands and ambulates x70' with RW and ankle weights to promote increased balance demand and strengthening. Following, pt performs alternating foot taps on 6 step with ankle weights in place to strengthen hip flexors. Pt completes 2x12 with weights and 1x12 without weights. WC transport back to room. Left seated with all needs within reach.    2nd Session: Pt received seated in [facility] and agrees to therapy. Reports some pain in back, unrated. Pt requests to work arm as legs are feeling very fatigued. PT encourages Nustep activity for both upper and lower body endurance training and pt is agreeable. WC transport to gym. Pt performs stand step to Nustep without AD, requiring minA for stability. Pt completes Nustep at workload of 5 with average steps per minute ~65. PT provides cues for hand and foot placement and completing full available ROM. Pt  completes 3x10:00  with seated rest breaks. Pt performs stand step back to [facility] with CGA and cues for positioning. Left seated with all needs within reach.  Therapy Documentation Precautions:  Precautions Precautions: Fall Recall of Precautions/Restrictions: Intact Precaution/Restrictions Comments: watch BP, HD fluid restriction Restrictions Weight Bearing Restrictions Per Provider Order: No Other Position/Activity Restrictions: increased dizziness with transitions supine-sit-stand; not orthostatic on eval.  Therapy/Group: Individual Therapy  Elsie JAYSON Dawn, PT, DPT 12/02/2024, 3:49 PM

## 2024-12-02 NOTE — Progress Notes (Addendum)
 Occupational Therapy Session Note  Patient Details  Name: Brendan [name]. MRN: [id number] Date of Birth: Dec 26, 1955  Today's Date: 12/02/2024 OT Individual Time: 9044-8894 OT Individual Time Calculation (min): 70 min    Short Term Goals: Week 2:  OT Short Term Goal 1 (Week 2): STG= LTG d/t ELOS  Skilled Therapeutic Interventions/Progress Updates:    Pt received sitting up with no c/o pain, agreeable to OT session. He completed oral care and grooming tasks seated at the sink with set up assist. Pt was taken via w/c to the therapy gym for time management. Completed the Dynamic Visual Assessment- pt with 10/20 dynamic vs 10/10 static, further indicating VOR dysfunction. Completed multiple VOR training exercises on the BITS, challenging head turn at 100 bpm to focus on adaptation for improved oscillopsia during dynamic head movements during ADLs and mobility. He scored between 80-90 % accuracy on multiple versions, graded up and down for max challenge. He then completed 100 ft of functional mobility to the therapy gym with the RW at (S) level, to increase endurance and dynamic balance needed during ADLs. Pt completed blocked practice sit <> stand, 2x4 repetitions with no UE support, to challenge generalized strengthening for ADL transfers, as well as increasing functional activity tolerance and cardiorespiratory endurance. Pt required CGA with the seat highly elevated. He ended with 2x8 modified sit ups to challenge core strengthening and stability needed for reduced fall risk during ADL transfers. Hands on facilitation provided with encouragement. He returned to his room following via 100 ft of functional mobility with the RW, CGA. Pt was left sitting up in the wheelchair with all needs met, chair alarm set, and call bell within reach.    Therapy Documentation Precautions:  Precautions Precautions: Fall Recall of Precautions/Restrictions: Intact Precaution/Restrictions Comments: watch BP, HD fluid  restriction Restrictions Weight Bearing Restrictions Per Provider Order: No Other Position/Activity Restrictions: increased dizziness with transitions supine-sit-stand; not orthostatic on eval.  Therapy/Group: Individual Therapy  Nena VEAR Moats 12/02/2024, 10:24 AM

## 2024-12-02 NOTE — Progress Notes (Signed)
 "                                                        PROGRESS NOTE   Subjective/Complaints:  No events overnight.  Blood sugars low in the 60s this a.m., increased to 88 with glucose.  Other vitals are stable. Did very well with Lidoderm  patch overnight, but states was taken off this morning his pain resumed.  ROS: Patient denies fever, rash, sore throat, blurred vision,   nausea, vomiting, diarrhea, cough, shortness of breath or chest pain,  headache, or mood change.   + Back pain  Objective:   No results found. Recent Labs    12/01/24 1948  WBC 12.2*  HGB 8.0*  HCT 24.0*  PLT 330    Recent Labs    12/02/24 0451  NA 136  K 5.1  CL 98  CO2 26  GLUCOSE 86  BUN 51*  CREATININE 8.70*  CALCIUM 8.5*     Intake/Output Summary (Last 24 hours) at 12/02/2024 0743 Last data filed at 12/01/2024 1856 Gross per 24 hour  Intake 120 ml  Output 50 ml  Net 70 ml        Physical Exam: Vital Signs Blood pressure 132/60, pulse (!) 58, temperature 98.5 F (36.9 C), resp. rate 18, height 6' 2 (1.88 m), weight 123.5 kg, SpO2 100%. Constitutional: No distress . Vital signs reviewed.  Sitting upright in therapy gym. HEENT: NCAT, EOMI, oral membranes moist.   Neck: supple Cardiovascular: RRR without murmur. No JVD    Respiratory/Chest: CTA Bilaterally without wheezes or rales. Normal effort    GI/Abdomen: BS +, non-tender, non-distended Ext: no clubbing, cyanosis, or edema Psych: pleasant and cooperative  Skin:  Psoriasis, flaky skin bilateral palms and feet. --Improved Callus under right great toe.  Tunneled dialysis cath left internal jugular.  Perineal wound -covered L 1st toe wound - is covered   MSK:  No apparent deformity.  + TTP right mid thoracic to upper lumbar's paraspinals, with some palpable tightness.  Neurologic exam:  Alert and oriented x 3.    Reasonable insight and awareness.. Mild memory deficits Normal speech and language. Cranial nerves II  through XII intact 4/5 strength distal and proximal bilateral upper and lower extremities. Peripheral neuropathy in bilateral feet distal to the ankle worse on the left No tone Reflexes intact No ataxia or nystagmus appreciated this am  Physical exam unchanged from the above on reexamination 12/02/2024    Assessment/Plan: 1. Functional deficits which require 3+ hours per day of interdisciplinary therapy in a comprehensive inpatient rehab setting. Physiatrist is providing close team supervision and 24 hour management of active medical problems listed below. Physiatrist and rehab team continue to assess barriers to discharge/monitor patient progress toward functional and medical goals  Care Tool:  Bathing    Body parts bathed by patient: Right arm, Left arm, Chest, Abdomen, Front perineal area, Buttocks, Right upper leg, Left upper leg, Right lower leg, Left lower leg, Face   Body parts bathed by helper: Left lower leg, Right lower leg, Buttocks     Bathing assist Assist Level: Supervision/Verbal cueing     Upper Body Dressing/Undressing Upper body dressing   What is the patient wearing?: Pull over shirt    Upper body assist Assist Level: Set up assist    Lower  Body Dressing/Undressing Lower body dressing      What is the patient wearing?: Pants, Underwear/pull up     Lower body assist Assist for lower body dressing: Supervision/Verbal cueing     Toileting Toileting    Toileting assist Assist for toileting: Moderate Assistance - Patient 50 - 74%     Transfers Chair/bed transfer  Transfers assist     Chair/bed transfer assist level: Contact Guard/Touching assist     Locomotion Ambulation   Ambulation assist      Assist level: Minimal Assistance - Patient > 75% Assistive device: Walker-rolling Max distance: 24'   Walk 10 feet activity   Assist     Assist level: Minimal Assistance - Patient > 75% Assistive device: Walker-rolling   Walk 50 feet  activity   Assist Walk 50 feet with 2 turns activity did not occur: Safety/medical concerns         Walk 150 feet activity   Assist Walk 150 feet activity did not occur: Safety/medical concerns         Walk 10 feet on uneven surface  activity   Assist Walk 10 feet on uneven surfaces activity did not occur: Safety/medical concerns         Wheelchair     Assist Is the patient using a wheelchair?: Yes Type of Wheelchair: Manual    Wheelchair assist level: Dependent - Patient 0%      Wheelchair 50 feet with 2 turns activity    Assist        Assist Level: Dependent - Patient 0%   Wheelchair 150 feet activity     Assist      Assist Level: Dependent - Patient 0%   Blood pressure 132/60, pulse (!) 58, temperature 98.5 F (36.9 C), resp. rate 18, height 6' 2 (1.88 m), weight 123.5 kg, SpO2 100%.   Medical Problem List and Plan: 1. Functional deficits secondary to debility due subacute TBI with SDH s/p fall             -patient may  shower             -ELOS/Goals: 10-12 days, supervision to mod I goals - 1/17 DC   -Continue CIR therapies including PT, OT, and SLP    - 1/13: Perseverates on weakness but walks 175 ft with walker CGA, CGA to Min A with stairs and sit to stand.   2.  Antithrombotics: -DVT/anticoagulation:  Pharmaceutical: Heparin              -antiplatelet therapy: N/A  3. Pain Management: Tylenol  prn.   1-8: Tension headache; add Voltaren  gel 4 times daily and as needed lidocaine  patches to posterior neck--symptoms improved 1/11  1-12: Dr. Nursing to place lidocaine  patches on mid upper back for chronic pain--scheduled 2 patches along with intermittent heat 1-13  - 1-14: Increase Lidoderm  patch to every 12 hours to cover day and nighttime  4. Mood/Behavior/Sleep: LCSW to follow for evaluation and support.              -antipsychotic agents: N/A              1/6: Add as needed melatonin 5 mg at bedtime   - Sleeping better using  CPAP, minimized interruptions  5. Neuropsych/cognition: This patient may be capable of making decisions on his own behalf.   - 1/6: Cog eval limiting - patient with limited participation and dizziness - SLP not noting true cog deficits so not picking up for ongoing therapies  1-11--sleep improved which helps his arousal  6. Skin/Wound Care: Routine pressure relief measures             -eucerin cream to bilateral hands and feet bid, pt states he's been dealing with it for several months. psoriasis-- follows with Dr. Tarfeen (dermatology) for the past 6 years    - R toe abrasion   - Wound care in place for scrotum/peroneal wound  7. Fluids/Electrolytes/Nutrition: Strict I/O. Labs with HD   - AM labs with stable hyponatremia; monitor w/ dialysis  8. MSSA/ Proteus mirabilis bacteremia/monocytosis: cefazolin  (changed from Cefepime  on 1/3 by ID) with HD till 1/9 to complete 2 weeks following line removal   - Levaquin  300 mg Q48H added on 01/04 for additional week  - 1/8: Per Dr Overton, patient with persistent monocytosis, would not change regimen unless s/s infection -   need hematology outpatient f/u on discharge -1/10 wbc's much improved today 13k 1-13:  Finished linezolid ; cont IV cefazolin  with HD for 4 weeks through 12-11-2024.  Repeat CBC with dialysis today  9.  T2DM: Hgb A1C- 8.4%. Monitor BS ac/hs and use SSI for elevated BS             --continue Lantus  increased to 25 units on 01/05 and on 5 units TID for meal coverage.              1/7: Eating 25 to 80% of meals.  Cautious to overdo meal coverage given variability.  Increase Lantus  to 30 units daily.  1-8: Blood sugars looking better, monitor today  1-9: Increase Semglee  to 35 units; if further increases, would split to twice daily  1/12: Increase Premeal to 8 units-- improved    1/14: Hypoglycemic yesterday PM and this AM - reduce semglee  to 30 U; intakes remain 100% Recent Labs    12/01/24 2259 12/02/24 0636 12/02/24 0704  GLUCAP  125* 67* 88      10. HTN: BP has been on low side likely due to sepsis.  --On coreg  6.25 mg bid. Continue to hold Norvasc .  -1-6 stable--monitor closely and get orthostatics in a.m. 1/7: Mildly hypertensive this a.m.  Variability with dialysis, will add as needed hydralazine  for SBP greater than 170 and monitor 1/8: resume norvasc  at low dose 2.5 mg daily. Continue to hold torsemide  - 1-13: Increase Norvasc  to 5 mg daily--improved    12/02/2024    5:36 AM 12/01/2024    7:32 PM 12/01/2024    5:44 PM  Vitals with BMI  Systolic 132 130   Diastolic 60 64   Pulse 58 61 62    11. Fournier's gangrene s/p I & D 08/2024: Continue to pack wound bid.   12. Anemia of chronic disease: On weekly aranesp . Continue to monitor H/H. - Labs with dialysis  13.  OSA: Continue CPAP  14. Constipation: On Miralax  and senna but refusing second dose-->will simplify regimen.   - LBM 1/11, x 2  15. ESRD. Dialysis T TH Sat.   - Makes small amount of urine, incontinent at baseline  16. Vertigo/Dizziness -likely central vertigo but may be complicated by orthostasis -- Reports occipital HA, blurry vision and dizziness once up-vestibular eval pending - 1/6 Add PRN meclizine  12.5 mg TID, scopolamine  patch - 1-8: Worsening vertigo today, but appears scopolamine  patch fell off; ordered replacement--  - 1/13: Left posteriorlateral vestibulopathy on eval, c/b neck and back pain. Set up with [facility] for vestibular therapy - on Hx is chronic for several years + worsening  with recent SDH  LOS: 9 days A FACE TO FACE EVALUATION WAS PERFORMED  Joesph JAYSON Likes 12/02/2024, 7:43 AM     "

## 2024-12-03 LAB — GLUCOSE, CAPILLARY
Glucose-Capillary: 47 mg/dL — ABNORMAL LOW (ref 70–99)
Glucose-Capillary: 63 mg/dL — ABNORMAL LOW (ref 70–99)
Glucose-Capillary: 91 mg/dL (ref 70–99)
Glucose-Capillary: 189 mg/dL — ABNORMAL HIGH (ref 70–99)
Glucose-Capillary: 156 mg/dL — ABNORMAL HIGH (ref 70–99)
Glucose-Capillary: 249 mg/dL — ABNORMAL HIGH (ref 70–99)

## 2024-12-03 MED ORDER — DICLOFENAC SODIUM 1 % EX GEL
4.0000 g | Freq: Four times a day (QID) | CUTANEOUS | Status: DC
  Administered 2024-12-03 – 2024-12-05 (×7): 4 g via TOPICAL
  Filled 2024-12-03: qty 100

## 2024-12-03 MED ORDER — INSULIN ASPART 100 UNIT/ML IJ SOLN
4.0000 [IU] | Freq: Every day | INTRAMUSCULAR | Status: DC
  Filled 2024-12-03: qty 4

## 2024-12-03 MED ORDER — DARBEPOETIN ALFA 200 MCG/0.4ML IJ SOSY: 200.0000 ug | PREFILLED_SYRINGE | INTRAMUSCULAR | Status: DC

## 2024-12-03 MED ORDER — DARBEPOETIN ALFA 100 MCG/0.5ML IJ SOSY: 100.0000 ug | PREFILLED_SYRINGE | INTRAMUSCULAR | Status: DC

## 2024-12-03 MED ORDER — INSULIN ASPART 100 UNIT/ML IJ SOLN
8.0000 [IU] | Freq: Two times a day (BID) | INTRAMUSCULAR | Status: DC
  Administered 2024-12-03 – 2024-12-05 (×3): 8 [IU] via SUBCUTANEOUS
  Filled 2024-12-03 (×4): qty 8

## 2024-12-03 NOTE — Plan of Care (Signed)
" °  Problem: Consults Goal: RH GENERAL PATIENT EDUCATION Description: See Patient Education module for education specifics. Outcome: Progressing   Problem: RH BOWEL ELIMINATION Goal: RH STG MANAGE BOWEL WITH ASSISTANCE Description: STG Manage Bowel with mod I aupervision Assistance. Outcome: Progressing   Problem: RH SKIN INTEGRITY Goal: RH STG SKIN FREE OF INFECTION/BREAKDOWN Description: Manage skin free of infections with mod I - supervision assistance  Outcome: Progressing   Problem: RH SAFETY Goal: RH STG ADHERE TO SAFETY PRECAUTIONS W/ASSISTANCE/DEVICE Description: STG Adhere to Safety Precautions With mod I supervision Assistance/Device. Outcome: Progressing   Problem: RH PAIN MANAGEMENT Goal: RH STG PAIN MANAGED AT OR BELOW PT'S PAIN GOAL Description: <4 w/ prns Outcome: Progressing   Problem: RH KNOWLEDGE DEFICIT GENERAL Goal: RH STG INCREASE KNOWLEDGE OF SELF CARE AFTER HOSPITALIZATION Description: Manage increase knowledge of self care after hospitalization with mod I- supervision assistance from wife using educational materials provided Outcome: Progressing   "

## 2024-12-03 NOTE — Progress Notes (Signed)
" °  [facility] Progress Note    Assessment/ Plan:   1. Sepsis 2/2 MSSA and proteus bacteremia - suspected TDC infection s/p line holiday.  Replaced on 11/17/24. WBC finally trending down. Per ID needs IV cefazolin  w/ HD x 4 weeks thru 12/11/2024.  completed Levaquin  course during admission on 11/29/24.    2. ESRD - previously considered AKI, now deemed ESRD.  Last HD 1/10. High patient census on the HD unit. Vo,ume and labs stable and remains on RA. CLIP: [facility] MWF. HD on MWF schedule, HD tomorrow 3. Anemia of CKD- Hgb 8.0. On ESA - aranesp  100mcg qtues--changed to Monday and dose increased to 200mcg.  No iron  with recent infection/IV antibiotics.  4. Secondary hyperparathyroidism - Ca and phos in goal. Not on binders. Continue VDRA.  5. HTN/volume - Continue coreg  6.25mg  BID and amlodipine  2.5mg  at bedtime.  LE edema on exam. Max UF as tolerated. Will need lower EDW on d/c.   6. Nutrition - Renal diet w/fluid restrictions.  7. Recent Traumatic brain injury 2/2 to fall - in CIR.  Subjective:   Patient seen in room. No complaints. Tolerated HD yesterday with net UF 2.5L   Objective:   BP (!) 149/71   Pulse 79   Temp 98.1 F (36.7 C)   Resp 18   Ht 6' 2 (1.88 m)   Wt 125.7 kg   SpO2 100%   BMI 35.58 kg/m   Intake/Output Summary (Last 24 hours) at 12/03/2024 0914 Last data filed at 12/03/2024 9189 Gross per 24 hour  Intake 456 ml  Output 2500 ml  Net -2044 ml   Weight change:   Physical Exam: Gen: NAD CVS: RRR Resp: normal wob, unlabored Abd: soft Ext: 1+ pitting edema b/l Les Neuro: awake, alert Dialysis access: [facility]  Imaging: No results found.  Labs: BMET Recent Labs  Lab 11/28/24 0601 12/02/24 0451  NA 130* 136  K 4.3 5.1  CL 93* 98  CO2 28 26  GLUCOSE 194* 86  BUN 32* 51*  CREATININE 5.96* 8.70*  CALCIUM 8.4* 8.5*   CBC Recent Labs  Lab 11/28/24 0601 12/01/24 1948  WBC 13.1* 12.2*  NEUTROABS 9.2* 8.3*  HGB 7.9* 8.0*  HCT 23.8* 24.0*   MCV 82.1 81.9  PLT 308 330    Medications:     amLODipine   5 mg Oral Daily   calcitRIOL   0.25 mcg Oral Daily   carvedilol   6.25 mg Oral BID WC   Chlorhexidine  Gluconate Cloth  6 each Topical BID   Chlorhexidine  Gluconate Cloth  6 each Topical Q0600   darbepoetin (ARANESP ) injection - DIALYSIS  100 mcg Subcutaneous Q Tue-1800   diclofenac  Sodium  2 g Topical QID   dorzolamide   1 drop Both Eyes Daily   heparin   5,000 Units Subcutaneous Q8H   hydrocerin   Topical BID   insulin  aspart  0-5 Units Subcutaneous QHS   insulin  aspart  0-6 Units Subcutaneous TID WC   insulin  aspart  8 Units Subcutaneous TID WC   insulin  glargine  30 Units Subcutaneous Daily   levothyroxine   125 mcg Oral q AM   lidocaine   2 patch Transdermal Q12H   scopolamine   1 patch Transdermal Q72H   senna-docusate  1 tablet Oral BID   simvastatin   20 mg Oral QPM   timolol   1 drop Both Eyes Daily      Ephriam Stank, MD [facility] 12/03/2024, 9:14 AM    "

## 2024-12-03 NOTE — Progress Notes (Signed)
 "                                                        PROGRESS NOTE   Subjective/Complaints: No acute complaints.  Thinks back pain is overall improved with the lidocaine  patches, but reduced control with them today. A.m. blood sugar low again, 47, improved to 60s with p.o.  Is eating 100% of meals. Vitals with some ongoing hypertension, 140s systolic, diastolic normalized.  Other vital stable.   ROS: Patient denies fever, rash, sore throat, blurred vision,   nausea, vomiting, diarrhea, cough, shortness of breath or chest pain,  headache, or mood change.   + Back pain  Objective:   No results found. Recent Labs    12/01/24 1948  WBC 12.2*  HGB 8.0*  HCT 24.0*  PLT 330    Recent Labs    12/02/24 0451  NA 136  K 5.1  CL 98  CO2 26  GLUCOSE 86  BUN 51*  CREATININE 8.70*  CALCIUM 8.5*     Intake/Output Summary (Last 24 hours) at 12/03/2024 0956 Last data filed at 12/03/2024 0810 Gross per 24 hour  Intake 456 ml  Output 2500 ml  Net -2044 ml        Physical Exam: Vital Signs Blood pressure (!) 149/71, pulse 79, temperature 98.1 F (36.7 C), resp. rate 18, height 6' 2 (1.88 m), weight 125.7 kg, SpO2 100%. Constitutional: No distress . Vital signs reviewed.  Rolling wheelchair with therapies. HEENT: NCAT, EOMI, oral membranes moist.   Neck: supple Cardiovascular: RRR without murmur. No JVD    Respiratory/Chest: CTA Bilaterally without wheezes or rales. Normal effort    GI/Abdomen: BS +, non-tender, non-distended Ext: no clubbing, cyanosis, or edema Psych: pleasant and cooperative  Skin:  Psoriasis, flaky skin bilateral palms and feet--improving Callus under right great toe.  Tunneled dialysis cath left internal jugular.  Perineal wound -covered, photo 1-15 as below, healing well. L 1st toe wound - is covered   MSK:  No apparent deformity.  + TTP right mid thoracic to upper lumbar's paraspinals, with some palpable tightness--unchanged  Neurologic  exam:  Alert and oriented x 3.    Mild memory deficits, improving Good insight and awareness  normal speech and language. Cranial nerves II through XII intact 4+/5 strength distal and proximal bilateral upper and lower extremities. Peripheral neuropathy in bilateral feet distal to the ankle worse on the left  Assessment/Plan: 1. Functional deficits which require 3+ hours per day of interdisciplinary therapy in a comprehensive inpatient rehab setting. Physiatrist is providing close team supervision and 24 hour management of active medical problems listed below. Physiatrist and rehab team continue to assess barriers to discharge/monitor patient progress toward functional and medical goals  Care Tool:  Bathing    Body parts bathed by patient: Right arm, Left arm, Chest, Abdomen, Front perineal area, Buttocks, Right upper leg, Left upper leg, Right lower leg, Left lower leg, Face   Body parts bathed by helper: Left lower leg, Right lower leg, Buttocks     Bathing assist Assist Level: Supervision/Verbal cueing     Upper Body Dressing/Undressing Upper body dressing   What is the patient wearing?: Pull over shirt    Upper body assist Assist Level: Set up assist    Lower Body Dressing/Undressing Lower body dressing  What is the patient wearing?: Pants, Underwear/pull up     Lower body assist Assist for lower body dressing: Supervision/Verbal cueing     Toileting Toileting    Toileting assist Assist for toileting: Moderate Assistance - Patient 50 - 74%     Transfers Chair/bed transfer  Transfers assist     Chair/bed transfer assist level: Contact Guard/Touching assist     Locomotion Ambulation   Ambulation assist      Assist level: Minimal Assistance - Patient > 75% Assistive device: Walker-rolling Max distance: 24'   Walk 10 feet activity   Assist     Assist level: Minimal Assistance - Patient > 75% Assistive device: Walker-rolling   Walk 50  feet activity   Assist Walk 50 feet with 2 turns activity did not occur: Safety/medical concerns         Walk 150 feet activity   Assist Walk 150 feet activity did not occur: Safety/medical concerns         Walk 10 feet on uneven surface  activity   Assist Walk 10 feet on uneven surfaces activity did not occur: Safety/medical concerns         Wheelchair     Assist Is the patient using a wheelchair?: Yes Type of Wheelchair: Manual    Wheelchair assist level: Dependent - Patient 0%      Wheelchair 50 feet with 2 turns activity    Assist        Assist Level: Dependent - Patient 0%   Wheelchair 150 feet activity     Assist      Assist Level: Dependent - Patient 0%   Blood pressure (!) 149/71, pulse 79, temperature 98.1 F (36.7 C), resp. rate 18, height 6' 2 (1.88 m), weight 125.7 kg, SpO2 100%.   Medical Problem List and Plan: 1. Functional deficits secondary to debility due subacute TBI with SDH s/p fall             -patient may  shower             -ELOS/Goals: 10-12 days, supervision to mod I goals - 1/17 DC   -Continue CIR therapies including PT, OT, and SLP    - 1/13: Perseverates on weakness but walks 175 ft with walker CGA, CGA to Min A with stairs and sit to stand.   2.  Antithrombotics: -DVT/anticoagulation:  Pharmaceutical: Heparin              -antiplatelet therapy: N/A  3. Pain Management: Tylenol  prn.   1-8: Tension headache; add Voltaren  gel 4 times daily and as needed lidocaine  patches to posterior neck--symptoms improved 1/11  1-12: Dr. Nursing to place lidocaine  patches on mid upper back for chronic pain--scheduled 2 patches along with intermittent heat 1-13  - 1-14: Increase Lidoderm  patch to every 12 hours to cover day and nighttime  1-15: Add Voltaren  to low back as well for adjunctive control  4. Mood/Behavior/Sleep: LCSW to follow for evaluation and support.              -antipsychotic agents: N/A               1/6: Add as needed melatonin 5 mg at bedtime   - Sleeping better using CPAP, minimized interruptions  5. Neuropsych/cognition: This patient may be capable of making decisions on his own behalf.   - 1/6: Cog eval limiting - patient with limited participation and dizziness - SLP not noting true cog deficits so not picking up for ongoing  therapies  1-11--sleep improved which helps his arousal  6. Skin/Wound Care: Routine pressure relief measures             -eucerin cream to bilateral hands and feet bid, pt states he's been dealing with it for several months. psoriasis-- follows with Dr. Tarfeen (dermatology) for the past 6 years    - R toe abrasion   - Wound care in place for scrotum/peroneal wound  7. Fluids/Electrolytes/Nutrition: Strict I/O. Labs with HD   - AM labs with stable hyponatremia; monitor w/ dialysis  8. MSSA/ Proteus mirabilis bacteremia/monocytosis: cefazolin  (changed from Cefepime  on 1/3 by ID) with HD till 1/9 to complete 2 weeks following line removal   - Levaquin  300 mg Q48H added on 01/04 for additional week  - 1/8: Per Dr Overton, patient with persistent monocytosis, would not change regimen unless s/s infection -   need hematology outpatient f/u on discharge -1/10 wbc's much improved today 13k 1-13:  Finished linezolid ; cont IV cefazolin  with HD for 4 weeks through 12-11-2024.  Repeat CBC with dialysis today--WBCs downtrending  9.  T2DM: Hgb A1C- 8.4%. Monitor BS ac/hs and use SSI for elevated BS             --continue Lantus  increased to 25 units on 01/05 and on 5 units TID for meal coverage.              1/7: Eating 25 to 80% of meals.  Cautious to overdo meal coverage given variability.  Increase Lantus  to 30 units daily.  1-8: Blood sugars looking better, monitor today  1-9: Increase Semglee  to 35 units; if further increases, would split to twice daily  1/12: Increase Premeal to 8 units-- improved    1/14: Hypoglycemic yesterday PM and this AM - reduce semglee  to 30 U;  intakes remain 100%  1-15: Consistent lows in the AM; well-controlled to high in the mornings and afternoons.  DC Premeal insulin  with dinner and sliding scale nightly to avoid over-coverage.  Add at bedtime snack.  If low again tomorrow a.m., will reduce long-acting (home 15 U).  Recent Labs    12/03/24 0447 12/03/24 0507 12/03/24 0536  GLUCAP 47* 63* 91      10. HTN: BP has been on low side likely due to sepsis.  --On coreg  6.25 mg bid. Continue to hold Norvasc .  -1-6 stable--monitor closely and get orthostatics in a.m. 1/7: Mildly hypertensive this a.m.  Variability with dialysis, will add as needed hydralazine  for SBP greater than 170 and monitor 1/8: resume norvasc  at low dose 2.5 mg daily. Continue to hold torsemide  - 1-13: Increase Norvasc  to 5 mg daily--improved 1-15: Remains slightly hypertensive, but overall better controlled.  Monitor 1 to 2 days, then may increase Norvasc  again versus resume home torsemide     12/03/2024    8:24 AM 12/03/2024    5:10 AM 12/03/2024    5:09 AM  Vitals with BMI  Weight  277 lbs 2 oz   BMI  35.56   Systolic 149  149  Diastolic 71  71  Pulse   79    11. Fournier's gangrene s/p I & D 08/2024: Continue to pack wound bid.   - 1-15: New photo of wounds today, looks significantly improved.  Continue current management.  12. Anemia of chronic disease: On weekly aranesp . Continue to monitor H/H. - Labs with dialysis--stable  13.  OSA: Continue CPAP  14. Constipation: On Miralax  and senna but refusing second dose-->will simplify regimen.   -  LBM 1/15, medium  15. ESRD. Dialysis T TH Sat.   - Makes small amount of urine, incontinent at baseline  16. Vertigo/Dizziness -likely central vertigo but may be complicated by orthostasis -- Reports occipital HA, blurry vision and dizziness once up-vestibular eval pending - 1/6 Add PRN meclizine  12.5 mg TID, scopolamine  patch - 1-8: Worsening vertigo today, but appears scopolamine  patch fell off; ordered  replacement--  - 1/13: Left posteriorlateral vestibulopathy on eval, c/b neck and back pain. Set up with [facility] for vestibular therapy - on Hx is chronic for several years + worsening with recent SDH 1-15: Symptoms are improving, primarily bothersome in the AM.  Continue current regimen.  LOS: 10 days A FACE TO FACE EVALUATION WAS PERFORMED  Joesph JAYSON Likes 12/03/2024, 9:56 AM     "

## 2024-12-03 NOTE — Discharge Instructions (Addendum)
 Inpatient Rehab Discharge Instructions  Ledell Codrington. Discharge date and time:    Activities/Precautions/ Functional Status: Activity: no lifting, driving, or strenuous exercise for till cleared by MD Diet: diabetic diet Limit fluids to 1500 cc per day.  Wound Care: Cleanse scrotal/perineal wound with Vashe wound cleanser, do not rinse. Using a Q tip applicator apply Vashe moistened Kerlix into wound bed 2 times daily.  Cover with ABD pad and attempt to secure with Mesh underwear   Functional status:  ___ No restrictions     ___ Walk up steps independently ___ 24/7 supervision/assistance   ___ Walk up steps with assistance ___ Intermittent supervision/assistance  ___ Bathe/dress independently ___ Walk with walker     ___ Bathe/dress with assistance ___ Walk Independently    ___ Shower independently _X__ Walk with supervision.     ___ Shower with assistance _X__ No alcohol     ___ Return to work/school ________   Special Instructions:  COMMUNITY REFERRALS UPON DISCHARGE:    Outpatient: PT      OT              Agency: [facility] location   Phone: [phone]              Appointment Date/Time:*Please expect follow-up within 7-10 business days to schedule your appointment. If you have not received follow-up, be sure to contact the site directly.*      My questions have been answered and I understand these instructions. I will adhere to these goals and the provided educational materials after my discharge from the hospital.  Patient/Caregiver Signature _______________________________ Date __________  Clinician Signature _______________________________________ Date __________  Please bring this form and your medication list with you to all your follow-up doctor's appointments.

## 2024-12-03 NOTE — Progress Notes (Addendum)
 Attempted calling wife per request, no answer at this time. Confidential VM left. Will continue attempts to update wife on out-pt HD new schedule.   Shayden Gingrich Dialysis Navigator [phone]  Addendum 1016 am Pt wife called navigator back at this time, his new chair time was discussed,and all info was given, including start date and address. - During this call, Pt wife expressed concerns about how long he would be running at the clinic, since he is now 3x week instead of 4, she was not sure if his run time would change. navigator recommended wife to speak with his nephrologist, as navigator is not equipped to answer this, she was agreeable to this and had no further questions for navigator at this time. SABRA

## 2024-12-03 NOTE — Discharge Summary (Signed)
 Physician Discharge Summary  Patient ID: Brendan [name]. MRN: [id number] DOB/AGE: 20-Jan-1956 69 y.o.  Admit date: 11/23/2024 Discharge date: 12/05/2024  Discharge Diagnoses:  Principal Problem:   TBI (traumatic brain injury) (HCC) Active Problems:   Insulin -requiring or dependent type II diabetes mellitus (HCC)   Hypertension associated with diabetes (HCC)   ESRD on dialysis (HCC)   Anemia of chronic disease   Open wound   Bacteremia   Debility   Vestibulopathy  Monocytosis   Discharged Condition: stable  Significant Diagnostic Studies: N/A   Labs:  Basic Metabolic Panel:    Latest Ref Rng & Units 12/04/2024   11:23 AM 12/02/2024    4:51 AM 11/28/2024    6:01 AM  BMP  Glucose 70 - 99 mg/dL 83  86  805   BUN 8 - 23 mg/dL 40  51  32   Creatinine 0.61 - 1.24 mg/dL 2.64  1.29  4.03   Sodium 135 - 145 mmol/L 132  136  130   Potassium 3.5 - 5.1 mmol/L 4.6  5.1  4.3   Chloride 98 - 111 mmol/L 95  98  93   CO2 22 - 32 mmol/L 24  26  28    Calcium 8.9 - 10.3 mg/dL 8.9  8.5  8.4      CBC:    Latest Ref Rng & Units 12/04/2024   11:23 AM 12/01/2024    7:48 PM 11/28/2024    6:01 AM  CBC  WBC 4.0 - 10.5 K/uL 13.5  12.2  13.1   Hemoglobin 13.0 - 17.0 g/dL 8.5  8.0  7.9   Hematocrit 39.0 - 52.0 % 25.7  24.0  23.8   Platelets 150 - 400 K/uL 351  330  308      CBG: Recent Labs  Lab 12/03/24 0447 12/03/24 0507 12/03/24 0536 12/03/24 1112 12/03/24 1634  GLUCAP 47* 63* 91 189* 156*    Brief HPI:   Brendan [name]. is a 69 y.o. male with history of T2DM, cervical and lumbar radiculopathy with neuropathy, fatty liver, atopic dermatitis, ESRD with recent transition to HD, Fournier's gangrene perineum and scrotum status post I&D 08/2024 and forearm abscess status close I&D 10/22/2024, recent fall 11/03/2024 where he struck his head and found to have right frontal meningioma, posterior scalp with precentral scalp hematoma and discharged to home on 12/18.  He was readmitted on 12/24  sustaining 5 with DKA, hypotension, malaise and fever due to sepsis.  He was found to have MSSA and Proteus bacteremia felt to be due to line infection.    TEE was negative for vegetation.  TCD was removed on 1226 for line holiday and repeat blood cultures without growth.  ID was consulted for input and cefepime  changed to cefazolin  for 4 total weeks of antibiotic treatment from line removal and Levaquin  to continue for an additional week at renally dose.  His blood sugars were noted to be variable and Lantus  was being adjusted.  Mentation was improving but he was noted to be deconditioned and was requiring assistance with ADLs and mobility.  CIR was recommended due to functional decline.   Hospital course: [facility]. was admitted to rehab 11/23/2024 for inpatient therapies to consist of PT and OT at least three hours five days a week. Past admission physiatrist, therapy team and rehab RN have worked together to provide customized collaborative inpatient rehab. He reported ongoing issues with dizziness, occipital HA and blurriness since his fall in December. Vestibular evaluation ordered  and scopolamine  patch added to help manage symptoms which have been bothersome in am.  He was found to have left posteriorlateral vestibulopathy and has been educated on HEP.  HD has been ongoing on MWF at the end of the day to help with tolerance of therapy.   His blood pressures were monitored on TID basis and has been controlled overall. Anemia of chronic disease has been manage with weekly aranesp . He has been compliant with CPAP use. Chronic scrotal wound is healing well and without drainage. Constipation has resolved. His blood sugars have been monitored with ac/hs CBG checks and SSI was use prn for tighter BS control. Blood sugars have been labile and as intake gradually improved, meal coverage novolog  as well as insulin  glargine were titrated upwards. He had had intermittent issues with am hypoglycemia and pm meal  coverage has been titrated off. Po intake has been good and he was advised to resume home regimen after discharge. He has completed a week of Levaquin  and is to continue on cefazolin  through 01/23 to complete 4-week course of antibiotic.  He is tolerating antibiotics without any side effects. Persistent monocytosis noted with recommendations to follow up with hematology after discharge. He has made good gains during his stay and intermittent supervision recommended after discharge. He will continue to receive follow up outpatient PT and OT at [facility] after discharge.    Rehab course: During patient's stay in rehab weekly team conferences were held to monitor patient's progress, set goals and discuss barriers to discharge. At admission, patient required min to mod assist with basic ADL tasks and min assist with mobility. Cognition was [facility] in all domains and decreased initiation likely to to dizziness and decreased participation.  He  has had improvement in activity tolerance, balance, postural control as well as ability to compensate for deficits. He is able to complete ADL tasks at modified independent level but continues to be limited by left unilateral vestibulopathy leading to dizziness and oscillopsia. He is independent for transfers and is able to ambulate 200' with use of RW and verbal cues. Family education has been completed.    Discharge disposition: Home with outpatient therapy.   Diet: Renal diet. 1500 cc FR/day  Special Instructions: No driving or strenuous activity till cleared by MD. 2. Recommend referral to Hematology for evaluation of Monocytosis.    Discharge Instructions     Ambulatory referral to Occupational Therapy   Complete by: As directed    Eval and treat   Ambulatory referral to Physical Medicine Rehab   Complete by: As directed    Ambulatory referral to Physical Therapy   Complete by: As directed    Eval and tret      Allergies as of 12/05/2024        Reactions   Ace Inhibitors Cough   Maxidex  [dexamethasone ] Other (See Comments)   Sexual dysfunction   Verapamil Other (See Comments)   constipation        Medication List     STOP taking these medications    clobetasol  ointment 0.05 % Commonly known as: TEMOVATE    Darbepoetin Alfa  100 MCG/0.5ML Sosy injection Commonly known as: ARANESP    levofloxacin  500 MG tablet Commonly known as: LEVAQUIN        TAKE these medications    acetaminophen  500 MG tablet Commonly known as: TYLENOL  Take 500 mg by mouth every 6 (six) hours as needed for mild pain.   amLODipine  5 MG tablet Commonly known as: NORVASC  Take 1  tablet (5 mg total) by mouth daily.   Bimzelx 160 MG/ML prefilled syringe Generic drug: bimekizumab-bkzx Inject 1 Syringe into the skin every 30 (thirty) days.   calcitRIOL  0.25 MCG capsule Commonly known as: ROCALTROL  Take 1 capsule (0.25 mcg total) by mouth daily.   carvedilol  6.25 MG tablet Commonly known as: COREG  Take 1 tablet (6.25 mg total) by mouth 2 (two) times daily with a meal.   ceFAZolin  2-4 GM/100ML-% IVPB Commonly known as: ANCEF  Inject 100 mLs (2 g total) into the vein every Monday, Wednesday, and Friday. After dialysis What changed:  when to take this additional instructions   diclofenac  Sodium 1 % Gel Commonly known as: VOLTAREN  Apply 4 g topically 4 (four) times daily. To shoulder   dorzolamide  2 % ophthalmic solution Commonly known as: TRUSOPT  Place 1 drop into both eyes daily.   FreeStyle Libre 3 Plus Sensor Misc Apply to upper back of arm. Change every 15 days. What changed: Another medication with the same name was removed. Continue taking this medication, and follow the directions you see here.   hydrocerin Crea Apply 1 Application topically 2 (two) times daily. To bilateral hands and feet   insulin  glargine-yfgn 100 UNIT/ML injection Commonly known as: Semglee  (yfgn) Inject 0.25 mLs (25 Units total) into the skin daily.    insulin  lispro 200 UNIT/ML KwikPen Commonly known as: HUMALOG  Inject 5 Units into the skin with breakfast, with lunch, and with evening meal. What changed: Another medication with the same name was removed. Continue taking this medication, and follow the directions you see here.   Insupen Pen Needles 32G X 4 MM Misc Generic drug: Insulin  Pen Needle Use to inject insulin  up to 4 times daily. What changed: Another medication with the same name was removed. Continue taking this medication, and follow the directions you see here.   levothyroxine  125 MCG tablet Commonly known as: SYNTHROID  Take 1 tablet (125 mcg total) by mouth every morning on an empty stomach   lidocaine  5 % Commonly known as: LIDODERM  Place 2 patches onto the skin daily. On for 12 hour and has to be off for at least 12 hours in between use.   meclizine  12.5 MG tablet Commonly known as: ANTIVERT  Take 1 tablet (12.5 mg total) by mouth 3 (three) times daily as needed for dizziness.   Mounjaro  15 MG/0.5ML Pen Generic drug: tirzepatide  Inject 15 mg into the skin once a week. Notes to patient: Can resume at home.    scopolamine  1 MG/3DAYS Commonly known as: TRANSDERM-SCOP Place 1 patch (1 mg total) onto the skin every 3 (three) days. Notes to patient: Change every 72 hours (3 days)   simvastatin  20 MG tablet Commonly known as: ZOCOR  Take 1 tablet (20 mg total) by mouth every evening.   Stool Softener/Laxative 50-8.6 MG tablet Generic drug: senna-docusate Take 1 tablet by mouth 2 (two) times daily.   timolol  0.5 % ophthalmic solution Commonly known as: BETIMOL  Place 1 drop into both eyes daily.        Follow-up Information     Center, [facility]. Go on 12/07/2024.   Why: Please arrive 12:05pm for your 12;25pm appointment on 1/19.   you will continue to go here every monday, wednesday, and friday at 12:25pm Contact information: [address] [place] [address] [phone]         Emeline Search C, DO Follow up.   Specialty: Physical Medicine and Rehabilitation Why: office will call you with follow up appointment Contact information:  [address] [place] [address] [phone]         Gillie Duncans, MD. Call.   Specialty: Neurosurgery Why: As needed Contact information: [address] [place] [address] [phone]         Rexanne Ingle, MD. Call.   Specialty: Internal Medicine Why: Monday for post hospital follow up Contact information: [address] [place] [address] [phone]         [facility]. Call.   Why: for follow up on perineal wound. Contact information: [address] [place]   [phone]                Signed: Sharlet GORMAN Schmitz 12/09/2024, 4:57 PM

## 2024-12-03 NOTE — Progress Notes (Signed)
 Occupational Therapy Session Note  Patient Details  Name: Brendan [name]. MRN: [id number] Date of Birth: 1956/03/21  Today's Date: 12/03/2024 OT Individual Time: 9169-9084 OT Individual Time Calculation (min): 45 min    Short Term Goals: Week 1:  OT Short Term Goal 1 (Week 1): Patient to perform grooming sitting at the sink with Set up OT Short Term Goal 1 - Progress (Week 1): Met OT Short Term Goal 2 (Week 1): Patient to perform LE dressing with AE and Min assist OT Short Term Goal 2 - Progress (Week 1): Met OT Short Term Goal 3 (Week 1): Patient to perform toileting with CGA OT Short Term Goal 3 - Progress (Week 1): Met OT Short Term Goal 4 (Week 1): Patient to perform shower transfer with SBA. OT Short Term Goal 4 - Progress (Week 1): Met Week 2:  OT Short Term Goal 1 (Week 2): STG= LTG d/t ELOS  Skilled Therapeutic Interventions/Progress Updates:    1:1 Pt received sitting EOB. Focus on bathing and dressing at EOB ; with special attention to foot care. Pt donned TEDS today with total A to help with the lower Leg swelling. Recommended them being elevated in recliner at end of session. Pt able to perform dressing with mod I. Pt ambulated with supervision to recliner and left with LEs elevated.   Today's Date: 12/03/2024 OT Individual Time: 1305-1400 OT Individual Time Calculation (min): 55 min 2nd session 1:1  PT received in the w/c and reports back pain along the right side. PT declined mobility. Pt and therapist went through full self guided vestibular exercise with cue cards made and laminated. Family was not present for fam education at this time.   Pt recommended to continue to do the vestibular exercises at least 2x a day.   PT left sitting in the chair  (his choice).  Therapy Documentation Precautions:  Precautions Precautions: Fall Recall of Precautions/Restrictions: Intact Precaution/Restrictions Comments: L vestibulopathy with oscillopsia Restrictions Weight Bearing  Restrictions Per Provider Order: No Other Position/Activity Restrictions: increased dizziness with transitions supine-sit-stand; not orthostatic on eval. General:   Vital Signs: Therapy Vitals Temp: 98 F (36.7 C) Pulse Rate: (!) 59 Resp: 18 BP: (!) 159/68 Patient Position (if appropriate): Sitting Oxygen Therapy SpO2: 100 % O2 Device: Room Air Pain: No c/o pain in first session   Second session : below. Requested meds and declined walking or options to move around or lay down. Helped  reposition with pillow.  Pain Assessment Pain Scale: 0-10 Pain Score: 6  Pain Location: Back   Therapy/Group: Individual Therapy  Claudene Nest [facility] 12/03/2024, 2:19 PM

## 2024-12-03 NOTE — Progress Notes (Signed)
 Hypoglycemic Event  CBG: 47  Treatment: 8 oz juice/soda  Symptoms: Nervous/irritable  Pt states 'butterflies in my stomach'  Follow-up CBG: Time: 0507 + 0526 CBG Result: 63 + 91  Possible Reasons for Event: Medication Regimen  Comments/MD notified: Pt blood sugar at 2105 = 166. No insulin  was given per orders. Dinner insulin  dose of 8 units was also not given per pt refusal. Pt had similar event yesterday morning (1/14).     Brendan [name]

## 2024-12-03 NOTE — Progress Notes (Signed)
 Physical Therapy Session Note  Patient Details  Name: Brendan [name]. MRN: [id number] Date of Birth: 1955/12/09  Today's Date: 12/03/2024 PT Individual Time: 1018-1059 PT Individual Time Calculation (min): 41 min   Today's Date: 12/03/2024 PT Individual Time: 8565-8484 PT Individual Time Calculation (min): 41 min   Short Term Goals: Week 2:  PT Short Term Goal 1 (Week 2): STGs = LTGs  Skilled Therapeutic Interventions/Progress Updates:     1st Session: Pt received seated in WC and agrees to therapy. No complaint of pain. WC transport to gym for time management. Pt performs alternating LAQs to prepare knee extensors for mobility. Pt performs sit to stand with cues for initiation. Pt ambulates x400' with RW and CGA, with cues for upright gaze to improve posture and balance, and decreasing WB through RW for energy conservation. Pt takes extended seated rest break. Pt then performs stair training for strengthening and confidence building. Pt performs step ups onto 6 step with bilateral handrails 3x10 with seated rest breaks. PT provides CGA and cues for body mechanics and increasing glute engagement. Following pt self propels WC backward with BLEs to strengthen knee extensors, x200' with cues for navigation and body mechanics. Pt left seated with all needs within reach.  2nd Session: Pt received seated in [facility] and agrees to therapy. Wife and daughter present for family education. Pt reports pain in back> Number not provided. PT provides rest breaks as needed to manage pain. PT provides update on pt's mobility and progress with therapy, as well as making recommendations for safe mobility following discharge. WC transport to gym. Pt performs ramp navigation and car transfer with RW and cues for sequencing and safety. Seated rest break. Pt stands and ambulates x100' with RW and cues for posture and decreasing WB through RW for energy conservation and improved body mechanics. Pt takes seated rest break. Pt  then performs stair training. Pt's preference it to step up and down from first 6 step with bilateral handrails. Pt completes x6 with PT providing CGA. Seated rest break. PT then provides education on safe guarding technique and pt completes x8 with wife guarding, and then additional x3 up and x3 down prior to rest break. PT demonstrates to patient and family optimal stair navigation techniques. WC transport back to room. Left seated with all needs within reach.   Therapy Documentation Precautions:  Precautions Precautions: Fall Recall of Precautions/Restrictions: Intact Precaution/Restrictions Comments: L vestibulopathy with oscillopsia Restrictions Weight Bearing Restrictions Per Provider Order: No Other Position/Activity Restrictions: increased dizziness with transitions supine-sit-stand; not orthostatic on eval.    Therapy/Group: Individual Therapy  Elsie JAYSON Dawn, PT, DPT 12/03/2024, 4:10 PM

## 2024-12-04 ENCOUNTER — Telehealth (HOSPITAL_COMMUNITY): Payer: Self-pay

## 2024-12-04 ENCOUNTER — Other Ambulatory Visit (HOSPITAL_COMMUNITY): Payer: Self-pay

## 2024-12-04 LAB — RENAL FUNCTION PANEL
Albumin: 2.8 g/dL — ABNORMAL LOW (ref 3.5–5.0)
Anion gap: 13 (ref 5–15)
BUN: 40 mg/dL — ABNORMAL HIGH (ref 8–23)
CO2: 24 mmol/L (ref 22–32)
Calcium: 8.9 mg/dL (ref 8.9–10.3)
Chloride: 95 mmol/L — ABNORMAL LOW (ref 98–111)
Creatinine, Ser: 7.35 mg/dL — ABNORMAL HIGH (ref 0.61–1.24)
GFR, Estimated: 7 mL/min — ABNORMAL LOW
Glucose, Bld: 83 mg/dL (ref 70–99)
Phosphorus: 6.1 mg/dL — ABNORMAL HIGH (ref 2.5–4.6)
Potassium: 4.6 mmol/L (ref 3.5–5.1)
Sodium: 132 mmol/L — ABNORMAL LOW (ref 135–145)

## 2024-12-04 LAB — CBC
HCT: 25.7 % — ABNORMAL LOW (ref 39.0–52.0)
Hemoglobin: 8.5 g/dL — ABNORMAL LOW (ref 13.0–17.0)
MCH: 27.1 pg (ref 26.0–34.0)
MCHC: 33.1 g/dL (ref 30.0–36.0)
MCV: 81.8 fL (ref 80.0–100.0)
Platelets: 351 K/uL (ref 150–400)
RBC: 3.14 MIL/uL — ABNORMAL LOW (ref 4.22–5.81)
RDW: 17.1 % — ABNORMAL HIGH (ref 11.5–15.5)
WBC: 13.5 K/uL — ABNORMAL HIGH (ref 4.0–10.5)
nRBC: 0 % (ref 0.0–0.2)

## 2024-12-04 LAB — GLUCOSE, CAPILLARY
Glucose-Capillary: 117 mg/dL — ABNORMAL HIGH (ref 70–99)
Glucose-Capillary: 79 mg/dL (ref 70–99)
Glucose-Capillary: 107 mg/dL — ABNORMAL HIGH (ref 70–99)

## 2024-12-04 MED ORDER — CEFAZOLIN SODIUM-DEXTROSE 2-4 GM/100ML-% IV SOLN: 2.0000 g | INTRAVENOUS | Status: DC

## 2024-12-04 MED ORDER — CEFAZOLIN SODIUM-DEXTROSE 2-4 GM/100ML-% IV SOLN: 2.0000 g | INTRAVENOUS | Status: AC

## 2024-12-04 MED ORDER — CALCITRIOL 0.25 MCG PO CAPS
0.2500 ug | ORAL_CAPSULE | Freq: Every day | ORAL | 0 refills | Status: AC
  Filled 2024-12-04: qty 30, 30d supply, fill #0

## 2024-12-04 MED ORDER — MECLIZINE HCL 12.5 MG PO TABS
12.5000 mg | ORAL_TABLET | Freq: Three times a day (TID) | ORAL | 0 refills | Status: AC | PRN
  Filled 2024-12-04: qty 30, 10d supply, fill #0

## 2024-12-04 MED ORDER — CARVEDILOL 6.25 MG PO TABS
6.2500 mg | ORAL_TABLET | Freq: Two times a day (BID) | ORAL | 0 refills | Status: AC
  Filled 2024-12-04: qty 60, 30d supply, fill #0

## 2024-12-04 MED ORDER — SCOPOLAMINE 1 MG/3DAYS TD PT72
1.0000 | MEDICATED_PATCH | TRANSDERMAL | Status: DC
  Administered 2024-12-04: 1 mg via TRANSDERMAL
  Filled 2024-12-04: qty 1

## 2024-12-04 MED ORDER — LIDOCAINE 5 % EX PTCH
2.0000 | MEDICATED_PATCH | CUTANEOUS | 0 refills | Status: AC
  Filled 2024-12-04: qty 60, 30d supply, fill #0

## 2024-12-04 MED ORDER — SENNOSIDES-DOCUSATE SODIUM 8.6-50 MG PO TABS
1.0000 | ORAL_TABLET | Freq: Two times a day (BID) | ORAL | 0 refills | Status: AC
  Filled 2024-12-04: qty 60, 30d supply, fill #0

## 2024-12-04 MED ORDER — SCOPOLAMINE 1 MG/3DAYS TD PT72
1.0000 | MEDICATED_PATCH | TRANSDERMAL | 0 refills | Status: AC
  Filled 2024-12-04: qty 10, 30d supply, fill #0

## 2024-12-04 MED ORDER — HYDROCERIN EX CREA: 1.0000 | TOPICAL_CREAM | Freq: Two times a day (BID) | CUTANEOUS | Status: DC

## 2024-12-04 MED ORDER — DICLOFENAC SODIUM 1 % EX GEL
4.0000 g | Freq: Four times a day (QID) | CUTANEOUS | 0 refills | Status: AC
  Filled 2024-12-04: qty 100, 6d supply, fill #0

## 2024-12-04 NOTE — Progress Notes (Addendum)
 Wound Supplies for home given to patient. Nurse made aware.Per nurse wife has been educated with wound care.

## 2024-12-04 NOTE — Progress Notes (Signed)
" °   12/04/24 1831  Vitals  Temp 97.9 F (36.6 C)  Pulse Rate 75  Resp 15  BP (!) 168/75  SpO2 100 %  O2 Device Room Air  Weight 122.5 kg  Type of Weight Post-Dialysis  Oxygen Therapy  Patient Activity (if Appropriate) In bed  Pulse Oximetry Type Continuous  Oximetry Probe Site Changed No  Post Treatment  Dialyzer Clearance Lightly streaked  Liters Processed 84  Fluid Removed (mL) 3500 mL  Tolerated HD Treatment Yes   Received patient in bed to unit.  Alert and oriented.  Informed consent signed and in chart.   TX duration: 3.5 Patient tolerated well.  Transported back to the room  Alert, without acute distress.  Hand-off given to patient's nurse.   Access used: [email or web address] Access issues: no complications  Total UF removed: 3500 Medication(s) given: none   Delon LITTIE [facility] "

## 2024-12-04 NOTE — Progress Notes (Signed)
 Contacted [facility] on [address] today to confirm plans for pt to d/c tomorrow and start on Monday. Renal NP to send orders to clinic today. HD arrangements placed on AVS by Abigail Pack.   [name] Dialysis Navigator [phone] (Coverage)

## 2024-12-04 NOTE — Progress Notes (Signed)
 Occupational Therapy Session Note  Patient Details  Name: Brendan [name]. MRN: [id number] Date of Birth: [birth year]  Today's Date: 12/04/2024 OT Individual Time: 9269-9174 and 1100-1154 OT Individual Time Calculation (min): 55 min and 54 min   Short Term Goals: Week 1:  OT Short Term Goal 1 (Week 1): Patient to perform grooming sitting at the sink with Set up OT Short Term Goal 1 - Progress (Week 1): Met OT Short Term Goal 2 (Week 1): Patient to perform LE dressing with AE and Min assist OT Short Term Goal 2 - Progress (Week 1): Met OT Short Term Goal 3 (Week 1): Patient to perform toileting with CGA OT Short Term Goal 3 - Progress (Week 1): Met OT Short Term Goal 4 (Week 1): Patient to perform shower transfer with SBA. OT Short Term Goal 4 - Progress (Week 1): Met Week 2:  OT Short Term Goal 1 (Week 2): STG= LTG d/t ELOS   Skilled Therapeutic Interventions/Progress Updates:    Session 1: Pt sitting up at EOB at time of session, nursing finishing up foot care. Focus of session on morning ADL routine, UB/LB dressing including socks and shoes with set up, sit <> stands and short distance mob with MOD I, bathing sink level same manner. Pt bathing seated per request. Declined toileting at this time. Remainder of session focused on home program for vestibulopathy using hand out and cards to follow program - pt performing mod I with good recall. Seated up in chair at end of session call bell in reach.  Session 2: Pt bed level at time of session, sleeping but easily aroused. Pt with flat affect throughout session. MD visit at this time as well. Pt supine > sit with MOD I and bed features. Sitting EOB extended time for dizziness to subside. Multiple interruptions during session for lab, MD, and BS check. Stand pivot MOD I > wheelchair and propelled to gym. Pt feeling like BS dropping and requesting apple juice which was provided and felt better. Pt performing UBE for cardio respiratory endurance  and UB strength on level 3 for 10 minutes with a break at half way point. Back in room set up call bell in reach all needs met.   Therapy Documentation Precautions:  Precautions Precautions: Fall Recall of Precautions/Restrictions: Intact Precaution/Restrictions Comments: L vestibulopathy with oscillopsia Restrictions Weight Bearing Restrictions Per Provider Order: No Other Position/Activity Restrictions: increased dizziness with transitions supine-sit-stand; not orthostatic on eval.    Therapy/Group: Individual Therapy  Chiquita JAYSON Hopping 12/04/2024, 7:20 AM

## 2024-12-04 NOTE — Plan of Care (Signed)
" °  Problem: Consults Goal: RH GENERAL PATIENT EDUCATION Description: See Patient Education module for education specifics. 12/04/2024 0725 by Linna Harlee NOVAK, RN Outcome: Progressing 12/04/2024 0725 by Linna Harlee NOVAK, RN Outcome: Progressing   Problem: RH BOWEL ELIMINATION Goal: RH STG MANAGE BOWEL WITH ASSISTANCE Description: STG Manage Bowel with mod I aupervision Assistance. Outcome: Progressing   Problem: RH SKIN INTEGRITY Goal: RH STG SKIN FREE OF INFECTION/BREAKDOWN Description: Manage skin free of infections with mod I - supervision assistance  Outcome: Progressing   Problem: RH SAFETY Goal: RH STG ADHERE TO SAFETY PRECAUTIONS W/ASSISTANCE/DEVICE Description: STG Adhere to Safety Precautions With mod I supervision Assistance/Device. Outcome: Progressing   Problem: RH PAIN MANAGEMENT Goal: RH STG PAIN MANAGED AT OR BELOW PT'S PAIN GOAL Description: <4 w/ prns Outcome: Progressing   Problem: RH KNOWLEDGE DEFICIT GENERAL Goal: RH STG INCREASE KNOWLEDGE OF SELF CARE AFTER HOSPITALIZATION Description: Manage increase knowledge of self care after hospitalization with mod I- supervision assistance from wife using educational materials provided Outcome: Progressing   "

## 2024-12-04 NOTE — Progress Notes (Signed)
 "                                                        PROGRESS NOTE   Subjective/Complaints: No acute complaints. no events overnight..  Was elevated overnight, but look much better today.  No concerns about discharge tomorrow.   ROS: Patient denies fever, rash, sore throat, blurred vision,   nausea, vomiting, diarrhea, cough, shortness of breath or chest pain,  headache, or mood change.   + Back pain + HYPOglycemia  Objective:   No results found. Recent Labs    12/01/24 1948 12/04/24 1123  WBC 12.2* 13.5*  HGB 8.0* 8.5*  HCT 24.0* 25.7*  PLT 330 351    Recent Labs    12/02/24 0451 12/04/24 1123  NA 136 132*  K 5.1 4.6  CL 98 95*  CO2 26 24  GLUCOSE 86 83  BUN 51* 40*  CREATININE 8.70* 7.35*  CALCIUM 8.5* 8.9     Intake/Output Summary (Last 24 hours) at 12/04/2024 1726 Last data filed at 12/04/2024 1300 Gross per 24 hour  Intake 576 ml  Output 100 ml  Net 476 ml        Physical Exam: Vital Signs Blood pressure (!) 160/75, pulse (!) 59, temperature 98.5 F (36.9 C), resp. rate 15, height 6' 2 (1.88 m), weight 126 kg, SpO2 100%. Constitutional: No distress . Vital signs reviewed.  In bed HEENT: NCAT, EOMI, oral membranes moist.   Neck: supple Cardiovascular: RRR without murmur. No JVD    Respiratory/Chest: CTA Bilaterally without wheezes or rales. Normal effort    GI/Abdomen: BS +, non-tender, non-distended Ext: no clubbing, cyanosis, or edema Psych: pleasant and cooperative  Skin:  Psoriasis, flaky skin bilateral palms and feet--improving Callus under right great toe.  Tunneled dialysis cath left internal jugular.  Perineal wound -covered, photo 1-15 as below, healing well. L 1st toe wound - is covered   MSK:  No apparent deformity.  + TTP right mid thoracic to upper lumbar's paraspinals, with some palpable tightness--unchanged  Neurologic exam:  Alert and oriented x 3.    Mild memory deficits, improving Good insight and awareness  normal  speech and language. Cranial nerves II through XII intact 4+/5 strength distal and proximal bilateral upper and lower extremities. Peripheral neuropathy in bilateral feet distal to the ankle worse on the left  Physical exam unchanged from the above on reexamination 12/04/24    Assessment/Plan: 1. Functional deficits which require 3+ hours per day of interdisciplinary therapy in a comprehensive inpatient rehab setting. Physiatrist is providing close team supervision and 24 hour management of active medical problems listed below. Physiatrist and rehab team continue to assess barriers to discharge/monitor patient progress toward functional and medical goals  Care Tool:  Bathing    Body parts bathed by patient: Right arm, Left arm, Chest, Abdomen, Front perineal area, Buttocks, Right upper leg, Left upper leg, Right lower leg, Left lower leg, Face   Body parts bathed by helper: Left lower leg, Right lower leg, Buttocks     Bathing assist Assist Level: Set up assist     Upper Body Dressing/Undressing Upper body dressing   What is the patient wearing?: Pull over shirt    Upper body assist Assist Level: Independent    Lower Body Dressing/Undressing Lower body dressing  What is the patient wearing?: Pants, Underwear/pull up     Lower body assist Assist for lower body dressing: Set up assist     Toileting Toileting    Toileting assist Assist for toileting: Independent with assistive device     Transfers Chair/bed transfer  Transfers assist     Chair/bed transfer assist level: Independent with assistive device Chair/bed transfer assistive device: Geologist, Engineering   Ambulation assist      Assist level: Supervision/Verbal cueing Assistive device: Walker-rolling Max distance: 200'   Walk 10 feet activity   Assist     Assist level: Supervision/Verbal cueing Assistive device: Walker-rolling   Walk 50 feet activity   Assist Walk 50 feet  with 2 turns activity did not occur: Safety/medical concerns  Assist level: Supervision/Verbal cueing Assistive device: Walker-rolling    Walk 150 feet activity   Assist Walk 150 feet activity did not occur: Safety/medical concerns  Assist level: Supervision/Verbal cueing Assistive device: Walker-rolling    Walk 10 feet on uneven surface  activity   Assist Walk 10 feet on uneven surfaces activity did not occur: Safety/medical concerns   Assist level: Supervision/Verbal cueing Assistive device: Walker-rolling   Wheelchair     Assist Is the patient using a wheelchair?: No Type of Wheelchair: Manual    Wheelchair assist level: Dependent - Patient 0%      Wheelchair 50 feet with 2 turns activity    Assist        Assist Level: Dependent - Patient 0%   Wheelchair 150 feet activity     Assist      Assist Level: Dependent - Patient 0%   Blood pressure (!) 160/75, pulse (!) 59, temperature 98.5 F (36.9 C), resp. rate 15, height 6' 2 (1.88 m), weight 126 kg, SpO2 100%.   Medical Problem List and Plan: 1. Functional deficits secondary to debility due subacute TBI with SDH s/p fall             -patient may  shower             -ELOS/Goals: 10-12 days, supervision to mod I goals - 1/17 DC   -Continue CIR therapies including PT, OT, and SLP    - 1/13: Perseverates on weakness but walks 175 ft with walker CGA, CGA to Min A with stairs and sit to stand.  The patient is medically ready for discharge to home 1/17 and will need follow-up with [facility] PM&R. In addition, they will need to follow up with their PCP, Nephrology.   2.  Antithrombotics: -DVT/anticoagulation:  Pharmaceutical: Heparin              -antiplatelet therapy: N/A  3. Pain Management: Tylenol  prn.   1-8: Tension headache; add Voltaren  gel 4 times daily and as needed lidocaine  patches to posterior neck--symptoms improved 1/11  1-12: Dr. Nursing to place lidocaine  patches on mid upper back for  chronic pain--scheduled 2 patches along with intermittent heat 1-13  - 1-14: Increase Lidoderm  patch to every 12 hours to cover day and nighttime  1-15: Add Voltaren  to low back as well for adjunctive control  4. Mood/Behavior/Sleep: LCSW to follow for evaluation and support.              -antipsychotic agents: N/A              1/6: Add as needed melatonin 5 mg at bedtime   - Sleeping better using CPAP, minimized interruptions  5. Neuropsych/cognition: This patient may be capable  of making decisions on his own behalf.   - 1/6: Cog eval limiting - patient with limited participation and dizziness - SLP not noting true cog deficits so not picking up for ongoing therapies  1-11--sleep improved which helps his arousal  6. Skin/Wound Care: Routine pressure relief measures             -eucerin cream to bilateral hands and feet bid, pt states he's been dealing with it for several months. psoriasis-- follows with Dr. Tarfeen (dermatology) for the past 6 years    - R toe abrasion   - Wound care in place for scrotum/peroneal wound  7. Fluids/Electrolytes/Nutrition: Strict I/O. Labs with HD   - AM labs with stable hyponatremia; monitor w/ dialysis  8. MSSA/ Proteus mirabilis bacteremia/monocytosis: cefazolin  (changed from Cefepime  on 1/3 by ID) with HD till 1/9 to complete 2 weeks following line removal   - Levaquin  300 mg Q48H added on 01/04 for additional week  - 1/8: Per Dr Overton, patient with persistent monocytosis, would not change regimen unless s/s infection -   need hematology outpatient f/u on discharge -1/10 wbc's much improved today 13k 1-13:  Finished linezolid ; cont IV cefazolin  with HD for 4 weeks through 12-11-2024.  Repeat CBC with dialysis today--WBCs downtrending  9.  T2DM: Hgb A1C- 8.4%. Monitor BS ac/hs and use SSI for elevated BS             --continue Lantus  increased to 25 units on 01/05 and on 5 units TID for meal coverage.              1/7: Eating 25 to 80% of meals.  Cautious  to overdo meal coverage given variability.  Increase Lantus  to 30 units daily.  1-8: Blood sugars looking better, monitor today  1-9: Increase Semglee  to 35 units; if further increases, would split to twice daily  1/12: Increase Premeal to 8 units-- improved    1/14: Hypoglycemic yesterday PM and this AM - reduce semglee  to 30 U; intakes remain 100%  1-15: Consistent lows in the AM; well-controlled to high in the mornings and afternoons.  DC Premeal insulin  with dinner and sliding scale nightly to avoid over-coverage.  Add at bedtime snack.  If low again tomorrow a.m., will reduce long-acting (home 15 U).   1-16: Blood sugars looking much better, can resume home regimen at discharge Recent Labs    12/03/24 2120 12/04/24 0638 12/04/24 1125  GLUCAP 249* 117* 79      10. HTN: BP has been on low side likely due to sepsis.  --On coreg  6.25 mg bid. Continue to hold Norvasc .  -1-6 stable--monitor closely and get orthostatics in a.m. 1/7: Mildly hypertensive this a.m.  Variability with dialysis, will add as needed hydralazine  for SBP greater than 170 and monitor 1/8: resume norvasc  at low dose 2.5 mg daily. Continue to hold torsemide  - 1-13: Increase Norvasc  to 5 mg daily--improved 1-15: Remains slightly hypertensive, but overall better controlled.  Monitor 1 to 2 days, then may increase Norvasc  again versus resume home torsemide  1-16: Blood pressure 140s over 70s, asymptomatic, dialysis pending today; further titration can be made after discharge    12/04/2024    5:00 PM 12/04/2024    4:30 PM 12/04/2024    4:00 PM  Vitals with BMI  Systolic 160 161 842  Diastolic 75 72 67  Pulse 59 58 58    11. Fournier's gangrene s/p I & D 08/2024: Continue to pack wound bid.   -  1-15: New photo of wounds today, looks significantly improved.  Continue current management.  12. Anemia of chronic disease: On weekly aranesp . Continue to monitor H/H. - Labs with dialysis--stable  13.  OSA: Continue  CPAP  14. Constipation: On Miralax  and senna but refusing second dose-->will simplify regimen.   - LBM 1/15, medium  15. ESRD. Dialysis T TH Sat.   - Makes small amount of urine, incontinent at baseline  16. Vertigo/Dizziness -likely central vertigo but may be complicated by orthostasis -- Reports occipital HA, blurry vision and dizziness once up-vestibular eval pending - 1/6 Add PRN meclizine  12.5 mg TID, scopolamine  patch - 1-8: Worsening vertigo today, but appears scopolamine  patch fell off; ordered replacement--  - 1/13: Left posteriorlateral vestibulopathy on eval, c/b neck and back pain. Set up with [facility] for vestibular therapy - on Hx is chronic for several years + worsening with recent SDH 1-15: Symptoms are improving, primarily bothersome in the AM.  Continue current regimen.  LOS: 11 days A FACE TO FACE EVALUATION WAS PERFORMED  Joesph JAYSON Likes 12/04/2024, 5:26 PM     "

## 2024-12-04 NOTE — Progress Notes (Signed)
" °  [facility] Progress Note    Assessment/ Plan:   1. Sepsis 2/2 MSSA and proteus bacteremia - suspected TDC infection s/p line holiday.  Replaced on 11/17/24. WBC finally trending down. Per ID needs IV cefazolin  w/ HD x 4 weeks thru 12/11/2024.  completed Levaquin  course during admission on 11/29/24.    2. ESRD - previously considered AKI (was dialyzing 4x week at [facility] now deemed ESRD. CLIP: [facility] MWF. HD on MWF schedule, HD today. Will need referral to [facility] for perm access creation as OP 3. Anemia of CKD- Hgb 8.0. On ESA - aranesp  100mcg qtues--changed to Monday and dose increased to 200mcg.  No iron  with recent infection/IV antibiotics.  4. Secondary hyperparathyroidism - Ca and phos in goal. Not on binders. Continue VDRA.  5. HTN/volume - Continue coreg  6.25mg  BID and amlodipine  2.5mg  at bedtime.  LE edema on exam (improving). Max UF as tolerated. Will need lower EDW on d/c.   6. Nutrition - Renal diet w/fluid restrictions.  7. Recent Traumatic brain injury 2/2 to fall - in CIR.  Subjective:   Patient seen in room. No complaints.   Objective:   BP (!) 146/65 (BP Location: Right Arm)   Pulse (!) 59   Temp 97.9 F (36.6 C) (Oral)   Resp 18   Ht 6' 2 (1.88 m)   Wt 123.6 kg   SpO2 100%   BMI 34.99 kg/m   Intake/Output Summary (Last 24 hours) at 12/04/2024 0859 Last data filed at 12/04/2024 0700 Gross per 24 hour  Intake 716 ml  Output --  Net 716 ml   Weight change: -0.413 kg  Physical Exam: Gen: NAD, in wheelchair CVS: RRR Resp: normal wob, unlabored Abd: soft Ext: trace pitting edema b/l Les Neuro: awake, alert Dialysis access: [facility]  Imaging: No results found.  Labs: BMET Recent Labs  Lab 11/28/24 0601 12/02/24 0451  NA 130* 136  K 4.3 5.1  CL 93* 98  CO2 28 26  GLUCOSE 194* 86  BUN 32* 51*  CREATININE 5.96* 8.70*  CALCIUM 8.4* 8.5*   CBC Recent Labs  Lab 11/28/24 0601 12/01/24 1948  WBC 13.1* 12.2*  NEUTROABS 9.2* 8.3*  HGB 7.9* 8.0*   HCT 23.8* 24.0*  MCV 82.1 81.9  PLT 308 330    Medications:     amLODipine   5 mg Oral Daily   calcitRIOL   0.25 mcg Oral Daily   carvedilol   6.25 mg Oral BID WC   Chlorhexidine  Gluconate Cloth  6 each Topical BID   Chlorhexidine  Gluconate Cloth  6 each Topical Q0600   [START ON 12/07/2024] darbepoetin (ARANESP ) injection - DIALYSIS  200 mcg Subcutaneous Q Mon-1800   diclofenac  Sodium  4 g Topical QID   dorzolamide   1 drop Both Eyes Daily   heparin   5,000 Units Subcutaneous Q8H   hydrocerin   Topical BID   insulin  aspart  0-6 Units Subcutaneous TID WC   insulin  aspart  8 Units Subcutaneous BID WC   insulin  glargine  30 Units Subcutaneous Daily   levothyroxine   125 mcg Oral q AM   lidocaine   2 patch Transdermal Q12H   scopolamine   1 patch Transdermal Q72H   senna-docusate  1 tablet Oral BID   simvastatin   20 mg Oral QPM   timolol   1 drop Both Eyes Daily      Ephriam Stank, MD [facility] 12/04/2024, 8:59 AM    "

## 2024-12-04 NOTE — Progress Notes (Signed)
 Inpatient Rehabilitation Discharge Medication Review by a Pharmacist  A complete drug regimen review was completed for this patient to identify any potential clinically significant medication issues.  High Risk Drug Classes Is patient taking? Indication by Medication  Antipsychotic No   Anticoagulant No   Antibiotic Yes, as an intravenous medication Cefazolin  - bacteremia   Opioid No   Antiplatelet No   Hypoglycemics/insulin  Yes Insulin  glargine, lispro - DM Mounjaro  - weight loss  Vasoactive Medication Yes Coreg , Amlodipine  - HTN  Chemotherapy No   Other Yes Lidocaine  patch, diclofenac  gel - topical pain Scopolamine , meclizine  - PRN dizziness Senokot-S - constipation Bemizelx - psoriasis  Calcitriol  - hyper-PTH Dorzolamide , timolol  opth - glaucoma Levothyroxine  - hypothyroid Simvastatin  - HLD     Type of Medication Issue Identified Description of Issue Recommendation(s)  Drug Interaction(s) (clinically significant)     Duplicate Therapy     Allergy     No Medication Administration End Date     Incorrect Dose     Additional Drug Therapy Needed     Significant med changes from prior encounter (inform family/care partners about these prior to discharge). Torsemide  discontinued Communicate relevant medication changes to patient/family members at discharge from CIR.   Other       Clinically significant medication issues were identified that warrant physician communication and completion of prescribed/recommended actions by midnight of the next day:  No   Pharmacist comments: n/a   Time spent performing this drug regimen review (minutes): 20   Thank you for allowing pharmacy to be a part of this patients care.  Shelba Collier, PharmD, BCPS Clinical Pharmacist

## 2024-12-04 NOTE — Telephone Encounter (Signed)
 Pharmacy Patient Advocate Encounter  Received notification from [facility] that Prior Authorization for Scopolamine  1MG /3DAYS 72 hr patches has been APPROVED from 12/04/24 to 12/04/25   PA #/Case ID/Reference #: [employer]

## 2024-12-04 NOTE — Progress Notes (Signed)
 Patient ID: Brendan Balingit., male   DOB: 1956/01/01, 69 y.o.   MRN: [id number]   1258- SW spoke with pt wife to discuss discharge recs. Outpatient PT/OT. Prefers [facility] or [facility]. SW discussed [facility] as closer to [employer] but has therapy he needs. [facility] to [facility]. Reports has rollator and RW  already. Encouraged o use RW until informed otherwise.   Brendan [name], MSW, LCSW Office: [phone] Cell: [phone] Fax: [phone]

## 2024-12-04 NOTE — Progress Notes (Signed)
 Physical Therapy Discharge Summary  Patient Details  Name: Brendan [name]. MRN: [id number] Date of Birth: 03-03-1956  Date of Discharge from PT service:December 04, 2024  Today's Date: 12/04/2024 PT Individual Time: 0918-1030 PT Individual Time Calculation (min): 72 min    Patient has met 8 of 8 long term goals due to improved activity tolerance, improved balance, improved postural control, and increased strength.  Patient to discharge at an ambulatory level Supervision.   Patient's care partner is independent to provide the necessary physical assistance at discharge.  Reasons goals not met: NA  Recommendation:  Patient will benefit from ongoing skilled PT services in outpatient setting to continue to advance safe functional mobility, address ongoing impairments in strength, balance, transfers, ambulation, endurance, and minimize fall risk.  Equipment: No equipment provided  Reasons for discharge: treatment goals met and discharge from hospital  Patient/family agrees with progress made and goals achieved: Yes  Skilled Therapeutic Interventions: Pt received seated in [facility] and agrees to therapy. Reports pain in low back. Lidocaine  patch in place. PT provides education on benefits of ambulation for pain relief, as well as providing rest breaks for pain management. WC transport to gym. Pt completes ramp navigation and car transfer with cues for sequencing and safety. Seated rest break. Pt performs sit to stand and ambulates x200' with RW, with cues for upright posture to improve body mechanics and energy conservation, and ensuring adequate step height to decrease risk for falls. Seated rest break. Pt completes x12 6 steps with bilateral handrails and cues for step sequencing and safety. Seated rest break. Pt self propels WC x175' with BLEs backward to promote knee extensor strengthening. Pt transfers to Nustep with RW. Pt completes 3x5:00 at workload of 5 with average steps per minute ~60. PT  provides cues for hand and foot placement and completing full available ROM. Stand step back to [facility] with cues for positioning. Left seated in WC with all needs within reach.   PT Discharge Precautions/Restrictions Precautions Precautions: Fall Precaution/Restrictions Comments: L vestibulopathy with oscillopsia Restrictions Weight Bearing Restrictions Per Provider Order: No Pain Interference Pain Interference Pain Effect on Sleep: 2. Occasionally Pain Interference with Therapy Activities: 1. Rarely or not at all Pain Interference with Day-to-Day Activities: 1. Rarely or not at all Vision/Perception  Vision - History Ability to See in Adequate Light: 0 Adequate Vision - Assessment Eye Alignment: Within Functional Limits Ocular Range of Motion: Within Functional Limits Alignment/Gaze Preference: Within Defined Limits Convergence: Within functional limits Perception Perception: Within Functional Limits Praxis Praxis: WFL  Cognition Overall Cognitive Status: Within Functional Limits for tasks assessed Arousal/Alertness: Awake/alert Orientation Level: Oriented X4 Awareness: Appears intact Problem Solving: Appears intact [place] Scales of Cognitive Functioning: Purposeful, Appropriate: Modified Independent Sensation Sensation Light Touch: Impaired Detail Additional Comments: Fine touch mildly impaired; tingling but does not report prior dx of neuropathy at hands. reported neuropathy at feet Coordination Gross Motor Movements are Fluid and Coordinated: Yes Fine Motor Movements are Fluid and Coordinated: Yes Motor  Motor Motor: Within Functional Limits Motor - Discharge Observations: generalized weakness, much improved  Mobility Bed Mobility Bed Mobility: Rolling Right;Supine to Sit;Sit to Supine Rolling Right: Independent Supine to Sit: Independent with assistive device Sit to Supine: Independent with assistive device Transfers Transfers: Sit to Stand;Stand to  Sit;Stand Pivot Transfers Sit to Stand: Independent with assistive device Stand to Sit: Independent with assistive device Stand Pivot Transfers: Independent with assistive device Transfer (Assistive device): Rolling walker Locomotion  Gait Ambulation: Yes Gait Assistance:  Supervision/Verbal cueing Gait Distance (Feet): 200 Feet Assistive device: Rolling walker Gait Assistance Details: Verbal cues for safe use of DME/AE;Verbal cues for precautions/safety;Verbal cues for gait pattern Gait Gait: Yes Gait Pattern: Impaired Gait Pattern: Decreased stride length;Trunk flexed Gait velocity: reduced Stairs / Additional Locomotion Stairs: Yes Stairs Assistance: Supervision/Verbal cueing Stair Management Technique: Two rails Number of Stairs: 12 Height of Stairs: 6 Ramp: Supervision/Verbal cueing Curb: Supervision/Verbal cueing Wheelchair Mobility Wheelchair Mobility: No  Trunk/Postural Assessment  Cervical Assessment Cervical Assessment: Within Functional Limits Thoracic Assessment Thoracic Assessment: Within Functional Limits Lumbar Assessment Lumbar Assessment: Within Functional Limits Postural Control Postural Control: Deficits on evaluation Righting Reactions: delayed  Balance Balance Balance Assessed: Yes Static Sitting Balance Static Sitting - Balance Support: Feet supported Static Sitting - Level of Assistance: 6: Modified independent (Device/Increase time) Dynamic Sitting Balance Dynamic Sitting - Balance Support: Feet supported Dynamic Sitting - Level of Assistance: 6: Modified independent (Device/Increase time) Static Standing Balance Static Standing - Balance Support: During functional activity;Bilateral upper extremity supported Static Standing - Level of Assistance: 6: Modified independent (Device/Increase time) Dynamic Standing Balance Dynamic Standing - Balance Support: During functional activity;Bilateral upper extremity supported Dynamic Standing - Level of  Assistance: 6: Modified independent (Device/Increase time) Extremity Assessment  RLE Assessment RLE Assessment: Exceptions to [facility] Strength Comments: grossly 4/5, endurance limited LLE Assessment LLE Assessment: Exceptions to [facility] Strength Comments: grossly 4/5, endurance limited   Elsie JAYSON Dawn, PT, DPT 12/04/2024, 4:16 PM

## 2024-12-04 NOTE — Discharge Planning (Addendum)
 [facility]  Initial Hemodialysis Orders  Dialysis center: [facility]  *Was on TCU for AKI, now deemed ESRD*  *UPDATED VERSION regarding ABX dose*  Patient's name: Brendan [name]. DOB: 1956/02/19 AKI or ESRD: ESRD  PMH: HTN, T2DM   Discharge diagnosis: Sepsis 2/2 MSSA and proteus bacteremia - See #6 Previously AKI, now deemed ESRD  Recent Traumatic brain injury 2/2 to fall - was in CIR  Allergies: Allergies[1]  Date of First Dialysis: 08/2024 Cause of renal disease: Cardiorenal syndrome  Dialysis Prescription: Dialysis Frequency: MWF Tx duration: 4hrs BFR: 400 DFR: 800 EDW: 123.5kg  Dialyzer: 180NRe UF profile/Sodium modeling?: None Dialysis Bath: 2 K/2 Ca  Dialysis access: Access: TDC  In Center Medications: Heparin  Dose: 2500 units with HD   Type: Bolus VDRA: Calcitriol  0.25mcg q HD Venofer : Per protocol Aranesp  100mcg given on 12/01/24. Change to Mircera: 150mcg every 2 weeks. Next dose due: 12/08/24 No blood transfusion noted during this hospitalization Sepsis 2/2 MSSA and proteus bacteremia - suspected TDC infection s/p line holiday.  Replaced on 11/17/24. Per ID needs IV cefazolin  2GM w/ HD x 4 weeks thru 12/14/2024-I verified this with pharmacy today (12/05/24). Completed Levaquin  course during admission on 11/29/24.   Discharge labs: Hgb: 8.5  K+: 4.6  Ca: 8.9  Phos: 6.1  Alb: 2.8  Additional notes/follow-up:   *Refer to [facility] for perm access placement. We need to discuss this with the patient*  Charmaine Piety, NP       [1]  Allergies Allergen Reactions   Ace Inhibitors Cough   Maxidex  [Dexamethasone ] Other (See Comments)    Sexual dysfunction   Verapamil Other (See Comments)    constipation

## 2024-12-05 DIAGNOSIS — R739 Hyperglycemia, unspecified: Secondary | ICD-10-CM

## 2024-12-05 LAB — GLUCOSE, CAPILLARY
Glucose-Capillary: 238 mg/dL — ABNORMAL HIGH (ref 70–99)
Glucose-Capillary: 114 mg/dL — ABNORMAL HIGH (ref 70–99)

## 2024-12-05 NOTE — Progress Notes (Signed)
 "                                                        PROGRESS NOTE   Subjective/Complaints:  Pt doing well, ready to go home. Slept well, denies pain, LBM last night, urinating per his usual, dialysis yesterday went fine. All questions answered. No other complaints or concerns.    ROS: as per HPI. Denies CP, SOB, abd pain, N/V/D/C, or any other complaints at this time.    + Back pain + HYPOglycemia  Objective:   No results found. Recent Labs    12/04/24 1123  WBC 13.5*  HGB 8.5*  HCT 25.7*  PLT 351    Recent Labs    12/04/24 1123  NA 132*  K 4.6  CL 95*  CO2 24  GLUCOSE 83  BUN 40*  CREATININE 7.35*  CALCIUM 8.9     Intake/Output Summary (Last 24 hours) at 12/05/2024 1006 Last data filed at 12/05/2024 0746 Gross per 24 hour  Intake 458 ml  Output 3500 ml  Net -3042 ml        Physical Exam: Vital Signs Blood pressure (!) 121/56, pulse 68, temperature 98.8 F (37.1 C), resp. rate 18, height 6' 2 (1.88 m), weight 122.5 kg, SpO2 95%. Constitutional: No distress . Vital signs reviewed.  In bed eating breakfast HEENT: NCAT, EOMI, oral membranes moist.   Neck: supple Cardiovascular: RRR without murmur. No JVD    Respiratory/Chest: CTA Bilaterally without wheezes or rales. Normal effort    GI/Abdomen: BS +, non-tender, non-distended Ext: no clubbing or cyanosis, trace BLE edema Psych: pleasant and cooperative   PRIOR EXAMS: Skin:  Psoriasis, flaky skin bilateral palms and feet--improving Callus under right great toe.  Tunneled dialysis cath left internal jugular.  Perineal wound -covered, photo 1-15 as below, healing well. L 1st toe wound - is covered   MSK:  No apparent deformity.  + TTP right mid thoracic to upper lumbar's paraspinals, with some palpable tightness--unchanged  Neurologic exam:  Alert and oriented x 3.    Mild memory deficits, improving Good insight and awareness  normal speech and language. Cranial nerves II through XII  intact 4+/5 strength distal and proximal bilateral upper and lower extremities. Peripheral neuropathy in bilateral feet distal to the ankle worse on the left   Assessment/Plan: 1. Functional deficits which require 3+ hours per day of interdisciplinary therapy in a comprehensive inpatient rehab setting. Physiatrist is providing close team supervision and 24 hour management of active medical problems listed below. Physiatrist and rehab team continue to assess barriers to discharge/monitor patient progress toward functional and medical goals  Care Tool:  Bathing    Body parts bathed by patient: Right arm, Left arm, Chest, Abdomen, Front perineal area, Buttocks, Right upper leg, Left upper leg, Right lower leg, Left lower leg, Face   Body parts bathed by helper: Left lower leg, Right lower leg, Buttocks     Bathing assist Assist Level: Set up assist     Upper Body Dressing/Undressing Upper body dressing   What is the patient wearing?: Pull over shirt    Upper body assist Assist Level: Independent    Lower Body Dressing/Undressing Lower body dressing      What is the patient wearing?: Pants, Underwear/pull up     Lower body assist  Assist for lower body dressing: Set up assist     Toileting Toileting    Toileting assist Assist for toileting: Independent with assistive device     Transfers Chair/bed transfer  Transfers assist     Chair/bed transfer assist level: Independent with assistive device Chair/bed transfer assistive device: Geologist, Engineering   Ambulation assist      Assist level: Supervision/Verbal cueing Assistive device: Walker-rolling Max distance: 200'   Walk 10 feet activity   Assist     Assist level: Supervision/Verbal cueing Assistive device: Walker-rolling   Walk 50 feet activity   Assist Walk 50 feet with 2 turns activity did not occur: Safety/medical concerns  Assist level: Supervision/Verbal cueing Assistive  device: Walker-rolling    Walk 150 feet activity   Assist Walk 150 feet activity did not occur: Safety/medical concerns  Assist level: Supervision/Verbal cueing Assistive device: Walker-rolling    Walk 10 feet on uneven surface  activity   Assist Walk 10 feet on uneven surfaces activity did not occur: Safety/medical concerns   Assist level: Supervision/Verbal cueing Assistive device: Walker-rolling   Wheelchair     Assist Is the patient using a wheelchair?: No Type of Wheelchair: Manual    Wheelchair assist level: Dependent - Patient 0%      Wheelchair 50 feet with 2 turns activity    Assist        Assist Level: Dependent - Patient 0%   Wheelchair 150 feet activity     Assist      Assist Level: Dependent - Patient 0%   Blood pressure (!) 121/56, pulse 68, temperature 98.8 F (37.1 C), resp. rate 18, height 6' 2 (1.88 m), weight 122.5 kg, SpO2 95%.   Medical Problem List and Plan: 1. Functional deficits secondary to debility due subacute TBI with SDH s/p fall             -patient may  shower             -ELOS/Goals: 10-12 days, supervision to mod I goals - 1/17 DC   -Continue CIR therapies including PT, OT, and SLP    - 1/13: Perseverates on weakness but walks 175 ft with walker CGA, CGA to Min A with stairs and sit to stand.  The patient is medically ready for discharge to home 1/17 and will need follow-up with [facility] PM&R. In addition, they will need to follow up with their PCP, Nephrology.   -12/05/24 d/c home, paperwork reviewed, questions answered  2.  Antithrombotics: -DVT/anticoagulation:  Pharmaceutical: Heparin              -antiplatelet therapy: N/A  3. Pain Management: Tylenol  prn.   1-8: Tension headache; add Voltaren  gel 4 times daily and as needed lidocaine  patches to posterior neck--symptoms improved 1/11  1-12: Dr. Nursing to place lidocaine  patches on mid upper back for chronic pain--scheduled 2 patches along with intermittent  heat 1-13  - 1-14: Increase Lidoderm  patch to every 12 hours to cover day and nighttime  1-15: Add Voltaren  to low back as well for adjunctive control  4. Mood/Behavior/Sleep: LCSW to follow for evaluation and support.              -antipsychotic agents: N/A              1/6: Add as needed melatonin 5 mg at bedtime   - Sleeping better using CPAP, minimized interruptions  5. Neuropsych/cognition: This patient may be capable of making decisions on his own behalf.   -  1/6: Cog eval limiting - patient with limited participation and dizziness - SLP not noting true cog deficits so not picking up for ongoing therapies  1-11--sleep improved which helps his arousal  6. Skin/Wound Care: Routine pressure relief measures             -eucerin cream to bilateral hands and feet bid, pt states he's been dealing with it for several months. psoriasis-- follows with Dr. Tarfeen (dermatology) for the past 6 years    - R toe abrasion   - Wound care in place for scrotum/peroneal wound  7. Fluids/Electrolytes/Nutrition: Strict I/O. Labs with HD   - AM labs with stable hyponatremia; monitor w/ dialysis  8. MSSA/ Proteus mirabilis bacteremia/monocytosis: cefazolin  (changed from Cefepime  on 1/3 by ID) with HD till 1/9 to complete 2 weeks following line removal   - Levaquin  300 mg Q48H added on 01/04 for additional week  - 1/8: Per Dr Overton, patient with persistent monocytosis, would not change regimen unless s/s infection -   need hematology outpatient f/u on discharge -1/10 wbc's much improved today 13k 1-13:  Finished linezolid ; cont IV cefazolin  with HD for 4 weeks through 12-11-2024.  Repeat CBC with dialysis today--WBCs downtrending  9.  T2DM: Hgb A1C- 8.4%. Monitor BS ac/hs and use SSI for elevated BS             --continue Lantus  increased to 25 units on 01/05 and on 5 units TID for meal coverage.              1/7: Eating 25 to 80% of meals.  Cautious to overdo meal coverage given variability.  Increase  Lantus  to 30 units daily.  1-8: Blood sugars looking better, monitor today  1-9: Increase Semglee  to 35 units; if further increases, would split to twice daily  1/12: Increase Premeal to 8 units-- improved    1/14: Hypoglycemic yesterday PM and this AM - reduce semglee  to 30 U; intakes remain 100%  1-15: Consistent lows in the AM; well-controlled to high in the mornings and afternoons.  DC Premeal insulin  with dinner and sliding scale nightly to avoid over-coverage.  Add at bedtime snack.  If low again tomorrow a.m., will reduce long-acting (home 15 U).   1-16: Blood sugars looking much better, can resume home regimen at discharge Recent Labs    12/04/24 1125 12/04/24 1854 12/05/24 0643  GLUCAP 79 107* 238*      10. HTN: BP has been on low side likely due to sepsis.  --On coreg  6.25 mg bid. Continue to hold Norvasc .  -1-6 stable--monitor closely and get orthostatics in a.m. 1/7: Mildly hypertensive this a.m.  Variability with dialysis, will add as needed hydralazine  for SBP greater than 170 and monitor 1/8: resume norvasc  at low dose 2.5 mg daily. Continue to hold torsemide  - 1-13: Increase Norvasc  to 5 mg daily--improved 1-15: Remains slightly hypertensive, but overall better controlled.  Monitor 1 to 2 days, then may increase Norvasc  again versus resume home torsemide  1-16: Blood pressure 140s over 70s, asymptomatic, dialysis pending today; further titration can be made after discharge -12/05/24 BP better this morning, monitor outpatient    12/05/2024    3:53 AM 12/04/2024    7:49 PM 12/04/2024    7:00 PM  Vitals with BMI  Systolic 121 151 825  Diastolic 56 71 79  Pulse 68 65 63    11. Fournier's gangrene s/p I & D 08/2024: Continue to pack wound bid.   - 1-15: New photo  of wounds today, looks significantly improved.  Continue current management.  12. Anemia of chronic disease: On weekly aranesp . Continue to monitor H/H. - Labs with dialysis--stable  13.  OSA: Continue  CPAP  14. Constipation: On Miralax  and senna but refusing second dose-->will simplify regimen.   - LBM 1/16  15. ESRD. Dialysis T TH Sat.   - Makes small amount of urine, incontinent at baseline  16. Vertigo/Dizziness -likely central vertigo but may be complicated by orthostasis -- Reports occipital HA, blurry vision and dizziness once up-vestibular eval pending - 1/6 Add PRN meclizine  12.5 mg TID, scopolamine  patch - 1-8: Worsening vertigo today, but appears scopolamine  patch fell off; ordered replacement--  - 1/13: Left posteriorlateral vestibulopathy on eval, c/b neck and back pain. Set up with [facility] for vestibular therapy - on Hx is chronic for several years + worsening with recent SDH 1-15: Symptoms are improving, primarily bothersome in the AM.  Continue current regimen.  LOS: 12 days A FACE TO FACE EVALUATION WAS PERFORMED  [address] 12/05/2024, 10:06 AM     "

## 2024-12-05 NOTE — Progress Notes (Signed)
 " [name] Progress Note    Assessment/ Plan:   1. Sepsis 2/2 MSSA and proteus bacteremia - suspected TDC infection s/p line holiday.  Replaced on 11/17/24. WBC finally trending down. Per ID needs IV cefazolin  w/ HD x 4 weeks thru 12/11/2024.  completed Levaquin  course during admission on 11/29/24.    2. ESRD - previously considered AKI (was dialyzing 4x week at [facility]), now deemed ESRD. CLIP: [facility] MWF. HD on MWF schedule. Will need referral to [facility] for perm access creation as OP 3. Anemia of CKD- Hgb 8.5. On ESA - aranesp  100mcg qtues--changed to Monday and dose increased to 200mcg.  No iron  with recent infection/IV antibiotics.  4. Secondary hyperparathyroidism - Ca and phos in goal. Not on binders. Continue VDRA.  5. HTN/volume - Continue coreg  6.25mg  BID and amlodipine  2.5mg  at bedtime.  LE edema on exam (improving). Max UF as tolerated. Will need lower EDW on d/c.   6. Nutrition - Renal diet w/fluid restrictions.  7. Recent Traumatic brain injury 2/2 to fall - in CIR.  Subjective:   Patient seen in room. No complaints. Tolerated HD yesterday with net UF 3.5 L Going home today   Objective:   BP (!) 121/56 (BP Location: Right Arm)   Pulse 68   Temp 98.8 F (37.1 C)   Resp 18   Ht 6' 2 (1.88 m)   Wt 122.5 kg   SpO2 95%   BMI 34.67 kg/m   Intake/Output Summary (Last 24 hours) at 12/05/2024 9096 Last data filed at 12/04/2024 1831 Gross per 24 hour  Intake 100 ml  Output 3500 ml  Net -3400 ml   Weight change: 2.4 kg  Physical Exam: Gen: NAD, in bed CVS: RRR Resp: normal wob, unlabored Abd: soft Ext: trace pitting edema b/l Les Neuro: awake, alert Dialysis access: [facility]  Imaging: No results found.  Labs: BMET Recent Labs  Lab 12/02/24 0451 12/04/24 1123  NA 136 132*  K 5.1 4.6  CL 98 95*  CO2 26 24  GLUCOSE 86 83  BUN 51* 40*  CREATININE 8.70* 7.35*  CALCIUM 8.5* 8.9  PHOS  --  6.1*   CBC Recent Labs  Lab 12/01/24 1948 12/04/24 1123  WBC  12.2* 13.5*  NEUTROABS 8.3*  --   HGB 8.0* 8.5*  HCT 24.0* 25.7*  MCV 81.9 81.8  PLT 330 351    Medications:     amLODipine   5 mg Oral Daily   calcitRIOL   0.25 mcg Oral Daily   carvedilol   6.25 mg Oral BID WC   Chlorhexidine  Gluconate Cloth  6 each Topical BID   Chlorhexidine  Gluconate Cloth  6 each Topical Q0600   [START ON 12/07/2024] darbepoetin (ARANESP ) injection - DIALYSIS  200 mcg Subcutaneous Q Mon-1800   diclofenac  Sodium  4 g Topical QID   dorzolamide   1 drop Both Eyes Daily   heparin   5,000 Units Subcutaneous Q8H   hydrocerin   Topical BID   insulin  aspart  0-6 Units Subcutaneous TID WC   insulin  aspart  8 Units Subcutaneous BID WC   insulin  glargine  30 Units Subcutaneous Daily   levothyroxine   125 mcg Oral q AM   lidocaine   2 patch Transdermal Q12H   scopolamine   1 patch Transdermal Q72H   senna-docusate  1 tablet Oral BID   simvastatin   20 mg Oral QPM   timolol   1 drop Both Eyes Daily      Ephriam Stank, MD [facility] 12/05/2024, 9:03  AM    "

## 2024-12-07 NOTE — Progress Notes (Signed)
 Inpatient Rehabilitation Care Coordinator Discharge Note   Patient Details  Name: Brendan [name]. MRN: [id number] Date of Birth: 1956-08-19   Discharge location: D/c to home  Length of Stay: 11 days  Discharge activity level: Supervision  Home/community participation: Limited  Patient response un:Yzjouy Literacy - How often do you need to have someone help you when you read instructions, pamphlets, or other written material from your doctor or pharmacy?: Never  Patient response un:Dnrpjo Isolation - How often do you feel lonely or isolated from those around you?: Never  Services provided included: MD, RD, PT, OT, RN, TR, CM, Pharmacy, Neuropsych, SW  Financial Services:  Field Seismologist Utilized: Private [employer]  Choices offered to/list presented to: patient  Follow-up services arranged:  Outpatient, DME    Outpatient Servicies: [facility] for PT/OT DME : Has RW already    Patient response to transportation need: Is the patient able to respond to transportation needs?: Yes In the past 12 months, has lack of transportation kept you from medical appointments or from getting medications?: No In the past 12 months, has lack of transportation kept you from meetings, work, or from getting things needed for daily living?: No   Patient/Family verbalized understanding of follow-up arrangements:  Yes  Individual responsible for coordination of the follow-up plan: contact pt or pt wfe  Confirmed correct DME delivered: Brendan [name] 12/07/2024    Comments (or additional information):fam edu completed  Summary of Stay    Date/Time Discharge Planning CSW  12/01/24 0957 Pt will d/c to home with his wife who is primary caregiver. She reports there is PRN support from two out of three of their children. SW will confirm there are no barriers to discharge. AAC  11/24/24 0950 TBA AAC       Brendan [name] [name]

## 2024-12-07 NOTE — Progress Notes (Signed)
 Late Note Entry- Dec 07, 2024  Contacted [facility] on [address] this morning to be advised of pt's d/c date and that pt should resume care today as planned.   [name] Dialysis Navigator [phone] (Coverage)

## 2024-12-08 ENCOUNTER — Other Ambulatory Visit (HOSPITAL_COMMUNITY): Payer: Self-pay

## 2024-12-08 ENCOUNTER — Other Ambulatory Visit: Payer: Self-pay

## 2024-12-08 MED ORDER — MOUNJARO 5 MG/0.5ML ~~LOC~~ SOAJ
5.0000 mg | SUBCUTANEOUS | 1 refills | Status: AC
  Filled 2024-12-08 (×2): qty 2, 28d supply, fill #0

## 2024-12-09 DIAGNOSIS — H8190 Unspecified disorder of vestibular function, unspecified ear: Secondary | ICD-10-CM | POA: Insufficient documentation

## 2024-12-10 ENCOUNTER — Other Ambulatory Visit (HOSPITAL_COMMUNITY): Payer: Self-pay

## 2024-12-16 ENCOUNTER — Encounter: Attending: Registered Nurse | Admitting: Registered Nurse

## 2024-12-16 ENCOUNTER — Encounter: Payer: Self-pay | Admitting: Registered Nurse

## 2024-12-16 VITALS — BP 157/80 | HR 63 | Ht 74.0 in | Wt 255.0 lb

## 2024-12-16 DIAGNOSIS — S069X0D Unspecified intracranial injury without loss of consciousness, subsequent encounter: Secondary | ICD-10-CM | POA: Diagnosis present

## 2024-12-16 DIAGNOSIS — E1169 Type 2 diabetes mellitus with other specified complication: Secondary | ICD-10-CM

## 2024-12-16 DIAGNOSIS — Z992 Dependence on renal dialysis: Secondary | ICD-10-CM | POA: Insufficient documentation

## 2024-12-16 DIAGNOSIS — M545 Low back pain, unspecified: Secondary | ICD-10-CM | POA: Diagnosis present

## 2024-12-16 DIAGNOSIS — I10 Essential (primary) hypertension: Secondary | ICD-10-CM | POA: Insufficient documentation

## 2024-12-16 DIAGNOSIS — Z5181 Encounter for therapeutic drug level monitoring: Secondary | ICD-10-CM | POA: Diagnosis present

## 2024-12-16 DIAGNOSIS — Z79891 Long term (current) use of opiate analgesic: Secondary | ICD-10-CM | POA: Diagnosis present

## 2024-12-16 DIAGNOSIS — E119 Type 2 diabetes mellitus without complications: Secondary | ICD-10-CM

## 2024-12-16 DIAGNOSIS — G894 Chronic pain syndrome: Secondary | ICD-10-CM | POA: Insufficient documentation

## 2024-12-16 DIAGNOSIS — R5381 Other malaise: Secondary | ICD-10-CM

## 2024-12-16 DIAGNOSIS — G8929 Other chronic pain: Secondary | ICD-10-CM | POA: Insufficient documentation

## 2024-12-16 DIAGNOSIS — N186 End stage renal disease: Secondary | ICD-10-CM | POA: Diagnosis present

## 2024-12-16 NOTE — Progress Notes (Unsigned)
 "  Subjective:    Patient ID: Brendan Neysa Raddle., male    DOB: 1956-02-02, 69 y.o.   MRN: [id number]  HPI: Brendan Sabedra. is a 69 y.o. male who returns for follow up appointment for chronic pain and medication refill. states *** pain is located in  ***. rates pain ***. current exercise regime is walking and performing stretching exercises.    Pain Inventory Average Pain 10 Pain Right Now 10 My pain is constant and aching  LOCATION OF PAIN  back  BOWEL Number of stools per week: 3-5 Oral laxative use No  Type of laxative . Enema or suppository use No  History of colostomy No  Incontinent No   BLADDER Dialysis In and out cath, frequency . Able to self cath . Bladder incontinence . Frequent urination . Leakage with coughing . Difficulty starting stream . Incomplete bladder emptying .   Mobility use a walker how many minutes can you walk? 1-3 ability to climb steps?  no do you drive?  no  Function retired I need assistance with the following:  dressing, bathing, toileting, meal prep, household duties, and shopping  Neuro/Psych weakness numbness trouble walking dizziness  Prior Studies TC Appt  Physicians involved in your care TC appt   Family History  Problem Relation Age of Onset   Heart failure Mother    Hypertension Father        deceased age 14   Heart attack Father    COPD Sister    CVA Sister    Diabetes Daughter    Colon cancer Neg Hx    Stomach cancer Neg Hx    Colon polyps Neg Hx    Esophageal cancer Neg Hx    Rectal cancer Neg Hx    Social History   Socioeconomic History   Marital status: Married    Spouse name: Not on file   Number of children: 3   Years of education: Not on file   Highest education level: Not on file  Occupational History   Occupation: truck driver    Comment: Retired futures trader  Tobacco Use   Smoking status: Never   Smokeless tobacco: Never  Vaping Use   Vaping status: Never Used  Substance and Sexual  Activity   Alcohol use: Yes    Comment: one drink every few months - 1965   Drug use: No   Sexual activity: Yes  Other Topics Concern   Not on file  Social History Narrative   Right handed   Lives in a two story home    Drinks no caffeine    Social Drivers of Health   Tobacco Use: Low Risk (11/18/2024)   Patient History    Smoking Tobacco Use: Never    Smokeless Tobacco Use: Never    Passive Exposure: Not on file  Financial Resource Strain: Low Risk (01/08/2023)   Overall Financial Resource Strain (CARDIA)    Difficulty of Paying Living Expenses: Not hard at all  Food Insecurity: No Food Insecurity (11/12/2024)   Epic    Worried About Radiation Protection Practitioner of Food in the Last Year: Never true    Ran Out of Food in the Last Year: Never true  Transportation Needs: No Transportation Needs (11/12/2024)   Epic    Lack of Transportation (Medical): No    Lack of Transportation (Non-Medical): No  Physical Activity: Not on file  Stress: Not on file  Social Connections: Socially Integrated (11/12/2024)   Social Connection and Isolation Panel  Frequency of Communication with Friends and Family: More than three times a week    Frequency of Social Gatherings with Friends and Family: More than three times a week    Attends Religious Services: More than 4 times per year    Active Member of [employer] or Organizations: Yes    Attends Banker Meetings: More than 4 times per year    Marital Status: Married  Depression (EYV7-0): Not on file  Alcohol Screen: Not on file  Housing: Low Risk (11/12/2024)   Epic    Unable to Pay for Housing in the Last Year: No    Number of Times Moved in the Last Year: 0    Homeless in the Last Year: No  Utilities: At Risk (11/12/2024)   Epic    Threatened with loss of utilities: Yes  Health Literacy: Not on file   Past Surgical History:  Procedure Laterality Date   COLONOSCOPY  2006   normal   INCISION AND DRAINAGE OF WOUND N/A 08/28/2024    Procedure: IRRIGATION AND EXCISIONAL DEBRIDEMENT WOUND;  Surgeon: Lyndel Deward PARAS, MD;  Location: [facility] OR;  Service: General;  Laterality: N/A;  EXCISIONAL DEBRIDEMENT   INCISION AND DRAINAGE PERIRECTAL ABSCESS N/A 08/30/2024   Procedure: INCISION AND DRAINAGE OF PERINEAL WOUND;  Surgeon: Polly Cordella LABOR, MD;  Location: [facility] OR;  Service: General;  Laterality: N/A;   IR REMOVAL TUN CV CATH W/O FL  11/13/2024   IR TUNNELED CENTRAL VENOUS CATH PLC W IMG  09/18/2024   IR TUNNELED CENTRAL VENOUS CATH PLC W IMG  09/22/2024   IR TUNNELED CENTRAL VENOUS CATH PLC W IMG  11/17/2024   lap band surgery  2009   left inguinal hernia repair  1998   LIPOMA EXCISION Left 04/17/2023   Procedure: MINOR EXCISION LEFT BUTTOCK SEBACEOUS CYST;  Surgeon: Lyndel Deward PARAS, MD;  Location: [facility];  Service: General;  Laterality: Left;   robotic prostatectomy  2008   TRANSESOPHAGEAL ECHOCARDIOGRAM (CATH LAB) N/A 11/18/2024   Procedure: TRANSESOPHAGEAL ECHOCARDIOGRAM;  Surgeon: Santo Stanly LABOR, MD;  Location: [facility] INVASIVE CV LAB;  Service: Cardiovascular;  Laterality: N/A;   Past Medical History:  Diagnosis Date   Acanthosis nigricans    Atopic dermatitis    CHF (congestive heart failure) (HCC)    CKD (chronic kidney disease) stage 4, GFR 15-29 ml/min (HCC) 01/08/2023   Diabetes (HCC) 11/19/2002   Erectile dysfunction    Fatty liver 11/19/2005   Glaucoma 11/19/2006   Gynecomastia 11/20/2007   Hx of adenomatous colonic polyps 08/26/2023   Hyperaldosteronism 11/19/1998   Hypercholesterolemia    Hyperlipidemia 2010   Hypertension 1999   Hypoglycemic reaction    Hypothyroidism 11/19/2002   Incontinence    Intermittent vertigo 11/19/2010   Left cervical radiculopathy 11/19/2010   Lumbar radiculopathy    Obesity    Peripheral neuropathy    Pollen allergies 11/19/2005   perennial   Prostate cancer (HCC) 11/19/2004   Reflux esophagitis 11/19/1993   Sickle cell trait 11/19/2004    Sleep apnea, obstructive 11/19/1998   uses a cpap   Venous insufficiency 11/20/2003   Vitamin D deficiency 11/19/2010   There were no vitals taken for this visit.  Opioid Risk Score:   Fall Risk Score:  `1  Depression screen PHQ 2/9      No data to display           Review of Systems  Musculoskeletal:  Positive for back pain and gait problem.  Neurological:  Positive for weakness and numbness.  All other systems reviewed and are negative.      Objective:   Physical Exam        Assessment & Plan:    "

## 2024-12-17 ENCOUNTER — Ambulatory Visit: Attending: Physical Medicine and Rehabilitation

## 2024-12-17 ENCOUNTER — Other Ambulatory Visit: Payer: Self-pay

## 2024-12-17 DIAGNOSIS — R2681 Unsteadiness on feet: Secondary | ICD-10-CM | POA: Insufficient documentation

## 2024-12-17 DIAGNOSIS — H8111 Benign paroxysmal vertigo, right ear: Secondary | ICD-10-CM | POA: Insufficient documentation

## 2024-12-17 DIAGNOSIS — R262 Difficulty in walking, not elsewhere classified: Secondary | ICD-10-CM | POA: Insufficient documentation

## 2024-12-17 DIAGNOSIS — R42 Dizziness and giddiness: Secondary | ICD-10-CM | POA: Insufficient documentation

## 2024-12-17 DIAGNOSIS — M5459 Other low back pain: Secondary | ICD-10-CM | POA: Insufficient documentation

## 2024-12-17 DIAGNOSIS — S069X0D Unspecified intracranial injury without loss of consciousness, subsequent encounter: Secondary | ICD-10-CM | POA: Insufficient documentation

## 2024-12-17 NOTE — Therapy (Signed)
 " OUTPATIENT PHYSICAL THERAPY VESTIBULAR EVALUATION     Patient Name: Brendan [name]. MRN: [id number] DOB:01-Oct-1956, 69 y.o., male Today's Date: 12/17/2024  END OF SESSION:  PT End of Session - 12/17/24 0754     Visit Number 1    Number of Visits 17    Date for Recertification  02/11/25    Authorization Type BCBS Medicare    Authorization Time Period no VL, no auth required    PT Start Time 0800    PT Stop Time 0845    PT Time Calculation (min) 45 min          Past Medical History:  Diagnosis Date   Acanthosis nigricans    Atopic dermatitis    CHF (congestive heart failure) (HCC)    CKD (chronic kidney disease) stage 4, GFR 15-29 ml/min (HCC) 01/08/2023   Diabetes (HCC) 11/19/2002   Erectile dysfunction    Fatty liver 11/19/2005   Glaucoma 11/19/2006   Gynecomastia 11/20/2007   Hx of adenomatous colonic polyps 08/26/2023   Hyperaldosteronism 11/19/1998   Hypercholesterolemia    Hyperlipidemia 2010   Hypertension 1999   Hypoglycemic reaction    Hypothyroidism 11/19/2002   Incontinence    Intermittent vertigo 11/19/2010   Left cervical radiculopathy 11/19/2010   Lumbar radiculopathy    Obesity    Peripheral neuropathy    Pollen allergies 11/19/2005   perennial   Prostate cancer (HCC) 11/19/2004   Reflux esophagitis 11/19/1993   Sickle cell trait 11/19/2004   Sleep apnea, obstructive 11/19/1998   uses a cpap   Venous insufficiency 11/20/2003   Vitamin D deficiency 11/19/2010   Past Surgical History:  Procedure Laterality Date   COLONOSCOPY  2006   normal   INCISION AND DRAINAGE OF WOUND N/A 08/28/2024   Procedure: IRRIGATION AND EXCISIONAL DEBRIDEMENT WOUND;  Surgeon: Lyndel Deward PARAS, MD;  Location: [facility] OR;  Service: General;  Laterality: N/A;  EXCISIONAL DEBRIDEMENT   INCISION AND DRAINAGE PERIRECTAL ABSCESS N/A 08/30/2024   Procedure: INCISION AND DRAINAGE OF PERINEAL WOUND;  Surgeon: Polly Cordella LABOR, MD;  Location: [facility] OR;  Service: General;   Laterality: N/A;   IR REMOVAL TUN CV CATH W/O FL  11/13/2024   IR TUNNELED CENTRAL VENOUS CATH PLC W IMG  09/18/2024   IR TUNNELED CENTRAL VENOUS CATH PLC W IMG  09/22/2024   IR TUNNELED CENTRAL VENOUS CATH PLC W IMG  11/17/2024   lap band surgery  2009   left inguinal hernia repair  1998   LIPOMA EXCISION Left 04/17/2023   Procedure: MINOR EXCISION LEFT BUTTOCK SEBACEOUS CYST;  Surgeon: Lyndel Deward PARAS, MD;  Location: [facility];  Service: General;  Laterality: Left;   robotic prostatectomy  2008   TRANSESOPHAGEAL ECHOCARDIOGRAM (CATH LAB) N/A 11/18/2024   Procedure: TRANSESOPHAGEAL ECHOCARDIOGRAM;  Surgeon: Santo Stanly LABOR, MD;  Location: [facility];  Service: Cardiovascular;  Laterality: N/A;   Patient Active Problem List   Diagnosis Date Noted   Vestibulopathy 12/09/2024   Debility 11/23/2024   Bloodstream infection due to central venous catheter 11/19/2024   Fever, unknown origin 11/18/2024   MSSA bacteremia 11/18/2024   Bacteremia 11/18/2024   Psoriasis 11/18/2024   Hypotension 11/11/2024   History of traumatic brain injury 11/11/2024   Heart failure with preserved ejection fraction (HCC) 11/11/2024   Open wound 11/11/2024   TBI (traumatic brain injury) (HCC) 11/03/2024   Acute on chronic heart failure with preserved ejection fraction (HFpEF, >= 50%) (HCC) 09/15/2024   Hyperlipidemia associated  with type 2 diabetes mellitus (HCC) 09/15/2024   Anemia of chronic disease 09/15/2024   Sepsis (HCC) 08/29/2024   Necrotizing fasciitis (HCC) 08/28/2024   Hx of adenomatous colonic polyps 08/26/2023   Research study patient 01/09/2023   Acute on chronic congestive heart failure (HCC) 01/09/2023   ESRD on dialysis (HCC) 01/08/2023   Heart failure, systolic, acute (HCC) 03/26/2020   Acute on chronic diastolic CHF (congestive heart failure) (HCC) 03/25/2020   Hypokalemia 03/25/2020   Leukocytosis 03/25/2020   Renal insufficiency 03/25/2020   Chest  pain 03/25/2020   Hypothyroidism 03/25/2020   Gout attack 03/25/2020   Morbid obesity (HCC) 10/28/2015   Internal hemorrhoids with bleeding 07/31/2013   Insulin -requiring or dependent type II diabetes mellitus (HCC) 01/11/2010   OSA (obstructive sleep apnea) 01/11/2010   Hypertension associated with diabetes (HCC) 01/11/2010   GLAUCOMA 01/10/2010   Allergic rhinitis 01/10/2010   PROSTATE CANCER, HX OF 01/10/2010    PCP: Rexanne Ingle, MD REFERRING PROVIDER: Maurice Sharlet RAMAN, PA-C  REFERRING DIAG:  [id number] (ICD-10-CM) - Traumatic brain injury, without loss of consciousness, subsequent encounter    THERAPY DIAG:  BPPV (benign paroxysmal positional vertigo), right  Dizziness and giddiness  Unsteadiness on feet  Difficulty in walking, not elsewhere classified  Other low back pain  ONSET DATE: 12/16-17/25  Rationale for Evaluation and Treatment: Rehabilitation  SUBJECTIVE:   SUBJECTIVE STATEMENT: Pt with hx of falling and suffering SDH after fall on stairs and hospitalized and then transferred to inpatient rehab and has now ben home x 11 days.  Since returning home his LBP has since flared causing increased dysfunction and reduced mobility/activity tolerance. Notes the dizziness is worsened with supine to sit (reports spinning), denies any HA or photo/phonophobia, no tinnitus, no issue with Valsalva reported, notes some blurriness with vision but not too significant Pt accompanied by: significant other and Robin Brunetti  PERTINENT HISTORY: 69 y.o. male with history of T2DM, cervical and lumbar radiculopathy with neuropathy, fatty liver, atopic dermatitis, ESRD with recent transition to HD (M/W/F) , Fournier's gangrene perineum and scrotum status post I&D 08/2024 and forearm abscess status close I&D 10/22/2024, recent fall 11/03/2024 where he struck his head and found to have right frontal meningioma, posterior scalp with precentral scalp hematoma and discharged to home on 12/18.  He  was readmitted on 12/24 sustaining 5 with DKA, hypotension, malaise and fever due to sepsis.  He was found to have MSSA and Proteus bacteremia felt to be due to line infection.  left posteriorlateral vestibulopathy  PAIN:  Are you having pain? Yes: NPRS scale: 10/10 Pain location: low back Pain description: ache/sharp Aggravating factors: movement Relieving factors: rest, slow movement, laying down, lidocaine  patch  PRECAUTIONS: Fall  RED FLAGS: Cauda equina syndrome: No--reports weakness/numbness but denies any parasthesias or loss of bowel/bladder   WEIGHT BEARING RESTRICTIONS: No  FALLS: Has patient fallen in last 6 months? Yes. Number of falls 3  LIVING ENVIRONMENT: Lives with: lives with their spouse Lives in: House/apartment Stairs: 4 stairs to enter Has following equipment at home: Single point cane, Environmental Consultant - 2 wheeled, Environmental Consultant - 4 wheeled, and Grab bars  PLOF: Independent  PATIENT GOALS: improve function  OBJECTIVE:  Note: Objective measures were completed at Evaluation unless otherwise noted.  DIAGNOSTIC FINDINGS: imaging following   COGNITION: Overall cognitive status: Within functional limits for tasks assessed   SENSATION: WFL  EDEMA:    MUSCLE TONE:  NT  DTRs:  NT  POSTURE:  forward head  Cervical ROM:  Active A/PROM (deg) eval  Flexion WFL  Extension 40  Right lateral flexion 35  Left lateral flexion 35  Right rotation 45  Left rotation 45  (Blank rows = not tested)  STRENGTH: NT  LOWER EXTREMITY MMT:   MMT Right eval Left eval  Hip flexion    Hip abduction    Hip adduction    Hip internal rotation    Hip external rotation    Knee flexion    Knee extension    Ankle dorsiflexion    Ankle plantarflexion    Ankle inversion    Ankle eversion    (Blank rows = not tested)  BED MOBILITY:  Sit to supine Min A Supine to sit Mod A Rolling to Right Mod A Rolling to Left Mod A  TRANSFERS: Assistive device utilized: Environmental Consultant -  2 wheeled  Sit to stand: Mod A--elevated seat height Stand to sit: Min A Chair to chair: Min A Floor: NT  RAMP: NT  CURB: NT  GAIT: Gait pattern: trunk flexed Distance walked: 15 ft Assistive device utilized: Environmental Consultant - 2 wheeled Level of assistance: CGA Comments: wide BOS  FUNCTIONAL TESTS:  5 times sit to stand: TBD Timed up and go (TUG): TBD 10 meter walk test: TBD    VESTIBULAR ASSESSMENT:  GENERAL OBSERVATION: very uncomfortable frequently changing position due to low back pain   SYMPTOM BEHAVIOR:  Subjective history: Mid-December fall on stairs striking head suffering SDH with dizziness since outset  Non-Vestibular symptoms: has been experiencing nausea and dry heaving but mainly from pain/medication  Type of dizziness: Imbalance (Disequilibrium) and Lightheadedness/Faint  Frequency: daily, but varies with position/activity  Duration: 2-3 min  Aggravating factors: supine to sit  Relieving factors: rest and slow movements  Progression of symptoms: unchanged  OCULOMOTOR EXAM:  Ocular Alignment: normal  Ocular ROM: No Limitations  Spontaneous Nystagmus: absent  Gaze-Induced Nystagmus: absent  Smooth Pursuits: intact  Saccades: hypermetric/overshoots  Convergence/Divergence: 3 cm   Cover-cross-cover test: Normal    VESTIBULAR - OCULAR REFLEX:   Slow VOR: Normal  VOR Cancellation: Normal  Head-Impulse Test: HIT Right: negative HIT Left: negative  Dynamic Visual Acuity: Not able to be assessed   POSITIONAL TESTING: Right Dix-Hallpike: upbeating, right nystagmus Left Dix-Hallpike: unable to test Right Roll Test: dizziness but no nystagmus observed, resolved after 5-10 sec Left Roll Test: no nystagmus Right upbeating nystagmus w/ R Dix-Hallpike x 15 sec  MOTION SENSITIVITY:  Motion Sensitivity Quotient Intensity: 0 = none, 1 = Lightheaded, 2 = Mild, 3 = Moderate, 4 = Severe, 5 = Vomiting  Intensity  1. Sitting to supine   2. Supine to L side   3.  Supine to R side   4. Supine to sitting   5. L Hallpike-Dix   6. Up from L    7. R Hallpike-Dix   8. Up from R    9. Sitting, head tipped to L knee   10. Head up from L knee   11. Sitting, head tipped to R knee   12. Head up from R knee   13. Sitting head turns x5   14.Sitting head nods x5   15. In stance, 180 turn to L    16. In stance, 180 turn to R     OTHOSTATICS: not done  FUNCTIONAL GAIT:  TREATMENT DATE: 12/17/24   Canalith Repositioning:  Epley Right: Comment: able to complete 1-rep requiring mod A for maneuvering   PATIENT EDUCATION: Education details: assessment details, rationale of intervention, and post-CRM considerations. Discussed also outline for physical rehab for his mobility impairments and back pain Person educated: Patient and Spouse Education method: Explanation Education comprehension: verbalized understanding  HOME EXERCISE PROGRAM:  GOALS: Goals reviewed with patient? Yes  SHORT TERM GOALS: Target date: 01/14/2025    Patient will be independent in HEP to improve functional outcomes Baseline: Goal status: INITIAL  2.  Bed mobility w/ modified independence to improve mobility/reduce level of caregiver assist Baseline: min-mod A Goal status: INITIAL  3.  Functional transfers w/ modified independence to improve mobility/reduce level of assistance from caregiver Baseline: min-mod A Goal status: INITIAL  4.  Pt to be free of positional dizziness (BPPV) to improve mobility and reduce risk for imbalance/falls Baseline: +R Dix-Hallpike (thus far) Goal status: INITIAL  5.  Ambulation x 150 ft w/ supervision using least restrictive AD to improve functional mobility Baseline: 15 ft CGA w/ RW Goal status: INITIAL  6.  Back pain not exceeding 5/10 with upright position/activity to improve ADL/mobility  participation Baseline: 10/10 Goal status: INITIAL  LONG TERM GOALS: Target date: 02/11/2025    Ambulation various surfaces modified independent and stair negotiation modified independent to improve safety in home/community Baseline: CGA x 15 ft Goal status: INITIAL  2.  TUG test Baseline: TBD Goal status: INITIAL  3.  5xSTS test Baseline: TBD Goal status: INITIAL  4.  10 meter walk test Baseline: TBD Goal status: INITIAL  5.  Back pain not exceeding 3/10 with functional mobility to improve activity tolerance/participation Baseline: 10/10 requiring physical assistance for mobility Goal status: INITIAL    ASSESSMENT:  CLINICAL IMPRESSION: Patient is a 69 y.o. male who was seen today for physical therapy evaluation and treatment for S06.9X0D (ICD-10-CM) - Traumatic brain injury, without loss of consciousness, subsequent encounter. Unfortunately, since returning home he has had flare-up of acute on chronic low back pain which has caused increased dysfunction and reduced activity tolerance and mobility currently requiring min-mod A for functional mobility and limited ambulation w/ physical support/guarding needed due to unsteadiness.  Pt has also been experiencing dizziness/balance deficits since initial injury. Oculomotor exam unrevealing and reassuring and minimal symptoms provoked to seated VOR and neck AROM.  Positional testing reveals some provocation to right rolling but no nystagmus observed and Right Dix-Hallpike revealing for right upbeating nystagmus of 15 sec duration and reproduction of vertigo.  Initiated treatment w/ Right Epley maneuver but unable to re-test due to time constraints.  Pt has several issues requiring Physical Therapy services and would benefit from ongoing sessions to address deficits and limitations to improve functional mobility and reduce risk for falls.    OBJECTIVE IMPAIRMENTS: Abnormal gait, decreased activity tolerance, decreased balance, decreased  endurance, decreased knowledge of use of DME, decreased mobility, difficulty walking, decreased strength, dizziness, postural dysfunction, and pain.   ACTIVITY LIMITATIONS: lifting, bending, sitting, standing, stairs, transfers, bed mobility, reach over head, and locomotion level  PARTICIPATION LIMITATIONS: meal prep, cleaning, laundry, interpersonal relationship, shopping, and community activity  PERSONAL FACTORS: Age, Time since onset of injury/illness/exacerbation, and 3+ comorbidities: extensive PMH are also affecting patient's functional outcome.   REHAB POTENTIAL: Good  CLINICAL DECISION MAKING: Unstable/unpredictable  EVALUATION COMPLEXITY: High   PLAN:  PT FREQUENCY: 2x/week  PT DURATION: 8 weeks  PLANNED INTERVENTIONS: 97750- Physical Performance Testing, 97110-Therapeutic exercises, 97530- Therapeutic activity,  02887- Neuromuscular re-education, [id number]- Self Care, 02859- Manual therapy, [id number]- Gait training, [id number]- Canalith repositioning, [id number]- Electrical stimulation (unattended), 20560 (1-2 muscles), 20561 (3+ muscles)- Dry Needling, and Patient/Family education  PLAN FOR NEXT SESSION: re-assess R DH and for L DH.  Initiate HEP for back pain and general mobility/exercise   9:17 AM, 12/17/24 M. Kelly Ernisha Sorn, PT, DPT Physical Therapist- [place] Office Number: [phone]  "

## 2024-12-18 ENCOUNTER — Telehealth: Payer: Self-pay | Admitting: Registered Nurse

## 2024-12-18 DIAGNOSIS — G894 Chronic pain syndrome: Secondary | ICD-10-CM

## 2024-12-18 DIAGNOSIS — M545 Low back pain, unspecified: Secondary | ICD-10-CM

## 2024-12-18 MED ORDER — TRAMADOL HCL 50 MG PO TABS: 50.0000 mg | ORAL_TABLET | Freq: Two times a day (BID) | ORAL | 0 refills | Status: AC | PRN

## 2024-12-18 NOTE — Telephone Encounter (Signed)
 Was in contact with Brendan [name] regarding Brendan [name] excruciating lower back pain. His oral swab results are pending. Brendan [name] was in agreement with Tramadol  at this time,. He is scheduled to see Brendan Bonner NP on 12/30/2024. Brendan [name] will keep this provider updated with the above.  If we continue to prescribe, Brendan [name] will consider Butrans Patch, especially with his ESRD on hemodialysis.  Call placed to Brendan [name] regarding the above, she is very adult nurse and verbalizes understanding. Brendan [name] will take Tramadol  after Dialysis on his dialysis days, she verbalizes understanding.

## 2024-12-19 LAB — DRUG TOX MONITOR 1 W/CONF, ORAL FLD
Amphetamines: NEGATIVE ng/mL
Barbiturates: NEGATIVE ng/mL
Benzodiazepines: NEGATIVE ng/mL
Buprenorphine: NEGATIVE ng/mL
Cocaine: NEGATIVE ng/mL
Fentanyl: NEGATIVE ng/mL
Heroin Metabolite: NEGATIVE ng/mL
MARIJUANA: NEGATIVE ng/mL
MDMA: NEGATIVE ng/mL
Meprobamate: NEGATIVE ng/mL
Methadone: NEGATIVE ng/mL
Nicotine Metabolite: NEGATIVE ng/mL
Opiates: NEGATIVE ng/mL
Phencyclidine: NEGATIVE ng/mL
Tapentadol: NEGATIVE ng/mL
Tramadol: NEGATIVE ng/mL
Zolpidem: NEGATIVE ng/mL

## 2024-12-19 LAB — DRUG TOX ALC METAB W/CON, ORAL FLD: Alcohol Metabolite: NEGATIVE ng/mL

## 2024-12-20 ENCOUNTER — Inpatient Hospital Stay (HOSPITAL_COMMUNITY)

## 2024-12-20 ENCOUNTER — Encounter (HOSPITAL_COMMUNITY): Payer: Self-pay

## 2024-12-20 ENCOUNTER — Emergency Department (HOSPITAL_COMMUNITY)

## 2024-12-20 ENCOUNTER — Inpatient Hospital Stay (HOSPITAL_COMMUNITY)
Admission: EM | Admit: 2024-12-20 | Source: Home / Self Care | Attending: Internal Medicine | Admitting: Internal Medicine

## 2024-12-20 DIAGNOSIS — B9561 Methicillin susceptible Staphylococcus aureus infection as the cause of diseases classified elsewhere: Secondary | ICD-10-CM | POA: Diagnosis present

## 2024-12-20 DIAGNOSIS — M899 Disorder of bone, unspecified: Secondary | ICD-10-CM

## 2024-12-20 DIAGNOSIS — E785 Hyperlipidemia, unspecified: Secondary | ICD-10-CM

## 2024-12-20 DIAGNOSIS — E1159 Type 2 diabetes mellitus with other circulatory complications: Secondary | ICD-10-CM | POA: Diagnosis not present

## 2024-12-20 DIAGNOSIS — J189 Pneumonia, unspecified organism: Secondary | ICD-10-CM | POA: Diagnosis present

## 2024-12-20 DIAGNOSIS — N186 End stage renal disease: Secondary | ICD-10-CM

## 2024-12-20 DIAGNOSIS — M4620 Osteomyelitis of vertebra, site unspecified: Secondary | ICD-10-CM | POA: Diagnosis present

## 2024-12-20 DIAGNOSIS — J810 Acute pulmonary edema: Secondary | ICD-10-CM

## 2024-12-20 DIAGNOSIS — R7881 Bacteremia: Secondary | ICD-10-CM | POA: Diagnosis not present

## 2024-12-20 DIAGNOSIS — R7989 Other specified abnormal findings of blood chemistry: Secondary | ICD-10-CM | POA: Diagnosis not present

## 2024-12-20 DIAGNOSIS — Z992 Dependence on renal dialysis: Secondary | ICD-10-CM

## 2024-12-20 DIAGNOSIS — E440 Moderate protein-calorie malnutrition: Secondary | ICD-10-CM

## 2024-12-20 DIAGNOSIS — E039 Hypothyroidism, unspecified: Secondary | ICD-10-CM | POA: Diagnosis not present

## 2024-12-20 DIAGNOSIS — I152 Hypertension secondary to endocrine disorders: Secondary | ICD-10-CM | POA: Diagnosis present

## 2024-12-20 DIAGNOSIS — D638 Anemia in other chronic diseases classified elsewhere: Secondary | ICD-10-CM | POA: Diagnosis not present

## 2024-12-20 DIAGNOSIS — E1169 Type 2 diabetes mellitus with other specified complication: Secondary | ICD-10-CM | POA: Diagnosis not present

## 2024-12-20 DIAGNOSIS — I5033 Acute on chronic diastolic (congestive) heart failure: Secondary | ICD-10-CM | POA: Diagnosis present

## 2024-12-20 DIAGNOSIS — S22089A Unspecified fracture of T11-T12 vertebra, initial encounter for closed fracture: Secondary | ICD-10-CM

## 2024-12-20 DIAGNOSIS — Z87820 Personal history of traumatic brain injury: Secondary | ICD-10-CM

## 2024-12-20 DIAGNOSIS — E66811 Obesity, class 1: Secondary | ICD-10-CM | POA: Diagnosis present

## 2024-12-20 DIAGNOSIS — Z794 Long term (current) use of insulin: Secondary | ICD-10-CM

## 2024-12-20 DIAGNOSIS — M545 Low back pain, unspecified: Principal | ICD-10-CM

## 2024-12-20 DIAGNOSIS — E119 Type 2 diabetes mellitus without complications: Secondary | ICD-10-CM | POA: Diagnosis not present

## 2024-12-20 LAB — COMPREHENSIVE METABOLIC PANEL WITH GFR
ALT: <5 U/L (ref 0–44)
AST: 11 U/L — ABNORMAL LOW (ref 15–41)
Albumin: 2.8 g/dL — ABNORMAL LOW (ref 3.5–5.0)
Alkaline Phosphatase: 104 U/L (ref 38–126)
Anion gap: 13 (ref 5–15)
BUN: 26 mg/dL — ABNORMAL HIGH (ref 8–23)
CO2: 28 mmol/L (ref 22–32)
Calcium: 8.6 mg/dL — ABNORMAL LOW (ref 8.9–10.3)
Chloride: 90 mmol/L — ABNORMAL LOW (ref 98–111)
Creatinine, Ser: 5.12 mg/dL — ABNORMAL HIGH (ref 0.61–1.24)
GFR, Estimated: 11 mL/min — ABNORMAL LOW
Glucose, Bld: 270 mg/dL — ABNORMAL HIGH (ref 70–99)
Potassium: 3.7 mmol/L (ref 3.5–5.1)
Sodium: 131 mmol/L — ABNORMAL LOW (ref 135–145)
Total Bilirubin: 0.5 mg/dL (ref 0.0–1.2)
Total Protein: 7.6 g/dL (ref 6.5–8.1)

## 2024-12-20 LAB — HEMOGLOBIN A1C
Hgb A1c MFr Bld: 8.3 % — ABNORMAL HIGH (ref 4.8–5.6)
Mean Plasma Glucose: 191.51 mg/dL

## 2024-12-20 LAB — TROPONIN T, HIGH SENSITIVITY
Troponin T High Sensitivity: 118 ng/L (ref 0–19)
Troponin T High Sensitivity: 116 ng/L (ref 0–19)

## 2024-12-20 LAB — CBC WITH DIFFERENTIAL/PLATELET
Abs Immature Granulocytes: 0.08 10*3/uL — ABNORMAL HIGH (ref 0.00–0.07)
Basophils Absolute: 0.1 10*3/uL (ref 0.0–0.1)
Basophils Relative: 1 %
Eosinophils Absolute: 0.6 10*3/uL — ABNORMAL HIGH (ref 0.0–0.5)
Eosinophils Relative: 4 %
HCT: 27.8 % — ABNORMAL LOW (ref 39.0–52.0)
Hemoglobin: 9 g/dL — ABNORMAL LOW (ref 13.0–17.0)
Immature Granulocytes: 1 %
Lymphocytes Relative: 6 %
Lymphs Abs: 0.7 10*3/uL (ref 0.7–4.0)
MCH: 26.8 pg (ref 26.0–34.0)
MCHC: 32.4 g/dL (ref 30.0–36.0)
MCV: 82.7 fL (ref 80.0–100.0)
Monocytes Absolute: 1 10*3/uL (ref 0.1–1.0)
Monocytes Relative: 8 %
Neutro Abs: 10 10*3/uL — ABNORMAL HIGH (ref 1.7–7.7)
Neutrophils Relative %: 80 %
Platelets: 410 10*3/uL — ABNORMAL HIGH (ref 150–400)
RBC: 3.36 MIL/uL — ABNORMAL LOW (ref 4.22–5.81)
RDW: 17.4 % — ABNORMAL HIGH (ref 11.5–15.5)
WBC: 12.4 10*3/uL — ABNORMAL HIGH (ref 4.0–10.5)
nRBC: 0 % (ref 0.0–0.2)

## 2024-12-20 LAB — T4, FREE: Free T4: 1.11 ng/dL (ref 0.80–2.00)

## 2024-12-20 LAB — C-REACTIVE PROTEIN: CRP: 15.3 mg/dL — ABNORMAL HIGH

## 2024-12-20 LAB — I-STAT CHEM 8, ED
BUN: 35 mg/dL — ABNORMAL HIGH (ref 8–23)
Calcium, Ion: 1 mmol/L — ABNORMAL LOW (ref 1.15–1.40)
Chloride: 91 mmol/L — ABNORMAL LOW (ref 98–111)
Creatinine, Ser: 5.6 mg/dL — ABNORMAL HIGH (ref 0.61–1.24)
Glucose, Bld: 272 mg/dL — ABNORMAL HIGH (ref 70–99)
HCT: 28 % — ABNORMAL LOW (ref 39.0–52.0)
Hemoglobin: 9.5 g/dL — ABNORMAL LOW (ref 13.0–17.0)
Potassium: 4.4 mmol/L (ref 3.5–5.1)
Sodium: 131 mmol/L — ABNORMAL LOW (ref 135–145)
TCO2: 31 mmol/L (ref 22–32)

## 2024-12-20 LAB — HEPATITIS B SURFACE ANTIGEN: Hepatitis B Surface Ag: NONREACTIVE

## 2024-12-20 LAB — IRON AND TIBC
Iron: 32 ug/dL — ABNORMAL LOW (ref 45–182)
Saturation Ratios: 19 % (ref 17.9–39.5)
TIBC: 165 ug/dL — ABNORMAL LOW (ref 250–450)
UIBC: 133 ug/dL

## 2024-12-20 LAB — PRO BRAIN NATRIURETIC PEPTIDE: Pro Brain Natriuretic Peptide: >35000 pg/mL — ABNORMAL HIGH

## 2024-12-20 LAB — PROCALCITONIN: Procalcitonin: 2.43 ng/mL

## 2024-12-20 LAB — RESP PANEL BY RT-PCR (RSV, FLU A&B, COVID)  RVPGX2
Influenza A by PCR: NEGATIVE
Influenza B by PCR: NEGATIVE
Resp Syncytial Virus by PCR: NEGATIVE
SARS Coronavirus 2 by RT PCR: NEGATIVE

## 2024-12-20 LAB — CBG MONITORING, ED: Glucose-Capillary: 255 mg/dL — ABNORMAL HIGH (ref 70–99)

## 2024-12-20 LAB — TSH: TSH: 3.33 u[IU]/mL (ref 0.350–4.500)

## 2024-12-20 LAB — FERRITIN: Ferritin: 798 ng/mL — ABNORMAL HIGH (ref 24–336)

## 2024-12-20 MED ORDER — HYDROMORPHONE HCL 1 MG/ML IJ SOLN
INTRAMUSCULAR | Status: AC
  Filled 2024-12-20: qty 1

## 2024-12-20 MED ORDER — ONDANSETRON HCL 4 MG/2ML IJ SOLN
4.0000 mg | Freq: Once | INTRAMUSCULAR | Status: AC
  Administered 2024-12-20: 4 mg via INTRAVENOUS
  Filled 2024-12-20: qty 2

## 2024-12-20 MED ORDER — GADOBUTROL 1 MMOL/ML IV SOLN
10.0000 mL | Freq: Once | INTRAVENOUS | Status: AC | PRN
  Administered 2024-12-20: 10 mL via INTRAVENOUS

## 2024-12-20 MED ORDER — MORPHINE SULFATE (PF) 4 MG/ML IV SOLN
4.0000 mg | Freq: Once | INTRAVENOUS | Status: AC
  Administered 2024-12-20: 4 mg via INTRAVENOUS
  Filled 2024-12-20: qty 1

## 2024-12-20 MED ORDER — TRAMADOL HCL 50 MG PO TABS
50.0000 mg | ORAL_TABLET | Freq: Four times a day (QID) | ORAL | Status: AC | PRN
  Administered 2024-12-24 – 2024-12-25 (×5): 50 mg via ORAL
  Filled 2024-12-20 (×5): qty 1

## 2024-12-20 MED ORDER — DARBEPOETIN ALFA 150 MCG/0.3ML IJ SOSY
150.0000 ug | PREFILLED_SYRINGE | INTRAMUSCULAR | Status: AC
  Administered 2024-12-23: 150 ug via SUBCUTANEOUS
  Filled 2024-12-20: qty 0.3

## 2024-12-20 MED ORDER — LORAZEPAM 2 MG/ML IJ SOLN
1.0000 mg | Freq: Once | INTRAMUSCULAR | Status: AC
  Administered 2024-12-20: 1 mg via INTRAVENOUS
  Filled 2024-12-20: qty 1

## 2024-12-20 MED ORDER — CHLORHEXIDINE GLUCONATE CLOTH 2 % EX PADS
6.0000 | MEDICATED_PAD | Freq: Every day | CUTANEOUS | Status: DC
  Administered 2024-12-24: 6 via TOPICAL

## 2024-12-20 MED ORDER — MORPHINE SULFATE (PF) 2 MG/ML IV SOLN
2.0000 mg | INTRAVENOUS | Status: DC | PRN
  Administered 2024-12-20: 4 mg via INTRAVENOUS
  Filled 2024-12-20: qty 2

## 2024-12-20 MED ORDER — HEPARIN SODIUM (PORCINE) 1000 UNIT/ML IJ SOLN
INTRAMUSCULAR | Status: AC
  Filled 2024-12-20: qty 4

## 2024-12-20 MED ORDER — INSULIN ASPART 100 UNIT/ML IJ SOLN
0.0000 [IU] | INTRAMUSCULAR | Status: DC
  Administered 2024-12-20: 3 [IU] via SUBCUTANEOUS
  Administered 2024-12-21 (×2): 1 [IU] via SUBCUTANEOUS
  Administered 2024-12-22: 2 [IU] via SUBCUTANEOUS
  Administered 2024-12-22: 4 [IU] via SUBCUTANEOUS
  Administered 2024-12-22: 3 [IU] via SUBCUTANEOUS
  Administered 2024-12-22: 4 [IU] via SUBCUTANEOUS
  Administered 2024-12-22: 2 [IU] via SUBCUTANEOUS
  Administered 2024-12-23: 3 [IU] via SUBCUTANEOUS
  Administered 2024-12-23: 4 [IU] via SUBCUTANEOUS
  Administered 2024-12-23: 3 [IU] via SUBCUTANEOUS
  Administered 2024-12-23 – 2024-12-24 (×3): 1 [IU] via SUBCUTANEOUS
  Administered 2024-12-24: 3 [IU] via SUBCUTANEOUS
  Administered 2024-12-24: 1 [IU] via SUBCUTANEOUS
  Filled 2024-12-20: qty 5
  Filled 2024-12-20 (×2): qty 3
  Filled 2024-12-20: qty 4
  Filled 2024-12-20: qty 1
  Filled 2024-12-20: qty 3
  Filled 2024-12-20: qty 2
  Filled 2024-12-20: qty 4
  Filled 2024-12-20: qty 3
  Filled 2024-12-20: qty 1
  Filled 2024-12-20: qty 3
  Filled 2024-12-20: qty 1
  Filled 2024-12-20: qty 3
  Filled 2024-12-20: qty 2
  Filled 2024-12-20: qty 1
  Filled 2024-12-20: qty 3

## 2024-12-20 MED ORDER — CALCITRIOL 0.25 MCG PO CAPS
0.2500 ug | ORAL_CAPSULE | ORAL | Status: AC
  Administered 2024-12-21 – 2024-12-23 (×3): 0.25 ug via ORAL
  Filled 2024-12-20 (×3): qty 1

## 2024-12-20 MED ORDER — HYDROMORPHONE HCL 1 MG/ML IJ SOLN
0.5000 mg | INTRAMUSCULAR | Status: AC | PRN
  Administered 2024-12-20 – 2024-12-25 (×9): 1 mg via INTRAVENOUS
  Filled 2024-12-20 (×9): qty 1

## 2024-12-20 MED ORDER — SODIUM CHLORIDE 0.9 % IV SOLN
2.0000 g | Freq: Once | INTRAVENOUS | Status: AC
  Administered 2024-12-20: 2 g via INTRAVENOUS
  Filled 2024-12-20: qty 12.5

## 2024-12-20 MED ORDER — VANCOMYCIN HCL 2000 MG/400ML IV SOLN
2000.0000 mg | Freq: Once | INTRAVENOUS | Status: AC
  Administered 2024-12-20: 2000 mg via INTRAVENOUS
  Filled 2024-12-20: qty 400

## 2024-12-20 MED ORDER — IOHEXOL 350 MG/ML SOLN
80.0000 mL | Freq: Once | INTRAVENOUS | Status: AC | PRN
  Administered 2024-12-20: 80 mL via INTRAVENOUS

## 2024-12-20 MED ORDER — SODIUM CHLORIDE 0.9 % IV SOLN
1.0000 g | INTRAVENOUS | Status: AC
  Administered 2024-12-21 – 2024-12-25 (×5): 1 g via INTRAVENOUS
  Filled 2024-12-20 (×7): qty 10

## 2024-12-20 MED ORDER — LIDOCAINE HCL (PF) 1 % IJ SOLN: 5.0000 mL | INTRAMUSCULAR | Status: DC | PRN
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
## 2024-12-20 NOTE — Assessment & Plan Note (Signed)
 Appreciate nephrology consult anticipate hemodialysis today or early tomorrow

## 2024-12-20 NOTE — Assessment & Plan Note (Signed)
 Hold blood pressure meds today as patient anticipated to have hemodialysis in the near future

## 2024-12-20 NOTE — ED Notes (Signed)
 Pt en route to dialysis by transport.

## 2024-12-20 NOTE — Assessment & Plan Note (Signed)
 -  Check TSH continue home medications Synthroid at  125 mcg po q day

## 2024-12-20 NOTE — ED Provider Notes (Signed)
 69 yo male here with back pain x several months Hx of ESRD on dialysis October admission for fourniere's gangrene December admission for traumatic brain injury Recent admission for MSSA bacteremia in January  Assumed care of patient from Dr Lenor  Patient also hypoxic requiring [place] at 6 L - nephrology aware of patient's likely need for dialysis, per Dr. Gardner discussion  Pending MRI L spine, CT imaging abdomen  Physical Exam  BP (!) 157/76   Pulse (!) 58   Temp 97.6 F (36.4 C) (Oral)   Resp (!) 24   SpO2 98%   Physical Exam  Procedures  Procedures  ED Course / MDM    Medical Decision Making Amount and/or Complexity of Data Reviewed Labs: ordered. Radiology: ordered.  Risk Prescription drug management. Decision regarding hospitalization.   MRI imaging concerning for inflammatory findings around T12 with likely pathologic fracture.  I spoke to the radiologist who also reported concern about likely metastatic disease.  BS antibiotics ordered per last blood cultures results and possible PNA coverage as well. Blood cultures ordered  I spoke to Dr Colon NSGY who recommends: consult pending; possible aspiration of spine but okay for antibiotics if needed for alternative reason (likely PNA)  Admitted to hospitalist Dr Duotova at 7 pm - still pending likely dialysis tonight or tomorrow as well     Cottie Donnice PARAS, MD 12/20/24 [phone]

## 2024-12-20 NOTE — Assessment & Plan Note (Signed)
" -   Order very Sensitive   SSI   - continue home insulin  but decreased to  15units,  -  check TSH and HgA1C  - Hold by mouth medications   "

## 2024-12-20 NOTE — ED Notes (Signed)
 Patient transported to MRI

## 2024-12-20 NOTE — Assessment & Plan Note (Signed)
 Possibility of underlying pneumonia For tonight cover cefepime  and vancomycin  Given recent admission and complex infectious history Respiratory cultures ordered Strep pneumo Legionella ordered Blood cultures ordered

## 2024-12-20 NOTE — Assessment & Plan Note (Signed)
Chronic stable continue to monitor

## 2024-12-20 NOTE — Assessment & Plan Note (Addendum)
 Abnormal MRI with findings of T12 destruction discitis versus osteomyelitis findings Unclear etiology infectious versus malignant neurosurgery consulted MRI also reading for possible septic facet arthropathy at other levels including L4-L5 L5-S1 Given also possibility of pneumonia he was already started on cefepime  vancomycin  will continue for now Blood cultures obtained Obtain sed rate CRP  In ER received dose of vancomycin  and cefepime  Lysle of her pain was chronic at first but suddenly change a past few days

## 2024-12-20 NOTE — Assessment & Plan Note (Signed)
 Elevated but stable in the setting of end-stage renal disease evidence of fluid overload Patient denies any chest pain per se Repeat echogram. Continue to monitor likely demand driven in the setting of above

## 2024-12-20 NOTE — Assessment & Plan Note (Signed)
Continue Zocor 20 mg a day.

## 2024-12-20 NOTE — Assessment & Plan Note (Signed)
 Obtain anemia panel  Transfuse for Hg <7 , rapidly dropping or  if symptomatic

## 2024-12-20 NOTE — Progress Notes (Addendum)
 Pharmacy Antibiotic Note  Brendan [name]. is a 69 y.o. male for which pharmacy has been consulted for cefepime  and vancomycin  dosing for pneumonia.  Patient with ESRD on HD MWF, HTN, DM, and TBI. Treated for Fournier's in October. Recent readmission with MSSA and proteus mirabilis -- discharged on levaquin  and cefazolin . Patient presenting with back pain.  WBC 12.4; T 97.6; HR 60; RR 18 COVID neg / flu neg  Plan: Vancomycin  2g in ED - further dosing pending HD schedule Cefepime  2g given x 1 in ED --plan for Cefepime  1g q24hr starting tomorrow (HD planning tonight) Monitor WBC, fever, renal function, cultures De-escalate when able F/u Nephrology plan F/u MRSA PCR  Height: 6' 2 (188 cm) Weight: 115.7 kg (255 lb) IBW/kg (Calculated) : 82.2  Temp (24hrs), Avg:97.6 F (36.4 C), Min:97.6 F (36.4 C), Max:97.6 F (36.4 C)  Recent Labs  Lab 12/20/24 1345 12/20/24 1411 12/20/24 1512  WBC 12.4*  --   --   CREATININE  --  5.60* 5.12*    Estimated Creatinine Clearance: 18.4 mL/min (A) (by C-G formula based on SCr of 5.12 mg/dL (H)).    Allergies[1]  Microbiology results: Pending  Thank you for allowing pharmacy to be a part of this patients care.  Dorn Buttner, PharmD, BCPS 12/20/2024 7:13 PM ED Clinical Pharmacist -  [phone]       [1]  Allergies Allergen Reactions   Ace Inhibitors Cough   Maxidex  [Dexamethasone ] Other (See Comments)    Sexual dysfunction   Verapamil Other (See Comments)    constipation

## 2024-12-20 NOTE — ED Provider Notes (Signed)
 " [place] [facility] DE[facility]MENT AT [facility] Provider Note   CSN: [id number] Arrival date & time: 12/20/24  1221     Patient presents with: Back Pain   Brendan [name]. is a 69 y.o. male.   Patient is a 69 year old male who presents with back pain.  He has a complex medical history.  He has a history of end-stage renal disease on dialysis Monday Wednesday Friday, hypertension, diabetes.  He was admitted in December after a fall with a traumatic brain injury.  He was treated in October for Fournier's gangrene.  He was readmitted on December 24 for generalized weakness and was found to be septic.  He had MSSA and Proteus Mirabella's bacteremia.  He was admitted to rehab and discharged on January 17 to home.  His wife said that he has been having back pain since October.  He actually had an MRI in October but they were not able to follow-up on the result because of the Fournier's gangrene admission.  They recently followed up with Dr. Burnetta at [facility] and was told he had spinal stenosis on the MRI.  He was prescribed tramadol  on Friday.  He has had worsening back pain over the last few days.  He now is not able to walk because of leg weakness.  He has intermittent numbness in his legs which she describes as a burning sensation.  He currently does not have the numbness.  No loss of bowel or bladder control.  No recent fevers.  He has been feeling short of breath over the last few days.  He did complete his dialysis 2 days ago.  He is not on home oxygen.  He was found to be hypoxic with EMS with an oxygen saturation of 80% on room air.  He is currently on a nasal cannula at 6 L/min.  He denies any cough or cold symptoms.  No fevers.  He has had some dry heaves and some abdominal pain as well.       Prior to Admission medications  Medication Sig Start Date End Date Taking? Authorizing Provider  acetaminophen  (TYLENOL ) 500 MG tablet Take 500 mg by mouth every 6 (six) hours as needed for  mild pain.    [provider]  amLODipine  (NORVASC ) 5 MG tablet Take 1 tablet (5 mg total) by mouth daily. 10/21/24     bimekizumab-bkzx (BIMZELX) 160 MG/ML prefilled syringe Inject 1 Syringe into the skin every 30 (thirty) days. 08/24/24   [provider]  calcitRIOL  (ROCALTROL ) 0.25 MCG capsule Take 1 capsule (0.25 mcg total) by mouth daily. 12/04/24   Love, Sharlet RAMAN, PA-C  carvedilol  (COREG ) 6.25 MG tablet Take 1 tablet (6.25 mg total) by mouth 2 (two) times daily with a meal. 12/04/24   Love, Sharlet RAMAN, PA-C  ceFAZolin  (ANCEF ) 2-4 GM/100ML-% IVPB Inject 100 mLs (2 g total) into the vein every Monday, Wednesday, and Friday. After dialysis 12/04/24   Love, Sharlet RAMAN, PA-C  Continuous Glucose Sensor (FREESTYLE LIBRE 3 PLUS SENSOR) MISC Apply to upper back of arm. Change every 15 days. 04/03/24     diclofenac  Sodium (VOLTAREN ) 1 % GEL Apply 4 g topically 4 (four) times daily. To shoulder 12/04/24   Love, Sharlet RAMAN, PA-C  dorzolamide  (TRUSOPT ) 2 % ophthalmic solution Place 1 drop into both eyes daily.    [provider]  hydrocerin (EUCERIN) CREA Apply 1 Application topically 2 (two) times daily. To bilateral hands and feet 12/04/24   Love, Sharlet RAMAN,  PA-C  insulin  glargine-yfgn (SEMGLEE , YFGN,) 100 UNIT/ML injection Inject 0.25 mLs (25 Units total) into the skin daily. 11/24/24   Rashid, Farhan, MD  insulin  lispro (HUMALOG ) 200 UNIT/ML KwikPen Inject 5 Units into the skin with breakfast, with lunch, and with evening meal. 11/23/24   Dino Antu, MD  Insulin  Pen Needle 32G X 4 MM MISC Use to inject insulin  up to 4 times daily. 12/26/23   Faythe Purchase, MD  levothyroxine  (SYNTHROID ) 125 MCG tablet Take 1 tablet (125 mcg total) by mouth every morning on an empty stomach 06/08/24     lidocaine  (LIDODERM ) 5 % Place 2 patches onto the skin daily. On for 12 hour and has to be off for at least 12 hours in between use. 12/04/24   Love, Sharlet RAMAN, PA-C  meclizine  (ANTIVERT ) 12.5 MG tablet Take 1 tablet  (12.5 mg total) by mouth 3 (three) times daily as needed for dizziness. 12/04/24   Love, Sharlet RAMAN, PA-C  scopolamine  (TRANSDERM-SCOP) 1 MG/3DAYS Place 1 patch (1 mg total) onto the skin every 3 (three) days. 12/05/24   Love, Sharlet RAMAN, PA-C  senna-docusate (SENOKOT-S) 8.6-50 MG tablet Take 1 tablet by mouth 2 (two) times daily. 12/04/24   Love, Sharlet RAMAN, PA-C  simvastatin  (ZOCOR ) 20 MG tablet Take 1 tablet (20 mg total) by mouth every evening. 07/09/24     timolol  (BETIMOL ) 0.5 % ophthalmic solution Place 1 drop into both eyes daily.    [provider]  tirzepatide  (MOUNJARO ) 15 MG/0.5ML Pen Inject 15 mg into the skin once a week. 04/14/24     tirzepatide  (MOUNJARO ) 5 MG/0.5ML Pen Inject 5 mg into the skin once a week. 12/08/24     traMADol  (ULTRAM ) 50 MG tablet Take 1 tablet (50 mg total) by mouth 2 (two) times daily as needed. 12/18/24   Debby Fidela CROME, NP    Allergies: Ace inhibitors, Maxidex  [dexamethasone ], and Verapamil    Review of Systems  Constitutional:  Positive for fatigue. Negative for chills, diaphoresis and fever.  HENT:  Negative for congestion, rhinorrhea and sneezing.   Eyes: Negative.   Respiratory:  Positive for shortness of breath. Negative for cough and chest tightness.   Cardiovascular:  Negative for chest pain and leg swelling.  Gastrointestinal:  Positive for abdominal pain, nausea and vomiting. Negative for diarrhea.  Genitourinary:        He does not produce urine  Musculoskeletal:  Positive for back pain. Negative for arthralgias.  Skin:  Negative for rash.  Neurological:  Positive for weakness and numbness. Negative for dizziness, speech difficulty and headaches.    Updated Vital Signs BP (!) 157/76   Pulse (!) 58   Temp 97.6 F (36.4 C) (Oral)   Resp (!) 24   SpO2 98%   Physical Exam Constitutional:      Appearance: He is well-developed. He is ill-appearing.  HENT:     Head: Normocephalic and atraumatic.  Eyes:     Pupils: Pupils are equal,  round, and reactive to light.  Cardiovascular:     Rate and Rhythm: Normal rate and regular rhythm.     Heart sounds: Normal heart sounds.  Pulmonary:     Effort: Pulmonary effort is normal. No respiratory distress.     Breath sounds: Normal breath sounds. No wheezing or rales.  Chest:     Chest wall: No tenderness.  Abdominal:     General: Bowel sounds are normal.     Palpations: Abdomen is soft.  Tenderness: There is abdominal tenderness (Generalized tenderness, more on the right side). There is no guarding or rebound.  Musculoskeletal:        General: Normal range of motion.     Cervical back: Normal range of motion and neck supple.     Comments: Positive tenderness to the mid lumbar spine.  There is also tenderness to the left side.  There is little bit of induration in this area although I am not sure if this is chronic skin changes for him.  No warmth or erythema  Lymphadenopathy:     Cervical: No cervical adenopathy.  Skin:    General: Skin is warm and dry.     Findings: No rash.  Neurological:     Mental Status: He is alert and oriented to person, place, and time.     (all labs ordered are listed, but only abnormal results are displayed) Labs Reviewed  RESP PANEL BY RT-PCR (RSV, FLU A&B, COVID)  [id number]  COMPREHENSIVE METABOLIC PANEL WITH GFR  CBC WITH DIFFERENTIAL/PLATELET  PRO BRAIN NATRIURETIC PEPTIDE  TROPONIN T, HIGH SENSITIVITY    EKG: None  Radiology: No results found.   Procedures   Medications Ordered in the ED  LORazepam  (ATIVAN ) injection 1 mg (has no administration in time range)  morphine  (PF) 4 MG/ML injection 4 mg (has no administration in time range)                                    Medical Decision Making Amount and/or Complexity of Data Reviewed Labs: ordered. Radiology: ordered.  Risk Prescription drug management.   This patient presents to the ED for concern of back pain, shortness of breath, this involves an extensive  number of treatment options, and is a complaint that carries with it a high risk of complications and morbidity.  I considered the following differential and admission for this acute, potentially life threatening condition.  The differential diagnosis includes musculoskeletal pain, pneumonia, pulmonary edema, PE, discitis, cauda equina, epidural abscess/hematoma  MDM:    Patient is a 69 year old who presents with worsening back pain.  He also has shortness of breath and abdominal pain with vomiting.  He is noted to be hypoxic but maintaining good oxygen saturations on a nasal cannula at 6 L/min.  His chest x-ray shows evidence of pulmonary edema.  I-STAT shows a normal potassium.  Discussed with Dr. Dolan with nephrology who will try to arrange emergent dialysis.  He is awaiting CT abdomen pelvis as well as MRI of the spine.  Care turned over to Dr. Cottie.  Patient will need admission.  (Labs, imaging, consults)  Labs: I Ordered, and personally interpreted labs.  The pertinent results include: Elevated WBC count, anemia but similar to prior values, elevated creatinine, potassium normal  Imaging Studies ordered: I ordered imaging studies including chest x-ray I independently visualized and interpreted imaging. I agree with the radiologist interpretation  Additional history obtained from wife at bedside.  External records from outside source obtained and reviewed including prior notes  Cardiac Monitoring: The patient was maintained on a cardiac monitor.  If on the cardiac monitor, I personally viewed and interpreted the cardiac monitored which showed an underlying rhythm of: Sinus rhythm  Reevaluation: After the interventions noted above, I reevaluated the patient and found that they have :improved  Social Determinants of Health:    Disposition: Admit to hospital  Co morbidities that complicate the  patient evaluation  Past Medical History:  Diagnosis Date   Acanthosis nigricans     Atopic dermatitis    CHF (congestive heart failure) (HCC)    CKD (chronic kidney disease) stage 4, GFR 15-29 ml/min (HCC) 01/08/2023   Diabetes (HCC) 11/19/2002   Erectile dysfunction    Fatty liver 11/19/2005   Glaucoma 11/19/2006   Gynecomastia 11/20/2007   Hx of adenomatous colonic polyps 08/26/2023   Hyperaldosteronism 11/19/1998   Hypercholesterolemia    Hyperlipidemia 2010   Hypertension 1999   Hypoglycemic reaction    Hypothyroidism 11/19/2002   Incontinence    Intermittent vertigo 11/19/2010   Left cervical radiculopathy 11/19/2010   Lumbar radiculopathy    Obesity    Peripheral neuropathy    Pollen allergies 11/19/2005   perennial   Prostate cancer (HCC) 11/19/2004   Reflux esophagitis 11/19/1993   Sickle cell trait 11/19/2004   Sleep apnea, obstructive 11/19/1998   uses a cpap   Venous insufficiency 11/20/2003   Vitamin D deficiency 11/19/2010     Medicines Meds ordered this encounter  Medications   LORazepam  (ATIVAN ) injection 1 mg   morphine  (PF) 4 MG/ML injection 4 mg    I have reviewed the patients home medicines and have made adjustments as needed  Problem List / ED Course: Problem List Items Addressed This Visit   None            Final diagnoses:  None    ED Discharge Orders     None          Lenor Hollering, MD 12/20/24 1522  "

## 2024-12-20 NOTE — Assessment & Plan Note (Signed)
 Manage fluid status as per nephrology with hemodialysis Obtain echogram

## 2024-12-20 NOTE — Assessment & Plan Note (Signed)
 Repeat blood cultures

## 2024-12-20 NOTE — ED Notes (Signed)
 Repeat labs sent for CMP, Trop

## 2024-12-20 NOTE — Assessment & Plan Note (Signed)
 Contributing to comorbidity and complicating medical management  Body mass index is 32.74 kg/m.  Nutritional follow up as an out pt would be recommended

## 2024-12-20 NOTE — ED Triage Notes (Addendum)
 Pt BIB [facility] from home c/o lower back pain. Has hx of spinal stenosis, has been taking his prescribed tramdol without relief. Due to uncontrollable pain,pt has been not been mobile since Friday night, laying in bed, refuses to sit up. C/o [facility] but states it is related to pain, 80% on room air upon EMS arrival, 90% on 6L. Lungs clear per EMS. Pt dry heaving, wife reports pt has been dry heaving for the past week.   188/110 HR 70 CBG 303 Last dialysis Friday

## 2024-12-20 NOTE — ED Notes (Signed)
 Patient currently at MRI.

## 2024-12-21 ENCOUNTER — Inpatient Hospital Stay (HOSPITAL_COMMUNITY)

## 2024-12-21 ENCOUNTER — Other Ambulatory Visit: Payer: Self-pay

## 2024-12-21 ENCOUNTER — Other Ambulatory Visit (HOSPITAL_COMMUNITY): Payer: Self-pay

## 2024-12-21 DIAGNOSIS — I5033 Acute on chronic diastolic (congestive) heart failure: Secondary | ICD-10-CM

## 2024-12-21 LAB — CBC
HCT: 26.4 % — ABNORMAL LOW (ref 39.0–52.0)
Hemoglobin: 8.5 g/dL — ABNORMAL LOW (ref 13.0–17.0)
MCH: 27 pg (ref 26.0–34.0)
MCHC: 32.2 g/dL (ref 30.0–36.0)
MCV: 83.8 fL (ref 80.0–100.0)
Platelets: 409 10*3/uL — ABNORMAL HIGH (ref 150–400)
RBC: 3.15 MIL/uL — ABNORMAL LOW (ref 4.22–5.81)
RDW: 17.3 % — ABNORMAL HIGH (ref 11.5–15.5)
WBC: 12.2 10*3/uL — ABNORMAL HIGH (ref 4.0–10.5)
nRBC: 0 % (ref 0.0–0.2)

## 2024-12-21 LAB — CBG MONITORING, ED
Glucose-Capillary: 166 mg/dL — ABNORMAL HIGH (ref 70–99)
Glucose-Capillary: 120 mg/dL — ABNORMAL HIGH (ref 70–99)
Glucose-Capillary: 119 mg/dL — ABNORMAL HIGH (ref 70–99)

## 2024-12-21 LAB — ECHOCARDIOGRAM COMPLETE
Area-P 1/2: 3.37 cm2
Calc EF: 52.4 %
Height: 74 in
S' Lateral: 4.9 cm
Single Plane A2C EF: 48.1 %
Single Plane A4C EF: 55.4 %
Weight: 4080 [oz_av]

## 2024-12-21 LAB — GLUCOSE, CAPILLARY
Glucose-Capillary: 197 mg/dL — ABNORMAL HIGH (ref 70–99)
Glucose-Capillary: 285 mg/dL — ABNORMAL HIGH (ref 70–99)

## 2024-12-21 LAB — COMPREHENSIVE METABOLIC PANEL WITH GFR
ALT: <5 U/L (ref 0–44)
AST: 10 U/L — ABNORMAL LOW (ref 15–41)
Albumin: 2.9 g/dL — ABNORMAL LOW (ref 3.5–5.0)
Alkaline Phosphatase: 104 U/L (ref 38–126)
Anion gap: 13 (ref 5–15)
BUN: 16 mg/dL (ref 8–23)
CO2: 27 mmol/L (ref 22–32)
Calcium: 8.3 mg/dL — ABNORMAL LOW (ref 8.9–10.3)
Chloride: 91 mmol/L — ABNORMAL LOW (ref 98–111)
Creatinine, Ser: 3.58 mg/dL — ABNORMAL HIGH (ref 0.61–1.24)
GFR, Estimated: 18 mL/min — ABNORMAL LOW
Glucose, Bld: 161 mg/dL — ABNORMAL HIGH (ref 70–99)
Potassium: 3.4 mmol/L — ABNORMAL LOW (ref 3.5–5.1)
Sodium: 131 mmol/L — ABNORMAL LOW (ref 135–145)
Total Bilirubin: 0.6 mg/dL (ref 0.0–1.2)
Total Protein: 7.8 g/dL (ref 6.5–8.1)

## 2024-12-21 LAB — FOLATE: Folate: 3.3 ng/mL — ABNORMAL LOW

## 2024-12-21 LAB — MAGNESIUM: Magnesium: 2 mg/dL (ref 1.7–2.4)

## 2024-12-21 LAB — PHOSPHORUS: Phosphorus: 4.2 mg/dL (ref 2.5–4.6)

## 2024-12-21 LAB — VITAMIN B12: Vitamin B-12: 1198 pg/mL — ABNORMAL HIGH (ref 180–914)

## 2024-12-21 LAB — STREP PNEUMONIAE URINARY ANTIGEN: Strep Pneumo Urinary Antigen: NEGATIVE

## 2024-12-21 LAB — SEDIMENTATION RATE: Sed Rate: 75 mm/h — ABNORMAL HIGH (ref 0–16)

## 2024-12-21 MED ORDER — ONDANSETRON HCL 4 MG/2ML IJ SOLN
4.0000 mg | Freq: Four times a day (QID) | INTRAMUSCULAR | Status: AC | PRN
  Administered 2024-12-21 (×2): 4 mg via INTRAVENOUS
  Filled 2024-12-21 (×2): qty 2

## 2024-12-21 MED ORDER — INSULIN GLARGINE-YFGN 100 UNIT/ML ~~LOC~~ SOLN
15.0000 [IU] | Freq: Every day | SUBCUTANEOUS | Status: AC
  Administered 2024-12-21 – 2024-12-25 (×5): 15 [IU] via SUBCUTANEOUS
  Filled 2024-12-21 (×6): qty 0.15

## 2024-12-21 MED ORDER — ACETAMINOPHEN 650 MG RE SUPP: 650.0000 mg | Freq: Four times a day (QID) | RECTAL | Status: AC | PRN

## 2024-12-21 MED ORDER — ONDANSETRON HCL 4 MG PO TABS: 4.0000 mg | ORAL_TABLET | Freq: Four times a day (QID) | ORAL | Status: AC | PRN

## 2024-12-21 MED ORDER — CHLORHEXIDINE GLUCONATE CLOTH 2 % EX PADS
6.0000 | MEDICATED_PAD | Freq: Every day | CUTANEOUS | Status: DC
  Administered 2024-12-22 – 2024-12-24 (×2): 6 via TOPICAL

## 2024-12-21 MED ORDER — LEVOTHYROXINE SODIUM 25 MCG PO TABS
125.0000 ug | ORAL_TABLET | Freq: Every morning | ORAL | Status: AC
  Administered 2024-12-21 – 2024-12-25 (×5): 125 ug via ORAL
  Filled 2024-12-21 (×5): qty 1

## 2024-12-21 MED ORDER — ACETAMINOPHEN 325 MG PO TABS: 650.0000 mg | ORAL_TABLET | Freq: Four times a day (QID) | ORAL | Status: AC | PRN

## 2024-12-21 MED ORDER — OXYCODONE HCL 5 MG PO TABS
5.0000 mg | ORAL_TABLET | ORAL | Status: AC | PRN
  Administered 2024-12-21 – 2024-12-25 (×12): 5 mg via ORAL
  Filled 2024-12-21 (×11): qty 1

## 2024-12-21 MED ORDER — TAMSULOSIN HCL 0.4 MG PO CAPS
0.4000 mg | ORAL_CAPSULE | Freq: Every day | ORAL | Status: DC
  Administered 2024-12-21: 0.4 mg via ORAL
  Filled 2024-12-21: qty 1

## 2024-12-21 MED ORDER — SODIUM CHLORIDE 0.9% FLUSH
3.0000 mL | Freq: Two times a day (BID) | INTRAVENOUS | Status: AC
  Administered 2024-12-21 – 2024-12-25 (×10): 3 mL via INTRAVENOUS

## 2024-12-21 MED ORDER — POLYETHYLENE GLYCOL 3350 17 G PO PACK
17.0000 g | PACK | Freq: Two times a day (BID) | ORAL | Status: DC
  Administered 2024-12-21 (×2): 17 g via ORAL
  Filled 2024-12-21 (×2): qty 1

## 2024-12-21 MED ORDER — VANCOMYCIN HCL IN DEXTROSE 1-5 GM/200ML-% IV SOLN
1000.0000 mg | Freq: Once | INTRAVENOUS | Status: AC
  Administered 2024-12-21: 1000 mg via INTRAVENOUS
  Filled 2024-12-21: qty 200

## 2024-12-21 MED ORDER — SODIUM CHLORIDE 0.9% FLUSH: 3.0000 mL | INTRAVENOUS | Status: AC | PRN

## 2024-12-21 MED ORDER — SIMVASTATIN 20 MG PO TABS
20.0000 mg | ORAL_TABLET | Freq: Every evening | ORAL | Status: AC
  Administered 2024-12-21 – 2024-12-25 (×5): 20 mg via ORAL
  Filled 2024-12-21 (×5): qty 1

## 2024-12-21 MED ORDER — VANCOMYCIN HCL IN DEXTROSE 1-5 GM/200ML-% IV SOLN
1000.0000 mg | INTRAVENOUS | Status: AC
  Administered 2024-12-21 – 2024-12-25 (×4): 1000 mg via INTRAVENOUS
  Filled 2024-12-21 (×3): qty 200

## 2024-12-21 MED ORDER — SCOPOLAMINE 1 MG/3DAYS TD PT72: 1.0000 | MEDICATED_PATCH | TRANSDERMAL | Status: DC

## 2024-12-21 MED ORDER — SODIUM CHLORIDE 0.9 % IV SOLN: 250.0000 mL | INTRAVENOUS | Status: AC | PRN

## 2024-12-21 NOTE — Progress Notes (Signed)
 [facility] Progress Note  Subjective:  Seen in room, no c/o's   Presentation summary: pending   Vitals:   12/21/24 0900 12/21/24 1045 12/21/24 1420 12/21/24 1549  BP: (!) 150/82 (!) 159/79 (!) 159/86 (!) 172/75  Pulse: (!) 50 (!) 58 (!) 57 (!) 55  Resp: 13 13 11 20   Temp:    (!) 97.4 F (36.3 C)  TempSrc:    Oral  SpO2: 100% 100% 98% 100%  Weight:      Height:        Exam: General: Lying flat in bed, on nasal oxygen, nad  Head: NCAT sclera not icteric MMM CV: Regular rate, no murmur, no rub  Pulm: normal respiratory effort, lungs clear  Abdomen: non-tender, no masses  Lower extremities: trace edema  Neuro: A & O X 3. Moves all extremities spontaneously. Psych:  Responds to questions appropriately with a normal affect. Dialysis Access: L chest TDC     OP HD: [facility] MWF 4h  122.9kg  B400  2K bath  TDC  Heparin  2500  -Mircera 150 q 2 wks (last 1/21) -Venofer  100 mg IV x 5  -Calcitriol  0.25 q HD  CXR - sig bilat pulm edema, R infiltrates also may be edema   Assessment/ Plan: Back pain: H/o spinal stenosis. CT notable for lytic lesions/fractures to T12 vertebral body. Plan for MRI. Per primary team.  Dyspnea: pulmonary edema on initial CXR. UF 3 L w/ HD 2/01.   ESRD.  HD MWF. No missed HD but treatments shortened. Had HD here on Sunday 2/01. Next HD tues or Wednesday.   Hypertension. Resume home meds. Lower volume with HD HFpEF.  Volume removal with HD. Continue to challenge dry weight.  Anemia. Hgb 9.0. On ESA/IV Fe as outpatient  Metabolic bone disease.  Continue VDRA. Follow Ca/Phos trend.  T2DM         Myer Fret MD  [facility] 12/21/2024, 5:05 PM  Recent Labs  Lab 12/20/24 1411 12/20/24 1512 12/21/24 0300  HGB 9.5*  --  8.5*  ALBUMIN   --  2.8* 2.9*  CALCIUM  --  8.6* 8.3*  PHOS  --   --  4.2  CREATININE 5.60* 5.12* 3.58*  K 4.4 3.7 3.4*   Recent Labs  Lab 12/20/24 2042  IRON  32*  TIBC 165*  FERRITIN 798*   Inpatient medications:   calcitRIOL   0.25 mcg Oral Q M,W,F-HD   Chlorhexidine  Gluconate Cloth  6 each Topical Q0600   [START ON 12/23/2024] darbepoetin (ARANESP ) injection - DIALYSIS  150 mcg Subcutaneous Q Wed-1800   insulin  aspart  0-6 Units Subcutaneous Q4H   insulin  glargine-yfgn  15 Units Subcutaneous QHS   levothyroxine   125 mcg Oral q AM   polyethylene glycol  17 g Oral BID   [START ON 12/23/2024] scopolamine   1 patch Transdermal Q72H   simvastatin   20 mg Oral QPM   sodium chloride  flush  3 mL Intravenous Q12H   tamsulosin   0.4 mg Oral QPC supper    sodium chloride      ceFEPime  (MAXIPIME ) IV     vancomycin      sodium chloride , acetaminophen  **OR** acetaminophen , HYDROmorphone  (DILAUDID ) injection, lidocaine  (PF), ondansetron  **OR** ondansetron  (ZOFRAN ) IV, oxyCODONE , sodium chloride  flush, traMADol 

## 2024-12-21 NOTE — Evaluation (Signed)
 Physical Therapy Evaluation Patient Details Name: Brendan [name]. MRN: [id number] DOB: 1956/07/02 Today's Date: 12/21/2024  History of Present Illness  Pt is 69 year old presented to [facility] on  12/20/24 for back pain.  Pt with compression fracture of T12 with likely discitis and osteomyelitis at T11-T12 there is also an inflammatory lesion at the level of T8. Neurosurgery consulted and recommended IR consult for possible needle aspiration to T11-12 for culture. No surgical intervention recommended. Pt with recent hospitalization and inpatient rehab stay (11/03/24-12/05/24) after sepsis. Early Dec was also admitted with fall and trace SAH. PMH - ESRD on HD, necrotizing fasciitis, DM2, HTN, hypothyroidism, OSA, obesity, CKD, CHF, glaucoma, gynecomastia, HLD, vertigo, obesity, peripheral neuropathy, prostate CA, sickle cell trait  Clinical Impression  Pt admitted with above diagnosis and presents to PT with functional limitations due to deficits listed below (See PT problem list). Pt needs skilled PT to maximize independence and safety. Pt from home with wife after extended acute and inpatient rehab stay. Now back pain and dizziness limiting mobility. Hopefully pt can return home with wife if pain and dizziness improve to the level that allows him to mobilize at supervision level.           If plan is discharge home, recommend the following: A little help with walking and/or transfers;A little help with bathing/dressing/bathroom;Assist for transportation;Help with stairs or ramp for entrance;Assistance with cooking/housework   Can travel by private vehicle        Equipment Recommendations None recommended by PT  Recommendations for Other Services       Functional Status Assessment Patient has had a recent decline in their functional status and demonstrates the ability to make significant improvements in function in a reasonable and predictable amount of time.     Precautions / Restrictions  Precautions Precautions: Fall Recall of Precautions/Restrictions: Intact Restrictions Weight Bearing Restrictions Per Provider Order: No      Mobility  Bed Mobility Overal bed mobility: Needs Assistance Bed Mobility: Supine to Sit, Sit to Supine     Supine to sit: Min assist, HOB elevated, Used rails Sit to supine: Min assist   General bed mobility comments: Attempted to have pt roll and come up from sidelying to minimize back pain but pt on ED stretcher and no room to roll. Assist to elevate trunk into sitting and to bring legs back up returning to supine.    Transfers Overall transfer level: Needs assistance Equipment used: 1 person hand held assist Transfers: Sit to/from Stand Sit to Stand: Min assist           General transfer comment: Pt stood briefly to readjust in bed.    Ambulation/Gait                  Stairs            Wheelchair Mobility     Tilt Bed    Modified Rankin (Stroke Patients Only)       Balance Overall balance assessment: Needs assistance Sitting-balance support: No upper extremity supported, Feet supported Sitting balance-Leahy Scale: Fair     Standing balance support: Bilateral upper extremity supported Standing balance-Leahy Scale: Poor Standing balance comment: UE support                             Pertinent Vitals/Pain Pain Assessment Pain Assessment: 0-10 Pain Score: 8  Pain Location: low back Pain Descriptors / Indicators: Grimacing, Guarding, Aching Pain  Intervention(s): Limited activity within patient's tolerance, Monitored during session, Repositioned, Patient requesting pain meds-RN notified    Home Living Family/patient expects to be discharged to:: Private residence Living Arrangements: Spouse/significant other Available Help at Discharge: Family;Available PRN/intermittently Type of Home: House Home Access: Stairs to enter Entrance Stairs-Rails: Right;Left;Can reach both Entrance  Stairs-Number of Steps: 4   Home Layout: One level Home Equipment: Cane - single point;Hand held Programmer, Systems (2 wheels);BSC/3in1;Rollator (4 wheels) Additional Comments: Pt lives with wife who works during the day. Wife takes him to HD before going to work and then picks him up during her lunch hour and takes him home.    Prior Function Prior Level of Function : Needs assist             Mobility Comments: At DC from inpatient rehab was amb with rolling walker and supervision.       Extremity/Trunk Assessment   Upper Extremity Assessment Upper Extremity Assessment: Defer to OT evaluation    Lower Extremity Assessment Lower Extremity Assessment: Generalized weakness       Communication   Communication Communication: No apparent difficulties    Cognition Arousal: Alert Behavior During Therapy: WFL for tasks assessed/performed   PT - Cognitive impairments: No apparent impairments                         Following commands: Intact       Cueing Cueing Techniques: Verbal cues, Tactile cues     General Comments General comments (skin integrity, edema, etc.): Pt reported dizziness with BP normal. Per further chart review appears pt has had some vestibular issues prior.    Exercises     Assessment/Plan    PT Assessment Patient needs continued PT services  PT Problem List Decreased strength;Decreased balance;Decreased mobility;Decreased activity tolerance;Pain       PT Treatment Interventions DME instruction;Gait training;Functional mobility training;Therapeutic activities;Therapeutic exercise;Balance training;Patient/family education    PT Goals (Current goals can be found in the Care Plan section)  Acute Rehab PT Goals Patient Stated Goal: return home PT Goal Formulation: With patient Time For Goal Achievement: 01/04/25 Potential to Achieve Goals: Good    Frequency Min 2X/week     Co-evaluation               AM-PAC PT 6  Clicks Mobility  Outcome Measure Help needed turning from your back to your side while in a flat bed without using bedrails?: A Little Help needed moving from lying on your back to sitting on the side of a flat bed without using bedrails?: A Little Help needed moving to and from a bed to a chair (including a wheelchair)?: A Little Help needed standing up from a chair using your arms (e.g., wheelchair or bedside chair)?: A Little Help needed to walk in hospital room?: Total Help needed climbing 3-5 steps with a railing? : Total 6 Click Score: 14    End of Session   Activity Tolerance: Other (comment) (limited by dizziness) Patient left: in bed;with call bell/phone within reach Nurse Communication: Mobility status PT Visit Diagnosis: Other abnormalities of gait and mobility (R26.89);Unsteadiness on feet (R26.81);Muscle weakness (generalized) (M62.81);Dizziness and giddiness (R42);Pain Pain - part of body:  (back)    Time: 8961-8944 PT Time Calculation (min) (ACUTE ONLY): 17 min   Charges:   PT Evaluation $PT Eval Moderate Complexity: 1 Mod   PT General Charges $$ ACUTE PT VISIT: 1 Visit  [facility] PT Acute Rehabilitation Services Office [phone]   Rodgers ORN [facility] 12/21/2024, 2:11 PM

## 2024-12-21 NOTE — Progress Notes (Signed)
" °  Echocardiogram 2D Echocardiogram has been performed.  Devora Ellouise SAUNDERS 12/21/2024, 10:45 AM "

## 2024-12-22 ENCOUNTER — Inpatient Hospital Stay (HOSPITAL_COMMUNITY)

## 2024-12-22 DIAGNOSIS — E440 Moderate protein-calorie malnutrition: Secondary | ICD-10-CM

## 2024-12-22 LAB — GLUCOSE, CAPILLARY
Glucose-Capillary: 213 mg/dL — ABNORMAL HIGH (ref 70–99)
Glucose-Capillary: 220 mg/dL — ABNORMAL HIGH (ref 70–99)
Glucose-Capillary: 226 mg/dL — ABNORMAL HIGH (ref 70–99)
Glucose-Capillary: 273 mg/dL — ABNORMAL HIGH (ref 70–99)
Glucose-Capillary: 316 mg/dL — ABNORMAL HIGH (ref 70–99)
Glucose-Capillary: 319 mg/dL — ABNORMAL HIGH (ref 70–99)

## 2024-12-22 LAB — PROTIME-INR
INR: 1.1 (ref 0.8–1.2)
Prothrombin Time: 14.5 s (ref 11.4–15.2)

## 2024-12-22 MED ORDER — NEPRO/CARBSTEADY PO LIQD
237.0000 mL | Freq: Two times a day (BID) | ORAL | Status: AC
  Administered 2024-12-22 – 2024-12-25 (×5): 237 mL via ORAL

## 2024-12-22 MED ORDER — MIDAZOLAM HCL 2 MG/2ML IJ SOLN
INTRAMUSCULAR | Status: AC
  Filled 2024-12-22: qty 2

## 2024-12-22 MED ORDER — SMOG ENEMA
960.0000 mL | Freq: Once | RECTAL | Status: AC
  Administered 2024-12-22: 960 mL via RECTAL
  Filled 2024-12-22: qty 960

## 2024-12-22 MED ORDER — MIDAZOLAM HCL (PF) 2 MG/2ML IJ SOLN
INTRAMUSCULAR | Status: AC | PRN
  Administered 2024-12-22: 1 mg via INTRAVENOUS

## 2024-12-22 MED ORDER — LIDOCAINE-EPINEPHRINE 1 %-1:100000 IJ SOLN
INTRAMUSCULAR | Status: AC
  Filled 2024-12-22: qty 1

## 2024-12-22 MED ORDER — FENTANYL CITRATE (PF) 100 MCG/2ML IJ SOLN
INTRAMUSCULAR | Status: AC | PRN
  Administered 2024-12-22: 50 ug via INTRAVENOUS
  Administered 2024-12-22: 25 ug via INTRAVENOUS

## 2024-12-22 MED ORDER — CARVEDILOL 6.25 MG PO TABS
6.2500 mg | ORAL_TABLET | Freq: Two times a day (BID) | ORAL | Status: AC
  Administered 2024-12-22 – 2024-12-25 (×7): 6.25 mg via ORAL
  Filled 2024-12-22 (×7): qty 1

## 2024-12-22 MED ORDER — SCOPOLAMINE 1 MG/3DAYS TD PT72
1.0000 | MEDICATED_PATCH | TRANSDERMAL | Status: AC
  Administered 2024-12-22 – 2024-12-25 (×2): 1 mg via TRANSDERMAL
  Filled 2024-12-22 (×2): qty 1

## 2024-12-22 MED ORDER — AMLODIPINE BESYLATE 5 MG PO TABS
5.0000 mg | ORAL_TABLET | Freq: Every day | ORAL | Status: AC
  Administered 2024-12-22 – 2024-12-25 (×4): 5 mg via ORAL
  Filled 2024-12-22 (×4): qty 1

## 2024-12-22 MED ORDER — FENTANYL CITRATE (PF) 100 MCG/2ML IJ SOLN
INTRAMUSCULAR | Status: AC
  Filled 2024-12-22: qty 2

## 2024-12-22 MED ORDER — LIDOCAINE-EPINEPHRINE 1 %-1:100000 IJ SOLN
20.0000 mL | Freq: Once | INTRAMUSCULAR | Status: AC
  Administered 2024-12-22: 20 mL via INTRADERMAL

## 2024-12-22 MED ORDER — CHLORHEXIDINE GLUCONATE CLOTH 2 % EX PADS
6.0000 | MEDICATED_PAD | Freq: Every day | CUTANEOUS | Status: DC
  Administered 2024-12-24: 6 via TOPICAL

## 2024-12-22 MED ORDER — BISACODYL 10 MG RE SUPP
10.0000 mg | Freq: Once | RECTAL | Status: AC
  Administered 2024-12-22: 10 mg via RECTAL
  Filled 2024-12-22: qty 1

## 2024-12-22 MED ORDER — POLYETHYLENE GLYCOL 3350 17 G PO PACK
17.0000 g | PACK | Freq: Two times a day (BID) | ORAL | Status: AC
  Administered 2024-12-22 – 2024-12-23 (×3): 17 g via ORAL
  Filled 2024-12-22 (×4): qty 1

## 2024-12-22 MED ORDER — RENA-VITE PO TABS
1.0000 | ORAL_TABLET | Freq: Every day | ORAL | Status: AC
  Administered 2024-12-23 – 2024-12-25 (×3): 1 via ORAL
  Filled 2024-12-22 (×3): qty 1

## 2024-12-22 NOTE — Progress Notes (Signed)
 Initial Nutrition Assessment  DOCUMENTATION CODES:   Non-severe (moderate) malnutrition in context of acute illness/injury  INTERVENTION:  Add Nepro Shake po BID, each supplement provides 425 kcal and 19 grams protein  Add Magic cup TID with meals, each supplement provides 290 kcal and 9 grams of protein  Add renal MVI w/ minerals Needs new weight collected Monitor lab trends and suggest liberalizing diet if appetite does not improve   NUTRITION DIAGNOSIS:  Moderate Malnutrition related to acute illness as evidenced by energy intake < 75% for > 7 days, moderate muscle depletion.   GOAL:  Patient will meet greater than or equal to 90% of their needs   MONITOR:  PO intake, Supplement acceptance, Weight trends, Skin  REASON FOR ASSESSMENT:  Consult Assessment of nutrition requirement/status  ASSESSMENT:   Pt with PMH significant for: ESRD-HD, T2DM, TBI, HfpEF, gastric band surgery (2009), Fournier's gangrene, MSSA bacteremia, vitamin D deficiency, prostate cancer, OSA, and HLD. Presented with acute on chronic back pain which has been worsening over last severeal days. MRI revealed vertebral osteomyelitis. Also admitted with acute on chronic diastolic heart failure.  Previous Admissions: 10/10 -09/03/2024: Fournier's gangrene of perineum/scrotum s/p I&D 10/28 -09/22/2024: acute on chronic heart failure 12/04: ED visit for forearm abscess s/p I&D 12/16 - 11/06/2024: fall s. R frontal meningioma 12/24 -11/23/2024: DKA r/t MSSA and Proteus bacteremia (line holiday) 1/05 - 12/05/2024: CIR admit Current Admission: 2/02: admitted, HD tx 2/03: IR: T11-T12 disc aspiration  Noted patient with multiple recent hospitalizations since October of last year. Noted above. He also endorses starting HD around that time.   Met with patient at bedside who is resting in bed, but visibly in pain. Able to obtain brief nutrition history. Has recently only been eating one meal per day reports  dysgeusia 2/2 ABX use and pain as a barrier to intake. This has been going on for approximately one week prior to admission. Prior to that, was consuming two meals and one snack per day. States his family (mother-in-law, son and daughter) has come in to town to help him with cooking and grocery shopping.  Admit/ Current Weight: 115.7 kg - weight appears pulled forward; needs new weight collected Last HD tx: 2/02 UF achieved: 3L EDW: 122.9 kg  Below outpatient target weight. Needs new weight to assess trend. No significant edema appreciated on exam.  Constipation noted. Smog enema ordered and patient reports dulcolax suppository this morning. Miralax  BID in place. Noted constipation can impact observed lab trends. He reports one BM every 2 days at baseline.  Reports no issues tolerating dialysis txs recently with BPs stable and average UF or 2L. Does not appears to be on phosphorus binders at baseline, but is on vitamin D analog.   Meets criteria for moderate malnutrition in the context of acute illness AEB recently reported poor PO intake and moderate muscle depletions on exam. Reports he was ambulatory with use of a walker up until the days leading up to admission.   OP HD: [facility] MWF 4h  B400  2K bath  TDC  Heparin  2500  -Mircera 150 q 2 wks (last 1/21) -Venofer  100 mg IV x 5  -Calcitriol  0.25 q HD   Intake/Output Summary (Last 24 hours) at 12/22/2024 1358 Last data filed at 12/21/2024 1735 Gross per 24 hour  Intake --  Output 50 ml  Net -50 ml   Net IO Since Admission: -3,050 mL [12/22/24 1358]  Drains/Lines: L internal jugular: hemodialysis catheter, double lumen (tunneled) UOP: 50ml x24  hours   Nutritionally Relevant Medications: Scheduled Meds:  calcitRIOL   0.25 mcg Oral Q M,W,F-HD   [START ON 12/23/2024] darbepoetin (ARANESP ) injection - DIALYSIS  150 mcg Subcutaneous Q Wed-1800   insulin  aspart  0-6 Units Subcutaneous Q4H   insulin  glargine-yfgn  15 Units Subcutaneous QHS    levothyroxine   125 mcg Oral q AM   polyethylene glycol  17 g Oral BID   SMOG  960 mL Rectal Once   Continuous Infusions:  ceFEPime  (MAXIPIME ) IV 1 g (12/21/24 2241)   vancomycin  1,000 mg (12/21/24 2141)   PRN Meds:HYDROmorphone  (DILAUDID ) injection, ondansetron  (ZOFRAN ) IV, oxyCODONE   Labs from 2/02 reviewed: Na+ 131 (L) K+ 3.4 (L) Crp 15.3 (H) CBG ranges from 119-270 mg/dL over the last 24 hours HgbA1c 8.3 (11/2024)     NUTRITION - FOCUSED PHYSICAL EXAM:  Flowsheet Row Most Recent Value  Orbital Region No depletion  Upper Arm Region No depletion  Thoracic and Lumbar Region No depletion  Buccal Region No depletion  Temple Region No depletion  Clavicle Bone Region No depletion  Clavicle and Acromion Bone Region Mild depletion  Scapular Bone Region No depletion  Dorsal Hand Mild depletion  Patellar Region No depletion  Anterior Thigh Region Moderate depletion  Posterior Calf Region Moderate depletion  Edema (RD Assessment) None  Hair Reviewed  Eyes Reviewed  Mouth Reviewed  Skin Reviewed  Nails Reviewed    Diet Order:   Diet Order             Diet NPO time specified Except for: Sips with Meds  Diet effective midnight                   EDUCATION NEEDS:   No education needs have been identified at this time  Skin:  Skin Assessment: Reviewed RN Assessment  Last BM:  PTA  Height:  Ht Readings from Last 1 Encounters:  12/20/24 6' 2 (1.88 m)   Weight:  Wt Readings from Last 1 Encounters:  12/20/24 115.7 kg    Ideal Body Weight:  86.4 kg  BMI:  Body mass index is 32.74 kg/m.  Estimated Nutritional Needs:   Kcal:  2100-2300 kcals  Protein:  100-115g  Fluid:  1L + UOP  Blair Deaner MS, RD, LDN Registered Dietitian I Clinical Nutrition RD Inpatient Contact Info in Amion

## 2024-12-22 NOTE — Progress Notes (Signed)
 Patient ID: Brendan Gayden., male   DOB: September 19, 1956, 69 y.o.   MRN: [id number] Disc space aspirated today demonstrating gross pus.  Gram stain does not show any discrete organisms.  Cultures are pending.  Continues conservative care for the present time.  Currently on cefepime  and vancomycin .

## 2024-12-22 NOTE — Progress Notes (Signed)
 Pt receives out-pt HD at [facility] on [facility] on MWF 12:25 pm chair time. Will assist as needed.   [name] Dialysis Navigator [phone]

## 2024-12-22 NOTE — Progress Notes (Signed)
 [facility] Progress Note  Subjective:  Seen in room, no c/o's   Presentation summary: pending   Vitals:   12/22/24 1305 12/22/24 1310 12/22/24 1322 12/22/24 1544  BP: (!) 175/90 (!) 189/94 (!) 179/93 (!) 158/69  Pulse: (!) 56 (!) 57 (!) 59 (!) 54  Resp: 11 14 12    Temp:    (!) 97.3 F (36.3 C)  TempSrc:    Oral  SpO2: 95% 96% 94% 97%  Weight:      Height:        Exam: General: Lying flat in bed, on nasal oxygen, nad  Head: NCAT sclera not icteric MMM CV: Regular rate, no murmur, no rub  Pulm: normal respiratory effort, lungs clear  Abdomen: non-tender, no masses  Lower extremities: trace edema  Neuro: A & O X 3. Moves all extremities spontaneously. Psych:  Responds to questions appropriately with a normal affect. Dialysis Access: L chest TDC     OP HD: [facility] MWF 4h  122.9kg  B400  2K bath  TDC  Heparin  2500  -Mircera 150 q 2 wks (last 1/21) -Venofer  100 mg IV x 5  -Calcitriol  0.25 q HD  CXR - sig bilat pulm edema, R infiltrates also may be edema   Assessment/ Plan: Back pain- H/o spinal stenosis. CT notable for lytic lesions/fractures to T12 vertebral body. Plan for IR procedure today.  Dyspnea-  pulmonary edema on initial CXR. UF 3 L w/ HD 2/01.  SOB is better. On RA.  ESRD-  HD MWF. No missed HD but treatments shortened. Had HD here on Sunday 2/01. Next HD Wed.  Hypertension- Resume home meds. Lower volume with HD HFpEF- Volume removal with HD. Continue to challenge dry weight.  Anemia- Hgb 9.0. On ESA/IV Fe as outpatient   Myer Fret MD  [facility] 12/22/2024, 4:20 PM  Recent Labs  Lab 12/20/24 1411 12/20/24 1512 12/21/24 0300  HGB 9.5*  --  8.5*  ALBUMIN   --  2.8* 2.9*  CALCIUM  --  8.6* 8.3*  PHOS  --   --  4.2  CREATININE 5.60* 5.12* 3.58*  K 4.4 3.7 3.4*   Recent Labs  Lab 12/20/24 2042  IRON  32*  TIBC 165*  FERRITIN 798*   Inpatient medications:  amLODipine   5 mg Oral Daily   calcitRIOL   0.25 mcg Oral Q M,W,F-HD   carvedilol    6.25 mg Oral BID WC   Chlorhexidine  Gluconate Cloth  6 each Topical Q0600   Chlorhexidine  Gluconate Cloth  6 each Topical Q0600   [START ON 12/23/2024] darbepoetin (ARANESP ) injection - DIALYSIS  150 mcg Subcutaneous Q Wed-1800   feeding supplement (NEPRO CARB STEADY)  237 mL Oral BID BM   insulin  aspart  0-6 Units Subcutaneous Q4H   insulin  glargine-yfgn  15 Units Subcutaneous QHS   levothyroxine   125 mcg Oral q AM   [START ON 12/23/2024] multivitamin  1 tablet Oral QHS   polyethylene glycol  17 g Oral BID   [START ON 12/23/2024] scopolamine   1 patch Transdermal Q72H   simvastatin   20 mg Oral QPM   sodium chloride  flush  3 mL Intravenous Q12H    ceFEPime  (MAXIPIME ) IV 1 g (12/21/24 2241)   vancomycin  1,000 mg (12/21/24 2141)   acetaminophen  **OR** acetaminophen , HYDROmorphone  (DILAUDID ) injection, lidocaine  (PF), ondansetron  **OR** ondansetron  (ZOFRAN ) IV, oxyCODONE , sodium chloride  flush, traMADol 

## 2024-12-22 NOTE — Procedures (Signed)
 Interventional Radiology Procedure Note  Procedure: T11-T12 disc aspiration  Findings: Please refer to procedural dictation for full description. 1-2 cc purulent aspirate.  Complications: None immediate  Estimated Blood Loss: < 5 mL  Recommendations: Follow culture results.   Ester Sides, MD

## 2024-12-22 NOTE — Evaluation (Signed)
 Occupational Therapy Evaluation Patient Details Name: Brendan [name]. MRN: [id number] DOB: 1956/10/21 Today's Date: 12/22/2024   History of Present Illness   Pt is 69 year old presented to [facility] on  12/20/24 for back pain.  Pt with compression fracture of T12 with likely discitis and osteomyelitis at T11-T12 there is also an inflammatory lesion at the level of T8. Neurosurgery consulted and recommended IR consult for possible needle aspiration to T11-12 for culture. No surgical intervention recommended. Pt with recent hospitalization and inpatient rehab stay (11/03/24-12/05/24) after sepsis. Early Dec was also admitted with fall and trace SAH. PMH - ESRD on HD, necrotizing fasciitis, DM2, HTN, hypothyroidism, OSA, obesity, CKD, CHF, glaucoma, gynecomastia, HLD, vertigo, obesity, peripheral neuropathy, prostate CA, sickle cell trait     Clinical Impressions Session limited by pain. Pt recently discharged from [facility] 1/17. Since that time he has been ambulating using a RW and wife was assisting as needed with LB ADL tasks. Pt scheduled for IR procedure for back today. Pt declined OOB mobility - discussed importance of mobility to maintain strength and improve gut motility in order to DC home with wife and resume outpt therapy. Acute OT will follow.      If plan is discharge home, recommend the following:   A lot of help with walking and/or transfers;A lot of help with bathing/dressing/bathroom;Assist for transportation;Help with stairs or ramp for entrance     Functional Status Assessment   Patient has had a recent decline in their functional status and demonstrates the ability to make significant improvements in function in a reasonable and predictable amount of time.     Equipment Recommendations   None recommended by OT     Recommendations for Other Services         Precautions/Restrictions   Precautions Precautions: Fall Recall of Precautions/Restrictions:  Intact Precaution/Restrictions Comments: L vestibulopathy with oscillopsia (per outpt PT); dizziness with movement Restrictions Weight Bearing Restrictions Per Provider Order: No     Mobility Bed Mobility               General bed mobility comments: attempted to roll to EOB however painful for pt and he declined further mobility    Transfers                   General transfer comment: declined      Balance Overall balance assessment: Needs assistance                                         ADL either performed or assessed with clinical judgement   ADL Overall ADL's : Needs assistance/impaired Eating/Feeding: Modified independent   Grooming: Set up;Bed level   Upper Body Bathing: Set up;Bed level   Lower Body Bathing: Moderate assistance;Bed level   Upper Body Dressing : Minimal assistance;Bed level   Lower Body Dressing: Maximal assistance;Bed level             Tub/Shower Transfer Details (indicate cue type and reason): pt only does sponge baths         Vision Baseline Vision/History: 0 No visual deficits Ability to See in Adequate Light: 0 Adequate Patient Visual Report: Blurring of vision Additional Comments: vision dysfunction from vestibular issues - oscillopsia (from previous admission)     Perception Perception: Within Functional Limits       Praxis Praxis: [facility]       Pertinent  Vitals/Pain Pain Assessment Pain Assessment: 0-10 Pain Score: 6  Pain Location: back Pain Descriptors / Indicators: Grimacing, Guarding, Aching, Sharp Pain Intervention(s): Limited activity within patient's tolerance     Extremity/Trunk Assessment Upper Extremity Assessment Upper Extremity Assessment: Right hand dominant;Generalized weakness   Lower Extremity Assessment Lower Extremity Assessment: Defer to PT evaluation   Cervical / Trunk Assessment Cervical / Trunk Assessment: Other exceptions (back pain)   Communication  Communication Communication: No apparent difficulties   Cognition Arousal: Alert Behavior During Therapy: WFL for tasks assessed/performed Cognition: No apparent impairments                               Following commands: Intact       Cueing  General Comments      Has not had a BM iin over 8 days. Educated pt on importance of mobility for gut health however pt delcined   Exercises Exercises:  (educated on completing Tband ex while in bed however pt declined)   Shoulder Instructions      Home Living Family/patient expects to be discharged to:: Private residence Living Arrangements: Spouse/significant other Available Help at Discharge: Family;Available PRN/intermittently Type of Home: House Home Access: Stairs to enter [employer] of Steps: 4 Entrance Stairs-Rails: Right;Left;Can reach both Home Layout: One level     Bathroom Shower/Tub: Producer, Television/film/video: Handicapped height Bathroom Accessibility: Yes How Accessible: Accessible via walker Home Equipment: Cane - single point;Hand held Programmer, Systems (2 wheels);BSC/3in1;Rollator (4 wheels)   Additional Comments: Pt lives with wife who works during the day. Wife takes him to HD before going to work and then picks him up during her lunch hour and takes him home.      Prior Functioning/Environment Prior Level of Function : Needs assist             Mobility Comments: At DC from inpatient rehab was amb with rolling walker and supervision. ADLs Comments: Reports previously mod I, however wife has been assisting with LB ADLs since 12/16 admission    OT Problem List: Decreased strength;Decreased activity tolerance;Impaired balance (sitting and/or standing);Obesity;Pain   OT Treatment/Interventions: Self-care/ADL training;Therapeutic exercise;DME and/or AE instruction;Therapeutic activities;Patient/family education;Balance training      OT Goals(Current goals can  be found in the care plan section)   Acute Rehab OT Goals Patient Stated Goal: get pain under control and go to outpt therapy OT Goal Formulation: With patient Time For Goal Achievement: 01/05/25 Potential to Achieve Goals: Good   OT Frequency:  Min 2X/week    Co-evaluation              AM-PAC OT 6 Clicks Daily Activity     Outcome Measure Help from another person eating meals?: None Help from another person taking care of personal grooming?: A Little Help from another person toileting, which includes using toliet, bedpan, or urinal?: A Lot Help from another person bathing (including washing, rinsing, drying)?: A Lot Help from another person to put on and taking off regular upper body clothing?: A Lot Help from another person to put on and taking off regular lower body clothing?: A Lot 6 Click Score: 15   End of Session Nurse Communication: Mobility status  Activity Tolerance: Patient limited by pain Patient left: in bed;with call bell/phone within reach;with bed alarm set  OT Visit Diagnosis: Unsteadiness on feet (R26.81);Other abnormalities of gait and mobility (R26.89);Muscle weakness (generalized) (M62.81);Pain Pain - part of  body:  (back)                Time: 8896-8884 OT Time Calculation (min): 12 min Charges:  OT General Charges $OT Visit: 1 Visit OT Evaluation $OT Eval Moderate Complexity: 1 Mod  Unnamed Zeien, OT/L   Acute OT Clinical Specialist Acute Rehabilitation Services Pager [phone] Office [phone]   [facility] 12/22/2024, 11:30 AM

## 2024-12-22 NOTE — Therapy (Incomplete)
 " OUTPATIENT OCCUPATIONAL THERAPY NEURO EVALUATION  Patient Name: Brendan [name]. MRN: [id number] DOB:Jan 22, 1956, 69 y.o., male Today's Date: 12/22/2024  PCP: *** REFERRING PROVIDER: Maurice Sharlet RAMAN, PA-C  END OF SESSION:   Past Medical History:  Diagnosis Date   Acanthosis nigricans    Atopic dermatitis    CHF (congestive heart failure) (HCC)    CKD (chronic kidney disease) stage 4, GFR 15-29 ml/min (HCC) 01/08/2023   Diabetes (HCC) 11/19/2002   Erectile dysfunction    Fatty liver 11/19/2005   Glaucoma 11/19/2006   Gynecomastia 11/20/2007   Hx of adenomatous colonic polyps 08/26/2023   Hyperaldosteronism 11/19/1998   Hypercholesterolemia    Hyperlipidemia 2010   Hypertension 1999   Hypoglycemic reaction    Hypothyroidism 11/19/2002   Incontinence    Intermittent vertigo 11/19/2010   Left cervical radiculopathy 11/19/2010   Lumbar radiculopathy    Obesity    Peripheral neuropathy    Pollen allergies 11/19/2005   perennial   Prostate cancer (HCC) 11/19/2004   Reflux esophagitis 11/19/1993   Sickle cell trait 11/19/2004   Sleep apnea, obstructive 11/19/1998   uses a cpap   Venous insufficiency 11/20/2003   Vitamin D deficiency 11/19/2010   Past Surgical History:  Procedure Laterality Date   COLONOSCOPY  2006   normal   INCISION AND DRAINAGE OF WOUND N/A 08/28/2024   Procedure: IRRIGATION AND EXCISIONAL DEBRIDEMENT WOUND;  Surgeon: Lyndel Deward PARAS, MD;  Location: [facility] OR;  Service: General;  Laterality: N/A;  EXCISIONAL DEBRIDEMENT   INCISION AND DRAINAGE PERIRECTAL ABSCESS N/A 08/30/2024   Procedure: INCISION AND DRAINAGE OF PERINEAL WOUND;  Surgeon: Polly Cordella LABOR, MD;  Location: [facility] OR;  Service: General;  Laterality: N/A;   IR REMOVAL TUN CV CATH W/O FL  11/13/2024   IR TUNNELED CENTRAL VENOUS CATH PLC W IMG  09/18/2024   IR TUNNELED CENTRAL VENOUS CATH PLC W IMG  09/22/2024   IR TUNNELED CENTRAL VENOUS CATH PLC W IMG  11/17/2024   lap band surgery  2009    left inguinal hernia repair  1998   LIPOMA EXCISION Left 04/17/2023   Procedure: MINOR EXCISION LEFT BUTTOCK SEBACEOUS CYST;  Surgeon: Lyndel Deward PARAS, MD;  Location: [facility];  Service: General;  Laterality: Left;   robotic prostatectomy  2008   TRANSESOPHAGEAL ECHOCARDIOGRAM (CATH LAB) N/A 11/18/2024   Procedure: TRANSESOPHAGEAL ECHOCARDIOGRAM;  Surgeon: Santo Stanly LABOR, MD;  Location: [facility] IN[facility];  Service: Cardiovascular;  Laterality: N/A;   Patient Active Problem List   Diagnosis Date Noted   Vertebral osteomyelitis (HCC) 12/20/2024   Elevated troponin 12/20/2024   CAP (community acquired pneumonia) 12/20/2024   Vestibulopathy 12/09/2024   Debility 11/23/2024   Bloodstream infection due to central venous catheter 11/19/2024   Fever, unknown origin 11/18/2024   MSSA bacteremia 11/18/2024   Bacteremia 11/18/2024   Psoriasis 11/18/2024   Hypotension 11/11/2024   History of traumatic brain injury 11/11/2024   Heart failure with preserved ejection fraction (HCC) 11/11/2024   Open wound 11/11/2024   TBI (traumatic brain injury) (HCC) 11/03/2024   Acute on chronic heart failure with preserved ejection fraction (HFpEF, >= 50%) (HCC) 09/15/2024   Hyperlipidemia associated with type 2 diabetes mellitus (HCC) 09/15/2024   Anemia of chronic disease 09/15/2024   Sepsis (HCC) 08/29/2024   Necrotizing fasciitis (HCC) 08/28/2024   Hx of adenomatous colonic polyps 08/26/2023   Research study patient 01/09/2023   Acute on chronic congestive heart failure (HCC) 01/09/2023   ESRD on dialysis (  HCC) 01/08/2023   Heart failure, systolic, acute (HCC) 03/26/2020   Acute on chronic diastolic CHF (congestive heart failure) (HCC) 03/25/2020   Hypokalemia 03/25/2020   Leukocytosis 03/25/2020   Renal insufficiency 03/25/2020   Chest pain 03/25/2020   Hypothyroidism 03/25/2020   Gout attack 03/25/2020   Morbid obesity (HCC) 10/28/2015   Internal hemorrhoids with  bleeding 07/31/2013   Insulin -requiring or dependent type II diabetes mellitus (HCC) 01/11/2010   OSA (obstructive sleep apnea) 01/11/2010   Hypertension associated with diabetes (HCC) 01/11/2010   GLAUCOMA 01/10/2010   Allergic rhinitis 01/10/2010   PROSTATE CANCER, HX OF 01/10/2010    ONSET DATE: 12/04/2024 referral date  REFERRING DIAG: S06.9X0D (ICD-10-CM) - Traumatic brain injury, without loss of consciousness, subsequent encounter  Admit date: 11/23/2024 Discharge date: 12/05/2024  THERAPY DIAG:  No diagnosis found.  Rationale for Evaluation and Treatment: Rehabilitation  SUBJECTIVE:   SUBJECTIVE STATEMENT: *** Pt accompanied by: {accompnied:27141}  PERTINENT HISTORY: ***  PRECAUTIONS: Other: glaucoma, cancer hx no electrical modalities, DM2  WEIGHT BEARING RESTRICTIONS: {Yes ***/No:24003}  PAIN:  Are you having pain? {OPRCPAIN:27236}  FALLS: Has patient fallen in last 6 months? {fallsyesno:27318}  LIVING ENVIRONMENT: Lives with: {OPRC lives with:25569::lives with their family} Lives in: {Lives in:25570} Stairs: {opstairs:27293} Has following equipment at home: {Assistive devices:23999}  PLOF: {PLOF:24004}  PATIENT GOALS: ***  OBJECTIVE:  Note: Objective measures were completed at Evaluation unless otherwise noted.  HAND DOMINANCE: {MISC; OT HAND [phone]}  ADLs: Overall ADLs: *** Transfers/ambulation related to ADLs: Eating: *** Grooming: *** UB Dressing: *** LB Dressing: *** Toileting: *** Bathing: *** Tub Shower transfers: *** Equipment: {equipment:25573}  IADLs: Shopping: *** Light housekeeping: *** Meal Prep: *** Community mobility: *** Medication management: *** Financial management: *** Handwriting: {OTWRITTENEXPRESSION:25361}  MOBILITY STATUS: {OTMOBILITY:25360}  POSTURE COMMENTS:  {posture:25561} Sitting balance: {sitting balance:25483}  ACTIVITY TOLERANCE: Activity tolerance: ***  FUNCTIONAL OUTCOME  MEASURES: {OTFUNCTIONALMEASURES:27238}  UPPER EXTREMITY ROM:    {AROM/PROM:27142} ROM Right eval Left eval  Shoulder flexion    Shoulder abduction    Shoulder adduction    Shoulder extension    Shoulder internal rotation    Shoulder external rotation    Elbow flexion    Elbow extension    Wrist flexion    Wrist extension    Wrist ulnar deviation    Wrist radial deviation    Wrist pronation    Wrist supination    (Blank rows = not tested)  UPPER EXTREMITY MMT:     MMT Right eval Left eval  Shoulder flexion    Shoulder abduction    Shoulder adduction    Shoulder extension    Shoulder internal rotation    Shoulder external rotation    Middle trapezius    Lower trapezius    Elbow flexion    Elbow extension    Wrist flexion    Wrist extension    Wrist ulnar deviation    Wrist radial deviation    Wrist pronation    Wrist supination    (Blank rows = not tested)  HAND FUNCTION: {handfunction:27230}  COORDINATION: {otcoordination:27237}  SENSATION: {sensation:27233}  EDEMA: ***  MUSCLE TONE: {UETONE:25567}  COGNITION: Overall cognitive status: {cognition:24006}  VISION: Subjective report: *** Baseline vision: {OTBASELINEVISION:25363} Visual history: {OTVISUALHISTORY:25364}  VISION ASSESSMENT: {visionassessment:27231}  Patient has difficulty with following activities due to following visual impairments: ***  PERCEPTION: {Perception:25564}  PRAXIS: {Praxis:25565}  OBSERVATIONS: ***  TREATMENT DATE: ***         PATIENT EDUCATION: Education details: *** Person educated: {Person educated:25204} Education method: {Education Method:25205} Education comprehension: {Education Comprehension:25206}  HOME EXERCISE PROGRAM: ***   GOALS: Goals reviewed with patient? {yes/no:20286}  SHORT TERM GOALS: Target date:  ***  *** Baseline: Goal status: INITIAL  2.  *** Baseline:  Goal status: INITIAL  3.  *** Baseline:  Goal status: INITIAL  4.  *** Baseline:  Goal status: INITIAL  5.  *** Baseline:  Goal status: INITIAL  6.  *** Baseline:  Goal status: INITIAL  LONG TERM GOALS: Target date: ***  *** Baseline:  Goal status: INITIAL  2.  *** Baseline:  Goal status: INITIAL  3.  *** Baseline:  Goal status: INITIAL  4.  *** Baseline:  Goal status: INITIAL  5.  *** Baseline:  Goal status: INITIAL  6.  *** Baseline:  Goal status: INITIAL  ASSESSMENT:  CLINICAL IMPRESSION: Patient is a 69 y.o. male who was seen today for occupational therapy evaluation for TBI. Hx includes ***. Patient currently presents *** baseline level of functioning demonstrating functional deficits and impairments as noted below. Pt would benefit from skilled OT services in the outpatient setting to work on impairments as noted below to help pt return to PLOF as able.  '  PERFORMANCE DEFICITS: in functional skills including {OT physical skills:25468}, cognitive skills including {OT cognitive skills:25469}, and psychosocial skills including {OT psychosocial skills:25470}.   IMPAIRMENTS: are limiting patient from {OT performance deficits:25471}.   CO-MORBIDITIES: {Comorbidities:25485} that affects occupational performance. Patient will benefit from skilled OT to address above impairments and improve overall function.  MODIFICATION OR ASSISTANCE TO COMPLETE EVALUATION: {OT modification:25474}  OT OCCUPATIONAL PROFILE AND HISTORY: {OT PROFILE AND HISTORY:25484}  CLINICAL DECISION MAKING: {OT CDM:25475}  REHAB POTENTIAL: {rehabpotential:25112}  EVALUATION COMPLEXITY: {Evaluation complexity:25115}    PLAN:  OT FREQUENCY: {rehab frequency:25116}  OT DURATION: {rehab duration:25117}  PLANNED INTERVENTIONS: {OT Interventions:25467}  RECOMMENDED OTHER SERVICES: ***  CONSULTED AND AGREED WITH  PLAN OF CARE: {ENR:74513}  PLAN FOR NEXT SESSION: PIERRETTE Rocky Dutch, OT 12/22/2024, 12:24 PM           "

## 2024-12-23 ENCOUNTER — Ambulatory Visit

## 2024-12-23 DIAGNOSIS — Z992 Dependence on renal dialysis: Secondary | ICD-10-CM | POA: Diagnosis not present

## 2024-12-23 DIAGNOSIS — I5033 Acute on chronic diastolic (congestive) heart failure: Secondary | ICD-10-CM | POA: Diagnosis not present

## 2024-12-23 DIAGNOSIS — Z794 Long term (current) use of insulin: Secondary | ICD-10-CM | POA: Diagnosis not present

## 2024-12-23 DIAGNOSIS — E119 Type 2 diabetes mellitus without complications: Secondary | ICD-10-CM | POA: Diagnosis not present

## 2024-12-23 DIAGNOSIS — E1159 Type 2 diabetes mellitus with other circulatory complications: Secondary | ICD-10-CM | POA: Diagnosis not present

## 2024-12-23 DIAGNOSIS — N186 End stage renal disease: Secondary | ICD-10-CM | POA: Diagnosis not present

## 2024-12-23 DIAGNOSIS — E039 Hypothyroidism, unspecified: Secondary | ICD-10-CM | POA: Diagnosis not present

## 2024-12-23 DIAGNOSIS — I152 Hypertension secondary to endocrine disorders: Secondary | ICD-10-CM | POA: Diagnosis not present

## 2024-12-23 DIAGNOSIS — M4620 Osteomyelitis of vertebra, site unspecified: Secondary | ICD-10-CM | POA: Diagnosis not present

## 2024-12-23 DIAGNOSIS — Z87820 Personal history of traumatic brain injury: Secondary | ICD-10-CM | POA: Diagnosis not present

## 2024-12-23 LAB — GLUCOSE, CAPILLARY
Glucose-Capillary: 159 mg/dL — ABNORMAL HIGH (ref 70–99)
Glucose-Capillary: 169 mg/dL — ABNORMAL HIGH (ref 70–99)
Glucose-Capillary: 283 mg/dL — ABNORMAL HIGH (ref 70–99)
Glucose-Capillary: 291 mg/dL — ABNORMAL HIGH (ref 70–99)
Glucose-Capillary: 291 mg/dL — ABNORMAL HIGH (ref 70–99)
Glucose-Capillary: 302 mg/dL — ABNORMAL HIGH (ref 70–99)

## 2024-12-23 LAB — CBC WITH DIFFERENTIAL/PLATELET
Abs Immature Granulocytes: 0.09 10*3/uL — ABNORMAL HIGH (ref 0.00–0.07)
Basophils Absolute: 0.1 10*3/uL (ref 0.0–0.1)
Basophils Relative: 1 %
Eosinophils Absolute: 0.7 10*3/uL — ABNORMAL HIGH (ref 0.0–0.5)
Eosinophils Relative: 6 %
HCT: 25 % — ABNORMAL LOW (ref 39.0–52.0)
Hemoglobin: 8.1 g/dL — ABNORMAL LOW (ref 13.0–17.0)
Immature Granulocytes: 1 %
Lymphocytes Relative: 7 %
Lymphs Abs: 0.9 10*3/uL (ref 0.7–4.0)
MCH: 27.1 pg (ref 26.0–34.0)
MCHC: 32.4 g/dL (ref 30.0–36.0)
MCV: 83.6 fL (ref 80.0–100.0)
Monocytes Absolute: 1 10*3/uL (ref 0.1–1.0)
Monocytes Relative: 8 %
Neutro Abs: 10 10*3/uL — ABNORMAL HIGH (ref 1.7–7.7)
Neutrophils Relative %: 77 %
Platelets: 413 10*3/uL — ABNORMAL HIGH (ref 150–400)
RBC: 2.99 MIL/uL — ABNORMAL LOW (ref 4.22–5.81)
RDW: 17.3 % — ABNORMAL HIGH (ref 11.5–15.5)
WBC: 12.8 10*3/uL — ABNORMAL HIGH (ref 4.0–10.5)
nRBC: 0 % (ref 0.0–0.2)

## 2024-12-23 LAB — RENAL FUNCTION PANEL
Albumin: 2.7 g/dL — ABNORMAL LOW (ref 3.5–5.0)
Anion gap: 10 (ref 5–15)
BUN: 28 mg/dL — ABNORMAL HIGH (ref 8–23)
CO2: 28 mmol/L (ref 22–32)
Calcium: 8.6 mg/dL — ABNORMAL LOW (ref 8.9–10.3)
Chloride: 93 mmol/L — ABNORMAL LOW (ref 98–111)
Creatinine, Ser: 5.37 mg/dL — ABNORMAL HIGH (ref 0.61–1.24)
GFR, Estimated: 11 mL/min — ABNORMAL LOW
Glucose, Bld: 301 mg/dL — ABNORMAL HIGH (ref 70–99)
Phosphorus: 6.2 mg/dL — ABNORMAL HIGH (ref 2.5–4.6)
Potassium: 4.3 mmol/L (ref 3.5–5.1)
Sodium: 131 mmol/L — ABNORMAL LOW (ref 135–145)

## 2024-12-23 LAB — T3: T3, Total: 48 ng/dL — ABNORMAL LOW (ref 71–180)

## 2024-12-23 LAB — HEPATITIS B SURFACE ANTIBODY, QUANTITATIVE: Hep B S AB Quant (Post): 2987 m[IU]/mL

## 2024-12-23 LAB — LEGIONELLA PNEUMOPHILA SEROGP 1 UR AG: L. pneumophila Serogp 1 Ur Ag: NEGATIVE

## 2024-12-23 MED ORDER — HEPARIN SODIUM (PORCINE) 1000 UNIT/ML IJ SOLN
INTRAMUSCULAR | Status: AC
  Filled 2024-12-23: qty 5

## 2024-12-23 MED ORDER — ANTICOAGULANT SODIUM CITRATE 4% (200MG/5ML) IV SOLN: 5.0000 mL | Status: DC | PRN

## 2024-12-23 MED ORDER — HEPARIN SODIUM (PORCINE) 1000 UNIT/ML DIALYSIS: 1000.0000 [IU] | INTRAMUSCULAR | Status: DC | PRN

## 2024-12-23 MED ORDER — LIDOCAINE-PRILOCAINE 2.5-2.5 % EX CREA: 1.0000 | TOPICAL_CREAM | CUTANEOUS | Status: DC | PRN

## 2024-12-23 MED ORDER — INSULIN ASPART 100 UNIT/ML IJ SOLN
2.0000 [IU] | Freq: Three times a day (TID) | INTRAMUSCULAR | Status: DC
  Administered 2024-12-23 – 2024-12-25 (×6): 2 [IU] via SUBCUTANEOUS
  Filled 2024-12-23 (×5): qty 2

## 2024-12-23 MED ORDER — ALTEPLASE 2 MG IJ SOLR: 2.0000 mg | Freq: Once | INTRAMUSCULAR | Status: DC | PRN

## 2024-12-23 MED ORDER — HEPARIN SODIUM (PORCINE) 1000 UNIT/ML DIALYSIS: 2500.0000 [IU] | Freq: Once | INTRAMUSCULAR | Status: DC

## 2024-12-23 MED ORDER — PENTAFLUOROPROP-TETRAFLUOROETH EX AERO: 1.0000 | INHALATION_SPRAY | CUTANEOUS | Status: DC | PRN

## 2024-12-23 MED ORDER — OXYCODONE HCL 5 MG PO TABS
ORAL_TABLET | ORAL | Status: AC
  Filled 2024-12-23: qty 1

## 2024-12-23 MED ORDER — HEPARIN SODIUM (PORCINE) 1000 UNIT/ML IJ SOLN
INTRAMUSCULAR | Status: AC
  Filled 2024-12-23: qty 4

## 2024-12-23 MED ORDER — HEPARIN SODIUM (PORCINE) 1000 UNIT/ML DIALYSIS
20.0000 [IU]/kg | INTRAMUSCULAR | Status: DC | PRN
  Administered 2024-12-23: 2300 [IU] via INTRAVENOUS_CENTRAL

## 2024-12-23 MED ORDER — LIDOCAINE HCL (PF) 1 % IJ SOLN: 5.0000 mL | INTRAMUSCULAR | Status: DC | PRN

## 2024-12-23 MED ORDER — VANCOMYCIN HCL IN DEXTROSE 1-5 GM/200ML-% IV SOLN
INTRAVENOUS | Status: AC
  Filled 2024-12-23: qty 200

## 2024-12-23 MED ORDER — HEPARIN SODIUM (PORCINE) 1000 UNIT/ML DIALYSIS
1000.0000 [IU] | INTRAMUSCULAR | Status: DC | PRN
  Administered 2024-12-23: 1000 [IU]

## 2024-12-23 MED ORDER — CALCITRIOL 0.25 MCG PO CAPS
ORAL_CAPSULE | ORAL | Status: AC
  Filled 2024-12-23: qty 1

## 2024-12-23 NOTE — Assessment & Plan Note (Signed)
 No signs of volume overload, continue ultrafiltration with HD

## 2024-12-23 NOTE — Progress Notes (Signed)
 Heart Failure Navigator Progress Note  Assessed for Heart & Vascular TOC clinic readiness.  Patient does not meet criteria due to he is an Advanced Heart Failure Team patient of Dr. Rolan. .   Navigator will sign off at this time.   Stephane Haddock, BSN, Scientist, clinical (histocompatibility and immunogenetics) Only

## 2024-12-23 NOTE — Progress Notes (Signed)
 [facility] Progress Note  Subjective:  Seen in HD, no c/o's   Presentation summary: pending   Vitals:   12/23/24 0805 12/23/24 0918 12/23/24 0945 12/23/24 1157  BP: (!) 144/75 138/78 (!) 140/78 (!) 152/65  Pulse: (!) 56 (!) 54 (!) 57 (!) 57  Resp: 16 16 16 16   Temp:   97.9 F (36.6 C) 97.6 F (36.4 C)  TempSrc:    Oral  SpO2: 97% 96% 98% 93%  Weight:   (S) 120 kg   Height:        Exam: General: Lying flat in bed, on nasal oxygen, nad  Head: NCAT sclera not icteric MMM CV: Regular rate, no murmur, no rub  Pulm: normal respiratory effort, lungs clear  Abdomen: non-tender, no masses  Lower extremities: trace edema  Neuro: A & O X 3. Moves all extremities spontaneously. Psych:  Responds to questions appropriately with a normal affect. Dialysis Access: L chest TDC     OP HD: [facility] MWF 4h  122.9kg  B400  2K bath  TDC  Heparin  2500  -Mircera 150 q 2 wks (last 1/21) -Venofer  100 mg IV x 5  -Calcitriol  0.25 q HD  CXR - sig bilat pulm edema, R infiltrates also may be edema   Assessment/ Plan: Back pain- H/o spinal stenosis. CT notable for lytic lesions/fractures to T12 vertebral body. S/P IR procedure 2/03. Per pmd.  Dyspnea-  pulmonary edema on initial CXR. UF 3 L w/ 1st HD here and 2.5 L w/ HD overnight last night.  SOB is better. On RA. Under dry wt. No hypotension.  ESRD-  HD MWF. No missed HD. Had HD here on Sunday 2/01 and had HD early this am Wednesday. Next HD Friday.  Hypertension- getting his home meds. BP's stable today HFpEF- Volume removal with HD. Continue to challenge dry weight as tolerated.  Anemia- Hgb 9.0. On ESA/IV Fe as outpatient   Myer Fret MD  [facility] 12/23/2024, 1:38 PM  Recent Labs  Lab 12/21/24 0300 12/23/24 0113  HGB 8.5* 8.1*  ALBUMIN  2.9* 2.7*  CALCIUM 8.3* 8.6*  PHOS 4.2 6.2*  CREATININE 3.58* 5.37*  K 3.4* 4.3   Recent Labs  Lab 12/20/24 2042  IRON  32*  TIBC 165*  FERRITIN 798*   Inpatient medications:   amLODipine   5 mg Oral Daily   calcitRIOL   0.25 mcg Oral Q M,W,F-HD   carvedilol   6.25 mg Oral BID WC   Chlorhexidine  Gluconate Cloth  6 each Topical Q0600   Chlorhexidine  Gluconate Cloth  6 each Topical Q0600   Chlorhexidine  Gluconate Cloth  6 each Topical Q0600   darbepoetin (ARANESP ) injection - DIALYSIS  150 mcg Subcutaneous Q Wed-1800   feeding supplement (NEPRO CARB STEADY)  237 mL Oral BID BM   insulin  aspart  0-6 Units Subcutaneous Q4H   insulin  aspart  2 Units Subcutaneous TID WC   insulin  glargine-yfgn  15 Units Subcutaneous QHS   levothyroxine   125 mcg Oral q AM   multivitamin  1 tablet Oral QHS   polyethylene glycol  17 g Oral BID   scopolamine   1 patch Transdermal Q72H   simvastatin   20 mg Oral QPM   sodium chloride  flush  3 mL Intravenous Q12H    ceFEPime  (MAXIPIME ) IV 1 g (12/22/24 2102)   vancomycin  1,000 mg (12/23/24 0729)   acetaminophen  **OR** acetaminophen , HYDROmorphone  (DILAUDID ) injection, ondansetron  **OR** ondansetron  (ZOFRAN ) IV, oxyCODONE , sodium chloride  flush, traMADol 

## 2024-12-23 NOTE — Progress Notes (Signed)
 Physical Therapy Treatment Patient Details Name: Brendan [name]. MRN: [id number] DOB: 1956-10-25 Today's Date: 12/23/2024   History of Present Illness Pt is 69 year old presented to [facility] on  12/20/24 for back pain.  Pt with compression fracture of T12 with likely discitis and osteomyelitis at T11-T12 there is also an inflammatory lesion at the level of T8. Neurosurgery consulted and recommended IR consult for possible needle aspiration to T11-12 for culture. No surgical intervention recommended. Pt with recent hospitalization and inpatient rehab stay (11/03/24-12/05/24) after sepsis. Early Dec was also admitted with fall and trace SAH. PMH - ESRD on HD, necrotizing fasciitis, DM2, HTN, hypothyroidism, OSA, obesity, CKD, CHF, glaucoma, gynecomastia, HLD, vertigo, obesity, peripheral neuropathy, prostate CA, sickle cell trait    PT Comments  Focused session initially on addressing his dizziness as it had limited his prior PT session. His symptoms and upbeating L rotary nystagmus were noted with L Trenda Craze testing, suggesting L posterior canal BPPV. Thus, performed x1 L Epley maneuver to try to treat and improve his dizziness. He remained limited in mobility this date primarily by severe back pain though, resulting in him being unable to stand. He did practice clearing his buttocks minimally from the EOB to progress towards a full transfer to stand in the future. He needed minA to clear his buttocks before sitting back down and modA to shift his hips to scoot laterally along EOB. He remains at risk for falls and is currently needing extensive physical assistance due to his back pain. If his wife can provide the level of care he needs then home with resuming OPPT can be an option. If not, then may need to consider other inpatient rehab options. Will continue to follow acutely.    If plan is discharge home, recommend the following: A little help with walking and/or transfers;A little help with  bathing/dressing/bathroom;Assist for transportation;Help with stairs or ramp for entrance;Assistance with cooking/housework   Can travel by Doctor, Hospital (measurements PT);Wheelchair cushion (measurements PT);Hospital bed (pending progress)    Recommendations for Other Services       Precautions / Restrictions Precautions Precautions: Fall;Back Precaution Booklet Issued: No Recall of Precautions/Restrictions: Intact Precaution/Restrictions Comments: L posterior canal BPPV; back precautions for comfort Restrictions Weight Bearing Restrictions Per Provider Order: No     Mobility  Bed Mobility Overal bed mobility: Needs Assistance Bed Mobility: Rolling, Sidelying to Sit, Sit to Supine Rolling: Min assist Sidelying to sit: Mod assist   Sit to supine: Min assist   General bed mobility comments: Cued pt to flex L leg and reach L UE to R EOB to roll to R for Epley maneuver. MinA needed at hips to rotate and roll. Cued pt to bring legs off R EOB and pull up on PT and push up on bed to ascend trunk to transition sidelying to sit. ModA needed at trunk along with several rests half or 3/4 ways propped up to sitting due to back pain. MinA to lift legs back onto bed with pt returning to supine, cuing pt to lean to his R elbow to control his descent    Transfers Overall transfer level: Needs assistance Equipment used: None Transfers: Bed to chair/wheelchair/BSC            Lateral/Scoot Transfers: Mod assist General transfer comment: Attempted to encourage pt to stand from EOB to RW, but pt reporting 10/10 too severe of back pain to attempt a full stand.  Pt frequently writhing in pain while sitting EOB. Thus, cued pt to just practice lifting his buttocks enough to barely clear it from EOB to initiate transfer training, x3 reps with success and minA to complete. Cued pt to scoot his hips to his R once his buttocks was cleared from the EOB on  the final rep in order to scoot towards HOB, modA needed at hips    Ambulation/Gait               General Gait Details: unable due to severe back pain at this time   Stairs             Wheelchair Mobility     Tilt Bed    Modified Rankin (Stroke Patients Only)       Balance Overall balance assessment: Needs assistance Sitting-balance support: No upper extremity supported, Feet supported, Bilateral upper extremity supported Sitting balance-Leahy Scale: Fair Sitting balance - Comments: Pt writhing in pain and twisting and shifting weight frequently while sitting EOB, intermittent UE support seemingly more to manage the pain than for balance, CGA for safety       Standing balance comment: unable to stand due to severe pain this date                            Communication Communication Communication: No apparent difficulties  Cognition Arousal: Alert Behavior During Therapy: WFL for tasks assessed/performed   PT - Cognitive impairments: No apparent impairments                         Following commands: Intact      Cueing Cueing Techniques: Verbal cues, Tactile cues  Exercises General Exercises - Lower Extremity Long Arc Quad: AROM, Left, Other reps (comment), Seated (x4) Other Exercises Other Exercises: x1 L Epley maneuver using the bed features due to his painful back    General Comments General comments (skin integrity, edema, etc.): R Horizontal Canal Test = negative; R Dix Hallpike Test (using bed features to perform due to pt's back pain)= negative; L [employer] (using bed features to perform due to pt's back pain)= positive for symptom reproduction and upbeating L rotary nystagmus      Pertinent Vitals/Pain Pain Assessment Pain Assessment: 0-10 Pain Score: 10-Worst pain ever Pain Location: back Pain Descriptors / Indicators: Grimacing, Guarding, Aching, Moaning Pain Intervention(s): Monitored during session,  Limited activity within patient's tolerance, Repositioned, RN gave pain meds during session    Home Living                          Prior Function            PT Goals (current goals can now be found in the care plan section) Acute Rehab PT Goals Patient Stated Goal: return home PT Goal Formulation: With patient/family Time For Goal Achievement: 01/04/25 Potential to Achieve Goals: Good Progress towards PT goals: Progressing toward goals    Frequency    Min 2X/week      PT Plan      Co-evaluation              AM-PAC PT 6 Clicks Mobility   Outcome Measure  Help needed turning from your back to your side while in a flat bed without using bedrails?: A Little Help needed moving from lying on your back to sitting on the side of a  flat bed without using bedrails?: A Lot Help needed moving to and from a bed to a chair (including a wheelchair)?: Total Help needed standing up from a chair using your arms (e.g., wheelchair or bedside chair)?: Total Help needed to walk in hospital room?: Total Help needed climbing 3-5 steps with a railing? : Total 6 Click Score: 9    End of Session   Activity Tolerance: Patient limited by pain Patient left: in bed;with call bell/phone within reach;with bed alarm set;with family/visitor present Nurse Communication: Mobility status;Other (comment);Patient requests pain meds (vestibular issue) PT Visit Diagnosis: Other abnormalities of gait and mobility (R26.89);Unsteadiness on feet (R26.81);Muscle weakness (generalized) (M62.81);Dizziness and giddiness (R42);Pain;BPPV BPPV - Right/Left : Left Pain - Right/Left:  (back) Pain - part of body:  (back)     Time: 8367-8295 PT Time Calculation (min) (ACUTE ONLY): 32 min  Charges:    $Therapeutic Activity: 8-22 mins $Canalith Rep Proc: 8-22 mins PT General Charges $$ ACUTE PT VISIT: 1 Visit                     Theo Ferretti, PT, DPT Acute Rehabilitation Services  Office:  [phone]    Theo CHRISTELLA Ferretti 12/23/2024, 5:21 PM

## 2024-12-23 NOTE — Assessment & Plan Note (Signed)
-  PT and OT

## 2024-12-23 NOTE — Assessment & Plan Note (Signed)
 Continue blood pressure monitoring  Continue metoprolol  50 mg succinate bid .

## 2024-12-23 NOTE — Assessment & Plan Note (Addendum)
 Hyponatremia and hypokalemia   Pre HD today K was 4.3 with serum bicarbonate at 28 and Na 131 Continue renal replacement therapy per nephrology recommendations   Anemia of chronic renal disease.

## 2024-12-23 NOTE — Progress Notes (Signed)
 PT Cancellation Note  Patient Details Name: Brendan [name]. MRN: [id number] DOB: [birth year]   Cancelled Treatment:    Reason Eval/Treat Not Completed: Patient at procedure or test/unavailable. Pt currently off unit at HD. Will check back as schedule allows to continue with PT POC.    Anona Giovannini D Detria Cummings 12/23/2024, 7:17 AM  Leita Sable, PT, DPT Acute Rehabilitation Services Secure Chat Preferred Office: [phone]

## 2024-12-23 NOTE — Plan of Care (Signed)
   Problem: Education: Goal: Ability to describe self-care measures that may prevent or decrease complications (Diabetes Survival Skills Education) will improve Outcome: Progressing Goal: Individualized Educational Video(s) Outcome: Progressing   Problem: Coping: Goal: Ability to adjust to condition or change in health will improve Outcome: Progressing

## 2024-12-23 NOTE — Inpatient Diabetes Management (Signed)
 Inpatient Diabetes Program Recommendations  AACE/ADA: New Consensus Statement on Inpatient Glycemic Control (2015)  Target Ranges:  Prepandial:   less than 140 mg/dL      Peak postprandial:   less than 180 mg/dL (1-2 hours)      Critically ill patients:  140 - 180 mg/dL   Lab Results  Component Value Date   GLUCAP 283 (H) 12/23/2024   HGBA1C 8.3 (H) 12/20/2024    Latest Reference Range & Units 12/22/24 07:52 12/22/24 11:48 12/22/24 15:39 12/22/24 20:26 12/23/24 00:09 12/23/24 02:57 12/23/24 04:27  Glucose-Capillary 70 - 99 mg/dL 786 (H) 779 (H) 726 (H) 226 (H) 302 (H) 291 (H) 283 (H)  (H): Data is abnormally high  Diabetes history: DM  Outpatient Diabetes medications:  FSL 3 Semglee  30 units daily Humalog  2 units tid with meals Mounjaro  5 mg weekly Current orders for Inpatient glycemic control:  Novolog  0-6 units q 4 hours Semglee  15 units q HS Inpatient Diabetes Program Recommendations:     Please consider: -Add Novolog  meal coverage 2 units tid with meals.   Thank you, Kista Robb E. Makhya Arave, RN, MSN, CNS, CDCES  Diabetes Coordinator Inpatient Glycemic Control Team Team Pager [phone] (8am-5pm) 12/23/2024 10:08 AM

## 2024-12-23 NOTE — Assessment & Plan Note (Signed)
Last A1C 02/03/21 6.2%  Plan A1C  Sliding scale coverage  Farixga 10  mg daily 

## 2024-12-23 NOTE — Progress Notes (Signed)
" °   12/23/24 0945  Vitals  Temp 97.9 F (36.6 C)  Pulse Rate (!) 57  Resp 16  BP (!) 140/78  SpO2 98 %  O2 Device Room Air  Weight (S)  120 kg (Bed Scale)  Type of Weight Post-Dialysis  Oxygen Therapy  Patient Activity (if Appropriate) In bed  Pulse Oximetry Type Continuous  Post Treatment  Dialyzer Clearance Clear  Hemodialysis Intake (mL) 0 mL  Liters Processed 78  Fluid Removed (mL) 2500 mL  Tolerated HD Treatment Yes  Post-Hemodialysis Comments Tx. completed without difficulties. Pt. voice no complaints. Report call to3W bedside RN. Admin medication per order.   Received patient in bed to unit.  Alert and oriented.  Informed consent signed and in chart.   TX duration:3.25  Patient tolerated well.  Transported back to the room  Alert, without acute distress.  Hand-off given to patient's nurse.   Access used: Yes Access issues: No  Total UF removed: 2500 Medication(s) given: See MAR Post HD VS: See Above GRID Post HD weight: 120 kg   [name] "

## 2024-12-23 NOTE — Assessment & Plan Note (Signed)
 Continue antibiotic therapy with IV vancomycin  and cefepime  Follow up on cultures from bone Sed rate 75 CRP 15.3   Will consult ID for further recommendations, he will need prolong course of antibiotics

## 2024-12-23 NOTE — Assessment & Plan Note (Signed)
Calculated BMI is 33,9  

## 2024-12-23 NOTE — Progress Notes (Signed)
Pt arrived back on unit from dialysis.

## 2024-12-23 NOTE — Care Management Important Message (Signed)
 Important Message  Patient Details  Name: Brendan [name]. MRN: [id number] Date of Birth: [birth year]   Important Message Given:  Yes - Medicare IM     Claretta Deed 12/23/2024, 3:47 PM

## 2024-12-23 NOTE — Progress Notes (Signed)
" °  Progress Note   Patient: Brendan [name]. FMW:[id number] DOB: [birth year] DOA: 12/20/2024     3 DOS: the patient was seen and examined on 12/23/2024   Brief hospital course: Naman Spychalski. is an 69 y.o. male with past medical history of end-stage renal disease on hemodialysis, diabetes mellitus type 2, traumatic brain injury, history of HFpEF, Fournier's gangrene, History of MSSA bacteremia thought to be from his HD catheter line comes in with a acute on chronic back pain which has gotten worse over the last several days denies any fever chills, MRI of the thoracic and lumbar spine showed T12 compression fracture with discitis and osteomyelitis of T11 and T12 and possibly L1 and L2 with septic facet arthropathy of L4-L5 and possibly L5-S1 neurosurgery was consulted and recommended IR consultation.   Assessment and Plan: * Vertebral osteomyelitis (HCC) Continue antibiotic therapy with IV vancomycin  and cefepime  Follow up on cultures from bone Sed rate 75 CRP 15.3   Will consult ID for further recommendations, he will need prolong course of antibiotics   ESRD on dialysis (HCC) Hyponatremia and hypokalemia   Pre HD today K was 4.3 with serum bicarbonate at 28 and Na 131 Continue renal replacement therapy per nephrology recommendations   Anemia of chronic renal disease.    Hypertension associated with diabetes (HCC) Continue blood pressure monitoring  Insulin -requiring or dependent type II diabetes mellitus (HCC) Continue glucose cover and monitoring with insulin  sliding scale   History of traumatic brain injury PT and OT  Acute on chronic diastolic CHF (congestive heart failure) (HCC) No signs of volume overload, continue ultrafiltration with HD   Hypothyroidism Continue levothyroxine   Obesity, class 1 Calculated BMI is 33,9   Malnutrition of moderate degree Continue nutritional supplements         Subjective: Patient with no chest pain or dyspnea, continue to have back  pain, worse with movement   Physical Exam: Vitals:   12/23/24 0718 12/23/24 0805 12/23/24 0918 12/23/24 0945  BP:  (!) 144/75 138/78 (!) 140/78  Pulse: (!) 54 (!) 56 (!) 54 (!) 57  Resp: 18 16 16 16   Temp:    97.9 F (36.6 C)  TempSrc:      SpO2: 100% 97% 96% 98%  Weight:    (S) 120 kg  Height:       Neurology awake and alert ENT with mild pallor Cardiovascular with S1 and S2 present and regular with no gallops or rubs Respiratory with no rales or wheezing, no rhonchi Abdomen with no distention No lower extremity edema   Data Reviewed:    Family Communication: no family at the bedside   Disposition: Status is: Inpatient Remains inpatient appropriate because: IV antibiotics   Planned Discharge Destination: Skilled nursing facility     Author: Elidia Toribio Furnace, MD 12/23/2024 10:52 AM  For on call review [email or web address].  "

## 2024-12-23 NOTE — Assessment & Plan Note (Signed)
Continue nutritional supplements. °

## 2024-12-23 NOTE — Hospital Course (Signed)
 Brendan [name]. is an 69 y.o. male with past medical history of end-stage renal disease on hemodialysis, diabetes mellitus type 2, traumatic brain injury, history of HFpEF, Fournier's gangrene, History of MSSA bacteremia thought to be from his HD catheter line comes in with a acute on chronic back pain which has gotten worse over the last several days denies any fever chills, MRI of the thoracic and lumbar spine showed T12 compression fracture with discitis and osteomyelitis of T11 and T12 and possibly L1 and L2 with septic facet arthropathy of L4-L5 and possibly L5-S1 neurosurgery was consulted and recommended IR consultation.

## 2024-12-23 NOTE — Assessment & Plan Note (Signed)
 Continue levothyroxine 

## 2024-12-24 DIAGNOSIS — S22089A Unspecified fracture of T11-T12 vertebra, initial encounter for closed fracture: Secondary | ICD-10-CM

## 2024-12-24 DIAGNOSIS — M545 Low back pain, unspecified: Principal | ICD-10-CM

## 2024-12-24 DIAGNOSIS — I152 Hypertension secondary to endocrine disorders: Secondary | ICD-10-CM | POA: Diagnosis not present

## 2024-12-24 DIAGNOSIS — M4624 Osteomyelitis of vertebra, thoracic region: Secondary | ICD-10-CM

## 2024-12-24 DIAGNOSIS — J810 Acute pulmonary edema: Secondary | ICD-10-CM | POA: Diagnosis not present

## 2024-12-24 DIAGNOSIS — E1159 Type 2 diabetes mellitus with other circulatory complications: Secondary | ICD-10-CM | POA: Diagnosis not present

## 2024-12-24 DIAGNOSIS — M899 Disorder of bone, unspecified: Secondary | ICD-10-CM | POA: Diagnosis not present

## 2024-12-24 DIAGNOSIS — Z992 Dependence on renal dialysis: Secondary | ICD-10-CM | POA: Diagnosis not present

## 2024-12-24 DIAGNOSIS — N186 End stage renal disease: Secondary | ICD-10-CM | POA: Diagnosis not present

## 2024-12-24 DIAGNOSIS — M4620 Osteomyelitis of vertebra, site unspecified: Secondary | ICD-10-CM | POA: Diagnosis not present

## 2024-12-24 DIAGNOSIS — M4644 Discitis, unspecified, thoracic region: Secondary | ICD-10-CM

## 2024-12-24 DIAGNOSIS — M48061 Spinal stenosis, lumbar region without neurogenic claudication: Secondary | ICD-10-CM

## 2024-12-24 DIAGNOSIS — B964 Proteus (mirabilis) (morganii) as the cause of diseases classified elsewhere: Secondary | ICD-10-CM

## 2024-12-24 LAB — CBC
HCT: 24.3 % — ABNORMAL LOW (ref 39.0–52.0)
Hemoglobin: 8 g/dL — ABNORMAL LOW (ref 13.0–17.0)
MCH: 27 pg (ref 26.0–34.0)
MCHC: 32.9 g/dL (ref 30.0–36.0)
MCV: 82.1 fL (ref 80.0–100.0)
Platelets: 397 10*3/uL (ref 150–400)
RBC: 2.96 MIL/uL — ABNORMAL LOW (ref 4.22–5.81)
RDW: 17.4 % — ABNORMAL HIGH (ref 11.5–15.5)
WBC: 15.1 10*3/uL — ABNORMAL HIGH (ref 4.0–10.5)
nRBC: 0 % (ref 0.0–0.2)

## 2024-12-24 LAB — BASIC METABOLIC PANEL WITH GFR
Anion gap: 9 (ref 5–15)
BUN: 22 mg/dL (ref 8–23)
CO2: 30 mmol/L (ref 22–32)
Calcium: 8.6 mg/dL — ABNORMAL LOW (ref 8.9–10.3)
Chloride: 94 mmol/L — ABNORMAL LOW (ref 98–111)
Creatinine, Ser: 4.19 mg/dL — ABNORMAL HIGH (ref 0.61–1.24)
GFR, Estimated: 15 mL/min — ABNORMAL LOW
Glucose, Bld: 231 mg/dL — ABNORMAL HIGH (ref 70–99)
Potassium: 4.2 mmol/L (ref 3.5–5.1)
Sodium: 133 mmol/L — ABNORMAL LOW (ref 135–145)

## 2024-12-24 LAB — GLUCOSE, CAPILLARY
Glucose-Capillary: 116 mg/dL — ABNORMAL HIGH (ref 70–99)
Glucose-Capillary: 179 mg/dL — ABNORMAL HIGH (ref 70–99)
Glucose-Capillary: 192 mg/dL — ABNORMAL HIGH (ref 70–99)
Glucose-Capillary: 260 mg/dL — ABNORMAL HIGH (ref 70–99)
Glucose-Capillary: 276 mg/dL — ABNORMAL HIGH (ref 70–99)
Glucose-Capillary: 296 mg/dL — ABNORMAL HIGH (ref 70–99)

## 2024-12-24 MED ORDER — INSULIN ASPART 100 UNIT/ML IJ SOLN
0.0000 [IU] | Freq: Three times a day (TID) | INTRAMUSCULAR | Status: DC
  Administered 2024-12-24: 5 [IU] via SUBCUTANEOUS
  Administered 2024-12-25: 7 [IU] via SUBCUTANEOUS
  Filled 2024-12-24: qty 7
  Filled 2024-12-24: qty 5

## 2024-12-24 MED ORDER — FOLIC ACID 1 MG PO TABS
1.0000 mg | ORAL_TABLET | Freq: Every day | ORAL | Status: AC
  Administered 2024-12-24 – 2024-12-25 (×2): 1 mg via ORAL
  Filled 2024-12-24 (×2): qty 1

## 2024-12-24 MED ORDER — CHLORHEXIDINE GLUCONATE CLOTH 2 % EX PADS
6.0000 | MEDICATED_PAD | Freq: Every day | CUTANEOUS | Status: AC
  Administered 2024-12-25: 6 via TOPICAL

## 2024-12-24 NOTE — Progress Notes (Signed)
 Patient ID: Brendan Narula., male   DOB: 03/10/1956, 69 y.o.   MRN: [id number] Awake alert reasonably comfortable and maybe some improvement in his back pain.  This may be an effect of the antibiotics.  Continue to await culture results.

## 2024-12-24 NOTE — Plan of Care (Signed)
  Problem: Education: Goal: Ability to describe self-care measures that may prevent or decrease complications (Diabetes Survival Skills Education) will improve Outcome: Progressing Goal: Individualized Educational Video(s) Outcome: Progressing   Problem: Coping: Goal: Ability to adjust to condition or change in health will improve Outcome: Progressing   Problem: Education: Goal: Ability to describe self-care measures that may prevent or decrease complications (Diabetes Survival Skills Education) will improve Outcome: Progressing Goal: Individualized Educational Video(s) Outcome: Progressing   Problem: Coping: Goal: Ability to adjust to condition or change in health will improve Outcome: Progressing

## 2024-12-24 NOTE — Progress Notes (Signed)
 [facility] Progress Note  Subjective:  Seen in room Waiting for PT, will try to get him up in the chair Wondering why he is having such back pain when trying to sit up  Presentation summary: pending   Vitals:   12/23/24 1950 12/24/24 0514 12/24/24 0804 12/24/24 1215  BP: (!) 152/70 (!) 162/79 (!) 165/78 (!) 147/68  Pulse: (!) 59 (!) 58  (!) 53  Resp: 16 18 20 20   Temp: 98.4 F (36.9 C) 98.3 F (36.8 C) (!) 97.3 F (36.3 C) (!) 97.5 F (36.4 C)  TempSrc: Oral Oral Oral Oral  SpO2: 95% 95% 95% 97%  Weight:      Height:        Exam: General: Lying flat in bed, on nasal oxygen, nad  Head: NCAT sclera not icteric MMM CV: Regular rate, no murmur, no rub  Pulm: normal respiratory effort, lungs clear  Abdomen: non-tender, no masses  Lower extremities: trace edema  Neuro: A & O X 3. Moves all extremities spontaneously. Psych:  Responds to questions appropriately with a normal affect. Dialysis Access: L chest TDC     OP HD: [facility] MWF 4h  122.9kg  B400  2K bath  TDC  Heparin  2500  -Mircera 150 q 2 wks (last 1/21) -Venofer  100 mg IV x 5  -Calcitriol  0.25 q HD  CXR - sig bilat pulm edema, R infiltrates also may be edema   Assessment/ Plan: Back pain- H/o spinal stenosis. CT notable for lytic lesions/fractures to T12 vertebral body. S/P IR procedure 2/03. Suspected vertebral osteomyelitis, getting empiric IV abx w/ vanc/ cefepime  per pmd.   Volume/ SOB-  pulmonary edema on admit, resolved w/ HD, under dry wt. Cont to lower vol 2-2.5 L w/ next HD as tolerated.  ESRD-  HD MWF. Had HD here Sunday/ Wed. Next HD Friday.  Hypertension- getting his home meds. BP's stable today Anemia- Hgb 8- 10 here. Follow.    Myer Fret MD  [facility] 12/24/2024, 1:54 PM  Recent Labs  Lab 12/21/24 0300 12/23/24 0113 12/24/24 0223  HGB 8.5* 8.1* 8.0*  ALBUMIN  2.9* 2.7*  --   CALCIUM 8.3* 8.6* 8.6*  PHOS 4.2 6.2*  --   CREATININE 3.58* 5.37* 4.19*  K 3.4* 4.3 4.2   Recent Labs   Lab 12/20/24 2042  IRON  32*  TIBC 165*  FERRITIN 798*   Inpatient medications:  amLODipine   5 mg Oral Daily   calcitRIOL   0.25 mcg Oral Q M,W,F-HD   carvedilol   6.25 mg Oral BID WC   Chlorhexidine  Gluconate Cloth  6 each Topical Q0600   Chlorhexidine  Gluconate Cloth  6 each Topical Q0600   Chlorhexidine  Gluconate Cloth  6 each Topical Q0600   darbepoetin (ARANESP ) injection - DIALYSIS  150 mcg Subcutaneous Q Wed-1800   feeding supplement (NEPRO CARB STEADY)  237 mL Oral BID BM   insulin  aspart  0-6 Units Subcutaneous Q4H   insulin  aspart  2 Units Subcutaneous TID WC   insulin  glargine-yfgn  15 Units Subcutaneous QHS   levothyroxine   125 mcg Oral q AM   multivitamin  1 tablet Oral QHS   scopolamine   1 patch Transdermal Q72H   simvastatin   20 mg Oral QPM   sodium chloride  flush  3 mL Intravenous Q12H    ceFEPime  (MAXIPIME ) IV 1 g (12/23/24 2115)   vancomycin  1,000 mg (12/23/24 2154)   acetaminophen  **OR** acetaminophen , HYDROmorphone  (DILAUDID ) injection, ondansetron  **OR** ondansetron  (ZOFRAN ) IV, oxyCODONE , sodium chloride  flush, traMADol 

## 2024-12-24 NOTE — Progress Notes (Addendum)
 " PROGRESS NOTE    Brendan [name].  FMW:[id number] DOB: August 30, 1956 DOA: 12/20/2024 PCP: Rexanne Ingle, MD    Chief Complaint  Patient presents with   Back Pain    Brief Narrative:   Brendan [name]. Is a 69 year old male with past medical history of insulin -requiring type 2 diabetes mellitus, hypothyroidism, hyperaldosteronism, fatty liver, venous insufficiency, hypertension, obesity class I, obstructive sleep apnea, chronic diastolic CHF, end-stage renal disease on dialysis, anemia of chronic disease, prostate cancer, psoriasis, necrotizing fasciitis, and spinal stenosis. He presented to the ED on 2/1 due to intolerable lower back pain associated with newly diagnosed spinal stenosis. He notes shortness of breath/dry heaving x few days. He has had some abdominal pain as well. Walking is difficult due to pain. On EMS arrival, SpO2 was 80% on room air and 90% on 6L [place]. Lungs were reportedly clear to auscultation.     Assessment & Plan: Vertebral Osteomyelitis WBC 15.1. Neutrophils 10. ESR 75. Blood culture negative. Awaiting disc aspiration.  MRI lumbar spine shows T12 compression fracture with superimposed discitis osteomyelitis. Septic facet arthropathy. Severe central canal stenosis.  CT abdomen/pelvis shows bilateral moderate pleural effusions with associated compressive atelectatic changes (superimposed pneumonia)  Continue Dilaudid , oxycodone , vancomycin , cefepime . ID consult.  Surgery not recommended at this time.   Acute on Chronic Diastolic CHF EKG shows sinus rhythm with prolonged QT interval.  Echocardiogram shows LV EF 45-50%, Grade I LV diastolic dysfunction, and mild RV systolic function.  Continue dialysis for volume management.   ESRD on Dialysis  Anemia of Chronic Disease Hyponatremia Hypocalcemia Baseline - GFR 15. Creatinine 4.19.  Hgb 8.0. Vitamin B12 1,198. Folate 3.3. Ferritin 798. Iron  32.  Sodium 133. Calcium 8.6.  Continue darbepoetin alfa  and diuresis. Start  folate supplementation.  Transfuse if Hgb <7.   Insulin -dependent T2DM CBG average 200s Not well-controlled, but stable.  Continue SSI   Hypertension SBP 137-165 Stable.  Continue amlodipine    Obesity, Class I BMI 33.97  Outpatient follow-up   Malnutrition of Moderate Degree  Continue Nepro Carb Steady.   Hypothyroidism TSH normal. T3 low, T4 normal.  Continue levothyroxine .   Hyperlipidemia Continue statin therapy.   DVT prophylaxis: none Code Status: Full  Family Communication: none at bedside.  Disposition:   Status is: Inpatient Remains inpatient appropriate because: severity of illness    Consultants:  Infectious Disease Nephrology Neurosurgery Radiology  Procedures: T11-T12 disc aspiration  Antimicrobials: cefepime , vancomycin     Subjective:  Patient is lying comfortably in bed. States back pain is an 8/10. Wants to try sit upright in chair. No chest pain, minimal shortness of breath.    Objective: Vitals:   12/23/24 1945 12/23/24 1950 12/24/24 0514 12/24/24 0804  BP:  (!) 152/70 (!) 162/79 (!) 165/78  Pulse: 61 (!) 59 (!) 58   Resp:  16 18 20   Temp:  98.4 F (36.9 C) 98.3 F (36.8 C) (!) 97.3 F (36.3 C)  TempSrc:  Oral Oral Oral  SpO2: 97% 95% 95% 95%  Weight:      Height:        Intake/Output Summary (Last 24 hours) at 12/24/2024 1101 Last data filed at 12/24/2024 1039 Gross per 24 hour  Intake 3 ml  Output --  Net 3 ml   Filed Weights   12/20/24 1744 12/23/24 0503 12/23/24 0945  Weight: 115.7 kg 123 kg (S) 120 kg    Examination:  General exam: Appears calm and comfortable  Respiratory system: Anterior lungs clear to auscultation -  patient did not want to sit upright, could not auscultate posterior lungs. Respiratory effort normal. Cardiovascular system: S1 & S2 heard, RRR. No JVD, murmurs, rubs, gallops or clicks. No pedal edema. Gastrointestinal system: Abdomen is nondistended, soft and nontender. No organomegaly or masses  felt. Normal bowel sounds heard. Central nervous system: Alert and oriented. No focal neurological deficits. Extremities: Symmetric 5 x 5 power. Skin: No rashes, lesions or ulcers Psychiatry: Judgement and insight appear normal. Mood & affect appropriate.     Data Reviewed: I have personally reviewed following labs and imaging studies.  CBC: Recent Labs  Lab 12/20/24 1345 12/20/24 1411 12/21/24 0300 12/23/24 0113 12/24/24 0223  WBC 12.4*  --  12.2* 12.8* 15.1*  NEUTROABS 10.0*  --   --  10.0*  --   HGB 9.0* 9.5* 8.5* 8.1* 8.0*  HCT 27.8* 28.0* 26.4* 25.0* 24.3*  MCV 82.7  --  83.8 83.6 82.1  PLT 410*  --  409* 413* 397    Basic Metabolic Panel: Recent Labs  Lab 12/20/24 1411 12/20/24 1512 12/21/24 0300 12/23/24 0113 12/24/24 0223  NA 131* 131* 131* 131* 133*  K 4.4 3.7 3.4* 4.3 4.2  CL 91* 90* 91* 93* 94*  CO2  --  28 27 28 30   GLUCOSE 272* 270* 161* 301* 231*  BUN 35* 26* 16 28* 22  CREATININE 5.60* 5.12* 3.58* 5.37* 4.19*  CALCIUM  --  8.6* 8.3* 8.6* 8.6*  MG  --   --  2.0  --   --   PHOS  --   --  4.2 6.2*  --     GFR: Estimated Creatinine Clearance: 22.9 mL/min (A) (by C-G formula based on SCr of 4.19 mg/dL (H)).  Liver Function Tests: Recent Labs  Lab 12/20/24 1512 12/21/24 0300 12/23/24 0113  AST 11* 10*  --   ALT <5 <5  --   ALKPHOS 104 104  --   BILITOT 0.5 0.6  --   PROT 7.6 7.8  --   ALBUMIN  2.8* 2.9* 2.7*    CBG: Recent Labs  Lab 12/23/24 1602 12/23/24 2048 12/24/24 0007 12/24/24 0315 12/24/24 0815  GLUCAP 169* 291* 296* 192* 116*     Recent Results (from the past 240 hours)  Resp panel by RT-PCR (RSV, Flu A&B, Covid) Anterior Nasal Swab     Status: None   Collection Time: 12/20/24  1:45 PM   Specimen: Anterior Nasal Swab  Result Value Ref Range Status   SARS Coronavirus 2 by RT PCR NEGATIVE NEGATIVE Final   Influenza A by PCR NEGATIVE NEGATIVE Final   Influenza B by PCR NEGATIVE NEGATIVE Final    Comment: (NOTE) The Xpert  Xpress SARS-CoV-2/FLU/RSV plus assay is intended as an aid in the diagnosis of influenza from Nasopharyngeal swab specimens and should not be used as a sole basis for treatment. Nasal washings and aspirates are unacceptable for Xpert Xpress SARS-CoV-2/FLU/RSV testing.  Fact Sheet for Patients: bloggercourse.com  Fact Sheet for Healthcare Providers: seriousbroker.it  This test is not yet approved or cleared by the [place]  FDA and has been authorized for detection and/or diagnosis of SARS-CoV-2 by FDA under an Emergency Use Authorization (EUA). This EUA will remain in effect (meaning this test can be used) for the duration of the COVID-19 declaration under Section 564(b)(1) of the Act, 21 U.S.C. section 360bbb-3(b)(1), unless the authorization is terminated or revoked.     Resp Syncytial Virus by PCR NEGATIVE NEGATIVE Final    Comment: (NOTE) Fact Sheet  for Patients: bloggercourse.com  Fact Sheet for Healthcare Providers: seriousbroker.it  This test is not yet approved or cleared by the [place]  FDA and has been authorized for detection and/or diagnosis of SARS-CoV-2 by FDA under an Emergency Use Authorization (EUA). This EUA will remain in effect (meaning this test can be used) for the duration of the COVID-19 declaration under Section 564(b)(1) of the Act, 21 U.S.C. section 360bbb-3(b)(1), unless the authorization is terminated or revoked.  Performed at [facility], [address], [place], [place] [address]   Blood culture (routine x 2)     Status: None (Preliminary result)   Collection Time: 12/20/24  8:42 PM   Specimen: BLOOD RIGHT HAND  Result Value Ref Range Status   Specimen Description BLOOD RIGHT HAND  Final   Special Requests   Final    BOTTLES DRAWN AEROBIC AND ANAEROBIC Blood Culture results may not be optimal due to an inadequate volume of blood  received in culture bottles   Culture   Final    NO GROWTH 4 DAYS Performed at [facility], [address] [place], [place] [address]    Report Status PENDING  Incomplete  Blood culture (routine x 2)     Status: None (Preliminary result)   Collection Time: 12/20/24  8:49 PM   Specimen: BLOOD  Result Value Ref Range Status   Specimen Description BLOOD LEFT ANTECUBITAL  Final   Special Requests   Final    BOTTLES DRAWN AEROBIC AND ANAEROBIC Blood Culture adequate volume   Culture   Final    NO GROWTH 4 DAYS Performed at [facility], [address], [place], [place] [address]    Report Status PENDING  Incomplete  Aerobic/Anaerobic Culture w Gram Stain (surgical/deep wound)     Status: None (Preliminary result)   Collection Time: 12/22/24  1:20 PM   Specimen: Abscess  Result Value Ref Range Status   Specimen Description ABSCESS  Final   Special Requests INTERVERTEBRAL DISC  Final   Gram Stain   Final    ABUNDANT WBC PRESENT, PREDOMINANTLY PMN NO ORGANISMS SEEN    Culture   Final    NO GROWTH 2 DAYS Performed at [facility], [address], [place], [place] [address]    Report Status PENDING  Incomplete      Radiology Studies: IR THORACIC DISC ASPIRATION W/IMG GUIDE Result Date: 12/22/2024 INDICATION: 69 year old male with history of T11-T12 discitis osteomyelitis. EXAM: T11-T12 DISC ASPIRATION UNDER FLUOROSCOPY MEDICATIONS: Lidocaine  1% subcutaneous ANESTHESIA/SEDATION: Moderate (conscious) sedation was employed during this procedure. A total of Versed  1 mg and Fentanyl  75 mcg was administered intravenously. Moderate Sedation Time: 5 minutes. The patient's level of consciousness and vital signs were monitored continuously by radiology nursing throughout the procedure under my direct supervision. PROCEDURE: Informed written consent was obtained from the patient after a thorough discussion of the procedural risks, benefits and alternatives. All questions were  addressed. Maximal Sterile Barrier Technique was utilized including caps, mask, sterile gowns, sterile gloves, sterile drape, hand hygiene and skin antiseptic. A timeout was performed prior to the initiation of the procedure. The appropriate interspace was identified under fluoroscopy, corresponding to previous cross-sectional imaging. An appropriate skin entry site was determined. After local infiltration with 1% lidocaine , an 18 gauge trocar needle was advanced into the interspace from right posterolateral extraforaminal approach. Needle tip position within the interspace confirmed on biplane images. Approximately 1 cc purulent fluid was aspirated, sent for the requested  laboratory studies. The needle was removed. A sterile bandage was placed. The patient tolerated the procedure well. FLUOROSCOPY TIME:  34 mGy reference air Kerma COMPLICATIONS: None immediate. IMPRESSION: Technically successful fluoroscopic guided T11-12 disc aspiration under fluoroscopy. Ester Sides, MD [facility] Electronically Signed   By: Ester Sides M.D.   On: 12/22/2024 15:26     Scheduled Meds:  amLODipine   5 mg Oral Daily   calcitRIOL   0.25 mcg Oral Q M,W,F-HD   carvedilol   6.25 mg Oral BID WC   Chlorhexidine  Gluconate Cloth  6 each Topical Q0600   Chlorhexidine  Gluconate Cloth  6 each Topical Q0600   Chlorhexidine  Gluconate Cloth  6 each Topical Q0600   darbepoetin (ARANESP ) injection - DIALYSIS  150 mcg Subcutaneous Q Wed-1800   feeding supplement (NEPRO CARB STEADY)  237 mL Oral BID BM   insulin  aspart  0-6 Units Subcutaneous Q4H   insulin  aspart  2 Units Subcutaneous TID WC   insulin  glargine-yfgn  15 Units Subcutaneous QHS   levothyroxine   125 mcg Oral q AM   multivitamin  1 tablet Oral QHS   scopolamine   1 patch Transdermal Q72H   simvastatin   20 mg Oral QPM   sodium chloride  flush  3 mL Intravenous Q12H   Continuous Infusions:  ceFEPime  (MAXIPIME )  IV 1 g (12/23/24 2115)   vancomycin  1,000 mg (12/23/24 2154)     LOS: 4 days    Time spent:     Orland Born, Student PA [facility]   To contact the attending provider between 7A-7P or the covering provider during after hours 7P-7A, please log into the web site [email or web address] and access using universal [place] password for that web site. If you do not have the password, please call the hospital operator.  12/24/2024, 11:01 AM   "

## 2024-12-24 NOTE — Consult Note (Signed)
 "                                                                  [facility]                                                                                        Patient Identification: Patient Name: Kieron Kantner. MRN: [id number] Admit Date: 12/20/2024 12:21 PM Today's Date: 12/24/2024 Reason for consult:  Requesting provider:   Principal Problem:   Vertebral osteomyelitis (HCC) Active Problems:   Insulin -requiring or dependent type II diabetes mellitus (HCC)   Hypertension associated with diabetes (HCC)   Obesity, class 1   Acute on chronic diastolic CHF (congestive heart failure) (HCC)   Hypothyroidism   ESRD on dialysis (HCC)   History of traumatic brain injury   Malnutrition of moderate degree   Antibiotics Vancomycin  2/1- Cefepime  2/1-  Lines/Hardware:  Assessment # T11-T12 discitis/osteomyelitis, possibly at L1-L2, septic arthritis at L4-L5 and L5-S1 - 2/3 T11-T12 disc aspiration ( 2 days into Vancomycin  and cefepime ).  No organism on Gram stain.  Culture no growth in 2 days.   # Recent MSSA/Proteus mirabilis bacteremia/CLABSI - s/p completion of course of levofloxacin  and cefazolin   # Moderate to severe central canal stenosis, left foraminal narrowing at L4-L5  Recommendations  - Continue vancomycin , cefepime  - Plan for 6 weeks of IV vancomycin /cefepime  with HD.  Do not expect IR aspirate cultures will have growth as aspirated 2 days into broad-spectrum antibiotics - Fu OR cx to completion  - Monitor CBC, BMP, ESR and CRP - Universal isolation precautions D/W primary team  Rest of the management as per the primary team. Please call with questions or concerns.  Thank you for the consult  __________________________________________________________________________________________________________ HPI and Hospital Course: 69 year old male with prior history of ESRD on HD, DM2, TBI, CHF, Fournier's gangrene, hypothyroidism, HLD, OSA,  AOCD, spinal stenosis, psoriasis, recent history of MSSA/Proteus bacteremia/possible CLABSI with HDC removed/ s/p completion of antibiotics who presented to the ED with acute worsening of chronic back pain on 2/1.  No reported trauma or .  He was not able to ambulate for 2 days prior to arrival.  Reported history of nausea and dry heaves.  No reported fever or chills  At ED hypertensive Labs remarkable for WBC 12.4, hemoglobin 9.0 Influenza A/influenza B/RSV/SARS-CoV-2  Streptococcus pneumonia urinary antigen and Legionella pneumophila serogroup 1 urinary antigen negative  2/1 blood culture 2/2 sets negative No intervention per neurosurgery  ROS: General- Denies fever, chills, loss of appetite and loss of weight HEENT - Denies headache, blurry vision, neck pain, sinus pain Chest - Denies any chest pain, SOB or cough CVS- Denies any dizziness/lightheadedness, syncopal attacks, palpitations Abdomen- Denies any nausea, vomiting, abdominal pain, hematochezia and diarrhea Neuro -mild numbness in bilateral lower extremities+. Denies any focal weakness Psych - Denies any changes in mood irritability or depressive symptoms GU- Denies  any burning, dysuria, hematuria or increased frequency of urination Skin - denies any rashes/lesions MSK - denies any joint pain/swelling or restricted ROM   Past Medical History:  Diagnosis Date   Acanthosis nigricans    Atopic dermatitis    CHF (congestive heart failure) (HCC)    CKD (chronic kidney disease) stage 4, GFR 15-29 ml/min (HCC) 01/08/2023   Diabetes (HCC) 11/19/2002   Erectile dysfunction    Fatty liver 11/19/2005   Glaucoma 11/19/2006   Gynecomastia 11/20/2007   Hx of adenomatous colonic polyps 08/26/2023   Hyperaldosteronism 11/19/1998   Hypercholesterolemia    Hyperlipidemia 2010   Hypertension 1999   Hypoglycemic reaction    Hypothyroidism 11/19/2002   Incontinence    Intermittent vertigo 11/19/2010   Left cervical radiculopathy  11/19/2010   Lumbar radiculopathy    Obesity    Peripheral neuropathy    Pollen allergies 11/19/2005   perennial   Prostate cancer (HCC) 11/19/2004   Reflux esophagitis 11/19/1993   Sickle cell trait 11/19/2004   Sleep apnea, obstructive 11/19/1998   uses a cpap   Venous insufficiency 11/20/2003   Vitamin D deficiency 11/19/2010   Past Surgical History:  Procedure Laterality Date   COLONOSCOPY  2006   normal   INCISION AND DRAINAGE OF WOUND N/A 08/28/2024   Procedure: IRRIGATION AND EXCISIONAL DEBRIDEMENT WOUND;  Surgeon: Lyndel Deward PARAS, MD;  Location: [facility] OR;  Service: General;  Laterality: N/A;  EXCISIONAL DEBRIDEMENT   INCISION AND DRAINAGE PERIRECTAL ABSCESS N/A 08/30/2024   Procedure: INCISION AND DRAINAGE OF PERINEAL WOUND;  Surgeon: Polly Cordella LABOR, MD;  Location: [facility] OR;  Service: General;  Laterality: N/A;   IR REMOVAL TUN CV CATH W/O FL  11/13/2024   IR THORACIC DISC ASPIRATION W/IMG GUIDE  12/22/2024   IR TUNNELED CENTRAL VENOUS CATH PLC W IMG  09/18/2024   IR TUNNELED CENTRAL VENOUS CATH PLC W IMG  09/22/2024   IR TUNNELED CENTRAL VENOUS CATH PLC W IMG  11/17/2024   lap band surgery  2009   left inguinal hernia repair  1998   LIPOMA EXCISION Left 04/17/2023   Procedure: MINOR EXCISION LEFT BUTTOCK SEBACEOUS CYST;  Surgeon: Lyndel Deward PARAS, MD;  Location: [facility];  Service: General;  Laterality: Left;   robotic prostatectomy  2008   TRANSESOPHAGEAL ECHOCARDIOGRAM (CATH LAB) N/A 11/18/2024   Procedure: TRANSESOPHAGEAL ECHOCARDIOGRAM;  Surgeon: Santo Stanly LABOR, MD;  Location: [facility] INVASIVE CV [facility];  Service: Cardiovascular;  Laterality: N/A;   Scheduled Meds:  amLODipine   5 mg Oral Daily   calcitRIOL   0.25 mcg Oral Q M,W,F-HD   carvedilol   6.25 mg Oral BID WC   [START ON 12/25/2024] Chlorhexidine  Gluconate Cloth  6 each Topical Q0600   darbepoetin (ARANESP ) injection - DIALYSIS  150 mcg Subcutaneous Q Wed-1800   feeding supplement (NEPRO  CARB STEADY)  237 mL Oral BID BM   folic acid   1 mg Oral Daily   insulin  aspart  0-6 Units Subcutaneous Q4H   insulin  aspart  2 Units Subcutaneous TID WC   insulin  glargine-yfgn  15 Units Subcutaneous QHS   levothyroxine   125 mcg Oral q AM   multivitamin  1 tablet Oral QHS   scopolamine   1 patch Transdermal Q72H   simvastatin   20 mg Oral QPM   sodium chloride  flush  3 mL Intravenous Q12H   Continuous Infusions:  ceFEPime  (MAXIPIME ) IV 1 g (12/23/24 2115)   vancomycin  1,000 mg (12/23/24 2154)   PRN Meds:.acetaminophen  **OR** acetaminophen , HYDROmorphone  (DILAUDID )  injection, ondansetron  **OR** ondansetron  (ZOFRAN ) IV, oxyCODONE , sodium chloride  flush, traMADol   Allergies[1]  Social History   Socioeconomic History   Marital status: Married    Spouse name: Not on file   Number of children: 3   Years of education: Not on file   Highest education level: Not on file  Occupational History   Occupation: truck driver    Comment: Retired futures trader  Tobacco Use   Smoking status: Never   Smokeless tobacco: Never  Vaping Use   Vaping status: Never Used  Substance and Sexual Activity   Alcohol use: Yes    Comment: one drink every few months - 1965   Drug use: No   Sexual activity: Yes  Other Topics Concern   Not on file  Social History Narrative   Right handed   Lives in a two story home    Drinks no caffeine    Social Drivers of Health   Tobacco Use: Low Risk (12/20/2024)   Patient History    Smoking Tobacco Use: Never    Smokeless Tobacco Use: Never    Passive Exposure: Not on file  Financial Resource Strain: Low Risk (01/08/2023)   Overall Financial Resource Strain (CARDIA)    Difficulty of Paying Living Expenses: Not hard at all  Food Insecurity: No Food Insecurity (12/21/2024)   Epic    Worried About Radiation Protection Practitioner of Food in the Last Year: Never true    Ran Out of Food in the Last Year: Never true  Transportation Needs: No Transportation Needs (12/21/2024)   Epic     Lack of Transportation (Medical): No    Lack of Transportation (Non-Medical): No  Physical Activity: Not on file  Stress: Not on file  Social Connections: Socially Integrated (12/21/2024)   Social Connection and Isolation Panel    Frequency of Communication with Friends and Family: More than three times a week    Frequency of Social Gatherings with Friends and Family: More than three times a week    Attends Religious Services: More than 4 times per year    Active Member of [employer] or Organizations: Yes    Attends Banker Meetings: More than 4 times per year    Marital Status: Married  Catering Manager Violence: Not At Risk (12/21/2024)   Epic    Fear of Current or Ex-Partner: No    Emotionally Abused: No    Physically Abused: No    Sexually Abused: No  Depression (PHQ2-9): Low Risk (12/16/2024)   Depression (PHQ2-9)    PHQ-2 Score: 4  Alcohol Screen: Not on file  Housing: Low Risk (12/21/2024)   Epic    Unable to Pay for Housing in the Last Year: No    Number of Times Moved in the Last Year: 0    Homeless in the Last Year: No  Utilities: Not At Risk (12/21/2024)   Epic    Threatened with loss of utilities: No  Recent Concern: Utilities - At Risk (11/12/2024)   Epic    Threatened with loss of utilities: Yes  Health Literacy: Not on file   Family History  Problem Relation Age of Onset   Heart failure Mother    Hypertension Father        deceased age 63   Heart attack Father    COPD Sister    CVA Sister    Diabetes Daughter    Colon cancer Neg Hx    Stomach cancer Neg Hx    Colon polyps Neg  Hx    Esophageal cancer Neg Hx    Rectal cancer Neg Hx    Vitals BP (!) 147/68 (BP Location: Left Arm)   Pulse (!) 53   Temp (!) 97.5 F (36.4 C) (Oral)   Resp 20   Ht 6' 2 (1.88 m)   Wt (S) 120 kg Comment: Bed Scale  SpO2 97%   BMI 33.97 kg/m    Physical Exam Constitutional: Awake, alert, in moderate pain, working with physical therapy    Comments: HEENT  WNL  Cardiovascular:     Rate and Rhythm: Normal rate     Heart sounds: s1s2  Pulmonary:     Effort: Pulmonary effort is normal     Comments: Normal breath sounds  Abdominal:     Palpations: Abdomen is soft    Tenderness: Nondistended and nontender  Musculoskeletal:        General: No swelling or tenderness in peripheral joints  Skin:    Comments: No rashes  Neurological:     General: Grossly nonfocal  Psychiatric:        Mood and Affect: Mood normal.   Pertinent Microbiology Results for orders placed or performed during the hospital encounter of 12/20/24  Resp panel by RT-PCR (RSV, Flu A&B, Covid) Anterior Nasal Swab     Status: None   Collection Time: 12/20/24  1:45 PM   Specimen: Anterior Nasal Swab  Result Value Ref Range Status   SARS Coronavirus 2 by RT PCR NEGATIVE NEGATIVE Final   Influenza A by PCR NEGATIVE NEGATIVE Final   Influenza B by PCR NEGATIVE NEGATIVE Final    Comment: (NOTE) The Xpert Xpress SARS-CoV-2/FLU/RSV plus assay is intended as an aid in the diagnosis of influenza from Nasopharyngeal swab specimens and should not be used as a sole basis for treatment. Nasal washings and aspirates are unacceptable for Xpert Xpress SARS-CoV-2/FLU/RSV testing.  Fact Sheet for Patients: bloggercourse.com  Fact Sheet for Healthcare Providers: seriousbroker.it  This test is not yet approved or cleared by the [place]  FDA and has been authorized for detection and/or diagnosis of SARS-CoV-2 by FDA under an Emergency Use Authorization (EUA). This EUA will remain in effect (meaning this test can be used) for the duration of the COVID-19 declaration under Section 564(b)(1) of the Act, 21 U.S.C. section 360bbb-3(b)(1), unless the authorization is terminated or revoked.     Resp Syncytial Virus by PCR NEGATIVE NEGATIVE Final    Comment: (NOTE) Fact Sheet for  Patients: bloggercourse.com  Fact Sheet for Healthcare Providers: seriousbroker.it  This test is not yet approved or cleared by the [place]  FDA and has been authorized for detection and/or diagnosis of SARS-CoV-2 by FDA under an Emergency Use Authorization (EUA). This EUA will remain in effect (meaning this test can be used) for the duration of the COVID-19 declaration under Section 564(b)(1) of the Act, 21 U.S.C. section 360bbb-3(b)(1), unless the authorization is terminated or revoked.  Performed at [facility], [address], [place], [place] [address]   Blood culture (routine x 2)     Status: None (Preliminary result)   Collection Time: 12/20/24  8:42 PM   Specimen: BLOOD RIGHT HAND  Result Value Ref Range Status   Specimen Description BLOOD RIGHT HAND  Final   Special Requests   Final    BOTTLES DRAWN AEROBIC AND ANAEROBIC Blood Culture results may not be optimal due to an inadequate volume of blood received in culture bottles   Culture   Final  NO GROWTH 4 DAYS Performed at [facility], [address] [place], [place] [address]    Report Status PENDING  Incomplete  Blood culture (routine x 2)     Status: None (Preliminary result)   Collection Time: 12/20/24  8:49 PM   Specimen: BLOOD  Result Value Ref Range Status   Specimen Description BLOOD LEFT ANTECUBITAL  Final   Special Requests   Final    BOTTLES DRAWN AEROBIC AND ANAEROBIC Blood Culture adequate volume   Culture   Final    NO GROWTH 4 DAYS Performed at [facility], [address], [place], [place] [address]    Report Status PENDING  Incomplete  Aerobic/Anaerobic Culture w Gram Stain (surgical/deep wound)     Status: None (Preliminary result)   Collection Time: 12/22/24  1:20 PM   Specimen: Abscess  Result Value Ref Range Status   Specimen Description ABSCESS  Final   Special Requests INTERVERTEBRAL DISC  Final   Gram Stain    Final    ABUNDANT WBC PRESENT, PREDOMINANTLY PMN NO ORGANISMS SEEN    Culture   Final    NO GROWTH 2 DAYS Performed at [facility], [address], [place], [place] [address]    Report Status PENDING  Incomplete   Pertinent Lab seen by me:    Latest Ref Rng & Units 12/24/2024    2:23 AM 12/23/2024    1:13 AM 12/21/2024    3:00 AM  CBC  WBC 4.0 - 10.5 K/uL 15.1  12.8  12.2   Hemoglobin 13.0 - 17.0 g/dL 8.0  8.1  8.5   Hematocrit 39.0 - 52.0 % 24.3  25.0  26.4   Platelets 150 - 400 K/uL 397  413  409       Latest Ref Rng & Units 12/24/2024    2:23 AM 12/23/2024    1:13 AM 12/21/2024    3:00 AM  CMP  Glucose 70 - 99 mg/dL 768  698  838   BUN 8 - 23 mg/dL 22  28  16    Creatinine 0.61 - 1.24 mg/dL 5.80  4.62  6.41   Sodium 135 - 145 mmol/L 133  131  131   Potassium 3.5 - 5.1 mmol/L 4.2  4.3  3.4   Chloride 98 - 111 mmol/L 94  93  91   CO2 22 - 32 mmol/L 30  28  27    Calcium 8.9 - 10.3 mg/dL 8.6  8.6  8.3   Total Protein 6.5 - 8.1 g/dL   7.8   Total Bilirubin 0.0 - 1.2 mg/dL   0.6   Alkaline Phos 38 - 126 U/L   104   AST 15 - 41 U/L   10   ALT 0 - 44 U/L   <5     Pertinent Imagings/Other Imagings Plain films and CT images have been personally visualized and interpreted; radiology reports have been reviewed. Decision making incorporated into the Impression / Recommendations.  IR THORACIC DISC ASPIRATION W/IMG GUIDE Result Date: 12/22/2024 INDICATION: 69 year old male with history of T11-T12 discitis osteomyelitis. EXAM: T11-T12 DISC ASPIRATION UNDER FLUOROSCOPY MEDICATIONS: Lidocaine  1% subcutaneous ANESTHESIA/SEDATION: Moderate (conscious) sedation was employed during this procedure. A total of Versed  1 mg and Fentanyl  75 mcg was administered intravenously. Moderate Sedation Time: 5 minutes. The patient's level of consciousness and vital signs were monitored continuously by radiology nursing throughout the procedure under my direct supervision. PROCEDURE: Informed written consent  was obtained from the  patient after a thorough discussion of the procedural risks, benefits and alternatives. All questions were addressed. Maximal Sterile Barrier Technique was utilized including caps, mask, sterile gowns, sterile gloves, sterile drape, hand hygiene and skin antiseptic. A timeout was performed prior to the initiation of the procedure. The appropriate interspace was identified under fluoroscopy, corresponding to previous cross-sectional imaging. An appropriate skin entry site was determined. After local infiltration with 1% lidocaine , an 18 gauge trocar needle was advanced into the interspace from right posterolateral extraforaminal approach. Needle tip position within the interspace confirmed on biplane images. Approximately 1 cc purulent fluid was aspirated, sent for the requested laboratory studies. The needle was removed. A sterile bandage was placed. The patient tolerated the procedure well. FLUOROSCOPY TIME:  34 mGy reference air Kerma COMPLICATIONS: None immediate. IMPRESSION: Technically successful fluoroscopic guided T11-12 disc aspiration under fluoroscopy. Ester Sides, MD [facility] Electronically Signed   By: Ester Sides M.D.   On: 12/22/2024 15:26   DG Abd 1 View Result Date: 12/22/2024 CLINICAL DATA:  Abdominal pain. EXAM: ABDOMEN - 1 VIEW COMPARISON:  Radiographs 09/01/2024.  CT 12/20/2024. FINDINGS: 0804 hours. 5 supine views of the abdomen are submitted. There is a normal nonobstructive bowel gas pattern. Tubing for laparoscopic gastric band appears intact. No supine evidence of pneumoperitoneum or suspicious abdominal calcification. Scattered vascular calcifications in the pelvis. Lumbar spine degenerative changes without acute osseous abnormality. IMPRESSION: No evidence of acute abdominal process. Laparoscopic gastric band appears intact. Electronically Signed   By: Elsie Perone M.D.   On: 12/22/2024 08:23    ECHOCARDIOGRAM COMPLETE Result Date: 12/21/2024    ECHOCARDIOGRAM REPORT   Patient Name:   Rilan Eiland. Date of Exam: 12/21/2024 Medical Rec #:  [id number]       Height:       74.0 in Accession #:    [id number]      Weight:       255.0 lb Date of Birth:  1956/11/13       BSA:          2.410 m Patient Age:    68 years        BP:           143/82 mmHg Patient Gender: M               HR:           50 bpm. Exam Location:  Inpatient Procedure: 2D Echo, Cardiac Doppler and Color Doppler (Both Spectral and Color            Flow Doppler were utilized during procedure). Indications:    I50.30* Unspecified diastolic (congestive) heart failure  History:        Patient has prior history of Echocardiogram examinations, most                 recent 11/18/2024. CHF, Signs/Symptoms:Chest Pain and                 Bacteremia; Risk Factors:Diabetes and Sleep Apnea. ESRD.  Sonographer:    Ellouise Mose RDCS Referring Phys: [id number] ANASTASSIA DOUTOVA  Sonographer Comments: Patient is obese. Image acquisition challenging due to patient body habitus. IMPRESSIONS  1. Left ventricular ejection fraction, by estimation, is 45 to 50%. The left ventricle has mildly decreased function. The left ventricle demonstrates global hypokinesis. The left ventricular internal cavity size was mildly to moderately dilated. There is mild concentric left ventricular hypertrophy. Left ventricular diastolic parameters are consistent with  Grade I diastolic dysfunction (impaired relaxation). Mild septal interdependence noted.  2. Right ventricular systolic function is mildly reduced. The right ventricular size is normal. Tricuspid regurgitation signal is inadequate for assessing PA pressure.  3. Left atrial size was mildly dilated.  4. The mitral valve is grossly normal. Mild mitral valve regurgitation.  5. The aortic valve is tricuspid. Aortic valve regurgitation is not visualized. No aortic stenosis is present.  6. The inferior vena cava is normal in size with  greater than 50% respiratory variability, suggesting right atrial pressure of 3 mmHg. FINDINGS  Left Ventricle: Left ventricular ejection fraction, by estimation, is 45 to 50%. The left ventricle has mildly decreased function. The left ventricle demonstrates global hypokinesis. The left ventricular internal cavity size was mildly to moderately dilated. There is mild concentric left ventricular hypertrophy. Left ventricular diastolic parameters are consistent with Grade I diastolic dysfunction (impaired relaxation). Right Ventricle: The right ventricular size is normal. No increase in right ventricular wall thickness. Right ventricular systolic function is mildly reduced. Tricuspid regurgitation signal is inadequate for assessing PA pressure. Left Atrium: Left atrial size was mildly dilated. Right Atrium: Right atrial size was normal in size. Pericardium: There is no evidence of pericardial effusion. Presence of epicardial fat layer. Mitral Valve: The mitral valve is grossly normal. Mild mitral valve regurgitation. Tricuspid Valve: The tricuspid valve is normal in structure. Tricuspid valve regurgitation is trivial. No evidence of tricuspid stenosis. Aortic Valve: The aortic valve is tricuspid. Aortic valve regurgitation is not visualized. No aortic stenosis is present. Pulmonic Valve: The pulmonic valve was not well visualized. Pulmonic valve regurgitation is not visualized. No evidence of pulmonic stenosis. Aorta: The aortic root and ascending aorta are structurally normal, with no evidence of dilitation. Venous: The inferior vena cava is normal in size with greater than 50% respiratory variability, suggesting right atrial pressure of 3 mmHg. IAS/Shunts: No atrial level shunt detected by color flow Doppler.  LEFT VENTRICLE PLAX 2D LVIDd:         6.40 cm      Diastology LVIDs:         4.90 cm      LV e' medial:    6.09 cm/s LV PW:         1.00 cm      LV E/e' medial:  12.3 LV IVS:        1.00 cm      LV e' lateral:    4.79 cm/s LVOT diam:     2.20 cm      LV E/e' lateral: 15.7 LV SV:         103 LV SV Index:   43 LVOT Area:     3.80 cm  LV Volumes (MOD) LV vol d, MOD A2C: 116.0 ml LV vol d, MOD A4C: 130.0 ml LV vol s, MOD A2C: 60.2 ml LV vol s, MOD A4C: 58.0 ml LV SV MOD A2C:     55.8 ml LV SV MOD A4C:     130.0 ml LV SV MOD BP:      66.3 ml RIGHT VENTRICLE             IVC RV S prime:     10.70 cm/s  IVC diam: 2.40 cm TAPSE (M-mode): 2.6 cm                             PULMONARY VEINS  Diastolic Velocity: 19.40 cm/s                             S/D Velocity:       1.60                             Systolic Velocity:  31.50 cm/s LEFT ATRIUM             Index        RIGHT ATRIUM           Index LA diam:        4.60 cm 1.91 cm/m   RA Area:     13.50 cm LA Vol (A2C):   68.1 ml 28.26 ml/m  RA Volume:   26.50 ml  11.00 ml/m LA Vol (A4C):   78.3 ml 32.49 ml/m LA Biplane Vol: 73.7 ml 30.58 ml/m  AORTIC VALVE LVOT Vmax:   99.00 cm/s LVOT Vmean:  65.000 cm/s LVOT VTI:    0.271 m  AORTA Ao Root diam: 3.00 cm Ao Asc diam:  2.60 cm MITRAL VALVE MV Area (PHT): 3.37 cm    SHUNTS MV Decel Time: 225 msec    Systemic VTI:  0.27 m MV E velocity: 75.20 cm/s  Systemic Diam: 2.20 cm MV A velocity: 85.45 cm/s MV E/A ratio:  0.88 Morene Brownie Electronically signed by Morene Brownie Signature Date/Time: 12/21/2024/1:54:48 PM    Final    CT Chest W Contrast Result Date: 12/20/2024 EXAM: CT CHEST WITH CONTRAST 12/20/2024 07:40:00 PM TECHNIQUE: CT of the chest was performed with the administration of 80 mL of iohexol  (OMNIPAQUE ) 350 MG/ML injection. Multiplanar reformatted images are provided for review. Automated exposure control, iterative reconstruction, and/or weight based adjustment of the mA/kV was utilized to reduce the radiation dose to as low as reasonably achievable. COMPARISON: Same day MRI of the thoracic spine and same day chest radiographs. CLINICAL HISTORY: Pleural effusions, concern for metastatic  disease, possible infection. FINDINGS: MEDIASTINUM: Heart and pericardium are unremarkable. Left IJ CVC tip in the right atrium. Coronary artery and aortic atherosclerotic calcification. The central airways are clear. LYMPH NODES: Right paratracheal lymph node measuring 1.7 cm on series 3 image 72 is favored reactive. Continued attention on follow up. No hilar or axillary lymphadenopathy. LUNGS AND PLEURA: Moderate bilateral pleural effusions and compressive atelectasis. Pneumonia is difficult to exclude. 2.0 cm ground-glass nodule in the left lower lobe on series 5 image 108. No pneumothorax. SOFT TISSUES/BONES: T12 compression fracture with associated soft tissue thickening and stranding compatible with discitis osteomyelitis as seen on same day MRI of the thoracic spine. The area of enhancement in the T8 vertebral body on same day MRI of the thoracic spine does not have a CT correlate. UPPER ABDOMEN: Limited images of the upper abdomen demonstrates no acute abnormality. Gastric band. IMPRESSION: 1. Moderate bilateral pleural effusions and compressive atelectasis. Pneumonia is difficult to exclude. 2. Discitis / osteomyelitis of T12. 3. 2.0 cm ground-glass nodule in the left lower lobe; recommend follow-up chest CT at 6-12 months to confirm persistence, then CT every 2 years until 5 years, as per Fleischner Society Guidelines. Electronically signed by: Norman Gatlin MD 12/20/2024 08:06 PM EST RP Workstation: [id number]   MR Lumbar Spine W Wo Contrast Result Date: 12/20/2024 EXAM: MRI LUMBAR SPINE 12/20/2024 05:31:59 PM TECHNIQUE: Multiplanar multisequence MRI of the lumbar spine was performed with and without the administration of  intravenous contrast. 10 mL (gadobutrol  (GADAVIST ) 1 MMOL/ML injection 10 mL GADOBUTROL  1 MMOL/ML IV SOLN) was administered. COMPARISON: CT abdomen and pelvis 2126. MRI lumbar spine 08/21/2024. CLINICAL HISTORY: Lumbar radiculopathy, symptoms persist with > 6 wks treatment; worsening  back pain, recent bacteremia. Abnormal CT of the abdomen and pelvis without contrast. FINDINGS: BONES AND ALIGNMENT: Normal alignment. Normal vertebral body heights except for T12. The compression fracture is again seen at T12 with 20% loss of height. No retropulsed bone is present. The posterior aspect of the T12 vertebral body enhances, including portions that appear lytic on the CT scan. The anterior left inferior aspect of the anterior body also enhances. The more lytic portions anteriorly on CT do not enhance. A fragment is noted anteriorly. Enhancing soft tissue surrounds the fracture. Fluid is present in the T11-T12 and T12-L1 disc spaces. Subtle enhancement is present within the T11-T12 disc space. Marrow signal change and enhancement are present within the facet joints bilaterally at L4-L5 and L5-S1. Chronic fatty marrow changes are present on the left L5-S1 without definite enhancement. SPINAL CORD: The conus medullaris terminates at T12-L1. SOFT TISSUES: No paraspinal mass. Enhancing soft tissue surrounds the T12 fracture. T12-L1: Facet hypertrophy is present bilaterally. No significant stenosis is present. L1-L2: Facet hypertrophy is present bilaterally. No disc herniation. No spinal canal stenosis or neural foraminal narrowing. L2-L3: Facet hypertrophy is present bilaterally. No disc herniation. No spinal canal stenosis or neural foraminal narrowing. L3-L4: Broad-based disc protrusion and moderate facet hypertrophy result in moderate central and right foraminal stenosis. L4-L5: A broad-based disc protrusion is asymmetric to the left. Moderate facet hypertrophy is present bilaterally. Moderate to severe central and left foraminal narrowing is present. Mild right foraminal narrowing is present. Postcontrast images demonstrate enhancement in the facets and fluid in the facet joints bilaterally. L5-S1: A leftward disc protrusion and asymmetric left-sided facet hypertrophy are present, resulting in moderate  left subarticular and foraminal stenosis. Marrow signal change and enhancement are present within the facet joints bilaterally. Chronic fatty marrow changes are present in the left facet joint without definite enhancement. IMPRESSION: 1. T12 compression fracture with associated findings concerning for superimposed discitis-osteomyelitis at T11-12 and possibly at L1-2 . 2. Findings concerning for septic facet arthropathy at L4-L5, and possibly L5-S1. 3. Moderate to severe central canal stenosis and left foraminal narrowing at L4-L5. Electronically signed by: Lonni Necessary MD 12/20/2024 07:33 PM EST RP Workstation: [id number]   MR THORACIC SPINE W WO CONTRAST Result Date: 12/20/2024 EXAM: MRI THORACIC SPINE WITHOUT AND WITH INTRAVENOUS CONTRAST 12/20/2024 05:33:01 PM TECHNIQUE: Multiplanar multisequence MRI of the thoracic spine was performed without and with the administration of intravenous contrast. 10 mL (gadobutrol  (GADAVIST ) 1 MMOL/ML injection 10 mL GADOBUTROL  1 MMOL/ML IV SOLN). COMPARISON: Paracentesis of the abdomen and pelvis 02/28/2022. CLINICAL HISTORY: Mid-back pain, prior compression fracture; T12 lytic lesions/compression fracture, including thoracic imaging. FINDINGS: BONES AND ALIGNMENT: The T12 compression fracture is again noted with 20% loss of height. Focal posterior enhancement is present at T8 vertebral body without fracture. Subtle marrow signal and enhancement is present in the left posterior element of T4. Normal alignment. Normal vertebral body heights (except T12). SPINAL CORD: Normal spinal cord volume. Normal spinal cord signal. SOFT TISSUES: Dense soft tissue enhancement is present. Extensive paraspinous soft tissue enhancement is present at the fracture site. DEGENERATIVE CHANGES: Fluid and enhancement are present in the T11-T12 disc space. Enhancement is present in the lower third of the T11 vertebral body. No disc herniation. No spinal canal stenosis.  No neural foraminal  narrowing. LUNGS: Large bilateral pleural effusions are present. Bilateral airspace disease is present. Recommend CT chest with contrast. IMPRESSION: 1. T12 compression fracture with associated findings concerning for discitis/osteomyelitis centered at T11-T12, with extensive paraspinous soft tissue inflammatory change at this level. 2. Focal posterior enhancement in the T8 vertebral body without fracture is concerning for an additional site of inflammation. Metastatic disease is considered less likely. 3. Subtle marrow signal abnormality and enhancement in the left posterior elements of T4, indeterminate. 4. Large bilateral pleural effusions and bilateral airspace disease; recommend CT chest with contrast. Electronically signed by: Lonni Necessary MD 12/20/2024 06:32 PM EST RP Workstation: [id number]   CT ABDOMEN PELVIS WO CONTRAST Result Date: 12/20/2024 CLINICAL DATA:  Abdominal pain, acute, nonlocalized. EXAM: CT ABDOMEN AND PELVIS WITHOUT CONTRAST TECHNIQUE: Multidetector CT imaging of the abdomen and pelvis was performed following the standard protocol without IV contrast. RADIATION DOSE REDUCTION: This exam was performed according to the departmental dose-optimization program which includes automated exposure control, adjustment of the mA and/or kV according to patient size and/or use of iterative reconstruction technique. COMPARISON:  None Available. FINDINGS: Lower chest: There are bilateral small-to-moderate pleural effusions with associated compressive atelectatic changes. Correlate clinically for superimposed pneumonia. Normal cardiac size. No pericardial effusion. There is apparent hypoattenuation of the blood pool relative to the myocardium, suggestive of anemia. There are coronary artery atherosclerotic calcifications, in keeping with coronary artery disease. Hepatobiliary: The liver is normal in size. Non-cirrhotic configuration. No suspicious mass. No intrahepatic or extrahepatic bile duct  dilation. No calcified gallstones. Normal gallbladder wall thickness. No pericholecystic inflammatory changes. Pancreas: Unremarkable. No pancreatic ductal dilatation or surrounding inflammatory changes. Spleen: Within normal limits. No focal lesion. Adrenals/Urinary Tract: Adrenal glands are unremarkable. No suspicious renal mass within the limitations of this unenhanced exam. There is a partially exophytic cyst arising from right kidney lower pole, medially measuring up to 1.3 x 1.5 cm. There are at least 2, adjacent, 1-2 mm sized left lower ureteric calculi causing mild-to-moderate proximal obstructive uropathy. No other nephroureterolithiasis on either side. Urinary bladder is under distended, precluding optimal assessment. However, no large mass or stones identified. No perivesical fat stranding. Stomach/Bowel: There is a tiny sliding hiatal hernia. Gastric lap band noted. No disproportionate dilation of the small or large bowel loops. No evidence of abnormal bowel wall thickening or inflammatory changes. The appendix was not visualized; however there is no acute inflammatory process in the right lower quadrant. Vascular/Lymphatic: No ascites or pneumoperitoneum. No abdominal or pelvic lymphadenopathy, by size criteria. No aneurysmal dilation of the major abdominal arteries. There are mild peripheral atherosclerotic vascular calcifications of the aorta and its major branches. Reproductive: Patient is status post surgical removal of prostate and bilateral seminal vesicles. Other: There is a small fat containing periumbilical hernia. There is also a small fat containing left inguinal hernia. There is mild to moderate anasarca. Mild-to-moderate diffuse atrophy/fatty replacement of paravertebral and pelvic vasculature noted. The soft tissues and abdominal wall are otherwise unremarkable. Musculoskeletal: There is near complete destruction of T12 vertebral body secondary to infiltrating lytic areas along with  perivertebral soft tissue. There are fractures of superior as well as inferior endplates and anterior cortex. In the absence of prior imaging studies and history, differential diagnosis includes pathological fracture, spondylo discitis, etc. Correlate clinically and with prior imaging. Patient is scheduled for contrast-enhanced MRI lumbar spine. There are mild to moderate degenerative changes of the visualized thoracolumbar spine. IMPRESSION: 1. There are at least 2, adjacent, 1-2  mm sized left lower ureteric calculi causing mild-to-moderate proximal obstructive uropathy. No other nephroureterolithiasis on either side. 2. There is near complete destruction of T12 vertebral body secondary to infiltrating lytic areas along with perivertebral soft tissue. There are fractures of superior as well as inferior endplates and anterior cortex. In the absence of prior imaging studies and history, differential diagnosis includes pathological fracture, spondylo-discitis, etc. Patient is scheduled for contrast-enhanced MRI lumbar spine. 3. There are bilateral small-to-moderate pleural effusions with associated compressive atelectatic changes. Correlate clinically for superimposed pneumonia. Aortic Atherosclerosis (ICD10-I70.0). Electronically Signed   By: Ree Molt M.D.   On: 12/20/2024 15:11   DG Chest Port 1 View Result Date: 12/20/2024 CLINICAL DATA:  Shortness of breath. EXAM: PORTABLE CHEST 1 VIEW COMPARISON:  11/11/2024. FINDINGS: The heart is enlarged and the mediastinal contour is stable. The pulmonary vasculature is distended. A left internal jugular central venous catheter terminates over the right atrium. Perihilar airspace opacities are noted bilaterally, greater on the right than on the left. There is a small right pleural effusion with a small amount of pleural fluid in the minor fissure on the right. No pneumothorax is seen. No acute osseous abnormality. IMPRESSION: 1. Cardiomegaly with pulmonary vascular  congestion. 2. Perihilar and basilar airspace opacities bilaterally, likely edema, less likely infiltrate. 3. Small right pleural effusion. Electronically Signed   By: Leita Birmingham M.D.   On: 12/20/2024 13:32   I personally spent a total of 81 minutes in the care of the patient today including preparing to see the patient, getting/reviewing separately obtained history, performing a medically appropriate exam/evaluation, counseling and educating, placing orders, referring and communicating with other health care professionals, documenting clinical information in the EHR, independently interpreting results, communicating results, and coordinating care.   Annalee Orem, MD Infectious Disease Physician [facility] Pager: [phone]      [1]  Allergies Allergen Reactions   Ace Inhibitors Cough   Maxidex  [Dexamethasone ] Other (See Comments)    Sexual dysfunction   Verapamil Other (See Comments)    constipation   "

## 2024-12-24 NOTE — Progress Notes (Signed)
 Occupational Therapy Treatment Patient Details Name: Brendan [name]. MRN: [id number] DOB: [birth year] Today's Date: 12/24/2024   History of present illness Pt is 69 year old presented to [facility] on  12/20/24 for back pain.  Pt with compression fracture of T12 with likely discitis and osteomyelitis at T11-T12 there is also an inflammatory lesion at the level of T8. Neurosurgery consulted and recommended IR consult for possible needle aspiration to T11-12 for culture. No surgical intervention recommended. Pt with recent hospitalization and inpatient rehab stay (11/03/24-12/05/24) after sepsis. Early Dec was also admitted with fall and trace SAH. PMH - ESRD on HD, necrotizing fasciitis, DM2, HTN, hypothyroidism, OSA, obesity, CKD, CHF, glaucoma, gynecomastia, HLD, vertigo, obesity, peripheral neuropathy, prostate CA, sickle cell trait   OT comments  Pt progressing towards goals. OT entered in AM with pt reporting pain 2/10, however politely requesting OT to return at 2pm. OT coordinated with RN to premedicate pt, however pt requested pain meds prior to planned time, therefore pain 6/10 upon entry. Pt expressing desire to participate with therapy and transfer to chair. After completing bed mobility with supervision, pt reporting 8/10 radiating pain. Educated pt on pursed lip breathing technique in attempts of pain control. Pt unable to stand with max assist x3 trials, reporting unable to lift my legs. Pt continues to be limited by pain, decreased strength, and balance. Updated d/c recs to >3 hours of skilled rehab daily to optimize independence levels. Will continue to follow acutely.      If plan is discharge home, recommend the following:  Assist for transportation;Help with stairs or ramp for entrance;Two people to help with walking and/or transfers;Two people to help with bathing/dressing/bathroom   Equipment Recommendations  None recommended by OT    Recommendations for Other Services Rehab consult     Precautions / Restrictions Precautions Precautions: Fall;Back Precaution Booklet Issued: No Recall of Precautions/Restrictions: Intact Precaution/Restrictions Comments: L posterior canal BPPV; back precautions for comfort Restrictions Weight Bearing Restrictions Per Provider Order: No       Mobility Bed Mobility Overal bed mobility: Needs Assistance Bed Mobility: Rolling, Sidelying to Sit, Sit to Sidelying Rolling: Supervision Sidelying to sit: Supervision     Sit to sidelying: Min assist General bed mobility comments: Increased time and effort to complete rolling without assist. Min assist to return BLEs to bed    Transfers Overall transfer level: Needs assistance Equipment used: Rolling walker (2 wheels) Transfers: Sit to/from Stand Sit to Stand: Max assist           General transfer comment: Unable to complete STS despite max assist and x3 trials     Balance Overall balance assessment: Needs assistance Sitting-balance support: No upper extremity supported, Feet supported, Bilateral upper extremity supported Sitting balance-Leahy Scale: Fair     Standing balance support: Bilateral upper extremity supported Standing balance-Leahy Scale: Poor       ADL either performed or assessed with clinical judgement   ADL Overall ADL's : Needs assistance/impaired Eating/Feeding: Modified independent;Sitting Eating/Feeding Details (indicate cue type and reason): EOB       General ADL Comments: Pt limited by pain, unable to progress OOB activities on this date d/t pain    Extremity/Trunk Assessment Upper Extremity Assessment Upper Extremity Assessment: Right hand dominant;Generalized weakness   Lower Extremity Assessment Lower Extremity Assessment: Defer to PT evaluation                 Communication Communication Communication: No apparent difficulties   Cognition Arousal: Alert Behavior During Therapy:  WFL for tasks assessed/performed Cognition: No  apparent impairments       Following commands: Intact        Cueing   Cueing Techniques: Verbal cues, Tactile cues        General Comments VSS on RA    Pertinent Vitals/ Pain       Pain Assessment Pain Assessment: 0-10 Pain Score: 8  Pain Location: back Pain Descriptors / Indicators: Grimacing, Guarding, Aching, Moaning Pain Intervention(s): Premedicated before session, Limited activity within patient's tolerance, Patient requesting pain meds-RN notified   Frequency  Min 2X/week        Progress Toward Goals  OT Goals(current goals can now be found in the care plan section)  Progress towards OT goals: Goals updated  Acute Rehab OT Goals Patient Stated Goal: To get pain managed OT Goal Formulation: With patient Time For Goal Achievement: 01/05/25 Potential to Achieve Goals: Good ADL Goals Pt Will Perform Lower Body Bathing: with min assist;sit to/from stand;with adaptive equipment Pt Will Perform Lower Body Dressing: with min assist;with adaptive equipment Pt Will Transfer to Toilet: ambulating;with supervision;bedside commode Pt Will Perform Toileting - Clothing Manipulation and hygiene: with set-up;with supervision;sitting/lateral leans;sit to/from stand  Plan         AM-PAC OT 6 Clicks Daily Activity     Outcome Measure   Help from another person eating meals?: None Help from another person taking care of personal grooming?: A Little Help from another person toileting, which includes using toliet, bedpan, or urinal?: A Lot Help from another person bathing (including washing, rinsing, drying)?: A Lot Help from another person to put on and taking off regular upper body clothing?: A Lot Help from another person to put on and taking off regular lower body clothing?: A Lot 6 Click Score: 15    End of Session Equipment Utilized During Treatment: Gait belt;Rolling walker (2 wheels)  OT Visit Diagnosis: Unsteadiness on feet (R26.81);Other abnormalities of gait  and mobility (R26.89);Muscle weakness (generalized) (M62.81);Pain   Activity Tolerance Patient limited by pain   Patient Left in bed;with call bell/phone within reach;with bed alarm set;with family/visitor present   Nurse Communication Mobility status        Time: 8645-8562 OT Time Calculation (min): 43 min  Charges: OT General Charges $OT Visit: 1 Visit OT Treatments $Self Care/Home Management : 38-52 mins  Adrianne BROCKS, OT  Acute Rehabilitation Services Office [phone] Secure chat preferred   Adrianne GORMAN Savers 12/24/2024, 4:20 PM

## 2024-12-25 ENCOUNTER — Ambulatory Visit: Admitting: Physical Therapy

## 2024-12-25 ENCOUNTER — Ambulatory Visit: Admitting: Occupational Therapy

## 2024-12-25 LAB — CBC
HCT: 25.2 % — ABNORMAL LOW (ref 39.0–52.0)
Hemoglobin: 8 g/dL — ABNORMAL LOW (ref 13.0–17.0)
MCH: 26.3 pg (ref 26.0–34.0)
MCHC: 31.7 g/dL (ref 30.0–36.0)
MCV: 82.9 fL (ref 80.0–100.0)
Platelets: 416 10*3/uL — ABNORMAL HIGH (ref 150–400)
RBC: 3.04 MIL/uL — ABNORMAL LOW (ref 4.22–5.81)
RDW: 17.3 % — ABNORMAL HIGH (ref 11.5–15.5)
WBC: 14.8 10*3/uL — ABNORMAL HIGH (ref 4.0–10.5)
nRBC: 0 % (ref 0.0–0.2)

## 2024-12-25 LAB — AEROBIC/ANAEROBIC CULTURE W GRAM STAIN (SURGICAL/DEEP WOUND): Culture: NO GROWTH

## 2024-12-25 LAB — RENAL FUNCTION PANEL
Albumin: 2.7 g/dL — ABNORMAL LOW (ref 3.5–5.0)
Anion gap: 10 (ref 5–15)
BUN: 29 mg/dL — ABNORMAL HIGH (ref 8–23)
CO2: 28 mmol/L (ref 22–32)
Calcium: 8.6 mg/dL — ABNORMAL LOW (ref 8.9–10.3)
Chloride: 94 mmol/L — ABNORMAL LOW (ref 98–111)
Creatinine, Ser: 5.27 mg/dL — ABNORMAL HIGH (ref 0.61–1.24)
GFR, Estimated: 11 mL/min — ABNORMAL LOW
Glucose, Bld: 236 mg/dL — ABNORMAL HIGH (ref 70–99)
Phosphorus: 5.4 mg/dL — ABNORMAL HIGH (ref 2.5–4.6)
Potassium: 4.3 mmol/L (ref 3.5–5.1)
Sodium: 131 mmol/L — ABNORMAL LOW (ref 135–145)

## 2024-12-25 LAB — CULTURE, BLOOD (ROUTINE X 2)
Culture: NO GROWTH
Culture: NO GROWTH
Special Requests: ADEQUATE

## 2024-12-25 LAB — GLUCOSE, CAPILLARY
Glucose-Capillary: 314 mg/dL — ABNORMAL HIGH (ref 70–99)
Glucose-Capillary: 130 mg/dL — ABNORMAL HIGH (ref 70–99)
Glucose-Capillary: 134 mg/dL — ABNORMAL HIGH (ref 70–99)
Glucose-Capillary: 96 mg/dL (ref 70–99)
Glucose-Capillary: 217 mg/dL — ABNORMAL HIGH (ref 70–99)

## 2024-12-25 MED ORDER — PENTAFLUOROPROP-TETRAFLUOROETH EX AERO: 1.0000 | INHALATION_SPRAY | CUTANEOUS | Status: DC | PRN

## 2024-12-25 MED ORDER — ALTEPLASE 2 MG IJ SOLR: 2.0000 mg | Freq: Once | INTRAMUSCULAR | Status: DC | PRN

## 2024-12-25 MED ORDER — LIDOCAINE HCL (PF) 1 % IJ SOLN: 5.0000 mL | INTRAMUSCULAR | Status: DC | PRN

## 2024-12-25 MED ORDER — HEPARIN SODIUM (PORCINE) 1000 UNIT/ML DIALYSIS: 1000.0000 [IU] | INTRAMUSCULAR | Status: DC | PRN

## 2024-12-25 MED ORDER — INSULIN ASPART 100 UNIT/ML IJ SOLN
3.0000 [IU] | Freq: Three times a day (TID) | INTRAMUSCULAR | Status: AC
  Administered 2024-12-25: 3 [IU] via SUBCUTANEOUS
  Filled 2024-12-25: qty 3

## 2024-12-25 MED ORDER — HEPARIN SODIUM (PORCINE) 1000 UNIT/ML DIALYSIS
2500.0000 [IU] | Freq: Once | INTRAMUSCULAR | Status: AC
  Administered 2024-12-25: 2500 [IU] via INTRAVENOUS_CENTRAL

## 2024-12-25 MED ORDER — INSULIN ASPART 100 UNIT/ML IJ SOLN
0.0000 [IU] | Freq: Three times a day (TID) | INTRAMUSCULAR | Status: AC
  Administered 2024-12-25: 1 [IU] via SUBCUTANEOUS
  Filled 2024-12-25: qty 1

## 2024-12-25 MED ORDER — INSULIN ASPART 100 UNIT/ML IJ SOLN
0.0000 [IU] | Freq: Every day | INTRAMUSCULAR | Status: AC
  Administered 2024-12-25: 2 [IU] via SUBCUTANEOUS
  Filled 2024-12-25: qty 2

## 2024-12-25 MED ORDER — LIDOCAINE-PRILOCAINE 2.5-2.5 % EX CREA: 1.0000 | TOPICAL_CREAM | CUTANEOUS | Status: DC | PRN

## 2024-12-25 MED ORDER — HEPARIN SODIUM (PORCINE) 1000 UNIT/ML IJ SOLN
INTRAMUSCULAR | Status: AC
  Filled 2024-12-25: qty 10

## 2024-12-25 MED ORDER — SODIUM CHLORIDE 0.9 % IV SOLN: INTRAVENOUS | Status: AC | PRN

## 2024-12-25 MED ORDER — ANTICOAGULANT SODIUM CITRATE 4% (200MG/5ML) IV SOLN: 5.0000 mL | Status: DC | PRN

## 2024-12-25 NOTE — Inpatient Diabetes Management (Signed)
 Inpatient Diabetes Program Recommendations  AACE/ADA: New Consensus Statement on Inpatient Glycemic Control   Target Ranges:  Prepandial:   less than 140 mg/dL      Peak postprandial:   less than 180 mg/dL (1-2 hours)      Critically ill patients:  140 - 180 mg/dL   Lab Results  Component Value Date   GLUCAP 314 (H) 12/25/2024   HGBA1C 8.3 (H) 12/20/2024    Latest Reference Range & Units 12/24/24 08:15 12/24/24 12:12 12/24/24 16:13 12/24/24 21:16 12/25/24 06:42  Glucose-Capillary 70 - 99 mg/dL 883 (H) 820 (H) 723 (H) 260 (H) 314 (H)    Review of Glycemic Control  Diabetes history: DM2  Outpatient Diabetes medications:  FSL 3 Semglee  30 units daily Humalog  2 units tid with meals Mounjaro  5 mg weekly  Current orders for Inpatient glycemic control:  Novolog  0-9 units TID Novolog  2 units TID with meals  Semglee  15 units q HS  Inpatient Diabetes Program Recommendations:     Please consider: - Increase meal coverage to Novolog  3 units TID - Add HS coverage, Novolog  0-5 units at bedtime   Thanks,  Lavanda Search, RN, MSN, [facility]  Inpatient Diabetes Coordinator  Pager [phone] (8a-5p)

## 2024-12-25 NOTE — Progress Notes (Signed)
" °   12/25/24 1154  Vitals  Temp 97.6 F (36.4 C)  Pulse Rate (!) 52  Resp (!) 8  BP (!) 152/70  SpO2 100 %  O2 Device Room Air  Type of Weight Post-Dialysis  Oxygen Therapy  Patient Activity (if Appropriate) In bed  Pulse Oximetry Type Continuous  Oximetry Probe Site Changed No  Post Treatment  Dialyzer Clearance Lightly streaked  Hemodialysis Intake (mL) 0 mL  Liters Processed 78  Fluid Removed (mL) 2500 mL  Tolerated HD Treatment Yes   Received patient in bed to unit.  Alert and oriented.  Informed consent signed and in chart.   TX duration:3.5  Patient tolerated well.  Transported back to the room  Alert, without acute distress.  Hand-off given to patient's nurse.   Access used: [email or web address] Access issues: no complications  Total UF removed: 2500 Medication(s) given: none   Delon LITTIE [facility] "

## 2024-12-25 NOTE — Progress Notes (Signed)
 [facility] Progress Note  Subjective:  Seen in HD unit  Presentation summary: pending   Vitals:   12/25/24 0900 12/25/24 0930 12/25/24 1000 12/25/24 1030  BP: (!) 152/74 (!) 141/69 132/71 (!) 143/72  Pulse: (!) 53 (!) 53 (!) 53 (!) 52  Resp: (!) 8 18 (!) 8 13  Temp:      TempSrc:      SpO2: 98% 99% 100% 100%  Weight:      Height:        Exam: General: Lying flat in bed, on nasal oxygen, nad  Head: NCAT sclera not icteric MMM CV: Regular rate, no murmur, no rub  Pulm: normal respiratory effort, lungs clear  Abdomen: non-tender, no masses  Lower extremities: trace edema  Neuro: A & O X 3. Moves all extremities spontaneously. Psych:  Responds to questions appropriately with a normal affect. Dialysis Access: L chest TDC     OP HD: [facility] MWF 4h  122.9kg  B400  2K bath  TDC  Heparin  2500  -Mircera 150 q 2 wks (last 1/21) -Venofer  100 mg IV x 5  -Calcitriol  0.25 q HD   Assessment/ Plan: T11- T12 discitis/ osteomyelitis- S/P IR procedure 2/03. Suspected vertebral osteomyelitis per ID. Needs 6 wks IV vanc/ cefepime  w/ HD.  Volume/ SOB-  pulmonary edema on admit, resolved w/ HD, under dry wt. Cont to lower vol as tolerated.   ESRD-  HD MWF. Had HD here Sunday/ Wed. Next HD today.  Hypertension- getting his home meds. BP's stable today Anemia- Hgb 8- 10 here. Follow.    Myer Fret MD  [facility] 12/25/2024, 11:39 AM  Recent Labs  Lab 12/23/24 0113 12/24/24 0223 12/25/24 0132  HGB 8.1* 8.0* 8.0*  ALBUMIN  2.7*  --  2.7*  CALCIUM 8.6* 8.6* 8.6*  PHOS 6.2*  --  5.4*  CREATININE 5.37* 4.19* 5.27*  K 4.3 4.2 4.3   Recent Labs  Lab 12/20/24 2042  IRON  32*  TIBC 165*  FERRITIN 798*   Inpatient medications:  amLODipine   5 mg Oral Daily   calcitRIOL   0.25 mcg Oral Q M,W,F-HD   carvedilol   6.25 mg Oral BID WC   Chlorhexidine  Gluconate Cloth  6 each Topical Q0600   darbepoetin (ARANESP ) injection - DIALYSIS  150 mcg Subcutaneous Q Wed-1800   feeding  supplement (NEPRO CARB STEADY)  237 mL Oral BID BM   folic acid   1 mg Oral Daily   insulin  aspart  0-5 Units Subcutaneous QHS   insulin  aspart  0-9 Units Subcutaneous TID WC   insulin  aspart  3 Units Subcutaneous TID WC   insulin  glargine-yfgn  15 Units Subcutaneous QHS   levothyroxine   125 mcg Oral q AM   multivitamin  1 tablet Oral QHS   scopolamine   1 patch Transdermal Q72H   simvastatin   20 mg Oral QPM   sodium chloride  flush  3 mL Intravenous Q12H    anticoagulant sodium citrate      ceFEPime  (MAXIPIME ) IV 1 g (12/24/24 2300)   vancomycin  1,000 mg (12/23/24 2154)   acetaminophen  **OR** acetaminophen , alteplase , anticoagulant sodium citrate , heparin , [START ON 12/26/2024] heparin , HYDROmorphone  (DILAUDID ) injection, lidocaine  (PF), lidocaine -prilocaine , ondansetron  **OR** ondansetron  (ZOFRAN ) IV, oxyCODONE , pentafluoroprop-tetrafluoroeth, sodium chloride  flush, traMADol 

## 2024-12-25 NOTE — Progress Notes (Signed)
 " PROGRESS NOTE    Brendan [name].  FMW:[id number] DOB: 06-Aug-1956 DOA: 12/20/2024 PCP: Rexanne Ingle, MD    Chief Complaint  Patient presents with   Back Pain    Brief Narrative:  Yechezkel Fertig. is an 69 y.o. male with past medical history of end-stage renal disease on hemodialysis, diabetes mellitus type 2, traumatic brain injury, history of HFpEF, Fournier's gangrene, History of MSSA bacteremia thought to be from his HD catheter line comes in with a acute on chronic back pain which has gotten worse over the last several days denies any fever chills, MRI of the thoracic and lumbar spine showed T12 compression fracture with discitis and osteomyelitis of T11 and T12 and possibly L1 and L2 with septic facet arthropathy of L4-L5 and possibly L5-S1 neurosurgery was consulted and recommended IR consultation.  Tissue sample was obtained.  Patient has been placed on IV antibiotic therapy  02/05 consulted ID who recommended 6 weeks of IV Vanco/cefepime  which HD.   Assessment & Plan:   Principal Problem:   Vertebral osteomyelitis (HCC) Active Problems:   ESRD on dialysis (HCC)   Hypertension associated with diabetes (HCC)   Insulin -requiring or dependent type II diabetes mellitus (HCC)   History of traumatic brain injury   Acute on chronic diastolic CHF (congestive heart failure) (HCC)   Hypothyroidism   Obesity, class 1   Malnutrition of moderate degree   Acute left-sided low back pain without sciatica   Acute pulmonary edema (HCC)   Closed fracture of twelfth thoracic vertebra (HCC)   Lesion of lumbar spine   ESRD (end stage renal disease) (HCC)  Vertebral osteomyelitis (HCC) Patient noted to have presented with complaints of back pain . - Imaging done concerning for osteomyelitis.   - Sed rate of 75, CRP 15.3.   - Patient seen in consult by neurosurgery who recommended IR for needle aspiration of T11-T12 vertebrae to obtain culture which was done on 12/22/2024 . - Cultures with no  growth to date.   - ID consulted and recommended 6 weeks of IV vancomycin /cefepime  with HD.  -PT/OT. - Will need outpatient follow-up with ID and neurosurgery.   ESRD on dialysis [facility]) MWF Hyponatremia and hypokalemia  -Patient being followed by nephrology and on HD.  Metabolic bone disease -Continue calcitriol .  Volume overload/shortness of breath/pulmonary edema -Patient noted on admission to be volume overloaded with pulmonary edema on imaging. -Resolved on hemodialysis -Volume managed on HD.   Anemia of chronic renal disease -Hemoglobin stable at 8.0   T2DM,  Hemoglobin A1c 8.3  - CBG at 130 this morning.  - Continue Semglee  15 units daily.  - Increase meal coverage NovoLog  to 3 units 3 times daily with meals.  -Add bedtime coverage.  - SSI.    Hypothyroidism -Continue Synthroid   Hypertension -Continue Norvasc , Coreg .   DVT prophylaxis: SCDs Code Status: Full Family Communication: Updated patient.  No family at bedside. Disposition: TBD  Status is: Inpatient Remains inpatient appropriate because: Severity of   Consultants:  Nephrology: Dr. Jerrye 12/20/2024 Neurosurgery: Dr. Colon 12/20/2024 Interventional radiology: Dr. Jennefer 12/22/2024 ID: Dr. Dea 12/24/2024  Procedures:  T11-T12 disc aspiration by IR: Dr. Jennefer 12/22/2024 CT chest 12/20/2024 CT abdomen and pelvis 12/20/2024 Chest x-ray 12/20/2024 Abdominal films 12/22/2024 MRI T-spine/L-spine 12/20/2024 2D echo 12/21/2024  Antimicrobials:  Anti-infectives (From admission, onward)    Start     Dose/Rate Route Frequency Ordered Stop   12/21/24 2200  ceFEPIme  (MAXIPIME ) 1 g in sodium chloride  0.9 % 100 mL  IVPB       Placed in Followed by Linked Group   1 g 200 mL/hr over 30 Minutes Intravenous Every 24 hours 12/20/24 1920     12/21/24 2200  vancomycin  (VANCOCIN ) IVPB 1000 mg/200 mL premix        1,000 mg 200 mL/hr over 60 Minutes Intravenous Every M-W-F 12/21/24 1220     12/21/24 1230  vancomycin   (VANCOCIN ) IVPB 1000 mg/200 mL premix        1,000 mg 200 mL/hr over 60 Minutes Intravenous  Once 12/21/24 1219 12/21/24 1330   12/20/24 1930  ceFEPIme  (MAXIPIME ) 2 g in sodium chloride  0.9 % 100 mL IVPB       Placed in Followed by Linked Group   2 g 200 mL/hr over 30 Minutes Intravenous  Once 12/20/24 1920 12/20/24 2024   12/20/24 1930  vancomycin  (VANCOREADY) IVPB 2000 mg/400 mL        2,000 mg 200 mL/hr over 120 Minutes Intravenous  Once 12/20/24 1920 12/20/24 2242         Subjective: Patient laying in bed.  Patient with complaints of lower extremity weakness and significant back pain.  States pain medication helps with back pain when he receives it.  Just returned from hemodialysis.  Denies any chest pain or shortness of breath.  Patient states neurosurgery states he he will be unable to work with physical therapy currently.  Objective: Vitals:   12/25/24 1643 12/25/24 1645 12/25/24 1730 12/25/24 1919  BP:  (!) 180/77 (!) 170/70 136/61  Pulse:  (!) 57  61  Resp:    18  Temp:      TempSrc:      SpO2: 94% 94%  95%  Weight:      Height:        Intake/Output Summary (Last 24 hours) at 12/25/2024 2330 Last data filed at 12/25/2024 1850 Gross per 24 hour  Intake --  Output 2740 ml  Net -2740 ml   Filed Weights   12/23/24 0503 12/23/24 0945 12/25/24 0822  Weight: 123 kg (S) 120 kg 120.3 kg    Examination:  General exam: Appears calm and comfortable  Respiratory system: Clear to auscultation anterior lung fields.  No wheezes, no crackles, no rhonchi.  Fair air movement.  Speaking in full sentences.SABRA Respiratory effort normal. Cardiovascular system: S1 & S2 heard, RRR. No JVD, murmurs, rubs, gallops or clicks. No pedal edema. Gastrointestinal system: Abdomen is nondistended, soft and nontender. No organomegaly or masses felt. Normal bowel sounds heard. Central nervous system: Alert and oriented. No focal neurological deficits. Extremities: Symmetric 5 x 5 power. Skin: No  rashes, lesions or ulcers Psychiatry: Judgement and insight appear normal. Mood & affect appropriate.     Data Reviewed: I have personally reviewed following labs and imaging studies  CBC: Recent Labs  Lab 12/20/24 1345 12/20/24 1411 12/21/24 0300 12/23/24 0113 12/24/24 0223 12/25/24 0132  WBC 12.4*  --  12.2* 12.8* 15.1* 14.8*  NEUTROABS 10.0*  --   --  10.0*  --   --   HGB 9.0* 9.5* 8.5* 8.1* 8.0* 8.0*  HCT 27.8* 28.0* 26.4* 25.0* 24.3* 25.2*  MCV 82.7  --  83.8 83.6 82.1 82.9  PLT 410*  --  409* 413* 397 416*    Basic Metabolic Panel: Recent Labs  Lab 12/20/24 1512 12/21/24 0300 12/23/24 0113 12/24/24 0223 12/25/24 0132  NA 131* 131* 131* 133* 131*  K 3.7 3.4* 4.3 4.2 4.3  CL 90* 91* 93* 94* 94*  CO2 28 27 28 30 28   GLUCOSE 270* 161* 301* 231* 236*  BUN 26* 16 28* 22 29*  CREATININE 5.12* 3.58* 5.37* 4.19* 5.27*  CALCIUM 8.6* 8.3* 8.6* 8.6* 8.6*  MG  --  2.0  --   --   --   PHOS  --  4.2 6.2*  --  5.4*    GFR: Estimated Creatinine Clearance: 18.2 mL/min (A) (by C-G formula based on SCr of 5.27 mg/dL (H)).  Liver Function Tests: Recent Labs  Lab 12/20/24 1512 12/21/24 0300 12/23/24 0113 12/25/24 0132  AST 11* 10*  --   --   ALT <5 <5  --   --   ALKPHOS 104 104  --   --   BILITOT 0.5 0.6  --   --   PROT 7.6 7.8  --   --   ALBUMIN  2.8* 2.9* 2.7* 2.7*    CBG: Recent Labs  Lab 12/25/24 0642 12/25/24 1055 12/25/24 1311 12/25/24 1653 12/25/24 2115  GLUCAP 314* 130* 134* 96 217*     Recent Results (from the past 240 hours)  Resp panel by RT-PCR (RSV, Flu A&B, Covid) Anterior Nasal Swab     Status: None   Collection Time: 12/20/24  1:45 PM   Specimen: Anterior Nasal Swab  Result Value Ref Range Status   SARS Coronavirus 2 by RT PCR NEGATIVE NEGATIVE Final   Influenza A by PCR NEGATIVE NEGATIVE Final   Influenza B by PCR NEGATIVE NEGATIVE Final    Comment: (NOTE) The Xpert Xpress SARS-CoV-2/FLU/RSV plus assay is intended as an aid in the  diagnosis of influenza from Nasopharyngeal swab specimens and should not be used as a sole basis for treatment. Nasal washings and aspirates are unacceptable for Xpert Xpress SARS-CoV-2/FLU/RSV testing.  Fact Sheet for Patients: bloggercourse.com  Fact Sheet for Healthcare Providers: seriousbroker.it  This test is not yet approved or cleared by the [place]  FDA and has been authorized for detection and/or diagnosis of SARS-CoV-2 by FDA under an Emergency Use Authorization (EUA). This EUA will remain in effect (meaning this test can be used) for the duration of the COVID-19 declaration under Section 564(b)(1) of the Act, 21 U.S.C. section 360bbb-3(b)(1), unless the authorization is terminated or revoked.     Resp Syncytial Virus by PCR NEGATIVE NEGATIVE Final    Comment: (NOTE) Fact Sheet for Patients: bloggercourse.com  Fact Sheet for Healthcare Providers: seriousbroker.it  This test is not yet approved or cleared by the [place]  FDA and has been authorized for detection and/or diagnosis of SARS-CoV-2 by FDA under an Emergency Use Authorization (EUA). This EUA will remain in effect (meaning this test can be used) for the duration of the COVID-19 declaration under Section 564(b)(1) of the Act, 21 U.S.C. section 360bbb-3(b)(1), unless the authorization is terminated or revoked.  Performed at [facility], [address] [place], [place] [address]   Blood culture (routine x 2)     Status: None   Collection Time: 12/20/24  8:42 PM   Specimen: BLOOD RIGHT HAND  Result Value Ref Range Status   Specimen Description BLOOD RIGHT HAND  Final   Special Requests   Final    BOTTLES DRAWN AEROBIC AND ANAEROBIC Blood Culture results may not be optimal due to an inadequate volume of blood received in culture bottles   Culture   Final    NO GROWTH 5 DAYS Performed at [facility], [address] [place], [place] [address]  Report Status 12/25/2024 FINAL  Final  Blood culture (routine x 2)     Status: None   Collection Time: 12/20/24  8:49 PM   Specimen: BLOOD  Result Value Ref Range Status   Specimen Description BLOOD LEFT ANTECUBITAL  Final   Special Requests   Final    BOTTLES DRAWN AEROBIC AND ANAEROBIC Blood Culture adequate volume   Culture   Final    NO GROWTH 5 DAYS Performed at [facility], [address], [place], [place] [address]    Report Status 12/25/2024 FINAL  Final  Aerobic/Anaerobic Culture w Gram Stain (surgical/deep wound)     Status: None (Preliminary result)   Collection Time: 12/22/24  1:20 PM   Specimen: Abscess  Result Value Ref Range Status   Specimen Description ABSCESS  Final   Special Requests INTERVERTEBRAL DISC  Final   Gram Stain   Final    ABUNDANT WBC PRESENT, PREDOMINANTLY PMN NO ORGANISMS SEEN    Culture   Final    NO GROWTH 3 DAYS NO ANAEROBES ISOLATED; CULTURE IN PROGRESS FOR 5 DAYS Performed at [facility], [address] [place], [place] [address]    Report Status PENDING  Incomplete         Radiology Studies: No results found.      Scheduled Meds:  amLODipine   5 mg Oral Daily   calcitRIOL   0.25 mcg Oral Q M,W,F-HD   carvedilol   6.25 mg Oral BID WC   Chlorhexidine  Gluconate Cloth  6 each Topical Q0600   darbepoetin (ARANESP ) injection - DIALYSIS  150 mcg Subcutaneous Q Wed-1800   feeding supplement (NEPRO CARB STEADY)  237 mL Oral BID BM   folic acid   1 mg Oral Daily   insulin  aspart  0-5 Units Subcutaneous QHS   insulin  aspart  0-9 Units Subcutaneous TID WC   insulin  aspart  3 Units Subcutaneous TID WC   insulin  glargine-yfgn  15 Units Subcutaneous QHS   levothyroxine   125 mcg Oral q AM   multivitamin  1 tablet Oral QHS   scopolamine   1 patch Transdermal Q72H   simvastatin   20 mg Oral QPM   sodium chloride  flush  3 mL Intravenous Q12H   Continuous Infusions:   sodium chloride  10 mL/hr at 12/25/24 2156   ceFEPime  (MAXIPIME ) IV 1 g (12/25/24 2202)   vancomycin  1,000 mg (12/25/24 2319)     LOS: 5 days    Time spent: 40 minutes    Toribio Hummer, MD [facility]   To contact the attending provider between 7A-7P or the covering provider during after hours 7P-7A, please log into the web site [email or web address] and access using universal [place] password for that web site. If you do not have the password, please call the hospital operator.  12/25/2024, 11:30 PM    "

## 2024-12-25 NOTE — Progress Notes (Signed)
 PT Cancellation Note  Patient Details Name: Brendan [name]. MRN: [id number] DOB: [birth year]   Cancelled Treatment:    Reason Eval/Treat Not Completed: Pain limiting ability to participate. Pt declines due to pain, he also reports that the doctor told him bone fragments are pushing on a nerve and he should not mobilize. Attempted to contact Dr. Colon via secure chat but did not receive a response. PT will follow up as time allows.   Bernardino JINNY Ruth 12/25/2024, 5:24 PM

## 2024-12-25 NOTE — TOC Progression Note (Signed)
 Transition of Care (TOC) - Progression Note    Patient Details  Name: Brendan [name]. MRN: [id number] Date of Birth: 1956-06-27  Transition of [facility]) CM/SW Contact  Andrez JULIANNA George, RN Phone Number: 12/25/2024, 2:03 PM  Clinical Narrative:     Pt informed ICM that he may be having surgery this admit.  ICM following for discharge needs.   Expected Discharge Plan: OP Rehab Barriers to Discharge: Continued Medical Work up               Expected Discharge Plan and Services   Discharge Planning Services: CM Consult   Living arrangements for the past 2 months: Single Family Home                                       Social Drivers of Health (SDOH) Interventions SDOH Screenings   Food Insecurity: No Food Insecurity (12/21/2024)  Housing: Low Risk (12/21/2024)  Transportation Needs: No Transportation Needs (12/21/2024)  Utilities: Not At Risk (12/21/2024)  Recent Concern: Utilities - At Risk (11/12/2024)  Depression (PHQ2-9): Low Risk (12/16/2024)  Financial Resource Strain: Low Risk (01/08/2023)  Social Connections: Socially Integrated (12/21/2024)  Tobacco Use: Low Risk (12/20/2024)    Readmission Risk Interventions    11/06/2024   11:47 AM 09/01/2024   11:34 AM  Readmission Risk Prevention Plan  Transportation Screening Complete Complete  Home Care Screening  Complete  Medication Review (RN CM)  Referral to Pharmacy  HRI or Home Care Consult Complete   Social Work Consult for Recovery Care Planning/Counseling Complete   Palliative Care Screening Not Applicable   Medication Review Oceanographer) Complete

## 2024-12-25 NOTE — Progress Notes (Signed)
 PT Cancellation Note  Patient Details Name: Brendan [name]. MRN: [id number] DOB: 14-Mar-1956   Cancelled Treatment:    Reason Eval/Treat Not Completed: Patient at procedure or test/unavailable (Pt in HD.  Will return as able.)   Stephane JULIANNA Bevel 12/25/2024, 8:19 AM Fed Ceci M,PT Acute Rehab Services [phone]

## 2024-12-29 ENCOUNTER — Ambulatory Visit

## 2024-12-29 ENCOUNTER — Ambulatory Visit: Admitting: Occupational Therapy

## 2024-12-31 ENCOUNTER — Ambulatory Visit: Admitting: Occupational Therapy

## 2024-12-31 ENCOUNTER — Ambulatory Visit

## 2025-01-04 ENCOUNTER — Ambulatory Visit (HOSPITAL_COMMUNITY)

## 2025-01-05 ENCOUNTER — Ambulatory Visit: Admitting: Physical Therapy

## 2025-01-05 ENCOUNTER — Ambulatory Visit: Admitting: Occupational Therapy

## 2025-01-07 ENCOUNTER — Ambulatory Visit: Admitting: Occupational Therapy

## 2025-01-07 ENCOUNTER — Ambulatory Visit: Admitting: Physical Therapy

## 2025-01-12 ENCOUNTER — Ambulatory Visit: Admitting: Physical Therapy

## 2025-01-12 ENCOUNTER — Ambulatory Visit: Admitting: Occupational Therapy

## 2025-01-14 ENCOUNTER — Ambulatory Visit: Admitting: Occupational Therapy

## 2025-01-14 ENCOUNTER — Ambulatory Visit: Admitting: Physical Therapy

## 2025-01-19 ENCOUNTER — Ambulatory Visit: Admitting: Occupational Therapy

## 2025-01-19 ENCOUNTER — Ambulatory Visit: Admitting: Physical Therapy

## 2025-01-20 ENCOUNTER — Encounter: Admitting: Physical Medicine and Rehabilitation

## 2025-01-21 ENCOUNTER — Ambulatory Visit: Admitting: Occupational Therapy

## 2025-01-21 ENCOUNTER — Ambulatory Visit: Admitting: Physical Therapy

## 2025-01-26 ENCOUNTER — Ambulatory Visit: Admitting: Occupational Therapy

## 2025-01-26 ENCOUNTER — Ambulatory Visit: Admitting: Physical Therapy

## 2025-01-28 ENCOUNTER — Ambulatory Visit: Admitting: Physical Therapy

## 2025-01-28 ENCOUNTER — Ambulatory Visit: Admitting: Occupational Therapy

## 2025-02-02 ENCOUNTER — Ambulatory Visit: Admitting: Physical Therapy

## 2025-02-02 ENCOUNTER — Ambulatory Visit: Admitting: Occupational Therapy

## 2025-02-04 ENCOUNTER — Ambulatory Visit: Admitting: Occupational Therapy

## 2025-02-04 ENCOUNTER — Ambulatory Visit: Admitting: Physical Therapy

## 2025-02-09 ENCOUNTER — Ambulatory Visit

## 2025-02-09 ENCOUNTER — Ambulatory Visit: Admitting: Occupational Therapy

## 2025-02-11 ENCOUNTER — Ambulatory Visit: Admitting: Occupational Therapy

## 2025-02-11 ENCOUNTER — Ambulatory Visit
